# Patient Record
Sex: Male | Born: 1955 | Race: White | Hispanic: No | Marital: Married | Smoking: Never smoker
Health system: Southern US, Community
[De-identification: ages and names within clinical notes are randomized; demographics above are authoritative.]

## PROBLEM LIST (undated history)

## (undated) DIAGNOSIS — I712 Thoracic aortic aneurysm, without rupture: Secondary | ICD-10-CM

## (undated) DIAGNOSIS — Z85850 Personal history of malignant neoplasm of thyroid: Secondary | ICD-10-CM

## (undated) DIAGNOSIS — H9190 Unspecified hearing loss, unspecified ear: Secondary | ICD-10-CM

## (undated) DIAGNOSIS — I739 Peripheral vascular disease, unspecified: Secondary | ICD-10-CM

## (undated) DIAGNOSIS — C61 Malignant neoplasm of prostate: Secondary | ICD-10-CM

## (undated) DIAGNOSIS — H18069 Stromal corneal pigmentations, unspecified eye: Secondary | ICD-10-CM

## (undated) DIAGNOSIS — D839 Common variable immunodeficiency, unspecified: Secondary | ICD-10-CM

## (undated) DIAGNOSIS — N401 Enlarged prostate with lower urinary tract symptoms: Secondary | ICD-10-CM

## (undated) DIAGNOSIS — Z973 Presence of spectacles and contact lenses: Secondary | ICD-10-CM

## (undated) DIAGNOSIS — H3530 Unspecified macular degeneration: Secondary | ICD-10-CM

## (undated) DIAGNOSIS — G5620 Lesion of ulnar nerve, unspecified upper limb: Secondary | ICD-10-CM

## (undated) DIAGNOSIS — C801 Malignant (primary) neoplasm, unspecified: Secondary | ICD-10-CM

## (undated) DIAGNOSIS — I2510 Atherosclerotic heart disease of native coronary artery without angina pectoris: Secondary | ICD-10-CM

## (undated) DIAGNOSIS — K7689 Other specified diseases of liver: Secondary | ICD-10-CM

## (undated) DIAGNOSIS — E785 Hyperlipidemia, unspecified: Secondary | ICD-10-CM

## (undated) DIAGNOSIS — R351 Nocturia: Secondary | ICD-10-CM

## (undated) DIAGNOSIS — H269 Unspecified cataract: Secondary | ICD-10-CM

## (undated) DIAGNOSIS — D649 Anemia, unspecified: Secondary | ICD-10-CM

## (undated) DIAGNOSIS — T4145XA Adverse effect of unspecified anesthetic, initial encounter: Secondary | ICD-10-CM

## (undated) DIAGNOSIS — Z8709 Personal history of other diseases of the respiratory system: Secondary | ICD-10-CM

## (undated) DIAGNOSIS — R3911 Hesitancy of micturition: Secondary | ICD-10-CM

## (undated) DIAGNOSIS — M1990 Unspecified osteoarthritis, unspecified site: Secondary | ICD-10-CM

## (undated) DIAGNOSIS — Z85840 Personal history of malignant neoplasm of eye: Secondary | ICD-10-CM

## (undated) DIAGNOSIS — Z87442 Personal history of urinary calculi: Secondary | ICD-10-CM

## (undated) DIAGNOSIS — S52123A Displaced fracture of head of unspecified radius, initial encounter for closed fracture: Secondary | ICD-10-CM

## (undated) DIAGNOSIS — Z87438 Personal history of other diseases of male genital organs: Secondary | ICD-10-CM

## (undated) DIAGNOSIS — R2681 Unsteadiness on feet: Secondary | ICD-10-CM

## (undated) DIAGNOSIS — H409 Unspecified glaucoma: Secondary | ICD-10-CM

## (undated) DIAGNOSIS — F319 Bipolar disorder, unspecified: Secondary | ICD-10-CM

## (undated) DIAGNOSIS — I10 Essential (primary) hypertension: Secondary | ICD-10-CM

## (undated) DIAGNOSIS — K219 Gastro-esophageal reflux disease without esophagitis: Secondary | ICD-10-CM

## (undated) DIAGNOSIS — IMO0002 Reserved for concepts with insufficient information to code with codable children: Secondary | ICD-10-CM

## (undated) DIAGNOSIS — F419 Anxiety disorder, unspecified: Secondary | ICD-10-CM

## (undated) DIAGNOSIS — H9319 Tinnitus, unspecified ear: Secondary | ICD-10-CM

## (undated) DIAGNOSIS — T8859XA Other complications of anesthesia, initial encounter: Secondary | ICD-10-CM

## (undated) DIAGNOSIS — G8929 Other chronic pain: Secondary | ICD-10-CM

## (undated) DIAGNOSIS — M549 Dorsalgia, unspecified: Secondary | ICD-10-CM

## (undated) DIAGNOSIS — Z8719 Personal history of other diseases of the digestive system: Secondary | ICD-10-CM

## (undated) DIAGNOSIS — K862 Cyst of pancreas: Secondary | ICD-10-CM

## (undated) DIAGNOSIS — N138 Other obstructive and reflux uropathy: Secondary | ICD-10-CM

## (undated) DIAGNOSIS — F329 Major depressive disorder, single episode, unspecified: Secondary | ICD-10-CM

## (undated) DIAGNOSIS — F32A Depression, unspecified: Secondary | ICD-10-CM

## (undated) DIAGNOSIS — K274 Chronic or unspecified peptic ulcer, site unspecified, with hemorrhage: Secondary | ICD-10-CM

## (undated) HISTORY — DX: Malignant (primary) neoplasm, unspecified: C80.1

## (undated) HISTORY — DX: Anxiety disorder, unspecified: F41.9

## (undated) HISTORY — DX: Depression, unspecified: F32.A

## (undated) HISTORY — DX: Chronic or unspecified peptic ulcer, site unspecified, with hemorrhage: K27.4

## (undated) HISTORY — PX: CATARACT EXTRACTION: SUR2

## (undated) HISTORY — DX: Common variable immunodeficiency, unspecified: D83.9

## (undated) HISTORY — DX: Major depressive disorder, single episode, unspecified: F32.9

## (undated) HISTORY — DX: Hyperlipidemia, unspecified: E78.5

## (undated) HISTORY — PX: CARDIAC CATHETERIZATION: SHX172

## (undated) HISTORY — DX: Essential (primary) hypertension: I10

## (undated) HISTORY — PX: OTHER SURGICAL HISTORY: SHX169

## (undated) HISTORY — PX: SHOULDER ARTHROSCOPY: SHX128

## (undated) HISTORY — DX: Gastro-esophageal reflux disease without esophagitis: K21.9

---

## 1997-04-12 DIAGNOSIS — D229 Melanocytic nevi, unspecified: Secondary | ICD-10-CM

## 1997-04-12 HISTORY — DX: Melanocytic nevi, unspecified: D22.9

## 1999-01-19 ENCOUNTER — Observation Stay (HOSPITAL_COMMUNITY): Admission: EM | Admit: 1999-01-19 | Discharge: 1999-01-20 | Payer: Self-pay | Admitting: Emergency Medicine

## 1999-01-19 ENCOUNTER — Encounter: Payer: Self-pay | Admitting: Urology

## 1999-06-07 ENCOUNTER — Encounter: Payer: Self-pay | Admitting: Gastroenterology

## 1999-06-07 ENCOUNTER — Ambulatory Visit (HOSPITAL_COMMUNITY): Admission: RE | Admit: 1999-06-07 | Discharge: 1999-06-07 | Payer: Self-pay | Admitting: Gastroenterology

## 2000-08-12 HISTORY — PX: MOHS SURGERY: SUR867

## 2000-08-12 HISTORY — PX: OTHER SURGICAL HISTORY: SHX169

## 2001-07-23 ENCOUNTER — Encounter: Payer: Self-pay | Admitting: Oncology

## 2001-07-23 ENCOUNTER — Ambulatory Visit (HOSPITAL_COMMUNITY): Admission: RE | Admit: 2001-07-23 | Discharge: 2001-07-23 | Payer: Self-pay | Admitting: Oncology

## 2001-07-23 ENCOUNTER — Encounter (INDEPENDENT_AMBULATORY_CARE_PROVIDER_SITE_OTHER): Payer: Self-pay | Admitting: *Deleted

## 2001-07-24 ENCOUNTER — Encounter: Payer: Self-pay | Admitting: Oncology

## 2001-07-24 ENCOUNTER — Ambulatory Visit (HOSPITAL_COMMUNITY): Admission: RE | Admit: 2001-07-24 | Discharge: 2001-07-24 | Payer: Self-pay | Admitting: Oncology

## 2001-07-28 ENCOUNTER — Ambulatory Visit: Admission: RE | Admit: 2001-07-28 | Discharge: 2001-08-11 | Payer: Self-pay | Admitting: Radiation Oncology

## 2001-08-12 ENCOUNTER — Ambulatory Visit: Admission: RE | Admit: 2001-08-12 | Discharge: 2001-11-10 | Payer: Self-pay | Admitting: Radiation Oncology

## 2001-10-30 ENCOUNTER — Encounter: Payer: Self-pay | Admitting: Surgery

## 2001-11-03 ENCOUNTER — Encounter (INDEPENDENT_AMBULATORY_CARE_PROVIDER_SITE_OTHER): Payer: Self-pay | Admitting: Specialist

## 2001-11-03 HISTORY — PX: TOTAL THYROIDECTOMY: SHX2547

## 2001-11-04 ENCOUNTER — Inpatient Hospital Stay (HOSPITAL_COMMUNITY): Admission: RE | Admit: 2001-11-04 | Discharge: 2001-11-05 | Payer: Self-pay | Admitting: Surgery

## 2001-11-30 ENCOUNTER — Encounter: Payer: Self-pay | Admitting: Endocrinology

## 2001-11-30 ENCOUNTER — Encounter (HOSPITAL_COMMUNITY): Admission: RE | Admit: 2001-11-30 | Discharge: 2002-02-28 | Payer: Self-pay | Admitting: Endocrinology

## 2001-12-10 ENCOUNTER — Encounter: Payer: Self-pay | Admitting: Endocrinology

## 2003-02-22 ENCOUNTER — Other Ambulatory Visit: Admission: RE | Admit: 2003-02-22 | Discharge: 2003-02-22 | Payer: Self-pay | Admitting: Otolaryngology

## 2003-03-08 ENCOUNTER — Inpatient Hospital Stay (HOSPITAL_COMMUNITY): Admission: RE | Admit: 2003-03-08 | Discharge: 2003-03-10 | Payer: Self-pay | Admitting: Otolaryngology

## 2003-03-08 ENCOUNTER — Encounter (INDEPENDENT_AMBULATORY_CARE_PROVIDER_SITE_OTHER): Payer: Self-pay | Admitting: *Deleted

## 2003-03-08 HISTORY — PX: OTHER SURGICAL HISTORY: SHX169

## 2003-03-16 ENCOUNTER — Ambulatory Visit: Admission: RE | Admit: 2003-03-16 | Discharge: 2003-06-14 | Payer: Self-pay | Admitting: Radiation Oncology

## 2003-04-04 ENCOUNTER — Encounter: Admission: RE | Admit: 2003-04-04 | Discharge: 2003-04-04 | Payer: Self-pay | Admitting: Dentistry

## 2003-04-22 ENCOUNTER — Ambulatory Visit (HOSPITAL_COMMUNITY): Admission: RE | Admit: 2003-04-22 | Discharge: 2003-04-22 | Payer: Self-pay | Admitting: Radiation Oncology

## 2003-05-10 ENCOUNTER — Ambulatory Visit (HOSPITAL_COMMUNITY): Admission: RE | Admit: 2003-05-10 | Discharge: 2003-05-10 | Payer: Self-pay | Admitting: Radiation Oncology

## 2003-05-30 ENCOUNTER — Inpatient Hospital Stay (HOSPITAL_COMMUNITY): Admission: EM | Admit: 2003-05-30 | Discharge: 2003-06-10 | Payer: Self-pay | Admitting: Emergency Medicine

## 2003-05-30 ENCOUNTER — Encounter: Payer: Self-pay | Admitting: Emergency Medicine

## 2003-06-17 ENCOUNTER — Ambulatory Visit: Admission: RE | Admit: 2003-06-17 | Discharge: 2003-06-17 | Payer: Self-pay | Admitting: Radiation Oncology

## 2003-06-24 ENCOUNTER — Ambulatory Visit: Admission: RE | Admit: 2003-06-24 | Discharge: 2003-06-24 | Payer: Self-pay | Admitting: Radiation Oncology

## 2003-07-04 ENCOUNTER — Ambulatory Visit: Admission: RE | Admit: 2003-07-04 | Discharge: 2003-07-04 | Payer: Self-pay | Admitting: Radiation Oncology

## 2003-07-05 ENCOUNTER — Ambulatory Visit: Admission: RE | Admit: 2003-07-05 | Discharge: 2003-07-05 | Payer: Self-pay | Admitting: Radiation Oncology

## 2003-07-18 ENCOUNTER — Ambulatory Visit: Admission: RE | Admit: 2003-07-18 | Discharge: 2003-07-18 | Payer: Self-pay | Admitting: Radiation Oncology

## 2003-07-25 ENCOUNTER — Inpatient Hospital Stay (HOSPITAL_COMMUNITY): Admission: RE | Admit: 2003-07-25 | Discharge: 2003-07-26 | Payer: Self-pay | Admitting: Psychiatry

## 2003-08-09 ENCOUNTER — Ambulatory Visit (HOSPITAL_COMMUNITY): Admission: RE | Admit: 2003-08-09 | Discharge: 2003-08-09 | Payer: Self-pay | Admitting: Oncology

## 2003-08-15 ENCOUNTER — Ambulatory Visit: Admission: RE | Admit: 2003-08-15 | Discharge: 2003-08-15 | Payer: Self-pay | Admitting: Radiation Oncology

## 2003-11-14 ENCOUNTER — Ambulatory Visit: Admission: RE | Admit: 2003-11-14 | Discharge: 2003-11-14 | Payer: Self-pay | Admitting: Radiation Oncology

## 2004-04-10 ENCOUNTER — Encounter: Admission: RE | Admit: 2004-04-10 | Discharge: 2004-04-10 | Payer: Self-pay | Admitting: Oncology

## 2004-06-17 ENCOUNTER — Ambulatory Visit: Payer: Self-pay | Admitting: Oncology

## 2004-08-12 HISTORY — PX: OTHER SURGICAL HISTORY: SHX169

## 2004-09-18 ENCOUNTER — Ambulatory Visit: Payer: Self-pay | Admitting: Internal Medicine

## 2004-09-25 ENCOUNTER — Observation Stay (HOSPITAL_COMMUNITY): Admission: EM | Admit: 2004-09-25 | Discharge: 2004-09-26 | Payer: Self-pay | Admitting: Orthopedic Surgery

## 2004-10-09 ENCOUNTER — Ambulatory Visit: Payer: Self-pay | Admitting: *Deleted

## 2004-10-17 ENCOUNTER — Ambulatory Visit: Payer: Self-pay

## 2004-10-17 ENCOUNTER — Ambulatory Visit: Payer: Self-pay | Admitting: *Deleted

## 2004-10-25 ENCOUNTER — Ambulatory Visit: Payer: Self-pay | Admitting: Oncology

## 2004-10-30 ENCOUNTER — Ambulatory Visit: Payer: Self-pay | Admitting: *Deleted

## 2004-11-06 ENCOUNTER — Ambulatory Visit (HOSPITAL_COMMUNITY): Admission: RE | Admit: 2004-11-06 | Discharge: 2004-11-07 | Payer: Self-pay | Admitting: Ophthalmology

## 2004-11-06 ENCOUNTER — Encounter (INDEPENDENT_AMBULATORY_CARE_PROVIDER_SITE_OTHER): Payer: Self-pay | Admitting: *Deleted

## 2004-11-06 HISTORY — PX: PARS PLANA VITRECTOMY: SHX2166

## 2004-11-29 ENCOUNTER — Ambulatory Visit: Payer: Self-pay | Admitting: Gastroenterology

## 2005-03-20 ENCOUNTER — Ambulatory Visit: Payer: Self-pay | Admitting: Oncology

## 2005-03-25 ENCOUNTER — Ambulatory Visit (HOSPITAL_COMMUNITY): Admission: RE | Admit: 2005-03-25 | Discharge: 2005-03-25 | Payer: Self-pay | Admitting: Otolaryngology

## 2005-03-29 ENCOUNTER — Ambulatory Visit (HOSPITAL_COMMUNITY): Admission: RE | Admit: 2005-03-29 | Discharge: 2005-03-30 | Payer: Self-pay | Admitting: Urology

## 2005-03-29 HISTORY — PX: OTHER SURGICAL HISTORY: SHX169

## 2005-05-28 ENCOUNTER — Ambulatory Visit: Payer: Self-pay | Admitting: Gastroenterology

## 2005-05-28 ENCOUNTER — Encounter (INDEPENDENT_AMBULATORY_CARE_PROVIDER_SITE_OTHER): Payer: Self-pay | Admitting: Specialist

## 2005-06-12 HISTORY — PX: SEPTOPLASTY: SUR1290

## 2005-07-08 ENCOUNTER — Ambulatory Visit: Payer: Self-pay | Admitting: Gastroenterology

## 2005-07-12 ENCOUNTER — Ambulatory Visit: Payer: Self-pay | Admitting: Oncology

## 2005-07-18 ENCOUNTER — Ambulatory Visit: Payer: Self-pay | Admitting: Gastroenterology

## 2005-08-02 ENCOUNTER — Ambulatory Visit: Payer: Self-pay | Admitting: Internal Medicine

## 2005-08-07 ENCOUNTER — Encounter (INDEPENDENT_AMBULATORY_CARE_PROVIDER_SITE_OTHER): Payer: Self-pay | Admitting: Specialist

## 2005-08-07 ENCOUNTER — Observation Stay (HOSPITAL_COMMUNITY): Admission: EM | Admit: 2005-08-07 | Discharge: 2005-08-07 | Payer: Self-pay | Admitting: Emergency Medicine

## 2005-08-07 HISTORY — PX: NASAL ENDOSCOPY: SHX286

## 2005-10-30 ENCOUNTER — Ambulatory Visit: Payer: Self-pay | Admitting: Internal Medicine

## 2005-11-04 ENCOUNTER — Ambulatory Visit: Payer: Self-pay | Admitting: Internal Medicine

## 2006-01-08 ENCOUNTER — Ambulatory Visit: Payer: Self-pay | Admitting: Oncology

## 2006-01-08 LAB — COMPREHENSIVE METABOLIC PANEL
ALT: 31 U/L (ref 0–40)
AST: 18 U/L (ref 0–37)
Albumin: 4.3 g/dL (ref 3.5–5.2)
Alkaline Phosphatase: 49 U/L (ref 39–117)
BUN: 21 mg/dL (ref 6–23)
CO2: 26 mEq/L (ref 19–32)
Calcium: 8.2 mg/dL — ABNORMAL LOW (ref 8.4–10.5)
Chloride: 101 mEq/L (ref 96–112)
Creatinine, Ser: 1.1 mg/dL (ref 0.4–1.5)
Glucose, Bld: 93 mg/dL (ref 70–99)
Potassium: 3.8 mEq/L (ref 3.5–5.3)
Sodium: 138 mEq/L (ref 135–145)
Total Bilirubin: 0.8 mg/dL (ref 0.3–1.2)
Total Protein: 6.9 g/dL (ref 6.0–8.3)

## 2006-01-08 LAB — CBC WITH DIFFERENTIAL/PLATELET
BASO%: 0.4 % (ref 0.0–2.0)
Basophils Absolute: 0 10*3/uL (ref 0.0–0.1)
EOS%: 0.5 % (ref 0.0–7.0)
Eosinophils Absolute: 0.1 10*3/uL (ref 0.0–0.5)
HCT: 49.4 % (ref 38.7–49.9)
HGB: 16.6 g/dL (ref 13.0–17.1)
LYMPH%: 15.5 % (ref 14.0–48.0)
MCH: 26.6 pg — ABNORMAL LOW (ref 28.0–33.4)
MCHC: 33.5 g/dL (ref 32.0–35.9)
MCV: 79.4 fL — ABNORMAL LOW (ref 81.6–98.0)
MONO#: 0.7 10*3/uL (ref 0.1–0.9)
MONO%: 6.4 % (ref 0.0–13.0)
NEUT#: 8.6 10*3/uL — ABNORMAL HIGH (ref 1.5–6.5)
NEUT%: 77.2 % — ABNORMAL HIGH (ref 40.0–75.0)
Platelets: 281 10*3/uL (ref 145–400)
RBC: 6.22 10*6/uL — ABNORMAL HIGH (ref 4.20–5.71)
RDW: 17 % — ABNORMAL HIGH (ref 11.2–14.6)
WBC: 11.1 10*3/uL — ABNORMAL HIGH (ref 4.0–10.0)
lymph#: 1.7 10*3/uL (ref 0.9–3.3)

## 2006-01-08 LAB — LIPID PANEL
Cholesterol: 248 mg/dL — ABNORMAL HIGH (ref 0–200)
HDL: 70 mg/dL (ref >39)
LDL Cholesterol: 154 mg/dL — ABNORMAL HIGH (ref 0–99)
Total CHOL/HDL Ratio: 3.5 Ratio
Triglycerides: 121 mg/dL (ref <150)
VLDL: 24 mg/dL (ref 0–40)

## 2006-01-08 LAB — TSH: TSH: 0.461 u[IU]/mL (ref 0.350–5.500)

## 2006-01-08 LAB — LACTATE DEHYDROGENASE: LDH: 216 U/L (ref 94–250)

## 2006-01-08 LAB — TESTOSTERONE: Testosterone: 591.84 ng/dL (ref 350–890)

## 2006-01-09 ENCOUNTER — Ambulatory Visit (HOSPITAL_COMMUNITY): Admission: RE | Admit: 2006-01-09 | Discharge: 2006-01-09 | Payer: Self-pay | Admitting: Oncology

## 2006-01-15 ENCOUNTER — Inpatient Hospital Stay (HOSPITAL_COMMUNITY): Admission: EM | Admit: 2006-01-15 | Discharge: 2006-01-16 | Payer: Self-pay | Admitting: Emergency Medicine

## 2006-01-15 ENCOUNTER — Ambulatory Visit: Payer: Self-pay | Admitting: Cardiology

## 2006-01-15 ENCOUNTER — Ambulatory Visit: Payer: Self-pay | Admitting: Gastroenterology

## 2006-01-30 ENCOUNTER — Ambulatory Visit: Payer: Self-pay | Admitting: Gastroenterology

## 2006-02-05 ENCOUNTER — Ambulatory Visit: Payer: Self-pay | Admitting: *Deleted

## 2006-02-24 ENCOUNTER — Ambulatory Visit: Payer: Self-pay | Admitting: Internal Medicine

## 2006-02-25 ENCOUNTER — Ambulatory Visit: Payer: Self-pay | Admitting: *Deleted

## 2006-04-02 ENCOUNTER — Ambulatory Visit: Payer: Self-pay | Admitting: Internal Medicine

## 2006-06-03 ENCOUNTER — Ambulatory Visit: Payer: Self-pay | Admitting: *Deleted

## 2006-06-04 ENCOUNTER — Ambulatory Visit: Payer: Self-pay | Admitting: *Deleted

## 2006-06-04 LAB — CONVERTED CEMR LAB
ALT: 28 units/L (ref 0–40)
AST: 27 units/L (ref 0–37)
Albumin: 3.9 g/dL (ref 3.5–5.2)
Alkaline Phosphatase: 54 units/L (ref 39–117)
BUN: 14 mg/dL (ref 6–23)
Bilirubin, Direct: 0.2 mg/dL (ref 0.0–0.3)
CO2: 29 meq/L (ref 19–32)
Calcium: 8.8 mg/dL (ref 8.4–10.5)
Chloride: 103 meq/L (ref 96–112)
Chol/HDL Ratio, serum: 3.4
Cholesterol: 178 mg/dL (ref 0–200)
Creatinine, Ser: 1.2 mg/dL (ref 0.4–1.5)
GFR calc non Af Amer: 68 mL/min
Glomerular Filtration Rate, Af Am: 82 mL/min/{1.73_m2}
Glucose, Bld: 95 mg/dL (ref 70–99)
HDL: 52.4 mg/dL (ref >39.0)
LDL Cholesterol: 107 mg/dL — ABNORMAL HIGH (ref 0–99)
Potassium: 4.3 meq/L (ref 3.5–5.1)
Sodium: 138 meq/L (ref 135–145)
TSH: 1.3 microintl units/mL (ref 0.35–5.50)
Testosterone, total: 4.3027 ng/mL
Total Bilirubin: 0.9 mg/dL (ref 0.3–1.2)
Total Protein: 6.8 g/dL (ref 6.0–8.3)
Triglyceride fasting, serum: 95 mg/dL (ref 0–149)
VLDL: 19 mg/dL (ref 0–40)

## 2006-07-07 ENCOUNTER — Ambulatory Visit: Payer: Self-pay | Admitting: Oncology

## 2006-07-16 LAB — COMPREHENSIVE METABOLIC PANEL
ALT: 31 U/L (ref 0–53)
AST: 23 U/L (ref 0–37)
Albumin: 4.5 g/dL (ref 3.5–5.2)
Alkaline Phosphatase: 63 U/L (ref 39–117)
BUN: 21 mg/dL (ref 6–23)
CO2: 25 mEq/L (ref 19–32)
Calcium: 9.2 mg/dL (ref 8.4–10.5)
Chloride: 105 mEq/L (ref 96–112)
Creatinine, Ser: 1.12 mg/dL (ref 0.40–1.50)
Glucose, Bld: 105 mg/dL — ABNORMAL HIGH (ref 70–99)
Potassium: 4.2 mEq/L (ref 3.5–5.3)
Sodium: 140 mEq/L (ref 135–145)
Total Bilirubin: 0.6 mg/dL (ref 0.3–1.2)
Total Protein: 7 g/dL (ref 6.0–8.3)

## 2006-07-16 LAB — CBC WITH DIFFERENTIAL/PLATELET
BASO%: 0.2 % (ref 0.0–2.0)
Basophils Absolute: 0 10*3/uL (ref 0.0–0.1)
EOS%: 3.6 % (ref 0.0–7.0)
Eosinophils Absolute: 0.3 10*3/uL (ref 0.0–0.5)
HCT: 48.9 % (ref 38.7–49.9)
HGB: 16.5 g/dL (ref 13.0–17.1)
LYMPH%: 19.6 % (ref 14.0–48.0)
MCH: 27.4 pg — ABNORMAL LOW (ref 28.0–33.4)
MCHC: 33.7 g/dL (ref 32.0–35.9)
MCV: 81.2 fL — ABNORMAL LOW (ref 81.6–98.0)
MONO#: 0.9 10*3/uL (ref 0.1–0.9)
MONO%: 9.9 % (ref 0.0–13.0)
NEUT#: 5.8 10*3/uL (ref 1.5–6.5)
NEUT%: 66.7 % (ref 40.0–75.0)
Platelets: 348 10*3/uL (ref 145–400)
RBC: 6.03 10*6/uL — ABNORMAL HIGH (ref 4.20–5.71)
RDW: 14.7 % — ABNORMAL HIGH (ref 11.2–14.6)
WBC: 8.7 10*3/uL (ref 4.0–10.0)
lymph#: 1.7 10*3/uL (ref 0.9–3.3)

## 2006-07-16 LAB — TESTOSTERONE: Testosterone: 208.32 ng/dL — ABNORMAL LOW (ref 350–890)

## 2006-07-16 LAB — TSH: TSH: 0.739 u[IU]/mL (ref 0.350–5.500)

## 2006-10-13 ENCOUNTER — Ambulatory Visit: Payer: Self-pay | Admitting: Internal Medicine

## 2006-10-13 LAB — CONVERTED CEMR LAB
ALT: 40 units/L (ref 0–40)
AST: 36 units/L (ref 0–37)
Albumin: 4 g/dL (ref 3.5–5.2)
Alkaline Phosphatase: 58 units/L (ref 39–117)
BUN: 13 mg/dL (ref 6–23)
Basophils Absolute: 0.1 10*3/uL (ref 0.0–0.1)
Basophils Relative: 0.9 % (ref 0.0–1.0)
Bilirubin Urine: NEGATIVE
Bilirubin, Direct: 0.2 mg/dL (ref 0.0–0.3)
CO2: 30 meq/L (ref 19–32)
Calcium: 8.7 mg/dL (ref 8.4–10.5)
Chloride: 106 meq/L (ref 96–112)
Cholesterol: 176 mg/dL (ref 0–200)
Creatinine, Ser: 1 mg/dL (ref 0.4–1.5)
Eosinophils Absolute: 0.4 10*3/uL (ref 0.0–0.6)
Eosinophils Relative: 5 % (ref 0.0–5.0)
GFR calc Af Amer: 101 mL/min
GFR calc non Af Amer: 83 mL/min
Glucose, Bld: 99 mg/dL (ref 70–99)
HCT: 51.1 % (ref 39.0–52.0)
HDL: 55.8 mg/dL (ref >39.0)
Hemoglobin: 16.8 g/dL (ref 13.0–17.0)
Ketones, ur: NEGATIVE mg/dL
LDL Cholesterol: 99 mg/dL (ref 0–99)
Leukocytes, UA: NEGATIVE
Lymphocytes Relative: 23.1 % (ref 12.0–46.0)
MCHC: 32.9 g/dL (ref 30.0–36.0)
MCV: 81.7 fL (ref 78.0–100.0)
Monocytes Absolute: 1.1 10*3/uL — ABNORMAL HIGH (ref 0.2–0.7)
Monocytes Relative: 12 % — ABNORMAL HIGH (ref 3.0–11.0)
Neutro Abs: 5.2 10*3/uL (ref 1.4–7.7)
Neutrophils Relative %: 59 % (ref 43.0–77.0)
Nitrite: NEGATIVE
PSA: 4.08 ng/mL — ABNORMAL HIGH (ref 0.10–4.00)
Platelets: 308 10*3/uL (ref 150–400)
Potassium: 4.3 meq/L (ref 3.5–5.1)
RBC: 6.25 M/uL — ABNORMAL HIGH (ref 4.22–5.81)
RDW: 15.9 % — ABNORMAL HIGH (ref 11.5–14.6)
Sodium: 142 meq/L (ref 135–145)
Specific Gravity, Urine: >=1.03 (ref 1.000–1.03)
TSH: 2.42 microintl units/mL (ref 0.35–5.50)
Testosterone: 310.79 ng/dL — ABNORMAL LOW (ref 350.00–890)
Total Bilirubin: 0.9 mg/dL (ref 0.3–1.2)
Total CHOL/HDL Ratio: 3.2
Total Protein, Urine: NEGATIVE mg/dL
Total Protein: 7 g/dL (ref 6.0–8.3)
Triglycerides: 106 mg/dL (ref 0–149)
Urine Glucose: NEGATIVE mg/dL
Urobilinogen, UA: 0.2 (ref 0.0–1.0)
VLDL: 21 mg/dL (ref 0–40)
WBC: 8.8 10*3/uL (ref 4.5–10.5)
pH: 6 (ref 5.0–8.0)

## 2006-10-20 ENCOUNTER — Ambulatory Visit: Payer: Self-pay | Admitting: Internal Medicine

## 2006-10-20 LAB — CONVERTED CEMR LAB
Rhuematoid fact SerPl-aCnc: <20 intl units/mL — ABNORMAL LOW (ref 0.0–20.0)
Sed Rate: 1 mm/hr (ref 0–20)
Uric Acid, Serum: 6 mg/dL (ref 2.4–7.0)

## 2006-10-22 ENCOUNTER — Ambulatory Visit: Payer: Self-pay | Admitting: Oncology

## 2006-10-30 ENCOUNTER — Ambulatory Visit (HOSPITAL_COMMUNITY): Admission: RE | Admit: 2006-10-30 | Discharge: 2006-10-30 | Payer: Self-pay | Admitting: Oncology

## 2007-01-02 ENCOUNTER — Ambulatory Visit: Payer: Self-pay | Admitting: Oncology

## 2007-01-21 ENCOUNTER — Emergency Department (HOSPITAL_COMMUNITY): Admission: EM | Admit: 2007-01-21 | Discharge: 2007-01-21 | Payer: Self-pay | Admitting: Emergency Medicine

## 2007-01-26 ENCOUNTER — Encounter: Admission: RE | Admit: 2007-01-26 | Discharge: 2007-01-26 | Payer: Self-pay | Admitting: Orthopedic Surgery

## 2007-02-03 LAB — CBC WITH DIFFERENTIAL/PLATELET
BASO%: 0.4 % (ref 0.0–2.0)
Basophils Absolute: 0 10*3/uL (ref 0.0–0.1)
EOS%: 1.6 % (ref 0.0–7.0)
Eosinophils Absolute: 0.2 10*3/uL (ref 0.0–0.5)
HCT: 49.1 % (ref 38.7–49.9)
HGB: 16.6 g/dL (ref 13.0–17.1)
LYMPH%: 16.7 % (ref 14.0–48.0)
MCH: 27.6 pg — ABNORMAL LOW (ref 28.0–33.4)
MCHC: 33.7 g/dL (ref 32.0–35.9)
MCV: 81.9 fL (ref 81.6–98.0)
MONO#: 0.8 10*3/uL (ref 0.1–0.9)
MONO%: 8.4 % (ref 0.0–13.0)
NEUT#: 6.9 10*3/uL — ABNORMAL HIGH (ref 1.5–6.5)
NEUT%: 72.9 % (ref 40.0–75.0)
Platelets: 285 10*3/uL (ref 145–400)
RBC: 6 10*6/uL — ABNORMAL HIGH (ref 4.20–5.71)
RDW: 15.5 % — ABNORMAL HIGH (ref 11.2–14.6)
WBC: 9.5 10*3/uL (ref 4.0–10.0)
lymph#: 1.6 10*3/uL (ref 0.9–3.3)

## 2007-02-03 LAB — TESTOSTERONE: Testosterone: 971.68 ng/dL — ABNORMAL HIGH (ref 350–890)

## 2007-02-03 LAB — TSH: TSH: 2.851 u[IU]/mL (ref 0.350–5.500)

## 2007-03-05 ENCOUNTER — Ambulatory Visit: Payer: Self-pay | Admitting: Cardiology

## 2007-03-05 LAB — CONVERTED CEMR LAB
ALT: 43 units/L (ref 0–53)
AST: 28 units/L (ref 0–37)
Albumin: 3.9 g/dL (ref 3.5–5.2)
Alkaline Phosphatase: 51 units/L (ref 39–117)
Bilirubin, Direct: 0.1 mg/dL (ref 0.0–0.3)
Cholesterol: 212 mg/dL (ref 0–200)
Direct LDL: 134.7 mg/dL
HDL: 54.4 mg/dL (ref >39.0)
Total Bilirubin: 0.9 mg/dL (ref 0.3–1.2)
Total CHOL/HDL Ratio: 3.9
Total Protein: 6.5 g/dL (ref 6.0–8.3)
Triglycerides: 74 mg/dL (ref 0–149)
VLDL: 15 mg/dL (ref 0–40)

## 2007-03-10 ENCOUNTER — Ambulatory Visit: Payer: Self-pay | Admitting: Cardiology

## 2007-03-20 ENCOUNTER — Ambulatory Visit: Payer: Self-pay | Admitting: Internal Medicine

## 2007-04-07 ENCOUNTER — Ambulatory Visit: Payer: Self-pay | Admitting: Oncology

## 2007-04-09 LAB — COMPREHENSIVE METABOLIC PANEL
ALT: 24 U/L (ref 0–53)
AST: 20 U/L (ref 0–37)
Albumin: 4.5 g/dL (ref 3.5–5.2)
Alkaline Phosphatase: 52 U/L (ref 39–117)
BUN: 18 mg/dL (ref 6–23)
CO2: 25 mEq/L (ref 19–32)
Calcium: 8.4 mg/dL (ref 8.4–10.5)
Chloride: 103 mEq/L (ref 96–112)
Creatinine, Ser: 1.07 mg/dL (ref 0.40–1.50)
Glucose, Bld: 90 mg/dL (ref 70–99)
Potassium: 4.2 mEq/L (ref 3.5–5.3)
Sodium: 138 mEq/L (ref 135–145)
Total Bilirubin: 0.9 mg/dL (ref 0.3–1.2)
Total Protein: 6.7 g/dL (ref 6.0–8.3)

## 2007-04-09 LAB — MORPHOLOGY: PLT EST: ADEQUATE

## 2007-04-09 LAB — TESTOSTERONE: Testosterone: 608.05 ng/dL (ref 350–890)

## 2007-04-09 LAB — CBC WITH DIFFERENTIAL/PLATELET
BASO%: 0.3 % (ref 0.0–2.0)
Basophils Absolute: 0 10*3/uL (ref 0.0–0.1)
EOS%: 1.3 % (ref 0.0–7.0)
Eosinophils Absolute: 0.1 10*3/uL (ref 0.0–0.5)
HCT: 44.2 % (ref 38.7–49.9)
HGB: 15.1 g/dL (ref 13.0–17.1)
LYMPH%: 23.2 % (ref 14.0–48.0)
MCH: 27.8 pg — ABNORMAL LOW (ref 28.0–33.4)
MCHC: 34.1 g/dL (ref 32.0–35.9)
MCV: 81.6 fL (ref 81.6–98.0)
MONO#: 0.8 10*3/uL (ref 0.1–0.9)
MONO%: 11.9 % (ref 0.0–13.0)
NEUT#: 4.5 10*3/uL (ref 1.5–6.5)
NEUT%: 63.3 % (ref 40.0–75.0)
Platelets: 292 10*3/uL (ref 145–400)
RBC: 5.41 10*6/uL (ref 4.20–5.71)
RDW: 16.9 % — ABNORMAL HIGH (ref 11.2–14.6)
WBC: 7.1 10*3/uL (ref 4.0–10.0)
lymph#: 1.7 10*3/uL (ref 0.9–3.3)

## 2007-04-09 LAB — TSH: TSH: 2.447 u[IU]/mL (ref 0.350–5.500)

## 2007-04-20 ENCOUNTER — Ambulatory Visit: Payer: Self-pay | Admitting: Internal Medicine

## 2007-04-29 ENCOUNTER — Ambulatory Visit: Payer: Self-pay | Admitting: Internal Medicine

## 2007-05-12 ENCOUNTER — Ambulatory Visit (HOSPITAL_BASED_OUTPATIENT_CLINIC_OR_DEPARTMENT_OTHER): Admission: RE | Admit: 2007-05-12 | Discharge: 2007-05-12 | Payer: Self-pay | Admitting: Orthopedic Surgery

## 2007-05-12 HISTORY — PX: OTHER SURGICAL HISTORY: SHX169

## 2007-06-11 ENCOUNTER — Ambulatory Visit: Payer: Self-pay | Admitting: Licensed Clinical Social Worker

## 2007-07-20 ENCOUNTER — Encounter: Admission: RE | Admit: 2007-07-20 | Discharge: 2007-07-20 | Payer: Self-pay | Admitting: Orthopedic Surgery

## 2007-08-11 ENCOUNTER — Ambulatory Visit: Payer: Self-pay | Admitting: Cardiology

## 2007-08-11 LAB — CONVERTED CEMR LAB
ALT: 33 units/L (ref 0–53)
AST: 26 units/L (ref 0–37)
Albumin: 4 g/dL (ref 3.5–5.2)
Alkaline Phosphatase: 46 units/L (ref 39–117)
Bilirubin, Direct: 0.2 mg/dL (ref 0.0–0.3)
Cholesterol: 200 mg/dL (ref 0–200)
HDL: 62.1 mg/dL (ref >39.0)
LDL Cholesterol: 122 mg/dL — ABNORMAL HIGH (ref 0–99)
Total Bilirubin: 1 mg/dL (ref 0.3–1.2)
Total CHOL/HDL Ratio: 3.2
Total Protein: 7.2 g/dL (ref 6.0–8.3)
Triglycerides: 79 mg/dL (ref 0–149)
VLDL: 16 mg/dL (ref 0–40)

## 2007-09-18 ENCOUNTER — Emergency Department (HOSPITAL_COMMUNITY): Admission: EM | Admit: 2007-09-18 | Discharge: 2007-09-18 | Payer: Self-pay | Admitting: Emergency Medicine

## 2007-09-24 ENCOUNTER — Ambulatory Visit: Payer: Self-pay | Admitting: Internal Medicine

## 2007-09-24 DIAGNOSIS — J329 Chronic sinusitis, unspecified: Secondary | ICD-10-CM | POA: Insufficient documentation

## 2007-09-30 ENCOUNTER — Encounter: Payer: Self-pay | Admitting: Internal Medicine

## 2007-10-01 ENCOUNTER — Telehealth: Payer: Self-pay | Admitting: Internal Medicine

## 2007-10-02 ENCOUNTER — Ambulatory Visit: Payer: Self-pay | Admitting: Internal Medicine

## 2007-10-02 DIAGNOSIS — E291 Testicular hypofunction: Secondary | ICD-10-CM | POA: Insufficient documentation

## 2007-10-02 DIAGNOSIS — C73 Malignant neoplasm of thyroid gland: Secondary | ICD-10-CM | POA: Insufficient documentation

## 2007-10-02 DIAGNOSIS — D839 Common variable immunodeficiency, unspecified: Secondary | ICD-10-CM | POA: Insufficient documentation

## 2007-10-02 DIAGNOSIS — B370 Candidal stomatitis: Secondary | ICD-10-CM | POA: Insufficient documentation

## 2007-10-02 DIAGNOSIS — C6960 Malignant neoplasm of unspecified orbit: Secondary | ICD-10-CM | POA: Insufficient documentation

## 2007-10-02 DIAGNOSIS — R7309 Other abnormal glucose: Secondary | ICD-10-CM | POA: Insufficient documentation

## 2007-10-06 ENCOUNTER — Encounter: Payer: Self-pay | Admitting: Internal Medicine

## 2007-10-06 LAB — CONVERTED CEMR LAB
ALT: 47 units/L (ref 0–53)
AST: 34 units/L (ref 0–37)
Albumin: 4 g/dL (ref 3.5–5.2)
Alkaline Phosphatase: 48 units/L (ref 39–117)
BUN: 19 mg/dL (ref 6–23)
Basophils Absolute: 0 10*3/uL (ref 0.0–0.1)
Basophils Relative: 0 % (ref 0.0–1.0)
Bilirubin, Direct: 0.2 mg/dL (ref 0.0–0.3)
CO2: 30 meq/L (ref 19–32)
Calcium: 8.5 mg/dL (ref 8.4–10.5)
Chloride: 102 meq/L (ref 96–112)
Creatinine, Ser: 1.1 mg/dL (ref 0.4–1.5)
Eosinophils Absolute: 0.1 10*3/uL (ref 0.0–0.6)
Eosinophils Relative: 1 % (ref 0.0–5.0)
GFR calc Af Amer: 90 mL/min
GFR calc non Af Amer: 74 mL/min
Glucose, Bld: 90 mg/dL (ref 70–99)
HCT: 49.3 % (ref 39.0–52.0)
Hemoglobin: 16.1 g/dL (ref 13.0–17.0)
Lymphocytes Relative: 21 % (ref 12.0–46.0)
MCHC: 32.6 g/dL (ref 30.0–36.0)
MCV: 81.5 fL (ref 78.0–100.0)
Monocytes Absolute: 0.8 10*3/uL — ABNORMAL HIGH (ref 0.2–0.7)
Monocytes Relative: 8.4 % (ref 3.0–11.0)
Neutro Abs: 6.8 10*3/uL (ref 1.4–7.7)
Neutrophils Relative %: 69.6 % (ref 43.0–77.0)
PSA: 0.92 ng/mL (ref 0.10–4.00)
Platelets: 285 10*3/uL (ref 150–400)
Potassium: 4.2 meq/L (ref 3.5–5.1)
RBC: 6.05 M/uL — ABNORMAL HIGH (ref 4.22–5.81)
RDW: 15 % — ABNORMAL HIGH (ref 11.5–14.6)
Sodium: 139 meq/L (ref 135–145)
TSH: 0.45 microintl units/mL (ref 0.35–5.50)
Testosterone: 524.46 ng/dL (ref 350.00–890)
Total Bilirubin: 0.8 mg/dL (ref 0.3–1.2)
Total Protein: 6.5 g/dL (ref 6.0–8.3)
WBC: 9.8 10*3/uL (ref 4.5–10.5)

## 2007-10-08 ENCOUNTER — Encounter: Payer: Self-pay | Admitting: Internal Medicine

## 2007-10-09 ENCOUNTER — Ambulatory Visit (HOSPITAL_COMMUNITY): Admission: RE | Admit: 2007-10-09 | Discharge: 2007-10-09 | Payer: Self-pay | Admitting: Internal Medicine

## 2007-10-14 ENCOUNTER — Encounter: Payer: Self-pay | Admitting: Internal Medicine

## 2007-10-14 ENCOUNTER — Telehealth: Payer: Self-pay | Admitting: Internal Medicine

## 2007-10-20 ENCOUNTER — Ambulatory Visit: Payer: Self-pay | Admitting: Oncology

## 2007-10-29 LAB — COMPREHENSIVE METABOLIC PANEL
ALT: 28 U/L (ref 0–53)
AST: 23 U/L (ref 0–37)
Albumin: 4.3 g/dL (ref 3.5–5.2)
Alkaline Phosphatase: 51 U/L (ref 39–117)
BUN: 16 mg/dL (ref 6–23)
CO2: 25 mEq/L (ref 19–32)
Calcium: 9.1 mg/dL (ref 8.4–10.5)
Chloride: 104 mEq/L (ref 96–112)
Creatinine, Ser: 1.04 mg/dL (ref 0.40–1.50)
Glucose, Bld: 108 mg/dL — ABNORMAL HIGH (ref 70–99)
Potassium: 4.8 mEq/L (ref 3.5–5.3)
Sodium: 136 mEq/L (ref 135–145)
Total Bilirubin: 0.6 mg/dL (ref 0.3–1.2)
Total Protein: 7.1 g/dL (ref 6.0–8.3)

## 2007-10-29 LAB — CBC WITH DIFFERENTIAL/PLATELET
BASO%: 0.5 % (ref 0.0–2.0)
Basophils Absolute: 0 10*3/uL (ref 0.0–0.1)
EOS%: 2.2 % (ref 0.0–7.0)
Eosinophils Absolute: 0.2 10*3/uL (ref 0.0–0.5)
HCT: 47.5 % (ref 38.7–49.9)
HGB: 16.1 g/dL (ref 13.0–17.1)
LYMPH%: 19.9 % (ref 14.0–48.0)
MCH: 27.5 pg — ABNORMAL LOW (ref 28.0–33.4)
MCHC: 33.8 g/dL (ref 32.0–35.9)
MCV: 81.3 fL — ABNORMAL LOW (ref 81.6–98.0)
MONO#: 0.8 10*3/uL (ref 0.1–0.9)
MONO%: 11.3 % (ref 0.0–13.0)
NEUT#: 5 10*3/uL (ref 1.5–6.5)
NEUT%: 66.1 % (ref 40.0–75.0)
Platelets: 268 10*3/uL (ref 145–400)
RBC: 5.84 10*6/uL — ABNORMAL HIGH (ref 4.20–5.71)
RDW: 16.4 % — ABNORMAL HIGH (ref 11.2–14.6)
WBC: 7.5 10*3/uL (ref 4.0–10.0)
lymph#: 1.5 10*3/uL (ref 0.9–3.3)

## 2007-10-29 LAB — TESTOSTERONE: Testosterone: 182.91 ng/dL — ABNORMAL LOW (ref 350–890)

## 2007-10-29 LAB — TSH: TSH: 0.309 u[IU]/mL — ABNORMAL LOW (ref 0.350–5.500)

## 2007-11-02 ENCOUNTER — Encounter: Payer: Self-pay | Admitting: Internal Medicine

## 2007-11-02 LAB — CONVERTED CEMR LAB: Vit D, 1,25-Dihydroxy: 24 — ABNORMAL LOW (ref 30–89)

## 2007-11-04 DIAGNOSIS — M545 Low back pain, unspecified: Secondary | ICD-10-CM | POA: Insufficient documentation

## 2007-11-04 DIAGNOSIS — Z9089 Acquired absence of other organs: Secondary | ICD-10-CM | POA: Insufficient documentation

## 2007-11-04 DIAGNOSIS — I2510 Atherosclerotic heart disease of native coronary artery without angina pectoris: Secondary | ICD-10-CM | POA: Insufficient documentation

## 2007-11-18 ENCOUNTER — Ambulatory Visit (HOSPITAL_COMMUNITY): Payer: Self-pay | Admitting: Psychiatry

## 2007-12-01 ENCOUNTER — Encounter: Payer: Self-pay | Admitting: Internal Medicine

## 2007-12-08 ENCOUNTER — Telehealth: Payer: Self-pay | Admitting: Internal Medicine

## 2007-12-10 ENCOUNTER — Ambulatory Visit (HOSPITAL_COMMUNITY): Payer: Self-pay | Admitting: Licensed Clinical Social Worker

## 2007-12-16 ENCOUNTER — Ambulatory Visit (HOSPITAL_COMMUNITY): Payer: Self-pay | Admitting: Licensed Clinical Social Worker

## 2007-12-21 ENCOUNTER — Ambulatory Visit (HOSPITAL_COMMUNITY): Payer: Self-pay | Admitting: Psychiatry

## 2007-12-23 ENCOUNTER — Ambulatory Visit (HOSPITAL_COMMUNITY): Payer: Self-pay | Admitting: Licensed Clinical Social Worker

## 2007-12-30 ENCOUNTER — Ambulatory Visit (HOSPITAL_COMMUNITY): Payer: Self-pay | Admitting: Licensed Clinical Social Worker

## 2008-01-06 ENCOUNTER — Ambulatory Visit (HOSPITAL_COMMUNITY): Payer: Self-pay | Admitting: Licensed Clinical Social Worker

## 2008-01-14 ENCOUNTER — Ambulatory Visit: Payer: Self-pay | Admitting: Cardiology

## 2008-01-20 ENCOUNTER — Ambulatory Visit (HOSPITAL_COMMUNITY): Payer: Self-pay | Admitting: Licensed Clinical Social Worker

## 2008-01-25 ENCOUNTER — Ambulatory Visit (HOSPITAL_COMMUNITY): Payer: Self-pay | Admitting: Licensed Clinical Social Worker

## 2008-02-02 ENCOUNTER — Ambulatory Visit (HOSPITAL_COMMUNITY): Payer: Self-pay | Admitting: Licensed Clinical Social Worker

## 2008-02-29 ENCOUNTER — Ambulatory Visit (HOSPITAL_COMMUNITY): Payer: Self-pay | Admitting: Licensed Clinical Social Worker

## 2008-03-17 ENCOUNTER — Ambulatory Visit (HOSPITAL_COMMUNITY): Payer: Self-pay | Admitting: Licensed Clinical Social Worker

## 2008-03-22 ENCOUNTER — Ambulatory Visit (HOSPITAL_COMMUNITY): Payer: Self-pay | Admitting: Licensed Clinical Social Worker

## 2008-03-28 ENCOUNTER — Ambulatory Visit (HOSPITAL_COMMUNITY): Payer: Self-pay | Admitting: Licensed Clinical Social Worker

## 2008-03-29 ENCOUNTER — Ambulatory Visit: Payer: Self-pay | Admitting: Internal Medicine

## 2008-03-29 DIAGNOSIS — R05 Cough: Secondary | ICD-10-CM | POA: Insufficient documentation

## 2008-03-29 DIAGNOSIS — R059 Cough, unspecified: Secondary | ICD-10-CM | POA: Insufficient documentation

## 2008-03-30 ENCOUNTER — Ambulatory Visit: Payer: Self-pay | Admitting: Cardiology

## 2008-03-30 ENCOUNTER — Ambulatory Visit: Payer: Self-pay | Admitting: Internal Medicine

## 2008-03-30 ENCOUNTER — Telehealth: Payer: Self-pay | Admitting: Internal Medicine

## 2008-03-30 LAB — CONVERTED CEMR LAB
ALT: 24 units/L (ref 0–53)
AST: 21 units/L (ref 0–37)
Albumin: 3.7 g/dL (ref 3.5–5.2)
Alkaline Phosphatase: 50 units/L (ref 39–117)
Basophils Absolute: 0 10*3/uL (ref 0.0–0.1)
Basophils Relative: 0.6 % (ref 0.0–3.0)
Bilirubin, Direct: 0.1 mg/dL (ref 0.0–0.3)
Cholesterol: 184 mg/dL (ref 0–200)
Eosinophils Absolute: 0.4 10*3/uL (ref 0.0–0.7)
Eosinophils Relative: 5.1 % — ABNORMAL HIGH (ref 0.0–5.0)
HCT: 47.8 % (ref 39.0–52.0)
HDL: 48.8 mg/dL (ref >39.0)
Hemoglobin: 15.9 g/dL (ref 13.0–17.0)
LDL Cholesterol: 104 mg/dL — ABNORMAL HIGH (ref 0–99)
Lymphocytes Relative: 23.8 % (ref 12.0–46.0)
MCHC: 33.2 g/dL (ref 30.0–36.0)
MCV: 83.8 fL (ref 78.0–100.0)
Monocytes Absolute: 1 10*3/uL (ref 0.1–1.0)
Monocytes Relative: 12.5 % — ABNORMAL HIGH (ref 3.0–12.0)
Neutro Abs: 4.6 10*3/uL (ref 1.4–7.7)
Neutrophils Relative %: 58 % (ref 43.0–77.0)
Platelets: 293 10*3/uL (ref 150–400)
RBC: 5.7 M/uL (ref 4.22–5.81)
RDW: 14.8 % — ABNORMAL HIGH (ref 11.5–14.6)
Retic Ct Pct: 0.8 % (ref 0.4–3.1)
TSH: 0.36 microintl units/mL (ref 0.35–5.50)
Testosterone: 397.93 ng/dL (ref 350.00–890)
Total Bilirubin: 0.9 mg/dL (ref 0.3–1.2)
Total CHOL/HDL Ratio: 3.8
Total Protein: 6.6 g/dL (ref 6.0–8.3)
Triglycerides: 155 mg/dL — ABNORMAL HIGH (ref 0–149)
VLDL: 31 mg/dL (ref 0–40)
WBC: 7.9 10*3/uL (ref 4.5–10.5)

## 2008-04-04 ENCOUNTER — Ambulatory Visit (HOSPITAL_COMMUNITY): Payer: Self-pay | Admitting: Licensed Clinical Social Worker

## 2008-04-05 ENCOUNTER — Ambulatory Visit (HOSPITAL_BASED_OUTPATIENT_CLINIC_OR_DEPARTMENT_OTHER): Admission: RE | Admit: 2008-04-05 | Discharge: 2008-04-06 | Payer: Self-pay | Admitting: Orthopedic Surgery

## 2008-04-05 HISTORY — PX: OTHER SURGICAL HISTORY: SHX169

## 2008-05-03 ENCOUNTER — Telehealth: Payer: Self-pay | Admitting: Internal Medicine

## 2008-05-06 ENCOUNTER — Ambulatory Visit: Payer: Self-pay | Admitting: Oncology

## 2008-05-13 ENCOUNTER — Telehealth (INDEPENDENT_AMBULATORY_CARE_PROVIDER_SITE_OTHER): Payer: Self-pay | Admitting: *Deleted

## 2008-05-14 ENCOUNTER — Ambulatory Visit: Payer: Self-pay | Admitting: Family Medicine

## 2008-05-14 DIAGNOSIS — R5381 Other malaise: Secondary | ICD-10-CM | POA: Insufficient documentation

## 2008-05-14 DIAGNOSIS — R21 Rash and other nonspecific skin eruption: Secondary | ICD-10-CM | POA: Insufficient documentation

## 2008-05-16 ENCOUNTER — Encounter: Payer: Self-pay | Admitting: Internal Medicine

## 2008-05-16 ENCOUNTER — Ambulatory Visit: Payer: Self-pay | Admitting: Family Medicine

## 2008-05-18 LAB — CONVERTED CEMR LAB
Basophils Absolute: 0 10*3/uL (ref 0.0–0.1)
Basophils Relative: 0.4 % (ref 0.0–3.0)
Eosinophils Absolute: 0.1 10*3/uL (ref 0.0–0.7)
Eosinophils Relative: 1.6 % (ref 0.0–5.0)
Free T4: 1 ng/dL (ref 0.6–1.6)
HCT: 47.6 % (ref 39.0–52.0)
Hemoglobin: 16.3 g/dL (ref 13.0–17.0)
Lymphocytes Relative: 19 % (ref 12.0–46.0)
MCHC: 34.2 g/dL (ref 30.0–36.0)
MCV: 84.4 fL (ref 78.0–100.0)
Monocytes Absolute: 0.9 10*3/uL (ref 0.1–1.0)
Monocytes Relative: 11.7 % (ref 3.0–12.0)
Neutro Abs: 5.5 10*3/uL (ref 1.4–7.7)
Neutrophils Relative %: 67.3 % (ref 43.0–77.0)
Platelets: 281 10*3/uL (ref 150–400)
RBC: 5.64 M/uL (ref 4.22–5.81)
RDW: 14.7 % — ABNORMAL HIGH (ref 11.5–14.6)
T4, Total: 8.9 ug/dL (ref 5.0–12.5)
TSH: 0.62 microintl units/mL (ref 0.35–5.50)
Testosterone: 248.97 ng/dL — ABNORMAL LOW (ref 350.00–890)
WBC: 8 10*3/uL (ref 4.5–10.5)

## 2008-05-19 LAB — CONVERTED CEMR LAB
Sex Hormone Binding: 11 nmol/L — ABNORMAL LOW (ref 13–71)
Testosterone Free: 66.8 pg/mL (ref 47.0–244.0)
Testosterone-% Free: 3.1 % — ABNORMAL HIGH (ref 1.6–2.9)
Testosterone: 216.26 ng/dL — ABNORMAL LOW (ref 350–890)
Vit D, 1,25-Dihydroxy: 69 (ref 30–89)

## 2008-05-20 ENCOUNTER — Telehealth: Payer: Self-pay | Admitting: Internal Medicine

## 2008-05-24 ENCOUNTER — Telehealth: Payer: Self-pay | Admitting: Internal Medicine

## 2008-05-24 ENCOUNTER — Ambulatory Visit: Payer: Self-pay | Admitting: Internal Medicine

## 2008-05-24 DIAGNOSIS — N453 Epididymo-orchitis: Secondary | ICD-10-CM | POA: Insufficient documentation

## 2008-05-25 ENCOUNTER — Telehealth: Payer: Self-pay | Admitting: Family Medicine

## 2008-06-07 ENCOUNTER — Ambulatory Visit: Payer: Self-pay | Admitting: Internal Medicine

## 2008-06-16 ENCOUNTER — Ambulatory Visit: Payer: Self-pay | Admitting: Internal Medicine

## 2008-06-17 ENCOUNTER — Telehealth: Payer: Self-pay | Admitting: Internal Medicine

## 2008-06-17 ENCOUNTER — Ambulatory Visit (HOSPITAL_COMMUNITY): Payer: Self-pay | Admitting: Psychiatry

## 2008-06-23 ENCOUNTER — Ambulatory Visit: Payer: Self-pay | Admitting: Oncology

## 2008-06-27 ENCOUNTER — Encounter: Payer: Self-pay | Admitting: Internal Medicine

## 2008-06-29 ENCOUNTER — Encounter: Payer: Self-pay | Admitting: Internal Medicine

## 2008-07-11 ENCOUNTER — Ambulatory Visit: Payer: Self-pay | Admitting: Internal Medicine

## 2008-07-11 DIAGNOSIS — M79609 Pain in unspecified limb: Secondary | ICD-10-CM | POA: Insufficient documentation

## 2008-07-11 DIAGNOSIS — R030 Elevated blood-pressure reading, without diagnosis of hypertension: Secondary | ICD-10-CM | POA: Insufficient documentation

## 2008-08-29 ENCOUNTER — Telehealth: Payer: Self-pay | Admitting: Internal Medicine

## 2008-08-31 ENCOUNTER — Encounter
Admission: RE | Admit: 2008-08-31 | Discharge: 2008-08-31 | Payer: Self-pay | Admitting: Physical Medicine and Rehabilitation

## 2008-09-02 ENCOUNTER — Encounter: Admission: RE | Admit: 2008-09-02 | Discharge: 2008-09-02 | Payer: Self-pay | Admitting: Orthopedic Surgery

## 2008-09-05 ENCOUNTER — Ambulatory Visit (HOSPITAL_BASED_OUTPATIENT_CLINIC_OR_DEPARTMENT_OTHER): Admission: RE | Admit: 2008-09-05 | Discharge: 2008-09-05 | Payer: Self-pay | Admitting: Orthopedic Surgery

## 2008-10-09 HISTORY — PX: SHOULDER ARTHROSCOPY W/ SUBACROMIAL DECOMPRESSION AND DISTAL CLAVICLE EXCISION: SHX2401

## 2008-10-19 ENCOUNTER — Ambulatory Visit (HOSPITAL_COMMUNITY): Payer: Self-pay | Admitting: Psychiatry

## 2008-10-19 DIAGNOSIS — I2510 Atherosclerotic heart disease of native coronary artery without angina pectoris: Secondary | ICD-10-CM | POA: Insufficient documentation

## 2008-10-19 DIAGNOSIS — E785 Hyperlipidemia, unspecified: Secondary | ICD-10-CM | POA: Insufficient documentation

## 2008-10-20 ENCOUNTER — Ambulatory Visit: Payer: Self-pay | Admitting: Internal Medicine

## 2008-10-20 ENCOUNTER — Encounter: Payer: Self-pay | Admitting: Internal Medicine

## 2008-10-20 DIAGNOSIS — M159 Polyosteoarthritis, unspecified: Secondary | ICD-10-CM | POA: Insufficient documentation

## 2008-10-21 ENCOUNTER — Encounter: Payer: Self-pay | Admitting: Internal Medicine

## 2008-11-09 ENCOUNTER — Ambulatory Visit (HOSPITAL_COMMUNITY): Payer: Self-pay | Admitting: Licensed Clinical Social Worker

## 2008-11-18 ENCOUNTER — Encounter: Payer: Self-pay | Admitting: Internal Medicine

## 2009-01-03 ENCOUNTER — Telehealth: Payer: Self-pay | Admitting: Internal Medicine

## 2009-01-12 ENCOUNTER — Telehealth: Payer: Self-pay | Admitting: Internal Medicine

## 2009-01-23 ENCOUNTER — Ambulatory Visit (HOSPITAL_COMMUNITY): Payer: Self-pay | Admitting: Psychiatry

## 2009-01-26 ENCOUNTER — Encounter: Payer: Self-pay | Admitting: Internal Medicine

## 2009-03-29 ENCOUNTER — Ambulatory Visit (HOSPITAL_COMMUNITY): Payer: Self-pay | Admitting: Psychiatry

## 2009-04-03 ENCOUNTER — Encounter: Payer: Self-pay | Admitting: Internal Medicine

## 2009-05-15 ENCOUNTER — Telehealth: Payer: Self-pay | Admitting: Internal Medicine

## 2009-05-25 ENCOUNTER — Encounter: Payer: Self-pay | Admitting: Internal Medicine

## 2009-05-25 LAB — CONVERTED CEMR LAB
ALT: 29 units/L (ref 0–53)
AST: 24 units/L (ref 0–37)
Albumin: 4.7 g/dL (ref 3.5–5.2)
Alkaline Phosphatase: 54 units/L (ref 39–117)
BUN: 16 mg/dL (ref 6–23)
Bilirubin, Direct: 0.2 mg/dL (ref 0.0–0.3)
CO2: 25 meq/L (ref 19–32)
Calcium: 8.9 mg/dL (ref 8.4–10.5)
Chloride: 101 meq/L (ref 96–112)
Cholesterol: 199 mg/dL (ref 0–200)
Creatinine, Ser: 1.1 mg/dL (ref 0.40–1.50)
Glucose, Bld: 83 mg/dL (ref 70–99)
HDL: 60 mg/dL (ref >39)
Indirect Bilirubin: 0.8 mg/dL (ref 0.0–0.9)
LDL Cholesterol: 108 mg/dL — ABNORMAL HIGH (ref 0–99)
Potassium: 4.4 meq/L (ref 3.5–5.3)
Sodium: 139 meq/L (ref 135–145)
TSH: 3.416 microintl units/mL (ref 0.350–4.500)
Total Bilirubin: 1 mg/dL (ref 0.3–1.2)
Total CHOL/HDL Ratio: 3.3
Total Protein: 7.1 g/dL (ref 6.0–8.3)
Triglycerides: 154 mg/dL — ABNORMAL HIGH (ref <150)
VLDL: 31 mg/dL (ref 0–40)

## 2009-05-31 ENCOUNTER — Encounter: Payer: Self-pay | Admitting: Internal Medicine

## 2009-06-07 ENCOUNTER — Telehealth: Payer: Self-pay | Admitting: Internal Medicine

## 2009-06-13 ENCOUNTER — Telehealth: Payer: Self-pay | Admitting: Internal Medicine

## 2009-06-15 ENCOUNTER — Ambulatory Visit: Payer: Self-pay | Admitting: Oncology

## 2009-06-27 ENCOUNTER — Encounter: Payer: Self-pay | Admitting: Internal Medicine

## 2009-06-29 ENCOUNTER — Encounter: Payer: Self-pay | Admitting: Internal Medicine

## 2009-07-10 ENCOUNTER — Telehealth: Payer: Self-pay | Admitting: Internal Medicine

## 2009-07-12 ENCOUNTER — Encounter: Payer: Self-pay | Admitting: Internal Medicine

## 2009-07-14 ENCOUNTER — Encounter: Payer: Self-pay | Admitting: Internal Medicine

## 2009-07-18 ENCOUNTER — Encounter: Payer: Self-pay | Admitting: Internal Medicine

## 2009-08-02 ENCOUNTER — Encounter: Payer: Self-pay | Admitting: Internal Medicine

## 2009-08-04 LAB — CONVERTED CEMR LAB
ALT: 27 units/L (ref 0–53)
AST: 17 units/L (ref 0–37)
Albumin: 4.5 g/dL (ref 3.5–5.2)
Alkaline Phosphatase: 59 units/L (ref 39–117)
BUN: 25 mg/dL — ABNORMAL HIGH (ref 6–23)
Bilirubin Urine: NEGATIVE
Bilirubin, Direct: 0.1 mg/dL (ref 0.0–0.3)
CO2: 20 meq/L (ref 19–32)
Calcium: 9.2 mg/dL (ref 8.4–10.5)
Chloride: 99 meq/L (ref 96–112)
Cholesterol: 193 mg/dL (ref 0–200)
Creatinine, Ser: 1.02 mg/dL (ref 0.40–1.50)
Glucose, Bld: 106 mg/dL — ABNORMAL HIGH (ref 70–99)
HCT: 49.8 % (ref 39.0–52.0)
HDL: 71 mg/dL (ref >39)
Hemoglobin, Urine: NEGATIVE
Hemoglobin: 17 g/dL (ref 13.0–17.0)
Indirect Bilirubin: 0.4 mg/dL (ref 0.0–0.9)
Ketones, ur: NEGATIVE mg/dL
LDL Cholesterol: 105 mg/dL — ABNORMAL HIGH (ref 0–99)
Leukocytes, UA: NEGATIVE
MCHC: 34.1 g/dL (ref 30.0–36.0)
MCV: 82.5 fL (ref 78.0–100.0)
Nitrite: NEGATIVE
Platelets: 247 10*3/uL (ref 150–400)
Potassium: 4.4 meq/L (ref 3.5–5.3)
Protein, ur: NEGATIVE mg/dL
RBC: 6.04 M/uL — ABNORMAL HIGH (ref 4.22–5.81)
RDW: 15.5 % (ref 11.5–15.5)
Sodium: 135 meq/L (ref 135–145)
Specific Gravity, Urine: 1.04 — ABNORMAL HIGH (ref 1.005–1.030)
TSH: 1.077 microintl units/mL (ref 0.350–4.500)
Total Bilirubin: 0.5 mg/dL (ref 0.3–1.2)
Total CHOL/HDL Ratio: 2.7
Total Protein: 6.9 g/dL (ref 6.0–8.3)
Triglycerides: 85 mg/dL (ref <150)
Urine Glucose: NEGATIVE mg/dL
Urobilinogen, UA: 0.2 (ref 0.0–1.0)
VLDL: 17 mg/dL (ref 0–40)
Vit D, 25-Hydroxy: 71 ng/mL (ref 30–89)
Vitamin B-12: 658 pg/mL (ref 211–911)
WBC: 20 10*3/uL — ABNORMAL HIGH (ref 4.0–10.5)
pH: 6 (ref 5.0–8.0)

## 2009-08-07 ENCOUNTER — Ambulatory Visit: Payer: Self-pay | Admitting: Internal Medicine

## 2009-08-07 DIAGNOSIS — M1990 Unspecified osteoarthritis, unspecified site: Secondary | ICD-10-CM | POA: Insufficient documentation

## 2009-08-07 DIAGNOSIS — F329 Major depressive disorder, single episode, unspecified: Secondary | ICD-10-CM | POA: Insufficient documentation

## 2009-08-07 DIAGNOSIS — R799 Abnormal finding of blood chemistry, unspecified: Secondary | ICD-10-CM | POA: Insufficient documentation

## 2009-08-25 ENCOUNTER — Encounter: Payer: Self-pay | Admitting: Internal Medicine

## 2009-09-04 ENCOUNTER — Ambulatory Visit (HOSPITAL_COMMUNITY): Payer: Self-pay | Admitting: Psychiatry

## 2009-09-06 ENCOUNTER — Ambulatory Visit (HOSPITAL_COMMUNITY): Payer: Self-pay | Admitting: Psychiatry

## 2009-09-20 ENCOUNTER — Ambulatory Visit (HOSPITAL_COMMUNITY): Payer: Self-pay | Admitting: Licensed Clinical Social Worker

## 2009-10-04 ENCOUNTER — Ambulatory Visit (HOSPITAL_COMMUNITY): Payer: Self-pay | Admitting: Licensed Clinical Social Worker

## 2009-10-24 ENCOUNTER — Encounter: Payer: Self-pay | Admitting: Internal Medicine

## 2009-10-26 ENCOUNTER — Telehealth: Payer: Self-pay | Admitting: Internal Medicine

## 2009-10-27 ENCOUNTER — Ambulatory Visit (HOSPITAL_COMMUNITY): Payer: Self-pay | Admitting: Licensed Clinical Social Worker

## 2009-10-29 ENCOUNTER — Emergency Department (HOSPITAL_COMMUNITY): Admission: EM | Admit: 2009-10-29 | Discharge: 2009-10-29 | Payer: Self-pay | Admitting: Family Medicine

## 2009-10-30 ENCOUNTER — Telehealth: Payer: Self-pay | Admitting: Internal Medicine

## 2009-10-31 ENCOUNTER — Telehealth: Payer: Self-pay | Admitting: Internal Medicine

## 2009-11-13 ENCOUNTER — Ambulatory Visit (HOSPITAL_COMMUNITY): Payer: Self-pay | Admitting: Licensed Clinical Social Worker

## 2009-11-27 ENCOUNTER — Encounter: Payer: Self-pay | Admitting: Internal Medicine

## 2009-11-30 ENCOUNTER — Ambulatory Visit: Payer: Self-pay | Admitting: Internal Medicine

## 2009-11-30 ENCOUNTER — Encounter (INDEPENDENT_AMBULATORY_CARE_PROVIDER_SITE_OTHER): Payer: Self-pay | Admitting: *Deleted

## 2009-11-30 DIAGNOSIS — R198 Other specified symptoms and signs involving the digestive system and abdomen: Secondary | ICD-10-CM | POA: Insufficient documentation

## 2009-11-30 DIAGNOSIS — K222 Esophageal obstruction: Secondary | ICD-10-CM | POA: Insufficient documentation

## 2009-11-30 DIAGNOSIS — J209 Acute bronchitis, unspecified: Secondary | ICD-10-CM | POA: Insufficient documentation

## 2009-12-05 ENCOUNTER — Encounter: Payer: Self-pay | Admitting: Internal Medicine

## 2009-12-06 ENCOUNTER — Encounter: Payer: Self-pay | Admitting: Internal Medicine

## 2009-12-25 ENCOUNTER — Ambulatory Visit (HOSPITAL_COMMUNITY): Payer: Self-pay | Admitting: Licensed Clinical Social Worker

## 2009-12-27 ENCOUNTER — Encounter: Payer: Self-pay | Admitting: Internal Medicine

## 2010-01-03 ENCOUNTER — Telehealth: Payer: Self-pay | Admitting: Internal Medicine

## 2010-01-15 ENCOUNTER — Ambulatory Visit (HOSPITAL_COMMUNITY): Payer: Self-pay | Admitting: Licensed Clinical Social Worker

## 2010-02-02 ENCOUNTER — Ambulatory Visit (HOSPITAL_COMMUNITY): Payer: Self-pay | Admitting: Psychiatry

## 2010-02-02 ENCOUNTER — Telehealth: Payer: Self-pay | Admitting: Internal Medicine

## 2010-02-08 DIAGNOSIS — R0789 Other chest pain: Secondary | ICD-10-CM | POA: Insufficient documentation

## 2010-02-08 DIAGNOSIS — R079 Chest pain, unspecified: Secondary | ICD-10-CM

## 2010-02-09 ENCOUNTER — Ambulatory Visit (HOSPITAL_COMMUNITY): Payer: Self-pay | Admitting: Licensed Clinical Social Worker

## 2010-02-20 ENCOUNTER — Telehealth (INDEPENDENT_AMBULATORY_CARE_PROVIDER_SITE_OTHER): Payer: Self-pay | Admitting: *Deleted

## 2010-02-21 ENCOUNTER — Encounter: Payer: Self-pay | Admitting: Physician Assistant

## 2010-02-21 ENCOUNTER — Ambulatory Visit: Payer: Self-pay | Admitting: Internal Medicine

## 2010-02-21 ENCOUNTER — Encounter (HOSPITAL_COMMUNITY): Admission: RE | Admit: 2010-02-21 | Discharge: 2010-04-18 | Payer: Self-pay | Admitting: Internal Medicine

## 2010-02-21 ENCOUNTER — Ambulatory Visit: Payer: Self-pay

## 2010-02-28 ENCOUNTER — Ambulatory Visit (HOSPITAL_COMMUNITY): Payer: Self-pay | Admitting: Licensed Clinical Social Worker

## 2010-03-19 ENCOUNTER — Ambulatory Visit (HOSPITAL_COMMUNITY): Payer: Self-pay | Admitting: Licensed Clinical Social Worker

## 2010-03-21 ENCOUNTER — Encounter: Payer: Self-pay | Admitting: Internal Medicine

## 2010-03-27 ENCOUNTER — Telehealth: Payer: Self-pay | Admitting: Internal Medicine

## 2010-03-28 ENCOUNTER — Telehealth: Payer: Self-pay | Admitting: Internal Medicine

## 2010-04-02 ENCOUNTER — Ambulatory Visit (HOSPITAL_COMMUNITY): Payer: Self-pay | Admitting: Licensed Clinical Social Worker

## 2010-04-10 ENCOUNTER — Ambulatory Visit (HOSPITAL_COMMUNITY): Payer: Self-pay | Admitting: Licensed Clinical Social Worker

## 2010-04-23 ENCOUNTER — Ambulatory Visit (HOSPITAL_COMMUNITY): Payer: Self-pay | Admitting: Psychiatry

## 2010-04-27 ENCOUNTER — Ambulatory Visit (HOSPITAL_COMMUNITY): Payer: Self-pay | Admitting: Licensed Clinical Social Worker

## 2010-05-11 ENCOUNTER — Encounter: Admission: RE | Admit: 2010-05-11 | Discharge: 2010-05-11 | Payer: Self-pay | Admitting: *Deleted

## 2010-05-23 ENCOUNTER — Ambulatory Visit (HOSPITAL_COMMUNITY): Payer: Self-pay | Admitting: Licensed Clinical Social Worker

## 2010-05-30 ENCOUNTER — Telehealth: Payer: Self-pay | Admitting: Internal Medicine

## 2010-05-30 ENCOUNTER — Ambulatory Visit (HOSPITAL_COMMUNITY): Payer: Self-pay | Admitting: Psychiatry

## 2010-06-13 ENCOUNTER — Encounter: Payer: Self-pay | Admitting: Internal Medicine

## 2010-06-13 ENCOUNTER — Telehealth: Payer: Self-pay | Admitting: Internal Medicine

## 2010-06-21 ENCOUNTER — Telehealth: Payer: Self-pay | Admitting: Internal Medicine

## 2010-06-22 ENCOUNTER — Encounter: Payer: Self-pay | Admitting: Internal Medicine

## 2010-06-22 LAB — CONVERTED CEMR LAB
BUN: 20 mg/dL
CO2: 21 meq/L
Calcium: 8.8 mg/dL
Chloride: 103 meq/L
Creatinine, Ser: 1.09 mg/dL
Glucose, Bld: 138 mg/dL
HCT: 50.1 %
Hemoglobin: 17 g/dL
MCV: 80.7 fL
PSA: 0.82 ng/mL
Platelets: 289 10*3/uL
Potassium: 4.2 meq/L
RBC: 6.21 M/uL
RDW: 15.9 %
Sodium: 138 meq/L
TSH: 4.382 microintl units/mL
Testosterone: 529.96 ng/dL
WBC: 14 10*3/uL

## 2010-06-25 ENCOUNTER — Ambulatory Visit (HOSPITAL_COMMUNITY): Payer: Self-pay | Admitting: Licensed Clinical Social Worker

## 2010-06-29 ENCOUNTER — Telehealth: Payer: Self-pay | Admitting: Internal Medicine

## 2010-06-29 ENCOUNTER — Encounter: Payer: Self-pay | Admitting: Internal Medicine

## 2010-07-02 ENCOUNTER — Telehealth: Payer: Self-pay | Admitting: Internal Medicine

## 2010-07-18 ENCOUNTER — Ambulatory Visit (HOSPITAL_COMMUNITY): Payer: Self-pay | Admitting: Licensed Clinical Social Worker

## 2010-07-18 ENCOUNTER — Encounter: Payer: Self-pay | Admitting: Internal Medicine

## 2010-07-18 ENCOUNTER — Telehealth: Payer: Self-pay | Admitting: Internal Medicine

## 2010-07-19 ENCOUNTER — Encounter: Payer: Self-pay | Admitting: Internal Medicine

## 2010-07-20 LAB — CONVERTED CEMR LAB
ALT: 25 units/L (ref 0–53)
AST: 21 units/L (ref 0–37)
Albumin: 3.8 g/dL (ref 3.5–5.2)
Alkaline Phosphatase: 50 units/L (ref 39–117)
BUN: 17 mg/dL (ref 6–23)
Basophils Absolute: 0.1 10*3/uL (ref 0.0–0.1)
Basophils Relative: 1 % (ref 0.0–3.0)
Bilirubin Urine: NEGATIVE
Bilirubin, Direct: 0.1 mg/dL (ref 0.0–0.3)
Blood, UA: NEGATIVE
CO2: 32 meq/L (ref 19–32)
Calcium: 8.6 mg/dL (ref 8.4–10.5)
Chloride: 102 meq/L (ref 96–112)
Cholesterol: 188 mg/dL (ref 0–200)
Creatinine, Ser: 1 mg/dL (ref 0.4–1.5)
Eosinophils Absolute: 0.2 10*3/uL (ref 0.0–0.7)
Eosinophils Relative: 2 % (ref 0.0–5.0)
GFR calc non Af Amer: 81.97 mL/min (ref >60.00)
Glucose, Bld: 83 mg/dL (ref 70–99)
HCT: 49.6 % (ref 39.0–52.0)
HDL: 56.9 mg/dL (ref >39.00)
Hemoglobin: 16.6 g/dL (ref 13.0–17.0)
Ketones, ur: NEGATIVE mg/dL
LDL Cholesterol: 113 mg/dL — ABNORMAL HIGH (ref 0–99)
Leukocytes, UA: NEGATIVE
Lymphocytes Relative: 18.2 % (ref 12.0–46.0)
Lymphs Abs: 1.6 10*3/uL (ref 0.7–4.0)
MCHC: 33.6 g/dL (ref 30.0–36.0)
MCV: 84.3 fL (ref 78.0–100.0)
Monocytes Absolute: 1 10*3/uL (ref 0.1–1.0)
Monocytes Relative: 11.1 % (ref 3.0–12.0)
Neutro Abs: 6 10*3/uL (ref 1.4–7.7)
Neutrophils Relative %: 67.7 % (ref 43.0–77.0)
Nitrite: NEGATIVE
Platelets: 273 10*3/uL (ref 150.0–400.0)
Potassium: 4.7 meq/L (ref 3.5–5.1)
RBC: 5.89 M/uL — ABNORMAL HIGH (ref 4.22–5.81)
RDW: 16 % — ABNORMAL HIGH (ref 11.5–14.6)
Sodium: 140 meq/L (ref 135–145)
Specific Gravity, Urine: 1.015 (ref 1.000–1.030)
TSH: 3.39 microintl units/mL (ref 0.35–5.50)
Total Bilirubin: 0.8 mg/dL (ref 0.3–1.2)
Total CHOL/HDL Ratio: 3
Total Protein, Urine: NEGATIVE mg/dL
Total Protein: 6.5 g/dL (ref 6.0–8.3)
Triglycerides: 91 mg/dL (ref 0.0–149.0)
Urine Glucose: NEGATIVE mg/dL
Urobilinogen, UA: 0.2 (ref 0.0–1.0)
VLDL: 18.2 mg/dL (ref 0.0–40.0)
WBC: 8.9 10*3/uL (ref 4.5–10.5)
pH: 7.5 (ref 5.0–8.0)

## 2010-07-23 ENCOUNTER — Encounter: Payer: Self-pay | Admitting: Internal Medicine

## 2010-07-23 ENCOUNTER — Ambulatory Visit: Payer: Self-pay | Admitting: Internal Medicine

## 2010-07-23 ENCOUNTER — Ambulatory Visit (HOSPITAL_COMMUNITY): Payer: Self-pay | Admitting: Psychiatry

## 2010-07-24 ENCOUNTER — Encounter: Payer: Self-pay | Admitting: Internal Medicine

## 2010-07-27 ENCOUNTER — Ambulatory Visit: Payer: Self-pay | Admitting: Internal Medicine

## 2010-07-30 ENCOUNTER — Encounter: Payer: Self-pay | Admitting: Internal Medicine

## 2010-07-31 ENCOUNTER — Encounter: Payer: Self-pay | Admitting: Internal Medicine

## 2010-08-07 ENCOUNTER — Ambulatory Visit (HOSPITAL_COMMUNITY): Payer: Self-pay | Admitting: Licensed Clinical Social Worker

## 2010-08-08 ENCOUNTER — Ambulatory Visit: Payer: Self-pay | Admitting: Internal Medicine

## 2010-08-08 ENCOUNTER — Ambulatory Visit (HOSPITAL_COMMUNITY): Payer: Self-pay | Admitting: Psychiatry

## 2010-08-08 DIAGNOSIS — F419 Anxiety disorder, unspecified: Secondary | ICD-10-CM | POA: Insufficient documentation

## 2010-08-08 DIAGNOSIS — F411 Generalized anxiety disorder: Secondary | ICD-10-CM | POA: Insufficient documentation

## 2010-08-14 ENCOUNTER — Encounter: Payer: Self-pay | Admitting: Internal Medicine

## 2010-08-22 ENCOUNTER — Ambulatory Visit (HOSPITAL_COMMUNITY)
Admission: RE | Admit: 2010-08-22 | Discharge: 2010-08-22 | Payer: Self-pay | Source: Home / Self Care | Attending: Psychiatry | Admitting: Psychiatry

## 2010-08-24 ENCOUNTER — Ambulatory Visit (HOSPITAL_COMMUNITY)
Admission: RE | Admit: 2010-08-24 | Discharge: 2010-08-24 | Payer: Self-pay | Source: Home / Self Care | Attending: Licensed Clinical Social Worker | Admitting: Licensed Clinical Social Worker

## 2010-08-27 ENCOUNTER — Telehealth: Payer: Self-pay | Admitting: Internal Medicine

## 2010-09-01 ENCOUNTER — Encounter: Payer: Self-pay | Admitting: Oncology

## 2010-09-02 ENCOUNTER — Encounter: Payer: Self-pay | Admitting: Internal Medicine

## 2010-09-02 ENCOUNTER — Encounter: Payer: Self-pay | Admitting: Oncology

## 2010-09-03 ENCOUNTER — Ambulatory Visit: Admission: RE | Admit: 2010-09-03 | Discharge: 2010-09-03 | Payer: Self-pay | Source: Home / Self Care

## 2010-09-03 ENCOUNTER — Encounter: Payer: Self-pay | Admitting: Internal Medicine

## 2010-09-05 ENCOUNTER — Ambulatory Visit (HOSPITAL_COMMUNITY)
Admission: RE | Admit: 2010-09-05 | Discharge: 2010-09-05 | Payer: Self-pay | Source: Home / Self Care | Attending: Psychiatry | Admitting: Psychiatry

## 2010-09-07 ENCOUNTER — Ambulatory Visit (HOSPITAL_COMMUNITY)
Admission: RE | Admit: 2010-09-07 | Discharge: 2010-09-07 | Payer: Self-pay | Source: Home / Self Care | Attending: Licensed Clinical Social Worker | Admitting: Licensed Clinical Social Worker

## 2010-09-13 NOTE — Letter (Signed)
Summary: [facility]  [facility]   Imported By: Sherian Rein 09/08/2009 13:38:17  _____________________________________________________________________  External Attachment:    Type:   Image     Comment:   External Document

## 2010-09-13 NOTE — Op Note (Signed)
Summary: Facet Injection / [facility]  Facet Injection / [facility]   Imported By: Lennie Odor 06/27/2010 10:25:15  _____________________________________________________________________  External Attachment:    Type:   Image     Comment:   External Document

## 2010-09-13 NOTE — Letter (Signed)
[facility]   Imported By: Marylou Mccoy 07/12/2010 13:42:55  _____________________________________________________________________  External Attachment:    Type:   Image     Comment:   External Document

## 2010-09-13 NOTE — Progress Notes (Signed)
  Phone Note Call from Patient Call back at [phone]   Caller: Patient Reason for Call: Talk to Doctor Summary of Call: Pt called 06/13/10 at 7:15 pm. States he had finger surgery for a cyst as an office procedure yesterday, and also had a cortisone injection by Dr. Ethelene Hal this afternoon for degenerative joint disease. His blood pressure tends to run 130's, but this afternoon has been running in the 160's range. He is asymptomatic from a cardiac standpoint including chest pain, SOB, dizziness, lightheadedness, visual changes, but just endorses pain from his finger surgery site. He takes morphine scheduled q6hr as needed (patient has metastatic sq. cell cancer as well), last dose was at 12:30pm. Suspect isolated elevated BP is secondary to pain. He is on max dose of losaartan of 100mg  daily. Instructed patient to take his pain medicine as directed, recheck blood pressure in 1 hour, if still elevated >170, call back; take BP in AM, if still elevated call Dr. Prescott Gum office for possible work-in vs. new med adjustment. Patient instructed to go to ER if develops any of the above symptoms, expressed understanding. Initial call taken by: Ronie Spies PA-C

## 2010-09-13 NOTE — Progress Notes (Signed)
Summary: problem getting med  Phone Note Call from Patient   Caller: Patient Reason for Call: Talk to Nurse Summary of Call: pt wants a call re micardis rx-having problems getting filled-also wants to know if we have samples? pls call [phone] Initial call taken by: Glynda Jaeger,  March 28, 2010 4:31 PM  Follow-up for Phone Call        pt called yesterday and we verified the [facility] from [facility] was here, he needs his micardis asap, has been out for 6 days, can it be called in? pls call pt when done #[phone] Glynda Jaeger  March 29, 2010 9:23 AM     Appended Document: problem getting med ok by me.  Appended Document: problem getting med pt aware   Clinical Lists Changes  Medications: Rx of MICARDIS 80 MG  TABS (TELMISARTAN) Take 1 tablet by mouth once a day;  #90 Tablet x 3;  Signed;  Entered by: Meredith Staggers, RN;  Authorized by: Dolores Patty, MD, [facility];  Method used: [facility] [address] [place], [place]  [address], Ph: [phone], Fax: [phone]    Prescriptions: MICARDIS 80 MG  TABS (TELMISARTAN) Take 1 tablet by mouth once a day  #90 Tablet x 3   Entered by:   Meredith Staggers, RN   Authorized by:   Dolores Patty, MD, [facility]   Signed by:   Meredith Staggers, RN on 03/29/2010   Method used:   Faxed to ...       [facility]order)       [address] [place], [place]  [address]       Ph: [phone]       Fax: [phone]   RxID:   [id number]

## 2010-09-13 NOTE — Progress Notes (Signed)
Summary: micardis--nonpreffered  Phone Note Other Incoming   Summary of Call: received note from [facility] that starting 01/10/10 Micardis will be a nonpreferred brand drug, discussed w/Dr Bensimhon and he suggestion pt can switch to Losartan 100mg  once daily.  Called and discussed w/pt he states he has just gotten a 90 day supply of his Miradis so it will be a couple of months before he needs more, he also states he hates changing meds so he try to get it refilled and pay for it if its not too much, he will let me know if he needs to switch Initial call taken by: Meredith Staggers, RN,  Jan 03, 2010 5:16 PM

## 2010-09-13 NOTE — Op Note (Signed)
Summary: [facility]   Imported By: Sherian Rein 08/23/2010 14:18:10  _____________________________________________________________________  External Attachment:    Type:   Image     Comment:   External Document

## 2010-09-13 NOTE — Letter (Signed)
[facility]   Imported By: Roderic Ovens 01/29/2010 10:05:24  _____________________________________________________________________  External Attachment:    Type:   Image     Comment:   External Document

## 2010-09-13 NOTE — Procedures (Signed)
Summary: EGD/Dil: Dr. Yevonne Pax: Hiatal Hernia, Schatzki's Ring  Upper Endoscopy/[facility]   Imported By: Sherian Rein 01/16/2010 14:50:08  _____________________________________________________________________  External Attachment:    Type:   Image     Comment:   External Document  Appended Document: EGD/Dil: Dr. Yevonne Pax: Hiatal Hernia, Schatzki's Ring   EGD  Procedure date:  12/27/2009  Findings:      IMPRESSIONS: Hiatal Hernia Schatzki's Ring Esophagus dilated with 54 French without complications Performed by Dr. Zachery Dakins in [place]

## 2010-09-13 NOTE — Letter (Signed)
Summary: Walk In Patient Message/[facility]  Walk In Patient Message/[facility]   Imported By: Sherian Rein 07/30/2010 08:56:16  _____________________________________________________________________  External Attachment:    Type:   Image     Comment:   External Document

## 2010-09-13 NOTE — Medication Information (Signed)
Summary: Prior [facility] for Test[facility]  Prior [facility]   Imported By: Sherian Rein 12/11/2009 13:10:50  _____________________________________________________________________  External Attachment:    Type:   Image     Comment:   External Document

## 2010-09-13 NOTE — Progress Notes (Signed)
Summary: chest discomfort  Phone Note Call from Patient Call back at [phone]   Caller: Patient Reason for Call: Talk to Nurse Summary of Call: pt would like to talk with nurse concerning some chest discomfort that he is having.  Initial call taken by: Edman Circle,  February 02, 2010 9:13 AM  Follow-up for Phone Call        02/02/10 10am--pt calling stating achy CP in center of chest--pain level 2 of 10--experiences 2 x per day--recent dilatation of esophogus with no problems noted --does not worsen with exertion-does not have NTG to take-- sometimes relived by anacid advised:--we will make appoint with dr bensimhon--let your gastroenterologist know, but if CP persists go to nearest ED--pt agrees--appoint made for 7/13 at 10:45AM with PA--pt notified--nt Follow-up by: Ledon Snare, RN,  February 02, 2010 10:01 AM     Appended Document: chest discomfort agree with above; likely GI. However last cath 2007. Would schedule GXT with PA/NP. thanks.  Appended Document: chest discomfort pt aware, order placed for gxt, will try to change PA appt into GXT   Clinical Lists Changes  Problems: Added new problem of CHEST PAIN (ICD-786.50) Orders: Added new Referral order of Treadmill (Treadmill) - Signed      Appended Document: chest discomfort micardis was denied, called [phone] for prior auth and they state that pt must try losartan or a generic ace inhibitor for 30days before they will approve micardis, pt is aware he is upset by this informed him that losartan is very similar to Micardis and if he feels it doesn't work for him or he has side effects we can change back to Micardis after the 30 day trial he is agreeable w/this, [facility] [address]   Clinical Lists Changes  Medications: Changed medication from MICARDIS 80 MG  TABS (TELMISARTAN) Take 1 tablet by mouth once a day to LOSARTAN POTASSIUM 100 MG TABS (LOSARTAN POTASSIUM) Take 1 tablet by mouth  once a day - Signed Rx of LOSARTAN POTASSIUM 100 MG TABS (LOSARTAN POTASSIUM) Take 1 tablet by mouth once a day;  #30 x 3;  Signed;  Entered by: Meredith Staggers, RN;  Authorized by: Dolores Patty, MD, [facility];  Method used: Electronically to [facility]  [facility]  [phone]*, [address], [place], [name]  [address], Ph: [phone] or [phone], Fax: [phone]    Prescriptions: LOSARTAN POTASSIUM 100 MG TABS (LOSARTAN POTASSIUM) Take 1 tablet by mouth once a day  #30 x 3   Entered by:   Meredith Staggers, RN   Authorized by:   Dolores Patty, MD, [facility]   Signed by:   Meredith Staggers, RN on 03/29/2010   Method used:   Electronically to        [facility]  [phone]* (retail)       [address] [place], [name]  [address]       Ph: [phone] or [phone]       Fax: [phone]   RxID:   [id number]

## 2010-09-13 NOTE — Letter (Signed)
Summary: [facility]  [facility]   Imported By: Lester [place] 11/08/2009 11:19:42  _____________________________________________________________________  External Attachment:    Type:   Image     Comment:   External Document

## 2010-09-13 NOTE — Medication Information (Signed)
Summary: Response/[facility]  Response/[facility]   Imported By: Lester [place] 11/30/2009 10:07:10  _____________________________________________________________________  External Attachment:    Type:   Image     Comment:   External Document

## 2010-09-13 NOTE — Assessment & Plan Note (Signed)
Summary: f1y/dfg   Visit Type:  Follow-up Primary Provider:  Sula Soda, MD  CC:  no complaints.  History of Present Illness: Alex [name] is a 55 year old male with multiple medical problems including common variable immunodeficiency. He also has non obstructive coronary artery disease, hypertension, hyperlipidemia and  previous metastatic squamous cell carcinoma.  Had normal ETT 7/11 with 10:00 on Bruce.   He returns today for yearly followup. Overall he is doing fairly well. Active with biking and walking. No CP or SOB. Still having some problems with arthritis. Pending routine colonoscopy next week. No problems with meds.   BP has been running high ranging 130/92 to 160/99. Lipids followed by Dr. Posey Rea recent LDL 113.   Current Medications (verified): 1)  Lamictal 200 Mg  Tabs (Lamotrigine) .... Take 1 Tablet By Mouth Once A Day 2)  Lorazepam 1 Mg  Tabs (Lorazepam) .... Take 1-3  Tablets By Mouth Once A Day As Needed 3)  Testim 1 %  Gel (Testosterone) .... Qd 5 G 4)  Aciphex 20 Mg  Tbec (Rabeprazole Sodium) .... Take 1 Each Day 30 Minutes Before Bed 5)  Ambien 10 Mg  Tabs (Zolpidem Tartrate) .... Take 1 Tablet By Mouth Once A Day At Bedtime 6)  Hizentra 20% 4 Gm/42ml Soln (Immune Globulin (Human)) .... 2 Every Week 7)  Micardis 80 Mg Tabs (Telmisartan) .... Take 1 Tablet By Mouth Once A Day 8)  Synthroid 150 Mcg Tabs (Levothyroxine Sodium) .[name].. 1 By Mouth Qd 9)  Pravastatin Sodium 20 Mg Tabs (Pravastatin Sodium) .... Take 1 Tablet By Mouth At Bedtime 10)  Flonase 50 Mcg/act Susp (Fluticasone Propionate) .... As Needed 11)  Various Pain Meds .... 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 of syncope and sinus  bradycardia Non-obstructive CAD     -Cardiac Catheterization-06/07. LAD 40% Palpitations     -PVCs H/o metastatic squamous cell carcinoma    -s/p resection/chemotherapy/radiation Cataracts Glaucoma Low back pain Dr Ethelene Hal Osteoarthritis Dr Titus Dubin Depression Dr Lolly Mustache  Review of Systems       As per HPI and past medical history; otherwise all systems negative.   Vital Signs:  Patient profile:   55 year old male Height:      73 inches Weight:      179 pounds BMI:     23.70 Pulse rate:   71 / minute BP sitting:   152 / 98  (left arm) Cuff size:   regular  Vitals Entered By: Hardin Negus, RMA (July 23, 2010 12:38 PM)  Physical Exam  General:  Gen: well appearing. no resp difficulty HEENT: normal Neck: supple. no JVD. Carotids 2+ bilat; no bruits. No lymphadenopathy or thryomegaly appreciated. Cor: PMI nondisplaced. Regular rate & rhythm. No rubs, gallops, murmur. Lungs: clear Abdomen: soft, nontender, nondistended. No hepatosplenomegaly. No bruits or masses. Good bowel sounds. Extremities: no cyanosis, clubbing, rash, edema Neuro: alert & orientedx3, cranial nerves grossly intact. moves all 4 extremities w/o difficulty. affect pleasant    Impression & Recommendations:  Problem # 1:  CAD, NATIVE VESSEL (ICD-414.01) Mild non-obstructive. Recent ETT looked good. Continue risk factor management.   Problem # 2:  HYPERTENSION, BENIGN (ICD-401.1) BP elevated start amlodipine 5mg  once daily. Follow up with PCP.   Other Orders: EKG w/ Interpretation (93000)  Patient Instructions: 1)  Start  Amlodipine 5mg  daily 2)  Your physician wants you to follow-up in:  1 year.  You will receive a reminder letter in the mail two months in advance. If you don't receive a letter, please call our office to schedule the follow-up appointment. Prescriptions: AMLODIPINE BESYLATE 5 MG TABS (AMLODIPINE BESYLATE) Take one tablet by mouth daily  #90 x 3   Entered by:   Meredith Staggers,  RN   Authorized by:   Dolores Patty, MD, [facility]   Signed by:   Meredith Staggers, RN on 07/23/2010   Method used:   Electronically to        [facility]  [phone]* (retail)       [address] [place], [name]  [address]       Ph: [phone] or [phone]       Fax: [phone]   RxID:   [id number]   Prevention & Chronic Care Immunizations   Influenza vaccine: Fluvax 3+  (08/07/2009)    Tetanus booster: Not documented    Pneumococcal vaccine: Not documented  Colorectal Screening   Hemoccult: Not documented    Colonoscopy: Comments: 1) SIGMOID DIVERTICULOSIS 2) OTHERWISE NORMAL COLONOSCOPY TO CECUM 3) EXCELLENT PREP 4) FAMILY HX COLON CANCER AND PERSONAL HX COMMON VARIABLE IMMUNE DEFICIENCY  (06/16/2008)   Colonoscopy due: 06/2010  Other Screening   PSA: 0.82  (06/22/2010)   Smoking status: never  (08/07/2009)  Lipids   Total Cholesterol: 193  (08/02/2009)   LDL: 105  (08/02/2009)   LDL Direct: 134.7  (03/05/2007)   HDL: 71  (08/02/2009)   Triglycerides: 85  (08/02/2009)    SGOT (AST): 17  (08/02/2009)   SGPT (ALT): 27  (08/02/2009)   Alkaline phosphatase: 59  (08/02/2009)   Total bilirubin: 0.5  (08/02/2009)  Hypertension   Last Blood Pressure: 152 / 98  (07/23/2010)   Serum creatinine: 1.09  (06/22/2010)   Serum potassium 4.2  (06/22/2010)  Self-Management Support :    Hypertension self-management support: Not documented    Lipid self-management support: Not documented

## 2010-09-13 NOTE — Progress Notes (Signed)
Summary: Problems swallowing  Phone Note Call from Patient Call back at [phone]   Call For: Dr Leone Payor Reason for Call: Talk to Nurse Summary of Call: Problems swallowing- Stricture is closed up enough that food is getting stuck.  Initial call taken by: Leanor Kail [facility],  October 26, 2009 9:41 AM  Follow-up for Phone Call        Patient  with dysphagia getting progressivly worrse.  GERD not well controlled by Aciphex.  patient will come in on Monday and see Dr Leone Payor 10-30-09 9:30 Follow-up by: Darcey Nora RN, CGRN,  October 26, 2009 10:47 AM

## 2010-09-13 NOTE — Assessment & Plan Note (Signed)
Summary: PCPX/bcbs/#?cd   Vital Signs:  Patient profile:   55 year old male Height:      73 inches Weight:      178 pounds BMI:     23.57 Temp:     98.5 degrees F oral Pulse rate:   86 / minute Pulse rhythm:   regular Resp:     16 per minute BP sitting:   126 / 98  (left arm) Cuff size:   regular  Vitals Entered By: Lanier Prude, Beverly Gust) (August 08, 2010 3:15 PM) CC: CPX Is Patient Diabetic? No Comments pt has samples pf seroquel XR 50mg  he hasnt started yet.   Primary Care Kathleen Likins:  Sula Soda, MD  CC:  CPX.  History of Present Illness: The patient presents for a follow up of hypertension, stress, depression, hyperlipidemia  The patient presents for a preventive health examination   Current Medications (verified): 1)  Lamictal Xr 300 Mg Xr24h-Tab (Lamotrigine) .[name].. 1 By Mouth Once Daily 2)  Lorazepam 1 Mg  Tabs (Lorazepam) .... Take 1-3  Tablets By Mouth Once A Day As Needed 3)  Testim 1 %  Gel (Testosterone) .... Qd 5 G 4)  Aciphex 20 Mg  Tbec (Rabeprazole Sodium) .... Take 1 Each Day 30 Minutes Before Bed 5)  Ambien 10 Mg  Tabs (Zolpidem Tartrate) .... Take 1 Tablet By Mouth Once A Day At Bedtime 6)  Hizentra 20% 4 Gm/64ml Soln (Immune Globulin (Human)) .... 2 Every Week 7)  Micardis 80 Mg Tabs (Telmisartan) .... Take 1 Tablet By Mouth Once A Day 8)  Synthroid 150 Mcg Tabs (Levothyroxine Sodium) .[name].. 1 By Mouth Qd 9)  Pravastatin Sodium 20 Mg Tabs (Pravastatin Sodium) .... Take 1 Tablet By Mouth At Bedtime 10)  Flonase 50 Mcg/act Susp (Fluticasone Propionate) .... As Needed 11)  Various Pain Meds .... As Needed 12)  Amlodipine Besylate 5 Mg Tabs (Amlodipine Besylate) .... Take One Tablet By Mouth Daily  Allergies (verified): No Known Drug Allergies  Past History:  Past Surgical History: Last updated: 08/07/2009 right shoulder surgery x 2 2010 Dr Turner Daniels 2 basal cell carcinomas removed(right arm/ neck)metastasis Dr Judie Petit. 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 of syncope and sinus bradycardia Non-obstructive CAD     -Cardiac Catheterization-06/07. LAD 40% Palpitations     -PVCs H/o metastatic squamous cell carcinoma    -s/p resection/chemotherapy/radiation Cataracts Glaucoma Low back pain Dr Ethelene Hal Osteoarthritis Dr Titus Dubin Depression Dr Lolly Mustache GI Dr Samule Ohm in [facility] Anxiety  Review of Systems       The patient complains of anorexia, weight loss, severe indigestion/heartburn, and depression.  The patient denies fever, weight gain, vision loss, decreased hearing, hoarseness, chest pain, syncope, dyspnea on exertion, peripheral edema, prolonged cough, headaches, hemoptysis, abdominal pain, melena, hematochezia, hematuria, incontinence, genital sores, muscle weakness, suspicious skin lesions, transient blindness, difficulty walking, unusual weight change, abnormal bleeding, enlarged lymph nodes, angioedema, and testicular masses.         anxiety and stress  Physical Exam  General:  Gen: NAD HEENT: normal Neck: supple.  no JVD. Carotids 2+ bilat; not bruits. No lymphadenopathy or thryomegaly appreciated. Cor: PMI nondisplaced. Regular rate & rhythm. No rubs, gallops, murmur. Lungs: clear Abdomen: soft, nontender, nondistended. No hepatosplenomegaly. No bruits or masses. Good bowel sounds. Extremities: no cyanosis, clubbing, rash, edema Neuro: alert & orientedx3,  cranial nerves grossly intact. moves all 4 extremities w/o difficulty. affect pleasant  Eyes:  eyelids w/post-op deformities Mouth:  no thrush Msk:  Lumbar-sacral spine is tender to palpation over paraspinal muscles and painfull with the ROM  Extremities:  no edema B Neurologic:  alert & oriented X3 and gait normal.   Skin:  no rash Cervical Nodes:  No lymphadenopathy noted Inguinal Nodes:  No significant adenopathy Psych:  Oriented X3, not suicidal, and slightly to moderately anxious.  memory intact for recent and remote and not depressed appearing.     Impression & Recommendations:  Problem # 1:  HEALTH MAINTENANCE EXAM (ICD-V70.0) Assessment Improved Health and age related issues were discussed. Available screening tests and vaccinations were discussed as well. Healthy life style including good diet and exercise was discussed.  ,lab   Problem # 2:  DEPRESSION (ICD-311) Assessment: Unchanged He saw /Dr Lolly Mustache today and was given Seroquel His updated medication list for this problem includes:    Lorazepam 1 Mg Tabs (Lorazepam) .[name]... Take 1-3  tablets by mouth once a day as needed  Problem # 3:  HYPERTENSION, BENIGN (ICD-401.1) Assessment: Unchanged  His updated medication list for this problem includes:    Micardis 80 Mg Tabs (Telmisartan) .[name]... Take 1 tablet by mouth once a day    Amlodipine Besylate 5 Mg Tabs (Amlodipine besylate) .[name]... Take one tablet by mouth daily  BP today: 126/98 Prior BP: 152/98 (07/23/2010)  Prior 10 Yr Risk Heart Disease: N/A (02/21/2010)  Labs Reviewed: K+: 4.7 (07/20/2010) Creat: : 1.0 (07/20/2010)   Chol: 188 (07/20/2010)   HDL: 56.90 (07/20/2010)   LDL: 113 (07/20/2010)   TG: 91.0 (07/20/2010)  Problem # 4:  HYPOGONADISM, MALE (ICD-257.2) Assessment: Comment Only On the regimen of medicine(s) reflected in the chart    Problem # 5:  ANXIETY (ICD-300.00) Assessment: Deteriorated  His updated medication list for this problem includes:     Lorazepam 1 Mg Tabs (Lorazepam) .[name]... Take 1-3  tablets by mouth once a day as needed  Problem # 6:  ESOPHAGEAL STRICTURE (ICD-530.3) Assessment: Improved On the regimen of medicine(s) reflected in the chart    Complete Medication List: 1)  Lamictal Xr 300 Mg Xr24h-tab (Lamotrigine) .[name].. 1 by mouth once daily 2)  Lorazepam 1 Mg Tabs (Lorazepam) .... Take 1-3  tablets by mouth once a day as needed 3)  Testim 1 % Gel (Testosterone) .... Qd 5 g 4)  Aciphex 20 Mg Tbec (Rabeprazole sodium) .... Take 1 each day 30 minutes before bed 5)  Ambien 10 Mg Tabs (Zolpidem tartrate) .... Take 1 tablet by mouth once a day at bedtime 6)  Hizentra 20% 4 Gm/57ml Soln (Immune globulin (human)) .... 2 every week 7)  Micardis 80 Mg Tabs (Telmisartan) .... Take 1 tablet by mouth once a day 8)  Synthroid 150 Mcg Tabs (Levothyroxine sodium) .[name].. 1 by mouth qd 9)  Pravastatin Sodium 20 Mg Tabs (Pravastatin sodium) .... Take 1 tablet by mouth at bedtime 10)  Flonase 50 Mcg/act Susp (Fluticasone propionate) .... As needed 11)  Various Pain Meds  .... As needed 12)  Amlodipine Besylate 5 Mg Tabs (Amlodipine besylate) .... Take one tablet by mouth daily  Patient Instructions:  1)  Please schedule a follow-up appointment in 6 months  Prescriptions: ACIPHEX 20 MG  TBEC (RABEPRAZOLE SODIUM) Take 1 each day 30 minutes before bed  #90 Tablet x 4   Entered and Authorized by:   Tresa Garter MD   Signed by:   Tresa Garter MD on 08/08/2010   Method used:   Print then Give to Patient   RxID:   [id number]    Orders Added: 1)  Est. Patient 40-64 years [[id number]]

## 2010-09-13 NOTE — Progress Notes (Signed)
Summary: CALL A NURSE REPORT  Phone Note Other Incoming   Summary of Call: [facility]urse Triage Call Report Alex [name] Operator: [id number] Triage Record Num: Call Date & Time: [place] Patient Name: 10/28/2009 7:48:24PM PCP: [phone] Patient Phone: PCP Fax : Patient Gender: Male 1956-02-04 Patient DOB: Practice Name: [facility] Reason for Call: Pt calling about having severe left side pain @ bottom of rib cage. Onset: 3/19  ~ 3 hrs ago and is progressively getting worse. Advised to call 911. Back Symptoms Protocol(s) Used: Activate EMS 911 Recommended Outcome per Protocol: Reason for Outcome: Age 57 years or older with sudden onset of deep boring or tearing pain in back OR abdomen; may radiate to groin, hips or lower extremities Care Advice: Do not give the patient anything to eat or drink.  ~ Do not push on abdomen.  ~ Write down provider's name. List or place the following in a bag for transport with the patient: current prescription and/or OTC medications; alternative treatments, therapies and medications; and street drugs.  ~ An adult should stay with the patient, preferably one trained in CPR.  ~ IMMEDIATE ACTION  ~ Page 1 of 1 10/28/2009 7:59:58PM [id number] Initial call taken by: Lamar Sprinkles, CMA,  October 30, 2009 11:29 AM  Follow-up for Phone Call        Noted Follow-up by: Tresa Garter MD,  October 30, 2009 5:54 PM

## 2010-09-13 NOTE — Progress Notes (Signed)
Summary: Triage  Phone Note Call from Patient Call back at Home Phone [phone]   Caller: Patient Call For: Dr. Leone Payor Reason for Call: Talk to Nurse Summary of Call: diarrhea, stomach cramps x3days Initial call taken by: Karna Christmas,  May 30, 2010 9:53 AM  Follow-up for Phone Call        Multiple loose BM a day, cramping after meals, abdominal pain. Cramping worse in the am.  Patient  will come in and see Dr Leone Payor 06/06/10 2:15   Follow-up by: Darcey Nora RN, CGRN,  May 30, 2010 10:44 AM

## 2010-09-13 NOTE — Letter (Signed)
Summary: Colonoscopy Letter  [facility]  [address]   [place], [name] [address]   Phone: [phone]  Fax: [phone]      July 31, 2010 MRN: [id number]   Alex [name] [address] [place], [name]  [address]   Dear Mr. Shank,   According to your medical record, it is time for you to schedule a Colonoscopy. The American Cancer Society recommends this procedure as a method to detect early colon cancer. Patients with a family history of colon cancer, or a personal history of colon polyps or inflammatory bowel disease are at increased risk.  This letter has been generated based on the recommendations made at the time of your procedure. If you feel that in your particular situation this may no longer apply, please contact our office.  Please call our office at [phone] to schedule this appointment or to update your records at your earliest convenience.  Thank you for cooperating with [place] to provide you with the very best care possible.   Sincerely,   Stan Head, [name]  [facility] [phone]

## 2010-09-13 NOTE — Progress Notes (Signed)
Summary: bp high  Phone Note Call from Patient   Caller: Patient [phone] Reason for Call: Talk to Nurse Summary of Call: pt bp has been high even on med-pls advise Initial call taken by: Glynda Jaeger,  June 29, 2010 8:10 AM  Follow-up for Phone Call        Left message to call back Meredith Staggers, RN  June 29, 2010 3:59 PM   153/107 yest rested 161/106 took 1/2 of losartan and lorazepam came down to 154/104, this AM 152/103 then 138/99 he admits that he did eat a lot of soup yest. that was high in salt, but he notes his BP has been running high for a while, he was changed from Micardis 80mg  once daily to losartan in May due to ins. and wants to change back.  He felt the Micardis did a great job and that he hasn't had the same effects w/losartan.  Called ins. at [phone] and got Micardis approved, pt is aware and rx sent in, he will cont. to monitor BP and let me know if it cont. to run high Meredith Staggers, RN  June 29, 2010 4:48 PM     New/Updated Medications: MICARDIS 80 MG TABS (TELMISARTAN) Take 1 tablet by mouth once a day Prescriptions: MICARDIS 80 MG TABS (TELMISARTAN) Take 1 tablet by mouth once a day  #90 x 3   Entered by:   Meredith Staggers, RN   Authorized by:   Dolores Patty, MD, [facility]   Signed by:   Meredith Staggers, RN on 06/29/2010   Method used:   [facility]        [phone]       [address] [place], [name]  [address]       Ph: [phone] or [phone]       Fax: [phone]   RxID:   [id number]

## 2010-09-13 NOTE — Progress Notes (Signed)
Summary: Pre- Treadmill test  Phone Note Outgoing Call Call back at [facility] Phone [phone]   Call placed by: Stanton Kidney, EMT-P,  February 20, 2010 2:23 PM Call placed to: Patient Action Taken: Phone Call Completed Summary of Call: Reviewed information about treadmill test appt.  Spoke with Patient.

## 2010-09-13 NOTE — Progress Notes (Signed)
Summary: pt needs 90day supply of new meds  Phone Note Call from Patient Call back at Home Phone [phone]   Caller: Patient Reason for Call: Talk to Nurse, Talk to Doctor Summary of Call: pt will use the new b/p med so plz [facility] him [facility] [address] Initial call taken by: Omer Jack,  May 30, 2010 4:11 PM  Follow-up for Phone Call        Pt needed refill on Losartan.  Refill sent.  Also scheduled pt for yearly appt 07/23/10. Mylo Red RN    Prescriptions: Claris Gladden POTASSIUM 100 MG TABS (LOSARTAN POTASSIUM) Take 1 tablet by mouth once a day  #90 x 3   Entered by:   Lisabeth Devoid RN   Authorized by:   Dolores Patty, MD, [facility]   Signed by:   Lisabeth Devoid RN on 05/30/2010   Method used:   [facility]        [phone]ail[facility]       [address] [place], [name]  [address]       Ph: [phone] or [phone]       Fax: [phone]   RxID:   [id number]

## 2010-09-13 NOTE — Letter (Signed)
Summary: [facility]  [facility]   Imported By: Sherian Rein 07/30/2010 12:35:28  _____________________________________________________________________  External Attachment:    Type:   Image     Comment:   External Document

## 2010-09-13 NOTE — Assessment & Plan Note (Signed)
Summary: dysphagia/sheri   History of Present Illness Visit Type: Follow-up Visit Primary GI MD: Stan Head MD [facility] Primary Provider: Sula Soda, MD Chief Complaint: esophageal stricture,urgent bowel movements after meals History of Present Illness:   55 yo white man with known esophageal ring last dilated in 2009. Over the past 8 weeks he has had impact dysphagia again. He has modified his diet with some success. Also c/o urgent defecation, a gastrointestinal virus did go through the house. Now having frequent urgent bowel movements, not liquid (semi-formed)but sometimes only gas and small amount of stool. Usually post-prandial. Did get 2 rounds of antibiotics for sinus infection, last was Zpak in February.   GI Review of Systems    Reports abdominal pain, acid reflux, bloating, dysphagia with liquids, dysphagia with solids, and  loss of appetite.     Location of  Abdominal pain: upper abdomen.    Denies belching, chest pain, heartburn, nausea, vomiting, vomiting blood, weight loss, and  weight gain.      Reports change in bowel habits and  irritable bowel syndrome.     Denies anal fissure, black tarry stools, constipation, diarrhea, diverticulosis, fecal incontinence, heme positive stool, hemorrhoids, jaundice, light color stool, liver problems, rectal bleeding, and  rectal pain. Preventive Screening-Counseling & Management      Drug Use:  no.      EGD  Procedure date:  06/16/2008  Findings:      1) DISTAL ESOPHAGEAL RING DILATED 52 FR 2) NON-EROSIVE GASTRITIS  3) OTHERWISE NORMAL  Colonoscopy  Procedure date:  06/16/2008  Findings:      1) SIGMOID DIVERTICULOSIS 2) OTHERWISE NORMAL COLONOSCOPY TO CECUM 3) EXCELLENT PREP 4) FAMILY HX COLON CANCER AND PERSONAL HX COMMON VARIABLE IMMUNE DEFICIENCY   Current Medications (verified): 1)  Lamictal 200 Mg  Tabs (Lamotrigine) .... Take 1 Tablet By Mouth Once A Day 2)  Salagen 5 Mg  Tabs (Pilocarpine Hcl) .... Take 1  Tablet By Mouth Once A Day 3)  Lorazepam 1 Mg  Tabs (Lorazepam) .... Take 1 Tablet By Mouth Once A Day As Needed 4)  Testim 1 %  Gel (Testosterone) .... Qd 5 G 5)  Aciphex 20 Mg  Tbec (Rabeprazole Sodium) .... Take 1 Each Day 30 Minutes Before Bed 6)  Ambien 10 Mg  Tabs (Zolpidem Tartrate) .... Take 1 Tablet By Mouth Once A Day At Bedtime 7)  Vivaglobin 160 Mg/ml  Soln (Immune Globulin (Human)) .... Subcutaneously Q 1 Wk 8)  Micardis 80 Mg  Tabs (Telmisartan) .... Take 1 Tablet By Mouth Once A Day 9)  Analpram-Hc 1-1 % Crea (Hydrocortisone Ace-Pramoxine) .... Use Twice Daily Per Rectum As Needed 10)  Synthroid 150 Mcg Tabs (Levothyroxine Sodium) .[name].. 1 By Mouth Qd 11)  Pravastatin Sodium 20 Mg Tabs (Pravastatin Sodium) .... Take 1 Tablet By Mouth At Bedtime 12)  Flonase 50 Mcg/act Susp (Fluticasone Propionate) .... As Needed  Allergies (verified): No Known Drug Allergies  Past History:  Past Medical History: Reviewed history from 08/07/2009 and no changes required. 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 of syncope and sinus bradycardia Non-obstructive CAD     -Cardiac Catheterization-06/07. LAD 40% Palpitations     -PVCs H/o metastatic squamous cell carcinoma    -s/p resection/chemotherapy/radiation Cataracts Glaucoma Low back pain Dr Ethelene Hal Osteoarthritis Dr Titus Dubin Depression Dr Lolly Mustache  Past Surgical History: Reviewed history from 08/07/2009 and no changes required. right shoulder surgery  x 2 2010 Dr Turner Daniels 2 basal cell carcinomas removed(right arm/ neck)metastasis Dr Judie Petit. R ankle tendon surgery 2009  Family History: Family History Hypertension Family History of Rectal Cancer:Sister  Family History of Colon Polyps:Mother Lung Cancer: Brother Family History of Prostate Cancer:Father, Brother  Social  History: Occupation: Married Never Smoked 7 children Alcohol Use - no Daily Caffeine Use Illicit Drug Use - no Drug Use:  no  Review of Systems       The patient complains of allergy/sinus, anxiety-new, arthritis/joint pain, back pain, cough, and sleeping problems.         riding bike again   Vital Signs:  Patient profile:   55 year old male Height:      73 inches Weight:      178.38 pounds BMI:     23.62 Pulse rate:   84 / minute Pulse rhythm:   regular BP sitting:   110 / 80  (left arm) Cuff size:   regular  Vitals Entered By: June McMurray CMA Duncan Dull) (November 30, 2009 1:56 PM)  Physical Exam  General:  Well developed, well nourished, no acute distress. Lungs:  Clear throughout to auscultation. Heart:  Regular rate and rhythm; no murmurs, rubs,  or bruits. Abdomen:  Soft, nontender and nondistended. No masses, hepatosplenomegaly or hernias noted. Normal bowel sounds. Neurologic:  Alert and  oriented x4;  grossly normal neurologically.   Impression & Recommendations:  Problem # 1:  ESOPHAGEAL STRICTURE (ICD-530.3) Assessment Deteriorated  recurrent dysphagia needs repeat EGD/dili and consider changing PPI - also timing of PPI  Orders: EGD SAV (EGD SAV)  Problem # 2:  CHANGE IN BOWELS (BMW-413.24) Assessment: New Loose and urgent stools after 2 rounds of antibotics for sinus infection empiric metronidazole (side effects discussed) would not do probiotic with immune deficiency  Patient Instructions: 1)  Chew food well while waiting for EGD. 2)  Your EGD is scheduled for 12/12/2009 please arrive at 2pm on the 4th floor of the [facility].  3)  Copy sent to : Sula Soda, MD 4)  Upper Endoscopy brochure given.  5)  Your RX has been sent to your pharmacy.  6)  The medication list was reviewed and reconciled.  All changed / newly prescribed medications were explained.  A complete medication list was provided to the patient /  caregiver. Prescriptions: METRONIDAZOLE 250 MG  TABS (METRONIDAZOLE) Take 1 three times a day times 10 days  #30 x 0   Entered and Authorized by:   Iva Boop MD, [facility]   Signed by:   Iva Boop MD, FACG on 11/30/2009   Method used:   [facility]        [facility]  [phone]* (retail)       [address] [place], [name]  [address]       Ph: [phone] or [phone]       Fax: [phone]   RxID:   [id number] METRONIDAZOLE 250 MG  TABS (METRONIDAZOLE) Take 1 three times a day times 10 days  #30 x 0   Entered and Authorized by:   Iva Boop MD, [facility]   Signed by:   Iva Boop MD, FACG on 11/30/2009   Method used:   Historical   RxID:   [id number]

## 2010-09-13 NOTE — Letter (Signed)
Summary: [facility]   Imported By: Sherian Rein 07/30/2010 12:34:17  _____________________________________________________________________  External Attachment:    Type:   Image     Comment:   External Document

## 2010-09-13 NOTE — Procedures (Signed)
Summary: [facility]   Imported By: Sherian Rein 08/10/2010 09:09:59  _____________________________________________________________________  External Attachment:    Type:   Image     Comment:   External Document

## 2010-09-13 NOTE — Progress Notes (Signed)
Summary: ABNORMAL LABS  Phone Note Call from Patient   Summary of Call: Patient is requesting order for CBC. Pt had labs from Dr Smiley Houseman, [facility] on 06/22/10. He is concerned about elevated wbc which was 14.0.  Initial call taken by: Lamar Sprinkles, CMA,  July 18, 2010 4:07 PM  Follow-up for Phone Call        ok cbc 995.20 needs ov Follow-up by: Tresa Garter MD,  July 18, 2010 5:38 PM  Additional Follow-up for Phone Call Additional follow up Details #1::        Pt has cpx 12/28. OK to do all cpx labs earlier than one wk before? Does pt need apt prior than 12/28 cpx apt regarding these abnormal labs?  Additional Follow-up by: Lamar Sprinkles, CMA,  July 18, 2010 5:56 PM    Additional Follow-up for Phone Call Additional follow up Details #2::    ok Follow-up by: Tresa Garter MD,  July 19, 2010 12:10 PM  Additional Follow-up for Phone Call Additional follow up Details #3:: Details for Additional Follow-up Action Taken: Pt informed Additional Follow-up by: Lamar Sprinkles, CMA,  July 19, 2010 12:47 PM    -  Date:  06/22/2010    WBC: 14.0    HGB: 17.0    HCT: 50.1    RBC: 6.21    PLT: 289    MCV: 80.7    RDW: 15.9    BG Random: 138    BUN: 20    Creatinine: 1.09    Sodium: 138    Potassium: 4.2    Chloride: 103    CO2 Total: 21    Calcium: 8.8    Total Testosterone: 529.96    TSH: 4.382    PSA: 0.82

## 2010-09-13 NOTE — Progress Notes (Signed)
Summary: Nurse Visit  Phone Note Call from Patient Call back at Home Phone [phone]   Caller: Patient Reason for Call: Talk to Nurse Summary of Call: pt needs paper work done for work. pt states it only needs his blood pressure taken by a nurse and signature of the dr. pt wants to know when its the best time to bring it by, Initial call taken by: Roe Coombs,  August 27, 2010 9:57 AM  Follow-up for Phone Call        I spoke with the pt and he needs a form completed for his work.  The pt needs his BP checked and a waste measurement.  The form has to be signed by Dr Gala Romney.  Nurse Visit scheduled on 08/31/10 at 11:00. Follow-up by: Julieta Gutting, RN, BSN,  August 27, 2010 10:25 AM

## 2010-09-13 NOTE — Progress Notes (Signed)
Summary: FYI  Phone Note Call from Patient   Summary of Call: FYI, pt's pain has not gotten better, Went to [facility] sunday. He has apt with ORTHO today for further eval.  Initial call taken by: Lamar Sprinkles, CMA,  October 31, 2009 1:37 PM  Follow-up for Phone Call        Noted Follow-up by: Tresa Garter MD,  October 31, 2009 5:57 PM

## 2010-09-13 NOTE — Assessment & Plan Note (Signed)
Summary: BP and waste measurement/lwb  Nurse Visit   Vital Signs:  Patient profile:   55 year old male Pulse rate:   91 / minute BP sitting:   121 / 87  (right arm) Comments Pt came in for BP check and needed waist measured for Employer wellness program.  p/w completed and faxed. Denied any changes in meds or health status.    Current Problems (verified): 1)  Health Maintenance Exam  (ICD-V70.0) 2)  Anxiety  (ICD-300.00) 3)  Chest Pain  (ICD-786.50) 4)  COPD  (ICD-496) 5)  Change in Bowels  (ICD-787.99) 6)  Esophageal Stricture  (ICD-530.3) 7)  Depression  (ICD-311) 8)  Cbc, Abnormal  (ICD-790.99) 9)  Osteoarthritis  (ICD-715.90) 10)  Low Back Pain  (ICD-724.2) 11)  Generalized Osteoarthrosis Unspecified Site  (ICD-715.00) 12)  Hypertension, Benign  (ICD-401.1) 13)  Hypertension, Benign  (ICD-401.1) 14)  Hyperlipidemia-mixed  (ICD-272.4) 15)  Cad, Native Vessel  (ICD-414.01) 16)  Foot Pain  (ICD-729.5) 17)  Elevated Bp  (ICD-796.2) 18)  Epidydimitis/orchitis  (ICD-604.90) 19)  Rash-nonvesicular  (ICD-782.1) 20)  Fatigue  (ICD-780.79) 21)  Cough  (ICD-786.2) 22)  Sinusitis- Acute-nos  (ICD-461.9) 23)  Vasectomy, Hx of  (ICD-V26.52) 24)  Thyroidectomy, Hx of  (ICD-V45.79) 25)  Coronary Atherosclerosis  (ICD-414.00) 26)  Low Back Pain, Chronic  (ICD-724.2) 27)  Thrush  (ICD-112.0) 28)  Common Variable Immunodeficiency  (ICD-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 1 By Mouth Once Daily 2)  Lorazepam 1 Mg  Tabs (Lorazepam) .... Take 1-3  Tablets By Mouth Once A Day As Needed 3)  Testim 1 %  Gel (Testosterone) .... Qd 5 G 4)  Aciphex 20 Mg  Tbec (Rabeprazole Sodium) .... Take 1 Each Day 30 Minutes Before Bed 5)   Ambien 10 Mg  Tabs (Zolpidem Tartrate) .... Take 1 Tablet By Mouth Once A Day At Bedtime 6)  Hizentra 20% 4 Gm/80ml Soln (Immune Globulin (Human)) .... 2 Every Week 7)  Micardis 80 Mg Tabs (Telmisartan) .... Take 1 Tablet By Mouth Once A Day 8)  Synthroid 150 Mcg Tabs (Levothyroxine Sodium) .[name].. 1 By Mouth Qd 9)  Pravastatin Sodium 20 Mg Tabs (Pravastatin Sodium) .... Take 1 Tablet By Mouth At Bedtime 10)  Flonase 50 Mcg/act Susp (Fluticasone Propionate) .... As Needed 11)  Various Pain Meds .... As Needed 12)  Amlodipine Besylate 5 Mg Tabs (Amlodipine Besylate) .... Take One Tablet By Mouth Daily  Allergies (verified): No Known Drug Allergies

## 2010-09-13 NOTE — Letter (Signed)
Summary: [facility]   Imported By: Sherian Rein 04/11/2010 10:10:02  _____________________________________________________________________  External Attachment:    Type:   Image     Comment:   External Document

## 2010-09-13 NOTE — Letter (Signed)
Summary: EGD Instructions  [facility]  [address] [place], [name] [address]   Phone: [phone]  Fax: [phone]       Alex [name]    04/16/1956    MRN: [id number]       Procedure Day /Date: 12/12/2009 Tuesday     Arrival Time: 2:00pm     Procedure Time: 3:00pm     Location of Procedure:                    X  [facility] (4th Floor)   PREPARATION FOR ENDOSCOPY   On 12/12/2009 THE DAY OF THE PROCEDURE:  1.   No solid foods, milk or milk products are allowed after midnight the night before your procedure.  2.   Do not drink anything colored red or purple.  Avoid juices with pulp.  No orange juice.  3.  You may drink clear liquids until 1:00pm, which is 2 hours before your procedure.                                                                                                CLEAR LIQUIDS INCLUDE: Water Jello Ice Popsicles Tea (sugar ok, no milk/cream) Powdered fruit flavored drinks Coffee (sugar ok, no milk/cream) Gatorade Juice: apple, white grape, white cranberry  Lemonade Clear bullion, consomm, broth Carbonated beverages (any kind) Strained chicken noodle soup Hard Candy   MEDICATION INSTRUCTIONS  Unless otherwise instructed, you should take regular prescription medications with a small sip of water as early as possible the morning of your procedure.        OTHER INSTRUCTIONS  You will need a responsible adult at least 57 years of age to accompany you and drive you home.   This person must remain in the waiting room during your procedure.  Wear loose fitting clothing that is easily removed.  Leave jewelry and other valuables at home.  However, you may wish to bring a book to read or an iPod/MP3 player to listen to music as you wait for your procedure to start.  Remove all body piercing jewelry and leave at home.  Total time from sign-in until discharge is approximately 2-3 hours.  You should go home directly after  your procedure and rest.  You can resume normal activities the day after your procedure.  The day of your procedure you should not:   Drive   Make legal decisions   Operate machinery   Drink alcohol   Return to work  You will receive specific instructions about eating, activities and medications before you leave.    The above instructions have been reviewed and explained to me by   _______________________    I fully understand and can verbalize these instructions _____________________________ Date _________

## 2010-09-13 NOTE — Progress Notes (Signed)
Summary: Call Report  Phone Note Other Incoming   Caller: [facility]-A-Nurse Summary of Call: [facility] Triage Call Report Triage Record Num: [id number] Operator: Hillary Bow Patient Name: Alex [name] Call Date & Time: 06/20/2010 9:48:21PM Patient Phone: [phone] PCP: Sonda Primes Patient Gender: Male PCP Fax : [phone] Patient DOB: 08-Feb-1956 Practice Name: [facility] Reason for Call: Pt calling about epigastric abdominal pain after eating subway, [place] sub, onset 06-20-10. Pt immediately had BM, green in color and loose. Pt has had Myalox. Afebrile. All emergent sxs r/o per Abdominal Pain Protocol. Advised Pt to f/u w/ office on 06-21-10 for appt d/t aching abdominal pain, possible food poisoning. Home advice given. Protocol(s) Used: Abdominal Pain Recommended Outcome per Protocol: See Provider within 24 hours Reason for Outcome: New onset constant mild or aching lower abdominal pain Care Advice:  ~ Soak in warm bath or place heating pad set on low on abdomen to aid in pain relief.  ~ Rest as much as possible.  ~ Pain medication or laxatives should not be taken until symptoms are evaluated. Call provider immediately if pain gets worse, localized to one spot or lasts more than 4 hours and prevents normal activity; tell provider if vomiting begins after onset of pain or if having any fever.  ~  ~ SYMPTOM / CONDITION MANAGEMENT 11/ Initial call taken by: Margaret Pyle, CMA,  June 21, 2010 8:29 AM  Follow-up for Phone Call        OV w/any MD if problems Follow-up by: Tresa Garter MD,  June 22, 2010 9:08 AM

## 2010-09-13 NOTE — Progress Notes (Signed)
Summary: Rf Septra  Phone Note From Pharmacy   Summary of Call: rec rf Request for gen Septra DS 1 bid # 28.  This is not on active med list. Ok to RF? Initial call taken by: Lanier Prude, [facility]),  March 27, 2010 10:50 AM  Follow-up for Phone Call        ok to ref Follow-up by: Tresa Garter MD,  March 27, 2010 2:00 PM  Additional Follow-up for Phone Call Additional follow up Details #1::        rf sent Additional Follow-up by: Lanier Prude, [facility]),  March 27, 2010 3:14 PM

## 2010-09-13 NOTE — Progress Notes (Signed)
Summary: Septra Rf  Phone Note From Pharmacy   Summary of Call: rec rf request for Septra DS. 1 by mouth two times a day # 28. Ok to RF? Last OV was 07/2009. Initial call taken by: Lanier Prude, [facility]),  July 02, 2010 8:07 AM  Follow-up for Phone Call        ok to ref needs OV Follow-up by: Tresa Garter MD,  July 02, 2010 1:14 PM    Prescriptions: SMZ-TMP DS 800-160 MG TABS (SULFAMETHOXAZOLE-TRIMETHOPRIM) 1 two times a day  #28 x 0   Entered by:   Lamar Sprinkles, CMA   Authorized by:   Tresa Garter MD   Signed by:   Lamar Sprinkles, CMA on 07/02/2010   Method used:   Electronically to        [phone]* (retail)       [address] [place], [name]  [address]       Ph: [phone] or [phone]       Fax: [phone]   RxID:   [id number]

## 2010-09-21 ENCOUNTER — Encounter (HOSPITAL_COMMUNITY): Payer: BC Managed Care – PPO | Admitting: Licensed Clinical Social Worker

## 2010-09-21 DIAGNOSIS — F319 Bipolar disorder, unspecified: Secondary | ICD-10-CM

## 2010-10-03 ENCOUNTER — Encounter (HOSPITAL_COMMUNITY): Payer: BC Managed Care – PPO | Admitting: Psychiatry

## 2010-10-03 DIAGNOSIS — F319 Bipolar disorder, unspecified: Secondary | ICD-10-CM

## 2010-10-04 ENCOUNTER — Encounter (HOSPITAL_COMMUNITY): Payer: Self-pay | Admitting: Licensed Clinical Social Worker

## 2010-10-19 ENCOUNTER — Encounter (HOSPITAL_COMMUNITY): Payer: BC Managed Care – PPO | Admitting: Licensed Clinical Social Worker

## 2010-10-19 DIAGNOSIS — F319 Bipolar disorder, unspecified: Secondary | ICD-10-CM

## 2010-10-21 ENCOUNTER — Emergency Department (HOSPITAL_COMMUNITY)
Admission: EM | Admit: 2010-10-21 | Discharge: 2010-10-21 | Disposition: A | Discharge status: Home / Self Care | Payer: BC Managed Care – PPO | Attending: Emergency Medicine | Admitting: Emergency Medicine

## 2010-10-21 ENCOUNTER — Emergency Department (HOSPITAL_COMMUNITY): Payer: BC Managed Care – PPO

## 2010-10-21 DIAGNOSIS — I10 Essential (primary) hypertension: Secondary | ICD-10-CM | POA: Insufficient documentation

## 2010-10-21 DIAGNOSIS — Y9355 Activity, bike riding: Secondary | ICD-10-CM | POA: Insufficient documentation

## 2010-10-21 DIAGNOSIS — IMO0002 Reserved for concepts with insufficient information to code with codable children: Secondary | ICD-10-CM | POA: Insufficient documentation

## 2010-10-21 DIAGNOSIS — R079 Chest pain, unspecified: Secondary | ICD-10-CM | POA: Insufficient documentation

## 2010-10-21 DIAGNOSIS — F411 Generalized anxiety disorder: Secondary | ICD-10-CM | POA: Insufficient documentation

## 2010-10-21 DIAGNOSIS — Y929 Unspecified place or not applicable: Secondary | ICD-10-CM | POA: Insufficient documentation

## 2010-10-21 DIAGNOSIS — M25559 Pain in unspecified hip: Secondary | ICD-10-CM | POA: Insufficient documentation

## 2010-10-21 DIAGNOSIS — S51009A Unspecified open wound of unspecified elbow, initial encounter: Secondary | ICD-10-CM | POA: Insufficient documentation

## 2010-10-21 DIAGNOSIS — S7000XA Contusion of unspecified hip, initial encounter: Secondary | ICD-10-CM | POA: Insufficient documentation

## 2010-10-21 DIAGNOSIS — Z85850 Personal history of malignant neoplasm of thyroid: Secondary | ICD-10-CM | POA: Insufficient documentation

## 2010-10-21 DIAGNOSIS — Z79899 Other long term (current) drug therapy: Secondary | ICD-10-CM | POA: Insufficient documentation

## 2010-11-02 ENCOUNTER — Encounter (HOSPITAL_COMMUNITY): Payer: BC Managed Care – PPO | Admitting: Licensed Clinical Social Worker

## 2010-11-02 DIAGNOSIS — F319 Bipolar disorder, unspecified: Secondary | ICD-10-CM

## 2010-11-14 ENCOUNTER — Encounter (HOSPITAL_COMMUNITY): Payer: BC Managed Care – PPO | Admitting: Psychiatry

## 2010-11-14 DIAGNOSIS — F319 Bipolar disorder, unspecified: Secondary | ICD-10-CM

## 2010-11-19 ENCOUNTER — Encounter (HOSPITAL_COMMUNITY): Payer: BC Managed Care – PPO | Admitting: Licensed Clinical Social Worker

## 2010-11-19 DIAGNOSIS — F319 Bipolar disorder, unspecified: Secondary | ICD-10-CM

## 2010-11-21 ENCOUNTER — Telehealth: Payer: Self-pay | Admitting: Internal Medicine

## 2010-11-21 NOTE — Telephone Encounter (Signed)
PA requested for Aciphex 11/19/2010 PA form requested [facility] [id number] PA form received & forwarded to MD for completion 11/20/2010 Completed PA form faxed for approval 11/20/2010 Approval received & faxed to pharmacy  11/21/10 Pt informed.

## 2010-11-26 LAB — BASIC METABOLIC PANEL
BUN: 19 mg/dL (ref 6–23)
CO2: 29 mEq/L (ref 19–32)
Calcium: 8.9 mg/dL (ref 8.4–10.5)
Chloride: 100 mEq/L (ref 96–112)
Creatinine, Ser: 1.09 mg/dL (ref 0.4–1.5)
GFR calc Af Amer: >60 mL/min (ref >60)
GFR calc non Af Amer: >60 mL/min (ref >60)
Glucose, Bld: 115 mg/dL — ABNORMAL HIGH (ref 70–99)
Potassium: 4 mEq/L (ref 3.5–5.1)
Sodium: 136 mEq/L (ref 135–145)

## 2010-11-26 LAB — POCT HEMOGLOBIN-HEMACUE: Hemoglobin: 17 g/dL (ref 13.0–17.0)

## 2010-12-03 ENCOUNTER — Encounter (HOSPITAL_COMMUNITY): Payer: BC Managed Care – PPO | Admitting: Licensed Clinical Social Worker

## 2010-12-03 DIAGNOSIS — F319 Bipolar disorder, unspecified: Secondary | ICD-10-CM

## 2010-12-17 ENCOUNTER — Encounter (HOSPITAL_COMMUNITY): Payer: BC Managed Care – PPO | Admitting: Licensed Clinical Social Worker

## 2010-12-17 DIAGNOSIS — F319 Bipolar disorder, unspecified: Secondary | ICD-10-CM

## 2010-12-25 NOTE — Assessment & Plan Note (Signed)
[facility]                            CARDIOLOGY OFFICE NOTE   ABDON, PETROSKY                     MRN:          [id number]  DATE:03/10/2007                            DOB:          02/09/1956    PRIMARY CARE PHYSICIAN:  Dr. [name] Critchley Plotnikov.   GASTROENTEROLOGIST:  Dr. Stan Head.   REASON FOR VISIT:  Routine cardiac followup.   HISTORY OF PRESENT ILLNESS:  Mr. South is a former patient of Dr. Glennon Hamilton.  He has a previously documented history of nonobstructive  coronary atherosclerosis with a 40% stenosis within the left anterior  descending by cardiac catheterization in June 2007.  Additional history  includes previously documented premature ventricular complexes and prior  bradycardia.  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  He also has an immune deficiency (common variable immune  deficiency) and is followed at [facility].  From  a cardiac perspective, he is denying any problems with significant  palpitations.  He is not reporting any angina.  He states that he swims  on a regular basis.  His electrocardiogram today is normal, showing  sinus rhythm at 83 beats per minute.  I reviewed his recent cholesterol  numbers, revealing an increase in his LDL from 99 up to 134.  He did  have a switch in statin therapy from Vytorin to Pravachol 20 mg daily in  the interim.  Liver function tests were normal.   ALLERGIES:  No known drug allergies.   PRESENT MEDICATIONS:  1. AcipHex 20 mg p.o. daily.  2. Synthroid 175 mcg p.o. daily.  3. Micardis 80 mg p.o. daily.  4. Pravachol 20 mg p.o. nightly.  5. Lamictal 200 mg p.o. daily.  6. Testim 1% patch.  7. Lithium 300 mg p.o. daily.   REVIEW OF SYSTEMS:  As recorded in the history of present illness.   PHYSICAL EXAMINATION:  Blood pressure today is 120/72.  Heart rate is  83.  Weight is 187 pounds.  The patient is comfortable and in no acute  distress.  NECK:  Examination reveals no elevated jugular venous pressure or  carotid bruits.  LUNGS:  Generally clear.  No labored breathing at rest.  CARDIAC:  Exam reveals a regular rate and rhythm, no S3 gallop or  pericardial rub.  EXTREMITIES:  Exhibit no significant pitting edema.   IMPRESSION AND RECOMMENDATION:  1. Previously documented history of nonobstructive coronary      atherosclerosis.  No active angina or dyspnea.  I have encouraged      regular exercise and he will continue his present medical regimen.      From the perspective of risk factor modification, his LDL has      increased.  I have asked him to increase his Pravachol to 40 mg      p.o. nightly and will have followup liver function and lipid panel      in 12 weeks.  Ideally, LDL control  should be around 70.  Otherwise,      he is not having any problems with palpitations and his      electrocardiogram is normal.  2. Follow up in 6 months.     Jonelle Sidle, MD  Electronically Signed    SGM/MedQ  DD: 03/10/2007  DT: 03/11/2007  Job #: [id number]   cc:   [name], MD  Iva Boop, MD,FACG

## 2010-12-25 NOTE — Assessment & Plan Note (Signed)
[facility]                         GASTROENTEROLOGY OFFICE NOTE   THURMON, MIZELL                     MRN:          [id number]  DATE:03/20/2007                            DOB:          03-29-1956    CHIEF COMPLAINT:  Odynophagia, dysphagia, question of recurrent  esophageal candidiasis.   ASSESSMENT:  1. 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  Diagnosed 2007.  Recurrent similar episode last      month, treated empirically with response.  He is now having      recurrent symptoms.  2. Esophageal stricture (ring) with previous dilations by Dr. Victorino Dike.  3. Hiatal hernia.  4. 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  5. Status post thyroidectomy for thyroid cancer.  6. Chronic low back pain.  7. Status post removal of at least 2 basal cell carcinomas.  8. Dyslipidemia.  9. Prostatitis.  10.Prior vasectomy.  11.Testosterone deficiency.  12.Coronary atherosclerosis (nonobstructive).   HISTORY:  I had spoken to him on the phone when I was on call a month or  so ago, and he had problems with odynophagia consistent with previously  diagnosed esophageal candidiasis, so I prescribed a course of Diflucan.  That helped tremendously, but he is starting to have some problems  again.  He has also had some problems consistent with his esophageal  ring acting up prior to that.   MEDICATIONS:  Listed and reviewed in the chart.   There are no known drug allergies.   He was on Aciphex.   PHYSICAL EXAMINATION:  He is in no acute distress.  Weight 183 pounds, pulse 72, blood pressure 118/86.  Pharynx is clear.   ASSESSMENT:  Dysphagia and odynophagia with a history of esophageal  candidiasis and esophageal stricture.  He does not have any thrush  today, but he is pretty convinced that it is esophageal candidiasis  again.   It sounds like he is a reliable historian.  That is certainly  possible.  He could have HSV or CMV, too, I suppose, though it sounds  more like candida to me.  He has a common variable immune deficiency.  He took IgG infusions, and thinks they are probably wearing off.  He is  being followed by the immune specialist at [facility].   PLAN:  Go ahead and retreat with Diflucan over 10 days with a refill if  he needs it.  Set up endoscopy if possible, set up dilation for  September.  If everything resolves completely, he can cancel that, but  it sounds like there is both esophageal candidiasis and esophageal ring  in play here.     Iva Boop, MD,FACG  Electronically Signed    CEG/MedQ  DD: 03/20/2007  DT: 03/21/2007  Job #: [id number]   cc:   Valentino Hue. Magrinat, [name]  Jonelle Sidle, MD  Georgina Quint Plotnikov, MD

## 2010-12-25 NOTE — Letter (Signed)
January 10, 2009    Chalmers Guest, [name]  [facility]  [address]  [place], [place] [address]   RE:  MALCOME, AMBROCIO  MRN:  [id number]  /  DOB:  06-19-1956   Dear Channing Mutters,   I am writing this letter regarding Mr. [name] Patin.  He is a 55 year old  male with a history of nonobstructive coronary artery disease,  hypertension and hyperlipidemia.  As I understand you have been  following him for glaucoma.  He has been treated by Combigan eye drops.  Unfortunately, he has been unable to tolerate this due to significant  palpitations which have been quite symptomatic for him.  It appears that  there may be a surgical procedure which he may qualify for which would  relieve the need for him to take the Combigan. Given his palpitations, I  would support any surgical measures that were appropriate to help him  with his glaucoma and also keep him off of the Combigan.   Please do not hesitate to call me with any questions.  I appreciate any  help you can give [name].   Bevelyn Buckles. Bensimhon, MD  co-director Heart Failure Clinic  [facility]    Sincerely,      Bevelyn Buckles. Bensimhon, MD  Electronically Signed   DRB/MedQ  DD: 01/10/2009  DT: 01/10/2009  Job #: [id number]

## 2010-12-25 NOTE — Assessment & Plan Note (Signed)
[facility]                         GASTROENTEROLOGY OFFICE NOTE   COLUMBUS, ICE                     MRN:          [id number]  DATE:04/20/2007                            DOB:          1956/02/10    CHIEF COMPLAINT:  Left lower quadrant pain.   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 Lately he has had a  little more trouble where it is bother his bowels or he has what he  calls spastic bowel movements with increased bowel movements. He saw Dr.  Retta Diones who had tried him on amitriptyline. That put the patient in a  fog for about 2 days that he was taking and he says he just cannot take  that because he has to function better. He does not know what dose he  started on i.e. 10 versus 25. Right now he feels okay. He had a  colonoscopy in 2005 by Dr. Corinda Gubler that revealed no diverticulosis and  he has had CT scanning through Dr. Retta Diones that has been okay. The  pain kind of radiates into the right groin. He has been dealing with  this but he says that lately the bowels have been acting up more. There  was no bleeding or anything like that. He says irritable bowel syndrome  ran in his family. He did note that if he took some hydrocodone, half of  a 7.5 mg tablet that he got some relief. This is a problem about twice a  month for several days. He has been offered Lyrica but has been  concerned about the side effects. He has been on Cymbalta in the past  for other reasons through Dr. Nolen Mu and I do not think that seemed to  help. He is due to have an EGD with dilation of esophageal stricture  with me in the next week or so it sounds like.   PAST MEDICAL HISTORY:  Reviewed and unchanged from my recent note of  March 20, 2007.   MEDICATIONS:  Listed and reviewed in the chart. He is on Lamictal  through Dr. Nolen Mu.   PHYSICAL EXAMINATION:  VITAL SIGNS:  Height 6 feet, weight 186 pounds,  pulse 84, blood pressure 156/98.  ABDOMEN:  Soft and nontender. I can see his previous hernia scar. There  is no pain or tenderness today, there is no recurrent hernia in the  supine or standing position.  The genitalia looked normal as well.   Note, he has done reading and thinks that it could be related to his  mesh from his hernia perhaps and is wondering if he should go see Dr.  Georgana Curio.   ASSESSMENT:  Recurrent left lower quadrant pain, sounds like it is  neuropathic, there may be some overlap of irritable bowel syndrome.   PLAN:  His options are:  1. Treat it with intermittent narcotics and if he did that a couple of      times a month with a few pills I think that  would be okay.  2. Retry a tricyclic at a lower does, will try something like Lyrica      to take every day and prevent these problems.  3. Consider trying dicyclomine intermittently 20 mg every 6 hours.   He wants to try the dicyclomine. He may go see Dr. Gerrit Friends. I do not know  that the dicyclomine would help the neuropathic pain but perhaps it  would relieve the associated irritable bowel symptoms that I think are  related to that. I do not think he needs an imaging study or a  colonoscopy based upon what is known today. We will see how he does over  time. This sounds like a chronic recurrent problem with some new bowel  complaints but they were temporarily associated with a typical flare of  what he calls his neuropathic pain.     Iva Boop, MD,FACG  Electronically Signed    CEG/MedQ  DD: 04/20/2007  DT: 04/20/2007  Job #: [id number]   cc:   [name], [name]  [name], MD  Valentino Hue Magrinat, [name]  [name], MD  Andee Poles, [name]

## 2010-12-25 NOTE — Assessment & Plan Note (Signed)
[facility]                                 ON-CALL NOTE   FREMAN, LAPAGE                       MRN:          [id number]  DATE:02/25/2007                            DOB:          21-Aug-1955    Mr. Davidian called the answering service and I returned his call earlier  this evening.  He is a man with a history of 2 malignancies and a  history of a T-cell deficiency.  A year or so ago he had odynophagia and  it turned out to be Candida esophagitis.  He tells me he is having the  identical symptoms to that now.  This has been going on a couple of  days.  He is not describing fever or vomiting or other problems.  I went  ahead and prescribed fluconazole 100 mg a day for 10 days.  He is to  call the office for a followup appointment to review things.  The other  possibility is he is having dysphagia.  He has previously had GERD and  reflux problems.  But, he was fairly convinced that this was an  identical symptom complex to the problems with Candida esophagitis as  last year.     Iva Boop, MD,FACG  Electronically Signed    CEG/MedQ  DD: 02/25/2007  DT: 02/26/2007  Job #: [id number]

## 2010-12-25 NOTE — Op Note (Signed)
NAMEMESHILEM, MACHUCA              ACCOUNT NO.:  [id number]   MEDICAL RECORD NO.:  [id number]          PATIENT TYPE:  AMB   LOCATION:  DSC                          FACILITY:  [facility]   PHYSICIAN:  Leonides Grills, [name]     DATE OF BIRTH:  11/16/1955   DATE OF PROCEDURE:  04/05/2008  DATE OF DISCHARGE:                               OPERATIVE REPORT   PREOPERATIVE DIAGNOSES:  1. Right anterior ankle impingement.  2. Right sinus tarsi impingement.  3. Subluxing right peroneal tendons.  4. Right peroneus longus tendon tear.   POSTOPERATIVE DIAGNOSES:  1. Right anterior ankle impingement.  2. Right sinus tarsi impingement.  3. Subluxing right peroneal tendons.  4. Right peroneus longus tendon tear.   OPERATION:  1. Right ankle arthroscopy with extensive debridement.  2. Right subtalar arthroscopy with debridement.  3. Right peroneus longus to peroneus brevis tendon transfer.  4. Repair of subluxing peroneal tendons without fibular osteotomy.  5. Right sural nerve neurolysis.   ANESTHESIA:  General.   SURGEON:  Leonides Grills, MD   ASSISTANT:  Richardean Canal, PA-C   ESTIMATED BLOOD LOSS:  Minimal.   TOURNIQUET TIME:  Approximately 45 minutes.   COMPLICATIONS:  None.   DISPOSITION:  Stable to PR.   INDICATIONS:  This is a 55 year old male who has had persistent  anterolateral and posterolateral ankle pain that was interfering with  his life to the point he could not do what he wants to do in the face of  a cavus foot.  It was discussed with him to possibly do a dorsiflexion  first metatarsal osteotomy, but after consulting with the patient about  this, he did not want to proceed with this at this point and possibly do  this down the road if this does become problem despite wearing a cavus  foot type orthotic.  For now, we will proceed with the above procedure.  All risks which include infection or vessel injury, persistent pain,  worsening pain, prolonged recovery, stiffness,  arthritis, ankle  instability, possibility of persistent pain due to the cavus foot that  may require dorsiflexion first metatarsal osteotomy and a possible Dwyer  lateralizing calcaneal osteotomy were all explained.  Questions were  encouraged and answered.   OPERATION:  The patient was brought to the operating room and placed in  supine position after adequate general anesthesia was administered as  well as Ancef 1 g IV piggyback.  A bump was placed in the right  ipsilateral hip, and the right lower extremity was prepped and draped in  a sterile manner over proximally placed thigh tourniquet.  Limb was  gravity exsanguinated.  Tourniquet was elevated to 290 mmHg.  A  curvilinear incision midline over the lateral malleolus peroneal tendons  were then made.  Dissection was carried down through the skin.  Hemostasis was obtained.  The superior and inferior peroneal retinaculum  were incised after a formal sural nerve neurolysis was then performed,  and sural nerve was retracted out of harm's way for the remaining part  of the procedure.  The retinaculum was incised superiorly about 2-3 mm  off the posterolateral edge of the lateral malleolus, distally the  inferior peroneal retinaculum was released.  The peroneal tubercle was  also removed as well off the calcaneus using a rongeur.  Once this was  done, this was nice and rounded off.  There was a tear distally within  the peroneus longus tendon around the bed of peroneal tubercle.  Due to  the fact that he had a cavus foot and the tear, we elected to transfer  the peroneus longus to peroneus brevis.  The repair of the tear was  incorporated in the transfer using 2-0 FiberWire stitch.  This had an  Conservation officer, historic buildings.  We then created a trough on the posterolateral  aspect of the lateral malleolus using curved quarter-inch osteotome  followed by a rongeur.  We then advanced the superior retinaculum into  this trough using drill holes and  2-0 FiberWire stitch.  Again, this had  an outstanding repair.  Prior to this, the tendons were copiously  irrigated with normal saline and that peroneus longus tendon was  transferred to the peroneus brevis with the ankle in neutral  dorsiflexion.  This again had an outstanding repair.  The tourniquet was  deflated.  Hemostasis was obtained.  The area was copiously irrigated  with normal saline.  Subcu was closed with 3-0 Vicryl.  Skin was closed  with 4-0 nylon.  We then placed a spinal needle into the subtalar joint  through the sinus tarsi area.  Normal saline 20 mL was instilled into  the area.  A nick-and-spread technique was then used to create the  anterolateral portal.  Blunt tip trocar with cannula followed by camera  was then placed into the subtalar joint.  There was a large amount of  synovitis anterolaterally right on the edge of the interosseous ligament  towards the edge of the anterior lateral edge of the posterior facet of  the subtalar joint.  An another portal was created just anterior to the  tip of the lateral malleolus using a nick-and-spread technique, and a  shaver followed by radiofrequency bevel was placed into this area to  debride the inflamed soft tissue.  Once this was completely debrided,  the subtalar joint was ranged and there was no impingement over this  area.  Pictures were obtained throughout the procedure.  Camera was  removed.  We then traced out the anatomical landmarks including anterior  tibialis tendon, peroneus tertius tendon, and a superficial peroneal  nerve.  A spinal needle was then placed just medial to the anterior  tibialis tendon.  Normal saline 20 mL was instilled into the ankle.  Nick-and-spread technique was then used to create the anteromedial  portal medial to the anterior tibialis tendon.  Blunt tip trocar with  cannula followed by camera was then placed in the ankle and under direct  visualization, anterolateral portal was created  lateral to the peroneus  tertius tendon and superficial peroneal nerve.  There was a very robust  accessory tib-fib ligament with local synovitis around the ligament.  This was debrided with a radiofrequency bevel as well as a shaver where  this was rubbing the anterolateral corner of the talar dome.  There was  actually a spot of arthritis in this area that was down to bone.  It was  not a formal osteochondral lesion.  For this reason, we did not do a  debridement of the area.  When we ranged the ankle, however, and once  the accessory tib-fib ligament  and the synovitis was removed, there was  no impinging area and this localized arthritic area was not in contact  with any part of the ankle joint.  The lateral gutter was free of  pathology once the synovitis was removed.  Care was taken not to violate  the anterior talofibular ligament.  We then placed the camera  anterolaterally visualizing anteromedially.  There was a small area of  synovitis anteromedially, but very minimal, and this was debrided as  well.  Remaining part of cartilage was looked to be without any obvious  osteochondral lesion.  Pictures were obtained throughout the procedure.  Camera was removed.  Wounds were closed with a 4-0 nylon stitch.  Sterile dressing was applied.  Modified Jones dressing was applied with  the ankle in dorsiflexion.  The patient was stable to the PR.      Leonides Grills, [name]  Electronically Signed     PB/MEDQ  D:  04/05/2008  T:  04/06/2008  Job:  [id number]

## 2010-12-25 NOTE — Assessment & Plan Note (Signed)
[facility]                            CARDIOLOGY OFFICE NOTE   Alex [name], Alex [name]                     MRN:          [id number]  DATE:01/14/2008                            DOB:          1956/07/24    PRIMARY CARE PHYSICIAN:  Dr. [name]   REASON FOR VISIT:  Routine cardiac follow-up.   HISTORY OF PRESENT ILLNESS:  I saw Alex [name] last year.  He is a  former patient of Dr. Corinda Gubler and has nonobstructive coronary disease,  documented at catheterization in June of 2007.  We have been following  him on medical therapy.  His history is detailed in my previous note.  He is not reporting any problem with angina.  He does have occasional  sense of palpitations but nothing prolonged or troublesome.  His  electrocardiogram shows normal sinus rhythm at 71 beats per minute  today.  He has not had recent lipids and this is being arranged.  I  reviewed his medications.  Has not been taking his aspirin regularly.  His blood pressure is also elevated today, although he reports  compliance with his Micardis.  We talked about this some as well.   ALLERGIES:  No known drug allergies.   MEDICATIONS:  1. Aciphex 20 mg p.o. daily.  2. Synthroid 75 mcg p.o. daily.  3. Micardis 80 mg p.o. daily.  4. Lamictal 200 mg p.o. daily.  5. Pravachol 40 mg p.o. at bedtime.   REVIEW OF SYSTEMS:  As in History of Present Illness.  He has had some  trouble with inflammation/tendon pain possibility related to his immune  deficiency.  This is undergoing management through [facility].  He has had no frank myalgias.  No syncope, no  exertional chest pain.  No fevers or chills.  Otherwise negative.   Examination, blood pressure 140/100 rechecked 135/90.  Heart rate 71.  Weight 185 pounds.  The patient is in no acute distress.  NECK:  Reveals no elevated jugular pressure, no loud bruits.  LUNGS:  Clear without labored breathing.  CARDIAC:   Exam reveals a regular rate and rhythm.  No S3, gallop or rub.  EXTREMITIES:  Exhibit no frank pitting edema.   IMPRESSION/RECOMMENDATIONS:  History of nonobstructive coronary  atherosclerosis with plan for risk factor modification.  He is  asymptomatic from the perspective of angina.  I have asked him to start  taking his aspirin daily and record his blood pressure at home as he may  need further titration of his Micardis or perhaps another agent.  His  lipids need followup and this will be arranged as well as liver function  tests.  He is otherwise continuing on Pravachol at 40 mg daily.  He has  not been on beta blocker therapy long term.  We will plan to followup in  6  months and I will arrange this with Dr. Gala Romney given my transition to  the [facility].     Jonelle Sidle, MD  Electronically Signed    SGM/MedQ  DD: 01/14/2008  DT: 01/14/2008  Job #:  [id number]   cc:   [name], MD

## 2010-12-25 NOTE — Op Note (Signed)
[name], Alex [name]              ACCOUNT NO.:  [id number]   MEDICAL RECORD NO.:  [id number]          PATIENT TYPE:  AMB   LOCATION:  DSC                          FACILITY:  [facility]   PHYSICIAN:  Leonides Grills, [name]     DATE OF BIRTH:  05-25-1956   DATE OF PROCEDURE:  05/12/2007  DATE OF DISCHARGE:                               OPERATIVE REPORT   PREOPERATIVE DIAGNOSIS:  Left ankle impingement.   POSTOPERATIVE DIAGNOSIS:  Left ankle impingement.   OPERATION:  Left ankle arthroscopy with extensive debridement.   ANESTHESIA:  General.   SURGEON:  Dr. Leonides Grills.   ASSISTANT:  Evlyn Kanner, PA-C.   ESTIMATED BLOOD LOSS:  Minimal.   TOURNIQUET TIME:  None.   COMPLICATIONS:  None.   DISPOSITION:  Stable to PR.   INDICATIONS:  This is a 55 year old male who has had anterior  anterolateral left ankle pain that was interfering with his life  __________  despite conservative management. He has also a cavus foot.  He was consented for the above procedure.  All risks which include  infection, nerve or vessel injury, persistent pain, worsening pain,  prolonged recovery, stiffness and arthritis and the need for an orthotic  in the future to balance his foot better, to decrease the lateral column  overload were all explained, questions were encouraged and answered. I  also explained to him that he can develop sinus formation, synovial cyst  formation.   OPERATION:  The patient was brought to the operating room, placed in  supine position initially after adequate block with sedation was  administered as well as Ancef 1 gram IV piggyback.  The patient was then  placed in a sloppy lateral position with the operative side up on a  beanbag.  All bony prominences were well padded. The left lower  extremity was then prepped and draped in a sterile manner.  No  tourniquet was used.  Anatomical landmarks include anterior tibialis  tendon,  peroneus tertius then made, superficial peroneal  nerve could  not be seen. A spinal needle was then placed into the ankle just medial  to the anterior tibialis tendon. 20 mL of normal saline was instilled in  the ankle, nick and spread technique was then utilized to create the  anteromedial portal. A blunt-tip trocar with cannula followed by camera  was then placed in the ankle under direct visualization. The  anterolateral portal was then created just lateral to peroneus tertius  tendon.  This was done by illuminating from inside out making sure that  there was no superficial peroneal in this area. This was done using a  nick and spread technique. There was a large amount of synovitis in the  anterior lateral aspect of the ankle with an accessory tib-fib ligament  type structure impinging this area as well. With a radiofrequency bevel,  this was extensively debrided. The synovitis extended into the lateral  gutter and this was also debrided as well.  We also removed synovitis  from the anterior aspect of the ankle. We then placed a scope  anterolaterally and looked anteromedially and there  was a small amount  of synovitis as well. There were no obvious osteochondral lesions.  Pictures were obtained throughout the procedure. Cameras removed, the  wound was closed with 4-0 nylon stitch, sterile dressing applied.  A Cam  walker boot was applied.  The patient was stable to PR.      Leonides Grills, [name]  Electronically Signed     PB/MEDQ  D:  05/12/2007  T:  05/12/2007  Job:  [id number]   cc:   [name], MD

## 2010-12-27 ENCOUNTER — Telehealth: Payer: Self-pay | Admitting: *Deleted

## 2010-12-27 NOTE — Telephone Encounter (Signed)
Needs OV Thx 

## 2010-12-27 NOTE — Telephone Encounter (Signed)
Patient c/o increase appetite, metallic taste in his mouth and fatigue x 3 to 4 days. No new meds. Please advise.

## 2010-12-27 NOTE — Telephone Encounter (Signed)
Left pt vm to call office and schedule apt for EVAL

## 2010-12-28 ENCOUNTER — Telehealth: Payer: Self-pay | Admitting: *Deleted

## 2010-12-28 NOTE — Discharge Summary (Signed)
NAME:  Alex [name], Alex [name]                        ACCOUNT NO.:  [id number]   MEDICAL RECORD NO.:  [id number]                   PATIENT TYPE:  IPS   LOCATION:  0307                                 FACILITY:  [facility]   PHYSICIAN:  Jeanice Lim, [name]              DATE OF BIRTH:  February 16, 1956   DATE OF ADMISSION:  07/25/2003  DATE OF DISCHARGE:  07/26/2003                                 DISCHARGE SUMMARY   IDENTIFYING DATA:  This is a 55 year old married Caucasian male, voluntarily  admitted with a history of squamous cell carcinoma, head and back, referred  by oncologist after telling members of the cancer center that he had  thoughts of suicide.  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   ADMISSION MEDICATIONS:  Synthroid, Effexor. Ativan, Micardis, Pravachol and  multivitamin.   PHYSICAL EXAMINATION:  Essentially within normal limits, neurologically  nonfocal.   ROUTINE ADMISSION LABS:  Within normal limits.   ALLERGIES:  No known drug allergies.  No known food allergies.   MENTAL STATUS EXAM:  Fully alert, no acute distress, pleasant and  cooperative with anxious affect.  Speech normal, mood depressed, anxious,  worried.  Thought process goal directed, no flight of ideas, has made  specific statements regarding concern about the patient's job which he is  ruminating about and quite worried about losing, feared that if he was sole  breadwinner of the family and lost his insurance that he would be in  significant trouble.  The patient reported suicidal ideation without plan  and no psychotic symptoms.   ADMISSION DIAGNOSES:   AXIS I:  Major depressive disorder, severe.   AXIS II:  Deferred.   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   AXIS IV:  Moderate to severe.  Problems related to medical problems, limited  support system, occupational problems, and financial stresses, as well as  fear of losing insurance.   AXIS V:  30/60.   HOSPITAL COURSE:  The patient was admitted and ordered routine p.r.n.  medications, underwent further monitoring, and was encouraged to participate  in individual, group and milieu therapy.  The patient was optimized on  Effexor, targeting depressive symptoms and Seroquel p.r.n. agitation, placed  on safety checks.  The patient participated in therapy and aftercare  planning, described his situation at work quite well, showing very good  insight, feeling that he would benefit from a therapist and that his fears  were quite realistic and that the best thing for him to do and most  therapeutic  would be for him to return to work and he denied any suicidal  thoughts, just felt that the situation was desperate.  The patient admitted  that he had been mildly depressed and was happy to take medications and to  follow up with therapy and medication management.   CONDITION ON DISCHARGE:  Improved.  Mood was less depressed, affect  brighter, thought process goal directed.  Thought content negative for  dangerous ideation or psychotic symptoms.   DISCHARGE MEDICATIONS:  The patient was discharged to continue previous  medications, along with Ativan 1 mg 1/2 q.6 p.r.n. panic and 1 mg 1-2 q.h.s.  p.r.n. insomnia, Effexor XR 75 mg q.a.m. and  a trial of Seroquel 25 mg 1/2-  1 2 hours before sleep p.r.n. insomnia.   DISPOSITION:  The patient was to discuss with the primary care physician  psychiatric medications management follow-up visit and evaluation and call  Dr. Juanita Laster if the appointment needs to be changed.  The patient was also  to follow up with [facility] December 16 at 4:30 and Dr. Juanita Laster   December 21 and 2:30.   DISCHARGE DIAGNOSES:   AXIS I:  Major depressive disorder, severe.   AXIS II:  Deferred.   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   AXIS IV:  Moderate to severe.  Problems related to medical problems, limited  support system, occupational problems, and financial stresses, as well as  fear of losing insurance.   AXIS V:  Global assessment of function on discharge was 55.                                               Jeanice Lim, [name]    JEM/MEDQ  D:  08/24/2003  T:  08/25/2003  Job:  [id number]

## 2010-12-28 NOTE — Discharge Summary (Signed)
[facility]  Patient:    Alex [name], Alex [name] Visit Number: [id number] MRN: [id number]          Service Type: SUR Location: 3W 0380 01 Attending Physician:  Bonnetta Barry Dictated by:   [name], [name] Admit Date:  11/03/2001 Discharge Date: 11/05/2001   CC:         Jeannett Senior A. Evlyn Kanner, [name]  Valentino Hue. Magrinat, [name]  [name], [name]   Discharge Summary  REASON FOR ADMISSION:  Papillary thyroid carcinoma.  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  CT scan demonstrated a nodular mass in the thyroid.  Fine needle aspiration was performed.  This showed cytologic findings consistent with papillary thyroid carcinoma.  The patient now comes to surgery for thyroidectomy.  HOSPITAL COURSE:  The patient was admitted to [facility] on March 25.  He was taken directly to the operating room where he underwent total thyroidectomy.  Postoperative course was complicated by hypocalcemia and urinary retention.  He was seen on consultation by Dr. Ardyth Harps and Willow Ora, respectively.  The patient stabilized without symptoms from hypocalcemia.  His urinary retention resolved.  He was prepared for discharge home on the second postoperative day.  DISCHARGE PLANNING:  The patient was discharged home on November 05, 2001, in good condition, tolerating a regular diet, and ambulating independently.  The patient will be seen back in my office at [facility] in two weeks.  The patient will be seen at Dr. Aliene Beams office in 10 days for TSH level and calcium level determinations.  DISCHARGE MEDICATIONS:  Include Tylox as needed for pain and Os-Cal with vitamin D.  FINAL DIAGNOSIS:  Papillary thyroid carcinoma.  CONDITION UPON DISCHARGE:  Good. Dictated by:   [name], [name] Attending Physician:  Bonnetta Barry DD:   11/05/01 TD:  11/06/01 Job: [id number]

## 2010-12-28 NOTE — H&P (Signed)
Alex [name], Alex [name]              ACCOUNT NO.:  [id number]   MEDICAL RECORD NO.:  [id number]          PATIENT TYPE:  OBV   LOCATION:  0102                         FACILITY:  [facility]   PHYSICIAN:  Antony Contras, MD     DATE OF BIRTH:  1955-12-22   DATE OF ADMISSION:  08/07/2005  DATE OF DISCHARGE:                                HISTORY & PHYSICAL   CHIEF COMPLAINT:  Right-sided epistaxis.   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 Osborn Coho for trouble with sphenoid sinusitis.  At about 10:15  p.m. on August 06, 2005, he developed acute onset of right-sided epistaxis  out the front of his nose but also down his throat.  EMS was called, and he  was taken to [facility] emergency room by EMS.  At the emergency room,  bleeding could not be controlled by emergency room staff, and a consultation  was called.  Alex [name] had an examination and imaging in August that  showed him to be cancer free that included CT scanning and PET scanning.  He  denies any new orbital symptoms.   PAST MEDICAL HISTORY:  1.  Orbital cancer.  2.  GERD.  3.  Hypertension.  4.  Hyperlipidemia.  5.  Hypothyroidism.   PAST SURGICAL HISTORY:  1.  Septoplasty in November by Dr. Osborn Coho.  2.  Right neck dissection.  3.  Ocular plastic surgery on the right eye.   MEDICATIONS:  1.  Micardis 40 mg p.o. daily.  2.  Pravachol 20 mg p.o. daily.  3.  Aciphex 20 mg p.o. daily.  4.  Synthroid 175 mcg p.o. daily.  5.  Salagen 7.5 mg p.o. daily and as needed.  6.  Cymbalta 30 mg p.o. daily.  7.  Amoxicillin 500 mg p.o. q.8h.   ALLERGIES:  No known drug allergies.   FAMILY HISTORY:  Noncontributory.   SOCIAL HISTORY:  The patient lives locally and is married with seven  children.  He denies smoking,  alcohol, or drug use.   REVIEW OF SYSTEMS:  He has a recent diagnosis of right otitis media and has  been on amoxicillin for this.  Other systems are negative.   PHYSICAL EXAMINATION:  VITAL SIGNS:  Temperature is 97.6, blood pressure  141/99, pulse 88, and respirations 18.  GENERAL:  The patient is alert and oriented and in moderate distress  associated with nasal bleeding.  The bleeding was controlled before the  remainder of the examination which follows.  HEENT:  Oral cavity and oropharynx exam:  There is no mass or ulceration in  the mouth.  There was bleeding in the oropharynx.  Nasal exam:  The patient  had bright red blood coming from the right nasal passage as well as from his  mouth.  Suction of both nasal passages revealed no source of bleeding  anteriorly.  Ear exam:  Tympanic membranes are intact,  and external auditory  canals are clear.  Both middle ear spaces appear to have a serous effusion  with layered blood in the bottom of the mesotympanum on both sides.  There  is no active inflammation or infection.  NECK:  The right neck has an incision scar, and there is no mass in the  neck.   LABORATORIES:  White blood count 8.8, hemoglobin 14.1, platelets 289.  Sodium 137, potassium 3.9, chloride 104, bicarb 24, BUN 14, creatinine 1,  glucose 141.   ASSESSMENT:  Alex [name] is a 55 year old white male with a history of right  orbital cancer treated with radiation therapy as well as a neck dissection  and also with a recent history of septoplasty in November by Dr. Osborn Coho who developed acute onset of right epistaxis earlier this evening.   Bleeding was controlled in the right nose by suctioning the nose and  applying Afrin nasal spray in both sides of the nose.  Ultimately, an 8-cm  Merocel pack was placed in the right side of the nose after coating it with  bacitracin ointment and has controlled bleeding.   PLAN:  The patient will be admitted for overnight  observation.  Because of  his history of orbital cancer and his valid concern about recurrence causing  the bleeding, I have agreed that the best line of action will be to take him  to the operating room in the morning for nasal endoscopy to try to identify  the site of bleeding and get it controlled primarily.  At the same time, an  assessment of the nasal passage can be performed that would lead to  potential biopsy if there are concerning areas.  In the meantime, he will be  admitted to the hospital and put on Keflex while the nasal pack is in place.  Afrin and saline nasal sprays will be applied to the nose.  Home medicines  will be continued.      Antony Contras, MD  Electronically Signed     DDB/MEDQ  D:  08/07/2005  T:  08/07/2005  Job:  [id number]

## 2010-12-28 NOTE — Op Note (Signed)
Alex [name], Alex [name]              ACCOUNT NO.:  [id number]   MEDICAL RECORD NO.:  [id number]          PATIENT TYPE:  AMB   LOCATION:  DSC                          FACILITY:  [facility]   PHYSICIAN:  Feliberto Gottron. Turner Daniels, [name]   DATE OF BIRTH:  Sep 21, 1955   DATE OF PROCEDURE:  09/19/2008  DATE OF DISCHARGE:  09/05/2008                               OPERATIVE REPORT   PREOPERATIVE DIAGNOSES:  Right shoulder impingement syndrome,  acromioclavicular joint arthritis, and chondromalacia of the  glenohumeral joint.   POSTOPERATIVE DIAGNOSIS:  Right shoulder impingement syndrome,  acromioclavicular joint arthritis, and chondromalacia of the  glenohumeral joint.   PROCEDURES:  Right shoulder arthroscopic anterior-inferior  acromioplasty, formal distal clavicle excision and debridement of  chondromalacia from the glenohumeral joint, grade 3 as well as some of  degenerative tearing of the labrum.   SURGEON:  Feliberto Gottron. Turner Daniels, MD   FIRST ASSISTANT:  Shirl Harris, PA-C   ANESTHESIA:  Interscalene block right plus general endotracheal.   ESTIMATED BLOOD LOSS:  Minimal.   FLUID REPLACEMENT:  1500 mL of crystalloid.   DRAINS PLACED:  None.   TOURNIQUET TIME:  None.   INDICATIONS FOR PROCEDURE:  A 54 year old man with chronic catching and  pain in his right shoulder who has previously had an arthroscopic  decompression a few years ago by another physician.  He desires elective  evaluation and treatment of his right shoulder arthroscopic having  failed conservative measures with anti-inflammatory medicines, cortisone  injections, and still has fairly impressive catching, popping, and pain.  On the axillary lateral view of the x-ray, there was some evidence of  glenohumeral arthritis with loss of cartilage space.  He has had an MRI  scan showing continued inflammation of the rotator cuff.  Risks and  benefits of surgery were discussed and questions answered.   DESCRIPTION OF PROCEDURE:  The  patient was identified by armband, taken  to the block area at [facility] where the appropriate  anesthetic monitors were attached and right interscalene block  anesthetic induced.  He was then taken to the operating room where the  appropriate anesthetic monitors were reattached and general endotracheal  anesthesia was induced with the patient in supine position.  He was then  placed in the beach-chair position and the right upper extremity was  prepped and draped in the usual sterile fashion from the wrist to the  hemithorax.  At this point, a standard time-out procedure was performed.  Then using a #11 blade, standard portals were made 1.5 cm anterior to  the [facility] joint lateral to the junction of middle and posterior thirds  acromion and posterior to the posterolateral corner of the acromion  process.  The arthroscope was placed laterally, the inflow anteriorly,  and a 4.2 [place] shaver posteriorly.  This allowed [place] to  identify the arthritic AC joint, the residual subacromial spur, and the  external leaflets of the rotator cuff which were maybe 5% torn.  Using a  4.5 hooded vortex bur, we then created a type 1 flat acromion, switched  portals, and removed the  distal 1 cm of the clavicle and made  photographic documentation of the distal clavicle excision.  At this  point, the arthroscope was repositioned into the glenohumeral joint  using the posterior portal where we documented a grade 3 chondromalacia  with flap tears of the humeral head, fairly global as well as some grade  3 chondromalacia changes of the glenoid.  These were debrided back to a  stable margin.  Degenerative tearing of the labrum was also removed and  the shoulder was thoroughly irrigated out with normal saline solution.  The internal leaflets of the rotator cuff were noted to be intact.  At  this point, the arthroscopic instruments were removed.  A dressing of  Xeroform, 4 x 4s dressing,  sponges, paper tape, and a sling applied.  The patient was laid supine and then awakened and taken to the recovery  room without difficulty.      Feliberto Gottron. Turner Daniels, [name]  Electronically Signed     FJR/MEDQ  D:  09/19/2008  T:  09/19/2008  Job:  [id number]

## 2010-12-28 NOTE — Op Note (Signed)
NAMEHERLEY, BERNARDINI              ACCOUNT NO.:  [id number]   MEDICAL RECORD NO.:  [id number]          PATIENT TYPE:  OIB   LOCATION:  5709                         FACILITY:  [facility]   PHYSICIAN:  Beulah Gandy. Ashley Royalty, [name] DATE OF BIRTH:  [birth year]   DATE OF PROCEDURE:  11/06/2004  DATE OF DISCHARGE:                                 OPERATIVE REPORT   ADMISSION DIAGNOSIS:  Radiation retinopathy with vitreous hemorrhage.   PROCEDURES:  Pars plana vitrectomy, retinal photocoagulation, gas injection  in the right eye.   SURGEON:  Beulah Gandy. Ashley Royalty, [name]   ASSISTANT:  Rosalie Doctor, MA   ANESTHESIA:  General.   DETAILS:  Usual prep and drape. The 4 mL marker at 8, 10 and 2 o'clock and  25 gauge trocars placed in these locations. The infusion at 8 o'clock.  Provisc placed on the corneal surface. BIOM viewing system moved into place.  Pars plana vitrectomy was begun just behind the crystalline lens. Blood  mixed with vitreous was encountered. Membranes were encountered and peeled  with the vitreous cutter. The vitrectomy was carried down to 6 o'clock where  dark red blood was encountered. This was removed and improved viewing was  obtained with scleral depression at 6 o'clock and 7 o'clock. All blood and  vitreous was removed from the vitreous cavity. The endolaser was positioned  in the eye with the directional laser probe. 702 burns placed around the  retinal periphery. The power was between 800 and 1000 milliwatts, 1000  microns each and 0.1 seconds each. A gas fluid exchange was performed. The  trocars and instruments removed from the eye. No leakage was seen from any  sclerotomy point. Decadron 10 milligrams was injected into the lower  subconjunctival space. Polymyxin and gentamicin were irrigated into tenons  space. Marcaine was injected around the globe for postop pain. The closing  pressure was less than 10 [place] tonometer. Complications none. Duration  1/2 hour. The patient was  awakened, taken to recovery in satisfactory  condition.      JDM/MEDQ  D:  11/06/2004  T:  11/06/2004  Job:  [id number]

## 2010-12-28 NOTE — Op Note (Signed)
Alex [name], Alex [name]              ACCOUNT NO.:  [id number]   MEDICAL RECORD NO.:  [id number]          PATIENT TYPE:  OBV   LOCATION:  1322                         FACILITY:  [facility]   PHYSICIAN:  Antony Contras, MD     DATE OF BIRTH:  1955/08/17   DATE OF PROCEDURE:  08/07/2005  DATE OF DISCHARGE:                                 OPERATIVE REPORT   PREOPERATIVE DIAGNOSES:  1.  Right epistaxis.  2.  History of right orbital carcinoma, status post radiation therapy.   POSTOPERATIVE DIAGNOSES:  1.  Right epistaxis.  2.  History of right orbital carcinoma, status post radiation therapy.  3.  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   PROCEDURE:  Bilateral nasal endoscopy with inferior turbinate debridement  and cautery of right inferior turbinate.   ANESTHESIA:  General endotracheal anesthesia.   COMPLICATIONS:  None.   INDICATIONS:  The patient is a 55 year old white male with a history of  orbital carcinoma, treated with radiation therapy and requiring a neck  dissection for metastatic disease.  He also recently underwent septoplasty  by Dr. Annalee Genta in November.  At about 10:15 last night, he experienced  acute onset of right-sided bleeding from his nose and down his throat.  He  came to the emergency department where anterior bleeding was not seen and a  Merocel pack was placed, controlling the bleeding.  Because of his concerns  about recurrent carcinoma and the significant bleeding that he experienced,  he is brought to the operating room this morning for nasal endoscopy with  the plan to control epistaxis.   FINDINGS AT SURGERY:  Upon removal of the Merocel pack, the nasal endoscope  was inserted into both nasal passages and demonstrated necrosis of the  anterior end of both inferior turbinates.  The right side had active  bleeding with removal of the pack.  No additional areas of necrosis were  noted and there was no other source of bleeding that was noted.   The right  sphenopalatine artery was clearly seen but no bleeding was seen in that  region.  The right inferior turbinate specimen was sent for permanent  section.  The left inferior turbinate specimen was sent for frozen section  and revealed necrosis with pus with a single fungal hypha demonstrated but  no significant fungal involvement.  There were atypical squamous cells that  were seen that may be consistent with radiation therapy.  No definite  malignancy was identified.   DESCRIPTION OF PROCEDURE:  The patient was identified in the holding room,  and with informed consent having been obtained, the patient was moved to the  operative suite and put on the operating table int he supine position.  Anesthesia was induced by the anesthesia team without difficulty.  The  patient was given intravenous antibiotics and steroids during the case.  The  eyes were lubricated and the bed was turned 90 degrees from anesthesia.  The  face was prepped and draped in a sterile fashion and the eyes taped closed  with Steri-Strips.  The right nasal pack was removed and  Afrin-soaked  pledgets were placed in both sides of the nose for several minutes.  After  removal of the pledgets, a 0 degree telescope was inserted into both sides  of the nose to evaluate the nose.  No active bleeding was seen on the left  side.  The findings were noted above.  On the right side, active bleeding  was seen of the anterior inferior turbinate in the area of necrosis.  There  was no additional bleeding seen.  At this point, the right inferior  turbinate was injected with 1% lidocaine with 1:100,000 of epinephrine.  The  Takahashi forceps was then used to debride the area of necrosis and  specimens were collected for pathology.  A turbinate scissors was used to  remove the inferior portion of the anterior inferior turbinate on the right  side.  This was also sent to pathology.  A Freer elevator was then used to  elevate the  remaining mucosa off of the underlying bone and the anterior  portion of the inferior turbinate bone was removed in piecemeal fashion.  Suction Bovie was then used to cauterize active bleeding in the inferior  turbinate and thrombin-soaked Gelfoam was then placed against the fresh  edge.  The left side was then reinspected with the 0 degree telescope and  injected with 1% lidocaine with 1:100,000 of epinephrine in the inferior  turbinate.  Takahashi forceps was then used to debride the necrotic tissue  without penetrating the soft tissues down to the bone.  This tissue was sent  to pathology for frozen section as described above.  Thrombin-soaked Gelfoam  was placed against the debrided area.  At this point, the right nasal  passage was again inspected with the 0 degree and then 70 degree telescopes,  looking for any other source of bleeding.  No additional source was noted  and the thrombin-soaked Gelfoam was removed due to a little bit of active  bleeding and additional cautery is performed.  Thrombin-soaked Gelfoam was  then placed again in the region and no active bleeding was seen.  The throat  was then suctioned out and a nasogastric tube passed down the esophagus to  suck out the stomach and esophagus.  At this point, the results of the  frozen section were called in and the decision was made to terminate the  procedure.  At this point, the patient was suctioned again in the throat,  turned back to anesthesia for wake up.  He was extubated and moved to the  recovery room in stable condition.      Antony Contras, MD  Electronically Signed     DDB/MEDQ  D:  08/07/2005  T:  08/07/2005  Job:  [id number]

## 2010-12-28 NOTE — Cardiovascular Report (Signed)
Alex [name], Alex [name] NO.:  [id number]   MEDICAL RECORD NO.:  [id number]          PATIENT TYPE:  INP   LOCATION:  2016                         FACILITY:  [facility]   PHYSICIAN:  Jonelle Sidle, [name] LHCDATE OF BIRTH:  [birth year]   DATE OF PROCEDURE:  01/16/2006  DATE OF DISCHARGE:                              CARDIAC CATHETERIZATION   REQUESTING PHYSICIAN:  Dr. Valera Castle   PRIMARY CARDIOLOGIST:  Dr. Orlean Patten [place]   PRIMARY CARE PHYSICIAN:  Dr. 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  He is  admitted to the hospital now with chest pain.  He has ruled out for  myocardial infarction and is referred to the cardiac catheterization  laboratory for evaluation of his coronary anatomy.  The potential risks and  benefits were explained in advance and informed consent was obtained.   PROCEDURES PERFORMED:  1.  Left heart catheterization  2.  Selective coronary angiography.  3.  Left ventriculography.   ACCESS AND EQUIPMENT:  Prior to beginning the procedure a urinary catheter  was placed at the patient's request and given prior problems with urination  and bladder distension when supine.  Following this the area about the right  femoral artery was anesthetized with 1% lidocaine and a 6-French sheath was  placed in the right femoral artery via the modified Seldinger technique.  Standard preformed 6-French JL4 and JR4 catheters were used for selective  coronary angiography and an angled pigtail catheter was used for left heart  catheterization, left  ventriculography.  The patient tolerated the procedure  well.  He did have transient bradycardia with coronary injections, but no  frank vasovagal reaction and did not require any specific medication  intervention.  We used a total of 85 mL of Omnipaque.  All catheter  exchanges were made over a wire and the patient tolerated the procedure well  from a hemodynamic perspective.   HEMODYNAMICS:  Aorta 135/97 mmHg.  Left ventricle 135/14 mmHg.   ANGIOGRAPHIC FINDINGS:  1.  The left main coronary artery is free of significant flow-limiting      coronary atherosclerosis and gives rise to the left anterior descending,      a ramus intermedius, and circumflex coronary arteries.  2.  The left anterior descending is a medium caliber vessel, although it      tapers from mid-to-distal segment.  There are two branching proximal and      mid diagonal branches and three septal perforators.  In the mid left      anterior descending is approximately 40% stenosis with an area of mild      bridging followed by the area of tapering that is fairly smooth.  There      are no severe flow-limiting stenoses  noted.  3.  The ramus intermedius is a relatively small caliber vessel and is free      of significant flow-limiting coronary atherosclerosis.  4.  The circumflex coronary artery is medium in caliber and gives rise to      two obtuse marginal branches.  There are minor luminal irregularities      noted but no flow-limiting stenoses are noted.  5.  The right coronary artery is a medium to large caliber dominant vessel      with large posterolateral system.  There is a branching right      ventricular marginal as well.  No significant flow-limiting coronary      sclerosis is noted.   Left ventriculography was performed in the RAO projection, reveals an  ejection fraction approximately 50-55% with no focal anterior inferior wall  motion abnormality and trace mitral regurgitation.   DIAGNOSIS:  1.  Mild  coronary atherosclerosis as outlined.  There is smooth tapering of      the mid to distal left anterior descending with approximately 40%      stenosis in an area of mild bridging but no clearly flow-limiting      stenoses are noted.  2.  Left ventricular ejection fraction approximately 50-55% with no focal      anterior or inferior wall motion abnormality, trace mitral      regurgitation, and a left renal end-diastolic pressure of 14 mmHg.   DISCUSSION:  I reviewed the results with the patient and his family.  At  this point I would anticipate medical therapy for his coronary  atherosclerosis and ongoing risk factor modification.      Jonelle Sidle, [name] [facility]  Electronically Signed     SGM/MEDQ  D:  01/16/2006  T:  01/16/2006  Job:  [id number]   cc:   [name], [name] [facility]  [address]  [place]  [name] [address]   E. Graceann Congress, [name]  [address]  [address]  [place]  [name] [address]

## 2010-12-28 NOTE — Telephone Encounter (Signed)
Noted. Thx.

## 2010-12-28 NOTE — Op Note (Signed)
[facility]  Patient:    Alex [name], Alex [name] Visit Number: [id number] MRN: [id number]          Service Type: DSU Location: DAY Attending Physician:  Bonnetta Barry Dictated by:   [name], [name] Proc. Date: 11/03/01 Admit Date:  11/03/2001   CC:         Valentino Hue. Magrinat, [name]  Tera Mater. Evlyn Kanner, [name]  Sonda Primes, [name] [facility]  Wynn Banker, [name]   Operative Report  PREOPERATIVE DIAGNOSIS:  Papillary thyroid carcinoma.  POSTOPERATIVE DIAGNOSIS:  Papillary thyroid carcinoma.  PROCEDURE:  Total thyroidectomy.  SURGEON:  [name], [name]  ASSISTANT:  Gita Kudo, [name]  ANESTHESIA:  General per Dr. Gladis Riffle.  ESTIMATED BLOOD LOSS:  Minimal.  PREPARATION:  Betadine.  COMPLICATIONS:  None.  INDICATIONS:  The patient is a 55 year old white male who presented to my practice in December 2002 with newly diagnosed papillary thyroid carcinoma. The patient had been undergoing workup and treatment for squamous cell carcinoma of the right orbit. Patient underwent CT scan of the head and neck which demonstrated a mass lesion in the left lobe of the thyroid. Ultrasound guided fine needle aspiration was performed. Cytology was consistent with papillary thyroid carcinoma. After completion of adjuvant treatment for squamous cell carcinoma of the orbit, the patient is brought to the operating room for thyroidectomy for treatment of papillary thyroid carcinoma.  DESCRIPTION OF PROCEDURE:  The procedure is done in OR #1 at the [facility]. The patient is brought to the operating room, placed in a supine position on the operating room table. Following administration of general anesthesia, the patient is prepped and draped in the usual strict aseptic fashion. After ascertaining that an adequate level of anesthesia had been obtained, the patient is positioned and then prepped and draped in the usual strict aseptic  fashion. After ascertaining that adequate level of anesthesia had been obtained, a standard Kocher incision is made with a #10 blade. Dissection is carried down through the subcutaneous tissues and platysma. Hemostasis is obtained with the electrocautery. Skin flaps are developed cephalad and caudad from the thyroid notch to the sternal notch. A _______ self-retaining retractor is placed for exposure. Dissection is begun by incising the strap muscles in the midline. This is extended from the thyroid notch through the sternal notch. Dissection is begun on the left side. Strap muscles are reflected laterally. Adventitial tissue between the thyroid and the strap muscles is bluntly dissected with a Pension scheme manager. Middle thyroid vein is divided between Ligaclips. Gland is mobilized. There is approximately a 3-cm nodule in the inferior pole of the left thyroid lobe. This likely represents the previously identified carcinoma. The gland is rolled further anteriorly. Venous tributaries are divided between small and medium Ligaclips. Superior pole is mobilized. Superior pole vessels are ligated in continuity with 2-0 silk ligatures and medium Ligaclips and divided. The gland is rolled further medially. Tributaries to the superior pole are divided between medium Ligaclips. The gland is rolled further anteriorly. Branches of the inferior thyroid artery are divided between small Ligaclips. Recurrent laryngeal nerve is identified and preserved. Parathyroid tissue is identified and preserved. The gland is mobilized by dividing the ligamentum Allyson Sabal. The gland is mobilized up and onto the anterior surface of the trachea. No significant pyramidal lobe exists. Good hemostasis is noted in the left neck. A dry pack is placed in the left neck.  Next, dissection is begun on the right side. Again, strap muscles  are reflected laterally. Middle thyroid vein is identified and dissected out. It is divided between  double medium Ligaclips. Venous tributaries are divided between small and medium Ligaclips. The gland is rolled anteriorly up and onto the anterior surface of the trachea. Superior pole vessels are mobilized and ligated in continuity with 2-0 silk ligatures and medium Ligaclips. Vessels are divided. Gland is rolled further anteriorly. Branches of the inferior thyroid artery are again divided between small Ligaclips. Care is taken to preserve parathyroid tissue as well as the recurrent laryngeal nerve. Ligamentum Allyson Sabal is transected with the electrocautery and the gland is rolled up and onto the anterior surface of the trachea where it is completely excised. A suture is used to mark the right superior pole of the thyroid gland. The gland is submitted fresh to pathology for review. Good hemostasis is noted in the neck. Both sides of the neck are irrigated copiously with warm saline. Both sides of the neck are explored digitally without finding of significant adenopathy or mass lesion. A piece of Surgicel is placed over the recurrent laryngeal nerve region on both sides. Strap muscles are reapproximated in the midline with interrupted 3-0 Vicryl sutures. Platysma is reapproximated with interrupted 3-0 Vicryl sutures. Skin edges are reapproximated with widely spaced stainless steel staples and interspaced 1/2-inch Steri-Strips and benzoin. Sterile gauze dressings are applied. The patient is awakened from anesthesia and brought to the recovery room in stable condition. The patient tolerated the procedure well. Dictated by:   [name], [name] Attending Physician:  Bonnetta Barry DD:  11/03/01 TD:  11/04/01 Job: [id number]

## 2010-12-28 NOTE — Op Note (Signed)
[name], LIFE              ACCOUNT NO.:  [id number]   MEDICAL RECORD NO.:  [id number]          PATIENT TYPE:  OUT   LOCATION:  XRAY                         FACILITY:  [name]   PHYSICIAN:  [name], MD      DATE OF BIRTH:  1955/08/17   DATE OF PROCEDURE:  03/29/2005  DATE OF DISCHARGE:  03/25/2005                                 OPERATIVE REPORT   PREOPERATIVE DIAGNOSES:  1.  Left inguinal hernia.  2.  Left hydrocele.   POSTOPERATIVE DIAGNOSES:  1.  Left inguinal hernia.  2.  Left hydrocele.   PROCEDURE:  Repair left inguinal hernia with Prolene mesh.   SURGEON:  [name], [name]   ASSISTANT:  [name], [name]   ANESTHESIA:  General.   ESTIMATED BLOOD LOSS:  Minimal.   PREPARATION:  Betadine.   COMPLICATIONS:  None.   INDICATIONS:  The patient is 55 year old white male well known to my  surgical practice.  The patient has developed left groin pain.  Left  inguinal hernia containing fatty tissue was noted on CT scan January 2006.  The patient also has a large left-sided hydrocele. He desires concurrent  repair.  He now comes to surgery for repair.   DESCRIPTION OF PROCEDURE:  The procedure was done in OR #6 at the [name].  The patient is brought to the operating room,  placed in supine position on the operating room table. Following  administration of general anesthesia, the patient was prepped and draped in  usual strict aseptic fashion.  After ascertaining that an adequate level of  anesthesia been obtained, a left inguinal incision was made with a #15  blade.  Dissection was carried down through subcutaneous tissues and  hemostasis obtained with the electrocautery. External oblique fascia was  incised in line with its fibers and extended through the external inguinal  ring.  There was some moderate scar tissue from the patient's previous  childhood procedure for undescended testis.  Fascial planes were developed.  Cord structures were encircled with a Penrose drain.  The floor of the  inguinal canal was dissected out.  There is a small direct inguinal hernia.  Cord was explored, and there is a  moderate lipoma of the cord.  This was  dissected away from the cord structures up to the level of the internal  inguinal ring.  It is then ligated with a 3-0 Vicryl suture ligature and  divided with the electrocautery and excised completely.  Next the patient  underwent hydrocele repair by Dr. Willow Ora.  This will be dictated  under separate operative report.   Next the floor of the inguinal canal was recreated with a sheet of Prolene  mesh.  Mesh was cut to the appropriate dimensions.  It was secured to the  pubic tubercle and along the inguinal ligament with a running 2-0 Novofil  suture.  Mesh was split to accommodate the cord structures.  Superior margin  of the mesh was secured to the transversalis and internal oblique fascia  with interrupted 2-0 Novofil sutures.  Tails of  the mesh were then  overlapped lateral to the cord structures and secured to the inguinal  ligament with interrupted 2-0 Novofil suture.  Local field block is placed  with Marcaine.  Cord structures were returned to the inguinal canal.  External oblique fascia is closed with interrupted 3-0 Vicryl sutures.  Subcutaneous tissues were closed with interrupted 3-0 Vicryl sutures.  Skin  was closed with running 4-0 Vicryl subcuticular suture.  Local field block  is placed in the skin as  well. Wound is washed and dried, and Benzoin and  Steri-Strips were applied.  Sterile dressings were applied.  The patient is  awakened from anesthesia and transported to the recovery room in stable  condition. The patient tolerated the procedure well.      [name], MD  Electronically Signed     TMG/MEDQ  D:  03/29/2005  T:  03/29/2005  Job:  [id number]   cc:   [name], [name]  [address]  [name]  [name]  [address]  Fax: [phone]   [name], [name] [facility]  [address]  [name]  [name] [address]

## 2010-12-28 NOTE — Discharge Summary (Signed)
NAME:  CHIEF, WALKUP                        ACCOUNT NO.:  [id number]   MEDICAL RECORD NO.:  [id number]                   PATIENT TYPE:  INP   LOCATION:  5727                                 FACILITY:  [facility]   PHYSICIAN:  Kinnie Scales. Annalee Genta, [name]            DATE OF BIRTH:  1956/04/26   DATE OF ADMISSION:  03/08/2003  DATE OF DISCHARGE:  03/10/2003                                 DISCHARGE SUMMARY   ADMISSION DIAGNOSES:  1. Right submandibular mass.  2. 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  3. History of papillary carcinoma of the thyroid gland, status post     thyroidectomy and I-131 ablation.   DISCHARGE DIAGNOSES:  1. Right submandibular mass.  2. 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  3. History of papillary carcinoma of the thyroid gland, status post     thyroidectomy and I-131 ablation.   PROCEDURE:  Right selective neck dissection involving zones I, II and III  with transcervical excision of the right submandibular gland performed on  March 08, 2003.   DISPOSITION:  The patient is discharged to home in stable condition in the  company of his family.   DISCHARGE MEDICATIONS:  1. Preoperative medications:     a. Aciphex 20 mg p.o. q.h.s.     b. Pravachol 10 mg p.o. q.h.s.     c. Micardis 20 mg p.o. daily.     d. Flomax p.r.n.     e. Synthroid 150 mcg p.o. daily.     f. Bextra one p.o. daily p.r.n.  2. Additional discharge medications:     a. Augmentin 500 mg p.o. twice daily for 10 days.     b. Lortab 5/500 one to two tablets every four to six hours as needed for        pain, dispense 30 without refills.   PAIN MANAGEMENT:  As above.   ACTIVITY:  Limited.  No lifting or straining.   DIET:  As tolerated.  Primarily soft and liquid.   WOUND CARE:  Half strength hydrogen peroxide and  Bacitracin ointment to the  right cervical incision on a twice daily basis.  The patient may shower on  March 12, 2003.   FOLLOW UP:  The patient is scheduled to follow up in my office on March 15, 2003, or sooner as warranted.  He will also follow up with his urologist,  Dr. Willow Ora on March 22, 2003.   HISTORY OF PRESENT ILLNESS:  Mr. Hartog is a 55 year old white male who was  referred to our practice for evaluation of right submandibular swelling,  initially seen by Dr. Janyth Pupa.  The patient underwent initial work-up  including CT scan which showed a cystic mass in the right submandibular  space, abutting the right submandibular gland.  The patient had a  significant prior history of adenosquamous cell carcinoma of the right nasal  lacrimal system necessitating Mohs surgery and postoperative radiation  therapy.  He also had recent history of papillary carcinoma of the thyroid  and underwent total thyroidectomy with I-131 ablation .  The patient has  been followed by Dr. Darnelle Catalan and an MRI scan of the orbit and neck was  obtained.  Intraorbital contents showed scar tissue formation but no  evidence of mass or tumor.  Neck examination showed no lymphadenopathy or  mass with the exception of a cystic appearing mass involving the right  submandibular space.  Given the patient's history and examination, fine  needle aspiration was performed in our office.  This was considered  nondiagnostic and given that finding, I recommended to undertake a right  submandibular gland excision including excision of the palpable mass.  Frozen section evaluation if positive for carcinoma, then a right elective  neck dissection.  The risks, benefits and possible complications of the  surgical procedures were discussed in detail with the patient and his wife  and they understood and concurred with our plan for surgery which is  scheduled for March 08, 2003.   HOSPITAL COURSE:  The patient was  admitted to the ENT service on March 08, 2003, under Dr. Thurmon Fair care and underwent transcervical excision of the  right submandibular gland and associated mass.  Frozen section analysis  showed squamous cell carcinoma within cystic mass and given this finding, I  undertook a right selective neck dissection involving zones I, II, and III  of the neck.  The surgical procedure was completed without complication or  difficulty and tissue was sent to pathology for permanent microscopic and  gross analysis.  The patient's wound was closed without difficulty and he  was extubated and transferred from the operating room to the recovery room  in stable condition.   The patient was then transferred to unit 5700 for postoperative care.  The  patient had a history of prostatic hypertrophy and difficulty with urination  after prior general anesthesia.  He continued to have significant difficulty  and a coude urinary catheter was placed on the first postoperative evening.   The patient was stable throughout his postoperative course.  His drain  output was monitored closely.  On the first postoperative day, the drainage  had fallen to approximately 25 mL per shift and it was opted that we would  observe the patient for an extra postoperative day.  He was discharged to  home in stable condition on postoperative day #2, March 10, 2003,.  There  were no postoperative problems or complications.  At Dr. Lenoria Chime  recommendation, the patient was discharged with a Foley catheter in place  and scheduled to follow up with Dr. Retta Diones as an outpatient for  additional evaluation and management of this problem.   The patient was discharged to home with the above discharge instructions and  medications and is scheduled to follow up with me as outlined above.  for  additional evaluation, review of his pathology and removal of his surgical staples.  The patient and his wife were comfortable with his  discharge  planning and will follow up with me as above.  Kinnie Scales. Annalee Genta, [name]    DLS/MEDQ  D:  16/05/9603  T:  03/29/2003  Job:  [id number]

## 2010-12-28 NOTE — Telephone Encounter (Signed)
FYI - pt was seen at the [facility] this am, dx with thrush and given diflucan.

## 2010-12-28 NOTE — Op Note (Signed)
NAME:  Alex [name], Alex [name]                        ACCOUNT NO.:  [id number]   MEDICAL RECORD NO.:  [id number]                   PATIENT TYPE:  OIB   LOCATION:  5727                                 FACILITY:  [facility]   PHYSICIAN:  Kinnie Scales. Annalee Genta, [name]            DATE OF BIRTH:  June 25, 1956   DATE OF PROCEDURE:  03/08/2003  DATE OF DISCHARGE:                                 OPERATIVE REPORT   PREOPERATIVE DIAGNOSES:  1. Right submandibular mass.  2. History of adenosquamous carcinoma of the right lacrimal system, status     post Mohs surgery and radiation therapy.  3. History of papillary carcinoma of the thyroid, status post total     thyroidectomy and I-131 ablation.   POSTOPERATIVE DIAGNOSES:  1. Right submandibular mass.  2. 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   SURGICAL PROCEDURE:  Right supraomohyoid neck dissection, zones 1, 2, 3.   ANESTHESIA:  General endotracheal.   SURGEON:  Kinnie Scales. Annalee Genta, [name]   ASSISTANT:  Alfonse Flavors, [name]   ESTIMATED BLOOD LOSS:  Approximately 100 mL.   The patient was transferred from the operating room to the recovery room in  stable condition.  There were no complications.   BRIEF HISTORY:  Mr. Featherly is a 55 year old white male who was referred for  evaluation of a right submandibular mass.  The patient had a complex past  medical history, which included adenosquamous cell carcinoma of the right  lacrimal system.  This was treated with primary Mohs surgery and  postoperative radiation therapy with good healing.  Therapy was completed in  2002.  The patient was subsequently found to have a thyroid mass, and a  thyroidectomy was performed for papillary carcinoma of the thyroid.  This  was performed by [name], [name], in March 2003, and the patient  underwent I-131 ablation of residual thyroid tissue.  He developed bilateral  parotitis after his radioactive iodine, which resolved spontaneously.  He  presented  to our office approximately two months prior to this admission  with a right submandibular mass.  He was seen by Veverly Fells. Arletha Grippe, [name],  and diagnosed with possible sialoadenitis and was monitored.  He was treated  with a course of antibiotics without change in the status of the mass, which  had been gradually enlarging over a two-month period.  MRI scans of the  orbit and neck were performed by Raymond Gurney C. Magrinat, [name], the patient's  medical oncologist.  This showed scar tissue and postsurgical changes in the  right periorbital region but no evidence of recurrent mass, and the neck  imaging showed a cystic-appearing mass in the right submandibular space.  This appeared to be adjacent to the submandibular gland.  No other  adenopathy, mass, or abnormality was noted.  The patient was status post  thyroidectomy.  Results were reviewed in detail with the patient and his  wife and given  his history, a fine needle aspiration of the mass was  performed.  This was considered nondiagnostic by the pathologist.  With  those findings I recommended that we undertake excisional biopsy of the  right submandibular mass under general anesthesia with possible neck  dissection based on pathologic result.  The risks, benefits, and possible  complications of each of these individual surgical procedures were discussed  in detail with the patient and his wife, who understand and concur with our  plan for surgery, which was scheduled for March 08, 2003.   SURGICAL PROCEDURE:  The patient was brought to the operating room on March 08, 2003, and placed in the supine position on the operating table and  general endotracheal anesthesia was established without difficulty, and the  patient was adequately anesthetized.  He was injected with 4 mL of 1%  lidocaine and 1:100,000 dilution of epinephrine and the skin and  subcutaneous tissue in the area overlying the planned surgical incision.  The patient was then  prepped and draped in a sterile fashion.  The surgical  procedure was begun by creating a 3 cm horizontally-oriented skin incision  in a pre-existing skin crease approximately 2 cm below the margin of the  mandible.  The skin, subcutaneous tissue, and platysma muscle were divided  with a #15 scalpel blade and a subplatysmal flap was elevated from inferior  to superior along the submandibular space.  The inferior margin of the  submandibular gland was identified.  The marginal mandibular nerve was  identified and preserved throughout its course.  The subplatysmal flap was  elevated superiorly and the fascia overlying the submandibular gland was  elevated.  Proceeding in a subfascial plane, the mass was palpable along the  inferior and deep margin of the mandible on the right-hand side.  The mass  was then excised.  It appeared to be cystic in nature, and it was removed  entirely and sent to pathology for frozen section evaluation.  Frozen  section evaluation revealed the cystic mass to be cystic squamous cell  carcinoma consistent with a metastatic lesion.  Given that history and  findings, we proceeded with a right selective neck dissection.  The incision  was extended to create a horizontally-oriented curvilinear incision  extending from the mastoid tip to the midline of the neck.  The skin,  subcutaneous tissue, and platysma muscle were divided.  Subplatysmal flaps  were elevated superiorly and inferiorly.  Dissection was carried out in zone  1 of the neck by performing a submandibular gland excision.  The fascial  dissection was extended superiorly to incorporate the perivascular lymph  nodes, and the common facial vein and artery were identified, divided, and  suture ligated , and the submandibular gland was reflected inferiorly.  This  allowed direct access to the deep aspect of the submandibular space, and the lingual nerve and hypoglossal nerves were identified and preserved   throughout their course.  The mylohyoid muscle was elevated anteriorly, and  the submandibular duct was divided and suture ligated.  The gland was  reflected inferiorly over the digastric muscle.  With the submandibular  portion of the dissection completed, the submental space was then explored  using Bovie electrocautery and blunt and sharp dissection.  A submandibular  dissection was carried out, removing fatty and lymphoid tissue in the  submandibular space and incorporating this along the anterior aspect of the  dissection.  The anterior border of the sternocleidomastoid muscle was then  identified and fibrofatty tissue  was elevated anteriorly and on the deep  surface of the sternocleidomastoid muscle, creating a selective neck  dissection.  The sternocleidomastoid muscle, jugular vein, and spinal  accessory nerve were identified throughout their course within the  dissection site and were preserved without trauma.  With the posterior  margin of dissection completed, the jugular vein was skeletonized of  surrounding fatty tissue, including lymph nodes.  Several small veins were  divided and suture ligated, as was the common facial vein at its junction  with the jugular vein.  The omohyoid muscle was then identified along the  posterior aspect of the dissection extending posterior to anterior and  attached to the hyoid bone.  Fascial tissue from the omohyoid was dissected  superiorly, and this constituted the inferior aspect of our dissection.  Zones 1, 2, and 3 were then thoroughly dissected from the surrounding deep  structures.  The hypoglossal nerve was identified throughout its course, as  was the ansa hypoglossi and superior laryngeal nerves.  These were preserved  as the fascial and lymphoid tissue was removed from the dissection specimen.  With the dissection completed, this was sent to pathology for gross and  microscopic evaluation.  A 10 mm Blake drain was placed at the base of  the  incision and sutured in position after exiting through a separate stab  incision in the posterior aspect of the neck.  This was sutured in place  with a 2-0 silk suture.  The wound approximation was then undertaken after  copious irrigation.  The wound approximation was then undertaken after  copious irrigation.  There was no evidence of active bleeding.  A drain was  placed at the depths of the wound and the wound was closed in multiple  layers consisting of reapproximation of the platysma muscle with a 3-0  Vicryl suture on a tapered needle in an interrupted fashion.  Deep  subcutaneous closure was achieved with the same stitch, and final skin  closure was achieved with surgical staples and the wound was dressed with  bacitracin ointment.  The patient was then awakened from his anesthetic.  He  was extubated and was transferred from the operating room to the recovery room in stable condition.  There were no complications.  Estimated blood  loss was approximately 100 mL.                                               Kinnie Scales. Annalee Genta, [name]    DLS/MEDQ  D:  04/54/0981  T:  03/08/2003  Job:  [id number]   cc:   Valentino Hue. Magrinat, [name]  [address] [facility]  [place]  [name] [address]  Fax: [phone]   Tera Mater. Evlyn Kanner, [name]  [address]  [place]  [name] [address]  Fax: [phone]   [name], [name] [facility]   [name], [name]  [address] [place]  [name] [address]  Fax: [phone]

## 2010-12-28 NOTE — H&P (Signed)
Alex [name], Alex [name]              ACCOUNT NO.:  [id number]   MEDICAL RECORD NO.:  [id number]          PATIENT TYPE:  INP   LOCATION:  1825                         FACILITY:  [facility]   PHYSICIAN:  Thomas C. Wall, [name]   DATE OF BIRTH:  Apr 04, 1956   DATE OF ADMISSION:  01/15/2006  DATE OF DISCHARGE:                                HISTORY & PHYSICAL   PRIMARY CARE PHYSICIAN:  Dr. [name].   GI:  Dr. Victorino Dike.   CARDIOLOGY:  Dr. Glennon Hamilton.   PATIENT PROFILE:  A 55 year old married white male, with no prior history of  CAD, who presents with chest pain.   PROBLEM LIST:  1.  Chest pain.      1.  October 17, 2004, Adenosine-Myoview, EF 66%, no evidence of ischemia.  2.  Hypertension.  3.  Hyperlipidemia.  4.  Squamous cell carcinoma of the right orbit with thyroid and neck      metastasis in July 2004 status post chemo and radiation.  5.  History of syncope and marked sinus bradycardia during right shoulder      surgery in 2006.  6.  Esophageal stricture status post dilatation by Dr. Victorino Dike.  7.  Status post right shoulder surgery 2006.  8.  Status post right retinal surgery in 2006 secondary to complications of      radiation therapy.  9.  Double inguinal hernia and hydrocele repair in 2006.  10. Repair of deviated septum 2006.   ALLERGIES:  1.  AUGMENTIN.  2.  PAXIL.  3.  INDERAL.   HISTORY OF PRESENT ILLNESS:  A 55 year old married white male, with no prior  history of CAD, who had a normal Adenosine-Myoview in March 2006.  He  reports a two-week history of fatigue and daily brief palpitations which did  not limit him in anyway.  He was otherwise in his usual state of health  until he woke up in the middle of the night with 8-9/10 substernal chest  pain and pressure radiating down his epigastric to his umbilicus without  associated symptoms.  He thought this was secondary to his esophageal  stricture and therefore he got up and took some Maalox and  followed that  with some cold milk.  He did not think that either really changed the pain  but said he was so tired he just fell back to sleep.  When he woke up this  morning, he felt well without any recurrent discomfort and then as he was  running some errands during the day and had some recurrent chest pain.  He  was convinced that this was secondary to esophageal stricture and presented  to Dr. Richarda Osmond office and saw him at about 3:30.  He did have some  pain just prior to seeing that resolved spontaneously.  Dr. Corinda Gubler was  concern that this was not secondary to GI causes and potentially was  secondary to cardiac causes and Dr. Gala Romney was contacted and he was  advised to come into the ED for further evaluation.  He is currently  painfree.  His ECU  is without acute changes and lab work is pending.   HOME MEDICATIONS:  1.  AcipHex 20 mg daily.  2.  Micardis 40 mg daily.  3.  Synthroid 175 mcg daily.  4.  Pravachol 20 mg daily.  5.  Cymbalta 30 mg daily.  6.  Lorazepam 1 mg q.h.s.  7.  AndroGel 1 mg b.i.d.  8.  Salagen 7.5 mg daily.   FAMILY HISTORY:  Mother died of sepsis at age 44.  Father is alive at age 49  with hypertension and prostate CA.  He has in all seven brothers and  sisters;  one died of lung cancer which was squamous cell in nature at age  72.   SOCIAL HISTORY:  Lives in [place] with his wife.  He is currently on  disability.  He was previously a Engineer, civil (consulting).  He has seven  children.  He does not use tobacco, alcohol or drugs and does not routinely  exercise.   REVIEW OF SYSTEMS:  Positive for recent development of a rash on his right  arm and across his chest.  Chest pain as of today.  He does have some BPH  symptoms and straining.  He has anxiety as well as GERD symptoms.   PHYSICAL EXAM:  VITAL SIGNS:  Temperature 98.4, heart rate 63, respirations  24, blood pressure 133/82, pulse ox 98% on room air.  Pleasant white male in  no acute  distress.  Awake, alert and oriented x3.  NECK:  Normal carotid upstroke.  No bruits or JVD.  LUNGS:  Respirations and unlabored.  Clear to auscultation.  CARDIAC:  Regular S1 and S2.  No S2, S3 or murmurs.  ABDOMEN:  Round, soft, nontender, nondistended.  Bowel sounds present x4.  EXTREMITIES:  Warm, dry, pink.  No clubbing, cyanosis or edema.  Dorsalis  pedis and posterior tibial pulses 2+ bilaterally.   Chest x-ray is pending.  EKG shows sinus rhythm with a normal axis.  No  acute ST-T changes.  Lab work pending.   ASSESSMENT/PLAN:  1.  Chest pain is somewhat atypical; however, he does have risk factors      including hypertension and hyperlipidemia.  He has had head and neck      radiation in 2004.  We will plan to admit and cycle cardiac markers.  We      will plan on cardiac catheterization in the a.m. to rule out evidence of      coronary artery disease in light of chest pain despite previous negative      functional study.  We will add aspirin and heparin.  Continue ARB and      statin.  No beta blockers secondary to history of bradycardia.  2.  Hypertension, stable.  3.  Hyperlipidemia:  Check lipids and LFTs.  4.  Esophageal stricture and gastroesophageal reflux disease:  Continue      proton pump inhibitor.  5.  Anxiety:  Continue home medications.      Ok Anis, NP      Jesse Sans. Wall, [name]  Electronically Signed    CRB/MEDQ  D:  01/15/2006  T:  01/15/2006  Job:  [id number]

## 2010-12-28 NOTE — Discharge Summary (Signed)
NAMEJAHMEER, PORCHE                          ACCOUNT NO.:  [id number]   MEDICAL RECORD NO.:  [id number]                   PATIENT TYPE:   LOCATION:                                       FACILITY:   PHYSICIAN:  Valentino Hue. Magrinat, [name]            DATE OF BIRTH:   DATE OF ADMISSION:  05/30/2003  DATE OF DISCHARGE:  06/10/2003                                 DISCHARGE SUMMARY   DISCHARGE DIAGNOSES:  1. 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  2. History of papillary thyroid carcinoma.  3. Renal calculi.  4. Hypertension.  5. Prostatitis.  6. Hypercholesterolemia.  7. Gastroesophageal reflux disease.  8. Esophageal stricture.  9. History of thyroidectomy.  10.      Status post right supraomohyoid neck dissection.  11.      Status post right knee arthropathy.  12.      History of repair of undescended right testicle and right     herniorrhaphy.  13.      History of pre-cancerous skin lesions.  14.      Anemia.  15.      Severe mucositis.  16.      Fever.  17.      Poorly-controlled diarrhea.   PROCEDURES:  1. Radiation treatment.  2. Intravenous antibiotics.  3. Intravenous analgesics.  4. Hydration.   HOSPITAL COURSE:  The patient was admitted on May 30, 2003 with severe  mucositis secondary to his chemo radiation.  The patient was unable to  tolerate anything by mouth.  He was having fever with rigors.  He was  hydrated.  Intravenous antibiotics were started after he was pancultured,  and his oral mucositis was treated, not only with intravenous analgesics,  but also with local analgesics, as well.  The patient continued to have  fevers for several days, although his cultures remain negative at the time  of this dictation.  The admission temperatures were greater than 102  degrees.  By June 02, 2003, the highest temperature recorded was 100.2.  On June 05, 2003 was the patient's last fever spike at 101 degrees.  Therefore, his temperature was  well controlled.  His medications included  Primaxin, and later vancomycin, as well.  He also received Diflucan and  acyclovir.   With intensive treatment of his pain, and control of his fever, the patient  was able to successfully complete his radiation treatment, namely a total of  26 out of 30 radiation treatments planned.   At the time of discharge, the patient's condition was significantly  improved.  He was afebrile.  His vital signs were again stable, and his  mucositis, while not completely resolved, was sufficiently improved that he  was able to take food and drink by mouth, so that he no longer needed to be  in the hospital.   DISCHARGE MEDICATIONS:  1. Aciphex 20 mg daily.  2. Pravachol 20 mg  daily.  3. Micardis 20 mg daily.  4. Synthroid 150 mcg daily.  5. Carafate 1-2 tablespoons four times a day until the bottle is finished.  6. ____________ 7.5 mg up to four times a day as needed.  7. Diflucan 100 mg daily for five days.  8. Acyclovir 400 mg twice daily for five days.  9. Keflex 500 mg daily for five days.  10.      Lorazepam 1 mg at bedtime as needed.  11.      Zofran 8 mg as needed for nausea.  12.      Metoclopramide 10 mg before meals as needed for nausea.  13.      Morphine 15 mg one tablet up to four times a day as needed for     pain.  14.      Hydrocodone APAP 1-2 tablets as needed when the pain no longer     requires morphine.  15.      Pain management is as above, and in addition, the patient may also     take Aleve or Tylenol.  16.      Senokot-S twice daily.  17.      ____________ two tablespoons as needed.   ACTIVITY:  As tolerated.   DIET:  Unrestricted.   WOUND CARE:  Not applicable.   SPECIAL INSTRUCTIONS:  He was instructed to have a bowel movement at least  every 48 hours.   FOLLOWUP:  He will see me in the office on June 24, 2003, and he is call  to confirm that appointment.                                               Valentino Hue.  Magrinat, [name]    Ronna Polio  D:  06/22/2003  T:  06/23/2003  Job:  [id number]

## 2010-12-28 NOTE — Op Note (Signed)
Alex [name], FUHS              ACCOUNT NO.:  [id number]   MEDICAL RECORD NO.:  [id number]          PATIENT TYPE:  OIB   LOCATION:  1412                         FACILITY:  [facility]   PHYSICIAN:  [name], M.D.DATE OF BIRTH:  November 30, 1955   DATE OF PROCEDURE:  03/29/2005  DATE OF DISCHARGE:                                 OPERATIVE REPORT   PREOPERATIVE DIAGNOSIS:  Left hydrocele.   POSTOPERATIVE DIAGNOSIS:  Left hydrocele.   PRINCIPAL PROCEDURE:  Left hydrocelectomy.   SURGEON:  [name], [name]   FIRST ASSISTANT:  [name], [name]   ANESTHESIA:  General.   COMPLICATIONS:  None.   BRIEF HISTORY:  A 55 year old male who has had intermittent left testicular  pain through the years. He has had a hydrocele which has been minimally  symptomatic. He has a symptomatic left hernia, and the hydrocele has become  more bothersome to the patient.   He was recently seen by Dr. Gerrit Friends. It was recommended, that with the  patient's left testicular and left inguinal pain, the presence of both the  hydrocele and hernia on that side, that he undergo surgery to have these  repaired. He is aware of the risks and complications of the procedures,  desires to proceed.   DESCRIPTION OF PROCEDURE:  The patient was administered preoperative IV  antibiotics. He was taken to the operating room where general anesthetic was  administered. Dr. Gerrit Friends commenced with the hernia repair. His inguinal  incision was made and the hernia was isolated. Following preparation of the  hernia repair, the hydrocelectomy was then performed. The skeletonized left  spermatic cord was retracted, and the hydrocele delivered from the patient's  left scrotum. The gubernaculum was incised with electrocautery. There was a  moderate hydrocele, which was then incised anteriorly. Approximately 2  ounces of clear straw-colored fluid was removed. There were seven or eight  smaller cystic structures associated  with the hydrocele and epididymis.  These were all opened and cauterized. The hydrocele sac was then imbricated  posterior to the testicle using a running 3-0 Vicryl. The cord was then  blocked with 5 mL of 0.25% plain Marcaine. The left testicle was then  returned to the left  hemiscrotum. Dr. Gerrit Friends then proceeded with placement of the mesh. Both the  inguinal dissection and the mesh repair will be dictated in a separate note  by Dr. Gerrit Friends. Dr. Gerrit Friends then closed the skin.   The patient tolerated the procedure well. He was awakened, extubated, and  taken to the PACU in stable condition.      [name], [name]  Electronically Signed     SMD/MEDQ  D:  03/29/2005  T:  03/29/2005  Job:  [id number]

## 2010-12-28 NOTE — Assessment & Plan Note (Signed)
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 Victorino Dike, recent cardioangiography to rule out  coronary source of either nonobstructive disease or __________ lesion of the  LAD.  The patient is under significant stress at home.  He has a sore  throat, some atypical chest pain, palpitations.  Blood pressure this morning  was elevated, and the patient comes in now for further evaluation.   The patient is on:  1.  Aciphex.  2.  Synthroid 175 mcg daily.  3.  Lamictal 50 mg daily.  4.  Cymbalta 30 mg.  5.  AndroGel.  6.  Salagen.  7.  Diflucan.  8.  Aspirin 81 mg.  9.  Micardis 80 mg.  10. Vytorin 10/40 mg.   PHYSICAL EXAMINATION:  Blood pressure 150/105, pulse 88, normal sinus  rhythm.  GENERAL APPEARANCE:  Stable.  JVP is elevated, carotid pulses palpable and equal without bruits.  LUNGS:  Clear.  CARDIAC:  Exam is normal.  EXTREMITIES:  Normal.   IMPRESSION:  Diagnoses as above.  Specifically today the blood pressure is  poorly controlled.   I suggested adding Norvasc 10 mg.   I would consider beta blocker because of the palpitations, however, he has  history of symptomatic bradycardia.  The patient is to take his blood  pressure daily, report to [place] in a week. I will see him back in 6 weeks or  p.r.n.                             E. Graceann Congress, MD, [facility]    EJL/MedQ  DD:  02/25/2006  DT:  02/26/2006  Job #:  [id number]

## 2010-12-28 NOTE — Discharge Summary (Signed)
Alex [name], Alex [name]              ACCOUNT NO.:  [id number]   MEDICAL RECORD NO.:  [id number]          PATIENT TYPE:  INP   LOCATION:  2016                         FACILITY:  [facility]   PHYSICIAN:  Jonelle Sidle, [name] LHCDATE OF BIRTH:  06/18/1956   DATE OF ADMISSION:  01/15/2006  DATE OF DISCHARGE:  01/16/2006                           DISCHARGE SUMMARY - REFERRING   DISCHARGE DIAGNOSIS:  1.  Noncardiac chest discomfort.  2.  Nonobstructive coronary artery disease.  3.  Hyperlipidemia.  4.  History as noted below.   PROCEDURE PERFORMED:  January 16, 2006, cardiac catheterization by Dr.  Diona Browner.   SUMMARY OF HISTORY:  Mr. Stults is a 55 year old white male who presents  with a two-week history of fatigue, brief, palpitations and, on the evening  admission, he was awakened in the middle of the night with substernal chest  pressure radiating to the epigastric region without any other associated  symptoms.  He attributed this to his esophageal stricture and took some  Maalox and some cold milk.  This did not affect the discomfort.  The  following day while running some errands he had reoccurring chest  discomfort, presented Dr. Richarda Osmond office.  He was seen at  approximately 3:30.  He was admitted for further evaluation of his chest  discomfort.   MEDICAL HISTORY:  1.  Negative adenosine Myoview on [id number].  2.  Hypertension.  3.  Hyperlipidemia.  4.  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  5.  Syncope associated with bradycardia during right shoulder surgery in      2006.  6.  Esophageal stricture post dilatation.  7.  Right shoulder surgery.  8.  Right retinal surgery in 2006.  9.  Double inguinal hernia and hydrocele repair.  10. Deviated septum repair.   ALLERGIES:  1.  AUGMENTIN.  2.  PAXIL.  3.  INDERAL.   LABORATORY:  Admission weight was 87.2 kg. H&H 16 and 49, normal indices,  platelets 229, WBCs 14.  PTT 25, PT 13.2.  Sodium 130, potassium 4.1, BUN  20, creatinine 1.1, normal LFTs.  Albumin was slightly low at 3.4.  Calcium  was low at 7.9, magnesium was 2.2.  CK, MBs and troponins were negative x2.  Fasting lipids on January 16, 2006, showed total cholesterol 217, triglycerides  46, HDL 66, LDL was elevated at 142.   Chest x-ray on admission showed no acute disease.   EKG showed normal sinus rhythm, left axis deviation, early R-wave, early  repolarization.   HOSPITAL COURSE:  The patient was placed on IV heparin and his home  medications.  Overnight, he did not have any further discomfort. Enzymes and  EKGs ruled out myocardial infarction.  On January 16, 2006, Dr. Diona Browner  performed cardiac catheterization.  He had a mid 40% LAD with some bridging  with a distal taper.  No obstructive lesions were found.  EF was 50-55% with  trace MR without wall motion abnormalities.  During the coronary injections  he had some transient bradycardia,  but no frank vagal response.  Findings  were discussed with the patient and family and Dr. Diona Browner anticipated  medical therapy and risk factor modification.  Post sheath removal and  bedrest, he was ambulating the halls without difficulty.  Foley was  discontinued and his catheterization site was intact.  It was felt that he  could be discharged home.   DISPOSITION:  He is asked to maintain low salt, fat, cholesterol diet.  His  activity and wound care is per the supplemental discharge sheet  postcatheterization.  He was asked to begin aspirin 325 mg daily, continue  on his home medications which include AcipHex 20 mg daily, Micardis 40 mg  daily, Synthroid 175 mcg daily, Pravachol 20 mg daily, Cymbalta 30 mg daily,  lorazepam 1 mg nightly, AndroGel 1 mg b.i.d. and Salagen 7.5 daily.  He will  follow up with Dr. Glennon Hamilton on January 28, 2006, at 2:30. Consideration at  the time of follow-up will be to begin a Statin.  He was  also asked to  follow up with his primary care physician Dr. Posey Rea and Dr. Victorino Dike  as needed.   DISCHARGE TIME:  Less than 30 minutes.      Joellyn Rued, P.A. [facility]      Jonelle Sidle, [name] [facility]  Electronically Signed    EW/MEDQ  D:  01/16/2006  T:  01/16/2006  Job:  [id number]   cc:   Cecil Cranker, [name]  [address]  [address]  [place]  [name] [address]   Ulyess Mort, [name] [facility]  [address]  [place]  [name] [address]   [name], [name] [facility]  [address]  [place]  [name] [address]

## 2010-12-28 NOTE — H&P (Signed)
Alex [name], Alex [name]                        ACCOUNT NO.:  [id number]   MEDICAL RECORD NO.:  [id number]                   PATIENT TYPE:  INP   LOCATION:  0283                                 FACILITY:  [facility]   PHYSICIAN:  Valentino Hue. Magrinat, [name]            DATE OF BIRTH:  [birth year]   DATE OF ADMISSION:  05/30/2003  DATE OF DISCHARGE:                                HISTORY & PHYSICAL   SUBJECTIVE/HISTORY OF PRESENT ILLNESS:  [name] is a pleasant, 55-year-  old, [place] gentleman with recurrent squamous cell carcinoma of the head  and neck, but undergoing active radiation therapy with Erbitux.  He has had  significant mucositis issues, and has had recent significant radiation  changes in the right anterolateral neck region.  Following radiation therapy  this a.m., he presented to [facility] ED complaining of fevers.  Workup  including chest x-ray, CBC, urinalysis, and blood cultures were all negative  to date.  The patient is also noted to have a CMET which was within normal  limits.  [name] states that he began feeling quite poorly on May 26, 2003, p.m., he has had decreased p.o. intake primarily of just liquids  and milk-based products secondary to significant mucositis.  He denies any  shortness of breath, cough, or unusual chest pain not associated with the  radiation-induced mucositis.  He does note some diarrhea stools for  approximately one week.   PAST MEDICAL HISTORY:  1. 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  2. History of papillary thyroid carcinoma.  3. Renal calculi.  4. Hypertension.  5. Prostatitis.  6. Hypercholesterolemia.  7. GERD.  8. Esophageal stricture.   PAST SURGICAL HISTORY:  1. S/P right supraomohyoid neck dissection, July 2004.  2. Thyroidectomy, July 2003.  3. Mastectomy.  4. Right knee arthroplasty.  5. History of repair of undescended right testicle and right herniorrhaphy,     age 49.  6. History of  precancerous skin lesions removed in the past.   ALLERGIES:  No known drug allergies.   MEDICATIONS AT TIME OF ADMISSION:  Patient admits not taking any oral pills  over the last couple of days, and he has been on Pravachol, Micardis 40 mg  p.o. daily, Aciphex, Synthroid 175 mcg p.o. daily.   FAMILY MEDICAL HISTORY:  Noncontributory.   SOCIAL HISTORY:  [name] and his wife, Alex [name], reside in [place],  [place].  He works in the Medical laboratory scientific officer.  They have seven  children, ages ranging 20 to 4-1/2.  He is a member of the [employer].  He denies any tobacco use or alcohol consumption.   REVIEW OF SYSTEMS:  As per HPI, otherwise noncontributory.   OBJECTIVE PHYSICAL EXAM:  VITAL SIGNS:  Temperature 102.1, blood pressure  123/87, pulse rate 122.  GENERAL:  A chronically ill-appearing, middle-aged, white male, who appears  uncomfortable with significant radiation change but in no  acute distress.  HEENT:  Obvious oral mucositis radiation change present in the right neck  and oropharyngeal region.  Full exam could not be performed due to  tenderness in the area.  LUNGS:  Clear to auscultation.  HEART:  Tachycardic rate, but normal rhythm.  ABDOMEN:  Nontender.  Normal bowel sounds.  EXTREMITIES:  The right forearm Redi-Port is benign in access.  Lower  extremities benign.   LABORATORY DATA:  CBC and CMET from ED is not available at this time from  May 26, 2003.  Hemoglobin 13.3 g, platelet count 217,000, WBC 6700 with  an ANC of 4000.   IMPRESSION/PLAN:  1. Fever.  Admit to [facility] for intravenous antibiotic therapy.  2. Significant radiation-induced oral mucositis leading to dehydration,     admit to [facility] for intravenous fluid support, and vigorous     analgesics.   The case has been reviewed with Dr. Darnelle Catalan who also examined patient, we  will obtain the above including starting him on normal saline for IV fluid   support.  Diet will be clear liquid to full liquids as tolerated, utilizing  Viscus Lidocaine, Carafate, and Reglan.  He will also be started on morphine  2 to 6 mg IV q.1h. for pain control.  CBC and CMET will be monitored  closely.  He will be started on Primaxin 500 mg q.6h. IV with change of  antibiotic coverage if indicated.     Amada Kingfisher, P.A.                Valentino Hue. Magrinat, [name]    CTS/MEDQ  D:  05/30/2003  T:  05/30/2003  Job:  [id number]   cc:   Wynn Banker, [name]  [address] [facility]  [place]  [name]  [address]  Fax: [phone]   [name], [name] [facility]

## 2010-12-28 NOTE — Assessment & Plan Note (Signed)
[facility]                              CARDIOLOGY OFFICE NOTE   CHRISEAN, KLOTH                     MRN:          [id number]  DATE:06/03/2006                            DOB:          01/17/1956    Mr. Luckey is a very pleasant but unfortunate 55 year old white male with  hypertension, hyperlipidemia, squamous cell cancer of right arm, neck,  metastasis status post chemotherapy and radiation. History of sinus  bradycardia during right shoulder surgery. He also has a history of  esophageal stricture. More recently he has been found to have immune  deficiency and diagnosed with CVID (common variable immune deficiency) per  [facility].  This is thought to be related to his  chemotherapy and radiation. He comes in today because of palpitations  throughout the day. These sound like PACs or PVCs. He has had no sustained  arrhythmia.   MEDICATIONS:  Aciphex 20, Synthroid 175, Lamictal 50 b.i.d., AndroGel,  aspirin 81, Micardis 80, Vytorin 10/40.   HABITS:  The patient states he drinks 1-1/2 cups of coffee daily.   PHYSICAL EXAMINATION:  VITAL SIGNS:  Blood pressure 136/82, pulse 76, no  arrhythmia.  NECK:  JVP is not elevated. Carotid pulses palpable without bruits.  LUNGS:  Clear.  CARDIAC:  Exam unremarkable.  EXTREMITIES:  Revealed no edema.   EKG: Normal sinus rhythm, incomplete right bundle branch block.   IMPRESSION:  Diagnosis above. He has palpitations probably related to PVCs.   I suggested a BNP, TSH and if he continues to have symptoms will do an even  monitor.   I have also suggested he discontinue his caffeine.    ______________________________  E. Graceann Congress, MD, [facility]    EJL/MedQ  DD: 06/03/2006  DT: 06/04/2006  Job #: [id number]

## 2010-12-31 ENCOUNTER — Encounter (HOSPITAL_COMMUNITY): Payer: BC Managed Care – PPO | Admitting: Licensed Clinical Social Worker

## 2011-01-05 ENCOUNTER — Ambulatory Visit (INDEPENDENT_AMBULATORY_CARE_PROVIDER_SITE_OTHER): Payer: BC Managed Care – PPO | Admitting: Family Medicine

## 2011-01-05 ENCOUNTER — Encounter: Payer: Self-pay | Admitting: Family Medicine

## 2011-01-05 VITALS — BP 118/88 | HR 84 | Temp 97.9°F | Resp 16 | Ht 73.0 in | Wt 177.0 lb

## 2011-01-05 DIAGNOSIS — B370 Candidal stomatitis: Secondary | ICD-10-CM

## 2011-01-05 MED ORDER — NYSTATIN 100000 UNIT/ML MT SUSP: 500000.0000 [IU] | Freq: Four times a day (QID) | OROMUCOSAL | Status: AC

## 2011-01-05 MED ORDER — FLUCONAZOLE 100 MG PO TABS: 100.0000 mg | ORAL_TABLET | Freq: Every day | ORAL | Status: AC

## 2011-01-05 NOTE — Progress Notes (Signed)
Recent oral thrush not resolved with diflucan for 7 days.  H/o immune def, IgG def.  Tongue is irritated. Throat is sore.  No FCNAV.   Pain with swallowing.    Meds, vitals, and allergies reviewed.   ROS: See HPI.  Otherwise, noncontributory.  Nad, nontoxic.  ncat Tm wnl Nasal exam w/o erythema OP with erythema and scant white thrush Neck supple rrr ctab

## 2011-01-05 NOTE — Assessment & Plan Note (Signed)
Restart diflucan and add on nystatin.  Fu with PMD early next week.

## 2011-01-05 NOTE — Patient Instructions (Signed)
Start back on the diflucan and the nystatin.  Call about seeing Dr. Posey Rea early next week.

## 2011-01-08 ENCOUNTER — Telehealth: Payer: Self-pay | Admitting: Internal Medicine

## 2011-01-14 ENCOUNTER — Encounter (HOSPITAL_COMMUNITY): Payer: BC Managed Care – PPO | Admitting: Psychiatry

## 2011-01-14 DIAGNOSIS — F319 Bipolar disorder, unspecified: Secondary | ICD-10-CM

## 2011-01-14 NOTE — Telephone Encounter (Signed)
Done

## 2011-01-15 ENCOUNTER — Encounter (HOSPITAL_COMMUNITY): Payer: BC Managed Care – PPO | Admitting: Licensed Clinical Social Worker

## 2011-01-15 DIAGNOSIS — F319 Bipolar disorder, unspecified: Secondary | ICD-10-CM

## 2011-01-21 ENCOUNTER — Other Ambulatory Visit: Payer: Self-pay | Admitting: *Deleted

## 2011-01-21 MED ORDER — PRAVASTATIN SODIUM 40 MG PO TABS: 40.0000 mg | ORAL_TABLET | Freq: Every day | ORAL | Status: DC

## 2011-01-30 ENCOUNTER — Encounter (HOSPITAL_COMMUNITY): Payer: BC Managed Care – PPO | Admitting: Licensed Clinical Social Worker

## 2011-01-30 DIAGNOSIS — F319 Bipolar disorder, unspecified: Secondary | ICD-10-CM

## 2011-02-18 ENCOUNTER — Encounter (HOSPITAL_COMMUNITY): Payer: BC Managed Care – PPO | Admitting: Licensed Clinical Social Worker

## 2011-02-18 DIAGNOSIS — F319 Bipolar disorder, unspecified: Secondary | ICD-10-CM

## 2011-02-25 ENCOUNTER — Encounter (HOSPITAL_COMMUNITY): Payer: BC Managed Care – PPO | Admitting: Licensed Clinical Social Worker

## 2011-03-04 ENCOUNTER — Encounter (HOSPITAL_COMMUNITY): Payer: BC Managed Care – PPO | Admitting: Licensed Clinical Social Worker

## 2011-03-13 ENCOUNTER — Encounter (HOSPITAL_COMMUNITY): Payer: BC Managed Care – PPO | Admitting: Psychiatry

## 2011-03-13 DIAGNOSIS — F319 Bipolar disorder, unspecified: Secondary | ICD-10-CM

## 2011-03-18 ENCOUNTER — Encounter (HOSPITAL_COMMUNITY): Payer: BC Managed Care – PPO | Admitting: Licensed Clinical Social Worker

## 2011-03-21 ENCOUNTER — Encounter (HOSPITAL_COMMUNITY): Payer: BC Managed Care – PPO | Admitting: Licensed Clinical Social Worker

## 2011-03-21 DIAGNOSIS — F319 Bipolar disorder, unspecified: Secondary | ICD-10-CM

## 2011-04-08 ENCOUNTER — Encounter (HOSPITAL_COMMUNITY): Payer: BC Managed Care – PPO | Admitting: Licensed Clinical Social Worker

## 2011-04-08 DIAGNOSIS — F319 Bipolar disorder, unspecified: Secondary | ICD-10-CM

## 2011-04-09 ENCOUNTER — Other Ambulatory Visit: Payer: Self-pay | Admitting: *Deleted

## 2011-04-09 ENCOUNTER — Telehealth: Payer: Self-pay | Admitting: Physician Assistant

## 2011-04-09 ENCOUNTER — Other Ambulatory Visit: Payer: Self-pay | Admitting: Internal Medicine

## 2011-04-09 MED ORDER — SULFAMETHOXAZOLE-TRIMETHOPRIM 800-160 MG PO TABS: 1.0000 | ORAL_TABLET | Freq: Two times a day (BID) | ORAL | Status: AC

## 2011-04-09 NOTE — Telephone Encounter (Signed)
Pt of Dr. Prescott Gum with various medical problems including hx of immunodeficiency, thyroid CA, squamous cell CA, nonobstructive CAD called tonight regarding symptoms he's had infrequently over the last few days. He has noted occasional palpitations, "like a flipping sensation" as well as just a general sense of fatigue for several days. BP/HR have been stable. He sounded anxious on the phone, recounting the things he thought his PCP might be interested in checking which were all very reasonable (CBC, TSH, testosterone level given his recent change in replacement, etc) but was looking for advice for the occasional palpitations. No CP/SOB. I told him I thought he might benefit from an OV +/- event monitor to help [place] figure out where his symptoms were coming from. He does not currently feel poorly. I called the office VM and left a message for our office to call him tomorrow to schedule possible appt. Pt also plans on calling his PCP tomorrow to discuss this as well. He stated he was aware to go to ER if symptoms worsen or if he develops any other symptoms concerning to him.

## 2011-04-09 NOTE — Telephone Encounter (Signed)
Requested Medications     sulfamethoxazole-trimethoprim (BACTRIM DS) 800-160 MG per tablet [Pharmacy Med Name: SULFAMETH/TMP DS TAB]   TAKE 1 TABLET BY MOUTH TWICE A DAY   Disp: 28 tablet R: 0 Start: 04/09/2011  Class: Normal   Last refill: 07/02/2010

## 2011-04-09 NOTE — Telephone Encounter (Signed)
OK to fill this prescription with additional refills x0 Thank you!  

## 2011-04-22 ENCOUNTER — Encounter (HOSPITAL_COMMUNITY): Payer: BC Managed Care – PPO | Admitting: Licensed Clinical Social Worker

## 2011-05-03 LAB — I-STAT 8, (EC8 V) (CONVERTED LAB)
Acid-Base Excess: 2
BUN: 13
Bicarbonate: 27.7 — ABNORMAL HIGH
Chloride: 103
Glucose, Bld: 92
HCT: 52
Hemoglobin: 17.7 — ABNORMAL HIGH
Operator id: 239701
Potassium: 4.3
Sodium: 134 — ABNORMAL LOW
TCO2: 29
pCO2, Ven: 44.3 — ABNORMAL LOW
pH, Ven: 7.404 — ABNORMAL HIGH

## 2011-05-03 LAB — POCT I-STAT CREATININE
Creatinine, Ser: 1.2
Operator id: 239701

## 2011-05-06 ENCOUNTER — Encounter (HOSPITAL_COMMUNITY): Payer: BC Managed Care – PPO | Admitting: Licensed Clinical Social Worker

## 2011-05-06 DIAGNOSIS — F319 Bipolar disorder, unspecified: Secondary | ICD-10-CM

## 2011-05-10 ENCOUNTER — Telehealth: Payer: Self-pay | Admitting: *Deleted

## 2011-05-10 ENCOUNTER — Other Ambulatory Visit (INDEPENDENT_AMBULATORY_CARE_PROVIDER_SITE_OTHER): Payer: BC Managed Care – PPO

## 2011-05-10 ENCOUNTER — Encounter (HOSPITAL_COMMUNITY): Payer: BC Managed Care – PPO | Admitting: Psychiatry

## 2011-05-10 DIAGNOSIS — Z79899 Other long term (current) drug therapy: Secondary | ICD-10-CM

## 2011-05-10 DIAGNOSIS — F319 Bipolar disorder, unspecified: Secondary | ICD-10-CM

## 2011-05-10 LAB — CBC WITH DIFFERENTIAL/PLATELET
Basophils Absolute: 0.1 10*3/uL (ref 0.0–0.1)
Basophils Relative: 0.5 % (ref 0.0–3.0)
Eosinophils Absolute: 0.1 10*3/uL (ref 0.0–0.7)
Eosinophils Relative: 1.2 % (ref 0.0–5.0)
HCT: 48.9 % (ref 39.0–52.0)
Hemoglobin: 16.1 g/dL (ref 13.0–17.0)
Lymphocytes Relative: 20.7 % (ref 12.0–46.0)
Lymphs Abs: 2.1 10*3/uL (ref 0.7–4.0)
MCHC: 32.9 g/dL (ref 30.0–36.0)
MCV: 84.9 fl (ref 78.0–100.0)
Monocytes Absolute: 1.1 10*3/uL — ABNORMAL HIGH (ref 0.1–1.0)
Monocytes Relative: 10.8 % (ref 3.0–12.0)
Neutro Abs: 6.7 10*3/uL (ref 1.4–7.7)
Neutrophils Relative %: 66.8 % (ref 43.0–77.0)
Platelets: 320 10*3/uL (ref 150.0–400.0)
RBC: 5.76 Mil/uL (ref 4.22–5.81)
RDW: 15.4 % — ABNORMAL HIGH (ref 11.5–14.6)
WBC: 10 10*3/uL (ref 4.5–10.5)

## 2011-05-10 LAB — COMPREHENSIVE METABOLIC PANEL
ALT: 24 U/L (ref 0–53)
AST: 21 U/L (ref 0–37)
Albumin: 4.3 g/dL (ref 3.5–5.2)
Alkaline Phosphatase: 67 U/L (ref 39–117)
BUN: 15 mg/dL (ref 6–23)
CO2: 28 mEq/L (ref 19–32)
Calcium: 8.8 mg/dL (ref 8.4–10.5)
Chloride: 103 mEq/L (ref 96–112)
Creatinine, Ser: 1 mg/dL (ref 0.4–1.5)
GFR: 78.99 mL/min (ref >60.00)
Glucose, Bld: 74 mg/dL (ref 70–99)
Potassium: 4.2 mEq/L (ref 3.5–5.1)
Sodium: 139 mEq/L (ref 135–145)
Total Bilirubin: 0.7 mg/dL (ref 0.3–1.2)
Total Protein: 7.3 g/dL (ref 6.0–8.3)

## 2011-05-10 LAB — TSH: TSH: 2.99 u[IU]/mL (ref 0.35–5.50)

## 2011-05-10 NOTE — Telephone Encounter (Signed)
Labs entered from written order pt brought in by Dr Lolly Mustache, Need to Fax results to [phone] when complete.

## 2011-05-13 ENCOUNTER — Encounter (HOSPITAL_COMMUNITY): Payer: BC Managed Care – PPO | Admitting: Psychiatry

## 2011-05-20 ENCOUNTER — Encounter (HOSPITAL_COMMUNITY): Payer: BC Managed Care – PPO | Admitting: Licensed Clinical Social Worker

## 2011-05-20 DIAGNOSIS — F319 Bipolar disorder, unspecified: Secondary | ICD-10-CM

## 2011-05-23 LAB — BASIC METABOLIC PANEL
BUN: 16
CO2: 28
Calcium: 8.9
Chloride: 101
Creatinine, Ser: 1.09
GFR calc Af Amer: >60
GFR calc non Af Amer: >60
Glucose, Bld: 138 — ABNORMAL HIGH
Potassium: 4.2
Sodium: 138

## 2011-05-23 LAB — POCT HEMOGLOBIN-HEMACUE
Hemoglobin: 15.4
Operator id: 116011

## 2011-05-30 LAB — COMPREHENSIVE METABOLIC PANEL
ALT: 30
AST: 26
Albumin: 4
Alkaline Phosphatase: 49
BUN: 13
CO2: 28
Calcium: 8.1 — ABNORMAL LOW
Chloride: 102
Creatinine, Ser: 1.14
GFR calc Af Amer: >60
GFR calc non Af Amer: >60
Glucose, Bld: 106 — ABNORMAL HIGH
Potassium: 4.1
Sodium: 136
Total Bilirubin: 0.9
Total Protein: 6.5

## 2011-05-30 LAB — CBC
HCT: 48.2
Hemoglobin: 15.8
MCHC: 32.8
MCV: 82.6
Platelets: 313
RBC: 5.84 — ABNORMAL HIGH
RDW: 15.5 — ABNORMAL HIGH
WBC: 18.1 — ABNORMAL HIGH

## 2011-05-30 LAB — DIFFERENTIAL
Basophils Absolute: 0
Basophils Relative: 0
Eosinophils Absolute: 0.1
Eosinophils Relative: 1
Lymphocytes Relative: 4 — ABNORMAL LOW
Lymphs Abs: 0.8
Monocytes Absolute: 0.9 — ABNORMAL HIGH
Monocytes Relative: 5
Neutro Abs: 16.3 — ABNORMAL HIGH
Neutrophils Relative %: 90 — ABNORMAL HIGH

## 2011-06-03 ENCOUNTER — Encounter (HOSPITAL_COMMUNITY): Payer: BC Managed Care – PPO | Admitting: Licensed Clinical Social Worker

## 2011-06-03 DIAGNOSIS — F319 Bipolar disorder, unspecified: Secondary | ICD-10-CM

## 2011-06-17 ENCOUNTER — Ambulatory Visit (INDEPENDENT_AMBULATORY_CARE_PROVIDER_SITE_OTHER): Payer: BC Managed Care – PPO | Admitting: Licensed Clinical Social Worker

## 2011-06-17 ENCOUNTER — Encounter (HOSPITAL_COMMUNITY): Payer: Self-pay | Admitting: Licensed Clinical Social Worker

## 2011-06-17 DIAGNOSIS — F319 Bipolar disorder, unspecified: Secondary | ICD-10-CM

## 2011-06-17 NOTE — Progress Notes (Signed)
   THERAPIST PROGRESS NOTE  Session Time: 1pm-1:50pm   Participation Level: Active  Behavioral Response: Well GroomedAlertAnxious and Depressed  Type of Therapy: Individual Therapy  Treatment Goals addressed: Anxiety, Communication: regarding family, daughters addiction and son's recent diagnosis with cancer and Coping  Interventions: CBT, Motivational Interviewing, Solution Focused, Strength-based and Supportive  Summary: Alex [name] is a 55 y.o. male who presents with anxious mood and affect. He reports an increase in his stress since last session and reports that his son has been diagnosed with rectal cancer and his daughter was in another car accident, which he believes was a suicide attempt. He processes his anxiety and fear over his son's health an discusses the family history of cancer. He expresses his anger towards his daughter regarding her addiction and her unwillingness lack of readiness to receive help. Patient describes surprising his daughter with a visit and finding her in her car surrounded by state troopers for driving in the wrong lane. He believes that this was a suicde attempt, but his wife did not support taking the car away from his daughter. His socialization remains limited, but he has followed up on the bible study group and is excited about a home church group. Conflict in his marriage remains. His sleep is disrupted by recent stressors and his appetite is wnr.    Suicidal/Homicidal: Nowithout intent/plan  Therapist Response: Assisted patient with the expression of his sadness, anxiety and frustration. Assisted him with his grief reaction related to his son's cancer. Normlaized his shock and fear. Challenged patients denial regarding the severity of his daughters addiction and his choice to leave the car with her. Provided feedback about his codependency with his wife, his strong denial about the safety of his daughter and conflcit avoidance. Used motivatioanl  interviewieng to assist patient through the change process involved in increased socialization. Patient demonstrates fear of his daughters and wifes reaction if he is to "take charge". Used CBT to help patient ideintify his irrational thought processes and encouraged him explore aleternative thinking. Recommend patient increase his socialization.   Plan: Return again in 10 weeks.  Diagnosis: Axis I: Bipolar, Depressed    Axis II: No diagnosis    Harless Molinari, LCSW 06/17/2011

## 2011-06-23 ENCOUNTER — Other Ambulatory Visit: Payer: Self-pay | Admitting: Internal Medicine

## 2011-06-28 ENCOUNTER — Telehealth: Payer: Self-pay

## 2011-06-28 ENCOUNTER — Ambulatory Visit (INDEPENDENT_AMBULATORY_CARE_PROVIDER_SITE_OTHER): Payer: BC Managed Care – PPO

## 2011-06-28 NOTE — Telephone Encounter (Signed)
The pt walked into the office after seeing Dr Annalee Genta for a sinus infection (Rx given for Avelox).  The pt has had a sinus infection for the past 3 weeks and has been on 2 antibiotics with no relief in symptoms.  Today in Dr Thurmon Fair office the pt's BP was 144/105.  Dr Annalee Genta told the pt to start Avelox and then check his BP after a few days. Before the pt left Dr Thurmon Fair office the nurse told him to make our office aware of BP.  The pt decided to stop by our office just to get his BP rechecked.  Upon recheck the pt's BP was 126/92 large cuff, left arm.  I reviewed the pt's records and the pt has a history of HTN and Dr Gala Romney had instructed the pt to follow-up with his PCP for further BP management.  The pt will check his BP at home and bring readings into his upcoming appt with Dr Jens Som. I further instructed the pt that he should also make a follow-up with his PCP Dr Posey Rea. The pt agreed with plan.

## 2011-07-01 ENCOUNTER — Other Ambulatory Visit (HOSPITAL_COMMUNITY): Payer: Self-pay | Admitting: Psychiatry

## 2011-07-01 NOTE — Telephone Encounter (Signed)
Lamictal refill requested by pharm. Per records patient should have a refill remaining on file. Contacted pharmacy. Refill found, this refill not filled.

## 2011-07-08 ENCOUNTER — Encounter (HOSPITAL_COMMUNITY): Payer: BC Managed Care – PPO | Admitting: Psychiatry

## 2011-07-09 ENCOUNTER — Ambulatory Visit (HOSPITAL_COMMUNITY): Payer: BC Managed Care – PPO | Admitting: Psychology

## 2011-07-10 ENCOUNTER — Ambulatory Visit (HOSPITAL_COMMUNITY): Payer: BC Managed Care – PPO | Admitting: Psychiatry

## 2011-07-13 HISTORY — PX: TRANSTHORACIC ECHOCARDIOGRAM: SHX275

## 2011-07-14 ENCOUNTER — Other Ambulatory Visit: Payer: Self-pay | Admitting: Internal Medicine

## 2011-07-15 ENCOUNTER — Telehealth: Payer: Self-pay | Admitting: Cardiology

## 2011-07-15 DIAGNOSIS — Z79899 Other long term (current) drug therapy: Secondary | ICD-10-CM

## 2011-07-15 DIAGNOSIS — E7800 Pure hypercholesterolemia, unspecified: Secondary | ICD-10-CM

## 2011-07-15 NOTE — Telephone Encounter (Signed)
Labs ordered [facility]

## 2011-07-15 NOTE — Telephone Encounter (Signed)
New message:  Pt would like to get a Lipid panel before his appt.  Please call and advise/  [phone]

## 2011-07-16 ENCOUNTER — Other Ambulatory Visit (INDEPENDENT_AMBULATORY_CARE_PROVIDER_SITE_OTHER): Payer: BC Managed Care – PPO

## 2011-07-16 DIAGNOSIS — Z79899 Other long term (current) drug therapy: Secondary | ICD-10-CM

## 2011-07-16 DIAGNOSIS — E7800 Pure hypercholesterolemia, unspecified: Secondary | ICD-10-CM

## 2011-07-16 LAB — LIPID PANEL
Cholesterol: 195 mg/dL (ref 0–200)
HDL: 64.6 mg/dL (ref >39.00)
LDL Cholesterol: 113 mg/dL — ABNORMAL HIGH (ref 0–99)
Total CHOL/HDL Ratio: 3
Triglycerides: 86 mg/dL (ref 0.0–149.0)
VLDL: 17.2 mg/dL (ref 0.0–40.0)

## 2011-07-16 LAB — HEPATIC FUNCTION PANEL
ALT: 26 U/L (ref 0–53)
AST: 28 U/L (ref 0–37)
Albumin: 4.2 g/dL (ref 3.5–5.2)
Alkaline Phosphatase: 63 U/L (ref 39–117)
Bilirubin, Direct: 0.1 mg/dL (ref 0.0–0.3)
Total Bilirubin: 0.8 mg/dL (ref 0.3–1.2)
Total Protein: 6.9 g/dL (ref 6.0–8.3)

## 2011-07-17 ENCOUNTER — Encounter: Payer: Self-pay | Admitting: *Deleted

## 2011-07-19 ENCOUNTER — Ambulatory Visit (HOSPITAL_COMMUNITY): Payer: BC Managed Care – PPO | Admitting: Psychiatry

## 2011-07-22 ENCOUNTER — Encounter: Payer: Self-pay | Admitting: Cardiology

## 2011-07-22 ENCOUNTER — Ambulatory Visit (INDEPENDENT_AMBULATORY_CARE_PROVIDER_SITE_OTHER): Payer: BC Managed Care – PPO | Admitting: Cardiology

## 2011-07-22 VITALS — BP 116/78 | HR 80 | Resp 18 | Ht 72.0 in | Wt 182.8 lb

## 2011-07-22 DIAGNOSIS — I2510 Atherosclerotic heart disease of native coronary artery without angina pectoris: Secondary | ICD-10-CM

## 2011-07-22 MED ORDER — ATORVASTATIN CALCIUM 40 MG PO TABS: 40.0000 mg | ORAL_TABLET | Freq: Every day | ORAL | Status: DC

## 2011-07-22 MED ORDER — ASPIRIN EC 81 MG PO TBEC: 81.0000 mg | DELAYED_RELEASE_TABLET | Freq: Every day | ORAL | Status: DC

## 2011-07-22 NOTE — Assessment & Plan Note (Signed)
Discontinue Pravachol and begin Lipitor with goal LDL less than 70.

## 2011-07-22 NOTE — Patient Instructions (Signed)
Your physician has requested that you have an echocardiogram. Echocardiography is a painless test that uses sound waves to create images of your heart. It provides your doctor with information about the size and shape of your heart and how well your heart's chambers and valves are working. This procedure takes approximately one hour. There are no restrictions for this procedure.  Your physician wants you to follow-up in: 12 months You will receive a reminder letter in the mail two months in advance. If you don't receive a letter, please call our office to schedule the follow-up appointment.  STOP PRAVACHOL  START LIPITOR 40 mg every night  Start coated aspirin 81 mg daily  Your physician recommends that you return for lab work in: 6 weeks after starting Lipitor

## 2011-07-22 NOTE — Assessment & Plan Note (Signed)
Patient with history of mild coronary disease. Add enteric-coated aspirin 81 mg daily. LDL not at goal. Discontinue Pravachol and begin Lipitor 40 mg daily with lipids and liver in 6 weeks. Electrocardiogram-cannot rule out prior inferior infarct. Check echocardiogram for wall motion. Continue risk factor modification.

## 2011-07-22 NOTE — Progress Notes (Signed)
HPI: Pleasant male for fu of CAD. Cath in 2007 showed a 40 LAD; EF 50-55. Had normal ETT 7/11 with 10:00 on Bruce.  Current Outpatient Prescriptions  Medication Sig Dispense Refill  . amLODipine (NORVASC) 5 MG tablet TAKE 1 TABLET EVERY DAY  90 tablet  0  . AVELOX 400 MG tablet       . Immune Globulin, Human, (HIZENTRA) 4 GM/20ML SOLN Inject into the skin once a week.        . LamoTRIgine (LAMICTAL XR) 300 MG TB24 Take by mouth daily.        [name] LORazepam (ATIVAN) 1 MG tablet Take 1 mg by mouth daily as needed.        [name] MICARDIS 80 MG tablet TAKE 1 TABLET BY MOUTH ONCE A DAY  90 tablet  0  . naproxen (NAPROSYN) 500 MG tablet Take 500 mg by mouth daily as needed.        . NON FORMULARY Various pain meds       . pravastatin (PRAVACHOL) 40 MG tablet Take 1 tablet (40 mg total) by mouth at bedtime.  90 tablet  3  . QVAR 40 MCG/ACT inhaler       . RABEprazole (ACIPHEX) 20 MG tablet Take 20 mg by mouth daily.        [name] Spacer/Aero-Holding Chambers (AEROCHAMBER PLUS) inhaler       . SYNTHROID 150 MCG tablet TAKE 1 TABLET EVERY DAY  90 tablet  0  . terbinafine (LAMISIL) 250 MG tablet       . testosterone (TESTIM) 50 MG/5GM GEL Place 5 g onto the skin daily.        . VENTOLIN HFA 108 (90 BASE) MCG/ACT inhaler       . VOLTAREN 1 % GEL       . zolpidem (AMBIEN) 10 MG tablet Take 10 mg by mouth at bedtime as needed.           Past Medical History  Diagnosis Date  . Bipolar disorder   . Anxiety   . Cancer     adenosquamous of eye and neck  . Depression   . Thyroid disease     thyroid cancer  . Hypertension   . Hyperlipidemia   . Nephrolithiasis   . Immune deficiency disorder   . GERD (gastroesophageal reflux disease)     Past Surgical History  Procedure Date  . Orthopedic surgery   . Hernia repair   . Thyroidectomy   . Occuloplastic surgery   . Ankle surgery   . Shoulder surgery     History   Social History  . Marital Status: Married    Spouse Name: N/A    Number of Children:  N/A  . Years of Education: N/A   Occupational History  . Not on file.   Social History Main Topics  . Smoking status: Never Smoker   . Smokeless tobacco: Not on file  . Alcohol Use: No  . Drug Use: No  . Sexually Active: Yes    Birth Control/ Protection: Other-see comments   Other Topics Concern  . Not on file   Social History Narrative  . No narrative on file    ROS: Back pain and recent URI but no hemoptysis, dysphasia, odynophagia, melena, hematochezia, dysuria, hematuria, rash, seizure activity, orthopnea, PND, pedal edema, claudication. Remaining systems are negative.  Physical Exam: Well-developed well-nourished in no acute distress.  Skin is warm and dry.  HEENT is normal.  Neck is supple.  No thyromegaly.  Chest is clear to auscultation with normal expansion.  Cardiovascular exam is regular rate and rhythm.  Abdominal exam nontender or distended. No masses palpated. Extremities show no edema. neuro grossly intact  ECG NSR, CRO prior IMI

## 2011-07-22 NOTE — Assessment & Plan Note (Signed)
Blood pressure controlled. Continue present medications. 

## 2011-07-25 ENCOUNTER — Ambulatory Visit (INDEPENDENT_AMBULATORY_CARE_PROVIDER_SITE_OTHER): Payer: BC Managed Care – PPO | Admitting: Psychology

## 2011-07-25 DIAGNOSIS — F3132 Bipolar disorder, current episode depressed, moderate: Secondary | ICD-10-CM

## 2011-07-25 NOTE — Progress Notes (Signed)
   THERAPIST PROGRESS NOTE  Session Time: 4098-1191  Participation Level: Active  Behavioral Response: Well GroomedAlertDepressed  Type of Therapy: Individual Therapy  Treatment Goals addressed: Coping  Interventions: CBT  Summary: Alex [name] is a 55 y.o. male who presents with rapid speech and depressed mood.  We discussed a number of current stressors, chronic pain, financial, family illness (son treated for rectal cancer), daughter's misbehavior and legal involvements with her drug abuse, and marital disaffection. [name] admitted he was "here to dump all this on you".  He detailed his history with his wife and the current status, saying he is prevented from suicide by the thought that "that would make it too easy for his wife".   His conservative religious beliefs do not let him contemplate actually divorcing her although he describes living with her as miserable.  He has two children (of 7) still at home and is worried about how his life will be when they are gone.  He admits to marital arguments, (eruptions of all he has held in on his part), but has some positive interactions as he plays a major role in caring for their youngest daughter, age 44. With regard to his goals, to develop his own circle of friends, he admits he has made no progress.  He has had one thought about someone he would like to get to know, but has made no move in that direction.  I confronted him about the rationalizations he came up with for not having acted.  He has good insight and judgment, blunted affect, depressed mood and knowledge of what he could be doing that might be helpful.    Suicidal/Homicidal: Nowithout intent/plan  Therapist Response: NA  Plan: Return again in 5 weeks. To see regular counselor  Diagnosis: Axis I: Bipolar, Depressed    Axis II: Deferred    Reece Mcbroom, RN 07/25/2011

## 2011-07-29 ENCOUNTER — Other Ambulatory Visit (HOSPITAL_COMMUNITY): Payer: BC Managed Care – PPO | Admitting: Radiology

## 2011-07-30 ENCOUNTER — Other Ambulatory Visit (HOSPITAL_COMMUNITY): Payer: BC Managed Care – PPO | Admitting: Radiology

## 2011-08-01 ENCOUNTER — Ambulatory Visit (HOSPITAL_COMMUNITY): Payer: BC Managed Care – PPO | Attending: Cardiology | Admitting: Radiology

## 2011-08-01 DIAGNOSIS — J4489 Other specified chronic obstructive pulmonary disease: Secondary | ICD-10-CM | POA: Insufficient documentation

## 2011-08-01 DIAGNOSIS — F411 Generalized anxiety disorder: Secondary | ICD-10-CM | POA: Insufficient documentation

## 2011-08-01 DIAGNOSIS — J449 Chronic obstructive pulmonary disease, unspecified: Secondary | ICD-10-CM | POA: Insufficient documentation

## 2011-08-01 DIAGNOSIS — I059 Rheumatic mitral valve disease, unspecified: Secondary | ICD-10-CM | POA: Insufficient documentation

## 2011-08-01 DIAGNOSIS — I079 Rheumatic tricuspid valve disease, unspecified: Secondary | ICD-10-CM | POA: Insufficient documentation

## 2011-08-01 DIAGNOSIS — R5381 Other malaise: Secondary | ICD-10-CM | POA: Insufficient documentation

## 2011-08-01 DIAGNOSIS — I2510 Atherosclerotic heart disease of native coronary artery without angina pectoris: Secondary | ICD-10-CM | POA: Insufficient documentation

## 2011-08-01 DIAGNOSIS — R079 Chest pain, unspecified: Secondary | ICD-10-CM | POA: Insufficient documentation

## 2011-08-01 DIAGNOSIS — I10 Essential (primary) hypertension: Secondary | ICD-10-CM | POA: Insufficient documentation

## 2011-08-01 DIAGNOSIS — E785 Hyperlipidemia, unspecified: Secondary | ICD-10-CM | POA: Insufficient documentation

## 2011-08-01 DIAGNOSIS — R5383 Other fatigue: Secondary | ICD-10-CM | POA: Insufficient documentation

## 2011-08-01 DIAGNOSIS — I379 Nonrheumatic pulmonary valve disorder, unspecified: Secondary | ICD-10-CM | POA: Insufficient documentation

## 2011-08-07 ENCOUNTER — Other Ambulatory Visit (HOSPITAL_COMMUNITY): Payer: Self-pay | Admitting: Physician Assistant

## 2011-08-07 DIAGNOSIS — F319 Bipolar disorder, unspecified: Secondary | ICD-10-CM

## 2011-08-07 MED ORDER — LORAZEPAM 1 MG PO TABS: 1.0000 mg | ORAL_TABLET | Freq: Every evening | ORAL | Status: AC | PRN

## 2011-08-08 ENCOUNTER — Other Ambulatory Visit: Payer: Self-pay | Admitting: Internal Medicine

## 2011-08-14 ENCOUNTER — Telehealth: Payer: Self-pay | Admitting: Cardiology

## 2011-08-14 NOTE — Telephone Encounter (Signed)
Pt would like results of his echo

## 2011-08-14 NOTE — Telephone Encounter (Signed)
Spoke with pt, aware of echo results. 

## 2011-08-19 ENCOUNTER — Other Ambulatory Visit: Payer: Self-pay | Admitting: Cardiology

## 2011-08-19 ENCOUNTER — Encounter (HOSPITAL_COMMUNITY): Payer: BC Managed Care – PPO | Admitting: Licensed Clinical Social Worker

## 2011-08-19 NOTE — Telephone Encounter (Signed)
New problem Pt wants 90 day supply of atorvastatin [facility]

## 2011-08-20 ENCOUNTER — Other Ambulatory Visit: Payer: Self-pay | Admitting: Dermatology

## 2011-08-22 ENCOUNTER — Other Ambulatory Visit: Payer: Self-pay | Admitting: Cardiology

## 2011-08-22 MED ORDER — ATORVASTATIN CALCIUM 40 MG PO TABS: 40.0000 mg | ORAL_TABLET | Freq: Every day | ORAL | Status: DC

## 2011-09-02 ENCOUNTER — Encounter (HOSPITAL_COMMUNITY): Payer: Self-pay | Admitting: Licensed Clinical Social Worker

## 2011-09-02 ENCOUNTER — Ambulatory Visit (INDEPENDENT_AMBULATORY_CARE_PROVIDER_SITE_OTHER): Payer: BC Managed Care – PPO | Admitting: Licensed Clinical Social Worker

## 2011-09-02 DIAGNOSIS — F319 Bipolar disorder, unspecified: Secondary | ICD-10-CM

## 2011-09-02 NOTE — Progress Notes (Signed)
   THERAPIST PROGRESS NOTE  Session Time: 11:30am-12:20pm  Participation Level: Active  Behavioral Response: Well GroomedAlertAnxious, Depressed and Hopeless  Type of Therapy: Individual Therapy  Treatment Goals addressed: Anxiety, Communication: with family and Coping  Interventions: CBT, Motivational Interviewing, Strength-based and Supportive  Summary: Alex [name] is a 56 y.o. male who presents with depressed/anxious mood and anxious affect. Patient was treated by Shonna Chock while this therapist was on leave but only saw her once. He found this helpful. He reports feeling depressed, anxious, hopeless and frustrated. He is under financial stress and does not receive help from his wife despite her working full time. He is angry with his kids whom he pays car insurance for and cell phone bills. He is upset and worried about his son with rectal cancer and feels limited in his ability to help his son because he lives in [place]. Patient's stressors remain the same regarding his daughter who is abusing drugs, his wife and his wife's friend who complicates the relationship. He has not become active or increased his socialization. He is fearful of being rejected. His sleep and appetite are wnl. He is focused on his pain level today, which is high.    Suicidal/Homicidal: Nowithout intent/plan  Therapist Response: Patient has not made progress related to managing his own stress. He is dependent upon his wife to "change" in order for his life to be more manageable. Processed this pattern with patient, confronted his resistance to change and provided feedback to his distorted thinking. Encouraged patient to get involved in the community, such as volunteer to distract his thoughts from his problems and catastrophic thinking. Used motivational interviewing to assist and encourage patient through the change process. Explored patients barriers to change. Used CBT to assist patient with the  identification of his negative distortions and irrational thoughts. Encouraged patient to verbalize alternative and factual responses which challenge his distortions. Reviewed patients self care plan. Assessed his progress related to self care. Patient's self care is fair. Recommend proper diet, regular exercise, socialization and recreation.   Plan: Return again in two weeks.  Diagnosis: Axis I: Bi-polar disorder     Axis II: No diagnosis    Eiza Canniff, LCSW 09/02/2011

## 2011-09-03 ENCOUNTER — Other Ambulatory Visit: Payer: Self-pay | Admitting: Internal Medicine

## 2011-09-09 ENCOUNTER — Other Ambulatory Visit: Payer: BC Managed Care – PPO

## 2011-09-16 ENCOUNTER — Ambulatory Visit (HOSPITAL_COMMUNITY): Payer: BC Managed Care – PPO | Admitting: Licensed Clinical Social Worker

## 2011-09-19 ENCOUNTER — Ambulatory Visit (HOSPITAL_COMMUNITY): Payer: BC Managed Care – PPO | Admitting: Licensed Clinical Social Worker

## 2011-09-23 ENCOUNTER — Ambulatory Visit (INDEPENDENT_AMBULATORY_CARE_PROVIDER_SITE_OTHER): Payer: BC Managed Care – PPO | Admitting: Psychiatry

## 2011-09-23 ENCOUNTER — Encounter (HOSPITAL_COMMUNITY): Payer: Self-pay | Admitting: Psychiatry

## 2011-09-23 VITALS — BP 151/104 | HR 78 | Ht 71.0 in | Wt 181.0 lb

## 2011-09-23 DIAGNOSIS — F319 Bipolar disorder, unspecified: Secondary | ICD-10-CM

## 2011-09-23 MED ORDER — ZOLPIDEM TARTRATE 10 MG PO TABS: 10.0000 mg | ORAL_TABLET | Freq: Every evening | ORAL | Status: DC | PRN

## 2011-09-23 MED ORDER — LAMOTRIGINE 150 MG PO TABS: 150.0000 mg | ORAL_TABLET | Freq: Every day | ORAL | Status: DC

## 2011-09-23 MED ORDER — LORAZEPAM 1 MG PO TABS: 1.0000 mg | ORAL_TABLET | Freq: Every evening | ORAL | Status: DC | PRN

## 2011-09-23 NOTE — Progress Notes (Signed)
Chief complaint I need my medication  History of presenting illness Patient is 56 year old Caucasian married man who came for his followup appointment. Patient was last seen in September 2012. He missed the appointment due to weather however remains compliant with the medication. He continues to have episodic agitation anger and mood swings. Recently he has seen his primary care doctor who has prescribed pain medication for chronic pain. Patient also has tick disorder. He continued to endorse significant family issues. He has been seeing therapist in this office. His daughter is still uses drugs and believe she is addicted however patient has difficult time convincing her wife and family about this issue. He reported no side effects of medication. He is sleeping 4-5 hours every night. He takes Ativan as needed along with Ambien as needed. Denies any rash or itching with Lamictal.  Past psychiatric illness Patient has been seeing in this office since 2009. He has history of depression mood swing. [facility]. At that time he was stressed about the finances and family issues. In the past he has taken Seroquel and Cymbalta.  Family history of psychiatric illness Patient's sister has alcohol issues and depression. One sister was admitted inpatient in [facility]  Alcohol and substance use history Patient denies any history of alcohol or substance use.  Medical history Patient has history of hypertension herpetic anemia, immune deficiency disorder, and GERD and tic disorder  Surgical history Patient has history of thyroidectomy, ankle surgery, shoulder surgery, oculoplastic surgery and orthopedic surgery.  Psychosocial history Patient lives with her wife and daughter. Her daughter lives in [place] area.  Lab results His last lab results was done in September 2012. He has normal CBC and basic chemistry however his cholesterol  is high.    Mental status examination Patient is casually dressed and fairly groomed. He is anxious but relevant in conversation. He appears at times restless and having tic movements. His thought processes logical linear and goal-directed. Denies any orderly or visual hallucination. There no psychotic symptoms present. He described his mood is anxious and depressed. He denies any active or passive suicidal thoughts or homicidal thoughts. His attention and concentration is fair. His alert and oriented x3. His insight judgment and pulse control is okay.  Assessment Axis I mood disorder NOS, bipolar disorder 1 Axis II deferred Axis III see medical history Axis IV mild to moderate Axis V 55-65  Plan I reviewed his medication. He's been prescribed multiple pain medication however patient does not take these pain medication everyday. I explained risks and benefits of medication especially interaction of Ambien Ativan with pain medication. Patient is not abusing his pain medication or asking early refill of his Ativan or Ambien. He is taking Lamictal regularly and reported any rash or itching. He is stable on the current regimen of medication. I will continue Lamictal 150 twice a day along with Ambien 10 mg as needed for insomnia and Ativan 1 mg as needed for severe anxiety. He'll continue to see therapist for increase coping and social skills. I will see him again in 6-8 weeks. Time spent 30 minutes

## 2011-09-26 ENCOUNTER — Telehealth: Payer: Self-pay | Admitting: Cardiology

## 2011-09-26 NOTE — Telephone Encounter (Signed)
Spoke with pt, paper work in pts chart and faxed top the number provided.

## 2011-09-26 NOTE — Telephone Encounter (Signed)
New Problem   Patient calling regarding health assessment documentation that  Was supposed to be mailed back to patient at home regarding health insurance.  Please return call to patient at hm# [phone] as he has not received it.

## 2011-09-27 ENCOUNTER — Ambulatory Visit (HOSPITAL_COMMUNITY): Payer: BC Managed Care – PPO | Admitting: Licensed Clinical Social Worker

## 2011-09-30 ENCOUNTER — Other Ambulatory Visit (HOSPITAL_COMMUNITY): Payer: Self-pay | Admitting: Psychiatry

## 2011-09-30 ENCOUNTER — Other Ambulatory Visit (HOSPITAL_COMMUNITY): Payer: Self-pay | Admitting: *Deleted

## 2011-09-30 MED ORDER — LAMOTRIGINE 150 MG PO TABS: 300.0000 mg | ORAL_TABLET | Freq: Every day | ORAL | Status: DC

## 2011-10-02 ENCOUNTER — Telehealth: Payer: Self-pay | Admitting: Cardiology

## 2011-10-02 NOTE — Telephone Encounter (Signed)
Spoke with pt, paperwork complete and faxed in

## 2011-10-02 NOTE — Telephone Encounter (Signed)
New msg Pt was calling back about paperwork that was faxed to [facility]. He said it was missing something please call

## 2011-10-03 ENCOUNTER — Other Ambulatory Visit: Payer: Self-pay | Admitting: *Deleted

## 2011-10-03 MED ORDER — LEVOTHYROXINE SODIUM 150 MCG PO TABS: 150.0000 ug | ORAL_TABLET | Freq: Every day | ORAL | Status: DC

## 2011-10-10 ENCOUNTER — Telehealth: Payer: Self-pay | Admitting: Cardiology

## 2011-10-10 NOTE — Telephone Encounter (Signed)
Spoke with pt, paperwork completed and faxed a third time.

## 2011-10-10 NOTE — Telephone Encounter (Signed)
Patient would like a return call concerning [facility] Documents is missing information.  Please return call to patient at hm# [phone] ASAP.

## 2011-10-16 ENCOUNTER — Ambulatory Visit (HOSPITAL_COMMUNITY): Payer: Self-pay | Admitting: Licensed Clinical Social Worker

## 2011-10-18 ENCOUNTER — Telehealth: Payer: Self-pay | Admitting: Cardiology

## 2011-10-18 DIAGNOSIS — I2510 Atherosclerotic heart disease of native coronary artery without angina pectoris: Secondary | ICD-10-CM

## 2011-10-18 NOTE — Telephone Encounter (Signed)
Spoke with pt, will add tsh and cbc to labs. Pt PCP will need to check testosterone.

## 2011-10-18 NOTE — Telephone Encounter (Signed)
York Spaniel he has a standing blood test order, wants to add  tst, tsh, cbc/ret, if ok pls call [phone]

## 2011-10-21 ENCOUNTER — Other Ambulatory Visit: Payer: Self-pay | Admitting: Internal Medicine

## 2011-10-22 ENCOUNTER — Other Ambulatory Visit (INDEPENDENT_AMBULATORY_CARE_PROVIDER_SITE_OTHER): Payer: BC Managed Care – PPO

## 2011-10-22 DIAGNOSIS — I2510 Atherosclerotic heart disease of native coronary artery without angina pectoris: Secondary | ICD-10-CM

## 2011-10-22 LAB — CBC WITH DIFFERENTIAL/PLATELET
Basophils Absolute: 0 10*3/uL (ref 0.0–0.1)
Basophils Relative: 0.2 % (ref 0.0–3.0)
Eosinophils Absolute: 0.1 10*3/uL (ref 0.0–0.7)
Eosinophils Relative: 0.7 % (ref 0.0–5.0)
HCT: 48 % (ref 39.0–52.0)
Hemoglobin: 15.7 g/dL (ref 13.0–17.0)
Lymphocytes Relative: 13.9 % (ref 12.0–46.0)
Lymphs Abs: 1.9 10*3/uL (ref 0.7–4.0)
MCHC: 32.8 g/dL (ref 30.0–36.0)
MCV: 85.6 fl (ref 78.0–100.0)
Monocytes Absolute: 1.2 10*3/uL — ABNORMAL HIGH (ref 0.1–1.0)
Monocytes Relative: 8.6 % (ref 3.0–12.0)
Neutro Abs: 10.5 10*3/uL — ABNORMAL HIGH (ref 1.4–7.7)
Neutrophils Relative %: 76.6 % (ref 43.0–77.0)
Platelets: 260 10*3/uL (ref 150.0–400.0)
RBC: 5.61 Mil/uL (ref 4.22–5.81)
RDW: 16.2 % — ABNORMAL HIGH (ref 11.5–14.6)
WBC: 13.7 10*3/uL — ABNORMAL HIGH (ref 4.5–10.5)

## 2011-10-22 LAB — TSH: TSH: 4.44 u[IU]/mL (ref 0.35–5.50)

## 2011-10-24 ENCOUNTER — Telehealth: Payer: Self-pay | Admitting: *Deleted

## 2011-10-24 MED ORDER — LEVOFLOXACIN 500 MG PO TABS: 500.0000 mg | ORAL_TABLET | Freq: Every day | ORAL | Status: DC

## 2011-10-24 NOTE — Telephone Encounter (Signed)
Ok Levaquin x 10 d [facility] pls Thx

## 2011-10-24 NOTE — Telephone Encounter (Signed)
Pt left vm c/o sx of prostatitis and he states Dr. Jens Som ordered labs and his WBCs were elevated. He is requesting abx for this. Please advise.

## 2011-10-24 NOTE — Telephone Encounter (Signed)
Left mess for patient to call back.  

## 2011-10-28 NOTE — Telephone Encounter (Signed)
Pt informed/transferred to scheduler.  

## 2011-10-29 ENCOUNTER — Ambulatory Visit (INDEPENDENT_AMBULATORY_CARE_PROVIDER_SITE_OTHER): Payer: BC Managed Care – PPO | Admitting: Licensed Clinical Social Worker

## 2011-10-29 DIAGNOSIS — F3132 Bipolar disorder, current episode depressed, moderate: Secondary | ICD-10-CM

## 2011-10-31 ENCOUNTER — Encounter (HOSPITAL_COMMUNITY): Payer: Self-pay | Admitting: Licensed Clinical Social Worker

## 2011-10-31 NOTE — Progress Notes (Signed)
   THERAPIST PROGRESS NOTE  Session Time: 1:00pm-1:50pm  Participation Level: Active  Behavioral Response: Well GroomedAlertAnxious, Depressed and Irritable  Type of Therapy: Individual Therapy  Treatment Goals addressed: Anxiety and Coping  Interventions: CBT, Motivational Interviewing, Solution Focused, Strength-based and Supportive  Summary: Alex [name] is a 56 y.o. male who presents with depressed mood and anxious/irritable affect. He reports overwhelming stress related to his sons diagnosis of cancer and his wife having a suspected stroke. He describes the specifics of these events and endorses feeling frightened upon finding his wife on the floor of the movie theatre, non responsive. His is now angry with his wife because she is not following doctors recommendations and is driving and working, climbing on [employer]. Patient is grieved about his son's poor health is fearful for his future. He feels that he "held it together" as good as he could have during these events. He is agitated today and endorses feeling helpless. His sleep is poor and his appetite is wnl.   Suicidal/Homicidal: Nowithout intent/plan  Therapist Response: Processed patients experiences and encouraged him to explore his feelings. Normalized his fear and frustration with his wife. Explored how he can manage his feelings of helplessness and how he is taking care of himself. He has begun sailing a remote control boat and finds this as a means to decrease his stress. His thinking is rigid about following his own goals to find wellness and he needs redirection. Used CBT to assist patient with the identification of his negative distortions and irrational thoughts. Encouraged patient to verbalize alternative and factual responses which challenge his distortions. Reviewed patients self care plan. Assessed his progress related to self care. Patient's self care is fair. Recommend proper diet, regular exercise, socialization  and recreation. Used motivational interviewing to assist and encourage patient through the change process. Explored patients barriers to change.   Plan: Return again in two weeks.  Diagnosis: Axis I: Bipolar, Depressed    Axis II: No diagnosis    Kayce Chismar, LCSW 10/31/2011

## 2011-11-04 ENCOUNTER — Encounter: Payer: Self-pay | Admitting: Internal Medicine

## 2011-11-04 ENCOUNTER — Ambulatory Visit (INDEPENDENT_AMBULATORY_CARE_PROVIDER_SITE_OTHER): Payer: BC Managed Care – PPO | Admitting: Internal Medicine

## 2011-11-04 VITALS — BP 140/100 | HR 84 | Temp 97.3°F | Resp 16 | Wt 179.0 lb

## 2011-11-04 DIAGNOSIS — I10 Essential (primary) hypertension: Secondary | ICD-10-CM

## 2011-11-04 DIAGNOSIS — I2510 Atherosclerotic heart disease of native coronary artery without angina pectoris: Secondary | ICD-10-CM

## 2011-11-04 DIAGNOSIS — R799 Abnormal finding of blood chemistry, unspecified: Secondary | ICD-10-CM

## 2011-11-04 DIAGNOSIS — N453 Epididymo-orchitis: Secondary | ICD-10-CM

## 2011-11-04 DIAGNOSIS — D839 Common variable immunodeficiency, unspecified: Secondary | ICD-10-CM

## 2011-11-04 MED ORDER — ACIPHEX 20 MG PO TBEC: 20.0000 mg | DELAYED_RELEASE_TABLET | Freq: Every day | ORAL | Status: DC

## 2011-11-04 MED ORDER — TADALAFIL 5 MG PO TABS: 5.0000 mg | ORAL_TABLET | Freq: Every day | ORAL | Status: DC | PRN

## 2011-11-04 MED ORDER — SYNTHROID 150 MCG PO TABS: 150.0000 ug | ORAL_TABLET | Freq: Every day | ORAL | Status: DC

## 2011-11-04 NOTE — Assessment & Plan Note (Signed)
On Avelox now

## 2011-11-04 NOTE — Assessment & Plan Note (Signed)
Continue with current prescription therapy as reflected on the Med list.  

## 2011-11-04 NOTE — Assessment & Plan Note (Signed)
On IgG

## 2011-11-04 NOTE — Assessment & Plan Note (Signed)
F/u w/Dr Magrinat 

## 2011-11-04 NOTE — Progress Notes (Signed)
  Subjective:    Patient ID: Alex [name], male    DOB: 09-Nov-1955, 56 y.o.   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  BP Readings from Last 3 Encounters:  11/04/11 140/100  09/23/11 151/104  07/22/11 116/78   Wt Readings from Last 3 Encounters:  11/04/11 179 lb (81.194 kg)  09/23/11 181 lb (82.101 kg)  07/22/11 182 lb 12.8 oz (82.918 kg)       Review of Systems  Constitutional: Negative for appetite change, fatigue and unexpected weight change.  HENT: Negative for nosebleeds, congestion, sore throat, sneezing, trouble swallowing and neck pain.   Eyes: Negative for itching and visual disturbance.  Respiratory: Negative for cough.   Cardiovascular: Negative for chest pain, palpitations and leg swelling.  Gastrointestinal: Negative for nausea, diarrhea, blood in stool and abdominal distention.  Genitourinary: Negative for frequency and hematuria.  Musculoskeletal: Negative for back pain, joint swelling and gait problem.  Skin: Negative for rash.  Neurological: Negative for dizziness, tremors, speech difficulty and weakness.  Psychiatric/Behavioral: Negative for suicidal ideas, sleep disturbance, dysphoric mood and agitation. The patient is nervous/anxious.        Objective:   Physical Exam  Constitutional: He is oriented to person, place, and time. He appears well-developed. No distress.  HENT:  Mouth/Throat: Oropharynx is clear and moist.  Eyes: Conjunctivae are normal. Pupils are equal, round, and reactive to light.  Neck: Normal range of motion. No JVD present. No thyromegaly present.  Cardiovascular: Normal rate, regular rhythm, normal heart sounds and intact distal pulses.  Exam reveals no gallop and no friction rub.   No murmur  heard. Pulmonary/Chest: Effort normal and breath sounds normal. No respiratory distress. He has no wheezes. He has no rales. He exhibits no tenderness.  Abdominal: Soft. Bowel sounds are normal. He exhibits no distension and no mass. There is no tenderness. There is no rebound and no guarding.  Genitourinary: Rectum normal and prostate normal. Guaiac negative stool.       Testes nl  Musculoskeletal: Normal range of motion. He exhibits no edema and no tenderness.  Lymphadenopathy:    He has no cervical adenopathy.  Neurological: He is alert and oriented to person, place, and time. He has normal reflexes. No cranial nerve deficit. He exhibits normal muscle tone. Coordination normal.  Skin: Skin is warm and dry. No rash noted.  Psychiatric: He has a normal mood and affect. His behavior is normal. Judgment and thought content normal.       anxious   Lab Results  Component Value Date   WBC 13.7* 10/22/2011   HGB 15.7 10/22/2011   HCT 48.0 10/22/2011   PLT 260.0 10/22/2011   GLUCOSE 74 05/10/2011   CHOL 195 07/16/2011   TRIG 86.0 07/16/2011   HDL 64.60 07/16/2011   LDLDIRECT 134.7 03/05/2007   LDLCALC 113* 07/16/2011   ALT 26 07/16/2011   AST 28 07/16/2011   NA 139 05/10/2011   K 4.2 05/10/2011   CL 103 05/10/2011   CREATININE 1.0 05/10/2011   BUN 15 05/10/2011   CO2 28 05/10/2011   TSH 4.44 10/22/2011   PSA 0.82 06/22/2010          Assessment & Plan:

## 2011-11-06 ENCOUNTER — Ambulatory Visit (INDEPENDENT_AMBULATORY_CARE_PROVIDER_SITE_OTHER): Payer: BC Managed Care – PPO | Admitting: Psychiatry

## 2011-11-06 ENCOUNTER — Encounter (HOSPITAL_COMMUNITY): Payer: Self-pay | Admitting: Psychiatry

## 2011-11-06 DIAGNOSIS — F319 Bipolar disorder, unspecified: Secondary | ICD-10-CM

## 2011-11-06 MED ORDER — LORAZEPAM 1 MG PO TABS: 1.0000 mg | ORAL_TABLET | Freq: Every evening | ORAL | Status: DC | PRN

## 2011-11-06 NOTE — Progress Notes (Signed)
Chief complaint I am under stress .    History of presenting illness Patient is 56 year old Caucasian married man who came for his followup appointment.  Patient endorsed multiple stressors in his life since he was last seen .  His son has chronic illness and recently he has been admitted to do costal .  He is very worried about his son .  He's also very concerned about his wife .  He endorse that he was visiting [facility] with his wife on his anniversary when his wife has a hypoglycemic event when she was found unconscious and she was taken to the emergency wrong .  She stayed 2 days there and it turned out to be a hypoglycemia .  His wife is scheduled to see endocrinologist however appointment is 2 months apart .  Patient is very concerned about financial distress and medical bills .  He admitted increase anxiety poor sleep and feeling overwhelmed .  He's been compliant with her psychiatric medication and reported no side effects.  He continues to have episodic agitation anger but overall they are less intense .  He is sleeping 4-5 hours every night and feel lorazepam working very well.  He denies any active or passive suicidal thinking and homicidal thinking.  He denies any drinking or using drugs.  He reported no side effects of medication.  Past psychiatric illness Patient has been seeing in this office since 2009. He has history of depression mood swing. [facility]. At that time he was stressed about the finances and family issues. In the past he has taken Seroquel and Cymbalta.  Family history of psychiatric illness Patient's sister has alcohol issues and depression. One sister was admitted inpatient in [facility]  Alcohol and substance use history Patient denies any history of alcohol or substance use.  Medical history Patient has history of hypertension herpetic anemia, immune deficiency disorder, and GERD and tic  disorder  Surgical history Patient has history of thyroidectomy, ankle surgery, shoulder surgery, oculoplastic surgery and orthopedic surgery.  Psychosocial history Patient lives with her wife and daughter. Her daughter lives in [place] area.  Lab results His last lab results was done in 10/22/2011.  His CBC shows increased WBC count and increase neutrophils.  His TSH was within normal limits.   He see Dr. Sinclair Grooms..  Mental status examination Patient is casually dressed and fairly groomed. He is anxious but relevant in conversation. He appears at times restless and having tic movements.  His speech is fast but clear and coherent.  His thought processes logical linear and goal-directed. He denies any auditory or visual hallucination. There no psychotic symptoms present. He described his mood is anxious and depressed. He denies any active or passive suicidal thoughts or homicidal thoughts. His attention and concentration is fair. His alert and oriented x3. His insight judgment and pulse control is okay.  Assessment Axis I mood disorder NOS, bipolar disorder 1 Axis II deferred Axis III see medical history Axis IV mild to moderate Axis V 55-65  Plan I reviewed psychosocial stressors, medication, current laboratory results and previous progress notes.  Reassurance given .  I recommended to see therapist regularly for increase coping and social skills.  I will continue his current medication which is working well at this time.  At this time patient reported no side effects of medication including any rash.  He is not abusing his Ambien and Ativan.  He is not  drinking or using any illegal substances.  I recommended to call us if he has noticed worsening of her symptoms or any time having suicidal thoughts or homicidal thoughts.  Otherwise I will see him again in 2 months.  Time spent 30 minutes.

## 2011-11-11 ENCOUNTER — Other Ambulatory Visit: Payer: Self-pay

## 2011-11-11 ENCOUNTER — Ambulatory Visit (INDEPENDENT_AMBULATORY_CARE_PROVIDER_SITE_OTHER): Payer: BC Managed Care – PPO | Admitting: Licensed Clinical Social Worker

## 2011-11-11 DIAGNOSIS — F3132 Bipolar disorder, current episode depressed, moderate: Secondary | ICD-10-CM

## 2011-11-11 MED ORDER — AMLODIPINE BESYLATE 5 MG PO TABS: 5.0000 mg | ORAL_TABLET | Freq: Every day | ORAL | Status: DC

## 2011-11-11 NOTE — Progress Notes (Signed)
   THERAPIST PROGRESS NOT   Session Time: 1:00pm-1:50pm  Participation Level: Active  Behavioral Response: Well GroomedAlertAnxious  Type of Therapy: Individual Therapy  Treatment Goals addressed: Anxiety and Coping  Interventions: CBT, Motivational Interviewing, Supportive and Reframing  Summary: Alex [name] is a 56 y.o. male who presents with anxious mood and affect. He reports ongoing stress at home related to his wife's illness, his daughters addiction and learning that his son has been self injuring himself which has lead to his collapsed lung, perforation of his esophagus and other problems for which he has received medical treatment. Patient is upset, fearful for his son, but pleased that he is receiving treatment at [facility]. He expresses anger with his wife's response of prejudice about mental illness and continues to seek approval and acceptance by her. He has been taking better care of himself by using his stress reduction techniques. His sleep and appetite are wnl.   Suicidal/Homicidal: Nowithout intent/plan  Therapist Response: Processed with patient his feelings related to his son hurting himself. Provided education for patient about SIB. Processed patients sadness that his son wants to talk to his mother about this, instead of patient. Explored his thoughts about why his children don't confide in him when they are in trouble. Praised him for increased attention to his self care and encouraged him to start exercising. He is focused on somatic complaints but is able to walk. Used CBT to assist patient with the identification of hsi negative distortions and irrational thoughts. Encouraged patient to verbalize alternative and factual responses which challenge his distortions. Used motivational interviewing to assist and encourage patient through the change process. Explored patients barriers to change. Reviewed patients self care plan. Assessed his progress related to self care.  Patient's self care is good. Recommend proper diet, regular exercise, socialization and recreation.   Plan: Return again in two weeks.  Diagnosis: Axis I: Bipolar, Depressed    Axis II: No diagnosis    Latausha Flamm, LCSW 11/11/2011

## 2011-11-13 ENCOUNTER — Other Ambulatory Visit: Payer: Self-pay | Admitting: *Deleted

## 2011-11-13 MED ORDER — AMLODIPINE BESYLATE 5 MG PO TABS: 5.0000 mg | ORAL_TABLET | Freq: Every day | ORAL | Status: DC

## 2011-11-19 ENCOUNTER — Telehealth: Payer: Self-pay | Admitting: Cardiology

## 2011-11-19 NOTE — Telephone Encounter (Signed)
ERROR

## 2011-11-21 ENCOUNTER — Other Ambulatory Visit (INDEPENDENT_AMBULATORY_CARE_PROVIDER_SITE_OTHER): Payer: BC Managed Care – PPO

## 2011-11-21 DIAGNOSIS — I2510 Atherosclerotic heart disease of native coronary artery without angina pectoris: Secondary | ICD-10-CM

## 2011-11-21 LAB — HEPATIC FUNCTION PANEL
ALT: 34 U/L (ref 0–53)
AST: 24 U/L (ref 0–37)
Albumin: 4.3 g/dL (ref 3.5–5.2)
Alkaline Phosphatase: 52 U/L (ref 39–117)
Bilirubin, Direct: 0.1 mg/dL (ref 0.0–0.3)
Total Bilirubin: 0.6 mg/dL (ref 0.3–1.2)
Total Protein: 7 g/dL (ref 6.0–8.3)

## 2011-11-21 LAB — LIPID PANEL
Cholesterol: 154 mg/dL (ref 0–200)
HDL: 67.2 mg/dL (ref >39.00)
LDL Cholesterol: 71 mg/dL (ref 0–99)
Total CHOL/HDL Ratio: 2
Triglycerides: 81 mg/dL (ref 0.0–149.0)
VLDL: 16.2 mg/dL (ref 0.0–40.0)

## 2011-11-25 ENCOUNTER — Encounter (HOSPITAL_COMMUNITY): Payer: Self-pay | Admitting: Licensed Clinical Social Worker

## 2011-11-25 ENCOUNTER — Ambulatory Visit (INDEPENDENT_AMBULATORY_CARE_PROVIDER_SITE_OTHER): Payer: BC Managed Care – PPO | Admitting: Licensed Clinical Social Worker

## 2011-11-25 DIAGNOSIS — F3132 Bipolar disorder, current episode depressed, moderate: Secondary | ICD-10-CM

## 2011-11-25 NOTE — Progress Notes (Signed)
   THERAPIST PROGRESS NOTE  Session Time: 11:30am-12:20pm  Participation Level: Active  Behavioral Response: Well GroomedAlertAnxious and Irritable  Type of Therapy: Individual Therapy  Treatment Goals addressed: Coping  Interventions: CBT, Motivational Interviewing and Supportive  Summary: Alex [name] is a 56 y.o. male who presents with anxious mood and irrtable affect. His anxiety is high today and he is irritable about the fact that his son confided in his wife instead of him regarding his recent admission that he has been intentionally hurting himself. He is upset that he is being left out and feels that it is intentional. He tries to reframe his own thinking by stating that he knows this is not about him, but about his son. He is angry that his wife does not communicate with him the way he wants and he continues to reflect upon the brokenness in the marriage, hoping for change in his wife. He has been going [place] the lake to fish and sail his boats to manage his stress.  His sleep and appetite are wnl.   Suicidal/Homicidal: Nowithout intent/plan  Therapist Response: Reviewed patients progress and assessed his current functioning. He is responding negatively to how his son choose to manage his psychiatric admission. Patient requires strong thought redirection and reframing continually throughout the session. He continues to avoid expanding his social network for fear of rejection and is resistant when redirected back to this topic. Used motivational interviewing to assist and encourage patient through the change process. Explored patients barriers to change. Used CBT to assist patient with the identification of his negative distortions and irrational thoughts. Encouraged patient to verbalize alternative and factual responses which challenge his distortions. Reviewed patients self care plan. Assessed his progress related to self care. Patient's self care is fair. Recommend proper diet,  regular exercise, socialization and recreation.   Plan: Return again in two weeks.  Diagnosis: Axis I: Bipolar, mixed    Axis II: No diagnosis    Syre Knerr, LCSW 11/25/2011

## 2011-11-27 ENCOUNTER — Encounter: Payer: Self-pay | Admitting: *Deleted

## 2011-12-03 ENCOUNTER — Other Ambulatory Visit (HOSPITAL_COMMUNITY): Payer: Self-pay

## 2011-12-03 DIAGNOSIS — F319 Bipolar disorder, unspecified: Secondary | ICD-10-CM

## 2011-12-03 MED ORDER — LORAZEPAM 1 MG PO TABS: 1.0000 mg | ORAL_TABLET | Freq: Every evening | ORAL | Status: DC | PRN

## 2011-12-03 MED ORDER — ZOLPIDEM TARTRATE 10 MG PO TABS: 10.0000 mg | ORAL_TABLET | Freq: Every evening | ORAL | Status: DC | PRN

## 2011-12-16 ENCOUNTER — Ambulatory Visit (INDEPENDENT_AMBULATORY_CARE_PROVIDER_SITE_OTHER): Payer: BC Managed Care – PPO | Admitting: Licensed Clinical Social Worker

## 2011-12-16 ENCOUNTER — Encounter (HOSPITAL_COMMUNITY): Payer: Self-pay | Admitting: Licensed Clinical Social Worker

## 2011-12-16 DIAGNOSIS — F3132 Bipolar disorder, current episode depressed, moderate: Secondary | ICD-10-CM

## 2011-12-16 NOTE — Progress Notes (Signed)
   THERAPIST PROGRESS NOTE  Session Time: 2:00pm-2:50pm  Participation Level: Active  Behavioral Response: Well GroomedAlertAnxious  Type of Therapy: Individual Therapy  Treatment Goals addressed: Coping  Interventions: CBT, Motivational Interviewing, Strength-based and Reframing  Summary: Alex [name] is a 56 y.o. male who presents with anxious mood and affect. He reports increased stress related to his children and is upset that his son had to have extensive surgery because he punctured himself with a paper clip again. He describes receving a phone call in the middle of the night from his son, telling him he could not retrieve the paper clip. Patient is very concerned over his son's behavior. He questions why his son is hurting himself. He is also angry with two of his daughters for making poor decisions. His daughter [name] will not be able to return to college next year because she did not receive the amount of financial aid needed because she is failing and did not take enough credits. Patient is angry over this and angry that neither he nor his wife followed through on the recommendation to take the car away from her. He blames his wife for not supporting him and enabling their daughter. He reports managing his anger well despite increased stress. His sleep and appetite are wnl.   Suicidal/Homicidal: Nowithout intent/plan  Therapist Response: Processed w/pt his feelings of anger, disappointment and concern regarding his children. Provided education to patient about self injurious behavior and borderline personality disorder, which his son has been diagnosed with. He demonstrates reluctance to accept his part in enabling his daughter and wants to blame his wife and her friend. Challenged patients avoidance of accountability. Discussed the importance of maintaining healthy boundaries and reviewed/practiced assertiveness techniques. Used motivational interviewing to assist and encourage  patient through the change process. Explored patients barriers to change. Used CBT to assist patient with the identification of his negative distortions and irrational thoughts. Encouraged patient to verbalize alternative and factual responses which challenge his distortions. Reviewed patients self care plan. Assessed his progress related to self care. Patient's self care is good. Recommend proper diet, regular exercise, socialization and recreation.    Plan: Return again in two weeks.  Diagnosis: Axis I: bi polar     Axis II: No diagnosis    Bubba Vanbenschoten, LCSW 12/16/2011

## 2011-12-27 ENCOUNTER — Ambulatory Visit
Admission: RE | Admit: 2011-12-27 | Discharge: 2011-12-27 | Disposition: A | Discharge status: Home / Self Care | Payer: BC Managed Care – PPO | Source: Ambulatory Visit | Attending: Orthopedic Surgery | Admitting: Orthopedic Surgery

## 2011-12-27 ENCOUNTER — Other Ambulatory Visit: Payer: Self-pay | Admitting: Orthopedic Surgery

## 2011-12-27 DIAGNOSIS — R52 Pain, unspecified: Secondary | ICD-10-CM

## 2011-12-30 ENCOUNTER — Encounter (HOSPITAL_COMMUNITY): Payer: Self-pay | Admitting: Licensed Clinical Social Worker

## 2011-12-30 ENCOUNTER — Ambulatory Visit (INDEPENDENT_AMBULATORY_CARE_PROVIDER_SITE_OTHER): Payer: BC Managed Care – PPO | Admitting: Licensed Clinical Social Worker

## 2011-12-30 DIAGNOSIS — F3132 Bipolar disorder, current episode depressed, moderate: Secondary | ICD-10-CM

## 2011-12-30 NOTE — Progress Notes (Signed)
   THERAPIST PROGRESS NOTE  Session Time: 1:00pm-1:50pm  Participation Level: Active  Behavioral Response: Well GroomedAlertAnxious and Depressed  Type of Therapy: Individual Therapy  Treatment Goals addressed: Anxiety and Coping  Interventions: CBT, Motivational Interviewing, Supportive and Reframing  Summary: Alex [name] is a 56 y.o. male who presents with anxious mood and affect. He reports an increase in depressive thoughts and endorses intrusive, fleeting thoughts of suicide. He endorses feeling trapped in his marriage and knows that leaving would cause even more financial problems for him. He does not endorse intent or a plan to harm himself. His family and his faith are both identified as protective factors. He denies any desire to harm himself and recognizes that acting upon these thoughts is not a solution. He feels overwhelmed by the stress in his family, primarily marital problems, health problems with his son and his daughters unsafe behavior. He does not feel supported by his wife and is "sounding the alarm" about his daughter and his wife is not listening to him. He is angry that his wife and his wife's friend have "created a monster" in his daughter. He endorses growing resentment towards his wife about this and will express anger towards his wife when his daughter behaves poorly. He is pleased that he has made progress regarding his expression of and management of his anger and anxiety. He has been going to the lake to calm down and has started to walk away from his wife when he feels he is going to say something he will regret. His sleep is disrupted and his appetite is wnl.   Suicidal/Homicidal: Nowithout intent/plan  Therapist Response: Reviewed patients progress and assessed his current functioning. Assessed patients safety and he is able to clearly communicate that his thoughts are fleeting and he denies any plan or intent to kill himself. Patient is able to identify  protective factors, family and faith. He demonstrates growing insight into the ineffectiveness of his coping strategies when he is angry and anxious and has been making behavioral adjustments. His thinking is clearer today and his thoughts are less distorted. Used CBT to assist patient with the identification of his negative distortions and irrational thoughts. Encouraged patient to verbalize alternative and factual responses which challenge his distortions. Patients sleep hygiene is poor. Educated patient on proper sleep hygiene. He remains resistant about developing male friends and wants to avoid exploration of this issue. Reviewed patients self care plan. Assessed his progress related to self care. Patient's self care is improving. Recommend proper diet, regular exercise, socialization and recreation.   Plan: Return again in two weeks.  Diagnosis: Axis I: Bipolar, mixed    Axis II: No diagnosis    Statia Burdick, LCSW 12/30/2011

## 2012-01-03 ENCOUNTER — Encounter: Payer: Self-pay | Admitting: Internal Medicine

## 2012-01-05 ENCOUNTER — Observation Stay (HOSPITAL_COMMUNITY): Payer: BC Managed Care – PPO

## 2012-01-05 ENCOUNTER — Encounter (HOSPITAL_COMMUNITY): Payer: Self-pay | Admitting: *Deleted

## 2012-01-05 ENCOUNTER — Observation Stay (HOSPITAL_COMMUNITY)
Admission: EM | Admit: 2012-01-05 | Discharge: 2012-01-06 | Disposition: A | Discharge status: Home / Self Care | Payer: BC Managed Care – PPO | Attending: Internal Medicine | Admitting: Internal Medicine

## 2012-01-05 ENCOUNTER — Emergency Department (HOSPITAL_COMMUNITY): Payer: BC Managed Care – PPO

## 2012-01-05 DIAGNOSIS — Z8589 Personal history of malignant neoplasm of other organs and systems: Secondary | ICD-10-CM | POA: Insufficient documentation

## 2012-01-05 DIAGNOSIS — F329 Major depressive disorder, single episode, unspecified: Secondary | ICD-10-CM

## 2012-01-05 DIAGNOSIS — R279 Unspecified lack of coordination: Secondary | ICD-10-CM | POA: Insufficient documentation

## 2012-01-05 DIAGNOSIS — R21 Rash and other nonspecific skin eruption: Secondary | ICD-10-CM

## 2012-01-05 DIAGNOSIS — C6960 Malignant neoplasm of unspecified orbit: Secondary | ICD-10-CM

## 2012-01-05 DIAGNOSIS — E291 Testicular hypofunction: Secondary | ICD-10-CM

## 2012-01-05 DIAGNOSIS — R42 Dizziness and giddiness: Secondary | ICD-10-CM

## 2012-01-05 DIAGNOSIS — R059 Cough, unspecified: Secondary | ICD-10-CM

## 2012-01-05 DIAGNOSIS — K222 Esophageal obstruction: Secondary | ICD-10-CM

## 2012-01-05 DIAGNOSIS — M1990 Unspecified osteoarthritis, unspecified site: Secondary | ICD-10-CM

## 2012-01-05 DIAGNOSIS — R198 Other specified symptoms and signs involving the digestive system and abdomen: Secondary | ICD-10-CM

## 2012-01-05 DIAGNOSIS — J4489 Other specified chronic obstructive pulmonary disease: Secondary | ICD-10-CM

## 2012-01-05 DIAGNOSIS — J019 Acute sinusitis, unspecified: Secondary | ICD-10-CM

## 2012-01-05 DIAGNOSIS — M159 Polyosteoarthritis, unspecified: Secondary | ICD-10-CM

## 2012-01-05 DIAGNOSIS — I10 Essential (primary) hypertension: Secondary | ICD-10-CM

## 2012-01-05 DIAGNOSIS — N453 Epididymo-orchitis: Secondary | ICD-10-CM

## 2012-01-05 DIAGNOSIS — R5383 Other fatigue: Secondary | ICD-10-CM

## 2012-01-05 DIAGNOSIS — M545 Low back pain, unspecified: Secondary | ICD-10-CM

## 2012-01-05 DIAGNOSIS — E785 Hyperlipidemia, unspecified: Secondary | ICD-10-CM

## 2012-01-05 DIAGNOSIS — R5381 Other malaise: Secondary | ICD-10-CM

## 2012-01-05 DIAGNOSIS — F319 Bipolar disorder, unspecified: Secondary | ICD-10-CM | POA: Insufficient documentation

## 2012-01-05 DIAGNOSIS — M79609 Pain in unspecified limb: Secondary | ICD-10-CM

## 2012-01-05 DIAGNOSIS — F3289 Other specified depressive episodes: Secondary | ICD-10-CM

## 2012-01-05 DIAGNOSIS — Z9089 Acquired absence of other organs: Secondary | ICD-10-CM

## 2012-01-05 DIAGNOSIS — F3132 Bipolar disorder, current episode depressed, moderate: Secondary | ICD-10-CM

## 2012-01-05 DIAGNOSIS — H538 Other visual disturbances: Secondary | ICD-10-CM | POA: Insufficient documentation

## 2012-01-05 DIAGNOSIS — D839 Common variable immunodeficiency, unspecified: Secondary | ICD-10-CM | POA: Diagnosis present

## 2012-01-05 DIAGNOSIS — I2510 Atherosclerotic heart disease of native coronary artery without angina pectoris: Secondary | ICD-10-CM | POA: Diagnosis present

## 2012-01-05 DIAGNOSIS — Z85840 Personal history of malignant neoplasm of eye: Secondary | ICD-10-CM | POA: Insufficient documentation

## 2012-01-05 DIAGNOSIS — F411 Generalized anxiety disorder: Secondary | ICD-10-CM

## 2012-01-05 DIAGNOSIS — G459 Transient cerebral ischemic attack, unspecified: Secondary | ICD-10-CM

## 2012-01-05 DIAGNOSIS — C73 Malignant neoplasm of thyroid gland: Secondary | ICD-10-CM

## 2012-01-05 DIAGNOSIS — J329 Chronic sinusitis, unspecified: Secondary | ICD-10-CM

## 2012-01-05 DIAGNOSIS — J449 Chronic obstructive pulmonary disease, unspecified: Secondary | ICD-10-CM

## 2012-01-05 DIAGNOSIS — R270 Ataxia, unspecified: Secondary | ICD-10-CM | POA: Diagnosis present

## 2012-01-05 DIAGNOSIS — R269 Unspecified abnormalities of gait and mobility: Secondary | ICD-10-CM

## 2012-01-05 DIAGNOSIS — R05 Cough: Secondary | ICD-10-CM

## 2012-01-05 DIAGNOSIS — R7309 Other abnormal glucose: Secondary | ICD-10-CM

## 2012-01-05 LAB — DIFFERENTIAL
Basophils Absolute: 0.1 10*3/uL (ref 0.0–0.1)
Basophils Relative: 1 % (ref 0–1)
Eosinophils Absolute: 0.2 10*3/uL (ref 0.0–0.7)
Eosinophils Relative: 1 % (ref 0–5)
Lymphocytes Relative: 25 % (ref 12–46)
Lymphs Abs: 2.5 10*3/uL (ref 0.7–4.0)
Monocytes Absolute: 1.2 10*3/uL — ABNORMAL HIGH (ref 0.1–1.0)
Monocytes Relative: 11 % (ref 3–12)
Neutro Abs: 6.5 10*3/uL (ref 1.7–7.7)
Neutrophils Relative %: 62 % (ref 43–77)

## 2012-01-05 LAB — HEMOGLOBIN A1C
Hgb A1c MFr Bld: 5.9 % — ABNORMAL HIGH (ref <5.7)
Mean Plasma Glucose: 123 mg/dL — ABNORMAL HIGH (ref <117)

## 2012-01-05 LAB — BASIC METABOLIC PANEL
BUN: 15 mg/dL (ref 6–23)
CO2: 27 mEq/L (ref 19–32)
Calcium: 9 mg/dL (ref 8.4–10.5)
Chloride: 98 mEq/L (ref 96–112)
Creatinine, Ser: 1.02 mg/dL (ref 0.50–1.35)
GFR calc Af Amer: >90 mL/min (ref >90)
GFR calc non Af Amer: 79 mL/min — ABNORMAL LOW (ref >90)
Glucose, Bld: 107 mg/dL — ABNORMAL HIGH (ref 70–99)
Potassium: 3.9 mEq/L (ref 3.5–5.1)
Sodium: 134 mEq/L — ABNORMAL LOW (ref 135–145)

## 2012-01-05 LAB — URINALYSIS, ROUTINE W REFLEX MICROSCOPIC
Bilirubin Urine: NEGATIVE
Glucose, UA: NEGATIVE mg/dL
Hgb urine dipstick: NEGATIVE
Ketones, ur: NEGATIVE mg/dL
Leukocytes, UA: NEGATIVE
Nitrite: NEGATIVE
Protein, ur: NEGATIVE mg/dL
Specific Gravity, Urine: 1.014 (ref 1.005–1.030)
Urobilinogen, UA: 0.2 mg/dL (ref 0.0–1.0)
pH: 7.5 (ref 5.0–8.0)

## 2012-01-05 LAB — CBC
HCT: 45 % (ref 39.0–52.0)
Hemoglobin: 15.7 g/dL (ref 13.0–17.0)
MCH: 28.1 pg (ref 26.0–34.0)
MCHC: 34.9 g/dL (ref 30.0–36.0)
MCV: 80.6 fL (ref 78.0–100.0)
Platelets: 165 10*3/uL (ref 150–400)
RBC: 5.58 MIL/uL (ref 4.22–5.81)
RDW: 15.2 % (ref 11.5–15.5)
WBC: 10.4 10*3/uL (ref 4.0–10.5)

## 2012-01-05 LAB — GLUCOSE, CAPILLARY
Glucose-Capillary: 106 mg/dL — ABNORMAL HIGH (ref 70–99)
Glucose-Capillary: 107 mg/dL — ABNORMAL HIGH (ref 70–99)
Glucose-Capillary: 121 mg/dL — ABNORMAL HIGH (ref 70–99)

## 2012-01-05 LAB — CARDIAC PANEL(CRET KIN+CKTOT+MB+TROPI)
CK, MB: 5.2 ng/mL — ABNORMAL HIGH (ref 0.3–4.0)
Relative Index: 2 (ref 0.0–2.5)
Total CK: 261 U/L — ABNORMAL HIGH (ref 7–232)
Troponin I: <0.3 ng/mL (ref <0.30)

## 2012-01-05 MED ORDER — ATORVASTATIN CALCIUM 40 MG PO TABS
40.0000 mg | ORAL_TABLET | Freq: Every day | ORAL | Status: DC
  Administered 2012-01-05: 40 mg via ORAL
  Filled 2012-01-05 (×2): qty 1

## 2012-01-05 MED ORDER — GI COCKTAIL ~~LOC~~
30.0000 mL | Freq: Once | ORAL | Status: AC
  Administered 2012-01-05: 30 mL via ORAL
  Filled 2012-01-05: qty 30

## 2012-01-05 MED ORDER — HYDRALAZINE HCL 20 MG/ML IJ SOLN
5.0000 mg | Freq: Four times a day (QID) | INTRAMUSCULAR | Status: DC | PRN
  Administered 2012-01-05: 5 mg via INTRAVENOUS
  Filled 2012-01-05 (×2): qty 0.25

## 2012-01-05 MED ORDER — GUAIFENESIN ER 600 MG PO TB12
600.0000 mg | ORAL_TABLET | Freq: Two times a day (BID) | ORAL | Status: DC | PRN
  Filled 2012-01-05: qty 1

## 2012-01-05 MED ORDER — LORAZEPAM 0.5 MG PO TABS
0.5000 mg | ORAL_TABLET | Freq: Every day | ORAL | Status: DC
Start: 2012-01-05 — End: 2012-01-06
  Administered 2012-01-05 (×2): 0.5 mg via ORAL
  Filled 2012-01-05 (×2): qty 1

## 2012-01-05 MED ORDER — AMLODIPINE BESYLATE 5 MG PO TABS
5.0000 mg | ORAL_TABLET | Freq: Every day | ORAL | Status: DC
  Administered 2012-01-05 – 2012-01-06 (×2): 5 mg via ORAL
  Filled 2012-01-05 (×2): qty 1

## 2012-01-05 MED ORDER — IRBESARTAN 300 MG PO TABS
300.0000 mg | ORAL_TABLET | Freq: Every day | ORAL | Status: DC
  Administered 2012-01-05 – 2012-01-06 (×2): 300 mg via ORAL
  Filled 2012-01-05 (×2): qty 1

## 2012-01-05 MED ORDER — AMOXICILLIN-POT CLAVULANATE 875-125 MG PO TABS
1.0000 | ORAL_TABLET | Freq: Two times a day (BID) | ORAL | Status: DC
  Administered 2012-01-05: 1 via ORAL
  Filled 2012-01-05 (×2): qty 1

## 2012-01-05 MED ORDER — POLYVINYL ALCOHOL 1.4 % OP SOLN
1.0000 [drp] | Freq: Four times a day (QID) | OPHTHALMIC | Status: DC | PRN
  Filled 2012-01-05: qty 15

## 2012-01-05 MED ORDER — PANTOPRAZOLE SODIUM 40 MG PO TBEC
40.0000 mg | DELAYED_RELEASE_TABLET | Freq: Every day | ORAL | Status: DC
  Administered 2012-01-05 – 2012-01-06 (×2): 40 mg via ORAL

## 2012-01-05 MED ORDER — ASPIRIN 325 MG PO TABS
325.0000 mg | ORAL_TABLET | Freq: Every day | ORAL | Status: DC
  Administered 2012-01-05 – 2012-01-06 (×2): 325 mg via ORAL
  Filled 2012-01-05 (×2): qty 1

## 2012-01-05 MED ORDER — LEVOTHYROXINE SODIUM 150 MCG PO TABS
150.0000 ug | ORAL_TABLET | Freq: Every day | ORAL | Status: DC
  Administered 2012-01-05: 150 ug via ORAL
  Filled 2012-01-05 (×2): qty 1

## 2012-01-05 MED ORDER — GADOBENATE DIMEGLUMINE 529 MG/ML IV SOLN
15.0000 mL | Freq: Once | INTRAVENOUS | Status: AC | PRN
  Administered 2012-01-05: 15 mL via INTRAVENOUS

## 2012-01-05 MED ORDER — MOXIFLOXACIN HCL 400 MG PO TABS
400.0000 mg | ORAL_TABLET | Freq: Every day | ORAL | Status: DC
  Administered 2012-01-05: 400 mg via ORAL
  Filled 2012-01-05 (×2): qty 1

## 2012-01-05 MED ORDER — SODIUM CHLORIDE 0.9 % IV SOLN: INTRAVENOUS | Status: DC

## 2012-01-05 MED ORDER — TESTOSTERONE 50 MG/5GM (1%) TD GEL
5.0000 g | Freq: Every day | TRANSDERMAL | Status: DC
  Administered 2012-01-05 – 2012-01-06 (×2): 5 g via TRANSDERMAL
  Filled 2012-01-05 (×2): qty 5

## 2012-01-05 MED ORDER — LAMOTRIGINE 150 MG PO TABS
300.0000 mg | ORAL_TABLET | Freq: Every day | ORAL | Status: DC
  Administered 2012-01-05 – 2012-01-06 (×2): 300 mg via ORAL
  Filled 2012-01-05 (×2): qty 2

## 2012-01-05 MED ORDER — ACETAMINOPHEN 325 MG PO TABS
650.0000 mg | ORAL_TABLET | ORAL | Status: DC | PRN
  Administered 2012-01-05 – 2012-01-06 (×2): 650 mg via ORAL
  Filled 2012-01-05 (×3): qty 2

## 2012-01-05 MED ORDER — SODIUM CHLORIDE 0.9 % IV SOLN
INTRAVENOUS | Status: DC
  Administered 2012-01-05 (×2): via INTRAVENOUS

## 2012-01-05 NOTE — ED Provider Notes (Addendum)
History     CSN: [id number]  Arrival date & time 01/05/12  0105   First MD Initiated Contact with Patient 01/05/12 0110      Chief Complaint  Patient presents with  . Near Syncope    (Consider location/radiation/quality/duration/timing/severity/associated sxs/prior treatment) The history is provided by the patient and the EMS personnel. The history is limited by the condition of the patient.  The pt is a 59 y male with hx of immune globulin deficiency, cancer 8 yrs ago ( in remission ), and htn.   He denies cad, smoking, hx ami or stroke.  He was watching tv.  He had sudden onset of the image "shifting" along with dizziness. He denies pain anywhere.  He denies n/v/weakness.  He says he cannot stand up on his own or walk by himself.   He has been on amox for sinusitis.  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 Level 5 caveat applies for urgent need for intervention/evaluation.     Past Medical History  Diagnosis Date  . Bipolar disorder   . Anxiety   . Cancer     adenosquamous of eye and neck  . Depression   . Thyroid disease     thyroid cancer  . Hypertension   . Hyperlipidemia   . Nephrolithiasis   . Immune deficiency disorder   . GERD (gastroesophageal reflux disease)   . BPH (benign prostatic hyperplasia)     Past Surgical History  Procedure Date  . Orthopedic surgery   . Hernia repair   . Thyroidectomy   . Occuloplastic surgery   . Ankle surgery   . Shoulder surgery     Family History  Problem Relation Age of Onset  . Depression Sister   . Depression Daughter   . Drug abuse Daughter     History  Substance Use Topics  . Smoking status: Never Smoker   . Smokeless tobacco: Not on file  . Alcohol Use: No      Review of Systems  HENT: Negative for ear pain and neck pain.   Eyes: Positive for visual disturbance.  Respiratory: Negative for chest tightness.   Cardiovascular: Negative for chest pain and  leg swelling.  Gastrointestinal: Negative for nausea and vomiting.  Skin: Negative for rash.  Neurological: Positive for dizziness. Negative for speech difficulty and headaches.  Psychiatric/Behavioral: Negative for confusion.  All other systems reviewed and are negative.    Allergies  Review of patient's allergies indicates no known allergies.  Home Medications   Current Outpatient Rx  Name Route Sig Dispense Refill  . AMOXICILLIN-POT CLAVULANATE 875-125 MG PO TABS Oral Take 1 tablet by mouth Twice daily.    . ASPIRIN EC 81 MG PO TBEC Oral Take 1 tablet (81 mg total) by mouth daily. 30 tablet 11  . ATORVASTATIN CALCIUM 40 MG PO TABS Oral Take 40 mg by mouth at bedtime.    [name] MICARDIS 80 MG PO TABS  TAKE 1 TABLET BY MOUTH ONCE A DAY 90 tablet 2  . RABEPRAZOLE SODIUM 20 MG PO TBEC Oral Take 20 mg by mouth at bedtime.    [name] AMLODIPINE BESYLATE 5 MG PO TABS Oral Take 1 tablet (5 mg total) by mouth daily. 90 tablet 3  . IMMUNE GLOBULIN (HUMAN) 4 GM/20ML [place] SOLN Subcutaneous Inject into the skin once a week.      [name] LAMOTRIGINE 150 MG PO TABS Oral Take 2 tablets (  300 mg total) by mouth daily. 180 tablet 0    Pharmacy will d/c order for "Lamictal 150mg   once  ...  . LORAZEPAM 1 MG PO TABS Oral Take 1 tablet (1 mg total) by mouth at bedtime as needed for anxiety. 30 tablet 0  . LOTEMAX 0.5 % OP SUSP      . MEPERIDINE HCL 50 MG PO TABS      . NAPROXEN 500 MG PO TABS Oral Take 500 mg by mouth daily as needed.      . NON FORMULARY  Various pain meds     . OXYCODONE-ACETAMINOPHEN 10-325 MG PO TABS      . PRAVASTATIN SODIUM 40 MG PO TABS      . QVAR 40 MCG/ACT IN AERS      . SYNTHROID 150 MCG PO TABS Oral Take 1 tablet (150 mcg total) by mouth daily. 90 tablet 3    Dispense as written.    Needs OV for future refills.  [name] TADALAFIL 5 MG PO TABS Oral Take 1 tablet (5 mg total) by mouth daily as needed for erectile dysfunction. 30 tablet 11  . TERBINAFINE HCL 250 MG PO TABS      . TESTOSTERONE 50  MG/5GM TD GEL Transdermal Place 5 g onto the skin daily.      . TRAMADOL HCL 50 MG PO TABS      . VENTOLIN HFA 108 (90 BASE) MCG/ACT IN AERS      . VOLTAREN 1 % TD GEL      . ZOLPIDEM TARTRATE 10 MG PO TABS Oral Take 1 tablet (10 mg total) by mouth at bedtime as needed for sleep. 30 tablet 0    BP 178/122  Pulse 86  Temp(Src) 98.4 F (36.9 C) (Oral)  Resp 18  SpO2 100%  Physical Exam  Vitals reviewed. Constitutional: He is oriented to person, place, and time. He appears well-developed and well-nourished. No distress.  HENT:  Head: Normocephalic and atraumatic.  Right Ear: External ear normal.  Left Ear: External ear normal.  Eyes: Conjunctivae and EOM are normal. Pupils are equal, round, and reactive to light.  Neck: Normal range of motion. Neck supple.       No carotid bruits  Cardiovascular: Normal rate.   No murmur heard. Pulmonary/Chest: Effort normal and breath sounds normal.  Abdominal: Soft. Bowel sounds are normal. He exhibits no distension.  Musculoskeletal: Normal range of motion. He exhibits no edema.  Neurological: He is alert and oriented to person, place, and time. No cranial nerve deficit. Coordination normal.       When pt stands, up he is unsteady.   When he closes his eyes, he falls to the side.  Skin: Skin is warm and dry.  Psychiatric: He has a normal mood and affect. Thought content normal.    ED Course  Procedures (including critical care time) Sudden ataxia. No pain, n/v. Neuro exam normal except ataxia, falls to side with standing.     Labs Reviewed  CBC  DIFFERENTIAL  BASIC METABOLIC PANEL  URINALYSIS, ROUTINE W REFLEX MICROSCOPIC   No results found.   No diagnosis found.  ECG Normal sinus rhythm with heart rate 84. Normal axis. Nonspecific intraventricular conduction delay with QRS 114 ms. Normal.  ST and T waves. Normal sinus rhythm with nonspecific intraventricular conduction dilay  2:50 AM I spoke with Dr. Thad Ranger.  She agree  with MRI but did not think it needed to be done immediately because NIH stroke scale =  0 and she does not believe pt needs acute intervention. She will consult on pt.   MDM  Sudden ataxia        Cheri Guppy, MD 01/05/12 0149  Cheri Guppy, MD 01/05/12 4540  Cheri Guppy, MD 01/05/12 [phone]

## 2012-01-05 NOTE — Progress Notes (Signed)
[facility] PROGRESS NOTE  Aj Crunkleton Galyean ZOX:[id number] DOB: 1955/11/30 DOA: 01/05/2012 PCP: Sonda Primes, MD, MD  Assessment/Plan: 1. Vertigo - suspect due to labyrinthitis (vestibular neuronitis) - patient reluctant to start prednisone . Will d/w with his primary ENT MD Dr. Annalee Genta to arrange for f/u.  2. Sinusitis - patient has already taken 5 days of augmentin without much relief. Fluid in the sphenoid sinus observed at MRI - change abx to avelox.  3. CAD - asymptomatic  Principal Problem:  *Vertigo Active Problems:  Common variable immunodeficiency  HYPERTENSION, BENIGN  CORONARY ATHEROSCLEROSIS  Bipolar 1 disorder, depressed, moderate  Ataxia  Code Status: full  Jaris Kohles, MD  [facility] Pager [phone]  If 7PM-7AM, please contact night-coverage [email or web address] Password [email or web address] 01/05/2012, 3:41 PM   LOS: 0 days     Consultants:  Neuro  Procedures:  MRI  Antibiotics:  avelox 5/26  HPI/Subjective: Remains with severe vertigo unable to ambulate  Objective: Filed Vitals:   01/05/12 0500 01/05/12 0600 01/05/12 1000 01/05/12 1400  BP: 168/115 168/114 153/103 144/95  Pulse: 67 65 85 83  Temp:  98.2 F (36.8 C) 97.6 F (36.4 C) 97.8 F (36.6 C)  TempSrc:  Oral Oral Oral  Resp: 16 17 18 18   Height:  6\' 1"  (1.854 m)    Weight:  75.7 kg (166 lb 14.2 oz)    SpO2: 97% 98% 96% 97%    Intake/Output Summary (Last 24 hours) at 01/05/12 1541 Last data filed at 01/05/12 0523  Gross per 24 hour  Intake      0 ml  Output    800 ml  Net   -800 ml    Exam:   General:  Alert and oriented x3  Cardiovascular: RRR  Respiratory: CTAB  Abdomen: soft, NT  Data Reviewed: Basic Metabolic Panel:  Lab 01/05/12 8295  NA 134*  K 3.9  CL 98  CO2 27  GLUCOSE 107*  BUN 15  CREATININE 1.02  CALCIUM 9.0  MG --  PHOS --   Liver Function Tests: No results found for this basename: AST:5,ALT:5,ALKPHOS:5,BILITOT:5,PROT:5,ALBUMIN:5 in  the last 168 hours No results found for this basename: LIPASE:5,AMYLASE:5 in the last 168 hours No results found for this basename: AMMONIA:5 in the last 168 hours CBC:  Lab 01/05/12 0131  WBC 10.4  NEUTROABS 6.5  HGB 15.7  HCT 45.0  MCV 80.6  PLT 165   Cardiac Enzymes:  Lab 01/05/12 0620  CKTOTAL 261*  CKMB 5.2*  CKMBINDEX --  TROPONINI <0.30   BNP (last 3 results) No results found for this basename: PROBNP:3 in the last 8760 hours CBG:  Lab 01/05/12 1126 01/05/12 0646  GLUCAP 107* 106*    No results found for this or any previous visit (from the past 240 hour(s)).   Studies: Ct Head Wo Contrast  01/05/2012  *RADIOLOGY REPORT*  Clinical Data: Sudden onset of dizziness and off balance.  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  CT HEAD WITHOUT CONTRAST  Technique:  Contiguous axial images were obtained from the base of the skull through the vertex without contrast.  Comparison: 10/09/2007  Findings: Mild cerebral atrophy.  Mild ventricular dilatation consistent with central atrophy.  No mass effect or midline shift. No abnormal extra-axial fluid collections.  Gray-white matter junctions are distinct.  Basal cisterns are not effaced.  No evidence of acute intracranial hemorrhage.  No focal mass or edema to suggest metastasis.  If clinically indicated, MRI would be more sensitive for  detection of metastatic disease in the intracranial contents.  No depressed skull fractures.  Membrane thickening or polyps in the sphenoid sinuses.  Mastoid air cells are not opacified. There appears to be fragmentation of the left mandibular head which might represent old fracture deformity or hypertrophic change.  No significant changes since the previous study.  IMPRESSION: No acute intracranial abnormalities.  Original Report Authenticated By: Marlon Pel, [name]   Mr [name] ZO Contrast  01/05/2012  *RADIOLOGY REPORT*  Clinical Data:  Dizziness.  Vertigo.  History of  cancer.  MRI HEAD WITHOUT AND WITH CONTRAST MRA HEAD WITHOUT CONTRAST  Technique:  Multiplanar, multiecho pulse sequences of the brain and surrounding structures were obtained without and with intravenous contrast.  Angiographic images of the head were obtained using MRA technique without contrast.  Contrast: 15mL MULTIHANCE GADOBENATE DIMEGLUMINE 529 MG/ML IV SOLN  Comparison:  Head CT same day.  MRI 06/27/2008.  MRI HEAD WITHOUT AND WITH CONTRAST  Findings:  Diffusion imaging does not show any acute or subacute infarction.  The brainstem and cerebellum are normal.  The cerebral hemispheres are normal except for mild chronic small vessel changes of the deep white matter.  No cortical or large vessel territory infarction.  No mass lesion, hemorrhage, hydrocephalus or extra- axial collection.  After contrast administration, there is no abnormal enhancement.  Major vessels at the base of the brain are patent.  No pituitary mass.  There is mild mucosal inflammation of the sphenoid sinus.  No skull or skull base lesion is seen.  The study was not done in the form of a detailed orbital exam, but no orbital lesion is seen.  IMPRESSION: No acute or significant finding.  Minimal small vessel change of the hemispheric deep white matter.  Mild mucosal inflammation of the sphenoid sinus.  MRA HEAD  Findings: Both internal carotid arteries are widely patent into the brain.  The anterior and middle cerebral vessels are patent without proximal stenosis, aneurysm or vascular malformation.  Both vertebral arteries are widely patent to the basilar.  No basilar stenosis.  Posterior circulation branch vessels appear normal. There is fetal origin of the right posterior cerebral artery.  IMPRESSION: Normal intracranial MR angiography of the large and medium-sized vessels.  Original Report Authenticated By: Thomasenia Sales, [name]   Dg Hand 2 View Left  12/27/2011  *RADIOLOGY REPORT*  Clinical Data: Left hand pain.  Pain between the  second third and third and fourth digits.  LEFT HAND - 2 VIEW  Comparison: None.  Findings: Anatomic alignment bones of the hand.  No acute fracture is identified.  Small dystrophic calcification is present adjacent to the base of the fifth metacarpal.  Destructive changes of the small finger PIP joint. Loss of joint space and marginal osteophyte suggesting a chronic destructive process or a remote injury. This may be post-traumatic, associated with inflammatory arthritis or chronic or remote septic arthritis.  Mild first MCP joint osteoarthritis.  IMPRESSION:  1.  No acute osseous abnormality. 2.  Destructive changes of the left small finger PIP joint. Differential considerations above.  The appearance is nonspecific.  Original Report Authenticated By: Andreas Newport, [name]   Mr Mra Head/brain Wo Cm  01/05/2012  *RADIOLOGY REPORT*  Clinical Data:  Dizziness.  Vertigo.  History of cancer.  MRI HEAD WITHOUT AND WITH CONTRAST MRA HEAD WITHOUT CONTRAST  Technique:  Multiplanar, multiecho pulse sequences of the brain and surrounding structures were obtained without and with intravenous contrast.  Angiographic images of  the head were obtained using MRA technique without contrast.  Contrast: 15mL MULTIHANCE GADOBENATE DIMEGLUMINE 529 MG/ML IV SOLN  Comparison:  Head CT same day.  MRI 06/27/2008.  MRI HEAD WITHOUT AND WITH CONTRAST  Findings:  Diffusion imaging does not show any acute or subacute infarction.  The brainstem and cerebellum are normal.  The cerebral hemispheres are normal except for mild chronic small vessel changes of the deep white matter.  No cortical or large vessel territory infarction.  No mass lesion, hemorrhage, hydrocephalus or extra- axial collection.  After contrast administration, there is no abnormal enhancement.  Major vessels at the base of the brain are patent.  No pituitary mass.  There is mild mucosal inflammation of the sphenoid sinus.  No skull or skull base lesion is seen.  The study  was not done in the form of a detailed orbital exam, but no orbital lesion is seen.  IMPRESSION: No acute or significant finding.  Minimal small vessel change of the hemispheric deep white matter.  Mild mucosal inflammation of the sphenoid sinus.  MRA HEAD  Findings: Both internal carotid arteries are widely patent into the brain.  The anterior and middle cerebral vessels are patent without proximal stenosis, aneurysm or vascular malformation.  Both vertebral arteries are widely patent to the basilar.  No basilar stenosis.  Posterior circulation branch vessels appear normal. There is fetal origin of the right posterior cerebral artery.  IMPRESSION: Normal intracranial MR angiography of the large and medium-sized vessels.  Original Report Authenticated By: Thomasenia Sales, [name]    Scheduled Meds:    . amLODipine  5 mg Oral Daily  . aspirin  325 mg Oral Daily  . atorvastatin  40 mg Oral QHS  . gi cocktail  30 mL Oral Once  . irbesartan  300 mg Oral Daily  . lamoTRIgine  300 mg Oral Daily  . levothyroxine  150 mcg Oral QHS  . LORazepam  0.5 mg Oral QHS  . moxifloxacin  400 mg Oral q1800  . pantoprazole  40 mg Oral Q1200  . testosterone  5 g Transdermal Daily  . DISCONTD: amoxicillin-clavulanate  1 tablet Oral Q12H   Continuous Infusions:    . DISCONTD: sodium chloride    . DISCONTD: sodium chloride 75 mL/hr at 01/05/12 1309

## 2012-01-05 NOTE — ED Notes (Signed)
EMS BP 180/120 Heart rate 88, Resp 16  99% on RA CBG 106

## 2012-01-05 NOTE — Evaluation (Signed)
Physical Therapy Evaluation Patient Details Name: Alex [name] MRN: [id number] DOB: [birth year] Today's Date: 01/05/2012 Time: 4098-1191 PT Time Calculation (min): 65 min  PT Assessment / Plan / Recommendation Clinical Impression  pt presents with Vertigo sx and notes recent Sinusitis.  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  pt also describes 3 weeks of feeling an "electrical tingle" in his R Occipital Area and associated a low pitched noise in his R ear that often occured with this "tingle".  pt has dysconjugate R eye movement, but question how much of this is baseline secondary to Ca treats.  pt demos R beating Nystagmus during bed mobility, but deferred further testing to Vestibular Specialist tomorrow secondary to pt's extensive history and await further Neurology work-up.  Will follow and would benefit from CIR at D/C to continue Vestibular Rehab.      PT Assessment  Patient needs continued PT services    Follow Up Recommendations  Inpatient Rehab    Barriers to Discharge Other (comment) pt notes not having good support at home and seems to have a lot going on with wife and children.  Requested SW consult.      lEquipment Recommendations  Defer to next venue    Recommendations for Other Services Rehab consult (SW Consult)   Frequency Min 3X/week    Precautions / Restrictions Precautions Precautions: Fall Restrictions Weight Bearing Restrictions: No   Pertinent Vitals/Pain No c/o      Mobility  Bed Mobility Bed Mobility: Rolling Left;Left Sidelying to Sit;Sitting - Scoot to [place] of Bed;Sit to Sidelying Left Rolling Left: 4: Min guard Left Sidelying to Sit: 4: Min assist Sitting - Scoot to [place] of Bed: 4: Min guard Sit to Sidelying Left: 4: Min guard Details for Bed Mobility Assistance: pt  initially tries to just sit up and gets severe spinning feeling with Nystagmous and notes feels like he is being pushed L back down to bed.  Returned to sitting with max cueing for gaze stabilization and pt notes only mild spinning.  Utilized gaze stabilization with return to bed.   Transfers Transfers: Not assessed Ambulation/Gait Ambulation/Gait Assistance: Not tested (comment) Stairs: No Wheelchair Mobility Wheelchair Mobility: No    Exercises Other Exercises Other Exercises: Performed x1 exercises and instructed on use of gaze stabilization with all mobilty and when pt being transferred down for MRI.     PT Diagnosis: Difficulty walking (Vestibular Symptoms)  PT Problem List: Decreased activity tolerance;Decreased balance;Decreased mobility;Decreased knowledge of use of DME (Vestibular testing) PT Treatment Interventions: DME instruction;Gait training;Stair training;Functional mobility training;Therapeutic activities;Therapeutic exercise;Balance training;Neuromuscular re-education;Patient/family education (Vestibular Treatments)   PT Goals Acute Rehab PT Goals PT Goal Formulation: With patient Time For Goal Achievement: 01/19/12 Potential to Achieve Goals: Good Pt will Roll Supine to Right Side: with modified independence PT Goal: Rolling Supine to Right Side - Progress: Goal set today Pt will Roll Supine to Left Side: with modified independence PT Goal: Rolling Supine to Left Side - Progress: Goal set today Pt will go Supine/Side to Sit: with modified independence PT Goal: Supine/Side to Sit - Progress: Goal set today Pt will go Sit to Supine/Side: with modified independence PT Goal: Sit to Supine/Side - Progress: Goal set today Pt will go Sit to Stand: with modified independence PT Goal: Sit to Stand - Progress: Goal  set today Pt will go Stand to Sit: with modified independence PT Goal: Stand to Sit - Progress: Goal set today Pt will Ambulate: >150 feet;with modified  independence;with least restrictive assistive device PT Goal: Ambulate - Progress: Goal set today Pt will Go Up / Down Stairs: Flight;with supervision;with rail(s) PT Goal: Up/Down Stairs - Progress: Goal set today  Visit Information  Last PT Received On: 01/05/12 Assistance Needed: +2 (OOB)    Subjective Data  Subjective: Even with all the Cancer I've had I've never had any dizziness like this.   Patient Stated Goal: Back to normal   Prior Functioning  Home Living Lives With: Spouse;Daughter Available Help at Discharge: Family Type of Home: House Home Access: Stairs to enter Secretary/administrator of Steps: 3 Entrance Stairs-Rails: Right;Left Home Layout: Two level;1/2 bath on main level;Bed/bath upstairs Alternate Level Stairs-Number of Steps: flight Alternate Level Stairs-Rails: Right Bathroom Shower/Tub: Health visitor: Standard Bathroom Accessibility: Yes How Accessible: Accessible via walker Home Adaptive Equipment: None Additional Comments: pt notes wife not very supportive at home and has one child with multiple medical problems and another with drug and Etoh problems causing a lot of diffilculties at home.  Requested SW Consult to A pt.   Prior Function Level of Independence: Independent Able to Take Stairs?: Yes Driving: Yes Vocation: On disability Communication Communication: No difficulties    Cognition  Overall Cognitive Status: Appears within functional limits for tasks assessed/performed Arousal/Alertness: Awake/alert Orientation Level: Oriented X4 / Intact Behavior During Session: [facility] for tasks performed    Extremity/Trunk Assessment Right Lower Extremity Assessment RLE ROM/Strength/Tone: WFL for tasks assessed RLE Sensation: WFL - Light Touch Left Lower Extremity Assessment LLE ROM/Strength/Tone: WFL for tasks assessed LLE Sensation: WFL - Light Touch   Balance Balance Balance Assessed: No  End of Session PT - End of  Session Equipment Utilized During Treatment:  (None) Activity Tolerance:  (Limited by Vertigo Symptoms) Patient left: in bed;with call bell/phone within reach;with bed alarm set Nurse Communication: Mobility status (Need for SW Consult)   Sunny Schlein, [place] [phone] 01/05/2012, 10:16 AM

## 2012-01-05 NOTE — H&P (Signed)
PCP:   Sonda Primes, MD, MD    Chief Complaint:   vertigo  HPI: Alex [name] is a 56 y.o. male   has a past medical history of Bipolar disorder; Anxiety; Cancer; Depression; Thyroid disease; Hypertension; Hyperlipidemia; Nephrolithiasis; Immune deficiency disorder; GERD (gastroesophageal reflux disease); and BPH (benign prostatic hyperplasia).   Presented with  Vertigo first episode this am lasted only 2 min and went away. Started to watch TV around 10pm suddenly he had blurred vison and shift in his vision. Initially he wanted to call 911 but his family comfort and and he waited until the end of a movie.  When he tried to stand up around 12 he could not he was falling to the left. He called 911. He has chronic tenitis but now it is increased. No headache. He has had sinus infection for the past few weeks and have been taking augmentin. 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 He also has history of immune deficiency for which she takes immunoglobulin  Review of Systems:    Pertinent positives include: nasal congestion,gait abnormality,   Constitutional:  No weight loss, night sweats, Fevers, chills, fatigue, weight loss  HEENT:  No headaches, Difficulty swallowing,Tooth/dental problems,Sore throat,  No sneezing, itching, ear ache,  post nasal drip,  Cardio-vascular:  No chest pain, Orthopnea, PND, anasarca, dizziness, palpitations.no Bilateral lower extremity swelling  GI:  No heartburn, indigestion, abdominal pain, nausea, vomiting, diarrhea, change in bowel habits, loss of appetite, melena, blood in stool, hematemesis Resp:  no shortness of breath at rest. No dyspnea on exertion, No excess mucus, no productive cough, No non-productive cough, No coughing up of blood.No change in color of mucus.No wheezing. Skin:  no rash or lesions. No jaundice GU:  no dysuria, change  in color of urine, no urgency or frequency. No straining to urinate.  No flank pain.  Musculoskeletal:  No joint pain or no joint swelling. No decreased range of motion. No back pain.  Psych:  No change in mood or affect. No depression or anxiety. No memory loss.  Neuro: no localizing neurological complaints, no tingling, no weakness, no double vision, no no slurred speech, no confusion  Otherwise ROS are negative except for above, 10 systems were reviewed  Past Medical History: Past Medical History  Diagnosis Date  . Bipolar disorder   . Anxiety   . Cancer     adenosquamous of eye and neck  . Depression   . Thyroid disease     thyroid cancer  . Hypertension   . Hyperlipidemia   . Nephrolithiasis   . Immune deficiency disorder   . GERD (gastroesophageal reflux disease)   . BPH (benign prostatic hyperplasia)    Past Surgical History  Procedure Date  . Orthopedic surgery   . Hernia repair   . Thyroidectomy   . Occuloplastic surgery   . Ankle surgery   . Shoulder surgery      Medications: Prior to Admission medications   Medication Sig Start Date End Date Taking? Authorizing Provider  amLODipine (NORVASC) 5 MG tablet Take 1 tablet (5 mg total) by mouth daily. 11/13/11  Yes Lewayne Bunting, MD  amoxicillin-clavulanate (AUGMENTIN) 875-125 MG per tablet Take 1 tablet by mouth Twice daily. 12/27/11  Yes Historical Provider, MD  aspirin EC 81 MG tablet Take 1 tablet (81 mg total) by mouth daily. 07/22/11 07/21/12 Yes Lewayne Bunting, MD  atorvastatin (LIPITOR) 40 MG tablet Take 40 mg by mouth at bedtime. 08/22/11 08/21/12 Yes Lewayne Bunting, MD  guaiFENesin (MUCINEX) 600 MG 12 hr tablet Take 600 mg by mouth 2 (two) times daily as needed. For congestion   Yes Historical Provider, MD  hydroxypropyl methylcellulose (ISOPTO TEARS) 2.5 % ophthalmic solution Place 1 drop into both eyes 4 (four) times daily as needed. For dry eyes   Yes Historical Provider, MD  Immune Globulin, Human,  (HIZENTRA) 4 GM/20ML SOLN Inject into the skin once a week. wednesday   Yes Historical Provider, MD  lamoTRIgine (LAMICTAL) 150 MG tablet Take 2 tablets (300 mg total) by mouth daily. 09/30/11 09/29/12 Yes Cleotis Nipper, MD  levothyroxine (SYNTHROID) 150 MCG tablet Take 150 mcg by mouth at bedtime. 11/04/11  Yes Georgina Quint Plotnikov, MD  LORazepam (ATIVAN) 1 MG tablet Take 0.5 mg by mouth at bedtime. 12/03/11  Yes Cleotis Nipper, MD  MICARDIS 80 MG tablet TAKE 1 TABLET BY MOUTH ONCE A DAY 09/03/11  Yes Lewayne Bunting, MD  naproxen (NAPROSYN) 500 MG tablet Take 500 mg by mouth 2 (two) times daily as needed. For pain   Yes Historical Provider, MD  pseudoephedrine (SUDAFED) 30 MG tablet Take 60 mg by mouth every 6 (six) hours as needed. For sinus congestion   Yes Historical Provider, MD  RABEprazole (ACIPHEX) 20 MG tablet Take 20 mg by mouth at bedtime. 11/04/11  Yes Georgina Quint Plotnikov, MD  terbinafine (LAMISIL) 250 MG tablet Take 250 mg by mouth daily.  06/21/11  Yes Historical Provider, MD  testosterone (TESTIM) 50 MG/5GM GEL Place 5 g onto the skin daily.     Yes Historical Provider, MD  zolpidem (AMBIEN) 10 MG tablet Take 5 mg by mouth at bedtime. 12/03/11  Yes Cleotis Nipper, MD  tadalafil (CIALIS) 5 MG tablet Take 1 tablet (5 mg total) by mouth daily as needed for erectile dysfunction. 11/04/11 12/04/11  Tresa Garter, MD    Allergies:  No Known Allergies  Social History:  Ambulatory  independently  Lives at  home   reports that he has never smoked. He does not have any smokeless tobacco history on file. He reports that he does not drink alcohol or use illicit drugs.   Family History: family history includes Depression in his daughter and sister and Drug abuse in his daughter.    Physical Exam: Patient Vitals for the past 24 hrs:  BP Temp Temp src Pulse Resp SpO2  01/05/12 0316 - 97.5 F (36.4 C) - - - -  01/05/12 0130 174/128 mmHg - - 82  30  99 %  01/05/12 0117 178/122 mmHg 98.4 F  (36.9 C) Oral 86  18  100 %  01/05/12 0115 178/122 mmHg - - 85  12  100 %    1. General:  in No Acute distress 2. Psychological: Alert and  Oriented 3. Head/ENT:   Moist  Mucous Membranes                          Head Non traumatic, neck supple                          Normal  Dentition 4. SKIN: normal  Skin turgor,  Skin clean Dry and intact no rash 5. Heart: Regular rate and rhythm no Murmur, Rub or gallop 6. Lungs: Clear to auscultation bilaterally, no wheezes or crackles  7. Abdomen: Soft, non-tender, Non distended 8. Lower extremities: no clubbing, cyanosis, or edema very high arches noted and hammertoes bilaterally this apparently a genetic trait. 9. 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 There is a nystagmus present beats to the right. Otherwise neurologically intact. Strength 5 out of 5 in all 4 extremities coordination appears to be intact. 10. MSK: Normal range of motion  body mass index is unknown because there is no height or weight on file.   Labs on Admission:   [facility] 01/05/12 0131  NA 134*  K 3.9  CL 98  CO2 27  GLUCOSE 107*  BUN 15  CREATININE 1.02  CALCIUM 9.0  MG --  PHOS --   No results found for this basename: AST:2,ALT:2,ALKPHOS:2,BILITOT:2,PROT:2,ALBUMIN:2 in the last 72 hours No results found for this basename: LIPASE:2,AMYLASE:2 in the last 72 hours  Basename 01/05/12 0131  WBC 10.4  NEUTROABS 6.5  HGB 15.7  HCT 45.0  MCV 80.6  PLT 165   No results found for this basename: CKTOTAL:3,CKMB:3,CKMBINDEX:3,TROPONINI:3 in the last 72 hours No results found for this basename: TSH,T4TOTAL,FREET3,T3FREE,THYROIDAB in the last 72 hours No results found for this basename: VITAMINB12:2,FOLATE:2,FERRITIN:2,TIBC:2,IRON:2,RETICCTPCT:2 in the last 72 hours No results found for this basename: HGBA1C    The CrCl is unknown  because both a height and weight (above a minimum accepted value) are required for this calculation. ABG    Component Value Date/Time   HCO3 27.7* 09/18/2007 1901   TCO2 29 09/18/2007 1901     No results found for this basename: DDIMER     Other results:  I have pearsonaly reviewed this: ECG REPORT  Rate: 84  Rhythm: Sinus rhythm ST&T Change no Ischemic changes no significant changes compared to prior  UA no evidence of infection   Cultures: No results found for this basename: sdes, specrequest, cult, reptstatus       Radiological Exams on Admission: Ct Head Wo Contrast  01/05/2012  *RADIOLOGY REPORT*  Clinical Data: Sudden onset of dizziness and off balance.  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  CT HEAD WITHOUT CONTRAST  Technique:  Contiguous axial images were obtained from the base of the skull through the vertex without contrast.  Comparison: 10/09/2007  Findings: Mild cerebral atrophy.  Mild ventricular dilatation consistent with central atrophy.  No mass effect or midline shift. No abnormal extra-axial fluid collections.  Gray-white matter junctions are distinct.  Basal cisterns are not effaced.  No evidence of acute intracranial hemorrhage.  No focal mass or edema to suggest metastasis.  If clinically indicated, MRI would be more sensitive for detection of metastatic disease in the intracranial contents.  No depressed skull fractures.  Membrane thickening or polyps in the sphenoid sinuses.  Mastoid air cells are not opacified. There appears to be fragmentation of the left mandibular head which might represent old fracture deformity or hypertrophic change.  No significant changes since the previous study.  IMPRESSION: No acute intracranial abnormalities.  Original Report Authenticated By: Marlon Pel, [name]    Assessment/Plan  56 year old gentleman with history of head and neck cancer and recent history of sinusitis presents with vertigo and  ataxia which persistent and somewhat worsening tinnitus  Present on Admission:  . question TIA versus peripheral vertigo  - will admit obtain MRI with and without contrast given history of malignancy as well as MRA of the  brain. Obtain echo and Dopplers .otherwise admit. TIA protocol he'll probably benefit from PT OT evaluation.  Marland KitchenHYPERTENSION, BENIGN continue his home medications currently stable. History of sinusitis continue Augmentin  Prophylaxis: SCD Protonix  CODE STATUS: Full code  I have spent a total of 55 min on this admission  Sharan Mcenaney 01/05/2012, 4:00 AM

## 2012-01-05 NOTE — ED Notes (Signed)
Patient stated he was watching TV and the TV looked like it was moving around the room, blurred vision, felt like he was going to pass out.  Unable to stand, unsteady on his feet due to dizziness. No HA, CP, N/V

## 2012-01-05 NOTE — Progress Notes (Signed)
Occupational Therapy Evaluation Patient Details Name: Alex [name] MRN: [id number] DOB: 09/27/1955 Today's Date: 01/05/2012 Time: 1000-1030 OT Time Calculation (min): 30 min  OT Assessment / Plan / Recommendation Clinical Impression  Pt admitted due to c/o vertigo. Recent sinusitis.History of squamous cell CA behind R eye. Received raidation. History of R eye deficits from radiation. At this time, recommend CIR at D/C. Continued POC pending on further neurology work up.    OT Assessment  Patient needs continued OT Services    Follow Up Recommendations  Inpatient Rehab    Barriers to Discharge Decreased caregiver support (? amount wife is able to help.)    Equipment Recommendations  Defer to next venue    Recommendations for Other Services Rehab consult; social work consult  Frequency  Min 2X/week    Precautions / Restrictions Precautions Precautions: Fall Precaution Comments: vertigo? Restrictions Weight Bearing Restrictions: No   Pertinent Vitals/Pain C/o dizziness with movement    ADL  Eating/Feeding: Simulated;Supervision/safety Where Assessed - Eating/Feeding: Bed level Grooming: Simulated;Minimal assistance Where Assessed - Grooming: Supine, head of bed up Upper Body Bathing: Simulated;Minimal assistance Where Assessed - Upper Body Bathing: Supine, head of bed up Lower Body Bathing: Simulated;Maximal assistance Where Assessed - Lower Body Bathing: Supine, head of bed up Upper Body Dressing: Simulated;Minimal assistance Where Assessed - Upper Body Dressing: Supine, head of bed up Lower Body Dressing: Simulated;Maximal assistance Where Assessed - Lower Body Dressing: Supine, head of bed up Transfers/Ambulation Related to ADLs: not completed at this time due to c/o dizziness/nausea with any movement. TBA ADL Comments: Max A with LB ADL due to ?vertigo.    OT Diagnosis: Disturbance of vision;Other (comment) (vertigo)  OT Problem List: Decreased strength;Impaired  balance (sitting and/or standing);Impaired vision/perception;Decreased knowledge of use of DME or AE;Other (comment) (vertigo) OT Treatment Interventions: Self-care/ADL training;Therapeutic exercise;Energy conservation;Therapeutic activities;Visual/perceptual remediation/compensation;Patient/family education;Balance training   OT Goals Acute Rehab OT Goals OT Goal Formulation: With patient Time For Goal Achievement: 01/19/12 Potential to Achieve Goals: Good ADL Goals Pt Will Perform Grooming: with modified independence;Sitting, chair;Unsupported ADL Goal: Grooming - Progress: Goal set today Pt Will Perform Upper Body Bathing: with supervision;Sitting, chair;Unsupported;with cueing (comment type and amount) (using compensatory techniques for vertogo) ADL Goal: Upper Body Bathing - Progress: Goal set today Pt Will Perform Lower Body Bathing: with mod assist;Supine, rolling right and/or left;with cueing (comment type and amount);Supported;Other (comment) (using focus to compensate for vertigo) ADL Goal: Lower Body Bathing - Progress: Goal set today Pt Will Perform Upper Body Dressing: with supervision;Supported;Sitting, chair;with cueing (comment type and amount) ADL Goal: Upper Body Dressing - Progress: Goal set today Additional ADL Goal #2: Complete smooth pursiuts ex with min vc. ADL Goal: Additional Goal #2 - Progress: Goal set today Miscellaneous OT Goals Miscellaneous OT Goal #1: complete gaze stabilization exercises with superivison. OT Goal: Miscellaneous Goal #1 - Progress: Goal set today  Visit Information  Last OT Received On: 01/05/12 Assistance Needed: +2 (OOB)    Subjective Data   I just don't feel right.   Prior Functioning  Home Living Lives With: Spouse;Daughter Available Help at Discharge: Family Type of Home: House Home Access: Stairs to enter Secretary/administrator of Steps: 3 Entrance Stairs-Rails: Right;Left Home Layout: Two level;1/2 bath on main  level;Bed/bath upstairs Alternate Level Stairs-Number of Steps: flight Alternate Level Stairs-Rails: Right Bathroom Shower/Tub: Health visitor: Standard Bathroom Accessibility: Yes How Accessible: Accessible via walker Home Adaptive Equipment: None Additional Comments: pt notes wife not very supportive at home and has  one child with multiple medical problems and another with drug and Etoh problems causing a lot of diffilculties at home.  Requested SW Consult to A pt.   Prior Function Level of Independence: Independent Able to Take Stairs?: Yes Driving: Yes Vocation: On disability Communication Communication: No difficulties Dominant Hand: Right    Cognition  Overall Cognitive Status: Appears within functional limits for tasks assessed/performed Arousal/Alertness: Awake/alert Orientation Level: Oriented X4 / Intact Behavior During Session: [facility] for tasks performed    Extremity/Trunk Assessment Right Upper Extremity Assessment RUE ROM/Strength/Tone: Deficits (R shoulder sx history. damage from radiation.) RUE ROM/Strength/Tone Deficits: ROM WFL with exception of ER RUE Sensation: WFL - Light Touch;WFL - Proprioception RUE Coordination: WFL - gross/fine motor Left Upper Extremity Assessment LUE ROM/Strength/Tone: Within functional levels LUE Sensation: WFL - Light Touch;WFL - Proprioception LUE Coordination: WFL - gross/fine motor Right Lower Extremity Assessment RLE ROM/Strength/Tone: WFL for tasks assessed RLE Sensation: WFL - Light Touch Left Lower Extremity Assessment LLE ROM/Strength/Tone: WFL for tasks assessed LLE Sensation: WFL - Light Touch   Mobility Bed Mobility Bed Mobility:  (pt asked to defer. Had just rolled with PT.) Rolling Left: 4: Min guard Left Sidelying to Sit: 4: Min assist Sitting - Scoot to Edge of Bed: 4: Min guard Sit to Sidelying Left: 4: Min guard Details for Bed Mobility Assistance: pt initially tries to just sit up and gets severe  spinning feeling with Nystagmous and notes feels like he is being pushed L back down to bed.  Returned to sitting with max cueing for gaze stabilization and pt notes only mild spinning.  Utilized gaze stabilization with return to bed.     Exercise Other Exercises Other Exercises: Performed x1 exercises and instructed on use of gaze stabilization with all mobilty and when pt being transferred down for MRI.    Balance Balance Balance Assessed: No  End of Session OT - End of Session Activity Tolerance: Treatment limited secondary to medical complications (Comment) (?vertigo) Patient left: in bed;with call bell/phone within reach;with nursing in room Nurse Communication: Other (comment) (use of compensatory technique to decrease vertigo)   Jaquetta Currier,HILLARY 01/05/2012, 10:59 AM Luisa Dago, OTR/L  [phone] 01/05/2012

## 2012-01-05 NOTE — Consult Note (Signed)
Reason for Consult:Dizziness Referring Physician: Lavera Guise  CC: Dizziness and difficulty with gait  HPI: Alex [name] is an 56 y.o. male who reports that yesterday he was watching television and had the acute onset of the sensation as if the television was moving.  This lasted for a few minutes and then resolved.  After about 45 minutes the sensation returned and was associated with difficulty walking when the patient attempted to get up.  He found himself falling to the left.  Patient decided to present for evaluation at that time.  Initial imaging was unremarkable.  Symptoms persist today. Patient reports that for the past 3 weeks has had an unusual sensation in the right ear at night-unusual sound appreciated.  Past Medical History  Diagnosis Date  . Bipolar disorder   . Anxiety   . Cancer     adenosquamous of eye and neck  . Depression   . Thyroid disease     thyroid cancer  . Hypertension   . Hyperlipidemia   . Nephrolithiasis   . Immune deficiency disorder   . GERD (gastroesophageal reflux disease)   . BPH (benign prostatic hyperplasia)     Past Surgical History  Procedure Date  . Orthopedic surgery   . Hernia repair   . Thyroidectomy   . Occuloplastic surgery   . Ankle surgery   . Shoulder surgery     Family History  Problem Relation Age of Onset  . Depression Sister   . Depression Daughter   . Drug abuse Daughter     Social History:  reports that he has never smoked. He does not have any smokeless tobacco history on file. He reports that he does not drink alcohol or use illicit drugs.  No Known Allergies  Medications:  I have reviewed the patient's current medications. Prior to Admission:  Prescriptions prior to admission  Medication Sig Dispense Refill  . amLODipine (NORVASC) 5 MG tablet Take 1 tablet (5 mg total) by mouth daily.  90 tablet  3  . amoxicillin-clavulanate (AUGMENTIN) 875-125 MG per tablet Take 1 tablet by mouth Twice daily.      [name] aspirin  EC 81 MG tablet Take 1 tablet (81 mg total) by mouth daily.  30 tablet  11  . atorvastatin (LIPITOR) 40 MG tablet Take 40 mg by mouth at bedtime.      [name] guaiFENesin (MUCINEX) 600 MG 12 hr tablet Take 600 mg by mouth 2 (two) times daily as needed. For congestion      . hydroxypropyl methylcellulose (ISOPTO TEARS) 2.5 % ophthalmic solution Place 1 drop into both eyes 4 (four) times daily as needed. For dry eyes      . Immune Globulin, Human, (HIZENTRA) 4 GM/20ML SOLN Inject into the skin once a week. wednesday      . lamoTRIgine (LAMICTAL) 150 MG tablet Take 2 tablets (300 mg total) by mouth daily.  180 tablet  0  . levothyroxine (SYNTHROID) 150 MCG tablet Take 150 mcg by mouth at bedtime.      [name] LORazepam (ATIVAN) 1 MG tablet Take 0.5 mg by mouth at bedtime.      [name] MICARDIS 80 MG tablet TAKE 1 TABLET BY MOUTH ONCE A DAY  90 tablet  2  . naproxen (NAPROSYN) 500 MG tablet Take 500 mg by mouth 2 (two) times daily as needed. For pain      . pseudoephedrine (SUDAFED) 30 MG tablet Take 60 mg by mouth every 6 (six) hours as needed. For sinus  congestion      . RABEprazole (ACIPHEX) 20 MG tablet Take 20 mg by mouth at bedtime.      . terbinafine (LAMISIL) 250 MG tablet Take 250 mg by mouth daily.       [name] testosterone (TESTIM) 50 MG/5GM GEL Place 5 g onto the skin daily.        [name] zolpidem (AMBIEN) 10 MG tablet Take 5 mg by mouth at bedtime.      . tadalafil (CIALIS) 5 MG tablet Take 1 tablet (5 mg total) by mouth daily as needed for erectile dysfunction.  30 tablet  11   Scheduled:   . amLODipine  5 mg Oral Daily  . amoxicillin-clavulanate  1 tablet Oral Q12H  . aspirin  325 mg Oral Daily  . atorvastatin  40 mg Oral QHS  . gi cocktail  30 mL Oral Once  . irbesartan  300 mg Oral Daily  . lamoTRIgine  300 mg Oral Daily  . levothyroxine  150 mcg Oral QHS  . LORazepam  0.5 mg Oral QHS  . pantoprazole  40 mg Oral Q1200  . testosterone  5 g Transdermal Daily    ROS: History obtained from the  patient  General ROS: negative for - chills, fatigue, fever, night sweats, weight gain or weight loss Psychological ROS: negative for - behavioral disorder, hallucinations, memory difficulties, mood swings or suicidal ideation Ophthalmic ROS: negative for - blurry vision, double vision, eye pain or loss of vision ENT ROS: as noted in HPI Allergy and Immunology ROS: negative for - hives or itchy/watery eyes Hematological and Lymphatic ROS: negative for - bleeding problems, bruising or swollen lymph nodes Endocrine ROS: negative for - galactorrhea, hair pattern changes, polydipsia/polyuria or temperature intolerance Respiratory ROS: negative for - cough, hemoptysis, shortness of breath or wheezing Cardiovascular ROS: negative for - chest pain, dyspnea on exertion, edema or irregular heartbeat Gastrointestinal ROS: negative for - abdominal pain, diarrhea, hematemesis, nausea/vomiting or stool incontinence Genito-Urinary ROS: negative for - dysuria, hematuria, incontinence or urinary frequency/urgency Musculoskeletal ROS: right facial tic Neurological ROS: as noted in HPI Dermatological ROS: negative for rash and skin lesion changes  Physical Examination: Blood pressure 168/114, pulse 65, temperature 98.2 F (36.8 C), temperature source Oral, resp. rate 17, height 6\' 1"  (1.854 m), weight 75.7 kg (166 lb 14.2 oz), SpO2 98.00%.  Neurologic Examination Mental Status: Alert, oriented, thought content appropriate.  Speech fluent without evidence of aphasia.  Able to follow 3 step commands without difficulty. Cranial Nerves: II: visual fields grossly normal, pupils equal, round, reactive to light and accommodation III,IV, VI: ptosis not present, extra-ocular motions intact bilaterally V,VII: smile symmetric, facial light touch sensation normal bilaterally VIII: hearing normal bilaterally IX,X: gag reflex present XI: trapezius strength/neck flexion strength normal bilaterally XII: tongue strength  normal  Motor: Right : Upper extremity   5/5    Left:     Upper extremity   5/5  Lower extremity   5/5     Lower extremity   5/5 Tone and bulk:normal tone throughout; no atrophy noted.  High arches in the feet bilaterally Right facial tic noted Sensory: Pinprick and light touch intact throughout, bilaterally Deep Tendon Reflexes: 2+ in the upper extremities, trace at the knees and absent at the ankles.   Plantars: Right: downgoing   Left: downgoing Cerebellar: normal finger-to-nose and normal heel-to-shin test  Results for orders placed during the hospital encounter of 01/05/12 (from the past 48 hour(s))  CBC     Status:  Normal   Collection Time   01/05/12  1:31 AM      Component Value Range Comment   WBC 10.4  4.0 - 10.5 (K/uL)    RBC 5.58  4.22 - 5.81 (MIL/uL)    Hemoglobin 15.7  13.0 - 17.0 (g/dL)    HCT 21.3  08.6 - 57.8 (%)    MCV 80.6  78.0 - 100.0 (fL)    MCH 28.1  26.0 - 34.0 (pg)    MCHC 34.9  30.0 - 36.0 (g/dL)    RDW 46.9  62.9 - 52.8 (%)    Platelets 165  150 - 400 (K/uL)   DIFFERENTIAL     Status: Abnormal   Collection Time   01/05/12  1:31 AM      Component Value Range Comment   Neutrophils Relative 62  43 - 77 (%)    Neutro Abs 6.5  1.7 - 7.7 (K/uL)    Lymphocytes Relative 25  12 - 46 (%)    Lymphs Abs 2.5  0.7 - 4.0 (K/uL)    Monocytes Relative 11  3 - 12 (%)    Monocytes Absolute 1.2 (*) 0.1 - 1.0 (K/uL)    Eosinophils Relative 1  0 - 5 (%)    Eosinophils Absolute 0.2  0.0 - 0.7 (K/uL)    Basophils Relative 1  0 - 1 (%)    Basophils Absolute 0.1  0.0 - 0.1 (K/uL)   BASIC METABOLIC PANEL     Status: Abnormal   Collection Time   01/05/12  1:31 AM      Component Value Range Comment   Sodium 134 (*) 135 - 145 (mEq/L)    Potassium 3.9  3.5 - 5.1 (mEq/L)    Chloride 98  96 - 112 (mEq/L)    CO2 27  19 - 32 (mEq/L)    Glucose, Bld 107 (*) 70 - 99 (mg/dL)    BUN 15  6 - 23 (mg/dL)    Creatinine, Ser 4.13  0.50 - 1.35 (mg/dL)    Calcium 9.0  8.4 - 10.5 (mg/dL)     GFR calc non Af Amer 79 (*) >90 (mL/min)    GFR calc Af Amer >90  >90 (mL/min)   URINALYSIS, ROUTINE W REFLEX MICROSCOPIC     Status: Abnormal   Collection Time   01/05/12  2:56 AM      Component Value Range Comment   Color, Urine YELLOW  YELLOW     APPearance CLOUDY (*) CLEAR     Specific Gravity, Urine 1.014  1.005 - 1.030     pH 7.5  5.0 - 8.0     Glucose, UA NEGATIVE  NEGATIVE (mg/dL)    Hgb urine dipstick NEGATIVE  NEGATIVE     Bilirubin Urine NEGATIVE  NEGATIVE     Ketones, ur NEGATIVE  NEGATIVE (mg/dL)    Protein, ur NEGATIVE  NEGATIVE (mg/dL)    Urobilinogen, UA 0.2  0.0 - 1.0 (mg/dL)    Nitrite NEGATIVE  NEGATIVE     Leukocytes, UA NEGATIVE  NEGATIVE  MICROSCOPIC NOT DONE ON URINES WITH NEGATIVE PROTEIN, BLOOD, LEUKOCYTES, NITRITE, OR GLUCOSE <1000 mg/dL.  CARDIAC PANEL(CRET KIN+CKTOT+MB+TROPI)     Status: Abnormal   Collection Time   01/05/12  6:20 AM      Component Value Range Comment   Total CK 261 (*) 7 - 232 (U/L)    CK, MB 5.2 (*) 0.3 - 4.0 (ng/mL)    Troponin I <0.30  <0.30 (ng/mL)  Relative Index 2.0  0.0 - 2.5    GLUCOSE, CAPILLARY     Status: Abnormal   Collection Time   01/05/12  6:46 AM      Component Value Range Comment   Glucose-Capillary 106 (*) 70 - 99 (mg/dL)    Comment 1 Notify RN      Comment 2 Documented in Chart       No results found for this or any previous visit (from the past 240 hour(s)).  Ct Head Wo Contrast  01/05/2012  *RADIOLOGY REPORT*  Clinical Data: Sudden onset of dizziness and off balance.  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  CT HEAD WITHOUT CONTRAST  Technique:  Contiguous axial images were obtained from the base of the skull through the vertex without contrast.  Comparison: 10/09/2007  Findings: Mild cerebral atrophy.  Mild ventricular dilatation consistent with central atrophy.  No mass effect or midline shift. No abnormal extra-axial fluid collections.  Gray-white matter junctions are distinct.  Basal  cisterns are not effaced.  No evidence of acute intracranial hemorrhage.  No focal mass or edema to suggest metastasis.  If clinically indicated, MRI would be more sensitive for detection of metastatic disease in the intracranial contents.  No depressed skull fractures.  Membrane thickening or polyps in the sphenoid sinuses.  Mastoid air cells are not opacified. There appears to be fragmentation of the left mandibular head which might represent old fracture deformity or hypertrophic change.  No significant changes since the previous study.  IMPRESSION: No acute intracranial abnormalities.  Original Report Authenticated By: Marlon Pel, [name]     Assessment/Plan:  Patient Active Hospital Problem List:  Vertigo (01/05/2012)   Assessment: Symptoms although improved, not resolved.  Can not rule out a posterior circulation event.  With recent tinnitus and past right head/facial radiation, surgery, an inner ear etiology remains in the differential as well.    Plan:  1.  MRI of the brain pending.  If unremarkable for stroke would have ENT evaluate and would consider Meclizine.  If diagnostic for an acute infarct would continue stroke work up and start Plavix 75mg  daily in the place of aspirin.  2.  Vestibular therapy  Thana Farr, MD [facility] [phone] 01/05/2012, 11:23 AM

## 2012-01-06 DIAGNOSIS — R269 Unspecified abnormalities of gait and mobility: Secondary | ICD-10-CM

## 2012-01-06 DIAGNOSIS — G459 Transient cerebral ischemic attack, unspecified: Secondary | ICD-10-CM

## 2012-01-06 DIAGNOSIS — I10 Essential (primary) hypertension: Secondary | ICD-10-CM

## 2012-01-06 DIAGNOSIS — R42 Dizziness and giddiness: Secondary | ICD-10-CM

## 2012-01-06 LAB — LIPID PANEL
Cholesterol: 157 mg/dL (ref 0–200)
HDL: 75 mg/dL (ref >39)
LDL Cholesterol: 64 mg/dL (ref 0–99)
Total CHOL/HDL Ratio: 2.1 RATIO
Triglycerides: 91 mg/dL (ref <150)
VLDL: 18 mg/dL (ref 0–40)

## 2012-01-06 LAB — GLUCOSE, CAPILLARY
Glucose-Capillary: 117 mg/dL — ABNORMAL HIGH (ref 70–99)
Glucose-Capillary: 100 mg/dL — ABNORMAL HIGH (ref 70–99)

## 2012-01-06 MED ORDER — MECLIZINE HCL 25 MG PO TABS: 25.0000 mg | ORAL_TABLET | Freq: Three times a day (TID) | ORAL | Status: AC | PRN

## 2012-01-06 MED ORDER — MOXIFLOXACIN HCL 400 MG PO TABS: 400.0000 mg | ORAL_TABLET | Freq: Every day | ORAL | Status: AC

## 2012-01-06 MED ORDER — MECLIZINE HCL 25 MG PO TABS
25.0000 mg | ORAL_TABLET | Freq: Three times a day (TID) | ORAL | Status: DC | PRN
  Filled 2012-01-06: qty 1

## 2012-01-06 NOTE — Progress Notes (Signed)
Pt given dischare instructions, reviewed RX., waiting for ride home. Requesting that foley be removed just pior to his leaving.  No questions voiced at this time.

## 2012-01-06 NOTE — Progress Notes (Signed)
Physical Therapy Treatment Patient Details Name: Alex [name] MRN: [id number] DOB: 01-Dec-1955 Today's Date: 01/06/2012 Time: 0811-0903 PT Time Calculation (min): 52 min  PT Assessment / Plan / Recommendation Comments on Treatment Session  Noted MRI negative; pt's vertigo symptoms are provoked by head or general movement and last <30 seconds. Noted Lt rotational nystagmus when laying HOB 30 to 0 lasting ~45 seconds. Performed Lt Hallpike-Dix with vertical nystagmus noted ~30 seconds; proceeded with Epley maneuver with pt denying any further vertigo with position changes (including side to sit). Appears pt may have acute neuritis vs labyrinthitis based on duration of symptoms when they occur. Per MD, he will be scheduled to see an ENT on discharge.    Follow Up Recommendations  Outpatient PT;Supervision - Intermittent  Pt given home exercise program to address vertigo symptoms until he sees the ENT, they can then decide if further OPPT warranted.   Barriers to Discharge        Equipment Recommendations  None recommended by PT    Recommendations for Other Services Other (comment) (SW consult, pt with complex emotional issues/family issues)  Frequency Min 3X/week   Plan Discharge plan needs to be updated;Frequency remains appropriate    Precautions / Restrictions Precautions Precautions: Fall Precaution Comments: due to vertigo Restrictions Weight Bearing Restrictions: No   Pertinent Vitals/Pain Denied pain    Mobility  Bed Mobility Bed Mobility: Rolling Right;Right Sidelying to Sit;Sitting - Scoot to [place] of Bed Rolling Right: 6: Modified independent (Device/Increase time) Right Sidelying to Sit: 5: Supervision Sitting - Scoot to Edge of Bed: 7: Independent Details for Bed Mobility Assistance: supervision due to vertigo and possible imbalance Transfers Transfers: Sit to Stand;Stand to Sit Sit to Stand: 4: Min guard Stand to Sit: 4: Min guard Details for Transfer Assistance:  assist due to off-balance/queezy feeling (denied vertigo) Ambulation/Gait Ambulation/Gait Assistance: 4: Min guard Ambulation Distance (Feet): 150 Feet Assistive device: None Ambulation/Gait Assistance Details: initially pt holding "A" at arm's length and vertigo at worst 1/10; progressed to looking at target down the hall and pt remained 1-2/10; instructed in turning head/eyes first, find target, settle dizziness, and then turn body and pt able to keep dizziness <3/10 (when turned without this technique 6/10); pt with slightly wide base of support and  no arm swing (low-guard position); no loss of balance Gait velocity: decr    Exercises Other Exercises Other Exercises: x 1 exercises for gaze stabilization; pt able to perform slowly with symptoms 1-3/10, encouraged to incr speed of head turns and quickly incr to 6/10   PT Diagnosis:    PT Problem List:   PT Treatment Interventions:     PT Goals Acute Rehab PT Goals PT Goal: Rolling Supine to Right Side - Progress: Met PT Goal: Supine/Side to Sit - Progress: Progressing toward goal PT Goal: Sit to Stand - Progress: Progressing toward goal PT Goal: Stand to Sit - Progress: Progressing toward goal PT Goal: Ambulate - Progress: Progressing toward goal  Visit Information  Last PT Received On: 01/06/12 Assistance Needed: +1    Subjective Data  Subjective: Reports he walked to bathroom with nursing this a.m. with vertigo 6/10   Cognition  Overall Cognitive Status: Appears within functional limits for tasks assessed/performed Arousal/Alertness: Awake/alert Orientation Level: Appears intact for tasks assessed Behavior During Session: [facility] for tasks performed    Balance  Static Sitting Balance Static Sitting - Balance Support: No upper extremity supported;Feet supported Static Sitting - Level of Assistance: 7: Independent Static Standing Balance  Static Standing - Balance Support: No upper extremity supported Static Standing - Level of  Assistance: 5: Stand by assistance  End of Session PT - End of Session Equipment Utilized During Treatment: Gait belt Activity Tolerance: Patient tolerated treatment well Patient left: in chair;with call bell/phone within reach    Matilynn Dacey 01/06/2012, 9:37 AM Pager [phone]

## 2012-01-06 NOTE — Discharge Summary (Signed)
Physician Discharge Summary  Alex [name] ZOX:[id number] DOB: 1956-01-02 DOA: 01/05/2012  PCP: Sonda Primes, MD, MD  Admit date: 01/05/2012 Discharge date: 01/06/2012  1. Recommendations for Outpatient Follow-up: patient will schedule visit with his ENT specialist for vertigo  Discharge Diagnoses:  Principal Problem:  *Vertigo Active Problems:  Common variable immunodeficiency  HYPERTENSION, BENIGN  CORONARY ATHEROSCLEROSIS  Bipolar 1 disorder, depressed, moderate  Ataxia   Discharge Condition: good    History of present illness:  56 yo man admitted for observation after sudden onset vertigo  Hospital Course:  1. Vertigo - patient was observed for possible posterior circulation stroke/TIA. Workup revealed though peripheral source of his vertigo, with nystagmus and negative MRI brain. Etiology is suspected to be a inner ear problem - e.g vestibular neuronitis . Patient was started on meclizine prn and he refused prednisone. He was able to ambulate prior to dc and he will f/u with pcp and with ent   Procedures:  MRI brain  Consultations:  Neurology  Discharge Exam: Filed Vitals:   01/06/12 1000  BP: 123/88  Pulse: 75  Temp: 98 F (36.7 C)  Resp: 18   Filed Vitals:   01/05/12 2217 01/06/12 0154 01/06/12 0507 01/06/12 1000  BP: 131/83 125/87 137/91 123/88  Pulse: 75 73 73 75  Temp: 98.1 F (36.7 C) 98.3 F (36.8 C) 98.1 F (36.7 C) 98 F (36.7 C)  TempSrc: Oral Oral Oral Oral  Resp: 18 20 20 18   Height:      Weight:      SpO2: 95% 96% 94% 97%   General: axox3 Cardiovascular: RRR Respiratory: CTAB  Discharge Instructions  Discharge Orders    Future Orders Please Complete By Expires   Diet - low sodium heart healthy      Increase activity slowly        Medication List  As of 01/06/2012 11:46 AM   STOP taking these medications         amoxicillin-clavulanate 875-125 MG per tablet      tadalafil 5 MG tablet         TAKE these medications          ACIPHEX 20 MG tablet   Generic drug: RABEprazole   Take 20 mg by mouth at bedtime.      amLODipine 5 MG tablet   Commonly known as: NORVASC   Take 1 tablet (5 mg total) by mouth daily.      aspirin EC 81 MG tablet   Take 1 tablet (81 mg total) by mouth daily.      atorvastatin 40 MG tablet   Commonly known as: LIPITOR   Take 40 mg by mouth at bedtime.      guaiFENesin 600 MG 12 hr tablet   Commonly known as: MUCINEX   Take 600 mg by mouth 2 (two) times daily as needed. For congestion      HIZENTRA 4 GM/20ML Soln   Generic drug: Immune Globulin (Human)   Inject into the skin once a week. wednesday      hydroxypropyl methylcellulose 2.5 % ophthalmic solution   Commonly known as: ISOPTO TEARS   Place 1 drop into both eyes 4 (four) times daily as needed. For dry eyes      lamoTRIgine 150 MG tablet   Commonly known as: LAMICTAL   Take 2 tablets (300 mg total) by mouth daily.      LORazepam 1 MG tablet   Commonly known as: ATIVAN   Take 0.5 mg  by mouth at bedtime.      meclizine 25 MG tablet   Commonly known as: ANTIVERT   Take 1 tablet (25 mg total) by mouth 3 (three) times daily as needed for dizziness or nausea.      MICARDIS 80 MG tablet   Generic drug: telmisartan   TAKE 1 TABLET BY MOUTH ONCE A DAY      moxifloxacin 400 MG tablet   Commonly known as: AVELOX   Take 1 tablet (400 mg total) by mouth daily at 6 PM.      naproxen 500 MG tablet   Commonly known as: NAPROSYN   Take 500 mg by mouth 2 (two) times daily as needed. For pain      pseudoephedrine 30 MG tablet   Commonly known as: SUDAFED   Take 60 mg by mouth every 6 (six) hours as needed. For sinus congestion      SYNTHROID 150 MCG tablet   Generic drug: levothyroxine   Take 150 mcg by mouth at bedtime.      terbinafine 250 MG tablet   Commonly known as: LAMISIL   Take 250 mg by mouth daily.      TESTIM 50 MG/5GM Gel   Generic drug: testosterone   Place 5 g onto the skin daily.       zolpidem 10 MG tablet   Commonly known as: AMBIEN   Take 5 mg by mouth at bedtime.           Follow-up Information    Schedule an appointment as soon as possible for a visit with Sonda Primes, MD.   Contact information:   [address] [name] [address] [place] [address] [phone]       Schedule an appointment as soon as possible for a visit with Osborn Coho, MD.   Contact information:   [address], [address], [address] [place] [address] [phone]           The results of significant diagnostics from this hospitalization (including imaging, microbiology, ancillary and laboratory) are listed below for reference.    Significant Diagnostic Studies: Ct Head Wo Contrast  01/05/2012  *RADIOLOGY REPORT*  Clinical Data: Sudden onset of dizziness and off balance.  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  CT HEAD WITHOUT CONTRAST  Technique:  Contiguous axial images were obtained from the base of the skull through the vertex without contrast.  Comparison: 10/09/2007  Findings: Mild cerebral atrophy.  Mild ventricular dilatation consistent with central atrophy.  No mass effect or midline shift. No abnormal extra-axial fluid collections.  Gray-white matter junctions are distinct.  Basal cisterns are not effaced.  No evidence of acute intracranial hemorrhage.  No focal mass or edema to suggest metastasis.  If clinically indicated, MRI would be more sensitive for detection of metastatic disease in the intracranial contents.  No depressed skull fractures.  Membrane thickening or polyps in the sphenoid sinuses.  Mastoid air cells are not opacified. There appears to be fragmentation of the left mandibular head which might represent old fracture deformity or hypertrophic change.  No significant changes since the previous study.  IMPRESSION: No acute intracranial abnormalities.  Original Report  Authenticated By: Marlon Pel, [name]   Mr [name] MW Contrast  01/05/2012  *RADIOLOGY REPORT*  Clinical Data:  Dizziness.  Vertigo.  History of cancer.  MRI HEAD WITHOUT AND WITH CONTRAST MRA HEAD WITHOUT CONTRAST  Technique:  Multiplanar, multiecho pulse sequences of the brain and surrounding structures were obtained without and with intravenous contrast.  Angiographic images of the head were obtained using MRA technique without contrast.  Contrast: 15mL MULTIHANCE GADOBENATE DIMEGLUMINE 529 MG/ML IV SOLN  Comparison:  Head CT same day.  MRI 06/27/2008.  MRI HEAD WITHOUT AND WITH CONTRAST  Findings:  Diffusion imaging does not show any acute or subacute infarction.  The brainstem and cerebellum are normal.  The cerebral hemispheres are normal except for mild chronic small vessel changes of the deep white matter.  No cortical or large vessel territory infarction.  No mass lesion, hemorrhage, hydrocephalus or extra- axial collection.  After contrast administration, there is no abnormal enhancement.  Major vessels at the base of the brain are patent.  No pituitary mass.  There is mild mucosal inflammation of the sphenoid sinus.  No skull or skull base lesion is seen.  The study was not done in the form of a detailed orbital exam, but no orbital lesion is seen.  IMPRESSION: No acute or significant finding.  Minimal small vessel change of the hemispheric deep white matter.  Mild mucosal inflammation of the sphenoid sinus.  MRA HEAD  Findings: Both internal carotid arteries are widely patent into the brain.  The anterior and middle cerebral vessels are patent without proximal stenosis, aneurysm or vascular malformation.  Both vertebral arteries are widely patent to the basilar.  No basilar stenosis.  Posterior circulation branch vessels appear normal. There is fetal origin of the right posterior cerebral artery.  IMPRESSION: Normal intracranial MR angiography of the large and medium-sized vessels.  Original  Report Authenticated By: Thomasenia Sales, [name]   Dg Hand 2 View Left  12/27/2011  *RADIOLOGY REPORT*  Clinical Data: Left hand pain.  Pain between the second third and third and fourth digits.  LEFT HAND - 2 VIEW  Comparison: None.  Findings: Anatomic alignment bones of the hand.  No acute fracture is identified.  Small dystrophic calcification is present adjacent to the base of the fifth metacarpal.  Destructive changes of the small finger PIP joint. Loss of joint space and marginal osteophyte suggesting a chronic destructive process or a remote injury. This may be post-traumatic, associated with inflammatory arthritis or chronic or remote septic arthritis.  Mild first MCP joint osteoarthritis.  IMPRESSION:  1.  No acute osseous abnormality. 2.  Destructive changes of the left small finger PIP joint. Differential considerations above.  The appearance is nonspecific.  Original Report Authenticated By: Andreas Newport, [name]   Mr Mra Head/brain Wo Cm  01/05/2012  *RADIOLOGY REPORT*  Clinical Data:  Dizziness.  Vertigo.  History of cancer.  MRI HEAD WITHOUT AND WITH CONTRAST MRA HEAD WITHOUT CONTRAST  Technique:  Multiplanar, multiecho pulse sequences of the brain and surrounding structures were obtained without and with intravenous contrast.  Angiographic images of the head were obtained using MRA technique without contrast.  Contrast: 15mL MULTIHANCE GADOBENATE DIMEGLUMINE 529 MG/ML IV SOLN  Comparison:  Head CT same day.  MRI 06/27/2008.  MRI HEAD WITHOUT AND WITH CONTRAST  Findings:  Diffusion imaging does not show any acute or subacute infarction.  The brainstem and cerebellum are normal.  The cerebral hemispheres are normal except for mild chronic small vessel changes of the deep white matter.  No cortical or large vessel territory infarction.  No mass lesion, hemorrhage, hydrocephalus or extra- axial collection.  After contrast administration, there is no abnormal enhancement.  Major vessels at the base  of  the brain are patent.  No pituitary mass.  There is mild mucosal inflammation of the sphenoid sinus.  No skull or skull base lesion is seen.  The study was not done in the form of a detailed orbital exam, but no orbital lesion is seen.  IMPRESSION: No acute or significant finding.  Minimal small vessel change of the hemispheric deep white matter.  Mild mucosal inflammation of the sphenoid sinus.  MRA HEAD  Findings: Both internal carotid arteries are widely patent into the brain.  The anterior and middle cerebral vessels are patent without proximal stenosis, aneurysm or vascular malformation.  Both vertebral arteries are widely patent to the basilar.  No basilar stenosis.  Posterior circulation branch vessels appear normal. There is fetal origin of the right posterior cerebral artery.  IMPRESSION: Normal intracranial MR angiography of the large and medium-sized vessels.  Original Report Authenticated By: Thomasenia Sales, [name]    Microbiology: No results found for this or any previous visit (from the past 240 hour(s)).   Labs: Basic Metabolic Panel:  Lab 01/05/12 1027  NA 134*  K 3.9  CL 98  CO2 27  GLUCOSE 107*  BUN 15  CREATININE 1.02  CALCIUM 9.0  MG --  PHOS --   Liver Function Tests: No results found for this basename: AST:5,ALT:5,ALKPHOS:5,BILITOT:5,PROT:5,ALBUMIN:5 in the last 168 hours No results found for this basename: LIPASE:5,AMYLASE:5 in the last 168 hours No results found for this basename: AMMONIA:5 in the last 168 hours CBC:  Lab 01/05/12 0131  WBC 10.4  NEUTROABS 6.5  HGB 15.7  HCT 45.0  MCV 80.6  PLT 165   Cardiac Enzymes:  Lab 01/05/12 0620  CKTOTAL 261*  CKMB 5.2*  CKMBINDEX --  TROPONINI <0.30   BNP: BNP (last 3 results) No results found for this basename: PROBNP:3 in the last 8760 hours CBG:  Lab 01/06/12 0714 01/05/12 2214 01/05/12 1633 01/05/12 1126 01/05/12 0646  GLUCAP 100* 117* 121* 107* 106*    Time coordinating discharge: 35  minutes  Signed:  Lonia Blood, MD  [facility] 01/06/2012, 11:46 AM

## 2012-01-06 NOTE — Progress Notes (Signed)
Occupational Therapy Treatment Patient Details Name: Alex [name] MRN: [id number] DOB: 1955/12/06 Today's Date: 01/06/2012 Time: 1191-4782 OT Time Calculation (min): 24 min  OT Assessment / Plan / Recommendation Comments on Treatment Session Pt. demonstrates significant improvement with balance, dizziness and ADLs.  Pt currently requires supervision for BADLs. Recommend pt sit to shower, and recommend no driving until cleared by MD - head turns evoke dizziness     Follow Up Recommendations  No OT follow up;Supervision - Intermittent    Barriers to Discharge       Equipment Recommendations  None recommended by OT    Recommendations for Other Services    Frequency Min 2X/week   Plan Discharge plan needs to be updated    Precautions / Restrictions Precautions Precautions: Fall Precaution Comments: due to vertigo   Pertinent Vitals/Pain     ADL  Grooming: Simulated;Wash/dry hands;Wash/dry face;Shaving;Supervision/safety (mild increase in dizziness) Where Assessed - Grooming: Unsupported standing Upper Body Bathing: Simulated;Supervision/safety Where Assessed - Upper Body Bathing: Supported standing Lower Body Bathing: Simulated;Supervision/safety Where Assessed - Lower Body Bathing: Supported standing Upper Body Dressing: Simulated;Set up Where Assessed - Upper Body Dressing: Unsupported sitting Lower Body Dressing: Simulated;Performed;Supervision/safety Where Assessed - Lower Body Dressing: Unsupported sit to stand Toilet Transfer: Performed;Supervision/safety Toilet Transfer Method: Sit to Barista: Comfort height toilet Toileting - Clothing Manipulation and Hygiene: Performed;Supervision/safety Where Assessed - Engineer, mining and Hygiene: Standing Tub/Shower Transfer: Supervision/safety;Simulated Web designer Method: Science writer: Walk in shower Transfers/Ambulation Related to ADLs:  supervision ADL Comments: Pt. simulated grooming standing while looking in mirror, and simulated shower standing with unilateral support.  Miminial increase in vertigo/dizziness symptoms.  Discussed options for shower seat with pt. - recommend he sit to shower due to increase risk of falls.  He verbalized understanding    OT Diagnosis:    OT Problem List:   OT Treatment Interventions:     OT Goals ADL Goals ADL Goal: Grooming - Progress: Progressing toward goals ADL Goal: Upper Body Bathing - Progress: Progressing toward goals ADL Goal: Lower Body Bathing - Progress: Progressing toward goals ADL Goal: Upper Body Dressing - Progress: Progressing toward goals ADL Goal: Additional Goal #2 - Progress: Progressing toward goals Miscellaneous OT Goals OT Goal: Miscellaneous Goal #1 - Progress: Met (Pt. demonstrated independence - PT issued exercises)  Visit Information  Last OT Received On: 01/06/12 Assistance Needed: +1    Subjective Data      Prior Functioning       Cognition  Overall Cognitive Status: Appears within functional limits for tasks assessed/performed Arousal/Alertness: Awake/alert Orientation Level: Appears intact for tasks assessed Behavior During Session: [facility] for tasks performed    Mobility Bed Mobility Bed Mobility: Supine to Sit;Sit to Supine Rolling Right: 6: Modified independent (Device/Increase time) Right Sidelying to Sit: 5: Supervision Supine to Sit: 7: Independent Sitting - Scoot to Edge of Bed: 7: Independent Sit to Supine: 7: Independent Details for Bed Mobility Assistance: supervision due to vertigo and possible imbalance Transfers Transfers: Sit to Stand;Stand to Sit Sit to Stand: 5: Supervision;From bed;From toilet Stand to Sit: 5: Supervision;To bed;To toilet Details for Transfer Assistance: assist due to off-balance/queezy feeling (denied vertigo)   Exercises Other Exercises Other Exercises: x 1 exercises for gaze stabilization; pt able to  perform slowly with symptoms 1-3/10, encouraged to incr speed of head turns and quickly incr to 6/10  Balance Balance Balance Assessed: Yes Static Sitting Balance Static Sitting - Balance Support: No upper extremity supported;Feet supported  Static Sitting - Level of Assistance: 7: Independent Static Standing Balance Static Standing - Balance Support: No upper extremity supported Static Standing - Level of Assistance: 5: Stand by assistance Dynamic Standing Balance Dynamic Standing - Level of Assistance: 5: Stand by assistance Dynamic Standing - Balance Activities:  (ADLs)  End of Session OT - End of Session Activity Tolerance: Patient tolerated treatment well Patient left: in bed;with call bell/phone within reach;with bed alarm set   Lynnetta Tom, Ursula Alert M 01/06/2012, 1:14 PM

## 2012-01-06 NOTE — Progress Notes (Signed)
Subjective: Patient reports some improvement in dizziness today.  Able to ambulate better.  Has received training in vestibular exercises from PT.  MRI of the brain reviewed and shows no evidence of acute infarct.    Objective: Current vital signs: BP 137/91  Pulse 73  Temp(Src) 98.1 F (36.7 C) (Oral)  Resp 20  Ht 6\' 1"  (1.854 m)  Wt 75.7 kg (166 lb 14.2 oz)  BMI 22.02 kg/m2  SpO2 94% Vital signs in last 24 hours: Temp:  [97.6 F (36.4 C)-98.3 F (36.8 C)] 98.1 F (36.7 C) (05/27 0507) Pulse Rate:  [73-85] 73  (05/27 0507) Resp:  [18-20] 20  (05/27 0507) BP: (114-153)/(81-103) 137/91 mmHg (05/27 0507) SpO2:  [94 %-97 %] 94 % (05/27 0507)  Intake/Output from previous day: 05/26 0701 - 05/27 0700 In: 120 [P.O.:120] Out: 750 [Urine:750] Intake/Output this shift:   Nutritional status: Cardiac  Neurologic Exam: Mental Status:  Alert, oriented, thought content appropriate. Speech fluent without evidence of aphasia. Able to follow 3 step commands without difficulty.  Cranial Nerves:  II: visual fields grossly normal, pupils equal, round, reactive to light and accommodation  III,IV, VI: ptosis not present, extra-ocular motions intact bilaterally  V,VII: smile symmetric, facial light touch sensation normal bilaterally  VIII: hearing normal bilaterally  IX,X: gag reflex present  XI: trapezius strength/neck flexion strength normal bilaterally  XII: tongue strength normal  Motor:  Right : Upper extremity 5/5     Left: Upper extremity 5/5   Lower extremity 5/5             Lower extremity 5/5  Tone and bulk:normal tone throughout; no atrophy noted. High arches in the feet bilaterally  Right facial tic noted  Sensory: Pinprick and light touch intact throughout, bilaterally  Deep Tendon Reflexes: 2+ in the upper extremities, trace at the knees and absent at the ankles.  Plantars:  Right: downgoing Left: downgoing  Cerebellar:  normal finger-to-nose and normal heel-to-shin  test   Lab Results: Results for orders placed during the hospital encounter of 01/05/12 (from the past 48 hour(s))  CBC     Status: Normal   Collection Time   01/05/12  1:31 AM      Component Value Range Comment   WBC 10.4  4.0 - 10.5 (K/uL)    RBC 5.58  4.22 - 5.81 (MIL/uL)    Hemoglobin 15.7  13.0 - 17.0 (g/dL)    HCT 16.1  09.6 - 04.5 (%)    MCV 80.6  78.0 - 100.0 (fL)    MCH 28.1  26.0 - 34.0 (pg)    MCHC 34.9  30.0 - 36.0 (g/dL)    RDW 40.9  81.1 - 91.4 (%)    Platelets 165  150 - 400 (K/uL)   DIFFERENTIAL     Status: Abnormal   Collection Time   01/05/12  1:31 AM      Component Value Range Comment   Neutrophils Relative 62  43 - 77 (%)    Neutro Abs 6.5  1.7 - 7.7 (K/uL)    Lymphocytes Relative 25  12 - 46 (%)    Lymphs Abs 2.5  0.7 - 4.0 (K/uL)    Monocytes Relative 11  3 - 12 (%)    Monocytes Absolute 1.2 (*) 0.1 - 1.0 (K/uL)    Eosinophils Relative 1  0 - 5 (%)    Eosinophils Absolute 0.2  0.0 - 0.7 (K/uL)    Basophils Relative 1  0 - 1 (%)  Basophils Absolute 0.1  0.0 - 0.1 (K/uL)   BASIC METABOLIC PANEL     Status: Abnormal   Collection Time   01/05/12  1:31 AM      Component Value Range Comment   Sodium 134 (*) 135 - 145 (mEq/L)    Potassium 3.9  3.5 - 5.1 (mEq/L)    Chloride 98  96 - 112 (mEq/L)    CO2 27  19 - 32 (mEq/L)    Glucose, Bld 107 (*) 70 - 99 (mg/dL)    BUN 15  6 - 23 (mg/dL)    Creatinine, Ser 4.09  0.50 - 1.35 (mg/dL)    Calcium 9.0  8.4 - 10.5 (mg/dL)    GFR calc non Af Amer 79 (*) >90 (mL/min)    GFR calc Af Amer >90  >90 (mL/min)   URINALYSIS, ROUTINE W REFLEX MICROSCOPIC     Status: Abnormal   Collection Time   01/05/12  2:56 AM      Component Value Range Comment   Color, Urine YELLOW  YELLOW     APPearance CLOUDY (*) CLEAR     Specific Gravity, Urine 1.014  1.005 - 1.030     pH 7.5  5.0 - 8.0     Glucose, UA NEGATIVE  NEGATIVE (mg/dL)    Hgb urine dipstick NEGATIVE  NEGATIVE     Bilirubin Urine NEGATIVE  NEGATIVE     Ketones, ur  NEGATIVE  NEGATIVE (mg/dL)    Protein, ur NEGATIVE  NEGATIVE (mg/dL)    Urobilinogen, UA 0.2  0.0 - 1.0 (mg/dL)    Nitrite NEGATIVE  NEGATIVE     Leukocytes, UA NEGATIVE  NEGATIVE  MICROSCOPIC NOT DONE ON URINES WITH NEGATIVE PROTEIN, BLOOD, LEUKOCYTES, NITRITE, OR GLUCOSE <1000 mg/dL.  CARDIAC PANEL(CRET KIN+CKTOT+MB+TROPI)     Status: Abnormal   Collection Time   01/05/12  6:20 AM      Component Value Range Comment   Total CK 261 (*) 7 - 232 (U/L)    CK, MB 5.2 (*) 0.3 - 4.0 (ng/mL)    Troponin I <0.30  <0.30 (ng/mL)    Relative Index 2.0  0.0 - 2.5    HEMOGLOBIN A1C     Status: Abnormal   Collection Time   01/05/12  6:27 AM      Component Value Range Comment   Hemoglobin A1C 5.9 (*) <5.7 (%)    Mean Plasma Glucose 123 (*) <117 (mg/dL)   GLUCOSE, CAPILLARY     Status: Abnormal   Collection Time   01/05/12  6:46 AM      Component Value Range Comment   Glucose-Capillary 106 (*) 70 - 99 (mg/dL)    Comment 1 Notify RN      Comment 2 Documented in Chart     GLUCOSE, CAPILLARY     Status: Abnormal   Collection Time   01/05/12 11:26 AM      Component Value Range Comment   Glucose-Capillary 107 (*) 70 - 99 (mg/dL)   GLUCOSE, CAPILLARY     Status: Abnormal   Collection Time   01/05/12  4:33 PM      Component Value Range Comment   Glucose-Capillary 121 (*) 70 - 99 (mg/dL)   GLUCOSE, CAPILLARY     Status: Abnormal   Collection Time   01/05/12 10:14 PM      Component Value Range Comment   Glucose-Capillary 117 (*) 70 - 99 (mg/dL)    Comment 1 Documented in Chart  Comment 2 Notify RN     LIPID PANEL     Status: Normal   Collection Time   01/06/12  5:00 AM      Component Value Range Comment   Cholesterol 157  0 - 200 (mg/dL)    Triglycerides 91  <409 (mg/dL)    HDL 75  >81 (mg/dL)    Total CHOL/HDL Ratio 2.1      VLDL 18  0 - 40 (mg/dL)    LDL Cholesterol 64  0 - 99 (mg/dL)   GLUCOSE, CAPILLARY     Status: Abnormal   Collection Time   01/06/12  7:14 AM      Component Value  Range Comment   Glucose-Capillary 100 (*) 70 - 99 (mg/dL)    Comment 1 Documented in Chart      Comment 2 Notify RN       No results found for this or any previous visit (from the past 240 hour(s)).  Lipid Panel  Basename 01/06/12 0500  CHOL 157  TRIG 91  HDL 75  CHOLHDL 2.1  VLDL 18  LDLCALC 64    Studies/Results: Ct Head Wo Contrast  01/05/2012  *RADIOLOGY REPORT*  Clinical Data: Sudden onset of dizziness and off balance.  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  CT HEAD WITHOUT CONTRAST  Technique:  Contiguous axial images were obtained from the base of the skull through the vertex without contrast.  Comparison: 10/09/2007  Findings: Mild cerebral atrophy.  Mild ventricular dilatation consistent with central atrophy.  No mass effect or midline shift. No abnormal extra-axial fluid collections.  Gray-white matter junctions are distinct.  Basal cisterns are not effaced.  No evidence of acute intracranial hemorrhage.  No focal mass or edema to suggest metastasis.  If clinically indicated, MRI would be more sensitive for detection of metastatic disease in the intracranial contents.  No depressed skull fractures.  Membrane thickening or polyps in the sphenoid sinuses.  Mastoid air cells are not opacified. There appears to be fragmentation of the left mandibular head which might represent old fracture deformity or hypertrophic change.  No significant changes since the previous study.  IMPRESSION: No acute intracranial abnormalities.  Original Report Authenticated By: Marlon Pel, [name]   Mr [name] XB Contrast  01/05/2012  *RADIOLOGY REPORT*  Clinical Data:  Dizziness.  Vertigo.  History of cancer.  MRI HEAD WITHOUT AND WITH CONTRAST MRA HEAD WITHOUT CONTRAST  Technique:  Multiplanar, multiecho pulse sequences of the brain and surrounding structures were obtained without and with intravenous contrast.  Angiographic images of the head were obtained using MRA technique  without contrast.  Contrast: 15mL MULTIHANCE GADOBENATE DIMEGLUMINE 529 MG/ML IV SOLN  Comparison:  Head CT same day.  MRI 06/27/2008.  MRI HEAD WITHOUT AND WITH CONTRAST  Findings:  Diffusion imaging does not show any acute or subacute infarction.  The brainstem and cerebellum are normal.  The cerebral hemispheres are normal except for mild chronic small vessel changes of the deep white matter.  No cortical or large vessel territory infarction.  No mass lesion, hemorrhage, hydrocephalus or extra- axial collection.  After contrast administration, there is no abnormal enhancement.  Major vessels at the base of the brain are patent.  No pituitary mass.  There is mild mucosal inflammation of the sphenoid sinus.  No skull or skull base lesion is seen.  The study was not done in the form of a detailed orbital exam, but no orbital lesion is seen.  IMPRESSION: No acute or significant finding.  Minimal small vessel change of the hemispheric deep white matter.  Mild mucosal inflammation of the sphenoid sinus.  MRA HEAD  Findings: Both internal carotid arteries are widely patent into the brain.  The anterior and middle cerebral vessels are patent without proximal stenosis, aneurysm or vascular malformation.  Both vertebral arteries are widely patent to the basilar.  No basilar stenosis.  Posterior circulation branch vessels appear normal. There is fetal origin of the right posterior cerebral artery.  IMPRESSION: Normal intracranial MR angiography of the large and medium-sized vessels.  Original Report Authenticated By: Thomasenia Sales, [name]   Mr Mra Head/brain Wo Cm  01/05/2012  *RADIOLOGY REPORT*  Clinical Data:  Dizziness.  Vertigo.  History of cancer.  MRI HEAD WITHOUT AND WITH CONTRAST MRA HEAD WITHOUT CONTRAST  Technique:  Multiplanar, multiecho pulse sequences of the brain and surrounding structures were obtained without and with intravenous contrast.  Angiographic images of the head were obtained using MRA technique  without contrast.  Contrast: 15mL MULTIHANCE GADOBENATE DIMEGLUMINE 529 MG/ML IV SOLN  Comparison:  Head CT same day.  MRI 06/27/2008.  MRI HEAD WITHOUT AND WITH CONTRAST  Findings:  Diffusion imaging does not show any acute or subacute infarction.  The brainstem and cerebellum are normal.  The cerebral hemispheres are normal except for mild chronic small vessel changes of the deep white matter.  No cortical or large vessel territory infarction.  No mass lesion, hemorrhage, hydrocephalus or extra- axial collection.  After contrast administration, there is no abnormal enhancement.  Major vessels at the base of the brain are patent.  No pituitary mass.  There is mild mucosal inflammation of the sphenoid sinus.  No skull or skull base lesion is seen.  The study was not done in the form of a detailed orbital exam, but no orbital lesion is seen.  IMPRESSION: No acute or significant finding.  Minimal small vessel change of the hemispheric deep white matter.  Mild mucosal inflammation of the sphenoid sinus.  MRA HEAD  Findings: Both internal carotid arteries are widely patent into the brain.  The anterior and middle cerebral vessels are patent without proximal stenosis, aneurysm or vascular malformation.  Both vertebral arteries are widely patent to the basilar.  No basilar stenosis.  Posterior circulation branch vessels appear normal. There is fetal origin of the right posterior cerebral artery.  IMPRESSION: Normal intracranial MR angiography of the large and medium-sized vessels.  Original Report Authenticated By: Thomasenia Sales, [name]    Medications:  I have reviewed the patient's current medications. Scheduled:   . amLODipine  5 mg Oral Daily  . aspirin  325 mg Oral Daily  . atorvastatin  40 mg Oral QHS  . irbesartan  300 mg Oral Daily  . lamoTRIgine  300 mg Oral Daily  . levothyroxine  150 mcg Oral QHS  . LORazepam  0.5 mg Oral QHS  . moxifloxacin  400 mg Oral q1800  . pantoprazole  40 mg Oral Q1200  .  testosterone  5 g Transdermal Daily  . DISCONTD: amoxicillin-clavulanate  1 tablet Oral Q12H    Assessment/Plan:  Patient Active Hospital Problem List: Vertigo (01/05/2012)   Assessment: Some improvement noted.  No evidence of a central cause.  Will need evaluation by ENT.     Plan:  1. Meclizine prn  2. ENT evaluation.  Patient sees Dr. Kelli Churn and will need an appointment to see at discharge  No further neurologic intervention is recommended  at this time.  If further questions arise, please call or page at that time.  Thank you for allowing neurology to participate in the care of this patient.  Thana Farr, MD [facility] [phone] 01/06/2012  9:10 AM     LOS: 1 day   Thana Farr, MD [facility] [phone] 01/06/2012  9:03 AM

## 2012-01-13 ENCOUNTER — Ambulatory Visit (HOSPITAL_COMMUNITY): Payer: Self-pay | Admitting: Psychiatry

## 2012-01-14 ENCOUNTER — Ambulatory Visit (INDEPENDENT_AMBULATORY_CARE_PROVIDER_SITE_OTHER): Payer: BC Managed Care – PPO | Admitting: Licensed Clinical Social Worker

## 2012-01-14 DIAGNOSIS — F3132 Bipolar disorder, current episode depressed, moderate: Secondary | ICD-10-CM

## 2012-01-14 NOTE — Progress Notes (Signed)
   THERAPIST PROGRESS NOTE  Session Time: 2:00pm-2:50pm  Participation Level: Active  Behavioral Response: Well GroomedAlertAnxious  Type of Therapy: Individual Therapy  Treatment Goals addressed: Anxiety and Coping  Interventions: CBT, Motivational Interviewing, Solution Focused and Reframing  Summary: Alex [name] is a 56 y.o. male who presents with anxious mood and affect. Her reports being hospitalized over the past week for a suspected stroke, which was ruled out as vertigo. He is frustrated with his wife for giving him a hard time over the fact that he called 911 when he was unable to stand up. He did not call for his wife to come downstairs and help him and called 911 from his cell phone instead. He continues to experience stress and fear related to his children. He and his wife ended up paying for the car to be fixed and allowed Avis to take it back to the beach despite not wanting to do this. Patient blames his wife for avoiding conflict with their daughter, but avoids the conflict himself as well. He is upset with his son for continued SIB and he doesn't understand why he continues to hurt himself. Patient's sleep has been disrupted and his appetite is wnl.    Suicidal/Homicidal: Nowithout intent/plan  Therapist Response: Reviewed progress and assessed current functioning. Processed his fear, anxiety and anger. Challenged his continued conflict avoidance, which patient is resistant to change. Used motivational interviewing to assist and encourage patient through the change process. Explored patients barriers to change. His thinking is negative and distorted, avoiding responsibility for his own actions. Used CBT to assist patient with the identification of his negative distortions and irrational thoughts. Encouraged patient to verbalize alternative and factual responses which challenge his distortions. Reviewed patients self care plan. Assessed his progress related to self care.  Patient's self care is fair. Recommend proper diet, regular exercise, socialization and recreation.   Plan: Return again in two weeks.  Diagnosis: Axis I: Bipolar, Depressed    Axis II: No diagnosis    Eureka Valdes, LCSW 01/14/2012

## 2012-01-15 ENCOUNTER — Ambulatory Visit (INDEPENDENT_AMBULATORY_CARE_PROVIDER_SITE_OTHER): Payer: BC Managed Care – PPO | Admitting: Psychiatry

## 2012-01-15 ENCOUNTER — Other Ambulatory Visit: Payer: Self-pay

## 2012-01-15 DIAGNOSIS — F319 Bipolar disorder, unspecified: Secondary | ICD-10-CM

## 2012-01-15 DIAGNOSIS — F39 Unspecified mood [affective] disorder: Secondary | ICD-10-CM

## 2012-01-15 MED ORDER — LAMOTRIGINE 150 MG PO TABS: 150.0000 mg | ORAL_TABLET | Freq: Two times a day (BID) | ORAL | Status: DC

## 2012-01-15 MED ORDER — ZOLPIDEM TARTRATE 10 MG PO TABS: 5.0000 mg | ORAL_TABLET | Freq: Every day | ORAL | Status: DC

## 2012-01-15 MED ORDER — LORAZEPAM 1 MG PO TABS: 0.5000 mg | ORAL_TABLET | Freq: Every day | ORAL | Status: DC

## 2012-01-15 MED ORDER — LEVOTHYROXINE SODIUM 150 MCG PO TABS: 150.0000 ug | ORAL_TABLET | Freq: Every day | ORAL | Status: DC

## 2012-01-15 NOTE — Progress Notes (Signed)
Chief complaint Medication management and followup.      History of presenting illness Patient is 56 year old Caucasian married man who came for his followup appointment.  He has been under a lot of stress in past few weeks.  He endorse family issues which has been getting worse.  Daughter continues to demand more money and his son has psychiatric issues.  Patient complained that her bike does not support him to make important decision.  Patient was admitted to the [facility] 2 weeks ago due to strokelike condition .  He has extensive workup including MRI and CT scan however they were normal .  He was diagnosed with vertigo and recommended to see his primary care Dr. for followup.  Patient believe due to distress he has not able to sleep very well however he likes Ambien and lorazepam which works well.  He is taking his Lamictal twice a day and denies any side effects including any rash or itching.  He endorse some time agitation anger and mood swing but is scared to take any other medication.  He is a chronic depression which has been getting worse due to his financial distress and family issues.  He sleeps 4-5 hours with the help of Ambien and Ativan.  He's not drinking or using any illegal substance.  He has any paranoia or any hallucination.  He is seeing therapist for coping and social skills.  Current psychiatric medication Lamictal 150 mg twice a day Ativan 0.5 mg 1-2 tablet as needed  Ambien 10 mg half tablet at bedtime .  Past psychiatric illness Patient has been seeing in this office since 2009. He has history of depression mood swing. [facility]. At that time he was stressed about the finances and family issues. In the past he has taken Seroquel and Cymbalta.  Family history of psychiatric illness Patient's sister has alcohol issues and depression. One sister was admitted inpatient in [facility]  Alcohol and  substance use history Patient denies any history of alcohol or substance use.  Medical history Patient has history of hypertension herpetic anemia, immune deficiency disorder, and GERD and tic disorder.  He is taking multiple medication for his health.  His primary care physician is Dr. Barnie Alderman.   Surgical history Patient has history of thyroidectomy, ankle surgery, shoulder surgery, oculoplastic surgery and orthopedic surgery.  Psychosocial history Patient lives with her wife and daughter. Her daughter lives in [place] area.  Lab results His last lab test which was done 01/05/2012 shows normal cholesterol and triglyceride .  His hemoglobin A1c was 5.9.  His imaging to studies were unremarkable.    Mental status examination Patient is casually dressed and fairly groomed. He is anxious but relevant in conversation. He appears at times restless and having tic movements.  His speech is fast but clear and coherent.  His thought processes logical linear and goal-directed. He denies any auditory or visual hallucination. There no psychotic symptoms present. He described his mood is anxious and depressed. He denies any active or passive suicidal thoughts or homicidal thoughts. His attention and concentration is fair. His alert and oriented x3. His insight judgment and pulse control is okay.  Assessment Axis I mood disorder NOS, bipolar disorder 1 Axis II deferred Axis III see medical history Axis IV mild to moderate Axis V 55-65  Plan I reviewed psychosocial stressors, medication, current laboratory results and previous progress notes.  He scheduled to see his  primary care physician.  I recommended to see therapist regularly for increase coping and social skills.  I will continue his current medication which is working well at this time.  At this time patient reported no side effects of medication including any rash.  He is not abusing his Ambien and Ativan.  He is not drinking or using any  illegal substances.  I recommended to call us if he has noticed worsening of her symptoms or any time having suicidal thoughts or homicidal thoughts.  Otherwise I will see him again in 2 months.  Time spent 30 minutes.

## 2012-01-27 ENCOUNTER — Ambulatory Visit (HOSPITAL_COMMUNITY): Payer: Self-pay | Admitting: Licensed Clinical Social Worker

## 2012-02-03 ENCOUNTER — Ambulatory Visit (INDEPENDENT_AMBULATORY_CARE_PROVIDER_SITE_OTHER): Payer: BC Managed Care – PPO | Admitting: Licensed Clinical Social Worker

## 2012-02-03 ENCOUNTER — Encounter (HOSPITAL_COMMUNITY): Payer: Self-pay | Admitting: Licensed Clinical Social Worker

## 2012-02-03 DIAGNOSIS — F3132 Bipolar disorder, current episode depressed, moderate: Secondary | ICD-10-CM

## 2012-02-03 NOTE — Progress Notes (Signed)
   THERAPIST PROGRESS NOTE  Session Time: 1:00pm-1:50pm  Participation Level: Active  Behavioral Response: Well GroomedAlertAnxious and Depressed  Type of Therapy: Individual Therapy  Treatment Goals addressed: Coping  Interventions: CBT, Motivational Interviewing, Supportive and Reframing  Summary: Alex [name] is a 56 y.o. male who presents with anxious mood and affect. He endorses continued high levels of stress related to his son and daughter. His son continues to harm himself and patient does not understand why he is doing this and can't understand why he is depressed since his life looks "good on the outside". He is angry with his wife for her lack of support and lack of financial contribution to the marriage. He is angry with his family for not living up to their financial responsibilities, but will not confront and make changes out of fear of conflict with his wife. He is getting 6 hours of sleep, which is interrupted. His appetite is poor as well. He remains socially isolated and highly dependent upon his wife.   Suicidal/Homicidal: Nowithout intent/plan  Therapist Response: Reviewed patients progress and assessed his current functioning. Patient remains avoidaint of conflict and highly dependent upon his wife. Processed his fear and anxiety related to his son's SIB. Further educated patient on SIB, borderline personality disorder and treatment protocols. Challenged patients avoidance and used DBT to help patient build distress tolerance skills. Explored patients low self esteem and used motivational interviewing to help him move towards socializing outside his home. Used CBT to assist patient with the identification of his negative distortions and irrational thoughts. Encouraged patient to verbalize alternative and factual responses which challenge his distortions. Reviewed patients self care plan. Assessed his progress related to self care. Patient's self care is fair. Recommend  proper diet, regular exercise, socialization and recreation.   Plan: Return again in two weeks.  Diagnosis: Axis I: Bipolar, mixed    Axis II: No diagnosis    Trachelle Low, LCSW 02/03/2012

## 2012-02-17 ENCOUNTER — Ambulatory Visit (HOSPITAL_COMMUNITY): Payer: Self-pay | Admitting: Licensed Clinical Social Worker

## 2012-02-18 ENCOUNTER — Ambulatory Visit (INDEPENDENT_AMBULATORY_CARE_PROVIDER_SITE_OTHER): Payer: BC Managed Care – PPO | Admitting: Licensed Clinical Social Worker

## 2012-02-18 DIAGNOSIS — F3132 Bipolar disorder, current episode depressed, moderate: Secondary | ICD-10-CM

## 2012-02-18 NOTE — Progress Notes (Signed)
   THERAPIST PROGRESS NOTE  Session Time: 1:00pm-1:50pm  Participation Level: Active  Behavioral Response: Well GroomedAlertAnxious and Depressed  Type of Therapy: Individual Therapy  Treatment Goals addressed: Coping  Interventions: CBT, Motivational Interviewing, Strength-based, Reframing and Other: greif and loss  Summary: Alex [name] is a 56 y.o. male who presents with depressed mood and anxious affect. He reports worsening depression because his sister is in the late stages of her cancer and has only a few months to live. She does not want visitors but he wants to travel and see her. He is fearful about seeing her in the physical state of dying and questions if he can handle this stress. He remains confused and frustrated with his son's continued SIB and attention seeking behavior. He asks questions about SIB and seeks to understand why his son does this. He questions what he has done wrong as a parent for his son to behave this way. Patient remains angry with his wife and her lack of compassion towards him. He endorses increased isolation, with normal sleep and appetite.   Suicidal/Homicidal: Nowithout intent/plan  Therapist Response: Processed w/pt his feelings of sadness and fear about his sister. Explored his grief and normalized his grief reaction. Processed his past grief experiences of loosing his brother and mother. Explored his fear upon seeing his sister and his sadness that she does not share the same religious beliefs as he. Continue to educate patient about his son's condition, provide feedback and guidance. Patient remains resistant toward expanding his social network. Processed his fear. Used motivational interviewing to assist and encourage patient through the change process. Explored patients barriers to change. Used CBT to assist patient with the identification of his negative distortions and irrational thoughts. Encouraged patient to verbalize alternative and factual  responses which challenge his distortions. Reviewed patients self care plan. Assessed his progress related to self care. Patient's self care is fair. Recommend proper diet, regular exercise, socialization and recreation.   Plan: Return again in two weeks.  Diagnosis: Axis I: Bipolar, Depressed    Axis II: No diagnosis    Marsheila Alejo, LCSW 02/18/2012

## 2012-02-20 ENCOUNTER — Other Ambulatory Visit (HOSPITAL_COMMUNITY): Payer: Self-pay | Admitting: Psychiatry

## 2012-02-20 DIAGNOSIS — F319 Bipolar disorder, unspecified: Secondary | ICD-10-CM

## 2012-02-20 MED ORDER — LORAZEPAM 0.5 MG PO TABS: ORAL_TABLET | ORAL | Status: DC

## 2012-02-20 NOTE — Progress Notes (Signed)
Patient reported increased anxiety to his therapist who contact this writer that patient wants to take lorazepam 0.5 mg 1-2 tablet as needed for his anxiety.  Patient was taking 1 mg in the past however he was not taking full dose.  Recently we have reduce his lorazepam to 0.5 mg but patient started to feel more anxious and needed some time extra pills.  I will call pharmacy to change prescription direction and quantity.  He will take lorazepam 0.5 mg 1-2 tablet as needed for his anxiety.  I will address his anxiety issue on his next appointment.

## 2012-03-02 ENCOUNTER — Ambulatory Visit (HOSPITAL_COMMUNITY): Payer: Self-pay | Admitting: Licensed Clinical Social Worker

## 2012-03-04 ENCOUNTER — Ambulatory Visit (INDEPENDENT_AMBULATORY_CARE_PROVIDER_SITE_OTHER): Payer: BC Managed Care – PPO | Admitting: Licensed Clinical Social Worker

## 2012-03-04 DIAGNOSIS — F3132 Bipolar disorder, current episode depressed, moderate: Secondary | ICD-10-CM

## 2012-03-04 NOTE — Progress Notes (Signed)
   THERAPIST PROGRESS NOTE  Session Time: 11:30am-12:20pm  Participation Level: Active  Behavioral Response: Well GroomedAlertAnxious and Depressed  Type of Therapy: Individual Therapy  Treatment Goals addressed: Coping  Interventions: CBT, Motivational Interviewing, Supportive and Reframing  Summary: Alex [name] is a 56 y.o. male who presents with depressed mood and anxious affect. He reports that his wife recently suffered another seizure while at work. He processes his experience, fear and frustration with his wife's response to the seizure. He endorses fear that she would die because she stopped breathing. His depression is worsening and he endorses fleeting passive suicidal thoughts, without a plan or intent. His children are a protective factor and he is able to make a commitment for safety. He endorses feelings of loneliness and isolation. He wants others to talk to and express his feelings to, but does not seek out new friendships. He continues to hope that others will change so he can feel better. He is under stress financially and processes his uncertainty about how to handle this. His sleep is disrupted and his appetite has decreased.   Suicidal/Homicidal: Nowithout intent/plan  Therapist Response: Assessed current functioning and reviewed patients progress. Assessed patients safety and determined that patient is not a risk to himself or others. Reviewed crisis plan to call this office and go to the emergency room. He agrees to this plan. Patient's dependency and learned helplessness continue to maintain his anxiety and depression. Assisted him with increased insight into this behavior. Challenged patients expectations that others should change. His thinking is distorted and irrational. He uses manipulative tactics to elicit sympathy and concern from others. Used DBT to help patient develop distress tolerance, mindfulness and use distraction techniques. Used CBT to assist patient  with the identification of his negative distortions and irrational thoughts. Encouraged patient to verbalize alternative and factual responses which challenge his distortions. Used motivational interviewing to assist and encourage patient through the change process. Explored patients barriers to change. Reviewed patients self care plan. Assessed his progress related to self care. Patient's self care is fair. Recommend proper diet, regular exercise, socialization and recreation.   Plan: Return again in two weeks.  Diagnosis: Axis I: Bipolar, Depressed    Axis II: No diagnosis    Alex Gulley, LCSW 03/04/2012

## 2012-03-16 ENCOUNTER — Encounter (HOSPITAL_COMMUNITY): Payer: Self-pay | Admitting: Psychiatry

## 2012-03-16 ENCOUNTER — Ambulatory Visit (INDEPENDENT_AMBULATORY_CARE_PROVIDER_SITE_OTHER): Payer: BC Managed Care – PPO | Admitting: Psychiatry

## 2012-03-16 DIAGNOSIS — F319 Bipolar disorder, unspecified: Secondary | ICD-10-CM

## 2012-03-16 DIAGNOSIS — F39 Unspecified mood [affective] disorder: Secondary | ICD-10-CM

## 2012-03-16 MED ORDER — ZOLPIDEM TARTRATE 10 MG PO TABS: 5.0000 mg | ORAL_TABLET | Freq: Every day | ORAL | Status: DC

## 2012-03-16 MED ORDER — LORAZEPAM 0.5 MG PO TABS: ORAL_TABLET | ORAL | Status: DC

## 2012-03-16 NOTE — Progress Notes (Signed)
Chief complaint Medication management and followup.      History of presenting illness Patient is 56 year old Caucasian married man who came for his followup appointment.  He needs a refill on his Ativan and Ambien.  On his last visit we had cut down his lorazepam as he was taking half to one tablet but recently his stress level is increase.  We have called his Ativan to 1 mg 45 tablet but patient has not contact his pharmacy yet.  His wife who has diagnosed with epilepsy who does not take her medication.   Patient believe that she is in denial of her illness.  She continues to drive despite doctor's recommendation not to drive.  Patient endorse significant family stress.  Her daughter who lives in [place] continues to have multiple issues.  Patient is seeing therapist for coping skills.  He does not want to try any antidepressant or change his medication.  His tics has been increased but he is scared to take any more medication.  He is taking multiple medication for his physical illness.  He is sleeping better with Ativan and Ambien.  He denies any agitation anger mood swing but continued to endorse chronic depression and frustration.  He's not drinking or using any illegal substance.  Current psychiatric medication Lamictal 150 mg twice a day Ativan 0.5 mg 1-2 tablet as needed  Ambien 10 mg half tablet at bedtime .  Past psychiatric illness Patient has been seeing in this office since 2009. He has history of depression mood swing. [facility]. At that time he was stressed about the finances and family issues. In the past he has taken Seroquel and Cymbalta.  Family history of psychiatric illness Patient's sister has alcohol issues and depression. One sister was admitted inpatient in [facility]  Alcohol and substance use history Patient denies any history of alcohol or substance use.  Medical history Patient has history of  hypertension herpetic anemia, immune deficiency disorder, and GERD and tic disorder.  He is taking multiple medication for his health.  His primary care physician is Dr. Barnie Alderman.   Surgical history Patient has history of thyroidectomy, ankle surgery, shoulder surgery, oculoplastic surgery and orthopedic surgery.  Psychosocial history Patient lives with her wife and daughter. Her daughter lives in [place] area.  Lab results His last lab test which was done 01/05/2012 shows normal cholesterol and triglyceride .  His hemoglobin A1c was 5.9.  His imaging to studies were unremarkable.    Mental status examination Patient is casually dressed and fairly groomed. He is anxious and described his mood frustrated .  His affect is mood appropriate.  He appears at times restless and having tic movements.  His speech is fast but clear and coherent.  His thought processes logical linear and goal-directed. He denies any auditory or visual hallucination. There no psychotic symptoms present. He described his mood is anxious and depressed. He denies any active or passive suicidal thoughts or homicidal thoughts. His attention and concentration is fair. His alert and oriented x3. His insight judgment and Imppulse control is okay.  Assessment Axis I mood disorder NOS, bipolar disorder 1 Axis II deferred Axis III see medical history Axis IV mild to moderate Axis V 55-65  Plan I reviewed psychosocial stressors, medication and previous progress notes.  I will provide a new prescription of Ativan and Ambien.  Patient still has Lamictal.  One more time I recommend to try different psychotropic  medication or increase his Lamictal however patient is very reluctant to change his medication.  We discussed the safety plan that anytime if he has any question or concern if he feel having suicidal thoughts or homicidal thoughts and he need to call 911 or go to local emergency room.  Patient will see therapist for coping  and social skills.  I will see him again in 2 months.  Time spent 30 minutes.

## 2012-03-24 ENCOUNTER — Ambulatory Visit (HOSPITAL_COMMUNITY): Payer: Self-pay | Admitting: Licensed Clinical Social Worker

## 2012-03-31 ENCOUNTER — Ambulatory Visit (INDEPENDENT_AMBULATORY_CARE_PROVIDER_SITE_OTHER): Payer: BC Managed Care – PPO | Admitting: Licensed Clinical Social Worker

## 2012-03-31 ENCOUNTER — Encounter (HOSPITAL_COMMUNITY): Payer: Self-pay | Admitting: Licensed Clinical Social Worker

## 2012-03-31 DIAGNOSIS — F3132 Bipolar disorder, current episode depressed, moderate: Secondary | ICD-10-CM

## 2012-03-31 NOTE — Progress Notes (Signed)
   THERAPIST PROGRESS NOTE  Session Time: 1:00pm-1:50pm  Participation Level: Active  Behavioral Response: Well GroomedAlertAnxious and Depressed  Type of Therapy: Individual Therapy  Treatment Goals addressed: Coping  Interventions: CBT, DBT, Strength-based, Supportive and Reframing  Summary: Alex [name] is a 56 y.o. male who presents with depressed mood and anxious affect. He reports increased stress levels as his sister is actively dying from cancer and is only expected to live for another week or two. He is uncertain when he will go visit her and is upset that he does not feel welcome to stay at another siblings home if he visits. He processes his grief and fear over seeing her and her physical state. He wants to see her before she doesn't recognzie him any more and explores his options. He remains overwhelmed by family stress, continued conflict with his wife, financial stress with limited support from his wife and children as well as physical  health problems of his wife, his son and himself. He admits a lack of self care and a lack of intention to focus on helping himself. He remains focused on his needs not being met in his marriage. His sleep has been disrupted lately and his appetite is wnl. He admits that he has not been taking his immune medication for the past month due to passive suicidality, distractions and possible secondary gain. He denies any desire to actively harm himself and is able to committ to safety. His family and his faith are his protective factors.   Suicidal/Homicidal: Nowithout intent/plan  Therapist Response: Assessed patients current functioning and reviewed progress. Reviewed coping strategies. Assessed patients safety and assisted in identifying protective factors.  Reviewed crisis plan with patient. Assisted patient with the expression of hsi feelings of depression, anxiety and frustration. Patient demonstrates borderline personality and dependent  personality traits. He attempts to elicit support, compassion and attention by not taking his immune medication and is able to acknowledge this with further processing. He remains resistant to take charge of his own self care in a adaptive way. Used motivational interviewing to assist and encourage patient through the change process. Explored patients barriers to change. His thinking is negative and distorted and he provides excuses for his avoidance of self care and personal responsibility. Used CBT to assist patient with the identification of negative distortions and irrational thoughts. Encouraged patient to verbalize alternative and factual responses which challenge thought distortions. Used DBT to practice mindfulness, review distraction list and improve distress tolerance skills. Reviewed patients self care plan. Assessed  progress related to self care. Patient's self care is fair. Recommend proper diet, regular exercise, socialization and recreation.   Plan: Return again in two weeks.  Diagnosis: Axis I: Bipolar, Depressed    Axis II: No diagnosis    Janeane Cozart, LCSW 03/31/2012

## 2012-04-02 ENCOUNTER — Encounter: Payer: Self-pay | Admitting: Internal Medicine

## 2012-04-02 ENCOUNTER — Other Ambulatory Visit (INDEPENDENT_AMBULATORY_CARE_PROVIDER_SITE_OTHER): Payer: BC Managed Care – PPO

## 2012-04-02 ENCOUNTER — Ambulatory Visit (INDEPENDENT_AMBULATORY_CARE_PROVIDER_SITE_OTHER): Payer: BC Managed Care – PPO | Admitting: Internal Medicine

## 2012-04-02 VITALS — BP 122/78 | HR 76 | Temp 97.0°F | Resp 16 | Wt 177.0 lb

## 2012-04-02 DIAGNOSIS — E291 Testicular hypofunction: Secondary | ICD-10-CM

## 2012-04-02 DIAGNOSIS — M1710 Unilateral primary osteoarthritis, unspecified knee: Secondary | ICD-10-CM

## 2012-04-02 DIAGNOSIS — R739 Hyperglycemia, unspecified: Secondary | ICD-10-CM

## 2012-04-02 DIAGNOSIS — Z01818 Encounter for other preprocedural examination: Secondary | ICD-10-CM

## 2012-04-02 DIAGNOSIS — D839 Common variable immunodeficiency, unspecified: Secondary | ICD-10-CM

## 2012-04-02 DIAGNOSIS — M1712 Unilateral primary osteoarthritis, left knee: Secondary | ICD-10-CM | POA: Insufficient documentation

## 2012-04-02 DIAGNOSIS — R7309 Other abnormal glucose: Secondary | ICD-10-CM

## 2012-04-02 DIAGNOSIS — I10 Essential (primary) hypertension: Secondary | ICD-10-CM

## 2012-04-02 DIAGNOSIS — L600 Ingrowing nail: Secondary | ICD-10-CM

## 2012-04-02 LAB — CBC WITH DIFFERENTIAL/PLATELET
Basophils Absolute: 0.1 10*3/uL (ref 0.0–0.1)
Basophils Relative: 1.6 % (ref 0.0–3.0)
Eosinophils Absolute: 0.3 10*3/uL (ref 0.0–0.7)
Eosinophils Relative: 3.1 % (ref 0.0–5.0)
HCT: 51.6 % (ref 39.0–52.0)
Hemoglobin: 16.8 g/dL (ref 13.0–17.0)
Lymphocytes Relative: 18.8 % (ref 12.0–46.0)
Lymphs Abs: 1.6 10*3/uL (ref 0.7–4.0)
MCHC: 32.6 g/dL (ref 30.0–36.0)
MCV: 84.2 fl (ref 78.0–100.0)
Monocytes Absolute: 1 10*3/uL (ref 0.1–1.0)
Monocytes Relative: 11.3 % (ref 3.0–12.0)
Neutro Abs: 5.7 10*3/uL (ref 1.4–7.7)
Neutrophils Relative %: 65.2 % (ref 43.0–77.0)
Platelets: 264 10*3/uL (ref 150.0–400.0)
RBC: 6.13 Mil/uL — ABNORMAL HIGH (ref 4.22–5.81)
RDW: 15.7 % — ABNORMAL HIGH (ref 11.5–14.6)
WBC: 8.7 10*3/uL (ref 4.5–10.5)

## 2012-04-02 LAB — URINALYSIS
Bilirubin Urine: NEGATIVE
Hgb urine dipstick: NEGATIVE
Ketones, ur: NEGATIVE
Leukocytes, UA: NEGATIVE
Nitrite: NEGATIVE
Specific Gravity, Urine: 1.025 (ref 1.000–1.030)
Total Protein, Urine: NEGATIVE
Urine Glucose: NEGATIVE
Urobilinogen, UA: 0.2 (ref 0.0–1.0)
pH: 6 (ref 5.0–8.0)

## 2012-04-02 LAB — BASIC METABOLIC PANEL
BUN: 20 mg/dL (ref 6–23)
CO2: 29 mEq/L (ref 19–32)
Calcium: 8.6 mg/dL (ref 8.4–10.5)
Chloride: 101 mEq/L (ref 96–112)
Creatinine, Ser: 1 mg/dL (ref 0.4–1.5)
GFR: 86.45 mL/min (ref >60.00)
Glucose, Bld: 86 mg/dL (ref 70–99)
Potassium: 4.2 mEq/L (ref 3.5–5.1)
Sodium: 136 mEq/L (ref 135–145)

## 2012-04-02 LAB — HEPATIC FUNCTION PANEL
ALT: 25 U/L (ref 0–53)
AST: 23 U/L (ref 0–37)
Albumin: 4.4 g/dL (ref 3.5–5.2)
Alkaline Phosphatase: 62 U/L (ref 39–117)
Bilirubin, Direct: 0.2 mg/dL (ref 0.0–0.3)
Total Bilirubin: 1.1 mg/dL (ref 0.3–1.2)
Total Protein: 7.2 g/dL (ref 6.0–8.3)

## 2012-04-02 LAB — LIPID PANEL
Cholesterol: 146 mg/dL (ref 0–200)
HDL: 65.8 mg/dL (ref >39.00)
LDL Cholesterol: 61 mg/dL (ref 0–99)
Total CHOL/HDL Ratio: 2
Triglycerides: 95 mg/dL (ref 0.0–149.0)
VLDL: 19 mg/dL (ref 0.0–40.0)

## 2012-04-02 LAB — VITAMIN B12: Vitamin B-12: 929 pg/mL — ABNORMAL HIGH (ref 211–911)

## 2012-04-02 LAB — TESTOSTERONE: Testosterone: 963.17 ng/dL — ABNORMAL HIGH (ref 350.00–890.00)

## 2012-04-02 LAB — TSH: TSH: 4.2 u[IU]/mL (ref 0.35–5.50)

## 2012-04-02 LAB — HEMOGLOBIN A1C: Hgb A1c MFr Bld: 5.6 % (ref 4.6–6.5)

## 2012-04-02 MED ORDER — AMOXICILLIN-POT CLAVULANATE 875-125 MG PO TABS: 1.0000 | ORAL_TABLET | Freq: Two times a day (BID) | ORAL | Status: AC

## 2012-04-02 NOTE — Assessment & Plan Note (Signed)
Augmentin Soak

## 2012-04-02 NOTE — Progress Notes (Signed)
Subjective:    Patient ID: Alex [name], male    DOB: [birth year], 56 y.o.   MRN: [id number]  HPI Preop consult L knee arthroscopy Req by Dr Dr Jillyn Hidden The patient presents for a follow-up of  chronic hypertension, chronic dyslipidemia, prostatitis, malignancies controlled with medicines - stable  C/o R gr toe ingrown toemail clipped by podiatry 1 wk ago - still infected...  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  BP Readings from Last 3 Encounters:  04/02/12 122/78  01/06/12 123/88  11/04/11 140/100   Wt Readings from Last 3 Encounters:  04/02/12 177 lb (80.287 kg)  01/05/12 166 lb 14.2 oz (75.7 kg)  11/04/11 179 lb (81.194 kg)   BP 122/78  Pulse 76  Temp 97 F (36.1 C) (Oral)  Resp 16  Wt 177 lb (80.287 kg)  Past Medical History  Diagnosis Date  . Bipolar disorder   . Anxiety   . Cancer     adenosquamous of eye and neck  . Depression   . Thyroid disease     thyroid cancer  . Hypertension   . Hyperlipidemia   . Nephrolithiasis   . Immune deficiency disorder   . GERD (gastroesophageal reflux disease)   . BPH (benign prostatic hyperplasia)    Past Surgical History  Procedure Date  . Orthopedic surgery   . Hernia repair   . Thyroidectomy   . Occuloplastic surgery   . Ankle surgery   . Shoulder surgery     reports that he has never smoked. He does not have any smokeless tobacco history on file. He reports that he does not drink alcohol or use illicit drugs. family history includes Depression in his daughter and sister and Drug abuse in his daughter. No Known Allergies   Current Outpatient Prescriptions on File Prior to Visit  Medication Sig Dispense Refill  . aspirin EC 81 MG tablet Take 1 tablet (81 mg total) by mouth daily.  30 tablet  11  . atorvastatin (LIPITOR) 40 MG tablet Take 40 mg by mouth at bedtime.      [name]  guaiFENesin (MUCINEX) 600 MG 12 hr tablet Take 600 mg by mouth 2 (two) times daily as needed. For congestion      . hydroxypropyl methylcellulose (ISOPTO TEARS) 2.5 % ophthalmic solution Place 1 drop into both eyes 4 (four) times daily as needed. For dry eyes      . Immune Globulin, Human, (HIZENTRA) 4 GM/20ML SOLN Inject into the skin once a week. wednesday      . lamoTRIgine (LAMICTAL) 150 MG tablet Take 1 tablet (150 mg total) by mouth 2 (two) times daily.  180 tablet  0  . levothyroxine (SYNTHROID) 150 MCG tablet Take 1 tablet (150 mcg total) by mouth at bedtime.  90 tablet  1  . LORazepam (ATIVAN) 0.5 MG tablet Take 1 to 2 tab as need  45 tablet  1  . MICARDIS 80 MG tablet TAKE 1 TABLET BY MOUTH ONCE A DAY  90 tablet  2  . naproxen (NAPROSYN) 500 MG tablet Take 500 mg by mouth 2 (two) times daily as needed. For pain      . RABEprazole (ACIPHEX) 20 MG tablet Take 20 mg by mouth at bedtime.      . terbinafine (LAMISIL) 250 MG tablet Take 250 mg by mouth daily.       [name]  zolpidem (AMBIEN) 10 MG tablet Take 0.5 tablets (5 mg total) by mouth at bedtime.  30 tablet  1  . amLODipine (NORVASC) 5 MG tablet Take 1 tablet (5 mg total) by mouth daily.  90 tablet  3  . pseudoephedrine (SUDAFED) 30 MG tablet Take 60 mg by mouth every 6 (six) hours as needed. For sinus congestion      . testosterone (TESTIM) 50 MG/5GM GEL Place 5 g onto the skin daily.           Review of Systems  Constitutional: Negative for appetite change, fatigue and unexpected weight change.  HENT: Negative for nosebleeds, congestion, sore throat, sneezing, trouble swallowing and neck pain.   Eyes: Negative for itching and visual disturbance.  Respiratory: Negative for cough.   Cardiovascular: Negative for chest pain, palpitations and leg swelling.  Gastrointestinal: Negative for nausea, diarrhea, blood in stool and abdominal distention.  Genitourinary: Negative for frequency and hematuria.  Musculoskeletal: Negative for back pain,  joint swelling and gait problem.  Skin: Negative for rash.  Neurological: Negative for dizziness, tremors, speech difficulty and weakness.  Psychiatric/Behavioral: Negative for suicidal ideas, disturbed wake/sleep cycle, dysphoric mood and agitation. The patient is nervous/anxious.        Objective:   Physical Exam  Constitutional: He is oriented to person, place, and time. He appears well-developed. No distress.  HENT:  Mouth/Throat: Oropharynx is clear and moist.  Eyes: Conjunctivae are normal. Pupils are equal, round, and reactive to light.  Neck: Normal range of motion. No JVD present. No thyromegaly present.  Cardiovascular: Normal rate, regular rhythm, normal heart sounds and intact distal pulses.  Exam reveals no gallop and no friction rub.   No murmur heard. Pulmonary/Chest: Effort normal and breath sounds normal. No respiratory distress. He has no wheezes. He has no rales. He exhibits no tenderness.  Abdominal: Soft. Bowel sounds are normal. He exhibits no distension and no mass. There is no tenderness. There is no rebound and no guarding.  Genitourinary: Rectum normal and prostate normal. Guaiac negative stool.       Testes nl  Musculoskeletal: Normal range of motion. He exhibits no edema and no tenderness.  Lymphadenopathy:    He has no cervical adenopathy.  Neurological: He is alert and oriented to person, place, and time. He has normal reflexes. No cranial nerve deficit. He exhibits normal muscle tone. Coordination normal.  Skin: Skin is warm and dry. No rash noted.  Psychiatric: He has a normal mood and affect. His behavior is normal. Judgment and thought content normal.       anxious   Lab Results  Component Value Date   WBC 10.4 01/05/2012   HGB 15.7 01/05/2012   HCT 45.0 01/05/2012   PLT 165 01/05/2012   GLUCOSE 107* 01/05/2012   CHOL 157 01/06/2012   TRIG 91 01/06/2012   HDL 75 01/06/2012   LDLDIRECT 134.7 03/05/2007   LDLCALC 64 01/06/2012   ALT 34 11/21/2011   AST 24  11/21/2011   NA 134* 01/05/2012   K 3.9 01/05/2012   CL 98 01/05/2012   CREATININE 1.02 01/05/2012   BUN 15 01/05/2012   CO2 27 01/05/2012   TSH 4.44 10/22/2011   PSA 0.82 06/22/2010   HGBA1C 5.9* 01/05/2012          Assessment & Plan:

## 2012-04-02 NOTE — Assessment & Plan Note (Signed)
Arthroscopy is planned

## 2012-04-03 LAB — VITAMIN D 25 HYDROXY (VIT D DEFICIENCY, FRACTURES): Vit D, 25-Hydroxy: 38 ng/mL (ref 30–89)

## 2012-04-06 NOTE — Assessment & Plan Note (Signed)
Continue with current prescription therapy as reflected on the Med list.  

## 2012-04-06 NOTE — Assessment & Plan Note (Signed)
8/13 He is clear for surgery. Thank you!

## 2012-04-06 NOTE — Assessment & Plan Note (Signed)
Chronic: on IgG

## 2012-04-07 ENCOUNTER — Ambulatory Visit (HOSPITAL_COMMUNITY): Payer: Self-pay | Admitting: Licensed Clinical Social Worker

## 2012-04-09 ENCOUNTER — Other Ambulatory Visit: Payer: Self-pay | Admitting: Specialist

## 2012-04-09 MED ORDER — DEXTROSE 5 % IV SOLN: 900.0000 mg | Freq: Once | INTRAVENOUS | Status: DC

## 2012-04-13 ENCOUNTER — Other Ambulatory Visit (HOSPITAL_COMMUNITY): Payer: Self-pay | Admitting: Psychiatry

## 2012-04-13 DIAGNOSIS — F319 Bipolar disorder, unspecified: Secondary | ICD-10-CM

## 2012-04-13 MED ORDER — LAMOTRIGINE 150 MG PO TABS: 150.0000 mg | ORAL_TABLET | Freq: Two times a day (BID) | ORAL | Status: DC

## 2012-04-17 ENCOUNTER — Telehealth (HOSPITAL_COMMUNITY): Payer: Self-pay

## 2012-04-20 ENCOUNTER — Ambulatory Visit (HOSPITAL_COMMUNITY): Payer: Self-pay | Admitting: Licensed Clinical Social Worker

## 2012-04-21 ENCOUNTER — Ambulatory Visit: Payer: Self-pay | Admitting: Internal Medicine

## 2012-04-23 ENCOUNTER — Encounter (HOSPITAL_COMMUNITY): Payer: Self-pay | Admitting: Licensed Clinical Social Worker

## 2012-04-23 ENCOUNTER — Ambulatory Visit (INDEPENDENT_AMBULATORY_CARE_PROVIDER_SITE_OTHER): Payer: BC Managed Care – PPO | Admitting: Licensed Clinical Social Worker

## 2012-04-23 DIAGNOSIS — F3132 Bipolar disorder, current episode depressed, moderate: Secondary | ICD-10-CM

## 2012-04-23 NOTE — Progress Notes (Signed)
   THERAPIST PROGRESS NOTE  Session Time: 3:00pm-3:50pm  Participation Level: Active  Behavioral Response: Well GroomedAlertAnxious and Depressed  Type of Therapy: Individual Therapy  Treatment Goals addressed: Coping  Interventions: CBT, Strength-based, Reframing and Other: grief and loss  Summary: Alex [name] is a 56 y.o. male who presents with depressed mood and tearful affect. He just learned that his sister died forty minutes ago and that his father had a stroke last evening. He processes his grief and his visit to see his sister last week. He is pleased that he was able to say goodbye, put is anxious because he doesn't know if his sister ever accepted Jesus. He questions himself and if he did enough to witness to her. He is angry with his wife for not giving him the support he needs and remains focused on this. He is angry with his son for his continued behavior of self harm. He endorses fleeting suicidal ideation, without intent or plan. He is able to commit to safety and agrees to call this therapist, go to the emergency room or call the hotline if he feels uncertain of the ability to remain safe.  He denies any current SI or HI.   Suicidal/Homicidal: Nowithout intent/plan  Therapist Response: Assessed patients current functioning and reviewed progress. Reviewed coping strategies. Assessed patients safety and assisted in identifying protective factors.  Reviewed crisis plan with patient. Assisted patient with the expression of his feelings of grief and anger. Processed and normalized patients grief reaction. His thinking is negative and distorted related to his marriage and he lacks insight and capacity for change in his marriage and take control of his situation. Used CBT to assist patient with the identification of negative distortions and irrational thoughts. Encouraged patient to verbalize alternative and factual responses which challenge thought distortions. Used DBT to practice  mindfulness, review distraction list and improve distress tolerance skills. Used motivational interviewing to assist and encourage patient through the change process. Explored patients barriers to change. Reviewed patients self care plan. Assessed  progress related to self care. Patient's self care is fair. Recommend proper diet, regular exercise, socialization and recreation.   Plan: Return again in two weeks.  Diagnosis: Axis I: Bipolar, Depressed    Axis II: No diagnosis    Milas Schappell, LCSW 04/23/2012

## 2012-04-27 ENCOUNTER — Encounter (HOSPITAL_BASED_OUTPATIENT_CLINIC_OR_DEPARTMENT_OTHER): Payer: Self-pay | Admitting: *Deleted

## 2012-04-27 NOTE — Progress Notes (Addendum)
NPO AFTER MN. ARRIVES AT 0600. NEEDS ISTAT. CURRENT EKG IN EPIC AND CHART. WILL TAKE LAMICTAL AND MICARDIS AM OF SURG W/ SIP OF WATER.  REVIEWED CHART W/ DR CARIGNIN MDA, REQUESTED TO SEE ANES. RECORD W/ LAST SURGERY WHICH WAS DONE AT [facility]  IF THIS HE DID OK WITH ANES. , PT IS OK TO PROCEED. [facility] HAS BEEN CALLED, AWAITING FAX.  RECEIVED FAX FROM [facility], REVIEWED W/ DR CARIGNIN , OK TO PROCEED.

## 2012-04-28 ENCOUNTER — Telehealth (HOSPITAL_COMMUNITY): Payer: Self-pay | Admitting: *Deleted

## 2012-04-28 NOTE — Telephone Encounter (Signed)
Contacted pt to let him know that Prior Authorization applied for thru [facility]  for Zolpidem 10 mg - Take 1/2 tablet at HS - #30 tablets/30 days not approved. [facility]  has approved  #15 tablets/30 days. Pt states at this time, the medicine works well and he sleeps ok. Instructed pt to inform Dr.Arfeen if not sleeping well.   Contacted [facility] [phone] to let them know that PA not approved for #30 tablets in 30 days, only for #15 tablets.

## 2012-05-01 ENCOUNTER — Ambulatory Visit (HOSPITAL_BASED_OUTPATIENT_CLINIC_OR_DEPARTMENT_OTHER): Payer: BC Managed Care – PPO | Admitting: Anesthesiology

## 2012-05-01 ENCOUNTER — Ambulatory Visit (HOSPITAL_BASED_OUTPATIENT_CLINIC_OR_DEPARTMENT_OTHER)
Admission: RE | Admit: 2012-05-01 | Discharge: 2012-05-01 | Disposition: A | Discharge status: Home / Self Care | Payer: BC Managed Care – PPO | Source: Ambulatory Visit | Attending: Specialist | Admitting: Specialist

## 2012-05-01 ENCOUNTER — Encounter (HOSPITAL_BASED_OUTPATIENT_CLINIC_OR_DEPARTMENT_OTHER)
Admission: RE | Disposition: A | Discharge status: Home / Self Care | Payer: Self-pay | Source: Ambulatory Visit | Attending: Specialist

## 2012-05-01 ENCOUNTER — Encounter (HOSPITAL_BASED_OUTPATIENT_CLINIC_OR_DEPARTMENT_OTHER): Payer: Self-pay | Admitting: *Deleted

## 2012-05-01 ENCOUNTER — Encounter (HOSPITAL_BASED_OUTPATIENT_CLINIC_OR_DEPARTMENT_OTHER): Payer: Self-pay | Admitting: Anesthesiology

## 2012-05-01 DIAGNOSIS — IMO0002 Reserved for concepts with insufficient information to code with codable children: Secondary | ICD-10-CM | POA: Insufficient documentation

## 2012-05-01 DIAGNOSIS — Z79899 Other long term (current) drug therapy: Secondary | ICD-10-CM | POA: Insufficient documentation

## 2012-05-01 DIAGNOSIS — Z85850 Personal history of malignant neoplasm of thyroid: Secondary | ICD-10-CM | POA: Insufficient documentation

## 2012-05-01 DIAGNOSIS — K219 Gastro-esophageal reflux disease without esophagitis: Secondary | ICD-10-CM | POA: Insufficient documentation

## 2012-05-01 DIAGNOSIS — M2240 Chondromalacia patellae, unspecified knee: Secondary | ICD-10-CM | POA: Insufficient documentation

## 2012-05-01 DIAGNOSIS — M1710 Unilateral primary osteoarthritis, unspecified knee: Secondary | ICD-10-CM | POA: Insufficient documentation

## 2012-05-01 DIAGNOSIS — X58XXXA Exposure to other specified factors, initial encounter: Secondary | ICD-10-CM | POA: Insufficient documentation

## 2012-05-01 DIAGNOSIS — M179 Osteoarthritis of knee, unspecified: Secondary | ICD-10-CM

## 2012-05-01 DIAGNOSIS — I10 Essential (primary) hypertension: Secondary | ICD-10-CM | POA: Insufficient documentation

## 2012-05-01 DIAGNOSIS — M2340 Loose body in knee, unspecified knee: Secondary | ICD-10-CM | POA: Insufficient documentation

## 2012-05-01 DIAGNOSIS — M23349 Other meniscus derangements, anterior horn of lateral meniscus, unspecified knee: Secondary | ICD-10-CM | POA: Insufficient documentation

## 2012-05-01 DIAGNOSIS — E785 Hyperlipidemia, unspecified: Secondary | ICD-10-CM | POA: Insufficient documentation

## 2012-05-01 HISTORY — DX: Bipolar disorder, unspecified: F31.9

## 2012-05-01 HISTORY — DX: Personal history of malignant neoplasm of eye: Z85.840

## 2012-05-01 HISTORY — DX: Adverse effect of unspecified anesthetic, initial encounter: T41.45XA

## 2012-05-01 HISTORY — DX: Reserved for concepts with insufficient information to code with codable children: IMO0002

## 2012-05-01 HISTORY — PX: KNEE ARTHROSCOPY: SHX127

## 2012-05-01 HISTORY — DX: Other obstructive and reflux uropathy: N13.8

## 2012-05-01 HISTORY — DX: Unspecified glaucoma: H40.9

## 2012-05-01 HISTORY — DX: Personal history of malignant neoplasm of thyroid: Z85.850

## 2012-05-01 HISTORY — DX: Atherosclerotic heart disease of native coronary artery without angina pectoris: I25.10

## 2012-05-01 HISTORY — DX: Other complications of anesthesia, initial encounter: T88.59XA

## 2012-05-01 HISTORY — DX: Hesitancy of micturition: R39.11

## 2012-05-01 HISTORY — DX: Other obstructive and reflux uropathy: N40.1

## 2012-05-01 LAB — POCT I-STAT 4, (NA,K, GLUC, HGB,HCT)
Glucose, Bld: 107 mg/dL — ABNORMAL HIGH (ref 70–99)
HCT: 54 % — ABNORMAL HIGH (ref 39.0–52.0)
Hemoglobin: 18.4 g/dL — ABNORMAL HIGH (ref 13.0–17.0)
Potassium: 4.2 mEq/L (ref 3.5–5.1)
Sodium: 138 mEq/L (ref 135–145)

## 2012-05-01 SURGERY — ARTHROSCOPY, KNEE
Anesthesia: General | Site: Knee | Laterality: Left | Wound class: Clean

## 2012-05-01 MED ORDER — BUPIVACAINE-EPINEPHRINE 0.5% -1:200000 IJ SOLN
INTRAMUSCULAR | Status: DC | PRN
  Administered 2012-05-01: 20 mL

## 2012-05-01 MED ORDER — LACTATED RINGERS IV SOLN
INTRAVENOUS | Status: DC | PRN
  Administered 2012-05-01 (×2): via INTRAVENOUS

## 2012-05-01 MED ORDER — CEFAZOLIN SODIUM-DEXTROSE 2-3 GM-% IV SOLR
INTRAVENOUS | Status: DC | PRN
  Administered 2012-05-01: 2 g via INTRAVENOUS

## 2012-05-01 MED ORDER — GLYCOPYRROLATE 0.2 MG/ML IJ SOLN
INTRAMUSCULAR | Status: DC | PRN
  Administered 2012-05-01: 0.2 mg via INTRAVENOUS

## 2012-05-01 MED ORDER — CLINDAMYCIN PHOSPHATE 900 MG/50ML IV SOLN: 900.0000 mg | Freq: Once | INTRAVENOUS | Status: DC

## 2012-05-01 MED ORDER — EPHEDRINE SULFATE 50 MG/ML IJ SOLN
INTRAMUSCULAR | Status: DC | PRN
  Administered 2012-05-01 (×2): 10 mg via INTRAVENOUS
  Administered 2012-05-01: 20 mg via INTRAVENOUS

## 2012-05-01 MED ORDER — KETOROLAC TROMETHAMINE 30 MG/ML IJ SOLN: 15.0000 mg | Freq: Once | INTRAMUSCULAR | Status: DC | PRN

## 2012-05-01 MED ORDER — LACTATED RINGERS IV SOLN
INTRAVENOUS | Status: DC
  Administered 2012-05-01: 07:00:00 via INTRAVENOUS

## 2012-05-01 MED ORDER — PROMETHAZINE HCL 25 MG/ML IJ SOLN: 6.2500 mg | INTRAMUSCULAR | Status: DC | PRN

## 2012-05-01 MED ORDER — OXYCODONE-ACETAMINOPHEN 5-325 MG PO TABS: 1.0000 | ORAL_TABLET | ORAL | Status: DC | PRN

## 2012-05-01 MED ORDER — DEXAMETHASONE SODIUM PHOSPHATE 4 MG/ML IJ SOLN
INTRAMUSCULAR | Status: DC | PRN
  Administered 2012-05-01: 10 mg via INTRAVENOUS

## 2012-05-01 MED ORDER — CEFAZOLIN SODIUM-DEXTROSE 2-3 GM-% IV SOLR: 2.0000 g | INTRAVENOUS | Status: DC

## 2012-05-01 MED ORDER — FENTANYL CITRATE 0.05 MG/ML IJ SOLN: 25.0000 ug | INTRAMUSCULAR | Status: DC | PRN

## 2012-05-01 MED ORDER — SODIUM CHLORIDE 0.9 % IR SOLN
Status: DC | PRN
  Administered 2012-05-01: 08:00:00

## 2012-05-01 MED ORDER — CHLORHEXIDINE GLUCONATE 4 % EX LIQD: 60.0000 mL | Freq: Once | CUTANEOUS | Status: DC

## 2012-05-01 MED ORDER — CLINDAMYCIN PHOSPHATE 600 MG/50ML IV SOLN
INTRAVENOUS | Status: DC | PRN
  Administered 2012-05-01: 900 mg via INTRAVENOUS

## 2012-05-01 MED ORDER — LIDOCAINE HCL (CARDIAC) 20 MG/ML IV SOLN
INTRAVENOUS | Status: DC | PRN
  Administered 2012-05-01: 100 mg via INTRAVENOUS

## 2012-05-01 MED ORDER — PROPOFOL 10 MG/ML IV BOLUS
INTRAVENOUS | Status: DC | PRN
  Administered 2012-05-01: 200 mg via INTRAVENOUS

## 2012-05-01 MED ORDER — LACTATED RINGERS IV SOLN: INTRAVENOUS | Status: DC

## 2012-05-01 MED ORDER — MIDAZOLAM HCL 5 MG/5ML IJ SOLN
INTRAMUSCULAR | Status: DC | PRN
  Administered 2012-05-01 (×2): 2 mg via INTRAVENOUS

## 2012-05-01 MED ORDER — ONDANSETRON HCL 4 MG/2ML IJ SOLN
INTRAMUSCULAR | Status: DC | PRN
  Administered 2012-05-01: 4 mg via INTRAVENOUS

## 2012-05-01 MED ORDER — KETOROLAC TROMETHAMINE 30 MG/ML IJ SOLN
INTRAMUSCULAR | Status: DC | PRN
  Administered 2012-05-01: 30 mg via INTRAVENOUS

## 2012-05-01 MED ORDER — FENTANYL CITRATE 0.05 MG/ML IJ SOLN
INTRAMUSCULAR | Status: DC | PRN
  Administered 2012-05-01: 100 ug via INTRAVENOUS

## 2012-05-01 SURGICAL SUPPLY — 47 items
BANDAGE ELASTIC 6 VELCRO ST LF (GAUZE/BANDAGES/DRESSINGS) ×2 IMPLANT
BLADE 4.2CUDA (BLADE) IMPLANT
BLADE CUDA SHAVER 3.5 (BLADE) ×2 IMPLANT
BLADE GREAT WHITE 4.2 (BLADE) IMPLANT
BOOTIES KNEE HIGH SLOAN (MISCELLANEOUS) ×2 IMPLANT
CANISTER SUCT LVC 12 LTR MEDI- (MISCELLANEOUS) ×2 IMPLANT
CANISTER SUCTION 1200CC (MISCELLANEOUS) IMPLANT
CANISTER SUCTION 2500CC (MISCELLANEOUS) IMPLANT
CANNULA ACUFLEX KIT 5X76 (CANNULA) IMPLANT
CLOTH BEACON ORANGE TIMEOUT ST (SAFETY) ×2 IMPLANT
CUTTER MENISCUS  4.2MM (BLADE)
CUTTER MENISCUS 4.2MM (BLADE) IMPLANT
DRAPE ARTHROSCOPY W/POUCH 114 (DRAPES) ×2 IMPLANT
DRSG EMULSION OIL 3X3 NADH (GAUZE/BANDAGES/DRESSINGS) ×2 IMPLANT
DRSG PAD ABDOMINAL 8X10 ST (GAUZE/BANDAGES/DRESSINGS) ×2 IMPLANT
DURAPREP 26ML APPLICATOR (WOUND CARE) ×2 IMPLANT
ELECT REM PT RETURN 9FT ADLT (ELECTROSURGICAL)
ELECTRODE REM PT RTRN 9FT ADLT (ELECTROSURGICAL) IMPLANT
GLOVE BIOGEL PI IND STRL 6.5 (GLOVE) ×1 IMPLANT
GLOVE BIOGEL PI IND STRL 7.0 (GLOVE) ×1 IMPLANT
GLOVE BIOGEL PI INDICATOR 6.5 (GLOVE) ×1
GLOVE BIOGEL PI INDICATOR 7.0 (GLOVE) ×1
GLOVE SURG SS PI 8.0 STRL IVOR (GLOVE) ×2 IMPLANT
GOWN PREVENTION PLUS LG XLONG (DISPOSABLE) ×2 IMPLANT
GOWN STRL REIN XL XLG (GOWN DISPOSABLE) ×2 IMPLANT
IV NS IRRIG 3000ML ARTHROMATIC (IV SOLUTION) ×4 IMPLANT
KNEE WRAP E Z 3 GEL PACK (MISCELLANEOUS) ×2 IMPLANT
MINI VAC (SURGICAL WAND) IMPLANT
NDL SAFETY ECLIPSE 18X1.5 (NEEDLE) ×2 IMPLANT
NEEDLE FILTER BLUNT 18X 1/2SAF (NEEDLE) ×1
NEEDLE FILTER BLUNT 18X1 1/2 (NEEDLE) ×1 IMPLANT
NEEDLE HYPO 18GX1.5 SHARP (NEEDLE) ×2
PACK ARTHROSCOPY DSU (CUSTOM PROCEDURE TRAY) ×2 IMPLANT
PACK BASIN DAY SURGERY FS (CUSTOM PROCEDURE TRAY) ×2 IMPLANT
PADDING CAST ABS 4INX4YD NS (CAST SUPPLIES) ×1
PADDING CAST ABS COTTON 4X4 ST (CAST SUPPLIES) ×1 IMPLANT
PADDING CAST COTTON 6X4 STRL (CAST SUPPLIES) ×2 IMPLANT
RESECTOR FULL RADIUS 4.2MM (BLADE) ×2 IMPLANT
SET ARTHROSCOPY TUBING (MISCELLANEOUS) ×1
SET ARTHROSCOPY TUBING LN (MISCELLANEOUS) ×1 IMPLANT
SPONGE GAUZE 4X4 12PLY (GAUZE/BANDAGES/DRESSINGS) ×2 IMPLANT
SUT ETHILON 4 0 PS 2 18 (SUTURE) ×2 IMPLANT
SYR 30ML LL (SYRINGE) ×2 IMPLANT
SYRINGE 10CC LL (SYRINGE) ×2 IMPLANT
TOWEL OR 17X24 6PK STRL BLUE (TOWEL DISPOSABLE) ×2 IMPLANT
WAND 90 DEG TURBOVAC W/CORD (SURGICAL WAND) IMPLANT
WATER STERILE IRR 500ML POUR (IV SOLUTION) ×2 IMPLANT

## 2012-05-01 NOTE — Anesthesia Procedure Notes (Signed)
Procedure Name: LMA Insertion Date/Time: 05/01/2012 7:53 AM Performed by: Jessica Priest Pre-anesthesia Checklist: Patient identified, Emergency Drugs available, Suction available and Patient being monitored Patient Re-evaluated:Patient Re-evaluated prior to inductionOxygen Delivery Method: Circle System Utilized Preoxygenation: Pre-oxygenation with 100% oxygen Intubation Type: IV induction Ventilation: Mask ventilation without difficulty LMA: LMA inserted LMA Size: 4.0 Number of attempts: 1 Airway Equipment and Method: bite block Placement Confirmation: positive ETCO2 Tube secured with: Tape Dental Injury: Teeth and Oropharynx as per pre-operative assessment

## 2012-05-01 NOTE — H&P (Signed)
Alex [name] is an 56 y.o. male.   Chief Complaint: left knee pain HPI: DJD meniscus tear left knee refractory  Past Medical History  Diagnosis Date  . Anxiety   . Hypertension   . Hyperlipidemia   . Immune deficiency disorder   . GERD (gastroesophageal reflux disease)   . Glaucoma BOTH EYES -- NO DROPS   . History of thyroid cancer PRIMARY (NO METS FROM ORBITAL CANCER)--   IN REMISSION    S/P TOTAL THYROIDECTOMY  , CHEMORADIATION  (ONCOLOGIST -- DR Arlice Colt)  . 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 Dysuria   . Prostatitis   . Epididymitis, left   . Renal calculi LEFT KIDNEY-- NON-OBSTRUCTIVE  . Depression   . Bipolar I disorder   . Complication of anesthesia POST URINARY RETENTION---  2006 SHOULDER SURGERY MARKED BRADYCARDIA VAGAL RESPONSE NO ISSUE W/ SURGERY AFTER THIS ONE  . BPH (benign prostatic hypertrophy) with urinary obstruction   . Positional vertigo   . Left flank pain   . Coronary atherosclerosis CARDIOLOGIST- DR CRENSHAW--  LAST VISIT 01-05-2012 IN [facility]    NON-OBSTRUCTIVE MILD DISEASE  . Urinary hesitancy   . Left knee pain CHONDRAL FLAP TEAR    Past Surgical History  Procedure Date  . Occuloplastic surgery 2002  . Total thyroidectomy 11-03-2001    PAPILLARY THYROID CARCINOMA  . Right supraomohyoid neck dissection  03-08-2003    ZONES 1,2,3;   SUBMANDIBULAR MASS / METASTATIC SQUAMOUS CELL CARCINOMA RIGHT NECK  . Pars plana vitrectomy 11-06-2004    RIGHT EYE RADIATION RETINOPATHY W/ HEMORRHAGE  . Left hydrocelectomy 03-29-2005    AND REPAIR LEFT INGUINAL HERNIA W/ MESH  . Nasal endoscopy 08-07-2005    RIGHT EPISTAXIS  / POST SEPTOPLASTY  (HX RIGHT ORBITAL CA & S/P RADIATION/ NECROSIS ANTERIOR END OF BOTH INFERIOR TURBINATES)  . Cardiac catheterization 01-16-2006  DR THOMAS WALL    MILD  CORONARY ATHEROSCLEROSIS/ MID TO DISTAL LAD 40% STENOSIS/ LVF 50-55%  . Shoulder arthroscopy w/ subacromial decompression and distal clavicle excision 10-09-2008    AND DEBRIDEMENT OF RIGHT SHOULDER IMPINGEMENT & [facility] JOINT ARTHRITIS  . Right ankle arthroscopy w/ extensive debridement 04-05-2008   . Left ankle arthroscopy w/ debridement 05-12-2007  . Mohs surgery 2002    RIGHT ORBITAL CANCER  . Repair undesended right testicle / right inguinal hernia AGE 27  . Septoplasty NOV 2006    Family History  Problem Relation Age of Onset  . Depression Sister   . Depression Daughter   . Drug abuse Daughter    Social History:  reports that he has never smoked. He has never used smokeless tobacco. He reports that he does not drink alcohol or use illicit drugs.  Allergies:  Allergies  Allergen Reactions  . Percocet (Oxycodone-Acetaminophen) Itching    Can take generic.  States only has a problem with percocet brand    Medications Prior to Admission  Medication Sig Dispense Refill  . amLODipine (NORVASC) 5 MG tablet Take 5 mg by mouth every evening.      [name] aspirin EC 81 MG tablet Take 1 tablet (81 mg total) by mouth daily.  30 tablet  11  . atorvastatin (LIPITOR) 40 MG tablet Take 40 mg by mouth at bedtime.      [name]  guaiFENesin (MUCINEX) 600 MG 12 hr tablet Take 600 mg by mouth 2 (two) times daily as needed. For congestion      . hydroxypropyl methylcellulose (ISOPTO TEARS) 2.5 % ophthalmic solution Place 1 drop into both eyes 4 (four) times daily as needed. For dry eyes      . Immune Globulin, Human, (HIZENTRA) 4 GM/20ML SOLN Inject into the skin once a week. wednesday      . lamoTRIgine (LAMICTAL) 150 MG tablet Take 300 mg by mouth every morning.      [name] levothyroxine (SYNTHROID) 150 MCG tablet Take 1 tablet (150 mcg total) by mouth at bedtime.  90 tablet  1  . LORazepam (ATIVAN) 0.5 MG tablet Take 0.5 mg by mouth at bedtime. Take 1 to 2 tab as need      . naproxen (NAPROSYN) 500 MG tablet Take  500 mg by mouth 2 (two) times daily as needed. For pain      . OXYCODONE HCL PO Take by mouth as needed.      . RABEprazole (ACIPHEX) 20 MG tablet Take 20 mg by mouth at bedtime.      . sulfamethoxazole-trimethoprim (BACTRIM DS) 800-160 MG per tablet Take 1 tablet by mouth 2 (two) times daily.      . Tamsulosin HCl (FLOMAX) 0.4 MG CAPS Take 0.4 mg by mouth as needed.      [name] telmisartan (MICARDIS) 80 MG tablet       . terbinafine (LAMISIL) 250 MG tablet Take 250 mg by mouth every morning.       . Testosterone (AXIRON) 30 MG/ACT SOLN Place 2 Act onto the skin every morning.      . zolpidem (AMBIEN) 10 MG tablet Take 0.5 tablets (5 mg total) by mouth at bedtime.  30 tablet  1  . meperidine (DEMEROL) 50 MG tablet Take 50 mg by mouth every 4 (four) hours as needed.        Results for orders placed during the hospital encounter of 05/01/12 (from the past 48 hour(s))  POCT I-STAT 4, (NA,K, GLUC, HGB,HCT)     Status: Abnormal   Collection Time   05/01/12  6:53 AM      Component Value Range Comment   Sodium 138  135 - 145 mEq/L    Potassium 4.2  3.5 - 5.1 mEq/L    Glucose, Bld 107 (*) 70 - 99 mg/dL    HCT 14.7 (*) 82.9 - 52.0 %    Hemoglobin 18.4 (*) 13.0 - 17.0 g/dL    No results found.  Review of Systems  Musculoskeletal: Positive for joint pain.  All other systems reviewed and are negative.    Blood pressure 124/89, pulse 80, temperature 97.4 F (36.3 C), temperature source Oral, resp. rate 18, height 6' (1.829 m), weight 78.926 kg (174 lb), SpO2 99.00%. Physical Exam  Vitals reviewed. Constitutional: He appears well-developed.  HENT:  Head: Normocephalic.  Eyes: Pupils are equal, round, and reactive to light.  Neck: Normal range of motion.  Cardiovascular: Normal rate.   Respiratory: Effort normal.  GI: Soft.  Musculoskeletal: Normal range of motion.       MJLT, PF pain left. +effusion. + Mcmurray  Neurological: He is alert.  Skin: Skin is warm.      Assessment/Plan Chondral flap tear, DJD, degenerative meniscus tear left knee. Plan arthroscopy. Risks discussed.  Malonie Tatum C 05/01/2012, 7:36 AM

## 2012-05-01 NOTE — Anesthesia Preprocedure Evaluation (Signed)
Anesthesia Evaluation  Patient identified by MRN, date of birth, ID band Patient awake    Reviewed: Allergy & Precautions, H&P , NPO status , Patient's Chart, lab work & pertinent test results, Unable to perform ROS - Chart review only  Airway Mallampati: II TM Distance: >3 FB Neck ROM: Full    Dental No notable dental hx.    Pulmonary neg pulmonary ROS,  breath sounds clear to auscultation  Pulmonary exam normal       Cardiovascular hypertension, Pt. on medications + CAD Rhythm:Regular Rate:Normal     Neuro/Psych Anxiety negative neurological ROS     GI/Hepatic Neg liver ROS, GERD-  Medicated,  Endo/Other  negative endocrine ROS  Renal/GU negative Renal ROS  negative genitourinary   Musculoskeletal negative musculoskeletal ROS (+)   Abdominal   Peds negative pediatric ROS (+)  Hematology negative hematology ROS (+)   Anesthesia Other Findings   Reproductive/Obstetrics negative OB ROS                           Anesthesia Physical Anesthesia Plan  ASA: III  Anesthesia Plan: General   Post-op Pain Management:    Induction: Intravenous  Airway Management Planned: LMA  Additional Equipment:   Intra-op Plan:   Post-operative Plan:   Informed Consent: I have reviewed the patients History and Physical, chart, labs and discussed the procedure including the risks, benefits and alternatives for the proposed anesthesia with the patient or authorized representative who has indicated his/her understanding and acceptance.   Dental advisory given  Plan Discussed with: CRNA and Surgeon  Anesthesia Plan Comments:         Anesthesia Quick Evaluation

## 2012-05-01 NOTE — Transfer of Care (Signed)
Immediate Anesthesia Transfer of Care Note  Patient: Alex [name]  Procedure(s) Performed: Procedure(s) (LRB): ARTHROSCOPY KNEE (Left)  Patient Location: PACU  Anesthesia Type: General  Level of Consciousness: awake, sedated, patient cooperative and responds to stimulation  Airway & Oxygen Therapy: Patient Spontanous Breathing and Patient connected to face mask oxygen  Post-op Assessment: Report given to PACU RN, Post -op Vital signs reviewed and stable and Patient moving all extremities  Post vital signs: Reviewed and stable  Complications: No apparent anesthesia complications

## 2012-05-01 NOTE — Anesthesia Postprocedure Evaluation (Signed)
  Anesthesia Post-op Note  Patient: Alex [name]  Procedure(s) Performed: Procedure(s) (LRB): ARTHROSCOPY KNEE (Left)  Patient Location: PACU  Anesthesia Type: General  Level of Consciousness: awake and alert   Airway and Oxygen Therapy: Patient Spontanous Breathing  Post-op Pain: mild  Post-op Assessment: Post-op Vital signs reviewed, Patient's Cardiovascular Status Stable, Respiratory Function Stable, Patent Airway and No signs of Nausea or vomiting  Post-op Vital Signs: stable  Complications: No apparent anesthesia complications

## 2012-05-01 NOTE — Brief Op Note (Signed)
05/01/2012  8:31 AM  PATIENT:  Alex [name]  56 y.o. male  PRE-OPERATIVE DIAGNOSIS:  CONDRAL FLAP TEAR  POST-OPERATIVE DIAGNOSIS:  CONDRAL FLAP TEAR  PROCEDURE:  Procedure(s) (LRB) with comments: ARTHROSCOPY KNEE (Left) - debridement and removal of loose body  SURGEON:  Surgeon(s) and Role:    * Javier Docker, MD - Primary  PHYSICIAN ASSISTANT:   ASSISTANTS: none   ANESTHESIA:   spinal  EBL:  Total I/O In: 1000 [I.V.:1000] Out: -   BLOOD ADMINISTERED:none  DRAINS: none   LOCAL MEDICATIONS USED:  MARCAINE     SPECIMEN:  No Specimen  DISPOSITION OF SPECIMEN:  N/A  COUNTS:  YES  TOURNIQUET:  * No tourniquets in log *  DICTATION: .Other Dictation: Dictation Number [id number]  PLAN OF CARE: Discharge to home after PACU  PATIENT DISPOSITION:  PACU - hemodynamically stable.   Delay start of Pharmacological VTE agent (>24hrs) due to surgical blood loss or risk of bleeding: no

## 2012-05-01 NOTE — Progress Notes (Signed)
Unable to void, bladder scan 349 ml, Dr. Shelle Iron paged and called back. Reported unable to void, order obtained to place foley catheter and patient to discontinue in am. See order.

## 2012-05-04 ENCOUNTER — Encounter (HOSPITAL_BASED_OUTPATIENT_CLINIC_OR_DEPARTMENT_OTHER): Payer: Self-pay | Admitting: Specialist

## 2012-05-04 ENCOUNTER — Ambulatory Visit (HOSPITAL_COMMUNITY): Payer: Self-pay | Admitting: Licensed Clinical Social Worker

## 2012-05-04 NOTE — Op Note (Signed)
NAMERHETT, MUTSCHLER              ACCOUNT NO.:  [id number]  MEDICAL RECORD NO.:  [id number]  LOCATION:                                FACILITY:  [facility]  PHYSICIAN:  Jene Every, [name]    DATE OF BIRTH:  1955/12/04  DATE OF PROCEDURE:  05/01/2012 DATE OF DISCHARGE:                              OPERATIVE REPORT   PREOPERATIVE DIAGNOSES: 1. Chondral flap tear. 2. Possible meniscus tear. 3. Degenerative joint disease of the left knee.  POSTOPERATIVE DIAGNOSES: 1. Grade 3 chondromalacia of the patella. 2. Grade 3 chondromalacia of the medial femoral condyle, chondral flap     tear. 3. Loose cartilaginous body in the lateral compartment. 4. Fraying in the anterior horn of lateral meniscus.  PROCEDURE PERFORMED: 1. Left knee arthroscopy. 2. Chondroplasty of the femoral condyle medially and of the patella. 3. Shaving of the anterior horn of the lateral meniscus. 4. Retrieval of loose body of the lateral compartment.  ANESTHESIA:  General.  ASSISTANT:  None.  HISTORY:  A 56 year old with locking, popping, giving way, history of arthroscopic debridement.  He had joint line tenderness medially, patellofemoral pain.  He had normal tracking.  Refractory to conservative treatment, corticosteroid injections, and home exercises. Indicated for arthroscopic debridement to evaluate for meniscus tear and possible chondral flap tear due to his mechanical symptoms.  Risk and benefits discussed including bleeding, infection, no change in symptoms, worsening symptoms, DVT, PE, anesthetic complications, etc.  TECHNIQUE:  With the patient in supine position, after induction of adequate general anesthesia, 2 g Kefzol and 900 clindamycin due to history of a cancer and chemo, left lower extremity was prepped and draped in usual sterile fashion.  A lateral parapatellar portal was fashioned with a #11 blade.  Ingress cannula atraumatically placed. Irrigant was utilized to insufflate the joint.  Under  direct visualization, a medial parapatellar portal was fashioned with a #11 blade after localization with 18-gauge needle sparing the medial meniscus.  Inspection of the medial compartment revealed extensive grade 3 changes of femoral condyle and chondral flap tear.  Almost grade 4 lesion of femoral condyle, 1 x 1 cm.  Otherwise, the tibial plateau showing just some minor grade 2 changes.  There was a small fraying and very small tear in the anterior horn of the lateral meniscus.  I introduced a 4.2 full radius resector and performed a chondroplasty of femoral condyle and shaved the anterior horn of the lateral meniscus. This was to a stable base.  Remnant of meniscus was stable to probe palpation.  ACL was unremarkable.  Lateral compartment revealed a loose body of 1 x 0.5 cm, this was evacuated by the shaver.  The meniscus was stable to probe palpation, no significant chondral defect of femoral condyle or tibial plateau.  Suprapatellar pouch revealed extensive grade 3 changes of the patella. There was no patellofemoral tracking.  Light chondroplasty was performed here.  Gutters were unremarkable.  I reexamined all compartments, there was no further pathology, amenable to arthroscopic intervention.  I therefore removed all instrumentations.  Portals were closed with 4-0 nylon simple sutures, 0.25% Marcaine with epinephrine was infiltrated in the joint, wounds dressed sterilely, awoken without difficulty and transported to  the recovery room in satisfactory condition.  The patient tolerated the procedure well.  No complications.  No assistant.  Minimal blood loss.     Jene Every, [name]     Cordelia Pen  D:  05/01/2012  T:  05/02/2012  Job:  [id number]

## 2012-05-18 ENCOUNTER — Ambulatory Visit (INDEPENDENT_AMBULATORY_CARE_PROVIDER_SITE_OTHER): Payer: BC Managed Care – PPO | Admitting: Psychiatry

## 2012-05-18 ENCOUNTER — Encounter (HOSPITAL_COMMUNITY): Payer: Self-pay | Admitting: Psychiatry

## 2012-05-18 VITALS — BP 141/100 | HR 78 | Wt 179.8 lb

## 2012-05-18 DIAGNOSIS — F319 Bipolar disorder, unspecified: Secondary | ICD-10-CM

## 2012-05-18 DIAGNOSIS — F39 Unspecified mood [affective] disorder: Secondary | ICD-10-CM

## 2012-05-18 MED ORDER — LORAZEPAM 0.5 MG PO TABS: 0.5000 mg | ORAL_TABLET | Freq: Every day | ORAL | Status: DC

## 2012-05-18 NOTE — Progress Notes (Signed)
Chief complaint September was a difficult month.  I flew up [place] to visit my sister who eventually died in hospice care.    History of presenting illness Patient came for his followup appointment.  He endorse increased anxiety and nervousness in past few weeks.  In September he flew [place] to visit last I'm his sister who had cancer .  When he returned she died in hospice care.  Soon after that father had a stroke.  He is recovering slowly.  Patient endorse increased anxiety and nervousness for past few weeks.  He's been not sleeping well.  He also has knee arthroscopy and he is taking multiple medication including pain medication.  He is recovering slowly.  He sleeping on and off.  He still concern about his son and daughter.  He endorse limited finances and limited social network.  His surgeon recommended to try amitriptyline which can help his chronic pain and neuropathy.  It is wondering if he can try amitriptyline for sleep pain and neuropathy.  At this time he is taking Ambien which works on and off.  Patient endorse multiple stress but feel his current psychiatric medication working.  He is reluctant to change his sleep medication but wants to give a try amitriptyline which has extra benefit of neuropathy pain patient denies any agitation anger or severe mood swing.  He denies any crying spells.  He did feel down and went into grief when her sister passed.  He is seeing therapist for coping and social skills.  He has some difficulty in walking after the knee surgery but slowly he is getting better.  He has a blood work done which shows normal liver function and kidney functions.  His hemoglobin A1c was 5.6.  His RBC was 6.13.  His TSH and vitamin D level was normal.  He denies any weight loss .  He is not drinking or using any illegal substance.  He denies any rash or itching.  Current psychiatric medication Lamictal 150 mg twice a day Ativan 0.5 mg 1-2 tablet as needed  Ambien 10 mg half tablet at  bedtime .  Past psychiatric illness Patient has been seeing in this office since 2009. He has history of depression mood swing. [facility]. At that time he was stressed about the finances and family issues. In the past he has taken Seroquel and Cymbalta.  Family history of psychiatric illness Patient's sister has alcohol issues and depression. One sister was admitted inpatient in [facility]  Alcohol and substance use history Patient denies any history of alcohol or substance use.  Medical history Patient has history of hypertension herpetic anemia, immune deficiency disorder, and GERD and tic disorder.  He is taking multiple medication for his health.  His primary care physician is Dr. Barnie Alderman.   Surgical history Patient has history of thyroidectomy, ankle surgery, shoulder surgery, oculoplastic surgery and orthopedic surgery.  Psychosocial history Patient lives with her wife and daughter. Her daughter lives in [place] area.  Lab results  his recent lab which was done in August were reviewed.  His hemoglobin A1c was 5.6 .      Mental status examination Patient is casually dressed and fairly groomed. He is anxious and described his mood  sat .  His affect is  constricted.    His his speech is fast with increased volume and tone but clear and coherent.  His thought processes are logical linear and goal-directed.  He denies any auditory or visual hallucination.  He denies any active or passive suicidal thoughts or homicidal thoughts.  His attention and concentration is fair.  There were no tremors or shakes present however he has tick.  There were no psychotic symptoms present.  His fund of knowledge is adequate.  He is alert and oriented x3.  His insight judgment and impulse control is okay.  Assessment Axis I mood disorder NOS, bipolar disorder 1 Axis II deferred Axis III see medical history Axis IV mild to  moderate Axis V 55-65  Plan I reviewed psychosocial stressors, medication, recent lab and previous notes.  Patient like to try amitriptyline.  I explain in detail the risk and benefits of medication including amitriptyline can cause but want side effects.  I recommend to discontinue Ambien as he will not require for sleep.  He will start amitriptyline 25 mg .  Prescription is provided by his doctor.  I recommend to call us if he is any question or concern if he feel worsening of the symptom.  I will provide Ativan 0.5 mg 1-2 tablet as needed #45.  He will see therapist for coping and social skills.   50% of the time spent on counseling and coordination of care.  I will see him again in 6 weeks.

## 2012-05-19 ENCOUNTER — Ambulatory Visit (INDEPENDENT_AMBULATORY_CARE_PROVIDER_SITE_OTHER): Payer: BC Managed Care – PPO | Admitting: *Deleted

## 2012-05-19 ENCOUNTER — Ambulatory Visit (INDEPENDENT_AMBULATORY_CARE_PROVIDER_SITE_OTHER): Payer: BC Managed Care – PPO | Admitting: Licensed Clinical Social Worker

## 2012-05-19 ENCOUNTER — Encounter (HOSPITAL_COMMUNITY): Payer: Self-pay | Admitting: Licensed Clinical Social Worker

## 2012-05-19 DIAGNOSIS — Z23 Encounter for immunization: Secondary | ICD-10-CM

## 2012-05-19 DIAGNOSIS — F3132 Bipolar disorder, current episode depressed, moderate: Secondary | ICD-10-CM

## 2012-05-19 NOTE — Progress Notes (Signed)
   THERAPIST PROGRESS NOTE  Session Time: 1:00pm-1:50pm  Participation Level: Active  Behavioral Response: Well GroomedAlertAnxious and Depressed  Type of Therapy: Individual Therapy  Treatment Goals addressed: Coping  Interventions: CBT, DBT, Motivational Interviewing, Supportive and Reframing  Summary: Alex [name] is a 56 y.o. male who presents with depressed mood and anxious affect. He reports increased stress related to his sisters death and his fathers stroke. He does not feel supported at home, remains angry with his wife for her lack of concern and compassion and continues to express frustration about their finances. He has not cried over his sisters death and feels that he is in disbelief and denial. He is talking to his siblings for support. He wants his children to support him and not to side with his wife when they argue. He remains isolated and has not developed a social network. He is recovering from knee surgery so he has been unable to exercise. His sleep is disrupted and his appetite is wnl.   Suicidal/Homicidal: Nowithout intent/plan  Therapist Response: Assessed patients current functioning and reviewed progress. Reviewed coping strategies. Assessed patients safety and assisted in identifying protective factors.  Reviewed crisis plan with patient. Assisted patient with the expression of his feelings of grief and anxiety. Processed and normalized patients grief reaction. Used CBT to assist patient with the identification of negative distortions and irrational thoughts. Encouraged patient to verbalize alternative and factual responses which challenge thought distortions. Used DBT to practice mindfulness, review distraction list and improve distress tolerance skills. Patient remains resistant to expanding his social network for fear of rejection. Used motivational interviewing to assist and encourage patient through the change process. Explored patients barriers to change.  Reviewed patients self care plan. Assessed  progress related to self care. Patient's self care is good. Recommend proper diet, regular exercise, socialization and recreation.   Plan: Return again in two weeks.  Diagnosis: Axis I: Bipolar, Depressed    Axis II: No diagnosis    Raymonda Pell, LCSW 05/19/2012

## 2012-06-05 ENCOUNTER — Telehealth: Payer: Self-pay | Admitting: *Deleted

## 2012-06-05 NOTE — Telephone Encounter (Signed)
Pt called to this RN to request a disc and reading of scans done 01/05/2012. Alex [name] went to his eye MD at [facility] and is being referred to another specialist ( Dr Adela Lank ?sp ) in [place] per noted history of vertigo.  This RN called radiology and requested disc and readings for pick up on Monday 10/28 per Deidre.

## 2012-06-23 ENCOUNTER — Other Ambulatory Visit (HOSPITAL_COMMUNITY): Payer: Self-pay | Admitting: Psychiatry

## 2012-06-23 DIAGNOSIS — F39 Unspecified mood [affective] disorder: Secondary | ICD-10-CM

## 2012-06-25 ENCOUNTER — Other Ambulatory Visit: Payer: Self-pay | Admitting: Cardiology

## 2012-06-25 MED ORDER — TELMISARTAN 80 MG PO TABS: 80.0000 mg | ORAL_TABLET | Freq: Every day | ORAL | Status: DC

## 2012-07-01 ENCOUNTER — Ambulatory Visit (INDEPENDENT_AMBULATORY_CARE_PROVIDER_SITE_OTHER): Payer: BC Managed Care – PPO | Admitting: Psychiatry

## 2012-07-01 ENCOUNTER — Encounter (HOSPITAL_COMMUNITY): Payer: Self-pay | Admitting: Psychiatry

## 2012-07-01 DIAGNOSIS — F39 Unspecified mood [affective] disorder: Secondary | ICD-10-CM

## 2012-07-01 DIAGNOSIS — F319 Bipolar disorder, unspecified: Secondary | ICD-10-CM

## 2012-07-01 MED ORDER — LAMOTRIGINE 150 MG PO TABS: 300.0000 mg | ORAL_TABLET | Freq: Every morning | ORAL | Status: DC

## 2012-07-01 MED ORDER — AMITRIPTYLINE HCL 10 MG PO TABS: 10.0000 mg | ORAL_TABLET | Freq: Every day | ORAL | Status: DC

## 2012-07-01 NOTE — Progress Notes (Signed)
Chief complaint Medication management and followup.    History of presenting illness Patient came for his followup appointment.  On his last visit recommend to try amitriptyline which was given by his primary care physician for groin pain however he has taken in the past was anxiety.  Patient tried amitriptyline 25 mg but did not tolerate the side effects.  He feels very groggy next morning.  Overall his anxiety is somewhat better.  He still has family issues.  He's concerned about his daughter who live [place].  Today he is coming from his primary care physician who has given is deferred injection for his knee pain.  Patient is compliant with his Lamictal.  He is still take lorazepam and Ambien for his anxiety and insomnia.  He is taking pain medication for his knee pain.  He denies any agitation anger mood swing.  He still has anxiety but he denies any aggression or violence.  He's not drinking or using any illegal substance.  Current psychiatric medication Lamictal 150 mg twice a day Ativan 0.5 mg 1-2 tablet as needed  Ambien 10 mg half tablet at bedtime .  Past psychiatric illness Patient has been seeing in this office since 2009. He has history of depression mood swing. [facility]. At that time he was stressed about the finances and family issues. In the past he has taken Seroquel and Cymbalta.  Family history of psychiatric illness Patient's sister has alcohol issues and depression. One sister was admitted inpatient in [facility]  Alcohol and substance use history Patient denies any history of alcohol or substance use.  Medical history Patient has history of hypertension herpetic anemia, immune deficiency disorder, and GERD and tic disorder.  He is taking multiple medication for his health.  His primary care physician is Dr. Barnie Alderman.   Surgical history Patient has history of thyroidectomy, ankle surgery, shoulder  surgery, oculoplastic surgery and orthopedic surgery.  Psychosocial history Patient lives with her wife and daughter. Her daughter lives in [place] area.  Mental status examination Patient is casually dressed and fairly groomed. He is cooperative and maintained good eye contact.  He described his mood is anxious and his affect is mood appropriate.  His speech is fast with increased volume and tone but clear and coherent.  His thought processes are logical linear and goal-directed.  He denies any auditory or visual hallucination.  He denies any active or passive suicidal thoughts or homicidal thoughts.  His attention and concentration is fair.  There were no tremors or shakes present however he has tick.  There were no psychotic symptoms present.  His fund of knowledge is adequate.  He is alert and oriented x3.  His insight judgment and impulse control is okay.  Assessment Axis I mood disorder NOS, bipolar disorder 1 Axis II deferred Axis III see medical history Axis IV mild to moderate Axis V 55-65  Plan I recommend to try amitriptyline 10 mg to help his anxiety and insomnia.  I also told that he takes amitriptyline he should not take Ambien with amitriptyline.  Continue his Lamictal .  He denies any rash or itching.  I encourage her to see therapist for coping and social skills.  I will see him again in 6 weeks.

## 2012-07-14 ENCOUNTER — Telehealth (HOSPITAL_COMMUNITY): Payer: Self-pay | Admitting: Psychiatry

## 2012-07-14 ENCOUNTER — Other Ambulatory Visit (HOSPITAL_COMMUNITY): Payer: Self-pay | Admitting: Psychiatry

## 2012-07-14 ENCOUNTER — Ambulatory Visit (INDEPENDENT_AMBULATORY_CARE_PROVIDER_SITE_OTHER): Payer: BC Managed Care – PPO | Admitting: Licensed Clinical Social Worker

## 2012-07-14 ENCOUNTER — Encounter (HOSPITAL_COMMUNITY): Payer: Self-pay | Admitting: Licensed Clinical Social Worker

## 2012-07-14 DIAGNOSIS — F39 Unspecified mood [affective] disorder: Secondary | ICD-10-CM

## 2012-07-14 DIAGNOSIS — F3132 Bipolar disorder, current episode depressed, moderate: Secondary | ICD-10-CM

## 2012-07-14 MED ORDER — LORAZEPAM 0.5 MG PO TABS: 0.5000 mg | ORAL_TABLET | Freq: Two times a day (BID) | ORAL | Status: DC | PRN

## 2012-07-14 NOTE — Telephone Encounter (Signed)
Message copied by Cleotis Nipper on Tue Jul 14, 2012 10:42 AM ------      Message from: Remus Loffler      Created: Tue Jul 14, 2012 10:19 AM      Regarding: Domonique Brouillard      Contact: [phone]       Met with [name] this morning. He reports increased anxiety, depression, and irritability with passive suicidal ideation. He is open to discussing a possible increase in Lamicital if appropriate at this time. Please call him with direction.
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
## 2012-07-14 NOTE — Telephone Encounter (Signed)
Spoke to patient.  Patient endorse increased anxiety in past few days.  He is taking Ativan 0.5 mg at bedtime and sometime he takes a second one.  He is taking Lamictal 300 mg.  I recommend to take Ativan twice a day for a short time until his anxiety get resolved.  I will write a new prescription 0.5 mg Ativan twice a day as needed.  I recommend to call us if he feel the still anxious or worsening of the symptoms.

## 2012-07-16 ENCOUNTER — Ambulatory Visit (HOSPITAL_COMMUNITY): Payer: Self-pay | Admitting: Licensed Clinical Social Worker

## 2012-07-18 ENCOUNTER — Telehealth: Payer: Self-pay | Admitting: Physician Assistant

## 2012-07-18 NOTE — Telephone Encounter (Signed)
Pt called to report intermittent palpitations. He has noticed them mostly at night, but also has had a few episodes during the day. He feels like his heart is flipping and also relates it to being similar to his heart "twitching" for a few seconds. Has h/o PVCs and CAD. He denies any CP, SOB, DOE, LEE, syncope/presyncope or any other symptoms except some anxiety (related to holidays, upcoming possible tendon surgery). These palpitations have been going on for quite some time. I advised that if he begins to feel this more frequent or worsening in nature, or has any associated symptoms concerning to him, he should proceed to ER for evaluation and not drive himself. Otherwise if his symptoms remain stable as they have been the past few weeks, he was instructed to call the office on Monday 12/9 to schedule f/u appt and/or event monitor. He verbalized understanding and gratitude.

## 2012-07-22 ENCOUNTER — Telehealth: Payer: Self-pay | Admitting: Cardiology

## 2012-07-22 NOTE — Telephone Encounter (Signed)
Left message for pt, all his labs were checked by dr plotnikov in August this year. As far as we are concerned right now there is no need for lab work prior to appt. Pt to call if he has concerns or questions.

## 2012-07-22 NOTE — Telephone Encounter (Signed)
plz return call to pt [phone] regarding fasting lab order for upcoming appnt.

## 2012-07-23 ENCOUNTER — Telehealth: Payer: Self-pay | Admitting: Cardiology

## 2012-07-23 ENCOUNTER — Ambulatory Visit: Payer: Self-pay | Admitting: Physician Assistant

## 2012-07-23 DIAGNOSIS — E7800 Pure hypercholesterolemia, unspecified: Secondary | ICD-10-CM

## 2012-07-23 NOTE — Telephone Encounter (Signed)
Spoke with pt, he needs another lipid profile for his job. Order placed for [facility].

## 2012-07-23 NOTE — Telephone Encounter (Signed)
Follow-up:    Patient returned your call.  Please call back. 

## 2012-07-25 ENCOUNTER — Other Ambulatory Visit: Payer: Self-pay | Admitting: Cardiology

## 2012-07-25 ENCOUNTER — Other Ambulatory Visit (HOSPITAL_COMMUNITY): Payer: Self-pay | Admitting: Psychiatry

## 2012-07-25 DIAGNOSIS — F39 Unspecified mood [affective] disorder: Secondary | ICD-10-CM

## 2012-07-27 ENCOUNTER — Telehealth: Payer: Self-pay | Admitting: Cardiology

## 2012-07-27 ENCOUNTER — Ambulatory Visit: Payer: Self-pay | Admitting: Physician Assistant

## 2012-07-27 ENCOUNTER — Other Ambulatory Visit (HOSPITAL_COMMUNITY): Payer: Self-pay | Admitting: Psychiatry

## 2012-07-27 ENCOUNTER — Other Ambulatory Visit (INDEPENDENT_AMBULATORY_CARE_PROVIDER_SITE_OTHER): Payer: BC Managed Care – PPO

## 2012-07-27 DIAGNOSIS — E7800 Pure hypercholesterolemia, unspecified: Secondary | ICD-10-CM

## 2012-07-27 LAB — HEPATIC FUNCTION PANEL
ALT: 36 U/L (ref 0–53)
AST: 21 U/L (ref 0–37)
Albumin: 4 g/dL (ref 3.5–5.2)
Alkaline Phosphatase: 46 U/L (ref 39–117)
Bilirubin, Direct: 0.1 mg/dL (ref 0.0–0.3)
Total Bilirubin: 1 mg/dL (ref 0.3–1.2)
Total Protein: 6.8 g/dL (ref 6.0–8.3)

## 2012-07-27 LAB — LIPID PANEL
Cholesterol: 162 mg/dL (ref 0–200)
HDL: 74.4 mg/dL (ref >39.00)
LDL Cholesterol: 78 mg/dL (ref 0–99)
Total CHOL/HDL Ratio: 2
Triglycerides: 50 mg/dL (ref 0.0–149.0)
VLDL: 10 mg/dL (ref 0.0–40.0)

## 2012-07-27 NOTE — Telephone Encounter (Signed)
New Problem:    Patient called in wanting to know if he could add a liver profile added to the labs that he is having drawn today.  Please call back if you have any questions.

## 2012-07-27 NOTE — Telephone Encounter (Signed)
Liver profile added to blood work. The pt is on a statin.

## 2012-07-28 NOTE — Telephone Encounter (Signed)
Contacted pt at Dr.Arfeen's request.Informed him Dr.Arfeen is prescribing Ambien 5 mg,take only as needed for sleep.Continue taking Amitriptyline 10 mg at bedtime. Pt states he is not currently taking Amitriptyline. States he had a residual 'tiredness' the next day and that it caused strange dreams, but that he would try again.

## 2012-07-29 ENCOUNTER — Encounter: Payer: Self-pay | Admitting: *Deleted

## 2012-07-30 ENCOUNTER — Ambulatory Visit (HOSPITAL_COMMUNITY): Payer: Self-pay | Admitting: Licensed Clinical Social Worker

## 2012-07-31 ENCOUNTER — Ambulatory Visit (INDEPENDENT_AMBULATORY_CARE_PROVIDER_SITE_OTHER): Payer: BC Managed Care – PPO | Admitting: Licensed Clinical Social Worker

## 2012-07-31 ENCOUNTER — Encounter (HOSPITAL_COMMUNITY): Payer: Self-pay | Admitting: Licensed Clinical Social Worker

## 2012-07-31 DIAGNOSIS — F3132 Bipolar disorder, current episode depressed, moderate: Secondary | ICD-10-CM
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
## 2012-08-03 ENCOUNTER — Encounter: Payer: Self-pay | Admitting: Physician Assistant

## 2012-08-03 ENCOUNTER — Other Ambulatory Visit: Payer: Self-pay | Admitting: *Deleted

## 2012-08-03 ENCOUNTER — Ambulatory Visit (INDEPENDENT_AMBULATORY_CARE_PROVIDER_SITE_OTHER): Payer: BC Managed Care – PPO | Admitting: Physician Assistant

## 2012-08-03 VITALS — BP 118/98 | HR 91 | Ht 72.0 in | Wt 177.2 lb

## 2012-08-03 DIAGNOSIS — E785 Hyperlipidemia, unspecified: Secondary | ICD-10-CM

## 2012-08-03 DIAGNOSIS — E291 Testicular hypofunction: Secondary | ICD-10-CM

## 2012-08-03 DIAGNOSIS — I2510 Atherosclerotic heart disease of native coronary artery without angina pectoris: Secondary | ICD-10-CM

## 2012-08-03 DIAGNOSIS — I10 Essential (primary) hypertension: Secondary | ICD-10-CM

## 2012-08-03 DIAGNOSIS — R002 Palpitations: Secondary | ICD-10-CM

## 2012-08-03 DIAGNOSIS — R7309 Other abnormal glucose: Secondary | ICD-10-CM

## 2012-08-03 DIAGNOSIS — R0602 Shortness of breath: Secondary | ICD-10-CM

## 2012-08-03 DIAGNOSIS — Z0000 Encounter for general adult medical examination without abnormal findings: Secondary | ICD-10-CM

## 2012-08-03 MED ORDER — TELMISARTAN-HCTZ 40-12.5 MG PO TABS: 1.0000 | ORAL_TABLET | Freq: Every day | ORAL | Status: DC

## 2012-08-03 NOTE — Patient Instructions (Addendum)
Your physician has recommended you make the following change in your medication:   DISCONTINUE YOUR AMLODIPINE AND MICARDIS START MICARDIS/HCT 40/12.5 DAILY, LAB NEED to be done 1 week after starting Micardis/hct   NEED TO SCHEDULE 48 HOUR HOLTER MONITER FOR PALPITATION  NEED TO SCHEDULE A STRESS TOLERANCE TEST (GXT) FOR SOB  NEED 6 MONTHS FOLLOW UP WITH  DR. CRENSHAW   LABS TOMORROW: TSH/BMET/FASTING GLUCOSE,TESTOSTERONE LEVEL

## 2012-08-03 NOTE — Progress Notes (Signed)
Placed a 48 hr holter monitor on patient and went over instruction on how to use and when to return the monitor.

## 2012-08-03 NOTE — Progress Notes (Signed)
Patient ID: Alex [name], male   DOB: 1955-09-03, 56 y.o.   MRN: [id number] Placed a 48 hr monitor on patient 08/03/12. Also went over the instructions on how to use the monitor and when to return it

## 2012-08-03 NOTE — Progress Notes (Addendum)
[address] [place], [name]  [address] Phone: [phone], Fax:  [phone]  Date:  08/03/2012   Name:  Alex [name]   DOB:  July 25, 1956   MRN:  [id number]  PCP:  Sonda Primes, MD  Primary Cardiologist:  Dr. Olga Millers  Primary Electrophysiologist:  None    History of Present Illness: Alex [name] is a 56 y.o. male returns for followup.  He has a hx of CAD, HTN, HL, GERD, prior thyroid cancer, bipolar disorder, DJD, CVID (common variable immunodeficiency). LHC 6/07 mid LAD 40%, EF 50-55%. ETT 7/11: Exercised for 10 minutes, normal study. Echo 12/12: EF 55-60%, grade 1 diastolic dysfunction. Last seen by Dr. Jens Som 12/12.  Patient presents today with a health screening form that needs to be completed with cholesterol, blood pressure and glucose values. The form was completed today. He had a recent EGD 05/2012 notable for Candida esophagitis. He is currently on Diflucan. Patient has occasional heart "flutters".  These have been occurring for the last 1-2 months. They last a few seconds. He denies any associated lightheadedness, dizziness, chest pain or shortness of breath. He does have difficulty with vertigo. He has seen ENT in the past. He does note increased dyspnea with exertion. He probably describes class II-IIb symptoms. He denies orthopnea, PND or edema. He denies chest discomfort or exertional chest pain. He does note increased stress. He denies any excessive caffeine intake. He denies any over-the-counter cold medication use.  Labs (8/13):     K 4.2, creatinine 1.0 Labs (12/13):   HDL 74.4, LDL 78, ALT 36  Wt Readings from Last 3 Encounters:  08/03/12 177 lb 3.2 oz (80.377 kg)  05/18/12 179 lb 12.8 oz (81.557 kg)  05/01/12 174 lb (78.926 kg)     Past Medical History  Diagnosis Date  . Anxiety   . Hypertension   . Hyperlipidemia   . Immune deficiency disorder   . GERD (gastroesophageal reflux disease)   . Glaucoma BOTH EYES -- NO  DROPS   . History of thyroid cancer PRIMARY (NO METS FROM ORBITAL CANCER)--   IN REMISSION    S/P TOTAL THYROIDECTOMY  , CHEMORADIATION  (ONCOLOGIST -- DR Arlice Colt)  . 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 Dysuria   . Prostatitis   . Epididymitis, left   . Renal calculi LEFT KIDNEY-- NON-OBSTRUCTIVE  . Depression   . Bipolar I disorder   . Complication of anesthesia POST URINARY RETENTION---  2006 SHOULDER SURGERY MARKED BRADYCARDIA VAGAL RESPONSE NO ISSUE W/ SURGERY AFTER THIS ONE  . BPH (benign prostatic hypertrophy) with urinary obstruction   . Positional vertigo   . Left flank pain   . Coronary atherosclerosis CARDIOLOGIST- DR CRENSHAW--  LAST VISIT 01-05-2012 IN [facility]    NON-OBSTRUCTIVE MILD DISEASE  . Urinary hesitancy   . Left knee pain CHONDRAL FLAP TEAR    Current Outpatient Prescriptions  Medication Sig Dispense Refill  . amitriptyline (ELAVIL) 10 MG tablet Take 1 tablet (10 mg total) by mouth at bedtime.  30 tablet  1  . amLODipine (NORVASC) 5 MG tablet Take 5 mg by mouth every evening.      [name] atorvastatin (LIPITOR) 40 MG tablet Take 40 mg by mouth at bedtime.      [name]  guaiFENesin (MUCINEX) 600 MG 12 hr tablet Take 600 mg by mouth 2 (two) times daily as needed. For congestion      . hydroxypropyl methylcellulose (ISOPTO TEARS) 2.5 % ophthalmic solution Place 1 drop into both eyes 4 (four) times daily as needed. For dry eyes      . Immune Globulin, Human, (HIZENTRA) 4 GM/20ML SOLN Inject into the skin once a week. wednesday      . lamoTRIgine (LAMICTAL) 150 MG tablet Take 2 tablets (300 mg total) by mouth every morning.  30 tablet  1  . levothyroxine (SYNTHROID) 150 MCG tablet Take 1 tablet (150 mcg total) by mouth at bedtime.  90 tablet  1  . LORazepam (ATIVAN) 0.5 MG tablet Take 1 tablet (0.5 mg total) by mouth  2 (two) times daily as needed for anxiety.  60 tablet  0  . MICARDIS 80 MG tablet TAKE 1 TABLET EVERY DAY  30 tablet  0  . naproxen (NAPROSYN) 500 MG tablet Take 500 mg by mouth 2 (two) times daily as needed. For pain      . oxyCODONE-acetaminophen (PERCOCET) 5-325 MG per tablet Take 1 tablet by mouth every 4 (four) hours as needed for pain.  40 tablet  0  . RABEprazole (ACIPHEX) 20 MG tablet Take 20 mg by mouth at bedtime.      . Tamsulosin HCl (FLOMAX) 0.4 MG CAPS Take 0.4 mg by mouth as needed.      . terbinafine (LAMISIL) 250 MG tablet Take 250 mg by mouth every morning.       . Testosterone (AXIRON) 30 MG/ACT SOLN Place 2 Act onto the skin every morning.      . meperidine (DEMEROL) 50 MG tablet Take 50 mg by mouth every 4 (four) hours as needed.      . sulfamethoxazole-trimethoprim (BACTRIM DS) 800-160 MG per tablet Take 1 tablet by mouth 2 (two) times daily.      [name] zolpidem (AMBIEN) 5 MG tablet Take 1 tablet (5 mg total) by mouth at bedtime as needed.  15 tablet  0    Allergies: Allergies  Allergen Reactions  . Percocet (Oxycodone-Acetaminophen) Itching    Can take generic.  States only has a problem with percocet brand    Social History:  The patient  reports that he has never smoked. He has never used smokeless tobacco. He reports that he does not drink alcohol or use illicit drugs.   ROS:  Please see the history of present illness.      All other systems reviewed and negative.   PHYSICAL EXAM: VS:  BP 118/98  Pulse 91  Ht 6' (1.829 m)  Wt 177 lb 3.2 oz (80.377 kg)  BMI 24.03 kg/m2 Well nourished, well developed, in no acute distress HEENT: normal Neck: no JVD Vascular: No carotid bruits  Cardiac:  normal S1, S2; RRR; no murmur Lungs:  clear to auscultation bilaterally, no wheezing, rhonchi or rales Abd: soft, nontender, no hepatomegaly Ext: no edema Skin: warm and dry Neuro:  CNs 2-12 intact, no focal abnormalities noted  EKG:  NSR, HR 91, normal axis, inferior Q  waves, no change from prior tracing     ASSESSMENT AND PLAN:  1.  Palpitations:   I suspect he is experiencing PACs or PVCs. Check a basic metabolic panel and TSH. Arrange a 48 hour Holter monitor. If this is unrevealing and he continues to have symptoms, we may need to consider an event monitor.  2. Dyspnea:   He  seems to think his dyspnea with exertion is getting worse. He denies chest discomfort. He did have non-obstructive disease at cardiac catheterization a few years ago. He also had a negative treadmill test in 2011. I will arrange a routine exercise treadmill test to exclude ischemia as a cause for his symptoms.  3. Hypertension:   Diastolic reading is somewhat uncontrolled. He is not taking amlodipine. I have suggested that he change his Micardis to Micardis/HCT 40/12.5 mg daily. He will need a basic metabolic panel one week after starting the medication.  4. Hyperlipidemia:   Continue Lipitor. Recent lipid panel optimal.  5. Coronary Artery Disease:  Continue ASA and statin.  6. Hypogonadism:  Patient requests a Testosterone level with lab work.  We will have the results sent to his urologist.  7. Disposition:   Followup with Dr. Jens Som in 6 months.     Signed, Tereso Newcomer, PA-C  12:19 PM 08/03/2012

## 2012-08-03 NOTE — Progress Notes (Signed)
Patient ID: Alex [name], male   DOB: 30-May-1956, 56 y.o.   MRN: [id number] error

## 2012-08-04 ENCOUNTER — Other Ambulatory Visit (INDEPENDENT_AMBULATORY_CARE_PROVIDER_SITE_OTHER): Payer: BC Managed Care – PPO

## 2012-08-04 DIAGNOSIS — Z0000 Encounter for general adult medical examination without abnormal findings: Secondary | ICD-10-CM

## 2012-08-04 DIAGNOSIS — R0602 Shortness of breath: Secondary | ICD-10-CM

## 2012-08-04 DIAGNOSIS — I2510 Atherosclerotic heart disease of native coronary artery without angina pectoris: Secondary | ICD-10-CM

## 2012-08-04 DIAGNOSIS — I10 Essential (primary) hypertension: Secondary | ICD-10-CM

## 2012-08-04 DIAGNOSIS — R002 Palpitations: Secondary | ICD-10-CM

## 2012-08-04 DIAGNOSIS — R7309 Other abnormal glucose: Secondary | ICD-10-CM

## 2012-08-04 LAB — BASIC METABOLIC PANEL
BUN: 22 mg/dL (ref 6–23)
CO2: 26 mEq/L (ref 19–32)
Calcium: 8.3 mg/dL — ABNORMAL LOW (ref 8.4–10.5)
Chloride: 100 mEq/L (ref 96–112)
Creatinine, Ser: 1.2 mg/dL (ref 0.4–1.5)
GFR: 68.57 mL/min (ref >60.00)
Glucose, Bld: 92 mg/dL (ref 70–99)
Potassium: 4.4 mEq/L (ref 3.5–5.1)
Sodium: 136 mEq/L (ref 135–145)

## 2012-08-04 LAB — TSH: TSH: 6.76 u[IU]/mL — ABNORMAL HIGH (ref 0.35–5.50)

## 2012-08-04 LAB — HEMOGLOBIN A1C: Hgb A1c MFr Bld: 6.1 % (ref 4.6–6.5)

## 2012-08-04 LAB — TESTOSTERONE: Testosterone: 595.48 ng/dL (ref 350.00–890.00)

## 2012-08-06 ENCOUNTER — Other Ambulatory Visit: Payer: Self-pay | Admitting: *Deleted

## 2012-08-06 MED ORDER — TELMISARTAN-HCTZ 40-12.5 MG PO TABS: 1.0000 | ORAL_TABLET | Freq: Every day | ORAL | Status: DC

## 2012-08-07 ENCOUNTER — Telehealth: Payer: Self-pay | Admitting: *Deleted

## 2012-08-07 NOTE — Telephone Encounter (Signed)
lmptcb to go over lab results and recommendations 

## 2012-08-07 NOTE — Telephone Encounter (Signed)
Message copied by Tarri Fuller on Fri Aug 07, 2012  9:12 AM ------      Message from: [place], [place] T      Created: Tue Aug 04, 2012  1:20 PM       BMET ok      TSH high => follow up with PCP for adjustment in rx.  Fax labs to PCP.      Testosterone high => follow up with urologist for adjustments in Rx.  Fax labs to urologist.      Tereso Newcomer, PA-C  1:20 PM 08/04/2012

## 2012-08-07 NOTE — Telephone Encounter (Signed)
pt notified about lab results and to f/u w/PCP for elevated TSH and f/u w/Urologist for elevated Testosterone, pt verbalized understanding today. Pt asked for results to mailed to him, I will mail today per pt request.

## 2012-08-07 NOTE — Telephone Encounter (Signed)
Follow-up:    Patient returned your call.  Please call back. 

## 2012-08-07 NOTE — Telephone Encounter (Signed)
lmom make sure pt is taking  ASA 81 mg daily and if he not yet started then to start today

## 2012-08-10 ENCOUNTER — Telehealth: Payer: Self-pay

## 2012-08-10 ENCOUNTER — Other Ambulatory Visit: Payer: Self-pay

## 2012-08-10 NOTE — Telephone Encounter (Signed)
Called pt to verify previous BP meds. Prior authorization needed my [employer] to approve Micardis HCT. They want pt to try a 30 day supply of generic before they will authorize.made pt aware of this

## 2012-08-11 ENCOUNTER — Encounter (INDEPENDENT_AMBULATORY_CARE_PROVIDER_SITE_OTHER): Payer: BC Managed Care – PPO

## 2012-08-11 ENCOUNTER — Telehealth: Payer: Self-pay | Admitting: Cardiology

## 2012-08-11 ENCOUNTER — Ambulatory Visit (INDEPENDENT_AMBULATORY_CARE_PROVIDER_SITE_OTHER): Payer: BC Managed Care – PPO | Admitting: Licensed Clinical Social Worker

## 2012-08-11 DIAGNOSIS — R0989 Other specified symptoms and signs involving the circulatory and respiratory systems: Secondary | ICD-10-CM

## 2012-08-11 DIAGNOSIS — F3132 Bipolar disorder, current episode depressed, moderate: Secondary | ICD-10-CM

## 2012-08-11 NOTE — Progress Notes (Signed)
Patient ID: Alex [name], male   DOB: 07-14-1956, 56 y.o.   MRN: [id number] Redone patient Holter monitor the first one did not work

## 2012-08-11 NOTE — Telephone Encounter (Signed)
Patient returning call to Danielle Rankin, he can be reached at [phone]

## 2012-08-11 NOTE — Progress Notes (Signed)
   THERAPIST PROGRESS NOTE  Session Time: 1:00pm-1:50pm  Participation Level: Active  Behavioral Response: Well GroomedAlertAnxious and Depressed  Type of Therapy: Individual Therapy  Treatment Goals addressed: Anxiety and Coping  Interventions: CBT, DBT, Motivational Interviewing, Strength-based, Supportive and Reframing  Summary: Alex [name] is a 56 y.o. male who presents with depressed mood and anxious affect. He reports ongoing stress related to his family and the holidays. He is upset with his children and his wife for blaming him for problems in the family and problems with individual children. He is defensive and upset that he is painted as "the terrible father". He feels this is unfair and does not follow through on setting limits because he does not want to be further disliked. He remains angry over the financial stress he is under because his children do not contribute and pay their car insurance and cell phone bills. He wants his wife and children to understand his stress, but they do not. He feels unsupported and isolated. He had an argument with his wife and he left the house to calm down. This was a result of being blamed for his youngest daughters SIB. He felt suicidal that night, but did not hurt himself and returned home. His children are a protective factor for him and he clearly expresses a desire not to harm them through suicide. His sleep is disrupted and his appetite is wnl.    Suicidal/Homicidal: Nowithout intent/plan  Therapist Response: Assessed patients current functioning and reviewed progress. Reviewed coping strategies. Assessed patients safety and assisted in identifying protective factors.  Reviewed crisis plan with patient. Assisted patient with the expression of his feelings of frustration and anxeity. Patients thoughts about his family are distorted and irrational. He is highly defensive about the negative consequences of marital conflict on his children.  Used CBT to assist patient with the identification of negative distortions and irrational thoughts. Encouraged patient to verbalize alternative and factual responses which challenge thought distortions. Used DBT to practice mindfulness, review distraction list and improve distress tolerance skills. Challenged his continued enabling of his children as a means to avoid further conflict and anxiety. Used motivational interviewing to assist and encourage patient through the change process. Explored patients barriers to change. Reviewed patients self care plan. Assessed  progress related to self care. Patient's self care is fair. Recommend proper diet, regular exercise, socialization and recreation. Patient clearly commits to safety and agrees to go to the hospital if he becomes unsafe.   Plan: Return again in two weeks.  Diagnosis: Axis I: Bipolar, Depressed    Axis II: No diagnosis    Jeramyah Goodpasture, LCSW 08/11/2012

## 2012-08-11 NOTE — Progress Notes (Unsigned)
Patient ID: Alex [name], male   DOB: 1956/07/25, 56 y.o.   MRN: [id number] This is a redirect for the last note The patient did come back to the office for another 48 hr holter monitor. The patient did not need an additional monitor. i called him and asked him to return the 48 hr holter monitor back. i did find his report and downloaded it to the computer.

## 2012-08-15 ENCOUNTER — Other Ambulatory Visit: Payer: Self-pay | Admitting: Internal Medicine

## 2012-08-17 ENCOUNTER — Telehealth: Payer: Self-pay | Admitting: Cardiology

## 2012-08-17 ENCOUNTER — Telehealth (HOSPITAL_COMMUNITY): Payer: Self-pay | Admitting: *Deleted

## 2012-08-17 ENCOUNTER — Other Ambulatory Visit: Payer: Self-pay | Admitting: *Deleted

## 2012-08-17 ENCOUNTER — Telehealth: Payer: Self-pay

## 2012-08-17 DIAGNOSIS — I10 Essential (primary) hypertension: Secondary | ICD-10-CM

## 2012-08-17 DIAGNOSIS — F319 Bipolar disorder, unspecified: Secondary | ICD-10-CM

## 2012-08-17 DIAGNOSIS — F39 Unspecified mood [affective] disorder: Secondary | ICD-10-CM

## 2012-08-17 MED ORDER — LOSARTAN POTASSIUM-HCTZ 50-12.5 MG PO TABS: 1.0000 | ORAL_TABLET | Freq: Every day | ORAL | Status: DC

## 2012-08-17 MED ORDER — ASPIRIN EC 81 MG PO TBEC: 81.0000 mg | DELAYED_RELEASE_TABLET | Freq: Every day | ORAL | Status: DC

## 2012-08-17 NOTE — Telephone Encounter (Signed)
Spoke with pt advised him of Rx Hyzaar 50/12.5 was sent into [facility] today. Advised pt of bmet lab scheduled 08/31/12.

## 2012-08-17 NOTE — Telephone Encounter (Signed)
Pt wants a call today regarding medication he wants to know about generic meds

## 2012-08-17 NOTE — Telephone Encounter (Signed)
Has 7 days left of current Lamictal prescription. Has been reading new insurance benefits, and wants to change to 90 day supply.  Can get 90 day supply Lamictal ER 300mg , one daily  for only $20. Wants to know if he could be changed from Lamictal 150 mg, take 2 daily, to Lamictal ER 300mg , one daily, to save money.

## 2012-08-17 NOTE — Telephone Encounter (Signed)
lisa CMA called pt to advise of med change and bmet 08/31/12

## 2012-08-18 MED ORDER — LAMOTRIGINE ER 300 MG PO TB24: 300.0000 mg | ORAL_TABLET | Freq: Every day | ORAL | Status: DC

## 2012-08-18 NOTE — Addendum Note (Signed)
Addended by: Kathryne Sharper T on: 08/18/2012 09:32 AM   Modules accepted: Orders

## 2012-08-18 NOTE — Telephone Encounter (Signed)
Patient called.  He wanted to try Lamictal extended-release 300 mg daily.  Which is available on his description plan.  He likes 90 day supply.  I will discontinue Lamictal 150 mg twice a day.  We will order Lamictal extended-release 300 mg daily.  I explain in risk and benefits of medication especially rash in that case she need to stop the medication immediately.  Patient acknowledged.

## 2012-08-20 ENCOUNTER — Other Ambulatory Visit (HOSPITAL_COMMUNITY): Payer: Self-pay | Admitting: Psychiatry

## 2012-08-20 DIAGNOSIS — F39 Unspecified mood [affective] disorder: Secondary | ICD-10-CM

## 2012-08-25 ENCOUNTER — Telehealth: Payer: Self-pay | Admitting: *Deleted

## 2012-08-25 ENCOUNTER — Encounter (HOSPITAL_COMMUNITY): Payer: Self-pay | Admitting: Psychiatry

## 2012-08-25 ENCOUNTER — Ambulatory Visit (INDEPENDENT_AMBULATORY_CARE_PROVIDER_SITE_OTHER): Payer: BC Managed Care – PPO | Admitting: Psychiatry

## 2012-08-25 VITALS — BP 126/85 | Wt 183.0 lb

## 2012-08-25 DIAGNOSIS — F319 Bipolar disorder, unspecified: Secondary | ICD-10-CM

## 2012-08-25 DIAGNOSIS — F39 Unspecified mood [affective] disorder: Secondary | ICD-10-CM

## 2012-08-25 MED ORDER — AMITRIPTYLINE HCL 10 MG PO TABS: 10.0000 mg | ORAL_TABLET | Freq: Every day | ORAL | Status: DC

## 2012-08-25 MED ORDER — ZOLPIDEM TARTRATE 5 MG PO TABS: 5.0000 mg | ORAL_TABLET | Freq: Every evening | ORAL | Status: DC | PRN

## 2012-08-25 MED ORDER — LORAZEPAM 0.5 MG PO TABS: 0.5000 mg | ORAL_TABLET | Freq: Two times a day (BID) | ORAL | Status: DC | PRN

## 2012-08-25 NOTE — Progress Notes (Signed)
[facility] [id number] Progress Note  Alex [name] [id number] 57 y.o.  08/25/2012 1:36 PM  Chief Complaint: I'm doing so-so.  History of Present Illness: Patient is 57 year old Caucasian married man who came for his followup appointment.  He was last seen in November.  In recent weeks he has seen cardiologist for having anxiety spells, shortness of breath and skipping his keeping his heart beat.  He had holter and today he was told that he has PVC but does not need any intervention.  His blood pressure medication is also changed.  He is feeling better now.  He continues to have a lot of family issues.  He recently find out that his 71 year old daughter is cutting herself.  His son also having issues and feel his medication needs to be changed by his psychiatrist.  His wife continued to have ups and down.  Despite all family issues he had a better Christmas and holidays.  He is even thinking to go for a cruise on his wedding anniversary.  He is taking amitriptyline most of the night which is helping his sleep. He still and nervousness but denies any recent panic attack.  He denies any agitation anger mood swing.  He seeing therapist regularly.  He is taking Lamictal XR 300 mg which she requested as it is less expensive.  He is taking Ativan 1-2 tablet as needed.  He's not drinking or using any illegal substance.  He denies any recent aggression or violence.  Suicidal Ideation: No Plan Formed: No Patient has means to carry out plan: No  Homicidal Ideation: No Plan Formed: No Patient has means to carry out plan: No  Review of Systems: Psychiatric: Agitation: No Hallucination: No Depressed Mood: Yes Insomnia: Yes Hypersomnia: No Altered Concentration: No Feels Worthless: No Grandiose Ideas: No Belief In Special Powers: No New/Increased Substance Abuse: No Compulsions: No  Neurologic: Headache: Yes Seizure: No Paresthesias: No  Past  psychiatric history Patient has been  seeing in this office since 2009.  He has history of depression mood swing and anger.  [facility] to suicidal thoughts but he denies any suicidal attempt.  He denies any psychosis but admitted poor impulse control.  In the past he has taken Seroquel and Cymbalta.    Medical  history Patient has history of hypertension , immune deficiency syndrome , GERD , Tick disorder, coronary artery disease, hypertension, COPD, esophagus stricture, lumbago, osteoarthritis, malignant neoplasm of orbit, vertigo, Effexor, osteoarthritis and chronic fatigue.  Family history Patient's sister has alcohol issues and depression.  One sister was admitted in patient in [place].    Social History:  lives with her daughter and wife.  His another daughter lives in [place] area.    Outpatient Encounter Prescriptions as of 08/25/2012  Medication Sig Dispense Refill  . amitriptyline (ELAVIL) 10 MG tablet Take 1 tablet (10 mg total) by mouth at bedtime.  30 tablet  1  . aspirin EC 81 MG tablet Take 1 tablet (81 mg total) by mouth daily.      . LamoTRIgine (LAMICTAL XR) 300 MG TB24 Take 1 tablet (300 mg total) by mouth daily.  90 tablet  0  . LORazepam (ATIVAN) 0.5 MG tablet Take 1 tablet (0.5 mg total) by mouth 2 (two) times daily as needed for anxiety.  60 tablet  0  . naproxen (NAPROSYN) 500 MG tablet Take 500 mg by mouth 2 (two) times daily as needed. For pain      .  RABEprazole (ACIPHEX) 20 MG tablet Take 20 mg by mouth at bedtime.      [name] SYNTHROID 150 MCG tablet TAKE 1 TABLET AT BEDTIME  90 tablet  0  . Testosterone (AXIRON) 30 MG/ACT SOLN Place 2 Act onto the skin every morning.      . zolpidem (AMBIEN) 5 MG tablet Take 1 tablet (5 mg total) by mouth at bedtime as needed.  15 tablet  0  . [DISCONTINUED] amitriptyline (ELAVIL) 10 MG tablet Take 1 tablet (10 mg total) by mouth at bedtime.  30 tablet  1  . [DISCONTINUED] LORazepam (ATIVAN) 0.5 MG tablet Take 1 tablet  (0.5 mg total) by mouth 2 (two) times daily as needed for anxiety.  60 tablet  0  . [DISCONTINUED] zolpidem (AMBIEN) 5 MG tablet Take 1 tablet (5 mg total) by mouth at bedtime as needed.  15 tablet  0  . atorvastatin (LIPITOR) 40 MG tablet Take 40 mg by mouth at bedtime.      . Coenzyme Q10 (CO Q 10 PO) Take by mouth daily.      [name] guaiFENesin (MUCINEX) 600 MG 12 hr tablet Take 600 mg by mouth 2 (two) times daily as needed. For congestion      . hydroxypropyl methylcellulose (ISOPTO TEARS) 2.5 % ophthalmic solution Place 1 drop into both eyes 4 (four) times daily as needed. For dry eyes      . Immune Globulin, Human, (HIZENTRA) 4 GM/20ML SOLN Inject into the skin once a week. wednesday      . losartan-hydrochlorothiazide (HYZAAR) 50-12.5 MG per tablet Take 1 tablet by mouth daily.  90 tablet  3  . OMEGA-3 KRILL OIL PO Take by mouth daily.      [name] sulfamethoxazole-trimethoprim (BACTRIM DS) 800-160 MG per tablet Take 1 tablet by mouth 2 (two) times daily.      . Tamsulosin HCl (FLOMAX) 0.4 MG CAPS Take 0.4 mg by mouth as needed.      [name] telmisartan-hydrochlorothiazide (MICARDIS HCT) 40-12.5 MG per tablet Take 1 tablet by mouth daily.  30 tablet  5  . telmisartan-hydrochlorothiazide (MICARDIS HCT) 40-12.5 MG per tablet Take 1 tablet by mouth daily.  30 tablet  3  . terbinafine (LAMISIL) 250 MG tablet Take 250 mg by mouth every morning.       . [DISCONTINUED] aspirin EC 81 MG tablet Take 1 tablet (81 mg total) by mouth daily.  30 tablet  11  . [DISCONTINUED] meperidine (DEMEROL) 50 MG tablet Take 50 mg by mouth every 4 (four) hours as needed.      . [DISCONTINUED] oxyCODONE-acetaminophen (PERCOCET) 5-325 MG per tablet Take 1 tablet by mouth every 4 (four) hours as needed for pain.  40 tablet  0    Past Psychiatric History/Hospitalization(s): Anxiety: Yes Bipolar Disorder: Yes Depression: Yes Mania: No Psychosis: No Schizophrenia: No Personality Disorder: No Hospitalization for psychiatric  illness: Yes History of Electroconvulsive Shock Therapy: No Prior Suicide Attempts: No  Physical Exam: Constitutional:  BP 126/85  Wt 183 lb (83.008 kg)  General Appearance: alert, oriented, no acute distress  Musculoskeletal: Strength & Muscle Tone: within normal limits Gait & Station: normal Patient leans: N/A  Review of Systems  Constitutional: Positive for malaise/fatigue.  Cardiovascular: Positive for palpitations.  Musculoskeletal: Positive for back pain.  Neurological:       Restless  Psychiatric/Behavioral: The patient is nervous/anxious and has insomnia.    Mental status examination Patient is casually dressed and fairly groomed.  He is anxious but maintained  a good eye contact.  He described his mood is anxious and sad.  His affect is mood appropriate.  He denies any active or passive suicidal thoughts or homicidal thoughts.  He denies any auditory or visual hallucination.  At times he is restless but there were no tremors or shakes present.  His psychomotor activity is slightly increased.  His thought process is logical linear and goal-directed.  There were no flight of ideas present at this time.  His fund of knowledge is adequate.  His attention and concentration is okay.  He's alert and oriented x3.  His insight judgment and impulse control is okay.   Medical Decision Making (Choose Three): Established Problem, Stable/Improving (1), Review of Psycho-Social Stressors (1), Review or order clinical lab tests (1), Review and summation of old records (2), Review of Last Therapy Session (1), Review or order medicine tests (1) and Review of Medication Regimen & Side Effects (2)  Assessment: Axis I:  bipolar disorder1  Axis II:  deferred  Axis III:  see medical history   Patient Active Problem List  Diagnosis  . MALIGNANT NEOPLASM OF ORBIT  . NEOPLASM, MALIGNANT, THYROID GLAND  . HYPOGONADISM, MALE  . HYPERLIPIDEMIA-MIXED  . Common variable immunodeficiency  .  DEPRESSION  . HYPERTENSION, BENIGN  . CORONARY ATHEROSCLEROSIS  . SINUSITIS- ACUTE-NOS  . SINUSITIS  . COPD  . ESOPHAGEAL STRICTURE  . EPIDYDIMITIS/ORCHITIS  . GENERALIZED OSTEOARTHROSIS UNSPECIFIED SITE  . OSTEOARTHRITIS  . Lumbago  . FOOT PAIN  . FATIGUE  . RASH-NONVESICULAR  . COUGH  . CHANGE IN BOWELS  . IMPAIRED GLUCOSE TOLERANCE  . THYROIDECTOMY, HX OF  . ANXIETY  . Bipolar 1 disorder, depressed, moderate  . Vertigo  . Ataxia  . Preop exam for internal medicine  . Ingrowing toenail with infection  . Localized osteoarthritis of left knee    Axis IV: Mild to moderate  Axis V: 55-65  Plan: I review his psychosocial stressors, recent labs, medication and response to medication. I recommend to continue elavil 10 mg every night for insomnia and anxiety along with Lamictal, Ambien and ativan. Risk and benefit explain in detail. Reassurance given. Time spend 25 min. Follow up in 8 weeks. Anil Havard T., MD 08/25/2012

## 2012-08-25 NOTE — Telephone Encounter (Signed)
Spoke with pt, aware monitor reviewed by dr Jens Som shows sinus with PAC's, PVC's brief PAT.

## 2012-08-26 ENCOUNTER — Ambulatory Visit (HOSPITAL_COMMUNITY): Payer: Self-pay | Admitting: Licensed Clinical Social Worker

## 2012-08-26 NOTE — Telephone Encounter (Signed)
F/u   patient calling have several  Questions.    1. Lab results 2. Discuss Medication   3. Discuss upcoming appt.

## 2012-08-26 NOTE — Telephone Encounter (Signed)
Spoke with pt, he canceled his GXT, he felt he did not need that testing at this time, he is not having any problems. He has about 3 weeks of micardis left and then he will start the losartan/HCT. He knows to have his blood work checked one week after changing the med. All questions answered.

## 2012-08-31 ENCOUNTER — Other Ambulatory Visit: Payer: Self-pay

## 2012-08-31 ENCOUNTER — Encounter: Payer: Self-pay | Admitting: Nurse Practitioner

## 2012-09-07 ENCOUNTER — Other Ambulatory Visit: Payer: Self-pay | Admitting: Cardiology

## 2012-09-08 ENCOUNTER — Telehealth: Payer: Self-pay | Admitting: Cardiology

## 2012-09-08 NOTE — Telephone Encounter (Signed)
Pt calling re changing losartin to higher dose without diuretic in it

## 2012-09-09 ENCOUNTER — Encounter (HOSPITAL_COMMUNITY): Payer: Self-pay | Admitting: Licensed Clinical Social Worker

## 2012-09-09 ENCOUNTER — Telehealth (HOSPITAL_COMMUNITY): Payer: Self-pay | Admitting: Psychiatry

## 2012-09-09 ENCOUNTER — Ambulatory Visit (INDEPENDENT_AMBULATORY_CARE_PROVIDER_SITE_OTHER): Payer: BC Managed Care – PPO | Admitting: Licensed Clinical Social Worker

## 2012-09-09 DIAGNOSIS — F3132 Bipolar disorder, current episode depressed, moderate: Secondary | ICD-10-CM

## 2012-09-09 NOTE — Progress Notes (Signed)
   THERAPIST PROGRESS NOTE  Session Time: 1:00pm-1:50pm  Participation Level: Active  Behavioral Response: Well GroomedAlertAnxious, Depressed and Irritable  Type of Therapy: Individual Therapy  Treatment Goals addressed: Anxiety and Coping  Interventions: CBT, DBT, Motivational Interviewing, Strength-based, Supportive and Reframing  Summary: Alex [name] is a 57 y.o. male who presents with depressed mood and anxious/agitated affect. He reports ongoing frustration in his marriage and continues to try to elliciet positive responses from his wife. He is upset with her for accusing him of lying and opening a senstive piece of mail. He endorse feelings of jealousy that she has social outlets, friends at work and opportunities for validation. He knows he needs to explore opportunities like this outside his marriage, but he does not. He is challenged by feelings of agitation and he tries to keep his frustration under control. He does not believe that he and his wife argue as much as his children tell him. He denies any suicidal ideation, intent or plan. His sleep and appetite are wnl.    Suicidal/Homicidal: Nowithout intent/plan  Therapist Response: Assessed patients current functioning and reviewed progress. Reviewed coping strategies. Assessed patients safety and assisted in identifying protective factors.  Reviewed crisis plan with patient. Assisted patient with the expression of his feelings of frustration and anxiety. Used CBT to assist patient with the identification of negative distortions and irrational thoughts. Encouraged patient to verbalize alternative and factual responses which challenge thought distortions. Used DBT to practice mindfulness, review distraction list and improve distress tolerance skills. Used motivational interviewing to assist and encourage patient through the change process. Explored patients barriers to change. Reviewed patients self care plan. Assessed  progress  related to self care. Patient's self care is good. Recommend proper diet, regular exercise, socialization and recreation.   Plan: Return again in two weeks.  Diagnosis: Axis I: Bipolar, Depressed    Axis II: No diagnosis    Alex Domino, LCSW 09/09/2012

## 2012-09-09 NOTE — Telephone Encounter (Signed)
Message copied by Cleotis Nipper on Wed Sep 09, 2012  3:22 PM ------      Message from: Remus Loffler      Created: Wed Sep 09, 2012  1:47 PM      Regarding: Zetta Bills      Contact: [phone]       Saw Alex [name] today. He reports erectile dysfunction and believes it is related to Lamicital XR. The symptoms started when he changed from regular Lamicital to XR. Please call him with recommendations.

## 2012-09-09 NOTE — Telephone Encounter (Signed)
Call returned.  Patient complained of sexual side effects with Lamictal extended-release.  Recommend to discuss with his primary care physician since it is unlikely that Lamictal is causing sexual side effects.  He's been taking Lamictal for a long time.  However if primary care physician do not find anything we will switch him back to regular Lamictal.

## 2012-09-23 ENCOUNTER — Ambulatory Visit (INDEPENDENT_AMBULATORY_CARE_PROVIDER_SITE_OTHER): Payer: Commercial Indemnity | Admitting: Licensed Clinical Social Worker

## 2012-09-23 DIAGNOSIS — F3132 Bipolar disorder, current episode depressed, moderate: Secondary | ICD-10-CM

## 2012-09-23 NOTE — Progress Notes (Signed)
   THERAPIST PROGRESS NOTE  Session Time: 1:00pm-1:50pm  Participation Level: Active  Behavioral Response: Well GroomedAlertAnxious and Depressed  Type of Therapy: Individual Therapy  Treatment Goals addressed: Coping  Interventions: CBT, DBT, Solution Focused, Strength-based, Supportive and Reframing  Summary: Alex [name] is a 58 y.o. male who presents with depressed mood and anxious affect. He just found out that his daughter is pregnant and is very upset over this. He processes his anger for how she lives her life and that he and his wife continue to bail her out. He is angry with his wife for her lack of emotional support, lack of affection and unwillingness to address financial issues. He continues to manipulate situations in hopes of getting a positive response from his wife. His sleep is disrupted and his appetite is wnl. He reports passive, fleeting suicidal thoughts, but is able to quickly redirect his thoughts and discusses his family as his protective factor. He is able to committ to a safety plan.    Suicidal/Homicidal: Yeswithout intent/plan  Therapist Response: Assessed patients current functioning and reviewed progress. Reviewed coping strategies. Assessed patients safety and assisted in identifying protective factors.  Reviewed crisis plan with patient. Assisted patient with the expression of his feelings of frustration and anger. Used CBT to assist patient with the identification of negative distortions and irrational thoughts. Encouraged patient to verbalize alternative and factual responses which challenge thought distortions. Patient demonstrate very little capacity to tolerate change. He is anxious when confronted about his manipulative behviors and attempts to steer the conversation elsewhere. Used DBT to practice mindfulness, review distraction list and improve distress tolerance skills. Used motivational interviewing to assist and encourage patient through the change  process. Explored patients barriers to change. Reviewed patients self care plan. Assessed  progress related to self care. Patient's self care is fair. Recommend proper diet, regular exercise, socialization and recreation.   Plan: Return again in two weeks.  Diagnosis: Axis I: Bipolar, Depressed    Axis II: No diagnosis    Michaelene Dutan, LCSW 09/23/2012

## 2012-09-29 ENCOUNTER — Other Ambulatory Visit (HOSPITAL_COMMUNITY): Payer: Self-pay | Admitting: Psychiatry

## 2012-09-29 DIAGNOSIS — F39 Unspecified mood [affective] disorder: Secondary | ICD-10-CM

## 2012-10-07 ENCOUNTER — Ambulatory Visit (INDEPENDENT_AMBULATORY_CARE_PROVIDER_SITE_OTHER): Payer: Commercial Indemnity | Admitting: Licensed Clinical Social Worker

## 2012-10-07 DIAGNOSIS — F3132 Bipolar disorder, current episode depressed, moderate: Secondary | ICD-10-CM

## 2012-10-07 NOTE — Progress Notes (Signed)
   THERAPIST PROGRESS NOTE  Session Time: 1:00pm-1:50pm  Participation Level: Active  Behavioral Response: Well GroomedAlertAnxious, Depressed and Irritable  Type of Therapy: Individual Therapy  Treatment Goals addressed: Coping  Interventions: CBT, DBT, Motivational Interviewing, Strength-based, Supportive and Reframing  Summary: Alex [name] is a 57 y.o. male who presents with depressed mood and anxious affect. He is upset after learning that his daughter had an abortion. He processes his grief and disappointment in her. He reports some improvement in his interactions with his wife, but realizes that this is temporary. He is trying to "be on my best behavior" because he and his wife are leaving on a cruise. He remains frustrated with his daughter [name] and is concerned how she is going to pay for court costs and rent. He does not want to help her, but feels there is no other option. His sleep and appetite are wnl.    Suicidal/Homicidal: Nowithout intent/plan  Therapist Response: Assessed patients current functioning and reviewed progress. Reviewed coping strategies. Assessed patients safety and assisted in identifying protective factors.  Reviewed crisis plan with patient. Assisted patient with the expression of frustration and depresion. Reviewed patients self care plan. Assessed progress related to self care. Patients self care is fair. Recommend daily exercise, increased socialization and recreation. Used CBT to assist patient with the identification of negative distortions and irrational thoughts. Encouraged patient to verbalize alternative and factual responses which challenge thought distortions. Challenged patients history of enabling his daughter and discussed the consequences of this. Used DBT to practice mindfulness, review distraction list and improve distress tolerance skills. Used motivational interviewing to assist and encourage patient through the change process. Explored  patients barriers to change. Marland Kitchenkjn   Plan: Return again in two weeks.  Diagnosis: Axis I: Bipolar, Depressed    Axis II: No diagnosis    Berklie Dethlefs, LCSW 10/07/2012

## 2012-10-20 ENCOUNTER — Ambulatory Visit (INDEPENDENT_AMBULATORY_CARE_PROVIDER_SITE_OTHER): Payer: Commercial Indemnity | Admitting: Licensed Clinical Social Worker

## 2012-10-20 DIAGNOSIS — F3132 Bipolar disorder, current episode depressed, moderate: Secondary | ICD-10-CM

## 2012-10-20 NOTE — Progress Notes (Signed)
   THERAPIST PROGRESS NOTE  Session Time: 1:00pm-1:50pm  Participation Level: Active  Behavioral Response: Well GroomedAlertAnxious, Depressed and Irritable  Type of Therapy: Individual Therapy  Treatment Goals addressed: Coping  Interventions: CBT, Motivational Interviewing, Strength-based, Assertiveness Training, Supportive and Family Systems  Summary: STANLEY LYNESS is a 58 y.o. male who presents with anxious mood and affect. He recently returned from a cruise with his wife and had a good time. He is pleased that they did not argue at all on the trip, but now endorses the return of stress associated with his daughter and finances. He is upset with his daughter for having an abortion and he processes his grief. He feels bad that he was unable to see her to try to talk her out of this decision. He remains stressed over money and blames his wife for not setting limits and for avoiding conflict with their daughter. He wants to say no to his daughter but is fearful of conflict that will ensue with his wife. He feels trapped in his marriage and does not feel that he has much influence with his children. His sleep and appetite are wnl.    Suicidal/Homicidal: Nowithout intent/plan  Therapist Response: Assessed patients current functioning and reviewed progress. Reviewed coping strategies. Assessed patients safety and assisted in identifying protective factors.  Reviewed crisis plan with patient. Assisted patient with the expression of anxiety and frustration. Reviewed patients self care plan. Assessed progress related to self care. Patients self care is good. Recommend daily exercise, increased socialization and recreation. Reviewed healthy boundaries and assertive communication. Used CBT to assist patient with the identification of negative distortions and irrational thoughts. Encouraged patient to verbalize alternative and factual responses which challenge thought distortions. Used motivational  interviewing to assist and encourage patient through the change process. Explored patients barriers to change. Patient remains stuck in avoidant, codependent behavioral patterns within his family. Recommend a session with his wife to address parenting issues.   Plan: Return again in two weeks.  Diagnosis: Axis I: bi polar disorder    Axis II: No diagnosis    NORDEN,KRISTIN, LCSW 10/20/2012

## 2012-10-21 ENCOUNTER — Ambulatory Visit (HOSPITAL_COMMUNITY): Payer: Self-pay | Admitting: Licensed Clinical Social Worker

## 2012-10-28 ENCOUNTER — Encounter (HOSPITAL_COMMUNITY): Payer: Self-pay | Admitting: Psychiatry

## 2012-10-28 ENCOUNTER — Ambulatory Visit (INDEPENDENT_AMBULATORY_CARE_PROVIDER_SITE_OTHER): Payer: Commercial Indemnity | Admitting: Psychiatry

## 2012-10-28 VITALS — BP 138/98 | HR 85 | Wt 186.9 lb

## 2012-10-28 DIAGNOSIS — F39 Unspecified mood [affective] disorder: Secondary | ICD-10-CM

## 2012-10-28 DIAGNOSIS — F319 Bipolar disorder, unspecified: Secondary | ICD-10-CM

## 2012-10-28 MED ORDER — LAMOTRIGINE ER 300 MG PO TB24: 300.0000 mg | ORAL_TABLET | Freq: Every day | ORAL | Status: DC

## 2012-10-28 MED ORDER — ZOLPIDEM TARTRATE 5 MG PO TABS: ORAL_TABLET | ORAL | Status: DC

## 2012-10-28 MED ORDER — LORAZEPAM 0.5 MG PO TABS: ORAL_TABLET | ORAL | Status: DC

## 2012-10-28 NOTE — Progress Notes (Signed)
[facility] [id number] Progress Note  Alex [name] [id number] 57 y.o.  10/28/2012 2:33 PM  Chief Complaint: I stop taking my blood pressure medication because it was giving the sexual side effects.    History of Present Illness: Patient is 57 year old Caucasian married man who came for his followup appointment.  He was last seen on January 14.  Few weeks ago he called and concern about sexual side effects with Lamictal.  I explained that it is unlikely that Lamictal is causing sexual side effects.  He had consulted his primary care physician and cardiologist and now his blood pressure medicine has been changed.  He is taking losartan .  Overall his mood has been better.  He does not require amitriptyline.  He sleeping better with Ambien and Ativan.  There are days when he required second dose of Ativan.  He continues to have psychosocial stress from the family that he's been having better.  He seeing therapist regularly.  He has gained some weight which he believed do to recent change in his pressure medication.  He denies any recent agitation anger mood swing.  He denies any recent panic attack.  He's not drinking or using any illegal substance.  His sleep is improved from the past.  Suicidal Ideation: No Plan Formed: No Patient has means to carry out plan: No  Homicidal Ideation: No Plan Formed: No Patient has means to carry out plan: No  Review of Systems: Psychiatric: Agitation: No Hallucination: No Depressed Mood: No Insomnia: No Hypersomnia: No Altered Concentration: No Feels Worthless: No Grandiose Ideas: No Belief In Special Powers: No New/Increased Substance Abuse: No Compulsions: No  Neurologic: Headache: Yes Seizure: No Paresthesias: No  Past  psychiatric history Patient has been seeing in this office since 2009.  He has history of depression mood swing and anger.  [facility] to suicidal thoughts but he denies any  suicidal attempt.  He denies any psychosis but admitted poor impulse control.  In the past he has taken Seroquel and Cymbalta.    Medical  history Patient has history of hypertension , immune deficiency syndrome , GERD , Tick disorder, coronary artery disease, hypertension, COPD, esophagus stricture, lumbago, osteoarthritis, malignant neoplasm of orbit, vertigo, Effexor, osteoarthritis and chronic fatigue.  Family history Patient's sister has alcohol issues and depression.  One sister was admitted in patient in [place].    Social History:  lives with her daughter and wife.  His another daughter lives in [place] area.    Outpatient Encounter Prescriptions as of 10/28/2012  Medication Sig Dispense Refill  . LamoTRIgine (LAMICTAL XR) 300 MG TB24 Take 1 tablet (300 mg total) by mouth daily.  90 tablet  0  . LORazepam (ATIVAN) 0.5 MG tablet TAKE 1 TABLET BY MOUTH TWICE A DAY AS NEEDED FOR ANXIETY  60 tablet  1  . zolpidem (AMBIEN) 5 MG tablet TAKE 1 TABLET BY MOUTH AT BEDTIME AS NEEDED  15 tablet  1  . [DISCONTINUED] LamoTRIgine (LAMICTAL XR) 300 MG TB24 Take 1 tablet (300 mg total) by mouth daily.  90 tablet  0  . [DISCONTINUED] LORazepam (ATIVAN) 0.5 MG tablet TAKE 1 TABLET BY MOUTH TWICE A DAY AS NEEDED FOR ANXIETY  60 tablet  0  . [DISCONTINUED] zolpidem (AMBIEN) 5 MG tablet TAKE 1 TABLET BY MOUTH AT BEDTIME AS NEEDED  15 tablet  0  . aspirin EC 81 MG tablet Take 1 tablet (81 mg total) by mouth  daily.      . atorvastatin (LIPITOR) 40 MG tablet Take 40 mg by mouth at bedtime.      [name] atorvastatin (LIPITOR) 40 MG tablet TAKE 1 TABLET BY MOUTH DAILY  90 tablet  3  . Coenzyme Q10 (CO Q 10 PO) Take by mouth daily.      [name] guaiFENesin (MUCINEX) 600 MG 12 hr tablet Take 600 mg by mouth 2 (two) times daily as needed. For congestion      . hydroxypropyl methylcellulose (ISOPTO TEARS) 2.5 % ophthalmic solution Place 1 drop into both eyes 4 (four) times daily as needed. For dry eyes      .  Immune Globulin, Human, (HIZENTRA) 4 GM/20ML SOLN Inject into the skin once a week. wednesday      . losartan-hydrochlorothiazide (HYZAAR) 50-12.5 MG per tablet Take 1 tablet by mouth daily.  90 tablet  3  . naproxen (NAPROSYN) 500 MG tablet Take 500 mg by mouth 2 (two) times daily as needed. For pain      . OMEGA-3 KRILL OIL PO Take by mouth daily.      . RABEprazole (ACIPHEX) 20 MG tablet Take 20 mg by mouth at bedtime.      . sulfamethoxazole-trimethoprim (BACTRIM DS) 800-160 MG per tablet Take 1 tablet by mouth 2 (two) times daily.      [name] SYNTHROID 150 MCG tablet TAKE 1 TABLET AT BEDTIME  90 tablet  0  . Tamsulosin HCl (FLOMAX) 0.4 MG CAPS Take 0.4 mg by mouth as needed.      . terbinafine (LAMISIL) 250 MG tablet Take 250 mg by mouth every morning.       . Testosterone (AXIRON) 30 MG/ACT SOLN Place 2 Act onto the skin every morning.      . [DISCONTINUED] amitriptyline (ELAVIL) 10 MG tablet Take 1 tablet (10 mg total) by mouth at bedtime.  30 tablet  1  . [DISCONTINUED] telmisartan-hydrochlorothiazide (MICARDIS HCT) 40-12.5 MG per tablet Take 1 tablet by mouth daily.  30 tablet  5  . [DISCONTINUED] telmisartan-hydrochlorothiazide (MICARDIS HCT) 40-12.5 MG per tablet Take 1 tablet by mouth daily.  30 tablet  3   No facility-administered encounter medications on file as of 10/28/2012.    Past Psychiatric History/Hospitalization(s): Anxiety: Yes Bipolar Disorder: Yes Depression: Yes Mania: No Psychosis: No Schizophrenia: No Personality Disorder: No Hospitalization for psychiatric illness: Yes History of Electroconvulsive Shock Therapy: No Prior Suicide Attempts: No  Physical Exam: Constitutional:  BP 138/98  Pulse 85  Wt 186 lb 14.4 oz (84.777 kg)  BMI 25.34 kg/m2  General Appearance: alert, oriented, no acute distress  Musculoskeletal: Strength & Muscle Tone: within normal limits Gait & Station: normal Patient leans: N/A  Review of Systems  Cardiovascular: Positive for  palpitations.  Musculoskeletal: Positive for back pain.  Neurological:       Restless  Psychiatric/Behavioral: The patient is nervous/anxious.    Mental status examination Patient is casually dressed and fairly groomed.  He is anxious but maintained a good eye contact.  He described his mood is anxious and sad.  His affect is mood appropriate.  He denies any active or passive suicidal thoughts or homicidal thoughts.  He denies any auditory or visual hallucination.  At times he is restless but there were no tremors or shakes present.  His psychomotor activity is slightly increased.  His thought process is logical linear and goal-directed.  There were no flight of ideas present at this time.  His fund of knowledge is adequate.  His attention and concentration is okay.  He's alert and oriented x3.  His insight judgment and impulse control is okay.   Medical Decision Making (Choose Three): Established Problem, Stable/Improving (1), Review of Psycho-Social Stressors (1), Review and summation of old records (2), Review of Last Therapy Session (1), Review of Medication Regimen & Side Effects (2) and Review of New Medication or Change in Dosage (2)  Assessment: Axis I:  bipolar disorder1  Axis II:  deferred  Axis III:  see medical history   Patient Active Problem List  Diagnosis  . MALIGNANT NEOPLASM OF ORBIT  . NEOPLASM, MALIGNANT, THYROID GLAND  . HYPOGONADISM, MALE  . HYPERLIPIDEMIA-MIXED  . Common variable immunodeficiency  . DEPRESSION  . HYPERTENSION, BENIGN  . CORONARY ATHEROSCLEROSIS  . SINUSITIS- ACUTE-NOS  . SINUSITIS  . COPD  . ESOPHAGEAL STRICTURE  . EPIDYDIMITIS/ORCHITIS  . GENERALIZED OSTEOARTHROSIS UNSPECIFIED SITE  . OSTEOARTHRITIS  . Lumbago  . FOOT PAIN  . FATIGUE  . RASH-NONVESICULAR  . COUGH  . CHANGE IN BOWELS  . IMPAIRED GLUCOSE TOLERANCE  . THYROIDECTOMY, HX OF  . ANXIETY  . Bipolar 1 disorder, depressed, moderate  . Vertigo  . Ataxia  . Preop exam for  internal medicine  . Ingrowing toenail with infection  . Localized osteoarthritis of left knee    Axis IV: Mild to moderate  Axis V: 55-65  Plan: I will discontinue his amitriptyline.  We will continue his Ativan, Ambien and Lamictal extended-release at present does.  He is taking low-dose Ambien .  He takes half Ambien every night.  I explained risks and benefits of medication and recommend to call us back if he is any question or concern and worsening of the symptom.  Time spent 25 minutes.  More than 50% of the time spent and psychoeducation counseling and portion of care.  I will see him again in 2 months. Xzander Gilham T., MD 10/28/2012

## 2012-11-04 ENCOUNTER — Telehealth: Payer: Self-pay | Admitting: *Deleted

## 2012-11-04 NOTE — Telephone Encounter (Signed)
Does not like losartan because of the side effects and would like to switch back Micardis 80 mg. This is now available in generic so is more affordable on his insurance.  [facility] [address]

## 2012-11-04 NOTE — Telephone Encounter (Signed)
Left message for pt to call.

## 2012-11-06 ENCOUNTER — Telehealth: Payer: Self-pay | Admitting: Cardiology

## 2012-11-06 ENCOUNTER — Ambulatory Visit (INDEPENDENT_AMBULATORY_CARE_PROVIDER_SITE_OTHER): Payer: Commercial Indemnity | Admitting: Licensed Clinical Social Worker

## 2012-11-06 DIAGNOSIS — F3132 Bipolar disorder, current episode depressed, moderate: Secondary | ICD-10-CM

## 2012-11-06 NOTE — Telephone Encounter (Signed)
New problem   Pt is returning a phone call from [facility] yesterday.

## 2012-11-06 NOTE — Progress Notes (Signed)
   THERAPIST PROGRESS NOTE  Session Time: 11:30am-12:20pm  Participation Level: Active  Behavioral Response: Well GroomedAlertAnxious and Depressed  Type of Therapy: Individual Therapy  Treatment Goals addressed: Anxiety and Coping  Interventions: CBT, Motivational Interviewing, Strength-based, Assertiveness Training, Supportive and Reframing  Summary: Alex [name] is a 57 y.o. male who presents with depressed mood and anxious affect. He is upset that even thought his wife has the day off, she was not willing to attend a session today. He reports ongoing frustration in his marriage, continued financial stress and conflict between he and his wife over how to parent. He continues to express frustration with his daughter who lives in [place]. He feels helpless and is fearful about what is going to happen next in her life. While he tries to confront his wife, he often avoids this out of fear of being further rejected from her. He denies any angry outbursts and feels that inspite of this conflict, he is managing his stress level well. His sleep is disrupted by physical joint pain and his appetite is wnl.    Suicidal/Homicidal: Nowithout intent/plan  Therapist Response: Assessed patients current functioning and reviewed progress. Reviewed coping strategies. Assessed patients safety and assisted in identifying protective factors.  Reviewed crisis plan with patient. Assisted patient with the expression of anxiety and frustration. Reviewed patients self care plan. Assessed progress related to self care. Patients self care is fair. Recommend daily exercise, increased socialization and recreation. Used CBT to assist patient with the identification of negative distortions and irrational thoughts. Encouraged patient to verbalize alternative and factual responses which challenge thought distortions. Used DBT to practice mindfulness, review distraction list and improve distress tolerance skills. Used  motivational interviewing to assist and encourage patient through the change process. Explored patients barriers to change.Reviewed healthy boundaries and assertive communication.   Plan: Return again in two weeks.  Diagnosis: Axis I: bi polar    Axis II: No diagnosis    Enaya Howze, LCSW 11/06/2012

## 2012-11-09 NOTE — Telephone Encounter (Signed)
Ok to change to generic micardis 80 mg daily and DC cozaar [facility]

## 2012-11-09 NOTE — Telephone Encounter (Signed)
Was returning call to Alex [name] about previous phone call

## 2012-11-09 NOTE — Telephone Encounter (Signed)
Pt states was taken Micardis 80 mg for about ten years, and  did not have any side effects with it; But this medication was too expensive for him, so he was changed to Losartan. Pt is having many side effects with  losartan medication, so pt is not taken it anymore. Pt  found out that the Micardis  is now available in genetic form. Pt would like to change to the genetic form of Micardis 80 mg once a day like he was taken before.

## 2012-11-09 NOTE — Telephone Encounter (Signed)
Follow up call   Attaching message from 3/28.   Pt calling in returning phone call from last week

## 2012-11-09 NOTE — Telephone Encounter (Signed)
Follow Up   Pt calling in returning phone call from last week.

## 2012-11-09 NOTE — Telephone Encounter (Signed)
This is a duplicate message.

## 2012-11-09 NOTE — Telephone Encounter (Deleted)
Addendum  Closing out previous message from today.    Attaching previous mess. To previous note.

## 2012-11-10 MED ORDER — TELMISARTAN 80 MG PO TABS: 80.0000 mg | ORAL_TABLET | Freq: Every day | ORAL | Status: DC

## 2012-11-10 NOTE — Telephone Encounter (Signed)
Follow-up:    Patient would like to have [facility] [address] [facility] on [facility].  Please call back if you have any questions.

## 2012-11-10 NOTE — Telephone Encounter (Signed)
Spoke with pt, aware micardis called to pharm. He will track his bp and let us know if it is trending high.

## 2012-11-18 ENCOUNTER — Ambulatory Visit (INDEPENDENT_AMBULATORY_CARE_PROVIDER_SITE_OTHER): Payer: Commercial Indemnity | Admitting: Licensed Clinical Social Worker

## 2012-11-18 DIAGNOSIS — F3132 Bipolar disorder, current episode depressed, moderate: Secondary | ICD-10-CM

## 2012-11-18 NOTE — Progress Notes (Signed)
   THERAPIST PROGRESS NOTE  Session Time: 1:00pm-1:50pm  Participation Level: Active  Behavioral Response: Well GroomedAlertAnxious and Depressed  Type of Therapy: Individual Therapy  Treatment Goals addressed: Coping  Interventions: CBT, DBT, Motivational Interviewing, Strength-based, Supportive and Reframing  Summary: Alex [name] is a 57 y.o. male who presents with depressed mood and anxious affect. He reports having problems with his prostrate and possibly having a pelvic imflamatory problem which he is being treated for. He reports ongoing stress and anxiety related to Alex [name] daughter Alex [name]. He is focused on how her behavior has negatively impacted he and his wife. He is upset that his wife allowed their daughter to go to school in [place]. He feels that if he had been listened to, that things would be different. He remains concerned that his daughter is selling drugs and abusing them as well. He feels helpless and unsupported by his wife to take a stand. He is fearful of conflict with his wife and his children. He feels that he has been made out to be the "bad guy" in the family. He has been trying to keep busy for distraction. His sleep is disrupted and his appetite has increased and he is fearful he will gain weight.    Suicidal/Homicidal: Nowithout intent/plan  Therapist Response: Assessed patients current functioning and reviewed progress. Reviewed coping strategies. Assessed patients safety and assisted in identifying protective factors.  Reviewed crisis plan with patient. Assisted patient with the expression of anxiety and anger regarding his daughter and wife. Reviewed patients self care plan. Assessed progress related to self care. Patients self care is improving. Recommend daily exercise, increased socialization and recreation. Used CBT to assist patient with the identification of negative distortions and irrational thoughts. Encouraged patient to verbalize alternative and  factual responses which challenge thought distortions. Used DBT to practice mindfulness, review distraction list and improve distress tolerance skills. Reviewed healthy boundaries and assertive communication. Used motivational interviewing to assist and encourage patient through the change process. Explored patients barriers to change.   Plan: Return again in two weeks.  Diagnosis: Axis I: bi polar    Axis II: Deferred    Mckinzee Spirito, LCSW 11/18/2012

## 2012-11-23 ENCOUNTER — Encounter (HOSPITAL_COMMUNITY): Payer: Self-pay | Admitting: *Deleted

## 2012-11-23 NOTE — Progress Notes (Signed)
Zolpidem Tartrate 5 mg 15 tablets/30 days authorized by [facility] for 36 months, until 11/24/15 Patient and pharmacy notified.

## 2012-12-02 ENCOUNTER — Ambulatory Visit (HOSPITAL_COMMUNITY): Payer: Self-pay | Admitting: Licensed Clinical Social Worker

## 2012-12-16 ENCOUNTER — Ambulatory Visit (HOSPITAL_COMMUNITY): Payer: Self-pay | Admitting: Licensed Clinical Social Worker

## 2012-12-17 ENCOUNTER — Ambulatory Visit (HOSPITAL_COMMUNITY): Payer: Self-pay | Admitting: Licensed Clinical Social Worker

## 2012-12-27 ENCOUNTER — Other Ambulatory Visit (HOSPITAL_COMMUNITY): Payer: Self-pay | Admitting: Psychiatry

## 2012-12-28 ENCOUNTER — Other Ambulatory Visit (HOSPITAL_COMMUNITY): Payer: Self-pay | Admitting: Psychiatry

## 2012-12-28 ENCOUNTER — Telehealth: Payer: Self-pay | Admitting: Cardiology

## 2012-12-28 NOTE — Telephone Encounter (Signed)
New Prob      Pt is having some issues regarding his BP and has been experiencing palpitations. Would like to speak to nurse.

## 2012-12-28 NOTE — Telephone Encounter (Signed)
Spoke with patient who c/o felt heart "fluttering" during the night and elevated BP.  Patient states BP readings today are 153/108 L arm; 155/110 R arm @ 0645; 153/112 and 156/109 @ 0755; and 115/110 and 156/112 @ 1130.  Patient states he has not had any problems with his heart feeling like this since 1/14 when he wore a monitor which showed PACs, PVCs and brief PAT.  Patient states his heart rate feels normal now. Patient denies chest pain but states he may be a little more SOB.  Patient states he recently began taking OTC supplements and wonders if these are having an effect on his BP.   I advised patient that Dr. Jens Som is not in the office today but that Dr. Shirlee Latch, DOD does not recommend making any medication changes today.  I advised patient that he could come in for an EKG today and to continue to monitor BP and record.  Patient states he will continue to monitor BP today and in the morning and could Deliah Goody, RN call him tomorrow. I advised patient that I will route message to her but to call back to triage tomorrow if he does not receive a call as they would be in clinic with patients.  Patient verbalized understanding to call back if he has further "fluttering" as an EKG would need to be done and to stop supplements until he talks with Dr. Jens Som.

## 2012-12-28 NOTE — Telephone Encounter (Signed)
Stay off supplements and monitor BP Alex [name]

## 2012-12-29 ENCOUNTER — Encounter (HOSPITAL_COMMUNITY): Payer: Self-pay | Admitting: Psychiatry

## 2012-12-29 ENCOUNTER — Ambulatory Visit (INDEPENDENT_AMBULATORY_CARE_PROVIDER_SITE_OTHER): Payer: Commercial Indemnity | Admitting: Psychiatry

## 2012-12-29 VITALS — BP 111/97 | HR 86 | Ht 72.0 in | Wt 180.0 lb

## 2012-12-29 DIAGNOSIS — F319 Bipolar disorder, unspecified: Secondary | ICD-10-CM

## 2012-12-29 DIAGNOSIS — F39 Unspecified mood [affective] disorder: Secondary | ICD-10-CM

## 2012-12-29 MED ORDER — LORAZEPAM 0.5 MG PO TABS: ORAL_TABLET | ORAL | Status: DC

## 2012-12-29 MED ORDER — ZOLPIDEM TARTRATE 5 MG PO TABS: ORAL_TABLET | ORAL | Status: DC

## 2012-12-29 MED ORDER — LAMOTRIGINE ER 300 MG PO TB24: 300.0000 mg | ORAL_TABLET | Freq: Every day | ORAL | Status: DC

## 2012-12-29 NOTE — Progress Notes (Addendum)
[facility] [id number] Progress Note  Alex [name] [id number] 57 y.o.  12/29/2012 1:49 PM  Chief Complaint:  Medication management and followup.      History of Present Illness:  Patient is 57 year old Caucasian married man who came for his followup appointment.  He is complying with his Lamictal, Ativan and Ambien.  He is is stressed about his daughter who is not doing academically very well.  He is concerned that she may not get financial assistance if she does not improve her GPA.  Patient told that he and his wife cannot afford their daughter expense if she is not making good performance at school.  Patient has not seen therapist in past 4 weeks.  He had missed appointment and the therapist also called in sick.  Patient is scheduled to see her tomorrow.  He is not taking losartan and there has been times when his blood pressure was high.  He is monitoring his blood pressure at home.  He is taking Micardis .  Patient has multiple physical illness.  He likes his Lamictal.  He denies any rash or itching.  His appetite invaders unchanged from the past.  He's not drinking or using any illegal substance.  He denies any recent panic attack however admitted under a lot of stress due to her daughter.    Suicidal Ideation: No Plan Formed: No Patient has means to carry out plan: No  Homicidal Ideation: No Plan Formed: No Patient has means to carry out plan: No  Review of Systems: Psychiatric: Agitation: No Hallucination: No Depressed Mood: No Insomnia: No Hypersomnia: No Altered Concentration: No Feels Worthless: No Grandiose Ideas: No Belief In Special Powers: No New/Increased Substance Abuse: No Compulsions: No  Neurologic: Headache: Yes Seizure: No Paresthesias: No  Past  psychiatric history Patient has been seeing in this office since 2009.  He has history of depression mood swing and anger.  [facility] due to suicidal thoughts but he  denies any suicidal attempt.  He denies any psychosis but admitted poor impulse control.  In the past he has taken Seroquel and Cymbalta.    Medical  history Patient has history of hypertension , immune deficiency syndrome , GERD , Tick disorder, coronary artery disease, hypertension, COPD, esophagus stricture, lumbago, osteoarthritis, malignant neoplasm of orbit, vertigo, Effexor, osteoarthritis and chronic fatigue.  Family history Patient's sister has alcohol issues and depression.  One sister was admitted in patient in [place].    Social History:  lives with her daughter and wife.  His another daughter lives in [place] area.    Outpatient Encounter Prescriptions as of 12/29/2012  Medication Sig Dispense Refill  . aspirin EC 81 MG tablet Take 1 tablet (81 mg total) by mouth daily.      [name] atorvastatin (LIPITOR) 40 MG tablet TAKE 1 TABLET BY MOUTH DAILY  90 tablet  3  . LamoTRIgine (LAMICTAL XR) 300 MG TB24 Take 1 tablet (300 mg total) by mouth daily.  90 tablet  0  . LORazepam (ATIVAN) 0.5 MG tablet TAKE 1 TABLET BY MOUTH TWICE A DAY AS NEEDED FOR ANXIETY  60 tablet  2  . SYNTHROID 150 MCG tablet TAKE 1 TABLET AT BEDTIME  90 tablet  0  . Tamsulosin HCl (FLOMAX) 0.4 MG CAPS Take 0.4 mg by mouth as needed.      [name] telmisartan (MICARDIS) 80 MG tablet Take 1 tablet (80 mg total) by mouth daily.  90 tablet  3  .  terbinafine (LAMISIL) 250 MG tablet Take 250 mg by mouth every morning.       . Testosterone (AXIRON) 30 MG/ACT SOLN Place 2 Act onto the skin every morning.      . zolpidem (AMBIEN) 5 MG tablet TAKE 1 TABLET BY MOUTH AT BEDTIME AS NEEDED  15 tablet  2  . [DISCONTINUED] LamoTRIgine (LAMICTAL XR) 300 MG TB24 Take 1 tablet (300 mg total) by mouth daily.  90 tablet  0  . [DISCONTINUED] LORazepam (ATIVAN) 0.5 MG tablet TAKE 1 TABLET BY MOUTH TWICE A DAY AS NEEDED FOR ANXIETY  60 tablet  1  . [DISCONTINUED] zolpidem (AMBIEN) 5 MG tablet TAKE 1 TABLET BY MOUTH AT BEDTIME AS  NEEDED  15 tablet  1  . atorvastatin (LIPITOR) 40 MG tablet Take 40 mg by mouth at bedtime.      . Coenzyme Q10 (CO Q 10 PO) Take by mouth daily.      [name] guaiFENesin (MUCINEX) 600 MG 12 hr tablet Take 600 mg by mouth 2 (two) times daily as needed. For congestion      . hydroxypropyl methylcellulose (ISOPTO TEARS) 2.5 % ophthalmic solution Place 1 drop into both eyes 4 (four) times daily as needed. For dry eyes      . Immune Globulin, Human, (HIZENTRA) 4 GM/20ML SOLN Inject into the skin once a week. wednesday      . naproxen (NAPROSYN) 500 MG tablet Take 500 mg by mouth 2 (two) times daily as needed. For pain      . OMEGA-3 KRILL OIL PO Take by mouth daily.      . RABEprazole (ACIPHEX) 20 MG tablet Take 20 mg by mouth at bedtime.      . sulfamethoxazole-trimethoprim (BACTRIM DS) 800-160 MG per tablet Take 1 tablet by mouth 2 (two) times daily.       No facility-administered encounter medications on file as of 12/29/2012.    Past Psychiatric History/Hospitalization(s): Anxiety: Yes Bipolar Disorder: Yes Depression: Yes Mania: No Psychosis: No Schizophrenia: No Personality Disorder: No Hospitalization for psychiatric illness: Yes History of Electroconvulsive Shock Therapy: No Prior Suicide Attempts: No  Physical Exam: Constitutional:  BP 111/97  Pulse 86  Ht 6' (1.829 m)  Wt 180 lb (81.647 kg)  BMI 24.41 kg/m2  General Appearance: alert, oriented, no acute distress  Musculoskeletal: Strength & Muscle Tone: within normal limits Gait & Station: normal Patient leans: N/A  Review of Systems  Musculoskeletal: Positive for back pain.  Neurological:       Restless  Psychiatric/Behavioral: The patient is nervous/anxious.    Mental status examination Patient is casually dressed and fairly groomed.  He is anxious but maintained a good eye contact.  He described his mood is anxious and his affect is mood appropriate.  He denies any active or passive suicidal thoughts or homicidal  thoughts.  He denies any auditory or visual hallucination.  At times he is restless but there were no tremors or shakes present.  His psychomotor activity is slightly increased.  His thought process is logical linear and goal-directed.  There were no flight of ideas present at this time.  His fund of knowledge is adequate.  His attention and concentration is okay.  He's alert and oriented x3.  His insight judgment and impulse control is okay.   Medical Decision Making (Choose Three): Established Problem, Stable/Improving (1), Review of Psycho-Social Stressors (1), Review of Last Therapy Session (1) and Review of New Medication or Change in Dosage (2)  Assessment: Axis  I:  bipolar disorder1  Axis II:  deferred  Axis III:  see medical history   Patient Active Problem List   Diagnosis Date Noted  . Preop exam for internal medicine 04/02/2012  . Ingrowing toenail with infection 04/02/2012  . Localized osteoarthritis of left knee 04/02/2012  . Vertigo 01/05/2012  . Ataxia 01/05/2012  . Bipolar 1 disorder, depressed, moderate 10/31/2011  . ANXIETY 08/08/2010  . COPD 11/30/2009  . ESOPHAGEAL STRICTURE 11/30/2009  . CHANGE IN BOWELS 11/30/2009  . DEPRESSION 08/07/2009  . OSTEOARTHRITIS 08/07/2009  . GENERALIZED OSTEOARTHROSIS UNSPECIFIED SITE 10/20/2008  . HYPERLIPIDEMIA-MIXED 10/19/2008  . HYPERTENSION, BENIGN 10/19/2008  . FOOT PAIN 07/11/2008  . EPIDYDIMITIS/ORCHITIS 05/24/2008  . FATIGUE 05/14/2008  . RASH-NONVESICULAR 05/14/2008  . SINUSITIS- ACUTE-NOS 03/29/2008  . COUGH 03/29/2008  . CORONARY ATHEROSCLEROSIS 11/04/2007  . Lumbago 11/04/2007  . THYROIDECTOMY, HX OF 11/04/2007  . MALIGNANT NEOPLASM OF ORBIT 10/02/2007  . NEOPLASM, MALIGNANT, THYROID GLAND 10/02/2007  . HYPOGONADISM, MALE 10/02/2007  . 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 IMPAIRED GLUCOSE TOLERANCE 10/02/2007  . SINUSITIS 09/24/2007    Axis IV: Mild to moderate  Axis V: 55-65  Plan:  I will  continue his Ativan, Ambien and Lamictal extended-release at present does.  I recommend to contact his cardiologist if he continues to have variable blood pressure.  This can benefit explain.  Patient is scheduled to see therapist tomorrow.  I will see him in 3 months.  Recommend to call us back if he is any question or concern and worsening of the symptom.    Murial Beam T., MD 12/29/2012    Addendum. Patient called his pharmacy who authorized that he can have Ambien 5 mg #30 tablet in one month.  I will provide a new prescription of Ambien 5 mg one tablet at bedtime with 30 tablet.

## 2012-12-29 NOTE — Telephone Encounter (Signed)
Spoke with pt, Aware of dr crenshaw's recommendations.  °

## 2012-12-29 NOTE — Addendum Note (Signed)
Addended by: Kathryne Sharper T on: 12/29/2012 02:15 PM   Modules accepted: Orders

## 2012-12-30 ENCOUNTER — Ambulatory Visit (INDEPENDENT_AMBULATORY_CARE_PROVIDER_SITE_OTHER): Payer: Commercial Indemnity | Admitting: Licensed Clinical Social Worker

## 2012-12-30 DIAGNOSIS — F3132 Bipolar disorder, current episode depressed, moderate: Secondary | ICD-10-CM
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
## 2013-01-13 ENCOUNTER — Ambulatory Visit (INDEPENDENT_AMBULATORY_CARE_PROVIDER_SITE_OTHER): Payer: Commercial Indemnity | Admitting: Licensed Clinical Social Worker

## 2013-01-13 DIAGNOSIS — F3132 Bipolar disorder, current episode depressed, moderate: Secondary | ICD-10-CM

## 2013-01-13 NOTE — Progress Notes (Signed)
   THERAPIST PROGRESS NOTE  Session Time: 10:30am-11:20am  Participation Level: Active  Behavioral Response: Well GroomedAlertAnxious and Depressed  Type of Therapy: Individual Therapy  Treatment Goals addressed: Coping  Interventions: CBT, DBT, Motivational Interviewing, Strength-based, Supportive and Reframing  Summary: Alex [name] is a 57 y.o. male who presents with depressed mood and anxious affect. He reports ongoing and increased stress at home since his daughter [name] has come home for a week. He describes recent conflict with his wife and remains angry and frustrated that she does not support him the way he needs. He feels alone in the marriage and ganged up on by his wife and children. He has made some improvements in managing his stress by recent trips to kayak and enjoys this. He plans on continuing this and plans to purchase an water proof camera [place]. His sleep is disrupted due to racing thoughts and stress. His appetite is wnl.   Suicidal/Homicidal: Nowithout intent/plan  Therapist Response: Assessed patients current functioning and reviewed progress. Reviewed coping strategies. Assessed patients safety and assisted in identifying protective factors.  Reviewed crisis plan with patient. Assisted patient with the expression of frustration. Reviewed patients self care plan. Assessed progress related to self care. Patients self care is improving. Recommend daily exercise, increased socialization and recreation. Used CBT to assist patient with the identification of negative distortions and irrational thoughts. Encouraged patient to verbalize alternative and factual responses which challenge thought distortions. Used DBT to practice mindfulness, review distraction list and improve distress tolerance skills. Reviewed healthy boundaries and assertive communication. Used motivational interviewing to assist and encourage patient through the change process.  Explored patients barriers to change. Patient continues to struggle with strong co-dependent behavior and learned helplessness. When he is challenged, he struggles to tolerate his anxiety.   Plan: Return again in two weeks.  Diagnosis: Axis I: Bipolar, Depressed    Axis II: Deferred    Abenezer Odonell, LCSW 01/13/2013

## 2013-01-18 ENCOUNTER — Telehealth: Payer: Self-pay | Admitting: Cardiology

## 2013-01-18 NOTE — Telephone Encounter (Signed)
New problem   Pt thinks he needs to be seen today-says he got some weird things going on w/his heart

## 2013-01-18 NOTE — Telephone Encounter (Signed)
Left message on machine for pt to contact the office.  Pt last seen by Dr Jens Som 07/22/11.

## 2013-01-18 NOTE — Telephone Encounter (Signed)
The pt did see Alex [name] in December of 2013 and ETT was recommended but not performed.   I spoke with the pt and he complains of "weird" symptoms for the past couple of weeks.  The pt denies chest pain but does complain of being awaken from sleep in the morning gasping for air and his heart rate increases.  The pt states it "feels like breathing with a wash cloth over my mouth". The pt did kayak this weekend for an hour without any SOB or CP. BP today was 140/108 and the pt is taking NSAIDs for nerve and muscle pain.  Currently the pt is under a lot of stress with family issues and feels like some symptoms could be related to anxiety. At this time the pt would like to wait and see Alex [name] ASAP when he is back in the office.  I instructed the pt to contact the office if he would like to see a different doctor this week or if he has any change in symptoms. I will forward this message to Alex [name] nurse to arrange follow-up for the patient. Pt agreed with plan.

## 2013-01-20 ENCOUNTER — Ambulatory Visit (INDEPENDENT_AMBULATORY_CARE_PROVIDER_SITE_OTHER): Payer: Commercial Indemnity | Admitting: Licensed Clinical Social Worker

## 2013-01-20 DIAGNOSIS — F3132 Bipolar disorder, current episode depressed, moderate: Secondary | ICD-10-CM

## 2013-01-20 NOTE — Progress Notes (Signed)
   THERAPIST PROGRESS NOTE  Session Time: 4:00pm-4:50pm  Participation Level: Active  Behavioral Response: Well GroomedAlertAnxious, Depressed and Irritable  Type of Therapy: Individual Therapy  Treatment Goals addressed: Coping  Interventions: CBT, DBT, Motivational Interviewing, Strength-based, Supportive and Reframing  Summary: Alex [name] is a 57 y.o. male who presents with depressed mood and anxious affect. He reports ongoing anxiety and frustration related to his wife and children. He continues to feel angry over the lack of support and compassion he receives from his family.  He has continued to kayak and finds this helpful and relaxing. He is fearful to take his daughters car away because she may hurt or kill herself. He does acknowledge that he and his wife are enabling their daughter. He processes his fear and how he tries to make decisions based on fear of anticipating anxiety and pain in the future. His sleep is disrupted and his appetite is wnl.   Suicidal/Homicidal: Nowithout intent/plan  Therapist Response: Assessed patients current functioning and reviewed progress. Reviewed coping strategies. Assessed patients safety and assisted in identifying protective factors.  Reviewed crisis plan with patient. Assisted patient with the expression of frustration. Reviewed patients self care plan. Assessed progress related to self care. Patients self care is improving. Recommend daily exercise, increased socialization and recreation. Used CBT to assist patient with the identification of negative distortions and irrational thoughts. Encouraged patient to verbalize alternative and factual responses which challenge thought distortions. Used DBT to practice mindfulness, review distraction list and improve distress tolerance skills. Reviewed healthy boundaries and assertive communication. Used motivational interviewing to assist and encourage patient through the change process. Explored  patients barriers to change. Patient agrees to attend a mens group at church before his next session. Challenged his enabling and processed the consequences of this.   Plan: Return again in two weeks.  Diagnosis: Axis I: Bipolar, Depressed    Axis II: Deferred    Fernando Torry, LCSW 01/20/2013

## 2013-01-26 NOTE — Telephone Encounter (Signed)
Spoke with pt, offered the pt a follow up appt this week and he declined. He has had a family emergency with his son and will not be able to come this week. He reports that with the stress he has been under this week with his children if something was wrong with his heart he would know it. He does feel it maybe related to anxiety. Offered pt follow up later in the month but he declined and states he will call back to schedule a follow up if he has more problems.

## 2013-02-03 ENCOUNTER — Telehealth: Payer: Self-pay | Admitting: Cardiology

## 2013-02-03 ENCOUNTER — Ambulatory Visit (INDEPENDENT_AMBULATORY_CARE_PROVIDER_SITE_OTHER): Payer: Commercial Indemnity | Admitting: Licensed Clinical Social Worker

## 2013-02-03 VITALS — BP 164/108 | HR 95 | Wt 181.8 lb

## 2013-02-03 DIAGNOSIS — F3132 Bipolar disorder, current episode depressed, moderate: Secondary | ICD-10-CM

## 2013-02-03 NOTE — Progress Notes (Signed)
   THERAPIST PROGRESS NOTE  Session Time: 1:00pm-1:50pm  Participation Level: Active  Behavioral Response: Well GroomedAlertAnxious and Depressed  Type of Therapy: Individual Therapy  Treatment Goals addressed: Coping  Interventions: CBT, DBT, Strength-based, Supportive and Reframing  Summary: Alex [name] is a 57 y.o. male who presents with depressed mood and anxious affect. He reports an increase in his anxiety, depression and agitation due to his son's recent suicide attempt. He describes the attempt, his frustration and anger with his wife's attempt to cover up the seriousness of this attempt so their son did not have to receive inpatient psych treatment. He endorses fear and sadness over his son's attempt. He processes his fear over how close his son was to successfully completing suicide. Patient processes anxiety related to his marriage and reports that he is afraid of his wife and avoids doing what he knows is often the best thing to do because he does not want to experience conflict.    Suicidal/Homicidal: Nowithout intent/plan  Therapist Response: Assessed patients current functioning and reviewed progress. Reviewed coping strategies. Assessed patients safety and assisted in identifying protective factors.  Reviewed crisis plan with patient. Assisted patient with the expression of frustration and fear. Reviewed patients self care plan. Assessed progress related to self care. Patients self care is good. Recommend daily exercise, increased socialization and recreation. Reviewed healthy boundaries and assertive communication. Used CBT to assist patient with the identification of negative distortions and irrational thoughts. Encouraged patient to verbalize alternative and factual responses which challenge thought distortions. Used DBT to practice mindfulness, review distraction list and improve distress tolerance skills. Used motivational interviewing to assist and encourage patient  through the change process. Explored patients barriers to change.   Plan: Return again in two weeks.  Diagnosis: Axis I: Bipolar, Depressed    Axis II: No diagnosis    Calysta Craigo, LCSW 02/03/2013

## 2013-02-03 NOTE — Telephone Encounter (Signed)
New Problem:    Patient called in because his BP is still remaining high.  Patient's BP's has been high at his past three appointment's.  Would like to take a 40mg  dose of his Mycardis.  Patient also wanted to know if he could be seen today tomorrow or Friday. Please call back.

## 2013-02-08 ENCOUNTER — Telehealth: Payer: Self-pay | Admitting: *Deleted

## 2013-02-08 NOTE — Telephone Encounter (Signed)
Pt called states he is out of town but he left his Synthroid Rx at home.  He is requesting a weeks worth of Synthroid be called in to [facility] at [phone].

## 2013-02-08 NOTE — Telephone Encounter (Signed)
Synthroid 150mg  #30 called into [facility] as per Dr Posey Rea at [phone]

## 2013-02-08 NOTE — Telephone Encounter (Signed)
Ok #30 Thx

## 2013-02-16 ENCOUNTER — Ambulatory Visit (INDEPENDENT_AMBULATORY_CARE_PROVIDER_SITE_OTHER): Payer: Commercial Indemnity | Admitting: Licensed Clinical Social Worker

## 2013-02-16 DIAGNOSIS — F3132 Bipolar disorder, current episode depressed, moderate: Secondary | ICD-10-CM

## 2013-02-16 NOTE — Progress Notes (Signed)
   THERAPIST PROGRESS NOTE  Session Time: 3:00pm-3:50pm  Participation Level: Active  Behavioral Response: Well GroomedAlertAnxious, Depressed and Hopeless  Type of Therapy: Individual Therapy  Treatment Goals addressed: Coping  Interventions: CBT, DBT, Strength-based, Assertiveness Training, Supportive and Reframing  Summary: Alex [name] is a 57 y.o. male who presents with depressed mood and anxious affect. Patient asked for an earlier appointment due to increased stress.  He reports that his son is sick and that he suspects he tried to kill himself again. He describes ongoing conflict with his wife over her refusal to call  911 and her shame and embarrassment. He did not take control and call on his on, because he reports he is "scared" of his wife. He has not been taking his infusion medication and HTN medication as prescribed and he does acknowledge an attempt to sabotage his health for the secondary gain of attention and affection. He reports that he received attention from his mother only when he was sick as a child. He is overwhelmed and agitated. He processes the multiple stressors he and his family are under. His sleep and appetite are disrupted.   Suicidal/Homicidal: Nowithout intent/plan  Therapist Response: Assessed patients current functioning and reviewed progress. Reviewed coping strategies. Assessed patients safety and assisted in identifying protective factors.  Reviewed crisis plan with patient. Assisted patient with the expression of frustration . Reviewed patients self care plan. Assessed progress related to self care. Patients self care is fair. Recommend daily exercise, increased socialization and recreation. Used CBT to assist patient with the identification of negative distortions and irrational thoughts. Encouraged patient to verbalize alternative and factual responses which challenge thought distortions. Used DBT to practice mindfulness, review distraction list and  improve distress tolerance skills. Reviewed healthy boundaries and assertive communication. Used motivational interviewing to assist and encourage patient through the change process. Explored patients barriers to change.   Plan: Return again in one weeks.  Diagnosis: Axis I: Bipolar, Depressed    Axis II: No diagnosis    Zoriana Oats, LCSW 02/16/2013 3:

## 2013-02-17 ENCOUNTER — Ambulatory Visit (HOSPITAL_COMMUNITY): Payer: Self-pay | Admitting: Licensed Clinical Social Worker

## 2013-02-17 ENCOUNTER — Other Ambulatory Visit: Payer: Self-pay | Admitting: Internal Medicine

## 2013-02-17 ENCOUNTER — Encounter (HOSPITAL_COMMUNITY): Payer: Self-pay | Admitting: Licensed Clinical Social Worker

## 2013-02-17 NOTE — Progress Notes (Signed)
Patient ID: Alex [name], male   DOB: 01/27/1956, 57 y.o.   MRN: [id number] Patient did not attend his session today.

## 2013-02-24 ENCOUNTER — Ambulatory Visit (INDEPENDENT_AMBULATORY_CARE_PROVIDER_SITE_OTHER): Payer: Commercial Indemnity | Admitting: Licensed Clinical Social Worker

## 2013-02-24 DIAGNOSIS — F3132 Bipolar disorder, current episode depressed, moderate: Secondary | ICD-10-CM

## 2013-02-24 NOTE — Progress Notes (Signed)
   THERAPIST PROGRESS NOTE  Session Time: 3:00pm-3:50pm  Participation Level: Active  Behavioral Response: Well GroomedAlertAnxious, Depressed and Irritable  Type of Therapy: Individual Therapy  Treatment Goals addressed: Coping  Interventions: CBT, DBT, Motivational Interviewing, Strength-based, Assertiveness Training, Supportive and Reframing  Summary: Alex [name] is a 57 y.o. male who presents with depressed mood and anxious affect. He reports an increase in his depression, anxiety and feelings of hopelessness. His daughter has returned home from [place] and is causing disruption at home. Patient finds this difficult to tolerate and processes his anger with her and her poor life choices. He endorses feelings of rejection by his wife and his children. His son remains in the hospital and he is upset that a family friend is becoming involved in his life when he does not want this. He endorses passive suicidality by not taking all his blood pressure medication and by not giving himself infusions for his immune system. His behavior is attention seeking. He is able to commit to safety and his family is his protective factor. His sleep and appetite are both disrupted.    Suicidal/Homicidal: Nowithout intent/plan  Therapist Response: Assessed patients current functioning and reviewed progress. Reviewed coping strategies. Assessed patients safety and assisted in identifying protective factors.  Reviewed crisis plan with patient. Assisted patient with the expression of anxiety. Reviewed patients self care plan. Assessed progress related to self care. Patients self care is poor. Recommend daily exercise, increased socialization and recreation. Used CBT to assist patient with the identification of negative distortions and irrational thoughts. Encouraged patient to verbalize alternative and factual responses which challenge thought distortions. Used DBT to practice mindfulness, review distraction  list and improve distress tolerance skills. Reviewed healthy boundaries and assertive communication. Used motivational interviewing to assist and encourage patient through the change process. Explored patients barriers to change.   Plan: Return again in one weeks.  Diagnosis: Axis I: Bipolar, Depressed    Axis II: No diagnosis    Alex Werth, LCSW 02/24/2013

## 2013-02-25 ENCOUNTER — Other Ambulatory Visit (INDEPENDENT_AMBULATORY_CARE_PROVIDER_SITE_OTHER): Payer: Commercial Indemnity

## 2013-02-25 ENCOUNTER — Ambulatory Visit (INDEPENDENT_AMBULATORY_CARE_PROVIDER_SITE_OTHER): Payer: Commercial Indemnity | Admitting: Internal Medicine

## 2013-02-25 ENCOUNTER — Encounter: Payer: Self-pay | Admitting: Internal Medicine

## 2013-02-25 VITALS — BP 158/100 | HR 80 | Temp 98.0°F | Resp 16 | Wt 178.0 lb

## 2013-02-25 DIAGNOSIS — I10 Essential (primary) hypertension: Secondary | ICD-10-CM

## 2013-02-25 DIAGNOSIS — E785 Hyperlipidemia, unspecified: Secondary | ICD-10-CM

## 2013-02-25 DIAGNOSIS — F411 Generalized anxiety disorder: Secondary | ICD-10-CM

## 2013-02-25 LAB — BASIC METABOLIC PANEL
BUN: 15 mg/dL (ref 6–23)
CO2: 26 mEq/L (ref 19–32)
Calcium: 8.8 mg/dL (ref 8.4–10.5)
Chloride: 102 mEq/L (ref 96–112)
Creatinine, Ser: 1 mg/dL (ref 0.4–1.5)
GFR: 84.13 mL/min (ref >60.00)
Glucose, Bld: 100 mg/dL — ABNORMAL HIGH (ref 70–99)
Potassium: 3.8 mEq/L (ref 3.5–5.1)
Sodium: 136 mEq/L (ref 135–145)

## 2013-02-25 MED ORDER — AMLODIPINE BESYLATE 5 MG PO TABS: 5.0000 mg | ORAL_TABLET | Freq: Every day | ORAL | Status: DC

## 2013-02-25 NOTE — Assessment & Plan Note (Addendum)
Added Norvasc 5 mg/d BMET No added salt diet

## 2013-02-25 NOTE — Assessment & Plan Note (Signed)
Worse Continue with current prescription therapy as reflected on the Med list.  

## 2013-02-25 NOTE — Progress Notes (Signed)
Subjective:    Hypertension This is a chronic problem. The current episode started more than 1 year ago. The problem has been rapidly worsening (stressed a lot lately) since onset. Pertinent negatives include no chest pain, neck pain or palpitations.    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  BP Readings from Last 3 Encounters:  02/25/13 158/100  02/03/13 164/108  12/29/12 111/97   Wt Readings from Last 3 Encounters:  02/25/13 178 lb (80.74 kg)  02/03/13 181 lb 12.8 oz (82.464 kg)  12/29/12 180 lb (81.647 kg)   BP 158/100  Pulse 80  Temp(Src) 98 F (36.7 C) (Oral)  Resp 16  Wt 178 lb (80.74 kg)  BMI 24.14 kg/m2  Past Medical History  Diagnosis Date  . Anxiety   . Hypertension   . Hyperlipidemia   . Immune deficiency disorder   . GERD (gastroesophageal reflux disease)   . Glaucoma BOTH EYES -- NO DROPS   . History of thyroid cancer PRIMARY (NO METS FROM ORBITAL CANCER)--   IN REMISSION    S/P TOTAL THYROIDECTOMY  , CHEMORADIATION  (ONCOLOGIST -- DR Arlice Colt)  . 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 Dysuria   . Prostatitis   . Epididymitis, left   . Renal calculi LEFT KIDNEY-- NON-OBSTRUCTIVE  . Depression   . Bipolar I disorder   . Complication of anesthesia POST URINARY RETENTION---  2006 SHOULDER SURGERY MARKED BRADYCARDIA VAGAL RESPONSE NO ISSUE W/ SURGERY AFTER THIS ONE  . BPH (benign prostatic hypertrophy) with urinary obstruction   . Positional  vertigo   . Left flank pain   . Coronary atherosclerosis CARDIOLOGIST- DR CRENSHAW--  LAST VISIT 01-05-2012 IN [facility]    NON-OBSTRUCTIVE MILD DISEASE  . Urinary hesitancy   . Left knee pain CHONDRAL FLAP TEAR   Past Surgical History  Procedure Laterality Date  . Occuloplastic surgery  2002  . Total thyroidectomy  11-03-2001    PAPILLARY THYROID CARCINOMA  . Right supraomohyoid neck dissection   03-08-2003    ZONES 1,2,3;   SUBMANDIBULAR MASS / METASTATIC SQUAMOUS CELL CARCINOMA RIGHT NECK  . Pars plana vitrectomy  11-06-2004    RIGHT EYE RADIATION RETINOPATHY W/ HEMORRHAGE  . Left hydrocelectomy  03-29-2005    AND REPAIR LEFT INGUINAL HERNIA W/ MESH  . Nasal endoscopy  08-07-2005    RIGHT EPISTAXIS  / POST SEPTOPLASTY  (HX RIGHT ORBITAL CA & S/P RADIATION/ NECROSIS ANTERIOR END OF BOTH INFERIOR TURBINATES)  . Cardiac catheterization  01-16-2006  DR THOMAS WALL    MILD CORONARY ATHEROSCLEROSIS/ MID TO DISTAL LAD 40% STENOSIS/ LVF 50-55%  . Shoulder arthroscopy w/ subacromial decompression and distal clavicle excision  10-09-2008    AND DEBRIDEMENT OF RIGHT SHOULDER IMPINGEMENT & [facility] JOINT ARTHRITIS  . Right ankle arthroscopy w/ extensive debridement  04-05-2008   . Left ankle arthroscopy w/ debridement  05-12-2007  . Mohs surgery  2002    RIGHT ORBITAL  CANCER  . Repair undesended right testicle / right inguinal hernia  AGE 57  . Septoplasty  NOV 2006  . Knee arthroscopy  05/01/2012    Procedure: ARTHROSCOPY KNEE;  Surgeon: Javier Docker, MD;  Location: [facility];  Service: Orthopedics;  Laterality: Left;  debridement and removal of loose body    reports that he has never smoked. He has never used smokeless tobacco. He reports that he does not drink alcohol or use illicit drugs. family history includes Depression in his daughter and sister and Drug abuse in his daughter. Allergies  Allergen Reactions  . Percocet (Oxycodone-Acetaminophen) Itching    Can take generic.   States only has a problem with percocet brand     Current Outpatient Prescriptions on File Prior to Visit  Medication Sig Dispense Refill  . aspirin EC 81 MG tablet Take 1 tablet (81 mg total) by mouth daily.      . Coenzyme Q10 (CO Q 10 PO) Take by mouth daily.      [name] guaiFENesin (MUCINEX) 600 MG 12 hr tablet Take 600 mg by mouth 2 (two) times daily as needed. For congestion      . Immune Globulin, Human, (HIZENTRA) 4 GM/20ML SOLN Inject into the skin once a week. wednesday      . LamoTRIgine (LAMICTAL XR) 300 MG TB24 Take 1 tablet (300 mg total) by mouth daily.  90 tablet  0  . LORazepam (ATIVAN) 0.5 MG tablet TAKE 1 TABLET BY MOUTH TWICE A DAY AS NEEDED FOR ANXIETY  60 tablet  2  . naproxen (NAPROSYN) 500 MG tablet Take 500 mg by mouth 2 (two) times daily as needed. For pain      . OMEGA-3 KRILL OIL PO Take by mouth daily.      . RABEprazole (ACIPHEX) 20 MG tablet Take 20 mg by mouth at bedtime.      [name] SYNTHROID 150 MCG tablet TAKE 1 TABLET AT BEDTIME  90 tablet  0  . Tamsulosin HCl (FLOMAX) 0.4 MG CAPS Take 0.4 mg by mouth as needed.      [name] telmisartan (MICARDIS) 80 MG tablet Take 1 tablet (80 mg total) by mouth daily.  90 tablet  3  . terbinafine (LAMISIL) 250 MG tablet Take 250 mg by mouth every morning.       . Testosterone (AXIRON) 30 MG/ACT SOLN Place 2 Act onto the skin every morning.      [name] atorvastatin (LIPITOR) 40 MG tablet Take 40 mg by mouth at bedtime.      . hydroxypropyl methylcellulose (ISOPTO TEARS) 2.5 % ophthalmic solution Place 1 drop into both eyes 4 (four) times daily as needed. For dry eyes      . sulfamethoxazole-trimethoprim (BACTRIM DS) 800-160 MG per tablet Take 1 tablet by mouth 2 (two) times daily.       No current facility-administered medications on file prior to visit.     Review of Systems  Constitutional: Negative for appetite change, fatigue and unexpected weight change.  HENT: Negative for nosebleeds, congestion, sore throat, sneezing, trouble  swallowing and neck pain.   Eyes: Negative for itching and visual disturbance.  Respiratory: Negative for cough.   Cardiovascular: Negative for chest pain, palpitations and leg swelling.  Gastrointestinal: Negative for nausea, diarrhea, blood in stool and abdominal distention.  Genitourinary: Negative for frequency and hematuria.  Musculoskeletal: Negative for back pain, joint swelling and gait problem.  Skin: Negative for rash.  Neurological: Negative for dizziness, tremors, speech  difficulty and weakness.  Psychiatric/Behavioral: Negative for suicidal ideas, sleep disturbance, dysphoric mood and agitation. The patient is nervous/anxious.        Objective:   Physical Exam  Constitutional: He is oriented to person, place, and time. He appears well-developed. No distress.  HENT:  Mouth/Throat: Oropharynx is clear and moist.  Eyes: Conjunctivae are normal. Pupils are equal, round, and reactive to light.  Neck: Normal range of motion. No JVD present. No thyromegaly present.  Cardiovascular: Normal rate, regular rhythm, normal heart sounds and intact distal pulses.  Exam reveals no gallop and no friction rub.   No murmur heard. Pulmonary/Chest: Effort normal and breath sounds normal. No respiratory distress. He has no wheezes. He has no rales. He exhibits no tenderness.  Abdominal: Soft. Bowel sounds are normal. He exhibits no distension and no mass. There is no tenderness. There is no rebound and no guarding.  Genitourinary: Rectum normal and prostate normal. Guaiac negative stool.  Testes nl  Musculoskeletal: Normal range of motion. He exhibits no edema and no tenderness.  Lymphadenopathy:    He has no cervical adenopathy.  Neurological: He is alert and oriented to person, place, and time. He has normal reflexes. No cranial nerve deficit. He exhibits normal muscle tone. Coordination normal.  Skin: Skin is warm and dry. No rash noted.  Psychiatric: He has a normal mood and affect. His  behavior is normal. Judgment and thought content normal.  anxious   Lab Results  Component Value Date   WBC 8.7 04/02/2012   HGB 18.4* 05/01/2012   HCT 54.0* 05/01/2012   PLT 264.0 04/02/2012   GLUCOSE 92 08/04/2012   CHOL 162 07/27/2012   TRIG 50.0 07/27/2012   HDL 74.40 07/27/2012   LDLDIRECT 134.7 03/05/2007   LDLCALC 78 07/27/2012   ALT 36 07/27/2012   AST 21 07/27/2012   NA 136 08/04/2012   K 4.4 08/04/2012   CL 100 08/04/2012   CREATININE 1.2 08/04/2012   BUN 22 08/04/2012   CO2 26 08/04/2012   TSH 6.76* 08/04/2012   PSA 0.82 06/22/2010   HGBA1C 6.1 08/04/2012          Assessment & Plan:

## 2013-02-25 NOTE — Patient Instructions (Signed)
No added salt diet

## 2013-02-25 NOTE — Assessment & Plan Note (Signed)
Continue with current prescription therapy as reflected on the Med list.  

## 2013-03-02 ENCOUNTER — Other Ambulatory Visit: Payer: Self-pay | Admitting: Internal Medicine

## 2013-03-02 ENCOUNTER — Ambulatory Visit (HOSPITAL_COMMUNITY): Payer: Self-pay | Admitting: Psychiatry

## 2013-03-03 ENCOUNTER — Ambulatory Visit (INDEPENDENT_AMBULATORY_CARE_PROVIDER_SITE_OTHER): Payer: Commercial Indemnity | Admitting: Licensed Clinical Social Worker

## 2013-03-03 DIAGNOSIS — F3132 Bipolar disorder, current episode depressed, moderate: Secondary | ICD-10-CM

## 2013-03-03 NOTE — Progress Notes (Signed)
   THERAPIST PROGRESS NOTE  Session Time: 1:00pm-1:50pm  Participation Level: Active  Behavioral Response: Well GroomedAlertAnxious, Depressed and Irritable  Type of Therapy: Individual Therapy  Treatment Goals addressed: Coping  Interventions: CBT, DBT, Motivational Interviewing, Strength-based, Supportive and Reframing  Summary: XAIVIER [name] is a 57 y.o. male who presents with depressed mood and anxious affect. He reports ongoing stress at home and processes his disbelief that his daughter [name] is at home. Neither he nor his wife want her living with them, but he does not know what to do about it. He endorses feelings of being overwhelmed and agitated. He is pleased that he and his wife are communicating better and are on the same page regarding their daughter. He is challenged by his wife's friends involvement in his son's life. His sleep is disrupted by anxiety and his appetite is wnl.    Suicidal/Homicidal: Nowithout intent/plan  Therapist Response: Assessed patients current functioning and reviewed progress. Reviewed coping strategies. Assessed patients safety and assisted in identifying protective factors.  Reviewed crisis plan with patient. Assisted patient with the expression of anxiety. Reviewed patients self care plan. Assessed progress related to self care. Patients self care is good. Recommend daily exercise, increased socialization and recreation. Used CBT to assist patient with the identification of negative distortions and irrational thoughts. Encouraged patient to verbalize alternative and factual responses which challenge thought distortions. Used DBT to practice mindfulness, review distraction list and improve distress tolerance skills. Used motivational interviewing to assist and encourage patient through the change process. Explored patients barriers to change.   Plan: Return again in one to two weeks.  Diagnosis: Axis I: Bipolar, Depressed    Axis II: No  diagnosis    Rafik Koppel, LCSW 03/03/2013

## 2013-03-17 ENCOUNTER — Ambulatory Visit (INDEPENDENT_AMBULATORY_CARE_PROVIDER_SITE_OTHER): Payer: Commercial Indemnity | Admitting: Licensed Clinical Social Worker

## 2013-03-17 DIAGNOSIS — F3132 Bipolar disorder, current episode depressed, moderate: Secondary | ICD-10-CM

## 2013-03-17 NOTE — Progress Notes (Signed)
   THERAPIST PROGRESS NOTE  Session Time: 1:00pm-1:50pm  Participation Level: Active  Behavioral Response: Well GroomedAlertAnxious, Depressed, Hopeless and Worthless  Type of Therapy: Individual Therapy  Treatment Goals addressed: Coping  Interventions: CBT, DBT, Motivational Interviewing, Strength-based, Assertiveness Training, Supportive and Reframing  Summary: Alex [name] is a 57 y.o. male who presents with depressed mood and anxious affect. His arm is in a sling and he reports falling down a flight of stairs. He endorses ongoing stress related to his family. He processes his anger towards his daughter [name] and that he feels trapped and does not want her in the home, but does not know what to do. He wants his wife to demonstrate love and compassion which she does not. He continues to avoid giving himself the infusions he needs. He is coy when pressed for reasons why he is choosing not to take care of himself. He admits that since his family does not care about him, that he does not care as well. He remains ambivalent about his self care and endorses passive suicidality through poor medication compliance. He denies any active suicidal ideation, intent or plan. He is able to clearly state his family as a protective factor.    Suicidal/Homicidal: Nowithout intent/plan  Therapist Response: Assessed patients current functioning and reviewed progress. Reviewed coping strategies. Assessed patients safety and assisted in identifying protective factors.  Reviewed crisis plan with patient. Assisted patient with the expression of frustration. Reviewed patients self care plan. Assessed progress related to self care. Patients self care is fair. Recommend daily exercise, increased socialization and recreation. Used CBT to assist patient with the identification of negative distortions and irrational thoughts. Encouraged patient to verbalize alternative and factual responses which challenge thought  distortions. Reviewed healthy boundaries and assertive communication. Used DBT to practice mindfulness, review distraction list and improve distress tolerance skills. Used motivational interviewing to assist and encourage patient through the change process. Explored patients barriers to change.   Plan: Return again in two weeks.  Diagnosis: Axis I: Bipolar, Depressed    Axis II: No diagnosis    Megahn Killings, LCSW 03/17/2013

## 2013-03-19 ENCOUNTER — Other Ambulatory Visit (HOSPITAL_COMMUNITY): Payer: Self-pay | Admitting: Psychiatry

## 2013-03-25 ENCOUNTER — Other Ambulatory Visit (HOSPITAL_COMMUNITY): Payer: Self-pay | Admitting: Psychiatry

## 2013-03-25 DIAGNOSIS — F39 Unspecified mood [affective] disorder: Secondary | ICD-10-CM

## 2013-03-25 DIAGNOSIS — F319 Bipolar disorder, unspecified: Secondary | ICD-10-CM

## 2013-03-25 MED ORDER — LAMOTRIGINE ER 300 MG PO TB24: 300.0000 mg | ORAL_TABLET | Freq: Every day | ORAL | Status: DC

## 2013-03-26 ENCOUNTER — Other Ambulatory Visit (INDEPENDENT_AMBULATORY_CARE_PROVIDER_SITE_OTHER): Payer: Commercial Indemnity

## 2013-03-26 ENCOUNTER — Encounter: Payer: Self-pay | Admitting: Internal Medicine

## 2013-03-26 ENCOUNTER — Telehealth: Payer: Self-pay | Admitting: Internal Medicine

## 2013-03-26 ENCOUNTER — Ambulatory Visit (INDEPENDENT_AMBULATORY_CARE_PROVIDER_SITE_OTHER)
Admission: RE | Admit: 2013-03-26 | Discharge: 2013-03-26 | Disposition: A | Discharge status: Home / Self Care | Payer: Commercial Indemnity | Source: Ambulatory Visit | Attending: Internal Medicine | Admitting: Internal Medicine

## 2013-03-26 ENCOUNTER — Ambulatory Visit (INDEPENDENT_AMBULATORY_CARE_PROVIDER_SITE_OTHER): Payer: Commercial Indemnity | Admitting: Internal Medicine

## 2013-03-26 VITALS — BP 132/90 | HR 105 | Temp 101.7°F | Ht 72.0 in | Wt 184.4 lb

## 2013-03-26 DIAGNOSIS — R059 Cough, unspecified: Secondary | ICD-10-CM

## 2013-03-26 DIAGNOSIS — I10 Essential (primary) hypertension: Secondary | ICD-10-CM

## 2013-03-26 DIAGNOSIS — R509 Fever, unspecified: Secondary | ICD-10-CM

## 2013-03-26 DIAGNOSIS — R05 Cough: Secondary | ICD-10-CM

## 2013-03-26 DIAGNOSIS — R7309 Other abnormal glucose: Secondary | ICD-10-CM

## 2013-03-26 LAB — URINALYSIS, ROUTINE W REFLEX MICROSCOPIC
Bilirubin Urine: NEGATIVE
Ketones, ur: NEGATIVE
Nitrite: NEGATIVE
Specific Gravity, Urine: 1.02 (ref 1.000–1.030)
Total Protein, Urine: NEGATIVE
Urine Glucose: NEGATIVE
Urobilinogen, UA: 0.2 (ref 0.0–1.0)
pH: 6 (ref 5.0–8.0)

## 2013-03-26 LAB — HEPATIC FUNCTION PANEL
ALT: 30 U/L (ref 0–53)
AST: 26 U/L (ref 0–37)
Albumin: 3.6 g/dL (ref 3.5–5.2)
Alkaline Phosphatase: 65 U/L (ref 39–117)
Bilirubin, Direct: 0.2 mg/dL (ref 0.0–0.3)
Total Bilirubin: 1 mg/dL (ref 0.3–1.2)
Total Protein: 6.5 g/dL (ref 6.0–8.3)

## 2013-03-26 LAB — BASIC METABOLIC PANEL
BUN: 13 mg/dL (ref 6–23)
CO2: 24 mEq/L (ref 19–32)
Calcium: 8.2 mg/dL — ABNORMAL LOW (ref 8.4–10.5)
Chloride: 99 mEq/L (ref 96–112)
Creatinine, Ser: 1 mg/dL (ref 0.4–1.5)
GFR: 84.11 mL/min (ref >60.00)
Glucose, Bld: 150 mg/dL — ABNORMAL HIGH (ref 70–99)
Potassium: 3.9 mEq/L (ref 3.5–5.1)
Sodium: 131 mEq/L — ABNORMAL LOW (ref 135–145)

## 2013-03-26 MED ORDER — AMOXICILLIN-POT CLAVULANATE 875-125 MG PO TABS: 1.0000 | ORAL_TABLET | Freq: Two times a day (BID) | ORAL | Status: DC

## 2013-03-26 MED ORDER — LEVOFLOXACIN 250 MG PO TABS: 250.0000 mg | ORAL_TABLET | Freq: Every day | ORAL | Status: DC

## 2013-03-26 NOTE — Assessment & Plan Note (Signed)
Unclear etiology, suspect primary is acute bronchitis, mod, for antibx course,  to f/u any worsening symptoms or concerns, and labs today to eval otherwise as well  Including cbc and ua

## 2013-03-26 NOTE — Telephone Encounter (Signed)
Ok change to augmentin  - done erx

## 2013-03-26 NOTE — Telephone Encounter (Signed)
Patient Information:  Caller Name: Alex [name]  Phone: [phone]  Patient: Alex [name], Alex [name]  Gender: Male  DOB: 1956-03-25  Age: 57 Years  PCP: Plotnikov, Alex (Adults only)  Office Follow Up:  Does the office need to follow up with this patient?: No  Instructions For The Office: N/A  RN Note:  Diagnosed with radial head fracture 03/08/13.  Concerned about flu symptoms with headache, dizziness, fever, chills, fatigue, and body aches. Denies cold symptoms, sore throat or cough. No diarrhea.  Reports immune deficiency, CVID.  Symptoms  Reason For Call & Symptoms: Suspected "flu" with chills, body aches, stomach cramping, fatigue, chest aching and posterior headache. Temp 103 during the night.  Symptoms began after returned from [place].  Reviewed Health History In EMR: Yes  Reviewed Medications In EMR: Yes  Reviewed Allergies In EMR: Yes  Reviewed Surgeries / Procedures: Yes  Date of Onset of Symptoms: 03/24/2013  Treatments Tried: Tylenol, Motrin  Treatments Tried Worked: Yes  Any Fever: Yes  Fever Taken: Oral  Fever Time Of Reading: 08:30:00  Fever Last Reading: 102  Guideline(s) Used:  Influenza - Seasonal  Disposition Per Guideline:   Go to Office Now  Reason For Disposition Reached:   Fever > 100.5 F (38.1 C) and diabetes mellitus or weak immune system (e.g., HIV positive, cancer chemo, splenectomy, organ transplant, chronic steroids)  Advice Given:  Treating the Symptoms of Flu  Fever, Muscle Aches, and Headache: For fever more than 101 F (38.3 C), muscle aches, and headaches, take acetaminophen every 4-6 hours (Adults 650 mg) OR ibuprofen every 6-8 hours (Adults 400-600 mg).  Hydrate: Drink extra liquids. If the air in your home is dry, use a humidifier.  No Aspirin  : Do not use aspirin for treatment of fever or pain (Reason: there is an association between influenza and Reye syndrome).  Isolation is Needed Until After the Fever is Gone:   The CDC recommends that  people with influenza-like illness remain at home until at least 24 hours after they are free of fever (100 F or 37.8C).  Do NOT go to work or school.  Do NOT go to church, child care centers, shopping, or other public places.  Do NOT shake hands.  Avoid close contact with others (hugging, kissing).  Expected Course  : The fever lasts 2-3 days, the runny nose 5-10 days, and the cough 2-3 weeks.  Call Back If:  Fever lasts more than 3 days  You become short of breath or worse.  Patient Will Follow Care Advice:  YES  Appointment Scheduled:  03/26/2013 14:30:00 Appointment Scheduled Provider:  Oliver Barre (Adults only)

## 2013-03-26 NOTE — Patient Instructions (Signed)
Please take all new medication as prescribed - the antibiotic Please continue all other medications as before Please go to the XRAY Department in the Basement (go straight as you get off the elevator) for the x-ray testing Please go to the LAB in the Basement (turn left off the elevator) for the tests to be done today You will be contacted by phone if any changes need to be made immediately.  Otherwise, you will receive a letter about your results with an explanation, but please check with MyChart first.  Please remember to sign up for My Chart if you have not done so, as this will be important to you in the future with finding out test results, communicating by private email, and scheduling acute appointments online when needed.

## 2013-03-26 NOTE — Telephone Encounter (Signed)
Message copied by Corwin Levins on Fri Mar 26, 2013  5:11 PM ------      Message from: Scharlene Gloss B      Created: Fri Mar 26, 2013  4:49 PM       The patient would like antibiotic changed as levaquin has caused problems in the past for his BPH problems.   ------

## 2013-03-26 NOTE — Progress Notes (Signed)
Subjective:    Patient ID: Alex [name], male    DOB: 1956-02-08, 57 y.o.   MRN: [id number]  HPI  Here with acute onset fever, diffuse body aches, temp 103 earlier today, mild occaional nonprod cough, but Pt denies chest pain, increased sob or doe, wheezing, orthopnea, PND, increased LE swelling, palpitations, dizziness or syncope.  Pt denies new neurological symptoms such as new headache, or facial or extremity weakness or numbness   Pt denies polydipsia, polyuria.  Has of immune deficiency and COPD Past Medical History  Diagnosis Date  . Anxiety   . Hypertension   . Hyperlipidemia   . Immune deficiency disorder   . GERD (gastroesophageal reflux disease)   . Glaucoma BOTH EYES -- NO DROPS   . History of thyroid cancer PRIMARY (NO METS FROM ORBITAL CANCER)--   IN REMISSION    S/P TOTAL THYROIDECTOMY  , CHEMORADIATION  (ONCOLOGIST -- DR Arlice Colt)  . 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 Dysuria   . Prostatitis   . Epididymitis, left   . Renal calculi LEFT KIDNEY-- NON-OBSTRUCTIVE  . Depression   . Bipolar I disorder   . Complication of anesthesia POST URINARY RETENTION---  2006 SHOULDER SURGERY MARKED BRADYCARDIA VAGAL RESPONSE NO ISSUE W/ SURGERY AFTER THIS ONE  . BPH (benign prostatic hypertrophy) with urinary obstruction   . Positional vertigo   . Left flank pain   . Coronary atherosclerosis CARDIOLOGIST- DR CRENSHAW--  LAST VISIT 01-05-2012 IN [facility]    NON-OBSTRUCTIVE MILD DISEASE  . Urinary hesitancy   . Left knee pain CHONDRAL FLAP TEAR   Past Surgical History  Procedure Laterality Date  . Occuloplastic surgery  2002  . Total thyroidectomy  11-03-2001    PAPILLARY THYROID CARCINOMA  . Right supraomohyoid neck dissection   03-08-2003    ZONES 1,2,3;   SUBMANDIBULAR MASS / METASTATIC SQUAMOUS CELL CARCINOMA  RIGHT NECK  . Pars plana vitrectomy  11-06-2004    RIGHT EYE RADIATION RETINOPATHY W/ HEMORRHAGE  . Left hydrocelectomy  03-29-2005    AND REPAIR LEFT INGUINAL HERNIA W/ MESH  . Nasal endoscopy  08-07-2005    RIGHT EPISTAXIS  / POST SEPTOPLASTY  (HX RIGHT ORBITAL CA & S/P RADIATION/ NECROSIS ANTERIOR END OF BOTH INFERIOR TURBINATES)  . Cardiac catheterization  01-16-2006  DR THOMAS WALL    MILD CORONARY ATHEROSCLEROSIS/ MID TO DISTAL LAD 40% STENOSIS/ LVF 50-55%  . Shoulder arthroscopy w/ subacromial decompression and distal clavicle excision  10-09-2008    AND DEBRIDEMENT OF RIGHT SHOULDER IMPINGEMENT & [facility] JOINT ARTHRITIS  . Right ankle arthroscopy w/ extensive debridement  04-05-2008   . Left ankle arthroscopy w/ debridement  05-12-2007  . Mohs surgery  2002    RIGHT ORBITAL CANCER  . Repair undesended right testicle / right inguinal hernia  AGE 32  . Septoplasty  NOV 2006  . Knee arthroscopy  05/01/2012    Procedure: ARTHROSCOPY KNEE;  Surgeon: Javier Docker, MD;  Location: [facility];  Service: Orthopedics;  Laterality: Left;  debridement and removal of loose body    reports that he has never smoked. He has never used smokeless tobacco. He reports that he does not drink alcohol or use illicit drugs. family history includes Depression in his daughter and  sister; Drug abuse in his daughter. Allergies  Allergen Reactions  . Percocet [Oxycodone-Acetaminophen] Itching    Can take generic.  States only has a problem with percocet brand   Current Outpatient Prescriptions on File Prior to Visit  Medication Sig Dispense Refill  . amLODipine (NORVASC) 5 MG tablet Take 1 tablet (5 mg total) by mouth daily. Take at hs  90 tablet  3  . aspirin EC 81 MG tablet Take 1 tablet (81 mg total) by mouth daily.      . Coenzyme Q10 (CO Q 10 PO) Take by mouth daily.      [name] guaiFENesin (MUCINEX) 600 MG 12 hr tablet Take 600 mg by mouth 2 (two) times daily as needed. For congestion      .  hydroxypropyl methylcellulose (ISOPTO TEARS) 2.5 % ophthalmic solution Place 1 drop into both eyes 4 (four) times daily as needed. For dry eyes      . Immune Globulin, Human, (HIZENTRA) 4 GM/20ML SOLN Inject into the skin once a week. wednesday      . LamoTRIgine (LAMICTAL XR) 300 MG TB24 Take 1 tablet (300 mg total) by mouth daily.  90 tablet  0  . LORazepam (ATIVAN) 0.5 MG tablet TAKE 1 TABLET BY MOUTH TWICE A DAY AS NEEDED FOR ANXIETY  60 tablet  0  . naproxen (NAPROSYN) 500 MG tablet Take 500 mg by mouth 2 (two) times daily as needed. For pain      . OMEGA-3 KRILL OIL PO Take by mouth daily.      . RABEprazole (ACIPHEX) 20 MG tablet TAKE 1 TABLET BY MOUTH EVERY DAY  90 tablet  2  . SYNTHROID 150 MCG tablet TAKE 1 TABLET AT BEDTIME  90 tablet  0  . Tamsulosin HCl (FLOMAX) 0.4 MG CAPS Take 0.4 mg by mouth as needed.      [name] telmisartan (MICARDIS) 80 MG tablet Take 1 tablet (80 mg total) by mouth daily.  90 tablet  3  . terbinafine (LAMISIL) 250 MG tablet Take 250 mg by mouth every morning.       . Testosterone (AXIRON) 30 MG/ACT SOLN Place 2 Act onto the skin every morning.      . zolpidem (AMBIEN) 5 MG tablet Take 2.5 mg by mouth at bedtime as needed. TAKE 1 TABLET BY MOUTH AT BEDTIME AS NEEDED      . atorvastatin (LIPITOR) 40 MG tablet Take 40 mg by mouth at bedtime.       No current facility-administered medications on file prior to visit.   Review of Systems  Constitutional: Negative for unexpected weight change, or unusual diaphoresis  HENT: Negative for tinnitus.   Eyes: Negative for photophobia and visual disturbance.  Respiratory: Negative for choking and stridor.   Gastrointestinal: Negative for vomiting and blood in stool.  Genitourinary: Negative for hematuria and decreased urine volume.  Musculoskeletal: Negative for acute joint swelling Skin: Negative for color change and wound.  Neurological: Negative for tremors and numbness other than noted  Psychiatric/Behavioral:  Negative for decreased concentration or  hyperactivity.       Objective:   Physical Exam BP 132/90  Pulse 105  Temp(Src) 101.7 F (38.7 C) (Oral)  Ht 6' (1.829 m)  Wt 184 lb 6 oz (83.632 kg)  BMI 25 kg/m2  SpO2 94% VS noted, mild to mod ill appaering, diaphoretic Constitutional: Pt appears well-developed and well-nourished.  HENT: Head: NCAT.  Right Ear: External ear normal.  Left Ear: External ear normal.  Bilat tm's with slight erythema only.  Max sinus areas non tender.  Pharynx with minor erythema, no exudate Eyes: Conjunctivae and EOM are normal. Pupils are equal, round, and reactive to light.  Neck: Normal range of motion. Neck supple.  Cardiovascular: Normal rate and regular rhythm.   Pulmonary/Chest: Effort normal and breath sounds normal.  - no rales or wheezing Neurological: Pt is alert. Not confused  Skin: Skin is warm. No erythema.  Psychiatric: Pt behavior is normal. Thought content normal. mild     Assessment & Plan:

## 2013-03-26 NOTE — Assessment & Plan Note (Signed)
?   Clinical signficance, only minor, for  cxr given the fever and illness

## 2013-03-28 NOTE — Assessment & Plan Note (Signed)
stable overall by history and exam, recent data reviewed with pt, and pt to continue medical treatment as before,  to f/u any worsening symptoms or concerns BP Readings from Last 3 Encounters:  03/26/13 132/90  02/25/13 158/100  02/03/13 164/108

## 2013-03-28 NOTE — Assessment & Plan Note (Signed)
stable overall by history and exam, recent data reviewed with pt, and pt to continue medical treatment as before,  to f/u any worsening symptoms or concerns Lab Results  Component Value Date   HGBA1C 6.1 08/04/2012

## 2013-03-29 ENCOUNTER — Telehealth: Payer: Self-pay

## 2013-03-29 ENCOUNTER — Other Ambulatory Visit: Payer: Self-pay | Admitting: Internal Medicine

## 2013-03-29 DIAGNOSIS — Z0000 Encounter for general adult medical examination without abnormal findings: Secondary | ICD-10-CM

## 2013-03-29 DIAGNOSIS — J189 Pneumonia, unspecified organism: Secondary | ICD-10-CM

## 2013-03-29 LAB — CBC WITH DIFFERENTIAL/PLATELET
Basophils Absolute: 0 10*3/uL (ref 0.0–0.1)
Basophils Relative: 0.2 % (ref 0.0–3.0)
Eosinophils Absolute: 0 10*3/uL (ref 0.0–0.7)
Eosinophils Relative: 0.3 % (ref 0.0–5.0)
HCT: 41.7 % (ref 39.0–52.0)
Hemoglobin: 14.2 g/dL (ref 13.0–17.0)
Lymphocytes Relative: 6.1 % — ABNORMAL LOW (ref 12.0–46.0)
Lymphs Abs: 0.8 10*3/uL (ref 0.7–4.0)
MCHC: 34 g/dL (ref 30.0–36.0)
MCV: 82.4 fl (ref 78.0–100.0)
Monocytes Absolute: 1.5 10*3/uL — ABNORMAL HIGH (ref 0.1–1.0)
Monocytes Relative: 11.1 % (ref 3.0–12.0)
Neutro Abs: 11 10*3/uL — ABNORMAL HIGH (ref 1.4–7.7)
Neutrophils Relative %: 82.3 % — ABNORMAL HIGH (ref 43.0–77.0)
Platelets: 247 10*3/uL (ref 150.0–400.0)
RBC: 5.06 Mil/uL (ref 4.22–5.81)
RDW: 15.7 % — ABNORMAL HIGH (ref 11.5–14.6)
WBC: 13.4 10*3/uL — ABNORMAL HIGH (ref 4.5–10.5)

## 2013-03-29 NOTE — Telephone Encounter (Signed)
Called the patient to inform how he was doing.  He has tolerated antibiotic well and had 5 doses at this point.  Has had fever on and off all weekend, but the last 18 hours seems to be getting better.   Mostly sleeping all weekend, has a cough (none productive) now.  He would like to put in an order for a chest xray for Wednesday as he will be in the area and can come by to do.

## 2013-03-29 NOTE — Telephone Encounter (Signed)
Ok, order done.

## 2013-03-29 NOTE — Telephone Encounter (Signed)
Called the patient left message to call back 

## 2013-03-31 ENCOUNTER — Encounter: Payer: Self-pay | Admitting: Internal Medicine

## 2013-03-31 ENCOUNTER — Ambulatory Visit (INDEPENDENT_AMBULATORY_CARE_PROVIDER_SITE_OTHER)
Admission: RE | Admit: 2013-03-31 | Discharge: 2013-03-31 | Disposition: A | Discharge status: Home / Self Care | Payer: Commercial Indemnity | Source: Ambulatory Visit | Attending: Internal Medicine | Admitting: Internal Medicine

## 2013-03-31 ENCOUNTER — Ambulatory Visit (INDEPENDENT_AMBULATORY_CARE_PROVIDER_SITE_OTHER): Payer: Commercial Indemnity | Admitting: Psychiatry

## 2013-03-31 ENCOUNTER — Encounter (HOSPITAL_COMMUNITY): Payer: Self-pay | Admitting: Psychiatry

## 2013-03-31 ENCOUNTER — Ambulatory Visit (INDEPENDENT_AMBULATORY_CARE_PROVIDER_SITE_OTHER): Payer: Commercial Indemnity | Admitting: Internal Medicine

## 2013-03-31 ENCOUNTER — Ambulatory Visit: Payer: Self-pay | Admitting: Internal Medicine

## 2013-03-31 VITALS — BP 134/91 | HR 88 | Ht 72.0 in | Wt 179.0 lb

## 2013-03-31 VITALS — BP 142/92 | HR 80 | Resp 16 | Wt 179.0 lb

## 2013-03-31 DIAGNOSIS — F39 Unspecified mood [affective] disorder: Secondary | ICD-10-CM

## 2013-03-31 DIAGNOSIS — J189 Pneumonia, unspecified organism: Secondary | ICD-10-CM

## 2013-03-31 DIAGNOSIS — J449 Chronic obstructive pulmonary disease, unspecified: Secondary | ICD-10-CM

## 2013-03-31 DIAGNOSIS — F319 Bipolar disorder, unspecified: Secondary | ICD-10-CM

## 2013-03-31 MED ORDER — ZOLPIDEM TARTRATE 5 MG PO TABS: ORAL_TABLET | ORAL | Status: DC

## 2013-03-31 MED ORDER — UMECLIDINIUM-VILANTEROL 62.5-25 MCG/INH IN AEPB: 1.0000 | INHALATION_SPRAY | Freq: Every day | RESPIRATORY_TRACT | Status: DC

## 2013-03-31 MED ORDER — LORAZEPAM 0.5 MG PO TABS: ORAL_TABLET | ORAL | Status: DC

## 2013-03-31 NOTE — Assessment & Plan Note (Signed)
8/14 CXR: RML pneumonia 8/15; a little worse today; clinically better  Finish abx. RTC if worse Repeat CXR in 2 mo

## 2013-03-31 NOTE — Progress Notes (Signed)
Subjective:    HPI  Here for a f/u of pneumonia - 8 d on abx. He was seen prior with acute onset fever, diffuse body aches, temp 103 earlier today, mild occaional nonprod cough. Doing better. H/o of immune deficiency and COPD Past Medical History  Diagnosis Date  . Anxiety   . Hypertension   . Hyperlipidemia   . Immune deficiency disorder   . GERD (gastroesophageal reflux disease)   . Glaucoma BOTH EYES -- NO DROPS   . History of thyroid cancer PRIMARY (NO METS FROM ORBITAL CANCER)--   IN REMISSION    S/P TOTAL THYROIDECTOMY  , CHEMORADIATION  (ONCOLOGIST -- DR Arlice Colt)  . 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 Dysuria   . Prostatitis   . Epididymitis, left   . Renal calculi LEFT KIDNEY-- NON-OBSTRUCTIVE  . Depression   . Bipolar I disorder   . Complication of anesthesia POST URINARY RETENTION---  2006 SHOULDER SURGERY MARKED BRADYCARDIA VAGAL RESPONSE NO ISSUE W/ SURGERY AFTER THIS ONE  . BPH (benign prostatic hypertrophy) with urinary obstruction   . Positional vertigo   . Left flank pain   . Coronary atherosclerosis CARDIOLOGIST- DR CRENSHAW--  LAST VISIT 01-05-2012 IN [facility]    NON-OBSTRUCTIVE MILD DISEASE  . Urinary hesitancy   . Left knee pain CHONDRAL FLAP TEAR   Past Surgical History  Procedure Laterality Date  . Occuloplastic surgery  2002  . Total thyroidectomy  11-03-2001    PAPILLARY THYROID CARCINOMA  . Right supraomohyoid neck dissection   03-08-2003    ZONES 1,2,3;   SUBMANDIBULAR MASS / METASTATIC SQUAMOUS CELL CARCINOMA RIGHT NECK  . Pars plana vitrectomy  11-06-2004    RIGHT EYE RADIATION RETINOPATHY W/ HEMORRHAGE  . Left hydrocelectomy  03-29-2005    AND REPAIR LEFT INGUINAL HERNIA W/ MESH  . Nasal endoscopy  08-07-2005    RIGHT EPISTAXIS  / POST SEPTOPLASTY  (HX RIGHT ORBITAL CA & S/P  RADIATION/ NECROSIS ANTERIOR END OF BOTH INFERIOR TURBINATES)  . Cardiac catheterization  01-16-2006  DR THOMAS WALL    MILD CORONARY ATHEROSCLEROSIS/ MID TO DISTAL LAD 40% STENOSIS/ LVF 50-55%  . Shoulder arthroscopy w/ subacromial decompression and distal clavicle excision  10-09-2008    AND DEBRIDEMENT OF RIGHT SHOULDER IMPINGEMENT & [facility] JOINT ARTHRITIS  . Right ankle arthroscopy w/ extensive debridement  04-05-2008   . Left ankle arthroscopy w/ debridement  05-12-2007  . Mohs surgery  2002    RIGHT ORBITAL CANCER  . Repair undesended right testicle / right inguinal hernia  AGE 54  . Septoplasty  NOV 2006  . Knee arthroscopy  05/01/2012    Procedure: ARTHROSCOPY KNEE;  Surgeon: Javier Docker, MD;  Location: [facility];  Service: Orthopedics;  Laterality: Left;  debridement and removal of loose body    reports that he has never smoked. He has never used smokeless tobacco. He reports that he does not drink alcohol or use illicit drugs. family history includes Depression in his daughter and sister; Drug abuse in his daughter. Allergies  Allergen Reactions  . Percocet [Oxycodone-Acetaminophen] Itching    Can take generic.  States only has a problem with percocet brand   Current Outpatient Prescriptions on File Prior to Visit  Medication Sig Dispense Refill  .  amLODipine (NORVASC) 5 MG tablet Take 1 tablet (5 mg total) by mouth daily. Take at hs  90 tablet  3  . amoxicillin-clavulanate (AUGMENTIN) 875-125 MG per tablet Take 1 tablet by mouth 2 (two) times daily.  20 tablet  0  . aspirin EC 81 MG tablet Take 1 tablet (81 mg total) by mouth daily.      . Coenzyme Q10 (CO Q 10 PO) Take by mouth daily.      [name] guaiFENesin (MUCINEX) 600 MG 12 hr tablet Take 600 mg by mouth 2 (two) times daily as needed. For congestion      . hydroxypropyl methylcellulose (ISOPTO TEARS) 2.5 % ophthalmic solution Place 1 drop into both eyes 4 (four) times daily as needed. For dry eyes      . Immune  Globulin, Human, (HIZENTRA) 4 GM/20ML SOLN Inject into the skin once a week. wednesday      . LamoTRIgine (LAMICTAL XR) 300 MG TB24 Take 1 tablet (300 mg total) by mouth daily.  90 tablet  0  . LORazepam (ATIVAN) 0.5 MG tablet TAKE 1 TABLET BY MOUTH TWICE A DAY AS NEEDED FOR ANXIETY  60 tablet  1  . naproxen (NAPROSYN) 500 MG tablet Take 500 mg by mouth 2 (two) times daily as needed. For pain      . OMEGA-3 KRILL OIL PO Take by mouth daily.      . RABEprazole (ACIPHEX) 20 MG tablet TAKE 1 TABLET BY MOUTH EVERY DAY  90 tablet  2  . SYNTHROID 150 MCG tablet TAKE 1 TABLET AT BEDTIME  90 tablet  0  . Tamsulosin HCl (FLOMAX) 0.4 MG CAPS Take 0.4 mg by mouth as needed.      [name] telmisartan (MICARDIS) 80 MG tablet Take 1 tablet (80 mg total) by mouth daily.  90 tablet  3  . terbinafine (LAMISIL) 250 MG tablet Take 250 mg by mouth every morning.       . Testosterone (AXIRON) 30 MG/ACT SOLN Place 2 Act onto the skin every morning.      . zolpidem (AMBIEN) 5 MG tablet TAKE 1 TABLET BY MOUTH AT BEDTIME AS NEEDED  30 tablet  1  . atorvastatin (LIPITOR) 40 MG tablet Take 40 mg by mouth at bedtime.       No current facility-administered medications on file prior to visit.   Review of Systems  Constitutional: Negative for unexpected weight change, or unusual diaphoresis  HENT: Negative for tinnitus.   Eyes: Negative for photophobia and visual disturbance.  Respiratory: Negative for choking and stridor.   Gastrointestinal: Negative for vomiting and blood in stool.  Genitourinary: Negative for hematuria and decreased urine volume.  Musculoskeletal: Negative for acute joint swelling Skin: Negative for color change and wound.  Neurological: Negative for tremors and numbness other than noted  Psychiatric/Behavioral: Negative for decreased concentration or  hyperactivity.       Objective:   Physical Exam BP 142/92  Pulse 80  Resp 16  Wt 179 lb (81.194 kg)  BMI 24.27 kg/m2 VS noted, mild to mod ill  appaering, diaphoretic Constitutional: Pt appears well-developed and well-nourished.  HENT: Head: NCAT.  Right Ear: External ear normal.  Left Ear: External ear normal.  Bilat tm's with slight erythema only.  Max sinus areas non tender.  Pharynx with minor erythema, no exudate Eyes: Conjunctivae and EOM are normal. Pupils are equal, round, and reactive to light.  Neck: Normal range of motion. Neck supple.  Cardiovascular: Normal rate and regular  rhythm.   Pulmonary/Chest: Effort normal and breath sounds normal.  - no rales or wheezing Neurological: Pt is alert. Not confused  Skin: Skin is warm. No erythema.  Psychiatric: Pt behavior is normal. Thought content normal. mild   CXR: RML pneumonia 8/15; a little worse today     I personally provided Anoro inhaler use teaching. After the teaching patient was able to demonstrate it's use effectively. All questions were answered  Assessment & Plan:

## 2013-03-31 NOTE — Assessment & Plan Note (Signed)
8/14 exacerbation Tart Anoro qd
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
## 2013-03-31 NOTE — Patient Instructions (Signed)
Call if problems 

## 2013-04-01 ENCOUNTER — Ambulatory Visit (INDEPENDENT_AMBULATORY_CARE_PROVIDER_SITE_OTHER): Payer: Commercial Indemnity | Admitting: Licensed Clinical Social Worker

## 2013-04-01 DIAGNOSIS — F3132 Bipolar disorder, current episode depressed, moderate: Secondary | ICD-10-CM

## 2013-04-01 LAB — CULTURE, BLOOD (SINGLE): Organism ID, Bacteria: NO GROWTH

## 2013-04-01 NOTE — Progress Notes (Signed)
   THERAPIST PROGRESS NOTE  Session Time: 3:00pm-3:50pm  Participation Level: Active  Behavioral Response: Well GroomedAlertAnxious and Depressed  Type of Therapy: Individual Therapy  Treatment Goals addressed: Coping  Interventions: CBT, DBT, Motivational Interviewing, Strength-based, Supportive and Reframing  Summary: Alex [name] is a 57 y.o. male who presents with depressed mood and anxious affect. He reports that he broke his arm and was recently diagnosed with pneumonia. He admits causing this himself by knowingly avoiding his injections to maintain his immune system. He continues to struggle with his family by avoiding conflict. He is fearful to do things which will result in conflict. He endorses high levels of anxiety related to his marriage which feels threatened. He continues to avoid follow through on goals set to improve his socialization outside the home. He is able to process his anxiety when confronted. His sleep is disrupted and his appetite is wnl.   Suicidal/Homicidal: Nowithout intent/plan  Therapist Response: Assessed patients current functioning and reviewed progress. Reviewed coping strategies. Assessed patients safety and assisted in identifying protective factors.  Reviewed crisis plan with patient. Assisted patient with the expression of depression. Reviewed patients self care plan. Assessed progress related to self care. Patients self care is poor. Recommend daily exercise, increased socialization and recreation. Used CBT to assist patient with the identification of negative distortions and irrational thoughts. Encouraged patient to verbalize alternative and factual responses which challenge thought distortions. Used DBT to practice mindfulness, review distraction list and improve distress tolerance skills. Reviewed healthy boundaries and assertive communication. Used motivational interviewing to assist and encourage patient through the change process. Explored  patients barriers to change.   Plan: Return again in two weeks.  Diagnosis: Axis I: Bipolar, Depressed    Axis II: No diagnosis    Blaire Hodsdon, LCSW 04/01/2013

## 2013-04-09 ENCOUNTER — Encounter: Payer: Self-pay | Admitting: Internal Medicine

## 2013-04-09 ENCOUNTER — Ambulatory Visit (INDEPENDENT_AMBULATORY_CARE_PROVIDER_SITE_OTHER): Payer: Commercial Indemnity | Admitting: Internal Medicine

## 2013-04-09 VITALS — BP 150/90 | HR 89 | Temp 98.1°F | Wt 181.0 lb

## 2013-04-09 DIAGNOSIS — D839 Common variable immunodeficiency, unspecified: Secondary | ICD-10-CM

## 2013-04-09 DIAGNOSIS — S81009A Unspecified open wound, unspecified knee, initial encounter: Secondary | ICD-10-CM

## 2013-04-09 DIAGNOSIS — Z23 Encounter for immunization: Secondary | ICD-10-CM

## 2013-04-09 DIAGNOSIS — S81831A Puncture wound without foreign body, right lower leg, initial encounter: Secondary | ICD-10-CM

## 2013-04-09 MED ORDER — SULFAMETHOXAZOLE-TRIMETHOPRIM 800-160 MG PO TABS: 1.0000 | ORAL_TABLET | Freq: Two times a day (BID) | ORAL | Status: DC

## 2013-04-09 NOTE — Patient Instructions (Addendum)
It was good to see you today. We have reviewed your prior records including labs and tests today Td updated today Debridement of skin in office today Septra antibiotics 2x/day x 1 week - Your prescription(s) have been submitted to your pharmacy. Please take as directed and contact our office if you believe you are having problem(s) with the medication(s). Wound care - rash with warm soapy water twice daily, keep covered with Band-Aid until scab. Call if increasing red redness, pain, swelling or other problems. Avoid occlusion with ointment as discussed  Puncture Wound A puncture wound is an injury that extends through all layers of the skin and into the tissue beneath the skin (subcutaneous tissue). Puncture wounds become infected easily because germs often enter the body and go beneath the skin during the injury. Having a deep wound with a small entrance point makes it difficult for your caregiver to adequately clean the wound. This is especially true if you have stepped on a nail and it has passed through a dirty shoe or other situations where the wound is obviously contaminated. CAUSES  Many puncture wounds involve glass, nails, splinters, fish hooks, or other objects that enter the skin (foreign bodies). A puncture wound may also be caused by a human bite or animal bite. DIAGNOSIS  A puncture wound is usually diagnosed by your history and a physical exam. You may need to have an X-ray or an ultrasound to check for any foreign bodies still in the wound. TREATMENT   Your caregiver will clean the wound as thoroughly as possible. Depending on the location of the wound, a bandage (dressing) may be applied.  Your caregiver might prescribe antibiotic medicines.  You may need a follow-up visit to check on your wound. Follow all instructions as directed by your caregiver. HOME CARE INSTRUCTIONS   Change your dressing once per day, or as directed by your caregiver. If the dressing sticks, it may be  removed by soaking the area in water.  If your caregiver has given you follow-up instructions, it is very important that you return for a follow-up appointment. Not following up as directed could result in a chronic or permanent injury, pain, and disability.  Only take over-the-counter or prescription medicines for pain, discomfort, or fever as directed by your caregiver.  If you are given antibiotics, take them as directed. Finish them even if you start to feel better. You may need a tetanus shot if:  You cannot remember when you had your last tetanus shot.  You have never had a tetanus shot. If you got a tetanus shot, your arm may swell, get red, and feel warm to the touch. This is common and not a problem. If you need a tetanus shot and you choose not to have one, there is a rare chance of getting tetanus. Sickness from tetanus can be serious. You may need a rabies shot if an animal bite caused your puncture wound. SEEK MEDICAL CARE IF:   You have redness, swelling, or increasing pain in the wound.  You have red streaks going away from the wound.  You notice a bad smell coming from the wound or dressing.  You have yellowish-white fluid (pus) coming from the wound.  You are treated with an antibiotic for infection, but the infection is not getting better.  You notice something in the wound, such as rubber from your shoe, cloth, or another object.  You have a fever.  You have severe pain.  You have difficulty breathing.  You feel dizzy or faint.  You cannot stop vomiting.  You lose feeling, develop numbness, or cannot move a limb below the wound.  Your symptoms worsen. MAKE SURE YOU:  Understand these instructions.  Will watch your condition.  Will get help right away if you are not doing well or get worse. Document Released: 05/08/2005 Document Revised: 10/21/2011 Document Reviewed: 01/15/2011 [facility] Patient Information 2014 [place], [place].

## 2013-04-09 NOTE — Progress Notes (Signed)
Subjective:    Patient ID: Alex [name], male    DOB: May 25, 1956, 57 y.o.   MRN: [id number]  Injury Incident onset: yesterdat 8/28 approx 6pm at [employer]. The injury mechanism was a cut/puncture wound. There is an injury to the right lower leg. The pain is mild. It is unlikely that a foreign body is present. There have been no prior injuries to these areas.    Past Medical History  Diagnosis Date  . Anxiety   . Hypertension   . Hyperlipidemia   . Immune deficiency disorder   . GERD (gastroesophageal reflux disease)   . Glaucoma BOTH EYES -- NO DROPS   . History of thyroid cancer PRIMARY (NO METS FROM ORBITAL CANCER)--   IN REMISSION    S/P TOTAL THYROIDECTOMY  , CHEMORADIATION  (ONCOLOGIST -- DR Arlice Colt)  . 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 Dysuria   . Prostatitis   . Epididymitis, left   . Renal calculi LEFT KIDNEY-- NON-OBSTRUCTIVE  . Depression   . Bipolar I disorder   . Complication of anesthesia POST URINARY RETENTION---  2006 SHOULDER SURGERY MARKED BRADYCARDIA VAGAL RESPONSE NO ISSUE W/ SURGERY AFTER THIS ONE  . BPH (benign prostatic hypertrophy) with urinary obstruction   . Positional vertigo   . Left flank pain   . Coronary atherosclerosis CARDIOLOGIST- DR CRENSHAW--  LAST VISIT 01-05-2012 IN [facility]    NON-OBSTRUCTIVE MILD DISEASE  . Urinary hesitancy   . Left knee pain CHONDRAL FLAP TEAR    Review of Systems  Constitutional: Negative for fever.  Musculoskeletal: Negative for myalgias, joint swelling and gait problem.  Skin: Positive for wound. Negative for rash.       Objective:   Physical Exam BP 150/90  Pulse 89  Temp(Src) 98.1 F (36.7 C) (Oral)  Wt 181 lb (82.101 kg)  BMI 24.54 kg/m2  SpO2 98% Wt Readings from Last 3 Encounters:  04/09/13 181 lb (82.101 kg)   03/31/13 179 lb (81.194 kg)  03/31/13 179 lb (81.194 kg)   Constitutional: he appears well-developed and well-nourished. No distress.  Cardiovascular: Normal rate, regular rhythm and normal heart sounds.  No murmur heard. No BLE edema. Pulmonary/Chest: Effort normal and breath sounds normal. No respiratory distress. he has no wheezes.  MSkel: RLE without soft tissue swelling - ligamentous function intact. NV intact distally Skin:  Anterior right shin with 7mm x 5mm puncture wound, 3mm deep -no skin tear but "flaps" at edges (removed today). No drainage, odor, purulence or surrounding erythema. Skin is warm and dry. No rash noted.  Psychiatric: he has a normal mood and affect. behavior is normal. Judgment and thought content normal.  Lab Results  Component Value Date   WBC 13.4* 03/26/2013   HGB 14.2 03/26/2013   HCT 41.7 03/26/2013   PLT 247.0 03/26/2013   GLUCOSE 150* 03/26/2013   CHOL 162 07/27/2012   TRIG 50.0 07/27/2012   HDL 74.40 07/27/2012   LDLDIRECT 134.7 03/05/2007   LDLCALC 78 07/27/2012   ALT 30 03/26/2013   AST 26 03/26/2013   NA 131* 03/26/2013   K 3.9 03/26/2013   CL 99 03/26/2013   CREATININE 1.0 03/26/2013   BUN 13 03/26/2013   CO2 24 03/26/2013   TSH 6.76* 08/04/2012  PSA 0.82 06/22/2010   HGBA1C 6.1 08/04/2012        Assessment & Plan:   Puncture wound -  Right shin S/p debridement of skin edge Td update today Empiric septra x 1 week because of immunocompromised state (common variable immunodefic) Wound care advised

## 2013-04-14 ENCOUNTER — Ambulatory Visit (HOSPITAL_COMMUNITY): Payer: Self-pay | Admitting: Licensed Clinical Social Worker

## 2013-04-28 ENCOUNTER — Ambulatory Visit (INDEPENDENT_AMBULATORY_CARE_PROVIDER_SITE_OTHER): Payer: Commercial Indemnity | Admitting: Licensed Clinical Social Worker

## 2013-04-28 DIAGNOSIS — F3132 Bipolar disorder, current episode depressed, moderate: Secondary | ICD-10-CM

## 2013-04-28 NOTE — Progress Notes (Signed)
   THERAPIST PROGRESS NOTE  Session Time: 2:00pm-2:50pm  Participation Level: Active  Behavioral Response: Well GroomedAlertAnxious, Depressed and Irritable  Type of Therapy: Individual Therapy  Treatment Goals addressed: Coping  Interventions: CBT, DBT, Strength-based, Assertiveness Training, Supportive and Reframing  Summary: Alex [name] is a 57 y.o. male who presents with depressed mood and anxious affect. He reports ongoing stress related to his family. He reports that he is not giving himself the injections he needs to support his immune system. When asked why, he states that he doesn't know. When pressed, he reports that he wants his wife to encourage him to do this and that he is "fishing" for compassion. He continues to enable his daughters addiction. He remains fearful of confrontation. He did not follow through on finding a group at church.    Suicidal/Homicidal: Nowithout intent/plan  Therapist Response: Assessed patients current functioning and reviewed progress. Reviewed coping strategies. Assessed patients safety and assisted in identifying protective factors.  Reviewed crisis plan with patient. Assisted patient with the expression of frustration. Reviewed patients self care plan. Assessed progress related to self care. Patients self care is good. Recommend daily exercise, increased socialization and recreation. Used CBT to assist patient with the identification of negative distortions and irrational thoughts. Encouraged patient to verbalize alternative and factual responses which challenge thought distortions. Used DBT to practice mindfulness, review distraction list and improve distress tolerance skills. Reviewed healthy boundaries and assertive communication. Used motivational interviewing to assist and encourage patient through the change process. Explored patients barriers to change.   Plan: Return again in two weeks.  Diagnosis: Axis I: Bipolar, Depressed    Axis II:  No diagnosis    Giabella Duhart, LCSW 04/28/2013

## 2013-05-12 ENCOUNTER — Ambulatory Visit (INDEPENDENT_AMBULATORY_CARE_PROVIDER_SITE_OTHER): Payer: Commercial Indemnity | Admitting: Licensed Clinical Social Worker

## 2013-05-12 ENCOUNTER — Other Ambulatory Visit: Payer: Self-pay | Admitting: Orthopedic Surgery

## 2013-05-12 DIAGNOSIS — F3132 Bipolar disorder, current episode depressed, moderate: Secondary | ICD-10-CM

## 2013-05-12 DIAGNOSIS — M545 Low back pain, unspecified: Secondary | ICD-10-CM

## 2013-05-12 NOTE — Progress Notes (Signed)
   THERAPIST PROGRESS NOTE  Session Time: 11:30am-12:20pm  Participation Level: Active  Behavioral Response: Well GroomedAlertAnxious, Depressed and Irritable  Type of Therapy: Individual Therapy  Treatment Goals addressed: Coping  Interventions: CBT, DBT, Motivational Interviewing, Strength-based, Supportive and Reframing  Summary: Alex [name] is a 57 y.o. male who presents with anxious mood and congruent affect. He reports ongoing stress related to his health, his wife and his children. He describes having an MRI of his back with the discovery of a mass and more degenerative problems. He has given himself one infusion treatment, but has not gotten back on schedule. He remains evasive about his avoidance of taking care of himself in a responsible manner. He continues to look for compassion and understanding from his wife but does not receive it. He attempts to elicit this from his wife without success. He is upset with his daughter's addiction and question what he can do other than kick her out of the home. Discussed enabling behavior and the negative consequences associated with this.    Suicidal/Homicidal: Nowithout intent/plan  Therapist Response: Assessed patients current functioning and reviewed progress. Reviewed coping strategies. Assessed patients safety and assisted in identifying protective factors.  Reviewed crisis plan with patient. Assisted patient with the expression of frustration with his wife. Reviewed patients self care plan. Assessed progress related to self care. Patients self care is fair. Recommend daily exercise, increased socialization and recreation. Used CBT to assist patient with the identification of negative distortions and irrational thoughts. Encouraged patient to verbalize alternative and factual responses which challenge thought distortions. Used DBT to practice mindfulness, review distraction list and improve distress tolerance skills. Reviewed healthy  boundaries and assertive communication.   Plan: Return again in two weeks.  Diagnosis: Axis I: Bipolar, Depressed    Axis II: No diagnosis    Hilarie Sinha, LCSW 05/12/2013

## 2013-05-20 ENCOUNTER — Ambulatory Visit
Admission: RE | Admit: 2013-05-20 | Discharge: 2013-05-20 | Disposition: A | Discharge status: Home / Self Care | Payer: Managed Care, Other (non HMO) | Source: Ambulatory Visit | Attending: Orthopedic Surgery | Admitting: Orthopedic Surgery

## 2013-05-20 DIAGNOSIS — M545 Low back pain, unspecified: Secondary | ICD-10-CM

## 2013-05-27 ENCOUNTER — Other Ambulatory Visit (INDEPENDENT_AMBULATORY_CARE_PROVIDER_SITE_OTHER): Payer: Managed Care, Other (non HMO)

## 2013-05-27 DIAGNOSIS — Z0000 Encounter for general adult medical examination without abnormal findings: Secondary | ICD-10-CM

## 2013-05-27 LAB — COMPREHENSIVE METABOLIC PANEL
ALT: 40 U/L (ref 0–53)
AST: 30 U/L (ref 0–37)
Albumin: 4.3 g/dL (ref 3.5–5.2)
Alkaline Phosphatase: 60 U/L (ref 39–117)
BUN: 22 mg/dL (ref 6–23)
CO2: 27 mEq/L (ref 19–32)
Calcium: 8.9 mg/dL (ref 8.4–10.5)
Chloride: 103 mEq/L (ref 96–112)
Creatinine, Ser: 1 mg/dL (ref 0.4–1.5)
GFR: 79.32 mL/min (ref >60.00)
Glucose, Bld: 97 mg/dL (ref 70–99)
Potassium: 4.2 mEq/L (ref 3.5–5.1)
Sodium: 140 mEq/L (ref 135–145)
Total Bilirubin: 0.9 mg/dL (ref 0.3–1.2)
Total Protein: 6.5 g/dL (ref 6.0–8.3)

## 2013-05-27 LAB — CBC WITH DIFFERENTIAL/PLATELET
Basophils Absolute: 0 10*3/uL (ref 0.0–0.1)
Basophils Relative: 0.6 % (ref 0.0–3.0)
Eosinophils Absolute: 0.2 10*3/uL (ref 0.0–0.7)
Eosinophils Relative: 2.7 % (ref 0.0–5.0)
HCT: 45.7 % (ref 39.0–52.0)
Hemoglobin: 15.3 g/dL (ref 13.0–17.0)
Lymphocytes Relative: 18.4 % (ref 12.0–46.0)
Lymphs Abs: 1.3 10*3/uL (ref 0.7–4.0)
MCHC: 33.5 g/dL (ref 30.0–36.0)
MCV: 81.4 fl (ref 78.0–100.0)
Monocytes Absolute: 0.7 10*3/uL (ref 0.1–1.0)
Monocytes Relative: 10.5 % (ref 3.0–12.0)
Neutro Abs: 4.8 10*3/uL (ref 1.4–7.7)
Neutrophils Relative %: 67.8 % (ref 43.0–77.0)
Platelets: 235 10*3/uL (ref 150.0–400.0)
RBC: 5.61 Mil/uL (ref 4.22–5.81)
RDW: 15.8 % — ABNORMAL HIGH (ref 11.5–14.6)
WBC: 7.1 10*3/uL (ref 4.5–10.5)

## 2013-05-27 LAB — URINALYSIS, ROUTINE W REFLEX MICROSCOPIC
Ketones, ur: NEGATIVE
Leukocytes, UA: NEGATIVE
Nitrite: NEGATIVE
Specific Gravity, Urine: >=1.03 (ref 1.000–1.030)
Total Protein, Urine: NEGATIVE
Urine Glucose: NEGATIVE
Urobilinogen, UA: 0.2 (ref 0.0–1.0)
pH: 5.5 (ref 5.0–8.0)

## 2013-05-27 LAB — LIPID PANEL
Cholesterol: 179 mg/dL (ref 0–200)
HDL: 62.9 mg/dL (ref >39.00)
LDL Cholesterol: 94 mg/dL (ref 0–99)
Total CHOL/HDL Ratio: 3
Triglycerides: 109 mg/dL (ref 0.0–149.0)
VLDL: 21.8 mg/dL (ref 0.0–40.0)

## 2013-05-27 LAB — TSH: TSH: 2.05 u[IU]/mL (ref 0.35–5.50)

## 2013-05-27 LAB — PSA: PSA: 1.59 ng/mL (ref 0.10–4.00)

## 2013-05-31 ENCOUNTER — Ambulatory Visit (HOSPITAL_COMMUNITY): Payer: Commercial Indemnity | Admitting: Psychiatry

## 2013-05-31 ENCOUNTER — Encounter: Payer: Self-pay | Admitting: Internal Medicine

## 2013-05-31 ENCOUNTER — Other Ambulatory Visit: Payer: Managed Care, Other (non HMO)

## 2013-05-31 ENCOUNTER — Encounter (HOSPITAL_COMMUNITY): Payer: Self-pay | Admitting: Psychiatry

## 2013-05-31 ENCOUNTER — Ambulatory Visit (HOSPITAL_COMMUNITY): Payer: Self-pay | Admitting: Psychiatry

## 2013-05-31 ENCOUNTER — Ambulatory Visit (INDEPENDENT_AMBULATORY_CARE_PROVIDER_SITE_OTHER): Payer: Commercial Indemnity | Admitting: Internal Medicine

## 2013-05-31 VITALS — BP 116/80 | HR 80 | Temp 97.8°F | Resp 16 | Ht 72.05 in | Wt 179.0 lb

## 2013-05-31 VITALS — BP 111/85 | HR 100 | Ht 72.0 in | Wt 179.0 lb

## 2013-05-31 DIAGNOSIS — F411 Generalized anxiety disorder: Secondary | ICD-10-CM

## 2013-05-31 DIAGNOSIS — F329 Major depressive disorder, single episode, unspecified: Secondary | ICD-10-CM

## 2013-05-31 DIAGNOSIS — H409 Unspecified glaucoma: Secondary | ICD-10-CM | POA: Insufficient documentation

## 2013-05-31 DIAGNOSIS — E291 Testicular hypofunction: Secondary | ICD-10-CM

## 2013-05-31 DIAGNOSIS — J189 Pneumonia, unspecified organism: Secondary | ICD-10-CM

## 2013-05-31 DIAGNOSIS — F319 Bipolar disorder, unspecified: Secondary | ICD-10-CM

## 2013-05-31 DIAGNOSIS — Z23 Encounter for immunization: Secondary | ICD-10-CM

## 2013-05-31 DIAGNOSIS — M545 Low back pain, unspecified: Secondary | ICD-10-CM

## 2013-05-31 DIAGNOSIS — F3289 Other specified depressive episodes: Secondary | ICD-10-CM

## 2013-05-31 DIAGNOSIS — F39 Unspecified mood [affective] disorder: Secondary | ICD-10-CM

## 2013-05-31 DIAGNOSIS — I10 Essential (primary) hypertension: Secondary | ICD-10-CM

## 2013-05-31 DIAGNOSIS — E785 Hyperlipidemia, unspecified: Secondary | ICD-10-CM

## 2013-05-31 DIAGNOSIS — Z0000 Encounter for general adult medical examination without abnormal findings: Secondary | ICD-10-CM | POA: Insufficient documentation

## 2013-05-31 MED ORDER — VITAMIN D 1000 UNITS PO TABS: 1000.0000 [IU] | ORAL_TABLET | Freq: Every day | ORAL | Status: AC

## 2013-05-31 MED ORDER — ZOLPIDEM TARTRATE 5 MG PO TABS: ORAL_TABLET | ORAL | Status: DC

## 2013-05-31 MED ORDER — GABAPENTIN 100 MG PO CAPS: 100.0000 mg | ORAL_CAPSULE | Freq: Every evening | ORAL | Status: DC | PRN

## 2013-05-31 MED ORDER — LORAZEPAM 0.5 MG PO TABS: ORAL_TABLET | ORAL | Status: DC

## 2013-05-31 MED ORDER — LAMOTRIGINE ER 300 MG PO TB24: 300.0000 mg | ORAL_TABLET | Freq: Every day | ORAL | Status: DC

## 2013-05-31 NOTE — Progress Notes (Signed)
[facility] [address] Progress Note  Alex [name] [id number] 57 y.o.  05/31/2013 4:05 PM  Chief Complaint:  Medication management and followup.      History of Present Illness:  Patient is 57 year old Caucasian married man who came for his followup appointment.  Patient complained of increased stress and anxiety do to his family situation.  [facility] to [facility] himself.  He is releasing today.  Her daughter moved in home .  Patient endorses increased anxiety and nervousness.  Recently he has seen his primary care physician who recommended to try Neurontin for anxiety, back pain and nervousness.  He is compliant with his Lamictal and Ambien.  He is to take Ativan twice a day.  He seeing therapist.  Patient denies any hallucination or any paranoia.  He denies any suicidal thoughts or homicidal thoughts.  He has no rash or itching.  He sleeps on and off.  He admitted to racing thoughts however denies any feeling of hopelessness or helplessness.  He is not drinking or using any illegal substance.  Patient is concerned about his chronic back pain and hoping that Neurontin will help his pain and anxiety.  Patient has blood work 4 days ago.  Suicidal Ideation: No Plan Formed: No Patient has means to carry out plan: No  Homicidal Ideation: No Plan Formed: No Patient has means to carry out plan: No  Review of Systems: Psychiatric: Agitation: No Hallucination: No Depressed Mood: No Insomnia: Yes Hypersomnia: No Altered Concentration: No Feels Worthless: No Grandiose Ideas: No Belief In Special Powers: No New/Increased Substance Abuse: No Compulsions: No  Neurologic: Headache: Yes Seizure: No Paresthesias: No  Past  psychiatric history Patient has been seeing in this office since 2009.  He has history of depression mood swing and anger.  [facility] suicidal thoughts but he denies any  suicidal attempt.  He denies any psychosis but admitted poor impulse control.  In the past he has taken Seroquel and Cymbalta.    Medical  history Patient has history of hypertension , immune deficiency syndrome , GERD , Tick disorder, coronary artery disease, hypertension, COPD, esophagus stricture, lumbago, osteoarthritis, malignant neoplasm of orbit, vertigo, Effexor, osteoarthritis and chronic fatigue.  Family history Patient's sister has alcohol issues and depression.  One sister was admitted in patient in [place].    Social History:  lives with her daughter and wife.  His another daughter lives in [place] area.    Outpatient Encounter Prescriptions as of 05/31/2013  Medication Sig Dispense Refill  . amLODipine (NORVASC) 5 MG tablet Take 1 tablet (5 mg total) by mouth daily. Take at hs  90 tablet  3  . aspirin EC 81 MG tablet Take 1 tablet (81 mg total) by mouth daily.      . cholecalciferol (VITAMIN D) 1000 UNITS tablet Take 1 tablet (1,000 Units total) by mouth daily.  100 tablet  3  . Coenzyme Q10 (CO Q 10 PO) Take by mouth daily.      [name] gabapentin (NEURONTIN) 100 MG capsule Take 1-2 capsules (100-200 mg total) by mouth at bedtime as needed.  90 capsule  1  . guaiFENesin (MUCINEX) 600 MG 12 hr tablet Take 600 mg by mouth 2 (two) times daily as needed. For congestion      . hydroxypropyl methylcellulose (ISOPTO TEARS) 2.5 % ophthalmic solution Place 1 drop into both eyes 4 (four) times daily as  needed. For dry eyes      . Immune Globulin, Human, (HIZENTRA) 4 GM/20ML SOLN Inject into the skin once a week. wednesday      . LamoTRIgine (LAMICTAL XR) 300 MG TB24 Take 1 tablet (300 mg total) by mouth daily.  90 tablet  0  . LORazepam (ATIVAN) 0.5 MG tablet TAKE 1 TABLET BY MOUTH TWICE A DAY AS NEEDED FOR ANXIETY  60 tablet  1  . naproxen (NAPROSYN) 500 MG tablet Take 500 mg by mouth 2 (two) times daily as needed. For pain      . OMEGA-3 KRILL OIL PO Take by mouth daily.       . RABEprazole (ACIPHEX) 20 MG tablet TAKE 1 TABLET BY MOUTH EVERY DAY  90 tablet  2  . SYNTHROID 150 MCG tablet TAKE 1 TABLET AT BEDTIME  90 tablet  0  . Tamsulosin HCl (FLOMAX) 0.4 MG CAPS Take 0.4 mg by mouth as needed.      [name] telmisartan (MICARDIS) 80 MG tablet Take 1 tablet (80 mg total) by mouth daily.  90 tablet  3  . Testosterone (AXIRON) 30 MG/ACT SOLN Place 2 Act onto the skin every morning.      [name] Umeclidinium-Vilanterol (ANORO ELLIPTA) 62.5-25 MCG/INH AEPB Inhale 1 Act into the lungs daily.  1 each  11  . zolpidem (AMBIEN) 5 MG tablet TAKE 1 TABLET BY MOUTH AT BEDTIME AS NEEDED  30 tablet  1  . [DISCONTINUED] LamoTRIgine (LAMICTAL XR) 300 MG TB24 Take 1 tablet (300 mg total) by mouth daily.  90 tablet  0  . [DISCONTINUED] LORazepam (ATIVAN) 0.5 MG tablet TAKE 1 TABLET BY MOUTH TWICE A DAY AS NEEDED FOR ANXIETY  60 tablet  1  . [DISCONTINUED] zolpidem (AMBIEN) 5 MG tablet TAKE 1 TABLET BY MOUTH AT BEDTIME AS NEEDED  30 tablet  1  . atorvastatin (LIPITOR) 40 MG tablet Take 40 mg by mouth at bedtime.       No facility-administered encounter medications on file as of 05/31/2013.    Past Psychiatric History/Hospitalization(s): Anxiety: Yes Bipolar Disorder: Yes Depression: Yes Mania: No Psychosis: No Schizophrenia: No Personality Disorder: No Hospitalization for psychiatric illness: Yes History of Electroconvulsive Shock Therapy: No Prior Suicide Attempts: No  Physical Exam: Constitutional:  BP 111/85  Pulse 100  Ht 6' (1.829 m)  Wt 179 lb (81.194 kg)  BMI 24.27 kg/m2  General Appearance: alert, oriented, no acute distress  Musculoskeletal: Strength & Muscle Tone: within normal limits Gait & Station: normal Patient leans: N/A  Review of Systems  Constitutional: Positive for malaise/fatigue.  Musculoskeletal: Positive for back pain.  Neurological:       Restless  Psychiatric/Behavioral: The patient is nervous/anxious.    Recent Results (from the past 2160  hour(s))  CULTURE, BLOOD (SINGLE)     Status: None   Collection Time    03/26/13  3:13 PM      Result Value Range   Organism ID, Bacteria NO GROWTH 5 DAYS    BASIC METABOLIC PANEL     Status: Abnormal   Collection Time    03/26/13  3:13 PM      Result Value Range   Sodium 131 (*) 135 - 145 mEq/L   Potassium 3.9  3.5 - 5.1 mEq/L   Chloride 99  96 - 112 mEq/L   CO2 24  19 - 32 mEq/L   Glucose, Bld 150 (*) 70 - 99 mg/dL   BUN 13  6 - 23 mg/dL  Creatinine, Ser 1.0  0.4 - 1.5 mg/dL   Calcium 8.2 (*) 8.4 - 10.5 mg/dL   GFR 16.10  >96.04 mL/min  HEPATIC FUNCTION PANEL     Status: None   Collection Time    03/26/13  3:13 PM      Result Value Range   Total Bilirubin 1.0  0.3 - 1.2 mg/dL   Bilirubin, Direct 0.2  0.0 - 0.3 mg/dL   Alkaline Phosphatase 65  39 - 117 U/L   AST 26  0 - 37 U/L   ALT 30  0 - 53 U/L   Total Protein 6.5  6.0 - 8.3 g/dL   Albumin 3.6  3.5 - 5.2 g/dL  CBC WITH DIFFERENTIAL     Status: Abnormal   Collection Time    03/26/13  3:13 PM      Result Value Range   WBC 13.4 (*) 4.5 - 10.5 K/uL   RBC 5.06  4.22 - 5.81 Mil/uL   Hemoglobin 14.2  13.0 - 17.0 g/dL   HCT 54.0  98.1 - 19.1 %   MCV 82.4  78.0 - 100.0 fl   MCHC 34.0  30.0 - 36.0 g/dL   RDW 47.8 (*) 29.5 - 62.1 %   Platelets 247.0  150.0 - 400.0 K/uL   Neutrophils Relative % 82.3 (*) 43.0 - 77.0 %   Lymphocytes Relative 6.1 (*) 12.0 - 46.0 %   Monocytes Relative 11.1  3.0 - 12.0 %   Eosinophils Relative 0.3  0.0 - 5.0 %   Basophils Relative 0.2  0.0 - 3.0 %   Neutro Abs 11.0 (*) 1.4 - 7.7 K/uL   Lymphs Abs 0.8  0.7 - 4.0 K/uL   Monocytes Absolute 1.5 (*) 0.1 - 1.0 K/uL   Eosinophils Absolute 0.0  0.0 - 0.7 K/uL   Basophils Absolute 0.0  0.0 - 0.1 K/uL  URINALYSIS, ROUTINE W REFLEX MICROSCOPIC     Status: None   Collection Time    03/26/13  3:13 PM      Result Value Range   Color, Urine YELLOW  Yellow;Lt. Yellow   APPearance CLEAR  Clear   Specific Gravity, Urine 1.020  1.000-1.030   pH 6.0  5.0  - 8.0   Total Protein, Urine NEGATIVE  Negative   Urine Glucose NEGATIVE  Negative   Ketones, ur NEGATIVE  Negative   Bilirubin Urine NEGATIVE  Negative   Hgb urine dipstick TRACE-INTACT  Negative   Urobilinogen, UA 0.2  0.0 - 1.0   Leukocytes, UA TRACE  Negative   Nitrite NEGATIVE  Negative   WBC, UA 0-2/hpf  0-2/hpf   RBC / HPF 0-2/hpf  0-2/hpf   Mucus, UA Presence of  None   Squamous Epithelial / LPF Rare(0-4/hpf)  Rare(0-4/hpf)  CBC WITH DIFFERENTIAL     Status: Abnormal   Collection Time    05/27/13 12:06 PM      Result Value Range   WBC 7.1  4.5 - 10.5 K/uL   RBC 5.61  4.22 - 5.81 Mil/uL   Hemoglobin 15.3  13.0 - 17.0 g/dL   HCT 30.8  65.7 - 84.6 %   MCV 81.4  78.0 - 100.0 fl   MCHC 33.5  30.0 - 36.0 g/dL   RDW 96.2 (*) 95.2 - 84.1 %   Platelets 235.0  150.0 - 400.0 K/uL   Neutrophils Relative % 67.8  43.0 - 77.0 %   Lymphocytes Relative 18.4  12.0 - 46.0 %   Monocytes Relative  10.5  3.0 - 12.0 %   Eosinophils Relative 2.7  0.0 - 5.0 %   Basophils Relative 0.6  0.0 - 3.0 %   Neutro Abs 4.8  1.4 - 7.7 K/uL   Lymphs Abs 1.3  0.7 - 4.0 K/uL   Monocytes Absolute 0.7  0.1 - 1.0 K/uL   Eosinophils Absolute 0.2  0.0 - 0.7 K/uL   Basophils Absolute 0.0  0.0 - 0.1 K/uL  COMPREHENSIVE METABOLIC PANEL     Status: None   Collection Time    05/27/13 12:06 PM      Result Value Range   Sodium 140  135 - 145 mEq/L   Potassium 4.2  3.5 - 5.1 mEq/L   Chloride 103  96 - 112 mEq/L   CO2 27  19 - 32 mEq/L   Glucose, Bld 97  70 - 99 mg/dL   BUN 22  6 - 23 mg/dL   Creatinine, Ser 1.0  0.4 - 1.5 mg/dL   Total Bilirubin 0.9  0.3 - 1.2 mg/dL   Alkaline Phosphatase 60  39 - 117 U/L   AST 30  0 - 37 U/L   ALT 40  0 - 53 U/L   Total Protein 6.5  6.0 - 8.3 g/dL   Albumin 4.3  3.5 - 5.2 g/dL   Calcium 8.9  8.4 - 16.1 mg/dL   GFR 09.60  >45.40 mL/min  LIPID PANEL     Status: None   Collection Time    05/27/13 12:06 PM      Result Value Range   Cholesterol 179  0 - 200 mg/dL    Comment: ATP III Classification       Desirable:  < 200 mg/dL               Borderline High:  200 - 239 mg/dL          High:  > = 981 mg/dL   Triglycerides 191.4  0.0 - 149.0 mg/dL   Comment: Normal:  <782 mg/dLBorderline High:  150 - 199 mg/dL   HDL 95.62  >13.08 mg/dL   VLDL 65.7  0.0 - 84.6 mg/dL   LDL Cholesterol 94  0 - 99 mg/dL   Total CHOL/HDL Ratio 3     Comment:                Men          Women1/2 Average Risk     3.4          3.3Average Risk          5.0          4.42X Average Risk          9.6          7.13X Average Risk          15.0          11.0                      TSH     Status: None   Collection Time    05/27/13 12:06 PM      Result Value Range   TSH 2.05  0.35 - 5.50 uIU/mL  URINALYSIS, ROUTINE W REFLEX MICROSCOPIC     Status: None   Collection Time    05/27/13 12:06 PM      Result Value Range   Color, Urine YELLOW  Yellow;Lt. Yellow   APPearance CLEAR  Clear  Specific Gravity, Urine >=1.030  1.000 - 1.030   pH 5.5  5.0 - 8.0   Total Protein, Urine NEGATIVE  Negative   Urine Glucose NEGATIVE  Negative   Ketones, ur NEGATIVE  Negative   Bilirubin Urine SMALL  Negative   Hgb urine dipstick TRACE-LYSED  Negative   Urobilinogen, UA 0.2  0.0 - 1.0   Leukocytes, UA NEGATIVE  Negative   Nitrite NEGATIVE  Negative   WBC, UA 0-2/hpf  0-2/hpf   RBC / HPF 3-6/hpf  0-2/hpf   Mucus, UA Presence of  None   Squamous Epithelial / LPF Rare(0-4/hpf)  Rare(0-4/hpf)  PSA     Status: None   Collection Time    05/27/13 12:06 PM      Result Value Range   PSA 1.59  0.10 - 4.00 ng/mL   Mental status examination Patient is casually dressed and fairly groomed.  He is anxious but maintained a good eye contact.  He described his mood is nervous and his affect is mood appropriate.  He denies any active or passive suicidal thoughts or homicidal thoughts.  He denies any auditory or visual hallucination.  He is restless but there were no tremors or shakes present.  His psychomotor  activity is slightly increased.  His thought process is logical linear and goal-directed.  There were no flight of ideas present at this time.  His fund of knowledge is adequate.  His attention and concentration is okay.  He's alert and oriented x3.  His insight judgment and impulse control is okay.   Medical Decision Making (Choose Three): Established Problem, Stable/Improving (1), Review of Psycho-Social Stressors (1), Review or order clinical lab tests (1), Review of Last Therapy Session (1), Review of Medication Regimen & Side Effects (2) and Review of New Medication or Change in Dosage (2)  Assessment: Axis I:  bipolar disorder1  Axis II:  deferred  Axis III:  see medical history   Patient Active Problem List   Diagnosis Date Noted  . Well adult exam 05/31/2013  . Glaucoma 05/31/2013  . RML pneumonia 03/31/2013  . Fever, unspecified 03/26/2013  . Hypertension, uncontrolled 02/25/2013  . Preop exam for internal medicine 04/02/2012  . Ingrowing toenail with infection 04/02/2012  . Localized osteoarthritis of left knee 04/02/2012  . Vertigo 01/05/2012  . Ataxia 01/05/2012  . Bipolar 1 disorder, depressed, moderate 10/31/2011  . ANXIETY 08/08/2010  . COPD 11/30/2009  . ESOPHAGEAL STRICTURE 11/30/2009  . CHANGE IN BOWELS 11/30/2009  . DEPRESSION 08/07/2009  . OSTEOARTHRITIS 08/07/2009  . GENERALIZED OSTEOARTHROSIS UNSPECIFIED SITE 10/20/2008  . HYPERLIPIDEMIA-MIXED 10/19/2008  . FOOT PAIN 07/11/2008  . EPIDYDIMITIS/ORCHITIS 05/24/2008  . FATIGUE 05/14/2008  . RASH-NONVESICULAR 05/14/2008  . Cough 03/29/2008  . CORONARY ATHEROSCLEROSIS 11/04/2007  . Lumbago 11/04/2007  . THYROIDECTOMY, HX OF 11/04/2007  . 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 NEOPLASM, MALIGNANT, THYROID GLAND 10/02/2007  . HYPOGONADISM, MALE 10/02/2007  . 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 IMPAIRED GLUCOSE TOLERANCE 10/02/2007    Axis IV: Mild to moderate  Axis V: 55-65  Plan:  I  reviewed his blood work, current medications and notes from his primary care physician.  The patient has a lot of medical issues, psychosocial issues and he is taking multiple medications.  His Neurontin was given by his primary care physician .  I recommend to wait before we do any adjustment in his medication since Neurontin can help his anxiety and nervousness.  Recommend to see therapist for coping and social  skills.  I will continue Lamictal , Ambien and Ativan at present dose.  I discuss the risks and benefits of medication.  Recommend to call us back if he has any questions or any concern.  Followup in 3 months. Time spent 25 minutes.  More than 50% of the time spent in psychoeducation, counseling and coordination of care.  Discuss safety plan that anytime having active suicidal thoughts or homicidal thoughts then patient need to call 911 or go to the local emergency room.   ARFEEN,SYED T., MD 05/31/2013

## 2013-05-31 NOTE — Assessment & Plan Note (Signed)
Per Dr Retta Diones

## 2013-05-31 NOTE — Assessment & Plan Note (Signed)
We discussed age appropriate health related issues, including available/recomended screening tests and vaccinations. We discussed a need for adhering to healthy diet and exercise. Labs/EKG were reviewed/ordered. All questions were answered.   

## 2013-05-31 NOTE — Assessment & Plan Note (Signed)
BP Readings from Last 3 Encounters:  05/31/13 116/80  04/09/13 150/90  03/31/13 142/92

## 2013-05-31 NOTE — Addendum Note (Signed)
Addended by: Merrilyn Puma on: 05/31/2013 03:06 PM   Modules accepted: Orders

## 2013-05-31 NOTE — Progress Notes (Signed)
Subjective:     The patient is here for a wellness exam. The patient has been doing well overall without new major physical or psychological issues going on lately. C/o stress  Wt Readings from Last 3 Encounters:  05/31/13 179 lb (81.194 kg)  04/09/13 181 lb (82.101 kg)  03/31/13 179 lb (81.194 kg)   BP Readings from Last 3 Encounters:  05/31/13 116/80  04/09/13 150/90  03/31/13 142/92    Hypertension This is a chronic problem. The current episode started more than 1 year ago. The problem has been rapidly worsening (stressed a lot lately) since onset. Pertinent negatives include no chest pain, neck pain or palpitations.    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 Alex [name] tried to overdose himself several times 6/14, 10/14  One dtr has moved in - stress  Stressed out.  BP Readings from Last 3 Encounters:  05/31/13 116/80  04/09/13 150/90  03/31/13 142/92   Wt Readings from Last 3 Encounters:  05/31/13 179 lb (81.194 kg)  04/09/13 181 lb (82.101 kg)  03/31/13 179 lb (81.194 kg)   BP 116/80  Pulse 80  Temp(Src) 97.8 F (36.6 C) (Oral)  Resp 16  Ht 6' 0.05" (1.83 m)  Wt 179 lb (81.194 kg)  BMI 24.24 kg/m2  Past Medical History  Diagnosis Date  . Anxiety   . Hypertension   . Hyperlipidemia   . Immune deficiency disorder   . GERD (gastroesophageal reflux disease)   . Glaucoma BOTH EYES -- NO DROPS   . History of thyroid cancer PRIMARY (NO METS FROM ORBITAL CANCER)--   IN REMISSION    S/P TOTAL THYROIDECTOMY  , CHEMORADIATION  (ONCOLOGIST -- DR Arlice Colt)  . 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 Dysuria   . Prostatitis   . Epididymitis, left   . Renal calculi LEFT KIDNEY-- NON-OBSTRUCTIVE  . Depression   . Bipolar I disorder   . Complication of anesthesia POST URINARY RETENTION---  2006 SHOULDER SURGERY MARKED BRADYCARDIA VAGAL RESPONSE NO ISSUE W/ SURGERY AFTER THIS ONE  . BPH (benign prostatic hypertrophy) with urinary obstruction   . Positional vertigo   . Left flank pain   . Coronary atherosclerosis CARDIOLOGIST- DR CRENSHAW--  LAST VISIT 01-05-2012 IN [facility]    NON-OBSTRUCTIVE MILD DISEASE  . Urinary hesitancy   . Left knee pain CHONDRAL FLAP TEAR   Past Surgical History  Procedure Laterality Date  . Occuloplastic surgery  2002  . Total thyroidectomy  11-03-2001    PAPILLARY THYROID CARCINOMA  . Right supraomohyoid neck dissection   03-08-2003    ZONES 1,2,3;   SUBMANDIBULAR MASS / METASTATIC SQUAMOUS CELL CARCINOMA RIGHT NECK  . Pars plana vitrectomy  11-06-2004    RIGHT EYE RADIATION RETINOPATHY W/ HEMORRHAGE  . Left hydrocelectomy  03-29-2005    AND REPAIR LEFT INGUINAL HERNIA W/ MESH  . Nasal endoscopy  08-07-2005    RIGHT EPISTAXIS  / POST SEPTOPLASTY  (HX RIGHT ORBITAL CA &  S/P RADIATION/ NECROSIS ANTERIOR END OF BOTH INFERIOR TURBINATES)  . Cardiac catheterization  01-16-2006  DR THOMAS WALL    MILD CORONARY ATHEROSCLEROSIS/ MID TO DISTAL LAD 40% STENOSIS/ LVF 50-55%  . Shoulder arthroscopy w/ subacromial decompression and distal clavicle excision  10-09-2008    AND DEBRIDEMENT OF RIGHT SHOULDER IMPINGEMENT & [facility] JOINT ARTHRITIS  . Right ankle arthroscopy w/ extensive debridement  04-05-2008   . Left ankle arthroscopy w/ debridement  05-12-2007  . Mohs surgery  2002    RIGHT ORBITAL CANCER  . Repair undesended right testicle / right inguinal hernia  AGE 57  . Septoplasty  NOV 2006  . Knee arthroscopy  05/01/2012    Procedure: ARTHROSCOPY  KNEE;  Surgeon: Javier Docker, MD;  Location: [facility];  Service: Orthopedics;  Laterality: Left;  debridement and removal of loose body    reports that he has never smoked. He has never used smokeless tobacco. He reports that he does not drink alcohol or use illicit drugs. family history includes Depression in his daughter and sister; Drug abuse in his daughter. Allergies  Allergen Reactions  . Percocet [Oxycodone-Acetaminophen] Itching    Can take generic.  States only has a problem with percocet brand     Current Outpatient Prescriptions on File Prior to Visit  Medication Sig Dispense Refill  . amLODipine (NORVASC) 5 MG tablet Take 1 tablet (5 mg total) by mouth daily. Take at hs  90 tablet  3  . aspirin EC 81 MG tablet Take 1 tablet (81 mg total) by mouth daily.      . Coenzyme Q10 (CO Q 10 PO) Take by mouth daily.      [name] guaiFENesin (MUCINEX) 600 MG 12 hr tablet Take 600 mg by mouth 2 (two) times daily as needed. For congestion      . Immune Globulin, Human, (HIZENTRA) 4 GM/20ML SOLN Inject into the skin once a week. wednesday      . LamoTRIgine (LAMICTAL XR) 300 MG TB24 Take 1 tablet (300 mg total) by mouth daily.  90 tablet  0  . LORazepam (ATIVAN) 0.5 MG tablet TAKE 1 TABLET BY MOUTH TWICE A DAY AS NEEDED FOR ANXIETY  60 tablet  1  . naproxen (NAPROSYN) 500 MG tablet Take 500 mg by mouth 2 (two) times daily as needed. For pain      . OMEGA-3 KRILL OIL PO Take by mouth daily.      . RABEprazole (ACIPHEX) 20 MG tablet TAKE 1 TABLET BY MOUTH EVERY DAY  90 tablet  2  . SYNTHROID 150 MCG tablet TAKE 1 TABLET AT BEDTIME  90 tablet  0  . Tamsulosin HCl (FLOMAX) 0.4 MG CAPS Take 0.4 mg by mouth as needed.      [name] telmisartan (MICARDIS) 80 MG tablet Take 1 tablet (80 mg total) by mouth daily.  90 tablet  3  . Testosterone (AXIRON) 30 MG/ACT SOLN Place 2 Act onto the skin every morning.      . zolpidem (AMBIEN) 5 MG tablet TAKE 1 TABLET BY MOUTH AT BEDTIME AS NEEDED  30  tablet  1  . atorvastatin (LIPITOR) 40 MG tablet Take 40 mg by mouth at bedtime.      . hydroxypropyl methylcellulose (ISOPTO TEARS) 2.5 % ophthalmic solution Place 1 drop into both eyes 4 (four) times daily as needed. For dry eyes      . terbinafine (LAMISIL) 250 MG tablet Take 250 mg by mouth every morning.       [name]  Umeclidinium-Vilanterol (ANORO ELLIPTA) 62.5-25 MCG/INH AEPB Inhale 1 Act into the lungs daily.  1 each  11   No current facility-administered medications on file prior to visit.     Review of Systems  Constitutional: Negative for appetite change, fatigue and unexpected weight change.  HENT: Negative for congestion, nosebleeds, sneezing, sore throat and trouble swallowing.   Eyes: Negative for itching and visual disturbance.  Respiratory: Negative for cough.   Cardiovascular: Negative for chest pain, palpitations and leg swelling.  Gastrointestinal: Negative for nausea, diarrhea, blood in stool and abdominal distention.  Genitourinary: Negative for frequency and hematuria.  Musculoskeletal: Negative for back pain, gait problem, joint swelling and neck pain.  Skin: Negative for rash.  Neurological: Negative for dizziness, tremors, speech difficulty and weakness.  Psychiatric/Behavioral: Negative for suicidal ideas, sleep disturbance, dysphoric mood and agitation. The patient is nervous/anxious.    Prostate was checkked by Dr D.    Objective:   Physical Exam  Constitutional: He is oriented to person, place, and time. He appears well-developed. No distress.  HENT:  Mouth/Throat: Oropharynx is clear and moist.  Eyes: Conjunctivae are normal. Pupils are equal, round, and reactive to light.  Neck: Normal range of motion. No JVD present. No thyromegaly present.  Cardiovascular: Normal rate, regular rhythm, normal heart sounds and intact distal pulses.  Exam reveals no gallop and no friction rub.   No murmur heard. Pulmonary/Chest: Effort normal and breath sounds normal. No  respiratory distress. He has no wheezes. He has no rales. He exhibits no tenderness.  Abdominal: Soft. Bowel sounds are normal. He exhibits no distension and no mass. There is no tenderness. There is no rebound and no guarding.  Genitourinary: Rectum normal and prostate normal. Guaiac negative stool.  Testes nl  Musculoskeletal: Normal range of motion. He exhibits no edema and no tenderness.  Lymphadenopathy:    He has no cervical adenopathy.  Neurological: He is alert and oriented to person, place, and time. He has normal reflexes. No cranial nerve deficit. He exhibits normal muscle tone. Coordination normal.  Skin: Skin is warm and dry. No rash noted.  Psychiatric: He has a normal mood and affect. His behavior is normal. Judgment and thought content normal.  anxious  Gingivitis Caries  Lab Results  Component Value Date   WBC 7.1 05/27/2013   HGB 15.3 05/27/2013   HCT 45.7 05/27/2013   PLT 235.0 05/27/2013   GLUCOSE 97 05/27/2013   CHOL 179 05/27/2013   TRIG 109.0 05/27/2013   HDL 62.90 05/27/2013   LDLDIRECT 134.7 03/05/2007   LDLCALC 94 05/27/2013   ALT 40 05/27/2013   AST 30 05/27/2013   NA 140 05/27/2013   K 4.2 05/27/2013   CL 103 05/27/2013   CREATININE 1.0 05/27/2013   BUN 22 05/27/2013   CO2 27 05/27/2013   TSH 2.05 05/27/2013   PSA 1.59 05/27/2013   HGBA1C 6.1 08/04/2012          Assessment & Plan:

## 2013-05-31 NOTE — Patient Instructions (Addendum)
Use waterpik  Use [employer] total or [employer] baking soda toothpaste  Milk free trial (no milk, ice cream, cheese and yogurt) for 4-6 weeks. OK to use almond, coconut, rice milk. "Almond breeze" brand tastes good.  Eye check up

## 2013-05-31 NOTE — Assessment & Plan Note (Signed)
Continue with current prescription therapy as reflected on the Med list.  

## 2013-05-31 NOTE — Assessment & Plan Note (Addendum)
Resolved sx's CXR

## 2013-05-31 NOTE — Assessment & Plan Note (Signed)
2012 [facility] S/p laser Rx

## 2013-05-31 NOTE — Assessment & Plan Note (Signed)
R side 10/14 Dr Yevette Edwards Will try Gabapentin at low dose

## 2013-06-01 ENCOUNTER — Ambulatory Visit
Admission: RE | Admit: 2013-06-01 | Discharge: 2013-06-01 | Disposition: A | Discharge status: Home / Self Care | Payer: Managed Care, Other (non HMO) | Source: Ambulatory Visit | Attending: Orthopedic Surgery | Admitting: Orthopedic Surgery

## 2013-06-01 ENCOUNTER — Ambulatory Visit (HOSPITAL_COMMUNITY): Payer: Managed Care, Other (non HMO) | Admitting: Psychiatry

## 2013-06-02 ENCOUNTER — Other Ambulatory Visit: Payer: Self-pay | Admitting: Internal Medicine

## 2013-06-08 ENCOUNTER — Ambulatory Visit (HOSPITAL_COMMUNITY): Payer: Managed Care, Other (non HMO) | Admitting: Psychiatry

## 2013-06-22 ENCOUNTER — Ambulatory Visit (INDEPENDENT_AMBULATORY_CARE_PROVIDER_SITE_OTHER): Payer: Commercial Indemnity | Admitting: Licensed Clinical Social Worker

## 2013-06-22 DIAGNOSIS — F3132 Bipolar disorder, current episode depressed, moderate: Secondary | ICD-10-CM

## 2013-06-22 NOTE — Progress Notes (Signed)
   THERAPIST PROGRESS NOTE  Session Time: 1:00pm-1:50pm  Participation Level: Active  Behavioral Response: Well GroomedAlertAnxious and Depressed  Type of Therapy: Individual Therapy  Treatment Goals addressed: Coping  Interventions: CBT, DBT, Motivational Interviewing, Strength-based, Supportive and Reframing  Summary: Alex [name] is a 57 y.o. male who presents with depressed mood and anxious affect. He reports ongoing stress at home and processes how he feels hopeless about any changes that his children or his wife will make. He continues to feel frustrated by the lack of support and compassion he receives from his family. He has a brace on his arm which may require surgery. His self care is improving slowly, but remains inconsistent. His sleep is wnl and his appetite is inconsistent.    Suicidal/Homicidal: Nowithout intent/plan  Therapist Response: Assessed patients current functioning and reviewed progress. Reviewed coping strategies. Assessed patients safety and assisted in identifying protective factors.  Reviewed crisis plan with patient. Assisted patient with the expression of anxiety. Reviewed patients self care plan. Assessed progress related to self care. Patients self care is fair. Recommend daily exercise, increased socialization and recreation. Used CBT to assist patient with the identification of negative distortions and irrational thoughts. Encouraged patient to verbalize alternative and factual responses which challenge thought distortions. Used DBT to practice mindfulness, review distraction list and improve distress tolerance skills. Used motivational interviewing to assist and encourage patient through the change process. Explored patients barriers to change.   Plan: Return again in two weeks.  Diagnosis: Axis I: Bipolar, Depressed    Axis II: No diagnosis    Marrie Chandra, LCSW 06/22/2013

## 2013-07-05 ENCOUNTER — Ambulatory Visit (INDEPENDENT_AMBULATORY_CARE_PROVIDER_SITE_OTHER): Payer: Commercial Indemnity | Admitting: Licensed Clinical Social Worker

## 2013-07-05 DIAGNOSIS — F3132 Bipolar disorder, current episode depressed, moderate: Secondary | ICD-10-CM

## 2013-07-05 NOTE — Progress Notes (Signed)
   THERAPIST PROGRESS NOTE  Session Time: 11:30am-12:20pm  Participation Level: Active  Behavioral Response: Well GroomedAlertAnxious and Depressed  Type of Therapy: Individual Therapy  Treatment Goals addressed: Coping  Interventions: CBT, DBT, Motivational Interviewing, Strength-based, Supportive and Reframing  Summary: Alex [name] is a 57 y.o. male who presents with depressed mood and anxious affect. He reports ongoing stress at home, in his marriage and with his children. He continues to feel left out and uncared for by his wife and his children. He describes being bullied at home and how his children join his wife in mocking him. He does not speak up and set limits because he believes this will cause greater conflict. He expresses his desire to be loved by his wife and his struggle to accept the marriage for what it is. Patient demonstrates learned helplessness and a limited capacity for change. His sleep and appetite are wnl.   Suicidal/Homicidal: Nowithout intent/plan  Therapist Response: Assessed patients current functioning and reviewed progress. Reviewed coping strategies. Assessed patients safety and assisted in identifying protective factors.  Reviewed crisis plan with patient. Assisted patient with the expression of anxiety and frustration. Reviewed patients self care plan. Assessed progress related to self care. Patients self care is god. Recommend daily exercise, increased socialization and recreation. Used CBT to assist patient with the identification of negative distortions and irrational thoughts. Encouraged patient to verbalize alternative and factual responses which challenge thought distortions. Used DBT to practice mindfulness, review distraction list and improve distress tolerance skills. Reviewed healthy boundaries and assertive communication. Used motivational interviewing to assist and encourage patient through the change process. Explored patients barriers to  change.   Plan: Return again in two weeks.  Diagnosis: Axis I: Bipolar, Depressed    Axis II: No diagnosis    Fleetwood Pierron, LCSW 07/05/2013

## 2013-07-19 ENCOUNTER — Ambulatory Visit (INDEPENDENT_AMBULATORY_CARE_PROVIDER_SITE_OTHER): Payer: Commercial Indemnity | Admitting: Licensed Clinical Social Worker

## 2013-07-19 DIAGNOSIS — F3132 Bipolar disorder, current episode depressed, moderate: Secondary | ICD-10-CM

## 2013-07-19 NOTE — Progress Notes (Signed)
   THERAPIST PROGRESS NOTE  Session Time: 1:00pm-1:50pm  Participation Level: Active  Behavioral Response: Well GroomedAlertAnxious and Depressed  Type of Therapy: Individual Therapy  Treatment Goals addressed: Coping  Interventions: CBT, DBT, Motivational Interviewing, Strength-based, Supportive and Reframing  Summary: Alex [name] is a 57 y.o. male who presents with depressed mood and anxious affect. He reports ongoing and increased stress related to his family. He processes his anger with his father's interactions with him and his family. He processes his fear over his son's continued SIB. He endorses anxiety over the possibility that his son will commit suicide if he looses his job. Patient continues to demonstrate a struggle with appropriate boundaries and wants to rescue his children. His sleep is disrupted and his appetite is wnl. He reports well controlled anger.    Suicidal/Homicidal: Nowithout intent/plan  Therapist Response: Assessed patients current functioning and reviewed progress. Reviewed coping strategies. Assessed patients safety and assisted in identifying protective factors.  Reviewed crisis plan with patient. Assisted patient with the expression of anxiety . Reviewed patients self care plan. Assessed progress related to self care. Patients self care is good. Recommend daily exercise, increased socialization and recreation. Used CBT to assist patient with the identification of negative distortions and irrational thoughts. Encouraged patient to verbalize alternative and factual responses which challenge thought distortions. Used DBT to practice mindfulness, review distraction list and improve distress tolerance skills. Reviewed healthy boundaries and assertive communication.   Plan: Return again in two weeks.  Diagnosis: Axis I: Bipolar, Depressed    Axis II: No diagnosis    Derry Kassel, LCSW 07/19/2013

## 2013-08-08 ENCOUNTER — Other Ambulatory Visit (HOSPITAL_COMMUNITY): Payer: Self-pay | Admitting: Psychiatry

## 2013-08-08 DIAGNOSIS — F39 Unspecified mood [affective] disorder: Secondary | ICD-10-CM

## 2013-08-10 ENCOUNTER — Ambulatory Visit (HOSPITAL_COMMUNITY): Payer: Self-pay | Admitting: Licensed Clinical Social Worker

## 2013-08-21 ENCOUNTER — Other Ambulatory Visit (HOSPITAL_COMMUNITY): Payer: Self-pay | Admitting: Psychiatry

## 2013-08-21 DIAGNOSIS — F39 Unspecified mood [affective] disorder: Secondary | ICD-10-CM

## 2013-08-22 ENCOUNTER — Other Ambulatory Visit: Payer: Self-pay | Admitting: Cardiology

## 2013-08-25 ENCOUNTER — Telehealth: Payer: Self-pay | Admitting: *Deleted

## 2013-08-25 NOTE — Telephone Encounter (Signed)
Patient phoned requesting that his synthroid 150 mcg be sent to his pharmacy with (requesting generic form; DAW-5 and a 90 day supply). Last OV with PCP 05/31/13. Please advise.  CB# [phone]

## 2013-08-25 NOTE — Telephone Encounter (Signed)
Ok Thx 

## 2013-08-26 ENCOUNTER — Other Ambulatory Visit (HOSPITAL_COMMUNITY): Payer: Self-pay | Admitting: Psychiatry

## 2013-08-26 ENCOUNTER — Other Ambulatory Visit: Payer: Self-pay | Admitting: *Deleted

## 2013-08-26 DIAGNOSIS — F39 Unspecified mood [affective] disorder: Secondary | ICD-10-CM

## 2013-08-26 MED ORDER — LEVOTHYROXINE SODIUM 150 MCG PO TABS: 150.0000 ug | ORAL_TABLET | Freq: Every day | ORAL | Status: DC

## 2013-08-26 MED ORDER — SYNTHROID 150 MCG PO TABS: 150.0000 ug | ORAL_TABLET | Freq: Every day | ORAL | Status: DC

## 2013-08-26 NOTE — Telephone Encounter (Signed)
Ok brand [employer]

## 2013-08-26 NOTE — Telephone Encounter (Signed)
Patient wasn't clear when he phoned yesterday, generic form was not what he was requesting, brand form---at generic price.

## 2013-08-26 NOTE — Telephone Encounter (Signed)
I've changed script in system and submitted to pt pharmacy & notified patient.

## 2013-08-31 ENCOUNTER — Ambulatory Visit (HOSPITAL_COMMUNITY): Payer: Managed Care, Other (non HMO) | Admitting: Psychiatry

## 2013-09-01 ENCOUNTER — Ambulatory Visit (HOSPITAL_COMMUNITY): Payer: Self-pay | Admitting: Licensed Clinical Social Worker

## 2013-09-06 ENCOUNTER — Ambulatory Visit (INDEPENDENT_AMBULATORY_CARE_PROVIDER_SITE_OTHER): Payer: Commercial Indemnity | Admitting: Licensed Clinical Social Worker

## 2013-09-06 DIAGNOSIS — F3132 Bipolar disorder, current episode depressed, moderate: Secondary | ICD-10-CM

## 2013-09-06 NOTE — Progress Notes (Signed)
   THERAPIST PROGRESS NOTE  Session Time: 1:00pm-1:50pm  Participation Level: Active  Behavioral Response: Well GroomedAlertAnxious and Depressed  Type of Therapy: Individual Therapy  Treatment Goals addressed: Coping  Interventions: CBT, Motivational Interviewing, Strength-based, Supportive and Reframing  Summary: Alex [name] is a 58 y.o. male who presents with depressed mood and anxious affect. He reports ongoing conflict at home with his wife and his children. He states that he feels he is making progress because he is not arguing with his wife late into the night anymore. He remains avoidant of conflict and fearful to confront his wife and children. He did reach out at church. Informed patient of this writers departure and discussed referral. Will process next session.   Suicidal/Homicidal: Nowithout intent/plan  Therapist Response: Assessed patients current functioning and reviewed progress. Reviewed coping strategies. Assessed patients safety and assisted in identifying protective factors.  Reviewed crisis plan with patient. Assisted patient with the expression of frustration. Reviewed patients self care plan. Assessed progress related to self care. Patients self care is fair. Recommend daily exercise, increased socialization and recreation. Reviewed healthy boundaries and assertive communication. Used CBT to assist patient with the identification of negative distortions and irrational thoughts. Encouraged patient to verbalize alternative and factual responses which challenge thought distortions. Used DBT to practice mindfulness, review distraction list and improve distress tolerance skills.   Plan: Return again in two weeks.  Diagnosis: Axis I: Bipolar, Depressed    Axis II: No diagnosis    Candita Borenstein, LCSW 09/06/2013

## 2013-09-08 ENCOUNTER — Ambulatory Visit (HOSPITAL_COMMUNITY): Payer: Managed Care, Other (non HMO) | Admitting: Psychiatry

## 2013-09-13 ENCOUNTER — Encounter (HOSPITAL_COMMUNITY): Payer: Self-pay | Admitting: Psychiatry

## 2013-09-13 ENCOUNTER — Ambulatory Visit (INDEPENDENT_AMBULATORY_CARE_PROVIDER_SITE_OTHER): Payer: Commercial Indemnity | Admitting: Psychiatry

## 2013-09-13 VITALS — BP 97/79 | HR 100 | Ht 72.0 in | Wt 180.8 lb

## 2013-09-13 DIAGNOSIS — F39 Unspecified mood [affective] disorder: Secondary | ICD-10-CM

## 2013-09-13 MED ORDER — LORAZEPAM 0.5 MG PO TABS: ORAL_TABLET | ORAL | Status: DC

## 2013-09-13 MED ORDER — ZOLPIDEM TARTRATE 5 MG PO TABS: ORAL_TABLET | ORAL | Status: DC

## 2013-09-13 MED ORDER — LAMOTRIGINE ER 300 MG PO TB24: ORAL_TABLET | ORAL | Status: DC

## 2013-09-13 NOTE — Progress Notes (Signed)
[place] Progress Note  Alex [name] [id number] 58 y.o.  09/13/2013 2:51 PM  Chief Complaint:  Medication management and followup.      History of Present Illness:  Alex [name] came for his followup appointment.  His complaint as Lamictal, Ativan and Ambien.  He is taking on and off gabapentin.  He continues to concern about his physical health.  He has lot of hip pain and he is actively looking for a pain specialist.  He is trying to get appointment with Dr. Lovenia Shuck who is a pain specialist.  Overall his psychiatric condition is stable .  His daughter got married in [name].  Patient attended the ceremony .  He was happy about her.  She denies any side effects of medication.  He is very anxious and concerned about leaving his therapist .  Patient was seen Alex [name] who is leaving the practice .  Patient wants to continue therapy however he has not schedule appointment with new therapist.  Patient denies any agitation, anger, mood swing but continues to have irritability.  He denies any suicidal thoughts or homicidal thoughts.    Suicidal Ideation: No Plan Formed: No Patient has means to carry out plan: No  Homicidal Ideation: No Plan Formed: No Patient has means to carry out plan: No  Review of Systems: Psychiatric: Agitation: No Hallucination: No Depressed Mood: No Insomnia: Yes Hypersomnia: No Altered Concentration: No Feels Worthless: No Grandiose Ideas: No Belief In Special Powers: No New/Increased Substance Abuse: No Compulsions: No  Neurologic: Headache: Yes Seizure: No Paresthesias: No  Medical  history Patient has history of hypertension , immune deficiency syndrome , GERD , Tick disorder, coronary artery disease, hypertension, COPD, esophagus stricture, lumbago, osteoarthritis, malignant neoplasm of orbit, vertigo, osteoarthritis and chronic fatigue.   Outpatient Encounter Prescriptions as of 09/13/2013  Medication Sig  . amLODipine (NORVASC) 5 MG  tablet Take 1 tablet (5 mg total) by mouth daily. Take at hs  . aspirin EC 81 MG tablet Take 1 tablet (81 mg total) by mouth daily.  [name] atorvastatin (LIPITOR) 40 MG tablet Take 40 mg by mouth at bedtime.  [name] atorvastatin (LIPITOR) 40 MG tablet TAKE 1 TABLET BY MOUTH DAILY  . cholecalciferol (VITAMIN D) 1000 UNITS tablet Take 1 tablet (1,000 Units total) by mouth daily.  . Coenzyme Q10 (CO Q 10 PO) Take by mouth daily.  [name] gabapentin (NEURONTIN) 100 MG capsule Take 1-2 capsules (100-200 mg total) by mouth at bedtime as needed.  [name] guaiFENesin (MUCINEX) 600 MG 12 hr tablet Take 600 mg by mouth 2 (two) times daily as needed. For congestion  . hydroxypropyl methylcellulose (ISOPTO TEARS) 2.5 % ophthalmic solution Place 1 drop into both eyes 4 (four) times daily as needed. For dry eyes  . Immune Globulin, Human, (HIZENTRA) 4 GM/20ML SOLN Inject into the skin once a week. wednesday  . LamoTRIgine 300 MG TB24 TAKE 1 TABLET EVERY DAY  . LORazepam (ATIVAN) 0.5 MG tablet TAKE 1 TABLET BY MOUTH TWICE DAILY AS NEEDED FOR ANXIETY  . naproxen (NAPROSYN) 500 MG tablet Take 500 mg by mouth 2 (two) times daily as needed. For pain  . OMEGA-3 KRILL OIL PO Take by mouth daily.  . RABEprazole (ACIPHEX) 20 MG tablet TAKE 1 TABLET BY MOUTH EVERY DAY  . SYNTHROID 150 MCG tablet Take 1 tablet (150 mcg total) by mouth daily before breakfast.  . Tamsulosin HCl (FLOMAX) 0.4 MG CAPS Take 0.4 mg by mouth as needed.  [name] telmisartan (MICARDIS) 80  MG tablet Take 1 tablet (80 mg total) by mouth daily.  . Testosterone (AXIRON) 30 MG/ACT SOLN Place 2 Act onto the skin every morning.  [name] Umeclidinium-Vilanterol (ANORO ELLIPTA) 62.5-25 MCG/INH AEPB Inhale 1 Act into the lungs daily.  [name] zolpidem (AMBIEN) 5 MG tablet TAKE 1 TABLET BY MOUTH AT BEDTIME AS NEEDED  . [DISCONTINUED] LamoTRIgine 300 MG TB24 TAKE 1 TABLET EVERY DAY  . [DISCONTINUED] LORazepam (ATIVAN) 0.5 MG tablet TAKE 1 TABLET BY MOUTH TWICE DAILY AS NEEDED FOR ANXIETY  .  [DISCONTINUED] zolpidem (AMBIEN) 5 MG tablet TAKE 1 TABLET BY MOUTH AT BEDTIME AS NEEDED    Past Psychiatric History/Hospitalization(s): Patient has been seeing in this office since 2009.  He has history of depression mood swing and anger.  [facility] thoughts but he denies any suicidal attempt.  He denies any psychosis but admitted poor impulse control.  In the past he has taken Seroquel and Cymbalta.   Anxiety: Yes Bipolar Disorder: Yes Depression: Yes Mania: No Psychosis: No Schizophrenia: No Personality Disorder: No Hospitalization for psychiatric illness: Yes History of Electroconvulsive Shock Therapy: No Prior Suicide Attempts: No  Physical Exam: Constitutional:  BP 97/79  Pulse 100  Ht 6' (1.829 m)  Wt 180 lb 12.8 oz (82.01 kg)  BMI 24.52 kg/m2  General Appearance: alert, oriented, no acute distress  Musculoskeletal: Strength & Muscle Tone: within normal limits Gait & Station: normal Patient leans: N/A  Review of Systems  Neurological:       Restless   No results found for this or any previous visit (from the past 2160 hour(s)).  Mental status examination Patient is casually dressed and fairly groomed.  He is anxious but maintained a good eye contact.  He described his mood is nervous and his affect is mood appropriate.  He denies any active or passive suicidal thoughts or homicidal thoughts.  He denies any auditory or visual hallucination.  He is restless but there were no tremors or shakes present.  His psychomotor activity is slightly increased.  His thought process is logical linear and goal-directed.  There were no flight of ideas present at this time.  His fund of knowledge is adequate.  His attention and concentration is okay.  He's alert and oriented x3.  His insight judgment and impulse control is okay.   Medical Decision Making (Choose Three): Established Problem, Stable/Improving (1), Review of  Psycho-Social Stressors (1), Review of Last Therapy Session (1) and Review of Medication Regimen & Side Effects (2)  Assessment: Axis I:  bipolar disorder1  Axis II:  deferred  Axis III:  see medical history   Patient Active Problem List   Diagnosis Date Noted  . Well adult exam 05/31/2013  . Glaucoma 05/31/2013  . RML pneumonia 03/31/2013  . Fever, unspecified 03/26/2013  . Hypertension, uncontrolled 02/25/2013  . Preop exam for internal medicine 04/02/2012  . Ingrowing toenail with infection 04/02/2012  . Localized osteoarthritis of left knee 04/02/2012  . Vertigo 01/05/2012  . Ataxia 01/05/2012  . Bipolar 1 disorder, depressed, moderate 10/31/2011  . ANXIETY 08/08/2010  . COPD 11/30/2009  . ESOPHAGEAL STRICTURE 11/30/2009  . CHANGE IN BOWELS 11/30/2009  . DEPRESSION 08/07/2009  . OSTEOARTHRITIS 08/07/2009  . GENERALIZED OSTEOARTHROSIS UNSPECIFIED SITE 10/20/2008  . HYPERLIPIDEMIA-MIXED 10/19/2008  . FOOT PAIN 07/11/2008  . EPIDYDIMITIS/ORCHITIS 05/24/2008  . FATIGUE 05/14/2008  . RASH-NONVESICULAR 05/14/2008  . Cough 03/29/2008  . CORONARY ATHEROSCLEROSIS 11/04/2007  . Lumbago 11/04/2007  .  THYROIDECTOMY, HX OF 11/04/2007  . 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 NEOPLASM, MALIGNANT, THYROID GLAND 10/02/2007  . HYPOGONADISM, MALE 10/02/2007  . 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 IMPAIRED GLUCOSE TOLERANCE 10/02/2007    Axis IV: Mild to moderate  Axis V: 55-65  Plan:  Recommend to continue Lamictal, Ativan and Ambien are present dose.  Encourage him to schedule the appointment with therapist.  Discussed risk and benefits of medication.  Recommend to call us back if he has any questions or any concern.  Followup in 3 months.   ARFEEN,SYED T., MD 09/13/2013

## 2013-09-15 ENCOUNTER — Ambulatory Visit (HOSPITAL_COMMUNITY): Payer: Self-pay | Admitting: Licensed Clinical Social Worker

## 2013-09-15 ENCOUNTER — Encounter (HOSPITAL_COMMUNITY): Payer: Self-pay | Admitting: Licensed Clinical Social Worker

## 2013-09-15 NOTE — Progress Notes (Signed)
Patient ID: Alex [name], male   DOB: Dec 12, 1955, 58 y.o.   MRN: [id number] Patient was a no show no call for todays appointment. Today would have been patients last session. Have already informed patient about transferring care to [place].

## 2013-09-20 ENCOUNTER — Telehealth: Payer: Self-pay | Admitting: *Deleted

## 2013-09-20 DIAGNOSIS — E785 Hyperlipidemia, unspecified: Secondary | ICD-10-CM

## 2013-09-20 NOTE — Telephone Encounter (Signed)
I called pt per MD re: the recent labs pt had done at [facility]. I advised him his TG are not too bad. MD advises low fat diet and recheck lipids in 2 months. Sent copy of labs to be scanned.

## 2013-09-29 ENCOUNTER — Ambulatory Visit (HOSPITAL_COMMUNITY): Payer: Self-pay | Admitting: Licensed Clinical Social Worker

## 2013-10-06 ENCOUNTER — Telehealth (HOSPITAL_COMMUNITY): Payer: Self-pay

## 2013-10-16 ENCOUNTER — Other Ambulatory Visit: Payer: Self-pay | Admitting: Cardiology

## 2013-10-27 ENCOUNTER — Other Ambulatory Visit: Payer: Self-pay | Admitting: Internal Medicine

## 2013-11-02 ENCOUNTER — Other Ambulatory Visit (HOSPITAL_COMMUNITY): Payer: Self-pay | Admitting: *Deleted

## 2013-11-02 DIAGNOSIS — F39 Unspecified mood [affective] disorder: Secondary | ICD-10-CM

## 2013-11-02 MED ORDER — ZOLPIDEM TARTRATE 5 MG PO TABS: ORAL_TABLET | ORAL | Status: DC

## 2013-11-17 ENCOUNTER — Telehealth: Payer: Self-pay | Admitting: Internal Medicine

## 2013-11-17 ENCOUNTER — Other Ambulatory Visit (HOSPITAL_COMMUNITY): Payer: Self-pay | Admitting: Psychiatry

## 2013-11-17 ENCOUNTER — Telehealth: Payer: Self-pay | Admitting: Cardiology

## 2013-11-17 DIAGNOSIS — F329 Major depressive disorder, single episode, unspecified: Secondary | ICD-10-CM

## 2013-11-17 NOTE — Telephone Encounter (Signed)
Called stating he has felt more fatigued recently and BP has been low.  Ranging from 100/60; 106/64 today.  States that is low for him.  He has not been seen in our office since 07/2012. States he called Dr. Judeen Hammans office and they couldn't see him today and was told to call our office. They advised him to go to ER but pt states he doesn't want to go to ER because he is not having any CP or SOB. Advised no one could see him today since it is 4:00.  Looked at all extenders and no one has an available appointment until 4/22.  Encouraged him to make sure he stays hydrated and continue to monitor BP. Suggested he call Dr. Judeen Hammans office back to see if someone could see him in the near future since they have been monitoring his medications.  Advised he could call us back and will add him later in the month for an office visit. He is agreeable with the plan. Still adamant that he does not want to go to ER or urgent care due to cost.  Will monitor his BP.

## 2013-11-17 NOTE — Telephone Encounter (Signed)
New message     bp is 103/67.  Pt said this is not normal for him.

## 2013-11-17 NOTE — Telephone Encounter (Signed)
Patient Information:  Caller Name: Durante  Phone: [phone]  Patient: Alex [name], Alex [name]  Gender: Male  DOB: Nov 09, 1955  Age: 58 Years  PCP: Plotnikov, Alex (Adults only)  Office Follow Up:  Does the office need to follow up with this patient?: No  Instructions For The Office: N/A  RN Note:  Reports mild to moderate dizziness aggravated by bending over of getting up from lying down or chair.  Dozed off while working on computer 11/16/13.  Wakes at least 1 X night to void. Reported episode of heart fluttering when laid down at 1130 lasting 1-2 minutes. No appointments remain at [facility] or [facility] office for 11/17/13. Called office regarding ED vs office appointment since office is open.  Per Nisha/office nurse, RN instructed Jayvion to go to [facility] ED.  Reported he might call his cardiologist.  Symptoms  Reason For Call & Symptoms: Chronic fatigue for past week with dizziness when stands up, worsening tinnitus, and hypotension.  BP 100/60; Pulse 80 at 1130.  Reviewed Health History In EMR: Yes  Reviewed Medications In EMR: Yes  Reviewed Allergies In EMR: Yes  Reviewed Surgeries / Procedures: Yes  Date of Onset of Symptoms: 11/10/2013  Guideline(s) Used:  Weakness (Generalized) and Fatigue  Disposition Per Guideline:   Go to ED Now  Reason For Disposition Reached:   Extra heartbeats OR irregular heart beating (i.e., "palpitations")  Advice Given:  Reassurance  Weakness often accompanies viral illnesses (e.g., colds and flu).  Call Back If:  Unable to stand or walk  Passes out  Breathing difficulty occurs  You become worse.  Patient Will Follow Care Advice:  YES

## 2013-11-19 ENCOUNTER — Ambulatory Visit: Payer: Self-pay | Admitting: Internal Medicine

## 2013-11-22 ENCOUNTER — Other Ambulatory Visit (INDEPENDENT_AMBULATORY_CARE_PROVIDER_SITE_OTHER): Payer: Commercial Indemnity

## 2013-11-22 DIAGNOSIS — E785 Hyperlipidemia, unspecified: Secondary | ICD-10-CM

## 2013-11-22 LAB — LIPID PANEL
Cholesterol: 154 mg/dL (ref 0–200)
HDL: 57.8 mg/dL (ref >39.00)
LDL Cholesterol: 76 mg/dL (ref 0–99)
Total CHOL/HDL Ratio: 3
Triglycerides: 102 mg/dL (ref 0.0–149.0)
VLDL: 20.4 mg/dL (ref 0.0–40.0)

## 2013-11-23 ENCOUNTER — Ambulatory Visit (INDEPENDENT_AMBULATORY_CARE_PROVIDER_SITE_OTHER): Payer: Commercial Indemnity | Admitting: Internal Medicine

## 2013-11-23 ENCOUNTER — Encounter: Payer: Self-pay | Admitting: Internal Medicine

## 2013-11-23 VITALS — BP 148/84 | HR 76 | Temp 97.3°F | Resp 16 | Wt 182.0 lb

## 2013-11-23 DIAGNOSIS — Z23 Encounter for immunization: Secondary | ICD-10-CM

## 2013-11-23 DIAGNOSIS — F411 Generalized anxiety disorder: Secondary | ICD-10-CM

## 2013-11-23 DIAGNOSIS — F3132 Bipolar disorder, current episode depressed, moderate: Secondary | ICD-10-CM

## 2013-11-23 DIAGNOSIS — I10 Essential (primary) hypertension: Secondary | ICD-10-CM

## 2013-11-23 DIAGNOSIS — M1990 Unspecified osteoarthritis, unspecified site: Secondary | ICD-10-CM

## 2013-11-23 DIAGNOSIS — I2510 Atherosclerotic heart disease of native coronary artery without angina pectoris: Secondary | ICD-10-CM

## 2013-11-23 DIAGNOSIS — F329 Major depressive disorder, single episode, unspecified: Secondary | ICD-10-CM

## 2013-11-23 DIAGNOSIS — R7309 Other abnormal glucose: Secondary | ICD-10-CM

## 2013-11-23 DIAGNOSIS — D839 Common variable immunodeficiency, unspecified: Secondary | ICD-10-CM

## 2013-11-23 DIAGNOSIS — M159 Polyosteoarthritis, unspecified: Secondary | ICD-10-CM

## 2013-11-23 DIAGNOSIS — F3289 Other specified depressive episodes: Secondary | ICD-10-CM

## 2013-11-23 MED ORDER — NAPROXEN SODIUM ER 500 MG PO TB24: 500.0000 mg | ORAL_TABLET | Freq: Every day | ORAL | Status: DC

## 2013-11-23 NOTE — Progress Notes (Signed)
Subjective:      C/o stress, dizziness, SI joint pain, pains in other joints  Wt Readings from Last 3 Encounters:  11/23/13 182 lb (82.555 kg)  09/13/13 180 lb 12.8 oz (82.01 kg)  05/31/13 179 lb (81.194 kg)   BP Readings from Last 3 Encounters:  11/23/13 148/84  09/13/13 97/79  05/31/13 111/85    Hypertension This is a chronic problem. The current episode started more than 1 year ago. The problem has been rapidly worsening (stressed a lot lately) since onset. Pertinent negatives include no chest pain, neck pain or palpitations.  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 Alex [name] tried to overdose himself several times 6/14, 10/14  One dtr has moved in - stress is worse    BP Readings from Last 3 Encounters:  11/23/13 148/84  09/13/13 97/79  05/31/13 111/85   Wt Readings from Last 3 Encounters:  11/23/13 182 lb (82.555 kg)  09/13/13 180 lb 12.8 oz (82.01 kg)  05/31/13 179 lb (81.194 kg)   BP 148/84  Pulse 76  Temp(Src) 97.3 F (36.3 C) (Oral)  Resp 16  Wt 182 lb (82.555 kg)  Past Medical History  Diagnosis Date  . Anxiety   . Hypertension   . Hyperlipidemia   . Immune deficiency disorder   . GERD (gastroesophageal reflux disease)   . Glaucoma BOTH EYES -- NO DROPS   . History of thyroid cancer PRIMARY (NO METS FROM ORBITAL CANCER)--   IN REMISSION    S/P TOTAL THYROIDECTOMY  , CHEMORADIATION  (ONCOLOGIST -- DR Griffith Citron)  . 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  Dysuria   . Prostatitis   . Epididymitis, left   . Renal calculi LEFT KIDNEY-- NON-OBSTRUCTIVE  . Depression   . Bipolar I disorder   . Complication of anesthesia POST URINARY RETENTION---  2006 SHOULDER SURGERY MARKED BRADYCARDIA VAGAL RESPONSE NO ISSUE W/ SURGERY AFTER THIS ONE  . BPH (benign prostatic hypertrophy) with urinary obstruction   . Positional vertigo   . Left flank pain   . Coronary atherosclerosis CARDIOLOGIST- DR CRENSHAW--  LAST VISIT 01-05-2012 IN [facility]    NON-OBSTRUCTIVE MILD DISEASE  . Urinary hesitancy   . Left knee pain CHONDRAL FLAP TEAR   Past Surgical History  Procedure Laterality Date  . Occuloplastic surgery  2002  . Total thyroidectomy  11-03-2001    PAPILLARY THYROID CARCINOMA  . Right supraomohyoid neck dissection   03-08-2003    ZONES 1,2,3;   SUBMANDIBULAR MASS / METASTATIC SQUAMOUS CELL CARCINOMA RIGHT NECK  . Pars plana vitrectomy  11-06-2004    RIGHT EYE RADIATION RETINOPATHY W/ HEMORRHAGE  . Left hydrocelectomy  03-29-2005    AND REPAIR LEFT INGUINAL HERNIA W/ MESH  . Nasal endoscopy  08-07-2005    RIGHT EPISTAXIS  / POST SEPTOPLASTY  (HX RIGHT ORBITAL CA & S/P RADIATION/ NECROSIS ANTERIOR END OF BOTH INFERIOR TURBINATES)  . Cardiac catheterization  01-16-2006  DR Marcello Moores  [facility]    MILD CORONARY ATHEROSCLEROSIS/ MID TO DISTAL LAD 40% STENOSIS/ LVF 50-55%  . Shoulder arthroscopy w/ subacromial decompression and distal clavicle excision  10-09-2008    AND DEBRIDEMENT OF RIGHT SHOULDER IMPINGEMENT & [facility] JOINT ARTHRITIS  . Right ankle arthroscopy w/ extensive debridement  04-05-2008   . Left ankle arthroscopy w/ debridement  05-12-2007  . Mohs surgery  2002    RIGHT ORBITAL CANCER  . Repair undesended right testicle / right inguinal hernia  AGE 58  . Septoplasty  NOV 2006  . Knee arthroscopy  05/01/2012    Procedure: ARTHROSCOPY KNEE;  Surgeon: Johnn Hai, MD;  Location: [facility];  Service: Orthopedics;  Laterality: Left;   debridement and removal of loose body    reports that he has never smoked. He has never used smokeless tobacco. He reports that he does not drink alcohol or use illicit drugs. family history includes Depression in his daughter and sister; Drug abuse in his daughter. Allergies  Allergen Reactions  . Percocet [Oxycodone-Acetaminophen] Itching    Can take generic.  States only has a problem with percocet brand     Current Outpatient Prescriptions on File Prior to Visit  Medication Sig Dispense Refill  . aspirin EC 81 MG tablet Take 1 tablet (81 mg total) by mouth daily.      [name] atorvastatin (LIPITOR) 40 MG tablet TAKE 1 TABLET BY MOUTH ONCE DAILY  30 tablet  0  . Coenzyme Q10 (CO Q 10 PO) Take by mouth daily.      [name] guaiFENesin (MUCINEX) 600 MG 12 hr tablet Take 600 mg by mouth 2 (two) times daily as needed. For congestion      . hydroxypropyl methylcellulose (ISOPTO TEARS) 2.5 % ophthalmic solution Place 1 drop into both eyes 4 (four) times daily as needed. For dry eyes      . Immune Globulin, Human, (HIZENTRA) 4 GM/20ML SOLN Inject into the skin once a week. wednesday      . LamoTRIgine 300 MG TB24 TAKE 1 TABLET EVERY DAY  90 tablet  0  . LORazepam (ATIVAN) 0.5 MG tablet TAKE 1 TABLET BY MOUTH TWICE A DAY AS NEEDED FOR ANXIETY  60 tablet  1  . naproxen (NAPROSYN) 500 MG tablet Take 500 mg by mouth 2 (two) times daily as needed. For pain      . OMEGA-3 KRILL OIL PO Take by mouth daily.      . RABEprazole (ACIPHEX) 20 MG tablet TAKE 1 TABLET BY MOUTH EVERY DAY  90 tablet  2  . SYNTHROID 150 MCG tablet Take 1 tablet (150 mcg total) by mouth daily before breakfast.  90 tablet  3  . telmisartan (MICARDIS) 80 MG tablet TAKE 1 TABLET (80 MG TOTAL) BY MOUTH DAILY.  90 tablet  3  . Testosterone (AXIRON) 30 MG/ACT SOLN Place 2 Act onto the skin every morning.      . zolpidem (AMBIEN) 5 MG tablet TAKE 1 TABLET BY MOUTH AT BEDTIME AS NEEDED  30 tablet  0  . amLODipine (NORVASC) 5 MG tablet Take 1  tablet (5 mg total) by mouth daily. Take at hs  90 tablet  3  . cholecalciferol (VITAMIN D) 1000 UNITS tablet Take 1 tablet (1,000 Units total) by mouth daily.  100 tablet  3  . gabapentin (NEURONTIN) 100 MG capsule Take 1-2 capsules (100-200 mg total) by mouth at bedtime as needed.  90 capsule  1  . Tamsulosin HCl (FLOMAX) 0.4  MG CAPS Take 0.4 mg by mouth as needed.      [name] Umeclidinium-Vilanterol (ANORO ELLIPTA) 62.5-25 MCG/INH AEPB Inhale 1 Act into the lungs daily.  1 each  11   No current facility-administered medications on file prior to visit.     Review of Systems  Constitutional: Negative for appetite change, fatigue and unexpected weight change.  HENT: Negative for congestion, nosebleeds, sneezing, sore throat and trouble swallowing.   Eyes: Negative for itching and visual disturbance.  Respiratory: Negative for cough.   Cardiovascular: Negative for chest pain, palpitations and leg swelling.  Gastrointestinal: Negative for nausea, diarrhea, blood in stool and abdominal distention.  Genitourinary: Negative for frequency and hematuria.  Musculoskeletal: Negative for back pain, gait problem, joint swelling and neck pain.  Skin: Negative for rash.  Neurological: Negative for dizziness, tremors, speech difficulty and weakness.  Psychiatric/Behavioral: Negative for suicidal ideas, sleep disturbance, dysphoric mood and agitation. The patient is nervous/anxious.    Prostate was checkked by Dr D.    Objective:   Physical Exam  Constitutional: He is oriented to person, place, and time. He appears well-developed. No distress.  HENT:  Mouth/Throat: Oropharynx is clear and moist.  Eyes: Conjunctivae are normal. Pupils are equal, round, and reactive to light.  Neck: Normal range of motion. No JVD present. No thyromegaly present.  Cardiovascular: Normal rate, regular rhythm, normal heart sounds and intact distal pulses.  Exam reveals no gallop and no friction rub.   No murmur  heard. Pulmonary/Chest: Effort normal and breath sounds normal. No respiratory distress. He has no wheezes. He has no rales. He exhibits no tenderness.  Abdominal: Soft. Bowel sounds are normal. He exhibits no distension and no mass. There is no tenderness. There is no rebound and no guarding.  Genitourinary: Rectum normal and prostate normal. Guaiac negative stool.  Testes nl  Musculoskeletal: Normal range of motion. He exhibits no edema and no tenderness.  Lymphadenopathy:    He has no cervical adenopathy.  Neurological: He is alert and oriented to person, place, and time. He has normal reflexes. No cranial nerve deficit. He exhibits normal muscle tone. Coordination normal.  Skin: Skin is warm and dry. No rash noted.  Psychiatric: He has a normal mood and affect. His behavior is normal. Judgment and thought content normal.  anxious  Gingivitis Caries R elbow is in a brace SI tender B  Lab Results  Component Value Date   WBC 7.1 05/27/2013   HGB 15.3 05/27/2013   HCT 45.7 05/27/2013   PLT 235.0 05/27/2013   GLUCOSE 97 05/27/2013   CHOL 154 11/22/2013   TRIG 102.0 11/22/2013   HDL 57.80 11/22/2013   LDLDIRECT 134.7 03/05/2007   LDLCALC 76 11/22/2013   ALT 40 05/27/2013   AST 30 05/27/2013   NA 140 05/27/2013   K 4.2 05/27/2013   CL 103 05/27/2013   CREATININE 1.0 05/27/2013   BUN 22 05/27/2013   CO2 27 05/27/2013   TSH 2.05 05/27/2013   PSA 1.59 05/27/2013   HGBA1C 6.1 08/04/2012   A complex case       Assessment & Plan:

## 2013-11-23 NOTE — Assessment & Plan Note (Signed)
Labs

## 2013-11-23 NOTE — Assessment & Plan Note (Signed)
Low BP lately. Can try Amlodipine 1/2 tab a day if low BP

## 2013-11-23 NOTE — Assessment & Plan Note (Signed)
On Gabapentin 

## 2013-11-23 NOTE — Assessment & Plan Note (Signed)
Continue with current prescription therapy as reflected on the Med list.  

## 2013-11-23 NOTE — Assessment & Plan Note (Signed)
  Will consult Dr Golden Circle at [place]

## 2013-11-23 NOTE — Assessment & Plan Note (Addendum)
On Lamictal 

## 2013-11-23 NOTE — Patient Instructions (Addendum)
Can try Amlodipine 1/2 tab a day if low BP   We will consult Dr Golden Circle at [place]

## 2013-11-23 NOTE — Progress Notes (Signed)
Pre visit review using our clinic review tool, if applicable. No additional management support is needed unless otherwise documented below in the visit note. 

## 2013-11-23 NOTE — Assessment & Plan Note (Signed)
Chronic arthralgias Dr Lynann Bologna Will consult Dr Golden Circle at [place]

## 2013-11-24 ENCOUNTER — Telehealth: Payer: Self-pay | Admitting: Internal Medicine

## 2013-11-24 NOTE — Telephone Encounter (Signed)
Relevant patient education assigned to patient using Emmi. ° °

## 2013-11-25 ENCOUNTER — Other Ambulatory Visit: Payer: Self-pay | Admitting: Internal Medicine

## 2013-12-13 ENCOUNTER — Encounter (HOSPITAL_COMMUNITY): Payer: Self-pay | Admitting: Psychiatry

## 2013-12-13 ENCOUNTER — Ambulatory Visit (INDEPENDENT_AMBULATORY_CARE_PROVIDER_SITE_OTHER): Payer: Commercial Indemnity | Admitting: Psychiatry

## 2013-12-13 VITALS — BP 142/80 | HR 82 | Ht 72.0 in | Wt 182.4 lb

## 2013-12-13 DIAGNOSIS — F319 Bipolar disorder, unspecified: Secondary | ICD-10-CM

## 2013-12-13 DIAGNOSIS — F329 Major depressive disorder, single episode, unspecified: Secondary | ICD-10-CM

## 2013-12-13 DIAGNOSIS — F39 Unspecified mood [affective] disorder: Secondary | ICD-10-CM

## 2013-12-13 MED ORDER — ZOLPIDEM TARTRATE 5 MG PO TABS: ORAL_TABLET | ORAL | Status: DC

## 2013-12-13 MED ORDER — LAMOTRIGINE ER 300 MG PO TB24: ORAL_TABLET | ORAL | Status: DC

## 2013-12-13 MED ORDER — LORAZEPAM 0.5 MG PO TABS: ORAL_TABLET | ORAL | Status: DC

## 2013-12-13 NOTE — Progress Notes (Signed)
[facility] Progress Note  Alex [name] [id number] 58 y.o.  12/13/2013 2:17 PM  Chief Complaint:  Medication management and followup.      History of Present Illness:  Alex [name] came for his followup appointment.  He is concerned about his family member.  One of his daughter is in rehabilitation in [place] and his son is in the hospital after self inflicting injuries and behavior.  Patient endorsed that he's very stressed out because of his family member .  He's also not happy with this pain specialist because he continues to have back pain.  Patient told his insurance refused to have fusion procedure for his lower back ache .  He endorse he is taking the medication was helping his sleep.  Patient denies any agitation, anger or any mood swing but he sees some time that he needs to see a therapist.  The patient used to see [name] however since his therapist left the practice he has not seen any counselor.  Patient denies any paranoia, hallucination, agitation or any feeling of hopelessness and worthlessness.  He denies any side effects of medication.  He is taking Lamictal, Ativan and Ambien as prescribed.  He's also taking pain medication but he does not feel it is working very well.    Suicidal Ideation: No Plan Formed: No Patient has means to carry out plan: No  Homicidal Ideation: No Plan Formed: No Patient has means to carry out plan: No  Review of Systems: Psychiatric: Agitation: No Hallucination: No Depressed Mood: No Insomnia: Yes Hypersomnia: No Altered Concentration: No Feels Worthless: No Grandiose Ideas: No Belief In Special Powers: No New/Increased Substance Abuse: No Compulsions: No  Neurologic: Headache: Yes Seizure: No Paresthesias: No  Medical  history Patient has history of hypertension , immune deficiency syndrome , GERD , Tick disorder, coronary artery disease, hypertension, COPD, esophagus stricture, lumbago, osteoarthritis,  malignant neoplasm of orbit, vertigo, osteoarthritis and chronic fatigue.   Outpatient Encounter Prescriptions as of 12/13/2013  Medication Sig  . amLODipine (NORVASC) 5 MG tablet Take 1 tablet (5 mg total) by mouth daily. Take at hs  . aspirin EC 81 MG tablet Take 1 tablet (81 mg total) by mouth daily.  [name] atorvastatin (LIPITOR) 40 MG tablet TAKE 1 TABLET BY MOUTH ONCE DAILY  . cholecalciferol (VITAMIN D) 1000 UNITS tablet Take 1 tablet (1,000 Units total) by mouth daily.  . Coenzyme Q10 (CO Q 10 PO) Take by mouth daily.  [name] guaiFENesin (MUCINEX) 600 MG 12 hr tablet Take 600 mg by mouth 2 (two) times daily as needed. For congestion  . hydroxypropyl methylcellulose (ISOPTO TEARS) 2.5 % ophthalmic solution Place 1 drop into both eyes 4 (four) times daily as needed. For dry eyes  . Immune Globulin, Human, (HIZENTRA) 4 GM/20ML SOLN Inject into the skin once a week. wednesday  . LamoTRIgine 300 MG TB24 TAKE 1 TABLET EVERY DAY  . LORazepam (ATIVAN) 0.5 MG tablet TAKE 1 TABLET BY MOUTH TWICE A DAY AS NEEDED FOR ANXIETY  . naproxen (NAPRELAN) 500 MG 24 hr tablet Take 1 tablet (500 mg total) by mouth daily with breakfast.  . OMEGA-3 KRILL OIL PO Take by mouth daily.  . RABEprazole (ACIPHEX) 20 MG tablet TAKE 1 TABLET BY MOUTH EVERY DAY  . SYNTHROID 150 MCG tablet Take 1 tablet (150 mcg total) by mouth daily before breakfast.  . Tamsulosin HCl (FLOMAX) 0.4 MG CAPS Take 0.4 mg by mouth as needed.  [name] telmisartan (MICARDIS) 80 MG tablet  TAKE 1 TABLET (80 MG TOTAL) BY MOUTH DAILY.  [name] Testosterone (AXIRON) 30 MG/ACT SOLN Place 2 Act onto the skin every morning.  [name] Umeclidinium-Vilanterol (ANORO ELLIPTA) 62.5-25 MCG/INH AEPB Inhale 1 Act into the lungs daily.  [name] zolpidem (AMBIEN) 5 MG tablet TAKE 1 TABLET BY MOUTH AT BEDTIME AS NEEDED  . [DISCONTINUED] gabapentin (NEURONTIN) 100 MG capsule Take 1-2 capsules (100-200 mg total) by mouth at bedtime as needed.  . [DISCONTINUED] LamoTRIgine 300 MG TB24 TAKE 1  TABLET EVERY DAY  . [DISCONTINUED] LORazepam (ATIVAN) 0.5 MG tablet TAKE 1 TABLET BY MOUTH TWICE A DAY AS NEEDED FOR ANXIETY  . [DISCONTINUED] zolpidem (AMBIEN) 5 MG tablet TAKE 1 TABLET BY MOUTH AT BEDTIME AS NEEDED    Past Psychiatric History/Hospitalization(s): Patient has been seeing in this office since 2009.  He has history of depression mood swing and anger.  He's been admitted to behavioral [facility] due to suicidal thoughts but he denies any suicidal attempt.  He denies any psychosis but admitted poor impulse control.  In the past he has taken Seroquel and Cymbalta.   Anxiety: Yes Bipolar Disorder: Yes Depression: Yes Mania: No Psychosis: No Schizophrenia: No Personality Disorder: No Hospitalization for psychiatric illness: Yes History of Electroconvulsive Shock Therapy: No Prior Suicide Attempts: No  Physical Exam: Constitutional:  BP 142/80  Pulse 82  Ht 6' (1.829 m)  Wt 182 lb 6.4 oz (82.736 kg)  BMI 24.73 kg/m2  General Appearance: alert, oriented, no acute distress  Musculoskeletal: Strength & Muscle Tone: within normal limits Gait & Station: normal Patient leans: N/A  Review of Systems  Neurological:       Restless   Recent Results (from the past 2160 hour(s))  LIPID PANEL     Status: None   Collection Time    11/22/13 10:05 AM      Result Value Ref Range   Cholesterol 154  0 - 200 mg/dL   Comment: ATP III Classification       Desirable:  < 200 mg/dL               Borderline High:  200 - 239 mg/dL          High:  > = 240 mg/dL   Triglycerides 102.0  0.0 - 149.0 mg/dL   Comment: Normal:  <150 mg/dLBorderline High:  150 - 199 mg/dL   HDL 57.80  >39.00 mg/dL   VLDL 20.4  0.0 - 40.0 mg/dL   LDL Cholesterol 76  0 - 99 mg/dL   Total CHOL/HDL Ratio 3     Comment:                Men          Women1/2 Average Risk     3.4          3.3Average Risk          5.0          4.42X Average Risk          9.6          7.13X Average Risk          15.0          11.0                         Mental status examination Patient is casually dressed and fairly groomed.  He is anxious but maintained a good eye contact.  He described his mood  is nervous and his affect is mood appropriate.  He denies any active or passive suicidal thoughts or homicidal thoughts.  He denies any auditory or visual hallucination.  He is restless but there were no tremors or shakes present.   His thought process is logical linear and goal-directed.  There were no flight of ideas present at this time.  His fund of knowledge is adequate.  His attention and concentration is okay.  He's alert and oriented x3.  His insight judgment and impulse control is okay.   Established Problem, Stable/Improving (1), Review of Psycho-Social Stressors (1), Review of Last Therapy Session (1) and Review of Medication Regimen & Side Effects (2)  Assessment: Axis I:  bipolar disorder1  Axis II:  deferred  Axis III:  see medical history   Patient Active Problem List   Diagnosis Date Noted  . Well adult exam 05/31/2013  . Glaucoma 05/31/2013  . RML pneumonia 03/31/2013  . Fever, unspecified 03/26/2013  . Hypertension, uncontrolled 02/25/2013  . Preop exam for internal medicine 04/02/2012  . Ingrowing toenail with infection 04/02/2012  . Localized osteoarthritis of left knee 04/02/2012  . Vertigo 01/05/2012  . Ataxia 01/05/2012  . ANXIETY 08/08/2010  . COPD 11/30/2009  . ESOPHAGEAL STRICTURE 11/30/2009  . CHANGE IN BOWELS 11/30/2009  . DEPRESSION 08/07/2009  . OSTEOARTHRITIS 08/07/2009  . GENERALIZED OSTEOARTHROSIS UNSPECIFIED SITE 10/20/2008  . HYPERLIPIDEMIA-MIXED 10/19/2008  . FOOT PAIN 07/11/2008  . EPIDYDIMITIS/ORCHITIS 05/24/2008  . FATIGUE 05/14/2008  . RASH-NONVESICULAR 05/14/2008  . Cough 03/29/2008  . CORONARY ATHEROSCLEROSIS 11/04/2007  . Lumbago 11/04/2007  . THYROIDECTOMY, HX OF 11/04/2007  . 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 NEOPLASM, MALIGNANT, THYROID GLAND 10/02/2007   . HYPOGONADISM, MALE 10/02/2007  . 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 IMPAIRED GLUCOSE TOLERANCE 10/02/2007    Axis IV: Mild to moderate  Axis V: 55-65  Plan:  Reassurance given .  I recommended to see a therapist in this office for coping and social skills.  Recommend to continue Lamictal, Ativan and Ambien are present dose. Discussed risk and benefits of medication.  Recommend to call us back if he has any questions or any concern.  Followup in 3 months.   ARFEEN,SYED T., MD 12/13/2013

## 2013-12-16 ENCOUNTER — Ambulatory Visit (INDEPENDENT_AMBULATORY_CARE_PROVIDER_SITE_OTHER): Payer: Commercial Indemnity | Admitting: Psychiatry

## 2013-12-16 DIAGNOSIS — F329 Major depressive disorder, single episode, unspecified: Secondary | ICD-10-CM
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
## 2014-02-21 ENCOUNTER — Encounter: Payer: Self-pay | Admitting: Internal Medicine

## 2014-02-21 ENCOUNTER — Ambulatory Visit (INDEPENDENT_AMBULATORY_CARE_PROVIDER_SITE_OTHER): Payer: Commercial Indemnity | Admitting: Internal Medicine

## 2014-02-21 VITALS — BP 150/100 | HR 80 | Temp 98.2°F | Resp 16 | Wt 182.0 lb

## 2014-02-21 DIAGNOSIS — R609 Edema, unspecified: Secondary | ICD-10-CM | POA: Insufficient documentation

## 2014-02-21 DIAGNOSIS — H9319 Tinnitus, unspecified ear: Secondary | ICD-10-CM

## 2014-02-21 DIAGNOSIS — I2510 Atherosclerotic heart disease of native coronary artery without angina pectoris: Secondary | ICD-10-CM

## 2014-02-21 DIAGNOSIS — F3289 Other specified depressive episodes: Secondary | ICD-10-CM

## 2014-02-21 DIAGNOSIS — H9313 Tinnitus, bilateral: Secondary | ICD-10-CM

## 2014-02-21 DIAGNOSIS — G8929 Other chronic pain: Secondary | ICD-10-CM | POA: Insufficient documentation

## 2014-02-21 DIAGNOSIS — M533 Sacrococcygeal disorders, not elsewhere classified: Secondary | ICD-10-CM

## 2014-02-21 DIAGNOSIS — F411 Generalized anxiety disorder: Secondary | ICD-10-CM

## 2014-02-21 DIAGNOSIS — F329 Major depressive disorder, single episode, unspecified: Secondary | ICD-10-CM

## 2014-02-21 MED ORDER — TRAMADOL HCL 50 MG PO TABS: 50.0000 mg | ORAL_TABLET | Freq: Two times a day (BID) | ORAL | Status: DC | PRN

## 2014-02-21 NOTE — Progress Notes (Signed)
Pre visit review using our clinic review tool, if applicable. No additional management support is needed unless otherwise documented below in the visit note. 

## 2014-02-21 NOTE — Assessment & Plan Note (Signed)
A lot of stress at home

## 2014-02-21 NOTE — Assessment & Plan Note (Signed)
Reduce Naproxen use

## 2014-02-21 NOTE — Assessment & Plan Note (Signed)
Doing fair 

## 2014-02-21 NOTE — Assessment & Plan Note (Signed)
[address] [facility] [address] Lynann Bolog[address] Dr Lovenia Shuck - pain management  Needs Norco - not getting it from other doctors

## 2014-02-21 NOTE — Assessment & Plan Note (Signed)
Continue with current prescription therapy as reflected on the Med list.  

## 2014-02-21 NOTE — Progress Notes (Signed)
Patient ID: Alex [name], male   DOB: Jun 17, 1956, 58 y.o.   MRN: [id number]   Subjective:      F/u stress, dizziness, SI joint pain, pains in other joints C/o anxiety, arthritis and ringing in the ears  Wt Readings from Last 3 Encounters:  02/21/14 182 lb (82.555 kg)  12/13/13 182 lb 6.4 oz (82.736 kg)  11/23/13 182 lb (82.555 kg)   BP Readings from Last 3 Encounters:  02/21/14 150/100  12/13/13 142/80  11/23/13 148/84    Hypertension This is a chronic problem. The current episode started more than 1 year ago. The problem has been rapidly worsening (stressed a lot lately) since onset. Pertinent negatives include no chest pain, neck pain or palpitations.  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  Alex [name] tried to overdose himself several times 6/14, 10/14, 7/15 - self-harm  One dtr has moved in - stress is worse (drugs)    BP Readings from Last 3 Encounters:  02/21/14 150/100  12/13/13 142/80  11/23/13 148/84   Wt Readings from Last 3 Encounters:  02/21/14 182 lb (82.555 kg)  12/13/13 182 lb 6.4 oz (82.736 kg)  11/23/13 182 lb (82.555 kg)   BP 150/100  Pulse 80  Temp(Src) 98.2 F (36.8 C) (Oral)  Resp 16  Wt 182 lb (82.555 kg)  Past Medical History  Diagnosis Date  . Anxiety   . Hypertension   . Hyperlipidemia   . Immune deficiency disorder   . GERD (gastroesophageal reflux disease)   . Glaucoma BOTH EYES -- NO DROPS   . History of thyroid cancer PRIMARY (NO METS FROM ORBITAL CANCER)--   IN REMISSION    S/P TOTAL THYROIDECTOMY  , CHEMORADIATION  (ONCOLOGIST -- DR Griffith Citron)  . 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 Dysuria   . Prostatitis   . Epididymitis, left   . Renal calculi LEFT KIDNEY-- NON-OBSTRUCTIVE  . Depression   . Bipolar I disorder   . Complication of anesthesia POST URINARY RETENTION---  2006 SHOULDER SURGERY MARKED BRADYCARDIA VAGAL RESPONSE NO ISSUE W/ SURGERY AFTER THIS ONE  . BPH (benign prostatic hypertrophy) with urinary obstruction   . Positional vertigo   . Left flank pain   . Coronary atherosclerosis CARDIOLOGIST- DR CRENSHAW--  LAST VISIT 01-05-2012 IN [facility]    NON-OBSTRUCTIVE MILD DISEASE  . Urinary hesitancy   . Left knee pain CHONDRAL FLAP TEAR   Past Surgical History  Procedure Laterality Date  . Occuloplastic surgery  2002  . Total thyroidectomy  11-03-2001    PAPILLARY THYROID CARCINOMA  . Right supraomohyoid neck dissection   03-08-2003    ZONES 1,2,3;   SUBMANDIBULAR MASS / METASTATIC SQUAMOUS CELL CARCINOMA RIGHT NECK  . Pars plana vitrectomy  11-06-2004    RIGHT EYE RADIATION RETINOPATHY W/ HEMORRHAGE  . Left hydrocelectomy  03-29-2005    AND REPAIR LEFT INGUINAL HERNIA W/ MESH  . Nasal endoscopy  08-07-2005  RIGHT EPISTAXIS  / POST SEPTOPLASTY  (HX RIGHT ORBITAL CA & S/P RADIATION/ NECROSIS ANTERIOR END OF BOTH INFERIOR TURBINATES)  . Cardiac catheterization  01-16-2006  DR THOMAS WALL    MILD CORONARY ATHEROSCLEROSIS/ MID TO DISTAL LAD 40% STENOSIS/ LVF 50-55%  . Shoulder arthroscopy w/ subacromial decompression and distal clavicle excision  10-09-2008    AND DEBRIDEMENT OF RIGHT SHOULDER IMPINGEMENT & [facility] JOINT ARTHRITIS  . Right ankle arthroscopy w/ extensive debridement  04-05-2008   . Left ankle arthroscopy w/ debridement  05-12-2007  . Mohs surgery  2002    RIGHT ORBITAL CANCER  . Repair undesended right testicle / right inguinal hernia  AGE 36  . Septoplasty  NOV 2006  . Knee  arthroscopy  05/01/2012    Procedure: ARTHROSCOPY KNEE;  Surgeon: Johnn Hai, MD;  Location: [facility];  Service: Orthopedics;  Laterality: Left;  debridement and removal of loose body    reports that he has never smoked. He has never used smokeless tobacco. He reports that he does not drink alcohol or use illicit drugs. family history includes Depression in his daughter and sister; Drug abuse in his daughter. Allergies  Allergen Reactions  . Percocet [Oxycodone-Acetaminophen] Itching    Can take generic.  States only has a problem with percocet brand     Current Outpatient Prescriptions on File Prior to Visit  Medication Sig Dispense Refill  . amLODipine (NORVASC) 5 MG tablet Take 1 tablet (5 mg total) by mouth daily. Take at hs  90 tablet  3  . aspirin EC 81 MG tablet Take 1 tablet (81 mg total) by mouth daily.      [name] atorvastatin (LIPITOR) 40 MG tablet TAKE 1 TABLET BY MOUTH ONCE DAILY  90 tablet  2  . cholecalciferol (VITAMIN D) 1000 UNITS tablet Take 1 tablet (1,000 Units total) by mouth daily.  100 tablet  3  . Coenzyme Q10 (CO Q 10 PO) Take by mouth daily.      [name] guaiFENesin (MUCINEX) 600 MG 12 hr tablet Take 600 mg by mouth 2 (two) times daily as needed. For congestion      . hydroxypropyl methylcellulose (ISOPTO TEARS) 2.5 % ophthalmic solution Place 1 drop into both eyes 4 (four) times daily as needed. For dry eyes      . Immune Globulin, Human, (HIZENTRA) 4 GM/20ML SOLN Inject into the skin once a week. wednesday      . LamoTRIgine 300 MG TB24 TAKE 1 TABLET EVERY DAY  90 tablet  0  . LORazepam (ATIVAN) 0.5 MG tablet TAKE 1 TABLET BY MOUTH TWICE A DAY AS NEEDED FOR ANXIETY  60 tablet  1  . naproxen (NAPRELAN) 500 MG 24 hr tablet Take 1 tablet (500 mg total) by mouth daily with breakfast.  60 tablet  5  . OMEGA-3 KRILL OIL PO Take by mouth daily.      . RABEprazole (ACIPHEX) 20 MG tablet TAKE 1 TABLET BY MOUTH EVERY DAY  90 tablet  2  . SYNTHROID 150 MCG  tablet Take 1 tablet (150 mcg total) by mouth daily before breakfast.  90 tablet  3  . Tamsulosin HCl (FLOMAX) 0.4 MG CAPS Take 0.4 mg by mouth as needed.      [name] telmisartan (MICARDIS) 80 MG tablet TAKE 1 TABLET (80 MG TOTAL) BY MOUTH DAILY.  90 tablet  3  . Testosterone (AXIRON) 30 MG/ACT SOLN Place 2 Act onto the skin every morning.      [name]  Umeclidinium-Vilanterol (ANORO ELLIPTA) 62.5-25 MCG/INH AEPB Inhale 1 Act into the lungs daily.  1 each  11  . zolpidem (AMBIEN) 5 MG tablet TAKE 1 TABLET BY MOUTH AT BEDTIME AS NEEDED  30 tablet  0   No current facility-administered medications on file prior to visit.     Review of Systems  Constitutional: Negative for appetite change, fatigue and unexpected weight change.  HENT: Negative for congestion, nosebleeds, sneezing, sore throat and trouble swallowing.   Eyes: Negative for itching and visual disturbance.  Respiratory: Negative for cough.   Cardiovascular: Negative for chest pain, palpitations and leg swelling.  Gastrointestinal: Negative for nausea, diarrhea, blood in stool and abdominal distention.  Genitourinary: Negative for frequency and hematuria.  Musculoskeletal: Negative for back pain, gait problem, joint swelling and neck pain.  Skin: Negative for rash.  Neurological: Negative for dizziness, tremors, speech difficulty and weakness.  Psychiatric/Behavioral: Negative for suicidal ideas, sleep disturbance, dysphoric mood and agitation. The patient is nervous/anxious.    Prostate was checkked by Dr D.    Objective:   Physical Exam  Constitutional: He is oriented to person, place, and time. He appears well-developed. No distress.  HENT:  Mouth/Throat: Oropharynx is clear and moist.  Eyes: Conjunctivae are normal. Pupils are equal, round, and reactive to light.  Neck: Normal range of motion. No JVD present. No thyromegaly present.  Cardiovascular: Normal rate, regular rhythm, normal heart sounds and intact distal pulses.  Exam  reveals no gallop and no friction rub.   No murmur heard. Pulmonary/Chest: Effort normal and breath sounds normal. No respiratory distress. He has no wheezes. He has no rales. He exhibits no tenderness.  Abdominal: Soft. Bowel sounds are normal. He exhibits no distension and no mass. There is no tenderness. There is no rebound and no guarding.  Genitourinary: Rectum normal and prostate normal. Guaiac negative stool.  Testes nl  Musculoskeletal: Normal range of motion. He exhibits no edema and no tenderness.  Lymphadenopathy:    He has no cervical adenopathy.  Neurological: He is alert and oriented to person, place, and time. He has normal reflexes. No cranial nerve deficit. He exhibits normal muscle tone. Coordination normal.  Skin: Skin is warm and dry. No rash noted.  Psychiatric: He has a normal mood and affect. His behavior is normal. Judgment and thought content normal.  anxious  Gingivitis Caries R elbow is in a brace SI tender B  Lab Results  Component Value Date   WBC 7.1 05/27/2013   HGB 15.3 05/27/2013   HCT 45.7 05/27/2013   PLT 235.0 05/27/2013   GLUCOSE 97 05/27/2013   CHOL 154 11/22/2013   TRIG 102.0 11/22/2013   HDL 57.80 11/22/2013   LDLDIRECT 134.7 03/05/2007   LDLCALC 76 11/22/2013   ALT 40 05/27/2013   AST 30 05/27/2013   NA 140 05/27/2013   K 4.2 05/27/2013   CL 103 05/27/2013   CREATININE 1.0 05/27/2013   BUN 22 05/27/2013   CO2 27 05/27/2013   TSH 2.05 05/27/2013   PSA 1.59 05/27/2013   HGBA1C 6.1 08/04/2012   A complex case       Assessment & Plan:

## 2014-02-21 NOTE — Assessment & Plan Note (Signed)
Reduce Naproxen use Will try Tramadol

## 2014-02-21 NOTE — Patient Instructions (Signed)
Stop Naproxen

## 2014-02-25 ENCOUNTER — Ambulatory Visit (HOSPITAL_COMMUNITY): Payer: Self-pay | Admitting: Psychiatry

## 2014-03-03 ENCOUNTER — Other Ambulatory Visit (HOSPITAL_COMMUNITY): Payer: Self-pay | Admitting: *Deleted

## 2014-03-03 DIAGNOSIS — F329 Major depressive disorder, single episode, unspecified: Secondary | ICD-10-CM

## 2014-03-03 MED ORDER — LORAZEPAM 0.5 MG PO TABS: ORAL_TABLET | ORAL | Status: DC

## 2014-03-09 ENCOUNTER — Telehealth: Payer: Self-pay | Admitting: Internal Medicine

## 2014-03-09 NOTE — Telephone Encounter (Signed)
Pt called stated that he drop off his lab result last week and was wondering what Dr. Camila Li wants to do about his low blood count. Please advise, pt is concern. Please call pt

## 2014-03-10 ENCOUNTER — Ambulatory Visit (HOSPITAL_COMMUNITY): Payer: Self-pay | Admitting: Psychiatry

## 2014-03-14 NOTE — Telephone Encounter (Signed)
I don't have it Thx 

## 2014-03-14 NOTE — Telephone Encounter (Signed)
Pt will fax a copy to our office today.

## 2014-03-15 ENCOUNTER — Encounter (HOSPITAL_COMMUNITY): Payer: Self-pay | Admitting: Psychiatry

## 2014-03-15 ENCOUNTER — Telehealth: Payer: Self-pay | Admitting: *Deleted

## 2014-03-15 ENCOUNTER — Other Ambulatory Visit: Payer: Self-pay | Admitting: Internal Medicine

## 2014-03-15 ENCOUNTER — Ambulatory Visit (INDEPENDENT_AMBULATORY_CARE_PROVIDER_SITE_OTHER): Payer: Commercial Indemnity | Admitting: Psychiatry

## 2014-03-15 VITALS — BP 107/73 | HR 97 | Ht 72.0 in | Wt 182.6 lb

## 2014-03-15 DIAGNOSIS — F319 Bipolar disorder, unspecified: Secondary | ICD-10-CM

## 2014-03-15 DIAGNOSIS — F39 Unspecified mood [affective] disorder: Secondary | ICD-10-CM

## 2014-03-15 MED ORDER — LAMOTRIGINE ER 300 MG PO TB24: ORAL_TABLET | ORAL | Status: DC

## 2014-03-15 MED ORDER — ZOLPIDEM TARTRATE 5 MG PO TABS: ORAL_TABLET | ORAL | Status: DC

## 2014-03-15 NOTE — Telephone Encounter (Signed)
Pt informed of MD's advisement on low Hgb. MD wants Iron and TIBC ordered. Pt requests a written order for labs. Order upfront for pt to p/u.

## 2014-03-15 NOTE — Progress Notes (Signed)
Alex [name] [phone] Progress Note  Alex [name] [id number] 58 y.o.  03/15/2014 2:25 PM  Chief Complaint:  Medication management and followup.      History of Present Illness:  Alex [name] came for his followup appointment.  He is taking his medication without any side effects.  His daughter recently finished rehabilitation program in [name] and his son [facility] .  Both are living with him.  Patient is sleeping better with Ambien.  He denies any irritability, anger, mood swing.  He feels his medicine is working very well.  He does not want to change his medication.  He is hoping to appeal to get fusion procedure for his lower back.  He told that his pain specialist promised to help him.  Patient is taking Ativan, Lamictal and Ambien.  Denies any rash or itching.  Denies any paranoia or any hallucinations he is seeing Alex [name] for counseling and he liked to keep appointment for counseling.  His appetite is good.  His vitals are stable.  Suicidal Ideation: No Plan Formed: No Patient has means to carry out plan: No  Homicidal Ideation: No Plan Formed: No Patient has means to carry out plan: No  Review of Systems: Psychiatric: Agitation: No Hallucination: No Depressed Mood: No Insomnia: No Hypersomnia: No Altered Concentration: No Feels Worthless: No Grandiose Ideas: No Belief In Special Powers: No New/Increased Substance Abuse: No Compulsions: No  Neurologic: Headache: Yes Seizure: No Paresthesias: No  Medical  history Patient has history of hypertension , immune deficiency syndrome , GERD , Tick disorder, coronary artery disease, hypertension, COPD, esophagus stricture, lumbago, osteoarthritis, malignant neoplasm of orbit, vertigo, osteoarthritis and chronic fatigue.   Outpatient Encounter Prescriptions as of 03/15/2014  Medication Sig  . amLODipine (NORVASC) 5 MG tablet Take 1 tablet (5 mg total) by mouth daily. Take at hs  .  aspirin EC 81 MG tablet Take 1 tablet (81 mg total) by mouth daily.  [name] atorvastatin (LIPITOR) 40 MG tablet TAKE 1 TABLET BY MOUTH ONCE DAILY  . cholecalciferol (VITAMIN D) 1000 UNITS tablet Take 1 tablet (1,000 Units total) by mouth daily.  . Coenzyme Q10 (CO Q 10 PO) Take by mouth daily.  [name] guaiFENesin (MUCINEX) 600 MG 12 hr tablet Take 600 mg by mouth 2 (two) times daily as needed. For congestion  . hydroxypropyl methylcellulose (ISOPTO TEARS) 2.5 % ophthalmic solution Place 1 drop into both eyes 4 (four) times daily as needed. For dry eyes  . Immune Globulin, Human, (HIZENTRA) 4 GM/20ML SOLN Inject into the skin once a week. wednesday  . LamoTRIgine 300 MG TB24 TAKE 1 TABLET EVERY DAY  . LORazepam (ATIVAN) 0.5 MG tablet TAKE 1 TABLET BY MOUTH TWICE A DAY AS NEEDED FOR ANXIETY  . naproxen (NAPRELAN) 500 MG 24 hr tablet Take 1 tablet (500 mg total) by mouth daily with breakfast.  . OMEGA-3 KRILL OIL PO Take by mouth daily.  . RABEprazole (ACIPHEX) 20 MG tablet TAKE 1 TABLET BY MOUTH EVERY DAY  . SYNTHROID 150 MCG tablet Take 1 tablet (150 mcg total) by mouth daily before breakfast.  . Tamsulosin HCl (FLOMAX) 0.4 MG CAPS Take 0.4 mg by mouth as needed.  [name] telmisartan (MICARDIS) 80 MG tablet TAKE 1 TABLET (80 MG TOTAL) BY MOUTH DAILY.  [name] Testosterone (AXIRON) 30 MG/ACT SOLN Place 2 Act onto the skin every morning.  . traMADol (ULTRAM) 50 MG tablet Take 1-2 tablets (50-100 mg total) by mouth 2 (two) times daily as  needed.  [name] Umeclidinium-Vilanterol (ANORO ELLIPTA) 62.5-25 MCG/INH AEPB Inhale 1 Act into the lungs daily.  [name] zolpidem (AMBIEN) 5 MG tablet TAKE 1 TABLET BY MOUTH AT BEDTIME AS NEEDED  . [DISCONTINUED] LamoTRIgine 300 MG TB24 TAKE 1 TABLET EVERY DAY  . [DISCONTINUED] zolpidem (AMBIEN) 5 MG tablet TAKE 1 TABLET BY MOUTH AT BEDTIME AS NEEDED    Past Psychiatric History/Hospitalization(s): Patient has been seeing in this office since 2009.  He has history of depression mood swing and  anger.  He's been admitted to behavioral [facility] due to suicidal thoughts but he denies any suicidal attempt.  He denies any psychosis but admitted poor impulse control.  In the past he has taken Seroquel and Cymbalta.   Anxiety: Yes Bipolar Disorder: Yes Depression: Yes Mania: No Psychosis: No Schizophrenia: No Personality Disorder: No Hospitalization for psychiatric illness: Yes History of Electroconvulsive Shock Therapy: No Prior Suicide Attempts: No  Physical Exam: Constitutional:  BP 107/73  Pulse 97  Ht 6' (1.829 m)  Wt 182 lb 9.6 oz (82.827 kg)  BMI 24.76 kg/m2  General Appearance: alert, oriented, no acute distress  Musculoskeletal: Strength & Muscle Tone: within normal limits Gait & Station: normal Patient leans: N/A  Review of Systems  Neurological:       Restless   No results found for this or any previous visit (from the past 2160 hour(s)).  Mental status examination Patient is casually dressed and fairly groomed.  He is anxious but maintained a good eye contact.  He described his mood is euthymic and his affect is mood appropriate.  He denies any active or passive suicidal thoughts or homicidal thoughts.  He denies any auditory or visual hallucination.  He is restless but there were no tremors or shakes present.   His thought process is logical linear and goal-directed.  There were no flight of ideas present at this time.  His fund of knowledge is adequate.  His attention and concentration is okay.  He's alert and oriented x3.  His insight judgment and impulse control is okay.   Established Problem, Stable/Improving (1), Review of Psycho-Social Stressors (1), Review of Last Therapy Session (1) and Review of Medication Regimen & Side Effects (2)  Assessment: Axis I:  bipolar disorder1  Axis II:  deferred  Axis III:  see medical history   Patient Active Problem List   Diagnosis Date Noted  . Chronic right SI joint pain 02/21/2014  . Edema 02/21/2014   . Tinnitus 02/21/2014  . Well adult exam 05/31/2013  . Glaucoma 05/31/2013  . RML pneumonia 03/31/2013  . Fever, unspecified 03/26/2013  . Hypertension, uncontrolled 02/25/2013  . Preop exam for internal medicine 04/02/2012  . Ingrowing toenail with infection 04/02/2012  . Localized osteoarthritis of left knee 04/02/2012  . Vertigo 01/05/2012  . Ataxia 01/05/2012  . ANXIETY 08/08/2010  . COPD 11/30/2009  . ESOPHAGEAL STRICTURE 11/30/2009  . CHANGE IN BOWELS 11/30/2009  . DEPRESSION 08/07/2009  . OSTEOARTHRITIS 08/07/2009  . GENERALIZED OSTEOARTHROSIS UNSPECIFIED SITE 10/20/2008  . HYPERLIPIDEMIA-MIXED 10/19/2008  . FOOT PAIN 07/11/2008  . EPIDYDIMITIS/ORCHITIS 05/24/2008  . FATIGUE 05/14/2008  . RASH-NONVESICULAR 05/14/2008  . Cough 03/29/2008  . CORONARY ATHEROSCLEROSIS 11/04/2007  . Lumbago 11/04/2007  . THYROIDECTOMY, HX OF 11/04/2007  . 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 NEOPLASM, MALIGNANT, THYROID GLAND 10/02/2007  . HYPOGONADISM, MALE 10/02/2007  . 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 IMPAIRED GLUCOSE TOLERANCE 10/02/2007    Axis IV: Mild to moderate  Axis V: 55-65  Plan:  Patient is doing better on his current psychotropic medication.  He has no rash or itching.  I will continue little 300 mg daily, Ambien 10 mg half to 1 tablet as needed for insomnia and Ativan 1 mg twice a day as needed.  Recommended to keep appointment with Alex [name] for counseling.  Recommend to call us back if he has any questions or any concern.  Followup in 3 months.   Leonila Speranza T., MD 03/15/2014

## 2014-03-16 LAB — IRON AND TIBC
%SAT: 3 % — ABNORMAL LOW (ref 20–55)
Iron: 13 ug/dL — ABNORMAL LOW (ref 42–165)
TIBC: 475 ug/dL — ABNORMAL HIGH (ref 215–435)
UIBC: 462 ug/dL — ABNORMAL HIGH (ref 125–400)

## 2014-03-18 ENCOUNTER — Telehealth: Payer: Self-pay | Admitting: Internal Medicine

## 2014-03-18 NOTE — Telephone Encounter (Signed)
Pt called request lab result that was done 03/15/14. Pt was wondering what Dr. Camila Li would like to do next.

## 2014-03-22 ENCOUNTER — Other Ambulatory Visit: Payer: Self-pay | Admitting: Internal Medicine

## 2014-03-22 MED ORDER — FERROUS SULFATE 325 (65 FE) MG PO TABS: 325.0000 mg | ORAL_TABLET | Freq: Every day | ORAL | Status: DC

## 2014-03-22 NOTE — Telephone Encounter (Signed)
Pt call check up on this lab result. Please let pt know if he need to get them to send it over again if we do not have it. Please call pt

## 2014-03-22 NOTE — Telephone Encounter (Signed)
Please advise 

## 2014-03-22 NOTE — Telephone Encounter (Signed)
Replied via MyChart Thx

## 2014-03-25 ENCOUNTER — Ambulatory Visit (HOSPITAL_COMMUNITY): Payer: Self-pay | Admitting: Psychiatry

## 2014-04-01 ENCOUNTER — Other Ambulatory Visit (HOSPITAL_COMMUNITY): Payer: Self-pay | Admitting: Psychiatry

## 2014-04-05 ENCOUNTER — Encounter: Payer: Self-pay | Admitting: Cardiology

## 2014-04-05 ENCOUNTER — Ambulatory Visit (INDEPENDENT_AMBULATORY_CARE_PROVIDER_SITE_OTHER): Payer: Commercial Indemnity | Admitting: Cardiology

## 2014-04-05 VITALS — BP 156/110 | HR 70 | Ht 73.0 in | Wt 187.0 lb

## 2014-04-05 DIAGNOSIS — I2510 Atherosclerotic heart disease of native coronary artery without angina pectoris: Secondary | ICD-10-CM

## 2014-04-05 DIAGNOSIS — I10 Essential (primary) hypertension: Secondary | ICD-10-CM

## 2014-04-05 DIAGNOSIS — Z01818 Encounter for other preprocedural examination: Secondary | ICD-10-CM

## 2014-04-05 DIAGNOSIS — E785 Hyperlipidemia, unspecified: Secondary | ICD-10-CM

## 2014-04-05 NOTE — Progress Notes (Signed)
04/07/2014   PCP: Walker Kehr, MD   Chief Complaint  Patient presents with  . Follow-up    surgical clearance    Primary Cardiologist: Dr. Thresa Ross   HPI:  58 year old white [name] here today for surgical clearance for elbow surgery planned block for anesthesia with a hx of CAD, HTN, HL, GERD, prior thyroid cancer and orbital cancer,  bipolar disorder, DJD, CVID (common variable immunodeficiency). LHC 6/07 mid LAD 40%, EF 50-55%. ETT 7/11: Exercised for 10 minutes, normal study. Echo 12/12: EF 56-38%, grade 1 diastolic dysfunction. Last seen by Dr. Stanford Breed 12/12 and Richardson Dopp 12/13.  He denies any chest pain or shortness of breath he feels quite well is able to do any activity except for problems with his hip. He had planned to have surgery on the hip but his insurance coverage would not cover it.  His blood pressure today is elevated we discussed resuming amlodipine he would like to attempt a prostate medication in the past at lower his blood pressure and he would prefer to use that medication. We will try that you'll call us or have his behavioral health call if his blood pressures. We discussed with the blood pressure as high as it is today most likely he would not be allowed to have surgery that day.     Allergies  Allergen Reactions  . Percocet [Oxycodone-Acetaminophen] Itching    Can take generic.  States only has a problem with percocet brand    Current Outpatient Prescriptions  Medication Sig Dispense Refill  . aspirin EC 81 MG tablet Take 1 tablet (81 mg total) by mouth daily.      [name] atorvastatin (LIPITOR) 40 MG tablet TAKE 1 TABLET BY MOUTH ONCE DAILY  90 tablet  2  . cholecalciferol (VITAMIN D) 1000 UNITS tablet Take 1 tablet (1,000 Units total) by mouth daily.  100 tablet  3  . Coenzyme Q10 (CO Q 10 PO) Take by mouth daily.      . ferrous sulfate 325 (65 FE) MG tablet Take 1 tablet (325 mg total) by mouth daily.  30 tablet  3  . guaiFENesin  (MUCINEX) 600 MG 12 hr tablet Take 600 mg by mouth 2 (two) times daily as needed. For congestion      . hydroxypropyl methylcellulose (ISOPTO TEARS) 2.5 % ophthalmic solution Place 1 drop into both eyes 4 (four) times daily as needed. For dry eyes      . Immune Globulin, Human, (HIZENTRA) 4 GM/20ML SOLN Inject into the skin once a week. wednesday      . LamoTRIgine 300 MG TB24 TAKE 1 TABLET EVERY DAY  90 tablet  0  . LORazepam (ATIVAN) 0.5 MG tablet TAKE 1 TABLET BY MOUTH TWICE DAILY AS NEEDED FOR ANXIETY  60 tablet  0  . naproxen (NAPRELAN) 500 MG 24 hr tablet Take 1 tablet (500 mg total) by mouth daily with breakfast.  60 tablet  5  . OMEGA-3 KRILL OIL PO Take by mouth daily.      . RABEprazole (ACIPHEX) 20 MG tablet TAKE 1 TABLET BY MOUTH EVERY DAY  90 tablet  2  . SYNTHROID 150 MCG tablet Take 1 tablet (150 mcg total) by mouth daily before breakfast.  90 tablet  3  . telmisartan (MICARDIS) 80 MG tablet TAKE 1 TABLET (80 MG TOTAL) BY MOUTH DAILY.  90 tablet  3  . Testosterone (AXIRON) 30 MG/ACT SOLN Place 2 Act onto the  skin every morning.      . traMADol (ULTRAM) 50 MG tablet Take 1-2 tablets (50-100 mg total) by mouth 2 (two) times daily as needed.  120 tablet  1  . zolpidem (AMBIEN) 5 MG tablet TAKE 1 TABLET BY MOUTH AT BEDTIME AS NEEDED  30 tablet  0   No current facility-administered medications for this visit.    Past Medical History  Diagnosis Date  . Anxiety   . Hypertension   . Hyperlipidemia   . Immune deficiency disorder   . GERD (gastroesophageal reflux disease)   . Glaucoma BOTH EYES -- NO DROPS   . History of thyroid cancer PRIMARY (NO METS FROM ORBITAL CANCER)--   IN REMISSION    S/P TOTAL THYROIDECTOMY  , CHEMORADIATION  (ONCOLOGIST -- DR Griffith Citron)  . 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 Dysuria   .  Prostatitis   . Epididymitis, left   . Renal calculi LEFT KIDNEY-- NON-OBSTRUCTIVE  . Depression   . Bipolar I disorder   . Complication of anesthesia POST URINARY RETENTION---  2006 SHOULDER SURGERY MARKED BRADYCARDIA VAGAL RESPONSE NO ISSUE W/ SURGERY AFTER THIS ONE  . BPH (benign prostatic hypertrophy) with urinary obstruction   . Positional vertigo   . Left flank pain   . Coronary atherosclerosis CARDIOLOGIST- DR CRENSHAW--  LAST VISIT 01-05-2012 IN EPIC    NON-OBSTRUCTIVE MILD DISEASE  . Urinary hesitancy   . Left knee pain CHONDRAL FLAP TEAR    Past Surgical History  Procedure Laterality Date  . Occuloplastic surgery  2002  . Total thyroidectomy  11-03-2001    PAPILLARY THYROID CARCINOMA  . Right supraomohyoid neck dissection   03-08-2003    ZONES 1,2,3;   SUBMANDIBULAR MASS / METASTATIC SQUAMOUS CELL CARCINOMA RIGHT NECK  . Pars plana vitrectomy  11-06-2004    RIGHT EYE RADIATION RETINOPATHY W/ HEMORRHAGE  . Left hydrocelectomy  03-29-2005    AND REPAIR LEFT INGUINAL HERNIA W/ MESH  . Nasal endoscopy  08-07-2005    RIGHT EPISTAXIS  / POST SEPTOPLASTY  (HX RIGHT ORBITAL CA & S/P RADIATION/ NECROSIS ANTERIOR END OF BOTH INFERIOR TURBINATES)  . Cardiac catheterization  01-16-2006  DR THOMAS WALL    MILD CORONARY ATHEROSCLEROSIS/ MID TO DISTAL LAD 40% STENOSIS/ LVF 50-55%  . Shoulder arthroscopy w/ subacromial decompression and distal clavicle excision  10-09-2008    AND DEBRIDEMENT OF RIGHT SHOULDER IMPINGEMENT & [facility] JOINT ARTHRITIS  . Right ankle arthroscopy w/ extensive debridement  04-05-2008   . Left ankle arthroscopy w/ debridement  05-12-2007  . Mohs surgery  2002    RIGHT ORBITAL CANCER  . Repair undesended right testicle / right inguinal hernia  AGE 50  . Septoplasty  NOV 2006  . Knee arthroscopy  05/01/2012    Procedure: ARTHROSCOPY KNEE;  Surgeon: Johnn Hai, MD;  Location: [facility];  Service: Orthopedics;  Laterality: Left;  debridement and  removal of loose body    YQM:VHQIONG:EX colds or fevers, mild weight changes Skin:no rashes or ulcers HEENT:no blurred vision, no congestion CV:see HPI PUL:see HPI GI:no diarrhea constipation or melena, no indigestion GU:no hematuria, no dysuria MS:+ joint pain hip, elbow, no claudication Neuro:no syncope, no lightheadedness Endo:no diabetes, + thyroid disease  Wt Readings from Last 3  Encounters:  04/05/14 187 lb (84.823 kg)  03/15/14 182 lb 9.6 oz (82.827 kg)  02/21/14 182 lb (82.555 kg)    PHYSICAL EXAM BP 156/110  Pulse 70  Ht 6\' 1"  (1.854 m)  Wt 187 lb (84.823 kg)  BMI 24.68 kg/m2 General:Pleasant affect, NAD Skin:Warm and dry, brisk capillary refill HEENT:normocephalic, sclera clear, mucus membranes moist Neck:supple, no JVD, no bruits  Heart:S1S2 RRR without murmur, gallup, rub or click Lungs:clear without rales, rhonchi, or wheezes SLP:NPYY, non tender, + BS, do not palpate liver spleen or masses Ext:no lower ext edema, 2+ pedal pulses, 2+ radial pulses Neuro:alert and oriented, MAE, follows commands, + facial symmetry EKG:SR possible Lt atrial enlargement no changes from 07/2012   ASSESSMENT AND PLAN Pre-operative exam Need for elbow surgery with plans for arm block, patient with class 1-2 functional capacity secondary to inability to walk fast due to hip pain but able to do ADLs without any chest pain or chest pain at all. Low risk for surgery. Once his blood pressure is controlled medications will be adjusted.  CORONARY ATHEROSCLEROSIS Nonobstructive coronary disease with cardiac cath in 2007 last stress test 2011 negative for ischemia at that time he exercised for 10 minutes since then he has developed hip problems and has trouble walking long distances. He is grade 1 diastolic dysfunction.  Hypertension, uncontrolled Blood pressure uncontrolled he has trouble with amlodipine as it causes erectile dysfunction. He would prefer to use prostate medication in the  past has lowered his blood pressure. I agreed to try that he'll have his blood pressure checked to behavioral health. If it is not lower his blood pressure he knows to call us that with his current blood pressure would not be recommended to have surgery until it was better controlled.  HYPERLIPIDEMIA-MIXED Lipid Panel     Component Value Date/Time   CHOL 154 11/22/2013 1005   TRIG 102.0 11/22/2013 1005   HDL 57.80 11/22/2013 1005   CHOLHDL 3 11/22/2013 1005   VLDL 20.4 11/22/2013 1005   LDLCALC 76 11/22/2013 1005   On Lipirtor 40   Follow with Dr. Stanford Breed in 6 months

## 2014-04-05 NOTE — Patient Instructions (Signed)
Call us with the med you are using for your prostate   Alex [name] is sending a clearance letter for you  Your physician recommends that you schedule a follow-up appointment in: 6 months with Dr. Stanford Breed

## 2014-04-06 ENCOUNTER — Ambulatory Visit: Payer: Self-pay | Admitting: Internal Medicine

## 2014-04-07 ENCOUNTER — Ambulatory Visit (INDEPENDENT_AMBULATORY_CARE_PROVIDER_SITE_OTHER): Payer: Commercial Indemnity | Admitting: Psychiatry

## 2014-04-07 VITALS — BP 135/92 | HR 73

## 2014-04-07 DIAGNOSIS — Z01818 Encounter for other preprocedural examination: Secondary | ICD-10-CM | POA: Insufficient documentation

## 2014-04-07 DIAGNOSIS — F329 Major depressive disorder, single episode, unspecified: Secondary | ICD-10-CM

## 2014-04-07 DIAGNOSIS — F39 Unspecified mood [affective] disorder: Secondary | ICD-10-CM

## 2014-04-07 NOTE — Assessment & Plan Note (Signed)
Nonobstructive coronary disease with cardiac cath in 2007 last stress test 2011 negative for ischemia at that time he exercised for 10 minutes since then he has developed hip problems and has trouble walking long distances. He is grade 1 diastolic dysfunction.

## 2014-04-07 NOTE — Assessment & Plan Note (Addendum)
Need for elbow surgery with plans for arm block, patient with class 1-2 functional capacity secondary to inability to walk fast due to hip pain but able to do ADLs without any chest pain or chest pain at all. Low risk for surgery. Once his blood pressure is controlled medications will be adjusted.

## 2014-04-07 NOTE — Assessment & Plan Note (Signed)
Lipid Panel     Component Value Date/Time   CHOL 154 11/22/2013 1005   TRIG 102.0 11/22/2013 1005   HDL 57.80 11/22/2013 1005   CHOLHDL 3 11/22/2013 1005   VLDL 20.4 11/22/2013 1005   LDLCALC 76 11/22/2013 1005   On Lipirtor 40

## 2014-04-07 NOTE — Assessment & Plan Note (Signed)
Blood pressure uncontrolled he has trouble with amlodipine as it causes erectile dysfunction. He would prefer to use prostate medication in the past has lowered his blood pressure. I agreed to try that he'll have his blood pressure checked to behavioral health. If it is not lower his blood pressure he knows to call us that with his current blood pressure would not be recommended to have surgery until it was better controlled.

## 2014-04-08 NOTE — Progress Notes (Signed)
   THERAPIST PROGRESS NOTE Session Time: 1:00-2:00   Participation Level: Active   Behavioral Response: Well GroomedAlertAnxious and Depressed   Type of Therapy: Individual Therapy   Treatment Goals addressed: Coping   Interventions: CBT, Motivational Interviewing, Strength-based, Supportive and Reframing   Summary: Alex [name] is a 58 y.o. male who continues to present with depressed mood and anxious affect. Pt. Reports that he has experienced multiple losses including death of his dog, loss of access to nature area because it was bought by a private entity, and feeling that he continues to lose his health because of shoulder and back conditions. Pt. Reports that his daughter his home from long-term treatment facility but continues to engage in drug seeking behavior including stealing and selling his medication. Pt. Reports that his daughter is a constant drain on his emotional and financial resources, but she cannot afford to leave and he and his wife cannot afford to continue to support her. Pt. Reports that his son is also back living with him [facility] and that he is in therapy but family continues to be fearful that they will be able to keep him safe at home. Pt. Reports that his wife is not emotionally supportive and communicates with him in a way that shames him for his having physical and emotional pain. Pt. Reports that his back pain has been intolerable in the last few months but is hopeful that new physical therapist will be able to help him. Pt. Expressed openness to use of yoga as a complementary therapy for his severe back pain.  Suicidal/Homicidal: Nowithout intent/plan   Therapist Response: Assessed patients current functioning and reviewed progress. Assisted patient with the expression of frustration, supportive due to number of significant losses.  Recommend daily exercise, increased socialization and recreation. Encouraged patient to  verbalize alternative and factual responses which challenge thought distortions.   Plan: Continue with CBT based therapy. Return again in two weeks.   Diagnosis: Axis I: Bipolar, Depressed   Axis II: No diagnosis     Renford Dills 04/08/2014

## 2014-04-22 ENCOUNTER — Ambulatory Visit (HOSPITAL_COMMUNITY): Payer: Self-pay | Admitting: Psychiatry

## 2014-04-22 ENCOUNTER — Encounter (HOSPITAL_BASED_OUTPATIENT_CLINIC_OR_DEPARTMENT_OTHER): Payer: Self-pay | Admitting: *Deleted

## 2014-04-22 NOTE — Progress Notes (Signed)
NPO AFTER MN WITH EXCEPTION CLEAR LIQUIDS UNTIL 0800 (NO CREAM/ MILK PRODUCTS).  ARRIVE AT 6734. NEEDS ISTAT. CURRENT EKG IN CHART AND EPIC. WILL TAKE SYNTHROID AND MICARDIS AM DOS W/ SIPS AND IF NEEDED MAY TAKE TRAMADOL.  DR DAHLSTEDT IS PT'S UROLOGIST, HE IS CONCERNED ABOUT POST RETENTION.

## 2014-04-22 NOTE — Progress Notes (Signed)
04/22/14 1200  OBSTRUCTIVE SLEEP APNEA  Have you ever been diagnosed with sleep apnea through a sleep study? No  Do you snore loudly (loud enough to be heard through closed doors)?  1  Do you often feel tired, fatigued, or sleepy during the daytime? 0  Has anyone observed you stop breathing during your sleep? 0  Do you have, or are you being treated for high blood pressure? 1  BMI more than 35 kg/m2? 0  Age over 60 years old? 1  Neck circumference greater than 40 cm/16 inches? 1  Gender: 1  Obstructive Sleep Apnea Score 5  Score 4 or greater  Results sent to PCP

## 2014-04-24 ENCOUNTER — Other Ambulatory Visit (HOSPITAL_COMMUNITY): Payer: Self-pay | Admitting: Psychiatry

## 2014-04-28 ENCOUNTER — Encounter (HOSPITAL_BASED_OUTPATIENT_CLINIC_OR_DEPARTMENT_OTHER): Payer: Managed Care, Other (non HMO) | Admitting: Anesthesiology

## 2014-04-28 ENCOUNTER — Ambulatory Visit (HOSPITAL_BASED_OUTPATIENT_CLINIC_OR_DEPARTMENT_OTHER)
Admission: RE | Admit: 2014-04-28 | Discharge: 2014-04-28 | Disposition: A | Discharge status: Home / Self Care | Payer: Managed Care, Other (non HMO) | Source: Ambulatory Visit | Attending: Orthopedic Surgery | Admitting: Orthopedic Surgery

## 2014-04-28 ENCOUNTER — Encounter (HOSPITAL_BASED_OUTPATIENT_CLINIC_OR_DEPARTMENT_OTHER)
Admission: RE | Disposition: A | Discharge status: Home / Self Care | Payer: Self-pay | Source: Ambulatory Visit | Attending: Orthopedic Surgery

## 2014-04-28 ENCOUNTER — Ambulatory Visit (HOSPITAL_BASED_OUTPATIENT_CLINIC_OR_DEPARTMENT_OTHER): Payer: Managed Care, Other (non HMO) | Admitting: Anesthesiology

## 2014-04-28 ENCOUNTER — Encounter (HOSPITAL_BASED_OUTPATIENT_CLINIC_OR_DEPARTMENT_OTHER): Payer: Self-pay | Admitting: *Deleted

## 2014-04-28 DIAGNOSIS — G5620 Lesion of ulnar nerve, unspecified upper limb: Secondary | ICD-10-CM | POA: Diagnosis not present

## 2014-04-28 DIAGNOSIS — K219 Gastro-esophageal reflux disease without esophagitis: Secondary | ICD-10-CM | POA: Insufficient documentation

## 2014-04-28 DIAGNOSIS — Z85850 Personal history of malignant neoplasm of thyroid: Secondary | ICD-10-CM | POA: Diagnosis not present

## 2014-04-28 DIAGNOSIS — N200 Calculus of kidney: Secondary | ICD-10-CM | POA: Insufficient documentation

## 2014-04-28 DIAGNOSIS — I10 Essential (primary) hypertension: Secondary | ICD-10-CM | POA: Diagnosis not present

## 2014-04-28 DIAGNOSIS — N401 Enlarged prostate with lower urinary tract symptoms: Secondary | ICD-10-CM | POA: Insufficient documentation

## 2014-04-28 DIAGNOSIS — E785 Hyperlipidemia, unspecified: Secondary | ICD-10-CM | POA: Diagnosis not present

## 2014-04-28 DIAGNOSIS — F319 Bipolar disorder, unspecified: Secondary | ICD-10-CM | POA: Diagnosis not present

## 2014-04-28 DIAGNOSIS — N138 Other obstructive and reflux uropathy: Secondary | ICD-10-CM | POA: Diagnosis not present

## 2014-04-28 DIAGNOSIS — Z85840 Personal history of malignant neoplasm of eye: Secondary | ICD-10-CM | POA: Insufficient documentation

## 2014-04-28 DIAGNOSIS — G5621 Lesion of ulnar nerve, right upper limb: Secondary | ICD-10-CM

## 2014-04-28 DIAGNOSIS — F411 Generalized anxiety disorder: Secondary | ICD-10-CM | POA: Diagnosis not present

## 2014-04-28 DIAGNOSIS — M7700 Medial epicondylitis, unspecified elbow: Secondary | ICD-10-CM | POA: Diagnosis present

## 2014-04-28 HISTORY — DX: Dorsalgia, unspecified: M54.9

## 2014-04-28 HISTORY — DX: Reserved for concepts with insufficient information to code with codable children: IMO0002

## 2014-04-28 HISTORY — PX: ULNAR NERVE TRANSPOSITION: SHX2595

## 2014-04-28 HISTORY — DX: Lesion of ulnar nerve, unspecified upper limb: G56.20

## 2014-04-28 HISTORY — DX: Unspecified osteoarthritis, unspecified site: M19.90

## 2014-04-28 HISTORY — DX: Nocturia: R35.1

## 2014-04-28 HISTORY — DX: Other chronic pain: G89.29

## 2014-04-28 HISTORY — DX: Personal history of other diseases of male genital organs: Z87.438

## 2014-04-28 LAB — POCT I-STAT, CHEM 8
BUN: 17 mg/dL (ref 6–23)
Calcium, Ion: 1.16 mmol/L (ref 1.13–1.30)
Chloride: 103 mEq/L (ref 96–112)
Creatinine, Ser: 0.9 mg/dL (ref 0.50–1.35)
Glucose, Bld: 100 mg/dL — ABNORMAL HIGH (ref 70–99)
HCT: 43 % (ref 39.0–52.0)
Hemoglobin: 14.6 g/dL (ref 13.0–17.0)
Potassium: 4.2 mEq/L (ref 3.7–5.3)
Sodium: 138 mEq/L (ref 137–147)
TCO2: 24 mmol/L (ref 0–100)

## 2014-04-28 SURGERY — ULNAR NERVE DECOMPRESSION/TRANSPOSITION
Anesthesia: General | Site: Elbow | Laterality: Right

## 2014-04-28 MED ORDER — CHLORHEXIDINE GLUCONATE 4 % EX LIQD
60.0000 mL | Freq: Once | CUTANEOUS | Status: DC
  Filled 2014-04-28: qty 60

## 2014-04-28 MED ORDER — FENTANYL CITRATE 0.05 MG/ML IJ SOLN
INTRAMUSCULAR | Status: DC | PRN
  Administered 2014-04-28: 50 ug via INTRAVENOUS
  Administered 2014-04-28: 25 ug via INTRAVENOUS

## 2014-04-28 MED ORDER — KETOROLAC TROMETHAMINE 30 MG/ML IJ SOLN
INTRAMUSCULAR | Status: DC | PRN
  Administered 2014-04-28: 30 mg via INTRAVENOUS

## 2014-04-28 MED ORDER — DEXAMETHASONE SODIUM PHOSPHATE 4 MG/ML IJ SOLN
INTRAMUSCULAR | Status: DC | PRN
  Administered 2014-04-28: 10 mg via INTRAVENOUS

## 2014-04-28 MED ORDER — BUPIVACAINE HCL (PF) 0.25 % IJ SOLN
INTRAMUSCULAR | Status: DC | PRN
  Administered 2014-04-28: 10 mL

## 2014-04-28 MED ORDER — CEFAZOLIN SODIUM-DEXTROSE 2-3 GM-% IV SOLR
2.0000 g | INTRAVENOUS | Status: DC
  Filled 2014-04-28: qty 50

## 2014-04-28 MED ORDER — HYDROMORPHONE HCL 1 MG/ML IJ SOLN
INTRAMUSCULAR | Status: AC
  Filled 2014-04-28: qty 1

## 2014-04-28 MED ORDER — FENTANYL CITRATE 0.05 MG/ML IJ SOLN
INTRAMUSCULAR | Status: AC
  Filled 2014-04-28: qty 4

## 2014-04-28 MED ORDER — MIDAZOLAM HCL 2 MG/2ML IJ SOLN
INTRAMUSCULAR | Status: AC
  Filled 2014-04-28: qty 2

## 2014-04-28 MED ORDER — HYDROMORPHONE HCL 1 MG/ML IJ SOLN
0.2500 mg | INTRAMUSCULAR | Status: DC | PRN
  Administered 2014-04-28 (×4): 0.25 mg via INTRAVENOUS
  Filled 2014-04-28: qty 1

## 2014-04-28 MED ORDER — SODIUM CHLORIDE 0.9 % IR SOLN
Status: DC | PRN
  Administered 2014-04-28: 500 mL

## 2014-04-28 MED ORDER — OXYCODONE HCL 5 MG PO TABS
5.0000 mg | ORAL_TABLET | ORAL | Status: DC | PRN
Start: 2014-04-28 — End: 2014-06-29

## 2014-04-28 MED ORDER — MEPERIDINE HCL 25 MG/ML IJ SOLN
6.2500 mg | INTRAMUSCULAR | Status: DC | PRN
  Filled 2014-04-28: qty 1

## 2014-04-28 MED ORDER — EPHEDRINE SULFATE 50 MG/ML IJ SOLN
INTRAMUSCULAR | Status: DC | PRN
  Administered 2014-04-28 (×3): 10 mg via INTRAVENOUS

## 2014-04-28 MED ORDER — OXYCODONE HCL 5 MG PO TABS
5.0000 mg | ORAL_TABLET | ORAL | Status: DC | PRN
  Administered 2014-04-28: 5 mg via ORAL
  Filled 2014-04-28: qty 1

## 2014-04-28 MED ORDER — PROMETHAZINE HCL 25 MG/ML IJ SOLN
6.2500 mg | INTRAMUSCULAR | Status: DC | PRN
  Filled 2014-04-28: qty 1

## 2014-04-28 MED ORDER — LACTATED RINGERS IV SOLN
INTRAVENOUS | Status: DC
  Administered 2014-04-28 (×2): via INTRAVENOUS
  Filled 2014-04-28: qty 1000

## 2014-04-28 MED ORDER — CEFAZOLIN SODIUM-DEXTROSE 2-3 GM-% IV SOLR
INTRAVENOUS | Status: AC
  Filled 2014-04-28: qty 50

## 2014-04-28 MED ORDER — OXYCODONE HCL 5 MG PO TABS
ORAL_TABLET | ORAL | Status: AC
  Filled 2014-04-28: qty 1

## 2014-04-28 MED ORDER — MIDAZOLAM HCL 5 MG/5ML IJ SOLN
INTRAMUSCULAR | Status: DC | PRN
  Administered 2014-04-28: 2 mg via INTRAVENOUS

## 2014-04-28 MED ORDER — PROPOFOL 10 MG/ML IV BOLUS
INTRAVENOUS | Status: DC | PRN
  Administered 2014-04-28: 200 mg via INTRAVENOUS

## 2014-04-28 MED ORDER — ACETAMINOPHEN 10 MG/ML IV SOLN
INTRAVENOUS | Status: DC | PRN
  Administered 2014-04-28: 1000 mg via INTRAVENOUS

## 2014-04-28 MED ORDER — LIDOCAINE HCL (CARDIAC) 20 MG/ML IV SOLN
INTRAVENOUS | Status: DC | PRN
  Administered 2014-04-28: 50 mg via INTRAVENOUS

## 2014-04-28 MED ORDER — CEFAZOLIN SODIUM-DEXTROSE 2-3 GM-% IV SOLR
2.0000 g | INTRAVENOUS | Status: AC
  Administered 2014-04-28: 2 g via INTRAVENOUS
  Filled 2014-04-28: qty 50

## 2014-04-28 MED ORDER — ONDANSETRON HCL 4 MG/2ML IJ SOLN
INTRAMUSCULAR | Status: DC | PRN
  Administered 2014-04-28: 4 mg via INTRAVENOUS

## 2014-04-28 SURGICAL SUPPLY — 58 items
BAG URINE LEG 19OZ MD ST LTX (BAG) ×2 IMPLANT
BANDAGE ELASTIC 3 VELCRO ST LF (GAUZE/BANDAGES/DRESSINGS) ×2 IMPLANT
BANDAGE ELASTIC 4 VELCRO ST LF (GAUZE/BANDAGES/DRESSINGS) ×2 IMPLANT
BENZOIN TINCTURE PRP APPL 2/3 (GAUZE/BANDAGES/DRESSINGS) ×4 IMPLANT
BLADE SURG 15 STRL LF DISP TIS (BLADE) ×1 IMPLANT
BLADE SURG 15 STRL SS (BLADE) ×1
BNDG ESMARK 4X9 LF (GAUZE/BANDAGES/DRESSINGS) ×2 IMPLANT
BNDG GAUZE ELAST 4 BULKY (GAUZE/BANDAGES/DRESSINGS) ×2 IMPLANT
CATH FOLEY 2WAY SLVR  5CC 16FR (CATHETERS) ×1
CATH FOLEY 2WAY SLVR 5CC 16FR (CATHETERS) ×1 IMPLANT
CORDS BIPOLAR (ELECTRODE) ×2 IMPLANT
COVER MAYO STAND STRL (DRAPES) ×2 IMPLANT
COVER TABLE BACK 60X90 (DRAPES) ×2 IMPLANT
CUFF TOURNIQUET SINGLE 18IN (TOURNIQUET CUFF) ×2 IMPLANT
DRAIN PENROSE 18X1/4 LTX STRL (WOUND CARE) ×2 IMPLANT
DRAPE EXTREMITY T 121X128X90 (DRAPE) ×2 IMPLANT
DRAPE LG THREE QUARTER DISP (DRAPES) ×4 IMPLANT
DRAPE SURG 17X23 STRL (DRAPES) ×2 IMPLANT
DRAPE U-SHAPE 47X51 STRL (DRAPES) IMPLANT
DRSG EMULSION OIL 3X3 NADH (GAUZE/BANDAGES/DRESSINGS) IMPLANT
GAUZE XEROFORM 1X8 LF (GAUZE/BANDAGES/DRESSINGS) ×2 IMPLANT
GLOVE BIOGEL PI IND STRL 8.5 (GLOVE) ×1 IMPLANT
GLOVE BIOGEL PI INDICATOR 8.5 (GLOVE) ×1
GLOVE SURG ORTHO 8.0 STRL STRW (GLOVE) ×2 IMPLANT
GOWN STRL REUS W/ TWL LRG LVL3 (GOWN DISPOSABLE) ×1 IMPLANT
GOWN STRL REUS W/ TWL XL LVL3 (GOWN DISPOSABLE) ×2 IMPLANT
GOWN STRL REUS W/TWL LRG LVL3 (GOWN DISPOSABLE) ×1
GOWN STRL REUS W/TWL XL LVL3 (GOWN DISPOSABLE) ×2
LOOP VESSEL MAXI BLUE (MISCELLANEOUS) IMPLANT
LOOP VESSEL MINI RED (MISCELLANEOUS) IMPLANT
NEEDLE HYPO 25X1 1.5 SAFETY (NEEDLE) ×2 IMPLANT
NS IRRIG 500ML POUR BTL (IV SOLUTION) ×2 IMPLANT
PACK BASIN DAY SURGERY FS (CUSTOM PROCEDURE TRAY) ×2 IMPLANT
PAD CAST 4YDX4 CTTN HI CHSV (CAST SUPPLIES) IMPLANT
PADDING CAST ABS 4INX4YD NS (CAST SUPPLIES) ×1
PADDING CAST ABS COTTON 4X4 ST (CAST SUPPLIES) ×1 IMPLANT
PADDING CAST COTTON 4X4 STRL (CAST SUPPLIES)
SPLINT FIBERGLASS 3X35 (CAST SUPPLIES) ×2 IMPLANT
SPLINT FIBERGLASS 4X30 (CAST SUPPLIES) IMPLANT
SPONGE GAUZE 4X4 12PLY (GAUZE/BANDAGES/DRESSINGS) IMPLANT
SPONGE GAUZE 4X4 12PLY STER LF (GAUZE/BANDAGES/DRESSINGS) ×2 IMPLANT
STOCKINETTE 4X48 STRL (DRAPES) ×2 IMPLANT
STRIP CLOSURE SKIN 1/2X4 (GAUZE/BANDAGES/DRESSINGS) IMPLANT
SUCTION FRAZIER TIP 10 FR DISP (SUCTIONS) IMPLANT
SUT ETHIBOND 3-0 V-5 (SUTURE) IMPLANT
SUT ETHILON 4 0 PS 2 18 (SUTURE) ×2 IMPLANT
SUT MERSILENE 4 0 P 3 (SUTURE) IMPLANT
SUT MNCRL AB 3-0 PS2 18 (SUTURE) ×2 IMPLANT
SUT PROLENE 4 0 PS 2 18 (SUTURE) ×2 IMPLANT
SUT VIC AB 2-0 SH 27 (SUTURE) ×2
SUT VIC AB 2-0 SH 27XBRD (SUTURE) ×2 IMPLANT
SUT VICRYL 4-0 PS2 18IN ABS (SUTURE) ×2 IMPLANT
SYR BULB 3OZ (MISCELLANEOUS) ×2 IMPLANT
SYR CONTROL 10ML LL (SYRINGE) ×2 IMPLANT
TAPE STRIPS DRAPE STRL (GAUZE/BANDAGES/DRESSINGS) ×2 IMPLANT
TOWEL OR 17X24 6PK STRL BLUE (TOWEL DISPOSABLE) ×4 IMPLANT
TUBE CONNECTING 12X1/4 (SUCTIONS) IMPLANT
UNDERPAD 30X30 INCONTINENT (UNDERPADS AND DIAPERS) ×2 IMPLANT

## 2014-04-28 NOTE — Anesthesia Postprocedure Evaluation (Signed)
Anesthesia Post Note  Patient: Alex [name]  Procedure(s) Performed: Procedure(s) (LRB): RIGHT ELBOW ULNA NERVE RELEASE TRANSPOSTION AND MEDIAL EPICONDYLAR DEBRIDEMENT AND REPAIR (Right)  Anesthesia type: General  Patient location: PACU  Post pain: Pain level controlled  Post assessment: Post-op Vital signs reviewed  Last Vitals: BP 144/88  Pulse 78  Temp(Src) 36.6 C (Oral)  Resp 17  Wt 181 lb 8 oz (82.328 kg)  SpO2 100%  Post vital signs: Reviewed  Level of consciousness: sedated  Complications: No apparent anesthesia complications

## 2014-04-28 NOTE — Brief Op Note (Signed)
04/28/2014  2:07 PM  PATIENT:  Alex [name]  58 y.o. male  PRE-OPERATIVE DIAGNOSIS:  RIGHT ELBOW ULNA NERVE COMPRESSION AND MEDIAL EPICONDYLITIS  POST-OPERATIVE DIAGNOSIS:  * No post-op diagnosis entered *  PROCEDURE:  Procedure(s): RIGHT ELBOW ULNA NERVE RELEASE TRANSPOSTION AND MEDIAL EPICONDYLAR DEBRIDEMENT AND REPAIR (Right)  SURGEON:  Surgeon(s) and Role:    * Linna Hoff, MD - Primary  PHYSICIAN ASSISTANT:   ASSISTANTS: none   ANESTHESIA:   general  EBL:     BLOOD ADMINISTERED:none  DRAINS: Urinary Catheter (Foley)   LOCAL MEDICATIONS USED:  MARCAINE     SPECIMEN:  No Specimen  DISPOSITION OF SPECIMEN:  N/A  COUNTS:  YES  TOURNIQUET:    DICTATION: .Other Dictation: Dictation Number [id number]  PLAN OF CARE: Discharge to home after PACU  PATIENT DISPOSITION:  PACU - hemodynamically stable.   Delay start of Pharmacological VTE agent (>24hrs) due to surgical blood loss or risk of bleeding: not applicable

## 2014-04-28 NOTE — Discharge Instructions (Signed)
KEEP BANDAGE CLEAN AND DRY CALL OFFICE FOR F/U APPT [phone] IN 2 WEEKS DR [facility] CELL PHONE [phone] KEEP HAND ELEVATED ABOVE HEART OK TO APPLY ICE TO OPERATIVE AREA CONTACT OFFICE IF ANY WORSENING PAIN OR CONCERNS. Post Anesthesia Home Care Instructions  Activity: Get plenty of rest for the remainder of the day. A responsible adult should stay with you for 24 hours following the procedure.  For the next 24 hours, DO NOT: -Drive a car -Paediatric nurse -Drink alcoholic beverages -Take any medication unless instructed by your physician -Make any legal decisions or sign important papers.  Meals: Start with liquid foods such as gelatin or soup. Progress to regular foods as tolerated. Avoid greasy, spicy, heavy foods. If nausea and/or vomiting occur, drink only clear liquids until the nausea and/or vomiting subsides. Call your physician if vomiting continues.  Special Instructions/Symptoms: Your throat may feel dry or sore from the anesthesia or the breathing tube placed in your throat during surgery. If this causes discomfort, gargle with warm salt water. The discomfort should disappear within 24 hours.       HAND SURGERY    HOME CARE INSTRUCTIONS    The following instructions have been prepared to help you care for yourself upon your return home today.  Wound Care:  Keep your hand elevated above the level of your heart. Do not allow it to dangle by your side. Keep the dressing dry and do not remove it unless your doctor advises you to do so. He will usually change it at the time of you post-op visit. Moving your fingers is advised to stimulate circulation but will depend on the site of your surgery. Of course, if you have a splint applied your doctor will advise you about movement.  Activity:  Do not drive or operate machinery today. Rest today and then you may return to your normal activity and work as indicated by your physician.  Diet: Drink liquids today or eat a light diet.  You may resume a regular diet tomorrow.  General expectations: Pain for two or three days. Fingers may become slightly swollen.   Unexpected Observations- Call your doctor if any of these occur: Severe pain not relieved by pain medication. Elevated temperature. Dressing soaked with blood. Inability to move fingers. White or bluish color to fingers.

## 2014-04-28 NOTE — Anesthesia Preprocedure Evaluation (Addendum)
Anesthesia Evaluation  Patient identified by MRN, date of birth, ID band Patient awake    Reviewed: Allergy & Precautions, H&P , NPO status , Patient's Chart, lab work & pertinent test results, Unable to perform ROS - Chart review only  History of Anesthesia Complications Negative for: history of anesthetic complications  Airway Mallampati: II TM Distance: >3 FB Neck ROM: Full    Dental no notable dental hx.    Pulmonary pneumonia -, COPD breath sounds clear to auscultation  Pulmonary exam normal       Cardiovascular hypertension, Pt. on medications + CAD Rhythm:Regular Rate:Normal     Neuro/Psych PSYCHIATRIC DISORDERS Anxiety Depression Bipolar Disorder negative neurological ROS     GI/Hepatic Neg liver ROS, GERD-  Medicated,  Endo/Other  negative endocrine ROS  Renal/GU Renal disease     Musculoskeletal  (+) Arthritis -,   Abdominal   Peds  Hematology negative hematology ROS (+)   Anesthesia Other Findings   Reproductive/Obstetrics                          Anesthesia Physical  Anesthesia Plan  ASA: III  Anesthesia Plan: General   Post-op Pain Management:    Induction: Intravenous  Airway Management Planned: LMA  Additional Equipment:   Intra-op Plan:   Post-operative Plan: Extubation in OR  Informed Consent: I have reviewed the patients History and Physical, chart, labs and discussed the procedure including the risks, benefits and alternatives for the proposed anesthesia with the patient or authorized representative who has indicated his/her understanding and acceptance.   Dental advisory given  Plan Discussed with: CRNA  Anesthesia Plan Comments:        Anesthesia Quick Evaluation

## 2014-04-28 NOTE — H&P (Signed)
Alex [name] is an 58 y.o. male.   Chief Complaint: right medial elbow pain HPI: pt followed in the office Pt with closed right proximal radius fracture that has gone on to malunion Pt with persistent medial elbow pain and now here for surgery on medial aspect of right elbow No prior surgery to right elbow  Past Medical History  Diagnosis Date  . Anxiety   . Hypertension   . Hyperlipidemia   . Immune deficiency disorder     COMMON VARIABLE  . GERD (gastroesophageal reflux disease)   . Glaucoma BOTH EYES -- NO DROPS   . History of thyroid cancer PRIMARY (NO METS FROM ORBITAL CANCER)--   IN REMISSION    S/P TOTAL THYROIDECTOMY  , CHEMORADIATION  (ONCOLOGIST -- DR Griffith Citron)  . 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 Renal calculi LEFT KIDNEY-- NON-OBSTRUCTIVE  . Depression   . Bipolar I disorder   . Complication of anesthesia POST URINARY RETENTION---  2006 SHOULDER SURGERY MARKED BRADYCARDIA VAGAL RESPONSE NO ISSUE W/ SURGERY AFTER THIS ONE  . BPH (benign prostatic hypertrophy) with urinary obstruction   . Positional vertigo   . Coronary atherosclerosis CARDIOLOGIST- DR CRENSHAW--  LAST VISIT 01-05-2012 IN [facility]    NON-OBSTRUCTIVE MILD DISEASE  . Urinary hesitancy   . Epicondylitis     right elbow  . Ulnar nerve compression     right elbow  . Chronic back pain   . OA (osteoarthritis)   . Nocturia   . History of chronic prostatitis   . At risk for sleep apnea     STOP-BANG= 5    SENT TO PCP 04-22-2014    Past Surgical History  Procedure Laterality Date  . Occuloplastic surgery  2002  . Total thyroidectomy  11-03-2001    PAPILLARY THYROID CARCINOMA  . Right supraomohyoid neck dissection   03-08-2003    ZONES 1,2,3;   SUBMANDIBULAR MASS / METASTATIC SQUAMOUS CELL CARCINOMA RIGHT NECK  . Pars plana vitrectomy   11-06-2004    RIGHT EYE RADIATION RETINOPATHY W/ HEMORRHAGE  . Left hydrocelectomy  03-29-2005    AND REPAIR LEFT INGUINAL HERNIA W/ MESH  . Nasal endoscopy  08-07-2005    RIGHT EPISTAXIS  / POST SEPTOPLASTY  (HX RIGHT ORBITAL CA & S/P RADIATION/ NECROSIS ANTERIOR END OF BOTH INFERIOR TURBINATES)  . Cardiac catheterization  01-16-2006  DR THOMAS WALL    MILD CORONARY ATHEROSCLEROSIS/ MID TO DISTAL LAD 40% STENOSIS/ LVF 50-55%  . Shoulder arthroscopy w/ subacromial decompression and distal clavicle excision  10-09-2008    AND DEBRIDEMENT OF RIGHT SHOULDER IMPINGEMENT & [facility] JOINT ARTHRITIS  . Right ankle arthroscopy w/ extensive debridement  04-05-2008   . Left ankle arthroscopy w/ debridement  05-12-2007  . Mohs surgery  2002    RIGHT ORBITAL CANCER  . Repair undesended right testicle / right inguinal hernia  AGE 70  . Septoplasty  NOV 2006  . Knee arthroscopy  05/01/2012    Procedure: ARTHROSCOPY KNEE;  Surgeon: Johnn Hai, MD;  Location: [facility];  Service: Orthopedics;  Laterality: Left;  debridement and removal of loose body  . Transthoracic echocardiogram  12/ 2012    grade I diastolic dysfunction/ ef 09-73%    Family History  Problem Relation  Age of Onset  . Depression Sister   . Depression Daughter   . Drug abuse Daughter    Social History:  reports that he has never smoked. He has never used smokeless tobacco. He reports that he does not drink alcohol or use illicit drugs.  Allergies:  Allergies  Allergen Reactions  . Percocet [Oxycodone-Acetaminophen] Itching    Can take generic.  States only has a problem with percocet brand    Medications Prior to Admission  Medication Sig Dispense Refill  . aspirin EC 81 MG tablet Take 81 mg by mouth daily.      [name] atorvastatin (LIPITOR) 40 MG tablet TAKE 1 TABLET BY MOUTH ONCE DAILY-  takes in pm      . Coenzyme Q10 (CO Q 10 PO) Take by mouth daily.      . ferrous sulfate 325 (65 FE) MG tablet Take 1 tablet  (325 mg total) by mouth daily.  30 tablet  3  . guaiFENesin (MUCINEX) 600 MG 12 hr tablet Take 600 mg by mouth 2 (two) times daily as needed. For congestion      . hydroxypropyl methylcellulose (ISOPTO TEARS) 2.5 % ophthalmic solution Place 1 drop into both eyes 4 (four) times daily as needed. For dry eyes      . Immune Globulin, Human, (HIZENTRA) 4 GM/20ML SOLN Inject into the skin once a week. wednesday      . LamoTRIgine 300 MG TB24 Take 1 tablet by mouth every morning. TAKE 1 TABLET EVERY DAY      . levothyroxine (SYNTHROID) 150 MCG tablet Take 150 mcg by mouth every evening.      [name] LORazepam (ATIVAN) 0.5 MG tablet TAKE 1 TABLET BY MOUTH TWICE DAILY AS NEEDED FOR ANXIETY  60 tablet  0  . multivitamin-lutein (OCUVITE-LUTEIN) CAPS capsule Take 1 capsule by mouth daily.      . naproxen (NAPRELAN) 500 MG 24 hr tablet Take 1 tablet (500 mg total) by mouth daily with breakfast.  60 tablet  5  . OMEGA-3 KRILL OIL PO Take by mouth daily.      . RABEprazole (ACIPHEX) 20 MG tablet TAKE 1 TABLET BY MOUTH EVERY DAY--  takes in pm      . telmisartan (MICARDIS) 80 MG tablet TAKE 1 TABLET (80 MG TOTAL) BY MOUTH DAILY.--   takes in am      . Testosterone (AXIRON) 30 MG/ACT SOLN Place 2 Act onto the skin every morning.      . traMADol (ULTRAM) 50 MG tablet Take 1-2 tablets (50-100 mg total) by mouth 2 (two) times daily as needed.  120 tablet  1  . zolpidem (AMBIEN) 5 MG tablet TAKE 1 TABLET BY MOUTH AT BEDTIME AS NEEDED  30 tablet  0  . cholecalciferol (VITAMIN D) 1000 UNITS tablet Take 1 tablet (1,000 Units total) by mouth daily.  100 tablet  3    No results found for this or any previous visit (from the past 48 hour(s)). No results found.  ROS NO RECENT ILLNESSES OR HOSPITALIZATIONS  Blood pressure 152/94, pulse 85, temperature 98.7 F (37.1 C), temperature source Oral, resp. rate 16, weight 82.328 kg (181 lb 8 oz), SpO2 100.00%. Physical Exam  General Appearance:  Alert, cooperative, no distress,  appears stated age  Head:  Normocephalic, without obvious abnormality, atraumatic  Eyes:  Pupils equal, conjunctiva/corneas clear,         Throat: Lips, mucosa, and tongue normal; teeth and gums normal  Neck: No visible masses  Lungs:   respirations unlabored  Chest Wall:  No tenderness or deformity  Heart:  Regular rate and rhythm,  Abdomen:   Soft, non-tender,         Extremities: RIGHT ELBOW: TTP OVER MEDIAL EPICONDYLE MILD SUBLUXATION OF NERVE ULNAR ANTERIORLY FINGERS WARM WELL PERFUSED GOOD DIGITAL MOTION AND HAND INTRINSIC STRENGTH.  Pulses: 2+ and symmetric  Skin: Skin color, texture, turgor normal, no rashes or lesions     Neurologic: Normal    Assessment/Plan RIGHT ELBOW MEDIAL EPICONDYLITIS RIGHT ELBOW ULNAR NERVE COMPRESSION  RIGHT ELBOW ULNAR NERVE DECOMPRESSION AND POSSIBLE ANTERIOR TRANSPOSITION AND MEDIAL EPICONDYLAR DEBRIDEMENT  R/B/A DISCUSSED WITH PT IN OFFICE.  PT VOICED UNDERSTANDING OF PLAN CONSENT SIGNED DAY OF SURGERY PT SEEN AND EXAMINED PRIOR TO OPERATIVE PROCEDURE/DAY OF SURGERY SITE MARKED. QUESTIONS ANSWERED WILL GO HOME FOLLOWING SURGERY WE ARE PLANNING SURGERY FOR YOUR UPPER EXTREMITY. THE RISKS AND BENEFITS OF SURGERY INCLUDE BUT NOT LIMITED TO BLEEDING INFECTION, DAMAGE TO NEARBY NERVES ARTERIES TENDONS, FAILURE OF SURGERY TO ACCOMPLISH ITS INTENDED GOALS, PERSISTENT SYMPTOMS AND NEED FOR FURTHER SURGICAL INTERVENTION. WITH THIS IN MIND WE WILL PROCEED. I HAVE DISCUSSED WITH THE PATIENT THE PRE AND POSTOPERATIVE REGIMEN AND THE DOS AND DON'TS. PT VOICED UNDERSTANDING AND INFORMED CONSENT SIGNED. Linna Hoff 04/28/2014, 2:04 PM

## 2014-04-28 NOTE — Anesthesia Procedure Notes (Addendum)
Performed by: Bethena Roys T   Procedure Name: LMA Insertion Date/Time: 04/28/2014 2:24 PM Performed by: Bethena Roys T Pre-anesthesia Checklist: Patient identified, Emergency Drugs available, Suction available and Patient being monitored Patient Re-evaluated:Patient Re-evaluated prior to inductionOxygen Delivery Method: Circle System Utilized Preoxygenation: Pre-oxygenation with 100% oxygen Intubation Type: IV induction Ventilation: Mask ventilation without difficulty LMA: LMA inserted LMA Size: 4.0 Number of attempts: 1 Airway Equipment and Method: bite block Placement Confirmation: positive ETCO2 Tube secured with: Tape Dental Injury: Teeth and Oropharynx as per pre-operative assessment

## 2014-04-28 NOTE — Transfer of Care (Signed)
Immediate Anesthesia Transfer of Care Note  Patient: Alex [name]  Procedure(s) Performed: Procedure(s): RIGHT ELBOW ULNA NERVE RELEASE TRANSPOSTION AND MEDIAL EPICONDYLAR DEBRIDEMENT AND REPAIR (Right)  Patient Location: PACU  Anesthesia Type:General  Level of Consciousness: awake and oriented  Airway & Oxygen Therapy: Patient Spontanous Breathing and Patient connected to nasal cannula oxygen  Post-op Assessment: Report given to PACU RN  Post vital signs: Reviewed and stable  Complications: No apparent anesthesia complications

## 2014-04-29 ENCOUNTER — Encounter (HOSPITAL_BASED_OUTPATIENT_CLINIC_OR_DEPARTMENT_OTHER): Payer: Self-pay | Admitting: Orthopedic Surgery

## 2014-04-29 NOTE — Op Note (Signed)
Alex [name], Alex [name]              ACCOUNT NO.:  [id number]  MEDICAL RECORD NO.:  [id number]  LOCATION:                                 FACILITY:  PHYSICIAN:  Melrose Nakayama, MD  DATE OF BIRTH:  08-10-1956  DATE OF PROCEDURE:  04/28/2014 DATE OF DISCHARGE:  04/28/2014                              OPERATIVE REPORT   PREOPERATIVE DIAGNOSES: 1. Right elbow medial epicondylitis. 2. Right elbow ulnar nerve compression.  POSTOPERATIVE DIAGNOSES: 1. Right elbow medial epicondylitis. 2. Right elbow ulnar nerve compression.  ATTENDING PHYSICIAN:  Linna Hoff IV, MD, who was scrubbed and present for the entire procedure.  ASSISTANT SURGEON:  None.  ANESTHESIA:  General via LMA.  SURGICAL PROCEDURES: 1. Right elbow ulnar nerve decompression. 2. Right elbow flexor pronator lengthening. 3. Right elbow medial epicondylar debridement of soft tissue tendon     and bone.  SURGICAL INDICATIONS:  Mr. Woessner is a 58 year old right-hand-dominant gentleman with chromic median elbow pain.  This has been resistant to nonsurgical treatment, and the patient elected to undergo the above procedure.  Risks, benefits, and alternatives were discussed in detail with the patient.  Signed informed consent was obtained.  Risks include, but not limited to bleeding, infection, damage to nearby nerves, arteries, or tendons, loss of motion of wrist and digits, incomplete relief of symptoms, and need for further surgical intervention.  DESCRIPTION OF PROCEDURE:  The patient was properly identified in the preop holding area and marked with a permanent marker made on the right elbow to indicate correct operative site.  The patient was then brought back to the operating room and placed supine on the anesthesia table. General anesthesia was administered.  The patient tolerated this well. A well-padded tourniquet was then placed on the right brachium and sealed with 1000 drape.  Right upper extremity was  then prepped and draped in normal sterile fashion.  Time-out was called, the correct side was identified, and procedure was then begun.  Attention was then turned to the right elbow where curvilinear incision was made directly posterior and medially and the limb had been elevated and tourniquet insufflated.  Dissection was carried down through the skin and subcutaneous tissues.  The crossing branch of the medial and brachial cutaneous nerve was then carefully protected.  The deep dissection carried all the way down to the ulnar nerve and the nerve was then identified proximally.  It was then released proximally greater than 15 cm proximal, medial epicondyle.  Portion of medial intermuscular septum was then released with the bipolar cautery. With a good hemostasis, released the medial intermuscular septum.  The nerve was then traced through the cubital tunnel through [facility] ligament and FCU fascia where the nerve was then released in its entirety.  After flexion-extension of the ulnar nerve after release, there was a subluxation anteriorly and appeared to be compression along the medial epicondyle, therefore it was decided to transpose the nerve.  A small Penrose then was applied around the nerve.  The nerves were then carefully released both proximally and distally being able to move the nerve anteriorly.  The nerve was then placed back into the cubital tunnel where attention was then  turned to the medial epicondyle debridement.  The fascia layer was then incised exposing the conjoined tendon where the tendon was then carefully released off the bone and debridement of the medial epicondyle was then done with small curettes and rongeurs debriding and removing the degenerative tissue making sure to protect the ulnar collateral ligament complex.  Did not appear to be any insufficiency in ulnar collateral ligament complex.  After debridement of the bone and soft tissue, 2 x 2 cm flexor  pronator flap was then elevated and the fascia sling was created, remaining portion the flexor pronator was lengthened in a Z- fashion lengthening the flexor pronator to allow for transposition of the nerve.  The nerve was then able to transpose anteriorly.  The fascial sling was then sewn to the subcutaneous tissues with a 2-0 Vicryl suture and the nerve sat nicely in the transposed position.  The wound was then thoroughly irrigated.  After the flexor pronator lengthening and the fascial sling creation, the wound was irrigated. The tourniquet deflated.  Hemostasis obtained with bipolar cautery.  The subcutaneous tissues closed with 3-0 Monocryl suture.  Skin closed with a running 4-0 subcuticular Prolene.  Benzoin and Steri-Strips were applied.  Sterile compressive bandage then applied.  The patient tolerated the procedure well.  He was placed in a long-arm splint, extubated, and taken to recovery room in good condition.  POSTPROCEDURE PLAN:  The patient is discharged home, to be seen back in the office in approximately 2 weeks for wound check, suture removal, and begin a postoperative ulnar nerve anterior transposition protocol.     Melrose Nakayama, MD     FWO/MEDQ  D:  04/28/2014  T:  04/29/2014  Job:  [id number]

## 2014-05-05 ENCOUNTER — Other Ambulatory Visit (HOSPITAL_COMMUNITY): Payer: Self-pay | Admitting: Psychiatry

## 2014-05-09 ENCOUNTER — Ambulatory Visit (HOSPITAL_COMMUNITY): Payer: Self-pay | Admitting: Psychiatry

## 2014-05-19 ENCOUNTER — Telehealth: Payer: Self-pay | Admitting: *Deleted

## 2014-05-19 NOTE — Telephone Encounter (Signed)
Clearance for a S.I. Joint fusion faxed back

## 2014-06-02 ENCOUNTER — Other Ambulatory Visit (HOSPITAL_COMMUNITY): Payer: Self-pay | Admitting: Psychiatry

## 2014-06-03 NOTE — Telephone Encounter (Signed)
Chart reviewed, refill appropriate 

## 2014-06-06 ENCOUNTER — Encounter: Payer: Self-pay | Admitting: Internal Medicine

## 2014-06-06 ENCOUNTER — Other Ambulatory Visit (INDEPENDENT_AMBULATORY_CARE_PROVIDER_SITE_OTHER): Payer: Managed Care, Other (non HMO)

## 2014-06-06 ENCOUNTER — Ambulatory Visit (INDEPENDENT_AMBULATORY_CARE_PROVIDER_SITE_OTHER): Payer: Managed Care, Other (non HMO) | Admitting: Internal Medicine

## 2014-06-06 VITALS — BP 140/100 | HR 96 | Temp 97.9°F | Resp 16 | Wt 190.0 lb

## 2014-06-06 DIAGNOSIS — I10 Essential (primary) hypertension: Secondary | ICD-10-CM

## 2014-06-06 DIAGNOSIS — D509 Iron deficiency anemia, unspecified: Secondary | ICD-10-CM | POA: Insufficient documentation

## 2014-06-06 DIAGNOSIS — M5010 Cervical disc disorder with radiculopathy, unspecified cervical region: Secondary | ICD-10-CM

## 2014-06-06 DIAGNOSIS — F322 Major depressive disorder, single episode, severe without psychotic features: Secondary | ICD-10-CM

## 2014-06-06 DIAGNOSIS — F329 Major depressive disorder, single episode, unspecified: Secondary | ICD-10-CM

## 2014-06-06 DIAGNOSIS — M25521 Pain in right elbow: Secondary | ICD-10-CM | POA: Diagnosis not present

## 2014-06-06 DIAGNOSIS — E291 Testicular hypofunction: Secondary | ICD-10-CM

## 2014-06-06 DIAGNOSIS — Z23 Encounter for immunization: Secondary | ICD-10-CM

## 2014-06-06 DIAGNOSIS — F411 Generalized anxiety disorder: Secondary | ICD-10-CM

## 2014-06-06 DIAGNOSIS — D72829 Elevated white blood cell count, unspecified: Secondary | ICD-10-CM | POA: Insufficient documentation

## 2014-06-06 LAB — HEPATIC FUNCTION PANEL
ALT: 29 U/L (ref 0–53)
AST: 25 U/L (ref 0–37)
Albumin: 3.7 g/dL (ref 3.5–5.2)
Alkaline Phosphatase: 71 U/L (ref 39–117)
Bilirubin, Direct: 0.1 mg/dL (ref 0.0–0.3)
Total Bilirubin: 0.8 mg/dL (ref 0.2–1.2)
Total Protein: 6.8 g/dL (ref 6.0–8.3)

## 2014-06-06 LAB — CBC WITH DIFFERENTIAL/PLATELET
Basophils Absolute: 0.1 10*3/uL (ref 0.0–0.1)
Basophils Relative: 0.4 % (ref 0.0–3.0)
Eosinophils Absolute: 0.1 10*3/uL (ref 0.0–0.7)
Eosinophils Relative: 0.4 % (ref 0.0–5.0)
HCT: 42.2 % (ref 39.0–52.0)
Hemoglobin: 13.1 g/dL (ref 13.0–17.0)
Lymphocytes Relative: 6.7 % — ABNORMAL LOW (ref 12.0–46.0)
Lymphs Abs: 1.2 10*3/uL (ref 0.7–4.0)
MCHC: 31 g/dL (ref 30.0–36.0)
MCV: 66.7 fl — ABNORMAL LOW (ref 78.0–100.0)
Monocytes Absolute: 1.6 10*3/uL — ABNORMAL HIGH (ref 0.1–1.0)
Monocytes Relative: 9 % (ref 3.0–12.0)
Neutro Abs: 14.6 10*3/uL — ABNORMAL HIGH (ref 1.4–7.7)
Neutrophils Relative %: 83.5 % — ABNORMAL HIGH (ref 43.0–77.0)
Platelets: 303 10*3/uL (ref 150.0–400.0)
RBC: 6.32 Mil/uL — ABNORMAL HIGH (ref 4.22–5.81)
RDW: 27.8 % — ABNORMAL HIGH (ref 11.5–15.5)
WBC: 17.5 10*3/uL — ABNORMAL HIGH (ref 4.0–10.5)

## 2014-06-06 LAB — BASIC METABOLIC PANEL
BUN: 23 mg/dL (ref 6–23)
CO2: 23 mEq/L (ref 19–32)
Calcium: 7.9 mg/dL — ABNORMAL LOW (ref 8.4–10.5)
Chloride: 101 mEq/L (ref 96–112)
Creatinine, Ser: 1.1 mg/dL (ref 0.4–1.5)
GFR: 74.8 mL/min (ref >60.00)
Glucose, Bld: 160 mg/dL — ABNORMAL HIGH (ref 70–99)
Potassium: 3.5 mEq/L (ref 3.5–5.1)
Sodium: 134 mEq/L — ABNORMAL LOW (ref 135–145)

## 2014-06-06 LAB — TESTOSTERONE: Testosterone: 128.09 ng/dL — ABNORMAL LOW (ref 300.00–890.00)

## 2014-06-06 LAB — IBC PANEL
Iron: 45 ug/dL (ref 42–165)
Saturation Ratios: 8.4 % — ABNORMAL LOW (ref 20.0–50.0)
Transferrin: 383.7 mg/dL — ABNORMAL HIGH (ref 212.0–360.0)

## 2014-06-06 LAB — TSH: TSH: 1.73 u[IU]/mL (ref 0.35–4.50)

## 2014-06-06 LAB — T4, FREE: Free T4: 1.14 ng/dL (ref 0.60–1.60)

## 2014-06-06 MED ORDER — TEMAZEPAM 30 MG PO CAPS: 30.0000 mg | ORAL_CAPSULE | Freq: Every evening | ORAL | Status: DC | PRN

## 2014-06-06 NOTE — Assessment & Plan Note (Signed)
10/15 ?steroid injection related Will re-check

## 2014-06-06 NOTE — Assessment & Plan Note (Addendum)
Chronic  Dr Adele Schilder Continue with current prescription therapy as reflected on the Med list.

## 2014-06-06 NOTE — Assessment & Plan Note (Addendum)
2015 Dr Apolonio Schneiders, Dr Maryjean Ka. On Prednisone taper

## 2014-06-06 NOTE — Assessment & Plan Note (Signed)
Dr Adele Schilder Chronic bipolar A lot of stress at home - son, dtr Continue with current prescription therapy as reflected on the Med list.

## 2014-06-06 NOTE — Progress Notes (Deleted)
Pre visit review using our clinic review tool, if applicable. No additional management support is needed unless otherwise documented below in the visit note. 

## 2014-06-06 NOTE — Assessment & Plan Note (Signed)
03/08/13 s/p fall and fx R elbow surgery 04/28/14, s/p fx - Dr Apolonio Schneiders Continue with current prescription therapy as reflected on the Med list.

## 2014-06-06 NOTE — Assessment & Plan Note (Addendum)
Continue with current prescription therapy as reflected on the Med list. Labs  Potential benefits of a long term sex steroid  use as well as potential risks  and complications were explained to the patient and were aknowledged.   

## 2014-06-06 NOTE — Assessment & Plan Note (Signed)
On iron Labs 

## 2014-06-06 NOTE — Progress Notes (Signed)
Subjective:      F/u stress, dizziness, anemia, elevated PSA and hypogonadism on Rx per Dr Diona Fanti, pains in other joints C/o R arm pain post-op, depression/stress/anxiety, arthritis   Wt Readings from Last 3 Encounters:  06/06/14 190 lb (86.183 kg)  04/28/14 181 lb 8 oz (82.328 kg)  04/28/14 181 lb 8 oz (82.328 kg)   BP Readings from Last 3 Encounters:  06/06/14 140/100  04/28/14 144/90  04/28/14 144/90    HPILow BPs lately  The patient presents for a follow-up of  chronic hypertension - worse due to stress, chronic dyslipidemia, prostatitis, malignancies controlled with medicines - stable    Alex [name] 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  Alex [name] tried to overdose himself several times 6/14, 10/14, 7/15 - self-harm  One dtr has moved in - stress is worse (drugs)    BP Readings from Last 3 Encounters:  06/06/14 140/100  04/28/14 144/90  04/28/14 144/90   Wt Readings from Last 3 Encounters:  06/06/14 190 lb (86.183 kg)  04/28/14 181 lb 8 oz (82.328 kg)  04/28/14 181 lb 8 oz (82.328 kg)   BP 140/100  Pulse 96  Temp(Src) 97.9 F (36.6 C) (Oral)  Resp 16  Wt 190 lb (86.183 kg)  Past Medical History  Diagnosis Date  . Anxiety   . Hypertension   . Hyperlipidemia   . Immune deficiency disorder     COMMON VARIABLE  . GERD (gastroesophageal reflux disease)   . Glaucoma BOTH EYES -- NO DROPS   . History of thyroid cancer PRIMARY (NO METS FROM ORBITAL CANCER)--   IN REMISSION    S/P TOTAL THYROIDECTOMY  , CHEMORADIATION  (ONCOLOGIST -- DR Griffith Citron)  . 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 Renal calculi LEFT KIDNEY-- NON-OBSTRUCTIVE  .  Depression   . Bipolar I disorder   . Complication of anesthesia POST URINARY RETENTION---  2006 SHOULDER SURGERY MARKED BRADYCARDIA VAGAL RESPONSE NO ISSUE W/ SURGERY AFTER THIS ONE  . BPH (benign prostatic hypertrophy) with urinary obstruction   . Positional vertigo   . Coronary atherosclerosis CARDIOLOGIST- DR CRENSHAW--  LAST VISIT 01-05-2012 IN [facility]    NON-OBSTRUCTIVE MILD DISEASE  . Urinary hesitancy   . Epicondylitis     right elbow  . Ulnar nerve compression     right elbow  . Chronic back pain   . OA (osteoarthritis)   . Nocturia   . History of chronic prostatitis   . At risk for sleep apnea     STOP-BANG= 5    SENT TO PCP 04-22-2014   Past Surgical History  Procedure Laterality Date  . Occuloplastic surgery  2002  . Total thyroidectomy  11-03-2001    PAPILLARY THYROID CARCINOMA  . Right supraomohyoid neck dissection   03-08-2003    ZONES 1,2,3;   SUBMANDIBULAR MASS / METASTATIC SQUAMOUS CELL CARCINOMA RIGHT NECK  . Pars plana vitrectomy  11-06-2004    RIGHT EYE RADIATION RETINOPATHY W/ HEMORRHAGE  . Left hydrocelectomy  03-29-2005    AND REPAIR LEFT INGUINAL HERNIA W/ MESH  . Nasal endoscopy  08-07-2005    RIGHT  EPISTAXIS  / POST SEPTOPLASTY  (HX RIGHT ORBITAL CA & S/P RADIATION/ NECROSIS ANTERIOR END OF BOTH INFERIOR TURBINATES)  . Cardiac catheterization  01-16-2006  DR THOMAS WALL    MILD CORONARY ATHEROSCLEROSIS/ MID TO DISTAL LAD 40% STENOSIS/ LVF 50-55%  . Shoulder arthroscopy w/ subacromial decompression and distal clavicle excision  10-09-2008    AND DEBRIDEMENT OF RIGHT SHOULDER IMPINGEMENT & [facility] JOINT ARTHRITIS  . Right ankle arthroscopy w/ extensive debridement  04-05-2008   . Left ankle arthroscopy w/ debridement  05-12-2007  . Mohs surgery  2002    RIGHT ORBITAL CANCER  . Repair undesended right testicle / right inguinal hernia  AGE 52  . Septoplasty  NOV 2006  . Knee arthroscopy  05/01/2012    Procedure: ARTHROSCOPY KNEE;  Surgeon: Johnn Hai, MD;   [facility];  Service: Orthopedics;  Laterality: Left;  debridement and removal of loose body  . Transthoracic echocardiogram  12/ 2012    grade I diastolic dysfunction/ ef 62-94%  . Ulnar nerve transposition Right 04/28/2014    Procedure: RIGHT ELBOW ULNA NERVE RELEASE TRANSPOSTION AND MEDIAL EPICONDYLAR DEBRIDEMENT AND REPAIR;  Surgeon: Linna Hoff, MD;  Location: [place];  Service: Orthopedics;  Laterality: Right;    reports that he has never smoked. He has never used smokeless tobacco. He reports that he does not drink alcohol or use illicit drugs. family history includes Depression in his daughter and sister; Drug abuse in his daughter. Allergies  Allergen Reactions  . Percocet [Oxycodone-Acetaminophen] Itching    Can take generic.  States only has a problem with percocet brand     Current Outpatient Prescriptions on File Prior to Visit  Medication Sig Dispense Refill  . aspirin EC 81 MG tablet Take 81 mg by mouth daily.      [name] atorvastatin (LIPITOR) 40 MG tablet TAKE 1 TABLET BY MOUTH ONCE DAILY-  takes in pm      . Coenzyme Q10 (CO Q 10 PO) Take by mouth daily.      . ferrous sulfate 325 (65 FE) MG tablet Take 1 tablet (325 mg total) by mouth daily.  30 tablet  3  . guaiFENesin (MUCINEX) 600 MG 12 hr tablet Take 600 mg by mouth 2 (two) times daily as needed. For congestion      . hydroxypropyl methylcellulose (ISOPTO TEARS) 2.5 % ophthalmic solution Place 1 drop into both eyes 4 (four) times daily as needed. For dry eyes      . Immune Globulin, Human, (HIZENTRA) 4 GM/20ML SOLN Inject into the skin once a week. wednesday      . LamoTRIgine 300 MG TB24 Take 1 tablet by mouth every morning. TAKE 1 TABLET EVERY DAY      . levothyroxine (SYNTHROID) 150 MCG tablet Take 150 mcg by mouth every evening.      [name] LORazepam (ATIVAN) 0.5 MG tablet TAKE 1 TABLET TWICE A DAY AS NEEDED FOR ANXIETY  60 tablet  0  . multivitamin-lutein (OCUVITE-LUTEIN)  CAPS capsule Take 1 capsule by mouth daily.      . naproxen (NAPRELAN) 500 MG 24 hr tablet Take 1 tablet (500 mg total) by mouth daily with breakfast.  60 tablet  5  . OMEGA-3 KRILL OIL PO Take by mouth daily.      [name] oxyCODONE (ROXICODONE) 5 MG immediate release tablet Take 1 tablet (5 mg total) by mouth every 4 (four) hours as needed for severe pain.  45 tablet  0  . RABEprazole (ACIPHEX) 20 MG tablet TAKE 1 TABLET BY MOUTH EVERY DAY--  takes in pm      . telmisartan (MICARDIS) 80 MG tablet TAKE 1 TABLET (80 MG TOTAL) BY MOUTH DAILY.--   takes in am      . Testosterone (AXIRON) 30 MG/ACT SOLN Place 2 Act onto the skin every morning.      . traMADol (ULTRAM) 50 MG tablet Take 1-2 tablets (50-100 mg total) by mouth 2 (two) times daily as needed.  120 tablet  1   No current facility-administered medications on file prior to visit.     Review of Systems  Constitutional: Negative for appetite change, fatigue and unexpected weight change.  HENT: Negative for congestion, nosebleeds, sneezing, sore throat and trouble swallowing.   Eyes: Negative for itching and visual disturbance.  Respiratory: Negative for cough.   Cardiovascular: Negative for leg swelling.  Gastrointestinal: Negative for nausea, diarrhea, blood in stool and abdominal distention.  Genitourinary: Negative for frequency and hematuria.  Musculoskeletal: Negative for back pain, gait problem and joint swelling.  Skin: Negative for rash.  Neurological: Negative for dizziness, tremors, speech difficulty and weakness.  Psychiatric/Behavioral: Negative for suicidal ideas, sleep disturbance, dysphoric mood and agitation. The patient is nervous/anxious.    Prostate was checkked by Dr D.    Objective:   Physical Exam  Constitutional: He is oriented to person, place, and time. He appears well-developed. No distress.  HENT:  Mouth/Throat: Oropharynx is clear and moist.  Eyes: Conjunctivae are normal. Pupils are equal, round, and  reactive to light.  Neck: Normal range of motion. No JVD present. No thyromegaly present.  Cardiovascular: Normal rate, regular rhythm, normal heart sounds and intact distal pulses.  Exam reveals no gallop and no friction rub.   No murmur heard. Pulmonary/Chest: Effort normal and breath sounds normal. No respiratory distress. He has no wheezes. He has no rales. He exhibits no tenderness.  Abdominal: Soft. Bowel sounds are normal. He exhibits no distension and no mass. There is no tenderness. There is no rebound and no guarding.  Genitourinary: Rectum normal and prostate normal. Guaiac negative stool.  Testes nl  Musculoskeletal: Normal range of motion. He exhibits no edema and no tenderness.  Lymphadenopathy:    He has no cervical adenopathy.  Neurological: He is alert and oriented to person, place, and time. He has normal reflexes. No cranial nerve deficit. He exhibits normal muscle tone. Coordination normal.  Skin: Skin is warm and dry. No rash noted.  Psychiatric: He has a normal mood and affect. His behavior is normal. Judgment and thought content normal.  anxious   Caries R arm is in a brace   Lab Results  Component Value Date   WBC 17.5* 06/06/2014   HGB 13.1 06/06/2014   HCT 42.2 06/06/2014   PLT 303.0 06/06/2014   GLUCOSE 160* 06/06/2014   CHOL 154 11/22/2013   TRIG 102.0 11/22/2013   HDL 57.80 11/22/2013   LDLDIRECT 134.7 03/05/2007   LDLCALC 76 11/22/2013   ALT 29 06/06/2014   AST 25 06/06/2014   NA 134* 06/06/2014   K 3.5 06/06/2014   CL 101 06/06/2014   CREATININE 1.1 06/06/2014   BUN 23 06/06/2014   CO2 23 06/06/2014   TSH 1.73 06/06/2014   PSA 1.59 05/27/2013   HGBA1C 6.1 08/04/2012   A complex case       Assessment & Plan:

## 2014-06-06 NOTE — Assessment & Plan Note (Signed)
Better  Wt Readings from Last 3 Encounters:  06/06/14 190 lb (86.183 kg)  04/28/14 181 lb 8 oz (82.328 kg)  04/28/14 181 lb 8 oz (82.328 kg)   BP Readings from Last 3 Encounters:  06/06/14 140/100  04/28/14 144/90  04/28/14 144/90

## 2014-06-12 DIAGNOSIS — K284 Chronic or unspecified gastrojejunal ulcer with hemorrhage: Secondary | ICD-10-CM

## 2014-06-12 DIAGNOSIS — K274 Chronic or unspecified peptic ulcer, site unspecified, with hemorrhage: Secondary | ICD-10-CM

## 2014-06-12 HISTORY — DX: Chronic or unspecified gastrojejunal ulcer with hemorrhage: K28.4

## 2014-06-12 HISTORY — DX: Chronic or unspecified peptic ulcer, site unspecified, with hemorrhage: K27.4

## 2014-06-15 ENCOUNTER — Ambulatory Visit (HOSPITAL_COMMUNITY): Payer: Self-pay | Admitting: Psychiatry

## 2014-06-18 ENCOUNTER — Other Ambulatory Visit (HOSPITAL_COMMUNITY): Payer: Self-pay | Admitting: Psychiatry

## 2014-06-22 ENCOUNTER — Other Ambulatory Visit: Payer: Self-pay | Admitting: Neurosurgery

## 2014-06-23 ENCOUNTER — Ambulatory Visit (HOSPITAL_COMMUNITY): Payer: Self-pay | Admitting: Psychiatry

## 2014-06-23 LAB — CBC AND DIFFERENTIAL
HCT: 9 % — AB (ref 41–53)
Hemoglobin: 4.6 g/dL — AB (ref 13.5–17.5)
Platelets: 24 10*3/uL — AB (ref 150–399)
WBC: 6.5 10^3/mL

## 2014-06-24 ENCOUNTER — Other Ambulatory Visit: Payer: Self-pay | Admitting: Internal Medicine

## 2014-06-29 ENCOUNTER — Ambulatory Visit (HOSPITAL_COMMUNITY): Payer: Self-pay | Admitting: Psychiatry

## 2014-06-29 ENCOUNTER — Ambulatory Visit (INDEPENDENT_AMBULATORY_CARE_PROVIDER_SITE_OTHER): Payer: Managed Care, Other (non HMO) | Admitting: Psychiatry

## 2014-06-29 ENCOUNTER — Other Ambulatory Visit: Payer: Self-pay | Admitting: Oncology

## 2014-06-29 ENCOUNTER — Encounter (HOSPITAL_COMMUNITY): Payer: Self-pay | Admitting: Psychiatry

## 2014-06-29 VITALS — BP 130/80 | HR 89 | Ht 72.0 in | Wt 187.6 lb

## 2014-06-29 DIAGNOSIS — F319 Bipolar disorder, unspecified: Secondary | ICD-10-CM

## 2014-06-29 MED ORDER — LORAZEPAM 0.5 MG PO TABS: 0.5000 mg | ORAL_TABLET | Freq: Two times a day (BID) | ORAL | Status: DC

## 2014-06-29 NOTE — Progress Notes (Signed)
[name] [phone] Progress Note  Alex [name] [id number] 58 y.o.  06/29/2014 4:16 PM  Chief Complaint:  Medication management and followup.      History of Present Illness:  Alex [name] came for his followup appointment.  He is complaining of increased anxiety and nervousness.  He has seen multiple doctors in recent weeks.  He is taking multiple pain medication however he stopped Taking Nycenta., Naprosyn, tramadol because it does not help.  He is taking oxycodone which is prescribed by Dr. [name]. He also tried temazepam because he felt that Ambien was not working but he like to go back on Ambien.  Temazepam was prescribed by his primary care physician.  Patient endorsed multiple health issues which are chronic.  He is also concerned about his family members.  [facility] and now he is living with him.  His daughter recently finished rehabilitation but he is not sure if she is not using pain medication from the streets.  He endorsed multiple psychosocial issues.  He admitted some time poor sleep, racing thoughts, irritability and frustration.  He is taking Ativan, Lamictal as prescribed.  He has no rash or itching.  He sought twice a therapist but due to financial strain could not see her again very soon.  However he reschedule appointment with her.  Overall his appetite is okay.  He denies any hallucination or any paranoia.  He denies any crying spells, feeling of hopelessness but admitted irritability and frustration.  His vitals are stable.  Suicidal Ideation: No Plan Formed: No Patient has means to carry out plan: No  Homicidal Ideation: No Plan Formed: No Patient has means to carry out plan: No  Review of Systems: Psychiatric: Agitation: No Hallucination: No Depressed Mood: No Insomnia: Yes Hypersomnia: No Altered Concentration: No Feels Worthless: No Grandiose Ideas: No Belief In Special Powers: No New/Increased Substance Abuse:  No Compulsions: No  Neurologic: Headache: Yes Seizure: No Paresthesias: No  Medical  history Patient has history of hypertension , immune deficiency syndrome , GERD , Tick disorder, coronary artery disease, hypertension, COPD, esophagus stricture, lumbago, osteoarthritis, malignant neoplasm of orbit, vertigo, osteoarthritis and chronic fatigue.   Outpatient Encounter Prescriptions as of 06/29/2014  Medication Sig  . zolpidem (AMBIEN) 10 MG tablet Take 10 mg by mouth at bedtime as needed for sleep.  [name] aspirin EC 81 MG tablet Take 81 mg by mouth daily.  [name] atorvastatin (LIPITOR) 40 MG tablet TAKE 1 TABLET BY MOUTH ONCE DAILY-  takes in pm  . Coenzyme Q10 (CO Q 10 PO) Take by mouth daily.  . ferrous sulfate 325 (65 FE) MG tablet Take 1 tablet (325 mg total) by mouth daily.  . hydroxypropyl methylcellulose (ISOPTO TEARS) 2.5 % ophthalmic solution Place 1 drop into both eyes 4 (four) times daily as needed. For dry eyes  . Immune Globulin, Human, (HIZENTRA) 4 GM/20ML SOLN Inject into the skin once a week. wednesday  . LamoTRIgine 300 MG TB24 TAKE 1 TABLET BY MOUTH ONCE DAILY  . levothyroxine (SYNTHROID) 150 MCG tablet Take 150 mcg by mouth every evening.  [name] LORazepam (ATIVAN) 0.5 MG tablet Take 1 tablet (0.5 mg total) by mouth 2 (two) times daily.  . multivitamin-lutein (OCUVITE-LUTEIN) CAPS capsule Take 1 capsule by mouth daily.  . OMEGA-3 KRILL OIL PO Take by mouth daily.  [name] oxyCODONE-acetaminophen (PERCOCET) 7.5-325 MG per tablet Take 1 tablet by mouth every 6 (six) hours as needed.  . RABEprazole (ACIPHEX) 20 MG  tablet TAKE 1 TABLET BY MOUTH EVERY DAY--  takes in pm  . sucralfate (CARAFATE) 1 G tablet Take 1 g by mouth 4 (four) times daily.  [name] telmisartan (MICARDIS) 80 MG tablet TAKE 1 TABLET (80 MG TOTAL) BY MOUTH DAILY.--   takes in am  . Testosterone (AXIRON) 30 MG/ACT SOLN Place 2 Act onto the skin every morning.  . [DISCONTINUED] guaiFENesin (MUCINEX) 600 MG 12 hr tablet Take 600 mg  by mouth 2 (two) times daily as needed. For congestion  . [DISCONTINUED] LORazepam (ATIVAN) 0.5 MG tablet TAKE 1 TABLET TWICE A DAY AS NEEDED FOR ANXIETY  . [DISCONTINUED] naproxen (NAPRELAN) 500 MG 24 hr tablet Take 1 tablet (500 mg total) by mouth daily with breakfast.  . [DISCONTINUED] oxyCODONE (ROXICODONE) 5 MG immediate release tablet Take 1 tablet (5 mg total) by mouth every 4 (four) hours as needed for severe pain.  . [DISCONTINUED] temazepam (RESTORIL) 30 MG capsule Take 1 capsule (30 mg total) by mouth at bedtime as needed for sleep.  . [DISCONTINUED] traMADol (ULTRAM) 50 MG tablet TAKE 1 TO 2 TABLETS BY MOUTH TWICE A DAY AS NEEDED    Past Psychiatric History/Hospitalization(s): Patient has been seeing in this office since 2009.  He has history of depression mood swing and anger.  He's been admitted to behavioral [facility] due to suicidal thoughts but he denies any suicidal attempt.  He denies any psychosis but admitted poor impulse control.  In the past he has taken Seroquel and Cymbalta.   Anxiety: Yes Bipolar Disorder: Yes Depression: Yes Mania: No Psychosis: No Schizophrenia: No Personality Disorder: No Hospitalization for psychiatric illness: Yes History of Electroconvulsive Shock Therapy: No Prior Suicide Attempts: No  Physical Exam: Constitutional:  BP 130/80 mmHg  Pulse 89  Ht 6' (1.829 m)  Wt 187 lb 9.6 oz (85.095 kg)  BMI 25.44 kg/m2  General Appearance: alert, oriented, no acute distress  Musculoskeletal: Strength & Muscle Tone: within normal limits Gait & Station: normal Patient leans: N/A  Review of Systems  Musculoskeletal: Positive for back pain.  Skin: Negative for itching and rash.  Neurological: Positive for headaches.       Restless  Psychiatric/Behavioral: The patient is nervous/anxious and has insomnia.    Recent Results (from the past 2160 hour(s))  I-STAT, chem 8     Status: Abnormal   Collection Time: 04/28/14  2:08 PM  Result Value  Ref Range   Sodium 138 137 - 147 mEq/L   Potassium 4.2 3.7 - 5.3 mEq/L   Chloride 103 96 - 112 mEq/L   BUN 17 6 - 23 mg/dL   Creatinine, Ser 0.90 0.50 - 1.35 mg/dL   Glucose, Bld 100 (H) 70 - 99 mg/dL   Calcium, Ion 1.16 1.13 - 1.30 mmol/L   TCO2 24 0 - 100 mmol/L   Hemoglobin 14.6 13.0 - 17.0 g/dL   HCT 43.0 39.0 - 52.0 %  TSH     Status: None   Collection Time: 06/06/14  2:55 PM  Result Value Ref Range   TSH 1.73 0.35 - 4.50 uIU/mL  Testosterone     Status: Abnormal   Collection Time: 06/06/14  2:55 PM  Result Value Ref Range   Testosterone 128.09 (L) 300.00 - 890.00 ng/dL  Hepatic function panel     Status: None   Collection Time: 06/06/14  2:55 PM  Result Value Ref Range   Total Bilirubin 0.8 0.2 - 1.2 mg/dL   Bilirubin, Direct 0.1 0.0 - 0.3 mg/dL  Alkaline Phosphatase 71 39 - 117 U/L   AST 25 0 - 37 U/L   ALT 29 0 - 53 U/L   Total Protein 6.8 6.0 - 8.3 g/dL   Albumin 3.7 3.5 - 5.2 g/dL  Basic metabolic panel     Status: Abnormal   Collection Time: 06/06/14  2:55 PM  Result Value Ref Range   Sodium 134 (L) 135 - 145 mEq/L   Potassium 3.5 3.5 - 5.1 mEq/L   Chloride 101 96 - 112 mEq/L   CO2 23 19 - 32 mEq/L   Glucose, Bld 160 (H) 70 - 99 mg/dL   BUN 23 6 - 23 mg/dL   Creatinine, Ser 1.1 0.4 - 1.5 mg/dL   Calcium 7.9 (L) 8.4 - 10.5 mg/dL   GFR 74.80 >60.00 mL/min  CBC with Differential     Status: Abnormal   Collection Time: 06/06/14  2:55 PM  Result Value Ref Range   WBC 17.5 (H) 4.0 - 10.5 K/uL   RBC 6.32 (H) 4.22 - 5.81 Mil/uL   Hemoglobin 13.1 13.0 - 17.0 g/dL   HCT 42.2 39.0 - 52.0 %   MCV 66.7 (L) 78.0 - 100.0 fl   MCHC 31.0 30.0 - 36.0 g/dL   RDW 27.8 (H) 11.5 - 15.5 %   Platelets 303.0 150.0 - 400.0 K/uL   Neutrophils Relative % 83.5 (H) 43.0 - 77.0 %   Lymphocytes Relative 6.7 (L) 12.0 - 46.0 %   Monocytes Relative 9.0 3.0 - 12.0 %   Eosinophils Relative 0.4 0.0 - 5.0 %   Basophils Relative 0.4 0.0 - 3.0 %   Neutro Abs 14.6 (H) 1.4 - 7.7 K/uL    Lymphs Abs 1.2 0.7 - 4.0 K/uL   Monocytes Absolute 1.6 (H) 0.1 - 1.0 K/uL   Eosinophils Absolute 0.1 0.0 - 0.7 K/uL   Basophils Absolute 0.1 0.0 - 0.1 K/uL   Microcytosis Presence of (A) None  IBC panel     Status: Abnormal   Collection Time: 06/06/14  2:55 PM  Result Value Ref Range   Iron 45 42 - 165 ug/dL   Transferrin 383.7 (H) 212.0 - 360.0 mg/dL   Saturation Ratios 8.4 (L) 20.0 - 50.0 %  T4, free     Status: None   Collection Time: 06/06/14  2:55 PM  Result Value Ref Range   Free T4 1.14 0.60 - 1.60 ng/dL    Mental status examination Patient is casually dressed and fairly groomed.  He is anxious but maintained a good eye contact.  He described his mood is euthymic and his affect is mood appropriate.  He denies any active or passive suicidal thoughts or homicidal thoughts.  He denies any auditory or visual hallucination.  He is restless but there were no tremors or shakes present.   His thought process is logical linear and goal-directed.  There were no flight of ideas present at this time.  His fund of knowledge is adequate.  His attention and concentration is okay.  He's alert and oriented x3.  His insight judgment and impulse control is okay.   Established Problem, Stable/Improving (1), Review of Psycho-Social Stressors (1), Review or order clinical lab tests (1), New Problem, with no additional work-up planned (3), Review of Last Therapy Session (1), Review of Medication Regimen & Side Effects (2) and Review of New Medication or Change in Dosage (2)  Assessment: Axis I:  bipolar disorder1  Axis II:  deferred  Axis III:  see medical history  Patient Active Problem List   Diagnosis Date Noted  . Cervical disc disorder with radiculopathy of cervical region 06/06/2014  . Right elbow pain 06/06/2014  . Elevated WBC count 06/06/2014  . Iron deficiency anemia 06/06/2014  . Pre-operative exam 04/07/2014  . Chronic right SI joint pain 02/21/2014  . Edema 02/21/2014  . Tinnitus  02/21/2014  . Well adult exam 05/31/2013  . Glaucoma 05/31/2013  . RML pneumonia 03/31/2013  . Fever, unspecified 03/26/2013  . Hypertension, uncontrolled 02/25/2013  . Preop exam for internal medicine 04/02/2012  . Ingrowing toenail with infection 04/02/2012  . Localized osteoarthritis of left knee 04/02/2012  . Vertigo 01/05/2012  . Ataxia 01/05/2012  . Anxiety state 08/08/2010  . COPD 11/30/2009  . ESOPHAGEAL STRICTURE 11/30/2009  . CHANGE IN BOWELS 11/30/2009  . Major depression, chronic 08/07/2009  . Osteoarthritis 08/07/2009  . GENERALIZED OSTEOARTHROSIS UNSPECIFIED SITE 10/20/2008  . HYPERLIPIDEMIA-MIXED 10/19/2008  . FOOT PAIN 07/11/2008  . EPIDYDIMITIS/ORCHITIS 05/24/2008  . FATIGUE 05/14/2008  . RASH-NONVESICULAR 05/14/2008  . Cough 03/29/2008  . CORONARY ATHEROSCLEROSIS 11/04/2007  . Lumbago 11/04/2007  . THYROIDECTOMY, HX OF 11/04/2007  . 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 NEOPLASM, MALIGNANT, THYROID GLAND 10/02/2007  . 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 IMPAIRED GLUCOSE TOLERANCE 10/02/2007    Axis IV: Mild to moderate  Axis V: 55-65  Plan:  I reviewed current medication, blood results, collateral information.  I had a long discussion about polypharmacy especially benzodiazepine and pain medication.  We received a letter from his pharmacy that he's been taking multiple pain medication.  However patient told that majority of the pain medication has been discontinued.  He took some when he has elbow surgery.  He is only taking oxycodone as needed.  He is no longer taking temazepam.  He is taking Ativan 0.5 mg twice a day, Ambien 5 mg as needed and sometimes repeat another 5 mg per insomnia.  He is taking Lamictal 300 mg daily.  He denies any rash.  I encouraged him to see a therapist on a regular basis.  Discuss medication side effects including dependency, tolerance and abuse.  Patient agreed and acknowledged that  he will contact us if any new medication added to his regimen.  I will see him again in 3 months. Time spent 25 minutes.  More than 50% of the time spent in psychoeducation, counseling and coordination of care.  Discuss safety plan that anytime having active suicidal thoughts or homicidal thoughts then patient need to call 911 or go to the local emergency room.  Grant Swager T., MD 06/29/2014

## 2014-07-01 ENCOUNTER — Telehealth: Payer: Self-pay | Admitting: *Deleted

## 2014-07-01 ENCOUNTER — Encounter: Payer: Self-pay | Admitting: Internal Medicine

## 2014-07-01 DIAGNOSIS — D509 Iron deficiency anemia, unspecified: Secondary | ICD-10-CM

## 2014-07-01 NOTE — Telephone Encounter (Signed)
OK CBC, Iron/TIBC Thx

## 2014-07-01 NOTE — Telephone Encounter (Signed)
Pt left vm stating recent labs from Dr. Oletta Lamas showed Hgb is 9.5. He has doubled up on his Iron. He wants to know if PCP advises repeating labs. If so, he wants written order to take to [facility] as he can have labs drawn there at no charge. Please advise

## 2014-07-04 NOTE — Telephone Encounter (Signed)
Entered labs & printed. Called pt no answer [facility] orders ready for pick-up...Alex [name]

## 2014-07-05 ENCOUNTER — Other Ambulatory Visit: Payer: Self-pay | Admitting: Internal Medicine

## 2014-07-05 LAB — IRON AND TIBC
%SAT: 2 % — ABNORMAL LOW (ref 20–55)
Iron: 12 ug/dL — ABNORMAL LOW (ref 42–165)
TIBC: 535 ug/dL — ABNORMAL HIGH (ref 215–435)
UIBC: 523 ug/dL — ABNORMAL HIGH (ref 125–400)

## 2014-07-06 LAB — CBC WITH DIFFERENTIAL/PLATELET
Basophils Absolute: 0.1 10*3/uL (ref 0.0–0.1)
Basophils Relative: 1 % (ref 0–1)
Eosinophils Absolute: 0.2 10*3/uL (ref 0.0–0.7)
Eosinophils Relative: 2 % (ref 0–5)
HCT: 34 % — ABNORMAL LOW (ref 39.0–52.0)
Hemoglobin: 10.2 g/dL — ABNORMAL LOW (ref 13.0–17.0)
Lymphocytes Relative: 19 % (ref 12–46)
Lymphs Abs: 1.7 10*3/uL (ref 0.7–4.0)
MCH: 20.4 pg — ABNORMAL LOW (ref 26.0–34.0)
MCHC: 30 g/dL (ref 30.0–36.0)
MCV: 68 fL — ABNORMAL LOW (ref 78.0–100.0)
MPV: 8.3 fL — ABNORMAL LOW (ref 9.4–12.4)
Monocytes Absolute: 1.4 10*3/uL — ABNORMAL HIGH (ref 0.1–1.0)
Monocytes Relative: 16 % — ABNORMAL HIGH (ref 3–12)
Neutro Abs: 5.5 10*3/uL (ref 1.7–7.7)
Neutrophils Relative %: 62 % (ref 43–77)
Platelets: 386 10*3/uL (ref 150–400)
RBC: 5 MIL/uL (ref 4.22–5.81)
RDW: 22.5 % — ABNORMAL HIGH (ref 11.5–15.5)
WBC: 8.9 10*3/uL (ref 4.0–10.5)

## 2014-07-11 ENCOUNTER — Encounter: Payer: Self-pay | Admitting: Internal Medicine

## 2014-07-17 ENCOUNTER — Other Ambulatory Visit (HOSPITAL_COMMUNITY): Payer: Self-pay | Admitting: Psychiatry

## 2014-07-17 ENCOUNTER — Telehealth (HOSPITAL_COMMUNITY): Payer: Self-pay

## 2014-07-18 ENCOUNTER — Telehealth (HOSPITAL_COMMUNITY): Payer: Self-pay

## 2014-07-18 ENCOUNTER — Other Ambulatory Visit (HOSPITAL_COMMUNITY): Payer: Self-pay | Admitting: Psychiatry

## 2014-07-18 DIAGNOSIS — F319 Bipolar disorder, unspecified: Secondary | ICD-10-CM

## 2014-07-18 MED ORDER — ZOLPIDEM TARTRATE 10 MG PO TABS: 10.0000 mg | ORAL_TABLET | Freq: Every evening | ORAL | Status: DC | PRN

## 2014-07-18 NOTE — Telephone Encounter (Signed)
07/18/14 3:54pm Patient's wife Yeriel Mineo BW#[id number] came and pick-up rx script.Marland KitchenMariana Kaufman

## 2014-07-22 ENCOUNTER — Ambulatory Visit (INDEPENDENT_AMBULATORY_CARE_PROVIDER_SITE_OTHER): Payer: Managed Care, Other (non HMO) | Admitting: Psychiatry

## 2014-07-22 DIAGNOSIS — F319 Bipolar disorder, unspecified: Secondary | ICD-10-CM

## 2014-07-22 NOTE — Progress Notes (Signed)
   THERAPIST PROGRESS NOTE  Session Time: 1:00-2:00  Participation Level: Active   Behavioral Response: Well GroomedAlertAnxious and Depressed   Type of Therapy: Individual Therapy   Treatment Goals addressed: Coping  Interventions: CBT, Motivational Interviewing, Strength-based, Supportive and Reframing   Summary: Pt. Continues to present with anxious and dysphoric affect. Pt. Reports ongoing problems with physical health and chronic pain including back pain and ulcers. Pt. Reports that he has iron deficiency and often feels lethargic. Pt. Discussed emotional estrangement from his wife and resentment for not feeling loved, respected, and appreciated for the financial and emotional contributions that he makes to the relationship. Pt. Also discussed on going fears about daughter's and son's mental health problems. Significant part of session spent discussion Pt.'s social isolation and spiritual disconnection. Pt. Was challenged regarding his beliefs about how others might connect with him in a support group environment and general belief that others would not want to hear about his problems. Pt. Was introduced to programming provided by the mental health association and was given support group and wellness group schedules.   Suicidal/Homicidal: Nowithout intent/plan   Therapist Response: Assessed patients current functioning and reviewed progress. Assisted patient with the expression of anger and loneliness. Made referral to mental health association for socialization.   Plan: Continue with CBT based therapy. Return again in two weeks.   Diagnosis: Axis I: Bipolar, Depressed  Axis II: No diagnosis   Nancie Neas, [facility] 07/22/2014

## 2014-07-25 ENCOUNTER — Encounter: Payer: Self-pay | Admitting: Internal Medicine

## 2014-07-28 ENCOUNTER — Other Ambulatory Visit: Payer: Self-pay | Admitting: Internal Medicine

## 2014-07-28 ENCOUNTER — Other Ambulatory Visit (HOSPITAL_COMMUNITY): Payer: Self-pay

## 2014-07-29 ENCOUNTER — Encounter: Payer: Self-pay | Admitting: Internal Medicine

## 2014-08-09 ENCOUNTER — Encounter (HOSPITAL_COMMUNITY): Payer: Self-pay

## 2014-08-09 ENCOUNTER — Inpatient Hospital Stay (HOSPITAL_COMMUNITY): Admit: 2014-08-09 | Payer: Managed Care, Other (non HMO) | Admitting: Neurosurgery

## 2014-08-09 ENCOUNTER — Other Ambulatory Visit: Payer: Self-pay | Admitting: Internal Medicine

## 2014-08-09 SURGERY — POSTERIOR LUMBAR FUSION 1 LEVEL
Anesthesia: General | Site: Back

## 2014-08-10 ENCOUNTER — Ambulatory Visit (HOSPITAL_COMMUNITY): Payer: Self-pay | Admitting: Psychiatry

## 2014-08-12 HISTORY — PX: SPINE SURGERY: SHX786

## 2014-08-24 ENCOUNTER — Ambulatory Visit (INDEPENDENT_AMBULATORY_CARE_PROVIDER_SITE_OTHER): Payer: 59 | Admitting: Psychiatry

## 2014-08-24 DIAGNOSIS — F319 Bipolar disorder, unspecified: Secondary | ICD-10-CM

## 2014-08-25 NOTE — Progress Notes (Signed)
   THERAPIST PROGRESS NOTE Session Time: 2:40-3:35  Participation Level: Active   Behavioral Response: Well GroomedAlertAnxious and Depressed   Type of Therapy: Individual Therapy   Treatment Goals addressed: Coping  Interventions: CBT, Motivational Interviewing, Strength-based, Supportive and Reframing   Summary: Pt. Continues to present with anxious and dysphoric affect. Pt. Reports ongoing physical pain and emotional disconnect from his wife. Pt. Reports that christmas was very stressful, went to [name] for birth of his grandson. Pt. Reports major stressor was report that his wife would be spending her vacation time with her girlfriend who has not been supportive of him. Session was focused on identifying internal strengths and external resources. Pt. Was again given referral for mental health association and encouraged to pursue social support with men. Pt. Processed history of traumatic relationships with men and fears of engaging. Pt. Was open to pursuing supportive male friendships.   Suicidal/Homicidal: Nowithout intent/plan   Therapist Response: Assessed patients current functioning and reviewed progress. Assisted patient with the expression of anger and loneliness. Made referral to mental health association for socialization.   Plan: Continue with CBT based therapy. Return again in two weeks.   Diagnosis: Axis I: Bipolar, Depressed  Axis II: No diagnosis    Nancie Neas, [facility] 08/25/2014

## 2014-08-29 ENCOUNTER — Telehealth: Payer: Self-pay

## 2014-08-29 DIAGNOSIS — D508 Other iron deficiency anemias: Secondary | ICD-10-CM

## 2014-08-29 NOTE — Telephone Encounter (Signed)
Do I need to place a ref in? Thx

## 2014-08-29 NOTE — Telephone Encounter (Signed)
Pt came in and filled out Walk In Patient form.   Gave copy of blood test results ordered by Dr. Marcello Moores on 08/09/14. "Iron count and other criteria still low. Hemaglobin is coming up however, am I taking NuIron daily. Increasing to two per day would cause much gastric distress. Please advise"  PCP comment on form is " hematology consult: to consider IV iron"  Order entered and form sent to scan.   Pt informed of PCP advisement.

## 2014-08-30 NOTE — Telephone Encounter (Signed)
I believe everything is complete. Hopefully Hematology will share any results they find.

## 2014-09-03 ENCOUNTER — Other Ambulatory Visit (HOSPITAL_COMMUNITY): Payer: Self-pay | Admitting: Psychiatry

## 2014-09-03 DIAGNOSIS — F319 Bipolar disorder, unspecified: Secondary | ICD-10-CM

## 2014-09-07 ENCOUNTER — Other Ambulatory Visit (HOSPITAL_COMMUNITY): Payer: Self-pay | Admitting: Psychiatry

## 2014-09-07 ENCOUNTER — Ambulatory Visit (INDEPENDENT_AMBULATORY_CARE_PROVIDER_SITE_OTHER): Payer: 59 | Admitting: Psychiatry

## 2014-09-07 DIAGNOSIS — F319 Bipolar disorder, unspecified: Secondary | ICD-10-CM

## 2014-09-07 NOTE — Progress Notes (Signed)
   THERAPIST PROGRESS NOTE  Session Time: 2:40-3:35  Participation Level: Active   Behavioral Response: Well GroomedAlertAnxious and Depressed   Type of Therapy: Individual Therapy   Treatment Goals addressed: Coping  Interventions: CBT, Motivational Interviewing, Strength-based, Supportive and Reframing   Summary: Pt. Continues to present with anxious and dysphoric affect. Pt. Continues to report absence of emotional and physical intimacy with his wife. Pt. Reports anger towards his wife and woman he identifies as an interloper in his relationship with his wife. Pt. Also reports difficulty in relationships with his children because of their tendency to take sides with his wife. Pt. Continues to demonstrate role of victim as he believes that his wife's behavior will not change, does not believe that marital therapy is an option because of his wife's attitude towards him, and feels trapped in the relationship due to financial obligations. Pt. Has expressed poor social support outside of his marriage and reluctance to pursue support groups or individual interests. Pt.'s pain was validated and opportunity for emotional processing was provided.  Suicidal/Homicidal: Nowithout intent/plan   Therapist Response: Assessed patients current functioning and reviewed progress. Assisted patient with the expression of anger and loneliness. Made referral to mental health association for socialization.   Plan: Continue with CBT based therapy. Return again in two weeks.   Diagnosis: Axis I: Bipolar, Depressed  Axis II: No diagnosis   Nancie Neas, [facility] 09/07/2014

## 2014-09-08 ENCOUNTER — Telehealth (HOSPITAL_COMMUNITY): Payer: Self-pay

## 2014-09-08 MED ORDER — ZOLPIDEM TARTRATE 10 MG PO TABS: 10.0000 mg | ORAL_TABLET | Freq: Every evening | ORAL | Status: DC | PRN

## 2014-09-08 NOTE — Telephone Encounter (Signed)
Telephone call back with patient after earlier call from him this morning questioning why his refill of Ambien had not gone through as was sent over form [facility] on [address].  Reviewed record and found it was pend and not sent.  Dr. Adele Schilder authorized a one time refill as patient is flying out of town this morning.  [facility] [address] and called patient back to inform the order was called in.  Patient to call back if any further problems filling medication.

## 2014-09-08 NOTE — Telephone Encounter (Signed)
Received [facility] from [facility] for [facility] refill of patient's Ambien.  Reviewed patient's record and patient has his next evaluation on 09/29/14. Refill appropriate to last patient until next evaluation.

## 2014-09-09 ENCOUNTER — Encounter: Payer: Self-pay | Admitting: Internal Medicine

## 2014-09-16 ENCOUNTER — Telehealth: Payer: Self-pay | Admitting: *Deleted

## 2014-09-16 ENCOUNTER — Telehealth: Payer: Self-pay | Admitting: Internal Medicine

## 2014-09-16 NOTE — Telephone Encounter (Signed)
Patient Name: Alex [name]  DOB: [birth year]    Initial Comment Caller states he ate, 2 hours later felt sick. Diarrhea, vomiting.    Nurse Assessment  Nurse: Mallie Mussel, RN, Alveta Heimlich Date/Time Eilene Ghazi Time): 09/16/2014 8:46:16 AM  Confirm and document reason for call. If symptomatic, describe symptoms. ---Caller states that he ate yesterday evening. 2 hours later, he began to feel sick. He has vomiting and diarrhea. He has vomited x 1 and diarrhea x 3. He last urinated 45 minutes ago. He has had some cramping prior to vomiting and diarrhea. He does have some dizziness and nausea. He has vertigo. His grandson has had a virus and he had to clean him up. He is able to walk without assistance.  Has the patient traveled out of the country within the last 30 days? ---No  Does the patient require triage? ---Yes  Related visit to physician within the last 2 weeks? ---No  Does the PT have any chronic conditions? (i.e. diabetes, asthma, etc.) ---Yes  List chronic conditions. ---HTN, Hypercholesterolemia, CA survivor     Guidelines    Guideline Title Affirmed Question Affirmed Notes  Vomiting MILD or MODERATE vomiting (e.g., 1 - 5 times / day) (all triage questions negative)    Final Disposition User   Gobles, RN, Alveta Heimlich

## 2014-09-16 NOTE — Telephone Encounter (Signed)
Patient Name: Alex [name]  DOB: [birth year]    Initial Comment Caller states he ate, 2 hours later felt sick. Diarrhea, vomiting.    Nurse Assessment  Nurse: Mallie Mussel, RN, Alveta Heimlich Date/Time Eilene Ghazi Time): 09/16/2014 8:46:16 AM  Confirm and document reason for call. If symptomatic, describe symptoms. ---Caller states that he ate yesterday evening. 2 hours later, he began to feel sick. He has vomiting and diarrhea. He has vomited x 1 and diarrhea x 3. He last urinated 45 minutes ago. He has had some cramping prior to vomiting and diarrhea. He does have some dizziness and nausea. He has vertigo. His grandson has had a virus and he had to clean him up. He is able to walk without assistance.  Has the patient traveled out of the country within the last 30 days? ---No  Does the patient require triage? ---Yes  Related visit to physician within the last 2 weeks? ---No  Does the PT have any chronic conditions? (i.e. diabetes, asthma, etc.) ---Yes  List chronic conditions. ---HTN, Hypercholesterolemia, CA survivor     Guidelines    Guideline Title Affirmed Question Affirmed Notes  Vomiting MILD or MODERATE vomiting (e.g., 1 - 5 times / day) (all triage questions negative)    Final Disposition User   Frederickson, RN, Alveta Heimlich

## 2014-09-16 NOTE — Telephone Encounter (Signed)
[place] [facility] - Client [place] [facility] Patient Name: Alex [name] Gender: Male DOB: 23-Nov-1955 Age: 59 Y 9 M 12 D Return Phone Number: [phone] (Primary), [phone] (Secondary) Address: [address] [place]/State/Zip: [place] [address] Client [place] [facility] [facility] - Client Client Site [place] - Day Physician Plotnikov, Alex Contact Type Fax Call Type Triage / Clinical Relationship To Patient Self Appointment Disposition EMR Appointment Not Necessary Return Phone Number [phone] (Primary) Chief Complaint Vomiting Initial Comment Caller states he ate, 2 hours later felt sick. Diarrhea, vomiting. PreDisposition Home Care Info pasted into Epic Yes Nurse Assessment Nurse: Mallie Mussel, RN, Alveta Heimlich Date/Time Eilene Ghazi Time): 09/16/2014 8:46:16 AM Confirm and document reason for call. If symptomatic, describe symptoms. ---Caller states that he ate yesterday evening. 2 hours later, he began to feel sick. He has vomiting and diarrhea. He has vomited x 1 and diarrhea x 3. He last urinated 45 minutes ago. He has had some cramping prior to vomiting and diarrhea. He does have some dizziness and nausea. He has vertigo. His grandson has had a virus and he had to clean him up. He is able to walk without assistance. Has the patient traveled out of the country within the last 30 days? ---No Does the patient require triage? ---Yes Related visit to physician within the last 2 weeks? ---No Does the PT have any chronic conditions? (i.e. diabetes, asthma, etc.) ---Yes List chronic conditions. ---HTN, Hypercholesterolemia, CA survivor Guidelines Guideline Title Affirmed Question Affirmed Notes Nurse Date/Time Eilene Ghazi Time) Vomiting MILD or MODERATE vomiting (e.g., 1 - 5 times / day) (all triage questions negative) Mallie Mussel, RN, Alveta Heimlich 09/16/2014 8:51:42 AM Disp. Time Eilene Ghazi Time) Disposition Final User 09/16/2014  8:57:19 AM Home Care Yes Mallie Mussel, RN, Alveta Heimlich PLEASE NOTE: All timestamps contained within this report are represented as [place] Standard Time. CONFIDENTIALTY NOTICE: This fax transmission is intended only for the addressee. It contains information that is legally privileged, confidential or otherwise protected from use or disclosure. If you are not the intended recipient, you are strictly prohibited from reviewing, disclosing, copying using or disseminating any of this information or taking any action in reliance on or regarding this information. If you have received this fax in error, please notify us immediately by telephone so that we can arrange for its return to [place]. Phone: [phone], Toll-Free: [phone], Fax: [phone] Page: 2 of 2 Call Id: [id number] Caller Understands: Yes Disagree/Comply: Comply Care Advice Given Per Guideline HOME CARE: You should be able to treat this at home. REASSURANCE: Vomiting can be caused by many things. It is often caused by a stomach virus (stomach flu) or mild food poisoning. Staying well-hydrated is the most important thing. * Sip water or a rehydration drink (e.g., Gatorade or Powerade). * After 4 hours without vomiting, increase the amount. * You may begin eating bland foods after 8 hours without vomiting. * Start with saltine crackers, white bread, rice, mashed potatoes, cereal, applesauce, etc. * You can resume a normal diet in 24-48 hours. * Discontinue all vitamins and non-prescription medicines for 24 hours. (Reason: may make vomiting worse.) * Avoid NSAIDs, which can cause gastritis EXPECTED COURSE: Vomiting from viral gastritis (stomach flu) usually stops in 12 to 48 hours. If diarrhea is present, it usually continues for several days. * Vomiting lasts over 48 hours * Signs of dehydration (e.g., no urination over 12 hours, very lightheaded) * You become worse. After Care Instructions Given Call Event Type User Date /  Time Description

## 2014-09-18 ENCOUNTER — Other Ambulatory Visit: Payer: Self-pay | Admitting: Oncology

## 2014-09-18 DIAGNOSIS — D508 Other iron deficiency anemias: Secondary | ICD-10-CM

## 2014-09-19 ENCOUNTER — Telehealth: Payer: Self-pay | Admitting: Oncology

## 2014-09-19 NOTE — Telephone Encounter (Signed)
, °

## 2014-09-20 ENCOUNTER — Other Ambulatory Visit: Payer: Self-pay | Admitting: *Deleted

## 2014-09-20 ENCOUNTER — Telehealth: Payer: Self-pay | Admitting: *Deleted

## 2014-09-20 ENCOUNTER — Telehealth: Payer: Self-pay | Admitting: Oncology

## 2014-09-20 NOTE — Telephone Encounter (Signed)
Per staff message and POF I have scheduled appts. Advised scheduler of appts and the MD appt for 3/4 is to late to start treament. Treatment first time high risk drug   JMW

## 2014-09-20 NOTE — Telephone Encounter (Signed)
S/W PT IN REF TO NP APPT. ON 10/14/14@12 :30-PER DR MAGRINAT LEFT VM WITH DR MAGRINAT'S NURSE LINE TO PLACE LAB ORDER WITH [facility]-PER PT REQUEST. INBOX MICHELLE WHEAT TO SEE POF

## 2014-09-21 ENCOUNTER — Ambulatory Visit (INDEPENDENT_AMBULATORY_CARE_PROVIDER_SITE_OTHER): Payer: 59 | Admitting: Psychiatry

## 2014-09-21 DIAGNOSIS — F319 Bipolar disorder, unspecified: Secondary | ICD-10-CM

## 2014-09-22 ENCOUNTER — Other Ambulatory Visit: Payer: Self-pay | Admitting: *Deleted

## 2014-09-22 NOTE — Progress Notes (Signed)
   THERAPIST PROGRESS NOTE  Session Time: 2:35-3:30  Participation Level: Active   Behavioral Response: Well GroomedAlertAnxious and Depressed   Type of Therapy: Individual Therapy   Treatment Goals addressed: Coping  Interventions: CBT, Motivational Interviewing, Strength-based, Supportive and Reframing   Summary: Pt. Continues to present with mildly anxious and dysphoric affect. Pt. Continues to report multiple chronic stressors including dissatisfaction in his marital relationship and concerns about adult children, daughter with substance dependence and son with severe bipolar disorder and self-harming disorder. Session focused on developing motivation for change and focusing on internal strengths and resources such as reconnecting to a church fellowship and shifting focus of church involvement from family to potential personal benefits, involvement in a male support group. Continuing to explore Pt.'s fears related to developing supportive relationships.   Suicidal/Homicidal: Nowithout intent/plan   Therapist Response: Assessed patients current functioning and reviewed progress. Assisted patient with the expression of anger and loneliness. Made referral to mental health association for socialization.   Plan: Continue with CBT based therapy. Return again in two weeks.   Diagnosis: Axis I: Bipolar, Depressed  Axis II: No diagnosis   Nancie Neas, [facility] 09/22/2014

## 2014-09-23 ENCOUNTER — Telehealth: Payer: Self-pay | Admitting: Oncology

## 2014-09-23 NOTE — Telephone Encounter (Signed)
Chart delivered °

## 2014-09-27 ENCOUNTER — Other Ambulatory Visit: Payer: Self-pay | Admitting: Physician Assistant

## 2014-09-27 ENCOUNTER — Other Ambulatory Visit: Payer: Self-pay | Admitting: Oncology

## 2014-09-27 ENCOUNTER — Telehealth: Payer: Self-pay | Admitting: *Deleted

## 2014-09-27 DIAGNOSIS — R1310 Dysphagia, unspecified: Secondary | ICD-10-CM

## 2014-09-27 NOTE — Telephone Encounter (Signed)
I have called to notify the patient that we have moved his feraheme appt from 3//4 to 3/7. Due to the fact it is a high risk first time drug. No answer, I have left message for him to call the office

## 2014-09-28 ENCOUNTER — Ambulatory Visit
Admission: RE | Admit: 2014-09-28 | Discharge: 2014-09-28 | Disposition: A | Discharge status: Home / Self Care | Payer: Managed Care, Other (non HMO) | Source: Ambulatory Visit | Attending: Physician Assistant | Admitting: Physician Assistant

## 2014-09-28 DIAGNOSIS — R1310 Dysphagia, unspecified: Secondary | ICD-10-CM

## 2014-09-29 ENCOUNTER — Ambulatory Visit (HOSPITAL_COMMUNITY): Payer: Self-pay | Admitting: Psychiatry

## 2014-09-29 ENCOUNTER — Other Ambulatory Visit: Payer: Self-pay

## 2014-10-03 ENCOUNTER — Other Ambulatory Visit: Payer: Self-pay | Admitting: Gastroenterology

## 2014-10-04 ENCOUNTER — Ambulatory Visit (INDEPENDENT_AMBULATORY_CARE_PROVIDER_SITE_OTHER): Payer: Managed Care, Other (non HMO) | Admitting: Psychiatry

## 2014-10-04 ENCOUNTER — Encounter (HOSPITAL_COMMUNITY): Payer: Self-pay | Admitting: Psychiatry

## 2014-10-04 VITALS — BP 131/97 | HR 93 | Ht 72.0 in | Wt 179.6 lb

## 2014-10-04 DIAGNOSIS — F319 Bipolar disorder, unspecified: Secondary | ICD-10-CM

## 2014-10-04 MED ORDER — LAMOTRIGINE ER 300 MG PO TB24: 1.0000 | ORAL_TABLET | Freq: Every day | ORAL | Status: DC

## 2014-10-04 MED ORDER — LORAZEPAM 0.5 MG PO TABS: 0.5000 mg | ORAL_TABLET | Freq: Two times a day (BID) | ORAL | Status: DC

## 2014-10-04 MED ORDER — ZOLPIDEM TARTRATE 10 MG PO TABS: 10.0000 mg | ORAL_TABLET | Freq: Every evening | ORAL | Status: DC | PRN

## 2014-10-04 NOTE — Progress Notes (Signed)
[facility] [phone] Progress Note  Alex [name] [id number] 59 y.o.  10/04/2014 11:38 AM  Chief Complaint:  Medication management and followup.      History of Present Illness:  Alex [name] came for his followup appointment.  He is under a lot of stress because of family situation.  His daughter Alex [name] is using heroine .  Patient told he has signed the lease for her appointment but now she is not working and he is very nervous about it.  Patient also reported distress coming from his other daughter .  3 weeks ago he went to visit his oldest daughter who had recently a baby girl and while he was there his son-in-law had embolism and massive hemorrhage that requires ICU hospitalization for 10 days.  Patient told it was very stressful.  His 79 year old son living living at home and sometimes does not take his medication.  His son has been [facility] attempt.  Patient continues to have issues with her wife .  He admitted irritability and anxiety however he does not want to change his medication.  He has cut down his pain medication a lot.  His pain medicine is managed by Dr. Ethlyn Daniels .  Patient is seeing his primary care physician on a regular basis.  He also started seeing Alex [name] this office for counseling.  He is compliant with Lamictal, Ativan and Ambien.  Patient denies any major panic attack but admitted his symptoms are chronic in nature.  He denies any hallucination or any paranoia.  He denies any side effects including any rash or itching with Lamictal.  His appetite is okay.  His vitals are stable.  Suicidal Ideation: No Plan Formed: No Patient has means to carry out plan: No  Homicidal Ideation: No Plan Formed: No Patient has means to carry out plan: No  Review of Systems: Psychiatric: Agitation: No Hallucination: No Depressed Mood: No Insomnia: Yes Hypersomnia: No Altered Concentration: No Feels Worthless: No Grandiose  Ideas: No Belief In Special Powers: No New/Increased Substance Abuse: No Compulsions: No  Neurologic: Headache: Yes Seizure: No Paresthesias: No  Medical  history Patient has history of hypertension , immune deficiency syndrome , GERD , Tick disorder, coronary artery disease, hypertension, COPD, esophagus stricture, lumbago, osteoarthritis, malignant neoplasm of orbit, vertigo, osteoarthritis and chronic fatigue.   Outpatient Encounter Prescriptions as of 10/04/2014  Medication Sig  . aspirin EC 81 MG tablet Take 81 mg by mouth daily.  [name] atorvastatin (LIPITOR) 40 MG tablet Take 1 tablet (40 mg total) by mouth daily.  . Coenzyme Q10 (CO Q 10 PO) Take by mouth daily.  . ferrous sulfate 325 (65 FE) MG tablet Take 1 tablet (325 mg total) by mouth daily.  . hydroxypropyl methylcellulose (ISOPTO TEARS) 2.5 % ophthalmic solution Place 1 drop into both eyes 4 (four) times daily as needed. For dry eyes  . Immune Globulin, Human, (HIZENTRA) 4 GM/20ML SOLN Inject into the skin once a week. wednesday  . LamoTRIgine 300 MG TB24 Take 1 tablet (300 mg total) by mouth daily.  [name] levothyroxine (SYNTHROID) 150 MCG tablet Take 1 tablet (150 mcg total) by mouth daily.  [name] LORazepam (ATIVAN) 0.5 MG tablet Take 1 tablet (0.5 mg total) by mouth 2 (two) times daily.  . OMEGA-3 KRILL OIL PO Take by mouth daily.  [name] oxyCODONE-acetaminophen (PERCOCET) 7.5-325 MG per tablet Take 1 tablet by mouth every 6 (six) hours as needed.  . RABEprazole (ACIPHEX) 20 MG  tablet TAKE 1 TABLET BY MOUTH EVERY DAY--  takes in pm  . sucralfate (CARAFATE) 1 G tablet Take 1 g by mouth 4 (four) times daily.  [name] telmisartan (MICARDIS) 80 MG tablet TAKE 1 TABLET (80 MG TOTAL) BY MOUTH DAILY.--   takes in am  . Testosterone (AXIRON) 30 MG/ACT SOLN Place 2 Act onto the skin every morning.  . zolpidem (AMBIEN) 10 MG tablet Take 1 tablet (10 mg total) by mouth at bedtime as needed. for sleep  . [DISCONTINUED] atorvastatin (LIPITOR) 40 MG tablet  TAKE 1 TABLET BY MOUTH ONCE DAILY-  takes in pm  . [DISCONTINUED] LamoTRIgine 300 MG TB24 TAKE 1 TABLET BY MOUTH ONCE DAILY  . [DISCONTINUED] levothyroxine (SYNTHROID) 150 MCG tablet Take 150 mcg by mouth every evening.  . [DISCONTINUED] LORazepam (ATIVAN) 0.5 MG tablet Take 1 tablet (0.5 mg total) by mouth 2 (two) times daily.  . [DISCONTINUED] multivitamin-lutein (OCUVITE-LUTEIN) CAPS capsule Take 1 capsule by mouth daily.  . [DISCONTINUED] zolpidem (AMBIEN) 10 MG tablet Take 1 tablet (10 mg total) by mouth at bedtime as needed. for sleep    Past Psychiatric History/Hospitalization(s): Patient has been seeing in this office since 2009.  He has history of depression mood swing and anger.  He's been admitted to [facility] due to suicidal thoughts but he denies any suicidal attempt.  He denies any psychosis but admitted poor impulse control.  In the past he has taken Seroquel and Cymbalta.   Anxiety: Yes Bipolar Disorder: Yes Depression: Yes Mania: No Psychosis: No Schizophrenia: No Personality Disorder: No Hospitalization for psychiatric illness: Yes History of Electroconvulsive Shock Therapy: No Prior Suicide Attempts: No  Physical Exam: Constitutional:  BP 131/97 mmHg  Pulse 93  Ht 6' (1.829 m)  Wt 179 lb 9.6 oz (81.466 kg)  BMI 24.35 kg/m2  General Appearance: alert, oriented, no acute distress  Musculoskeletal: Strength & Muscle Tone: within normal limits Gait & Station: normal Patient leans: N/A  ROS No results found for this or any previous visit (from the past 2160 hour(s)).  Mental status examination Patient is casually dressed and fairly groomed.  He is anxious but maintained a good eye contact.  He described his mood is euthymic and his affect is mood appropriate.  He denies any active or passive suicidal thoughts or homicidal thoughts.  He denies any auditory or visual hallucination.  He is restless but there were no tremors or shakes present.   His  thought process is logical linear and goal-directed.  There were no flight of ideas present at this time.  His fund of knowledge is adequate.  His attention and concentration is okay.  He's alert and oriented x3.  His insight judgment and impulse control is okay.   Established Problem, Stable/Improving (1), Review of Psycho-Social Stressors (1), Review of Last Therapy Session (1) and Review of Medication Regimen & Side Effects (2)  Assessment: Axis I:  bipolar disorder1  Axis II:  deferred  Axis III:  see medical history   Patient Active Problem List   Diagnosis Date Noted  . Cervical disc disorder with radiculopathy of cervical region 06/06/2014  . Right elbow pain 06/06/2014  . Elevated WBC count 06/06/2014  . Iron deficiency anemia 06/06/2014  . Pre-operative exam 04/07/2014  . Chronic right SI joint pain 02/21/2014  . Edema 02/21/2014  . Tinnitus 02/21/2014  . Well adult exam 05/31/2013  . Glaucoma 05/31/2013  . RML pneumonia 03/31/2013  . Fever, unspecified 03/26/2013  . Hypertension, uncontrolled  02/25/2013  . Preop exam for internal medicine 04/02/2012  . Ingrowing toenail with infection 04/02/2012  . Localized osteoarthritis of left knee 04/02/2012  . Vertigo 01/05/2012  . Ataxia 01/05/2012  . Anxiety state 08/08/2010  . COPD 11/30/2009  . ESOPHAGEAL STRICTURE 11/30/2009  . CHANGE IN BOWELS 11/30/2009  . Major depression, chronic 08/07/2009  . Osteoarthritis 08/07/2009  . GENERALIZED OSTEOARTHROSIS UNSPECIFIED SITE 10/20/2008  . HYPERLIPIDEMIA-MIXED 10/19/2008  . FOOT PAIN 07/11/2008  . EPIDYDIMITIS/ORCHITIS 05/24/2008  . FATIGUE 05/14/2008  . RASH-NONVESICULAR 05/14/2008  . Cough 03/29/2008  . CORONARY ATHEROSCLEROSIS 11/04/2007  . Lumbago 11/04/2007  . THYROIDECTOMY, HX OF 11/04/2007  . 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 NEOPLASM, MALIGNANT, THYROID GLAND 10/02/2007  . 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  IMPAIRED GLUCOSE TOLERANCE 10/02/2007    Plan:  Patient is fairly stable despite a lot of psychosocial issues.  He does not want to change his medication.  He has cut down his pain medication .  He was to continue Lamictal 300 mg daily , Ativan 0.5 mg twice a day as needed and Ambien 10 mg half to one tablet as needed for insomnia.  Discussed meditation side effects and benefits.  Recommended to keep appointment with Alex [name] for counseling.  Patient has no rash with Lamictal.  I will see him again in 3 months.  Chrisha Vogel T., MD 10/04/2014

## 2014-10-05 ENCOUNTER — Ambulatory Visit (HOSPITAL_COMMUNITY): Payer: Self-pay | Admitting: Psychiatry

## 2014-10-13 ENCOUNTER — Other Ambulatory Visit: Payer: Self-pay

## 2014-10-13 ENCOUNTER — Telehealth: Payer: Self-pay | Admitting: *Deleted

## 2014-10-13 MED ORDER — TELMISARTAN 80 MG PO TABS: ORAL_TABLET | ORAL | Status: DC

## 2014-10-13 NOTE — Telephone Encounter (Signed)
POF sent to reschedule iron infusion

## 2014-10-13 NOTE — Telephone Encounter (Signed)
Patient to inquire whether he should feel well enough to travel on Monday March 7th after receiving Feraheme on March 4th.  Assured him that he should be fine to travel.  He is also going to be out of town on March 11th and would like to move that infusion appointment to March 16th.

## 2014-10-14 ENCOUNTER — Telehealth: Payer: Self-pay | Admitting: Oncology

## 2014-10-14 ENCOUNTER — Ambulatory Visit (HOSPITAL_BASED_OUTPATIENT_CLINIC_OR_DEPARTMENT_OTHER): Payer: Managed Care, Other (non HMO) | Admitting: Oncology

## 2014-10-14 ENCOUNTER — Ambulatory Visit: Payer: Self-pay

## 2014-10-14 VITALS — BP 152/96 | HR 82 | Temp 97.9°F | Resp 18 | Ht 72.0 in | Wt 181.4 lb

## 2014-10-14 DIAGNOSIS — K2970 Gastritis, unspecified, without bleeding: Secondary | ICD-10-CM

## 2014-10-14 DIAGNOSIS — R591 Generalized enlarged lymph nodes: Secondary | ICD-10-CM

## 2014-10-14 DIAGNOSIS — M25511 Pain in right shoulder: Secondary | ICD-10-CM

## 2014-10-14 DIAGNOSIS — R599 Enlarged lymph nodes, unspecified: Secondary | ICD-10-CM

## 2014-10-14 DIAGNOSIS — Z85850 Personal history of malignant neoplasm of thyroid: Secondary | ICD-10-CM

## 2014-10-14 DIAGNOSIS — D509 Iron deficiency anemia, unspecified: Secondary | ICD-10-CM

## 2014-10-14 DIAGNOSIS — M542 Cervicalgia: Secondary | ICD-10-CM

## 2014-10-14 DIAGNOSIS — Z8589 Personal history of malignant neoplasm of other organs and systems: Secondary | ICD-10-CM

## 2014-10-14 DIAGNOSIS — K209 Esophagitis, unspecified: Secondary | ICD-10-CM

## 2014-10-14 NOTE — Progress Notes (Signed)
[place]  [phone] [phone]     ID: Alex [name] DOB: 06/23/1956  MR#: [id number]  PXT#:[id number]  Patient Care Team: Cassandria Anger, MD as PCP - General Chauncey Cruel, MD (Hematology and Oncology) Jorja Loa, MD as Attending Physician (Urology) Jerrell Belfast, MD (Otolaryngology) Lelon Perla, MD (Cardiology) Winfield Cunas., MD as Consulting Physician (Gastroenterology) OTHER MD:  CHIEF COMPLAINT: Periorbital squamous cell carcinoma; thyroid cancer; iron deficiency anemia  CURRENT TREATMENT: Observation   INTERVAL HISTORY: Alex [name] returns to the cancer clinic after an interval of 3 years. He continues under close observation and management of his multiple comorbidities by Dr. Alain Marion. The trigger for his return to this clinic is aren't deficiency secondary to persistent esophagitis. He has a history of prior neck iradiation remotely and has had multiple EGD procedures (more than 20) with no carcinoma documented, but evidence of gastritis and esophagitis. This is the apparent cause of his and deficiency, documented as a drop in his MCV (from a baseline of 79-81 to the current 60.7. His hemoglobin has been as low as 9.5 (06/16/2014). He continues on oral iron supplementation, which is not resolving the issue. Accordingly he is being referred for consideration of intravenous iron supplementation  REVIEW OF SYSTEMS: Alex [name]'s review of system remains diffusely positive. He is very concerned that he is having increasing dryness of the left eye and he wonders if this means there is recurrence of his periorbital carcinoma. He feels short of breath, has a dry cough, and this raises concerns to him regarding possible spread to the lung. He has significant discomfort in the right neck and right shoulder area, which is of course worried he had a lymphadenectomy. It is difficult for him to tell whether the discomfort is new or ongoing  but worse. He has frequent headaches. He has some nausea and vomiting problems. He has difficulty concentrating. He feels dizzy at times. He has mild nausea, but denies vomiting at present. He has aches and pains here and there but nothing focal or more persistent or intense than usual. A detailed review of systems today was otherwise stable  PAST MEDICAL HISTORY: Past Medical History  Diagnosis Date  . Anxiety   . Hypertension   . Hyperlipidemia   . Immune deficiency disorder     COMMON VARIABLE  . GERD (gastroesophageal reflux disease)   . Glaucoma BOTH EYES -- NO DROPS   . History of thyroid cancer PRIMARY (NO METS FROM ORBITAL CANCER)--   IN REMISSION    S/P TOTAL THYROIDECTOMY  , CHEMORADIATION  (ONCOLOGIST -- DR Griffith Citron)  . 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 Renal calculi LEFT KIDNEY-- NON-OBSTRUCTIVE  . Depression   . Bipolar I disorder   . Complication of anesthesia POST URINARY RETENTION---  2006 SHOULDER SURGERY MARKED BRADYCARDIA VAGAL RESPONSE NO ISSUE W/ SURGERY AFTER THIS ONE  . BPH (benign prostatic hypertrophy) with urinary obstruction   . Positional vertigo   . Coronary atherosclerosis CARDIOLOGIST- DR CRENSHAW--  LAST VISIT 01-05-2012 IN [facility]    NON-OBSTRUCTIVE MILD DISEASE  . Urinary hesitancy   . Epicondylitis     right elbow  . Ulnar nerve compression     right elbow  . Chronic back pain   .  OA (osteoarthritis)   . Nocturia   . History of chronic prostatitis   . At risk for sleep apnea     STOP-BANG= 5    SENT TO PCP 04-22-2014  . Bleeding ulcer 06/2014    PAST SURGICAL HISTORY: Past Surgical History  Procedure Laterality Date  . Occuloplastic surgery  2002  . Total thyroidectomy  11-03-2001    PAPILLARY THYROID CARCINOMA  . Right supraomohyoid neck dissection   03-08-2003    ZONES  1,2,3;   SUBMANDIBULAR MASS / METASTATIC SQUAMOUS CELL CARCINOMA RIGHT NECK  . Pars plana vitrectomy  11-06-2004    RIGHT EYE RADIATION RETINOPATHY W/ HEMORRHAGE  . Left hydrocelectomy  03-29-2005    AND REPAIR LEFT INGUINAL HERNIA W/ MESH  . Nasal endoscopy  08-07-2005    RIGHT EPISTAXIS  / POST SEPTOPLASTY  (HX RIGHT ORBITAL CA & S/P RADIATION/ NECROSIS ANTERIOR END OF BOTH INFERIOR TURBINATES)  . Cardiac catheterization  01-16-2006  DR THOMAS WALL    MILD CORONARY ATHEROSCLEROSIS/ MID TO DISTAL LAD 40% STENOSIS/ LVF 50-55%  . Shoulder arthroscopy w/ subacromial decompression and distal clavicle excision  10-09-2008    AND DEBRIDEMENT OF RIGHT SHOULDER IMPINGEMENT & [facility] JOINT ARTHRITIS  . Right ankle arthroscopy w/ extensive debridement  04-05-2008   . Left ankle arthroscopy w/ debridement  05-12-2007  . Mohs surgery  2002    RIGHT ORBITAL CANCER  . Repair undesended right testicle / right inguinal hernia  AGE 6  . Septoplasty  NOV 2006  . Knee arthroscopy  05/01/2012    Procedure: ARTHROSCOPY KNEE;  Surgeon: Johnn Hai, MD;  Location: [facility];  Service: Orthopedics;  Laterality: Left;  debridement and removal of loose body  . Transthoracic echocardiogram  12/ 2012    grade I diastolic dysfunction/ ef 30-16%  . Ulnar nerve transposition Right 04/28/2014    Procedure: RIGHT ELBOW ULNA NERVE RELEASE TRANSPOSTION AND MEDIAL EPICONDYLAR DEBRIDEMENT AND REPAIR;  Surgeon: Linna Hoff, MD;  Location: [place];  Service: Orthopedics;  Laterality: Right;    FAMILY HISTORY Family History  Problem Relation Age of Onset  . Depression Sister   . Depression Daughter   . Drug abuse Daughter     SOCIAL HISTORY:  Alex [name] is disabled secondary to his multiple medical problems. His wife Marcie Bal works for [employer]. 4 of his children are still home, one still in school and the other looking for a job. The other 2 children have significant social  problems.    ADVANCED DIRECTIVES: Not in place   HEALTH MAINTENANCE: History  Substance Use Topics  . Smoking status: Never Smoker   . Smokeless tobacco: Never Used  . Alcohol Use: No     Allergies  Allergen Reactions  . Percocet [Oxycodone-Acetaminophen] Itching    Can take generic.  States only has a problem with percocet brand    Current Outpatient Prescriptions  Medication Sig Dispense Refill  . aspirin EC 81 MG tablet Take 81 mg by mouth daily.    [name] atorvastatin (LIPITOR) 40 MG tablet Take 1 tablet (40 mg total) by mouth daily. 90 tablet 2  . Coenzyme Q10 (CO Q 10 PO) Take by mouth daily.    . ferrous sulfate 325 (65 FE) MG tablet Take 1 tablet (325 mg total) by mouth daily. 30 tablet 3  . hydroxypropyl methylcellulose (ISOPTO TEARS) 2.5 % ophthalmic solution Place 1 drop into both eyes 4 (four) times daily as needed. For dry eyes    .  Immune Globulin, Human, (HIZENTRA) 4 GM/20ML SOLN Inject into the skin once a week. wednesday    . LamoTRIgine 300 MG TB24 Take 1 tablet (300 mg total) by mouth daily. 90 tablet 0  . levothyroxine (SYNTHROID) 150 MCG tablet Take 1 tablet (150 mcg total) by mouth daily. 90 tablet 3  . LORazepam (ATIVAN) 0.5 MG tablet Take 1 tablet (0.5 mg total) by mouth 2 (two) times daily. 60 tablet 2  . OMEGA-3 KRILL OIL PO Take by mouth daily.    [name] oxyCODONE-acetaminophen (PERCOCET) 7.5-325 MG per tablet Take 1 tablet by mouth every 6 (six) hours as needed.  0  . RABEprazole (ACIPHEX) 20 MG tablet TAKE 1 TABLET BY MOUTH EVERY DAY--  takes in pm    . sucralfate (CARAFATE) 1 G tablet Take 1 g by mouth 4 (four) times daily.  3  . telmisartan (MICARDIS) 80 MG tablet TAKE 1 TABLET (80 MG TOTAL) BY MOUTH DAILY.--   takes in am 30 tablet 0  . Testosterone (AXIRON) 30 MG/ACT SOLN Place 2 Act onto the skin every morning.    . zolpidem (AMBIEN) 10 MG tablet Take 1 tablet (10 mg total) by mouth at bedtime as needed. for sleep 30 tablet 0   No current  facility-administered medications for this visit.    OBJECTIVE: Middle-aged white male who appears stated age 69 Vitals:   10/14/14 1325  BP: 152/96  Pulse: 82  Temp: 97.9 F (36.6 C)  Resp: 18     Body mass index is 24.6 kg/(m^2).    ECOG FS:1 - Symptomatic but completely ambulatory  Ocular: Sclerae unicteric, pupils round and equal, EOMs intact, mild nystagmus to left lateral gaze Ear-nose-throat: Oropharynx clear and moist Lymphatic: No cervical or supraclavicular adenopathy palpated Lungs no rales or rhonchi, good excursion bilaterally Heart regular rate and rhythm, no murmur appreciated Abd soft, nontender, positive bowel sounds MSK no focal spinal tenderness Neuro: non-focal, well-oriented, anxious affect   LAB RESULTS:  CMP     Component Value Date/Time   NA 134* 06/06/2014 1455   K 3.5 06/06/2014 1455   CL 101 06/06/2014 1455   CO2 23 06/06/2014 1455   GLUCOSE 160* 06/06/2014 1455   GLUCOSE 95 06/04/2006 0825   BUN 23 06/06/2014 1455   CREATININE 1.1 06/06/2014 1455   CALCIUM 7.9* 06/06/2014 1455   PROT 6.8 06/06/2014 1455   ALBUMIN 3.7 06/06/2014 1455   AST 25 06/06/2014 1455   ALT 29 06/06/2014 1455   ALKPHOS 71 06/06/2014 1455   BILITOT 0.8 06/06/2014 1455   GFRNONAA 79* 01/05/2012 0131   GFRAA >90 01/05/2012 0131    INo results found for: SPEP, UPEP  Lab Results  Component Value Date   WBC 8.9 07/05/2014   NEUTROABS 5.5 07/05/2014   HGB 10.2* 07/05/2014   HCT 34.0* 07/05/2014   MCV 68.0* 07/05/2014   PLT 386 07/05/2014      Chemistry      Component Value Date/Time   NA 134* 06/06/2014 1455   K 3.5 06/06/2014 1455   CL 101 06/06/2014 1455   CO2 23 06/06/2014 1455   BUN 23 06/06/2014 1455   CREATININE 1.1 06/06/2014 1455      Component Value Date/Time   CALCIUM 7.9* 06/06/2014 1455   ALKPHOS 71 06/06/2014 1455   AST 25 06/06/2014 1455   ALT 29 06/06/2014 1455   BILITOT 0.8 06/06/2014 1455       No results found for:  LABCA2  No components found for:  NUUVO536  No results for input(s): INR in the last 168 hours.  Urinalysis    Component Value Date/Time   COLORURINE YELLOW 05/27/2013 1206   APPEARANCEUR CLEAR 05/27/2013 1206   LABSPEC >=1.030 05/27/2013 1206   PHURINE 5.5 05/27/2013 1206   GLUCOSEU NEGATIVE 05/27/2013 1206   GLUCOSEU NEGATIVE 01/05/2012 0256   HGBUR TRACE-LYSED 05/27/2013 [place] 05/27/2013 [place] 05/27/2013 1206   PROTEINUR NEGATIVE 01/05/2012 0256   UROBILINOGEN 0.2 05/27/2013 1206   NITRITE NEGATIVE 05/27/2013 [place] 05/27/2013 1206    STUDIES: Dg Esophagus  09/28/2014   CLINICAL DATA:  Dysphagia, history of head and neck carcinoma with resection and radiation  EXAM: ESOPHOGRAM/BARIUM SWALLOW  TECHNIQUE: Single contrast examination was performed using  thin barium.  FLUOROSCOPY TIME:  2 minutes 18 seconds, 55 Gy per sq cm  COMPARISON:  None.  FINDINGS: Initially rapid sequence spot films of the cervical esophagus were performed. There is persistent narrowing of the lower cervical esophagus over short segment. This could be related to prior radiation and subsequent scarring as well as some impingement by prominent cricopharyngeus muscle noted on the lateral view. The more distal esophageal peristalsis is unremarkable. No hiatal hernia is seen. No reflux could be demonstrated. A barium pill was given at the end of the study which did pass through the lower cervical esophageal narrowing and into the stomach without significant delay  IMPRESSION: 1. Short segment narrowing of the lower cervical esophagus could be related to radiation and scarring as well as prominent cricopharyngeus muscle. However, the barium pill does pass beyond this point and into the stomach without delay. 2. No gastroesophageal reflux could be demonstrated.   Electronically Signed   By: Ivar Drape [name]   On: 09/28/2014 12:10    ASSESSMENT: 59 y.o.  [place] man  (1) status post right periorbital biopsy of the squamous cell carcinoma in November 2002, status post resection and 64 gray of radiation completed February 2003  (2) recurrence to the right perirbital lymph node drainage area, status post right supraomohyoid neck dissection July 2004 with 1 of 8 lymph nodes positive  (a) completed adjuvant radiation October 2004, 49 Gy to the upper neck, given with sensitizing Erbitux and cisplatin  (3) status post thyroidectomy for papillary thyroid carcinoma, with postoperative iodine-131 May 2003  (a) on chronic Synthroid supplementation, most recent TSH 1.73 (06/06/2014)  (4) 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 His anxiety and multiple somatic complaints continues to result in significant disability. As far as his iron deficiency is concerned we certainly can proceed to intravenous iron supplementation, which I think will take care of his anemia problem. I would not expect the MCV to rise above 81 given review of prior records (he is almost certainly missing one of the 4 alpha hemoglobin genes, resulting in thalassemia trait). His hemoglobin, however, should normalize.  Quite aside from this issue, Alex [name] is very concerned that he may be having periorbital tumor recurrence, or neck recurrence from his prior thyroid cancer, or spread to his lungs. While this seems unlikely at this point, it is certainly not impossible. Accordingly I have set him up for some scans to clarify these questions. I expect him to be negative or better stated unchanged from prior.  He will receive  iron supplementation 3/7 and 3/11. He has a good understanding of the possible toxicities, side effects and complications of Feraheme, which is however generally well  tolerated. I will not follow his lab work routinely, since she gets frequent lab work through his primary care physician and other doctors. I will however see him again in one year for routine review of his situation.  Fara Olden has a good understanding of this plan. He agrees with it. He knows to call for any problems that may develop before the next visit here.   Chauncey Cruel, MD   10/15/2014 10:22 AM Medical Oncology and Hematology [facility] [address] [place],  [address] Tel. [phone]    Fax. [phone]

## 2014-10-14 NOTE — Telephone Encounter (Signed)
appts made and avs printed for pt °

## 2014-10-17 ENCOUNTER — Ambulatory Visit (HOSPITAL_BASED_OUTPATIENT_CLINIC_OR_DEPARTMENT_OTHER): Payer: Managed Care, Other (non HMO)

## 2014-10-17 DIAGNOSIS — D509 Iron deficiency anemia, unspecified: Secondary | ICD-10-CM

## 2014-10-17 MED ORDER — SODIUM CHLORIDE 0.9 % IV SOLN
510.0000 mg | Freq: Once | INTRAVENOUS | Status: AC
  Administered 2014-10-17: 510 mg via INTRAVENOUS
  Filled 2014-10-17: qty 17

## 2014-10-17 NOTE — Patient Instructions (Signed)

## 2014-10-18 ENCOUNTER — Ambulatory Visit (HOSPITAL_COMMUNITY): Admission: RE | Admit: 2014-10-18 | Payer: Managed Care, Other (non HMO) | Source: Ambulatory Visit

## 2014-10-19 ENCOUNTER — Telehealth: Payer: Self-pay | Admitting: *Deleted

## 2014-10-19 NOTE — Telephone Encounter (Signed)
Patient called stating he wss having burning/pain with urination and questioned if it could be a reaction. Patient received feraheme 10/17/14. Instructed patient to call PCP. Patient verbalized understanding.

## 2014-10-21 ENCOUNTER — Ambulatory Visit: Payer: Self-pay

## 2014-10-24 ENCOUNTER — Ambulatory Visit (HOSPITAL_COMMUNITY): Payer: Self-pay | Admitting: Psychiatry

## 2014-10-27 ENCOUNTER — Other Ambulatory Visit (HOSPITAL_COMMUNITY): Payer: Self-pay

## 2014-10-27 ENCOUNTER — Ambulatory Visit (HOSPITAL_BASED_OUTPATIENT_CLINIC_OR_DEPARTMENT_OTHER): Payer: Managed Care, Other (non HMO)

## 2014-10-27 DIAGNOSIS — D509 Iron deficiency anemia, unspecified: Secondary | ICD-10-CM

## 2014-10-27 MED ORDER — SODIUM CHLORIDE 0.9 % IV SOLN
510.0000 mg | Freq: Once | INTRAVENOUS | Status: AC
  Administered 2014-10-27: 510 mg via INTRAVENOUS
  Filled 2014-10-27: qty 17

## 2014-10-27 MED ORDER — SODIUM CHLORIDE 0.9 % IV SOLN
Freq: Once | INTRAVENOUS | Status: AC
  Administered 2014-10-27: 15:00:00 via INTRAVENOUS

## 2014-10-27 NOTE — Patient Instructions (Signed)

## 2014-10-28 ENCOUNTER — Ambulatory Visit
Admission: RE | Admit: 2014-10-28 | Discharge: 2014-10-28 | Disposition: A | Discharge status: Home / Self Care | Payer: Managed Care, Other (non HMO) | Source: Ambulatory Visit | Attending: Oncology | Admitting: Oncology

## 2014-10-28 ENCOUNTER — Other Ambulatory Visit: Payer: Self-pay | Admitting: *Deleted

## 2014-10-28 ENCOUNTER — Telehealth: Payer: Self-pay | Admitting: Cardiology

## 2014-10-28 DIAGNOSIS — R599 Enlarged lymph nodes, unspecified: Secondary | ICD-10-CM

## 2014-10-28 DIAGNOSIS — R591 Generalized enlarged lymph nodes: Secondary | ICD-10-CM

## 2014-10-28 MED ORDER — IOPAMIDOL (ISOVUE-300) INJECTION 61%
75.0000 mL | Freq: Once | INTRAVENOUS | Status: AC | PRN
  Administered 2014-10-28: 75 mL via INTRAVENOUS

## 2014-10-28 MED ORDER — TELMISARTAN 80 MG PO TABS: ORAL_TABLET | ORAL | Status: DC

## 2014-10-28 MED ORDER — GADOBENATE DIMEGLUMINE 529 MG/ML IV SOLN
17.0000 mL | Freq: Once | INTRAVENOUS | Status: AC | PRN
  Administered 2014-10-28: 17 mL via INTRAVENOUS

## 2014-10-28 NOTE — Telephone Encounter (Signed)
Spoke with pt, Follow up scheduled  

## 2014-10-28 NOTE — Telephone Encounter (Signed)
New message      Pt want to know when is he due for another follow up?  Also, he want it noted in his chart to refill his micardis 80mg  next time with a 90day supply.

## 2014-10-31 ENCOUNTER — Other Ambulatory Visit: Payer: Self-pay | Admitting: Oncology

## 2014-10-31 NOTE — Progress Notes (Unsigned)
I called Alex [name] and gave him the news of his scans, which show no evidence of active cancer.

## 2014-11-07 ENCOUNTER — Ambulatory Visit (HOSPITAL_COMMUNITY): Payer: Managed Care, Other (non HMO) | Admitting: Psychiatry

## 2014-11-07 DIAGNOSIS — F319 Bipolar disorder, unspecified: Secondary | ICD-10-CM | POA: Diagnosis not present

## 2014-11-07 NOTE — Progress Notes (Signed)
   THERAPIST PROGRESS NOTE  Session Time: 2:00-3:00  Participation Level: Active   Behavioral Response: Well GroomedAlertAnxiousEuthymic  Type of Therapy: Individual Therapy   Treatment Goals addressed: Coping  Interventions: CBT, Motivational Interviewing, Strength-based, Supportive and Reframing   Summary: Pt. Presented with much improved mood compared to last session. Pt. Reported that he continues to be cancer free and he was encouraged that recent assessments of throat and underarm masses were clear. Pt. Reported that relationship with his wife has improved which he attributed to being assertive in planning more time with her. Pt. Reported that he went away with his wife for weekend to celebrate anniversary and a separate trip to the beach for a few days. Pt. Reported that he had an encouraging conversation with his wife's friend who he has considered as interfering in his relationship with his wife who solicited his opinion about planning for daughter who is substance dependent. Pt. Reports that his son in college is doing well and youngest daughter is doing well. Pt. Reported that he is also encouraged that son with mental illness has found new providers who he believes will be a better fit. Pt. Reported that he has greater commitment to self-care as evidenced by plans to garden for the summer and hopes that his family will contribute but is accepting of the fact that they may not be involved. Therapist observed that Pt.'s mood was noticably brighter, smiled appropriately and made good eye contact.   Suicidal/Homicidal: Nowithout intent/plan   Therapist Response: Assessed patients current functioning and reviewed progress. Assisted patient with the expression of anger and loneliness. Made referral to mental health association for socialization.   Plan: Continue with CBT based therapy. Return again in two weeks.   Diagnosis: Axis I: Bipolar, Depressed  Axis II: No  diagnosis   Nancie Neas, [facility] 11/07/2014

## 2014-11-21 ENCOUNTER — Ambulatory Visit (HOSPITAL_COMMUNITY): Payer: Self-pay | Admitting: Psychiatry

## 2014-12-01 ENCOUNTER — Other Ambulatory Visit (HOSPITAL_COMMUNITY): Payer: Self-pay | Admitting: Psychiatry

## 2014-12-01 DIAGNOSIS — F319 Bipolar disorder, unspecified: Secondary | ICD-10-CM

## 2014-12-02 NOTE — Telephone Encounter (Signed)
Met with Dr. Adele Schilder who authorized one time refill of patient's requested Ambien.  Called in order to [facility] with Gunnar Fusi on [address] after order printed by mistake.  Printed order destroyed and called patient to inform the [facility] on [address].  Patient to return to see Dr. Adele Schilder on 01/03/15.

## 2014-12-06 ENCOUNTER — Ambulatory Visit (HOSPITAL_COMMUNITY): Payer: Self-pay | Admitting: Psychiatry

## 2014-12-07 ENCOUNTER — Ambulatory Visit (INDEPENDENT_AMBULATORY_CARE_PROVIDER_SITE_OTHER): Payer: Managed Care, Other (non HMO) | Admitting: Psychiatry

## 2014-12-07 DIAGNOSIS — F319 Bipolar disorder, unspecified: Secondary | ICD-10-CM

## 2014-12-07 NOTE — Progress Notes (Signed)
   THERAPIST PROGRESS NOTE  Session Time: 2:30-3:30  Participation Level: Active   Behavioral Response: Well GroomedAlertAnxiousEuthymic  Type of Therapy: Individual Therapy   Treatment Goals addressed: Coping  Interventions: CBT, Motivational Interviewing, Strength-based, Supportive and Reframing   Summary: Pt. Continues to present with improved mood, talkative, makes appropriate eye contact. Pt. Continues to report multiple significant stressors including daughters chemical dependence and recently totaled car and is drain on family's financial resources, son who has chronic mental health disorder and self-harms. Pt. Reports that "no light at the end of the tunnel" regarding treatment of son's mental illness. Pt. Reports compassion fatigue in regards to his son with multiple unsuccessful attempts to find effective treatment. Pt. Reports concern for youngest daughter who has experienced burden of multiple siblings with chronic disorders. Pt. Reports that relationship with his wife is "stable" and that they are not arguing, although he often feels sad related to remarks that he perceives as insensitive from her. Pt. Reports that his recent cancer screens have been clear, but that he has concern about prostate inflammation that his doctor says does not warrant treatment at this time. Pt. Was encouraged to stay in the present, continue to practice acceptance based cognitions towards people and events that are outside of his control.   Suicidal/Homicidal: Nowithout intent/plan   Therapist Response: Assessed patients current functioning and reviewed progress. Assisted patient with the expression of anger and loneliness. Made referral to mental health association for socialization.   Plan: Continue with CBT based therapy. Return again in two weeks.   Diagnosis: Axis I: Bipolar, Depressed  Axis II: No diagnosis    Nancie Neas, [facility] 12/07/2014

## 2014-12-08 ENCOUNTER — Telehealth: Payer: Self-pay | Admitting: Oncology

## 2014-12-08 NOTE — Telephone Encounter (Signed)
Faxed pt medical records to [facility]

## 2014-12-09 NOTE — Progress Notes (Signed)
HPI: FU CAD. Cath in 2007 showed a 40 LAD; EF 50-55. Had normal ETT 7/11 with 10:00 on [place]. Echocardiogram December 2012 showed normal LV function and grade 1 diastolic dysfunction. Holter monitor in January 2014 showed sinus rhythm, PACs, PVCs and brief PAT. Since he was last seen, the patient denies any dyspnea on exertion, orthopnea, PND, pedal edema, palpitations, syncope or chest pain.   Current Outpatient Prescriptions  Medication Sig Dispense Refill  . atorvastatin (LIPITOR) 40 MG tablet Take 1 tablet (40 mg total) by mouth daily. 90 tablet 2  . Coenzyme Q10 (CO Q 10 PO) Take by mouth daily.    . fluconazole (DIFLUCAN) 150 MG tablet     . Immune Globulin, Human, (HIZENTRA) 4 GM/20ML SOLN Inject into the skin once a week. wednesday    . LamoTRIgine 300 MG TB24 Take 1 tablet (300 mg total) by mouth daily. 90 tablet 0  . levothyroxine (SYNTHROID) 150 MCG tablet Take 1 tablet (150 mcg total) by mouth daily. 90 tablet 3  . LORazepam (ATIVAN) 0.5 MG tablet Take 1 tablet (0.5 mg total) by mouth 2 (two) times daily. 60 tablet 2  . OMEGA-3 KRILL OIL PO Take by mouth daily.    [name] oxyCODONE-acetaminophen (PERCOCET) 7.5-325 MG per tablet Take 1 tablet by mouth every 6 (six) hours as needed.  0  . RABEprazole (ACIPHEX) 20 MG tablet TAKE 1 TABLET BY MOUTH EVERY DAY--  takes in pm    . sucralfate (CARAFATE) 1 G tablet Take 1 g by mouth 4 (four) times daily.  3  . telmisartan (MICARDIS) 80 MG tablet TAKE 1 TABLET (80 MG TOTAL) BY MOUTH DAILY.--   takes in am 90 tablet 3  . Testosterone 10 MG/ACT (2%) GEL     . zolpidem (AMBIEN) 10 MG tablet TAKE 1 TABLET BY MOUTH AT BEDTIME AS NEEDED FOR SLEEP 30 tablet 0   No current facility-administered medications for this visit.     Past Medical History  Diagnosis Date  . Anxiety   . Hypertension   . Hyperlipidemia   . Immune deficiency disorder     COMMON VARIABLE  . GERD (gastroesophageal reflux disease)   . Glaucoma BOTH EYES -- NO DROPS     . History of thyroid cancer PRIMARY (NO METS FROM ORBITAL CANCER)--   IN REMISSION    S/P TOTAL THYROIDECTOMY  , CHEMORADIATION  (ONCOLOGIST -- DR Griffith Citron)  . 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 Renal calculi LEFT KIDNEY-- NON-OBSTRUCTIVE  . Depression   . Bipolar I disorder   . Complication of anesthesia POST URINARY RETENTION---  2006 SHOULDER SURGERY MARKED BRADYCARDIA VAGAL RESPONSE NO ISSUE W/ SURGERY AFTER THIS ONE  . BPH (benign prostatic hypertrophy) with urinary obstruction   . Positional vertigo   . Coronary atherosclerosis CARDIOLOGIST- DR Sloane Junkin--  LAST VISIT 01-05-2012 IN [facility]    NON-OBSTRUCTIVE MILD DISEASE  . Urinary hesitancy   . Epicondylitis     right elbow  . Ulnar nerve compression     right elbow  . Chronic back pain   . OA (osteoarthritis)   . Nocturia   . History of chronic prostatitis   . At risk for sleep apnea     STOP-BANG= 5  SENT TO PCP 04-22-2014  . Bleeding ulcer 06/2014    Past Surgical History  Procedure Laterality Date  . Occuloplastic surgery  2002  . Total thyroidectomy  11-03-2001    PAPILLARY THYROID CARCINOMA  . Right supraomohyoid neck dissection   03-08-2003    ZONES 1,2,3;   SUBMANDIBULAR MASS / METASTATIC SQUAMOUS CELL CARCINOMA RIGHT NECK  . Pars plana vitrectomy  11-06-2004    RIGHT EYE RADIATION RETINOPATHY W/ HEMORRHAGE  . Left hydrocelectomy  03-29-2005    AND REPAIR LEFT INGUINAL HERNIA W/ MESH  . Nasal endoscopy  08-07-2005    RIGHT EPISTAXIS  / POST SEPTOPLASTY  (HX RIGHT ORBITAL CA & S/P RADIATION/ NECROSIS ANTERIOR END OF BOTH INFERIOR TURBINATES)  . Cardiac catheterization  01-16-2006  DR THOMAS WALL    MILD CORONARY ATHEROSCLEROSIS/ MID TO DISTAL LAD 40% STENOSIS/ LVF 50-55%  . Shoulder arthroscopy w/ subacromial decompression and distal clavicle  excision  10-09-2008    AND DEBRIDEMENT OF RIGHT SHOULDER IMPINGEMENT & [facility] JOINT ARTHRITIS  . Right ankle arthroscopy w/ extensive debridement  04-05-2008   . Left ankle arthroscopy w/ debridement  05-12-2007  . Mohs surgery  2002    RIGHT ORBITAL CANCER  . Repair undesended right testicle / right inguinal hernia  AGE 59  . Septoplasty  NOV 2006  . Knee arthroscopy  05/01/2012    Procedure: ARTHROSCOPY KNEE;  Surgeon: Johnn Hai, MD;  Location: [facility];  Service: Orthopedics;  Laterality: Left;  debridement and removal of loose body  . Transthoracic echocardiogram  12/ 2012    grade I diastolic dysfunction/ ef 46-65%  . Ulnar nerve transposition Right 04/28/2014    Procedure: RIGHT ELBOW ULNA NERVE RELEASE TRANSPOSTION AND MEDIAL EPICONDYLAR DEBRIDEMENT AND REPAIR;  Surgeon: Linna Hoff, MD;  Location: [place];  Service: Orthopedics;  Laterality: Right;    History   Social History  . Marital Status: Married    Spouse Name: N/A  . Number of Children: N/A  . Years of Education: N/A   Occupational History  . Not on file.   Social History Main Topics  . Smoking status: Never Smoker   . Smokeless tobacco: Never Used  . Alcohol Use: No  . Drug Use: No  . Sexual Activity: Not on file   Other Topics Concern  . Not on file   Social History Narrative    ROS: no fevers or chills, productive cough, hemoptysis, dysphasia, odynophagia, melena, hematochezia, dysuria, hematuria, rash, seizure activity, orthopnea, PND, pedal edema, claudication. Remaining systems are negative.  Physical Exam: Well-developed well-nourished in no acute distress.  Skin is warm and dry.  HEENT is normal.  Neck is supple.  Chest is clear to auscultation with normal expansion.  Cardiovascular exam is regular rate and rhythm.  Abdominal exam nontender or distended. No masses palpated. Extremities show no edema. neuro grossly intact  ECG sinus rhythm with no ST  changes.

## 2014-12-13 ENCOUNTER — Encounter: Payer: Self-pay | Admitting: Cardiology

## 2014-12-13 ENCOUNTER — Ambulatory Visit (INDEPENDENT_AMBULATORY_CARE_PROVIDER_SITE_OTHER): Payer: Managed Care, Other (non HMO) | Admitting: Cardiology

## 2014-12-13 VITALS — BP 162/110 | HR 65 | Ht 72.0 in | Wt 177.7 lb

## 2014-12-13 DIAGNOSIS — I10 Essential (primary) hypertension: Secondary | ICD-10-CM | POA: Diagnosis not present

## 2014-12-13 DIAGNOSIS — I2510 Atherosclerotic heart disease of native coronary artery without angina pectoris: Secondary | ICD-10-CM | POA: Diagnosis not present

## 2014-12-13 MED ORDER — AMLODIPINE BESYLATE 5 MG PO TABS
5.0000 mg | ORAL_TABLET | Freq: Every day | ORAL | Status: DC
Start: 2014-12-13 — End: 2015-10-12

## 2014-12-13 NOTE — Assessment & Plan Note (Signed)
Continue aspirin and statin. 

## 2014-12-13 NOTE — Assessment & Plan Note (Signed)
Blood pressure elevated. Add Norvasc 5 mg daily and follow. Increase as needed.

## 2014-12-13 NOTE — Assessment & Plan Note (Signed)
Continue statin. 

## 2014-12-13 NOTE — Patient Instructions (Signed)
Your physician wants you to follow-up in: ONE YEAR WITH DR CRENSHAW You will receive a reminder letter in the mail two months in advance. If you don't receive a letter, please call our office to schedule the follow-up appointment.   START AMLODIPINE 5 MG ONCE DAILY 

## 2014-12-26 ENCOUNTER — Ambulatory Visit (INDEPENDENT_AMBULATORY_CARE_PROVIDER_SITE_OTHER): Payer: Managed Care, Other (non HMO) | Admitting: Psychiatry

## 2014-12-26 DIAGNOSIS — F319 Bipolar disorder, unspecified: Secondary | ICD-10-CM | POA: Diagnosis not present

## 2014-12-26 NOTE — Progress Notes (Signed)
   THERAPIST PROGRESS NOTE Session Time: 2:05-3:05  Participation Level: Active   Behavioral Response: Well GroomedAlertAnxiousEuthymic  Type of Therapy: Individual Therapy   Treatment Goals addressed: Coping  Interventions: CBT, Motivational Interviewing, Strength-based, Supportive and Reframing   Summary: Pt. Presented with euthymic mood. Pt. Reported that he had lunch with his wife prior to coming to today's session. Pt. Reports that he is encouraged by finding new doctor for his back who is scheduled to perform pain management procedure tomorrow. Pt. Also reports that his good mood is attributed to spending time outside working in his yard. Pt. Reports that his mood tends to be better in spring and early fall.  Pt. Reports that ongoing stressor is his children, particularly his daughter who is chemical dependent and his son who hs been diagnosed with a self-harming disorder. Pt. Continues to be encouraged by time spent with his wife; however, continues to report concerns about her efforts to control him. Pt. Discussed distance from sister who lives 20 minutes away who is not welcomed by his wife, and Pt. Does not invite his sister to his home. Pt. Reports feeling that wife is planning to leave him due to lack of financial intimacy and comments that she has made able her ability to enter new relationship than he could. Pt. Reports that his wife would not agree to marital counseling. Significant goal of the session was clarifying goal of acceptance of wife and her behavior toward him, not becoming attached to hurtful comments, and enjoying the companionship that is available to him in the marriage.   Suicidal/Homicidal: Nowithout intent/plan   Therapist Response: Assessed patients current functioning and reviewed progress. Assisted patient with the expression of anger and loneliness, and identification of aspects of his marriage for which he is grateful.   Plan: Continue with CBT based  therapy. Return again in two weeks.   Diagnosis: Axis I: Bipolar, Depressed  Axis II: No diagnosis    Nancie Neas, [facility] 12/26/2014

## 2014-12-27 ENCOUNTER — Other Ambulatory Visit (HOSPITAL_COMMUNITY): Payer: Self-pay | Admitting: Psychiatry

## 2015-01-03 ENCOUNTER — Ambulatory Visit (INDEPENDENT_AMBULATORY_CARE_PROVIDER_SITE_OTHER): Payer: Managed Care, Other (non HMO) | Admitting: Psychiatry

## 2015-01-03 ENCOUNTER — Telehealth (HOSPITAL_COMMUNITY): Payer: Self-pay | Admitting: *Deleted

## 2015-01-03 ENCOUNTER — Encounter (HOSPITAL_COMMUNITY): Payer: Self-pay | Admitting: Psychiatry

## 2015-01-03 VITALS — BP 106/70 | HR 89 | Ht 72.0 in | Wt 180.2 lb

## 2015-01-03 DIAGNOSIS — F319 Bipolar disorder, unspecified: Secondary | ICD-10-CM

## 2015-01-03 MED ORDER — LORAZEPAM 0.5 MG PO TABS: 0.5000 mg | ORAL_TABLET | Freq: Two times a day (BID) | ORAL | Status: DC

## 2015-01-03 MED ORDER — ZOLPIDEM TARTRATE 10 MG PO TABS: 10.0000 mg | ORAL_TABLET | Freq: Every evening | ORAL | Status: DC | PRN

## 2015-01-03 MED ORDER — LAMOTRIGINE ER 300 MG PO TB24: 1.0000 | ORAL_TABLET | Freq: Every day | ORAL | Status: DC

## 2015-01-03 NOTE — Telephone Encounter (Signed)
Patient left printed RX's after office visit.  I called patient to advise him of this and patient stated he will come back to pick up RX's. Patient did make follow up appointment.

## 2015-01-04 NOTE — Progress Notes (Signed)
[name]  Alex [name] [id number] 59 y.o.  01/04/2015 9:12 AM  Chief Complaint:  Medication management and followup.      History of Present Illness:  Alex [name] came for his followup appointment.  He is compliant with his medication and denies any side effects.  He continues to have chronic family issues with her has been no recent major problem.  He is taking his medication as prescribed.  He is seeing [name] for his pain management.  He has no rash or itching.  He denies any major panic attack.  He denies any feeling of hopelessness or worthlessness.  His appetite is okay.  His vitals are stable.  He has no rash, itching or any other side effects of the medication.  Patient denies drinking or using any illegal substances.  Suicidal Ideation: No Plan Formed: No Patient has means to carry out plan: No  Homicidal Ideation: No Plan Formed: No Patient has means to carry out plan: No  Review of Systems: Psychiatric: Agitation: No Hallucination: No Depressed Mood: No Insomnia: No Hypersomnia: No Altered Concentration: No Feels Worthless: No Grandiose Ideas: No Belief In Special Powers: No New/Increased Substance Abuse: No Compulsions: No  Neurologic: Headache: Yes Seizure: No Paresthesias: No  Medical  history Patient has history of hypertension , immune deficiency syndrome , GERD , Tick disorder, coronary artery disease, hypertension, COPD, esophagus stricture, lumbago, osteoarthritis, malignant neoplasm of orbit, vertigo, osteoarthritis and chronic fatigue.   Outpatient Encounter Prescriptions as of 01/03/2015  Medication Sig  . amLODipine (NORVASC) 5 MG tablet Take 1 tablet (5 mg total) by mouth daily.  [name] atorvastatin (LIPITOR) 40 MG tablet Take 1 tablet (40 mg total) by mouth daily.  . Coenzyme Q10 (CO Q 10 PO) Take by mouth daily.  . fluconazole (DIFLUCAN) 150 MG tablet   . Immune Globulin, Human, (HIZENTRA) 4 GM/20ML SOLN  Inject into the skin once a week. wednesday  . LamoTRIgine 300 MG TB24 Take 1 tablet (300 mg total) by mouth daily.  [name] levothyroxine (SYNTHROID) 150 MCG tablet Take 1 tablet (150 mcg total) by mouth daily.  [name] LORazepam (ATIVAN) 0.5 MG tablet Take 1 tablet (0.5 mg total) by mouth 2 (two) times daily.  . OMEGA-3 KRILL OIL PO Take by mouth daily.  [name] oxyCODONE-acetaminophen (PERCOCET) 7.5-325 MG per tablet Take 1 tablet by mouth every 6 (six) hours as needed.  . RABEprazole (ACIPHEX) 20 MG tablet TAKE 1 TABLET BY MOUTH EVERY DAY--  takes in pm  . sucralfate (CARAFATE) 1 G tablet Take 1 g by mouth 4 (four) times daily.  [name] telmisartan (MICARDIS) 80 MG tablet TAKE 1 TABLET (80 MG TOTAL) BY MOUTH DAILY.--   takes in am  . Testosterone 10 MG/ACT (2%) GEL   . zolpidem (AMBIEN) 10 MG tablet Take 1 tablet (10 mg total) by mouth at bedtime as needed. for sleep  . [DISCONTINUED] LamoTRIgine 300 MG TB24 Take 1 tablet (300 mg total) by mouth daily.  . [DISCONTINUED] LORazepam (ATIVAN) 0.5 MG tablet Take 1 tablet (0.5 mg total) by mouth 2 (two) times daily.  . [DISCONTINUED] zolpidem (AMBIEN) 10 MG tablet TAKE 1 TABLET BY MOUTH AT BEDTIME AS NEEDED FOR SLEEP   No facility-administered encounter medications on file as of 01/03/2015.    Past Psychiatric History/Hospitalization(s): Patient has been seeing in this office since 2009.  He has history of depression mood swing and anger.  He's been admitted to behavioral [facility] due to suicidal thoughts  but he denies any suicidal attempt.  He denies any psychosis but admitted poor impulse control.  In the past he has taken Seroquel and Cymbalta.   Anxiety: Yes Bipolar Disorder: Yes Depression: Yes Mania: No Psychosis: No Schizophrenia: No Personality Disorder: No Hospitalization for psychiatric illness: Yes History of Electroconvulsive Shock Therapy: No Prior Suicide Attempts: No  Physical Exam: Constitutional:  BP 106/70 mmHg  Pulse 89  Ht 6' (1.829  m)  Wt 180 lb 3.2 oz (81.738 kg)  BMI 24.43 kg/m2  General Appearance: alert, oriented, no acute distress  Musculoskeletal: Strength & Muscle Tone: within normal limits Gait & Station: normal Patient leans: N/A  ROS No results found for this or any previous visit (from the past 2160 hour(s)).  Mental status examination Patient is casually dressed and fairly groomed.  He is anxious but maintained a good eye contact.  He described his mood is euthymic and his affect is mood appropriate.  He denies any active or passive suicidal thoughts or homicidal thoughts.  He denies any auditory or visual hallucination.  He is restless but there were no tremors or shakes present.   His thought process is logical linear and goal-directed.  There were no flight of ideas present at this time.  His fund of knowledge is adequate.  His attention and concentration is okay.  He's alert and oriented x3.  His insight judgment and impulse control is okay.   Established Problem, Stable/Improving (1), Review of Psycho-Social Stressors (1), Review of Last Therapy Session (1) and Review of Medication Regimen & Side Effects (2)  Assessment: Axis I:  bipolar disorder1  Axis II:  deferred  Axis III:  see medical history   Patient Active Problem List   Diagnosis Date Noted  . Cervical disc disorder with radiculopathy of cervical region 06/06/2014  . Right elbow pain 06/06/2014  . Elevated WBC count 06/06/2014  . Iron deficiency anemia 06/06/2014  . Pre-operative exam 04/07/2014  . Chronic right SI joint pain 02/21/2014  . Edema 02/21/2014  . Tinnitus 02/21/2014  . Well adult exam 05/31/2013  . Glaucoma 05/31/2013  . RML pneumonia 03/31/2013  . Fever, unspecified 03/26/2013  . Hypertension, uncontrolled 02/25/2013  . Preop exam for internal medicine 04/02/2012  . Ingrowing toenail with infection 04/02/2012  . Localized osteoarthritis of left knee 04/02/2012  . Vertigo 01/05/2012  . Ataxia 01/05/2012  .  Anxiety state 08/08/2010  . COPD 11/30/2009  . ESOPHAGEAL STRICTURE 11/30/2009  . CHANGE IN BOWELS 11/30/2009  . Major depression, chronic 08/07/2009  . Osteoarthritis 08/07/2009  . GENERALIZED OSTEOARTHROSIS UNSPECIFIED SITE 10/20/2008  . HYPERLIPIDEMIA-MIXED 10/19/2008  . FOOT PAIN 07/11/2008  . EPIDYDIMITIS/ORCHITIS 05/24/2008  . FATIGUE 05/14/2008  . RASH-NONVESICULAR 05/14/2008  . Cough 03/29/2008  . Coronary atherosclerosis 11/04/2007  . Lumbago 11/04/2007  . THYROIDECTOMY, HX OF 11/04/2007  . 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 NEOPLASM, MALIGNANT, THYROID GLAND 10/02/2007  . 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 IMPAIRED GLUCOSE TOLERANCE 10/02/2007    Plan:  Patient is fairly stable on his medication despite a lot of psychosocial issues.  He is seeing Eloise Levels for coping skills.  He does not want to change his medication.  I will continue Lamictal 300 mg daily , Ativan 0.5 mg twice a day as needed and Ambien 10 mg half to one tablet as needed for insomnia.  Discussed benzodiazepine dependence, tolerance and withdrawal symptoms.  Recommended to keep appointment with Eloise Levels for counseling.  Recommended  to call us back if he has any question or any concern.  Follow-up in 3 months.  Dhanvin Szeto T., MD 01/04/2015

## 2015-01-11 ENCOUNTER — Telehealth (HOSPITAL_COMMUNITY): Payer: Self-pay

## 2015-01-11 NOTE — Telephone Encounter (Signed)
Alex [name] picked up prescription on 6/0/04  [place] [facility] [id number]  dlo

## 2015-01-13 ENCOUNTER — Ambulatory Visit (INDEPENDENT_AMBULATORY_CARE_PROVIDER_SITE_OTHER): Payer: Managed Care, Other (non HMO) | Admitting: Psychiatry

## 2015-01-13 DIAGNOSIS — F319 Bipolar disorder, unspecified: Secondary | ICD-10-CM

## 2015-01-16 NOTE — Progress Notes (Signed)
   THERAPIST PROGRESS NOTE  Session Time: 3:05-4:15  Participation Level: Active   Behavioral Response: Well GroomedAlertAnxiousEuthymic  Type of Therapy: Individual Therapy   Treatment Goals addressed: Coping  Interventions: CBT, Motivational Interviewing, Strength-based, Supportive and Reframing   Summary: Pt. Continues to present with euthymic mood, mildly anxious. Pt. Reports significant stressors involving adult children and relationship with his wife. Pt. Reports ongoing concerns with daughter who is chemical dependent and son who has severe self-harming disorder. Session focused on developing healthy care boundaries with his daughter. For example, Pt.'s daughter has legal problems and discussed allowing her chance to make attorney contacts with follow up. [place] was recommended as support for issues related to co-dependency. Pt. Reported connecting with nature, working on his lawn as important to his self-care.  Suicidal/Homicidal: Nowithout intent/plan   Therapist Response: Assessed patients current functioning and reviewed progress. Assisted patient with the expression of anger and loneliness, and identification of aspects of his marriage for which he is grateful.   Plan: Continue with CBT based therapy. Return again in two weeks.   Diagnosis: Axis I: Bipolar, Depressed  Axis II: No diagnosis   Nancie Neas, [facility] 01/16/2015

## 2015-01-20 ENCOUNTER — Other Ambulatory Visit: Payer: Self-pay | Admitting: Oncology

## 2015-01-27 ENCOUNTER — Ambulatory Visit (HOSPITAL_COMMUNITY): Payer: Self-pay | Admitting: Psychiatry

## 2015-02-09 ENCOUNTER — Ambulatory Visit (HOSPITAL_COMMUNITY): Payer: Self-pay | Admitting: Psychiatry

## 2015-02-23 ENCOUNTER — Ambulatory Visit (INDEPENDENT_AMBULATORY_CARE_PROVIDER_SITE_OTHER): Payer: Managed Care, Other (non HMO) | Admitting: Psychiatry

## 2015-02-23 DIAGNOSIS — F319 Bipolar disorder, unspecified: Secondary | ICD-10-CM

## 2015-02-27 NOTE — Progress Notes (Signed)
   THERAPIST PROGRESS NOTE  Session Time: 2:05-3:05  Participation Level: Active   Behavioral Response: Well GroomedAlertAnxiousEuthymic  Type of Therapy: Individual Therapy   Treatment Goals addressed: Coping  Interventions: CBT, Motivational Interviewing, Strength-based, Supportive and Reframing   Summary: Pt. Continues to present with euthymic mood, mildly anxious. Pt. Reports that family recently returned from family vacation. Pt. Reported that his wife got viral respiratory infection and spread to family members during the trip. Pt. Reported that he spent most of the vacation was spent caregiving for family members. Pt. Acknowledged strength of being nurturer for the family. Pt. Continues to express anger/resentment about negativity that he believes wife shares with children and friends about him. Pt. Reports that he tries to focus on self-care by resting when he can and trying to manage shoulder pain. Pt. Is anticipating shoulder surgery that will to minimize shoulder pain. Pt. Processed challenges with trying to set healthy boundaries with his adult children with mental health and substance abuse problems.   Suicidal/Homicidal: Nowithout intent/plan   Therapist Response: Assessed patients current functioning and reviewed progress. Assisted patient processing issues with children, issues in marriage, lack of self-care.  Plan: Continue with CBT based therapy. Return again in two weeks.   Diagnosis: Axis I: Bipolar, Depressed  Axis II: No diagnosis   Nancie Neas, [facility] 02/27/2015

## 2015-03-02 ENCOUNTER — Other Ambulatory Visit (HOSPITAL_COMMUNITY): Payer: Self-pay | Admitting: Psychiatry

## 2015-03-02 DIAGNOSIS — F319 Bipolar disorder, unspecified: Secondary | ICD-10-CM

## 2015-03-03 NOTE — Telephone Encounter (Signed)
Met with Dr. Adele Schilder who authorized a one time refill of patient's prescribed Ambien 16m, one at bedtime.  [facility] [address] AEb[facility]ail pharmacist.  Verified patient was taking as prescribed as last filled on 01/11/15.  Patient to return for next evaluation on 04/05/15.

## 2015-03-07 ENCOUNTER — Other Ambulatory Visit: Payer: Self-pay | Admitting: *Deleted

## 2015-03-08 ENCOUNTER — Telehealth: Payer: Self-pay | Admitting: Oncology

## 2015-03-08 NOTE — Telephone Encounter (Signed)
Confirmed appointment change to earlier appointment on 10/17/15

## 2015-03-09 ENCOUNTER — Ambulatory Visit (HOSPITAL_COMMUNITY): Payer: Self-pay | Admitting: Psychiatry

## 2015-03-17 ENCOUNTER — Other Ambulatory Visit: Payer: Self-pay | Admitting: *Deleted

## 2015-03-17 ENCOUNTER — Other Ambulatory Visit (HOSPITAL_COMMUNITY): Payer: Self-pay | Admitting: Psychiatry

## 2015-03-28 ENCOUNTER — Telehealth (HOSPITAL_COMMUNITY): Payer: Self-pay

## 2015-03-28 NOTE — Telephone Encounter (Signed)
Medication management - Telephone call from patient who is out of town visiting his sister and stated he would be needing a refill of Lamictal soon. Informed patient his refill request was denied as he had a 90 day order e-scribed to his [facility] on 01/03/15 and is set to return to see Dr. Adele Schilder on 04/05/15.  Questioned if patient would be back in town by then and if he has enough Lamictal until appointment.  Patient had forgotten appointment but reported he would be back for the appointment and once he returns home this week on 03/30/15 he will check to make sure he has enough medication to last until appointment time on 04/05/15.  Patient agreed to call back if he will need refills prior to appointment once he returns home from visiting his sister out of state this week.

## 2015-03-31 ENCOUNTER — Telehealth: Payer: Self-pay | Admitting: *Deleted

## 2015-03-31 NOTE — Telephone Encounter (Signed)
Returned call to per per VM requesting lab results.  Obtained from [facility] showing heme of 14.3 and ferritan of 166.  Per lab result pt had testosterone checked as well with level of 189.  Above information left on identified VM- results also faxed to Dr Diona Fanti and Dr Alain Marion.

## 2015-04-04 ENCOUNTER — Other Ambulatory Visit: Payer: Self-pay | Admitting: Gastroenterology

## 2015-04-04 DIAGNOSIS — R1011 Right upper quadrant pain: Secondary | ICD-10-CM

## 2015-04-05 ENCOUNTER — Encounter (HOSPITAL_COMMUNITY): Payer: Self-pay | Admitting: Psychiatry

## 2015-04-05 ENCOUNTER — Ambulatory Visit (INDEPENDENT_AMBULATORY_CARE_PROVIDER_SITE_OTHER): Payer: Managed Care, Other (non HMO) | Admitting: Psychiatry

## 2015-04-05 VITALS — BP 124/88 | HR 79 | Ht 72.0 in | Wt 183.4 lb

## 2015-04-05 DIAGNOSIS — F319 Bipolar disorder, unspecified: Secondary | ICD-10-CM

## 2015-04-05 MED ORDER — ZOLPIDEM TARTRATE 10 MG PO TABS: 10.0000 mg | ORAL_TABLET | Freq: Every evening | ORAL | Status: DC | PRN

## 2015-04-05 MED ORDER — LORAZEPAM 0.5 MG PO TABS: 0.5000 mg | ORAL_TABLET | Freq: Two times a day (BID) | ORAL | Status: DC

## 2015-04-05 MED ORDER — LAMOTRIGINE ER 300 MG PO TB24: 1.0000 | ORAL_TABLET | Freq: Every day | ORAL | Status: DC
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
## 2015-04-07 ENCOUNTER — Ambulatory Visit
Admission: RE | Admit: 2015-04-07 | Discharge: 2015-04-07 | Disposition: A | Discharge status: Home / Self Care | Payer: Managed Care, Other (non HMO) | Source: Ambulatory Visit | Attending: Gastroenterology | Admitting: Gastroenterology

## 2015-04-07 DIAGNOSIS — R1011 Right upper quadrant pain: Secondary | ICD-10-CM

## 2015-04-12 ENCOUNTER — Other Ambulatory Visit: Payer: Self-pay | Admitting: Orthopaedic Surgery

## 2015-04-12 DIAGNOSIS — M48061 Spinal stenosis, lumbar region without neurogenic claudication: Secondary | ICD-10-CM

## 2015-04-14 ENCOUNTER — Ambulatory Visit
Admission: RE | Admit: 2015-04-14 | Discharge: 2015-04-14 | Disposition: A | Discharge status: Home / Self Care | Payer: Managed Care, Other (non HMO) | Source: Ambulatory Visit | Attending: Orthopaedic Surgery | Admitting: Orthopaedic Surgery

## 2015-04-14 DIAGNOSIS — M48061 Spinal stenosis, lumbar region without neurogenic claudication: Secondary | ICD-10-CM

## 2015-04-20 ENCOUNTER — Other Ambulatory Visit: Payer: Self-pay | Admitting: Internal Medicine

## 2015-05-12 ENCOUNTER — Other Ambulatory Visit: Payer: Self-pay | Admitting: Physician Assistant

## 2015-05-12 DIAGNOSIS — M7542 Impingement syndrome of left shoulder: Secondary | ICD-10-CM

## 2015-05-16 ENCOUNTER — Ambulatory Visit
Admission: RE | Admit: 2015-05-16 | Discharge: 2015-05-16 | Disposition: A | Discharge status: Home / Self Care | Payer: Managed Care, Other (non HMO) | Source: Ambulatory Visit | Attending: Physician Assistant | Admitting: Physician Assistant

## 2015-05-16 DIAGNOSIS — M7542 Impingement syndrome of left shoulder: Secondary | ICD-10-CM

## 2015-05-17 ENCOUNTER — Other Ambulatory Visit: Payer: Self-pay

## 2015-05-25 ENCOUNTER — Other Ambulatory Visit: Payer: Self-pay | Admitting: Gastroenterology

## 2015-05-27 ENCOUNTER — Other Ambulatory Visit: Payer: Self-pay

## 2015-05-29 ENCOUNTER — Ambulatory Visit (HOSPITAL_COMMUNITY): Payer: Self-pay | Admitting: Psychiatry

## 2015-05-29 ENCOUNTER — Ambulatory Visit (INDEPENDENT_AMBULATORY_CARE_PROVIDER_SITE_OTHER): Payer: 59 | Admitting: Psychiatry

## 2015-05-29 DIAGNOSIS — F319 Bipolar disorder, unspecified: Secondary | ICD-10-CM | POA: Diagnosis not present
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
## 2015-06-04 ENCOUNTER — Encounter (HOSPITAL_COMMUNITY): Payer: Self-pay | Admitting: General Practice

## 2015-06-04 ENCOUNTER — Emergency Department (HOSPITAL_COMMUNITY)
Admission: EM | Admit: 2015-06-04 | Discharge: 2015-06-04 | Disposition: A | Discharge status: Home / Self Care | Payer: Managed Care, Other (non HMO) | Attending: Emergency Medicine | Admitting: Emergency Medicine

## 2015-06-04 DIAGNOSIS — F419 Anxiety disorder, unspecified: Secondary | ICD-10-CM | POA: Insufficient documentation

## 2015-06-04 DIAGNOSIS — Z79899 Other long term (current) drug therapy: Secondary | ICD-10-CM | POA: Diagnosis not present

## 2015-06-04 DIAGNOSIS — K5900 Constipation, unspecified: Secondary | ICD-10-CM | POA: Diagnosis not present

## 2015-06-04 DIAGNOSIS — M1990 Unspecified osteoarthritis, unspecified site: Secondary | ICD-10-CM | POA: Diagnosis not present

## 2015-06-04 DIAGNOSIS — Z85850 Personal history of malignant neoplasm of thyroid: Secondary | ICD-10-CM | POA: Insufficient documentation

## 2015-06-04 DIAGNOSIS — E785 Hyperlipidemia, unspecified: Secondary | ICD-10-CM | POA: Diagnosis not present

## 2015-06-04 DIAGNOSIS — Z85840 Personal history of malignant neoplasm of eye: Secondary | ICD-10-CM | POA: Insufficient documentation

## 2015-06-04 DIAGNOSIS — Z87442 Personal history of urinary calculi: Secondary | ICD-10-CM | POA: Insufficient documentation

## 2015-06-04 DIAGNOSIS — Z87438 Personal history of other diseases of male genital organs: Secondary | ICD-10-CM | POA: Insufficient documentation

## 2015-06-04 DIAGNOSIS — F319 Bipolar disorder, unspecified: Secondary | ICD-10-CM | POA: Insufficient documentation

## 2015-06-04 DIAGNOSIS — I2510 Atherosclerotic heart disease of native coronary artery without angina pectoris: Secondary | ICD-10-CM | POA: Diagnosis not present

## 2015-06-04 DIAGNOSIS — K219 Gastro-esophageal reflux disease without esophagitis: Secondary | ICD-10-CM | POA: Diagnosis not present

## 2015-06-04 DIAGNOSIS — G8929 Other chronic pain: Secondary | ICD-10-CM | POA: Insufficient documentation

## 2015-06-04 DIAGNOSIS — Z8669 Personal history of other diseases of the nervous system and sense organs: Secondary | ICD-10-CM | POA: Insufficient documentation

## 2015-06-04 MED ORDER — GLYCERIN (LAXATIVE) 2 G RE SUPP: 1.0000 | Freq: Once | RECTAL | Status: DC

## 2015-06-04 NOTE — ED Notes (Signed)
Pt arrived via [facility]. Pt had spinal surgery on 05/30/15 at [facility], and was discharged home on 06/01/15. Pt was attempted to have a bowel movement this morning and had 3 syncopal episodes, pt reports bearing down to have a bowel movement. Pt denies any LOC, or falls. Pt is A/O. Pt takes miralax, and Senakot at home. Pts last bowel movement was on 05/30/15.

## 2015-06-04 NOTE — Discharge Instructions (Signed)
1. Medications: 6 caps of miralax in 32 oz gatorade x 1 day, 3 caps of miralax in water or gatorade two times per day x 5 days, glycerin suppository, usual home medications 2. Treatment: rest, drink plenty of fluids 3. Follow Up: please followup with your primary doctor this week for discussion of your diagnoses and further evaluation after today's visit; if you do not have a primary care doctor use the resource guide provided to find one; please return to the ER for persistent constipation, high fever, vomiting, new or worsening symptoms   Constipation, Adult Constipation is when a person has fewer than three bowel movements a week, has difficulty having a bowel movement, or has stools that are dry, hard, or larger than normal. As people grow older, constipation is more common. A low-fiber diet, not taking in enough fluids, and taking certain medicines may make constipation worse.  CAUSES   Certain medicines, such as antidepressants, pain medicine, iron supplements, antacids, and water pills.   Certain diseases, such as diabetes, irritable bowel syndrome (IBS), thyroid disease, or depression.   Not drinking enough water.   Not eating enough fiber-rich foods.   Stress or travel.   Lack of physical activity or exercise.   Ignoring the urge to have a bowel movement.   Using laxatives too much.  SIGNS AND SYMPTOMS   Having fewer than three bowel movements a week.   Straining to have a bowel movement.   Having stools that are hard, dry, or larger than normal.   Feeling full or bloated.   Pain in the lower abdomen.   Not feeling relief after having a bowel movement.  DIAGNOSIS  Your health care provider will take a medical history and perform a physical exam. Further testing may be done for severe constipation. Some tests may include:  A barium enema X-ray to examine your rectum, colon, and, sometimes, your small intestine.   A sigmoidoscopy to examine your lower  colon.   A colonoscopy to examine your entire colon. TREATMENT  Treatment will depend on the severity of your constipation and what is causing it. Some dietary treatments include drinking more fluids and eating more fiber-rich foods. Lifestyle treatments may include regular exercise. If these diet and lifestyle recommendations do not help, your health care provider may recommend taking over-the-counter laxative medicines to help you have bowel movements. Prescription medicines may be prescribed if over-the-counter medicines do not work.  HOME CARE INSTRUCTIONS   Eat foods that have a lot of fiber, such as fruits, vegetables, whole grains, and beans.  Limit foods high in fat and processed sugars, such as french fries, hamburgers, cookies, candies, and soda.   A fiber supplement may be added to your diet if you cannot get enough fiber from foods.   Drink enough fluids to keep your urine clear or pale yellow.   Exercise regularly or as directed by your health care provider.   Go to the restroom when you have the urge to go. Do not hold it.   Only take over-the-counter or prescription medicines as directed by your health care provider. Do not take other medicines for constipation without talking to your health care provider first.  [place] IF:   You have bright red blood in your stool.   Your constipation lasts for more than 4 days or gets worse.   You have abdominal or rectal pain.   You have thin, pencil-like stools.   You have unexplained weight loss. MAKE SURE  YOU:   Understand these instructions.  Will watch your condition.  Will get help right away if you are not doing well or get worse.   This information is not intended to replace advice given to you by your health care provider. Make sure you discuss any questions you have with your health care provider.   Document Released: 04/26/2004 Document Revised: 08/19/2014 Document Reviewed:  05/10/2013 Elsevier Interactive Patient Education [employer].

## 2015-06-04 NOTE — ED Provider Notes (Signed)
CSN: [id number]     Arrival date & time 06/04/15  1053 History   First MD Initiated Contact with Patient 06/04/15 1054     Chief Complaint  Patient presents with  . Constipation    HPI   Alex [name] is a 59 y.o. male with a PMH of anxiety, depression, HTN, HLD, lumbar fusion 10/18 who presents to the ED with constipation. He states he has not had a bowel movement since the morning of surgery. He reports associated abdominal cramping. He has been taking dilaudid for pain, and has tried miralax and senakot for his symptoms. He states he tried to have a bowel movement this morning, but become dizzy and lightheaded while bearing down. He denies LOC. He denies fever, chills, chest pain, shortness of breath, nausea, vomiting, dysuria, urgency, frequency. He states he is still passing gas.    Past Medical History  Diagnosis Date  . Anxiety   . Hypertension   . Hyperlipidemia   . Immune deficiency disorder ([place])     COMMON VARIABLE  . GERD (gastroesophageal reflux disease)   . Glaucoma BOTH EYES -- NO DROPS   . History of thyroid cancer PRIMARY (NO METS FROM ORBITAL CANCER)--   IN REMISSION    S/P TOTAL THYROIDECTOMY  , CHEMORADIATION  (ONCOLOGIST -- DR Griffith Citron)  . 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 Renal calculi LEFT KIDNEY-- NON-OBSTRUCTIVE  . Depression   . Bipolar I disorder ([place])   . Complication of anesthesia POST URINARY RETENTION---  2006 SHOULDER SURGERY MARKED BRADYCARDIA VAGAL RESPONSE NO ISSUE W/ SURGERY AFTER THIS ONE  . BPH (benign prostatic hypertrophy) with urinary obstruction   . Positional vertigo   . Coronary atherosclerosis CARDIOLOGIST- DR CRENSHAW--  LAST VISIT 01-05-2012 IN [facility]    NON-OBSTRUCTIVE MILD DISEASE  . Urinary hesitancy   . Epicondylitis     right elbow  . Ulnar nerve compression      right elbow  . Chronic back pain   . OA (osteoarthritis)   . Nocturia   . History of chronic prostatitis   . At risk for sleep apnea     STOP-BANG= 5    SENT TO PCP 04-22-2014  . Bleeding ulcer 06/2014   Past Surgical History  Procedure Laterality Date  . Occuloplastic surgery  2002  . Total thyroidectomy  11-03-2001    PAPILLARY THYROID CARCINOMA  . Right supraomohyoid neck dissection   03-08-2003    ZONES 1,2,3;   SUBMANDIBULAR MASS / METASTATIC SQUAMOUS CELL CARCINOMA RIGHT NECK  . Pars plana vitrectomy  11-06-2004    RIGHT EYE RADIATION RETINOPATHY W/ HEMORRHAGE  . Left hydrocelectomy  03-29-2005    AND REPAIR LEFT INGUINAL HERNIA W/ MESH  . Nasal endoscopy  08-07-2005    RIGHT EPISTAXIS  / POST SEPTOPLASTY  (HX RIGHT ORBITAL CA & S/P RADIATION/ NECROSIS ANTERIOR END OF BOTH INFERIOR TURBINATES)  . Cardiac catheterization  01-16-2006  DR THOMAS WALL    MILD CORONARY ATHEROSCLEROSIS/ MID TO DISTAL LAD 40% STENOSIS/ LVF 50-55%  . Shoulder arthroscopy w/ subacromial decompression and distal clavicle excision  10-09-2008    AND DEBRIDEMENT OF RIGHT SHOULDER IMPINGEMENT & [facility] JOINT ARTHRITIS  . Right ankle arthroscopy w/ extensive debridement  04-05-2008   . Left ankle  arthroscopy w/ debridement  05-12-2007  . Mohs surgery  2002    RIGHT ORBITAL CANCER  . Repair undesended right testicle / right inguinal hernia  AGE 55  . Septoplasty  NOV 2006  . Knee arthroscopy  05/01/2012    Procedure: ARTHROSCOPY KNEE;  Surgeon: Johnn Hai, MD;  Location: [facility];  Service: Orthopedics;  Laterality: Left;  debridement and removal of loose body  . Transthoracic echocardiogram  12/ 2012    grade I diastolic dysfunction/ ef 40-98%  . Ulnar nerve transposition Right 04/28/2014    Procedure: RIGHT ELBOW ULNA NERVE RELEASE TRANSPOSTION AND MEDIAL EPICONDYLAR DEBRIDEMENT AND REPAIR;  Surgeon: Linna Hoff, MD;  Location: [facility];  Service: Orthopedics;   Laterality: Right;  . Spine surgery     Family History  Problem Relation Age of Onset  . Depression Sister   . Depression Daughter   . Drug abuse Daughter   . Kidney disease Mother   . Depression Mother   . Stroke Father   . COPD Father   . Hypertension Father    Social History  Substance Use Topics  . Smoking status: Never Smoker   . Smokeless tobacco: Never Used  . Alcohol Use: No    Review of Systems  Constitutional: Negative for fever and chills.  Respiratory: Negative for shortness of breath.   Cardiovascular: Negative for chest pain.  Gastrointestinal: Positive for abdominal pain, constipation and abdominal distention. Negative for nausea, vomiting and diarrhea.  Genitourinary: Negative for dysuria, urgency and frequency.  Musculoskeletal: Negative for back pain.  Neurological: Positive for dizziness and light-headedness.  All other systems reviewed and are negative.     Allergies  Percocet  Home Medications   Prior to Admission medications   Medication Sig Start Date End Date Taking? Authorizing Provider  amLODipine (NORVASC) 5 MG tablet Take 1 tablet (5 mg total) by mouth daily. 12/13/14   Lelon Perla, MD  atorvastatin (LIPITOR) 40 MG tablet TAKE 1 TABLET (40 MG TOTAL) BY MOUTH DAILY. 04/20/15   Aleksei Plotnikov V, MD  Coenzyme Q10 (CO Q 10 PO) Take by mouth daily.    Historical Provider, MD  fluconazole (DIFLUCAN) 150 MG tablet  12/07/14   Historical Provider, MD  Immune Globulin, Human, (HIZENTRA) 4 GM/20ML SOLN Inject into the skin once a week. wednesday    Historical Provider, MD  LamoTRIgine 300 MG TB24 Take 1 tablet (300 mg total) by mouth daily. 04/05/15   Kathlee Nations, MD  levothyroxine (SYNTHROID) 150 MCG tablet Take 1 tablet (150 mcg total) by mouth daily. 08/09/14   Aleksei Plotnikov V, MD  LORazepam (ATIVAN) 0.5 MG tablet Take 1 tablet (0.5 mg total) by mouth 2 (two) times daily. 04/05/15   Kathlee Nations, MD  OMEGA-3 KRILL OIL PO Take by mouth  daily.    Historical Provider, MD  oxyCODONE-acetaminophen (PERCOCET) 7.5-325 MG per tablet Take 1 tablet by mouth every 6 (six) hours as needed. 05/31/14   Historical Provider, MD  RABEprazole (ACIPHEX) 20 MG tablet TAKE 1 TABLET BY MOUTH EVERY DAY--  takes in pm 03/02/13   Aleksei Plotnikov V, MD  sucralfate (CARAFATE) 1 G tablet Take 1 g by mouth 4 (four) times daily. 06/14/14   Historical Provider, MD  telmisartan (MICARDIS) 80 MG tablet TAKE 1 TABLET (80 MG TOTAL) BY MOUTH DAILY.--   takes in am 10/28/14   Lelon Perla, MD  Testosterone 10 MG/ACT (2%) GEL  11/17/14  Historical Provider, MD  zolpidem (AMBIEN) 10 MG tablet Take 1 tablet (10 mg total) by mouth at bedtime as needed. for sleep 04/05/15   Kathlee Nations, MD    BP 136/94 mmHg  Pulse 88  Temp(Src) 98.4 F (36.9 C) (Oral)  Resp 18  Ht 6' (1.829 m)  Wt 180 lb (81.647 kg)  BMI 24.41 kg/m2  SpO2 97% Physical Exam  Constitutional: He is oriented to person, place, and time. He appears well-developed and well-nourished. No distress.  HENT:  Head: Normocephalic and atraumatic.  Right Ear: External ear normal.  Left Ear: External ear normal.  Nose: Nose normal.  Mouth/Throat: Uvula is midline, oropharynx is clear and moist and mucous membranes are normal.  Eyes: Conjunctivae, EOM and lids are normal. Pupils are equal, round, and reactive to light. Right eye exhibits no discharge. Left eye exhibits no discharge. No scleral icterus.  Neck: Normal range of motion. Neck supple.  Cardiovascular: Normal rate, regular rhythm, normal heart sounds, intact distal pulses and normal pulses.   Pulmonary/Chest: Effort normal and breath sounds normal. No respiratory distress. He has no wheezes. He has no rales.  Abdominal: Soft. Normal appearance and bowel sounds are normal. He exhibits no distension and no mass. There is tenderness. There is no rigidity, no rebound and no guarding.  Mild TTP. No rebound, guarding, or palpable masses.   Genitourinary: Rectal exam shows no external hemorrhoid, no internal hemorrhoid, no fissure, no mass and no tenderness.  No impaction.  Musculoskeletal: Normal range of motion. He exhibits no edema or tenderness.  Neurological: He is alert and oriented to person, place, and time. He has normal strength. No cranial nerve deficit or sensory deficit.  Skin: Skin is warm, dry and intact. No rash noted. He is not diaphoretic. No erythema. No pallor.  Psychiatric: He has a normal mood and affect. His speech is normal and behavior is normal.  Nursing note and vitals reviewed.   ED Course  Procedures (including critical care time)  Labs Review Labs Reviewed - No data to display  Imaging Review No results found.   I have personally reviewed and evaluated these images and lab results as part of my medical decision-making.   EKG Interpretation None      MDM   Final diagnoses:  Constipation, unspecified constipation type    59 year old male presents to the ED with constipation. States he has not had a bowel movement since Tuesday (prior to his lumbar fusion). Reports associated abdominal cramping. Has been taking dilaudid for pain, and has tried miralax (1-3 caps daily) and senakot for his symptoms. States he became dizzy and lightheaded while bearing down attempting to have a bowel movement this morning. Denies LOC. Denies fever, chills, chest pain, shortness of breath, nausea, vomiting, dysuria, urgency, frequency. He states he is still passing gas.   Patient is afebrile. Vital signs stable. Heart regular rate and rhythm. Lungs clear auscultation bilaterally. Abdomen with mild diffuse tenderness to palpation. No rebound, guarding, or masses. No impaction on DRE.  Patient discussed with Dr. Laneta Simmers. Advised patient to take 6 caps of miralax with 32 oz of gatorade today and 3 caps of miralax BID for the following 5 days. Also prescribed glycerin suppositories. Patient to follow-up with PCP.  Return precautions discussed at length. Patient verbalizes his understanding and is in agreement with plan.  BP 138/85 mmHg  Pulse 86  Temp(Src) 98.4 F (36.9 C) (Oral)  Resp 18  Ht 6' (1.829 m)  Wt 180 lb (  81.647 kg)  BMI 24.41 kg/m2  SpO2 98%   Marella Chimes, PA-C 06/04/15 1652  Leo Grosser, MD 06/05/15 [phone]

## 2015-06-12 ENCOUNTER — Ambulatory Visit (HOSPITAL_COMMUNITY): Payer: Self-pay | Admitting: Psychiatry

## 2015-06-12 ENCOUNTER — Other Ambulatory Visit (HOSPITAL_COMMUNITY): Payer: Self-pay | Admitting: Psychiatry

## 2015-06-12 DIAGNOSIS — F3162 Bipolar disorder, current episode mixed, moderate: Secondary | ICD-10-CM

## 2015-06-16 NOTE — Telephone Encounter (Signed)
Telephone call with Dr. Adele Schilder to question if he wanted to fill patient's prescribed Ambien refill request.  Dr. Adele Schilder authorized a one time 30 day refill order and called in order to patient's [facility] on [address] [facility] with Clair Gulling, pharmacist.

## 2015-06-26 ENCOUNTER — Ambulatory Visit (HOSPITAL_COMMUNITY): Payer: Self-pay | Admitting: Psychiatry

## 2015-06-27 ENCOUNTER — Telehealth: Payer: Self-pay

## 2015-06-27 NOTE — Telephone Encounter (Addendum)
Pt had his cbc tibc and tst drawn in October at [facility], he is waiting for our response to the labs. No record found in his chart of these labs. He gets these drawn monthly and he knows it is due again soon. He is also asking [place] to forward the results to Dr Dorina Hoyer

## 2015-06-27 NOTE — Telephone Encounter (Signed)
Please ask [facility] to forward these and all labs to [place]-- thanks!

## 2015-06-29 NOTE — Telephone Encounter (Signed)
Contacted [facility] in [place] at [phone] and requested most recent CBC and Ferritin- last draw noted as 05/22/2015.  Results to be faxed to this RN.

## 2015-07-10 ENCOUNTER — Ambulatory Visit (HOSPITAL_COMMUNITY): Payer: Self-pay | Admitting: Psychiatry

## 2015-07-11 ENCOUNTER — Ambulatory Visit (INDEPENDENT_AMBULATORY_CARE_PROVIDER_SITE_OTHER): Payer: 59 | Admitting: Psychiatry

## 2015-07-11 ENCOUNTER — Encounter: Payer: Self-pay | Admitting: Internal Medicine

## 2015-07-11 ENCOUNTER — Encounter (HOSPITAL_COMMUNITY): Payer: Self-pay | Admitting: Psychiatry

## 2015-07-11 VITALS — BP 130/90 | HR 76 | Resp 12 | Ht 73.0 in | Wt 180.2 lb

## 2015-07-11 DIAGNOSIS — F319 Bipolar disorder, unspecified: Secondary | ICD-10-CM

## 2015-07-11 MED ORDER — LORAZEPAM 0.5 MG PO TABS: ORAL_TABLET | ORAL | Status: DC

## 2015-07-11 MED ORDER — LAMOTRIGINE ER 300 MG PO TB24: 1.0000 | ORAL_TABLET | Freq: Every day | ORAL | Status: DC

## 2015-07-11 NOTE — Progress Notes (Signed)
[facility] Progress Note  Alex [name] [id number] 59 y.o.  07/11/2015 3:01 PM  Chief Complaint:  I have back surgery 6 weeks ago.  I'm taking pain medication.      History of Present Illness:  Alex [name] came for his followup appointment.  Patient has back surgery 6 weeks ago at [facility].  He is feeling better since he had surgery but he is also taking hydromorphone 4 mg every 3 hours.  He sleeping better.  He still have a lot of family issues and his son has been in and out from psychiatric hospital.  Patient believe his son started again to have self inflicting injuries and requires blood transfusion due to bleeding.  He endorse a lot of financial strain .  He has cut down his Ambien .  Though he denies any mania, irritability or any anger but he admitted sometimes feeling overwhelmed and having racing thoughts.  He has not seen Eloise Levels in a while because of health issues but now he is scheduled to see her next week.  He has no tremors, shakes, EPS.  He admitted despite multiple family stressors his medicine is working very well.  He has no rage or any manic symptoms.  He likes Lamictal .  He denies drinking or using any illegal substances.  His appetite is okay.  His vitals are stable.  His blood work on October 19 shows WBC count 16, hematocrit 39, platelet 178.  His chemistry shows sodium 134, potassium 5.1, BUN 16 and creatinine 0.88.  His glucose was 148.  Suicidal Ideation: No Plan Formed: No Patient has means to carry out plan: No  Homicidal Ideation: No Plan Formed: No Patient has means to carry out plan: No  Review of Systems: Psychiatric: Agitation: No Hallucination: No Depressed Mood: No Insomnia: No Hypersomnia: No Altered Concentration: No Feels Worthless: No Grandiose Ideas: No Belief In Special Powers: No New/Increased Substance Abuse: No Compulsions: No  Neurologic: Headache: No Seizure: No Paresthesias:  No  Medical  history Patient has history of hypertension , immune deficiency syndrome , GERD , Tick disorder, coronary artery disease, hypertension, COPD, esophagus stricture, lumbago, osteoarthritis, malignant neoplasm of orbit, vertigo, osteoarthritis and chronic fatigue.   Outpatient Encounter Prescriptions as of 07/11/2015  Medication Sig  . amLODipine (NORVASC) 5 MG tablet Take 1 tablet (5 mg total) by mouth daily.  [name] atorvastatin (LIPITOR) 40 MG tablet TAKE 1 TABLET (40 MG TOTAL) BY MOUTH DAILY.  [name] Coenzyme Q10 (CO Q 10 PO) Take by mouth daily.  . fluconazole (DIFLUCAN) 150 MG tablet   . glycerin adult (GLYCERIN ADULT) 2 G SUPP Place 1 suppository rectally once.  [name] HYDROmorphone (DILAUDID) 4 MG tablet TAKE 1 TABLET BY MOUTH EVERY 3 HOURS AS NEEDED FOR SEVERE PAIN  . Immune Globulin, Human, (HIZENTRA) 4 GM/20ML SOLN Inject into the skin once a week. wednesday  . LamoTRIgine 300 MG TB24 Take 1 tablet (300 mg total) by mouth daily.  [name] levothyroxine (SYNTHROID) 150 MCG tablet Take 1 tablet (150 mcg total) by mouth daily.  [name] LORazepam (ATIVAN) 0.5 MG tablet Take 1 tab daily as needed and 1 tab at bed time  . OMEGA-3 KRILL OIL PO Take by mouth daily.  [name] oxyCODONE-acetaminophen (PERCOCET) 7.5-325 MG per tablet Take 1 tablet by mouth every 6 (six) hours as needed.  . RABEprazole (ACIPHEX) 20 MG tablet TAKE 1 TABLET BY MOUTH EVERY DAY--  takes in pm  . sucralfate (CARAFATE) 1 G  tablet Take 1 g by mouth 4 (four) times daily.  [name] telmisartan (MICARDIS) 80 MG tablet TAKE 1 TABLET (80 MG TOTAL) BY MOUTH DAILY.--   takes in am  . Testosterone 10 MG/ACT (2%) GEL   . zolpidem (AMBIEN) 10 MG tablet TAKE 1 TABLET BY MOUTH AT BEDTIME AS NEEDED FOR SLEEP  . [DISCONTINUED] LamoTRIgine 300 MG TB24 Take 1 tablet (300 mg total) by mouth daily.  . [DISCONTINUED] LORazepam (ATIVAN) 0.5 MG tablet Take 1 tablet (0.5 mg total) by mouth 2 (two) times daily.   No facility-administered encounter medications on file  as of 07/11/2015.    Past Psychiatric History/Hospitalization(s): Patient has been seeing in this office since 2009.  He has history of depression mood swing and anger.  He's been admitted to [facility] due to suicidal thoughts but he denies any suicidal attempt.  He denies any psychosis but admitted poor impulse control.  In the past he has taken Seroquel and Cymbalta.   Anxiety: Yes Bipolar Disorder: Yes Depression: Yes Mania: No Psychosis: No Schizophrenia: No Personality Disorder: No Hospitalization for psychiatric illness: Yes History of Electroconvulsive Shock Therapy: No Prior Suicide Attempts: No  Physical Exam: Constitutional:  BP 130/90 mmHg  Pulse 76  Resp 12  Ht 6\' 1"  (1.854 m)  Wt 180 lb 3.2 oz (81.738 kg)  BMI 23.78 kg/m2  General Appearance: alert, oriented, no acute distress  Musculoskeletal: Strength & Muscle Tone: within normal limits Gait & Station: normal Patient leans: N/A  Review of Systems  Constitutional: Negative for fever.  Musculoskeletal: Positive for back pain.  Skin: Negative for itching and rash.  Neurological: Negative for dizziness and headaches.  Psychiatric/Behavioral: Negative for depression, suicidal ideas, hallucinations and substance abuse. The patient is nervous/anxious. The patient does not have insomnia.    No results found for this or any previous visit (from the past 2160 hour(s)).  Mental status examination Patient is casually dressed and fairly groomed.  He is anxious but maintained a good eye contact.  He described his mood euthymic and his affect is mood appropriate.  He denies any active or passive suicidal thoughts or homicidal thoughts.  He denies any auditory or visual hallucination.  He is restless but there were no tremors or shakes present.   His thought process is logical linear and goal-directed.  There were no flight of ideas present at this time.  His fund of knowledge is adequate.  His attention and  concentration is okay.  He's alert and oriented x3.  His insight judgment and impulse control is okay.   Established Problem, Stable/Improving (1), New problem, with additional work up planned, Review of Psycho-Social Stressors (1), Review or order clinical lab tests (1), Decision to obtain old records (1), Review and summation of old records (2), Review of Last Therapy Session (1), Review of Medication Regimen & Side Effects (2) and Review of New Medication or Change in Dosage (2)  Assessment: Axis I:  bipolar disorder1  Axis II:  deferred  Axis III:  see medical history   Patient Active Problem List   Diagnosis Date Noted  . Cervical disc disorder with radiculopathy of cervical region 06/06/2014  . Right elbow pain 06/06/2014  . Elevated WBC count 06/06/2014  . Iron deficiency anemia 06/06/2014  . Pre-operative exam 04/07/2014  . Chronic right SI joint pain 02/21/2014  . Edema 02/21/2014  . Tinnitus 02/21/2014  . Well adult exam 05/31/2013  . Glaucoma 05/31/2013  . RML pneumonia 03/31/2013  . Fever,  unspecified 03/26/2013  . Hypertension, uncontrolled 02/25/2013  . Preop exam for internal medicine 04/02/2012  . Ingrowing toenail with infection 04/02/2012  . Localized osteoarthritis of left knee 04/02/2012  . Vertigo 01/05/2012  . Ataxia 01/05/2012  . Anxiety state 08/08/2010  . COPD 11/30/2009  . ESOPHAGEAL STRICTURE 11/30/2009  . CHANGE IN BOWELS 11/30/2009  . Major depression, chronic ([place]) 08/07/2009  . Osteoarthritis 08/07/2009  . GENERALIZED OSTEOARTHROSIS UNSPECIFIED SITE 10/20/2008  . HYPERLIPIDEMIA-MIXED 10/19/2008  . FOOT PAIN 07/11/2008  . EPIDYDIMITIS/ORCHITIS 05/24/2008  . FATIGUE 05/14/2008  . RASH-NONVESICULAR 05/14/2008  . Cough 03/29/2008  . Coronary atherosclerosis 11/04/2007  . Lumbago 11/04/2007  . THYROIDECTOMY, HX OF 11/04/2007  . Malignant neoplasm of orbit ([place]) 10/02/2007  . NEOPLASM, MALIGNANT, THYROID GLAND 10/02/2007  . Hypogonadism male  10/02/2007  . 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[place]) 10/02/2007  . IMPAIRED GLUCOSE TOLERANCE 10/02/2007    Plan:  I reviewed records from another hospital including blood work results.  He is taking hydromorphone 4 mg every 3 hours.  I recommended to stop Ambien for now and take lorazepam only at bedtime and if needed during the daytime.  We will provide only 45 tablets of Ativan.  Encouraged to keep appointment with Eloise Levels for counseling and to discuss his family issues  Continue Lamictal 300 mg daily.  Once he finished narcotic pain medication we will resume his Ambien.  Discussed medication side effects especially interaction with benzodiazepine and narcotic pain medication.  Patient agreed with the plan.  Discuss long-term prognosis and tolerance and withdrawal .  Recommended to call us back if he has any question or any concern.  Discuss safety plan that anytime having active suicidal thoughts or homicidal thoughts and he need to call 911 or go to the local emergency room.  Alex Tatro T., MD 07/11/2015

## 2015-07-16 ENCOUNTER — Other Ambulatory Visit: Payer: Self-pay | Admitting: Oncology

## 2015-07-16 ENCOUNTER — Encounter: Payer: Self-pay | Admitting: Oncology

## 2015-07-16 ENCOUNTER — Other Ambulatory Visit: Payer: Self-pay | Admitting: Internal Medicine

## 2015-07-26 ENCOUNTER — Telehealth: Payer: Self-pay | Admitting: Internal Medicine

## 2015-07-26 ENCOUNTER — Ambulatory Visit (INDEPENDENT_AMBULATORY_CARE_PROVIDER_SITE_OTHER): Payer: 59 | Admitting: Psychiatry

## 2015-07-26 DIAGNOSIS — Z0000 Encounter for general adult medical examination without abnormal findings: Secondary | ICD-10-CM

## 2015-07-26 DIAGNOSIS — F319 Bipolar disorder, unspecified: Secondary | ICD-10-CM | POA: Diagnosis not present

## 2015-07-26 NOTE — Telephone Encounter (Signed)
Pt is coming in for a physical next week and wants to labs ahead of time. Along with the regular he would like TSH and testosterone. Please advise

## 2015-07-27 NOTE — Progress Notes (Signed)
   THERAPIST PROGRESS NOTE  Session Time: 2:05-2:55  Participation Level: Active   Behavioral Response: Well GroomedAlertAnxiousEuthymic  Type of Therapy: Individual Therapy   Treatment Goals addressed: Coping  Interventions: CBT, Motivational Interviewing, Strength-based, Supportive and Reframing   Summary: Pt. Presents for first session after back surgery. Pt. Presents with brightened affect, walks with a cane. Pt. Reported that the surgery was successful. In previous session, Pt. Has discussed fears that his children and wife would not be supportive after surgery and give him the physical and emotional care that he would need post-surgery. Pt. Reported that he was surprised that his family were supportive. Pt. Reported that he did some physical therapy and is walking better, able to drive and learning to move without pain. Pt. Is walking straighter, but with a cane. Pt. Discussed his current level of stress and enhanced ability to tolerate frustrations and manage stress now that his pain level is significantly decreased. Pt. Reports that two of his children with mental health diagnoses continue to be emotional and financial stressors. Pt. Reports that he is feeling more of an emotional partnership with his wife, but continues to have issues related to trust with the finances. Pt. Reported fears that he is depleting his pension and his wife is actively saving her money and worries what will he do if she leaves the marriage. Session focused on processing conflict in the marriage, problems with trust, and creating thoughts of emotional safety for himself.  Suicidal/Homicidal: Nowithout intent/plan   Therapist Response: Assessed patients current functioning and reviewed progress. Assisted patient processing issues with children, issues in marriage, processing for management of stressors.  Plan: Continue with CBT based therapy. Return again in two weeks.   Diagnosis: Axis I: Bipolar,  Depressed  Axis II: No diagnosis   Nancie Neas, [facility] 07/27/2015

## 2015-07-28 ENCOUNTER — Other Ambulatory Visit: Payer: Self-pay | Admitting: Internal Medicine

## 2015-07-28 NOTE — Telephone Encounter (Signed)
Ok testost, TSH, PSA, UA, lipids, CMET, CBC Thx

## 2015-08-01 ENCOUNTER — Other Ambulatory Visit (INDEPENDENT_AMBULATORY_CARE_PROVIDER_SITE_OTHER): Payer: Managed Care, Other (non HMO)

## 2015-08-01 DIAGNOSIS — Z0000 Encounter for general adult medical examination without abnormal findings: Secondary | ICD-10-CM | POA: Diagnosis not present

## 2015-08-01 LAB — TESTOSTERONE: Testosterone: 305.73 ng/dL (ref 300.00–890.00)

## 2015-08-01 LAB — CBC WITH DIFFERENTIAL/PLATELET
Basophils Absolute: 0.1 10*3/uL (ref 0.0–0.1)
Basophils Relative: 0.4 % (ref 0.0–3.0)
Eosinophils Absolute: 0.2 10*3/uL (ref 0.0–0.7)
Eosinophils Relative: 1.6 % (ref 0.0–5.0)
HCT: 51 % (ref 39.0–52.0)
Hemoglobin: 16.4 g/dL (ref 13.0–17.0)
Lymphocytes Relative: 15.1 % (ref 12.0–46.0)
Lymphs Abs: 1.8 10*3/uL (ref 0.7–4.0)
MCHC: 32.2 g/dL (ref 30.0–36.0)
MCV: 81.8 fl (ref 78.0–100.0)
Monocytes Absolute: 1 10*3/uL (ref 0.1–1.0)
Monocytes Relative: 8.4 % (ref 3.0–12.0)
Neutro Abs: 8.9 10*3/uL — ABNORMAL HIGH (ref 1.4–7.7)
Neutrophils Relative %: 74.5 % (ref 43.0–77.0)
Platelets: 344 10*3/uL (ref 150.0–400.0)
RBC: 6.23 Mil/uL — ABNORMAL HIGH (ref 4.22–5.81)
RDW: 16.2 % — ABNORMAL HIGH (ref 11.5–15.5)
WBC: 11.9 10*3/uL — ABNORMAL HIGH (ref 4.0–10.5)

## 2015-08-01 LAB — COMPREHENSIVE METABOLIC PANEL
ALT: 33 U/L (ref 0–53)
AST: 23 U/L (ref 0–37)
Albumin: 4.6 g/dL (ref 3.5–5.2)
Alkaline Phosphatase: 105 U/L (ref 39–117)
BUN: 21 mg/dL (ref 6–23)
CO2: 29 mEq/L (ref 19–32)
Calcium: 9.5 mg/dL (ref 8.4–10.5)
Chloride: 102 mEq/L (ref 96–112)
Creatinine, Ser: 0.97 mg/dL (ref 0.40–1.50)
GFR: 83.45 mL/min (ref >60.00)
Glucose, Bld: 105 mg/dL — ABNORMAL HIGH (ref 70–99)
Potassium: 4.5 mEq/L (ref 3.5–5.1)
Sodium: 141 mEq/L (ref 135–145)
Total Bilirubin: 0.7 mg/dL (ref 0.2–1.2)
Total Protein: 7.4 g/dL (ref 6.0–8.3)

## 2015-08-01 LAB — LIPID PANEL
Cholesterol: 178 mg/dL (ref 0–200)
HDL: 70.8 mg/dL (ref >39.00)
LDL Cholesterol: 86 mg/dL (ref 0–99)
NonHDL: 107.48
Total CHOL/HDL Ratio: 3
Triglycerides: 105 mg/dL (ref 0.0–149.0)
VLDL: 21 mg/dL (ref 0.0–40.0)

## 2015-08-01 LAB — URINALYSIS, ROUTINE W REFLEX MICROSCOPIC
Bilirubin Urine: NEGATIVE
Hgb urine dipstick: NEGATIVE
Ketones, ur: NEGATIVE
Leukocytes, UA: NEGATIVE
Nitrite: NEGATIVE
RBC / HPF: NONE SEEN (ref 0–?)
Specific Gravity, Urine: 1.025 (ref 1.000–1.030)
Total Protein, Urine: NEGATIVE
Urine Glucose: NEGATIVE
Urobilinogen, UA: 0.2 (ref 0.0–1.0)
pH: 5.5 (ref 5.0–8.0)

## 2015-08-01 LAB — PSA: PSA: 1.46 ng/mL (ref 0.10–4.00)

## 2015-08-01 LAB — TSH: TSH: 4.14 u[IU]/mL (ref 0.35–4.50)

## 2015-08-01 NOTE — Telephone Encounter (Signed)
Labs entered.

## 2015-08-02 ENCOUNTER — Ambulatory Visit (INDEPENDENT_AMBULATORY_CARE_PROVIDER_SITE_OTHER): Payer: 59 | Admitting: Psychiatry

## 2015-08-02 DIAGNOSIS — F319 Bipolar disorder, unspecified: Secondary | ICD-10-CM | POA: Diagnosis not present

## 2015-08-03 NOTE — Progress Notes (Signed)
   THERAPIST PROGRESS NOTE  Session Time: 1:40-2:30  Participation Level: Active   Behavioral Response: Well GroomedAlertAnxiousEuthymic  Type of Therapy: Individual Therapy   Treatment Goals addressed: Coping  Interventions: CBT, Motivational Interviewing, Strength-based, Supportive and Reframing   Summary: Pt. Continues to present with mild anxiety and euthymic mood. Pt. Reports that his children with chemical dependency and mood disorder continue to be his most significant stressors. Pt. Reports that his mood has been improved since his surgery and significant reduction in pain. Pt. Reports that he and his wife are in a good place, which he attributes to disengaging from potential arguments. Pt. Reports that he recently received clean bill of health and continues to get positive reports from his immunologist. Pt. Discussed weekly immunology treatments that he gives to himself that he attributes to continued good health following his cancer diagnosis. Pt. Discussed financial stress and stress caused by his wife's friends who he does not feel respects him and does not honor relationship boundaries within the family. Pt. Reports that he plans to have a peaceful Christmas holiday and is thankful that his daughter will not spend Christmas with the family. Pt. Continues to work on emotional expression and making better boundaries with his wife and adult children and recognizing patterns of codependency in his children i.e. Recognizing that he cannot save his adult children.  Suicidal/Homicidal: Nowithout intent/plan   Therapist Response: Assessed patients current functioning and reviewed progress. Assisted patient processing issues with children, issues in marriage, processing for management of stressors.  Plan: Continue with CBT based therapy. Return again in two weeks.   Diagnosis: Axis I: Bipolar, Depressed  Axis II: No diagnosis    Nancie Neas, [facility] 08/03/2015

## 2015-08-04 ENCOUNTER — Encounter: Payer: Self-pay | Admitting: Internal Medicine

## 2015-08-04 ENCOUNTER — Ambulatory Visit (INDEPENDENT_AMBULATORY_CARE_PROVIDER_SITE_OTHER): Payer: Managed Care, Other (non HMO) | Admitting: Internal Medicine

## 2015-08-04 VITALS — BP 148/90 | HR 84 | Ht 73.0 in | Wt 179.0 lb

## 2015-08-04 DIAGNOSIS — H6123 Impacted cerumen, bilateral: Secondary | ICD-10-CM

## 2015-08-04 DIAGNOSIS — D839 Common variable immunodeficiency, unspecified: Secondary | ICD-10-CM

## 2015-08-04 DIAGNOSIS — Z0000 Encounter for general adult medical examination without abnormal findings: Secondary | ICD-10-CM | POA: Diagnosis not present

## 2015-08-04 DIAGNOSIS — N453 Epididymo-orchitis: Secondary | ICD-10-CM | POA: Diagnosis not present

## 2015-08-04 DIAGNOSIS — I10 Essential (primary) hypertension: Secondary | ICD-10-CM | POA: Diagnosis not present

## 2015-08-04 DIAGNOSIS — H6120 Impacted cerumen, unspecified ear: Secondary | ICD-10-CM | POA: Insufficient documentation

## 2015-08-04 DIAGNOSIS — F411 Generalized anxiety disorder: Secondary | ICD-10-CM

## 2015-08-04 MED ORDER — SULFAMETHOXAZOLE-TRIMETHOPRIM 800-160 MG PO TABS: 1.0000 | ORAL_TABLET | Freq: Two times a day (BID) | ORAL | Status: DC

## 2015-08-04 MED ORDER — LEVOTHYROXINE SODIUM 150 MCG PO TABS: 150.0000 ug | ORAL_TABLET | Freq: Every day | ORAL | Status: DC

## 2015-08-04 NOTE — Assessment & Plan Note (Signed)
We discussed age appropriate health related issues, including available/recomended screening tests and vaccinations. We discussed a need for adhering to healthy diet and exercise. Labs/EKG were reviewed/ordered. All questions were answered.   

## 2015-08-04 NOTE — Progress Notes (Signed)
Subjective:  Patient ID: Alex [name], male    DOB: 1956/08/05  Age: 59 y.o. MRN: [id number]  CC: Annual Exam   HPI Alex [name] [facility] presents for a well exam. Alex [name] had a spinal surgery  Outpatient Prescriptions Prior to Visit  Medication Sig Dispense Refill  . amLODipine (NORVASC) 5 MG tablet Take 1 tablet (5 mg total) by mouth daily. 180 tablet 3  . atorvastatin (LIPITOR) 40 MG tablet TAKE 1 TABLET (40 MG TOTAL) BY MOUTH DAILY. 90 tablet 0  . Coenzyme Q10 (CO Q 10 PO) Take by mouth daily.    [name] HYDROmorphone (DILAUDID) 4 MG tablet TAKE 1 TABLET BY MOUTH EVERY 3 HOURS AS NEEDED FOR SEVERE PAIN  0  . Immune Globulin, Human, (HIZENTRA) 4 GM/20ML SOLN Inject into the skin once a week. wednesday    . LamoTRIgine 300 MG TB24 Take 1 tablet (300 mg total) by mouth daily. 90 tablet 0  . LORazepam (ATIVAN) 0.5 MG tablet Take 1 tab daily as needed and 1 tab at bed time 45 tablet 1  . OMEGA-3 KRILL OIL PO Take by mouth daily.    [name] oxyCODONE-acetaminophen (PERCOCET) 7.5-325 MG per tablet Take 1 tablet by mouth every 6 (six) hours as needed.  0  . RABEprazole (ACIPHEX) 20 MG tablet TAKE 1 TABLET BY MOUTH EVERY DAY--  takes in pm    . sucralfate (CARAFATE) 1 G tablet Take 1 g by mouth 4 (four) times daily.  3  . telmisartan (MICARDIS) 80 MG tablet TAKE 1 TABLET (80 MG TOTAL) BY MOUTH DAILY.--   takes in am 90 tablet 3  . Testosterone 10 MG/ACT (2%) GEL     . zolpidem (AMBIEN) 10 MG tablet TAKE 1 TABLET BY MOUTH AT BEDTIME AS NEEDED FOR SLEEP 30 tablet 0  . levothyroxine (SYNTHROID) 150 MCG tablet Take 1 tablet (150 mcg total) by mouth daily. 90 tablet 3  . fluconazole (DIFLUCAN) 150 MG tablet Reported on 08/04/2015    . glycerin adult (GLYCERIN ADULT) 2 G SUPP Place 1 suppository rectally once. (Patient not taking: Reported on 08/04/2015) 10 each 0   No facility-administered medications prior to visit.    ROS Review of Systems  Constitutional: Positive for fatigue. Negative for appetite  change and unexpected weight change.  HENT: Negative for congestion, nosebleeds, sneezing, sore throat and trouble swallowing.   Eyes: Negative for itching and visual disturbance.  Respiratory: Negative for cough.   Cardiovascular: Negative for chest pain, palpitations and leg swelling.  Gastrointestinal: Negative for nausea, diarrhea, blood in stool and abdominal distention.  Genitourinary: Negative for frequency and hematuria.  Musculoskeletal: Positive for back pain. Negative for joint swelling, gait problem and neck pain.  Skin: Negative for rash.  Neurological: Negative for dizziness, tremors, speech difficulty and weakness.  Psychiatric/Behavioral: Negative for suicidal ideas, behavioral problems, sleep disturbance, self-injury, dysphoric mood, decreased concentration and agitation. The patient is nervous/anxious.     Objective:  BP 148/90 mmHg  Pulse 84  Ht 6\' 1"  (1.854 m)  Wt 179 lb (81.194 kg)  BMI 23.62 kg/m2  SpO2 96%  BP Readings from Last 3 Encounters:  08/04/15 148/90  07/11/15 130/90  06/04/15 138/85    Wt Readings from Last 3 Encounters:  08/04/15 179 lb (81.194 kg)  07/11/15 180 lb 3.2 oz (81.738 kg)  06/04/15 180 lb (81.647 kg)    Physical Exam  Constitutional: He is oriented to person, place, and time. He appears well-developed. No distress.  NAD  HENT:  Mouth/Throat: Oropharynx is clear and moist.  Eyes: Conjunctivae are normal. Pupils are equal, round, and reactive to light.  Neck: Normal range of motion. No JVD present. No thyromegaly present.  Cardiovascular: Normal rate, regular rhythm, normal heart sounds and intact distal pulses.  Exam reveals no gallop and no friction rub.   No murmur heard. Pulmonary/Chest: Effort normal and breath sounds normal. No respiratory distress. He has no wheezes. He has no rales. He exhibits no tenderness.  Abdominal: Soft. Bowel sounds are normal. He exhibits no distension and no mass. There is no tenderness. There is  no rebound and no guarding.  Musculoskeletal: Normal range of motion. He exhibits tenderness. He exhibits no edema.  Lymphadenopathy:    He has no cervical adenopathy.  Neurological: He is alert and oriented to person, place, and time. He has normal reflexes. No cranial nerve deficit. He exhibits normal muscle tone. He displays a negative Romberg sign. Coordination and gait normal.  Skin: Skin is warm and dry. No rash noted.  Psychiatric: He has a normal mood and affect. His behavior is normal. Judgment and thought content normal.  LS is tender - scar is well healed Wax B   Procedure Note :     Procedure :  Ear irrigation   Indication:  Cerumen impaction B   Risks, including pain, dizziness, eardrum perforation, bleeding, infection and others as well as benefits were explained to the patient in detail. Verbal consent was obtained and the patient agreed to proceed.    We used "The Elephant Ear Irrigation Device" filled with lukewarm water for irrigation. A large amount wax was recovered. Procedure has also required manual wax removal with an ear loop.   Tolerated well. Complications: None.   Postprocedure instructions :  Call if problems.    Lab Results  Component Value Date   WBC 11.9* 08/01/2015   HGB 16.4 08/01/2015   HCT 51.0 08/01/2015   PLT 344.0 08/01/2015   GLUCOSE 105* 08/01/2015   CHOL 178 08/01/2015   TRIG 105.0 08/01/2015   HDL 70.80 08/01/2015   LDLDIRECT 134.7 03/05/2007   LDLCALC 86 08/01/2015   ALT 33 08/01/2015   AST 23 08/01/2015   NA 141 08/01/2015   K 4.5 08/01/2015   CL 102 08/01/2015   CREATININE 0.97 08/01/2015   BUN 21 08/01/2015   CO2 29 08/01/2015   TSH 4.14 08/01/2015   PSA 1.46 08/01/2015   HGBA1C 6.1 08/04/2012    No results found.  Assessment & Plan:   There are no diagnoses linked to this encounter. I have changed [name] levothyroxine. I am also having him maintain his Immune Globulin (Human), Coenzyme Q10 (CO Q 10 PO),  OMEGA-3 KRILL OIL PO, RABEprazole, oxyCODONE-acetaminophen, sucralfate, telmisartan, fluconazole, Testosterone, amLODipine, glycerin adult, zolpidem, HYDROmorphone, LORazepam, LamoTRIgine, atorvastatin, AXIRON, ALPHAGAN P, methocarbamol, lidocaine-prilocaine, and aspirin EC.  Meds ordered this encounter  Medications  . AXIRON 30 MG/ACT SOLN    Sig: Place 2 Act onto the skin daily.  . ALPHAGAN P 0.1 % SOLN    Sig: Place 1 drop into both ears 2 (two) times daily.  . methocarbamol (ROBAXIN) 750 MG tablet    Sig: as needed.  . lidocaine-prilocaine (EMLA) cream    Sig: as needed.  [name] aspirin EC 81 MG tablet    Sig: Take 1 tablet by mouth daily.  [name] levothyroxine (SYNTHROID) 150 MCG tablet    Sig: Take 1 tablet (150 mcg total) by mouth daily. Brand name only please.  Dispense:  90 tablet    Refill:  3     Follow-up: No Follow-up on file.  Walker Kehr, MD

## 2015-08-04 NOTE — Assessment & Plan Note (Signed)
Will irrigate 

## 2015-08-04 NOTE — Assessment & Plan Note (Signed)
Amlodipine, Micardis 

## 2015-08-04 NOTE — Assessment & Plan Note (Signed)
Relapsing  Septra DS

## 2015-08-04 NOTE — Progress Notes (Signed)
Pre visit review using our clinic review tool, if applicable. No additional management support is needed unless otherwise documented below in the visit note. 

## 2015-08-04 NOTE — Patient Instructions (Signed)
Immunizations           Influenza Split 05/19/2012         Influenza Whole 08/07/2009, 05/24/2008          Influenza,inj,Quad PF,36+ Mos 06/06/2014, 05/31/2013          Influenza-Unspecified 05/31/2015          Pneumococcal Conjugate-13 11/23/2013          Pneumococcal Polysaccharide-23 05/19/2012          Td 04/09/2013         Mark as ReviewedLast reviewed by Cassandria Anger, MD on 08/04/2015 at 2:21 PM

## 2015-08-04 NOTE — Assessment & Plan Note (Signed)
Lorazepam prn  Potential benefits of a long term benzodiazepines  use as well as potential risks  and complications were explained to the patient and were aknowledged.  

## 2015-08-04 NOTE — Assessment & Plan Note (Signed)
on IV IgG

## 2015-08-08 ENCOUNTER — Ambulatory Visit (HOSPITAL_COMMUNITY): Payer: Self-pay | Admitting: Psychiatry

## 2015-08-17 ENCOUNTER — Ambulatory Visit (INDEPENDENT_AMBULATORY_CARE_PROVIDER_SITE_OTHER): Payer: 59 | Admitting: Psychiatry

## 2015-08-17 DIAGNOSIS — F319 Bipolar disorder, unspecified: Secondary | ICD-10-CM

## 2015-08-18 NOTE — Progress Notes (Signed)
   THERAPIST PROGRESS NOTE  Session Time: 1:35-2:05  Participation Level: Active   Behavioral Response: Well GroomedAlertAnxious  Type of Therapy: Individual Therapy   Treatment Goals addressed: Coping  Interventions: CBT, Motivational Interviewing, Strength-based, Supportive and Reframing   Summary: Pt. Presented 30 minutes late for session, requested 30 minute session. Pt. Provided that his scheduling application on his phone did not alarm which caused him to be late for his appointment. Pt. Reports that he is tired, stressed and feels overwhelmed. Pt. Provided awareness that despite successful back surgery that his self-care has been ignored for many months due to family crises. Pt. Reports that his daughter is causing stress to entire family because she is threatening to move back home because her lease is ending. Pt. Is concerned about ongoing drug use and irresponsible behavior. Significant portion of session focused on ongoing patterns of co-dependency in the relationship with his daughter and not feeling comfortable setting healthy boundaries, allowing her to hit rock bottom if need be, and allowing her to experience the consequences of her actions because she is an adult. Pt. Also discussed ongoing of son who engages in severe self-harming and is currently hospitalized. Counselor reviewed self-care behaviors including reconnecting with faith and church fellowship, and using gym membership to assist with mood and physical mobility. Pt. Also discussed reconnecting with nature which might include going to a park and sitting which Pt. Indicated he would be able to do even in the cold weather. Pt. Acknowledged of taking advantage of resources that are available to him in order to develop greater resilience during current stressors.   Suicidal/Homicidal: Nowithout intent/plan   Therapist Response: Assessed patients current functioning and reviewed progress. Assisted patient processing  issues with children, issues in marriage, processing for management of stressors.  Plan: Continue with CBT based therapy. Return again in two weeks.   Diagnosis: Axis I: Bipolar, Depressed  Axis II: No diagnosis   Nancie Neas, [facility] 08/18/2015

## 2015-08-23 ENCOUNTER — Ambulatory Visit (HOSPITAL_COMMUNITY): Payer: Self-pay | Admitting: Psychiatry

## 2015-08-31 ENCOUNTER — Ambulatory Visit (HOSPITAL_COMMUNITY): Payer: Self-pay | Admitting: Psychiatry

## 2015-09-03 ENCOUNTER — Other Ambulatory Visit (HOSPITAL_COMMUNITY): Payer: Self-pay | Admitting: Psychiatry

## 2015-09-05 ENCOUNTER — Other Ambulatory Visit: Payer: Self-pay | Admitting: Dermatology

## 2015-09-07 ENCOUNTER — Telehealth (HOSPITAL_COMMUNITY): Payer: Self-pay

## 2015-09-07 ENCOUNTER — Ambulatory Visit (INDEPENDENT_AMBULATORY_CARE_PROVIDER_SITE_OTHER): Payer: 59 | Admitting: Psychiatry

## 2015-09-07 ENCOUNTER — Other Ambulatory Visit (HOSPITAL_COMMUNITY): Payer: Self-pay | Admitting: Psychiatry

## 2015-09-07 DIAGNOSIS — F319 Bipolar disorder, unspecified: Secondary | ICD-10-CM

## 2015-09-07 DIAGNOSIS — F3162 Bipolar disorder, current episode mixed, moderate: Secondary | ICD-10-CM

## 2015-09-07 NOTE — Telephone Encounter (Signed)
Medication refill request - Fax received from patient's [facility] with message to please refill patient's Ambien medication, last written on 06/16/15 with no refills. Pt. returns 09/11/15 but requests refill prior to then.

## 2015-09-11 ENCOUNTER — Ambulatory Visit (HOSPITAL_COMMUNITY): Payer: Self-pay | Admitting: Psychiatry

## 2015-09-11 NOTE — Progress Notes (Signed)
   THERAPIST PROGRESS NOTE  Session Time: 2:05-2:55  Participation Level: Active   Behavioral Response: Well GroomedAlertAnxious  Type of Therapy: Individual Therapy   Treatment Goals addressed: Coping  Interventions: CBT, Motivational Interviewing, Strength-based, Supportive and Reframing   Summary: Pt. Continues to present with depression and mild anxiety. Pt. Reports that his son was released from the hospital after his last session and readmitted for cutting behavior. His son is seeking long-term treatment center, and Pt. Is concerned that it will not be helpful as the last time he was admitted. Pt. Also reports ongoing concern about his daughter needing to return to the home when her lease and roommate arrangement end. Pt. Reports that intervenor in his family/marriage indicates awareness of problem of enabling, but Pt. Not sure how to stop the cycle of enabling with his adult daughter. Pt. Recognizes her drug seeking and irresponsible behavior, but is worried about her being homeless despite her pattern of creating chaos in the family. Pt reports that his marriage is stable and he and his wife have not been arguing. Pt. Reports ongoing financial worries of depleting his savings and fears that his wife is saving her money. Pt. Reviewed plan from last session of recommitting to self-care; Pt. Reports that he has not moved forward with his plan but understands that he needs to to address need for self-care and stress management.  Suicidal/Homicidal: Nowithout intent/plan   Therapist Response: Assessed patients current functioning and reviewed progress. Assisted patient processing issues with children, issues in marriage, processing for management of stressors.  Plan: Continue with CBT based therapy. Return again in two weeks.   Diagnosis: Axis I: Bipolar, Depressed  Axis II: No diagnosis  Nancie Neas, [facility] 09/11/2015

## 2015-09-12 MED ORDER — ZOLPIDEM TARTRATE 10 MG PO TABS: 10.0000 mg | ORAL_TABLET | Freq: Every evening | ORAL | Status: DC | PRN

## 2015-09-12 NOTE — Telephone Encounter (Signed)
I s/w Dr Adele Schilder, patient is coming in next week okay to fill 30 days. I called into the pharmacy and called the patient to let him know

## 2015-09-13 ENCOUNTER — Other Ambulatory Visit (HOSPITAL_COMMUNITY): Payer: Self-pay | Admitting: Psychiatry

## 2015-09-13 ENCOUNTER — Ambulatory Visit (HOSPITAL_COMMUNITY): Payer: Self-pay | Admitting: Psychiatry

## 2015-09-21 ENCOUNTER — Encounter (HOSPITAL_COMMUNITY): Payer: Self-pay | Admitting: Psychiatry

## 2015-09-21 ENCOUNTER — Ambulatory Visit (INDEPENDENT_AMBULATORY_CARE_PROVIDER_SITE_OTHER): Payer: 59 | Admitting: Psychiatry

## 2015-09-21 VITALS — BP 136/86 | HR 78 | Ht 72.0 in | Wt 181.6 lb

## 2015-09-21 DIAGNOSIS — F319 Bipolar disorder, unspecified: Secondary | ICD-10-CM

## 2015-09-21 DIAGNOSIS — F3162 Bipolar disorder, current episode mixed, moderate: Secondary | ICD-10-CM

## 2015-09-21 MED ORDER — ZOLPIDEM TARTRATE 10 MG PO TABS: 10.0000 mg | ORAL_TABLET | Freq: Every evening | ORAL | Status: DC | PRN

## 2015-09-21 MED ORDER — LAMOTRIGINE ER 300 MG PO TB24: 1.0000 | ORAL_TABLET | Freq: Every day | ORAL | Status: DC

## 2015-09-21 MED ORDER — LORAZEPAM 0.5 MG PO TABS: ORAL_TABLET | ORAL | Status: DC

## 2015-09-21 NOTE — Progress Notes (Signed)
[facility] [id number] Progress Note  Alex [name] [id number] 60 y.o.  09/21/2015 3:49 PM  Chief Complaint:  I was doing good until recently get stressed out because now might daughter moved in.  My son is still in the hospital and he may go to [facility].  I stop taking pain medication.    History of Present Illness:  Alex [name] came for his followup appointment.  He admitted increased stress and anxiety at home because his daughter now moved in last week.  Patient told she does not get along with her coworker and quit her job.  Now she is looking for another job and until then she is staying with them.  Her son is in the hospital after self inflicting injuries and may require state hospitalization and waiting for bed.  Patient admitted lately more anxious nervous.  He stopped taking pain medication or taking only as needed.  He missed one night Ambien but now he has refills.  When he takes his medication he sleeps good.  He denies any paranoia or any hallucination but endorsed a lot of family issues.  He started counseling with Eloise Levels is helping him.  He admitted a lot of financial strain and get some time overwhelmed.  His back pain is much better but he has noticed some time numbness in his finger .  He is scheduled to see his physician very soon.  He is taking Lamictal and denies any rash itching or any EPS.  His appetite is okay.  His vitals are stable.  Recently he seen his primary care physician Dr.Platinkove and labs were drawn.  His TSH PSA is normal.  His basic chemistry shows mild elevation of glucose.  His WBC count is 11.9.  His testosterone level is normal.  His BUN and creatinine is normal.  Suicidal Ideation: No Plan Formed: No Patient has means to carry out plan: No  Homicidal Ideation: No Plan Formed: No Patient has means to carry out plan: No  Review of Systems: Psychiatric: Agitation: No Hallucination: No Depressed Mood: No Insomnia: No Hypersomnia:  No Altered Concentration: No Feels Worthless: No Grandiose Ideas: No Belief In Special Powers: No New/Increased Substance Abuse: No Compulsions: No  Neurologic: Headache: No Seizure: No Paresthesias: No  Medical  history Patient has history of hypertension , immune deficiency syndrome , GERD , Tick disorder, coronary artery disease, hypertension, COPD, esophagus stricture, lumbago, osteoarthritis, malignant neoplasm of orbit, vertigo, osteoarthritis and chronic fatigue.   Outpatient Encounter Prescriptions as of 09/21/2015  Medication Sig  . ALPHAGAN P 0.1 % SOLN Place 1 drop into both ears 2 (two) times daily.  [name] amLODipine (NORVASC) 5 MG tablet Take 1 tablet (5 mg total) by mouth daily.  [name] aspirin EC 81 MG tablet Take 1 tablet by mouth daily.  [name] atorvastatin (LIPITOR) 40 MG tablet TAKE 1 TABLET (40 MG TOTAL) BY MOUTH DAILY.  [name] AXIRON 30 MG/ACT SOLN Place 2 Act onto the skin daily.  . Coenzyme Q10 (CO Q 10 PO) Take by mouth daily.  . fluconazole (DIFLUCAN) 150 MG tablet Reported on 08/04/2015  . glycerin adult (GLYCERIN ADULT) 2 G SUPP Place 1 suppository rectally once. (Patient not taking: Reported on 08/04/2015)  . Immune Globulin, Human, (HIZENTRA) 4 GM/20ML SOLN Inject into the skin once a week. wednesday  . LamoTRIgine 300 MG TB24 Take 1 tablet (300 mg total) by mouth daily.  [name] levothyroxine (SYNTHROID) 150 MCG tablet Take 1 tablet (150 mcg total) by mouth daily.  Brand name only please.  . lidocaine-prilocaine (EMLA) cream as needed.  [name] LORazepam (ATIVAN) 0.5 MG tablet Take 1 tab daily as needed and 1 tab at bed time  . OMEGA-3 KRILL OIL PO Take by mouth daily.  . RABEprazole (ACIPHEX) 20 MG tablet TAKE 1 TABLET BY MOUTH EVERY DAY--  takes in pm  . sucralfate (CARAFATE) 1 G tablet Take 1 g by mouth 4 (four) times daily.  [name] telmisartan (MICARDIS) 80 MG tablet TAKE 1 TABLET (80 MG TOTAL) BY MOUTH DAILY.--   takes in am  . Testosterone 10 MG/ACT (2%) GEL   . zolpidem (AMBIEN) 10  MG tablet Take 1 tablet (10 mg total) by mouth at bedtime as needed. for sleep  . [DISCONTINUED] HYDROmorphone (DILAUDID) 4 MG tablet TAKE 1 TABLET BY MOUTH EVERY 3 HOURS AS NEEDED FOR SEVERE PAIN  . [DISCONTINUED] LamoTRIgine 300 MG TB24 Take 1 tablet (300 mg total) by mouth daily.  . [DISCONTINUED] LORazepam (ATIVAN) 0.5 MG tablet Take 1 tab daily as needed and 1 tab at bed time  . [DISCONTINUED] methocarbamol (ROBAXIN) 750 MG tablet as needed.  . [DISCONTINUED] oxyCODONE-acetaminophen (PERCOCET) 7.5-325 MG per tablet Take 1 tablet by mouth every 6 (six) hours as needed.  . [DISCONTINUED] sulfamethoxazole-trimethoprim (BACTRIM DS,SEPTRA DS) 800-160 MG tablet Take 1 tablet by mouth 2 (two) times daily.  . [DISCONTINUED] zolpidem (AMBIEN) 10 MG tablet Take 1 tablet (10 mg total) by mouth at bedtime as needed. for sleep   No facility-administered encounter medications on file as of 09/21/2015.    Past Psychiatric History/Hospitalization(s): Patient has been seeing in this office since 2009.  He has history of depression mood swing and anger.  He's been admitted to behavioral [place] due to suicidal thoughts but he denies any suicidal attempt.  He denies any psychosis but admitted poor impulse control.  In the past he has taken Seroquel and Cymbalta.   Anxiety: Yes Bipolar Disorder: Yes Depression: Yes Mania: No Psychosis: No Schizophrenia: No Personality Disorder: No Hospitalization for psychiatric illness: Yes History of Electroconvulsive Shock Therapy: No Prior Suicide Attempts: No  Physical Exam: Constitutional:  BP 136/86 mmHg  Pulse 78  Ht 6' (1.829 m)  Wt 181 lb 9.6 oz (82.373 kg)  BMI 24.62 kg/m2  General Appearance: alert, oriented, no acute distress  Musculoskeletal: Strength & Muscle Tone: within normal limits Gait & Station: normal Patient leans: N/A  Review of Systems  Constitutional: Negative for fever.  Musculoskeletal: Positive for back pain.  Skin:  Negative for itching and rash.  Neurological: Negative for dizziness and headaches.  Psychiatric/Behavioral: Negative for depression, suicidal ideas, hallucinations and substance abuse. The patient is nervous/anxious. The patient does not have insomnia.    Recent Results (from the past 2160 hour(s))  TSH     Status: None   Collection Time: 08/01/15  9:52 AM  Result Value Ref Range   TSH 4.14 0.35 - 4.50 uIU/mL  CBC with Differential/Platelet     Status: Abnormal   Collection Time: 08/01/15  9:52 AM  Result Value Ref Range   WBC 11.9 (H) 4.0 - 10.5 K/uL   RBC 6.23 (H) 4.22 - 5.81 Mil/uL   Hemoglobin 16.4 13.0 - 17.0 g/dL   HCT 51.0 39.0 - 52.0 %   MCV 81.8 78.0 - 100.0 fl   MCHC 32.2 30.0 - 36.0 g/dL   RDW 16.2 (H) 11.5 - 15.5 %   Platelets 344.0 150.0 - 400.0 K/uL   Neutrophils Relative % 74.5 43.0 -  77.0 %   Lymphocytes Relative 15.1 12.0 - 46.0 %   Monocytes Relative 8.4 3.0 - 12.0 %   Eosinophils Relative 1.6 0.0 - 5.0 %   Basophils Relative 0.4 0.0 - 3.0 %   Neutro Abs 8.9 (H) 1.4 - 7.7 K/uL   Lymphs Abs 1.8 0.7 - 4.0 K/uL   Monocytes Absolute 1.0 0.1 - 1.0 K/uL   Eosinophils Absolute 0.2 0.0 - 0.7 K/uL   Basophils Absolute 0.1 0.0 - 0.1 K/uL  PSA     Status: None   Collection Time: 08/01/15  9:52 AM  Result Value Ref Range   PSA 1.46 0.10 - 4.00 ng/mL  Testosterone     Status: None   Collection Time: 08/01/15  9:52 AM  Result Value Ref Range   Testosterone 305.73 300.00 - 890.00 ng/dL  Comprehensive metabolic panel     Status: Abnormal   Collection Time: 08/01/15  9:52 AM  Result Value Ref Range   Sodium 141 135 - 145 mEq/L   Potassium 4.5 3.5 - 5.1 mEq/L   Chloride 102 96 - 112 mEq/L   CO2 29 19 - 32 mEq/L   Glucose, Bld 105 (H) 70 - 99 mg/dL   BUN 21 6 - 23 mg/dL   Creatinine, Ser 0.97 0.40 - 1.50 mg/dL   Total Bilirubin 0.7 0.2 - 1.2 mg/dL   Alkaline Phosphatase 105 39 - 117 U/L   AST 23 0 - 37 U/L   ALT 33 0 - 53 U/L   Total Protein 7.4 6.0 - 8.3 g/dL    Albumin 4.6 3.5 - 5.2 g/dL   Calcium 9.5 8.4 - 10.5 mg/dL   GFR 83.45 >60.00 mL/min  Urinalysis, Routine w reflex microscopic (not at [facility])     Status: None   Collection Time: 08/01/15  9:52 AM  Result Value Ref Range   Color, Urine YELLOW Yellow;Lt. Yellow   APPearance CLEAR Clear   Specific Gravity, Urine 1.025 1.000-1.030   pH 5.5 5.0 - 8.0   Total Protein, Urine NEGATIVE Negative   Urine Glucose NEGATIVE Negative   Ketones, ur NEGATIVE Negative   Bilirubin Urine NEGATIVE Negative   Hgb urine dipstick NEGATIVE Negative   Urobilinogen, UA 0.2 0.0 - 1.0   Leukocytes, UA NEGATIVE Negative   Nitrite NEGATIVE Negative   WBC, UA 0-2/hpf 0-2/hpf   RBC / HPF none seen 0-2/hpf   Squamous Epithelial / LPF Rare(0-4/hpf) Rare(0-4/hpf)  Lipid panel     Status: None   Collection Time: 08/01/15  9:52 AM  Result Value Ref Range   Cholesterol 178 0 - 200 mg/dL    Comment: ATP III Classification       Desirable:  < 200 mg/dL               Borderline High:  200 - 239 mg/dL          High:  > = 240 mg/dL   Triglycerides 105.0 0.0 - 149.0 mg/dL    Comment: Normal:  <150 mg/dLBorderline High:  150 - 199 mg/dL   HDL 70.80 >39.00 mg/dL   VLDL 21.0 0.0 - 40.0 mg/dL   LDL Cholesterol 86 0 - 99 mg/dL   Total CHOL/HDL Ratio 3     Comment:                Men          Women1/2 Average Risk     3.4          3.3Average  Risk          5.0          4.42X Average Risk          9.6          7.13X Average Risk          15.0          11.0                       NonHDL 107.48     Comment: NOTE:  Non-HDL goal should be 30 mg/dL higher than patient's LDL goal (i.e. LDL goal of < 70 mg/dL, would have non-HDL goal of < 100 mg/dL)    Mental status examination Patient is casually dressed and fairly groomed.  He is anxious but maintained a good eye contact.  He described his mood  anxious and his affect is mood appropriate.  He denies any active or passive suicidal thoughts or homicidal thoughts.  He denies any auditory or  visual hallucination.  He is restless but there were no tremors or shakes present.   His thought process is logical linear and goal-directed.  There were no flight of ideas present at this time.  His fund of knowledge is adequate.  His attention and concentration is okay.  He's alert and oriented x3.  His insight judgment and impulse control is okay.   Established Problem, Stable/Improving (1), Review of Psycho-Social Stressors (1), Review or order clinical lab tests (1), Review and summation of old records (2), Review of Last Therapy Session (1) and Review of Medication Regimen & Side Effects (2)  Assessment: Axis I:  bipolar disorder1  Axis II:  deferred  Axis III:  see medical history   Patient Active Problem List   Diagnosis Date Noted  . Cerumen impaction 08/04/2015  . Cervical disc disorder with radiculopathy of cervical region 06/06/2014  . Right elbow pain 06/06/2014  . Elevated WBC count 06/06/2014  . Iron deficiency anemia 06/06/2014  . Pre-operative exam 04/07/2014  . Chronic right SI joint pain 02/21/2014  . Edema 02/21/2014  . Tinnitus 02/21/2014  . Well adult exam 05/31/2013  . Glaucoma 05/31/2013  . RML pneumonia 03/31/2013  . Fever, unspecified 03/26/2013  . Hypertension, uncontrolled 02/25/2013  . Preop exam for internal medicine 04/02/2012  . Ingrowing toenail with infection 04/02/2012  . Localized osteoarthritis of left knee 04/02/2012  . Vertigo 01/05/2012  . Ataxia 01/05/2012  . Anxiety state 08/08/2010  . COPD 11/30/2009  . ESOPHAGEAL STRICTURE 11/30/2009  . CHANGE IN BOWELS 11/30/2009  . Major depression, chronic ([place]) 08/07/2009  . Osteoarthritis 08/07/2009  . GENERALIZED OSTEOARTHROSIS UNSPECIFIED SITE 10/20/2008  . HYPERLIPIDEMIA-MIXED 10/19/2008  . FOOT PAIN 07/11/2008  . Orchitis and epididymitis 05/24/2008  . FATIGUE 05/14/2008  . RASH-NONVESICULAR 05/14/2008  . Cough 03/29/2008  . Coronary atherosclerosis 11/04/2007  . Lumbago 11/04/2007  .  THYROIDECTOMY, HX OF 11/04/2007  . 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 NEOPLASM, MALIGNANT, THYROID GLAND 10/02/2007  . 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 IMPAIRED GLUCOSE TOLERANCE 10/02/2007    Plan:  I reviewed records from  his primary care physician and review of psychosocial stressors.  He is no longer taking narcotic pain medication.  He is recovering from his surgery.  He like to restart Ativan for his anxiety.  It was discontinued as patient was taking narcotic pain medication.  I would also restart Ambien which had helped  him in the past for insomnia.  Continue Ativan 0.5 mg at bedtime and 0.5 mg during the day as needed, Ambien 10 mg half to one tablet as needed for insomnia and Lamictal 300 mg daily.  Discussed medication side effects and benefits.  At this time he has no rash itching or any concerns from Lamictal.  Encouraged to keep appointment with Eloise Levels for counseling.  Discussed benzodiazepine dependency, withdrawal and tolerance in detail. Recommended to call us back if he has any question or any concern.  Discuss safety plan that anytime having active suicidal thoughts or homicidal thoughts and he need to call 911 or go to the local emergency room. follow-up in 3 months   Muskaan Smet T., MD 09/21/2015

## 2015-09-27 ENCOUNTER — Ambulatory Visit (INDEPENDENT_AMBULATORY_CARE_PROVIDER_SITE_OTHER): Payer: Managed Care, Other (non HMO) | Admitting: Psychiatry

## 2015-09-27 DIAGNOSIS — F319 Bipolar disorder, unspecified: Secondary | ICD-10-CM

## 2015-10-01 NOTE — Progress Notes (Signed)
   THERAPIST PROGRESS NOTE  Session Time: 2:30-3:25  Participation Level: Active   Behavioral Response: Well GroomedAlertAnxious  Type of Therapy: Individual Therapy   Treatment Goals addressed: Coping  Interventions: CBT, Motivational Interviewing, Strength-based, Supportive and Reframing   Summary: Pt. Continues to present with depression and mild anxiety. Pt. Reports that his son is back in the hospital with IVC and is waiting for bed in state hospital. Pt. Reports that his older daughter is still living with the family and causing family members stress because she is a drain on household resources and does not take care of her dog. Session focused on patterns of co-dependency and setting realistic, age appropriate boundaries with consequences for his adult daughter. Session also focused on Pt.'s fears of setting appropriate boundaries with his daughter, guilt, and not wanting feel that he has failed her as a parent. Pt. Reported that he was planning to go to [name] for vacation before next session and was looking forward to not worrying about his children while he was gone.  Suicidal/Homicidal: Nowithout intent/plan   Therapist Response: Assessed patients current functioning and reviewed progress. Assisted patient processing issues with children, issues in marriage, processing for management of stressors.  Plan: Continue with CBT based therapy. Return again in two weeks.   Diagnosis: Axis I: Bipolar, Depressed  Axis II: No diagnosis   Nancie Neas, [facility] 10/01/2015

## 2015-10-11 ENCOUNTER — Ambulatory Visit (INDEPENDENT_AMBULATORY_CARE_PROVIDER_SITE_OTHER): Payer: 59 | Admitting: Psychiatry

## 2015-10-11 DIAGNOSIS — F319 Bipolar disorder, unspecified: Secondary | ICD-10-CM | POA: Diagnosis not present

## 2015-10-12 ENCOUNTER — Ambulatory Visit (INDEPENDENT_AMBULATORY_CARE_PROVIDER_SITE_OTHER): Payer: Managed Care, Other (non HMO) | Admitting: Family Medicine

## 2015-10-12 ENCOUNTER — Encounter: Payer: Self-pay | Admitting: Family Medicine

## 2015-10-12 VITALS — BP 120/90 | HR 93 | Temp 98.8°F | Ht 72.0 in | Wt 182.1 lb

## 2015-10-12 DIAGNOSIS — J019 Acute sinusitis, unspecified: Secondary | ICD-10-CM | POA: Diagnosis not present

## 2015-10-12 MED ORDER — AMOXICILLIN-POT CLAVULANATE 875-125 MG PO TABS: 1.0000 | ORAL_TABLET | Freq: Two times a day (BID) | ORAL | Status: DC

## 2015-10-12 MED ORDER — BENZONATATE 100 MG PO CAPS: 100.0000 mg | ORAL_CAPSULE | Freq: Three times a day (TID) | ORAL | Status: DC | PRN

## 2015-10-12 NOTE — Patient Instructions (Addendum)
Please take the antibiotic as prescribed.  Use the cough medication as needed per instructions.   Please follow up with your doctor if symptoms worsen, you have new concerns or if your symptoms persist despite treatment.

## 2015-10-12 NOTE — Progress Notes (Signed)
HPI:  Alex [name] is a pleasant 60 year old patient of Alex Plotner cough, with an extensive past medical history listed below, here for an acute visit for sinus congestion and cough. He reports the symptoms started at least 9 days ago and instead of getting better seem to be getting worse. Symptoms include copious amounts of nasal congestion, thick and colored at times, postnasal drip, cough and occasional mild sensation of congestion in the throat and upper chest. Denies fevers, significant shortness of breath, body aches, exposure to influenza, tooth pain or significant malaise. He has an immune deficiency disorder, and reports he is at increased risks for infections. He reports he tolerates Augmentin well and would prefer this is an antibiotic if needed.  ROS: See pertinent positives and negatives per HPI.  Past Medical History  Diagnosis Date  . Anxiety   . Hypertension   . Hyperlipidemia   . Immune deficiency disorder ([place])     COMMON VARIABLE  . GERD (gastroesophageal reflux disease)   . Glaucoma BOTH EYES -- NO DROPS   . History of thyroid cancer PRIMARY (NO METS FROM ORBITAL CANCER)--   IN REMISSION    S/P TOTAL THYROIDECTOMY  , CHEMORADIATION  (ONCOLOGIST -- DR Griffith Citron)  . 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 Renal calculi LEFT KIDNEY-- NON-OBSTRUCTIVE  . Depression   . Bipolar I disorder ([place])   . Complication of anesthesia POST URINARY RETENTION---  2006 SHOULDER SURGERY MARKED BRADYCARDIA VAGAL RESPONSE NO ISSUE W/ SURGERY AFTER THIS ONE  . BPH (benign prostatic hypertrophy) with urinary obstruction   . Positional vertigo   . Coronary atherosclerosis CARDIOLOGIST- DR CRENSHAW--  LAST VISIT 01-05-2012 IN EPIC    NON-OBSTRUCTIVE MILD DISEASE  . Urinary hesitancy   . Epicondylitis     right elbow  . Ulnar nerve  compression     right elbow  . Chronic back pain   . OA (osteoarthritis)   . Nocturia   . History of chronic prostatitis   . At risk for sleep apnea     STOP-BANG= 5    SENT TO PCP 04-22-2014  . Bleeding ulcer 06/2014    Past Surgical History  Procedure Laterality Date  . Occuloplastic surgery  2002  . Total thyroidectomy  11-03-2001    PAPILLARY THYROID CARCINOMA  . Right supraomohyoid neck dissection   03-08-2003    ZONES 1,2,3;   SUBMANDIBULAR MASS / METASTATIC SQUAMOUS CELL CARCINOMA RIGHT NECK  . Pars plana vitrectomy  11-06-2004    RIGHT EYE RADIATION RETINOPATHY W/ HEMORRHAGE  . Left hydrocelectomy  03-29-2005    AND REPAIR LEFT INGUINAL HERNIA W/ MESH  . Nasal endoscopy  08-07-2005    RIGHT EPISTAXIS  / POST SEPTOPLASTY  (HX RIGHT ORBITAL CA & S/P RADIATION/ NECROSIS ANTERIOR END OF BOTH INFERIOR TURBINATES)  . Cardiac catheterization  01-16-2006  DR THOMAS WALL    MILD CORONARY ATHEROSCLEROSIS/ MID TO DISTAL LAD 40% STENOSIS/ LVF 50-55%  . Shoulder arthroscopy w/ subacromial decompression and distal clavicle excision  10-09-2008    AND DEBRIDEMENT OF RIGHT SHOULDER IMPINGEMENT & [facility] JOINT ARTHRITIS  . Right ankle arthroscopy w/ extensive debridement  04-05-2008   . Left ankle arthroscopy w/ debridement  05-12-2007  . Mohs surgery  2002    RIGHT ORBITAL  CANCER  . Repair undesended right testicle / right inguinal hernia  AGE 58  . Septoplasty  NOV 2006  . Knee arthroscopy  05/01/2012    Procedure: ARTHROSCOPY KNEE;  Surgeon: Johnn Hai, MD;  Location: [facility];  Service: Orthopedics;  Laterality: Left;  debridement and removal of loose body  . Transthoracic echocardiogram  12/ 2012    grade I diastolic dysfunction/ ef 0000000  . Ulnar nerve transposition Right 04/28/2014    Procedure: RIGHT ELBOW ULNA NERVE RELEASE TRANSPOSTION AND MEDIAL EPICONDYLAR DEBRIDEMENT AND REPAIR;  Surgeon: Linna Hoff, MD;  Location: [place];  Service:  Orthopedics;  Laterality: Right;  . Spine surgery      Family History  Problem Relation Age of Onset  . Depression Sister   . Depression Daughter   . Drug abuse Daughter   . Kidney disease Mother   . Depression Mother   . Stroke Father   . COPD Father   . Hypertension Father     Social History   Social History  . Marital Status: Married    Spouse Name: N/A  . Number of Children: N/A  . Years of Education: N/A   Social History Main Topics  . Smoking status: Never Smoker   . Smokeless tobacco: Never Used  . Alcohol Use: No  . Drug Use: No  . Sexual Activity: Not Asked   Other Topics Concern  . None   Social History Narrative     Current outpatient prescriptions:  .  ALPHAGAN P 0.1 % SOLN, Place 1 drop into both ears 2 (two) times daily., Disp: , Rfl:  .  aspirin EC 81 MG tablet, Take 1 tablet by mouth daily., Disp: , Rfl:  .  atorvastatin (LIPITOR) 40 MG tablet, TAKE 1 TABLET (40 MG TOTAL) BY MOUTH DAILY., Disp: 90 tablet, Rfl: 0 .  AXIRON 30 MG/ACT SOLN, Place 2 Act onto the skin daily., Disp: , Rfl:  .  Coenzyme Q10 (CO Q 10 PO), Take by mouth daily., Disp: , Rfl:  .  fluconazole (DIFLUCAN) 150 MG tablet, Reported on 08/04/2015, Disp: , Rfl:  .  glycerin adult (GLYCERIN ADULT) 2 G SUPP, Place 1 suppository rectally once., Disp: 10 each, Rfl: 0 .  Immune Globulin, Human, (HIZENTRA) 4 GM/20ML SOLN, Inject into the skin once a week. wednesday, Disp: , Rfl:  .  LamoTRIgine 300 MG TB24, Take 1 tablet (300 mg total) by mouth daily., Disp: 90 tablet, Rfl: 0 .  levothyroxine (SYNTHROID) 150 MCG tablet, Take 1 tablet (150 mcg total) by mouth daily. Brand name only please., Disp: 90 tablet, Rfl: 3 .  lidocaine-prilocaine (EMLA) cream, as needed., Disp: , Rfl:  .  LORazepam (ATIVAN) 0.5 MG tablet, Take 1 tab daily as needed and 1 tab at bed time, Disp: 45 tablet, Rfl: 2 .  OMEGA-3 KRILL OIL PO, Take by mouth daily., Disp: , Rfl:  .  RABEprazole (ACIPHEX) 20 MG tablet, TAKE 1  TABLET BY MOUTH EVERY DAY--  takes in pm, Disp: , Rfl:  .  sucralfate (CARAFATE) 1 G tablet, Take 1 g by mouth 4 (four) times daily., Disp: , Rfl: 3 .  telmisartan (MICARDIS) 80 MG tablet, TAKE 1 TABLET (80 MG TOTAL) BY MOUTH DAILY.--   takes in am, Disp: 90 tablet, Rfl: 3 .  Testosterone 10 MG/ACT (2%) GEL, , Disp: , Rfl:  .  zolpidem (AMBIEN) 10 MG tablet, Take 1 tablet (10 mg total) by mouth at bedtime  as needed. for sleep, Disp: 30 tablet, Rfl: 0 .  amoxicillin-clavulanate (AUGMENTIN) 875-125 MG tablet, Take 1 tablet by mouth 2 (two) times daily., Disp: 20 tablet, Rfl: 0 .  benzonatate (TESSALON PERLES) 100 MG capsule, Take 1 capsule (100 mg total) by mouth 3 (three) times daily as needed., Disp: 20 capsule, Rfl: 0  EXAM:  Filed Vitals:   10/12/15 0941  BP: 120/90  Pulse: 93  Temp: 98.8 F (37.1 C)    Body mass index is 24.69 kg/(m^2).  GENERAL: vitals reviewed and listed above, alert, oriented, appears well hydrated and in no acute distress  HEENT: atraumatic, conjunttiva clear mildly erythematous with mild exopthalmus, no obvious abnormalities on inspection of external nose and ears, normal appearance of ear canals and TMs except for clear effusion R, thick nasal congestion, mild post oropharyngeal erythema with PND, no tonsillar edema or exudate, no sinus TTP  NECK: no obvious masses on inspection  LUNGS: clear to auscultation bilaterally, no wheezes, rales or rhonchi, good air movement  CV: HRRR, no peripheral edema  MS: moves all extremities without noticeable abnormality  PSYCH: pleasant and cooperative, no obvious depression or anxiety  ASSESSMENT AND PLAN:  Discussed the following assessment and plan:  Acute sinusitis, recurrence not specified, unspecified location  -Given worsening, duration of symptoms and his past medical history we opted to treat with an antibiotic for possible developing bacterial sinusitis. His lung exam is normal. Risks, potential  complications and return precautions discussed.We also advised that he ensure regular follow-up with his primary doctor in the next few months to reassess his blood pressure which was mildly elevated today. -Patient advised to return or notify a doctor immediately if symptoms worsen or persist or new concerns arise.  Patient Instructions  Please take the antibiotic as prescribed.  Use the cough medication as needed per instructions.   Please follow up with your doctor if symptoms worsen, you have new concerns or if your symptoms persist despite treatment.     Colin Benton R.

## 2015-10-12 NOTE — Progress Notes (Signed)
Pre visit review using our clinic review tool, if applicable. No additional management support is needed unless otherwise documented below in the visit note. 

## 2015-10-13 NOTE — Progress Notes (Signed)
   THERAPIST PROGRESS NOTE  Session Time: 2:30-3:25  Participation Level: Active   Behavioral Response: Well GroomedAlertAnxious  Type of Therapy: Individual Therapy   Treatment Goals addressed: Coping  Interventions: CBT, Motivational Interviewing, Strength-based, Supportive and Reframing   Summary: Pt. Continues to present with depression and mild anxiety. Pt. Presented for first session since his vacation in [name]. Pt. Reports that in general that his relationship with his wife has improved and feels more supported by her, but is not able to attribute to any change in his behavior. Pt. Continues to not follow through with joining a gym, mental health association for support groups, or with returning to church. Pt. Reports that his vacation was good because he was able to get away from adult children who are stressors for a few days, but that the vacation was stressful because the host had her grandchild who went with them everywhere. Pt. Reports that when he returned his son was transported to [place] and processed his concerns about his son's prognosis and questions he should consider asking his son's psychiatrist. Pt. Continues to not set age appropriate boundaries and struggles in co-dependent relationship with his adult daughter who is living in his home and not in active recovery for chemical dependency. Discussed patterns of co-dependency and need for more rigid boundaries.   Suicidal/Homicidal: Nowithout intent/plan   Therapist Response: Assessed patients current functioning and reviewed progress. Assisted patient processing issues with children, issues in marriage, processing for management of stressors.  Plan: Continue with CBT based therapy. Return again in two weeks.   Diagnosis: Axis I: Bipolar, Depressed  Axis II: No diagnosis    Nancie Neas, [facility] 10/13/2015

## 2015-10-17 ENCOUNTER — Ambulatory Visit: Payer: Self-pay | Admitting: Oncology

## 2015-10-17 ENCOUNTER — Ambulatory Visit (HOSPITAL_BASED_OUTPATIENT_CLINIC_OR_DEPARTMENT_OTHER): Payer: Managed Care, Other (non HMO) | Admitting: Oncology

## 2015-10-17 VITALS — BP 157/103 | HR 74 | Temp 97.7°F | Resp 18 | Ht 72.0 in | Wt 180.3 lb

## 2015-10-17 DIAGNOSIS — C73 Malignant neoplasm of thyroid gland: Secondary | ICD-10-CM | POA: Diagnosis not present

## 2015-10-17 DIAGNOSIS — D509 Iron deficiency anemia, unspecified: Secondary | ICD-10-CM

## 2015-10-17 DIAGNOSIS — C6960 Malignant neoplasm of unspecified orbit: Secondary | ICD-10-CM | POA: Diagnosis not present

## 2015-10-17 NOTE — Progress Notes (Signed)
[name]  Telephone:(336) [phone] Fax:(336) [phone]     ID: Alex [name] DOB: Feb 28, 1956  MR#: [id number]  [id number]  Patient Care Team: Cassandria Anger, MD as PCP - General Chauncey Cruel, MD (Hematology and Oncology) Franchot Gallo, MD as Attending Physician (Urology) Jerrell Belfast, MD (Otolaryngology) Lelon Perla, MD (Cardiology) Laurence Spates, MD as Consulting Physician (Gastroenterology) OTHER MD:  CHIEF COMPLAINT: Periorbital squamous cell carcinoma; thyroid cancer; iron deficiency anemia  CURRENT TREATMENT: Observation   INTERVAL HISTORY: Alex [name] returns today for follow-up of his periorbital squamous cell cancer and history of thyroid cancer. He also has a history of iron deficiency. However recently he has been very stable. He still has very poor lateral vision bilaterally, but he has good and regular ophthalmologic follow-up. He has had no unusual headaches, nausea, vomiting, dizziness, or gait imbalance. His thyroid function is also closely followed by his primary care physician.  REVIEW OF SYSTEMS: A detailed review of systems today was otherwise stable  PAST MEDICAL HISTORY: Past Medical History  Diagnosis Date  . Anxiety   . Hypertension   . Hyperlipidemia   . Immune deficiency disorder ([place])     COMMON VARIABLE  . GERD (gastroesophageal reflux disease)   . Glaucoma BOTH EYES -- NO DROPS   . History of thyroid cancer PRIMARY (NO METS FROM ORBITAL CANCER)--   IN REMISSION    S/P TOTAL THYROIDECTOMY  , CHEMORADIATION  (ONCOLOGIST -- DR Griffith Citron)  . 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 Renal calculi LEFT KIDNEY-- NON-OBSTRUCTIVE  . Depression   . Bipolar I disorder ([place])   . Complication of anesthesia POST URINARY RETENTION---  2006 SHOULDER SURGERY MARKED  BRADYCARDIA VAGAL RESPONSE NO ISSUE W/ SURGERY AFTER THIS ONE  . BPH (benign prostatic hypertrophy) with urinary obstruction   . Positional vertigo   . Coronary atherosclerosis CARDIOLOGIST- DR CRENSHAW--  LAST VISIT 01-05-2012 IN EPIC    NON-OBSTRUCTIVE MILD DISEASE  . Urinary hesitancy   . Epicondylitis     right elbow  . Ulnar nerve compression     right elbow  . Chronic back pain   . OA (osteoarthritis)   . Nocturia   . History of chronic prostatitis   . At risk for sleep apnea     STOP-BANG= 5    SENT TO PCP 04-22-2014  . Bleeding ulcer 06/2014    PAST SURGICAL HISTORY: Past Surgical History  Procedure Laterality Date  . Occuloplastic surgery  2002  . Total thyroidectomy  11-03-2001    PAPILLARY THYROID CARCINOMA  . Right supraomohyoid neck dissection   03-08-2003    ZONES 1,2,3;   SUBMANDIBULAR MASS / METASTATIC SQUAMOUS CELL CARCINOMA RIGHT NECK  . Pars plana vitrectomy  11-06-2004    RIGHT EYE RADIATION RETINOPATHY W/ HEMORRHAGE  . Left hydrocelectomy  03-29-2005    AND REPAIR LEFT INGUINAL HERNIA W/ MESH  . Nasal endoscopy  08-07-2005    RIGHT EPISTAXIS  / POST SEPTOPLASTY  (HX RIGHT ORBITAL CA & S/P RADIATION/ NECROSIS ANTERIOR END OF BOTH INFERIOR TURBINATES)  . Cardiac catheterization  01-16-2006  DR THOMAS WALL    MILD CORONARY ATHEROSCLEROSIS/ MID TO DISTAL LAD 40% STENOSIS/ LVF 50-55%  . Shoulder arthroscopy w/ subacromial decompression and distal clavicle excision  10-09-2008    AND DEBRIDEMENT OF RIGHT SHOULDER IMPINGEMENT & [facility] JOINT ARTHRITIS  . Right ankle arthroscopy w/ extensive debridement  04-05-2008   . Left ankle arthroscopy w/ debridement  05-12-2007  . Mohs surgery  2002    RIGHT ORBITAL CANCER  . Repair undesended right testicle / right inguinal hernia  AGE 13  . Septoplasty  NOV 2006  . Knee arthroscopy  05/01/2012    Procedure: ARTHROSCOPY KNEE;  Surgeon: Johnn Hai, MD;  [facility];  Service: Orthopedics;   Laterality: Left;  debridement and removal of loose body  . Transthoracic echocardiogram  12/ 2012    grade I diastolic dysfunction/ ef 0000000  . Ulnar nerve transposition Right 04/28/2014    Procedure: RIGHT ELBOW ULNA NERVE RELEASE TRANSPOSTION AND MEDIAL EPICONDYLAR DEBRIDEMENT AND REPAIR;  Surgeon: Linna Hoff, MD;  Location: [place];  Service: Orthopedics;  Laterality: Right;  . Spine surgery      FAMILY HISTORY Family History  Problem Relation Age of Onset  . Depression Sister   . Depression Daughter   . Drug abuse Daughter   . Kidney disease Mother   . Depression Mother   . Stroke Father   . COPD Father   . Hypertension Father     SOCIAL HISTORY:  Alex [name] is disabled secondary to his multiple medical problems. His wife Alex [name] works for [employer]. 2 of his younger children are still home. One of his children unfortunately suffers from [place] a and is currently in a behavioral health unit in [place] area the fourth child is "R problem daughter" and she is also back home with the family.    ADVANCED DIRECTIVES: Not in place   HEALTH MAINTENANCE: Social History  Substance Use Topics  . Smoking status: Never Smoker   . Smokeless tobacco: Never Used  . Alcohol Use: No     Allergies  Allergen Reactions  . Percocet [Oxycodone-Acetaminophen] Itching    Can take generic.  States only has a problem with percocet brand    Current Outpatient Prescriptions  Medication Sig Dispense Refill  . ALPHAGAN P 0.1 % SOLN Place 1 drop into both ears 2 (two) times daily.    [name] amoxicillin-clavulanate (AUGMENTIN) 875-125 MG tablet Take 1 tablet by mouth 2 (two) times daily. 20 tablet 0  . aspirin EC 81 MG tablet Take 1 tablet by mouth daily.    [name] atorvastatin (LIPITOR) 40 MG tablet TAKE 1 TABLET (40 MG TOTAL) BY MOUTH DAILY. 90 tablet 0  . AXIRON 30 MG/ACT SOLN Place 2 Act onto the skin daily.    . benzonatate (TESSALON PERLES) 100 MG capsule Take 1 capsule (100 mg  total) by mouth 3 (three) times daily as needed. 20 capsule 0  . Coenzyme Q10 (CO Q 10 PO) Take by mouth daily.    . fluconazole (DIFLUCAN) 150 MG tablet Reported on 08/04/2015    . glycerin adult (GLYCERIN ADULT) 2 G SUPP Place 1 suppository rectally once. 10 each 0  . Immune Globulin, Human, (HIZENTRA) 4 GM/20ML SOLN Inject into the skin once a week. wednesday    . LamoTRIgine 300 MG TB24 Take 1 tablet (300 mg total) by mouth daily. 90 tablet 0  . levothyroxine (SYNTHROID) 150 MCG tablet Take 1 tablet (150 mcg total) by mouth daily. Brand name only please. 90 tablet 3  . lidocaine-prilocaine (EMLA) cream as needed.    [name] LORazepam (ATIVAN) 0.5 MG tablet Take 1 tab daily as needed  and 1 tab at bed time 45 tablet 2  . OMEGA-3 KRILL OIL PO Take by mouth daily.    . RABEprazole (ACIPHEX) 20 MG tablet TAKE 1 TABLET BY MOUTH EVERY DAY--  takes in pm    . sucralfate (CARAFATE) 1 G tablet Take 1 g by mouth 4 (four) times daily.  3  . telmisartan (MICARDIS) 80 MG tablet TAKE 1 TABLET (80 MG TOTAL) BY MOUTH DAILY.--   takes in am 90 tablet 3  . Testosterone 10 MG/ACT (2%) GEL     . zolpidem (AMBIEN) 10 MG tablet Take 1 tablet (10 mg total) by mouth at bedtime as needed. for sleep 30 tablet 0   No current facility-administered medications for this visit.    OBJECTIVE: Middle-aged white male  Filed Vitals:   10/17/15 1359  BP: 157/103  Pulse: 74  Temp: 97.7 F (36.5 C)  Resp: 18     Body mass index is 24.45 kg/(m^2).    ECOG FS:1 - Symptomatic but completely ambulatory  Sclerae unicteric, Slightly injected, right greater than left; pupils round and equal, EOMs intact Oropharynx clear and moist-- no thrush or other lesions No cervical or supraclavicular adenopathy Lungs no rales or rhonchi Heart regular rate and rhythm Abd soft, nontender, positive bowel sounds MSK no focal spinal tenderness, no upper extremity lymphedema Neuro: nonfocal, well oriented, anxious affect  LAB  RESULTS:  CMP     Component Value Date/Time   NA 141 08/01/2015 0952   K 4.5 08/01/2015 0952   CL 102 08/01/2015 0952   CO2 29 08/01/2015 0952   GLUCOSE 105* 08/01/2015 0952   GLUCOSE 95 06/04/2006 0825   BUN 21 08/01/2015 0952   CREATININE 0.97 08/01/2015 0952   CALCIUM 9.5 08/01/2015 0952   PROT 7.4 08/01/2015 0952   ALBUMIN 4.6 08/01/2015 0952   AST 23 08/01/2015 0952   ALT 33 08/01/2015 0952   ALKPHOS 105 08/01/2015 0952   BILITOT 0.7 08/01/2015 0952   GFRNONAA 79* 01/05/2012 0131   GFRAA >90 01/05/2012 0131    INo results found for: SPEP, UPEP  Lab Results  Component Value Date   WBC 11.9* 08/01/2015   NEUTROABS 8.9* 08/01/2015   HGB 16.4 08/01/2015   HCT 51.0 08/01/2015   MCV 81.8 08/01/2015   PLT 344.0 08/01/2015      Chemistry      Component Value Date/Time   NA 141 08/01/2015 0952   K 4.5 08/01/2015 0952   CL 102 08/01/2015 0952   CO2 29 08/01/2015 0952   BUN 21 08/01/2015 0952   CREATININE 0.97 08/01/2015 0952      Component Value Date/Time   CALCIUM 9.5 08/01/2015 0952   ALKPHOS 105 08/01/2015 0952   AST 23 08/01/2015 0952   ALT 33 08/01/2015 0952   BILITOT 0.7 08/01/2015 0952       No results found for: LABCA2  No components found for: [id number]  No results for input(s): INR in the last 168 hours.  Urinalysis    Component Value Date/Time   COLORURINE YELLOW 08/01/2015 [place] 08/01/2015 0952   LABSPEC 1.025 08/01/2015 0952   PHURINE 5.5 08/01/2015 [place] 08/01/2015 0952   GLUCOSEU NEGATIVE 01/05/2012 0256   HGBUR NEGATIVE 08/01/2015 0952   BILIRUBINUR NEGATIVE 08/01/2015 0952   KETONESUR NEGATIVE 08/01/2015 0952   PROTEINUR NEGATIVE 01/05/2012 0256   UROBILINOGEN 0.2 08/01/2015 0952   NITRITE NEGATIVE 08/01/2015 0952   LEUKOCYTESUR NEGATIVE 08/01/2015 [id number]  STUDIES: No results found.  ASSESSMENT: 60 y.o. [place] man  (1) status post right periorbital biopsy of the squamous cell  carcinoma in November 2002, status post resection and 64 gray of radiation completed February 2003  (2) recurrence to the right perirbital lymph node drainage area, status post right supraomohyoid neck dissection July 2004 with 1 of 8 lymph nodes positive  (a) completed adjuvant radiation October 2004, 49 Gy to the upper neck, given with sensitizing Erbitux and cisplatin  (3) status post thyroidectomy for papillary thyroid carcinoma, with postoperative iodine-131 May 2003  (a) on chronic Synthroid supplementation, most recent TSH 1.73 (06/06/2014)  (4) likely alpha thalassemia trait with baseline MCV 79-81 in setting of normal hemoglobin  (5) iron deficiency secondary to gastritis/esophagitis, s/p feraheme, last dose 10/27/2015  PLAN: Alex [name] continues to be very restless, anxious, and depressed, but he is managing his multiple medical problems as best he can. Some of the situations with his family members are causing him additional stress. This is all very difficult for him, but on the plus side I don't see any evidence of active cancer at present.  That is very favorable.  I'm going to see him again in one year, with routine lab work before the visit he knows to call for any problems that we may be able to help him with before that  Chauncey Cruel, MD   10/17/2015 2:30 PM Medical Oncology and Hematology [facility] [place], [place] [address] Tel. [phone]    Fax. [phone]

## 2015-10-18 ENCOUNTER — Telehealth: Payer: Self-pay | Admitting: Oncology

## 2015-10-18 NOTE — Telephone Encounter (Signed)
gave and printed appt sched and avs for pt for March 2018

## 2015-10-19 ENCOUNTER — Telehealth: Payer: Self-pay | Admitting: Oncology

## 2015-10-19 NOTE — Telephone Encounter (Signed)
lvm for pt regarding 3.16 appts cx per pof °

## 2015-10-24 ENCOUNTER — Other Ambulatory Visit: Payer: Self-pay | Admitting: Internal Medicine

## 2015-10-30 ENCOUNTER — Ambulatory Visit (INDEPENDENT_AMBULATORY_CARE_PROVIDER_SITE_OTHER): Payer: 59 | Admitting: Psychiatry

## 2015-10-30 DIAGNOSIS — F319 Bipolar disorder, unspecified: Secondary | ICD-10-CM

## 2015-10-31 NOTE — Progress Notes (Signed)
   THERAPIST PROGRESS NOTE  Session Time: 3:10-4:00  Participation Level: Active   Behavioral Response: Well GroomedAlertAnxious  Type of Therapy: Individual Therapy   Treatment Goals addressed: Coping  Interventions: CBT, Motivational Interviewing, Strength-based, Supportive and Reframing   Summary: Pt. Continues to present with depression and mild anxiety. Pt. Reports that his relationship with his wife seems to be approving, that the two of them are relating to one another as allies as they partner to address their children's problems. Pt. Continues to complain of his daughter's sense of entitlement and selfish behavior in the household. [place] was introduced to Pt. As a source of support for him and his wife. Pt. Continues to be resistant to group support, does not like the idea of sharing his problems with others and concerned about others not being able to understand how his problems are different. This thought was normalized and Pt. Was encouraged to give thought to others will not have the same experience, but will be able to identify with themes related to co-dependence, acceptance of diagnosis, need to draw age appropriate boundaries with adult children. Pt. Reported that his son would be coming home from state hospital this week and he does not feel hopeful that he is "cured" and not sure if he and his wife will be able to keep him safe at home.  Suicidal/Homicidal: Nowithout intent/plan   Therapist Response: Assessed patients current functioning and reviewed progress. Assisted patient processing issues with children, issues in marriage, processing for management of stressors.  Plan: Continue with CBT based therapy. Return again in two weeks.   Diagnosis: Axis I: Bipolar, Depressed  Axis II: No diagnosis   Nancie Neas, [facility] 10/31/2015

## 2015-11-03 ENCOUNTER — Encounter: Payer: Self-pay | Admitting: Nurse Practitioner

## 2015-11-03 ENCOUNTER — Ambulatory Visit (INDEPENDENT_AMBULATORY_CARE_PROVIDER_SITE_OTHER): Payer: Managed Care, Other (non HMO) | Admitting: Nurse Practitioner

## 2015-11-03 VITALS — BP 128/88 | HR 77 | Temp 98.2°F | Ht 72.0 in | Wt 181.0 lb

## 2015-11-03 DIAGNOSIS — J069 Acute upper respiratory infection, unspecified: Secondary | ICD-10-CM | POA: Insufficient documentation

## 2015-11-03 MED ORDER — AZITHROMYCIN 250 MG PO TABS: ORAL_TABLET | ORAL | Status: DC

## 2015-11-03 NOTE — Progress Notes (Signed)
Patient ID: Alex [name], male    DOB: 10-13-1955  Age: 60 y.o. MRN: [id number]  CC: Cough   HPI Alex [name] [facility] presents for CC of chest congestion x 1 month.   1) Worsening cough recently  Augmentin previously when seen by Dr. 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  S/P  [facility]'S SURG AND CHEMO RADIATION---  ONCOLOIST  DR MAGRINOT  (IN REMISSION)); Renal calculi (LEFT KIDNEY-- NON-OBSTRUCTIVE); Depression; Bipolar I disorder ([place]); Complication of anesthesia (POST URINARY RETENTION---  2006 SHOULDER SURGERY MARKED BRADYCARDIA VAGAL RESPONSE NO ISSUE W/ SURGERY AFTER THIS ONE); BPH (benign prostatic hypertrophy) with urinary obstruction; Positional vertigo; Coronary atherosclerosis (CARDIOLOGIST- DR CRENSHAW--  LAST VISIT 01-05-2012 IN [facility]); Urinary hesitancy; Epicondylitis; Ulnar nerve compression; Chronic back pain; OA (osteoarthritis); Nocturia; History of chronic prostatitis; At risk for sleep apnea; and Bleeding ulcer (06/2014).   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   His family history includes COPD in his father; Depression in his daughter, mother, and sister; Drug abuse in his daughter; Hypertension in his father; Kidney disease in his mother; Stroke in his father.He reports that he has never smoked. He has never used smokeless tobacco. He reports that he does not drink alcohol or use illicit drugs.  Outpatient Prescriptions Prior to Visit  Medication Sig Dispense Refill  . ALPHAGAN P 0.1 % SOLN Place 1 drop into both ears 2 (two) times daily.    [name] aspirin EC 81 MG tablet Take 1 tablet by mouth daily.    [name] atorvastatin (LIPITOR) 40 MG tablet TAKE 1 TABLET (40 MG TOTAL) BY MOUTH DAILY. 90 tablet 3  . AXIRON 30 MG/ACT SOLN Place 2 Act onto the skin daily.    . benzonatate (TESSALON PERLES) 100 MG capsule Take 1 capsule (100 mg total) by mouth 3 (three) times daily as needed. 20 capsule 0  . Coenzyme Q10 (CO Q 10 PO) Take by mouth daily.    . fluconazole (DIFLUCAN) 150 MG tablet Reported on 08/04/2015    . glycerin adult (GLYCERIN ADULT) 2 G SUPP Place 1 suppository rectally once. 10 each 0  . Immune Globulin, Human, (HIZENTRA) 4 GM/20ML SOLN Inject into the skin once a week. wednesday    . LamoTRIgine 300 MG TB24 Take 1 tablet (300 mg total) by mouth daily. 90 tablet 0  . levothyroxine (SYNTHROID) 150 MCG tablet Take 1 tablet (150 mcg total) by mouth daily. Brand name only please. 90 tablet 3  . lidocaine-prilocaine (EMLA) cream as needed.    [name] LORazepam (  ATIVAN) 0.5 MG tablet Take 1 tab daily as needed and 1 tab at bed time 45 tablet 2  . OMEGA-3 KRILL OIL PO Take by mouth daily.    . RABEprazole (ACIPHEX) 20 MG tablet TAKE 1 TABLET BY MOUTH EVERY DAY--  takes in pm    . sucralfate  (CARAFATE) 1 G tablet Take 1 g by mouth 4 (four) times daily.  3  . telmisartan (MICARDIS) 80 MG tablet TAKE 1 TABLET (80 MG TOTAL) BY MOUTH DAILY.--   takes in am 90 tablet 3  . Testosterone 10 MG/ACT (2%) GEL     . zolpidem (AMBIEN) 10 MG tablet Take 1 tablet (10 mg total) by mouth at bedtime as needed. for sleep 30 tablet 0  . amoxicillin-clavulanate (AUGMENTIN) 875-125 MG tablet Take 1 tablet by mouth 2 (two) times daily. 20 tablet 0   No facility-administered medications prior to visit.    ROS Review of Systems  Constitutional: Negative for fever, chills, diaphoresis and fatigue.  HENT: Positive for congestion, postnasal drip, rhinorrhea, sinus pressure, sneezing and sore throat.   Respiratory: Positive for cough. Negative for chest tightness, shortness of breath and wheezing.   Cardiovascular: Negative for chest pain, palpitations and leg swelling.  Gastrointestinal: Negative for nausea, vomiting and diarrhea.  Skin: Negative for rash.  Neurological: Negative for dizziness, light-headedness and headaches.    Objective:  BP 128/88 mmHg  Pulse 77  Temp(Src) 98.2 F (36.8 C) (Oral)  Ht 6' (1.829 m)  Wt 181 lb (82.101 kg)  BMI 24.54 kg/m2  SpO2 97%  Physical Exam  Constitutional: He is oriented to person, place, and time. He appears well-developed and well-nourished. No distress.  HENT:  Head: Normocephalic and atraumatic.  Right Ear: External ear normal.  Left Ear: External ear normal.  Mouth/Throat: Oropharynx is clear and moist. No oropharyngeal exudate.  TMs clear bilaterally slight retraction Boggy turbinates  Eyes: EOM are normal. Pupils are equal, round, and reactive to light. Right eye exhibits no discharge. Left eye exhibits no discharge. No scleral icterus.  Exophthalmos and erythematous eyes bilaterally (seems to be the norm after cancer tx)   Neck: Normal range of motion. Neck supple. No thyromegaly present.  Cardiovascular: Normal rate and regular rhythm.    Pulmonary/Chest: Effort normal and breath sounds normal. No respiratory distress. He has no wheezes. He has no rales. He exhibits no tenderness.  Lymphadenopathy:    He has no cervical adenopathy.  Neurological: He is alert and oriented to person, place, and time.  Skin: Skin is warm and dry. No rash noted. He is not diaphoretic.  Psychiatric: He has a normal mood and affect. His behavior is normal. Judgment and thought content normal.      Assessment & Plan:   Blayn was seen today for cough.  Diagnoses and all orders for this visit:  Acute URI  Other orders -     azithromycin (ZITHROMAX) 250 MG tablet; Take 2 tablets by mouth on day 1, take 1 tablet by mouth each day after for 4 days.  I have discontinued Mr. Bicknell's amoxicillin-clavulanate. I am also having him start on azithromycin. Additionally, I am having him maintain his Immune Globulin (Human), Coenzyme Q10 (CO Q 10 PO), OMEGA-3 KRILL OIL PO, RABEprazole, sucralfate, telmisartan, fluconazole, Testosterone, glycerin adult, AXIRON, ALPHAGAN P, lidocaine-prilocaine, aspirin EC, levothyroxine, LamoTRIgine, zolpidem, LORazepam, benzonatate, atorvastatin, and dicyclomine.  Meds ordered this encounter  Medications  . dicyclomine (BENTYL) 20 MG tablet    Sig: TAKE 1 TABLET 3  TIMES A DAY AS NEEDED    Refill:  6  . azithromycin (ZITHROMAX) 250 MG tablet    Sig: Take 2 tablets by mouth on day 1, take 1 tablet by mouth each day after for 4 days.    Dispense:  6 each    Refill:  0    Order Specific Question:  Supervising Provider    Answer:  Crecencio Mc [[id number]]     Follow-up: Return if symptoms worsen or fail to improve.

## 2015-11-03 NOTE — Patient Instructions (Signed)
Upper Respiratory Infection, Adult Most upper respiratory infections (URIs) are a viral infection of the air passages leading to the lungs. A URI affects the nose, throat, and upper air passages. The most common type of URI is nasopharyngitis and is typically referred to as "the common cold." URIs run their course and usually go away on their own. Most of the time, a URI does not require medical attention, but sometimes a bacterial infection in the upper airways can follow a viral infection. This is called a secondary infection. Sinus and middle ear infections are common types of secondary upper respiratory infections. Bacterial pneumonia can also complicate a URI. A URI can worsen asthma and chronic obstructive pulmonary disease (COPD). Sometimes, these complications can require emergency medical care and may be life threatening.  CAUSES Almost all URIs are caused by viruses. A virus is a type of germ and can spread from one person to another.  RISKS FACTORS You may be at risk for a URI if:   You smoke.   You have chronic heart or lung disease.  You have a weakened defense (immune) system.   You are very young or very old.   You have nasal allergies or asthma.  You work in crowded or poorly ventilated areas.  You work in health care facilities or schools. SIGNS AND SYMPTOMS  Symptoms typically develop 2-3 days after you come in contact with a cold virus. Most viral URIs last 7-10 days. However, viral URIs from the influenza virus (flu virus) can last 14-18 days and are typically more severe. Symptoms may include:   Runny or stuffy (congested) nose.   Sneezing.   Cough.   Sore throat.   Headache.   Fatigue.   Fever.   Loss of appetite.   Pain in your forehead, behind your eyes, and over your cheekbones (sinus pain).  Muscle aches.  DIAGNOSIS  Your health care provider may diagnose a URI by:  Physical exam.  Tests to check that your symptoms are not due to  another condition such as:  Strep throat.  Sinusitis.  Pneumonia.  Asthma. TREATMENT  A URI goes away on its own with time. It cannot be cured with medicines, but medicines may be prescribed or recommended to relieve symptoms. Medicines may help:  Reduce your fever.  Reduce your cough.  Relieve nasal congestion. HOME CARE INSTRUCTIONS   Take medicines only as directed by your health care provider.   Gargle warm saltwater or take cough drops to comfort your throat as directed by your health care provider.  Use a warm mist humidifier or inhale steam from a shower to increase air moisture. This may make it easier to breathe.  Drink enough fluid to keep your urine clear or pale yellow.   Eat soups and other clear broths and maintain good nutrition.   Rest as needed.   Return to work when your temperature has returned to normal or as your health care provider advises. You may need to stay home longer to avoid infecting others. You can also use a face mask and careful hand washing to prevent spread of the virus.  Increase the usage of your inhaler if you have asthma.   Do not use any tobacco products, including cigarettes, chewing tobacco, or electronic cigarettes. If you need help quitting, ask your health care provider. PREVENTION  The best way to protect yourself from getting a cold is to practice good hygiene.   Avoid oral or hand contact with people with cold   symptoms.   Wash your hands often if contact occurs.  There is no clear evidence that vitamin C, vitamin E, echinacea, or exercise reduces the chance of developing a cold. However, it is always recommended to get plenty of rest, exercise, and practice good nutrition.  SEEK MEDICAL CARE IF:   You are getting worse rather than better.   Your symptoms are not controlled by medicine.   You have chills.  You have worsening shortness of breath.  You have brown or red mucus.  You have yellow or brown nasal  discharge.  You have pain in your face, especially when you bend forward.  You have a fever.  You have swollen neck glands.  You have pain while swallowing.  You have white areas in the back of your throat. SEEK IMMEDIATE MEDICAL CARE IF:   You have severe or persistent:  Headache.  Ear pain.  Sinus pain.  Chest pain.  You have chronic lung disease and any of the following:  Wheezing.  Prolonged cough.  Coughing up blood.  A change in your usual mucus.  You have a stiff neck.  You have changes in your:  Vision.  Hearing.  Thinking.  Mood. MAKE SURE YOU:   Understand these instructions.  Will watch your condition.  Will get help right away if you are not doing well or get worse.   This information is not intended to replace advice given to you by your health care provider. Make sure you discuss any questions you have with your health care provider.   Document Released: 01/22/2001 Document Revised: 12/13/2014 Document Reviewed: 11/03/2013 Elsevier Interactive Patient Education 2016 [employer]  

## 2015-11-03 NOTE — Assessment & Plan Note (Signed)
New Onset Due to length of symptoms with worsening will treat empirically  Z pac was sent to the pharmacy Encouraged Probiotics Continue OTC measures  FU prn worsening/failure to improve.

## 2015-11-03 NOTE — Progress Notes (Signed)
Pre visit review using our clinic review tool, if applicable. No additional management support is needed unless otherwise documented below in the visit note. 

## 2015-11-09 ENCOUNTER — Other Ambulatory Visit: Payer: Self-pay | Admitting: Cardiology

## 2015-11-09 NOTE — Telephone Encounter (Signed)
Rx(s) sent to pharmacy electronically.  

## 2015-11-10 ENCOUNTER — Ambulatory Visit (INDEPENDENT_AMBULATORY_CARE_PROVIDER_SITE_OTHER)
Admission: RE | Admit: 2015-11-10 | Discharge: 2015-11-10 | Disposition: A | Discharge status: Home / Self Care | Payer: Managed Care, Other (non HMO) | Source: Ambulatory Visit | Attending: Internal Medicine | Admitting: Internal Medicine

## 2015-11-10 ENCOUNTER — Telehealth: Payer: Self-pay | Admitting: Internal Medicine

## 2015-11-10 ENCOUNTER — Ambulatory Visit (INDEPENDENT_AMBULATORY_CARE_PROVIDER_SITE_OTHER): Payer: Managed Care, Other (non HMO) | Admitting: Internal Medicine

## 2015-11-10 ENCOUNTER — Emergency Department (HOSPITAL_COMMUNITY)
Admission: EM | Admit: 2015-11-10 | Discharge: 2015-11-10 | Discharge status: Absent without leave | Payer: Self-pay | Source: Home / Self Care

## 2015-11-10 VITALS — BP 118/82 | HR 88 | Temp 98.5°F | Wt 182.0 lb

## 2015-11-10 DIAGNOSIS — J209 Acute bronchitis, unspecified: Secondary | ICD-10-CM | POA: Diagnosis not present

## 2015-11-10 MED ORDER — METHYLPREDNISOLONE ACETATE 80 MG/ML IJ SUSP
80.0000 mg | Freq: Once | INTRAMUSCULAR | Status: AC
  Administered 2015-11-10: 80 mg via INTRAMUSCULAR

## 2015-11-10 MED ORDER — AMOXICILLIN-POT CLAVULANATE ER 1000-62.5 MG PO TB12
2.0000 | ORAL_TABLET | Freq: Two times a day (BID) | ORAL | Status: DC
Start: 2015-11-10 — End: 2015-11-16

## 2015-11-10 MED ORDER — FLUTICASONE FUROATE-VILANTEROL 100-25 MCG/INH IN AEPB: 1.0000 | INHALATION_SPRAY | Freq: Every day | RESPIRATORY_TRACT | Status: DC

## 2015-11-10 MED ORDER — PROMETHAZINE-CODEINE 6.25-10 MG/5ML PO SYRP: 5.0000 mL | ORAL_SOLUTION | ORAL | Status: DC | PRN

## 2015-11-10 NOTE — Progress Notes (Signed)
Pre visit review using our clinic review tool, if applicable. No additional management support is needed unless otherwise documented below in the visit note. 

## 2015-11-10 NOTE — Telephone Encounter (Signed)
Seen today Thx

## 2015-11-10 NOTE — Assessment & Plan Note (Signed)
Anoro Prom-cod syr

## 2015-11-10 NOTE — Telephone Encounter (Signed)
Pt called stated he has an appt with Plot today at 4 pm for bronchitis, pt was wondering if he can come in maybe 30 min before his appt and get the chest x-ray done so Dr. Camila Li can look at it during his appt. Please advise, is this ok?

## 2015-11-10 NOTE — Progress Notes (Signed)
Subjective:  Patient ID: Alex [name], male    DOB: 04/20/1956  Age: 60 y.o. MRN: [id number]  CC: Cough and Wheezing   HPI Alex [name] [facility] presents for cough x 6 weeks. Pt took Augmentin and recently a Zpac - not better; worse in the past few days  Outpatient Prescriptions Prior to Visit  Medication Sig Dispense Refill  . ALPHAGAN P 0.1 % SOLN Place 1 drop into both ears 2 (two) times daily.    [name] aspirin EC 81 MG tablet Take 1 tablet by mouth daily.    [name] atorvastatin (LIPITOR) 40 MG tablet TAKE 1 TABLET (40 MG TOTAL) BY MOUTH DAILY. 90 tablet 3  . AXIRON 30 MG/ACT SOLN Place 2 Act onto the skin daily.    . benzonatate (TESSALON PERLES) 100 MG capsule Take 1 capsule (100 mg total) by mouth 3 (three) times daily as needed. 20 capsule 0  . Coenzyme Q10 (CO Q 10 PO) Take by mouth daily.    [name] dicyclomine (BENTYL) 20 MG tablet TAKE 1 TABLET 3 TIMES A DAY AS NEEDED  6  . fluconazole (DIFLUCAN) 150 MG tablet Reported on 08/04/2015    . glycerin adult (GLYCERIN ADULT) 2 G SUPP Place 1 suppository rectally once. 10 each 0  . Immune Globulin, Human, (HIZENTRA) 4 GM/20ML SOLN Inject into the skin once a week. wednesday    . LamoTRIgine 300 MG TB24 Take 1 tablet (300 mg total) by mouth daily. 90 tablet 0  . levothyroxine (SYNTHROID) 150 MCG tablet Take 1 tablet (150 mcg total) by mouth daily. Brand name only please. 90 tablet 3  . lidocaine-prilocaine (EMLA) cream as needed.    [name] LORazepam (ATIVAN) 0.5 MG tablet Take 1 tab daily as needed and 1 tab at bed time 45 tablet 2  . OMEGA-3 KRILL OIL PO Take by mouth daily.    . RABEprazole (ACIPHEX) 20 MG tablet TAKE 1 TABLET BY MOUTH EVERY DAY--  takes in pm    . sucralfate (CARAFATE) 1 G tablet Take 1 g by mouth 4 (four) times daily.  3  . telmisartan (MICARDIS) 80 MG tablet Take 1 tablet (80 mg total) by mouth every morning. PLEASE CONTACT OFFICE FOR ADDITIONAL REFILLS 90 tablet 0  . Testosterone 10 MG/ACT (2%) GEL     . zolpidem (AMBIEN) 10 MG  tablet Take 1 tablet (10 mg total) by mouth at bedtime as needed. for sleep 30 tablet 0  . azithromycin (ZITHROMAX) 250 MG tablet Take 2 tablets by mouth on day 1, take 1 tablet by mouth each day after for 4 days. (Patient not taking: Reported on 11/10/2015) 6 each 0   No facility-administered medications prior to visit.    ROS Review of Systems  Constitutional: Positive for fatigue. Negative for appetite change and unexpected weight change.  HENT: Positive for congestion, rhinorrhea and sore throat. Negative for nosebleeds, sneezing and trouble swallowing.   Eyes: Negative for itching and visual disturbance.  Respiratory: Positive for cough and wheezing.   Cardiovascular: Negative for chest pain, palpitations and leg swelling.  Gastrointestinal: Negative for nausea, diarrhea, blood in stool and abdominal distention.  Genitourinary: Negative for frequency and hematuria.  Musculoskeletal: Negative for back pain, joint swelling, gait problem and neck pain.  Skin: Negative for rash.  Neurological: Negative for dizziness, tremors, speech difficulty and weakness.  Psychiatric/Behavioral: Negative for sleep disturbance, dysphoric mood and agitation. The patient is not nervous/anxious.     Objective:  BP 118/82 mmHg  Pulse 88  Temp(Src) 98.5 F (36.9 C) (Oral)  Wt 182 lb (82.555 kg)  SpO2 97%  BP Readings from Last 3 Encounters:  11/10/15 118/82  11/03/15 128/88  10/17/15 157/103    Wt Readings from Last 3 Encounters:  11/10/15 182 lb (82.555 kg)  11/03/15 181 lb (82.101 kg)  10/17/15 180 lb 4.8 oz (81.784 kg)    Physical Exam  Constitutional: He is oriented to person, place, and time. He appears well-developed. No distress.  NAD  HENT:  Mouth/Throat: Oropharynx is clear and moist.  Eyes: Conjunctivae are normal. Pupils are equal, round, and reactive to light.  Neck: Normal range of motion. No JVD present. No thyromegaly present.  Cardiovascular: Normal rate, regular rhythm,  normal heart sounds and intact distal pulses.  Exam reveals no gallop and no friction rub.   No murmur heard. Pulmonary/Chest: Effort normal and breath sounds normal. No respiratory distress. He has no wheezes. He has no rales. He exhibits no tenderness.  Abdominal: Soft. Bowel sounds are normal. He exhibits no distension and no mass. There is no tenderness. There is no rebound and no guarding.  Musculoskeletal: Normal range of motion. He exhibits no edema or tenderness.  Lymphadenopathy:    He has no cervical adenopathy.  Neurological: He is alert and oriented to person, place, and time. He has normal reflexes. No cranial nerve deficit. He exhibits normal muscle tone. He displays a negative Romberg sign. Coordination and gait normal.  Skin: Skin is warm and dry. No rash noted.  Psychiatric: He has a normal mood and affect. His behavior is normal. Judgment and thought content normal.  eryth throat   I personally provided Breo inhaler use teaching. After the teaching patient was able to demonstrate it's use effectively. All questions were answered  Lab Results  Component Value Date   WBC 11.9* 08/01/2015   HGB 16.4 08/01/2015   HCT 51.0 08/01/2015   PLT 344.0 08/01/2015   GLUCOSE 105* 08/01/2015   CHOL 178 08/01/2015   TRIG 105.0 08/01/2015   HDL 70.80 08/01/2015   LDLDIRECT 134.7 03/05/2007   LDLCALC 86 08/01/2015   ALT 33 08/01/2015   AST 23 08/01/2015   NA 141 08/01/2015   K 4.5 08/01/2015   CL 102 08/01/2015   CREATININE 0.97 08/01/2015   BUN 21 08/01/2015   CO2 29 08/01/2015   TSH 4.14 08/01/2015   PSA 1.46 08/01/2015   HGBA1C 6.1 08/04/2012    No results found.  Assessment & Plan:   There are no diagnoses linked to this encounter. I have discontinued Alex [name]'s azithromycin. I am also having him maintain his Immune Globulin (Human), Coenzyme Q10 (CO Q 10 PO), OMEGA-3 KRILL OIL PO, RABEprazole, sucralfate, fluconazole, Testosterone, glycerin adult, AXIRON, ALPHAGAN  P, lidocaine-prilocaine, aspirin EC, levothyroxine, LamoTRIgine, zolpidem, LORazepam, benzonatate, atorvastatin, dicyclomine, and telmisartan.  No orders of the defined types were placed in this encounter.     Follow-up: No Follow-up on file.  Alex Kehr, MD

## 2015-11-10 NOTE — Telephone Encounter (Signed)
Patient Name: Alex [name]  DOB: 1955-12-20    Initial Comment Caller States wheezing, productive cough, whistle sound when he breaths. has bronchitis    Nurse Assessment  Nurse: Mallie Mussel, RN, Alveta Heimlich Date/Time Eilene Ghazi Time): 11/10/2015 10:00:28 AM  Confirm and document reason for call. If symptomatic, describe symptoms. You must click the next button to save text entered. ---Caller states that he has a productive cough which began more than 4 weeks ago. It has become progressively worse. He was seen on March 3rd. He was prescribed Augmentin for sinusitis and bronchitis. He wheezes with any coughing fit. He has some head congestion, but he is breathing without difficulty. He is speaking in complete sentences. Denies fever. He has some chest pain with coughing. He rates the pain as 6-7 on 0-10 scale. He has coughed almost to the point of passing out.  Has the patient traveled out of the country within the last 30 days? ---No  Does the patient have any new or worsening symptoms? ---Yes  Will a triage be completed? ---Yes  Related visit to physician within the last 2 weeks? ---No  Does the PT have any chronic conditions? (i.e. diabetes, asthma, etc.) ---Yes  List chronic conditions. ---Multiple CA, HTN  Is this a behavioral health or substance abuse call? ---No     Guidelines    Guideline Title Affirmed Question Affirmed Notes  Cough - Acute Productive Chest pain (Exception: MILD central chest pain, present only when coughing)    Final Disposition User   Go to ED Now Mallie Mussel, RN, Alveta Heimlich    Comments  Caller declines to go to ER. He is somewhat immunocompromised. Would like to be seen at the office. Advised him I will forward this information and someone from the office will call him back.   Referrals  REFERRED TO PCP OFFICE   Disagree/Comply: Disagree  Disagree/Comply Reason: Disagree with instructions

## 2015-11-14 ENCOUNTER — Ambulatory Visit (HOSPITAL_COMMUNITY): Payer: Self-pay | Admitting: Psychiatry

## 2015-11-14 ENCOUNTER — Encounter: Payer: Self-pay | Admitting: Internal Medicine

## 2015-11-16 ENCOUNTER — Other Ambulatory Visit: Payer: Self-pay | Admitting: Internal Medicine

## 2015-11-16 NOTE — Telephone Encounter (Signed)
Pt left msg on triage stating he is needing another round of antibiotic. MD rx Augmentin for bronchitis don't think it has cleared up still coughing and spitting up phelgm. Pharmacy is sending request to have refill...Johny Chess

## 2015-11-27 ENCOUNTER — Telehealth (HOSPITAL_COMMUNITY): Payer: Self-pay

## 2015-11-27 ENCOUNTER — Ambulatory Visit (INDEPENDENT_AMBULATORY_CARE_PROVIDER_SITE_OTHER): Payer: 59 | Admitting: Psychiatry

## 2015-11-27 DIAGNOSIS — F319 Bipolar disorder, unspecified: Secondary | ICD-10-CM | POA: Diagnosis not present

## 2015-11-27 NOTE — Telephone Encounter (Signed)
He can contact his primary care physician

## 2015-11-27 NOTE — Progress Notes (Signed)
   THERAPIST PROGRESS NOTE  Session Time: 2:05-3:00  Participation Level: Active   Behavioral Response: Well GroomedAlertAnxious  Type of Therapy: Individual Therapy   Treatment Goals addressed: Coping  Interventions: CBT, Motivational Interviewing, Strength-based, Supportive and Reframing   Summary: Pt. Continues to present with depression and mild anxiety. Pt. Reports that he has been recovering from severe respiratory infection. Pt. Reports that he has been diagnosed with carpel tunnel and is concerned about having the surgery because he does not believe that his wife will be supportive or nurturing post-surgery. Pt. Also reported that his son self-harmed [facility] and had to be hospitalized again. Pt. Expressed pattern of comparing his life to hopes that he had for his life. Pt. Discussed strong religious doctrinal beliefs and separation from his connection with God. Counselor strongly recommended pastoral and/or religious counsel. Pt. Expressed his reservations because he does not believe that his wife would go with him; Counselor challenged him on his belief that his wife must attend and he was encouraged to seek out religous counsel regardless of his wife's cooperation.  Suicidal/Homicidal: Nowithout intent/plan   Therapist Response: Assessed patients current functioning and reviewed progress. Assisted patient processing issues with children, issues in marriage, processing for management of stressors.  Plan: Continue with CBT based therapy. Return again in two weeks.   Diagnosis: Axis I: Bipolar, Depressed  Axis II: No diagnosis   Nancie Neas, [facility] 11/27/2015

## 2015-11-27 NOTE — Telephone Encounter (Signed)
Called the patient and lvm letting him know to call his PCP

## 2015-11-27 NOTE — Telephone Encounter (Signed)
Patient came in to weigh himself - weight was 174.4, patient states that 3 weeks ago he was 182 and is concerned by the weight loss. He wanted you to know

## 2015-12-19 ENCOUNTER — Encounter (HOSPITAL_COMMUNITY): Payer: Self-pay | Admitting: Psychiatry

## 2015-12-19 ENCOUNTER — Other Ambulatory Visit (HOSPITAL_COMMUNITY): Payer: Self-pay | Admitting: Psychiatry

## 2015-12-19 ENCOUNTER — Ambulatory Visit (INDEPENDENT_AMBULATORY_CARE_PROVIDER_SITE_OTHER): Payer: 59 | Admitting: Psychiatry

## 2015-12-19 VITALS — BP 116/74 | HR 85 | Ht 73.0 in | Wt 176.8 lb

## 2015-12-19 DIAGNOSIS — F319 Bipolar disorder, unspecified: Secondary | ICD-10-CM

## 2015-12-19 DIAGNOSIS — F3162 Bipolar disorder, current episode mixed, moderate: Secondary | ICD-10-CM

## 2015-12-19 MED ORDER — LAMOTRIGINE ER 300 MG PO TB24: 1.0000 | ORAL_TABLET | Freq: Every day | ORAL | Status: DC

## 2015-12-19 MED ORDER — ZOLPIDEM TARTRATE 10 MG PO TABS: 10.0000 mg | ORAL_TABLET | Freq: Every evening | ORAL | Status: DC | PRN

## 2015-12-19 MED ORDER — LORAZEPAM 0.5 MG PO TABS: ORAL_TABLET | ORAL | Status: DC

## 2015-12-19 NOTE — Progress Notes (Signed)
[place] Progress Note  Alex [name] [id number] 60 y.o.  12/19/2015 2:37 PM  Chief Complaint:  Medication management and follow-up.     History of Present Illness:  Alex [name] came for his followup appointment.  He is taking his medication as prescribed.  He is taking Ambien 10 mg half to one tablet as needed for insomnia and taking Ativan 0.5 mg every night and sometimes during the day.  He continues to stressed about his daughter living with him but does not work and feeling entitled for everything.  Patient told he has no other place to put her daughter.  His son is somewhat better and working on and off odd job.  He is pleased that son is seeing psychiatrist at [facility].  He's taking Lamictal and reported no side effects.  His mood swing irritability anger and mania is under control.  He denies any feeling of hopelessness or worthlessness.  He is seeing Eloise Levels for counseling.  He has no rash or any shakes.  Recently he seen his primary care physician for sinusitis and given antibiotic.  He lost weight from the past because he has decreased appetite but now his appetite is improving.  Patient denies drinking or using any illegal substances.  His energy level is fair.    Suicidal Ideation: No Plan Formed: No Patient has means to carry out plan: No  Homicidal Ideation: No Plan Formed: No Patient has means to carry out plan: No  Review of Systems: Psychiatric: Agitation: No Hallucination: No Depressed Mood: No Insomnia: No Hypersomnia: No Altered Concentration: No Feels Worthless: No Grandiose Ideas: No Belief In Special Powers: No New/Increased Substance Abuse: No Compulsions: No  Neurologic: Headache: No Seizure: No Paresthesias: No  Medical  history Patient has history of hypertension , immune deficiency syndrome , GERD , Tick disorder, coronary artery disease, hypertension, COPD, esophagus stricture, lumbago, osteoarthritis, malignant  neoplasm of orbit, vertigo, osteoarthritis and chronic fatigue.   Outpatient Encounter Prescriptions as of 12/19/2015  Medication Sig  . ALPHAGAN P 0.1 % SOLN Place 1 drop into both ears 2 (two) times daily.  [name] amoxicillin-clavulanate (AUGMENTIN XR) 1000-62.5 MG 12 hr tablet TAKE 2 TABLETS BY MOUTH TWICE A DAY  . aspirin EC 81 MG tablet Take 1 tablet by mouth daily.  [name] atorvastatin (LIPITOR) 40 MG tablet TAKE 1 TABLET (40 MG TOTAL) BY MOUTH DAILY.  [name] AXIRON 30 MG/ACT SOLN Place 2 Act onto the skin daily.  . benzonatate (TESSALON PERLES) 100 MG capsule Take 1 capsule (100 mg total) by mouth 3 (three) times daily as needed.  . Coenzyme Q10 (CO Q 10 PO) Take by mouth daily.  [name] dicyclomine (BENTYL) 20 MG tablet TAKE 1 TABLET 3 TIMES A DAY AS NEEDED  . fluconazole (DIFLUCAN) 150 MG tablet Reported on 08/04/2015  . fluticasone furoate-vilanterol (BREO ELLIPTA) 100-25 MCG/INH AEPB Inhale 1 puff into the lungs daily.  [name] glycerin adult (GLYCERIN ADULT) 2 G SUPP Place 1 suppository rectally once.  . Immune Globulin, Human, (HIZENTRA) 4 GM/20ML SOLN Inject into the skin once a week. wednesday  . LamoTRIgine 300 MG TB24 Take 1 tablet (300 mg total) by mouth daily.  [name] levothyroxine (SYNTHROID) 150 MCG tablet Take 1 tablet (150 mcg total) by mouth daily. Brand name only please.  . lidocaine-prilocaine (EMLA) cream as needed.  [name] LORazepam (ATIVAN) 0.5 MG tablet Take 1 tab daily as needed and 1 tab at bed time  . OMEGA-3 KRILL OIL PO Take by  mouth daily.  . promethazine-codeine (PHENERGAN WITH CODEINE) 6.25-10 MG/5ML syrup Take 5 mLs by mouth every 4 (four) hours as needed.  . RABEprazole (ACIPHEX) 20 MG tablet TAKE 1 TABLET BY MOUTH EVERY DAY--  takes in pm  . sucralfate (CARAFATE) 1 G tablet Take 1 g by mouth 4 (four) times daily.  [name] telmisartan (MICARDIS) 80 MG tablet Take 1 tablet (80 mg total) by mouth every morning. PLEASE CONTACT OFFICE FOR ADDITIONAL REFILLS  . Testosterone 10 MG/ACT (2%) GEL   .  zolpidem (AMBIEN) 10 MG tablet Take 1 tablet (10 mg total) by mouth at bedtime as needed. for sleep  . [DISCONTINUED] LamoTRIgine 300 MG TB24 Take 1 tablet (300 mg total) by mouth daily.  . [DISCONTINUED] LORazepam (ATIVAN) 0.5 MG tablet Take 1 tab daily as needed and 1 tab at bed time  . [DISCONTINUED] zolpidem (AMBIEN) 10 MG tablet Take 1 tablet (10 mg total) by mouth at bedtime as needed. for sleep   No facility-administered encounter medications on file as of 12/19/2015.    Past Psychiatric History/Hospitalization(s): Patient has been seeing in this office since 2009.  He has history of depression mood swing and anger.  He's been admitted to behavioral [place] due to suicidal thoughts but he denies any suicidal attempt.  He denies any psychosis but admitted poor impulse control.  In the past he has taken Seroquel and Cymbalta.   Anxiety: Yes Bipolar Disorder: Yes Depression: Yes Mania: No Psychosis: No Schizophrenia: No Personality Disorder: No Hospitalization for psychiatric illness: Yes History of Electroconvulsive Shock Therapy: No Prior Suicide Attempts: No  Physical Exam: Constitutional:  BP 116/74 mmHg  Pulse 85  Ht 6\' 1"  (1.854 m)  Wt 176 lb 12.8 oz (80.196 kg)  BMI 23.33 kg/m2  General Appearance: alert, oriented, no acute distress  Musculoskeletal: Strength & Muscle Tone: within normal limits Gait & Station: normal Patient leans: N/A  Review of Systems  Constitutional: Negative for fever.  Musculoskeletal: Positive for back pain.  Skin: Negative for itching and rash.  Neurological: Negative for dizziness and headaches.  Psychiatric/Behavioral: Negative for depression, suicidal ideas, hallucinations and substance abuse. The patient is nervous/anxious. The patient does not have insomnia.    No results found for this or any previous visit (from the past 2160 hour(s)).  Mental status examination Patient is casually dressed and fairly groomed.  He is anxious  but maintained a good eye contact.  He described his mood euthymic and affect is appropriate.  He denies any active or passive suicidal thoughts or homicidal thoughts.  He denies any auditory or visual hallucination.  He is restless but there were no tremors or shakes present.   His thought process is logical linear and goal-directed.  There were no flight of ideas present at this time.  His fund of knowledge is adequate.  His attention and concentration is okay.  He's alert and oriented x3.  His insight judgment and impulse control is okay.   Established Problem, Stable/Improving (1), Review of Psycho-Social Stressors (1), Review of Last Therapy Session (1) and Review of Medication Regimen & Side Effects (2)  Assessment: Axis I:  bipolar disorder1  Axis II:  deferred  Axis III:  see medical history   Patient Active Problem List   Diagnosis Date Noted  . Acute URI 11/03/2015  . Cerumen impaction 08/04/2015  . Cervical disc disorder with radiculopathy of cervical region 06/06/2014  . Right elbow pain 06/06/2014  . Elevated WBC count 06/06/2014  . Iron  deficiency anemia 06/06/2014  . Pre-operative exam 04/07/2014  . Chronic right SI joint pain 02/21/2014  . Edema 02/21/2014  . Tinnitus 02/21/2014  . Well adult exam 05/31/2013  . Glaucoma 05/31/2013  . RML pneumonia 03/31/2013  . Fever, unspecified 03/26/2013  . Hypertension, uncontrolled 02/25/2013  . Preop exam for internal medicine 04/02/2012  . Ingrowing toenail with infection 04/02/2012  . Localized osteoarthritis of left knee 04/02/2012  . Vertigo 01/05/2012  . Ataxia 01/05/2012  . Anxiety state 08/08/2010  . Bronchospasm with bronchitis, acute 11/30/2009  . ESOPHAGEAL STRICTURE 11/30/2009  . CHANGE IN BOWELS 11/30/2009  . Major depression, chronic ([place]) 08/07/2009  . Osteoarthritis 08/07/2009  . GENERALIZED OSTEOARTHROSIS UNSPECIFIED SITE 10/20/2008  . HYPERLIPIDEMIA-MIXED 10/19/2008  . FOOT PAIN 07/11/2008  . Orchitis  and epididymitis 05/24/2008  . FATIGUE 05/14/2008  . RASH-NONVESICULAR 05/14/2008  . Cough 03/29/2008  . Coronary atherosclerosis 11/04/2007  . Lumbago 11/04/2007  . THYROIDECTOMY, HX OF 11/04/2007  . Malignant neoplasm of orbit ([place]) 10/02/2007  . NEOPLASM, MALIGNANT, THYROID GLAND 10/02/2007  . 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[place]) 10/02/2007  . IMPAIRED GLUCOSE TOLERANCE 10/02/2007    Plan:  Patient is a stable on his current medication.  He does not want to change his current psychiatric medication.  Continue lorazepam 0.5 mg at bedtime and during the day as needed.  Continue Ambien 10 mg half to one tablet as needed and Lamictal 300 mg daily. Discussed medication side effects and benefits. Encouraged to keep appointment with Eloise Levels for counseling.  Discussed benzodiazepine dependency, withdrawal and tolerance in detail. Recommended to call us back if he has any question or any concern.  Discuss safety plan that anytime having active suicidal thoughts or homicidal thoughts and he need to call 911 or go to the local emergency room. follow-up in 3 months   ARFEEN,SYED T., MD 12/19/2015

## 2015-12-20 ENCOUNTER — Other Ambulatory Visit (HOSPITAL_COMMUNITY): Payer: Self-pay | Admitting: Psychiatry

## 2016-01-01 ENCOUNTER — Ambulatory Visit (INDEPENDENT_AMBULATORY_CARE_PROVIDER_SITE_OTHER): Payer: 59 | Admitting: Psychiatry

## 2016-01-01 DIAGNOSIS — F3162 Bipolar disorder, current episode mixed, moderate: Secondary | ICD-10-CM

## 2016-01-02 NOTE — Progress Notes (Signed)
   THERAPIST PROGRESS NOTE  Session Time: 3:10-4:00  Participation Level: Active   Behavioral Response: Well GroomedAlertAnxious  Type of Therapy: Individual Therapy   Treatment Goals addressed: Coping  Interventions: CBT, Motivational Interviewing, Strength-based, Supportive and Reframing   Summary: Pt. Continues to present with depression and mild anxiety. Pt. Reports that his son is hospitalized again since our last session. Pt. Reports that his daughter is preparing for extensive chin and nose plastic surgery that is being paid by his wife's friend who enables her drug seeking behavior. Pt. Expressed frustration and resentment toward his adult children who have chronic mental illnesses. Pt. Was asked "What would it be like for you to accept that your son has a chronic and severe mental illness. And that because of his mental illness, the life that you planned for yourself- the empty nest will be different." Pt. Reported that acceptance of this would be very hard for him and more difficult than if it were a physical health condition. Pt. Discussed awareness that living with resentment towards his children has been very painful and that acceptance would mean letting go of the hopes that he had for his children and accepting the reality of who they are. Pt. Is beginning the process of the planning required for having one adult child who is dependent because of his mental health disability and another adult child because of patterns of co-dependence in his family.  Suicidal/Homicidal: Nowithout intent/plan   Therapist Response: Assessed patients current functioning and reviewed progress. Assisted patient processing issues with children, issues in marriage, processing for management of stressors.  Plan: Continue with CBT based therapy. Return again in two weeks.   Diagnosis: Axis I: Bipolar, Depressed  Axis II: No diagnosis   Nancie Neas, [facility] 01/02/2016

## 2016-01-02 NOTE — Progress Notes (Signed)
HPI: FU CAD. Cath in 2007 showed a 40 LAD; EF 50-55. Had normal ETT 7/11 with 10:00 on [place]. Echocardiogram December 2012 showed normal LV function and grade 1 diastolic dysfunction. Holter monitor in January 2014 showed sinus rhythm, PACs, PVCs and brief PAT. Since he was last seen, He denies dyspnea, chest pain, palpitations or syncope.  Current Outpatient Prescriptions  Medication Sig Dispense Refill  . ALPHAGAN P 0.1 % SOLN Place 1 drop into both ears 2 (two) times daily.    [name] aspirin EC 81 MG tablet Take 1 tablet by mouth daily.    [name] atorvastatin (LIPITOR) 40 MG tablet TAKE 1 TABLET (40 MG TOTAL) BY MOUTH DAILY. 90 tablet 3  . AXIRON 30 MG/ACT SOLN Place 2 Act onto the skin daily.    . Coenzyme Q10 (CO Q 10 PO) Take by mouth daily.    [name] dicyclomine (BENTYL) 20 MG tablet TAKE 1 TABLET 3 TIMES A DAY AS NEEDED  6  . fluconazole (DIFLUCAN) 150 MG tablet Reported on 08/04/2015    . fluticasone furoate-vilanterol (BREO ELLIPTA) 100-25 MCG/INH AEPB Inhale 1 puff into the lungs daily. 1 each 3  . Immune Globulin, Human, (HIZENTRA) 4 GM/20ML SOLN Inject into the skin once a week. wednesday    . LamoTRIgine 300 MG TB24 Take 1 tablet (300 mg total) by mouth daily. 90 tablet 0  . levothyroxine (SYNTHROID) 150 MCG tablet Take 1 tablet (150 mcg total) by mouth daily. Brand name only please. 90 tablet 3  . lidocaine-prilocaine (EMLA) cream as needed.    [name] LORazepam (ATIVAN) 0.5 MG tablet Take 1 tab daily as needed and 1 tab at bed time 45 tablet 2  . OMEGA-3 KRILL OIL PO Take by mouth daily.    . promethazine-codeine (PHENERGAN WITH CODEINE) 6.25-10 MG/5ML syrup Take 5 mLs by mouth every 4 (four) hours as needed. 300 mL 0  . RABEprazole (ACIPHEX) 20 MG tablet TAKE 1 TABLET BY MOUTH EVERY DAY--  takes in pm    . sucralfate (CARAFATE) 1 G tablet Take 1 g by mouth 4 (four) times daily.  3  . telmisartan (MICARDIS) 80 MG tablet Take 1 tablet (80 mg total) by mouth every morning. PLEASE CONTACT  OFFICE FOR ADDITIONAL REFILLS 90 tablet 0  . zolpidem (AMBIEN) 10 MG tablet Take 1 tablet (10 mg total) by mouth at bedtime as needed. for sleep 30 tablet 0   No current facility-administered medications for this visit.     Past Medical History  Diagnosis Date  . Anxiety   . Hypertension   . Hyperlipidemia   . Immune deficiency disorder ([place])     COMMON VARIABLE  . GERD (gastroesophageal reflux disease)   . Glaucoma BOTH EYES -- NO DROPS   . History of thyroid cancer PRIMARY (NO METS FROM ORBITAL CANCER)--   IN REMISSION    S/P TOTAL THYROIDECTOMY  , CHEMORADIATION  (ONCOLOGIST -- DR Griffith Citron)  . 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 Renal calculi LEFT KIDNEY-- NON-OBSTRUCTIVE  . Depression   . Bipolar I disorder ([place])   . Complication of anesthesia POST URINARY RETENTION---  2006 SHOULDER SURGERY MARKED BRADYCARDIA VAGAL RESPONSE NO ISSUE W/ SURGERY AFTER THIS ONE  . BPH (benign prostatic hypertrophy)  with urinary obstruction   . Positional vertigo   . Coronary atherosclerosis CARDIOLOGIST- DR Talisha Erby--  LAST VISIT 01-05-2012 IN [facility]    NON-OBSTRUCTIVE MILD DISEASE  . Urinary hesitancy   . Epicondylitis     right elbow  . Ulnar nerve compression     right elbow  . Chronic back pain   . OA (osteoarthritis)   . Nocturia   . History of chronic prostatitis   . At risk for sleep apnea     STOP-BANG= 5    SENT TO PCP 04-22-2014  . Bleeding ulcer 06/2014    Past Surgical History  Procedure Laterality Date  . Occuloplastic surgery  2002  . Total thyroidectomy  11-03-2001    PAPILLARY THYROID CARCINOMA  . Right supraomohyoid neck dissection   03-08-2003    ZONES 1,2,3;   SUBMANDIBULAR MASS / METASTATIC SQUAMOUS CELL CARCINOMA RIGHT NECK  . Pars plana vitrectomy  11-06-2004    RIGHT EYE RADIATION RETINOPATHY  W/ HEMORRHAGE  . Left hydrocelectomy  03-29-2005    AND REPAIR LEFT INGUINAL HERNIA W/ MESH  . Nasal endoscopy  08-07-2005    RIGHT EPISTAXIS  / POST SEPTOPLASTY  (HX RIGHT ORBITAL CA & S/P RADIATION/ NECROSIS ANTERIOR END OF BOTH INFERIOR TURBINATES)  . Cardiac catheterization  01-16-2006  DR THOMAS WALL    MILD CORONARY ATHEROSCLEROSIS/ MID TO DISTAL LAD 40% STENOSIS/ LVF 50-55%  . Shoulder arthroscopy w/ subacromial decompression and distal clavicle excision  10-09-2008    AND DEBRIDEMENT OF RIGHT SHOULDER IMPINGEMENT & [facility] JOINT ARTHRITIS  . Right ankle arthroscopy w/ extensive debridement  04-05-2008   . Left ankle arthroscopy w/ debridement  05-12-2007  . Mohs surgery  2002    RIGHT ORBITAL CANCER  . Repair undesended right testicle / right inguinal hernia  AGE 60  . Septoplasty  NOV 2006  . Knee arthroscopy  05/01/2012    Procedure: ARTHROSCOPY KNEE;  Surgeon: Johnn Hai, MD;  Location: [facility];  Service: Orthopedics;  Laterality: Left;  debridement and removal of loose body  . Transthoracic echocardiogram  12/ 2012    grade I diastolic dysfunction/ ef 0000000  . Ulnar nerve transposition Right 04/28/2014    Procedure: RIGHT ELBOW ULNA NERVE RELEASE TRANSPOSTION AND MEDIAL EPICONDYLAR DEBRIDEMENT AND REPAIR;  Surgeon: Linna Hoff, MD;  Location: [facility];  Service: Orthopedics;  Laterality: Right;  . Spine surgery      Social History   Social History  . Marital Status: Married    Spouse Name: N/A  . Number of Children: N/A  . Years of Education: N/A   Occupational History  . Not on file.   Social History Main Topics  . Smoking status: Never Smoker   . Smokeless tobacco: Never Used  . Alcohol Use: No  . Drug Use: No  . Sexual Activity: Not on file   Other Topics Concern  . Not on file   Social History Narrative    Family History  Problem Relation Age of Onset  . Depression Sister   . Depression Daughter   . Drug abuse  Daughter   . Kidney disease Mother   . Depression Mother   . Stroke Father   . COPD Father   . Hypertension Father     ROS: no fevers or chills, productive cough, hemoptysis, dysphasia, odynophagia, melena, hematochezia, dysuria, hematuria, rash, seizure activity, orthopnea, PND, pedal edema, claudication. Remaining systems are negative.  Physical Exam: Well-developed well-nourished in no acute  distress.  Skin is warm and dry.  HEENT is normal.  Neck is supple.  Chest is clear to auscultation with normal expansion.  Cardiovascular exam is regular rate and rhythm.  Abdominal exam nontender or distended. No masses palpated. Extremities show no edema. neuro grossly intact  ECG Sinus rhythm at a rate of 74. No ST changes.

## 2016-01-04 ENCOUNTER — Ambulatory Visit (INDEPENDENT_AMBULATORY_CARE_PROVIDER_SITE_OTHER): Payer: Managed Care, Other (non HMO) | Admitting: Cardiology

## 2016-01-04 ENCOUNTER — Encounter: Payer: Self-pay | Admitting: Cardiology

## 2016-01-04 VITALS — BP 144/90 | HR 74 | Ht 73.0 in | Wt 179.2 lb

## 2016-01-04 DIAGNOSIS — I2510 Atherosclerotic heart disease of native coronary artery without angina pectoris: Secondary | ICD-10-CM

## 2016-01-04 MED ORDER — AMLODIPINE BESYLATE 5 MG PO TABS: 5.0000 mg | ORAL_TABLET | Freq: Every day | ORAL | Status: DC

## 2016-01-04 NOTE — Assessment & Plan Note (Signed)
Continue statin. 

## 2016-01-04 NOTE — Assessment & Plan Note (Signed)
Continue aspirin and statin. No recent chest pain.

## 2016-01-04 NOTE — Patient Instructions (Signed)
Medication Instructions:   START AMLODIPINE 5 MG ONCE DAILY  Follow-Up:  Your physician wants you to follow-up in: ONE YEAR WITH DR CRENSHAW  You will receive a reminder letter in the mail two months in advance. If you don't receive a letter, please call our office to schedule the follow-up appointment.   If you need a refill on your cardiac medications before your next appointment, please call your pharmacy.    

## 2016-01-04 NOTE — Assessment & Plan Note (Signed)
Blood pressure elevated. Add amlodipine 5 mg daily. 

## 2016-01-15 ENCOUNTER — Ambulatory Visit (INDEPENDENT_AMBULATORY_CARE_PROVIDER_SITE_OTHER): Payer: 59 | Admitting: Psychiatry

## 2016-01-15 DIAGNOSIS — F3162 Bipolar disorder, current episode mixed, moderate: Secondary | ICD-10-CM | POA: Diagnosis not present

## 2016-01-16 NOTE — Progress Notes (Signed)
   THERAPIST PROGRESS NOTE  Session Time: 3:20-4:20  Participation Level: Active   Behavioral Response: Well GroomedAlertAnxious  Type of Therapy: Individual Therapy   Treatment Goals addressed: Coping; managing depression and anxiety  Interventions: CBT, Motivational Interviewing, Strength-based, Supportive and Reframing   Summary: Pt. Continues to present with depression and mild anxiety. Pt. Reports that his son was admitted to the hospital again since out last session for puncture to his lung. Pt. Recognizes that he is not able to keep his son safe at home and is frustrated by lack of treatment options for him. Pt. Also is frustrated by his daughter's selfish, entitled and drug seeking behavior, but has not been able to set healthy boundaries with her. Pt. Was referred to two [facility] fellowships and encouraged to go to several until he finds a fellowship that is comfortable and to begin working on pattens of enabling and co-dependence that are compounding his depression. Pt. Discussed plans to see movie on [employer]ian[employer] christian[employer] and plans to go to the beach next week. Pt. Was encouraged to continue seeking leisure and enjoyable activities and prioritizing his self-care.  Suicidal/Homicidal: Nowithout intent/plan   Therapist Response: Assessed patients current functioning and reviewed progress. Assisted patient processing issues with children, issues in marriage, processing for management of stressors.  Plan: Continue with CBT based therapy. Return again in two weeks.   Diagnosis: Axis I: Bipolar, Depressed  Axis II: No diagnosis  Nancie Neas, [facility] 01/16/2016

## 2016-01-18 ENCOUNTER — Telehealth: Payer: Self-pay

## 2016-01-18 NOTE — Telephone Encounter (Signed)
Allergie parnters called and ask for you to fax over the most recent lab work you have on this patient. Fax [phone]

## 2016-01-19 ENCOUNTER — Telehealth: Payer: Self-pay | Admitting: Internal Medicine

## 2016-01-19 NOTE — Telephone Encounter (Signed)
08/01/15 labs faxed to number below.

## 2016-01-19 NOTE — Telephone Encounter (Signed)
Patient called to advise that he has a recurrence of his chronic bronchitis. Worse at night. States that he does not want to utilize an urgent care. Declined an appt tomorrow. No availability today. He is asking for you to call in another round of prednisone and the cough syrup,. He is going out of town tomorrow on vacation.

## 2016-01-22 MED ORDER — PROMETHAZINE-CODEINE 6.25-10 MG/5ML PO SYRP: 5.0000 mL | ORAL_SOLUTION | ORAL | Status: DC | PRN

## 2016-01-22 MED ORDER — PREDNISONE 10 MG PO TABS: ORAL_TABLET | ORAL | Status: DC

## 2016-01-22 NOTE — Telephone Encounter (Signed)
Noted Has it been taken care of? If not - pls call in - see Rx Thx

## 2016-01-23 NOTE — Telephone Encounter (Signed)
Pt called back. He is no better and is at [facility]. [facility] [address]  in [place] ph # is [phone]. Both meds phoned in. Pt aware. He is requesting a referral to pulmonary. He will be home on 01/27/16.

## 2016-01-23 NOTE — Telephone Encounter (Signed)
Left detailed mess for pt to call back.

## 2016-01-29 ENCOUNTER — Ambulatory Visit (INDEPENDENT_AMBULATORY_CARE_PROVIDER_SITE_OTHER): Payer: 59 | Admitting: Psychiatry

## 2016-01-29 DIAGNOSIS — F3162 Bipolar disorder, current episode mixed, moderate: Secondary | ICD-10-CM | POA: Diagnosis not present

## 2016-01-31 NOTE — Progress Notes (Signed)
   THERAPIST PROGRESS NOTE  Session Time: 3:05-4:00  Participation Level: Active   Behavioral Response: Well GroomedAlertAnxious  Type of Therapy: Individual Therapy   Treatment Goals addressed: Coping; managing depression and anxiety  Interventions: CBT, Motivational Interviewing, Strength-based, Supportive and Reframing   Summary: Pt. Continues to present with depression and mild anxiety. Pt. Discussed that he had a good vacation with his family and it helped with his relief of stress. Pt. Reported that his daughter continues to be drug seeking and he continues to struggle with setting healthy boundaries with his daughter. Pt. Also discussed recent disclosure from his son connecting his self-injury to relief from emotional pain due to childhood trauma. Pt. Reviewed work related to commitment to self-care, understanding patterns co-dependency, and setting healthy boundaries. Counselor introduced termination and transitioned Pt. To new therapist. Pt. Was cooperative and was introduced to new therapist.  Suicidal/Homicidal: Nowithout intent/plan   Therapist Response: Assessed patients current functioning and reviewed progress. Assisted patient processing issues with children, issues in marriage, processing for management of stressors.  Plan: Pt. Will continue his counseling with [name].  Diagnosis: Axis I: Bipolar, Depressed  Axis II: No diagnosis   Nancie Neas, [facility] 01/31/2016

## 2016-02-02 ENCOUNTER — Other Ambulatory Visit (INDEPENDENT_AMBULATORY_CARE_PROVIDER_SITE_OTHER): Payer: Managed Care, Other (non HMO)

## 2016-02-02 ENCOUNTER — Ambulatory Visit (INDEPENDENT_AMBULATORY_CARE_PROVIDER_SITE_OTHER): Payer: Managed Care, Other (non HMO) | Admitting: Internal Medicine

## 2016-02-02 ENCOUNTER — Encounter: Payer: Self-pay | Admitting: Internal Medicine

## 2016-02-02 VITALS — BP 130/80 | HR 92 | Wt 176.0 lb

## 2016-02-02 DIAGNOSIS — J209 Acute bronchitis, unspecified: Secondary | ICD-10-CM

## 2016-02-02 DIAGNOSIS — I2510 Atherosclerotic heart disease of native coronary artery without angina pectoris: Secondary | ICD-10-CM

## 2016-02-02 DIAGNOSIS — E291 Testicular hypofunction: Secondary | ICD-10-CM | POA: Diagnosis not present

## 2016-02-02 DIAGNOSIS — E785 Hyperlipidemia, unspecified: Secondary | ICD-10-CM

## 2016-02-02 DIAGNOSIS — J069 Acute upper respiratory infection, unspecified: Secondary | ICD-10-CM | POA: Diagnosis not present

## 2016-02-02 DIAGNOSIS — D839 Common variable immunodeficiency, unspecified: Secondary | ICD-10-CM

## 2016-02-02 DIAGNOSIS — K222 Esophageal obstruction: Secondary | ICD-10-CM

## 2016-02-02 DIAGNOSIS — C6960 Malignant neoplasm of unspecified orbit: Secondary | ICD-10-CM

## 2016-02-02 LAB — BASIC METABOLIC PANEL
BUN: 24 mg/dL — ABNORMAL HIGH (ref 6–23)
CO2: 29 mEq/L (ref 19–32)
Calcium: 8.8 mg/dL (ref 8.4–10.5)
Chloride: 101 mEq/L (ref 96–112)
Creatinine, Ser: 1.08 mg/dL (ref 0.40–1.50)
GFR: 73.6 mL/min (ref >60.00)
Glucose, Bld: 131 mg/dL — ABNORMAL HIGH (ref 70–99)
Potassium: 4.1 mEq/L (ref 3.5–5.1)
Sodium: 137 mEq/L (ref 135–145)

## 2016-02-02 LAB — HEPATIC FUNCTION PANEL
ALT: 32 U/L (ref 0–53)
AST: 19 U/L (ref 0–37)
Albumin: 4.3 g/dL (ref 3.5–5.2)
Alkaline Phosphatase: 80 U/L (ref 39–117)
Bilirubin, Direct: 0.2 mg/dL (ref 0.0–0.3)
Total Bilirubin: 0.7 mg/dL (ref 0.2–1.2)
Total Protein: 6.9 g/dL (ref 6.0–8.3)

## 2016-02-02 LAB — LIPID PANEL
Cholesterol: 170 mg/dL (ref 0–200)
HDL: 73.3 mg/dL (ref >39.00)
Total CHOL/HDL Ratio: 2
Triglycerides: 420 mg/dL — ABNORMAL HIGH (ref 0.0–149.0)

## 2016-02-02 LAB — PSA: PSA: 1.58 ng/mL (ref 0.10–4.00)

## 2016-02-02 LAB — TESTOSTERONE: Testosterone: 210.5 ng/dL — ABNORMAL LOW (ref 300.00–890.00)

## 2016-02-02 LAB — LDL CHOLESTEROL, DIRECT: Direct LDL: 59 mg/dL

## 2016-02-02 MED ORDER — FLUCONAZOLE 150 MG PO TABS: 150.0000 mg | ORAL_TABLET | Freq: Once | ORAL | Status: DC

## 2016-02-02 MED ORDER — SYNTHROID 150 MCG PO TABS: 150.0000 ug | ORAL_TABLET | Freq: Every day | ORAL | Status: DC

## 2016-02-02 NOTE — Progress Notes (Signed)
Pre visit review using our clinic review tool, if applicable. No additional management support is needed unless otherwise documented below in the visit note. 

## 2016-02-02 NOTE — Assessment & Plan Note (Addendum)
Aciphex Get a GERD wedge/pillow

## 2016-02-02 NOTE — Assessment & Plan Note (Signed)
6/17 exacerbation Prednisone, Abx helped

## 2016-02-02 NOTE — Assessment & Plan Note (Signed)
Prednisone, Abx helped

## 2016-02-02 NOTE — Progress Notes (Signed)
Subjective:  Patient ID: Alex [name], male    DOB: 21-Jan-1956  Age: 60 y.o. MRN: [id number]  CC: No chief complaint on file.   HPI Alex [name] presents for a cough, GERD, hypogonadism, anxiety, HTN.  Outpatient Prescriptions Prior to Visit  Medication Sig Dispense Refill  . ALPHAGAN P 0.1 % SOLN Place 1 drop into both eyes 2 (two) times daily.     [name] aspirin EC 81 MG tablet Take 1 tablet by mouth daily.    [name] atorvastatin (LIPITOR) 40 MG tablet TAKE 1 TABLET (40 MG TOTAL) BY MOUTH DAILY. 90 tablet 3  . AXIRON 30 MG/ACT SOLN Place 2 Act onto the skin daily.    . Coenzyme Q10 (CO Q 10 PO) Take by mouth daily.    [name] dicyclomine (BENTYL) 20 MG tablet TAKE 1 TABLET 3 TIMES A DAY AS NEEDED  6  . Immune Globulin, Human, (HIZENTRA) 4 GM/20ML SOLN Inject into the skin once a week. wednesday    . LamoTRIgine 300 MG TB24 Take 1 tablet (300 mg total) by mouth daily. 90 tablet 0  . lidocaine-prilocaine (EMLA) cream as needed.    [name] LORazepam (ATIVAN) 0.5 MG tablet Take 1 tab daily as needed and 1 tab at bed time 45 tablet 2  . OMEGA-3 KRILL OIL PO Take by mouth daily.    . predniSONE (DELTASONE) 10 MG tablet Prednisone 10 mg: take 4 tabs a day x 3 days; then 3 tabs a day x 4 days; then 2 tabs a day x 4 days, then 1 tab a day x 6 days, then stop. Take pc. 38 tablet 1  . promethazine-codeine (PHENERGAN WITH CODEINE) 6.25-10 MG/5ML syrup Take 5 mLs by mouth every 4 (four) hours as needed. 300 mL 0  . RABEprazole (ACIPHEX) 20 MG tablet TAKE 1 TABLET BY MOUTH EVERY DAY--  takes in pm    . sucralfate (CARAFATE) 1 G tablet Take 1 g by mouth 4 (four) times daily.  3  . telmisartan (MICARDIS) 80 MG tablet Take 1 tablet (80 mg total) by mouth every morning. PLEASE CONTACT OFFICE FOR ADDITIONAL REFILLS 90 tablet 0  . zolpidem (AMBIEN) 10 MG tablet Take 1 tablet (10 mg total) by mouth at bedtime as needed. for sleep 30 tablet 0  . fluconazole (DIFLUCAN) 150 MG tablet Reported on 08/04/2015    .  levothyroxine (SYNTHROID) 150 MCG tablet Take 1 tablet (150 mcg total) by mouth daily. Brand name only please. 90 tablet 3  . amLODipine (NORVASC) 5 MG tablet Take 1 tablet (5 mg total) by mouth daily. (Patient not taking: Reported on 02/02/2016) 90 tablet 3  . fluticasone furoate-vilanterol (BREO ELLIPTA) 100-25 MCG/INH AEPB Inhale 1 puff into the lungs daily. (Patient not taking: Reported on 02/02/2016) 1 each 3   No facility-administered medications prior to visit.    ROS Review of Systems  Constitutional: Positive for fatigue. Negative for fever, appetite change and unexpected weight change.  HENT: Negative for congestion, nosebleeds, sneezing, sore throat and trouble swallowing.   Eyes: Positive for redness. Negative for itching and visual disturbance.  Respiratory: Positive for cough.   Cardiovascular: Negative for chest pain, palpitations and leg swelling.  Gastrointestinal: Negative for nausea, diarrhea, blood in stool and abdominal distention.  Genitourinary: Negative for frequency, hematuria and testicular pain.  Musculoskeletal: Positive for back pain. Negative for joint swelling, gait problem and neck pain.  Skin: Negative for rash.  Neurological: Negative for dizziness, tremors, speech difficulty and weakness.  Psychiatric/Behavioral: Negative for suicidal ideas, sleep disturbance, dysphoric mood and agitation. The patient is nervous/anxious.     Objective:  BP 130/80 mmHg  Pulse 92  Wt 176 lb (79.833 kg)  SpO2 97%  BP Readings from Last 3 Encounters:  02/02/16 130/80  01/04/16 144/90  12/19/15 116/74    Wt Readings from Last 3 Encounters:  02/02/16 176 lb (79.833 kg)  01/04/16 179 lb 3.2 oz (81.285 kg)  12/19/15 176 lb 12.8 oz (80.196 kg)    Physical Exam  Constitutional: He is oriented to person, place, and time. He appears well-developed. No distress.  NAD  HENT:  Mouth/Throat: Oropharynx is clear and moist. No oropharyngeal exudate.  Eyes: Pupils are equal,  round, and reactive to light.  Neck: Normal range of motion. No JVD present. No thyromegaly present.  Cardiovascular: Normal rate, regular rhythm, normal heart sounds and intact distal pulses.  Exam reveals no gallop and no friction rub.   No murmur heard. Pulmonary/Chest: Effort normal and breath sounds normal. No respiratory distress. He has no wheezes. He has no rales. He exhibits no tenderness.  Abdominal: Soft. Bowel sounds are normal. He exhibits no distension and no mass. There is no tenderness. There is no rebound and no guarding.  Musculoskeletal: Normal range of motion. He exhibits no edema or tenderness.  Lymphadenopathy:    He has no cervical adenopathy.  Neurological: He is alert and oriented to person, place, and time. He has normal reflexes. No cranial nerve deficit. He exhibits normal muscle tone. He displays a negative Romberg sign. Coordination and gait normal.  Skin: Skin is warm and dry. No rash noted.  Psychiatric: His behavior is normal. Judgment and thought content normal.  blepharitis B Stressed  Lab Results  Component Value Date   WBC 11.9* 08/01/2015   HGB 16.4 08/01/2015   HCT 51.0 08/01/2015   PLT 344.0 08/01/2015   GLUCOSE 105* 08/01/2015   CHOL 178 08/01/2015   TRIG 105.0 08/01/2015   HDL 70.80 08/01/2015   LDLDIRECT 134.7 03/05/2007   LDLCALC 86 08/01/2015   ALT 33 08/01/2015   AST 23 08/01/2015   NA 141 08/01/2015   K 4.5 08/01/2015   CL 102 08/01/2015   CREATININE 0.97 08/01/2015   BUN 21 08/01/2015   CO2 29 08/01/2015   TSH 4.14 08/01/2015   PSA 1.46 08/01/2015   HGBA1C 6.1 08/04/2012    Dg Chest 2 View  11/10/2015  CLINICAL DATA:  Cough and congestion for 6 weeks. Acute bronchitis with bronchospasm. EXAM: CHEST  2 VIEW COMPARISON:  03/31/2013 chest radiograph. FINDINGS: Surgical clips overlie the bilateral medial lower neck. Stable cardiomediastinal silhouette with normal heart size. No pneumothorax. No pleural effusion. Lungs appear clear,  with no acute consolidative airspace disease and no pulmonary edema. IMPRESSION: No active cardiopulmonary disease. Electronically Signed   By: Ilona Sorrel [name]   On: 11/10/2015 16:53    Assessment & Plan:   Diagnoses and all orders for this visit:  Bronchospasm with bronchitis, acute  Acute URI  Atherosclerosis of native coronary artery of native heart without angina pectoris  Hypogonadism male  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HCC)  Dyslipidemia  ESOPHAGEAL STRICTURE  Other orders -     fluconazole (DIFLUCAN) 150 MG tablet; Take 1 tablet (150 mg total) by mouth once. Reported on 08/04/2015 -     SYNTHROID 150 MCG tablet; Take 1 tablet (150 mcg total) by mouth daily. Brand name only please. Dispense as written 1   I have changed Mr.  Kertesz's levothyroxine to SYNTHROID. I have also changed his fluconazole. I am also having him maintain his Immune Globulin (Human), Coenzyme Q10 (CO Q 10 PO), OMEGA-3 KRILL OIL PO, RABEprazole, sucralfate, AXIRON, ALPHAGAN P, lidocaine-prilocaine, aspirin EC, atorvastatin, dicyclomine, telmisartan, fluticasone furoate-vilanterol, zolpidem, LORazepam, LamoTRIgine, amLODipine, promethazine-codeine, and predniSONE.  Meds ordered this encounter  Medications  . fluconazole (DIFLUCAN) 150 MG tablet    Sig: Take 1 tablet (150 mg total) by mouth once. Reported on 08/04/2015    Dispense:  4 tablet    Refill:  2  . SYNTHROID 150 MCG tablet    Sig: Take 1 tablet (150 mcg total) by mouth daily. Brand name only please. Dispense as written 1    Dispense:  90 tablet    Refill:  3     Follow-up: Return in about 6 months (around 08/03/2016) for Wellness Exam.  Walker Kehr, MD

## 2016-02-02 NOTE — Assessment & Plan Note (Signed)
No angina ASA, amlodipine, krill oil

## 2016-02-02 NOTE — Assessment & Plan Note (Signed)
IV IgG I'm not sure if he can have Zostavax

## 2016-02-02 NOTE — Assessment & Plan Note (Signed)
Dr Diona Fanti  Potential benefits of a long term sex steroid  use as well as potential risks  and complications were explained to the patient and were aknowledged.

## 2016-02-02 NOTE — Patient Instructions (Signed)
Get a GERD wedge/pillow

## 2016-02-04 ENCOUNTER — Other Ambulatory Visit: Payer: Self-pay | Admitting: Cardiology

## 2016-02-05 NOTE — Telephone Encounter (Signed)
Rx(s) sent to pharmacy electronically.  

## 2016-02-14 ENCOUNTER — Ambulatory Visit (INDEPENDENT_AMBULATORY_CARE_PROVIDER_SITE_OTHER): Payer: Managed Care, Other (non HMO) | Admitting: Licensed Clinical Social Worker

## 2016-02-14 DIAGNOSIS — F3162 Bipolar disorder, current episode mixed, moderate: Secondary | ICD-10-CM

## 2016-02-14 NOTE — Progress Notes (Signed)
   THERAPIST PROGRESS NOTE  Session Time: 2:10-3:00  Participation Level: Active  Behavioral Response: CasualAlertAnxious  Type of Therapy: Individual Therapy  Treatment Goals addressed: Coping  Interventions: CBT and Supportive  Summary: Alex [name] is a 60 y.o. male who presented with anxiety and depression. Today was initial session with new therapist. Pt gave a hx of his anxiety and depression, what triggers it and his support system. Pt talked about his children which are a major stressor to him. Pt appeared frustrated in discussing his children. He also discussed animosity that he feels towards his wife. Pt also discussed resentments that he has about his children. Discussed boundaries and codependency with pt.   Suicidal/Homicidal: No  Therapist Response: Assessed pt's functioning today to use as a baseline. Assisted pt with boundaries, stressors, resentments and codependency.   Plan: Continue with CBT and return again in 2 weeks.  Diagnosis: Axis I: Bipolar, mixed    Axis II: No DX    MACKENZIE,LISBETH S, Licensed Cli [id number]

## 2016-03-07 ENCOUNTER — Ambulatory Visit (INDEPENDENT_AMBULATORY_CARE_PROVIDER_SITE_OTHER): Payer: 59 | Admitting: Licensed Clinical Social Worker

## 2016-03-07 DIAGNOSIS — F3162 Bipolar disorder, current episode mixed, moderate: Secondary | ICD-10-CM | POA: Diagnosis not present

## 2016-03-11 ENCOUNTER — Encounter: Payer: Self-pay | Admitting: Pulmonary Disease

## 2016-03-11 ENCOUNTER — Ambulatory Visit (INDEPENDENT_AMBULATORY_CARE_PROVIDER_SITE_OTHER): Payer: Managed Care, Other (non HMO) | Admitting: Pulmonary Disease

## 2016-03-11 VITALS — BP 132/74 | HR 87 | Ht 73.0 in | Wt 180.6 lb

## 2016-03-11 DIAGNOSIS — R05 Cough: Secondary | ICD-10-CM

## 2016-03-11 DIAGNOSIS — J42 Unspecified chronic bronchitis: Secondary | ICD-10-CM

## 2016-03-11 DIAGNOSIS — D839 Common variable immunodeficiency, unspecified: Secondary | ICD-10-CM

## 2016-03-11 DIAGNOSIS — R059 Cough, unspecified: Secondary | ICD-10-CM

## 2016-03-11 MED ORDER — BENZONATATE 100 MG PO CAPS: 100.0000 mg | ORAL_CAPSULE | Freq: Three times a day (TID) | ORAL | 1 refills | Status: DC | PRN

## 2016-03-11 NOTE — Patient Instructions (Addendum)
   Continue using Mucinex/Guaifenesin 600mg  twice daily with plenty of liquids.  Continue using your albuterol inhaler as needed for your cough.  We will review the results of your tests at your next appointment or sooner by phone if needed.  Your sputum culture sample is best if it is first thing in the morning and should be a deep specimen. You should not refrigerate it. It should be delivered to the lab within 4 hours.  Call/e-mail me if you have any further questions or concerns before your next appointment.  TESTS ORDERED 1. Sputum culture for AFB, fungus, and bacteria 2. Full pulmonary function testing before next appointment 3. 6 minute walk test on room air at next appointment 4. High-resolution CT chest without contrast 5. Esophagogram

## 2016-03-11 NOTE — Progress Notes (Signed)
Subjective:    Patient ID: Alex [name], male    DOB: 05/29/1956, 60 y.o.   MRN: [id number]  HPI She has a known history of recurrent bronchitis, thyroid cancer, and squamous cell carcinoma of the head and neck as well as common variable immunodeficiency. Patient presented to the emergency department on 7/23 at outside hospital with 4 days of dyspnea. Reportedly the patient had 3 episodes of "bronchitis" since February. At that time patient was reporting wheezing and a cough productive of a light yellow sputum. He also had rhinorrhea and nasal congestion. At that time patient denies any fever, chills, nausea, or vomiting. Patient was given nebulizer treatment along with Solu-Medrol with resolution of symptoms. He was also prescribed an albuterol inhaler to use on an as-needed basis. He reports he has only rarely used it but he has been using it at night before going to bed due to chest tightness with "whistling" and "rattling". He reports his chest congestion does "build up" at night and wakes up with coughing. He feels the inhaler does help to expectorate the mucus. Previously started on Breo 100-25 but was recommended to stop the [facility] by his allergist. Does report dyspnea on exertion. He underwent some in-office spirometry that was abnormal and prompted his referral to [place]. He doesn't feel the Breo helped his symptoms significantly. Denies any breathing problems or recurrent allergies as a child. He reports no relief in his symptoms with long vacations. No worsening in his dyspnea with exposure to perfumes or strong odors. He does report if he go from a warm to cool environment he has more troubles breathing. He has near syncope with coughing spells. Denies any significant sinus congestion or drainage at this time. He reports he continues to cough up a "light, yellow" mucus that can be thick at times. No hemoptysis. He does have reflux. Reports he does take Aciphex but denies any dyspepsia or morning  brash water taste. Denies any chest pain or pressure. He does have pain in his chest with coughing.   Review of Systems No rashes or abnormal bruising. Reports pain & swelling in his left ankle. Denies any stiffness in his hands. A pertinent 14 point review of systems is negative except as per the history of presenting illness.  Allergies  Allergen Reactions  . Percocet [Oxycodone-Acetaminophen] Itching    Can take generic.  States only has a problem with percocet brand    Current Outpatient Prescriptions on File Prior to Visit  Medication Sig Dispense Refill  . ALPHAGAN P 0.1 % SOLN Place 1 drop into both eyes 2 (two) times daily.     [name] aspirin EC 81 MG tablet Take 1 tablet by mouth daily.    [name] atorvastatin (LIPITOR) 40 MG tablet TAKE 1 TABLET (40 MG TOTAL) BY MOUTH DAILY. 90 tablet 3  . AXIRON 30 MG/ACT SOLN Place 2 Act onto the skin daily.    . Coenzyme Q10 (CO Q 10 PO) Take by mouth daily.    [name] dicyclomine (BENTYL) 20 MG tablet TAKE 1 TABLET 3 TIMES A DAY AS NEEDED  6  . Immune Globulin, Human, (HIZENTRA) 4 GM/20ML SOLN Inject into the skin once a week. wednesday    . LamoTRIgine 300 MG TB24 Take 1 tablet (300 mg total) by mouth daily. 90 tablet 0  . lidocaine-prilocaine (EMLA) cream as needed.    [name] LORazepam (ATIVAN) 0.5 MG tablet Take 1 tab daily as needed and 1 tab at bed time 45 tablet 2  .  OMEGA-3 KRILL OIL PO Take by mouth daily.    . promethazine-codeine (PHENERGAN WITH CODEINE) 6.25-10 MG/5ML syrup Take 5 mLs by mouth every 4 (four) hours as needed. 300 mL 0  . RABEprazole (ACIPHEX) 20 MG tablet TAKE 1 TABLET BY MOUTH EVERY DAY--  takes in pm    . sucralfate (CARAFATE) 1 G tablet Take 1 g by mouth as needed.   3  . SYNTHROID 150 MCG tablet Take 1 tablet (150 mcg total) by mouth daily. Brand name only please. Dispense as written 1 90 tablet 3  . telmisartan (MICARDIS) 80 MG tablet Take 1 tablet (80 mg total) by mouth daily. 90 tablet 3  . zolpidem (AMBIEN) 10 MG tablet Take 1  tablet (10 mg total) by mouth at bedtime as needed. for sleep 30 tablet 0  . fluconazole (DIFLUCAN) 150 MG tablet Take 1 tablet (150 mg total) by mouth once. Reported on 08/04/2015 (Patient not taking: Reported on 03/11/2016) 4 tablet 2  . fluticasone furoate-vilanterol (BREO ELLIPTA) 100-25 MCG/INH AEPB Inhale 1 puff into the lungs daily. (Patient not taking: Reported on 02/02/2016) 1 each 3   No current facility-administered medications on file prior to visit.     Past Medical History:  Diagnosis Date  . Anxiety   . At risk for sleep apnea    STOP-BANG= 5    SENT TO PCP 04-22-2014  . Bipolar I disorder (HCC)   . Bleeding ulcer 06/2014  . BPH (benign prostatic hypertrophy) with urinary obstruction   . Chronic back pain   . Complication of anesthesia POST URINARY RETENTION---  2006 SHOULDER SURGERY MARKED BRADYCARDIA VAGAL RESPONSE NO ISSUE W/ SURGERY AFTER THIS ONE  . Coronary atherosclerosis CARDIOLOGIST- DR CRENSHAW--  LAST VISIT 01-05-2012 IN EPIC   NON-OBSTRUCTIVE MILD DISEASE  . 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HCC)   . Depression   . Epicondylitis    right elbow  . GERD (gastroesophageal reflux disease)   . Glaucoma BOTH EYES -- NO DROPS   . History of chronic prostatitis   . 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 History of thyroid cancer PRIMARY (NO METS FROM ORBITAL CANCER)--   IN REMISSION   S/P TOTAL THYROIDECTOMY  , CHEMORADIATION  (ONCOLOGIST -- DR Arlice Colt)  . Hyperlipidemia   . Hypertension   . Immune deficiency disorder (HCC)    COMMON VARIABLE  . Nocturia   . OA (osteoarthritis)   . Positional vertigo   . Renal calculi LEFT KIDNEY-- NON-OBSTRUCTIVE  . Ulnar nerve compression    right elbow  . Urinary hesitancy     Past Surgical History:  Procedure Laterality Date  . CARDIAC  CATHETERIZATION  01-16-2006  DR Maisie Fus WALL   MILD CORONARY ATHEROSCLEROSIS/ MID TO DISTAL LAD 40% STENOSIS/ LVF 50-55%  . KNEE ARTHROSCOPY  05/01/2012   Procedure: ARTHROSCOPY KNEE;  Surgeon: Javier Docker, MD;  Location: [facility];  Service: Orthopedics;  Laterality: Left;  debridement and removal of loose body  . LEFT ANKLE ARTHROSCOPY W/ DEBRIDEMENT  05-12-2007  . LEFT HYDROCELECTOMY  03-29-2005   AND REPAIR LEFT INGUINAL HERNIA W/ MESH  . MOHS SURGERY  2002   RIGHT ORBITAL CANCER  . NASAL ENDOSCOPY  08-07-2005   RIGHT EPISTAXIS  / POST SEPTOPLASTY  (HX  RIGHT ORBITAL CA & S/P RADIATION/ NECROSIS ANTERIOR END OF BOTH INFERIOR TURBINATES)  . occuloplastic surgery  2002  . PARS PLANA VITRECTOMY  11-06-2004   RIGHT EYE RADIATION RETINOPATHY W/ HEMORRHAGE  . REPAIR UNDESENDED RIGHT TESTICLE / RIGHT INGUINAL HERNIA  AGE 44  . RIGHT ANKLE ARTHROSCOPY W/ EXTENSIVE DEBRIDEMENT  04-05-2008   . RIGHT SUPRAOMOHYOID NECK DISSECTION   03-08-2003   ZONES 1,2,3;   SUBMANDIBULAR MASS / METASTATIC SQUAMOUS CELL CARCINOMA RIGHT NECK  . SEPTOPLASTY  NOV 2006  . SHOULDER ARTHROSCOPY W/ SUBACROMIAL DECOMPRESSION AND DISTAL CLAVICLE EXCISION  10-09-2008   AND DEBRIDEMENT OF RIGHT SHOULDER [facility] JOINT ARTHRITIS  . SPINE SURGERY    . TOTAL THYROIDECTOMY  11-03-2001   PAPILLARY THYROID CARCINOMA  . TRANSTHORACIC ECHOCARDIOGRAM  12/ 2012   grade I diastolic dysfunction/ ef 0000000  . ULNAR NERVE TRANSPOSITION Right 04/28/2014   Procedure: RIGHT ELBOW ULNA NERVE RELEASE TRANSPOSTION AND MEDIAL EPICONDYLAR DEBRIDEMENT AND REPAIR;  Surgeon: Linna Hoff, MD;  Location: [facility];  Service: Orthopedics;  Laterality: Right;    Family History  Problem Relation Age of Onset  . Depression Sister   . Rectal cancer Sister   . Lung cancer Brother   . Kidney disease Mother   . Depression Mother   . Stroke Father   . COPD Father   . Hypertension Father   . Prostate  cancer Father   . Depression Daughter   . Drug abuse Daughter   . Psychiatric Illness Son     Social History   Social History  . Marital status: Married    Spouse name: N/A  . Number of children: N/A  . Years of education: N/A   Social History Main Topics  . Smoking status: Never Smoker  . Smokeless tobacco: Never Used  . Alcohol use 0.0 oz/week     Comment: 1-2 glasses per month  . Drug use: No  . Sexual activity: Not Asked   Other Topics Concern  . None   Social History Narrative   Originally from [place]. Moved to [place] in 1984. 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 No international travel other than [place]. Has a dog at home, poodle mix. His daughter who lives with him now has a dog. No mold exposure. No bird exposure. No hot tub exposure. Enjoys gardening.       Objective:   Physical Exam BP 132/74 (BP Location: Left Arm, Cuff Size: Normal)   Pulse 87   Ht 6\' 1"  (1.854 m)   Wt 180 lb 9.6 oz (81.9 kg)   SpO2 97%   BMI 23.83 kg/m  General:  Awake. Alert. No acute distress. Integument:  Warm & dry. No rash on exposed skin. No bruising. Lymphatics:  No appreciated cervical or supraclavicular lymphadenoapthy. HEENT:  Moist mucus membranes. No oral ulcers. No scleral icterus.  Cardiovascular:  Regular rate. No edema. No appreciable JVD.  Pulmonary:  Good aeration & clear to auscultation bilaterally. Symmetric chest wall expansion. No accessory muscle use. Coughing intermittently. Abdomen: Soft. Normal bowel sounds. Nondistended. Grossly nontender. Musculoskeletal:  Normal bulk and tone. Hand grip strength 5/5 bilaterally. No joint deformity or effusion appreciated. Neurological:  CN 2-12 grossly in tact. No meningismus. Moving all 4 extremities equally. Symmetric brachioradialis deep tendon reflexes. Psychiatric:  Mood and affect  congruent. Speech normal rhythm,  rate & tone.   IMAGING PORT CXR 03/03/16 (per radiologist): Heart normal in size. Lungs clear. No acute disease.  CXR PA/LAT 11/10/15 (personally reviewed by me): No focal opacity or mass appreciated. No pleural effusion. Heart normal in size & mediastinum normal in contour.  LABS 03/03/16 CBC: 10.1/15.5/46.0/288 BMP: 140/4.1/107/25/24/1.1/105/8.7  3/10/8 RF:  <20.0    Assessment & Plan:  60 year old male with chronic bronchitis as well as common variable immunodeficiency. Patient presenting with chronic and ongoing productive cough. Denies any significant reflux or sinus symptoms that would suggest a cause for his cough. I am doubtful that he has true underlying COPD but this must be considered. I do believe he is at high risk for bronchiectasis with his common variable immunodeficiency. However, we must investigate other potential causes in addition atypical infectious processes. I instructed the patient contact my office if he had any further questions or concerns before his next appointment.  1. Chronic Bronchitis: Checking full pulmonary function testing before next appointment in addition to 6 minute walk test on room air at next appointment. 2. CVID: Following with allergist for immunoglobulin replacement. Checking high-resolution CT chest without contrast. 3. Cough: Checking sputum culture for AFB, fungus, and bacteria. Also checking esophagogram. 4. GERD: Controlled with AcipHex. Checking esophagogram for occult reflux. 5. Follow-up: Patient to return to clinic in 4-6 weeks or sooner if needed.  Sonia Baller Ashok Cordia, [name] [facility] Pager:  [phone] After 3pm or if no response, call [phone] 9:41 AM 03/11/16

## 2016-03-12 ENCOUNTER — Other Ambulatory Visit: Payer: Managed Care, Other (non HMO)

## 2016-03-12 DIAGNOSIS — R05 Cough: Secondary | ICD-10-CM

## 2016-03-12 DIAGNOSIS — R059 Cough, unspecified: Secondary | ICD-10-CM

## 2016-03-12 NOTE — Progress Notes (Signed)
   THERAPIST PROGRESS NOTE  Session Time: 2;10-3:00 pm  Participation Level: Active  Behavioral Response: CasualAlert/Anxious  Type of Therapy: Individual Therapy  Treatment Goals addressed: Coping  Interventions: CBT, Supportive and Reframing  Summary: Alex [name] is a 60 y.o. male who presents with depression and anxietiy. Pt expresses frustration and resentment towards his children who live in the home. Asked pt what his life would look like if all his children were no longer in the home. Pt seemed hopeful for a moment and then seemed resolute to the reality of his life. Worked with pt on how to use good communication skills with children, asking for what he needs from them. Suicidal/Homicidal: without intent/plan/No  Therapist Response: Assessed pt's current functioning and reviewed progress. Assisted pt processing codependency, boundaries with family and how to deal with stressors that manifest from the family issues.  Plan: Return again in 2 weeks.  Diagnosis: Axis I: Bipolar, depressed    Axis II: No DX    Kalese Ensz S, LCAS-A 03/12/2016

## 2016-03-13 ENCOUNTER — Other Ambulatory Visit (HOSPITAL_COMMUNITY): Payer: Self-pay | Admitting: Psychiatry

## 2016-03-13 DIAGNOSIS — F3162 Bipolar disorder, current episode mixed, moderate: Secondary | ICD-10-CM

## 2016-03-14 ENCOUNTER — Other Ambulatory Visit (HOSPITAL_COMMUNITY): Payer: Self-pay

## 2016-03-14 DIAGNOSIS — F3162 Bipolar disorder, current episode mixed, moderate: Secondary | ICD-10-CM

## 2016-03-14 MED ORDER — ZOLPIDEM TARTRATE 10 MG PO TABS: 10.0000 mg | ORAL_TABLET | Freq: Every evening | ORAL | 0 refills | Status: DC | PRN

## 2016-03-15 ENCOUNTER — Institutional Professional Consult (permissible substitution): Payer: Self-pay | Admitting: Pulmonary Disease

## 2016-03-15 ENCOUNTER — Other Ambulatory Visit (HOSPITAL_COMMUNITY): Payer: Self-pay | Admitting: Psychiatry

## 2016-03-15 DIAGNOSIS — F3162 Bipolar disorder, current episode mixed, moderate: Secondary | ICD-10-CM

## 2016-03-18 ENCOUNTER — Other Ambulatory Visit: Payer: Self-pay

## 2016-03-18 ENCOUNTER — Ambulatory Visit (HOSPITAL_COMMUNITY): Payer: Self-pay | Admitting: Psychiatry

## 2016-03-19 ENCOUNTER — Ambulatory Visit (INDEPENDENT_AMBULATORY_CARE_PROVIDER_SITE_OTHER): Payer: 59 | Admitting: Licensed Clinical Social Worker

## 2016-03-19 ENCOUNTER — Other Ambulatory Visit (HOSPITAL_COMMUNITY): Payer: Self-pay | Admitting: Psychiatry

## 2016-03-19 ENCOUNTER — Ambulatory Visit
Admission: RE | Admit: 2016-03-19 | Discharge: 2016-03-19 | Disposition: A | Discharge status: Home / Self Care | Payer: Managed Care, Other (non HMO) | Source: Ambulatory Visit | Attending: Pulmonary Disease | Admitting: Pulmonary Disease

## 2016-03-19 DIAGNOSIS — F319 Bipolar disorder, unspecified: Secondary | ICD-10-CM

## 2016-03-19 DIAGNOSIS — R059 Cough, unspecified: Secondary | ICD-10-CM

## 2016-03-19 DIAGNOSIS — R05 Cough: Secondary | ICD-10-CM

## 2016-03-19 DIAGNOSIS — F3162 Bipolar disorder, current episode mixed, moderate: Secondary | ICD-10-CM | POA: Diagnosis not present

## 2016-03-19 LAB — FUNGUS CULTURE W SMEAR

## 2016-03-20 ENCOUNTER — Encounter (HOSPITAL_COMMUNITY): Payer: Self-pay | Admitting: Licensed Clinical Social Worker

## 2016-03-20 ENCOUNTER — Telehealth: Payer: Self-pay | Admitting: Cardiology

## 2016-03-20 NOTE — Telephone Encounter (Signed)
Left detailed message for pt regarding testing to be repeated in one year.

## 2016-03-20 NOTE — Telephone Encounter (Signed)
Returned patient call-pt reports he had CT chest done yesterday for ongoing breathing issues (being followed by pulmonologist MD Ashok Cordia) and CT scan revealed:   "2. Aortic atherosclerosis. Ascending thoracic aortic 4.1 cm Aneurysm."  Pt wanted to let MD Crenshaw know (no documented hx of) and see if he recommends any further imaging/testing.  CT scan available in Epic.

## 2016-03-20 NOTE — Telephone Encounter (Signed)
Fu cta of thoracic aorta one year [name]

## 2016-03-20 NOTE — Progress Notes (Signed)
   THERAPIST PROGRESS NOTE  Session Time: 3:10-4:00pm  Participation Level: Active  Behavioral Response: CasualAlertAnxious  Type of Therapy: Individual Therapy  Treatment Goals addressed: Coping  Interventions: CBT  Summary: Alex [name] is a 60 y.o. male who presents with depression and anxiety. Pt reports his stressors are still his family. Discussed with him the boundaries he wants to set with his family, that we discussed at last session. Pt does not feel support from his wife to put the boundaries in place. Discussed with pt the lack of joy in his life and his own personal self-care.  Suicidal/Homicidal: Nowithout intent/plan  Therapist Response: Assessed pt's current functioning and reviewed progress. Assisted pt in processing continued issues with family and processing for management of stressors.  Plan: Return again in 2 weeks using CBT.  Diagnosis: Axis I: Bipolar 1 disorder, mixed, moderate    Axis II: No DX    MACKENZIE,LISBETH S, LCAS-A 03/20/2016

## 2016-03-20 NOTE — Telephone Encounter (Signed)
Alex [name] is calling to ask if Dr. Stanford Breed can take a look at the CT done on yesterday , because it says he has a Enlarges  Descending Aorta . Please call   Thanks

## 2016-03-21 ENCOUNTER — Ambulatory Visit: Payer: Self-pay | Admitting: Family Medicine

## 2016-03-21 ENCOUNTER — Ambulatory Visit (INDEPENDENT_AMBULATORY_CARE_PROVIDER_SITE_OTHER): Payer: Managed Care, Other (non HMO) | Admitting: Family Medicine

## 2016-03-21 ENCOUNTER — Encounter: Payer: Self-pay | Admitting: Family Medicine

## 2016-03-21 VITALS — BP 124/80 | HR 71 | Temp 98.2°F | Resp 12 | Ht 73.0 in | Wt 180.5 lb

## 2016-03-21 DIAGNOSIS — J0100 Acute maxillary sinusitis, unspecified: Secondary | ICD-10-CM

## 2016-03-21 MED ORDER — AMOXICILLIN-POT CLAVULANATE ER 1000-62.5 MG PO TB12: 1.0000 | ORAL_TABLET | Freq: Two times a day (BID) | ORAL | 0 refills | Status: AC

## 2016-03-21 MED ORDER — AMOXICILLIN-POT CLAVULANATE 875-125 MG PO TABS: 1.0000 | ORAL_TABLET | Freq: Two times a day (BID) | ORAL | 0 refills | Status: DC

## 2016-03-21 NOTE — Patient Instructions (Addendum)
  [name] I have seen you today for an acute visit because your primary care provider was not available. Monitor for signs of worsening symptoms and seek immediate medical attention if any concerning/warning symptom as we discussed. If symptoms are not resolved in 1-2 weeks you should schedule a follow up appointment with your doctor, before if symptoms get worse.      A few things to remember from today's visit:   Acute maxillary sinusitis, recurrence not specified - Plan: amoxicillin-clavulanate (AUGMENTIN) 875-125 MG tablet  He could be a viral. Continue Mucinex, nasal saline drops recommended, and steam inhalations. Since you already started on Augmentin, prescription was sent to complete 10 days.  Please be sure medication list is accurate. If a new problem present, please set up appointment sooner than planned today.

## 2016-03-21 NOTE — Progress Notes (Signed)
HPI:   [name] is a 60 y.o.male here today complaining of 3-4 days of respiratory symptoms.  He is having right retroauricular pain and right facial pain. According to patient, he was evaluated by his eye care provider this morning because recent injury closed to right eye; and it was suggested that pain might be due to a sinus infection. He received a prescription for eyedrops. He would like a prescription for Augmentin XR, which he has taken before for similar problem and he feels like it is better tolerated.  He has Hx of glaucoma, he denies visual changes.  + Non productive cough, which he reports as chronic because history of chronic bronchitis. + Nasal congestion, rhinorrhea, and bloody mucus from right nostril this morning.  He has not noted fever,chills, odynophagia, chest pain, dyspnea, or wheezing.  He was in [name] recently. No sick contact. No known insect bite.  No Hx of allergies but he is reporting Hx of immunodeficiency disorder, he gives himself  Sibley injections of Ig periodically. He follows with immunologists. Also Hx of restrictive lung disease and referred to pulmonologists.  He has not tried OTC treatment.     Review of Systems  Constitutional: Negative for activity change, appetite change, chills, fatigue and fever.  HENT: Positive for congestion, nosebleeds, postnasal drip, rhinorrhea, sinus pressure and voice change. Negative for ear pain, mouth sores, sneezing, sore throat and trouble swallowing.   Eyes: Positive for pain (seen by eye care provider today.) and visual disturbance (no changes). Negative for photophobia, discharge and redness.  Respiratory: Positive for cough (Chronic). Negative for chest tightness, shortness of breath and wheezing.   Gastrointestinal: Negative for abdominal pain, diarrhea, nausea and vomiting.  Musculoskeletal: Negative for myalgias and neck pain.  Skin: Negative for color change and rash.  Neurological:  Negative for syncope, weakness and headaches.  Psychiatric/Behavioral: Negative for confusion. The patient is nervous/anxious.       Current Outpatient Prescriptions on File Prior to Visit  Medication Sig Dispense Refill  . albuterol (PROVENTIL HFA;VENTOLIN HFA) 108 (90 Base) MCG/ACT inhaler Inhale 2 puffs into the lungs every 6 (six) hours as needed for wheezing or shortness of breath.    . ALPHAGAN P 0.1 % SOLN Place 1 drop into both eyes 2 (two) times daily.     [name] aspirin EC 81 MG tablet Take 1 tablet by mouth daily.    [name] atorvastatin (LIPITOR) 40 MG tablet TAKE 1 TABLET (40 MG TOTAL) BY MOUTH DAILY. 90 tablet 3  . AXIRON 30 MG/ACT SOLN Place 2 Act onto the skin daily.    . benzonatate (TESSALON) 100 MG capsule Take 1-2 capsules (100-200 mg total) by mouth 3 (three) times daily as needed for cough. 90 capsule 1  . Coenzyme Q10 (CO Q 10 PO) Take by mouth daily.    [name] dicyclomine (BENTYL) 20 MG tablet TAKE 1 TABLET 3 TIMES A DAY AS NEEDED  6  . fluconazole (DIFLUCAN) 150 MG tablet Take 1 tablet (150 mg total) by mouth once. Reported on 08/04/2015 4 tablet 2  . fluticasone furoate-vilanterol (BREO ELLIPTA) 100-25 MCG/INH AEPB Inhale 1 puff into the lungs daily. 1 each 3  . Immune Globulin, Human, (HIZENTRA) 4 GM/20ML SOLN Inject into the skin once a week. wednesday    . LamoTRIgine 300 MG TB24 TAKE 1 TABLET (300 MG TOTAL) BY MOUTH DAILY. 90 tablet 0  . lidocaine-prilocaine (EMLA) cream as needed.    [name] LORazepam (ATIVAN) 0.5 MG  tablet Take 1 tab daily as needed and 1 tab at bed time 45 tablet 2  . OMEGA-3 KRILL OIL PO Take by mouth daily.    . promethazine-codeine (PHENERGAN WITH CODEINE) 6.25-10 MG/5ML syrup Take 5 mLs by mouth every 4 (four) hours as needed. 300 mL 0  . RABEprazole (ACIPHEX) 20 MG tablet TAKE 1 TABLET BY MOUTH EVERY DAY--  takes in pm    . sucralfate (CARAFATE) 1 G tablet Take 1 g by mouth as needed.   3  . SYNTHROID 150 MCG tablet Take 1 tablet (150 mcg total) by mouth  daily. Brand name only please. Dispense as written 1 90 tablet 3  . telmisartan (MICARDIS) 80 MG tablet Take 1 tablet (80 mg total) by mouth daily. 90 tablet 3  . zolpidem (AMBIEN) 10 MG tablet Take 1 tablet (10 mg total) by mouth at bedtime as needed. for sleep 30 tablet 0   No current facility-administered medications on file prior to visit.      Past Medical History:  Diagnosis Date  . Anxiety   . At risk for sleep apnea    STOP-BANG= 5    SENT TO PCP 04-22-2014  . Bipolar I disorder ([facility])   . Bleeding ulcer 06/2014  . BPH (benign prostatic hypertrophy) with urinary obstruction   . Chronic back pain   . Complication of anesthesia POST URINARY RETENTION---  2006 SHOULDER SURGERY MARKED BRADYCARDIA VAGAL RESPONSE NO ISSUE W/ SURGERY AFTER THIS ONE  . Coronary atherosclerosis CARDIOLOGIST- DR CRENSHAW--  LAST VISIT 01-05-2012 IN [facility]   NON-OBSTRUCTIVE MILD DISEASE  . 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 Depression   . Epicondylitis    right elbow  . GERD (gastroesophageal reflux disease)   . Glaucoma BOTH EYES -- NO DROPS   . History of chronic prostatitis   . 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 History of thyroid cancer PRIMARY (NO METS FROM ORBITAL CANCER)--   IN REMISSION   S/P TOTAL THYROIDECTOMY  , CHEMORADIATION  (ONCOLOGIST -- DR Griffith Citron)  . Hyperlipidemia   . Hypertension   . Immune deficiency disorder ([facility])    COMMON VARIABLE  . Nocturia   . OA (osteoarthritis)   . Positional vertigo   . Renal calculi LEFT KIDNEY-- NON-OBSTRUCTIVE  . Ulnar nerve compression    right elbow  . Urinary hesitancy    Allergies  Allergen Reactions  . Percocet [Oxycodone-Acetaminophen] Itching    Can take generic.  States only has a problem with percocet brand    Social History   Social History  .  Marital status: Married    Spouse name: N/A  . Number of children: N/A  . Years of education: N/A   Social History Main Topics  . Smoking status: Never Smoker  . Smokeless tobacco: Never Used  . Alcohol use 0.0 oz/week     Comment: 1-2 glasses per month  . Drug use: No  . Sexual activity: Not Asked   Other Topics Concern  . None   Social History Narrative   Originally from [place]. Moved to [place] in 1984. 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 without  a mask. No international travel other than [place]. Has a dog at home, poodle mix. His daughter who lives with him now has a dog. No mold exposure. No bird exposure. No hot tub exposure. Enjoys gardening.     Vitals:   03/21/16 1513  BP: 124/80  Pulse: 71  Resp: 12  Temp: 98.2 F (36.8 C)   Body mass index is 23.81 kg/m.   O2 sat at RA 98%.   Physical Exam  Nursing note and vitals reviewed. Constitutional: He is oriented to person, place, and time. He appears well-developed and well-nourished. He does not appear ill. No distress.  HENT:  Head: Atraumatic.  Right Ear: Tympanic membrane, external ear and ear canal normal.  Left Ear: Tympanic membrane, external ear and ear canal normal.  Nose: Rhinorrhea present. No nasal septal hematoma. No epistaxis. Right sinus exhibits maxillary sinus tenderness and frontal sinus tenderness. Left sinus exhibits no maxillary sinus tenderness and no frontal sinus tenderness.  Mouth/Throat: Oropharynx is clear and moist and mucous membranes are normal.  Mild dysphonia. Post nasal drainage.  Eyes: Conjunctivae and EOM are normal. Pupils are unequal (R pupil dilated,fixed).  Cardiovascular: Normal rate and regular rhythm.   No murmur heard. Respiratory: Effort normal and breath sounds normal. No respiratory distress.  Lymphadenopathy:    He has no cervical adenopathy.    Neurological: He is alert and oriented to person, place, and time. Coordination normal.  Skin: Skin is warm. No erythema.  Psychiatric: His speech is normal. His mood appears anxious.      ASSESSMENT AND PLAN:     Alex [name] was seen today for sinus problem.  Diagnoses and all orders for this visit:  Acute maxillary sinusitis, recurrence not specified   Explained that symptoms could be viral, in which case abx will not help. We discussed some side effects of frequent use of abx. Because reported history of immunodeficiency disorder, recommended completing 10 days of Augmentin treatment. Instructed about warning signs. Because history of glaucoma, intranasal steroids not recommended. Follow up with PCP as needed.  -     amoxicillin-clavulanate (AUGMENTIN XR) 1000-62.5 MG 12 hr tablet; Take 1 tablet by mouth 2 (two) times daily. To complete 10 days, already took 1 day.        -He was advised to return or notify a doctor immediately if symptoms worsen or persist or new concerns arise.       Alex Hickmon G. [place], MD  [facility]. [place] office.

## 2016-03-21 NOTE — Progress Notes (Signed)
Pre visit review using our clinic review tool, if applicable. No additional management support is needed unless otherwise documented below in the visit note. 

## 2016-03-22 ENCOUNTER — Telehealth: Payer: Self-pay | Admitting: Pulmonary Disease

## 2016-03-22 NOTE — Telephone Encounter (Signed)
   Pt's sputum sample is not sufficient for all tests per Cassie in lab.  Pt needs to bring another sample in.  lmtcb X1 for pt to make aware.

## 2016-03-25 NOTE — Telephone Encounter (Signed)
[phone], pt cb

## 2016-03-25 NOTE — Telephone Encounter (Signed)
Spoke with pt. He is aware of the below information. States that he is in [place] right now and will come by when he gets back in town. Nothing further was needed at this time.

## 2016-03-25 NOTE — Telephone Encounter (Signed)
lmomtcb x 2  For the pt.

## 2016-03-27 ENCOUNTER — Telehealth (HOSPITAL_COMMUNITY): Payer: Self-pay | Admitting: Psychiatry

## 2016-03-27 DIAGNOSIS — F319 Bipolar disorder, unspecified: Secondary | ICD-10-CM

## 2016-03-28 ENCOUNTER — Other Ambulatory Visit (HOSPITAL_COMMUNITY): Payer: Self-pay

## 2016-03-28 DIAGNOSIS — F319 Bipolar disorder, unspecified: Secondary | ICD-10-CM

## 2016-03-28 MED ORDER — LORAZEPAM 0.5 MG PO TABS: ORAL_TABLET | ORAL | 1 refills | Status: DC

## 2016-03-29 ENCOUNTER — Telehealth: Payer: Self-pay | Admitting: Pulmonary Disease

## 2016-03-29 DIAGNOSIS — R05 Cough: Secondary | ICD-10-CM

## 2016-03-29 DIAGNOSIS — R059 Cough, unspecified: Secondary | ICD-10-CM

## 2016-03-29 DIAGNOSIS — J42 Unspecified chronic bronchitis: Secondary | ICD-10-CM

## 2016-03-29 NOTE — Telephone Encounter (Signed)
JN please advise if ok to go ahead and order the new sputum culture test. Pt has a cup and stated that he will bring in the sample on Monday but wanted to make sure that the order is in. Please advise. thanks

## 2016-03-29 NOTE — Telephone Encounter (Signed)
Yes that's fine. Sputum culture for AFB, Fungus, & Bacteria.

## 2016-03-29 NOTE — Telephone Encounter (Signed)
Medical therapy [name]

## 2016-03-29 NOTE — Telephone Encounter (Signed)
Patient walked in office to discuss recent chest ct done 03/19/16.Advised Dr.Crenshaw recommended to have repeated in 1 year to follow up ascending thoracic aneurysm. Stated he was concerned ct said he had right coronary atherosclerosis.Stated he eats a heart healthy diet and his BMI is normal range.Patient reassured.Advised to continue heart healthy diet and continue taking atorvastatin,krill oil.Advised I will send message to [facility] and let him know you are concerned about this.

## 2016-04-01 NOTE — Telephone Encounter (Signed)
Called and spoke with pt and he is aware of order put in the computer for the sputum culture.  Nothing further is needed.

## 2016-04-01 NOTE — Telephone Encounter (Signed)
Order's have been placed. LMOMTCB x1

## 2016-04-02 ENCOUNTER — Other Ambulatory Visit: Payer: Self-pay | Admitting: Orthopedic Surgery

## 2016-04-02 DIAGNOSIS — G8929 Other chronic pain: Secondary | ICD-10-CM

## 2016-04-02 DIAGNOSIS — M25572 Pain in left ankle and joints of left foot: Principal | ICD-10-CM

## 2016-04-04 ENCOUNTER — Encounter (HOSPITAL_COMMUNITY): Payer: Self-pay | Admitting: Licensed Clinical Social Worker

## 2016-04-04 ENCOUNTER — Ambulatory Visit (INDEPENDENT_AMBULATORY_CARE_PROVIDER_SITE_OTHER): Payer: 59 | Admitting: Licensed Clinical Social Worker

## 2016-04-04 ENCOUNTER — Other Ambulatory Visit: Payer: Managed Care, Other (non HMO)

## 2016-04-04 DIAGNOSIS — R05 Cough: Secondary | ICD-10-CM

## 2016-04-04 DIAGNOSIS — R059 Cough, unspecified: Secondary | ICD-10-CM

## 2016-04-04 DIAGNOSIS — F3162 Bipolar disorder, current episode mixed, moderate: Secondary | ICD-10-CM

## 2016-04-04 DIAGNOSIS — J42 Unspecified chronic bronchitis: Secondary | ICD-10-CM

## 2016-04-04 NOTE — Progress Notes (Signed)
   THERAPIST PROGRESS NOTE  Session Time: 2:10-3:00pm  Participation Level: Active  Behavioral Response: CasualAlertAnxious  Type of Therapy: Individual Therapy  Treatment Goals addressed: Coping  Interventions: Supportive  Summary: Alex [name] is a 60 y.o. male. He presents positively. He is usually negative. He said he wanted to begin the session with 2 positives. Pt's son got a job and he and his wife had a great visit in [place] visiting his daughter. Pt still has ongoing health issues that he is constantly attending to. He gets depressed wihen he thinks about his station in life ([place], no steady income, bills piling up). Asked pt to compile a gratitude list. Told pt it was refreshing to see him upbeat today.  Suicidal/Homicidal: Nowithout intent/plan  Therapist Response: Assessed pts current functioning and reviewed progress. Assisted pt processing issues with family, health issues and processing for management of stressors.  Plan: Return again in 2 weeks.  Diagnosis: Axis I: Bipolar 1 disorder, mixed, moderate        MACKENZIE,LISBETH S, LCAS-A 04/04/2016

## 2016-04-07 LAB — RESPIRATORY CULTURE OR RESPIRATORY AND SPUTUM CULTURE: Organism ID, Bacteria: NORMAL

## 2016-04-08 ENCOUNTER — Ambulatory Visit
Admission: RE | Admit: 2016-04-08 | Discharge: 2016-04-08 | Disposition: A | Discharge status: Home / Self Care | Payer: Managed Care, Other (non HMO) | Source: Ambulatory Visit | Attending: Orthopedic Surgery | Admitting: Orthopedic Surgery

## 2016-04-08 DIAGNOSIS — G8929 Other chronic pain: Secondary | ICD-10-CM

## 2016-04-08 DIAGNOSIS — M25572 Pain in left ankle and joints of left foot: Principal | ICD-10-CM

## 2016-04-10 ENCOUNTER — Other Ambulatory Visit: Payer: Self-pay

## 2016-04-22 ENCOUNTER — Ambulatory Visit (INDEPENDENT_AMBULATORY_CARE_PROVIDER_SITE_OTHER): Payer: Managed Care, Other (non HMO) | Admitting: Pulmonary Disease

## 2016-04-22 ENCOUNTER — Telehealth: Payer: Self-pay

## 2016-04-22 DIAGNOSIS — R05 Cough: Secondary | ICD-10-CM | POA: Diagnosis not present

## 2016-04-22 DIAGNOSIS — R059 Cough, unspecified: Secondary | ICD-10-CM

## 2016-04-22 LAB — PULMONARY FUNCTION TEST
DL/VA % pred: 101 %
DL/VA: 4.78 ml/min/mmHg/L
DLCO cor % pred: 99 %
DLCO cor: 34 ml/min/mmHg
DLCO unc % pred: 101 %
DLCO unc: 34.38 ml/min/mmHg
FEF 25-75 Post: 3.58 L/sec
FEF 25-75 Pre: 3.47 L/sec
FEF2575-%Change-Post: 3 %
FEF2575-%Pred-Post: 120 %
FEF2575-%Pred-Pre: 116 %
FEV1-%Change-Post: 0 %
FEV1-%Pred-Post: 107 %
FEV1-%Pred-Pre: 106 %
FEV1-Post: 3.98 L
FEV1-Pre: 3.95 L
FEV1FVC-%Change-Post: 1 %
FEV1FVC-%Pred-Pre: 103 %
FEV6-%Change-Post: 0 %
FEV6-%Pred-Post: 107 %
FEV6-%Pred-Pre: 108 %
FEV6-Post: 5.03 L
FEV6-Pre: 5.06 L
FEV6FVC-%Change-Post: 0 %
FEV6FVC-%Pred-Post: 105 %
FEV6FVC-%Pred-Pre: 104 %
FVC-%Change-Post: 0 %
FVC-%Pred-Post: 102 %
FVC-%Pred-Pre: 103 %
FVC-Post: 5.04 L
FVC-Pre: 5.09 L
Post FEV1/FVC ratio: 79 %
Post FEV6/FVC ratio: 100 %
Pre FEV1/FVC ratio: 78 %
Pre FEV6/FVC Ratio: 100 %
RV % pred: 112 %
RV: 2.66 L
TLC % pred: 103 %
TLC: 7.5 L

## 2016-04-22 NOTE — Progress Notes (Signed)
PFT 04/22/16: FVC 5.09 L (103%) FEV1 3.95 L (106%) FEV1/FVC 0.78 FEF 25-75 3.47 L (116%) negative bronchodilator response TLC 7.50 L (103%) RV 112% ERV 98% DLCO corrected 99% (Hgb 15.0)

## 2016-04-22 NOTE — Progress Notes (Signed)
6MWT 04/22/16:  Walked 483 meters / Baseline Sat 98% on RA / Nadir Sat 97% on RA  IMAGING BARIUM SWALLOW 03/19/16 (per radiologist): Narrowed lower cervical esophagus possibly due to prior radiation treatment as well as prominent cricopharyngeus muscle. Tiny sliding-type hiatal hernia. Moderate gastroesophageal reflux. Moderate tertiary contractions.  HRCT CHEST 03/19/16 (personally reviewed by me): No lung opacity or intralobular septal thickening to suggest interstitial lung disease. Mild bronchiectasis noted. No pulmonary nodule or mass appreciated. No pleural effusion or thickening. No pathologic mediastinal adenopathy. No pericardial effusion.  MICROBIOLOGY Sputum Ctx (04/04/16):  Oral Flora / Fungus Ctx pending  Sputum Ctx (03/12/16):  AFB negative

## 2016-04-22 NOTE — Telephone Encounter (Signed)
S/w pt we received labs from [facility]. Faxed to Dr Ashok Cordia. Given to Dr Jana Hakim for review.

## 2016-04-23 ENCOUNTER — Ambulatory Visit (INDEPENDENT_AMBULATORY_CARE_PROVIDER_SITE_OTHER): Payer: Managed Care, Other (non HMO) | Admitting: Pulmonary Disease

## 2016-04-23 ENCOUNTER — Telehealth: Payer: Self-pay

## 2016-04-23 ENCOUNTER — Telehealth: Payer: Self-pay | Admitting: Pulmonary Disease

## 2016-04-23 ENCOUNTER — Encounter: Payer: Self-pay | Admitting: Pulmonary Disease

## 2016-04-23 VITALS — BP 110/68 | HR 94 | Ht 73.0 in | Wt 178.6 lb

## 2016-04-23 DIAGNOSIS — K219 Gastro-esophageal reflux disease without esophagitis: Secondary | ICD-10-CM

## 2016-04-23 DIAGNOSIS — D839 Common variable immunodeficiency, unspecified: Secondary | ICD-10-CM | POA: Diagnosis not present

## 2016-04-23 DIAGNOSIS — J42 Unspecified chronic bronchitis: Secondary | ICD-10-CM

## 2016-04-23 DIAGNOSIS — Z23 Encounter for immunization: Secondary | ICD-10-CM | POA: Diagnosis not present

## 2016-04-23 DIAGNOSIS — J479 Bronchiectasis, uncomplicated: Secondary | ICD-10-CM

## 2016-04-23 MED ORDER — FLUTTER DEVI: 0 refills | Status: DC

## 2016-04-23 MED ORDER — ALBUTEROL SULFATE HFA 108 (90 BASE) MCG/ACT IN AERS: 2.0000 | INHALATION_SPRAY | RESPIRATORY_TRACT | 3 refills | Status: DC | PRN

## 2016-04-23 NOTE — Telephone Encounter (Signed)
Per Dorothyann Peng. This was opened in error. Will sign off

## 2016-04-23 NOTE — Telephone Encounter (Signed)
Pt saw Dr Ashok Cordia today, he has 2 questions: He is wondering why his last few labs are showing WBC are higher more recently. He is wondering with his ferritin being low why his Hgb is so good.

## 2016-04-23 NOTE — Addendum Note (Signed)
Addended by: Inge Rise on: 04/23/2016 09:54 AM   Modules accepted: Orders

## 2016-04-23 NOTE — Progress Notes (Signed)
Subjective:    Patient ID: Alex [name], male    DOB: July 24, 1956, 60 y.o.   MRN: [id number]  C.C.:  Follow-up for Chronic Bronchitis/Mild Bronchiectasis, GERD, & CVID.  HPI Chronic Bronchitis/Mild Bronchiectasis:  He reports he has had a mild, intermittent cough the last couple of days. He is producing a "light yellow" mucus. Denies any hemoptysis. Has had intermittent wheezing. Denies any recent dyspnea. He does report ProAir seems to help his cough.   GERD:  Currently on Aciphex. He reports he does have reflux depending on diet. Currently follows with GI Oletta Lamas).   CVID:  Follows with allergy. Currently on immune replacement.   Review of Systems He does report some mild sinus congestion & pressure the last couple of days. No fever, chills, or sweats. No nausea or emesis.    Allergies  Allergen Reactions  . Percocet [Oxycodone-Acetaminophen] Itching    Can take generic.  States only has a problem with percocet brand    Current Outpatient Prescriptions on File Prior to Visit  Medication Sig Dispense Refill  . albuterol (PROVENTIL HFA;VENTOLIN HFA) 108 (90 Base) MCG/ACT inhaler Inhale 2 puffs into the lungs every 6 (six) hours as needed for wheezing or shortness of breath.    . ALPHAGAN P 0.1 % SOLN Place 1 drop into both eyes 2 (two) times daily.     [name] aspirin EC 81 MG tablet Take 1 tablet by mouth daily.    [name] atorvastatin (LIPITOR) 40 MG tablet TAKE 1 TABLET (40 MG TOTAL) BY MOUTH DAILY. 90 tablet 3  . AXIRON 30 MG/ACT SOLN Place 2 Act onto the skin daily.    . benzonatate (TESSALON) 100 MG capsule Take 1-2 capsules (100-200 mg total) by mouth 3 (three) times daily as needed for cough. 90 capsule 1  . Coenzyme Q10 (CO Q 10 PO) Take by mouth daily.    [name] dicyclomine (BENTYL) 20 MG tablet TAKE 1 TABLET 3 TIMES A DAY AS NEEDED  6  . fluconazole (DIFLUCAN) 150 MG tablet Take 1 tablet (150 mg total) by mouth once. Reported on 08/04/2015 4 tablet 2  . Immune Globulin, Human,  (HIZENTRA) 4 GM/20ML SOLN Inject into the skin once a week. wednesday    . LamoTRIgine 300 MG TB24 TAKE 1 TABLET (300 MG TOTAL) BY MOUTH DAILY. 90 tablet 0  . lidocaine-prilocaine (EMLA) cream as needed.    [name] LORazepam (ATIVAN) 0.5 MG tablet Take 1 tab daily as needed and 1 tab at bed time 45 tablet 1  . OMEGA-3 KRILL OIL PO Take by mouth daily.    . promethazine-codeine (PHENERGAN WITH CODEINE) 6.25-10 MG/5ML syrup Take 5 mLs by mouth every 4 (four) hours as needed. 300 mL 0  . RABEprazole (ACIPHEX) 20 MG tablet TAKE 1 TABLET BY MOUTH EVERY DAY--  takes in pm    . sucralfate (CARAFATE) 1 G tablet Take 1 g by mouth as needed.   3  . SYNTHROID 150 MCG tablet Take 1 tablet (150 mcg total) by mouth daily. Brand name only please. Dispense as written 1 90 tablet 3  . telmisartan (MICARDIS) 80 MG tablet Take 1 tablet (80 mg total) by mouth daily. 90 tablet 3  . zolpidem (AMBIEN) 10 MG tablet Take 1 tablet (10 mg total) by mouth at bedtime as needed. for sleep 30 tablet 0  . fluticasone furoate-vilanterol (BREO ELLIPTA) 100-25 MCG/INH AEPB Inhale 1 puff into the lungs daily. (Patient not taking: Reported on 04/23/2016) 1 each 3  No current facility-administered medications on file prior to visit.     Past Medical History:  Diagnosis Date  . Anxiety   . At risk for sleep apnea    STOP-BANG= 5    SENT TO PCP 04-22-2014  . Bipolar I disorder ([place])   . Bleeding ulcer 06/2014  . BPH (benign prostatic hypertrophy) with urinary obstruction   . Chronic back pain   . Complication of anesthesia POST URINARY RETENTION---  2006 SHOULDER SURGERY MARKED BRADYCARDIA VAGAL RESPONSE NO ISSUE W/ SURGERY AFTER THIS ONE  . Coronary atherosclerosis CARDIOLOGIST- DR CRENSHAW--  LAST VISIT 01-05-2012 IN EPIC   NON-OBSTRUCTIVE MILD DISEASE  . 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 Depression   . Epicondylitis    right elbow  . GERD (gastroesophageal reflux disease)   . Glaucoma BOTH EYES -- NO DROPS     . History of chronic prostatitis   . 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 History of thyroid cancer PRIMARY (NO METS FROM ORBITAL CANCER)--   IN REMISSION   S/P TOTAL THYROIDECTOMY  , CHEMORADIATION  (ONCOLOGIST -- DR Griffith Citron)  . Hyperlipidemia   . Hypertension   . Immune deficiency disorder ([place])    COMMON VARIABLE  . Nocturia   . OA (osteoarthritis)   . Positional vertigo   . Renal calculi LEFT KIDNEY-- NON-OBSTRUCTIVE  . Ulnar nerve compression    right elbow  . Urinary hesitancy     Past Surgical History:  Procedure Laterality Date  . CARDIAC CATHETERIZATION  01-16-2006  DR Marcello Moores WALL   MILD CORONARY ATHEROSCLEROSIS/ MID TO DISTAL LAD 40% STENOSIS/ LVF 50-55%  . KNEE ARTHROSCOPY  05/01/2012   Procedure: ARTHROSCOPY KNEE;  Surgeon: Johnn Hai, MD;  Location: [facility];  Service: Orthopedics;  Laterality: Left;  debridement and removal of loose body  . LEFT ANKLE ARTHROSCOPY W/ DEBRIDEMENT  05-12-2007  . LEFT HYDROCELECTOMY  03-29-2005   AND REPAIR LEFT INGUINAL HERNIA W/ MESH  . MOHS SURGERY  2002   RIGHT ORBITAL CANCER  . NASAL ENDOSCOPY  08-07-2005   RIGHT EPISTAXIS  / POST SEPTOPLASTY  (HX RIGHT ORBITAL CA & S/P RADIATION/ NECROSIS ANTERIOR END OF BOTH INFERIOR TURBINATES)  . occuloplastic surgery  2002  . PARS PLANA VITRECTOMY  11-06-2004   RIGHT EYE RADIATION RETINOPATHY W/ HEMORRHAGE  . REPAIR UNDESENDED RIGHT TESTICLE / RIGHT INGUINAL HERNIA  AGE 45  . RIGHT ANKLE ARTHROSCOPY W/ EXTENSIVE DEBRIDEMENT  04-05-2008   . RIGHT SUPRAOMOHYOID NECK DISSECTION   03-08-2003   ZONES 1,2,3;   SUBMANDIBULAR MASS / METASTATIC SQUAMOUS CELL CARCINOMA RIGHT NECK  . SEPTOPLASTY  NOV 2006  . SHOULDER ARTHROSCOPY W/ SUBACROMIAL DECOMPRESSION AND DISTAL CLAVICLE EXCISION  10-09-2008   AND  DEBRIDEMENT OF RIGHT SHOULDER IMPINGEMENT & [facility] JOINT ARTHRITIS  . SPINE SURGERY    . TOTAL THYROIDECTOMY  11-03-2001   PAPILLARY THYROID CARCINOMA  . TRANSTHORACIC ECHOCARDIOGRAM  12/ 2012   grade I diastolic dysfunction/ ef 0000000  . ULNAR NERVE TRANSPOSITION Right 04/28/2014   Procedure: RIGHT ELBOW ULNA NERVE RELEASE TRANSPOSTION AND MEDIAL EPICONDYLAR DEBRIDEMENT AND REPAIR;  Surgeon: Linna Hoff, MD;  Location: [place];  Service: Orthopedics;  Laterality: Right;    Family History  Problem Relation  Age of Onset  . Depression Sister   . Rectal cancer Sister   . Lung cancer Brother   . Kidney disease Mother   . Depression Mother   . Stroke Father   . COPD Father   . Hypertension Father   . Prostate cancer Father   . Depression Daughter   . Drug abuse Daughter   . Psychiatric Illness Son     Social History   Social History  . Marital status: Married    Spouse name: N/A  . Number of children: N/A  . Years of education: N/A   Social History Main Topics  . Smoking status: Never Smoker  . Smokeless tobacco: Never Used  . Alcohol use 0.0 oz/week     Comment: 1-2 glasses per month  . Drug use: No  . Sexual activity: Not Asked   Other Topics Concern  . None   Social History Narrative   Originally from [place]. Moved to [place] in 1984. 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 No international travel other than [place]. Has a dog at home, poodle mix. His daughter who lives with him now has a dog. No mold exposure. No bird exposure. No hot tub exposure. Enjoys gardening.       Objective:   Physical Exam BP 110/68 (BP Location: Left Arm, Cuff Size: Normal)   Pulse 94   Ht 6\' 1"  (1.854 m)   Wt 178 lb 9.6 oz (81 kg)   SpO2 95%   BMI 23.56 kg/m  General:  Awake. Alert. No distress. Integument:  Warm & dry. No rash on  exposed skin. Lymphatics:  No appreciated cervical or supraclavicular lymphadenoapthy. HEENT:  Moist mucus membranes. No oral ulcers. No scleral icterus.  Cardiovascular:  Regular rate. No edema. Normal S1 & S2. Pulmonary:  Clear bilaterally to auscultation. Normal work of breathing on room air. Intermittent nonproductive cough. Abdomen: Soft. Normal bowel sounds. Nondistended. Grossly nontender.  PFT 04/22/16: FVC 5.09 L (103%) FEV1 3.95 L (106%) FEV1/FVC 0.78 FEF 25-75 3.47 L (116%) negative bronchodilator response TLC 7.50 L (103%) RV 112% ERV 98% DLCO corrected 99% (Hgb 15.0)  6MWT 04/22/16:  Walked 483 meters / Baseline Sat 98% on RA / Nadir Sat 97% on RA  IMAGING BARIUM SWALLOW 03/19/16 (per radiologist): Narrowed lower cervical esophagus possibly due to prior radiation treatment as well as prominent cricopharyngeus muscle. Tiny sliding-type hiatal hernia. Moderate gastroesophageal reflux. Moderate tertiary contractions.  HRCT CHEST 03/19/16 (personally reviewed by me): No lung opacity or intralobular septal thickening to suggest interstitial lung disease. Mild bronchiectasis noted. No pulmonary nodule or mass appreciated. No pleural effusion or thickening. No pathologic mediastinal adenopathy. No pericardial effusion.  PORT CXR 03/03/16 (per radiologist): Heart normal in size. Lungs clear. No acute disease.  CXR PA/LAT 11/10/15 (previously reviewed by me): No focal opacity or mass appreciated. No pleural effusion. Heart normal in size & mediastinum normal in contour.  MICROBIOLOGY Sputum Ctx (04/04/16):  Oral Flora / Fungus Ctx pending  Sputum Ctx (03/12/16):  AFB negative  LABS 04/19/16 CBC: 13.3/16.5/48.2/224 Ferritin: 26  03/03/16 CBC: 10.1/15.5/46.0/288 BMP: 140/4.1/107/25/24/1.1/105/8.7  3/10/8 RF:  <20.0    Assessment & Plan:  60 y.o. male with chronic bronchitis with mild bronchiectasis along with common variable immunodeficiency. Patient also has underlying reflux that  seems to be moderate even on AcipHex based on barium swallow. I  do feel as though his chronic bronchitis and bronchiectasis are likely secondary to his underlying CVID. Therefore, I am not ordering any further immunologic workup. I reviewed his high-resolution CT scan findings today with him which does show mild bronchiectasis as well as his barium swallow showing reflux. We also discussed his pulmonary function testing which is completely normal on my review as well as his normal 6 minute walk test on room air. I instructed the patient to contact my office if you have any new breathing problems or questions before his next appointment.  1. Chronic Bronchitis/Mild Bronchiectasis: Continue albuterol inhaler as needed. Starting airway clearance with a cappella/flutter valve twice daily. May require hypertonic saline if this is ineffective. Continuing Mucinex at least once daily.  2. GERD:  Patient continuing on AcipHex. Has follow-up with GI. Recommended close monitoring of symptoms. 3. CVID: Following with allergist for immunoglobulin replacement.  4. Health Maintenance:  S/P Prevnar April 2015 & Pneumovax 03 June 2012. Administering influenza vaccine today. 5. Follow-up: Patient to return to clinic in 3 months or sooner if needed.  Sonia Baller Ashok Cordia, [name] [facility] Pager:  [phone] After 3pm or if no response, call [phone] 9:49 AM 04/23/16

## 2016-04-23 NOTE — Patient Instructions (Signed)
   Use your Acapella/Flutter Valve by blowing into the device 3 times in a row in the morning and evening. This should help trigger a cough and mucus clearance.  Continue using your Mucinex/Guaifenesin daily to help with your mucus.  Pay attention to the frequency of your reflux. We want this to be essentially zero.  Call me if you have any new breathing problems or questions before your next appointment.  I will see you back in 3 months or sooner if needed.

## 2016-04-24 ENCOUNTER — Ambulatory Visit (INDEPENDENT_AMBULATORY_CARE_PROVIDER_SITE_OTHER): Payer: Managed Care, Other (non HMO) | Admitting: Licensed Clinical Social Worker

## 2016-04-24 DIAGNOSIS — F3162 Bipolar disorder, current episode mixed, moderate: Secondary | ICD-10-CM

## 2016-04-25 ENCOUNTER — Encounter (HOSPITAL_COMMUNITY): Payer: Self-pay | Admitting: Licensed Clinical Social Worker

## 2016-04-25 ENCOUNTER — Telehealth: Payer: Self-pay | Admitting: Pulmonary Disease

## 2016-04-25 NOTE — Telephone Encounter (Signed)
Pt was given RX for flutter valve in the office to take to [facility] as we did not have any flutter valves in the office.   I called spoke with pt. He reports he prefers to go through his insurance first to get this approved. Reports he has already submitted the information to his insurance and is awaiting approval. He wanted to know if we had received any in the office yet will he was waiting. I advised we have not. He needed nothing further needed

## 2016-04-25 NOTE — Progress Notes (Signed)
Therapist Progress Note   Time: 1:10-2:00 pm  Participation Level: Active  Behavioral Response: Casual, Alert, Anxious  Type of Therapy:  Individual Therapy  Treatment Goals Addressed: Coping  Interventions: Supportive/ CBT  Summary: Pt presented for individual therapy. He seemed less anxious today. Pt shared he has a new  chronic medical condition that involves the inflammation of his bronchial tubes. Processed this with pt. He says' It's another nail in the coffin." Processed the meaning of this. He feels frustrated about all his medical conditions. Processed with pt that none of his medical conditions are life threatening. He acquiesed and agreed none of the conditions are life threatening. He then spoke about how his wife is not nurturing. Pt understands he cannot change her or make her be different. Pt is also feeling frustrated with his family. They are going to [place] for his father's birthday. He and his wife will be staying at separate housing while there. Pt was receptive to using some of his coping skills to deal with his stress and anxiety. He was receptive to suggestions during the session.  Suicidal/Homicidal: No  Therapist Response: Assessed pt's current functioning and reviewed progress. Assisted pt processing issues with family, health issues and processing for management of stressors.  Plan: Return in 2 weeks  Diagnosis: Axis 1: Bipolar 1 disorder, mixed, moderate  [name], LCAS-A

## 2016-04-26 LAB — AFB CULTURE WITH SMEAR (NOT AT ARMC)

## 2016-05-04 ENCOUNTER — Telehealth: Payer: Self-pay | Admitting: Pulmonary Disease

## 2016-05-04 MED ORDER — PREDNISONE 10 MG PO TABS: 20.0000 mg | ORAL_TABLET | Freq: Every day | ORAL | 0 refills | Status: AC

## 2016-05-04 MED ORDER — DOXYCYCLINE HYCLATE 100 MG PO TABS: 100.0000 mg | ORAL_TABLET | Freq: Two times a day (BID) | ORAL | 0 refills | Status: DC

## 2016-05-04 NOTE — Telephone Encounter (Signed)
Called by patient with cough, mucus production for several days after a trip to [place] with sick contacts. Chart reviewed, bronchiectasis. Rx doxycycline and prednisone Call office if no improvement by Monday

## 2016-05-04 NOTE — Telephone Encounter (Signed)
Called by Alex [name] who relates that the prescriptions for Doxycycline and Prednisone that Dr. Lake Bells ordered for him this morning were never received by the pharmacy. Called [facility] and [employer] and ordered: Doxycycline 100 mg, # 14, Sig: 1 tablet PO BID, no refill and Prednisone 10 mg, #30, Sig: 2 tablets PO day, no refill. Patient was told to call the office Monday if not better, If he gets acutely SOB, runs a fever or has in increase in sputum amount or color he should go the Emergency Department for further evaluation.

## 2016-05-06 LAB — FUNGUS CULTURE W SMEAR

## 2016-05-07 ENCOUNTER — Ambulatory Visit (INDEPENDENT_AMBULATORY_CARE_PROVIDER_SITE_OTHER): Payer: Managed Care, Other (non HMO) | Admitting: Psychiatry

## 2016-05-07 ENCOUNTER — Encounter (HOSPITAL_COMMUNITY): Payer: Self-pay | Admitting: Psychiatry

## 2016-05-07 DIAGNOSIS — F3162 Bipolar disorder, current episode mixed, moderate: Secondary | ICD-10-CM

## 2016-05-07 DIAGNOSIS — F319 Bipolar disorder, unspecified: Secondary | ICD-10-CM | POA: Diagnosis not present

## 2016-05-07 MED ORDER — LORAZEPAM 0.5 MG PO TABS: ORAL_TABLET | ORAL | 2 refills | Status: DC

## 2016-05-07 MED ORDER — ZOLPIDEM TARTRATE 10 MG PO TABS: 10.0000 mg | ORAL_TABLET | Freq: Every evening | ORAL | 2 refills | Status: DC | PRN

## 2016-05-07 MED ORDER — LAMOTRIGINE ER 300 MG PO TB24: 1.0000 | ORAL_TABLET | Freq: Every day | ORAL | 0 refills | Status: DC

## 2016-05-07 NOTE — Progress Notes (Signed)
[facility] [id number] Progress Note  JOEANGEL PROROK [id number] 60 y.o.  05/07/2016 3:49 PM  Chief Complaint:  I have been sick.  I've been seeing specialist and no one diagnose me until I saw pulmonologist who diagnosed with bronchiectasis I'm taking medication and feeling better.       History of Present Illness:  Ivan came for his followup appointment.  He reported since February he has been sick on and off and someone was very bad.  He has taken numerous medication without any relief finally he saw a pulmonologist who diagnosed with chronic basis and now he is taking medication with good relief.  He continues to have irritability, anger, mood swing because her family.  But is relieved that his son is making progress and now he start working with a company in [employer].  But he is very frustrated with his daughter who does not help much at home.  Patient told she does not pay her utility bills on time.  He believes since she moved in with the dog her breathing has been worsened.  He is taking Lamictal, Ambien and lorazepam.  He does not want to increase his medication.  He is seeing therapist in this office.  Recently he visited [place] to see his father on his 89th per day.  Patient told he really enjoyed the company because he is worried his father is having memory problem and may be in the future he may not recognize him.  His appetite is okay.  His energy level is fair.  He denies any crying spells but sometimes does get frustrated and irritable with the family members.  However he denies any mania, psychosis, hallucination or any suicidal thoughts.  He has no tremors or shakes.  He likes to continue his current psychiatric medication.  Patient denies drinking or using any illegal substances.   Suicidal Ideation: No Plan Formed: No Patient has means to carry out plan: No  Homicidal Ideation: No Plan Formed: No Patient has means to carry out plan: No  Review of  Systems: Psychiatric: Agitation: No Hallucination: No Depressed Mood: No Insomnia: No Hypersomnia: No Altered Concentration: No Feels Worthless: No Grandiose Ideas: No Belief In Special Powers: No New/Increased Substance Abuse: No Compulsions: No  Neurologic: Headache: No Seizure: No Paresthesias: No  Medical  history Patient has history of hypertension , immune deficiency syndrome , GERD , Tick disorder, coronary artery disease, hypertension, COPD, esophagus stricture, lumbago, osteoarthritis, malignant neoplasm of orbit, vertigo, osteoarthritis and chronic fatigue.   Outpatient Encounter Prescriptions as of 05/07/2016  Medication Sig Dispense Refill  . albuterol (PROVENTIL HFA;VENTOLIN HFA) 108 (90 Base) MCG/ACT inhaler Inhale 2 puffs into the lungs every 4 (four) hours as needed for wheezing or shortness of breath. 1 Inhaler 3  . ALPHAGAN P 0.1 % SOLN Place 1 drop into both eyes 2 (two) times daily.     [name] aspirin EC 81 MG tablet Take 1 tablet by mouth daily.    [name] atorvastatin (LIPITOR) 40 MG tablet TAKE 1 TABLET (40 MG TOTAL) BY MOUTH DAILY. 90 tablet 3  . AXIRON 30 MG/ACT SOLN Place 2 Act onto the skin daily.    . benzonatate (TESSALON) 100 MG capsule Take 1-2 capsules (100-200 mg total) by mouth 3 (three) times daily as needed for cough. 90 capsule 1  . Coenzyme Q10 (CO Q 10 PO) Take by mouth daily.    [name] dicyclomine (BENTYL) 20 MG tablet TAKE 1 TABLET 3 TIMES A DAY  AS NEEDED  6  . doxycycline (VIBRA-TABS) 100 MG tablet Take 1 tablet (100 mg total) by mouth 2 (two) times daily. 14 tablet 0  . fluconazole (DIFLUCAN) 150 MG tablet Take 1 tablet (150 mg total) by mouth once. Reported on 08/04/2015 4 tablet 2  . Immune Globulin, Human, (HIZENTRA) 4 GM/20ML SOLN Inject into the skin once a week. wednesday    . LamoTRIgine 300 MG TB24 Take 1 tablet (300 mg total) by mouth daily. 90 tablet 0  . lidocaine-prilocaine (EMLA) cream as needed.    [name] LORazepam (ATIVAN) 0.5 MG tablet Take 1  tab daily as needed and 1 tab at bed time 45 tablet 2  . LUTEIN PO Take by mouth daily.    . OMEGA-3 KRILL OIL PO Take by mouth daily.    . predniSONE (DELTASONE) 10 MG tablet Take 2 tablets (20 mg total) by mouth daily. 30 tablet 0  . promethazine-codeine (PHENERGAN WITH CODEINE) 6.25-10 MG/5ML syrup Take 5 mLs by mouth every 4 (four) hours as needed. 300 mL 0  . RABEprazole (ACIPHEX) 20 MG tablet TAKE 1 TABLET BY MOUTH EVERY DAY--  takes in pm    . Respiratory Therapy Supplies (FLUTTER) DEVI Use as directed 1 each 0  . Saw Palmetto, Serenoa repens, (SAW PALMETTO PO) Take by mouth.    . sucralfate (CARAFATE) 1 G tablet Take 1 g by mouth as needed.   3  . SYNTHROID 150 MCG tablet Take 1 tablet (150 mcg total) by mouth daily. Brand name only please. Dispense as written 1 90 tablet 3  . telmisartan (MICARDIS) 80 MG tablet Take 1 tablet (80 mg total) by mouth daily. 90 tablet 3  . zolpidem (AMBIEN) 10 MG tablet Take 1 tablet (10 mg total) by mouth at bedtime as needed. for sleep 30 tablet 2  . [DISCONTINUED] LamoTRIgine 300 MG TB24 TAKE 1 TABLET (300 MG TOTAL) BY MOUTH DAILY. 90 tablet 0  . [DISCONTINUED] LORazepam (ATIVAN) 0.5 MG tablet Take 1 tab daily as needed and 1 tab at bed time 45 tablet 1  . [DISCONTINUED] zolpidem (AMBIEN) 10 MG tablet Take 1 tablet (10 mg total) by mouth at bedtime as needed. for sleep 30 tablet 0  . fluticasone furoate-vilanterol (BREO ELLIPTA) 100-25 MCG/INH AEPB Inhale 1 puff into the lungs daily. (Patient not taking: Reported on 05/07/2016) 1 each 3   No facility-administered encounter medications on file as of 05/07/2016.     Past Psychiatric History/Hospitalization(s): Patient has been seeing in this office since 2009.  He has history of depression mood swing and anger.  [facility] to suicidal thoughts but he denies any suicidal attempt.  He denies any psychosis but admitted poor impulse control.  In the past he has taken  Seroquel and Cymbalta.   Anxiety: Yes Bipolar Disorder: Yes Depression: Yes Mania: No Psychosis: No Schizophrenia: No Personality Disorder: No Hospitalization for psychiatric illness: Yes History of Electroconvulsive Shock Therapy: No Prior Suicide Attempts: No  Physical Exam: Constitutional:  BP 124/74 (BP Location: Right Arm, Patient Position: Sitting, Cuff Size: Normal)   Pulse 85   Ht 6\' 1"  (1.854 m)   Wt 177 lb (80.3 kg)   SpO2 94%   BMI 23.35 kg/m   General Appearance: alert, oriented, no acute distress  Musculoskeletal: Strength & Muscle Tone: within normal limits Gait & Station: normal Patient leans: N/A  Review of Systems  Constitutional: Negative for fever.  Musculoskeletal: Positive for back pain.  Skin: Negative for itching and rash.  Neurological: Negative for dizziness and headaches.  Psychiatric/Behavioral: Negative for depression, hallucinations, substance abuse and suicidal ideas. The patient is nervous/anxious. The patient does not have insomnia.    Recent Results (from the past 2160 hour(s))  AFB culture with smear (NOT at [facility])     Status: None   Collection Time: 03/12/16  4:22 PM  Result Value Ref Range   Source: ;    Acid Fast Bacilli (AFB) Culture       Comment:   MYCOBACTERIA, CULTURE, WITH FLUOROCHROME SMEAR       MICRO NUMBER:      [id number]   TEST STATUS:       FINAL   SPECIMEN SOURCE:   SPUTUM   SPECIMEN QUALITY:  ADEQUATE   SMEAR:             No acid fast bacilli seen.   RESULT:            No Mycobacterium species isolated after                      6 weeks incubation.   Fungus Culture with Smear     Status: None   Collection Time: 03/12/16  4:22 PM  Result Value Ref Range   Source: CANCELED     Comment: Result canceled by the ancillary   Fungus (Mycology) Culture CANCELED     Comment: Result canceled by the ancillary  Respiratory or Resp and Sputum Culture     Status: None   Collection Time: 04/04/16 11:43 AM  Result Value Ref  Range   Gram Stain Moderate    Gram Stain WBC present-both PMN and Mononuclear    Gram Stain Moderate Squamous Epithelial Cells Present    Gram Stain Moderate Gram Positive Cocci In Pairs    Gram Stain Moderate Gram Positive Rods    Organism ID, Bacteria Normal Oropharyngeal Flora   Fungus Culture with Smear     Status: Abnormal   Collection Time: 04/04/16 11:43 AM  Result Value Ref Range   Source: SPUTUM    Fungus (Mycology) Culture   (A)     Comment:   CULTURE, FUNGUS W/SMEAR NOT HAIR, SKIN, BLOOD       MICRO NUMBER:      [id number]   TEST STATUS:       FINAL   SPECIMEN SOURCE:   SPUTUM   SPECIMEN QUALITY:  ADEQUATE   SMEAR:             No fungal elements seen.   RESULT:            Light growth of Yeast present not further identified                      No additional fungi isolated after 4 weeks   Pulmonary function test     Status: None   Collection Time: 04/22/16  4:00 PM  Result Value Ref Range   FVC-Pre 5.09 L   FVC-%Pred-Pre 103 %   FVC-Post 5.04 L   FVC-%Pred-Post 102 %   FVC-%Change-Post 0 %   FEV1-Pre 3.95 L   FEV1-%Pred-Pre 106 %   FEV1-Post 3.98 L   FEV1-%Pred-Post 107 %   FEV1-%Change-Post 0 %   FEV6-Pre 5.06 L   FEV6-%Pred-Pre 108 %   FEV6-Post 5.03 L   FEV6-%Pred-Post 107 %   FEV6-%Change-Post 0 %   Pre FEV1/FVC ratio 78 %   FEV1FVC-%Pred-Pre  103 %   Post FEV1/FVC ratio 79 %   FEV1FVC-%Change-Post 1 %   Pre FEV6/FVC Ratio 100 %   FEV6FVC-%Pred-Pre 104 %   Post FEV6/FVC ratio 100 %   FEV6FVC-%Pred-Post 105 %   FEV6FVC-%Change-Post 0 %   FEF 25-75 Pre 3.47 L/sec   FEF2575-%Pred-Pre 116 %   FEF 25-75 Post 3.58 L/sec   FEF2575-%Pred-Post 120 %   FEF2575-%Change-Post 3 %   RV 2.66 L   RV % pred 112 %   TLC 7.50 L   TLC % pred 103 %   DLCO unc 34.38 ml/min/mmHg   DLCO unc % pred 101 %   DLCO cor 34.00 ml/min/mmHg   DLCO cor % pred 99 %   DL/VA 1.61 ml/min/mmHg/L   DL/VA % pred 096 %    Mental status examination Patient is casually dressed and  fairly groomed.  He is anxious but maintained a good eye contact.  He described his mood euthymic and affect is appropriate.  He denies any active or passive suicidal thoughts or homicidal thoughts.  He denies any auditory or visual hallucination.  He is restless but there were no tremors or shakes present.   His thought process is logical linear and goal-directed.  There were no flight of ideas present at this time.  His fund of knowledge is adequate.  His attention and concentration is okay.  He's alert and oriented x3.  His insight judgment and impulse control is okay.   Established Problem, Stable/Improving (1), Review of Psycho-Social Stressors (1), Review or order clinical lab tests (1), Decision to obtain old records (1), Review and summation of old records (2), Review of Last Therapy Session (1) and Review of Medication Regimen & Side Effects (2)  Assessment: Axis I:  bipolar disorder1  Axis II:  deferred  Axis III:  see medical history   Patient Active Problem List  Diagnosis  . Malignant neoplasm of orbit (HCC)  . NEOPLASM, MALIGNANT, THYROID GLAND  . Hypogonadism male  . Dyslipidemia  . 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HCC)  . Major depression, chronic (HCC)  . Coronary atherosclerosis  . ESOPHAGEAL STRICTURE  . Orchitis and epididymitis  . GENERALIZED OSTEOARTHROSIS UNSPECIFIED SITE  . Osteoarthritis  . Lumbago  . FOOT PAIN  . FATIGUE  . RASH-NONVESICULAR  . Cough  . CHANGE IN BOWELS  . IMPAIRED GLUCOSE TOLERANCE  . THYROIDECTOMY, HX OF  . Anxiety state  . Vertigo  . Ataxia  . Ingrowing toenail with infection  . Localized osteoarthritis of left knee  . Hypertension, uncontrolled  . RML pneumonia  . Glaucoma  . Chronic right SI joint pain  . Edema  . Tinnitus  . Pre-operative exam  . Cervical disc disorder with radiculopathy of cervical region  . Right elbow pain  . Elevated WBC count  . Iron deficiency anemia  . Cerumen impaction  . Acute URI  . Chronic  bronchitis (HCC)  . Bronchiectasis (HCC)    Plan:  I reviewed his records, current medication, blood work results and notes from other providers.  He is taking prednisone and he has noticed more anxiety since then.  I explained sometimes steroids can make people more anxious and nervous.  I suggested to observe his anxiety and if it does get worse then he need to contact us.  I encouraged to continue therapy for counseling and social skills.  I will continue Ambien 10 mg half to one tablet as needed, Lamictal 300 mg daily, lorazepam 0.5 mg at bedtime  and second dose as needed.  Patient has no rash, itching, headaches.  Recommended to call us back if he is any question or any concern.  Follow-up in 3 months.  Aleria Maheu T., MD 05/07/2016                Patient ID: Judie Bonus Cristina, male   DOB: 1956/05/31, 60 y.o.   MRN: [id number]

## 2016-05-09 ENCOUNTER — Encounter: Payer: Self-pay | Admitting: Oncology

## 2016-05-16 ENCOUNTER — Encounter: Payer: Self-pay | Admitting: *Deleted

## 2016-05-16 ENCOUNTER — Ambulatory Visit (INDEPENDENT_AMBULATORY_CARE_PROVIDER_SITE_OTHER): Payer: Managed Care, Other (non HMO) | Admitting: Licensed Clinical Social Worker

## 2016-05-16 DIAGNOSIS — F319 Bipolar disorder, unspecified: Secondary | ICD-10-CM

## 2016-05-16 NOTE — Progress Notes (Signed)
Therapist Progress Note   Time: 3:10-4:00 pm  Participation Level: Active  Behavioral Response: Casual, Alert, Somewhat Anxious  Type of Therapy: Individual Therapy  Treatment Goals Addressed: Coping  Interventions: Supportive/ CBT  Summary: Pt presented for individual therapy. He seemed less anxious today. Pt had just returned from a 90th birthday party for his father in [place]. He saw all his siblings, and extended family. He had some complaints about his siblings but all in all had a wonderful visit with his father. His father has demential and lives in a  Memory care unit.  Pt made extra effort to attend thinking his father may not know he is soon due to the dementia. Processed with pt his feelings surrounding his father's illness. Pt was tearful in describing their relationship. Pt also described the relationship he has with his siblings and wishes things were different. Pt immediately began discussing his children who is his  Main stressor. Pt was receptive to using some of his coping skills to deal with his stress and anxiety when discussing his children. Processed with pt how 2 of his children may live with him indefinitely. He is not happy about that scenario but refuses to use boundaries with them. Discussed with pt if he chooses to not use boundaries then perhaps he needs to work on acceptance. Pt is not sure he is ready for acceptance at this point. He was receptive to suggestions during the intervention.  Suicidal/Homicidal: No  Therapist Response: Assessed pt's current functioning and reviewed progress. Assisted pt processing issues with family,  and processing for management of stressors.  Plan: Return in 2 weeks using CBT  Diagnosis: Axis 1: Bipolar 1 disorder, mixed, moderate  [name], LCAS

## 2016-05-20 ENCOUNTER — Encounter (HOSPITAL_COMMUNITY): Payer: Self-pay | Admitting: Licensed Clinical Social Worker

## 2016-05-20 ENCOUNTER — Encounter: Payer: Self-pay | Admitting: Pulmonary Disease

## 2016-05-21 NOTE — Telephone Encounter (Signed)
Dr Ashok Cordia please advise on e-mail sent by the patient. Pt is requesting an abx. Thanks.   ----- Message -----  From: Joyce Copa. Dishner  Sent: 10/9/20178:14 PM EDT  To: Tera Partridge, MD Subject: Non-Urgent Medical Question  I finally got over the latest Exacerbation of bronchectasis. It lasted 2+ weeks.I was up in [place] at the peak of it.Only had inhaler with me.After starting a course of prednisone Sept 23, it started to get better. Totally settled down a week later.Seems that Prednisone helps the most, although I don't like using it due to side affects and the increase risks of stroke and other serious events while on it.Currently, I am experiencing a lot of mucus production and developing a light cough.If it gets to the point it was 3 weeks ago, I will call.For the record,the antibiotic that I tolerate best is Augmentin as opposed to Doxy. Thanks for your continued professionalism.

## 2016-05-29 ENCOUNTER — Telehealth: Payer: Self-pay | Admitting: Pulmonary Disease

## 2016-05-29 NOTE — Telephone Encounter (Signed)
Per JN: will contact pt tomorrow Called spoke with patient and informed him of the above.  Pt okay with this and voiced his understanding.  He is aware to seek emergency attention if his symptoms worsen.

## 2016-05-29 NOTE — Telephone Encounter (Signed)
Please send Prednisone 40mg  daily x4 days. Please send Doxycycline 100mg  po bid x 7 days. Remind him to take with a full glass of water and remain upright for 1 hour after the Doxycycline. Also remind him to avoid excessive sunlight and not to take the medication with any dairy products.

## 2016-05-29 NOTE — Telephone Encounter (Signed)
Pt c/o wheezing - worse at night, sinus headache and pressure, PND, increase in SOB, increase in cough with little mucus production - clear in color x 5 days. Pt denies CP/tightness and f/c/s. Pt requesting steroid and abx.

## 2016-05-29 NOTE — Telephone Encounter (Signed)
JN please advise below message. Thanks.

## 2016-05-30 NOTE — Telephone Encounter (Signed)
Will send to JN to document once he speaks to the patient.

## 2016-05-31 ENCOUNTER — Other Ambulatory Visit: Payer: Self-pay

## 2016-05-31 MED ORDER — DOXYCYCLINE HYCLATE 100 MG PO TABS: 100.0000 mg | ORAL_TABLET | Freq: Two times a day (BID) | ORAL | 0 refills | Status: DC

## 2016-05-31 MED ORDER — PREDNISONE 20 MG PO TABS: 40.0000 mg | ORAL_TABLET | Freq: Every day | ORAL | 0 refills | Status: DC

## 2016-05-31 NOTE — Telephone Encounter (Signed)
Per chart it looks like medicines were never sent in to patient? lmtcb X1 for patient to make aware of recs. Sent prescriptions to patient's only local pharmacy on file d/t it being after 5:00 on Friday afternoon. Will call back

## 2016-05-31 NOTE — Telephone Encounter (Signed)
Out of town. My recommendations were in my response. Nothing further to add or discuss with the patient at this time unless he clinically worsens.

## 2016-06-03 NOTE — Telephone Encounter (Signed)
Attempted to contact pt. No answer. Will try back. 

## 2016-06-04 ENCOUNTER — Other Ambulatory Visit: Payer: Self-pay | Admitting: Dermatology

## 2016-06-04 DIAGNOSIS — C4492 Squamous cell carcinoma of skin, unspecified: Secondary | ICD-10-CM

## 2016-06-04 DIAGNOSIS — C4491 Basal cell carcinoma of skin, unspecified: Secondary | ICD-10-CM

## 2016-06-04 HISTORY — DX: Basal cell carcinoma of skin, unspecified: C44.91

## 2016-06-04 HISTORY — DX: Squamous cell carcinoma of skin, unspecified: C44.92

## 2016-06-04 NOTE — Telephone Encounter (Signed)
Spoke with pt. He was already aware that these prescriptions were sent in. States he wants to try to beat this on his own without medications. Nothing further was needed.

## 2016-06-04 NOTE — Telephone Encounter (Signed)
Pt returned phone call...contact # [phone].Marland KitchenMarland KitchenMearl Latin

## 2016-06-12 ENCOUNTER — Ambulatory Visit (HOSPITAL_BASED_OUTPATIENT_CLINIC_OR_DEPARTMENT_OTHER): Payer: Managed Care, Other (non HMO)

## 2016-06-12 ENCOUNTER — Other Ambulatory Visit: Payer: Self-pay | Admitting: *Deleted

## 2016-06-12 DIAGNOSIS — D509 Iron deficiency anemia, unspecified: Secondary | ICD-10-CM | POA: Diagnosis not present

## 2016-06-12 MED ORDER — SODIUM CHLORIDE 0.9 % IV SOLN
510.0000 mg | Freq: Once | INTRAVENOUS | Status: DC
  Administered 2016-06-12: 510 mg via INTRAVENOUS
  Filled 2016-06-12: qty 17

## 2016-06-12 NOTE — Patient Instructions (Signed)

## 2016-06-14 MED ORDER — ACETAMINOPHEN 325 MG PO TABS
ORAL_TABLET | ORAL | Status: AC
  Filled 2016-06-14: qty 2

## 2016-06-14 MED ORDER — LORAZEPAM 2 MG/ML IJ SOLN
INTRAMUSCULAR | Status: AC
  Filled 2016-06-14: qty 1

## 2016-06-14 MED ORDER — DIPHENHYDRAMINE HCL 25 MG PO CAPS
ORAL_CAPSULE | ORAL | Status: AC
  Filled 2016-06-14: qty 1

## 2016-06-19 ENCOUNTER — Other Ambulatory Visit: Payer: Self-pay | Admitting: Oncology

## 2016-06-19 ENCOUNTER — Ambulatory Visit (HOSPITAL_BASED_OUTPATIENT_CLINIC_OR_DEPARTMENT_OTHER): Payer: Managed Care, Other (non HMO)

## 2016-06-19 VITALS — BP 144/94 | HR 80 | Temp 98.4°F | Resp 18

## 2016-06-19 DIAGNOSIS — D508 Other iron deficiency anemias: Secondary | ICD-10-CM

## 2016-06-19 DIAGNOSIS — D509 Iron deficiency anemia, unspecified: Secondary | ICD-10-CM

## 2016-06-19 MED ORDER — SODIUM CHLORIDE 0.9 % IV SOLN
510.0000 mg | Freq: Once | INTRAVENOUS | Status: AC
  Administered 2016-06-19: 510 mg via INTRAVENOUS
  Filled 2016-06-19: qty 17

## 2016-06-19 MED ORDER — SODIUM CHLORIDE 0.9 % IV SOLN
INTRAVENOUS | Status: DC
  Administered 2016-06-19: 15:00:00 via INTRAVENOUS

## 2016-06-19 NOTE — Progress Notes (Signed)
Complaining of feeling out of sorts per patient, BP checked 165/110. Called Selena Lesser, NP and reported symptoms and blood pressure. Instructed to recheck and call back with BP in 15 minutes.  Rechecked BP manually - 144/94, results called to C. Berniece Salines, NP. Okay to be discharged home, patient instructed to recheck BP at home, document results and go to ED for continued elevation. Instructed to to follow up with primary MD for further problems.

## 2016-06-19 NOTE — Patient Instructions (Addendum)
Ferumoxytol injection What is this medicine? FERUMOXYTOL is an iron complex. Iron is used to make healthy red blood cells, which carry oxygen and nutrients throughout the body. This medicine is used to treat iron deficiency anemia in people with chronic kidney disease. This medicine may be used for other purposes; ask your health care provider or pharmacist if you have questions. What should I tell my health care provider before I take this medicine? They need to know if you have any of these conditions: -anemia not caused by low iron levels -high levels of iron in the blood -magnetic resonance imaging (MRI) test scheduled -an unusual or allergic reaction to iron, other medicines, foods, dyes, or preservatives -pregnant or trying to get pregnant -breast-feeding How should I use this medicine? This medicine is for injection into a vein. It is given by a health care professional in a hospital or clinic setting. Talk to your pediatrician regarding the use of this medicine in children. Special care may be needed. Overdosage: If you think you have taken too much of this medicine contact a poison control center or emergency room at once. NOTE: This medicine is only for you. Do not share this medicine with others. What if I miss a dose? It is important not to miss your dose. Call your doctor or health care professional if you are unable to keep an appointment. What may interact with this medicine? This medicine may interact with the following medications: -other iron products This list may not describe all possible interactions. Give your health care provider a list of all the medicines, herbs, non-prescription drugs, or dietary supplements you use. Also tell them if you smoke, drink alcohol, or use illegal drugs. Some items may interact with your medicine. What should I watch for while using this medicine? Visit your doctor or healthcare professional regularly. Tell your doctor or healthcare  professional if your symptoms do not start to get better or if they get worse. You may need blood work done while you are taking this medicine. You may need to follow a special diet. Talk to your doctor. Foods that contain iron include: whole grains/cereals, dried fruits, beans, or peas, leafy green vegetables, and organ meats (liver, kidney). What side effects may I notice from receiving this medicine? Side effects that you should report to your doctor or health care professional as soon as possible: -allergic reactions like skin rash, itching or hives, swelling of the face, lips, or tongue -breathing problems -changes in blood pressure -feeling faint or lightheaded, falls -fever or chills -flushing, sweating, or hot feelings -swelling of the ankles or feet Side effects that usually do not require medical attention (Report these to your doctor or health care professional if they continue or are bothersome.): -diarrhea -headache -nausea, vomiting -stomach pain This list may not describe all possible side effects. Call your doctor for medical advice about side effects. You may report side effects to FDA at 1-800-FDA-1088. Where should I keep my medicine? This drug is given in a hospital or clinic and will not be stored at home. NOTE: This sheet is a summary. It may not cover all possible information. If you have questions about this medicine, talk to your doctor, pharmacist, or health care provider.    2016, Elsevier/Gold Standard. (2012-03-13 15:23:36)   Recheck blood pressure at home and document results. If blood pressure goes higher and not lower go to the emergency room. Follow up with primary MD for continued elevation of blood pressure.

## 2016-07-01 ENCOUNTER — Ambulatory Visit (INDEPENDENT_AMBULATORY_CARE_PROVIDER_SITE_OTHER): Payer: Managed Care, Other (non HMO) | Admitting: Licensed Clinical Social Worker

## 2016-07-01 DIAGNOSIS — F319 Bipolar disorder, unspecified: Secondary | ICD-10-CM | POA: Diagnosis not present

## 2016-07-03 ENCOUNTER — Encounter (HOSPITAL_COMMUNITY): Payer: Self-pay | Admitting: Licensed Clinical Social Worker

## 2016-07-03 NOTE — Progress Notes (Signed)
Therapist Progress Note   Time: 3:10-4:00 pm  Participation Level: Active  Behavioral Response: Casual, Alert, Somewhat Anxious  Type of Therapy: Individual Therapy  Treatment Goals Addressed: Coping  Interventions: Supportive/ CBT  Summary: Pt presented for individual therapy. Pt has not been to therapy in 2 months. His daughter got married, his son punctured his lung with a paperclip and is in the hospital, awaiting stabilization to go to [place], and he has a new granddaughter. He was very proud of all the new accomplishments of most of his children.Pt was able to celebrate the positive things happening in his life. Pt still laments about his financial situation and how he thinks he pays the majority of the bills. He uses no boundaries with his children and then complains about how they take advantage of him. Pt was reminded of the use of his coping skills but admits he feels helpless and not heard. Once again reminded pt that 2 of his children may live with him indefinitely. He is not happy about that scenario but refuses to use boundaries with them. Discussed with pt if he chooses to not use boundaries then perhaps he needs to work on acceptance. Pt is not sure he is ready for acceptance at this point. He was receptive to suggestions during the intervention.  Suicidal/Homicidal: No  Therapist Response: Assessed pt's current functioning and reviewed progress. Assisted pt processing issues with family,  and processing for management of stressors.  Plan: Return in 2 weeks using CBT  Diagnosis: Axis 1: Bipolar 1 disorder,   [name], LCAS

## 2016-07-16 ENCOUNTER — Ambulatory Visit (HOSPITAL_COMMUNITY): Payer: Self-pay | Admitting: Licensed Clinical Social Worker

## 2016-07-17 ENCOUNTER — Telehealth: Payer: Self-pay | Admitting: Pulmonary Disease

## 2016-07-17 MED ORDER — AZITHROMYCIN 250 MG PO TABS: 250.0000 mg | ORAL_TABLET | Freq: Every day | ORAL | 0 refills | Status: DC

## 2016-07-17 MED ORDER — PREDNISONE 10 MG PO TABS: 40.0000 mg | ORAL_TABLET | Freq: Every day | ORAL | 0 refills | Status: DC

## 2016-07-17 NOTE — Telephone Encounter (Signed)
LM for patient stating we have sent Zpak and Prednisone 40 mg x 4 days have been sent to [facility].

## 2016-07-17 NOTE — Telephone Encounter (Signed)
Spoke with pt. States that he is having a flare up. Reports chest tightness, wheezing, coughing, SOB and fatigue. Cough is producing yellow mucus. Denies fever. Symptoms started last week while he was in [place], [place]. Would like JN's recommendations.  JN - please advise. Thanks.

## 2016-07-17 NOTE — Telephone Encounter (Signed)
Augmentin is too narrow coverage. Please put in a Zpack. Thanks.

## 2016-07-17 NOTE — Telephone Encounter (Signed)
Spoke with patient Alex [name] of recs from JN-states he can not tolerate Doxy  Wants Augmentin XR -has had in past and tolerates better OV on 07-23-16 at 11:45am.   JN Please advise.

## 2016-07-17 NOTE — Telephone Encounter (Signed)
Make sure he is taking his Mucinex twice daily. He should use his Albuterol inhaler 2 puffs 4 times a day while he is sick. Order a sputum culture for AFB,Fungus, & Bacteria. Send in a prescription for Doxycycline 100mg  po bid x7 days. Send in a prescription for Prednisone 40mg  daily x 4 days. He should be seen next week in our office by either myself or whomever is available. Thanks.

## 2016-07-18 ENCOUNTER — Other Ambulatory Visit: Payer: Managed Care, Other (non HMO)

## 2016-07-18 ENCOUNTER — Other Ambulatory Visit: Payer: Self-pay | Admitting: Pulmonary Disease

## 2016-07-18 DIAGNOSIS — R059 Cough, unspecified: Secondary | ICD-10-CM

## 2016-07-18 DIAGNOSIS — R05 Cough: Secondary | ICD-10-CM

## 2016-07-19 ENCOUNTER — Encounter: Payer: Self-pay | Admitting: Internal Medicine

## 2016-07-19 ENCOUNTER — Ambulatory Visit (INDEPENDENT_AMBULATORY_CARE_PROVIDER_SITE_OTHER): Payer: Managed Care, Other (non HMO) | Admitting: Internal Medicine

## 2016-07-19 DIAGNOSIS — F329 Major depressive disorder, single episode, unspecified: Secondary | ICD-10-CM

## 2016-07-19 DIAGNOSIS — Z0000 Encounter for general adult medical examination without abnormal findings: Secondary | ICD-10-CM

## 2016-07-19 DIAGNOSIS — F411 Generalized anxiety disorder: Secondary | ICD-10-CM

## 2016-07-19 DIAGNOSIS — J479 Bronchiectasis, uncomplicated: Secondary | ICD-10-CM

## 2016-07-19 DIAGNOSIS — N529 Male erectile dysfunction, unspecified: Secondary | ICD-10-CM

## 2016-07-19 MED ORDER — PROMETHAZINE-CODEINE 6.25-10 MG/5ML PO SYRP: 5.0000 mL | ORAL_SOLUTION | ORAL | 0 refills | Status: DC | PRN

## 2016-07-19 MED ORDER — TADALAFIL 20 MG PO TABS: 20.0000 mg | ORAL_TABLET | Freq: Every day | ORAL | 3 refills | Status: DC | PRN

## 2016-07-19 NOTE — Assessment & Plan Note (Signed)
Lorazepam prn  Potential benefits of a long term benzodiazepines  use as well as potential risks  and complications were explained to the patient and were aknowledged.  

## 2016-07-19 NOTE — Assessment & Plan Note (Signed)
Prom-Cod syr 

## 2016-07-19 NOTE — Patient Instructions (Signed)
GERD wedge 

## 2016-07-19 NOTE — Assessment & Plan Note (Signed)
We discussed age appropriate health related issues, including available/recomended screening tests and vaccinations. We discussed a need for adhering to healthy diet and exercise. Labs were ordered on a Rx blank. All questions were answered.

## 2016-07-19 NOTE — Progress Notes (Signed)
Pre visit review using our clinic review tool, if applicable. No additional management support is needed unless otherwise documented below in the visit note. 

## 2016-07-19 NOTE — Assessment & Plan Note (Signed)
Cialis prn 

## 2016-07-19 NOTE — Progress Notes (Addendum)
Subjective:  Patient ID: Alex [name], male    DOB: [birth year]  Age: 60 y.o. MRN: [id number]  CC: No chief complaint on file.   HPI Davidlee Beare Blash presents for a well exam C/o stress w/Justin. C/o cough - better   Cough due to bronchiectases is better  Outpatient Medications Prior to Visit  Medication Sig Dispense Refill  . albuterol (PROVENTIL HFA;VENTOLIN HFA) 108 (90 Base) MCG/ACT inhaler Inhale 2 puffs into the lungs every 4 (four) hours as needed for wheezing or shortness of breath. 1 Inhaler 3  . ALPHAGAN P 0.1 % SOLN Place 1 drop into both eyes 2 (two) times daily.     [name] aspirin EC 81 MG tablet Take 1 tablet by mouth daily.    [name] atorvastatin (LIPITOR) 40 MG tablet TAKE 1 TABLET (40 MG TOTAL) BY MOUTH DAILY. 90 tablet 3  . AXIRON 30 MG/ACT SOLN Place 2 Act onto the skin daily.    [name] azithromycin (ZITHROMAX) 250 MG tablet Take 1 tablet (250 mg total) by mouth daily. 6 tablet 0  . benzonatate (TESSALON) 100 MG capsule Take 1-2 capsules (100-200 mg total) by mouth 3 (three) times daily as needed for cough. 90 capsule 1  . Coenzyme Q10 (CO Q 10 PO) Take by mouth daily.    [name] dicyclomine (BENTYL) 20 MG tablet TAKE 1 TABLET 3 TIMES A DAY AS NEEDED  6  . fluconazole (DIFLUCAN) 150 MG tablet Take 1 tablet (150 mg total) by mouth once. Reported on 08/04/2015 4 tablet 2  . Immune Globulin, Human, (HIZENTRA) 4 GM/20ML SOLN Inject into the skin once a week. wednesday    . LamoTRIgine 300 MG TB24 Take 1 tablet (300 mg total) by mouth daily. 90 tablet 0  . lidocaine-prilocaine (EMLA) cream as needed.    [name] LORazepam (ATIVAN) 0.5 MG tablet Take 1 tab daily as needed and 1 tab at bed time 45 tablet 2  . LUTEIN PO Take by mouth daily.    . OMEGA-3 KRILL OIL PO Take by mouth daily.    . predniSONE (DELTASONE) 10 MG tablet Take 4 tablets (40 mg total) by mouth daily with breakfast. 16 tablet 0  . predniSONE (DELTASONE) 20 MG tablet Take 2 tablets (40 mg total) by mouth daily with breakfast. 8  tablet 0  . promethazine-codeine (PHENERGAN WITH CODEINE) 6.25-10 MG/5ML syrup Take 5 mLs by mouth every 4 (four) hours as needed. 300 mL 0  . RABEprazole (ACIPHEX) 20 MG tablet TAKE 1 TABLET BY MOUTH EVERY DAY--  takes in pm    . Respiratory Therapy Supplies (FLUTTER) DEVI Use as directed 1 each 0  . Saw Palmetto, Serenoa repens, (SAW PALMETTO PO) Take by mouth.    . sucralfate (CARAFATE) 1 G tablet Take 1 g by mouth as needed.   3  . SYNTHROID 150 MCG tablet Take 1 tablet (150 mcg total) by mouth daily. Brand name only please. Dispense as written 1 90 tablet 3  . telmisartan (MICARDIS) 80 MG tablet Take 1 tablet (80 mg total) by mouth daily. 90 tablet 3  . zolpidem (AMBIEN) 10 MG tablet Take 1 tablet (10 mg total) by mouth at bedtime as needed. for sleep 30 tablet 2  . doxycycline (VIBRA-TABS) 100 MG tablet Take 1 tablet (100 mg total) by mouth 2 (two) times daily. 14 tablet 0  . fluticasone furoate-vilanterol (BREO ELLIPTA) 100-25 MCG/INH AEPB Inhale 1 puff into the lungs daily. (Patient not taking: Reported on 07/19/2016) 1 each 3  No facility-administered medications prior to visit.     ROS Review of Systems  Constitutional: Positive for fatigue. Negative for appetite change and unexpected weight change.  HENT: Negative for congestion, nosebleeds, sneezing, sore throat and trouble swallowing.   Eyes: Positive for visual disturbance. Negative for itching.  Respiratory: Positive for cough and shortness of breath.   Cardiovascular: Negative for chest pain, palpitations and leg swelling.  Gastrointestinal: Negative for abdominal distention, blood in stool, diarrhea and nausea.  Genitourinary: Positive for frequency and urgency. Negative for hematuria.  Musculoskeletal: Positive for arthralgias. Negative for back pain, gait problem, joint swelling and neck pain.  Skin: Negative for rash.  Neurological: Positive for numbness. Negative for dizziness, tremors, speech difficulty and weakness.    Psychiatric/Behavioral: Positive for decreased concentration and dysphoric mood. Negative for agitation, sleep disturbance and suicidal ideas. The patient is nervous/anxious.     Objective:  BP 130/80   Pulse 83   Temp 98.2 F (36.8 C) (Oral)   Ht 6\' 1"  (1.854 m)   Wt 185 lb (83.9 kg)   SpO2 96%   BMI 24.41 kg/m   BP Readings from Last 3 Encounters:  07/19/16 130/80  06/19/16 (!) 144/94  06/12/16 (!) 133/96    Wt Readings from Last 3 Encounters:  07/19/16 185 lb (83.9 kg)  04/23/16 178 lb 9.6 oz (81 kg)  03/21/16 180 lb 8 oz (81.9 kg)    Physical Exam  Constitutional: He is oriented to person, place, and time. He appears well-developed and well-nourished. No distress.  HENT:  Head: Normocephalic and atraumatic.  Right Ear: External ear normal.  Left Ear: External ear normal.  Nose: Nose normal.  Mouth/Throat: Oropharynx is clear and moist. No oropharyngeal exudate.  Eyes: Conjunctivae and EOM are normal. Pupils are equal, round, and reactive to light. Right eye exhibits no discharge. Left eye exhibits no discharge. No scleral icterus.  Neck: Normal range of motion. Neck supple. No JVD present. No tracheal deviation present. No thyromegaly present.  Cardiovascular: Normal rate, regular rhythm, normal heart sounds and intact distal pulses.  Exam reveals no gallop and no friction rub.   No murmur heard. Pulmonary/Chest: Effort normal and breath sounds normal. No stridor. No respiratory distress. He has no wheezes. He has no rales. He exhibits no tenderness.  Abdominal: Soft. Bowel sounds are normal. He exhibits no distension and no mass. There is no tenderness. There is no rebound and no guarding.  Musculoskeletal: Normal range of motion. He exhibits no edema or tenderness.  Lymphadenopathy:    He has no cervical adenopathy.  Neurological: He is alert and oriented to person, place, and time. He has normal reflexes. No cranial nerve deficit. He exhibits normal muscle tone.  Coordination normal.  Skin: Skin is warm and dry. No rash noted. He is not diaphoretic. No erythema. No pallor.  Psychiatric: His behavior is normal. Judgment and thought content normal.  rectal per urology Anxious Skin w/reconstruction changes in the upper half of the face  Lab Results  Component Value Date   WBC 11.9 (H) 08/01/2015   HGB 16.4 08/01/2015   HCT 51.0 08/01/2015   PLT 344.0 08/01/2015   GLUCOSE 131 (H) 02/02/2016   CHOL 170 02/02/2016   TRIG 420.0 (H) 02/02/2016   HDL 73.30 02/02/2016   LDLDIRECT 59.0 02/02/2016   LDLCALC 86 08/01/2015   ALT 32 02/02/2016   AST 19 02/02/2016   NA 137 02/02/2016   K 4.1 02/02/2016   CL 101 02/02/2016   CREATININE  1.08 02/02/2016   BUN 24 (H) 02/02/2016   CO2 29 02/02/2016   TSH 4.14 08/01/2015   PSA 1.58 02/02/2016   HGBA1C 6.1 08/04/2012    Mr Ankle Left  Wo Contrast  Result Date: 04/08/2016 CLINICAL DATA:  Worsening left ankle pain, stiffness and swelling over the past 3 months. Symptoms are worst laterally. No known injury. EXAM: MRI OF THE LEFT ANKLE WITHOUT CONTRAST TECHNIQUE: Multiplanar, multisequence MR imaging of the ankle was performed. No intravenous contrast was administered. COMPARISON:  None. FINDINGS: TENDONS Peroneal: Intact. Posteromedial: Intact. Anterior: Intact. Achilles: Intact. Plantar Fascia: Intact. LIGAMENTS Lateral: Intact. Medial: Intact. CARTILAGE Ankle Joint: Unremarkable. Subtalar Joints/Sinus Tarsi: Appear normal. Bones: No acute bony or joint abnormality is identified. Small bone fragment without edema is seen off the anterior aspect of the medial malleolus which may be due to old injury. Other: There is some subcutaneous edema about the ankle which appears worse on the medial side. IMPRESSION: No acute abnormality.  The subtalar joint appears normal. Small bone fragment off the tip of the medial malleolus may be due to old trauma. Mild subcutaneous edema about the ankle is worst medially. Electronically  Signed   By: Inge Rise [name]   On: 04/08/2016 09:28    Assessment & Plan:   There are no diagnoses linked to this encounter. I have discontinued Mr. Daigre doxycycline. I am also having him maintain his Immune Globulin (Human), Coenzyme Q10 (CO Q 10 PO), OMEGA-3 KRILL OIL PO, RABEprazole, sucralfate, AXIRON, ALPHAGAN P, lidocaine-prilocaine, aspirin EC, atorvastatin, dicyclomine, fluticasone furoate-vilanterol, promethazine-codeine, fluconazole, SYNTHROID, telmisartan, benzonatate, (Saw Palmetto, Serenoa repens, (SAW PALMETTO PO)), LUTEIN PO, FLUTTER, albuterol, LORazepam, LamoTRIgine, zolpidem, predniSONE, azithromycin, and predniSONE.  No orders of the defined types were placed in this encounter.    Follow-up: No Follow-up on file.  Walker Kehr, MD

## 2016-07-19 NOTE — Assessment & Plan Note (Signed)
Worse F/u w/Dr Adele Schilder

## 2016-07-21 LAB — RESPIRATORY CULTURE OR RESPIRATORY AND SPUTUM CULTURE

## 2016-07-23 ENCOUNTER — Ambulatory Visit (INDEPENDENT_AMBULATORY_CARE_PROVIDER_SITE_OTHER): Payer: Managed Care, Other (non HMO) | Admitting: Pulmonary Disease

## 2016-07-23 ENCOUNTER — Ambulatory Visit (HOSPITAL_COMMUNITY): Payer: Self-pay | Admitting: Psychiatry

## 2016-07-23 ENCOUNTER — Encounter: Payer: Self-pay | Admitting: Pulmonary Disease

## 2016-07-23 ENCOUNTER — Other Ambulatory Visit: Payer: Self-pay | Admitting: Internal Medicine

## 2016-07-23 VITALS — BP 130/82 | HR 75 | Ht 73.0 in | Wt 183.0 lb

## 2016-07-23 DIAGNOSIS — I712 Thoracic aortic aneurysm, without rupture, unspecified: Secondary | ICD-10-CM

## 2016-07-23 DIAGNOSIS — J471 Bronchiectasis with (acute) exacerbation: Secondary | ICD-10-CM | POA: Diagnosis not present

## 2016-07-23 DIAGNOSIS — J410 Simple chronic bronchitis: Secondary | ICD-10-CM | POA: Diagnosis not present

## 2016-07-23 DIAGNOSIS — I7121 Aneurysm of the ascending aorta, without rupture: Secondary | ICD-10-CM | POA: Insufficient documentation

## 2016-07-23 LAB — CBC
HCT: 47.9 % (ref 38.5–50.0)
Hemoglobin: 16.3 g/dL (ref 13.2–17.1)
MCH: 27.1 pg (ref 27.0–33.0)
MCHC: 34 g/dL (ref 32.0–36.0)
MCV: 79.6 fL — ABNORMAL LOW (ref 80.0–100.0)
MPV: 8.3 fL (ref 7.5–12.5)
Platelets: 210 10*3/uL (ref 140–400)
RBC: 6.02 MIL/uL — ABNORMAL HIGH (ref 4.20–5.80)
RDW: 18.4 % — ABNORMAL HIGH (ref 11.0–15.0)
WBC: 11.9 10*3/uL — ABNORMAL HIGH (ref 3.8–10.8)

## 2016-07-23 LAB — PSA: PSA: 1.5 ng/mL (ref <=4.0)

## 2016-07-23 LAB — TSH: TSH: 3.5 mIU/L (ref 0.40–4.50)

## 2016-07-23 MED ORDER — AEROCHAMBER MV MISC: 0 refills | Status: DC

## 2016-07-23 MED ORDER — BUDESONIDE-FORMOTEROL FUMARATE 80-4.5 MCG/ACT IN AERO: 2.0000 | INHALATION_SPRAY | Freq: Two times a day (BID) | RESPIRATORY_TRACT | 0 refills | Status: DC

## 2016-07-23 NOTE — Progress Notes (Signed)
Subjective:    Patient ID: Alex [name], male    DOB: 11/30/1955, 60 y.o.   MRN: [id number]  C.C.:  Follow-up for Chronic Bronchitis/Mild Bronchiectasis, GERD, & CVID.  HPI Chronic Bronchitis/Mild Bronchiectasis:  Prescribed a Z-Pak & Prednisone for 4 days after phone call on 12/6 with productive cough. Sputum culture with abundant Moraxella catarrhalis. He has had a couple of flare since his last appointment. Cough remains productive somewhat and he does wake up at night wheezing at times as well. He reports the Proventil inhaler does seem to help his wheezing. He reports he is only using his flutter valve intermittently. He is staying on Mucinex daily.   GERD:  Previously on Aciphex & follows with GI Oletta Lamas). Reports he has had to switch over to OTC Prilosec with reasonable control. No morning brash water taste. No dysphagia.   CVID:  Follows with allergy. Currently on immune replacement.   Review of Systems  He reports his sinus congestion has improved as has his sinus drainage. No fever, chills, or sweats. No nausea or emesis.   Allergies  Allergen Reactions  . Percocet [Oxycodone-Acetaminophen] Itching    Can take generic.  States only has a problem with percocet brand    Current Outpatient Prescriptions on File Prior to Visit  Medication Sig Dispense Refill  . albuterol (PROVENTIL HFA;VENTOLIN HFA) 108 (90 Base) MCG/ACT inhaler Inhale 2 puffs into the lungs every 4 (four) hours as needed for wheezing or shortness of breath. 1 Inhaler 3  . ALPHAGAN P 0.1 % SOLN Place 1 drop into both eyes 2 (two) times daily.     [name] aspirin EC 81 MG tablet Take 1 tablet by mouth daily.    [name] atorvastatin (LIPITOR) 40 MG tablet TAKE 1 TABLET (40 MG TOTAL) BY MOUTH DAILY. 90 tablet 3  . AXIRON 30 MG/ACT SOLN Place 2 Act onto the skin daily.    [name] azithromycin (ZITHROMAX) 250 MG tablet Take 1 tablet (250 mg total) by mouth daily. 6 tablet 0  . benzonatate (TESSALON) 100 MG capsule Take 1-2  capsules (100-200 mg total) by mouth 3 (three) times daily as needed for cough. 90 capsule 1  . Coenzyme Q10 (CO Q 10 PO) Take by mouth daily.    [name] dicyclomine (BENTYL) 20 MG tablet TAKE 1 TABLET 3 TIMES A DAY AS NEEDED  6  . fluconazole (DIFLUCAN) 150 MG tablet Take 1 tablet (150 mg total) by mouth once. Reported on 08/04/2015 4 tablet 2  . fluticasone furoate-vilanterol (BREO ELLIPTA) 100-25 MCG/INH AEPB Inhale 1 puff into the lungs daily. 1 each 3  . Immune Globulin, Human, (HIZENTRA) 4 GM/20ML SOLN Inject into the skin once a week. wednesday    . LamoTRIgine 300 MG TB24 Take 1 tablet (300 mg total) by mouth daily. 90 tablet 0  . lidocaine-prilocaine (EMLA) cream as needed.    [name] LORazepam (ATIVAN) 0.5 MG tablet Take 1 tab daily as needed and 1 tab at bed time 45 tablet 2  . LUTEIN PO Take by mouth daily.    . OMEGA-3 KRILL OIL PO Take by mouth daily.    . predniSONE (DELTASONE) 10 MG tablet Take 4 tablets (40 mg total) by mouth daily with breakfast. 16 tablet 0  . promethazine-codeine (PHENERGAN WITH CODEINE) 6.25-10 MG/5ML syrup Take 5 mLs by mouth every 4 (four) hours as needed. 300 mL 0  . RABEprazole (ACIPHEX) 20 MG tablet TAKE 1 TABLET BY MOUTH EVERY DAY--  takes in pm    .  Respiratory Therapy Supplies (FLUTTER) DEVI Use as directed 1 each 0  . Saw Palmetto, Serenoa repens, (SAW PALMETTO PO) Take by mouth.    . sucralfate (CARAFATE) 1 G tablet Take 1 g by mouth as needed.   3  . SYNTHROID 150 MCG tablet Take 1 tablet (150 mcg total) by mouth daily. Brand name only please. Dispense as written 1 90 tablet 3  . tadalafil (CIALIS) 20 MG tablet Take 1 tablet (20 mg total) by mouth daily as needed for erectile dysfunction. 30 tablet 3  . telmisartan (MICARDIS) 80 MG tablet Take 1 tablet (80 mg total) by mouth daily. 90 tablet 3  . zolpidem (AMBIEN) 10 MG tablet Take 1 tablet (10 mg total) by mouth at bedtime as needed. for sleep 30 tablet 2  . predniSONE (DELTASONE) 20 MG tablet Take 2  tablets (40 mg total) by mouth daily with breakfast. (Patient not taking: Reported on 07/23/2016) 8 tablet 0   No current facility-administered medications on file prior to visit.     Past Medical History:  Diagnosis Date  . Anxiety   . At risk for sleep apnea    STOP-BANG= 5    SENT TO PCP 04-22-2014  . Bipolar I disorder ([place])   . Bleeding ulcer 06/2014  . BPH (benign prostatic hypertrophy) with urinary obstruction   . Chronic back pain   . Complication of anesthesia POST URINARY RETENTION---  2006 SHOULDER SURGERY MARKED BRADYCARDIA VAGAL RESPONSE NO ISSUE W/ SURGERY AFTER THIS ONE  . Coronary atherosclerosis CARDIOLOGIST- DR CRENSHAW--  LAST VISIT 01-05-2012 IN [facility]   NON-OBSTRUCTIVE MILD DISEASE  . 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 Depression   . Epicondylitis    right elbow  . GERD (gastroesophageal reflux disease)   . Glaucoma BOTH EYES -- NO DROPS   . History of chronic prostatitis   . 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 History of thyroid cancer PRIMARY (NO METS FROM ORBITAL CANCER)--   IN REMISSION   S/P TOTAL THYROIDECTOMY  , CHEMORADIATION  (ONCOLOGIST -- DR Griffith Citron)  . Hyperlipidemia   . Hypertension   . Immune deficiency disorder ([place])    COMMON VARIABLE  . Nocturia   . OA (osteoarthritis)   . Positional vertigo   . Renal calculi LEFT KIDNEY-- NON-OBSTRUCTIVE  . Ulnar nerve compression    right elbow  . Urinary hesitancy     Past Surgical History:  Procedure Laterality Date  . CARDIAC CATHETERIZATION  01-16-2006  DR Marcello Moores WALL   MILD CORONARY ATHEROSCLEROSIS/ MID TO DISTAL LAD 40% STENOSIS/ LVF 50-55%  . KNEE ARTHROSCOPY  05/01/2012   Procedure: ARTHROSCOPY KNEE;  Surgeon: Johnn Hai, MD;  Location: [facility];  Service: Orthopedics;  Laterality: Left;   debridement and removal of loose body  . LEFT ANKLE ARTHROSCOPY W/ DEBRIDEMENT  05-12-2007  . LEFT HYDROCELECTOMY  03-29-2005   AND REPAIR LEFT INGUINAL HERNIA W/ MESH  . MOHS SURGERY  2002   RIGHT ORBITAL CANCER  . NASAL ENDOSCOPY  08-07-2005   RIGHT EPISTAXIS  / POST SEPTOPLASTY  (HX RIGHT ORBITAL CA & S/P RADIATION/ NECROSIS ANTERIOR END OF BOTH INFERIOR TURBINATES)  . occuloplastic surgery  2002  . PARS PLANA VITRECTOMY  11-06-2004   RIGHT EYE  RADIATION RETINOPATHY W/ HEMORRHAGE  . REPAIR UNDESENDED RIGHT TESTICLE / RIGHT INGUINAL HERNIA  AGE 13  . RIGHT ANKLE ARTHROSCOPY W/ EXTENSIVE DEBRIDEMENT  04-05-2008   . RIGHT SUPRAOMOHYOID NECK DISSECTION   03-08-2003   ZONES 1,2,3;   SUBMANDIBULAR MASS / METASTATIC SQUAMOUS CELL CARCINOMA RIGHT NECK  . SEPTOPLASTY  NOV 2006  . SHOULDER ARTHROSCOPY W/ SUBACROMIAL DECOMPRESSION AND DISTAL CLAVICLE EXCISION  10-09-2008   AND DEBRIDEMENT OF RIGHT SHOULDER IMPINGEMENT & [facility] JOINT ARTHRITIS  . SPINE SURGERY    . TOTAL THYROIDECTOMY  11-03-2001   PAPILLARY THYROID CARCINOMA  . TRANSTHORACIC ECHOCARDIOGRAM  12/ 2012   grade I diastolic dysfunction/ ef 0000000  . ULNAR NERVE TRANSPOSITION Right 04/28/2014   Procedure: RIGHT ELBOW ULNA NERVE RELEASE TRANSPOSTION AND MEDIAL EPICONDYLAR DEBRIDEMENT AND REPAIR;  Surgeon: Linna Hoff, MD;  Location: [place];  Service: Orthopedics;  Laterality: Right;    Family History  Problem Relation Age of Onset  . Depression Sister   . Rectal cancer Sister   . Lung cancer Brother   . Kidney disease Mother   . Depression Mother   . Stroke Father   . COPD Father   . Hypertension Father   . Prostate cancer Father   . Depression Daughter   . Drug abuse Daughter   . Psychiatric Illness Son     Social History   Social History  . Marital status: Married    Spouse name: N/A  . Number of children: N/A  . Years of education: N/A   Social History Main Topics  . Smoking status: Never  Smoker  . Smokeless tobacco: Never Used  . Alcohol use 0.0 oz/week     Comment: 1-2 glasses per month  . Drug use: No  . Sexual activity: Not Asked   Other Topics Concern  . None   Social History Narrative   Originally from [place]. Moved to [place] in 1984. 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 No international travel other than [place]. Has a dog at home, poodle mix. His daughter who lives with him now has a dog. No mold exposure. No bird exposure. No hot tub exposure. Enjoys gardening.       Objective:   Physical Exam BP 130/82 (BP Location: Left Arm, Cuff Size: Normal)   Pulse 75   Ht 6\' 1"  (1.854 m)   Wt 183 lb (83 kg)   SpO2 95%   BMI 24.14 kg/m  General:  Awake. Comfortable. No distress. Integument:  Warm & dry. No rash on exposed skin. Lymphatics:  No appreciated cervical or supraclavicular lymphadenoapthy. HEENT:  Moist mucus membranes. Bilateral scleral injection. No oral ulcers. Cardiovascular:  Regular rate. No edema. Normal S1 & S2. Pulmonary:  Good aeration bilaterally. Minimal nonproductive cough. Clear on auscultation. Abdomen: Soft. Normal bowel sounds. Nontender.  PFT 04/22/16: FVC 5.09 L (103%) FEV1 3.95 L (106%) FEV1/FVC 0.78 FEF 25-75 3.47 L (116%) negative bronchodilator response TLC 7.50 L (103%) RV 112% ERV 98% DLCO corrected 99% (Hgb 15.0)  6MWT 04/22/16:  Walked 483 meters / Baseline Sat 98% on RA / Nadir Sat 97% on RA  IMAGING BARIUM SWALLOW 03/19/16 (per radiologist): Narrowed lower cervical esophagus possibly due to prior radiation treatment as well as prominent cricopharyngeus muscle. Tiny sliding-type hiatal hernia. Moderate gastroesophageal reflux. Moderate tertiary contractions.  HRCT CHEST 03/19/16 (previously reviewed  by me): No lung opacity or intralobular septal thickening to suggest interstitial lung disease.  Mild bronchiectasis noted. No pulmonary nodule or mass appreciated. No pleural effusion or thickening. No pathologic mediastinal adenopathy. No pericardial effusion.  PORT CXR 03/03/16 (per radiologist): Heart normal in size. Lungs clear. No acute disease.  CXR PA/LAT 11/10/15 (previously reviewed by me): No focal opacity or mass appreciated. No pleural effusion. Heart normal in size & mediastinum normal in contour.  MICROBIOLOGY Sputum Ctx (07/18/16):  Abundant Moraxella catarrhalis / AFB pending / Fungus pending  Sputum Ctx (04/04/16):  Oral Flora / Light yeast  Sputum Ctx (03/12/16):  AFB negative  LABS 04/19/16 CBC: 13.3/16.5/48.2/224 Ferritin: 26  03/03/16 CBC: 10.1/15.5/46.0/288 BMP: 140/4.1/107/25/24/1.1/105/8.7  3/10/8 RF:  <20.0    Assessment & Plan:  60 y.o. male with chronic bronchitis with mild bronchiectasis along with common variable immunodeficiency. Patient treated on 12/6 for acute exacerbation with M. catarrhalis with Azithromycin. Patient seems to be recovering well. We did discuss the need for proper airway clearance to prevent further exacerbations. Given his response to oral steroids I feel that a low-dose inhaled steroid may help to prevent exacerbations. His reflux seems to be reasonably well-controlled at this time. We did discuss his ascending thoracic aortic aneurysm which measures 4.1 cm. He will likely need continued chest imaging to follow this. He is continuing on immunoglobulin replacement for his immune deficiency. I instructed the patient to contact my office if he had any new breathing problems or questions before his next appointment.  1. Chronic Bronchitis/Mild Bronchiectasis with mild exacerbation: Patient has finished course of prednisone and azithromycin. Patient encouraged to use his flutter valve/a cappella twice daily for clearance. Holding on escalating to nebulized hypertonic saline but we did discuss this possible future transition. Starting patient on  Symbicort 80/4.5 one puff twice daily with a spacer. Advised the patient he could escalate to 2 puffs twice daily but also instructed him on proper oral hygiene. 2. GERD:  Continuing to follow with GI. Continuing over-the-counter Prilosec. 3. Ascending Thoracic Aortic Aneurysm:  Referring to thoracic surgery for evaluation. Likely will need yearly chest CT imaging to trend.  4. CVID: Following with allergist for immunoglobulin replacement.  5. Health Maintenance:  S/P Influenza September 2017, Prevnar April 2015 & Pneumovax 03 June 2012.  6. Follow-up: Patient to return to clinic in 3 months or sooner if needed.  Sonia Baller Ashok Cordia, [name] [facility] Pager:  [phone] After 3pm or if no response, call [phone] 12:24 PM 07/23/16

## 2016-07-23 NOTE — Patient Instructions (Signed)
   Please try to remember to use your Flutter Valve/Acapella twice daily to help keep mucus from building up in your lungs.  I want you to use your Symbicort inhaler 1 puff twice daily with your spacer. If you don't have any problems with the medication you can go up to 2 puffs twice daily with your spacer. Remember to rinse, gargle, spit, brush your teeth, & brush your tongue to keep from getting thrush.  Call me for a prescription if your insurance will cover the Symbicort and you feel it is helping.  I am referring you to a thoracic surgeon to following you for your thoracic aortic aneurysm.   I will see you back in 63months or sooner if needed.

## 2016-07-24 LAB — COMPREHENSIVE METABOLIC PANEL
ALT: 40 U/L (ref 9–46)
AST: 24 U/L (ref 10–35)
Albumin: 4.1 g/dL (ref 3.6–5.1)
Alkaline Phosphatase: 62 U/L (ref 40–115)
BUN: 20 mg/dL (ref 7–25)
CO2: 27 mmol/L (ref 20–31)
Calcium: 8.7 mg/dL (ref 8.6–10.3)
Chloride: 101 mmol/L (ref 98–110)
Creat: 1.03 mg/dL (ref 0.70–1.25)
Glucose, Bld: 82 mg/dL (ref 65–99)
Potassium: 3.7 mmol/L (ref 3.5–5.3)
Sodium: 139 mmol/L (ref 135–146)
Total Bilirubin: 0.8 mg/dL (ref 0.2–1.2)
Total Protein: 6.4 g/dL (ref 6.1–8.1)

## 2016-07-24 LAB — LIPID PANEL
Cholesterol: 151 mg/dL (ref <200)
HDL: 74 mg/dL (ref >40)
LDL Cholesterol: 42 mg/dL (ref <100)
Total CHOL/HDL Ratio: 2 Ratio (ref <5.0)
Triglycerides: 175 mg/dL — ABNORMAL HIGH (ref <150)
VLDL: 35 mg/dL — ABNORMAL HIGH (ref <30)

## 2016-07-24 LAB — TESTOSTERONE: Testosterone: 327 ng/dL (ref 250–827)

## 2016-07-24 LAB — URINALYSIS, ROUTINE W REFLEX MICROSCOPIC
Bilirubin Urine: NEGATIVE
Glucose, UA: NEGATIVE
Hgb urine dipstick: NEGATIVE
Ketones, ur: NEGATIVE
Leukocytes, UA: NEGATIVE
Nitrite: NEGATIVE
Protein, ur: NEGATIVE
Specific Gravity, Urine: 1.02 (ref 1.001–1.035)
pH: 6 (ref 5.0–8.0)

## 2016-07-24 LAB — IRON AND TIBC
%SAT: 48 % (ref 15–60)
Iron: 171 ug/dL (ref 50–180)
TIBC: 354 ug/dL (ref 250–425)
UIBC: 183 ug/dL (ref 125–400)

## 2016-07-25 ENCOUNTER — Telehealth: Payer: Self-pay | Admitting: Pulmonary Disease

## 2016-07-25 MED ORDER — AMOXICILLIN-POT CLAVULANATE ER 1000-62.5 MG PO TB12: 1.0000 | ORAL_TABLET | Freq: Two times a day (BID) | ORAL | 0 refills | Status: DC

## 2016-07-25 NOTE — Telephone Encounter (Signed)
Go ahead and send in a prescription for Augmentin 875mg  po bid x7 days. Thanks.

## 2016-07-25 NOTE — Telephone Encounter (Signed)
Pt c/o URI not improving.  Seems to be tolerating the inhaler given last OV very well.  Pt states that the mucus is loosening up.  Pt is rinsing his mouth after every use of the inhalers.  Pt having ringing in ears, productive cough, runny nose, sore throat and an achy chest.  Feels he may have a touch of a sinus infection.  Pt requesting some additional abx be sent to [facility] - Pt requests Augmentin XR if Dr Ashok Cordia feels this would help his URI. Pt states that in the past he has not had any GI issues with this medication.   Please advise Dr Ashok Cordia. Thanks.

## 2016-07-25 NOTE — Telephone Encounter (Signed)
Called and spoke with pt and he is aware of [facility] recs.  Pt requested to have the augmentin 875 XR since this is easier on his stomach.  Per JN--ok to change to the augmentin er 1000/62.5  1 po every 12 hours x 7 days.  This has been sent to the pharmacy and nothing further is needed.

## 2016-07-30 ENCOUNTER — Encounter (HOSPITAL_COMMUNITY): Payer: Self-pay | Admitting: Licensed Clinical Social Worker

## 2016-07-30 ENCOUNTER — Ambulatory Visit (HOSPITAL_COMMUNITY): Payer: Managed Care, Other (non HMO) | Admitting: Licensed Clinical Social Worker

## 2016-07-30 DIAGNOSIS — F319 Bipolar disorder, unspecified: Secondary | ICD-10-CM

## 2016-07-30 NOTE — Progress Notes (Signed)
Therapist Progress Note   Time: 2:10-3:00 pm  Participation Level: Active  Behavioral Response: Casual, Alert, Anxious  Type of Therapy: Individual Therapy  Treatment Goals Addressed: Coping  Interventions: Supportive/ CBT  Summary: Pt presented for individual therapy. Pt seemed more anxious today. He has just returned from a 8 day visit to [facility] to see his new grandaughter and to help his daughter out. He didn't feel well much of the time he was there so had little contact with the baby. He said he and his wife got along pretty well while they were gone. Pt ruminates about his son and daughter who may never live independently of the home. He does not feel proud of any of his accomplishments and  Feels rejected by his family much of the time. Pt is not willing to change anything nor use boundaries with his family. Asked pt if he wants to continue in therapy as there is no problem solving, modeling nor any change. Pt reports he wants to continue in therapy and just wants someone to hear how he is feeling and to be able to vent. Have worked with pt on using boundaries with his family but he chooses not to. Discussed with pt if he chooses to not use boundaries then perhaps he needs to work on acceptance. Pt is not sure he is ready for acceptance at this point. He was receptive to suggestions during the intervention and wished to continue in therapy.  Suicidal/Homicidal: No  Therapist Response: Assessed pt's current functioning and reviewed progress. Assisted pt processing issues with family,  and processing for management of stressors.  Plan: Return in 2 weeks using CBT  Diagnosis: Axis 1: Bipolar 1 disorder,   [name], LCAS

## 2016-08-14 ENCOUNTER — Encounter: Payer: Self-pay | Admitting: Surgery

## 2016-08-14 ENCOUNTER — Institutional Professional Consult (permissible substitution) (INDEPENDENT_AMBULATORY_CARE_PROVIDER_SITE_OTHER): Payer: Managed Care, Other (non HMO) | Admitting: Surgery

## 2016-08-14 VITALS — BP 128/86 | HR 87 | Resp 16 | Ht 72.0 in | Wt 181.0 lb

## 2016-08-14 DIAGNOSIS — I712 Thoracic aortic aneurysm, without rupture, unspecified: Secondary | ICD-10-CM

## 2016-08-14 NOTE — Progress Notes (Signed)
PCP is Walker Kehr, MD Referring Provider is Javier Glazier, MD  Chief Complaint  Patient presents with  . 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 Ashok Cordia for progressive shortness of breath and cough. He had a high resolution CT of the chest on 03/19/2016 showing bronchiectasis and an incidental 4.1 cm fusiform ascending aortic aneurysm. He is on immunoglobulin replacement therapy for his immune dificiency and on inhaled steroids and antibiotics for his chronic bronchitis and bronchiectasis.  Past Medical History:  Diagnosis Date  . Anxiety   . At risk for sleep apnea    STOP-BANG= 5    SENT TO PCP 04-22-2014  . Bipolar I disorder ([place])   . Bleeding ulcer 06/2014  . BPH (benign prostatic hypertrophy) with urinary obstruction   . Chronic back pain   . Complication of anesthesia POST URINARY RETENTION---  2006 SHOULDER SURGERY MARKED BRADYCARDIA VAGAL RESPONSE NO ISSUE W/ SURGERY AFTER THIS ONE  . Coronary atherosclerosis CARDIOLOGIST- DR CRENSHAW--  LAST VISIT 01-05-2012 IN EPIC   NON-OBSTRUCTIVE MILD DISEASE  . 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[place])   . Depression   . Epicondylitis    right elbow  . GERD (gastroesophageal reflux disease)   . Glaucoma BOTH EYES -- NO DROPS   . History of chronic prostatitis   . 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 History of thyroid cancer PRIMARY (NO METS FROM ORBITAL CANCER)--   IN REMISSION   S/P TOTAL THYROIDECTOMY  , CHEMORADIATION  (ONCOLOGIST -- DR  Griffith Citron)  . Hyperlipidemia   . Hypertension   . Immune deficiency disorder ([place])    COMMON VARIABLE  . Nocturia   . OA (osteoarthritis)   . Positional vertigo   . Renal calculi LEFT KIDNEY-- NON-OBSTRUCTIVE  . Ulnar nerve compression    right elbow  . Urinary hesitancy     Past Surgical History:  Procedure Laterality Date  . CARDIAC CATHETERIZATION  01-16-2006  DR Marcello Moores WALL   MILD CORONARY ATHEROSCLEROSIS/ MID TO DISTAL LAD 40% STENOSIS/ LVF 50-55%  . KNEE ARTHROSCOPY  05/01/2012   Procedure: ARTHROSCOPY KNEE;  Surgeon: Johnn Hai, MD;  Location: [facility];  Service: Orthopedics;  Laterality: Left;  debridement and removal of loose body  . LEFT ANKLE ARTHROSCOPY W/ DEBRIDEMENT  05-12-2007  . LEFT HYDROCELECTOMY  03-29-2005   AND REPAIR LEFT INGUINAL HERNIA W/ MESH  . MOHS SURGERY  2002   RIGHT ORBITAL CANCER  . NASAL ENDOSCOPY  08-07-2005   RIGHT EPISTAXIS  / POST SEPTOPLASTY  (HX RIGHT ORBITAL CA & S/P RADIATION/ NECROSIS ANTERIOR END OF BOTH INFERIOR TURBINATES)  . occuloplastic surgery  2002  . PARS PLANA VITRECTOMY  11-06-2004   RIGHT EYE RADIATION RETINOPATHY W/ HEMORRHAGE  . REPAIR UNDESENDED RIGHT TESTICLE / RIGHT INGUINAL HERNIA  AGE 72  . RIGHT ANKLE ARTHROSCOPY W/ EXTENSIVE DEBRIDEMENT  04-05-2008   . RIGHT SUPRAOMOHYOID NECK DISSECTION   03-08-2003   ZONES 1,2,3;  SUBMANDIBULAR MASS / METASTATIC SQUAMOUS CELL CARCINOMA RIGHT NECK  . SEPTOPLASTY  NOV 2006  . SHOULDER ARTHROSCOPY W/ SUBACROMIAL DECOMPRESSION AND DISTAL CLAVICLE EXCISION  10-09-2008   AND DEBRIDEMENT OF RIGHT SHOULDER IMPINGEMENT & [facility] JOINT ARTHRITIS  . SPINE SURGERY    . TOTAL THYROIDECTOMY  11-03-2001   PAPILLARY THYROID CARCINOMA  . TRANSTHORACIC ECHOCARDIOGRAM  12/ 2012   grade I diastolic dysfunction/ ef 0000000  . ULNAR NERVE TRANSPOSITION Right 04/28/2014   Procedure: RIGHT ELBOW ULNA NERVE RELEASE TRANSPOSTION AND MEDIAL EPICONDYLAR DEBRIDEMENT AND REPAIR;  Surgeon:  Linna Hoff, MD;  Location: [place];  Service: Orthopedics;  Laterality: Right;    Family History  Problem Relation Age of Onset  . Depression Sister   . Rectal cancer Sister   . Lung cancer Brother   . Kidney disease Mother   . Depression Mother   . Stroke Father   . COPD Father   . Hypertension Father   . Prostate cancer Father   . Depression Daughter   . Drug abuse Daughter   . Psychiatric Illness Son     Social History Social History  Substance Use Topics  . Smoking status: Never Smoker  . Smokeless tobacco: Never Used  . Alcohol use 0.0 oz/week     Comment: 1-2 glasses per month    Current Outpatient Prescriptions  Medication Sig Dispense Refill  . albuterol (PROVENTIL HFA;VENTOLIN HFA) 108 (90 Base) MCG/ACT inhaler Inhale 2 puffs into the lungs every 4 (four) hours as needed for wheezing or shortness of breath. 1 Inhaler 3  . ALPHAGAN P 0.1 % SOLN Place 1 drop into both eyes 2 (two) times daily.     [name] amoxicillin-clavulanate (AUGMENTIN XR) 1000-62.5 MG 12 hr tablet Take 1 tablet by mouth 2 (two) times daily. 14 tablet 0  . aspirin EC 81 MG tablet Take 1 tablet by mouth daily.    [name] atorvastatin (LIPITOR) 40 MG tablet TAKE 1 TABLET (40 MG TOTAL) BY MOUTH DAILY. 90 tablet 3  . AXIRON 30 MG/ACT SOLN Place 2 Act onto the skin daily.    [name] azithromycin (ZITHROMAX) 250 MG tablet Take 1 tablet (250 mg total) by mouth daily. 6 tablet 0  . benzonatate (TESSALON) 100 MG capsule Take 1-2 capsules (100-200 mg total) by mouth 3 (three) times daily as needed for cough. 90 capsule 1  . budesonide-formoterol (SYMBICORT) 80-4.5 MCG/ACT inhaler Inhale 2 puffs into the lungs 2 (two) times daily. 1 Inhaler 0  . Coenzyme Q10 (CO Q 10 PO) Take by mouth daily.    [name] dicyclomine (BENTYL) 20 MG tablet TAKE 1 TABLET 3 TIMES A DAY AS NEEDED  6  . fluconazole (DIFLUCAN) 150 MG tablet Take 1 tablet (150 mg total) by mouth once. Reported on 08/04/2015 4 tablet 2  . fluticasone  furoate-vilanterol (BREO ELLIPTA) 100-25 MCG/INH AEPB Inhale 1 puff into the lungs daily. 1 each 3  . Immune Globulin, Human, (HIZENTRA) 4 GM/20ML SOLN Inject into the skin once a week. wednesday    . LamoTRIgine 300 MG TB24 Take 1 tablet (300 mg total) by mouth daily. 90 tablet 0  . lidocaine-prilocaine (EMLA) cream as needed.    [name] LORazepam (ATIVAN) 0.5 MG tablet Take 1 tab daily as needed and 1 tab at bed time 45 tablet 2  . LUTEIN PO Take by mouth daily.    . OMEGA-3 KRILL OIL PO Take by mouth daily.    . promethazine-codeine (PHENERGAN WITH CODEINE) 6.25-10 MG/5ML syrup  Take 5 mLs by mouth every 4 (four) hours as needed. 300 mL 0  . RABEprazole (ACIPHEX) 20 MG tablet TAKE 1 TABLET BY MOUTH EVERY DAY--  takes in pm    . Respiratory Therapy Supplies (FLUTTER) DEVI Use as directed 1 each 0  . Saw [place]metto, Serenoa repens, (SAW PALMETTO PO) Take by mouth.    . Spacer/Aero-Holding Chambers (AEROCHAMBER MV) inhaler Use as instructed 1 each 0  . sucralfate (CARAFATE) 1 G tablet Take 1 g by mouth as needed.   3  . SYNTHROID 150 MCG tablet Take 1 tablet (150 mcg total) by mouth daily. Brand name only please. Dispense as written 1 90 tablet 3  . tadalafil (CIALIS) 20 MG tablet Take 1 tablet (20 mg total) by mouth daily as needed for erectile dysfunction. 30 tablet 3  . telmisartan (MICARDIS) 80 MG tablet Take 1 tablet (80 mg total) by mouth daily. 90 tablet 3  . zolpidem (AMBIEN) 10 MG tablet Take 1 tablet (10 mg total) by mouth at bedtime as needed. for sleep 30 tablet 2   No current facility-administered medications for this visit.     Allergies  Allergen Reactions  . Percocet [Oxycodone-Acetaminophen] Itching    Can take generic.  States only has a problem with percocet brand    Review of Systems  Constitutional: Positive for activity change and fatigue.  HENT: Positive for hearing loss.   Eyes:       Floaters.  50% vision loss in right eye.    Respiratory: Positive for cough,  shortness of breath and wheezing.   Cardiovascular: Negative.   Gastrointestinal:       Hiatal hernia and reflux  Endocrine: Negative.   Genitourinary:       Enlarged prostate  Musculoskeletal: Positive for arthralgias and myalgias.  Skin:       Squamous cell carcinomas of scalp recently removed  Allergic/Immunologic: Negative.   Neurological:       Memory problems  Hematological: Negative.   Psychiatric/Behavioral:       Bipolar disorder    BP 128/86 (BP Location: Left Arm, Patient Position: Sitting, Cuff Size: Large)   Pulse 87   Resp 16   Ht 6' (1.829 m)   Wt 181 lb (82.1 kg)   SpO2 98% Comment: ON RA  BMI 24.55 kg/m  Physical Exam  Constitutional: He is oriented to person, place, and time. He appears well-developed and well-nourished. No distress.  HENT:  Head: Normocephalic and atraumatic.  Mouth/Throat: Oropharynx is clear and moist.  Eyes: EOM are normal. Pupils are equal, round, and reactive to light.  Neck: Normal range of motion. Neck supple. No JVD present.  Cardiovascular: Normal rate, regular rhythm, normal heart sounds and intact distal pulses.   No murmur heard. Pulmonary/Chest: Effort normal and breath sounds normal. No respiratory distress.  Abdominal: Soft. Bowel sounds are normal. He exhibits no distension and no mass. There is no tenderness.  Musculoskeletal: Normal range of motion. He exhibits no edema.  Lymphadenopathy:    He has no cervical adenopathy.  Neurological: He is alert and oriented to person, place, and time. He has normal strength. No cranial nerve deficit or sensory deficit.  Skin: Skin is warm and dry.     Diagnostic Tests:  Result status: Final result  ------------------------------------------------------------ Transthoracic Echocardiography  Patient:  Fady, Hales MR #:    [id number] Study Date: 08/01/2011 Gender:   M Age:    89 Height:   185.4cm Weight:   82.6kg  BSA:    2.79m^2 Pt.  Status: Room:  ATTENDING  [name], MD, [facility] ORDERING   [name], MD, [facility] REFERRING  [name], MD, [facility] SONOGRAPHER Cindy Hazy, RDCS PERFORMING  [facility], [facility] cc:  ------------------------------------------------------------ LV EF: 55% -  60%  ------------------------------------------------------------ Indications:   CAD of native vessels 414.01.  ------------------------------------------------------------ History:  PMH: Acquired from the patient and from the patient's chart. PMH: Coronary Atherosclerosis. Chest pain. Anxiety. COPD. Fatigue. Risk factors: Hypertension. Dyslipidemia.  ------------------------------------------------------------ Study Conclusions  Left ventricle: The cavity size was normal. Wall thickness was normal. Systolic function was normal. The estimated ejection fraction was in the range of 55% to 60%. Wall motion was normal; there were no regional wall motion abnormalities. Doppler parameters are consistent with abnormal left ventricular relaxation (grade 1 diastolic dysfunction).  Impressions:  - Redundant MV chordae noted. Transthoracic echocardiography. M-mode, complete 2D, spectral Doppler, and color Doppler. Height: Height: 185.4cm. Height: 73in. Weight: Weight: 82.6kg. Weight: 181.6lb. Body mass index: BMI: 24kg/m^2. Body surface area:  BSA: 2.49m^2. Blood pressure:   116/78. Patient status: Outpatient. Location: [place] [facility]  ------------------------------------------------------------  ------------------------------------------------------------ Left ventricle: The cavity size was normal. Wall thickness was normal. Systolic function was normal. The estimated ejection fraction was in the range of 55% to 60%. Wall motion was normal; there were no regional wall motion abnormalities. Doppler parameters are consistent with abnormal left ventricular relaxation  (grade 1 diastolic dysfunction).  ------------------------------------------------------------ Aortic valve:  Trileaflet; normal thickness leaflets. Mobility was not restricted. Doppler: Transvalvular velocity was within the normal range. There was no stenosis. No regurgitation.  ------------------------------------------------------------ Aorta: Aortic root: The aortic root was mildly dilated. Ascending aorta: The ascending aorta was mildly dilated.  ------------------------------------------------------------ Mitral valve:  Structurally normal valve.  Mobility was not restricted. Doppler: Transvalvular velocity was within the normal range. There was no evidence for stenosis. Trivial regurgitation.  ------------------------------------------------------------ Left atrium: The atrium was normal in size.  ------------------------------------------------------------ Right ventricle: The cavity size was normal. Systolic function was normal.  ------------------------------------------------------------ Pulmonic valve:  Doppler: Transvalvular velocity was within the normal range. There was no evidence for stenosis. Trivial regurgitation.  ------------------------------------------------------------ Tricuspid valve:  Structurally normal valve.  Doppler: Transvalvular velocity was within the normal range. Trivial regurgitation.  ------------------------------------------------------------ Pulmonary artery:  Systolic pressure was within the normal range.  ------------------------------------------------------------ Right atrium: The atrium was normal in size.  ------------------------------------------------------------ Pericardium: There was no pericardial effusion.  ------------------------------------------------------------ Systemic veins: Inferior vena cava: The vessel was normal in  size.  ------------------------------------------------------------  2D measurements    Normal Doppler measurements  Normal Left ventricle         Main pulmonary LVID ED,  33.2 mm   43-52  artery chord,             Pressure,   23 mm Hg =30 PLAX              S LVID ES,  21.6 mm   23-38  Left ventricle chord,             Ea, lat   8.01 cm/s ------ PLAX              ann, tiss FS, chord,  35 %   >29   DP PLAX              E/Ea, lat  7.47    ------ LVPW, ED  11.6 mm   ------ ann, tiss IVS/LVPW  0.91    <1.3  DP ratio,  ED           Ea, med   5.48 cm/s ------ Ventricular septum       ann, tiss IVS, ED  10.5 mm   ------ DP LVOT              E/Ea, med 10.91    ------ Diam, S   21 mm   ------ ann, tiss Area    3.46 cm^2  ------ DP Diam     21 mm   ------ LVOT Aorta             Peak vel,  112 cm/s ------ Root diam,  39 mm   ------ S ED               VTI, S   22.3 cm  ------ AAo AP    41 mm   ------ Peak      5 mm Hg ------ diam, S            gradient, Left atrium          S AP dim    37 mm   ------ Stroke vol 77.2 ml  ------ AP dim   1.79 cm/m^2 <2.2  Stroke   37.3 ml/m^ ------ index             index      2                Mitral valve                Peak E vel 59.8 cm/s ------                Peak A vel 74.9 cm/s ------                Decelerati  261 ms  150-23                on time        0                Peak E/A   0.8    ------                ratio                Tricuspid valve                Regurg    213 cm/s ------                 peak vel                Peak RV-RA  18 mm Hg ------                gradient,                S                Systemic veins                Estimated   5 mm Hg ------                CVP                Right ventricle                Pressure,   23 mm Hg <30  S                Sa vel,   15.8 cm/s ------                lat ann,                tiss DP  ------------------------------------------------------------ Prepared and Electronically Authenticated by  [name], MD, [facility] 2012-12-20T14:22:22.767   Study Result   CLINICAL DATA:  61 year old male with chronic cough and shortness of breath. Remote history of thyroid cancer and thyroidectomy. Remote history of right orbital squamous cell cancer.  EXAM: CT CHEST WITHOUT CONTRAST  TECHNIQUE: Multidetector CT imaging of the chest was performed following the standard protocol without intravenous contrast. High resolution imaging of the lungs, as well as inspiratory and expiratory imaging, was performed.  COMPARISON:  10/28/2014 chest CT.  11/10/2015 chest radiograph.  FINDINGS: Mediastinum/Nodes: Normal heart size. No significant pericardial fluid/thickening. Right coronary atherosclerosis. Atherosclerotic thoracic aorta. Mildly aneurysmal ascending thoracic aorta, maximum diameter 4.1 cm, stable. Normal caliber pulmonary arteries. Total thyroidectomy. Unremarkable esophagus. No pathologically enlarged axillary, mediastinal or gross hilar lymph nodes, noting limited sensitivity for the detection of hilar adenopathy on this noncontrast study.  Lungs/Pleura: No pneumothorax. No pleural effusion. No acute consolidative airspace disease, significant pulmonary nodules or lung masses. No significant  regions of subpleural reticulation, ground-glass attenuation, traction bronchiectasis, parenchymal banding, architectural distortion or frank honeycombing. No significant air trapping on the expiration sequence.  Upper abdomen: There a few simple appearing small liver cysts measuring up to 1.1 cm.  Musculoskeletal: No aggressive appearing focal osseous lesions. Healed deformities in the left posterior third, fourth and tenth ribs. Partially visualized subacute fracture with callus formation in the lateral right tenth rib. Moderate thoracic spondylosis.  IMPRESSION: 1. No evidence of interstitial lung disease. No active pulmonary disease. 2. Aortic atherosclerosis. Ascending thoracic aortic 4.1 cm aneurysm. Recommend annual imaging followup by CTA or MRA. This recommendation follows 2010 ACCF/AHA/AATS/ACR/ASA/SCA/SCAI/SIR/STS/SVM Guidelines for the Diagnosis and Management of Patients with Thoracic Aortic Disease. Circulation. 2010; [id number]. 3. Right coronary atherosclerosis. 4. Subacute lateral right tenth rib fracture, partially visualized, correlate with injury history.   Electronically Signed   By: Ilona Sorrel [name]   On: 03/19/2016 12:36     Impression:  This 60 year old gentleman has a 4.1 cm fusiform ascending aortic aneurysm that was found incidentally on CT scan done in the workup of shortness of breath. I have personally reviewed his CT study from 03/2016 and compared it to the previous CT scans that he has had done in the past for workup and follow up of his previous cancers. He had a CT of the chest in 2007 that showed the diameter of the ascending aorta to be 3.7 cm so the aneurysm has only enlarged 4 mm in 10 years. It is still well below the 5.5 cm threshold for recommending surgical treatment. This should be followed with yearly CT scans and this does not require contrast in this patient. I reviewed the CT scans with him and answered his questions. I  discussed the importance of good blood pressure control.  Plan:  Follow up in one year with a high resolution CT scan of the chest.  I spent 30 minutes performing this consultation and > 50% of this time was spent face to face counseling and coordinating the care of this patient's ascending aortic aneurysm.  Gaye Pollack, MD [facility] [phone]

## 2016-08-19 ENCOUNTER — Ambulatory Visit (INDEPENDENT_AMBULATORY_CARE_PROVIDER_SITE_OTHER): Payer: Managed Care, Other (non HMO) | Admitting: Licensed Clinical Social Worker

## 2016-08-19 ENCOUNTER — Encounter (HOSPITAL_COMMUNITY): Payer: Self-pay | Admitting: Licensed Clinical Social Worker

## 2016-08-19 DIAGNOSIS — F319 Bipolar disorder, unspecified: Secondary | ICD-10-CM

## 2016-08-19 LAB — FUNGUS CULTURE W SMEAR

## 2016-08-19 NOTE — Progress Notes (Signed)
Therapist Progress Note   Time: 2:10-3:00 pm  Participation Level: Active  Behavioral Response: Casual, Alert, Anxious  Type of Therapy: Individual Therapy  Treatment Goals Addressed: Coping  Interventions: Supportive/ CBT  Summary: Pt presented for individual therapy. Pt seemed more anxious today. He shares that he just bought a new car, even though his major stressor is $ and the inability to pay his bills. He and his wife made a joint decision to buy a car even though they have no residual $ monthly. They, together, have decided to stop paying for everything for all of their adult children. This is what has been discussed with pt for several sessions. Pt has fears that his children will be mad at him when he stops paying bills for his adult children. Pt still ruminates about his son and daughter who may never live independently of the home.Worked again with pt on how to use boundaries. He participated but says it will be hard. Discussed with pt if he chooses to not use boundaries then perhaps he needs to work on acceptance. Pt is not sure he is ready for acceptance at this point. He was receptive to suggestions during the intervention.  Suicidal/Homicidal: No  Therapist Response: Assessed pt's current functioning and reviewed progress. Assisted pt processing issues with family,  and processing for management of stressors.  Plan: Return in 2 weeks using CBT  Diagnosis: Axis 1: Bipolar 1 disorder,   [name], LCAS

## 2016-08-23 ENCOUNTER — Other Ambulatory Visit: Payer: Self-pay | Admitting: Pulmonary Disease

## 2016-08-27 ENCOUNTER — Encounter: Payer: Self-pay | Admitting: Pulmonary Disease

## 2016-08-27 ENCOUNTER — Other Ambulatory Visit: Payer: Self-pay | Admitting: Internal Medicine

## 2016-08-27 NOTE — Telephone Encounter (Signed)
Pt seen 12.12.17 by JN: Patient Instructions   Please try to remember to use your Flutter Valve/Acapella twice daily to help keep mucus from building up in your lungs.  I want you to use your Symbicort inhaler 1 puff twice daily with your spacer. If you don't have any problems with the medication you can go up to 2 puffs twice daily with your spacer. Remember to rinse, gargle, spit, brush your teeth, & brush your tongue to keep from getting thrush.  Call me for a prescription if your insurance will cover the Symbicort and you feel it is helping.  I am referring you to a thoracic surgeon to following you for your thoracic aortic aneurysm.   I will see you back in 44months or sooner if needed.    Per 1.16.18 e-mail from patient, the Symbicort is working well for him at the 2 puffs twice daily and he is requesting a 90 day Rx for this telephoned to [facility] called to requested pharmacy for #3 inhalers with 1 refill - spoke with Shirlean Mylar E-mail sent back to patient informing him of the above Nothing further needed at this time; will sign off

## 2016-08-29 ENCOUNTER — Other Ambulatory Visit (HOSPITAL_COMMUNITY): Payer: Self-pay | Admitting: Psychiatry

## 2016-08-29 DIAGNOSIS — F319 Bipolar disorder, unspecified: Secondary | ICD-10-CM

## 2016-08-30 ENCOUNTER — Ambulatory Visit (INDEPENDENT_AMBULATORY_CARE_PROVIDER_SITE_OTHER): Payer: Managed Care, Other (non HMO) | Admitting: Internal Medicine

## 2016-08-30 ENCOUNTER — Encounter: Payer: Self-pay | Admitting: Internal Medicine

## 2016-08-30 ENCOUNTER — Other Ambulatory Visit (HOSPITAL_COMMUNITY): Payer: Self-pay

## 2016-08-30 DIAGNOSIS — F411 Generalized anxiety disorder: Secondary | ICD-10-CM | POA: Diagnosis not present

## 2016-08-30 DIAGNOSIS — J471 Bronchiectasis with (acute) exacerbation: Secondary | ICD-10-CM | POA: Diagnosis not present

## 2016-08-30 DIAGNOSIS — I10 Essential (primary) hypertension: Secondary | ICD-10-CM

## 2016-08-30 DIAGNOSIS — J42 Unspecified chronic bronchitis: Secondary | ICD-10-CM | POA: Diagnosis not present

## 2016-08-30 DIAGNOSIS — F319 Bipolar disorder, unspecified: Secondary | ICD-10-CM

## 2016-08-30 MED ORDER — METHYLPREDNISOLONE ACETATE 80 MG/ML IJ SUSP
80.0000 mg | Freq: Once | INTRAMUSCULAR | Status: AC
  Administered 2016-08-30: 80 mg via INTRAMUSCULAR

## 2016-08-30 MED ORDER — AMOXICILLIN-POT CLAVULANATE ER 1000-62.5 MG PO TB12: 2.0000 | ORAL_TABLET | Freq: Two times a day (BID) | ORAL | 0 refills | Status: DC

## 2016-08-30 MED ORDER — PROMETHAZINE-CODEINE 6.25-10 MG/5ML PO SYRP: 5.0000 mL | ORAL_SOLUTION | ORAL | 0 refills | Status: DC | PRN

## 2016-08-30 MED ORDER — LEVALBUTEROL TARTRATE 45 MCG/ACT IN AERO: 2.0000 | INHALATION_SPRAY | Freq: Four times a day (QID) | RESPIRATORY_TRACT | 5 refills | Status: DC | PRN

## 2016-08-30 MED ORDER — LORAZEPAM 0.5 MG PO TABS: ORAL_TABLET | ORAL | 0 refills | Status: DC

## 2016-08-30 NOTE — Assessment & Plan Note (Addendum)
1/18 exacerbation: Augmentin XR Depo-medrol 80 mg Xopenex MDI (Proair causing palpitations)

## 2016-08-30 NOTE — Addendum Note (Signed)
Addended by: Cresenciano Lick on: 08/30/2016 04:43 PM   Modules accepted: Orders

## 2016-08-30 NOTE — Progress Notes (Signed)
Subjective:  Patient ID: Alex [name], male    DOB: June 09, 1956  Age: 61 y.o. MRN: [id number]  CC: No chief complaint on file.   HPI Alex [name] presents for URI x 1 week - worse F/u asthma - worse C/o anxiety from Ventolin  Outpatient Medications Prior to Visit  Medication Sig Dispense Refill  . albuterol (PROVENTIL HFA;VENTOLIN HFA) 108 (90 Base) MCG/ACT inhaler Inhale 2 puffs into the lungs every 4 (four) hours as needed for wheezing or shortness of breath. 1 Inhaler 3  . ALPHAGAN P 0.1 % SOLN Place 1 drop into both eyes 2 (two) times daily.     [name] aspirin EC 81 MG tablet Take 1 tablet by mouth daily.    [name] atorvastatin (LIPITOR) 40 MG tablet TAKE 1 TABLET (40 MG TOTAL) BY MOUTH DAILY. 90 tablet 3  . AXIRON 30 MG/ACT SOLN Place 2 Act onto the skin daily.    . budesonide-formoterol (SYMBICORT) 80-4.5 MCG/ACT inhaler Inhale 2 puffs into the lungs 2 (two) times daily. 1 Inhaler 0  . Coenzyme Q10 (CO Q 10 PO) Take by mouth daily.    [name] dicyclomine (BENTYL) 20 MG tablet TAKE 1 TABLET 3 TIMES A DAY AS NEEDED  6  . fluconazole (DIFLUCAN) 150 MG tablet Take 1 tablet (150 mg total) by mouth once. Reported on 08/04/2015 4 tablet 2  . fluticasone furoate-vilanterol (BREO ELLIPTA) 100-25 MCG/INH AEPB Inhale 1 puff into the lungs daily. 1 each 3  . Immune Globulin, Human, (HIZENTRA) 4 GM/20ML SOLN Inject into the skin once a week. wednesday    . LamoTRIgine 300 MG TB24 Take 1 tablet (300 mg total) by mouth daily. 90 tablet 0  . lidocaine-prilocaine (EMLA) cream as needed.    [name] LORazepam (ATIVAN) 0.5 MG tablet Take 1 tab daily as needed and 1 tab at bed time 8 tablet 0  . LUTEIN PO Take by mouth daily.    . OMEGA-3 KRILL OIL PO Take by mouth daily.    . promethazine-codeine (PHENERGAN WITH CODEINE) 6.25-10 MG/5ML syrup Take 5 mLs by mouth every 4 (four) hours as needed. 300 mL 0  . RABEprazole (ACIPHEX) 20 MG tablet TAKE 1 TABLET BY MOUTH EVERY DAY--  takes in pm    . Respiratory Therapy  Supplies (FLUTTER) DEVI Use as directed 1 each 0  . Saw Palmetto, Serenoa repens, (SAW PALMETTO PO) Take by mouth.    . Spacer/Aero-Holding Chambers (AEROCHAMBER MV) inhaler Use as instructed 1 each 0  . sucralfate (CARAFATE) 1 G tablet Take 1 g by mouth as needed.   3  . SYNTHROID 150 MCG tablet Take 1 tablet (150 mcg total) by mouth daily. Brand name only please. Dispense as written 1 90 tablet 3  . SYNTHROID 150 MCG tablet TAKE 1 TABLET BY MOUTH EVERY DAY 90 tablet 3  . telmisartan (MICARDIS) 80 MG tablet Take 1 tablet (80 mg total) by mouth daily. 90 tablet 3  . zolpidem (AMBIEN) 10 MG tablet Take 1 tablet (10 mg total) by mouth at bedtime as needed. for sleep 30 tablet 2  . amoxicillin-clavulanate (AUGMENTIN XR) 1000-62.5 MG 12 hr tablet Take 1 tablet by mouth 2 (two) times daily. 14 tablet 0  . azithromycin (ZITHROMAX) 250 MG tablet Take 1 tablet (250 mg total) by mouth daily. 6 tablet 0  . benzonatate (TESSALON) 100 MG capsule Take 1-2 capsules (100-200 mg total) by mouth 3 (three) times daily as needed for cough. (Patient not taking: Reported on 08/30/2016)  90 capsule 1  . tadalafil (CIALIS) 20 MG tablet Take 1 tablet (20 mg total) by mouth daily as needed for erectile dysfunction. 30 tablet 3   No facility-administered medications prior to visit.     ROS Review of Systems  Constitutional: Positive for fatigue. Negative for appetite change and unexpected weight change.  HENT: Positive for voice change. Negative for congestion, nosebleeds, sneezing, sore throat and trouble swallowing.   Eyes: Negative for itching and visual disturbance.  Respiratory: Positive for cough, shortness of breath and wheezing.   Cardiovascular: Negative for chest pain, palpitations and leg swelling.  Gastrointestinal: Negative for abdominal distention, blood in stool, diarrhea and nausea.  Genitourinary: Negative for frequency and hematuria.  Musculoskeletal: Negative for back pain, gait problem, joint  swelling and neck pain.  Skin: Negative for rash.  Neurological: Negative for dizziness, tremors, speech difficulty and weakness.  Psychiatric/Behavioral: Negative for agitation, dysphoric mood and sleep disturbance. The patient is nervous/anxious.     Objective:  BP (!) 160/108   Pulse 79   Temp 98.3 F (36.8 C) (Oral)   Wt 182 lb (82.6 kg)   SpO2 94%   BMI 24.68 kg/m   BP Readings from Last 3 Encounters:  08/30/16 (!) 160/108  08/14/16 128/86  07/23/16 130/82    Wt Readings from Last 3 Encounters:  08/30/16 182 lb (82.6 kg)  08/14/16 181 lb (82.1 kg)  07/23/16 183 lb (83 kg)    Physical Exam  Constitutional: He is oriented to person, place, and time. He appears well-developed. No distress.  NAD  HENT:  Mouth/Throat: Oropharynx is clear and moist.  Eyes: Conjunctivae are normal. Pupils are equal, round, and reactive to light.  Neck: Normal range of motion. No JVD present. No thyromegaly present.  Cardiovascular: Normal rate, regular rhythm, normal heart sounds and intact distal pulses.  Exam reveals no gallop and no friction rub.   No murmur heard. Pulmonary/Chest: Effort normal and breath sounds normal. No respiratory distress. He has no wheezes. He has no rales. He exhibits no tenderness.  Abdominal: Soft. Bowel sounds are normal. He exhibits no distension and no mass. There is no tenderness. There is no rebound and no guarding.  Musculoskeletal: Normal range of motion. He exhibits no edema or tenderness.  Lymphadenopathy:    He has no cervical adenopathy.  Neurological: He is alert and oriented to person, place, and time. He has normal reflexes. No cranial nerve deficit. He exhibits normal muscle tone. He displays a negative Romberg sign. Coordination and gait normal.  Skin: Skin is warm and dry. No rash noted.  Psychiatric: His behavior is normal. Judgment and thought content normal.  hoarse Mild rhonchi B   Lab Results  Component Value Date   WBC 11.9 (H)  07/23/2016   HGB 16.3 07/23/2016   HCT 47.9 07/23/2016   PLT 210 07/23/2016   GLUCOSE 82 07/23/2016   CHOL 151 07/23/2016   TRIG 175 (H) 07/23/2016   HDL 74 07/23/2016   LDLDIRECT 59.0 02/02/2016   LDLCALC 42 07/23/2016   ALT 40 07/23/2016   AST 24 07/23/2016   NA 139 07/23/2016   K 3.7 07/23/2016   CL 101 07/23/2016   CREATININE 1.03 07/23/2016   BUN 20 07/23/2016   CO2 27 07/23/2016   TSH 3.50 07/23/2016   PSA 1.5 07/23/2016   HGBA1C 6.1 08/04/2012    Mr Ankle Left  Wo Contrast  Result Date: 04/08/2016 CLINICAL DATA:  Worsening left ankle pain, stiffness and swelling over the  past 3 months. Symptoms are worst laterally. No known injury. EXAM: MRI OF THE LEFT ANKLE WITHOUT CONTRAST TECHNIQUE: Multiplanar, multisequence MR imaging of the ankle was performed. No intravenous contrast was administered. COMPARISON:  None. FINDINGS: TENDONS Peroneal: Intact. Posteromedial: Intact. Anterior: Intact. Achilles: Intact. Plantar Fascia: Intact. LIGAMENTS Lateral: Intact. Medial: Intact. CARTILAGE Ankle Joint: Unremarkable. Subtalar Joints/Sinus Tarsi: Appear normal. Bones: No acute bony or joint abnormality is identified. Small bone fragment without edema is seen off the anterior aspect of the medial malleolus which may be due to old injury. Other: There is some subcutaneous edema about the ankle which appears worse on the medial side. IMPRESSION: No acute abnormality.  The subtalar joint appears normal. Small bone fragment off the tip of the medial malleolus may be due to old trauma. Mild subcutaneous edema about the ankle is worst medially. Electronically Signed   By: Inge Rise [name]   On: 04/08/2016 09:28    Assessment & Plan:   There are no diagnoses linked to this encounter. I have discontinued Mr. Kubacki azithromycin and amoxicillin-clavulanate. I am also having him maintain his Immune Globulin (Human), Coenzyme Q10 (CO Q 10 PO), OMEGA-3 KRILL OIL PO, RABEprazole, sucralfate,  AXIRON, ALPHAGAN P, lidocaine-prilocaine, aspirin EC, atorvastatin, dicyclomine, fluticasone furoate-vilanterol, fluconazole, SYNTHROID, telmisartan, benzonatate, (Saw Palmetto, Serenoa repens, (SAW PALMETTO PO)), LUTEIN PO, FLUTTER, albuterol, LamoTRIgine, zolpidem, promethazine-codeine, tadalafil, AEROCHAMBER MV, budesonide-formoterol, SYNTHROID, and LORazepam.  No orders of the defined types were placed in this encounter.    Follow-up: No Follow-up on file.  Walker Kehr, MD

## 2016-08-30 NOTE — Assessment & Plan Note (Signed)
Use Xopenex MDI in place of Proair (Proair causing palpitations)

## 2016-08-30 NOTE — Patient Instructions (Signed)
Use over-the-counter  "cold" medicines  such as"Afrin" nasal spray for nasal congestion as directed instead. Use " Delsym" or" Robitussin" cough syrup varietis for cough.  You can use plain "Tylenol" or "Advil" for fever, chills and achyness. Use Halls or Ricola cough drops.  Please, make an appointment if you are not better or if you're worse.  

## 2016-08-30 NOTE — Progress Notes (Signed)
Pre visit review using our clinic review tool, if applicable. No additional management support is needed unless otherwise documented below in the visit note. 

## 2016-08-30 NOTE — Assessment & Plan Note (Signed)
Augmentin XR

## 2016-08-30 NOTE — Assessment & Plan Note (Signed)
Amlodipine, Micardis 

## 2016-09-02 ENCOUNTER — Encounter: Payer: Self-pay | Admitting: Internal Medicine

## 2016-09-02 ENCOUNTER — Ambulatory Visit (HOSPITAL_COMMUNITY): Payer: Self-pay | Admitting: Licensed Clinical Social Worker

## 2016-09-02 ENCOUNTER — Encounter: Payer: Self-pay | Admitting: Pulmonary Disease

## 2016-09-04 ENCOUNTER — Telehealth: Payer: Self-pay | Admitting: *Deleted

## 2016-09-04 ENCOUNTER — Ambulatory Visit (INDEPENDENT_AMBULATORY_CARE_PROVIDER_SITE_OTHER): Payer: Managed Care, Other (non HMO) | Admitting: Psychiatry

## 2016-09-04 ENCOUNTER — Encounter (HOSPITAL_COMMUNITY): Payer: Self-pay | Admitting: Psychiatry

## 2016-09-04 DIAGNOSIS — Z818 Family history of other mental and behavioral disorders: Secondary | ICD-10-CM

## 2016-09-04 DIAGNOSIS — Z79899 Other long term (current) drug therapy: Secondary | ICD-10-CM

## 2016-09-04 DIAGNOSIS — Z7982 Long term (current) use of aspirin: Secondary | ICD-10-CM

## 2016-09-04 DIAGNOSIS — Z808 Family history of malignant neoplasm of other organs or systems: Secondary | ICD-10-CM

## 2016-09-04 DIAGNOSIS — Z8249 Family history of ischemic heart disease and other diseases of the circulatory system: Secondary | ICD-10-CM

## 2016-09-04 DIAGNOSIS — F3162 Bipolar disorder, current episode mixed, moderate: Secondary | ICD-10-CM | POA: Diagnosis not present

## 2016-09-04 DIAGNOSIS — Z801 Family history of malignant neoplasm of trachea, bronchus and lung: Secondary | ICD-10-CM

## 2016-09-04 DIAGNOSIS — F319 Bipolar disorder, unspecified: Secondary | ICD-10-CM

## 2016-09-04 DIAGNOSIS — Z888 Allergy status to other drugs, medicaments and biological substances status: Secondary | ICD-10-CM

## 2016-09-04 DIAGNOSIS — Z813 Family history of other psychoactive substance abuse and dependence: Secondary | ICD-10-CM

## 2016-09-04 DIAGNOSIS — Z841 Family history of disorders of kidney and ureter: Secondary | ICD-10-CM

## 2016-09-04 DIAGNOSIS — Z823 Family history of stroke: Secondary | ICD-10-CM

## 2016-09-04 LAB — AFB CULTURE WITH SMEAR (NOT AT ARMC)

## 2016-09-04 MED ORDER — LORAZEPAM 0.5 MG PO TABS: ORAL_TABLET | ORAL | 2 refills | Status: DC

## 2016-09-04 MED ORDER — LAMOTRIGINE ER 300 MG PO TB24: 1.0000 | ORAL_TABLET | Freq: Every day | ORAL | 0 refills | Status: DC

## 2016-09-04 MED ORDER — ZOLPIDEM TARTRATE 10 MG PO TABS: 10.0000 mg | ORAL_TABLET | Freq: Every evening | ORAL | 1 refills | Status: DC | PRN

## 2016-09-04 NOTE — Telephone Encounter (Signed)
Rec fax from [place] stating Synthroid is not covered on the patient's formulary. Needs new rx for generic medication. Please advise.

## 2016-09-04 NOTE — Progress Notes (Signed)
BH MD/PA/NP OP Progress Note  09/04/2016 11:54 AM MARKALE FINNER  MRN:  [id number]  Chief Complaint:  Subjective:  I'm doing so-so.  I'm taking medication and it is helping my mood swing and anger.  HPI: Alex [name] came for his follow-up appointment.  He is taking his medication as prescribed.  Recently he seen his primary care physician for cough and he was diagnosed with bronchitis.  He is taking inhaler which is helping his breathing.  He started seeing Benjamine Mola in this office for counseling.  He is seeing much improvement with the counseling.  He has chronic stressors which are family issues and chronic health issues.  Her son is in hospital and may go to state hospital tomorrow because he is not doing well.  Patient's son has chronic psychiatric illness.  Patient is upset on his daughter who is not financially helping him.  Patient admitted his chronic issues are not going to change but he is handling them better in counseling.  He denies any mania, psychosis, hallucination or any crying spells.  He denies any feeling of hopelessness or worthlessness.  He has no rash or itching.  He sleeping good.  He is not drinking or using any illegal substances.  His energy level is fair.  He denies any suicidal thoughts or homicidal thought.  He wants to continue his current psychiatric medication.  Visit Diagnosis:    ICD-9-CM ICD-10-CM   1. Bipolar I disorder (HCC) 296.7 F31.9 LamoTRIgine 300 MG TB24  2. Bipolar 1 disorder, mixed, moderate (HCC) 296.62 F31.62 zolpidem (AMBIEN) 10 MG tablet  3. Bipolar 1 disorder (HCC) 296.7 F31.9 LORazepam (ATIVAN) 0.5 MG tablet    Past Psychiatric History: Reviewed.  Past Medical History:  Past Medical History:  Diagnosis Date  . Anxiety   . At risk for sleep apnea    STOP-BANG= 5    SENT TO PCP 04-22-2014  . Bipolar I disorder ([place])   . Bleeding ulcer 06/2014  . BPH (benign prostatic hypertrophy) with urinary obstruction   . Chronic back pain   . Complication  of anesthesia POST URINARY RETENTION---  2006 SHOULDER SURGERY MARKED BRADYCARDIA VAGAL RESPONSE NO ISSUE W/ SURGERY AFTER THIS ONE  . Coronary atherosclerosis CARDIOLOGIST- DR CRENSHAW--  LAST VISIT 01-05-2012 IN EPIC   NON-OBSTRUCTIVE MILD DISEASE  . 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 Depression   . Epicondylitis    right elbow  . GERD (gastroesophageal reflux disease)   . Glaucoma BOTH EYES -- NO DROPS   . History of chronic prostatitis   . 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 History of thyroid cancer PRIMARY (NO METS FROM ORBITAL CANCER)--   IN REMISSION   S/P TOTAL THYROIDECTOMY  , CHEMORADIATION  (ONCOLOGIST -- DR Griffith Citron)  . Hyperlipidemia   . Hypertension   . Immune deficiency disorder ([place])    COMMON VARIABLE  . Nocturia   . OA (osteoarthritis)   . Positional vertigo   . Renal calculi LEFT KIDNEY-- NON-OBSTRUCTIVE  . Ulnar nerve compression    right elbow  . Urinary hesitancy     Past Surgical History:  Procedure Laterality Date  . CARDIAC CATHETERIZATION  01-16-2006  DR Marcello Moores WALL   MILD CORONARY ATHEROSCLEROSIS/ MID TO DISTAL LAD 40% STENOSIS/ LVF  50-55%  . KNEE ARTHROSCOPY  05/01/2012   Procedure: ARTHROSCOPY KNEE;  Surgeon: Johnn Hai, MD;  Location: [facility];  Service: Orthopedics;  Laterality: Left;  debridement and removal of loose body  . LEFT ANKLE ARTHROSCOPY W/ DEBRIDEMENT  05-12-2007  . LEFT HYDROCELECTOMY  03-29-2005   AND REPAIR LEFT INGUINAL HERNIA W/ MESH  . MOHS SURGERY  2002   RIGHT ORBITAL CANCER  . NASAL ENDOSCOPY  08-07-2005   RIGHT EPISTAXIS  / POST SEPTOPLASTY  (HX RIGHT ORBITAL CA & S/P RADIATION/ NECROSIS ANTERIOR END OF BOTH INFERIOR TURBINATES)  . occuloplastic surgery  2002  . PARS PLANA VITRECTOMY  11-06-2004   RIGHT EYE RADIATION  RETINOPATHY W/ HEMORRHAGE  . REPAIR UNDESENDED RIGHT TESTICLE / RIGHT INGUINAL HERNIA  AGE 26  . RIGHT ANKLE ARTHROSCOPY W/ EXTENSIVE DEBRIDEMENT  04-05-2008   . RIGHT SUPRAOMOHYOID NECK DISSECTION   03-08-2003   ZONES 1,2,3;   SUBMANDIBULAR MASS / METASTATIC SQUAMOUS CELL CARCINOMA RIGHT NECK  . SEPTOPLASTY  NOV 2006  . SHOULDER ARTHROSCOPY W/ SUBACROMIAL DECOMPRESSION AND DISTAL CLAVICLE EXCISION  10-09-2008   AND DEBRIDEMENT OF RIGHT SHOULDER IMPINGEMENT & [facility] JOINT ARTHRITIS  . SPINE SURGERY    . TOTAL THYROIDECTOMY  11-03-2001   PAPILLARY THYROID CARCINOMA  . TRANSTHORACIC ECHOCARDIOGRAM  12/ 2012   grade I diastolic dysfunction/ ef 0000000  . ULNAR NERVE TRANSPOSITION Right 04/28/2014   Procedure: RIGHT ELBOW ULNA NERVE RELEASE TRANSPOSTION AND MEDIAL EPICONDYLAR DEBRIDEMENT AND REPAIR;  Surgeon: Linna Hoff, MD;  Location: [facility];  Service: Orthopedics;  Laterality: Right;    Family Psychiatric History: Reviewed.  Family History:  Family History  Problem Relation Age of Onset  . Depression Sister   . Rectal cancer Sister   . Lung cancer Brother   . Kidney disease Mother   . Depression Mother   . Stroke Father   . COPD Father   . Hypertension Father   . Prostate cancer Father   . Depression Daughter   . Drug abuse Daughter   . Psychiatric Illness Son     Social History:  Social History   Social History  . Marital status: Married    Spouse name: N/A  . Number of children: N/A  . Years of education: N/A   Social History Main Topics  . Smoking status: Never Smoker  . Smokeless tobacco: Never Used  . Alcohol use 0.0 oz/week     Comment: 1-2 glasses per month  . Drug use: No  . Sexual activity: Not on file   Other Topics Concern  . Not on file   Social History Narrative   Originally from [place]. Moved to [place] in 1984. Previously worked in Designer, jewellery as a Scientist, research (physical sciences), Social research officer, government. for 22 years. 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 No international travel other than [place]. Has a dog at home, poodle mix. His daughter who lives with him now has a dog. No mold exposure. No bird exposure. No hot tub exposure. Enjoys gardening.     Allergies:  Allergies  Allergen Reactions  . Percocet [Oxycodone-Acetaminophen] Itching    Can take generic.  States only has a problem with percocet brand    Metabolic Disorder Labs: Lab Results  Component Value Date   HGBA1C 6.1 08/04/2012   MPG 123 (H) 01/05/2012   No results found for: PROLACTIN Lab Results  Component Value Date   CHOL  151 07/23/2016   TRIG 175 (H) 07/23/2016   HDL 74 07/23/2016   CHOLHDL 2.0 07/23/2016   VLDL 35 (H) 07/23/2016   LDLCALC 42 07/23/2016   LDLCALC 86 08/01/2015     Current Medications: Current Outpatient Prescriptions  Medication Sig Dispense Refill  . ALPHAGAN P 0.1 % SOLN Place 1 drop into both eyes 2 (two) times daily.     [name] amoxicillin-clavulanate (AUGMENTIN XR) 1000-62.5 MG 12 hr tablet Take 2 tablets by mouth 2 (two) times daily. 20 tablet 0  . aspirin EC 81 MG tablet Take 1 tablet by mouth daily.    [name] atorvastatin (LIPITOR) 40 MG tablet TAKE 1 TABLET (40 MG TOTAL) BY MOUTH DAILY. 90 tablet 3  . AXIRON 30 MG/ACT SOLN Place 2 Act onto the skin daily.    . benzonatate (TESSALON) 100 MG capsule Take 1-2 capsules (100-200 mg total) by mouth 3 (three) times daily as needed for cough. (Patient not taking: Reported on 08/30/2016) 90 capsule 1  . budesonide-formoterol (SYMBICORT) 80-4.5 MCG/ACT inhaler Inhale 2 puffs into the lungs 2 (two) times daily. 1 Inhaler 0  . Coenzyme Q10 (CO Q 10 PO) Take by mouth daily.    [name] dicyclomine (BENTYL) 20 MG tablet TAKE 1 TABLET 3 TIMES A DAY AS NEEDED  6  . fluconazole (DIFLUCAN) 150 MG tablet Take 1 tablet (150 mg total) by mouth once. Reported on 08/04/2015 4 tablet 2  . Immune Globulin, Human, (HIZENTRA) 4 GM/20ML SOLN Inject into the skin once a week.  wednesday    . LamoTRIgine 300 MG TB24 Take 1 tablet (300 mg total) by mouth daily. 90 tablet 0  . levalbuterol (XOPENEX HFA) 45 MCG/ACT inhaler Inhale 2 puffs into the lungs every 6 (six) hours as needed for wheezing. 1 Inhaler 5  . lidocaine-prilocaine (EMLA) cream as needed.    [name] LORazepam (ATIVAN) 0.5 MG tablet Take 1 tab daily as needed and 1 tab at bed time 30 tablet 2  . LUTEIN PO Take by mouth daily.    . OMEGA-3 KRILL OIL PO Take by mouth daily.    . promethazine-codeine (PHENERGAN WITH CODEINE) 6.25-10 MG/5ML syrup Take 5 mLs by mouth every 4 (four) hours as needed. 300 mL 0  . RABEprazole (ACIPHEX) 20 MG tablet TAKE 1 TABLET BY MOUTH EVERY DAY--  takes in pm    . Respiratory Therapy Supplies (FLUTTER) DEVI Use as directed 1 each 0  . Saw Palmetto, Serenoa repens, (SAW PALMETTO PO) Take by mouth.    . Spacer/Aero-Holding Chambers (AEROCHAMBER MV) inhaler Use as instructed 1 each 0  . sucralfate (CARAFATE) 1 G tablet Take 1 g by mouth as needed.   3  . SYNTHROID 150 MCG tablet Take 1 tablet (150 mcg total) by mouth daily. Brand name only please. Dispense as written 1 90 tablet 3  . SYNTHROID 150 MCG tablet TAKE 1 TABLET BY MOUTH EVERY DAY 90 tablet 3  . tadalafil (CIALIS) 20 MG tablet Take 1 tablet (20 mg total) by mouth daily as needed for erectile dysfunction. 30 tablet 3  . telmisartan (MICARDIS) 80 MG tablet Take 1 tablet (80 mg total) by mouth daily. 90 tablet 3  . zolpidem (AMBIEN) 10 MG tablet Take 1 tablet (10 mg total) by mouth at bedtime as needed. for sleep 30 tablet 1   No current facility-administered medications for this visit.     Neurologic: Headache: No Seizure: No Paresthesias: No  Musculoskeletal: Strength & Muscle Tone: within normal limits Gait &  Station: normal Patient leans: N/A  Psychiatric Specialty Exam: Review of Systems  Constitutional: Negative.   Skin: Negative for itching and rash.  Neurological: Negative for tremors.    There were no  vitals taken for this visit.There is no height or weight on file to calculate BMI.  General Appearance: Casual  Eye Contact:  Good  Speech:  Clear and Coherent  Volume:  Normal  Mood:  Anxious  Affect:  Congruent  Thought Process:  Goal Directed  Orientation:  Full (Time, Place, and Person)  Thought Content: WDL and Logical   Suicidal Thoughts:  No  Homicidal Thoughts:  No  Memory:  Immediate;   Good Recent;   Good Remote;   Good  Judgement:  Good  Insight:  Good  Psychomotor Activity:  Normal  Concentration:  Concentration: Good and Attention Span: Good  Recall:  Good  Fund of Knowledge: Good  Language: Good  Akathisia:  No  Handed:  Right  AIMS (if indicated):  0  Assets:  Communication Skills Desire for Improvement Housing Resilience  ADL's:  Intact  Cognition: WNL  Sleep:  Good    Assessment: Bipolar disorder type I.  Plan: Despite chronic issues and stressors he is stable on his current psychiatric medication.  He wants to continue his current psychiatric medication.  He is seeing Benjamine Mola for counseling.  I will continue Ambien 10 mg half to one tablet as needed, Lamictal 300 mg daily and Ativan 0.5 mg at bedtime as needed.  Discussed medication side effects and benefits.  Patient has no rash itching or any headaches.  Recommended to call us back if he has any question, concern if he feel worsening of the symptom.  Follow-up in 3 months.  Wes Lezotte T., MD 09/04/2016, 11:54 AM

## 2016-09-05 NOTE — Telephone Encounter (Signed)
Ok generic if ok w/pt - see Rx Thx

## 2016-09-05 NOTE — Telephone Encounter (Signed)
Left mess for patient to call back.  

## 2016-09-06 ENCOUNTER — Encounter: Payer: Self-pay | Admitting: Internal Medicine

## 2016-09-06 MED ORDER — LEVOTHYROXINE SODIUM 150 MCG PO TABS: 150.0000 ug | ORAL_TABLET | Freq: Every day | ORAL | 3 refills | Status: DC

## 2016-09-06 NOTE — Telephone Encounter (Signed)
Pt called and wants to know if you can call in the generic for synthroid and also wants to know if he can request follow up labs after 2 months. Please advise thanks.

## 2016-09-06 NOTE — Telephone Encounter (Signed)
Levothyroxine sent. See meds

## 2016-09-09 ENCOUNTER — Other Ambulatory Visit (HOSPITAL_COMMUNITY): Payer: Self-pay

## 2016-09-09 ENCOUNTER — Other Ambulatory Visit: Payer: Self-pay | Admitting: Internal Medicine

## 2016-09-09 DIAGNOSIS — F319 Bipolar disorder, unspecified: Secondary | ICD-10-CM

## 2016-09-09 MED ORDER — LORAZEPAM 0.5 MG PO TABS: ORAL_TABLET | ORAL | 2 refills | Status: DC

## 2016-09-16 ENCOUNTER — Ambulatory Visit (INDEPENDENT_AMBULATORY_CARE_PROVIDER_SITE_OTHER): Payer: Managed Care, Other (non HMO) | Admitting: Licensed Clinical Social Worker

## 2016-09-16 DIAGNOSIS — F319 Bipolar disorder, unspecified: Secondary | ICD-10-CM | POA: Diagnosis not present

## 2016-09-16 NOTE — Progress Notes (Signed)
Therapist Progress Note   Time: 3:10-4:00 pm  Participation Level: Active  Behavioral Response: Casual, Alert, Anxious  Type of Therapy: Individual Therapy  Treatment Goals Addressed: Coping  Interventions: Supportive/ CBT  Summary: Pt presented for individual therapy. Pt seemed more resigned today. He shares that he went and picked up his son from a 2 week stay at [facility] last week. His son is stable on his medications and back at work. Pt has hopes for his son to be "normal" again and reports its hard to accept his son will have major mental health issues the rest of his life. Processed at length acceptance: of his son, of his daughter who lives with he and his wife and has not motivation and is allowed to lay in the bed all day, his wife's aloofness to the financial stress pt endures daily. Processed with pt what he is willing to do different to cause less stressors in his life and he already feels like the "bad guy" in the family and doesn't want to "rock the boat" anymore. Have previously role-played boundaries with pt but he finds it hard to use them. THen again processed with pt "acceptance." which he really is not ready to grasp either. Pt is not sure he is ready for acceptance at this point. Pt feels hopeless at times that his life will ever change. Reminded pt it will have to be he who changes. Processed with pt coping tools to deal with his frustration, and anxiety, in his current life. He was receptive to suggestions during the intervention.  Suicidal/Homicidal: No  Therapist Response: Assessed pt's current functioning and reviewed progress. Assisted pt processing issues with family, feelings of hopelessness, boundaries, acceptance  and processing for management of stressors.  Plan: Return in 2 weeks using CBT  Diagnosis: Axis 1: Bipolar 1 disorder,   [name], LCAS

## 2016-09-18 ENCOUNTER — Encounter (HOSPITAL_COMMUNITY): Payer: Self-pay | Admitting: Licensed Clinical Social Worker

## 2016-09-30 ENCOUNTER — Ambulatory Visit (HOSPITAL_COMMUNITY): Payer: Self-pay | Admitting: Licensed Clinical Social Worker

## 2016-10-15 ENCOUNTER — Ambulatory Visit (INDEPENDENT_AMBULATORY_CARE_PROVIDER_SITE_OTHER): Payer: Managed Care, Other (non HMO) | Admitting: Licensed Clinical Social Worker

## 2016-10-15 DIAGNOSIS — F319 Bipolar disorder, unspecified: Secondary | ICD-10-CM

## 2016-10-15 NOTE — Progress Notes (Signed)
Therapist Progress Note   Time: 11:10-12:00 pm  Participation Level: Active  Behavioral Response: Casual, Alert, Anxious  Type of Therapy: Individual Therapy  Treatment Goals Addressed: Coping  Interventions: Supportive/ CBT  Summary: Pt presented for individual therapy. Pt seemed anxious today. He had just come from one of his many dr appts, checking on his eyes. He may have eyelid surgery due to the previous cancer tx. His son, again, has been in [place] for self harm (2) incidents. He is being medically stabilized and will be returned home. Rediscussed his hopes for his son to be "normal" again and reports its hard to accept his son will have major mental health issues the rest of his life. Again followup with processing acceptance: of his son, of his daughter who lives with he and his wife and has no motivation and is allowed to lay in the bed all day, his wife's aloofness to the financial stress pt endures daily. Processed with pt what he is willing to do different to cause less stressors in his life and he already feels like the "bad guy" in the family. Pt is willing to try 1 boundary setting with his daughter who lays in bed. He reports it will be hard because his wife will not back him up. Pt feels hopeless at times that his life will ever change. Reminded pt it will have to be he who changes. Processed with pt coping tools to deal with his frustration, and anxiety, in his current life. He was receptive to suggestions during the intervention.  Suicidal/Homicidal: No  Therapist Response: Assessed pt's current functioning and reviewed progress. Assisted pt processing issues with family, feelings of hopelessness, boundaries, acceptance,  and processing for management of stressors.  Plan: Return in 2 weeks using CBT  Diagnosis: Axis 1: Bipolar 1 disorder,   [name], LCAS 10/15/16

## 2016-10-16 ENCOUNTER — Encounter (HOSPITAL_COMMUNITY): Payer: Self-pay | Admitting: Licensed Clinical Social Worker

## 2016-10-18 ENCOUNTER — Ambulatory Visit: Payer: Self-pay | Admitting: Internal Medicine

## 2016-10-18 ENCOUNTER — Telehealth: Payer: Self-pay | Admitting: Oncology

## 2016-10-18 NOTE — Telephone Encounter (Signed)
Patient called to cancel appointment. Will reschedule appointment at a later time.

## 2016-10-21 ENCOUNTER — Ambulatory Visit: Payer: Self-pay | Admitting: Oncology

## 2016-10-23 ENCOUNTER — Ambulatory Visit (INDEPENDENT_AMBULATORY_CARE_PROVIDER_SITE_OTHER): Payer: Managed Care, Other (non HMO) | Admitting: Pulmonary Disease

## 2016-10-23 ENCOUNTER — Encounter: Payer: Self-pay | Admitting: Pulmonary Disease

## 2016-10-23 VITALS — BP 140/98 | HR 83 | Ht 72.0 in | Wt 179.8 lb

## 2016-10-23 DIAGNOSIS — K219 Gastro-esophageal reflux disease without esophagitis: Secondary | ICD-10-CM

## 2016-10-23 DIAGNOSIS — J410 Simple chronic bronchitis: Secondary | ICD-10-CM

## 2016-10-23 DIAGNOSIS — D839 Common variable immunodeficiency, unspecified: Secondary | ICD-10-CM | POA: Diagnosis not present

## 2016-10-23 DIAGNOSIS — J479 Bronchiectasis, uncomplicated: Secondary | ICD-10-CM | POA: Diagnosis not present

## 2016-10-23 MED ORDER — PROMETHAZINE-CODEINE 6.25-10 MG/5ML PO SYRP: 5.0000 mL | ORAL_SOLUTION | ORAL | 0 refills | Status: DC | PRN

## 2016-10-23 NOTE — Addendum Note (Signed)
Addended by: Tyson Dense on: 10/23/2016 03:16 PM   Modules accepted: Orders

## 2016-10-23 NOTE — Progress Notes (Signed)
Subjective:    Patient ID: Alex [name], male    DOB: Jul 08, 1956, 61 y.o.   MRN: [id number]  C.C.:  Follow-up for Chronic Bronchitis/Mild Bronchiectasis, GERD, & CVID.  HPI Chronic bronchitis/mild bronchiectasis:  He reports he had a mild flare in January and was treated with Prednisone & Augmentin XR. He reports his cough is nearly nonexistent. No wheezing. He was started on Symbicort at last appointment but has been using it intermittently with increased symptoms. Reports he rarely needs to use his Xopenex inhaler. Still using Mucinex once daily. Not consistently using his Flutter Valve/Acapella. Patient reports he is rarely using his Codeine cough syrup at night when he is laying flat which can sometimes trigger his cough.   GERD:  Follows with Dr. Oletta Lamas (GI). Previously on OTC Prilosec. No reflux, dyspepsia, or morning brash water taste.   CVID:  Following with allergy & immunology on replacement therapy. No new problems.   Review of Systems  No fever, chills, or sweats. No chest pain, pressure, or palpitations. No rashes or bruising.   Allergies  Allergen Reactions  . Percocet [Oxycodone-Acetaminophen] Itching    Can take generic.  States only has a problem with percocet brand    Current Outpatient Prescriptions on File Prior to Visit  Medication Sig Dispense Refill  . ALPHAGAN P 0.1 % SOLN Place 1 drop into both eyes 2 (two) times daily.     [name] aspirin EC 81 MG tablet Take 1 tablet by mouth daily.    [name] atorvastatin (LIPITOR) 40 MG tablet TAKE 1 TABLET (40 MG TOTAL) BY MOUTH DAILY. 90 tablet 3  . AXIRON 30 MG/ACT SOLN Place 2 Act onto the skin daily.    . budesonide-formoterol (SYMBICORT) 80-4.5 MCG/ACT inhaler Inhale 2 puffs into the lungs 2 (two) times daily. 1 Inhaler 0  . Coenzyme Q10 (CO Q 10 PO) Take by mouth daily.    [name] dicyclomine (BENTYL) 20 MG tablet TAKE 1 TABLET 3 TIMES A DAY AS NEEDED  6  . fluconazole (DIFLUCAN) 150 MG tablet Take 1 tablet (150 mg total) by  mouth once. Reported on 08/04/2015 4 tablet 2  . Immune Globulin, Human, (HIZENTRA) 4 GM/20ML SOLN Inject into the skin once a week. wednesday    . LamoTRIgine 300 MG TB24 Take 1 tablet (300 mg total) by mouth daily. 90 tablet 0  . levalbuterol (XOPENEX HFA) 45 MCG/ACT inhaler Inhale 2 puffs into the lungs every 6 (six) hours as needed for wheezing. 1 Inhaler 5  . levothyroxine (SYNTHROID) 150 MCG tablet Take 1 tablet (150 mcg total) by mouth daily. 90 tablet 3  . lidocaine-prilocaine (EMLA) cream as needed.    [name] LORazepam (ATIVAN) 0.5 MG tablet Take 1 tab daily as needed and 1 tab at bed time 60 tablet 2  . LUTEIN PO Take by mouth daily.    . OMEGA-3 KRILL OIL PO Take by mouth daily.    . promethazine-codeine (PHENERGAN WITH CODEINE) 6.25-10 MG/5ML syrup Take 5 mLs by mouth every 4 (four) hours as needed. 300 mL 0  . RABEprazole (ACIPHEX) 20 MG tablet TAKE 1 TABLET BY MOUTH EVERY DAY--  takes in pm    . Respiratory Therapy Supplies (FLUTTER) DEVI Use as directed 1 each 0  . Saw Palmetto, Serenoa repens, (SAW PALMETTO PO) Take by mouth.    . Spacer/Aero-Holding Chambers (AEROCHAMBER MV) inhaler Use as instructed 1 each 0  . sucralfate (CARAFATE) 1 G tablet Take 1 g by mouth as needed.  3  . SYNTHROID 150 MCG tablet TAKE 1 TABLET BY MOUTH EVERY DAY 90 tablet 3  . telmisartan (MICARDIS) 80 MG tablet Take 1 tablet (80 mg total) by mouth daily. 90 tablet 3  . zolpidem (AMBIEN) 10 MG tablet Take 1 tablet (10 mg total) by mouth at bedtime as needed. for sleep 30 tablet 1  . benzonatate (TESSALON) 100 MG capsule Take 1-2 capsules (100-200 mg total) by mouth 3 (three) times daily as needed for cough. (Patient not taking: Reported on 10/23/2016) 90 capsule 1  . tadalafil (CIALIS) 20 MG tablet Take 1 tablet (20 mg total) by mouth daily as needed for erectile dysfunction. 30 tablet 3   No current facility-administered medications on file prior to visit.     Past Medical History:  Diagnosis Date  .  Anxiety   . At risk for sleep apnea    STOP-BANG= 5    SENT TO PCP 04-22-2014  . Bipolar I disorder ([place])   . Bleeding ulcer 06/2014  . BPH (benign prostatic hypertrophy) with urinary obstruction   . Chronic back pain   . Complication of anesthesia POST URINARY RETENTION---  2006 SHOULDER SURGERY MARKED BRADYCARDIA VAGAL RESPONSE NO ISSUE W/ SURGERY AFTER THIS ONE  . Coronary atherosclerosis CARDIOLOGIST- DR CRENSHAW--  LAST VISIT 01-05-2012 IN EPIC   NON-OBSTRUCTIVE MILD DISEASE  . 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 Depression   . Epicondylitis    right elbow  . GERD (gastroesophageal reflux disease)   . Glaucoma BOTH EYES -- NO DROPS   . History of chronic prostatitis   . 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 History of thyroid cancer PRIMARY (NO METS FROM ORBITAL CANCER)--   IN REMISSION   S/P TOTAL THYROIDECTOMY  , CHEMORADIATION  (ONCOLOGIST -- DR Griffith Citron)  . Hyperlipidemia   . Hypertension   . Immune deficiency disorder ([place])    COMMON VARIABLE  . Nocturia   . OA (osteoarthritis)   . Positional vertigo   . Renal calculi LEFT KIDNEY-- NON-OBSTRUCTIVE  . Ulnar nerve compression    right elbow  . Urinary hesitancy     Past Surgical History:  Procedure Laterality Date  . CARDIAC CATHETERIZATION  01-16-2006  DR Marcello Moores WALL   MILD CORONARY ATHEROSCLEROSIS/ MID TO DISTAL LAD 40% STENOSIS/ LVF 50-55%  . KNEE ARTHROSCOPY  05/01/2012   Procedure: ARTHROSCOPY KNEE;  Surgeon: Johnn Hai, MD;  Location: [facility];  Service: Orthopedics;  Laterality: Left;  debridement and removal of loose body  . LEFT ANKLE ARTHROSCOPY W/ DEBRIDEMENT  05-12-2007  . LEFT HYDROCELECTOMY  03-29-2005   AND REPAIR LEFT INGUINAL HERNIA W/ MESH  . MOHS SURGERY  2002   RIGHT ORBITAL CANCER  . NASAL  ENDOSCOPY  08-07-2005   RIGHT EPISTAXIS  / POST SEPTOPLASTY  (HX RIGHT ORBITAL CA & S/P RADIATION/ NECROSIS ANTERIOR END OF BOTH INFERIOR TURBINATES)  . occuloplastic surgery  2002  . PARS PLANA VITRECTOMY  11-06-2004   RIGHT EYE RADIATION RETINOPATHY W/ HEMORRHAGE  . REPAIR UNDESENDED RIGHT TESTICLE / RIGHT INGUINAL HERNIA  AGE 20  . RIGHT ANKLE ARTHROSCOPY W/ EXTENSIVE DEBRIDEMENT  04-05-2008   . RIGHT SUPRAOMOHYOID NECK DISSECTION   03-08-2003   ZONES 1,2,3;   SUBMANDIBULAR MASS /  METASTATIC SQUAMOUS CELL CARCINOMA RIGHT NECK  . SEPTOPLASTY  NOV 2006  . SHOULDER ARTHROSCOPY W/ SUBACROMIAL DECOMPRESSION AND DISTAL CLAVICLE EXCISION  10-09-2008   AND DEBRIDEMENT OF RIGHT SHOULDER IMPINGEMENT & [facility] JOINT ARTHRITIS  . SPINE SURGERY    . TOTAL THYROIDECTOMY  11-03-2001   PAPILLARY THYROID CARCINOMA  . TRANSTHORACIC ECHOCARDIOGRAM  12/ 2012   grade I diastolic dysfunction/ ef 66-59%  . ULNAR NERVE TRANSPOSITION Right 04/28/2014   Procedure: RIGHT ELBOW ULNA NERVE RELEASE TRANSPOSTION AND MEDIAL EPICONDYLAR DEBRIDEMENT AND REPAIR;  Surgeon: Linna Hoff, MD;  Location: [place];  Service: Orthopedics;  Laterality: Right;    Family History  Problem Relation Age of Onset  . Depression Sister   . Rectal cancer Sister   . Lung cancer Brother   . Kidney disease Mother   . Depression Mother   . Stroke Father   . COPD Father   . Hypertension Father   . Prostate cancer Father   . Depression Daughter   . Drug abuse Daughter   . Psychiatric Illness Son     Social History   Social History  . Marital status: Married    Spouse name: N/A  . Number of children: N/A  . Years of education: N/A   Social History Main Topics  . Smoking status: Never Smoker  . Smokeless tobacco: Never Used  . Alcohol use 0.0 oz/week     Comment: 1-2 glasses per month  . Drug use: No  . Sexual activity: Not Asked   Other Topics Concern  . None   Social History Narrative   Originally  from [place]. Moved to [place] in 1984. 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 No international travel other than [place]. Has a dog at home, poodle mix. His daughter who lives with him now has a dog. No mold exposure. No bird exposure. No hot tub exposure. Enjoys gardening.       Objective:   Physical Exam BP (!) 140/98 (BP Location: Left Arm, Patient Position: Sitting, Cuff Size: Normal)   Pulse 83   Ht 6' (1.829 m)   Wt 179 lb 12.8 oz (81.6 kg)   SpO2 97%   BMI 24.39 kg/m   Gen.: No distress. Comfortable. Alert. Integument: Warm and dry. Without rash or bruising on exposed skin. HEENT: Bilateral scleral injection unchanged. No oral ulcers. Moist mucous membranes. Pulmonary: Clear with auscultation bilaterally. No accessory muscle use on room air. Cardiovascular: Regular rate. No edema. Regular rhythm. Abdomen: Soft. Nondistended. Normal bowel sounds.  PFT 04/22/16: FVC 5.09 L (103%) FEV1 3.95 L (106%) FEV1/FVC 0.78 FEF 25-75 3.47 L (116%) negative bronchodilator response TLC 7.50 L (103%) RV 112% ERV 98% DLCO corrected 99% (Hgb 15.0)  6MWT 04/22/16:  Walked 483 meters / Baseline Sat 98% on RA / Nadir Sat 97% on RA  IMAGING BARIUM SWALLOW 03/19/16 (per radiologist): Narrowed lower cervical esophagus possibly due to prior radiation treatment as well as prominent cricopharyngeus muscle. Tiny sliding-type hiatal hernia. Moderate gastroesophageal reflux. Moderate tertiary contractions.  HRCT CHEST 03/19/16 (previously reviewed by me): No lung opacity or intralobular septal thickening to suggest interstitial lung disease. Mild bronchiectasis noted. No pulmonary nodule or mass appreciated. No pleural effusion or thickening. No pathologic mediastinal adenopathy. No pericardial effusion.  PORT CXR 03/03/16 (per radiologist): Heart normal in size. Lungs clear. No  acute  disease.  CXR PA/LAT 11/10/15 (previously reviewed by me): No focal opacity or mass appreciated. No pleural effusion. Heart normal in size & mediastinum normal in contour.  MICROBIOLOGY Sputum Ctx (07/18/16):  Abundant Moraxella catarrhalis / AFB negative / Fungus negative  Sputum Ctx (04/04/16):  Oral Flora / Light yeast  Sputum Ctx (03/12/16):  AFB negative  LABS 04/19/16 CBC: 13.3/16.5/48.2/224 Ferritin: 26  03/03/16 CBC: 10.1/15.5/46.0/288 BMP: 140/4.1/107/25/24/1.1/105/8.7  3/10/8 RF:  <20.0    Assessment & Plan:  61 y.o. male with chronic bronchitis/mild bronchiectasis, GERD, & CVID.  The patient seems to be doing well with symptom control from his underlying chronic bronchitis/bronchiectasis. His reflux seems to be well controlled and with continued infusions for his common variable immunodeficiency I feel overall his pulmonary function is remaining stable. Especially in light of his limited exacerbations. I instructed the patient contact my office if he had any new breathing problems or questions before his next appointment as I would be happy to see him sooner.  1. Chronic bronchitis/mild bronchiectasis:  Refilled patient's Codeine cough syrup to use sparingly. Encouraged him to continue regular use of his airway clearance and inhaler medications.  2. GERD:  Continuing Prilosec. No changes.  3. CVID:  Continuing to follow with Allergy/Immunology.  4. Health maintenance:  S/P Influenza September 2017, Prevnar April 2015 & Pneumovax 03 June 2012.  5. Follow-up:  Return to clinic in 6 months or sooner if needed.   Sonia Baller Ashok Cordia, [name] [facility]monary [facility] Critical Care Pager:  [phone] After 3pm or if no response, call [phone] 2:57 PM 10/23/16

## 2016-10-23 NOTE — Patient Instructions (Signed)
   Keep taking your medications as prescribed.  Call me if you have any new breathing problems or your cough becomes more productive.  I will see you back in 6 months or sooner if needed.

## 2016-10-29 ENCOUNTER — Ambulatory Visit (INDEPENDENT_AMBULATORY_CARE_PROVIDER_SITE_OTHER): Payer: Managed Care, Other (non HMO) | Admitting: Licensed Clinical Social Worker

## 2016-10-29 DIAGNOSIS — F319 Bipolar disorder, unspecified: Secondary | ICD-10-CM | POA: Diagnosis not present

## 2016-10-30 ENCOUNTER — Encounter (HOSPITAL_COMMUNITY): Payer: Self-pay | Admitting: Licensed Clinical Social Worker

## 2016-10-30 NOTE — Progress Notes (Signed)
Therapist Progress Note   Time: 4:10-5:00 pm  Participation Level: Active  Behavioral Response: Casual, Alert, Anxious  Type of Therapy: Individual Therapy  Treatment Goals Addressed: Coping  Interventions: Supportive/ CBT  Summary: Pt presented for individual therapy. Pt seemed anxious today.  His son, again, has been in [place] for self harm (2) incidents. He is waiting for a bed at [facility]. He had to have surgery due to his self harm. Rediscussed his hopes for his son to be "normal" again and reports its hard to accept his son will have major mental health issues the rest of his life. Again followup with processing acceptance: of his son, of his daughter who lives with he and his wife and has no motivation and is allowed to lay in the bed all day. Pt is ready to give up on his son and still wants to have hope. Processed with pt his feelings of hope. Pt finally used his boundaries with his daughter and did not pay her car insurance. Congratulated pt on this. Pt is going to have to use more boundaries in the near future because his financial situation is going to change again. Processed with pt his feelings of hopelessness that his life will ever change. Reminded pt it will have to be he who changes. Processed with pt coping tools to deal with his frustration, and anxiety, in his current life. He was receptive to suggestions during the intervention.  Suicidal/Homicidal: No  Therapist Response: Assessed pt's current functioning and reviewed progress. Assisted pt processing issues with family, feelings of hopelessness, boundaries, acceptance,  and processing for management of stressors.  Plan: Return in 2 weeks using CBT  Diagnosis: Axis 1: Bipolar 1 disorder,   [name], LCAS 10/29/16

## 2016-11-08 ENCOUNTER — Encounter: Payer: Self-pay | Admitting: Cardiology

## 2016-11-11 ENCOUNTER — Telehealth: Payer: Self-pay | Admitting: Pulmonary Disease

## 2016-11-11 ENCOUNTER — Ambulatory Visit (INDEPENDENT_AMBULATORY_CARE_PROVIDER_SITE_OTHER): Payer: Managed Care, Other (non HMO) | Admitting: Internal Medicine

## 2016-11-11 ENCOUNTER — Encounter: Payer: Self-pay | Admitting: Internal Medicine

## 2016-11-11 ENCOUNTER — Telehealth: Payer: Self-pay

## 2016-11-11 ENCOUNTER — Ambulatory Visit (INDEPENDENT_AMBULATORY_CARE_PROVIDER_SITE_OTHER)
Admission: RE | Admit: 2016-11-11 | Discharge: 2016-11-11 | Disposition: A | Discharge status: Home / Self Care | Payer: Managed Care, Other (non HMO) | Source: Ambulatory Visit | Attending: Internal Medicine | Admitting: Internal Medicine

## 2016-11-11 VITALS — BP 162/94 | HR 98 | Temp 97.6°F | Wt 179.0 lb

## 2016-11-11 DIAGNOSIS — R0789 Other chest pain: Secondary | ICD-10-CM | POA: Diagnosis not present

## 2016-11-11 DIAGNOSIS — W19XXXA Unspecified fall, initial encounter: Secondary | ICD-10-CM | POA: Diagnosis not present

## 2016-11-11 DIAGNOSIS — S0091XA Abrasion of unspecified part of head, initial encounter: Secondary | ICD-10-CM | POA: Insufficient documentation

## 2016-11-11 DIAGNOSIS — R079 Chest pain, unspecified: Secondary | ICD-10-CM

## 2016-11-11 DIAGNOSIS — W108XXA Fall (on) (from) other stairs and steps, initial encounter: Secondary | ICD-10-CM | POA: Diagnosis not present

## 2016-11-11 MED ORDER — KETOROLAC TROMETHAMINE 60 MG/2ML IM SOLN
60.0000 mg | Freq: Once | INTRAMUSCULAR | Status: AC
  Administered 2016-11-11: 60 mg via INTRAMUSCULAR

## 2016-11-11 NOTE — Assessment & Plan Note (Signed)
No LOC L rib fx #6 Abrasions - cleaned and dressed

## 2016-11-11 NOTE — Patient Instructions (Signed)
Rib Fracture A rib fracture is a break or crack in one of the bones of the ribs. The ribs are a group of long, curved bones that wrap around your chest and attach to your spine. They protect your lungs and other organs in the chest cavity. A broken or cracked rib is often painful, but most do not cause other problems. Most rib fractures heal on their own over time. However, rib fractures can be more serious if multiple ribs are broken or if broken ribs move out of place and push against other structures. What are the causes?  A direct blow to the chest. For example, this could happen during contact sports, a car accident, or a fall against a hard object.  Repetitive movements with high force, such as pitching a baseball or having severe coughing spells. What are the signs or symptoms?  Pain when you breathe in or cough.  Pain when someone presses on the injured area. How is this diagnosed? Your caregiver will perform a physical exam. Various imaging tests may be ordered to confirm the diagnosis and to look for related injuries. These tests may include a chest X-ray, computed tomography (CT), magnetic resonance imaging (MRI), or a bone scan. How is this treated? Rib fractures usually heal on their own in 1-3 months. The longer healing period is often associated with a continued cough or other aggravating activities. During the healing period, pain control is very important. Medication is usually given to control pain. Hospitalization or surgery may be needed for more severe injuries, such as those in which multiple ribs are broken or the ribs have moved out of place. Follow these instructions at home:  Avoid strenuous activity and any activities or movements that cause pain. Be careful during activities and avoid bumping the injured rib.  Gradually increase activity as directed by your caregiver.  Only take over-the-counter or prescription medications as directed by your caregiver. Do not take  other medications without asking your caregiver first.  Apply ice to the injured area for the first 1-2 days after you have been treated or as directed by your caregiver. Applying ice helps to reduce inflammation and pain.  Put ice in a plastic bag.  Place a towel between your skin and the bag.  Leave the ice on for 15-20 minutes at a time, every 2 hours while you are awake.  Perform deep breathing as directed by your caregiver. This will help prevent pneumonia, which is a common complication of a broken rib. Your caregiver may instruct you to:  Take deep breaths several times a day.  Try to cough several times a day, holding a pillow against the injured area.  Use a device called an incentive spirometer to practice deep breathing several times a day.  Drink enough fluids to keep your urine clear or pale yellow. This will help you avoid constipation.  Do not wear a rib belt or binder. These restrict breathing, which can lead to pneumonia. Get help right away if:  You have a fever.  You have difficulty breathing or shortness of breath.  You develop a continual cough, or you cough up thick or bloody sputum.  You feel sick to your stomach (nausea), throw up (vomit), or have abdominal pain.  You have worsening pain not controlled with medications. This information is not intended to replace advice given to you by your health care provider. Make sure you discuss any questions you have with your health care provider. Document Released: 07/29/2005 Document   Revised: 01/04/2016 Document Reviewed: 09/30/2012 Elsevier Interactive Patient Education  2017 [employer] Rib Belt Rib belts can be used to help control the pain from a chest injury. The belt gives gentle support to the injured area. It also helps reduce chest motion. What are the risks? Rib belts can increase the risk of getting pneumonia after a chest injury. You must be sure to breathe deeply and cough several times every hour  to keep your lungs clear. Do not use a rib belt if it does not help relieve your pain. How to apply the rib belt Wear the rib belt only as directed by your health care provider. Take the belt off at night when you go to bed. 1. Place the belt across your back and stretch the ends out and forward across your rib cage. 2. Press the velcro areas together when the belt is putting gentle pressure on your chest wall. Do not make the rib belt too tight. Make sure you can breathe comfortably. Contact a health care provider if: You have a fever. Get help right away if:  You develop pus-like sputum or an uncontrolled cough.  You begin coughing up blood.  You have pain that is getting worse or is not controlled with medicines. This information is not intended to replace advice given to you by your health care provider. Make sure you discuss any questions you have with your health care provider. Document Released: 09/05/2004 Document Revised: 01/04/2016 Document Reviewed: 12/14/2013 Elsevier Interactive Patient Education  2017 [employer].

## 2016-11-11 NOTE — Progress Notes (Signed)
Subjective:  Patient ID: Alex [name], male    DOB: 06-06-1956  Age: 61 y.o. MRN: [id number]  CC: Head Injury (Pt injured his head, left knee, left side of the chest is painful )   HPI [name] presents for a fall today off the steps outside - tripped Hit L knee, R hand L forehead.   C/o L CP w/breathing No LOC, no n/v Called 911 - advised ER but refused and walked in here  Outpatient Medications Prior to Visit  Medication Sig Dispense Refill  . ALPHAGAN P 0.1 % SOLN Place 1 drop into both eyes 2 (two) times daily.     [name] aspirin EC 81 MG tablet Take 1 tablet by mouth daily.    [name] atorvastatin (LIPITOR) 40 MG tablet TAKE 1 TABLET (40 MG TOTAL) BY MOUTH DAILY. 90 tablet 3  . AXIRON 30 MG/ACT SOLN Place 2 Act onto the skin daily.    . benzonatate (TESSALON) 100 MG capsule Take 1-2 capsules (100-200 mg total) by mouth 3 (three) times daily as needed for cough. 90 capsule 1  . budesonide-formoterol (SYMBICORT) 80-4.5 MCG/ACT inhaler Inhale 2 puffs into the lungs 2 (two) times daily. 1 Inhaler 0  . Coenzyme Q10 (CO Q 10 PO) Take by mouth daily.    [name] dicyclomine (BENTYL) 20 MG tablet TAKE 1 TABLET 3 TIMES A DAY AS NEEDED  6  . fluconazole (DIFLUCAN) 150 MG tablet Take 1 tablet (150 mg total) by mouth once. Reported on 08/04/2015 4 tablet 2  . Immune Globulin, Human, (HIZENTRA) 4 GM/20ML SOLN Inject into the skin once a week. wednesday    . LamoTRIgine 300 MG TB24 Take 1 tablet (300 mg total) by mouth daily. 90 tablet 0  . levalbuterol (XOPENEX HFA) 45 MCG/ACT inhaler Inhale 2 puffs into the lungs every 6 (six) hours as needed for wheezing. 1 Inhaler 5  . levothyroxine (SYNTHROID) 150 MCG tablet Take 1 tablet (150 mcg total) by mouth daily. 90 tablet 3  . lidocaine-prilocaine (EMLA) cream as needed.    [name] LORazepam (ATIVAN) 0.5 MG tablet Take 1 tab daily as needed and 1 tab at bed time 60 tablet 2  . LUTEIN PO Take by mouth daily.    . OMEGA-3 KRILL OIL PO Take by mouth daily.      . promethazine-codeine (PHENERGAN WITH CODEINE) 6.25-10 MG/5ML syrup Take 5 mLs by mouth every 4 (four) hours as needed. 180 mL 0  . RABEprazole (ACIPHEX) 20 MG tablet TAKE 1 TABLET BY MOUTH EVERY DAY--  takes in pm    . Respiratory Therapy Supplies (FLUTTER) DEVI Use as directed 1 each 0  . Saw Palmetto, Serenoa repens, (SAW PALMETTO PO) Take by mouth.    . Spacer/Aero-Holding Chambers (AEROCHAMBER MV) inhaler Use as instructed 1 each 0  . sucralfate (CARAFATE) 1 G tablet Take 1 g by mouth as needed.   3  . SYNTHROID 150 MCG tablet TAKE 1 TABLET BY MOUTH EVERY DAY 90 tablet 3  . telmisartan (MICARDIS) 80 MG tablet Take 1 tablet (80 mg total) by mouth daily. 90 tablet 3  . zolpidem (AMBIEN) 10 MG tablet Take 1 tablet (10 mg total) by mouth at bedtime as needed. for sleep 30 tablet 1  . tadalafil (CIALIS) 20 MG tablet Take 1 tablet (20 mg total) by mouth daily as needed for erectile dysfunction. 30 tablet 3   No facility-administered medications prior to visit.     ROS Review of Systems  Constitutional:  Negative for appetite change, fatigue and unexpected weight change.  HENT: Negative for congestion, nosebleeds, sneezing, sore throat and trouble swallowing.   Eyes: Negative for itching and visual disturbance.  Respiratory: Negative for cough and chest tightness.   Cardiovascular: Positive for chest pain. Negative for palpitations and leg swelling.  Gastrointestinal: Negative for abdominal distention, blood in stool, diarrhea, nausea and vomiting.  Genitourinary: Negative for frequency and hematuria.  Musculoskeletal: Negative for back pain, gait problem, joint swelling and neck pain.  Skin: Positive for wound. Negative for rash.  Neurological: Negative for dizziness, tremors, speech difficulty, weakness and headaches.  Psychiatric/Behavioral: Negative for agitation, dysphoric mood, sleep disturbance and suicidal ideas. The patient is not nervous/anxious.     Objective:  BP (!) 162/94    Pulse 98   Temp 97.6 F (36.4 C)   Wt 179 lb (81.2 kg)   SpO2 98%   BMI 24.28 kg/m   BP Readings from Last 3 Encounters:  11/11/16 (!) 162/94  10/23/16 (!) 140/98  08/30/16 (!) 160/108    Wt Readings from Last 3 Encounters:  11/11/16 179 lb (81.2 kg)  10/23/16 179 lb 12.8 oz (81.6 kg)  08/30/16 182 lb (82.6 kg)    Physical Exam  Constitutional: He is oriented to person, place, and time. He appears well-developed. No distress.  NAD  HENT:  Mouth/Throat: Oropharynx is clear and moist.  Eyes: Conjunctivae are normal. Pupils are equal, round, and reactive to light.  Neck: Normal range of motion. No JVD present. No thyromegaly present.  Cardiovascular: Normal rate, regular rhythm, normal heart sounds and intact distal pulses.  Exam reveals no gallop and no friction rub.   No murmur heard. Pulmonary/Chest: Effort normal and breath sounds normal. No respiratory distress. He has no wheezes. He has no rales. He exhibits tenderness.  Abdominal: Soft. Bowel sounds are normal. He exhibits no distension and no mass. There is no tenderness. There is no rebound and no guarding.  Musculoskeletal: Normal range of motion. He exhibits no edema or tenderness.  Lymphadenopathy:    He has no cervical adenopathy.  Neurological: He is alert and oriented to person, place, and time. He has normal reflexes. No cranial nerve deficit. He exhibits normal muscle tone. He displays a negative Romberg sign. Coordination and gait normal.  Skin: Skin is warm and dry. No rash noted. There is erythema.  Psychiatric: He has a normal mood and affect. His behavior is normal. Judgment and thought content normal.  L antero-lat chest is tender L forehead abrasion 2.5x2 cm L knee abrasions x2 A/o/c  Lab Results  Component Value Date   WBC 11.9 (H) 07/23/2016   HGB 16.3 07/23/2016   HCT 47.9 07/23/2016   PLT 210 07/23/2016   GLUCOSE 82 07/23/2016   CHOL 151 07/23/2016   TRIG 175 (H) 07/23/2016   HDL 74  07/23/2016   LDLDIRECT 59.0 02/02/2016   LDLCALC 42 07/23/2016   ALT 40 07/23/2016   AST 24 07/23/2016   NA 139 07/23/2016   K 3.7 07/23/2016   CL 101 07/23/2016   CREATININE 1.03 07/23/2016   BUN 20 07/23/2016   CO2 27 07/23/2016   TSH 3.50 07/23/2016   PSA 1.5 07/23/2016   HGBA1C 6.1 08/04/2012    Dg Ribs Unilateral Left  Result Date: 11/11/2016 CLINICAL DATA:  Fall.  Left flank pain .  Left rib pain . EXAM: LEFT RIBS - 2 VIEW COMPARISON:  CT 03/19/2016.  Chest x-ray 11/10/2015 . FINDINGS: Surgical clips are noted over  the neck. Subtle nondisplaced fracture of the left anterolateral sixth rib cannot be excluded. Old left second, third, and tenth rib fractures. Prominent degenerative changes and scoliosis thoracic spine. Prior lumbar spine fusion. IMPRESSION: 1. Left anterolateral sixth rib nondisplaced fracture cannot be excluded . Old left second, third, and tenth rib fractures. 2. Thoracic spine scoliosis and degenerative change. Prior lumbar spine fusion Electronically Signed   By: Marcello Moores  Register   On: 11/11/2016 15:38    Assessment & Plan:   There are no diagnoses linked to this encounter. I am having Mr. Marti maintain his Immune Globulin (Human), Coenzyme Q10 (CO Q 10 PO), OMEGA-3 KRILL OIL PO, RABEprazole, sucralfate, AXIRON, ALPHAGAN P, lidocaine-prilocaine, aspirin EC, atorvastatin, dicyclomine, fluconazole, telmisartan, benzonatate, (Saw Palmetto, Serenoa repens, (SAW PALMETTO PO)), LUTEIN PO, FLUTTER, tadalafil, AEROCHAMBER MV, budesonide-formoterol, SYNTHROID, levalbuterol, LamoTRIgine, zolpidem, levothyroxine, LORazepam, and promethazine-codeine.  No orders of the defined types were placed in this encounter.    Follow-up: No Follow-up on file.  Walker Kehr, MD

## 2016-11-11 NOTE — Telephone Encounter (Signed)
error 

## 2016-11-11 NOTE — Assessment & Plan Note (Signed)
Dressed the wound No LOC Call if HA, n/v etc CT discussed - plain CT if problems. Otherwise - he is getting head/neck CT at the request of his plastic surgeon this week

## 2016-11-11 NOTE — Telephone Encounter (Signed)
Called spoke with patient who reported a fall earlier today - called 911 but instead of going to the ER pt saw PCP Dr Scarlette Calico.  cxr in epic > R rib fracture  Pt asked for any precautions while healing.  Advised pt to continue his maintenance inhalers, flutter valve, deep breathing to help prevent pneumonia and to advance activity as tolerated - we do recommend to 'take it easy' while you rest and recuperate but do not become immobile.  Advised pt to watch for any signs/symptoms of exacerbation with increased cough, congestion, dyspnea, wheezing, tightness, hemoptysis.    Pt voiced his understanding JN are there any other recommendations for patient?  Thank you!

## 2016-11-12 ENCOUNTER — Ambulatory Visit (HOSPITAL_COMMUNITY): Payer: Self-pay | Admitting: Licensed Clinical Social Worker

## 2016-11-12 NOTE — Telephone Encounter (Signed)
Agree with all recommendations. Nothing new. Thank you.

## 2016-11-12 NOTE — Addendum Note (Signed)
Addended by: Aviva Signs M on: 11/12/2016 11:05 AM   Modules accepted: Orders

## 2016-11-12 NOTE — Progress Notes (Deleted)
Therapist Progress Note   Time: 4:10-5:00 pm  Participation Level: Active  Behavioral Response: Casual, Alert, Anxious  Type of Therapy: Individual Therapy  Treatment Goals Addressed: Coping  Interventions: Supportive/ CBT  Summary: Pt presented for individual therapy. Pt seemed anxious today.  His son, again, has been in [place] for self harm (2) incidents. He is waiting for a bed at [facility]. He had to have surgery due to his self harm. Rediscussed his hopes for his son to be "normal" again and reports its hard to accept his son will have major mental health issues the rest of his life. Again followup with processing acceptance: of his son, of his daughter who lives with he and his wife and has no motivation and is allowed to lay in the bed all day. Pt is ready to give up on his son and still wants to have hope. Processed with pt his feelings of hope. Pt finally used his boundaries with his daughter and did not pay her car insurance. Congratulated pt on this. Pt is going to have to use more boundaries in the near future because his financial situation is going to change again. Processed with pt his feelings of hopelessness that his life will ever change. Reminded pt it will have to be he who changes. Processed with pt coping tools to deal with his frustration, and anxiety, in his current life. He was receptive to suggestions during the intervention.  Suicidal/Homicidal: No  Therapist Response: Assessed pt's current functioning and reviewed progress. Assisted pt processing issues with family, feelings of hopelessness, boundaries, acceptance,  and processing for management of stressors.  Plan: Return in 2 weeks using CBT  Diagnosis: Axis 1: Bipolar 1 disorder,   [name], LCAS 10/29/16

## 2016-11-12 NOTE — Telephone Encounter (Signed)
Pt is aware of JN below message and voiced his understanding. Nothing further needed.

## 2016-11-15 ENCOUNTER — Other Ambulatory Visit: Payer: Self-pay | Admitting: *Deleted

## 2016-11-15 DIAGNOSIS — C6960 Malignant neoplasm of unspecified orbit: Secondary | ICD-10-CM

## 2016-11-15 DIAGNOSIS — R42 Dizziness and giddiness: Secondary | ICD-10-CM

## 2016-11-20 ENCOUNTER — Other Ambulatory Visit: Payer: Self-pay | Admitting: *Deleted

## 2016-11-25 ENCOUNTER — Ambulatory Visit (HOSPITAL_COMMUNITY): Payer: Self-pay | Admitting: Licensed Clinical Social Worker

## 2016-11-28 ENCOUNTER — Telehealth: Payer: Self-pay

## 2016-11-28 NOTE — Telephone Encounter (Signed)
Spoke with Mr [facility] by phone to inform him that all recent lab values were WNL and no need for iron infusion at this time.

## 2016-12-05 ENCOUNTER — Ambulatory Visit (HOSPITAL_COMMUNITY): Payer: Self-pay | Admitting: Psychiatry

## 2016-12-09 ENCOUNTER — Ambulatory Visit (INDEPENDENT_AMBULATORY_CARE_PROVIDER_SITE_OTHER): Payer: 59 | Admitting: Licensed Clinical Social Worker

## 2016-12-09 ENCOUNTER — Ambulatory Visit (HOSPITAL_COMMUNITY): Payer: Self-pay | Admitting: Psychiatry

## 2016-12-09 DIAGNOSIS — F319 Bipolar disorder, unspecified: Secondary | ICD-10-CM

## 2016-12-11 ENCOUNTER — Encounter (HOSPITAL_COMMUNITY): Payer: Self-pay | Admitting: Licensed Clinical Social Worker

## 2016-12-11 ENCOUNTER — Encounter (HOSPITAL_COMMUNITY): Payer: Self-pay | Admitting: Psychiatry

## 2016-12-11 ENCOUNTER — Ambulatory Visit (INDEPENDENT_AMBULATORY_CARE_PROVIDER_SITE_OTHER): Payer: 59 | Admitting: Psychiatry

## 2016-12-11 DIAGNOSIS — Z813 Family history of other psychoactive substance abuse and dependence: Secondary | ICD-10-CM

## 2016-12-11 DIAGNOSIS — F3162 Bipolar disorder, current episode mixed, moderate: Secondary | ICD-10-CM | POA: Diagnosis not present

## 2016-12-11 DIAGNOSIS — Z818 Family history of other mental and behavioral disorders: Secondary | ICD-10-CM | POA: Diagnosis not present

## 2016-12-11 DIAGNOSIS — F319 Bipolar disorder, unspecified: Secondary | ICD-10-CM

## 2016-12-11 MED ORDER — ZOLPIDEM TARTRATE 10 MG PO TABS: 10.0000 mg | ORAL_TABLET | Freq: Every evening | ORAL | 1 refills | Status: DC | PRN

## 2016-12-11 MED ORDER — LORAZEPAM 0.5 MG PO TABS: ORAL_TABLET | ORAL | 2 refills | Status: DC

## 2016-12-11 MED ORDER — LAMOTRIGINE ER 300 MG PO TB24: 1.0000 | ORAL_TABLET | Freq: Every day | ORAL | 0 refills | Status: DC

## 2016-12-11 NOTE — Progress Notes (Signed)
Result he saw [facility] MD/PA/NP OP Progress Note  12/11/2016 12:15 PM Alex [name]  MRN:  [id number]  Chief Complaint:  Subjective:  I'm doing so-so.  I'm seeing Alex [name] and she is helping me.  HPI: Alex [name] came for his follow-up appointment.  He is taking his medication as prescribed.  His family situation is not change.  His son remains in [facility] after he took razor blade.  Patient is started seeing Alex [name] in this office for counseling.  He's taking Lamictal, lorazepam as needed and Ambien at bedtime.  He is upset on his daughter who quit her job and not helping financially.  However despite chronic family issues he is doing better.  He denies any mania, psychosis, hallucination or any paranoia.  He denies any suicidal thoughts or any self abusive behavior.  His energy level is okay.  He denies any drug use.  He wants to continue his current psychiatric medication.  Visit Diagnosis:    ICD-9-CM ICD-10-CM   1. Bipolar 1 disorder (HCC) 296.7 F31.9 LORazepam (ATIVAN) 0.5 MG tablet  2. Bipolar I disorder (HCC) 296.7 F31.9 LamoTRIgine 300 MG TB24  3. Bipolar 1 disorder, mixed, moderate (HCC) 296.62 F31.62 zolpidem (AMBIEN) 10 MG tablet    Past Psychiatric History: Reviewed. Patient has been seeing in this office since 2009.  He has history of depression mood swing and anger.  He's been admitted to behavioral [place] due to suicidal thoughts but he denies any suicidal attempt.  He denies any psychosis but admitted poor impulse control.  In the past he has taken Seroquel and Cymbalta.    Past Medical History:  Past Medical History:  Diagnosis Date  . Anxiety   . At risk for sleep apnea    STOP-BANG= 5    SENT TO PCP 04-22-2014  . Bipolar I disorder ([place])   . Bleeding ulcer 06/2014  . BPH (benign prostatic hypertrophy) with urinary obstruction   . Chronic back pain   . Complication of anesthesia POST URINARY RETENTION---  2006 SHOULDER SURGERY MARKED BRADYCARDIA VAGAL RESPONSE  NO ISSUE W/ SURGERY AFTER THIS ONE  . Coronary atherosclerosis CARDIOLOGIST- DR CRENSHAW--  LAST VISIT 01-05-2012 IN EPIC   NON-OBSTRUCTIVE MILD DISEASE  . 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 Depression   . Epicondylitis    right elbow  . GERD (gastroesophageal reflux disease)   . Glaucoma BOTH EYES -- NO DROPS   . History of chronic prostatitis   . 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 History of thyroid cancer PRIMARY (NO METS FROM ORBITAL CANCER)--   IN REMISSION   S/P TOTAL THYROIDECTOMY  , CHEMORADIATION  (ONCOLOGIST -- DR Griffith Citron)  . Hyperlipidemia   . Hypertension   . 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 Nocturia   . OA (osteoarthritis)   . Positional vertigo   . Renal calculi LEFT KIDNEY-- NON-OBSTRUCTIVE  . Ulnar nerve compression    right elbow  . Urinary hesitancy     Past Surgical History:  Procedure Laterality Date  . CARDIAC CATHETERIZATION  01-16-2006  DR Marcello Moores WALL   MILD CORONARY ATHEROSCLEROSIS/ MID TO DISTAL LAD 40% STENOSIS/ LVF 50-55%  . KNEE ARTHROSCOPY  05/01/2012   Procedure: ARTHROSCOPY KNEE;  Surgeon:  Johnn Hai, MD;  [facility];  Service: Orthopedics;  Laterality: Left;  debridement and removal of loose body  . LEFT ANKLE ARTHROSCOPY W/ DEBRIDEMENT  05-12-2007  . LEFT HYDROCELECTOMY  03-29-2005   AND REPAIR LEFT INGUINAL HERNIA W/ MESH  . MOHS SURGERY  2002   RIGHT ORBITAL CANCER  . NASAL ENDOSCOPY  08-07-2005   RIGHT EPISTAXIS  / POST SEPTOPLASTY  (HX RIGHT ORBITAL CA & S/P RADIATION/ NECROSIS ANTERIOR END OF BOTH INFERIOR TURBINATES)  . occuloplastic surgery  2002  . PARS PLANA VITRECTOMY  11-06-2004   RIGHT EYE RADIATION RETINOPATHY W/ HEMORRHAGE  . REPAIR UNDESENDED RIGHT TESTICLE / RIGHT INGUINAL HERNIA  AGE 59  .  RIGHT ANKLE ARTHROSCOPY W/ EXTENSIVE DEBRIDEMENT  04-05-2008   . RIGHT SUPRAOMOHYOID NECK DISSECTION   03-08-2003   ZONES 1,2,3;   SUBMANDIBULAR MASS / METASTATIC SQUAMOUS CELL CARCINOMA RIGHT NECK  . SEPTOPLASTY  NOV 2006  . SHOULDER ARTHROSCOPY W/ SUBACROMIAL DECOMPRESSION AND DISTAL CLAVICLE EXCISION  10-09-2008   AND DEBRIDEMENT OF RIGHT SHOULDER IMPINGEMENT & [facility] JOINT ARTHRITIS  . SPINE SURGERY    . TOTAL THYROIDECTOMY  11-03-2001   PAPILLARY THYROID CARCINOMA  . TRANSTHORACIC ECHOCARDIOGRAM  12/ 2012   grade I diastolic dysfunction/ ef 27-06%  . ULNAR NERVE TRANSPOSITION Right 04/28/2014   Procedure: RIGHT ELBOW ULNA NERVE RELEASE TRANSPOSTION AND MEDIAL EPICONDYLAR DEBRIDEMENT AND REPAIR;  Surgeon: Linna Hoff, MD;  Location: [place];  Service: Orthopedics;  Laterality: Right;    Family Psychiatric History: Reviewed.  Family History:  Family History  Problem Relation Age of Onset  . Depression Sister   . Rectal cancer Sister   . Lung cancer Brother   . Kidney disease Mother   . Depression Mother   . Stroke Father   . COPD Father   . Hypertension Father   . Prostate cancer Father   . Depression Daughter   . Drug abuse Daughter   . Psychiatric Illness Son     Social History:  Social History   Social History  . Marital status: Married    Spouse name: N/A  . Number of children: N/A  . Years of education: N/A   Social History Main Topics  . Smoking status: Never Smoker  . Smokeless tobacco: Never Used  . Alcohol use 0.0 oz/week     Comment: 1-2 glasses per month  . Drug use: No  . Sexual activity: Not Asked   Other Topics Concern  . None   Social History Narrative   Originally from [place]. Moved to [place] in 1984. 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 No international travel other than [place]. Has a dog at  home, poodle mix. His daughter who lives with him now has a dog. No mold exposure. No bird exposure. No hot tub exposure. Enjoys gardening.     Allergies:  Allergies  Allergen Reactions  . Percocet [Oxycodone-Acetaminophen] Itching    Can take generic.  States only has a problem with percocet brand    Metabolic Disorder Labs: Lab Results  Component Value Date   HGBA1C 6.1 08/04/2012   MPG 123 (H) 01/05/2012   No results found for: PROLACTIN Lab Results  Component Value Date   CHOL 151 07/23/2016   TRIG 175 (H) 07/23/2016   HDL 74 07/23/2016   CHOLHDL 2.0  07/23/2016   VLDL 35 (H) 07/23/2016   LDLCALC 42 07/23/2016   LDLCALC 86 08/01/2015     Current Medications: Current Outpatient Prescriptions  Medication Sig Dispense Refill  . aspirin EC 81 MG tablet Take 1 tablet by mouth daily.    [name] atorvastatin (LIPITOR) 40 MG tablet TAKE 1 TABLET (40 MG TOTAL) BY MOUTH DAILY. 90 tablet 3  . AXIRON 30 MG/ACT SOLN Place 2 Act onto the skin daily.    . benzonatate (TESSALON) 100 MG capsule Take 1-2 capsules (100-200 mg total) by mouth 3 (three) times daily as needed for cough. 90 capsule 1  . budesonide-formoterol (SYMBICORT) 80-4.5 MCG/ACT inhaler Inhale 2 puffs into the lungs 2 (two) times daily. 1 Inhaler 0  . Coenzyme Q10 (CO Q 10 PO) Take by mouth daily.    . fluconazole (DIFLUCAN) 150 MG tablet Take 1 tablet (150 mg total) by mouth once. Reported on 08/04/2015 4 tablet 2  . Immune Globulin, Human, (HIZENTRA) 4 GM/20ML SOLN Inject into the skin once a week. wednesday    . LamoTRIgine 300 MG TB24 Take 1 tablet (300 mg total) by mouth daily. 90 tablet 0  . levalbuterol (XOPENEX HFA) 45 MCG/ACT inhaler Inhale 2 puffs into the lungs every 6 (six) hours as needed for wheezing. 1 Inhaler 5  . levothyroxine (SYNTHROID) 150 MCG tablet Take 1 tablet (150 mcg total) by mouth daily. 90 tablet 3  . lidocaine-prilocaine (EMLA) cream as needed.    [name] LORazepam (ATIVAN) 0.5 MG tablet Take 1 tab  daily as needed and 1 tab at bed time 60 tablet 2  . LUTEIN PO Take by mouth daily.    . OMEGA-3 KRILL OIL PO Take by mouth daily.    [name] Respiratory Therapy Supplies (FLUTTER) DEVI Use as directed 1 each 0  . Saw Palmetto, Serenoa repens, (SAW PALMETTO PO) Take by mouth.    . Spacer/Aero-Holding Chambers (AEROCHAMBER MV) inhaler Use as instructed 1 each 0  . sucralfate (CARAFATE) 1 G tablet Take 1 g by mouth as needed.   3  . telmisartan (MICARDIS) 80 MG tablet Take 1 tablet (80 mg total) by mouth daily. 90 tablet 3  . timolol (TIMOPTIC-XR) 0.25 % ophthalmic gel-forming INSTILL 1 DROP EVERY DAY INTO BOTH EYES  5  . zolpidem (AMBIEN) 10 MG tablet Take 1 tablet (10 mg total) by mouth at bedtime as needed. for sleep 30 tablet 1   No current facility-administered medications for this visit.     Neurologic: Headache: No Seizure: No Paresthesias: No  Musculoskeletal: Strength & Muscle Tone: within normal limits Gait & Station: normal Patient leans: N/A  Psychiatric Specialty Exam: ROS  Blood pressure 120/74, pulse 89, height 6' (1.829 m), weight 178 lb 6.4 oz (80.9 kg).Body mass index is 24.2 kg/m.  General Appearance: Casual  Eye Contact:  Good  Speech:  Clear and Coherent  Volume:  Normal  Mood:  Euthymic  Affect:  Congruent  Thought Process:  Goal Directed  Orientation:  Full (Time, Place, and Person)  Thought Content: WDL and Logical   Suicidal Thoughts:  No  Homicidal Thoughts:  No  Memory:  Immediate;   Good Recent;   Good Remote;   Good  Judgement:  Good  Insight:  Good  Psychomotor Activity:  Normal  Concentration:  Concentration: Fair and Attention Span: Fair  Recall:  Good  Fund of Knowledge: Good  Language: Good  Akathisia:  No  Handed:  Right  AIMS (if indicated):  0  Assets:  Communication Skills Desire for Improvement Housing Physical Health  ADL's:  Intact  Cognition: WNL  Sleep:  good    Assessment: Bipolar disorder type I.  Plan: Patient is a  stable on his current psychiatric medication.  He has no tremors, shakes, EPS or any other concerns.  Continue Lamictal 300 mg daily, Ambien 5-10 mg at bedtime for insomnia and lorazepam 0.5 mg as needed.  Discussed medication side effects and benefits.  Encouraged to continue therapy at office with counselor.  Recommended to call us back if he has any question, concern or if he feel worsening of the symptom.  Follow-up in 3 months.  Taronda Comacho T., MD 12/11/2016, 12:15 PM

## 2016-12-11 NOTE — Progress Notes (Signed)
Therapist Progress Note   Time: 3:10-4:00 pm  Participation Level: Active  Behavioral Response: Casual, Alert, Anxious  Type of Therapy: Individual Therapy  Treatment Goals Addressed: Coping  Interventions: Supportive/ CBT  Summary: Pt presented for individual therapy. Pt discussed his psychiatric symptoms and current life events. Pt has appt with his psychiatrist tomorrow. Asked open-ended questions about his stressors with his family and current coping skills. Discussed healthy coping skills with pt. Processed with pt his problem-solving skills and conflict resolution skills. Pt verbalizes his understanding of the skills but is obviously uncomfortable using them with his family. Helped develop a self care plan with pt as he feels he has no personal support. Pt was able to formulate healthy alternative thoughts using basic CBT concept.  Suicidal/Homicidal: No  Therapist Response: Assessed pt's current functioning and reviewed progress. Assisted pt processing issues with family, feelings of hopelessness, boundaries, acceptance,  and processing for management of stressors.  Plan: Return in 2 weeks using CBT  Diagnosis: Axis 1: Bipolar 1 disorder,   [name], LCAS 12/09/16

## 2016-12-14 ENCOUNTER — Other Ambulatory Visit: Payer: Self-pay | Admitting: Internal Medicine

## 2016-12-30 ENCOUNTER — Ambulatory Visit (HOSPITAL_COMMUNITY): Payer: Self-pay | Admitting: Licensed Clinical Social Worker

## 2017-01-05 ENCOUNTER — Other Ambulatory Visit (HOSPITAL_COMMUNITY): Payer: Self-pay | Admitting: Psychiatry

## 2017-01-05 DIAGNOSIS — F319 Bipolar disorder, unspecified: Secondary | ICD-10-CM

## 2017-01-13 ENCOUNTER — Ambulatory Visit (INDEPENDENT_AMBULATORY_CARE_PROVIDER_SITE_OTHER): Payer: 59 | Admitting: Licensed Clinical Social Worker

## 2017-01-13 DIAGNOSIS — F319 Bipolar disorder, unspecified: Secondary | ICD-10-CM

## 2017-01-15 ENCOUNTER — Encounter (HOSPITAL_COMMUNITY): Payer: Self-pay | Admitting: Licensed Clinical Social Worker

## 2017-01-15 NOTE — Progress Notes (Signed)
Therapist Progress Note   Time: 2:10-3:00 pm  Participation Level: Active  Behavioral Response: Casual, Alert, Anxious  Type of Therapy: Individual Therapy  Treatment Goals Addressed: Coping  Interventions: Supportive/ CBT  Summary: Pt presented for individual therapy. Pt discussed his psychiatric symptoms and current life events. Pt's [facility] and has [facility] home. Because of this, pt's anxiety has increased. Pt is "waiting for the other shoe to drop." Pt walks on eggshells in his own house. Pt's son self-injures with razor blades with some horrible effects. Pt has hope for his son but honestly knows that he will have to care for his son for the rest of his life. Processed with pt acceptance. Pt's other 2 children who are in their 57s also live at home. It is a dysfunctional situation but pt does not know what to do next. This situation has been ongoing for years. Taught pt several alternatives to use for his anxious moments which seem to occur mostly when he is home. Pt wants to continue therapy because he has no one else to talk to, no other supports. "I just want to vent."    Suicidal/Homicidal: No  Therapist Response: Assessed pt's current functioning and reviewed progress. Assisted pt processing issues with family, feelings of hopelessness, boundaries, acceptance,  and processing for management of stressors.  Plan: Return in 2 weeks using CBT  Diagnosis: Axis 1: Bipolar 1 disorder,   Alex [name], LCAS 01/13/17

## 2017-01-21 ENCOUNTER — Encounter: Payer: Self-pay | Admitting: Internal Medicine

## 2017-01-21 ENCOUNTER — Other Ambulatory Visit: Payer: Managed Care, Other (non HMO)

## 2017-01-21 ENCOUNTER — Ambulatory Visit (INDEPENDENT_AMBULATORY_CARE_PROVIDER_SITE_OTHER): Payer: Managed Care, Other (non HMO) | Admitting: Internal Medicine

## 2017-01-21 DIAGNOSIS — I2510 Atherosclerotic heart disease of native coronary artery without angina pectoris: Secondary | ICD-10-CM

## 2017-01-21 DIAGNOSIS — F411 Generalized anxiety disorder: Secondary | ICD-10-CM | POA: Diagnosis not present

## 2017-01-21 DIAGNOSIS — R600 Localized edema: Secondary | ICD-10-CM

## 2017-01-21 DIAGNOSIS — G8929 Other chronic pain: Secondary | ICD-10-CM

## 2017-01-21 DIAGNOSIS — R5382 Chronic fatigue, unspecified: Secondary | ICD-10-CM

## 2017-01-21 DIAGNOSIS — I10 Essential (primary) hypertension: Secondary | ICD-10-CM | POA: Diagnosis not present

## 2017-01-21 DIAGNOSIS — R5383 Other fatigue: Secondary | ICD-10-CM | POA: Insufficient documentation

## 2017-01-21 DIAGNOSIS — M25572 Pain in left ankle and joints of left foot: Secondary | ICD-10-CM | POA: Diagnosis not present

## 2017-01-21 NOTE — Assessment & Plan Note (Signed)
ASA, amlodipine

## 2017-01-21 NOTE — Assessment & Plan Note (Signed)
BP Readings from Last 3 Encounters:  01/21/17 (!) 148/96  11/11/16 (!) 162/94  10/23/16 (!) 140/98

## 2017-01-21 NOTE — Assessment & Plan Note (Signed)
L>R Labs

## 2017-01-21 NOTE — Assessment & Plan Note (Signed)
CFS - worse Labs 

## 2017-01-21 NOTE — Progress Notes (Signed)
Subjective:  Patient ID: Alex [name], male    DOB: October 30, 1955  Age: 61 y.o. MRN: [id number]  CC: No chief complaint on file.   HPI Alex [name] presents for severe fatigue x 2 weeks - worse F/u hypogonadism, dyslipidemia C/o L ankle/leg swelling - had an MRI; no DVT  Outpatient Medications Prior to Visit  Medication Sig Dispense Refill  . aspirin EC 81 MG tablet Take 1 tablet by mouth daily.    [name] atorvastatin (LIPITOR) 40 MG tablet TAKE 1 TABLET (40 MG TOTAL) BY MOUTH DAILY. 90 tablet 1  . AXIRON 30 MG/ACT SOLN Place 2 Act onto the skin daily.    . benzonatate (TESSALON) 100 MG capsule Take 1-2 capsules (100-200 mg total) by mouth 3 (three) times daily as needed for cough. 90 capsule 1  . budesonide-formoterol (SYMBICORT) 80-4.5 MCG/ACT inhaler Inhale 2 puffs into the lungs 2 (two) times daily. 1 Inhaler 0  . Coenzyme Q10 (CO Q 10 PO) Take by mouth daily.    . fluconazole (DIFLUCAN) 150 MG tablet Take 1 tablet (150 mg total) by mouth once. Reported on 08/04/2015 4 tablet 2  . Immune Globulin, Human, (HIZENTRA) 4 GM/20ML SOLN Inject into the skin once a week. wednesday    . LamoTRIgine 300 MG TB24 Take 1 tablet (300 mg total) by mouth daily. 90 tablet 0  . levalbuterol (XOPENEX HFA) 45 MCG/ACT inhaler Inhale 2 puffs into the lungs every 6 (six) hours as needed for wheezing. 1 Inhaler 5  . levothyroxine (SYNTHROID) 150 MCG tablet Take 1 tablet (150 mcg total) by mouth daily. 90 tablet 3  . lidocaine-prilocaine (EMLA) cream as needed.    [name] LORazepam (ATIVAN) 0.5 MG tablet Take 1 tab daily as needed and 1 tab at bed time 60 tablet 2  . LUTEIN PO Take by mouth daily.    . OMEGA-3 KRILL OIL PO Take by mouth daily.    [name] Respiratory Therapy Supplies (FLUTTER) DEVI Use as directed 1 each 0  . Spacer/Aero-Holding Chambers (AEROCHAMBER MV) inhaler Use as instructed 1 each 0  . sucralfate (CARAFATE) 1 G tablet Take 1 g by mouth as needed.   3  . telmisartan (MICARDIS) 80 MG tablet Take  1 tablet (80 mg total) by mouth daily. 90 tablet 3  . timolol (TIMOPTIC-XR) 0.25 % ophthalmic gel-forming INSTILL 1 DROP EVERY DAY INTO BOTH EYES  5  . zolpidem (AMBIEN) 10 MG tablet Take 1 tablet (10 mg total) by mouth at bedtime as needed. for sleep 30 tablet 1  . Saw Palmetto, Serenoa repens, (SAW PALMETTO PO) Take by mouth.     No facility-administered medications prior to visit.     ROS Review of Systems  Constitutional: Positive for fatigue. Negative for appetite change, chills, fever and unexpected weight change.  HENT: Positive for congestion. Negative for nosebleeds, sneezing, sore throat and trouble swallowing.   Eyes: Negative for itching and visual disturbance.  Respiratory: Negative for cough.   Cardiovascular: Positive for leg swelling. Negative for palpitations.  Gastrointestinal: Negative for abdominal distention, blood in stool, diarrhea and nausea.  Genitourinary: Positive for urgency. Negative for frequency and hematuria.  Musculoskeletal: Positive for arthralgias and gait problem. Negative for back pain, joint swelling and neck pain.  Skin: Negative for rash.  Neurological: Negative for dizziness, tremors, speech difficulty and weakness.  Psychiatric/Behavioral: Negative for agitation, dysphoric mood and sleep disturbance. The patient is not nervous/anxious.     Objective:  BP (!) 148/96 (BP Location: Left  Arm, Patient Position: Sitting, Cuff Size: Normal)   Pulse 72   Temp 98.3 F (36.8 C) (Oral)   Ht 6' (1.829 m)   Wt 180 lb (81.6 kg)   SpO2 99%   BMI 24.41 kg/m   BP Readings from Last 3 Encounters:  01/21/17 (!) 148/96  11/11/16 (!) 162/94  10/23/16 (!) 140/98    Wt Readings from Last 3 Encounters:  01/21/17 180 lb (81.6 kg)  11/11/16 179 lb (81.2 kg)  10/23/16 179 lb 12.8 oz (81.6 kg)    Physical Exam  Constitutional: He is oriented to person, place, and time. He appears well-developed. No distress.  NAD  HENT:  Mouth/Throat: Oropharynx is  clear and moist.  Eyes: Conjunctivae are normal. Pupils are equal, round, and reactive to light.  Neck: Normal range of motion. No JVD present. No thyromegaly present.  Cardiovascular: Normal rate, regular rhythm, normal heart sounds and intact distal pulses.  Exam reveals no gallop and no friction rub.   No murmur heard. Pulmonary/Chest: Effort normal and breath sounds normal. No respiratory distress. He has no wheezes. He has no rales. He exhibits no tenderness.  Abdominal: Soft. Bowel sounds are normal. He exhibits no distension and no mass. There is no tenderness. There is no rebound and no guarding.  Musculoskeletal: Normal range of motion. He exhibits edema and tenderness.  Lymphadenopathy:    He has no cervical adenopathy.  Neurological: He is alert and oriented to person, place, and time. He has normal reflexes. No cranial nerve deficit. He exhibits normal muscle tone. He displays a negative Romberg sign. Coordination and gait normal.  Skin: Skin is warm and dry. No rash noted.  Psychiatric: His behavior is normal. Judgment and thought content normal.  L ankle is slightly swolled  Lab Results  Component Value Date   WBC 11.9 (H) 07/23/2016   HGB 16.3 07/23/2016   HCT 47.9 07/23/2016   PLT 210 07/23/2016   GLUCOSE 82 07/23/2016   CHOL 151 07/23/2016   TRIG 175 (H) 07/23/2016   HDL 74 07/23/2016   LDLDIRECT 59.0 02/02/2016   LDLCALC 42 07/23/2016   ALT 40 07/23/2016   AST 24 07/23/2016   NA 139 07/23/2016   K 3.7 07/23/2016   CL 101 07/23/2016   CREATININE 1.03 07/23/2016   BUN 20 07/23/2016   CO2 27 07/23/2016   TSH 3.50 07/23/2016   PSA 1.5 07/23/2016   HGBA1C 6.1 08/04/2012    Dg Ribs Unilateral Left  Result Date: 11/11/2016 CLINICAL DATA:  Fall.  Left flank pain .  Left rib pain . EXAM: LEFT RIBS - 2 VIEW COMPARISON:  CT 03/19/2016.  Chest x-ray 11/10/2015 . FINDINGS: Surgical clips are noted over the neck. Subtle nondisplaced fracture of the left anterolateral  sixth rib cannot be excluded. Old left second, third, and tenth rib fractures. Prominent degenerative changes and scoliosis thoracic spine. Prior lumbar spine fusion. IMPRESSION: 1. Left anterolateral sixth rib nondisplaced fracture cannot be excluded . Old left second, third, and tenth rib fractures. 2. Thoracic spine scoliosis and degenerative change. Prior lumbar spine fusion Electronically Signed   By: Marcello Moores  Register   On: 11/11/2016 15:38    Assessment & Plan:   There are no diagnoses linked to this encounter. I have discontinued Alex [name] (Saw Palmetto, Serenoa repens, (SAW PALMETTO PO)). I am also having him maintain his Immune Globulin (Human), Coenzyme Q10 (CO Q 10 PO), OMEGA-3 KRILL OIL PO, sucralfate, AXIRON, lidocaine-prilocaine, aspirin EC, fluconazole, telmisartan, benzonatate, LUTEIN PO,  FLUTTER, AEROCHAMBER MV, budesonide-formoterol, levalbuterol, levothyroxine, timolol, LORazepam, LamoTRIgine, zolpidem, and atorvastatin.  No orders of the defined types were placed in this encounter.    Follow-up: No Follow-up on file.  Walker Kehr, MD

## 2017-01-21 NOTE — Patient Instructions (Signed)
[place]

## 2017-01-21 NOTE — Assessment & Plan Note (Signed)
On Lorazepam prn 

## 2017-01-21 NOTE — Assessment & Plan Note (Signed)
Old fx on MRI Labs

## 2017-01-22 ENCOUNTER — Ambulatory Visit (INDEPENDENT_AMBULATORY_CARE_PROVIDER_SITE_OTHER): Payer: Self-pay | Admitting: Orthopedic Surgery

## 2017-01-22 ENCOUNTER — Other Ambulatory Visit (INDEPENDENT_AMBULATORY_CARE_PROVIDER_SITE_OTHER): Payer: Managed Care, Other (non HMO)

## 2017-01-22 DIAGNOSIS — I10 Essential (primary) hypertension: Secondary | ICD-10-CM | POA: Diagnosis not present

## 2017-01-22 DIAGNOSIS — R5382 Chronic fatigue, unspecified: Secondary | ICD-10-CM | POA: Diagnosis not present

## 2017-01-22 DIAGNOSIS — G8929 Other chronic pain: Secondary | ICD-10-CM | POA: Diagnosis not present

## 2017-01-22 DIAGNOSIS — R600 Localized edema: Secondary | ICD-10-CM

## 2017-01-22 DIAGNOSIS — I2510 Atherosclerotic heart disease of native coronary artery without angina pectoris: Secondary | ICD-10-CM | POA: Diagnosis not present

## 2017-01-22 DIAGNOSIS — M25572 Pain in left ankle and joints of left foot: Secondary | ICD-10-CM | POA: Diagnosis not present

## 2017-01-22 LAB — CBC WITH DIFFERENTIAL/PLATELET
Basophils Absolute: 0.1 10*3/uL (ref 0.0–0.1)
Basophils Relative: 1.4 % (ref 0.0–3.0)
Eosinophils Absolute: 0.5 10*3/uL (ref 0.0–0.7)
Eosinophils Relative: 6.2 % — ABNORMAL HIGH (ref 0.0–5.0)
HCT: 49 % (ref 39.0–52.0)
Hemoglobin: 16 g/dL (ref 13.0–17.0)
Lymphocytes Relative: 12.1 % (ref 12.0–46.0)
Lymphs Abs: 1 10*3/uL (ref 0.7–4.0)
MCHC: 32.6 g/dL (ref 30.0–36.0)
MCV: 84.6 fl (ref 78.0–100.0)
Monocytes Absolute: 0.9 10*3/uL (ref 0.1–1.0)
Monocytes Relative: 10.9 % (ref 3.0–12.0)
Neutro Abs: 5.5 10*3/uL (ref 1.4–7.7)
Neutrophils Relative %: 69.4 % (ref 43.0–77.0)
Platelets: 243 10*3/uL (ref 150.0–400.0)
RBC: 5.8 Mil/uL (ref 4.22–5.81)
RDW: 15.6 % — ABNORMAL HIGH (ref 11.5–15.5)
WBC: 8 10*3/uL (ref 4.0–10.5)

## 2017-01-22 LAB — BASIC METABOLIC PANEL
BUN: 15 mg/dL (ref 6–23)
CO2: 30 mEq/L (ref 19–32)
Calcium: 8.8 mg/dL (ref 8.4–10.5)
Chloride: 104 mEq/L (ref 96–112)
Creatinine, Ser: 1.01 mg/dL (ref 0.40–1.50)
GFR: 79.26 mL/min (ref >60.00)
Glucose, Bld: 101 mg/dL — ABNORMAL HIGH (ref 70–99)
Potassium: 4.4 mEq/L (ref 3.5–5.1)
Sodium: 140 mEq/L (ref 135–145)

## 2017-01-22 LAB — TSH: TSH: 3.1 u[IU]/mL (ref 0.35–4.50)

## 2017-01-22 LAB — URINALYSIS, ROUTINE W REFLEX MICROSCOPIC
Bilirubin Urine: NEGATIVE
Ketones, ur: NEGATIVE
Leukocytes, UA: NEGATIVE
Nitrite: NEGATIVE
Specific Gravity, Urine: 1.025 (ref 1.000–1.030)
Total Protein, Urine: NEGATIVE
Urine Glucose: NEGATIVE
Urobilinogen, UA: 0.2 (ref 0.0–1.0)
pH: 6 (ref 5.0–8.0)

## 2017-01-22 LAB — CORTISOL: Cortisol, Plasma: 8.4 ug/dL

## 2017-01-22 LAB — VITAMIN B12: Vitamin B-12: 1331 pg/mL — ABNORMAL HIGH (ref 211–911)

## 2017-01-22 LAB — HEPATIC FUNCTION PANEL
ALT: 37 U/L (ref 0–53)
AST: 32 U/L (ref 0–37)
Albumin: 4.2 g/dL (ref 3.5–5.2)
Alkaline Phosphatase: 69 U/L (ref 39–117)
Bilirubin, Direct: 0.2 mg/dL (ref 0.0–0.3)
Total Bilirubin: 0.7 mg/dL (ref 0.2–1.2)
Total Protein: 6.1 g/dL (ref 6.0–8.3)

## 2017-01-22 LAB — SEDIMENTATION RATE: Sed Rate: 3 mm/hr (ref 0–20)

## 2017-01-22 LAB — MAGNESIUM: Magnesium: 1.9 mg/dL (ref 1.5–2.5)

## 2017-01-22 LAB — T4, FREE: Free T4: 0.72 ng/dL (ref 0.60–1.60)

## 2017-01-22 LAB — URIC ACID: Uric Acid, Serum: 4.3 mg/dL (ref 4.0–7.8)

## 2017-01-22 LAB — TESTOSTERONE: Testosterone: 207.92 ng/dL — ABNORMAL LOW (ref 300.00–890.00)

## 2017-01-23 LAB — D-DIMER, QUANTITATIVE: D-Dimer, Quant: 0.43 mcg/mL FEU (ref <0.50)

## 2017-01-27 ENCOUNTER — Ambulatory Visit (HOSPITAL_COMMUNITY): Payer: Self-pay | Admitting: Licensed Clinical Social Worker

## 2017-02-03 ENCOUNTER — Other Ambulatory Visit: Payer: Self-pay | Admitting: Orthopedic Surgery

## 2017-02-03 ENCOUNTER — Ambulatory Visit (HOSPITAL_COMMUNITY): Payer: Self-pay | Admitting: Licensed Clinical Social Worker

## 2017-02-03 DIAGNOSIS — G5621 Lesion of ulnar nerve, right upper limb: Secondary | ICD-10-CM

## 2017-02-04 ENCOUNTER — Ambulatory Visit
Admission: RE | Admit: 2017-02-04 | Discharge: 2017-02-04 | Disposition: A | Discharge status: Home / Self Care | Payer: Managed Care, Other (non HMO) | Source: Ambulatory Visit | Attending: Orthopedic Surgery | Admitting: Orthopedic Surgery

## 2017-02-04 DIAGNOSIS — G5621 Lesion of ulnar nerve, right upper limb: Secondary | ICD-10-CM

## 2017-02-07 ENCOUNTER — Other Ambulatory Visit: Payer: Self-pay | Admitting: Cardiology

## 2017-02-10 NOTE — Progress Notes (Signed)
HPI: FU CAD. Cath in 2007 showed a 40 LAD; EF 50-55. Had normal ETT 7/11 with 10:00 on [place]. Echocardiogram December 2012 showed normal LV function and grade 1 diastolic dysfunction. Holter monitor in January 2014 showed sinus rhythm, PACs, PVCs and brief PAT. Pt seen by Dr Cyndia Bent 1/18 for TAA (4.1cm) and fu arranged one year. Since he was last seen, the patient denies any dyspnea on exertion, orthopnea, PND, pedal edema, palpitations, syncope or chest pain.   Current Outpatient Prescriptions  Medication Sig Dispense Refill  . aspirin EC 81 MG tablet Take 1 tablet by mouth daily.    [name] atorvastatin (LIPITOR) 40 MG tablet TAKE 1 TABLET (40 MG TOTAL) BY MOUTH DAILY. 90 tablet 1  . AXIRON 30 MG/ACT SOLN Place 2 Act onto the skin daily.    . benzonatate (TESSALON) 100 MG capsule Take 1-2 capsules (100-200 mg total) by mouth 3 (three) times daily as needed for cough. 90 capsule 1  . budesonide-formoterol (SYMBICORT) 80-4.5 MCG/ACT inhaler Inhale 2 puffs into the lungs 2 (two) times daily. 1 Inhaler 0  . Coenzyme Q10 (CO Q 10 PO) Take by mouth daily.    . fluconazole (DIFLUCAN) 150 MG tablet TAKE 1 TABLET BY MOUTH AS A SINGLE DOSE 4 tablet 2  . Immune Globulin, Human, (HIZENTRA) 4 GM/20ML SOLN Inject into the skin once a week. wednesday    . LamoTRIgine 300 MG TB24 Take 1 tablet (300 mg total) by mouth daily. 90 tablet 0  . levalbuterol (XOPENEX HFA) 45 MCG/ACT inhaler Inhale 2 puffs into the lungs every 6 (six) hours as needed for wheezing. 1 Inhaler 5  . levothyroxine (SYNTHROID) 150 MCG tablet Take 1 tablet (150 mcg total) by mouth daily. 90 tablet 3  . lidocaine-prilocaine (EMLA) cream as needed.    [name] LORazepam (ATIVAN) 0.5 MG tablet Take 1 tab daily as needed and 1 tab at bed time 60 tablet 2  . LUTEIN PO Take by mouth daily.    . OMEGA-3 KRILL OIL PO Take by mouth daily.    [name] Respiratory Therapy Supplies (FLUTTER) DEVI Use as directed 1 each 0  . Spacer/Aero-Holding Chambers  (AEROCHAMBER MV) inhaler Use as instructed 1 each 0  . sucralfate (CARAFATE) 1 G tablet Take 1 g by mouth as needed.   3  . telmisartan (MICARDIS) 80 MG tablet TAKE 1 TABLET (80 MG TOTAL) BY MOUTH DAILY. 90 tablet 3  . timolol (TIMOPTIC-XR) 0.25 % ophthalmic gel-forming INSTILL 1 DROP EVERY DAY INTO BOTH EYES  5  . zolpidem (AMBIEN) 10 MG tablet Take 1 tablet (10 mg total) by mouth at bedtime as needed. for sleep 30 tablet 1   No current facility-administered medications for this visit.      Past Medical History:  Diagnosis Date  . Anxiety   . At risk for sleep apnea    STOP-BANG= 5    SENT TO PCP 04-22-2014  . Bipolar I disorder ([place])   . Bleeding ulcer 06/2014  . BPH (benign prostatic hypertrophy) with urinary obstruction   . Chronic back pain   . Complication of anesthesia POST URINARY RETENTION---  2006 SHOULDER SURGERY MARKED BRADYCARDIA VAGAL RESPONSE NO ISSUE W/ SURGERY AFTER THIS ONE  . Coronary atherosclerosis CARDIOLOGIST- DR Joselynn Amoroso--  LAST VISIT 01-05-2012 IN EPIC   NON-OBSTRUCTIVE MILD DISEASE  . 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 Depression   . Epicondylitis    right elbow  . GERD (gastroesophageal reflux disease)   .  Glaucoma BOTH EYES -- NO DROPS   . History of chronic prostatitis   . 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 History of thyroid cancer PRIMARY (NO METS FROM ORBITAL CANCER)--   IN REMISSION   S/P TOTAL THYROIDECTOMY  , CHEMORADIATION  (ONCOLOGIST -- DR Griffith Citron)  . Hyperlipidemia   . Hypertension   . Immune deficiency disorder ([place])    COMMON VARIABLE  . Nocturia   . OA (osteoarthritis)   . Positional vertigo   . Renal calculi LEFT KIDNEY-- NON-OBSTRUCTIVE  . Ulnar nerve compression    right elbow  . Urinary hesitancy     Past Surgical History:  Procedure  Laterality Date  . CARDIAC CATHETERIZATION  01-16-2006  DR Marcello Moores WALL   MILD CORONARY ATHEROSCLEROSIS/ MID TO DISTAL LAD 40% STENOSIS/ LVF 50-55%  . KNEE ARTHROSCOPY  05/01/2012   Procedure: ARTHROSCOPY KNEE;  Surgeon: Johnn Hai, MD;  Location: [facility];  Service: Orthopedics;  Laterality: Left;  debridement and removal of loose body  . LEFT ANKLE ARTHROSCOPY W/ DEBRIDEMENT  05-12-2007  . LEFT HYDROCELECTOMY  03-29-2005   AND REPAIR LEFT INGUINAL HERNIA W/ MESH  . MOHS SURGERY  2002   RIGHT ORBITAL CANCER  . NASAL ENDOSCOPY  08-07-2005   RIGHT EPISTAXIS  / POST SEPTOPLASTY  (HX RIGHT ORBITAL CA & S/P RADIATION/ NECROSIS ANTERIOR END OF BOTH INFERIOR TURBINATES)  . occuloplastic surgery  2002  . PARS PLANA VITRECTOMY  11-06-2004   RIGHT EYE RADIATION RETINOPATHY W/ HEMORRHAGE  . REPAIR UNDESENDED RIGHT TESTICLE / RIGHT INGUINAL HERNIA  AGE 14  . RIGHT ANKLE ARTHROSCOPY W/ EXTENSIVE DEBRIDEMENT  04-05-2008   . RIGHT SUPRAOMOHYOID NECK DISSECTION   03-08-2003   ZONES 1,2,3;   SUBMANDIBULAR MASS / METASTATIC SQUAMOUS CELL CARCINOMA RIGHT NECK  . SEPTOPLASTY  NOV 2006  . SHOULDER ARTHROSCOPY W/ SUBACROMIAL DECOMPRESSION AND DISTAL CLAVICLE EXCISION  10-09-2008   AND DEBRIDEMENT OF RIGHT SHOULDER IMPINGEMENT & [facility] JOINT ARTHRITIS  . SPINE SURGERY    . TOTAL THYROIDECTOMY  11-03-2001   PAPILLARY THYROID CARCINOMA  . TRANSTHORACIC ECHOCARDIOGRAM  12/ 2012   grade I diastolic dysfunction/ ef 46-27%  . ULNAR NERVE TRANSPOSITION Right 04/28/2014   Procedure: RIGHT ELBOW ULNA NERVE RELEASE TRANSPOSTION AND MEDIAL EPICONDYLAR DEBRIDEMENT AND REPAIR;  Surgeon: Linna Hoff, MD;  Location: [place];  Service: Orthopedics;  Laterality: Right;    Social History   Social History  . Marital status: Married    Spouse name: N/A  . Number of children: N/A  . Years of education: N/A   Occupational History  . Not on file.   Social History Main Topics  .  Smoking status: Never Smoker  . Smokeless tobacco: Never Used  . Alcohol use 0.0 oz/week     Comment: 1-2 glasses per month  . Drug use: No  . Sexual activity: Not on file   Other Topics Concern  . Not on file   Social History Narrative   Originally from [place]. Moved to [place] in 1984. 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 No international travel other than [place]. Has  a dog at home, poodle mix. His daughter who lives with him now has a dog. No mold exposure. No bird exposure. No hot tub exposure. Enjoys gardening.     Family History  Problem Relation Age of Onset  . Depression Sister   . Rectal cancer Sister   . Lung cancer Brother   . Kidney disease Mother   . Depression Mother   . Stroke Father   . COPD Father   . Hypertension Father   . Prostate cancer Father   . Depression Daughter   . Drug abuse Daughter   . Psychiatric Illness Son     ROS: no fevers or chills, productive cough, hemoptysis, dysphasia, odynophagia, melena, hematochezia, dysuria, hematuria, rash, seizure activity, orthopnea, PND, pedal edema, claudication. Remaining systems are negative.  Physical Exam: Well-developed well-nourished in no acute distress.  Skin is warm and dry.  HEENT is normal.  Neck is supple. No bruits Chest is clear to auscultation with normal expansion.  Cardiovascular exam is regular rate and rhythm.  Abdominal exam nontender or distended. No masses palpated. Extremities show no edema. neuro grossly intact  ECG- Sinus rhythm at a rate of 68. Normal axis. No ST changes. personally reviewed  A/P  1 Coronary artery disease-continue aspirin and statin.  2 hyperlipidemia-continue statin. Lipids and liver monitored by primary care.  3 hypertension-blood pressure is controlled. Continue present medications.  4 dilated aortic root-patient will  require repeat CTA August 2018.   [name], MD

## 2017-02-11 ENCOUNTER — Other Ambulatory Visit: Payer: Self-pay | Admitting: Internal Medicine

## 2017-02-12 ENCOUNTER — Encounter: Payer: Self-pay | Admitting: Internal Medicine

## 2017-02-13 NOTE — Telephone Encounter (Signed)
Routing to dr plotnikov, are you ok with refilling without office visit, patient has sent you a mychart message, please advise, thanks

## 2017-02-14 ENCOUNTER — Ambulatory Visit
Admission: RE | Admit: 2017-02-14 | Discharge: 2017-02-14 | Disposition: A | Discharge status: Home / Self Care | Payer: Self-pay | Source: Ambulatory Visit | Attending: Pulmonary Disease | Admitting: Pulmonary Disease

## 2017-02-14 ENCOUNTER — Other Ambulatory Visit: Payer: Self-pay | Admitting: Pulmonary Disease

## 2017-02-14 DIAGNOSIS — J42 Unspecified chronic bronchitis: Secondary | ICD-10-CM

## 2017-02-17 ENCOUNTER — Ambulatory Visit (INDEPENDENT_AMBULATORY_CARE_PROVIDER_SITE_OTHER): Payer: 59 | Admitting: Licensed Clinical Social Worker

## 2017-02-17 ENCOUNTER — Encounter (HOSPITAL_COMMUNITY): Payer: Self-pay | Admitting: Licensed Clinical Social Worker

## 2017-02-17 DIAGNOSIS — F319 Bipolar disorder, unspecified: Secondary | ICD-10-CM | POA: Diagnosis not present
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
## 2017-02-18 ENCOUNTER — Other Ambulatory Visit: Payer: Self-pay | Admitting: Internal Medicine

## 2017-02-18 MED ORDER — FLUCONAZOLE 150 MG PO TABS: ORAL_TABLET | ORAL | 2 refills | Status: DC

## 2017-02-21 ENCOUNTER — Other Ambulatory Visit: Payer: Self-pay | Admitting: *Deleted

## 2017-02-21 DIAGNOSIS — J479 Bronchiectasis, uncomplicated: Secondary | ICD-10-CM

## 2017-02-21 DIAGNOSIS — I712 Thoracic aortic aneurysm, without rupture, unspecified: Secondary | ICD-10-CM

## 2017-02-24 ENCOUNTER — Encounter: Payer: Self-pay | Admitting: Cardiology

## 2017-02-24 ENCOUNTER — Ambulatory Visit (INDEPENDENT_AMBULATORY_CARE_PROVIDER_SITE_OTHER): Payer: Managed Care, Other (non HMO) | Admitting: Cardiology

## 2017-02-24 VITALS — BP 128/94 | HR 68 | Ht 72.0 in | Wt 177.0 lb

## 2017-02-24 DIAGNOSIS — E7800 Pure hypercholesterolemia, unspecified: Secondary | ICD-10-CM | POA: Diagnosis not present

## 2017-02-24 DIAGNOSIS — I10 Essential (primary) hypertension: Secondary | ICD-10-CM | POA: Diagnosis not present

## 2017-02-24 DIAGNOSIS — I712 Thoracic aortic aneurysm, without rupture, unspecified: Secondary | ICD-10-CM

## 2017-02-24 DIAGNOSIS — I2510 Atherosclerotic heart disease of native coronary artery without angina pectoris: Secondary | ICD-10-CM

## 2017-02-24 MED ORDER — HYDROCHLOROTHIAZIDE 12.5 MG PO CAPS: 12.5000 mg | ORAL_CAPSULE | Freq: Every day | ORAL | 3 refills | Status: DC

## 2017-02-24 NOTE — Patient Instructions (Signed)
Medication Instructions:   START HCTZ 12.5 MG ONCE DAILY  Labwork:  Your physician recommends that you return for lab work in: [place]  Testing/Procedures:  CTA OF THE CHEST W/WO CONTRAST TO F/U THORACIC ANEURYSM  Follow-Up:  Your physician wants you to follow-up in: [place] will receive a reminder letter in the mail two months in advance. If you don't receive a letter, please call our office to schedule the follow-up appointment.   If you need a refill on your cardiac medications before your next appointment, please call your pharmacy.

## 2017-02-25 ENCOUNTER — Other Ambulatory Visit: Payer: Self-pay | Admitting: Surgery

## 2017-03-03 ENCOUNTER — Ambulatory Visit (HOSPITAL_COMMUNITY): Payer: Self-pay | Admitting: Licensed Clinical Social Worker

## 2017-03-05 ENCOUNTER — Other Ambulatory Visit (HOSPITAL_COMMUNITY): Payer: Self-pay | Admitting: Psychiatry

## 2017-03-05 DIAGNOSIS — F3162 Bipolar disorder, current episode mixed, moderate: Secondary | ICD-10-CM

## 2017-03-07 LAB — BASIC METABOLIC PANEL
BUN/Creatinine Ratio: 14 (ref 10–24)
BUN: 15 mg/dL (ref 8–27)
CO2: 25 mmol/L (ref 20–29)
Calcium: 9.1 mg/dL (ref 8.6–10.2)
Chloride: 101 mmol/L (ref 96–106)
Creatinine, Ser: 1.09 mg/dL (ref 0.76–1.27)
GFR calc Af Amer: 83 mL/min/{1.73_m2} (ref >59)
GFR calc non Af Amer: 72 mL/min/{1.73_m2} (ref >59)
Glucose: 72 mg/dL (ref 65–99)
Potassium: 4.4 mmol/L (ref 3.5–5.2)
Sodium: 142 mmol/L (ref 134–144)

## 2017-03-10 ENCOUNTER — Other Ambulatory Visit (HOSPITAL_COMMUNITY): Payer: Self-pay

## 2017-03-10 DIAGNOSIS — F3162 Bipolar disorder, current episode mixed, moderate: Secondary | ICD-10-CM

## 2017-03-10 MED ORDER — ZOLPIDEM TARTRATE 10 MG PO TABS: 10.0000 mg | ORAL_TABLET | Freq: Every evening | ORAL | 0 refills | Status: DC | PRN

## 2017-03-14 ENCOUNTER — Other Ambulatory Visit (HOSPITAL_COMMUNITY): Payer: Self-pay | Admitting: Psychiatry

## 2017-03-14 DIAGNOSIS — F319 Bipolar disorder, unspecified: Secondary | ICD-10-CM

## 2017-03-17 ENCOUNTER — Ambulatory Visit (HOSPITAL_COMMUNITY): Payer: Self-pay | Admitting: Psychiatry

## 2017-03-18 ENCOUNTER — Ambulatory Visit (HOSPITAL_COMMUNITY): Payer: Managed Care, Other (non HMO) | Admitting: Licensed Clinical Social Worker

## 2017-03-19 ENCOUNTER — Other Ambulatory Visit: Payer: Self-pay

## 2017-03-19 ENCOUNTER — Encounter: Payer: Self-pay | Admitting: Surgery

## 2017-03-19 ENCOUNTER — Other Ambulatory Visit (HOSPITAL_COMMUNITY): Payer: Self-pay

## 2017-03-19 DIAGNOSIS — F319 Bipolar disorder, unspecified: Secondary | ICD-10-CM

## 2017-03-19 MED ORDER — LAMOTRIGINE ER 300 MG PO TB24: 1.0000 | ORAL_TABLET | Freq: Every day | ORAL | 0 refills | Status: DC

## 2017-03-26 ENCOUNTER — Ambulatory Visit (INDEPENDENT_AMBULATORY_CARE_PROVIDER_SITE_OTHER): Payer: Managed Care, Other (non HMO) | Admitting: Psychiatry

## 2017-03-26 ENCOUNTER — Encounter (HOSPITAL_COMMUNITY): Payer: Self-pay | Admitting: Psychiatry

## 2017-03-26 DIAGNOSIS — F419 Anxiety disorder, unspecified: Secondary | ICD-10-CM

## 2017-03-26 DIAGNOSIS — F3162 Bipolar disorder, current episode mixed, moderate: Secondary | ICD-10-CM | POA: Diagnosis not present

## 2017-03-26 DIAGNOSIS — Z818 Family history of other mental and behavioral disorders: Secondary | ICD-10-CM | POA: Diagnosis not present

## 2017-03-26 DIAGNOSIS — Z79899 Other long term (current) drug therapy: Secondary | ICD-10-CM

## 2017-03-26 DIAGNOSIS — F319 Bipolar disorder, unspecified: Secondary | ICD-10-CM

## 2017-03-26 DIAGNOSIS — Z813 Family history of other psychoactive substance abuse and dependence: Secondary | ICD-10-CM | POA: Diagnosis not present

## 2017-03-26 MED ORDER — ZOLPIDEM TARTRATE 10 MG PO TABS: 10.0000 mg | ORAL_TABLET | Freq: Every evening | ORAL | 0 refills | Status: DC | PRN

## 2017-03-26 MED ORDER — LAMOTRIGINE ER 300 MG PO TB24: 1.0000 | ORAL_TABLET | Freq: Every day | ORAL | 0 refills | Status: DC

## 2017-03-26 MED ORDER — LORAZEPAM 0.5 MG PO TABS: ORAL_TABLET | ORAL | 1 refills | Status: DC

## 2017-03-26 NOTE — Progress Notes (Signed)
BH MD/PA/NP OP Progress Note  03/26/2017 12:57 PM Alex [name]  MRN:  [id number]  Chief Complaint:  Subjective:  I'm doing okay.  I'm taking medication.  Ambien helping the sleep.  HPI: Patient came for his follow-up appointment.  He's taking his medication as prescribed.  He denies any major concern but continues to have chronic family issues.  His younger daughter going to [employer] and he is happy about her.  His son has a relapse when he required procedure after he swallowed pins.  Patient told he is doing much better and he is keeping his job for more than a year but he has lot of absences.  He denies any irritability, anger, mania or any psychosis.  Recently he seen his primary care physician has complete blood work including uric acid, serum testosterone.  He has a leg swelling but he is pleased that gout his been rule out and swelling has been reduced.  He also seen his cardiologist for his aneurysm which has been stable.  He is going to have an MRI for his aorta few days.  He has no rash or itching.  He wants to continue Lamictal lorazepam and Ambien.  Patient denies drinking alcohol or using any illegal substances.  Visit Diagnosis:    ICD-10-CM   1. Bipolar 1 disorder, mixed, moderate (HCC) F31.62 zolpidem (AMBIEN) 10 MG tablet  2. Bipolar I disorder (HCC) F31.9 LamoTRIgine 300 MG TB24  3. Bipolar 1 disorder (HCC) F31.9 LORazepam (ATIVAN) 0.5 MG tablet    Past Psychiatric History: Reviewed. Patient has been seeing in this office since 2009. He has history of depression mood swing and anger. He's been admitted to behavioral [facility] due to suicidal thoughts but he denies any suicidal attempt. He denies any psychosis but admitted poor impulse control. In the past he has taken Seroquel and Cymbalta.   Past Medical History:  Past Medical History:  Diagnosis Date  . Anxiety   . At risk for sleep apnea    STOP-BANG= 5    SENT TO PCP 04-22-2014  . Bipolar I disorder ([place])    . Bleeding ulcer 06/2014  . BPH (benign prostatic hypertrophy) with urinary obstruction   . Chronic back pain   . Complication of anesthesia POST URINARY RETENTION---  2006 SHOULDER SURGERY MARKED BRADYCARDIA VAGAL RESPONSE NO ISSUE W/ SURGERY AFTER THIS ONE  . Coronary atherosclerosis CARDIOLOGIST- DR CRENSHAW--  LAST VISIT 01-05-2012 IN EPIC   NON-OBSTRUCTIVE MILD DISEASE  . 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 Depression   . Epicondylitis    right elbow  . GERD (gastroesophageal reflux disease)   . Glaucoma BOTH EYES -- NO DROPS   . History of chronic prostatitis   . 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 History of thyroid cancer PRIMARY (NO METS FROM ORBITAL CANCER)--   IN REMISSION   S/P TOTAL THYROIDECTOMY  , CHEMORADIATION  (ONCOLOGIST -- DR Griffith Citron)  . Hyperlipidemia   . Hypertension   . Immune deficiency disorder ([place])    COMMON VARIABLE  . Nocturia   . OA (osteoarthritis)   . Positional vertigo   . Renal calculi LEFT KIDNEY-- NON-OBSTRUCTIVE  . Ulnar nerve compression    right elbow  . Urinary hesitancy  Past Surgical History:  Procedure Laterality Date  . CARDIAC CATHETERIZATION  01-16-2006  DR Marcello Moores WALL   MILD CORONARY ATHEROSCLEROSIS/ MID TO DISTAL LAD 40% STENOSIS/ LVF 50-55%  . KNEE ARTHROSCOPY  05/01/2012   Procedure: ARTHROSCOPY KNEE;  Surgeon: Johnn Hai, MD;  Location: [facility];  Service: Orthopedics;  Laterality: Left;  debridement and removal of loose body  . LEFT ANKLE ARTHROSCOPY W/ DEBRIDEMENT  05-12-2007  . LEFT HYDROCELECTOMY  03-29-2005   AND REPAIR LEFT INGUINAL HERNIA W/ MESH  . MOHS SURGERY  2002   RIGHT ORBITAL CANCER  . NASAL ENDOSCOPY  08-07-2005   RIGHT EPISTAXIS  / POST SEPTOPLASTY  (HX RIGHT ORBITAL CA & S/P RADIATION/ NECROSIS  ANTERIOR END OF BOTH INFERIOR TURBINATES)  . occuloplastic surgery  2002  . PARS PLANA VITRECTOMY  11-06-2004   RIGHT EYE RADIATION RETINOPATHY W/ HEMORRHAGE  . REPAIR UNDESENDED RIGHT TESTICLE / RIGHT INGUINAL HERNIA  AGE 83  . RIGHT ANKLE ARTHROSCOPY W/ EXTENSIVE DEBRIDEMENT  04-05-2008   . RIGHT SUPRAOMOHYOID NECK DISSECTION   03-08-2003   ZONES 1,2,3;   SUBMANDIBULAR MASS / METASTATIC SQUAMOUS CELL CARCINOMA RIGHT NECK  . SEPTOPLASTY  NOV 2006  . SHOULDER ARTHROSCOPY W/ SUBACROMIAL DECOMPRESSION AND DISTAL CLAVICLE EXCISION  10-09-2008   AND DEBRIDEMENT OF RIGHT SHOULDER IMPINGEMENT & [facility] JOINT ARTHRITIS  . SPINE SURGERY    . TOTAL THYROIDECTOMY  11-03-2001   PAPILLARY THYROID CARCINOMA  . TRANSTHORACIC ECHOCARDIOGRAM  12/ 2012   grade I diastolic dysfunction/ ef 10-17%  . ULNAR NERVE TRANSPOSITION Right 04/28/2014   Procedure: RIGHT ELBOW ULNA NERVE RELEASE TRANSPOSTION AND MEDIAL EPICONDYLAR DEBRIDEMENT AND REPAIR;  Surgeon: Linna Hoff, MD;  Location: [facility];  Service: Orthopedics;  Laterality: Right;    Family Psychiatric History: Reviewed  Family History:  Family History  Problem Relation Age of Onset  . Depression Sister   . Rectal cancer Sister   . Lung cancer Brother   . Kidney disease Mother   . Depression Mother   . Stroke Father   . COPD Father   . Hypertension Father   . Prostate cancer Father   . Depression Daughter   . Drug abuse Daughter   . Psychiatric Illness Son     Social History:  Social History   Social History  . Marital status: Married    Spouse name: N/A  . Number of children: N/A  . Years of education: N/A   Social History Main Topics  . Smoking status: Never Smoker  . Smokeless tobacco: Never Used  . Alcohol use 0.0 oz/week     Comment: 1-2 glasses per month  . Drug use: No  . Sexual activity: Not on file   Other Topics Concern  . Not on file   Social History Narrative   Originally from [place]. Moved to [place] in  1984. 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 No international travel other than [place]. Has a dog at home, poodle mix. His daughter who lives with him now has a dog. No mold exposure. No bird exposure. No hot tub exposure. Enjoys gardening.     Allergies:  Allergies  Allergen Reactions  . Percocet [Oxycodone-Acetaminophen] Itching    Can take generic.  States only has a problem with percocet brand    Metabolic Disorder Labs: Lab Results  Component  Value Date   HGBA1C 6.1 08/04/2012   MPG 123 (H) 01/05/2012   No results found for: PROLACTIN Lab Results  Component Value Date   CHOL 151 07/23/2016   TRIG 175 (H) 07/23/2016   HDL 74 07/23/2016   CHOLHDL 2.0 07/23/2016   VLDL 35 (H) 07/23/2016   LDLCALC 42 07/23/2016   LDLCALC 86 08/01/2015     Current Medications: Current Outpatient Prescriptions  Medication Sig Dispense Refill  . aspirin EC 81 MG tablet Take 1 tablet by mouth daily.    [name] atorvastatin (LIPITOR) 40 MG tablet TAKE 1 TABLET (40 MG TOTAL) BY MOUTH DAILY. 90 tablet 1  . AXIRON 30 MG/ACT SOLN Place 2 Act onto the skin daily.    . benzonatate (TESSALON) 100 MG capsule Take 1-2 capsules (100-200 mg total) by mouth 3 (three) times daily as needed for cough. 90 capsule 1  . budesonide-formoterol (SYMBICORT) 80-4.5 MCG/ACT inhaler Inhale 2 puffs into the lungs 2 (two) times daily. 1 Inhaler 0  . Coenzyme Q10 (CO Q 10 PO) Take by mouth daily.    . fluconazole (DIFLUCAN) 150 MG tablet TAKE 1 TABLET BY MOUTH AS A SINGLE DOSE 4 tablet 2  . hydrochlorothiazide (MICROZIDE) 12.5 MG capsule Take 1 capsule (12.5 mg total) by mouth daily. 90 capsule 3  . Immune Globulin, Human, (HIZENTRA) 4 GM/20ML SOLN Inject into the skin once a week. wednesday    . LamoTRIgine 300 MG TB24 Take 1 tablet (300 mg total) by mouth daily. 30 tablet 0  .  levalbuterol (XOPENEX HFA) 45 MCG/ACT inhaler Inhale 2 puffs into the lungs every 6 (six) hours as needed for wheezing. 1 Inhaler 5  . levothyroxine (SYNTHROID) 150 MCG tablet Take 1 tablet (150 mcg total) by mouth daily. 90 tablet 3  . lidocaine-prilocaine (EMLA) cream as needed.    [name] LORazepam (ATIVAN) 0.5 MG tablet Take 1 tab daily as needed and 1 tab at bed time 60 tablet 2  . LUTEIN PO Take by mouth daily.    . OMEGA-3 KRILL OIL PO Take by mouth daily.    [name] Respiratory Therapy Supplies (FLUTTER) DEVI Use as directed 1 each 0  . Spacer/Aero-Holding Chambers (AEROCHAMBER MV) inhaler Use as instructed 1 each 0  . sucralfate (CARAFATE) 1 G tablet Take 1 g by mouth as needed.   3  . telmisartan (MICARDIS) 80 MG tablet TAKE 1 TABLET (80 MG TOTAL) BY MOUTH DAILY. 90 tablet 3  . timolol (TIMOPTIC-XR) 0.25 % ophthalmic gel-forming INSTILL 1 DROP EVERY DAY INTO BOTH EYES  5  . zolpidem (AMBIEN) 10 MG tablet Take 1 tablet (10 mg total) by mouth at bedtime as needed. for sleep 30 tablet 0   No current facility-administered medications for this visit.     Neurologic: Headache: No Seizure: No Paresthesias: No  Musculoskeletal: Strength & Muscle Tone: within normal limits Gait & Station: normal Patient leans: N/A  Psychiatric Specialty Exam: Review of Systems  Constitutional: Negative.   Cardiovascular: Positive for leg swelling.  Genitourinary: Negative.   Musculoskeletal: Positive for back pain.  Skin: Negative for itching and rash.  Neurological: Negative.     There were no vitals taken for this visit.There is no height or weight on file to calculate BMI.  General Appearance: Casual  Eye Contact:  Good  Speech:  Clear and Coherent  Volume:  Normal  Mood:  Anxious  Affect:  Congruent  Thought Process:  Goal Directed  Orientation:  Full (Time, Place, and Person)  Thought Content: Logical and Rumination   Suicidal Thoughts:  No  Homicidal Thoughts:  No  Memory:  Immediate;    Good Recent;   Good Remote;   Good  Judgement:  Good  Insight:  Good  Psychomotor Activity:  Normal  Concentration:  Concentration: Good and Attention Span: Good  Recall:  Good  Fund of Knowledge: Good  Language: Good  Akathisia:  No  Handed:  Right  AIMS (if indicated):  0  Assets:  Communication Skills Desire for Improvement Housing Resilience Social Support  ADL's:  Intact  Cognition: WNL  Sleep:  ok   Assessment: Bipolar disorder type I.  Anxiety disorder NOS.  Plan: Patient is a stable on his current psychiatric medication despite chronic family issues.  He is happy that his son is keeping the job and younger daughter is going to [employer] for college.  He wants to continue his current psychiatric medication and reported no side effects including tremors, shakes, EPS or any rash.  I will continue Lamictal 300 mg daily, Ambien 5-10 mg at bedtime and lorazepam 0.5 mg twice a day if needed.  Discussed medication side effects and benefits.  Recommended to call us back if he has any question or any concern.  He has not seen Anderson Malta in a while and he relies if needed he will call to schedule appointment.  Follow-up in 3 months.  Kleigh Hoelzer T., MD 03/26/2017, 12:57 PM

## 2017-03-31 ENCOUNTER — Ambulatory Visit (HOSPITAL_COMMUNITY): Payer: Managed Care, Other (non HMO) | Admitting: Licensed Clinical Social Worker

## 2017-03-31 ENCOUNTER — Inpatient Hospital Stay: Admission: RE | Admit: 2017-03-31 | Payer: Self-pay | Source: Ambulatory Visit

## 2017-03-31 ENCOUNTER — Telehealth: Payer: Self-pay | Admitting: Cardiology

## 2017-03-31 DIAGNOSIS — I712 Thoracic aortic aneurysm, without rupture, unspecified: Secondary | ICD-10-CM

## 2017-03-31 NOTE — Telephone Encounter (Signed)
New message     Pt wants to go to [facility] they need a new order in for the CT

## 2017-03-31 NOTE — Telephone Encounter (Signed)
Alex [name] aware order has been changed.

## 2017-04-04 ENCOUNTER — Ambulatory Visit: Payer: Self-pay | Admitting: Cardiology

## 2017-04-04 ENCOUNTER — Ambulatory Visit (HOSPITAL_COMMUNITY): Payer: Managed Care, Other (non HMO) | Admitting: Licensed Clinical Social Worker

## 2017-04-21 ENCOUNTER — Other Ambulatory Visit: Payer: Self-pay | Admitting: Oncology

## 2017-04-21 ENCOUNTER — Telehealth: Payer: Self-pay | Admitting: Oncology

## 2017-04-21 NOTE — Telephone Encounter (Signed)
Spoke with patient regarding his upcoming appt in Nov.

## 2017-04-22 ENCOUNTER — Other Ambulatory Visit: Payer: Self-pay | Admitting: Dermatology

## 2017-04-24 ENCOUNTER — Ambulatory Visit (INDEPENDENT_AMBULATORY_CARE_PROVIDER_SITE_OTHER): Payer: 59 | Admitting: Licensed Clinical Social Worker

## 2017-04-24 DIAGNOSIS — F3162 Bipolar disorder, current episode mixed, moderate: Secondary | ICD-10-CM

## 2017-04-30 ENCOUNTER — Encounter (HOSPITAL_COMMUNITY): Payer: Self-pay | Admitting: Licensed Clinical Social Worker

## 2017-04-30 NOTE — Progress Notes (Signed)
Therapist Progress Note   Time: 2:10-3:00 pm  Participation Level: Active  Behavioral Response: Casual, Alert, Anxious  Type of Therapy: Individual Therapy  Treatment Goals Addressed: Coping  Interventions: Supportive/ CBT  Summary: Pt presented for individual therapy. Pt discussed his psychiatric symptoms and current life events. Pt shared his son is back in [facility] for inserting projectile into himself. This behavior is on-going and his son is in and out of [facility]. Pt was frustrated as to why his son continues to self harm.   Pt continues to feel helpless and hopeless with his son, his wife is angry and frustrated. Again processed acceptance with pt. Pt was open to discussion. Pt has minimal support for dealing with these feelings about his son. Asked open ended questions about next steps for his son. Pt continues to grieve his son. Validated pt's feelings. Pt continues to use grounding and mindfulness activities. Gave pt breathing instruction handout.    Suicidal/Homicidal: No  Therapist Response: Assessed pt's current functioning and reviewed progress. Assisted pt processing issues with son, feelings of hopelessness, supports, acceptance,  and processing for management of stressors.  Plan: Return in 2 weeks using CBT  Diagnosis: Axis 1: Bipolar 1 disorder, recent episode mixed, moderate  [name], LCAS 03/24/17

## 2017-05-19 DIAGNOSIS — H401121 Primary open-angle glaucoma, left eye, mild stage: Secondary | ICD-10-CM | POA: Insufficient documentation

## 2017-05-19 DIAGNOSIS — H4051X3 Glaucoma secondary to other eye disorders, right eye, severe stage: Secondary | ICD-10-CM | POA: Insufficient documentation

## 2017-05-19 DIAGNOSIS — H04123 Dry eye syndrome of bilateral lacrimal glands: Secondary | ICD-10-CM | POA: Insufficient documentation

## 2017-05-19 DIAGNOSIS — T66XXXS Radiation sickness, unspecified, sequela: Secondary | ICD-10-CM | POA: Insufficient documentation

## 2017-05-19 DIAGNOSIS — H25812 Combined forms of age-related cataract, left eye: Secondary | ICD-10-CM | POA: Insufficient documentation

## 2017-05-26 ENCOUNTER — Encounter (HOSPITAL_COMMUNITY): Payer: Self-pay | Admitting: Licensed Clinical Social Worker

## 2017-05-26 ENCOUNTER — Ambulatory Visit (INDEPENDENT_AMBULATORY_CARE_PROVIDER_SITE_OTHER): Payer: 59 | Admitting: Licensed Clinical Social Worker

## 2017-05-26 DIAGNOSIS — F3162 Bipolar disorder, current episode mixed, moderate: Secondary | ICD-10-CM

## 2017-05-26 NOTE — Progress Notes (Signed)
Therapist Progress Note   Time: 4:10-5:00 pm  Participation Level: Active  Behavioral Response: Casual, Alert, Anxious  Type of Therapy: Individual Therapy  Treatment Goals Addressed: Coping  Interventions: Supportive/ CBT  Summary: Pt presented for individual therapy. Pt discussed his psychiatric symptoms and current life events.  Pt talked about his current life stressors. His financial situation, his son with a mental illness, his daughter who is currently living out of the home with a family friend. These are his same stressors. Problem solve with pt at each session but pt follows through minimally, His son was recently released from [facility]. Gave pt some information for TMS, ECT, Ketamine therapy, and genetic testing for his son. Pt still feels hopeless and helpless about his son. Discussed financial responsibility that pt has because of his son and legal ramifications. Taught pt problem solving skills. Pt and his wife have the same disagreements about $ but there is no resolution to the issues. Taught pt how to be proactive instead of reactive. Pt was receptive to suggestions.      Suicidal/Homicidal: No  Therapist Response: Assessed pt's current functioning and reviewed progress. Assisted pt processing issues with son, feelings of hopelessness, supports, treatment for severe depression, problem solving skills  and processing for management of stressors.  Plan: Return in 2 weeks using CBT  Diagnosis: Axis 1: Bipolar 1 disorder, recent episode mixed, moderate  [name], LCAS 05/26/17

## 2017-06-04 ENCOUNTER — Other Ambulatory Visit: Payer: Self-pay | Admitting: *Deleted

## 2017-06-04 ENCOUNTER — Telehealth: Payer: Self-pay | Admitting: Cardiology

## 2017-06-04 DIAGNOSIS — I10 Essential (primary) hypertension: Secondary | ICD-10-CM

## 2017-06-09 ENCOUNTER — Ambulatory Visit (INDEPENDENT_AMBULATORY_CARE_PROVIDER_SITE_OTHER): Payer: 59 | Admitting: Licensed Clinical Social Worker

## 2017-06-09 DIAGNOSIS — F3162 Bipolar disorder, current episode mixed, moderate: Secondary | ICD-10-CM | POA: Diagnosis not present

## 2017-06-09 NOTE — Progress Notes (Signed)
Therapist Progress Note   Time: 4:10-5:00 pm  Participation Level: Active  Behavioral Response: Casual, Alert, Anxious  Type of Therapy: Individual Therapy  Treatment Goals Addressed: Coping  Interventions: Supportive/ CBT  Summary: Pt presented for individual therapy. Pt discussed his psychiatric symptoms and current life events.  Pt talked about his current life stressors. Pt's son is back in [facility]. Pt is distraught about his son. His son is a chronic self mutilator, in [facility] about every month.  His financial situation, his son with a mental illness, his daughter who is currently living out of the home with a family friend. These are his same stressors. Problem solve with pt at each session but pt follows through minimally,  Pt still feels hopeless and helpless about his son. Discussed options of letting his son go vs planning his future with the responsibility of his son. Pt does not lie either choice.  Re discussed his ability to be proactive instead of reactive. Pt was receptive to suggestions.      Suicidal/Homicidal: No  Therapist Response: Assessed pt's current functioning and reviewed progress. Assisted pt processing issues with son, feelings of hopelessness, supports,  and processing for management of stressors.  Plan: Return in 2 weeks using CBT  Diagnosis: Axis 1: Bipolar 1 disorder, recent episode mixed, moderate  [name], LCAS 06/09/17

## 2017-06-10 LAB — BASIC METABOLIC PANEL
BUN/Creatinine Ratio: 18 (ref 10–24)
BUN: 17 mg/dL (ref 8–27)
CO2: 21 mmol/L (ref 20–29)
Calcium: 8.5 mg/dL — ABNORMAL LOW (ref 8.6–10.2)
Chloride: 99 mmol/L (ref 96–106)
Creatinine, Ser: 0.97 mg/dL (ref 0.76–1.27)
GFR calc Af Amer: 96 mL/min/{1.73_m2} (ref >59)
GFR calc non Af Amer: 83 mL/min/{1.73_m2} (ref >59)
Glucose: 96 mg/dL (ref 65–99)
Potassium: 4.3 mmol/L (ref 3.5–5.2)
Sodium: 138 mmol/L (ref 134–144)

## 2017-06-12 ENCOUNTER — Encounter (HOSPITAL_COMMUNITY): Payer: Self-pay | Admitting: Licensed Clinical Social Worker

## 2017-06-13 ENCOUNTER — Ambulatory Visit (INDEPENDENT_AMBULATORY_CARE_PROVIDER_SITE_OTHER)
Admission: RE | Admit: 2017-06-13 | Discharge: 2017-06-13 | Disposition: A | Discharge status: Home / Self Care | Payer: 59 | Source: Ambulatory Visit | Attending: Cardiology | Admitting: Cardiology

## 2017-06-13 DIAGNOSIS — I712 Thoracic aortic aneurysm, without rupture, unspecified: Secondary | ICD-10-CM

## 2017-06-13 MED ORDER — IOPAMIDOL (ISOVUE-370) INJECTION 76%
100.0000 mL | Freq: Once | INTRAVENOUS | Status: AC | PRN
  Administered 2017-06-13: 80 mL via INTRAVENOUS

## 2017-06-14 DIAGNOSIS — I712 Thoracic aortic aneurysm, without rupture, unspecified: Secondary | ICD-10-CM

## 2017-06-14 HISTORY — DX: Thoracic aortic aneurysm, without rupture: I71.2

## 2017-06-14 HISTORY — DX: Thoracic aortic aneurysm, without rupture, unspecified: I71.20

## 2017-06-16 ENCOUNTER — Telehealth: Payer: Self-pay | Admitting: Cardiology

## 2017-06-16 DIAGNOSIS — K769 Liver disease, unspecified: Secondary | ICD-10-CM

## 2017-06-16 NOTE — Telephone Encounter (Signed)
New message    Pt is calling asking for a call back from nurse. He said it's about an MRI and his recent CT. Please call.

## 2017-06-16 NOTE — Telephone Encounter (Signed)
Leave message for pt to call back 

## 2017-06-16 NOTE — Telephone Encounter (Signed)
Pt returned call, informed pt about his CT scan result, and that Dr Stanford Breed need him to have an MRI, pt stated he saw the result in his MyChart and will like for the MRI to be schedule as soon as possible sometime this week, because he have an appt with Dr Oletta Lamas his GI Dr and he will like for him to get the result, and also he have a stong history of cancer in his family two of his sister have died from cancer and another in battling cancer now, advised pt I will send this to Dr Stanford Breed nurse and she will give you a call back. MRI ordered placed and send to scheduler to be schedule.

## 2017-06-17 ENCOUNTER — Telehealth: Payer: Self-pay | Admitting: Cardiology

## 2017-06-17 ENCOUNTER — Other Ambulatory Visit: Payer: Self-pay | Admitting: Internal Medicine

## 2017-06-17 ENCOUNTER — Encounter: Payer: Self-pay | Admitting: Cardiology

## 2017-06-17 ENCOUNTER — Other Ambulatory Visit: Payer: Self-pay | Admitting: Cardiology

## 2017-06-17 DIAGNOSIS — K769 Liver disease, unspecified: Secondary | ICD-10-CM

## 2017-06-17 NOTE — Telephone Encounter (Signed)
Called [facility] to inquire as to scheduling of MRI liver.  Their office called the patient and scheduled this and also went over any restrictions with the patient.

## 2017-06-17 NOTE — Telephone Encounter (Signed)
Please call,says he needs to talk to you again.

## 2017-06-17 NOTE — Telephone Encounter (Signed)
Spoke with pt, he has no other questions at this time.

## 2017-06-22 ENCOUNTER — Ambulatory Visit
Admission: RE | Admit: 2017-06-22 | Discharge: 2017-06-22 | Disposition: A | Discharge status: Home / Self Care | Payer: 59 | Source: Ambulatory Visit | Attending: Cardiology | Admitting: Cardiology

## 2017-06-22 DIAGNOSIS — K769 Liver disease, unspecified: Secondary | ICD-10-CM

## 2017-06-22 MED ORDER — GADOBENATE DIMEGLUMINE 529 MG/ML IV SOLN
17.0000 mL | Freq: Once | INTRAVENOUS | Status: AC | PRN
  Administered 2017-06-22: 17 mL via INTRAVENOUS

## 2017-06-23 ENCOUNTER — Ambulatory Visit (HOSPITAL_COMMUNITY): Payer: Self-pay | Admitting: Licensed Clinical Social Worker

## 2017-06-23 ENCOUNTER — Telehealth: Payer: Self-pay | Admitting: *Deleted

## 2017-06-23 ENCOUNTER — Encounter (HOSPITAL_COMMUNITY): Payer: Self-pay

## 2017-06-23 ENCOUNTER — Ambulatory Visit (HOSPITAL_COMMUNITY): Payer: 59 | Admitting: Licensed Clinical Social Worker

## 2017-06-23 NOTE — Telephone Encounter (Addendum)
-----   Message from Lelon Perla, MD sent at 06/23/2017  8:36 AM EST ----- Would refer to GI; make sure they plan fu ultrasounds as directed as well. [name]   Left message for pt to call

## 2017-06-25 NOTE — Telephone Encounter (Signed)
Spoke with pt, he is aware of the results and has a follow up with GI this week. Results available to GI through Epic.

## 2017-06-25 NOTE — Progress Notes (Signed)
Alex [name]  [phone] Fax:(336) [phone]     ID: Alex [name] DOB: [birth year]  MR#: [id number]  UMP#:[id number]  Patient Care Team: Cassandria Anger, MD as PCP - General Magrinat, Virgie Dad, MD (Hematology and Oncology) Franchot Gallo, MD as Attending Physician (Urology) Jerrell Belfast, MD (Otolaryngology) Stanford Breed Denice Bors, MD (Cardiology) Laurence Spates, MD as Consulting Physician (Gastroenterology) Pablo Lawrence (Allergy and Immunology) Javier Glazier, MD as Consulting Physician (Pulmonary Disease) OTHER MD:  CHIEF COMPLAINT: Periorbital squamous cell carcinoma; thyroid cancer; iron deficiency anemia  CURRENT TREATMENT: Observation   INTERVAL HISTORY: Alex [name] returns today for 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  He has also had some squamous cell skin cancers and a basal cell removed since his last visit   He also has had a CT Angio Chest completed on 06/13/2017 for a follow up from an ascending thoracic aortic aneurysm with results showing: IMPRESSION: No significant change in mild aneurysmal dilatation of the ascending thoracic aorta. 1.6 cm rounded enhancing area in the posterior segment of the right lobe of the liver. This has vessels leading into and out of it, suggesting a vascular malformation. A hyperenhancing mass is also a possibility. Minimal atheromatous aortic calcification. No acute abnormality. Aortic Atherosclerosis (ICD10-I70.0).   An MR Liver completed on 06/23/2017 followed with results showing: IMPRESSION: The lesion of concern in segment 6 of the liver has single large portal vein and hepatic vein branches extending to tt, in a pattern of enhancement which mirrors these vascular structures. 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 These can be seen in normal patients and also on patient's with portal venous  hypertension and in this case the lesion is a small aneurysmal collection measuring 1.6 by 1.3 cm. Levoconvex lumbar scoliosis. Several incidental hepatic cysts.  He went to his GI specialist, Dr. Oletta Lamas for a follow up appointment regarding his recent MRI and CT angio and was advised that there was no malignancy noted.   He also had dermatologic pathology completed on 04/22/2017 [phone]) with results showing: Skin , crown scalp, shave, curettage with squamous cell carcinoma in situ. Prior to, Alex [name] had dermatologic pathology completed on 06/04/2016 [phone]) with results showing: Skin, crown of scalp, shave with squamous cell carcinoma in situ. Skin, back of neck, shave with basal cell carcinoma, superficial and nodular patterns. Lastly, Alex [name] had dermatologic pathology completed on 09/05/2015 [phone]) with results showing: Skin, inferior scalp, shave with hypertrophic actinic keratosis, base involved. Skin, anterior left scalp, shave with hypertrophic actinic keratosis. Skin , left scalp, shave with actinic keratosis. Skin, center scalp, shave with actinic keratosis. Skin, posterior scalp, shave with early actinic keratosis. Skin, right scalp, shave with actinic keratosis.   His prior eye malignancy issues are followed at [facility] by Dr. Arlina Robes and he had exam there 05/19/2017 showing no evidence of recurrent malignancy.  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 On his right eye he was sent to a Retina Specialist at [facility], who informed that his right lens is cloudy and they recommended a procedure to fix that.   He has his thyroid checked by his PCP, Dr. Alain Marion. He denies smoking cigarettes, consuming alcohol, or illegal drug use.    REVIEW OF SYSTEMS: Alex [name] reports that he works around his house and walks some, but doesn't exercise much. He reports that his wife and son are doing  well overall. She denies unusual headaches, visual changes,  nausea, vomiting, or dizziness. There has been no unusual cough, phlegm production, or pleurisy. This been no change in bowel or bladder habits. She denies unexplained fatigue or unexplained weight loss, bleeding, rash, or fever. A detailed review of systems was otherwise stable.    PAST MEDICAL HISTORY: Past Medical History:  Diagnosis Date  . Anxiety   . At risk for sleep apnea    STOP-BANG= 5    SENT TO PCP 04-22-2014  . Bipolar I disorder ([place])   . Bleeding ulcer 06/2014  . BPH (benign prostatic hypertrophy) with urinary obstruction   . Chronic back pain   . Complication of anesthesia POST URINARY RETENTION---  2006 SHOULDER SURGERY MARKED BRADYCARDIA VAGAL RESPONSE NO ISSUE W/ SURGERY AFTER THIS ONE  . Coronary atherosclerosis CARDIOLOGIST- DR CRENSHAW--  LAST VISIT 01-05-2012 IN EPIC   NON-OBSTRUCTIVE MILD DISEASE  . 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 Depression   . Epicondylitis    right elbow  . GERD (gastroesophageal reflux disease)   . Glaucoma BOTH EYES -- NO DROPS   . History of chronic prostatitis   . 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 History of thyroid cancer PRIMARY (NO METS FROM ORBITAL CANCER)--   IN REMISSION   S/P TOTAL THYROIDECTOMY  , CHEMORADIATION  (ONCOLOGIST -- DR Griffith Citron)  . Hyperlipidemia   . Hypertension   . Immune deficiency disorder ([place])    COMMON VARIABLE  . Nocturia   . OA (osteoarthritis)   . Positional vertigo   . Renal calculi LEFT KIDNEY-- NON-OBSTRUCTIVE  . Ulnar nerve compression    right elbow  . Urinary hesitancy     PAST SURGICAL HISTORY: Past Surgical History:  Procedure Laterality Date  . ARTHROSCOPY KNEE Left 05/01/2012   Performed by Johnn Hai, MD at [facility]  . CARDIAC CATHETERIZATION  01-16-2006  DR Marcello Moores WALL    MILD CORONARY ATHEROSCLEROSIS/ MID TO DISTAL LAD 40% STENOSIS/ LVF 50-55%  . LEFT ANKLE ARTHROSCOPY W/ DEBRIDEMENT  05-12-2007  . LEFT HYDROCELECTOMY  03-29-2005   AND REPAIR LEFT INGUINAL HERNIA W/ MESH  . MOHS SURGERY  2002   RIGHT ORBITAL CANCER  . NASAL ENDOSCOPY  08-07-2005   RIGHT EPISTAXIS  / POST SEPTOPLASTY  (HX RIGHT ORBITAL CA & S/P RADIATION/ NECROSIS ANTERIOR END OF BOTH INFERIOR TURBINATES)  . occuloplastic surgery  2002  . PARS PLANA VITRECTOMY  11-06-2004   RIGHT EYE RADIATION RETINOPATHY W/ HEMORRHAGE  . REPAIR UNDESENDED RIGHT TESTICLE / RIGHT INGUINAL HERNIA  AGE 51  . RIGHT ANKLE ARTHROSCOPY W/ EXTENSIVE DEBRIDEMENT  04-05-2008   . RIGHT ELBOW ULNA NERVE RELEASE TRANSPOSTION AND MEDIAL EPICONDYLAR DEBRIDEMENT AND REPAIR Right 04/28/2014   Performed by Linna Hoff, MD at [facility]  . RIGHT SUPRAOMOHYOID NECK DISSECTION   03-08-2003   ZONES 1,2,3;   SUBMANDIBULAR MASS / METASTATIC SQUAMOUS CELL CARCINOMA RIGHT NECK  . SEPTOPLASTY  NOV 2006  . SHOULDER ARTHROSCOPY W/ SUBACROMIAL DECOMPRESSION AND DISTAL CLAVICLE EXCISION  10-09-2008   AND DEBRIDEMENT OF RIGHT SHOULDER IMPINGEMENT & [facility] JOINT ARTHRITIS  . SPINE SURGERY    . TOTAL THYROIDECTOMY  11-03-2001   PAPILLARY THYROID CARCINOMA  . TRANSTHORACIC ECHOCARDIOGRAM  12/ 2012   grade I diastolic dysfunction/ ef 16-96%    FAMILY HISTORY Family History  Problem Relation Age of Onset  . Depression Sister   . Rectal cancer Sister   . Lung cancer Brother   . Kidney disease Mother   . Depression Mother   . Stroke Father   . COPD Father   . Hypertension Father   . Prostate cancer Father   . Depression Daughter   . Drug abuse Daughter   . Psychiatric Illness Son     SOCIAL HISTORY:  Alex [name] is disabled secondary to his multiple medical problems. His wife Alex [name] works for [employer]. 2 of his younger children are still home. One of his children unfortunately suffers from paraphilia . The ffourth child is  "our problem daughter" and she is also back home with the family.    ADVANCED DIRECTIVES: Not in place   HEALTH MAINTENANCE: Social History   Tobacco Use  . Smoking status: Never Smoker  . Smokeless tobacco: Never Used  Substance Use Topics  . Alcohol use: Yes    Alcohol/week: 0.0 oz    Comment: 1-2 glasses per month  . Drug use: No     Allergies  Allergen Reactions  . Percocet [Oxycodone-Acetaminophen] Itching    Can take generic.  States only has a problem with percocet brand    Current Outpatient Medications  Medication Sig Dispense Refill  . aspirin EC 81 MG tablet Take 1 tablet by mouth daily.    [name] atorvastatin (LIPITOR) 40 MG tablet Take 1 tablet (40 mg total) daily by mouth. Patient needs office visit before refills will be given 90 tablet 0  . AXIRON 30 MG/ACT SOLN Place 2 Act onto the skin daily.    . budesonide-formoterol (SYMBICORT) 80-4.5 MCG/ACT inhaler Inhale 2 puffs into the lungs 2 (two) times daily. (Patient taking differently: Inhale 2 puffs 2 (two) times daily into the lungs. ) 1 Inhaler 0  . Coenzyme Q10 (CO Q 10 PO) Take by mouth daily.    . fluconazole (DIFLUCAN) 150 MG tablet TAKE 1 TABLET BY MOUTH AS A SINGLE DOSE (Patient taking differently: TAKE 1 TABLET BY MOUTH AS A SINGLE DOSE) 4 tablet 2  . Immune Globulin, Human, (HIZENTRA) 4 GM/20ML SOLN Inject into the skin once a week. wednesday    . LamoTRIgine 300 MG TB24 Take 1 tablet (300 mg total) by mouth daily. 90 tablet 0  . levalbuterol (XOPENEX HFA) 45 MCG/ACT inhaler Inhale 2 puffs into the lungs every 6 (six) hours as needed for wheezing. 1 Inhaler 5  . levothyroxine (SYNTHROID) 150 MCG tablet Take 1 tablet (150 mcg total) by mouth daily. 90 tablet 3  . lidocaine-prilocaine (EMLA) cream as needed.    [name] LORazepam (ATIVAN) 0.5 MG tablet Take 1 tab daily as needed and 1 tab at bed time 60 tablet 1  . LUTEIN PO Take by mouth daily.    . OMEGA-3 KRILL OIL PO Take by mouth daily.    [name] Respiratory  Therapy Supplies (FLUTTER) DEVI Use as directed 1 each 0  . Spacer/Aero-Holding Chambers (AEROCHAMBER MV) inhaler Use as instructed 1 each 0  . sucralfate (CARAFATE) 1 G tablet Take 1 g by mouth as needed.   3  . telmisartan (MICARDIS) 80 MG tablet TAKE 1 TABLET (80 MG TOTAL) BY MOUTH DAILY. 90 tablet 3  . timolol (TIMOPTIC-XR) 0.25 % ophthalmic gel-forming INSTILL 1 DROP EVERY DAY INTO BOTH EYES  5  . zolpidem (AMBIEN) 10 MG tablet  Take 1 tablet (10 mg total) by mouth at bedtime as needed. for sleep 30 tablet 0  . hydrochlorothiazide (MICROZIDE) 12.5 MG capsule Take 1 capsule (12.5 mg total) by mouth daily. 90 capsule 3   No current facility-administered medications for this visit.     OBJECTIVE: Middle-aged white male who appears stated age  38:   06/27/17 1009  BP: (!) 172/118  Pulse: 82  Resp: 20  Temp: 97.6 F (36.4 C)  SpO2: 99%     Body mass index is 25.36 kg/m.    ECOG FS:1 - Symptomatic but completely ambulatory  Sclerae unicteric, EOMs intact Oropharynx clear and moist No cervical or supraclavicular adenopathy Lungs no rales or rhonchi Heart regular rate and rhythm Abd soft, nontender, positive bowel sounds MSK no focal spinal tenderness, no upper extremity lymphedema Neuro: nonfocal, well oriented, appropriate affect    LAB RESULTS:  CMP     Component Value Date/Time   NA 138 06/09/2017 1447   K 4.3 06/09/2017 1447   CL 99 06/09/2017 1447   CO2 21 06/09/2017 1447   GLUCOSE 96 06/09/2017 1447   GLUCOSE 101 (H) 01/22/2017 1056   GLUCOSE 95 06/04/2006 0825   BUN 17 06/09/2017 1447   CREATININE 0.97 06/09/2017 1447   CREATININE 1.03 07/23/2016 1111   CALCIUM 8.5 (L) 06/09/2017 1447   PROT 6.1 01/22/2017 1056   ALBUMIN 4.2 01/22/2017 1056   AST 32 01/22/2017 1056   ALT 37 01/22/2017 1056   ALKPHOS 69 01/22/2017 1056   BILITOT 0.7 01/22/2017 1056   GFRNONAA 83 06/09/2017 1447   GFRAA 96 06/09/2017 1447    INo results found for: SPEP, UPEP  Lab  Results  Component Value Date   WBC 11.5 (H) 06/27/2017   NEUTROABS 7.5 (H) 06/27/2017   HGB 17.3 (H) 06/27/2017   HCT 53.0 (H) 06/27/2017   MCV 81.9 06/27/2017   PLT 272 06/27/2017      Chemistry      Component Value Date/Time   NA 138 06/09/2017 1447   K 4.3 06/09/2017 1447   CL 99 06/09/2017 1447   CO2 21 06/09/2017 1447   BUN 17 06/09/2017 1447   CREATININE 0.97 06/09/2017 1447   CREATININE 1.03 07/23/2016 1111      Component Value Date/Time   CALCIUM 8.5 (L) 06/09/2017 1447   ALKPHOS 69 01/22/2017 1056   AST 32 01/22/2017 1056   ALT 37 01/22/2017 1056   BILITOT 0.7 01/22/2017 1056       No results found for: LABCA2  No components found for: LABCA125  No results for input(s): INR in the last 168 hours.  Urinalysis    Component Value Date/Time   COLORURINE YELLOW 01/22/2017 1056   APPEARANCEUR CLEAR 01/22/2017 1056   LABSPEC 1.025 01/22/2017 1056   PHURINE 6.0 01/22/2017 1056   GLUCOSEU NEGATIVE 01/22/2017 1056   HGBUR SMALL (A) 01/22/2017 1056   BILIRUBINUR NEGATIVE 01/22/2017 1056   KETONESUR NEGATIVE 01/22/2017 1056   PROTEINUR NEGATIVE 07/23/2016 1111   UROBILINOGEN 0.2 01/22/2017 1056   NITRITE NEGATIVE 01/22/2017 1056   LEUKOCYTESUR NEGATIVE 01/22/2017 1056    STUDIES: Mr Liver W Wo Contrast  Result Date: 06/23/2017 CLINICAL DATA:  1.6 cm enhancing lesion posteriorly in the right hepatic lobe is nonspecific on recent chest CT, for further workup. EXAM: MRI ABDOMEN WITHOUT AND WITH CONTRAST TECHNIQUE: Multiplanar multisequence MR imaging of the abdomen was performed both before and after the administration of intravenous contrast. CONTRAST:  61mL MULTIHANCE GADOBENATE DIMEGLUMINE 529  MG/ML IV SOLN COMPARISON:  Multiple exams, including 06/13/2017 chest CT scan chest CT FINDINGS: Lower chest: Unremarkable Hepatobiliary: The lesion of concern in segment 6 of the liver posteriorly in the right hepatic lobe is oval-shaped and measures 1.6 by 1.3 cm on  image 22/9, with low T1 signal characteristics and generally low T2 signal characteristics but accentuated gradient signal. The lesion has notable portal venous and hepatic venous branch attachment and appears to follow the adjacent vasculature in terms of signal intensity. On the early arterial phase images were there is little in the way of hepatic venous contrast, the lesion itself only has a very limited amount of enhancement along the periphery. On T2 weighted image 16 of series 5 lesion appears to have a rim of higher signal intensity but central lower signal intensity. The appearance is most consistent with a portohepatic venous shunt. These can be seen in normal patients and in patients with portal venous hypertension. Several additional small hepatic cysts are present. Mildly contracted gallbladder. No biliary dilatation. Pancreas:  Unremarkable Spleen:  Unremarkable Adrenals/Urinary Tract:  Unremarkable Stomach/Bowel: Unremarkable Vascular/Lymphatic:  Unremarkable Other:  No supplemental non-categorized findings. Musculoskeletal: Levoconvex lumbar scoliosis with rotary component. Posterolateral rod and pedicle screw fixation at L3-4. IMPRESSION: 1. The lesion of concern in segment 6 of the liver has single large portal vein and hepatic vein branches extending to tt, in a pattern of enhancement which mirrors these vascular structures. 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 These can be seen in normal patients and also on patient's with portal venous hypertension and in this case the lesion is a small aneurysmal collection measuring 1.6 by 1.3 cm. Larger vascular malformations can cause risk of encephalopathy and surgical closure in such cases is often warranted ; in smaller lesions such as this one, surveillance is likely warranted and might be reasonably accomplished by periodic hepatic ultrasound with attention directed to the lesion, which should be very  visible on Doppler assessment. Although I am unaware of specific guidelines for following up such lesions, I would suggest an initial six-month follow up by ultrasound. 2. Levoconvex lumbar scoliosis. 3. Several incidental hepatic cysts. Electronically Signed   By: Van Clines [name]   On: 06/23/2017 08:30   Ct Angio Chest Aorta W &/or Wo Contrast  Result Date: 06/14/2017 CLINICAL DATA:  Followup ascending thoracic aortic aneurysm. EXAM: CT ANGIOGRAPHY CHEST WITH CONTRAST TECHNIQUE: Multidetector CT imaging of the chest was performed using the standard protocol during bolus administration of intravenous contrast. Multiplanar CT image reconstructions and MIPs were obtained to evaluate the vascular anatomy. CONTRAST:  80 cc Isovue 370 COMPARISON:  03/19/2016, 10/28/2014, 04/20/2012 and 01/09/2006. FINDINGS: Cardiovascular: In the coronal plane, the ascending thoracic aorta previously measured 4.1 cm in maximum diameter and currently measures 3.9 cm and maximum corresponding diameter on image number 56 series 9. In the axial plane, the ascending thoracic aorta measures 4.3 cm in maximum diameter on image number 144 series 5. Mild narrowing of the aortic arch at the level of the ligamentum arteriosum. Borderline enlarged heart. Small amount of aortic calcification. Mediastinum/Nodes: Status post thyroidectomy with surgical clips. No enlarged lymph nodes. Lungs/Pleura: Minimal bilateral dependent atelectasis. No lung nodules or pleural fluid. Upper Abdomen: Multiple liver cysts without significant change. There is also a 1.6 cm rounded area of enhancement in the posterior segment of the right lobe of the liver with a degree of enhancement addendum will to the portal veins on image number 10 series  11 the corresponding image on the previous examinations. Musculoskeletal: Old, healed bilateral rib fractures. Thoracic and lower cervical spine degenerative changes. Review of the MIP images confirms the above  findings. IMPRESSION: 1. No significant change in mild aneurysmal dilatation of the ascending thoracic aorta. Recommend annual imaging followup by CTA or MRA. This recommendation follows 2010 ACCF/AHA/AATS/ACR/ASA/SCA/SCAI/SIR/STS/SVM Guidelines for the Diagnosis and Management of Patients with Thoracic Aortic Disease. Circulation. 2010; 121: C166-A630 2. 1.6 cm rounded enhancing area in the posterior segment of the right lobe of the liver. This has vessels leading into and out of it, suggesting a vascular malformation. A hyperenhancing mass is also a possibility. Pre and postcontrast magnetic resonance imaging of the liver may provide useful additional information. 3. Minimal atheromatous aortic calcification. 4. No acute abnormality. Aortic Atherosclerosis (ICD10-I70.0). Electronically Signed   By: Claudie Revering [name]   On: 06/14/2017 13:17    ASSESSMENT: 61 y.o. [place] man  (1) status post right periorbital biopsy of the squamous cell carcinoma in November 2002, status post resection and 64 gray of radiation completed February 2003  (2) recurrence to the right perirbital lymph node drainage area, status post right supraomohyoid neck dissection July 2004 with 1 of 8 lymph nodes positive  (a) completed adjuvant radiation October 2004, 49 Gy to the upper neck, given with sensitizing Erbitux and cisplatin  (3) status post thyroidectomy for papillary thyroid carcinoma, with postoperative iodine-131 May 2003  (a) on chronic Synthroid supplementation, most recent TSH 1.73 (06/06/2014)  (4) likely alpha thalassemia trait with baseline MCV 79-81 in setting of normal hemoglobin  (5) iron deficiency secondary to gastritis/esophagitis, s/p feraheme, last dose 10/27/2015  PLAN: Alex [name] remains very anxious about the possibility of cancer and of course he has had some skin cancers removed but I reassured him that those were not invasive.  I reassured him that the findings in his liver are also not cancer.  He  is expecting to have some eye surgery and will need periorbital MRI to make sure there is no disease recurrence there so this has been scheduled.  His hemoglobin is slightly up.  He is not a smoker.  He might have sleep apnea.  This could be a result of his testosterone treatment.  At any rate this only requires follow-up and I am repeating his lab work in January.  Otherwise she will see me again in 1 year.  He knows to call for any other issues that may develop before the next visit. Magrinat, Virgie Dad, MD  06/27/17 10:27 AM Medical Oncology and Hematology [facility] [address] [place], [place] [address] Tel. [phone]    Fax. [phone]    This document serves as a record of services personally performed by Lurline Del, MD. It was created on his behalf by Steva Colder, a trained medical scribe. The creation of this record is based on the scribe's personal observations and the provider's statements to them.   I have reviewed the above documentation for accuracy and completeness, and I agree with the above.

## 2017-06-27 ENCOUNTER — Other Ambulatory Visit: Payer: Self-pay

## 2017-06-27 ENCOUNTER — Telehealth: Payer: Self-pay | Admitting: Oncology

## 2017-06-27 ENCOUNTER — Other Ambulatory Visit (HOSPITAL_BASED_OUTPATIENT_CLINIC_OR_DEPARTMENT_OTHER): Payer: 59

## 2017-06-27 ENCOUNTER — Telehealth (HOSPITAL_COMMUNITY): Payer: Self-pay

## 2017-06-27 ENCOUNTER — Ambulatory Visit (HOSPITAL_BASED_OUTPATIENT_CLINIC_OR_DEPARTMENT_OTHER): Payer: 59 | Admitting: Oncology

## 2017-06-27 VITALS — BP 172/118 | HR 82 | Temp 97.6°F | Resp 20 | Ht 71.0 in | Wt 181.8 lb

## 2017-06-27 DIAGNOSIS — Z85850 Personal history of malignant neoplasm of thyroid: Secondary | ICD-10-CM | POA: Diagnosis not present

## 2017-06-27 DIAGNOSIS — E291 Testicular hypofunction: Secondary | ICD-10-CM

## 2017-06-27 DIAGNOSIS — C6960 Malignant neoplasm of unspecified orbit: Secondary | ICD-10-CM

## 2017-06-27 DIAGNOSIS — C73 Malignant neoplasm of thyroid gland: Secondary | ICD-10-CM

## 2017-06-27 DIAGNOSIS — C44329 Squamous cell carcinoma of skin of other parts of face: Secondary | ICD-10-CM

## 2017-06-27 LAB — COMPREHENSIVE METABOLIC PANEL
ALT: 40 U/L (ref 0–55)
AST: 26 U/L (ref 5–34)
Albumin: 4.1 g/dL (ref 3.5–5.0)
Alkaline Phosphatase: 74 U/L (ref 40–150)
Anion Gap: 11 mEq/L (ref 3–11)
BUN: 19.2 mg/dL (ref 7.0–26.0)
CO2: 23 mEq/L (ref 22–29)
Calcium: 8.8 mg/dL (ref 8.4–10.4)
Chloride: 105 mEq/L (ref 98–109)
Creatinine: 1.1 mg/dL (ref 0.7–1.3)
EGFR: >60 mL/min/{1.73_m2} (ref >60)
Glucose: 93 mg/dl (ref 70–140)
Potassium: 4 mEq/L (ref 3.5–5.1)
Sodium: 139 mEq/L (ref 136–145)
Total Bilirubin: 1.05 mg/dL (ref 0.20–1.20)
Total Protein: 6.9 g/dL (ref 6.4–8.3)

## 2017-06-27 LAB — CBC WITH DIFFERENTIAL/PLATELET
BASO%: 0.2 % (ref 0.0–2.0)
Basophils Absolute: 0 10*3/uL (ref 0.0–0.1)
EOS%: 3.2 % (ref 0.0–7.0)
Eosinophils Absolute: 0.4 10*3/uL (ref 0.0–0.5)
HCT: 53 % — ABNORMAL HIGH (ref 38.4–49.9)
HGB: 17.3 g/dL — ABNORMAL HIGH (ref 13.0–17.1)
LYMPH%: 21 % (ref 14.0–49.0)
MCH: 26.6 pg — ABNORMAL LOW (ref 27.2–33.4)
MCHC: 32.5 g/dL (ref 32.0–36.0)
MCV: 81.9 fL (ref 79.3–98.0)
MONO#: 1.2 10*3/uL — ABNORMAL HIGH (ref 0.1–0.9)
MONO%: 10.3 % (ref 0.0–14.0)
NEUT#: 7.5 10*3/uL — ABNORMAL HIGH (ref 1.5–6.5)
NEUT%: 65.3 % (ref 39.0–75.0)
Platelets: 272 10*3/uL (ref 140–400)
RBC: 6.48 10*6/uL — ABNORMAL HIGH (ref 4.20–5.82)
RDW: 16.3 % — ABNORMAL HIGH (ref 11.0–14.6)
WBC: 11.5 10*3/uL — ABNORMAL HIGH (ref 4.0–10.3)
lymph#: 2.4 10*3/uL (ref 0.9–3.3)

## 2017-06-27 NOTE — Telephone Encounter (Signed)
Patient declined AVS and calendar. Patient will receive update in Mychart.

## 2017-06-27 NOTE — Telephone Encounter (Signed)
Medication refill request - Fax received from patient's [facility] pharmacy requesting a refill of his prescribed Lorazepam, last ordered 03/26/17 + 1 refill. Patient returns for next eval 06/30/17.

## 2017-06-27 NOTE — Telephone Encounter (Signed)
Okay to refill until next appointment

## 2017-06-30 ENCOUNTER — Encounter (HOSPITAL_COMMUNITY): Payer: Self-pay | Admitting: Psychiatry

## 2017-06-30 ENCOUNTER — Ambulatory Visit (INDEPENDENT_AMBULATORY_CARE_PROVIDER_SITE_OTHER): Payer: 59 | Admitting: Psychiatry

## 2017-06-30 DIAGNOSIS — F411 Generalized anxiety disorder: Secondary | ICD-10-CM | POA: Diagnosis not present

## 2017-06-30 DIAGNOSIS — Z818 Family history of other mental and behavioral disorders: Secondary | ICD-10-CM

## 2017-06-30 DIAGNOSIS — F3162 Bipolar disorder, current episode mixed, moderate: Secondary | ICD-10-CM | POA: Diagnosis not present

## 2017-06-30 DIAGNOSIS — Z598 Other problems related to housing and economic circumstances: Secondary | ICD-10-CM | POA: Diagnosis not present

## 2017-06-30 DIAGNOSIS — Z79899 Other long term (current) drug therapy: Secondary | ICD-10-CM

## 2017-06-30 DIAGNOSIS — F319 Bipolar disorder, unspecified: Secondary | ICD-10-CM

## 2017-06-30 DIAGNOSIS — Z813 Family history of other psychoactive substance abuse and dependence: Secondary | ICD-10-CM

## 2017-06-30 MED ORDER — LORAZEPAM 0.5 MG PO TABS: ORAL_TABLET | ORAL | 2 refills | Status: DC

## 2017-06-30 MED ORDER — LAMOTRIGINE ER 300 MG PO TB24: 1.0000 | ORAL_TABLET | Freq: Every day | ORAL | 0 refills | Status: DC

## 2017-06-30 MED ORDER — ZOLPIDEM TARTRATE 10 MG PO TABS: 10.0000 mg | ORAL_TABLET | Freq: Every evening | ORAL | 0 refills | Status: DC | PRN

## 2017-06-30 NOTE — Progress Notes (Signed)
BH MD/PA/NP OP Progress Note  06/30/2017 1:44 PM Alex [name]  MRN:  [id number]  Chief Complaint: I am nervous but I am doing okay.  HPI: Patient came for his follow-up appointment.  He has been anxious and nervous because of financial issues.  He decided to file bankruptcy because he has no choice.  He admitted taking lorazepam twice a day because sometimes he has difficulty falling asleep.  He is happy because his daughter with her 3 children coming for Thanksgiving.  After a long time he is going to have a big family reunion.  He continues to be concerned about his son Alex [name] who recently took overdose on [place] but did not require hospitalization.  Patient told his son bounce back and he is doing much better and continues to work.  He does not want to change his medication.  He denies any tremors, shakes or any EPS.  He denies any rash or itching.  Denies any major panic attack but admitted sometimes feels nervous and anxious.  His energy level is fair.  Denies any mania, psychosis, hallucination or any aggressive behavior.  His appetite is okay.  His vital signs are stable.  Patient denies drinking alcohol or using any illegal substances.  Visit Diagnosis:    ICD-10-CM   1. Bipolar 1 disorder (HCC) F31.9 LORazepam (ATIVAN) 0.5 MG tablet  2. Bipolar 1 disorder, mixed, moderate (HCC) F31.62 zolpidem (AMBIEN) 10 MG tablet  3. Bipolar I disorder (HCC) F31.9 LamoTRIgine 300 MG TB24 24 hour tablet    Past Psychiatric History: Reviewed. Patient has been seeing in this office since 2009. He has history of depression mood swing and anger. He's been admitted to behavioral [place] due to suicidal thoughts but he denies any suicidal attempt. He denies any psychosis but admitted poor impulse control. In the past he has taken Seroquel and Cymbalta.   Past Medical History:  Past Medical History:  Diagnosis Date  . Anxiety   . At risk for sleep apnea    STOP-BANG= 5    SENT TO PCP  04-22-2014  . Bipolar I disorder ([place])   . Bleeding ulcer 06/2014  . BPH (benign prostatic hypertrophy) with urinary obstruction   . Chronic back pain   . Complication of anesthesia POST URINARY RETENTION---  2006 SHOULDER SURGERY MARKED BRADYCARDIA VAGAL RESPONSE NO ISSUE W/ SURGERY AFTER THIS ONE  . Coronary atherosclerosis CARDIOLOGIST- DR CRENSHAW--  LAST VISIT 01-05-2012 IN EPIC   NON-OBSTRUCTIVE MILD DISEASE  . 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 Depression   . Epicondylitis    right elbow  . GERD (gastroesophageal reflux disease)   . Glaucoma BOTH EYES -- NO DROPS   . History of chronic prostatitis   . 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 History of thyroid cancer PRIMARY (NO METS FROM ORBITAL CANCER)--   IN REMISSION   S/P TOTAL THYROIDECTOMY  , CHEMORADIATION  (ONCOLOGIST -- DR Griffith Citron)  . Hyperlipidemia   . Hypertension   . Immune deficiency disorder ([place])    COMMON VARIABLE  . Nocturia   . OA (osteoarthritis)   . Positional vertigo   . Renal calculi LEFT KIDNEY-- NON-OBSTRUCTIVE  . Ulnar nerve compression    right elbow  . Urinary hesitancy  Past Surgical History:  Procedure Laterality Date  . ARTHROSCOPY KNEE Left 05/01/2012   Performed by Johnn Hai, MD at [facility]  . CARDIAC CATHETERIZATION  01-16-2006  DR Marcello Moores WALL   MILD CORONARY ATHEROSCLEROSIS/ MID TO DISTAL LAD 40% STENOSIS/ LVF 50-55%  . LEFT ANKLE ARTHROSCOPY W/ DEBRIDEMENT  05-12-2007  . LEFT HYDROCELECTOMY  03-29-2005   AND REPAIR LEFT INGUINAL HERNIA W/ MESH  . MOHS SURGERY  2002   RIGHT ORBITAL CANCER  . NASAL ENDOSCOPY  08-07-2005   RIGHT EPISTAXIS  / POST SEPTOPLASTY  (HX RIGHT ORBITAL CA & S/P RADIATION/ NECROSIS ANTERIOR END OF BOTH INFERIOR TURBINATES)  . occuloplastic surgery  2002  .  PARS PLANA VITRECTOMY  11-06-2004   RIGHT EYE RADIATION RETINOPATHY W/ HEMORRHAGE  . REPAIR UNDESENDED RIGHT TESTICLE / RIGHT INGUINAL HERNIA  AGE 3  . RIGHT ANKLE ARTHROSCOPY W/ EXTENSIVE DEBRIDEMENT  04-05-2008   . RIGHT ELBOW ULNA NERVE RELEASE TRANSPOSTION AND MEDIAL EPICONDYLAR DEBRIDEMENT AND REPAIR Right 04/28/2014   Performed by Linna Hoff, MD at [facility]  . RIGHT SUPRAOMOHYOID NECK DISSECTION   03-08-2003   ZONES 1,2,3;   SUBMANDIBULAR MASS / METASTATIC SQUAMOUS CELL CARCINOMA RIGHT NECK  . SEPTOPLASTY  NOV 2006  . SHOULDER ARTHROSCOPY W/ SUBACROMIAL DECOMPRESSION AND DISTAL CLAVICLE EXCISION  10-09-2008   AND DEBRIDEMENT OF RIGHT SHOULDER IMPINGEMENT & [facility] JOINT ARTHRITIS  . SPINE SURGERY    . TOTAL THYROIDECTOMY  11-03-2001   PAPILLARY THYROID CARCINOMA  . TRANSTHORACIC ECHOCARDIOGRAM  12/ 2012   grade I diastolic dysfunction/ ef 17-61%    Family Psychiatric History: Reviewed.  Family History:  Family History  Problem Relation Age of Onset  . Depression Sister   . Rectal cancer Sister   . Lung cancer Brother   . Kidney disease Mother   . Depression Mother   . Stroke Father   . COPD Father   . Hypertension Father   . Prostate cancer Father   . Depression Daughter   . Drug abuse Daughter   . Psychiatric Illness Son     Social History:  Social History   Socioeconomic History  . Marital status: Married    Spouse name: None  . Number of children: None  . Years of education: None  . Highest education level: None  Social Needs  . Financial resource strain: None  . Food insecurity - worry: None  . Food insecurity - inability: None  . Transportation needs - medical: None  . Transportation needs - non-medical: None  Occupational History  . None  Tobacco Use  . Smoking status: Never Smoker  . Smokeless tobacco: Never Used  Substance and Sexual Activity  . Alcohol use: Yes    Alcohol/week: 0.0 oz    Comment: 1-2 glasses per month  .  Drug use: No  . Sexual activity: Yes    Partners: Female    Birth control/protection: None  Other Topics Concern  . None  Social History Narrative   Originally from [place]. Moved to [place] in 1984. 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 No international travel other than [place]. Has a dog at home, poodle mix. His daughter who lives with him now has a dog. No mold exposure. No bird exposure. No hot tub exposure. Enjoys gardening.     Allergies:  Allergies  Allergen Reactions  . Percocet [Oxycodone-Acetaminophen] Itching    Can take generic.  States only has a problem with percocet brand    Metabolic Disorder Labs: Lab Results  Component Value Date   HGBA1C 6.1 08/04/2012   MPG 123 (H) 01/05/2012   No results found for: PROLACTIN Lab Results  Component Value Date   CHOL 151 07/23/2016   TRIG 175 (H) 07/23/2016   HDL 74 07/23/2016   CHOLHDL 2.0 07/23/2016   VLDL 35 (H) 07/23/2016   LDLCALC 42 07/23/2016   LDLCALC 86 08/01/2015   Lab Results  Component Value Date   TSH 3.10 01/22/2017   TSH 3.50 07/23/2016    Therapeutic Level Labs: No results found for: LITHIUM No results found for: VALPROATE No components found for:  CBMZ  Current Medications: Current Outpatient Medications  Medication Sig Dispense Refill  . aspirin EC 81 MG tablet Take 1 tablet by mouth daily.    [name] atorvastatin (LIPITOR) 40 MG tablet Take 1 tablet (40 mg total) daily by mouth. Patient needs office visit before refills will be given 90 tablet 0  . AXIRON 30 MG/ACT SOLN Place 2 Act onto the skin daily.    . budesonide-formoterol (SYMBICORT) 80-4.5 MCG/ACT inhaler Inhale 2 puffs into the lungs 2 (two) times daily. (Patient taking differently: Inhale 2 puffs 2 (two) times daily into the lungs. ) 1 Inhaler 0  . Coenzyme Q10 (CO Q 10 PO) Take by mouth daily.    .  fluconazole (DIFLUCAN) 150 MG tablet TAKE 1 TABLET BY MOUTH AS A SINGLE DOSE (Patient taking differently: TAKE 1 TABLET BY MOUTH AS A SINGLE DOSE) 4 tablet 2  . Immune Globulin, Human, (HIZENTRA) 4 GM/20ML SOLN Inject into the skin once a week. wednesday    . LamoTRIgine 300 MG TB24 Take 1 tablet (300 mg total) by mouth daily. 90 tablet 0  . levalbuterol (XOPENEX HFA) 45 MCG/ACT inhaler Inhale 2 puffs into the lungs every 6 (six) hours as needed for wheezing. 1 Inhaler 5  . levothyroxine (SYNTHROID) 150 MCG tablet Take 1 tablet (150 mcg total) by mouth daily. 90 tablet 3  . LORazepam (ATIVAN) 0.5 MG tablet Take 1 tab daily as needed and 1 tab at bed time 60 tablet 1  . LUTEIN PO Take by mouth daily.    . OMEGA-3 KRILL OIL PO Take by mouth daily.    [name] Respiratory Therapy Supplies (FLUTTER) DEVI Use as directed 1 each 0  . Spacer/Aero-Holding Chambers (AEROCHAMBER MV) inhaler Use as instructed 1 each 0  . sucralfate (CARAFATE) 1 G tablet Take 1 g by mouth as needed.   3  . telmisartan (MICARDIS) 80 MG tablet TAKE 1 TABLET (80 MG TOTAL) BY MOUTH DAILY. 90 tablet 3  . zolpidem (AMBIEN) 10 MG tablet Take 1 tablet (10 mg total) by mouth at bedtime as needed. for sleep 30 tablet 0  . hydrochlorothiazide (MICROZIDE) 12.5 MG capsule Take 1 capsule (12.5 mg total) by mouth daily. 90 capsule 3  . latanoprost (XALATAN) 0.005 % ophthalmic solution USE 1 DROP INTO BOTH EYES [facility] NIGHT  11  . lidocaine-prilocaine (EMLA) cream as needed.    . timolol (TIMOPTIC-XR) 0.25 % ophthalmic gel-forming INSTILL 1 DROP EVERY DAY INTO BOTH EYES  5   No current facility-administered medications for this visit.      Musculoskeletal: Strength & Muscle Tone: within normal limits Gait & Station: normal Patient leans: N/A  Psychiatric Specialty Exam: ROS  Blood pressure 112/84, pulse 83, height  5\' 11"  (1.803 m), weight 177 lb (80.3 kg).Body mass index is 24.69 kg/m.  General Appearance: Casual  Eye Contact:  Good   Speech:  Clear and Coherent  Volume:  Normal  Mood:  Anxious  Affect:  Appropriate  Thought Process:  Goal Directed  Orientation:  Full (Time, Place, and Person)  Thought Content: Logical   Suicidal Thoughts:  No  Homicidal Thoughts:  No  Memory:  Immediate;   Good Recent;   Good Remote;   Good  Judgement:  Good  Insight:  Good  Psychomotor Activity:  Normal  Concentration:  Concentration: Fair and Attention Span: Fair  Recall:  Good  Fund of Knowledge: Good  Language: Good  Akathisia:  No  Handed:  Right  AIMS (if indicated): not done  Assets:  Communication Skills Desire for Improvement Housing Resilience  ADL's:  Intact  Cognition: WNL  Sleep:  Fair   Screenings:   Assessment and Plan: Bipolar disorder type I.  Generalized anxiety disorder.  Despite psychosocial stressors he is doing better.  Due to financial stress he decided to file bankruptcy.  He wants to continue his current medication.  Continue Ambien 5-10 mg at bedtime and lorazepam 0.5 mg twice a day as needed.  Continue Lamictal 300 mg daily.  He has no rash, itching tremors or shakes.  Discussed medication side effect especially benzodiazepine dependence tolerance and withdrawal.  Recommended to call us back if there is any question or any concern.  He is not interested in counseling.  Follow-up in 3 months.   Kathlee Nations, MD 06/30/2017, 1:44 PM

## 2017-07-01 ENCOUNTER — Other Ambulatory Visit (HOSPITAL_COMMUNITY): Payer: Self-pay

## 2017-07-07 ENCOUNTER — Encounter (HOSPITAL_COMMUNITY): Payer: Self-pay | Admitting: Licensed Clinical Social Worker

## 2017-07-07 ENCOUNTER — Ambulatory Visit (HOSPITAL_COMMUNITY): Payer: 59 | Admitting: Licensed Clinical Social Worker

## 2017-07-07 DIAGNOSIS — F319 Bipolar disorder, unspecified: Secondary | ICD-10-CM

## 2017-07-07 DIAGNOSIS — F3162 Bipolar disorder, current episode mixed, moderate: Secondary | ICD-10-CM | POA: Diagnosis not present

## 2017-07-07 NOTE — Progress Notes (Signed)
Therapist Progress Note   Time: 4:10-5:00 pm  Participation Level: Active  Behavioral Response: Casual, Alert, Anxious  Type of Therapy: Individual Therapy  Treatment Goals Addressed: Coping  Interventions: Supportive/ CBT  Summary: Pt presented for individual therapy. Pt discussed his psychiatric symptoms and current life events.  Pt discussed his thanksgiving holiday surrounded by his family. His son is out of [facility] but still continues with his chronic self mutilation. Pt again talked about his financial situation and not being heard by his wife.These are his same stressors. Problem solve with pt at each session but pt follows through minimally,  Pt still feels hopeless and helpless about his son. Discussed options of letting his son go vs planning his future with the responsibility of his son. Suggested to pt he can bring in his wife for a session if he would like to discuss options for his son with mental illness.      Suicidal/Homicidal: No  Therapist Response: Assessed pt's current functioning and reviewed progress. Assisted pt processing issues with son, feelings of hopelessness, supports,  and processing for management of stressors.  Plan: Return in 2 weeks using CBT  Diagnosis: Axis 1: Bipolar 1 disorder, recent episode mixed, moderate  [name], LCAS 07/07/17

## 2017-07-23 ENCOUNTER — Ambulatory Visit: Payer: Self-pay

## 2017-07-23 NOTE — Telephone Encounter (Signed)
Pt states 2 days ago he was shoveling snow and developed pain in his groin  (left testicle) and lower back muscle. Pt states pain is constant and made worse with activities. Pt states he has a h/o of hydrocele and hernia repair. Pt states the it feels like an constant ache or a squeeze to the left testicle. Protocol advised to go to ED. Pt stated that he will be going to make an appt with [facility]. NT called office and spoke with Tammy and informed her of situation. Asked NT to route call back to office.  Reason for Disposition . [1] Constant pain in scrotum or testicle AND [2] present > 1 hour  Answer Assessment - Initial Assessment Questions 1. LOCATION and RADIATION: "Where is the pain located?"      Left testicle up the groin area ending at the level of the bladder 2. QUALITY: "What does the pain feel like?"  (e.g., sharp, dull, aching, burning)     Achy worsens with movements feels like someone is squeezing the testicle 3. SEVERITY: "How bad is the pain?"  (Scale 1-10; or mild, moderate, severe)   - MILD (1-3): doesn't interfere with normal activities    - MODERATE (4-7): interferes with normal activities (e.g., work or school) or awakens from sleep   - SEVERE (8-10): excruciating pain, unable to do any normal activities, difficulty walking     2-6 depending Movement makes it worse 4. ONSET: "When did the pain start?"     2 days ago 5. PATTERN: "Does it come and go, or has it been constant since it started?"     Constant with movement  When lying still pain subsides 6. SCROTAL APPEARANCE: "What does the scrotum look like?" "Is there any swelling or redness?"      No redness and swelling just tender to touch 7. HERNIA: "Has a doctor ever told you that you have a hernia?"     Had h/o hydrocele left repair and hernia repair left side  8. OTHER SYMPTOMS: "Do you have any other symptoms?" (e.g., fever, abdominal pain, vomiting, difficulty passing urine)     no  Protocols  used: SCROTAL PAIN-A-AH

## 2017-07-25 NOTE — Telephone Encounter (Signed)
Spoke with pt and pt was informed if he gets worse to call and set up an appt with [place]

## 2017-07-28 ENCOUNTER — Ambulatory Visit (HOSPITAL_COMMUNITY): Payer: Self-pay | Admitting: Licensed Clinical Social Worker

## 2017-07-31 ENCOUNTER — Other Ambulatory Visit (HOSPITAL_COMMUNITY): Payer: Self-pay | Admitting: Psychiatry

## 2017-07-31 DIAGNOSIS — F3162 Bipolar disorder, current episode mixed, moderate: Secondary | ICD-10-CM

## 2017-08-01 ENCOUNTER — Other Ambulatory Visit (HOSPITAL_COMMUNITY): Payer: Self-pay | Admitting: Psychiatry

## 2017-08-01 ENCOUNTER — Encounter: Payer: Self-pay | Admitting: Internal Medicine

## 2017-08-01 ENCOUNTER — Ambulatory Visit (INDEPENDENT_AMBULATORY_CARE_PROVIDER_SITE_OTHER): Payer: 59 | Admitting: Internal Medicine

## 2017-08-01 DIAGNOSIS — M5441 Lumbago with sciatica, right side: Secondary | ICD-10-CM

## 2017-08-01 DIAGNOSIS — M5442 Lumbago with sciatica, left side: Secondary | ICD-10-CM

## 2017-08-01 DIAGNOSIS — N50811 Right testicular pain: Secondary | ICD-10-CM | POA: Diagnosis not present

## 2017-08-01 DIAGNOSIS — F411 Generalized anxiety disorder: Secondary | ICD-10-CM

## 2017-08-01 DIAGNOSIS — G8929 Other chronic pain: Secondary | ICD-10-CM

## 2017-08-01 MED ORDER — MELOXICAM 15 MG PO TABS: 15.0000 mg | ORAL_TABLET | Freq: Every day | ORAL | 2 refills | Status: DC

## 2017-08-01 NOTE — Telephone Encounter (Signed)
Encounter not needed . Closed

## 2017-08-01 NOTE — Progress Notes (Signed)
Subjective:  Patient ID: Alex [name], male    DOB: 07-04-1956  Age: 61 y.o. MRN: [id number]  CC: No chief complaint on file.   HPI Alex [name] presents for back and R groin pain after shoveling snow 1 week ago. F/u anxiety, testis pain - not new  Outpatient Medications Prior to Visit  Medication Sig Dispense Refill  . aspirin EC 81 MG tablet Take 1 tablet by mouth daily.    [name] atorvastatin (LIPITOR) 40 MG tablet Take 1 tablet (40 mg total) daily by mouth. Patient needs office visit before refills will be given 90 tablet 0  . AXIRON 30 MG/ACT SOLN Place 2 Act onto the skin daily.    . budesonide-formoterol (SYMBICORT) 80-4.5 MCG/ACT inhaler Inhale 2 puffs into the lungs 2 (two) times daily. (Patient taking differently: Inhale 2 puffs 2 (two) times daily into the lungs. ) 1 Inhaler 0  . Coenzyme Q10 (CO Q 10 PO) Take by mouth daily.    . Immune Globulin, Human, (HIZENTRA) 4 GM/20ML SOLN Inject into the skin once a week. wednesday    . LamoTRIgine 300 MG TB24 24 hour tablet Take 1 tablet (300 mg total) daily by mouth. 90 tablet 0  . latanoprost (XALATAN) 0.005 % ophthalmic solution USE 1 DROP INTO BOTH EYES ECH NIGHT  11  . levalbuterol (XOPENEX HFA) 45 MCG/ACT inhaler Inhale 2 puffs into the lungs every 6 (six) hours as needed for wheezing. 1 Inhaler 5  . levothyroxine (SYNTHROID) 150 MCG tablet Take 1 tablet (150 mcg total) by mouth daily. 90 tablet 3  . lidocaine-prilocaine (EMLA) cream as needed.    [name] LORazepam (ATIVAN) 0.5 MG tablet Take 1 tab daily as needed and 1 tab at bed time 60 tablet 2  . LUTEIN PO Take by mouth daily.    . OMEGA-3 KRILL OIL PO Take by mouth daily.    [name] Respiratory Therapy Supplies (FLUTTER) DEVI Use as directed 1 each 0  . Spacer/Aero-Holding Chambers (AEROCHAMBER MV) inhaler Use as instructed 1 each 0  . sucralfate (CARAFATE) 1 G tablet Take 1 g by mouth as needed.   3  . telmisartan (MICARDIS) 80 MG tablet TAKE 1 TABLET (80 MG TOTAL) BY MOUTH DAILY.  90 tablet 3  . timolol (TIMOPTIC-XR) 0.25 % ophthalmic gel-forming INSTILL 1 DROP EVERY DAY INTO BOTH EYES  5  . zolpidem (AMBIEN) 10 MG tablet Take 1 tablet (10 mg total) at bedtime as needed by mouth. for sleep 30 tablet 0  . fluconazole (DIFLUCAN) 150 MG tablet TAKE 1 TABLET BY MOUTH AS A SINGLE DOSE (Patient taking differently: TAKE 1 TABLET BY MOUTH AS A SINGLE DOSE) 4 tablet 2  . hydrochlorothiazide (MICROZIDE) 12.5 MG capsule Take 1 capsule (12.5 mg total) by mouth daily. 90 capsule 3   No facility-administered medications prior to visit.     ROS Review of Systems  Constitutional: Negative for appetite change, fatigue and unexpected weight change.  HENT: Negative for congestion, nosebleeds, sneezing, sore throat and trouble swallowing.   Eyes: Negative for itching and visual disturbance.  Respiratory: Negative for cough.   Cardiovascular: Negative for chest pain, palpitations and leg swelling.  Gastrointestinal: Negative for abdominal distention, blood in stool, diarrhea and nausea.  Genitourinary: Negative for frequency and hematuria.  Musculoskeletal: Positive for back pain and gait problem. Negative for joint swelling and neck pain.  Skin: Negative for rash.  Neurological: Negative for dizziness, tremors, speech difficulty and weakness.  Psychiatric/Behavioral: Positive for sleep disturbance.  Negative for agitation and dysphoric mood. The patient is nervous/anxious.     Objective:  BP (!) 160/110 (BP Location: Left Arm, Patient Position: Sitting, Cuff Size: Normal)   Pulse 83   Temp 98.4 F (36.9 C) (Oral)   Ht 5\' 11"  (1.803 m)   Wt 181 lb 4 oz (82.2 kg)   SpO2 98%   BMI 25.28 kg/m   BP Readings from Last 3 Encounters:  08/01/17 (!) 160/110  06/27/17 (!) 172/118  02/24/17 (!) 128/94    Wt Readings from Last 3 Encounters:  08/01/17 181 lb 4 oz (82.2 kg)  06/27/17 181 lb 12.8 oz (82.5 kg)  02/24/17 177 lb (80.3 kg)    Physical Exam  Constitutional: He is  oriented to person, place, and time. He appears well-developed. No distress.  NAD  HENT:  Mouth/Throat: Oropharynx is clear and moist.  Eyes: Conjunctivae are normal. Pupils are equal, round, and reactive to light.  Neck: Normal range of motion. No JVD present. No thyromegaly present.  Cardiovascular: Normal rate, regular rhythm, normal heart sounds and intact distal pulses. Exam reveals no gallop and no friction rub.  No murmur heard. Pulmonary/Chest: Effort normal and breath sounds normal. No respiratory distress. He has no wheezes. He has no rales. He exhibits no tenderness.  Abdominal: Soft. Bowel sounds are normal. He exhibits no distension and no mass. There is no tenderness. There is no rebound and no guarding.  Musculoskeletal: Normal range of motion. He exhibits tenderness. He exhibits no edema.  Lymphadenopathy:    He has no cervical adenopathy.  Neurological: He is alert and oriented to person, place, and time. He has normal reflexes. No cranial nerve deficit. He exhibits normal muscle tone. He displays a negative Romberg sign. Coordination and gait normal.  Skin: Skin is warm and dry. No rash noted.  Psychiatric: He has a normal mood and affect. His behavior is normal. Judgment and thought content normal.  R testis is atrophic and sensitive to palp; no mass L groin is sensitive and a little fuller compare to the L LS is tender w/ROM  Lab Results  Component Value Date   WBC 11.5 (H) 06/27/2017   HGB 17.3 (H) 06/27/2017   HCT 53.0 (H) 06/27/2017   PLT 272 06/27/2017   GLUCOSE 93 06/27/2017   CHOL 151 07/23/2016   TRIG 175 (H) 07/23/2016   HDL 74 07/23/2016   LDLDIRECT 59.0 02/02/2016   LDLCALC 42 07/23/2016   ALT 40 06/27/2017   AST 26 06/27/2017   NA 139 06/27/2017   K 4.0 06/27/2017   CL 99 06/09/2017   CREATININE 1.1 06/27/2017   BUN 19.2 06/27/2017   CO2 23 06/27/2017   TSH 3.10 01/22/2017   PSA 1.5 07/23/2016   HGBA1C 6.1 08/04/2012    Mr Liver W Wo  Contrast  Result Date: 06/23/2017 CLINICAL DATA:  1.6 cm enhancing lesion posteriorly in the right hepatic lobe is nonspecific on recent chest CT, for further workup. EXAM: MRI ABDOMEN WITHOUT AND WITH CONTRAST TECHNIQUE: Multiplanar multisequence MR imaging of the abdomen was performed both before and after the administration of intravenous contrast. CONTRAST:  61mL MULTIHANCE GADOBENATE DIMEGLUMINE 529 MG/ML IV SOLN COMPARISON:  Multiple exams, including 06/13/2017 chest CT scan chest CT FINDINGS: Lower chest: Unremarkable Hepatobiliary: The lesion of concern in segment 6 of the liver posteriorly in the right hepatic lobe is oval-shaped and measures 1.6 by 1.3 cm on image 22/9, with low T1 signal characteristics and generally low T2 signal characteristics  but accentuated gradient signal. The lesion has notable portal venous and hepatic venous branch attachment and appears to follow the adjacent vasculature in terms of signal intensity. On the early arterial phase images were there is little in the way of hepatic venous contrast, the lesion itself only has a very limited amount of enhancement along the periphery. On T2 weighted image 16 of series 5 lesion appears to have a rim of higher signal intensity but central lower signal intensity. The appearance is most consistent with a portohepatic venous shunt. These can be seen in normal patients and in patients with portal venous hypertension. Several additional small hepatic cysts are present. Mildly contracted gallbladder. No biliary dilatation. Pancreas:  Unremarkable Spleen:  Unremarkable Adrenals/Urinary Tract:  Unremarkable Stomach/Bowel: Unremarkable Vascular/Lymphatic:  Unremarkable Other:  No supplemental non-categorized findings. Musculoskeletal: Levoconvex lumbar scoliosis with rotary component. Posterolateral rod and pedicle screw fixation at L3-4. IMPRESSION: 1. The lesion of concern in segment 6 of the liver has single large portal vein and hepatic  vein branches extending to tt, in a pattern of enhancement which mirrors these vascular structures. 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 These can be seen in normal patients and also on patient's with portal venous hypertension and in this case the lesion is a small aneurysmal collection measuring 1.6 by 1.3 cm. Larger vascular malformations can cause risk of encephalopathy and surgical closure in such cases is often warranted ; in smaller lesions such as this one, surveillance is likely warranted and might be reasonably accomplished by periodic hepatic ultrasound with attention directed to the lesion, which should be very visible on Doppler assessment. Although I am unaware of specific guidelines for following up such lesions, I would suggest an initial six-month follow up by ultrasound. 2. Levoconvex lumbar scoliosis. 3. Several incidental hepatic cysts. Electronically Signed   By: Van Clines [name]   On: 06/23/2017 08:30    Assessment & Plan:   There are no diagnoses linked to this encounter. I have discontinued Trinna Balloon. [place] "[name]"'s fluconazole. I am also having him maintain his Immune Globulin (Human), Coenzyme Q10 (CO Q 10 PO), OMEGA-3 KRILL OIL PO, sucralfate, AXIRON, lidocaine-prilocaine, aspirin EC, LUTEIN PO, FLUTTER, AEROCHAMBER MV, budesonide-formoterol, levalbuterol, levothyroxine, timolol, telmisartan, hydrochlorothiazide, atorvastatin, latanoprost, LORazepam, zolpidem, and LamoTRIgine.  No orders of the defined types were placed in this encounter.    Follow-up: No Follow-up on file.  Walker Kehr, MD

## 2017-08-01 NOTE — Assessment & Plan Note (Signed)
MSK Meloxicam prn

## 2017-08-01 NOTE — Assessment & Plan Note (Signed)
?  MSK R testis is atrophic and sensitive to palp; no mass L groin is sensitive and a little fuller compare to the L Scrotum US was suggested  Wille Glaser will let me know in 1 week

## 2017-08-01 NOTE — Assessment & Plan Note (Signed)
Dr Arfeen Lorazepam prn  Potential benefits of a long term benzodiazepines  use as well as potential risks  and complications were explained to the patient and were aknowledged. 

## 2017-08-08 ENCOUNTER — Telehealth: Payer: Self-pay | Admitting: Internal Medicine

## 2017-08-08 NOTE — Telephone Encounter (Signed)
Patient requesting written script for generic Cialis.

## 2017-08-08 NOTE — Telephone Encounter (Signed)
RX was d/c in May due to pt not taking, please advise about refill

## 2017-08-10 MED ORDER — TADALAFIL 20 MG PO TABS: 20.0000 mg | ORAL_TABLET | Freq: Every day | ORAL | 3 refills | Status: DC | PRN

## 2017-08-10 NOTE — Telephone Encounter (Signed)
Done. Thx.

## 2017-08-11 ENCOUNTER — Other Ambulatory Visit (HOSPITAL_COMMUNITY): Payer: Self-pay

## 2017-08-11 DIAGNOSIS — F3162 Bipolar disorder, current episode mixed, moderate: Secondary | ICD-10-CM

## 2017-08-11 MED ORDER — ZOLPIDEM TARTRATE 10 MG PO TABS: 10.0000 mg | ORAL_TABLET | Freq: Every evening | ORAL | 2 refills | Status: DC | PRN

## 2017-08-19 ENCOUNTER — Other Ambulatory Visit: Payer: Self-pay | Admitting: Internal Medicine

## 2017-08-19 ENCOUNTER — Encounter (HOSPITAL_COMMUNITY): Payer: Self-pay | Admitting: Licensed Clinical Social Worker

## 2017-08-19 ENCOUNTER — Ambulatory Visit (HOSPITAL_COMMUNITY): Payer: 59 | Admitting: Licensed Clinical Social Worker

## 2017-08-19 DIAGNOSIS — F319 Bipolar disorder, unspecified: Secondary | ICD-10-CM | POA: Diagnosis not present

## 2017-08-19 NOTE — Progress Notes (Signed)
Therapist Progress Note   Time: 3:10-4 pm  Participation Level: Active  Behavioral Response: Casual, Alert, Anxious  Type of Therapy: Individual Therapy  Treatment Goals Addressed: Coping  Interventions: Supportive/ CBT  Summary: Pt presented for individual therapy. Pt discussed his psychiatric symptoms and current life events.  Pt discussed his Christmas holiday surrounded by his family. His son is out of [facility] but still continues with his chronic self mutilation. He ended up at the ED on Christmas eve. Pt continues to talk about his financial situation and not being heard by his wife.These are his same stressors. Problem solve with pt at each session but pt follows through minimally,  Suggested to pt to go to a financial planner so that he and wife can discuss their finances together.  Pt still feels hopeless and helpless about his son. Again, discussed options for his son's mental illness.    Suicidal/Homicidal: No  Therapist Response: Assessed pt's current functioning and reviewed progress. Assisted pt processing issues with son, feelings of hopelessness, supports,  and processing for management of stressors.  Plan: Return in 2 weeks using CBT  Diagnosis: Axis 1: Bipolar 1 disorder,  [name], LCAS 08/19/17

## 2017-09-01 ENCOUNTER — Ambulatory Visit (HOSPITAL_COMMUNITY): Payer: Self-pay | Admitting: Licensed Clinical Social Worker

## 2017-09-15 ENCOUNTER — Ambulatory Visit (HOSPITAL_COMMUNITY): Payer: 59 | Admitting: Licensed Clinical Social Worker

## 2017-09-15 ENCOUNTER — Encounter (HOSPITAL_COMMUNITY): Payer: Self-pay | Admitting: Licensed Clinical Social Worker

## 2017-09-15 DIAGNOSIS — F319 Bipolar disorder, unspecified: Secondary | ICD-10-CM

## 2017-09-15 NOTE — Progress Notes (Signed)
Therapist Progress Note   Time: 3:10-4 pm  Participation Level: Active  Behavioral Response: Casual, Alert, Anxious  Type of Therapy: Individual Therapy  Treatment Goals Addressed: Coping  Interventions: Supportive/ CBT  Summary: Pt presented for individual therapy. Pt discussed his psychiatric symptoms and current life events. Pt discussed his psychiatric symptoms and reports his mood is stable. Pt still struggles with communication with his wife. He does not feel heard. Used empathic reflection to intrust a mutual understanding of the pt's feelings. Assisted pt in building hope and strengthening resiliency.      Suicidal/Homicidal: No  Therapist Response: Assessed pt's current functioning and reviewed progress. Assisted pt processing moods, communication, feeling heard, empathic reflection, building hope and resilience and processing for management of stressors.  Plan: Return in 2 weeks using CBT  Diagnosis: Axis 1: Bipolar 1 disorder,   [name], LCAS 09/16/17

## 2017-09-16 DIAGNOSIS — J029 Acute pharyngitis, unspecified: Secondary | ICD-10-CM | POA: Insufficient documentation

## 2017-09-16 DIAGNOSIS — R220 Localized swelling, mass and lump, head: Secondary | ICD-10-CM | POA: Insufficient documentation

## 2017-09-17 ENCOUNTER — Other Ambulatory Visit: Payer: Self-pay | Admitting: Internal Medicine

## 2017-09-29 ENCOUNTER — Encounter (HOSPITAL_COMMUNITY): Payer: Self-pay | Admitting: Psychiatry

## 2017-09-29 ENCOUNTER — Ambulatory Visit (HOSPITAL_COMMUNITY): Payer: 59 | Admitting: Psychiatry

## 2017-09-29 DIAGNOSIS — Z818 Family history of other mental and behavioral disorders: Secondary | ICD-10-CM | POA: Diagnosis not present

## 2017-09-29 DIAGNOSIS — F1099 Alcohol use, unspecified with unspecified alcohol-induced disorder: Secondary | ICD-10-CM

## 2017-09-29 DIAGNOSIS — Z813 Family history of other psychoactive substance abuse and dependence: Secondary | ICD-10-CM | POA: Diagnosis not present

## 2017-09-29 DIAGNOSIS — F411 Generalized anxiety disorder: Secondary | ICD-10-CM

## 2017-09-29 DIAGNOSIS — F3162 Bipolar disorder, current episode mixed, moderate: Secondary | ICD-10-CM | POA: Diagnosis not present

## 2017-09-29 DIAGNOSIS — F319 Bipolar disorder, unspecified: Secondary | ICD-10-CM

## 2017-09-29 MED ORDER — LORAZEPAM 0.5 MG PO TABS: ORAL_TABLET | ORAL | 2 refills | Status: DC

## 2017-09-29 MED ORDER — LAMOTRIGINE ER 300 MG PO TB24: 1.0000 | ORAL_TABLET | Freq: Every day | ORAL | 0 refills | Status: DC

## 2017-09-29 MED ORDER — ZOLPIDEM TARTRATE 10 MG PO TABS
10.0000 mg | ORAL_TABLET | Freq: Every evening | ORAL | 2 refills | Status: DC | PRN
Start: 2017-09-29 — End: 2017-12-22

## 2017-09-29 NOTE — Progress Notes (Signed)
BH MD/PA/NP OP Progress Note  09/29/2017 2:23 PM Alex [name]  MRN:  [id number]  Chief Complaint: I am doing good on medication.  I am concerned about my son who recently released from [facility] hospital.  HPI: Patient came for his follow-up appointment.  He is taking his medication as prescribed.  He denies any major panic attack but continued to get anxiety and nervousness from his family members.  Recently his son admitted at [facility] hospital.  His son has chronic mental issues and has been hospitalized multiple times.  Not patient believe his son may have to file bankruptcy because he does not have enough financial resources.  He is happy that all his kids came to Christmas and he had a good time.  His daughter came from [place] with 3 children.  His anniversary is coming and is hoping to spend few days in a resort near [place] with his wife.  His wife is working at [employer]  Patient recently seen his primary care physician.  He has blood work.  He is taking Lamictal, Ativan and Ambien 5-10 mg at bedtime.  He has no rash, itching, tremors or shakes.  He denies any crying spells or any feeling of hopelessness or worthlessness.  He denies any suicidal thoughts or homicidal thoughts.  He denies drinking alcohol or using any illegal substances.   Visit Diagnosis:    ICD-10-CM   1. Bipolar I disorder (HCC) F31.9 LamoTRIgine 300 MG TB24 24 hour tablet  2. Bipolar 1 disorder (HCC) F31.9 LORazepam (ATIVAN) 0.5 MG tablet  3. Bipolar 1 disorder, mixed, moderate (HCC) F31.62 zolpidem (AMBIEN) 10 MG tablet    Past Psychiatric History: Updated. Patient has been seeing in this office since 2009. He has history of depression mood swing and anger. He's been admitted to behavioral [facility] due to suicidal thoughts but he denies any suicidal attempt. He denies any psychosis but admitted poor impulse control. In the past he has taken Seroquel and Cymbalta  Past Medical History:   Past Medical History:  Diagnosis Date  . Anxiety   . At risk for sleep apnea    STOP-BANG= 5    SENT TO PCP 04-22-2014  . Bipolar I disorder ([place])   . Bleeding ulcer 06/2014  . BPH (benign prostatic hypertrophy) with urinary obstruction   . Chronic back pain   . Complication of anesthesia POST URINARY RETENTION---  2006 SHOULDER SURGERY MARKED BRADYCARDIA VAGAL RESPONSE NO ISSUE W/ SURGERY AFTER THIS ONE  . Coronary atherosclerosis CARDIOLOGIST- DR CRENSHAW--  LAST VISIT 01-05-2012 IN EPIC   NON-OBSTRUCTIVE MILD DISEASE  . 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 Depression   . Epicondylitis    right elbow  . GERD (gastroesophageal reflux disease)   . Glaucoma BOTH EYES -- NO DROPS   . History of chronic prostatitis   . 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 History of thyroid cancer PRIMARY (NO METS FROM ORBITAL CANCER)--   IN REMISSION   S/P TOTAL THYROIDECTOMY  , CHEMORADIATION  (ONCOLOGIST -- DR Griffith Citron)  . Hyperlipidemia   . Hypertension   . Immune deficiency disorder ([place])    COMMON VARIABLE  . Nocturia   . OA (osteoarthritis)   . Positional vertigo   .  Renal calculi LEFT KIDNEY-- NON-OBSTRUCTIVE  . Ulnar nerve compression    right elbow  . Urinary hesitancy     Past Surgical History:  Procedure Laterality Date  . CARDIAC CATHETERIZATION  01-16-2006  DR Marcello Moores WALL   MILD CORONARY ATHEROSCLEROSIS/ MID TO DISTAL LAD 40% STENOSIS/ LVF 50-55%  . KNEE ARTHROSCOPY  05/01/2012   Procedure: ARTHROSCOPY KNEE;  Surgeon: Johnn Hai, MD;  Location: [facility];  Service: Orthopedics;  Laterality: Left;  debridement and removal of loose body  . LEFT ANKLE ARTHROSCOPY W/ DEBRIDEMENT  05-12-2007  . LEFT HYDROCELECTOMY  03-29-2005   AND REPAIR LEFT INGUINAL HERNIA W/ MESH  . MOHS SURGERY   2002   RIGHT ORBITAL CANCER  . NASAL ENDOSCOPY  08-07-2005   RIGHT EPISTAXIS  / POST SEPTOPLASTY  (HX RIGHT ORBITAL CA & S/P RADIATION/ NECROSIS ANTERIOR END OF BOTH INFERIOR TURBINATES)  . occuloplastic surgery  2002  . PARS PLANA VITRECTOMY  11-06-2004   RIGHT EYE RADIATION RETINOPATHY W/ HEMORRHAGE  . REPAIR UNDESENDED RIGHT TESTICLE / RIGHT INGUINAL HERNIA  AGE 2  . RIGHT ANKLE ARTHROSCOPY W/ EXTENSIVE DEBRIDEMENT  04-05-2008   . RIGHT SUPRAOMOHYOID NECK DISSECTION   03-08-2003   ZONES 1,2,3;   SUBMANDIBULAR MASS / METASTATIC SQUAMOUS CELL CARCINOMA RIGHT NECK  . SEPTOPLASTY  NOV 2006  . SHOULDER ARTHROSCOPY W/ SUBACROMIAL DECOMPRESSION AND DISTAL CLAVICLE EXCISION  10-09-2008   AND DEBRIDEMENT OF RIGHT SHOULDER IMPINGEMENT & [facility] JOINT ARTHRITIS  . SPINE SURGERY    . TOTAL THYROIDECTOMY  11-03-2001   PAPILLARY THYROID CARCINOMA  . TRANSTHORACIC ECHOCARDIOGRAM  12/ 2012   grade I diastolic dysfunction/ ef 53-97%  . ULNAR NERVE TRANSPOSITION Right 04/28/2014   Procedure: RIGHT ELBOW ULNA NERVE RELEASE TRANSPOSTION AND MEDIAL EPICONDYLAR DEBRIDEMENT AND REPAIR;  Surgeon: Linna Hoff, MD;  Location: [facility];  Service: Orthopedics;  Laterality: Right;    Family Psychiatric History: Updated.  Family History:  Family History  Problem Relation Age of Onset  . Depression Sister   . Rectal cancer Sister   . Lung cancer Brother   . Kidney disease Mother   . Depression Mother   . Stroke Father   . COPD Father   . Hypertension Father   . Prostate cancer Father   . Depression Daughter   . Drug abuse Daughter   . Psychiatric Illness Son     Social History:  Social History   Socioeconomic History  . Marital status: Married    Spouse name: Not on file  . Number of children: Not on file  . Years of education: Not on file  . Highest education level: Not on file  Social Needs  . Financial resource strain: Not on file  . Food insecurity - worry: Not on file  .  Food insecurity - inability: Not on file  . Transportation needs - medical: Not on file  . Transportation needs - non-medical: Not on file  Occupational History  . Not on file  Tobacco Use  . Smoking status: Never Smoker  . Smokeless tobacco: Never Used  Substance and Sexual Activity  . Alcohol use: Yes    Alcohol/week: 0.0 oz    Comment: 1-2 glasses per month  . Drug use: No  . Sexual activity: Yes    Partners: Female    Birth control/protection: None  Other Topics Concern  . Not on file  Social History Narrative   Originally from [place]. Moved to [place] in 1984. Previously  worked in Designer, jewellery as a Scientist, research (physical sciences), Social research officer, government. for 22 years. 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 No international travel other than [place]. Has a dog at home, poodle mix. His daughter who lives with him now has a dog. No mold exposure. No bird exposure. No hot tub exposure. Enjoys gardening.     Allergies:  Allergies  Allergen Reactions  . Percocet [Oxycodone-Acetaminophen] Itching    Can take generic.  States only has a problem with percocet brand    Metabolic Disorder Labs: Lab Results  Component Value Date   HGBA1C 6.1 08/04/2012   MPG 123 (H) 01/05/2012   No results found for: PROLACTIN Lab Results  Component Value Date   CHOL 151 07/23/2016   TRIG 175 (H) 07/23/2016   HDL 74 07/23/2016   CHOLHDL 2.0 07/23/2016   VLDL 35 (H) 07/23/2016   LDLCALC 42 07/23/2016   LDLCALC 86 08/01/2015   Lab Results  Component Value Date   TSH 3.10 01/22/2017   TSH 3.50 07/23/2016    Therapeutic Level Labs: No results found for: LITHIUM No results found for: VALPROATE No components found for:  CBMZ  Current Medications: Current Outpatient Medications  Medication Sig Dispense Refill  . aspirin EC 81 MG tablet Take 1 tablet by mouth daily.    [name] atorvastatin (LIPITOR) 40 MG tablet Take 1 tablet (40 mg total) by mouth daily. 90 tablet 1  .  AXIRON 30 MG/ACT SOLN Place 2 Act onto the skin daily.    . budesonide-formoterol (SYMBICORT) 80-4.5 MCG/ACT inhaler Inhale 2 puffs into the lungs 2 (two) times daily. (Patient taking differently: Inhale 2 puffs 2 (two) times daily into the lungs. ) 1 Inhaler 0  . Coenzyme Q10 (CO Q 10 PO) Take by mouth daily.    . hydrochlorothiazide (MICROZIDE) 12.5 MG capsule Take 1 capsule (12.5 mg total) by mouth daily. 90 capsule 3  . Immune Globulin, Human, (HIZENTRA) 4 GM/20ML SOLN Inject into the skin once a week. wednesday    . LamoTRIgine 300 MG TB24 24 hour tablet Take 1 tablet (300 mg total) daily by mouth. 90 tablet 0  . latanoprost (XALATAN) 0.005 % ophthalmic solution USE 1 DROP INTO BOTH EYES ECH NIGHT  11  . levalbuterol (XOPENEX HFA) 45 MCG/ACT inhaler Inhale 2 puffs into the lungs every 6 (six) hours as needed for wheezing. 1 Inhaler 5  . levothyroxine (SYNTHROID, LEVOTHROID) 150 MCG tablet TAKE 1 TABLET BY MOUTH EVERY DAY 90 tablet 3  . lidocaine-prilocaine (EMLA) cream as needed.    [name] LORazepam (ATIVAN) 0.5 MG tablet Take 1 tab daily as needed and 1 tab at bed time 60 tablet 2  . LUTEIN PO Take by mouth daily.    . meloxicam (MOBIC) 15 MG tablet Take 1 tablet (15 mg total) by mouth daily. 30 tablet 2  . OMEGA-3 KRILL OIL PO Take by mouth daily.    [name] Respiratory Therapy Supplies (FLUTTER) DEVI Use as directed 1 each 0  . Spacer/Aero-Holding Chambers (AEROCHAMBER MV) inhaler Use as instructed 1 each 0  . sucralfate (CARAFATE) 1 G tablet Take 1 g by mouth as needed.   3  . tadalafil (CIALIS) 20 MG tablet Take 1 tablet (20 mg total) by mouth daily as needed for erectile dysfunction. 10 tablet 3  . telmisartan (MICARDIS) 80 MG tablet TAKE 1 TABLET (80 MG TOTAL) BY MOUTH DAILY. 90 tablet 3  . timolol (TIMOPTIC-XR) 0.25 %  ophthalmic gel-forming INSTILL 1 DROP EVERY DAY INTO BOTH EYES  5  . zolpidem (AMBIEN) 10 MG tablet Take 1 tablet (10 mg total) by mouth at bedtime as needed. for sleep 30 tablet  2   No current facility-administered medications for this visit.      Musculoskeletal: Strength & Muscle Tone: within normal limits Gait & Station: normal Patient leans: N/A  Psychiatric Specialty Exam: ROS  Blood pressure (!) 152/90, pulse 81, height 6' (1.829 m), weight 181 lb 6.4 oz (82.3 kg).There is no height or weight on file to calculate BMI.  General Appearance: Casual  Eye Contact:  Good  Speech:  Clear and Coherent  Volume:  Normal  Mood:  Euthymic  Affect:  Congruent  Thought Process:  Goal Directed  Orientation:  Full (Time, Place, and Person)  Thought Content: Logical   Suicidal Thoughts:  No  Homicidal Thoughts:  No  Memory:  Immediate;   Good Recent;   Good Remote;   Good  Judgement:  Good  Insight:  Good  Psychomotor Activity:  Normal  Concentration:  Concentration: Fair and Attention Span: Fair  Recall:  Good  Fund of Knowledge: Good  Language: Good  Akathisia:  No  Handed:  Right  AIMS (if indicated): not done  Assets:  Communication Skills Desire for Improvement Financial Resources/Insurance Housing Resilience Social Support  ADL's:  Intact  Cognition: WNL  Sleep:  Fair   Screenings: GAD-7     Office Visit from 08/01/2017 in [facility]  Total GAD-7 Score  6    PHQ2-9     Office Visit from 08/01/2017 in [facility]  PHQ-2 Total Score  2  PHQ-9 Total Score  4       Assessment and Plan: Bipolar disorder type I.  Generalized anxiety disorder.  Patient doing better on his current medication despite family issues.  Continue Lamictal 300 mg daily, lorazepam 0.5 mg twice a day and Ambien 5-10 mg as needed for insomnia.  Discussed medication side effects and benefits.  Discussed benzodiazepine dependence tolerance withdrawal and abuse.  Recommended to call us back if is any question or any concern.  Patient is not interested in counseling.  Follow-up in 3 months.   Kathlee Nations,  MD 09/29/2017, 2:23 PM

## 2017-10-13 ENCOUNTER — Ambulatory Visit (INDEPENDENT_AMBULATORY_CARE_PROVIDER_SITE_OTHER): Payer: 59 | Admitting: Licensed Clinical Social Worker

## 2017-10-13 ENCOUNTER — Encounter (HOSPITAL_COMMUNITY): Payer: Self-pay | Admitting: Licensed Clinical Social Worker

## 2017-10-13 DIAGNOSIS — F319 Bipolar disorder, unspecified: Secondary | ICD-10-CM

## 2017-10-13 NOTE — Progress Notes (Signed)
Therapist Progress Note   Time: 2:10-3 pm  Participation Level: Active  Behavioral Response: Casual, Alert, Anxious  Type of Therapy: Individual Therapy  Treatment Goals Addressed: Coping  Interventions: Supportive/ CBT  Summary: Pt presented for individual therapy. Pt discussed his psychiatric symptoms and current life events. Pt discussed his psychiatric symptoms and reports his mood is stable. Pt is distraught about his son as he continues with self injurious behavior. Asked open ended questions about his feelings about this continued situation. Pt does not know what to do about his son and is learning to take it one day at a time. He reports his son is becoming more aggressive at home, which is a new behavior. Pt and his wife went on an anniversary trip to [facility]. It was a good experience for them to get away from life's problems and spend some quality time together. It makes pt feel more hopeful about his marriage. Asked pt open ended questions. Pt is having surgery towards the end of the month on his arm and wrist. Pt feels relieved that he is finally going to take care of the pain he has been experiencing. Pt laments about how difficult his life is and appreciates that he can talk to someone who really listens to him and assists him with suggestions. He reports he finally feels heard.   Suicidal/Homicidal: No  Therapist Response: Assessed pt's current functioning and reviewed progress. Assisted pt processing moods, feeling heard,  Assisted pt  processing for management of stressors.  Plan: Return in 2 weeks using CBT  Diagnosis: Axis 1: Bipolar 1 disorder,   [name], LCAS 10/13/17

## 2017-10-16 ENCOUNTER — Other Ambulatory Visit: Payer: Self-pay

## 2017-10-16 ENCOUNTER — Encounter (HOSPITAL_BASED_OUTPATIENT_CLINIC_OR_DEPARTMENT_OTHER): Payer: Self-pay | Admitting: *Deleted

## 2017-10-16 NOTE — Progress Notes (Signed)
NPO AFTER MIDNIGHT, ARRIVE 1030 AM [facility] MEDS TO TAKE SIP OF WATER: LORAZAPAM PRN, SYMBOCORT PRN/BRING, XOPENEX INHALER PRN/BRING, LAMOTRIGENE, LEVOTHRYOXINE CHEST CT 06-14-17 Epic/CHART, PATIENT FOLLOWED BY DR CRENSHAW FOR TAA HAS YEARLY CHEST CT LOV DR CRENSHAW 02-14-17 Epic/CHART LOV DR Cyndia Bent 08-14-16 Epic Hyman Bible EKG 02-24-17 Epic/CHART [place]  NEEDS ISTAT 4

## 2017-10-21 NOTE — H&P (Signed)
Alex [name] is an 62 y.o. male.   Chief Complaint: Right radial head nonunion malunion and Right carpal tunnel syndrome  HPI: Alex [name] is a 62 y/o right hand dominant male who sustained an injury to the right upper extremity. He has had continued mechanical pain in the elbow that is exacerbated with use of the extremity. He also has developed numbness in the right hand.  He has had a nerve conduction study that showed moderate carpal tunnel syndrome.  Discussed the reason and rationale for surgery of the elbow as well as for the carpal tunnel. Discussed the surgical procedure, including the risks versus benefits, and the post-operative recovery.  The patient is here today for surgery.  He denies fever, chills, N/V/D, chest pain or shortness of breath.  Past Medical History:  Diagnosis Date  . Anemia   . Anxiety   . Bipolar I disorder ([place])   . Bleeding ulcer 06/2014  . Bleeding ulcer 2016  . BPH (benign prostatic hypertrophy) with urinary obstruction   . Cataract    LEFT EYE  . Chronic back pain   . Complication of anesthesia POST URINARY RETENTION---  2006 SHOULDER SURGERY MARKED BRADYCARDIA VAGAL RESPONSE NO ISSUE W/ SURGERY AFTER THIS ONE   WITH GENERAL ANESTHESIA, 15 YRS AGO VASOVAGAL REACTION NONE SINCE  . Coronary atherosclerosis CARDIOLOGIST- DR CRENSHAW--  LAST VISIT 01-05-2012 IN EPIC   NON-OBSTRUCTIVE MILD DISEASE  . 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)   . Depression   . Epicondylitis    right elbow  . GERD (gastroesophageal reflux disease)   . Glaucoma BOTH EYES   RIGHT EYE RADIATION DAMAGE  . Hearing loss    BOTH EARS  . History of chronic prostatitis   . History of hiatal hernia    SMALL  . History of kidney stones   . 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 History of thyroid cancer  PRIMARY (NO METS FROM ORBITAL CANCER)--   IN REMISSION   S/P TOTAL THYROIDECTOMY  , CHEMORADIATION  (ONCOLOGIST -- DR Griffith Citron)  . Hyperlipidemia   . Hypertension   . Nocturia   . OA (osteoarthritis)   . Peripheral vascular disease (HCC)    THORACIC AA 3. 9 CM X 4. 3 CM PER NOV 06-14-17  CHEST CTFOLOWED BY DR CRENSHAW YEARLY FOR  . Positional vertigo    HX OF WITH SINUS INFECTIONS  . Tinnitus    CONSTANT  . Ulnar nerve compression    right elbow  . Urinary hesitancy     Past Surgical History:  Procedure Laterality Date  . CARDIAC CATHETERIZATION  01-16-2006  DR Marcello Moores WALL   MILD CORONARY ATHEROSCLEROSIS/ MID TO DISTAL LAD 40% STENOSIS/ LVF 50-55%  . COLONSCOPY  2017 LAST DONE   MULTIPLE  . ENDOSCOPY  LAST 2017   MULTIPLE DONE DILATION DONE ALSO  . KNEE ARTHROSCOPY  05/01/2012   Procedure: ARTHROSCOPY KNEE;  Surgeon: Johnn Hai, MD;  Location: [facility];  Service: Orthopedics;  Laterality: Left;  debridement and removal of loose body  . LEFT ANKLE ARTHROSCOPY W/ DEBRIDEMENT  05-12-2007  . LEFT HYDROCELECTOMY  03-29-2005   AND REPAIR LEFT INGUINAL HERNIA W/ MESH  . MOHS SURGERY  2002   RIGHT ORBITAL CANCER  . NASAL  ENDOSCOPY  08-07-2005   RIGHT EPISTAXIS  / POST SEPTOPLASTY  (HX RIGHT ORBITAL CA & S/P RADIATION/ NECROSIS ANTERIOR END OF BOTH INFERIOR TURBINATES)  . occuloplastic surgery  2002  . PARS PLANA VITRECTOMY  11-06-2004   RIGHT EYE RADIATION RETINOPATHY W/ HEMORRHAGE  . REPAIR UNDESENDED RIGHT TESTICLE / RIGHT INGUINAL HERNIA  AGE 71  . RIGHT ANKLE ARTHROSCOPY W/ EXTENSIVE DEBRIDEMENT  04-05-2008   . RIGHT SHOULDER SURGERY  2006  . RIGHT SUPRAOMOHYOID NECK DISSECTION   03-08-2003   ZONES 1,2,3;   SUBMANDIBULAR MASS / METASTATIC SQUAMOUS CELL CARCINOMA RIGHT NECK  . SEPTOPLASTY  NOV 2006  . SHOULDER ARTHROSCOPY W/ SUBACROMIAL DECOMPRESSION AND DISTAL CLAVICLE EXCISION  10-09-2008   AND DEBRIDEMENT OF RIGHT SHOULDER IMPINGEMENT & [facility] JOINT ARTHRITIS   . SPINE SURGERY  2016   l 3 TO l 4 PLATE AND SCREWS  . TOTAL THYROIDECTOMY  11-03-2001   PAPILLARY THYROID CARCINOMA  . TRANSTHORACIC ECHOCARDIOGRAM  12/ 2012   grade I diastolic dysfunction/ ef 40-08%  . ULNAR NERVE TRANSPOSITION Right 04/28/2014   Procedure: RIGHT ELBOW ULNA NERVE RELEASE TRANSPOSTION AND MEDIAL EPICONDYLAR DEBRIDEMENT AND REPAIR;  Surgeon: Linna Hoff, MD;  Location: [facility];  Service: Orthopedics;  Laterality: Right;    Family History  Problem Relation Age of Onset  . Depression Sister   . Rectal cancer Sister   . Lung cancer Brother   . Kidney disease Mother   . Depression Mother   . Stroke Father   . COPD Father   . Hypertension Father   . Prostate cancer Father   . Depression Daughter   . Drug abuse Daughter   . Psychiatric Illness Son    Social History:  reports that  has never smoked. he has never used smokeless tobacco. He reports that he drinks alcohol. He reports that he does not use drugs.  Allergies:  Allergies  Allergen Reactions  . Percocet [Oxycodone-Acetaminophen] Itching    Can take generic.  States only has a problem with percocet brand, also headache  . Vicodin [Hydrocodone-Acetaminophen]     Itching and sleep disruption, can take generic    No medications prior to admission.    No results found for this or any previous visit (from the past 48 hour(s)). No results found.  Review of Systems  HENT: Positive for congestion and sinus pain.    NO RECENT ILLNESSES OR HOSPITALIZATIONS  Height 6' (1.829 m), weight 82.1 kg (181 lb). Physical Exam   General Appearance:  Alert, cooperative, no distress, appears stated age  Head:  Normocephalic, without obvious abnormality, atraumatic  Eyes:  Pupils equal, conjunctiva/corneas clear,         Throat: Lips, mucosa, and tongue normal; teeth and gums normal  Neck: No visible masses     Lungs:   respirations unlabored  Chest Wall:  No tenderness or deformity  Heart:   Regular rate and rhythm,  Abdomen:   Soft, non-tender,         Extremities: RUE: skin intact, fingers warm well perfused Good cap refil, no open wounds  Pulses: 2+ and symmetric  Skin: Skin color, texture, turgor normal, no rashes or lesions     Neurologic: Normal    Assessment Right radial head malunion nonunion  Right carpal tunnel syndrome  Plan Right radial head resection and joint debridement Right carpal tunnel release  R/B/A DISCUSSED WITH PT IN OFFICE.  PT VOICED UNDERSTANDING OF PLAN CONSENT SIGNED DAY OF SURGERY PT SEEN  AND EXAMINED PRIOR TO OPERATIVE PROCEDURE/DAY OF SURGERY SITE MARKED. QUESTIONS ANSWERED WILL GO HOME FOLLOWING SURGERY  WE ARE PLANNING SURGERY FOR YOUR UPPER EXTREMITY. THE RISKS AND BENEFITS OF SURGERY INCLUDE BUT NOT LIMITED TO BLEEDING INFECTION, DAMAGE TO NEARBY NERVES ARTERIES TENDONS, FAILURE OF SURGERY TO ACCOMPLISH ITS INTENDED GOALS, PERSISTENT SYMPTOMS AND NEED FOR FURTHER SURGICAL INTERVENTION. WITH THIS IN MIND WE WILL PROCEED. I HAVE DISCUSSED WITH THE PATIENT THE PRE AND POSTOPERATIVE REGIMEN AND THE DOS AND DON'TS. PT VOICED UNDERSTANDING AND INFORMED CONSENT SIGNED.     Brynda Peon 10/21/2017, 11:05 AM

## 2017-10-24 ENCOUNTER — Telehealth: Payer: Self-pay | Admitting: *Deleted

## 2017-10-24 DIAGNOSIS — S52121A Displaced fracture of head of right radius, initial encounter for closed fracture: Secondary | ICD-10-CM | POA: Insufficient documentation

## 2017-10-24 NOTE — Telephone Encounter (Signed)
   [facility] Pre-operative Risk Assessment    Request for surgical clearance:  1. What type of surgery is being performed?  RIGHT HAND CARPAL TUNNEL RELEASE/RIGHT PROXIMAL RADIUS RADIAL HEAD RESECTION AND JOINT DEBRIDEMENT  2. When is this surgery scheduled? 11/03/17   3. What type of clearance is required (medical clearance vs. Pharmacy clearance to hold med vs. Both)? MEDICAL   4. Are there any medications that need to be held prior to surgery and how long?   5. Practice name and name of physician performing surgery? [facility]   6. What is your office phone and fax number? PHONE [phone] FAX [phone] ATTN: Santiago Bur    7. Anesthesia type (None, local, MAC, general) ? UNKNOWN

## 2017-10-27 ENCOUNTER — Encounter (HOSPITAL_COMMUNITY): Payer: Self-pay | Admitting: Licensed Clinical Social Worker

## 2017-10-27 ENCOUNTER — Encounter (HOSPITAL_BASED_OUTPATIENT_CLINIC_OR_DEPARTMENT_OTHER): Payer: Self-pay | Admitting: *Deleted

## 2017-10-27 ENCOUNTER — Ambulatory Visit (HOSPITAL_COMMUNITY): Payer: 59 | Admitting: Licensed Clinical Social Worker

## 2017-10-27 DIAGNOSIS — F319 Bipolar disorder, unspecified: Secondary | ICD-10-CM

## 2017-10-27 NOTE — Progress Notes (Signed)
Therapist Progress Note   Time: 3:10-4 pm  Participation Level: Active  Behavioral Response: Casual, Alert, Anxious  Type of Therapy: Individual Therapy  Treatment Goals Addressed: Coping  Interventions: Supportive/ CBT  Summary: Pt presented for individual therapy. Pt discussed his psychiatric symptoms and current life events. Pt discussed his psychiatric symptoms and reports his mood is stable. Pt reports he is having surgery next Monday and his wife is not happy with his decision. She is not supportive nor is a caretaker. Asked open ended questions and empathic reflection. Suggested to pt he will need to ask other family members to assist with care after the surgery. Pt stresses continuously about his future, which he has no control over. Explained process, purpose and practice of mindfulness techniques.  Pt was receptive to the mindfulness practice. Suggested to pt to practice these techniques to assist in focusing on the present.         Suicidal/Homicidal: No  Therapist Response: Assessed pt's current functioning and reviewed progress. Assisted pt processing moods, future, surgery, mindfulness practice. Assisted pt  processing for management of stressors.  Plan: Return in 2 weeks using CBT  Diagnosis: Axis 1: Bipolar 1 disorder,   [name], LCAS 10/27/17

## 2017-10-28 ENCOUNTER — Encounter: Payer: Self-pay | Admitting: *Deleted

## 2017-10-28 ENCOUNTER — Other Ambulatory Visit: Payer: Self-pay | Admitting: Internal Medicine

## 2017-10-28 ENCOUNTER — Other Ambulatory Visit: Payer: Self-pay | Admitting: Cardiology

## 2017-10-28 NOTE — Telephone Encounter (Addendum)
   Primary Cardiologist: [name], MD  Chart reviewed as part of pre-operative protocol coverage. Patient was contacted 10/28/2017 in reference to pre-operative risk assessment for pending surgery as outlined below.  Alex [name] was last seen on 02/24/2017 by Dr Stanford Breed.  Since that day, Alex [name] has done well.  He does not feel limited in his activity by chest pain or shortness of breath, his blood pressure and cholesterol are at goal.  I have sent Dr. Stanford Breed text message to make sure that his 4.1 cm thoracic ascending aortic aneurysm does not put any constraints on the surgery.   Dr. Stanford Breed agrees that the risk is acceptable, based on ACC/AHA guidelines, the patient would be at acceptable risk for the planned procedure without further cardiovascular testing.   I will route this recommendation to the requesting party via Epic fax function and remove from pre-op pool.  Please call with questions.  Rosaria Ferries, PA-C 10/28/2017, 3:40 PM

## 2017-10-28 NOTE — Telephone Encounter (Signed)
Faxed via Epic

## 2017-10-31 DIAGNOSIS — H02215 Cicatricial lagophthalmos left lower eyelid: Secondary | ICD-10-CM | POA: Insufficient documentation

## 2017-11-03 ENCOUNTER — Encounter (HOSPITAL_BASED_OUTPATIENT_CLINIC_OR_DEPARTMENT_OTHER)
Admission: RE | Disposition: A | Discharge status: Home / Self Care | Payer: Self-pay | Source: Ambulatory Visit | Attending: Orthopedic Surgery

## 2017-11-03 ENCOUNTER — Encounter (HOSPITAL_BASED_OUTPATIENT_CLINIC_OR_DEPARTMENT_OTHER): Payer: Self-pay

## 2017-11-03 ENCOUNTER — Ambulatory Visit (HOSPITAL_BASED_OUTPATIENT_CLINIC_OR_DEPARTMENT_OTHER)
Admission: RE | Admit: 2017-11-03 | Discharge: 2017-11-03 | Disposition: A | Discharge status: Home / Self Care | Payer: Managed Care, Other (non HMO) | Source: Ambulatory Visit | Attending: Orthopedic Surgery | Admitting: Orthopedic Surgery

## 2017-11-03 ENCOUNTER — Ambulatory Visit (HOSPITAL_BASED_OUTPATIENT_CLINIC_OR_DEPARTMENT_OTHER): Payer: Managed Care, Other (non HMO) | Admitting: Anesthesiology

## 2017-11-03 DIAGNOSIS — Y929 Unspecified place or not applicable: Secondary | ICD-10-CM | POA: Diagnosis not present

## 2017-11-03 DIAGNOSIS — F419 Anxiety disorder, unspecified: Secondary | ICD-10-CM | POA: Insufficient documentation

## 2017-11-03 DIAGNOSIS — I10 Essential (primary) hypertension: Secondary | ICD-10-CM | POA: Insufficient documentation

## 2017-11-03 DIAGNOSIS — N401 Enlarged prostate with lower urinary tract symptoms: Secondary | ICD-10-CM | POA: Diagnosis not present

## 2017-11-03 DIAGNOSIS — Z85850 Personal history of malignant neoplasm of thyroid: Secondary | ICD-10-CM | POA: Insufficient documentation

## 2017-11-03 DIAGNOSIS — G5601 Carpal tunnel syndrome, right upper limb: Secondary | ICD-10-CM | POA: Insufficient documentation

## 2017-11-03 DIAGNOSIS — Z885 Allergy status to narcotic agent status: Secondary | ICD-10-CM | POA: Diagnosis not present

## 2017-11-03 DIAGNOSIS — S52124A Nondisplaced fracture of head of right radius, initial encounter for closed fracture: Secondary | ICD-10-CM | POA: Insufficient documentation

## 2017-11-03 DIAGNOSIS — Z79899 Other long term (current) drug therapy: Secondary | ICD-10-CM | POA: Diagnosis not present

## 2017-11-03 DIAGNOSIS — I2510 Atherosclerotic heart disease of native coronary artery without angina pectoris: Secondary | ICD-10-CM | POA: Diagnosis not present

## 2017-11-03 DIAGNOSIS — K219 Gastro-esophageal reflux disease without esophagitis: Secondary | ICD-10-CM | POA: Diagnosis not present

## 2017-11-03 DIAGNOSIS — Z87442 Personal history of urinary calculi: Secondary | ICD-10-CM | POA: Diagnosis not present

## 2017-11-03 DIAGNOSIS — E785 Hyperlipidemia, unspecified: Secondary | ICD-10-CM | POA: Diagnosis not present

## 2017-11-03 DIAGNOSIS — F319 Bipolar disorder, unspecified: Secondary | ICD-10-CM | POA: Insufficient documentation

## 2017-11-03 DIAGNOSIS — I739 Peripheral vascular disease, unspecified: Secondary | ICD-10-CM | POA: Diagnosis not present

## 2017-11-03 DIAGNOSIS — N138 Other obstructive and reflux uropathy: Secondary | ICD-10-CM | POA: Insufficient documentation

## 2017-11-03 DIAGNOSIS — X58XXXA Exposure to other specified factors, initial encounter: Secondary | ICD-10-CM | POA: Insufficient documentation

## 2017-11-03 HISTORY — PX: CARPAL TUNNEL RELEASE: SHX101

## 2017-11-03 HISTORY — DX: Personal history of urinary calculi: Z87.442

## 2017-11-03 HISTORY — DX: Unspecified cataract: H26.9

## 2017-11-03 HISTORY — PX: EXCISION RADIAL HEAD: SHX6611

## 2017-11-03 HISTORY — DX: Tinnitus, unspecified ear: H93.19

## 2017-11-03 HISTORY — DX: Peripheral vascular disease, unspecified: I73.9

## 2017-11-03 HISTORY — DX: Personal history of other diseases of the digestive system: Z87.19

## 2017-11-03 HISTORY — DX: Unspecified hearing loss, unspecified ear: H91.90

## 2017-11-03 HISTORY — DX: Anemia, unspecified: D64.9

## 2017-11-03 LAB — POCT I-STAT 4, (NA,K, GLUC, HGB,HCT)
Glucose, Bld: 97 mg/dL (ref 65–99)
HCT: 53 % — ABNORMAL HIGH (ref 39.0–52.0)
Hemoglobin: 18 g/dL — ABNORMAL HIGH (ref 13.0–17.0)
Potassium: 3.9 mmol/L (ref 3.5–5.1)
Sodium: 143 mmol/L (ref 135–145)

## 2017-11-03 SURGERY — CARPAL TUNNEL RELEASE
Anesthesia: Monitor Anesthesia Care | Laterality: Right

## 2017-11-03 MED ORDER — FENTANYL CITRATE (PF) 100 MCG/2ML IJ SOLN
INTRAMUSCULAR | Status: AC
Start: 2017-11-03 — End: 2017-11-03
  Filled 2017-11-03: qty 2

## 2017-11-03 MED ORDER — EPHEDRINE 5 MG/ML INJ
INTRAVENOUS | Status: AC
  Filled 2017-11-03: qty 10

## 2017-11-03 MED ORDER — EPHEDRINE SULFATE 50 MG/ML IJ SOLN
INTRAMUSCULAR | Status: DC | PRN
  Administered 2017-11-03: 10 mg via INTRAVENOUS

## 2017-11-03 MED ORDER — DEXAMETHASONE SODIUM PHOSPHATE 10 MG/ML IJ SOLN
INTRAMUSCULAR | Status: DC | PRN
  Administered 2017-11-03: 10 mg via INTRAVENOUS

## 2017-11-03 MED ORDER — SODIUM CHLORIDE 0.9 % IR SOLN
Status: DC | PRN
  Administered 2017-11-03: 1000 mL

## 2017-11-03 MED ORDER — MIDAZOLAM HCL 2 MG/2ML IJ SOLN
INTRAMUSCULAR | Status: DC | PRN
  Administered 2017-11-03: 2 mg via INTRAVENOUS

## 2017-11-03 MED ORDER — FENTANYL CITRATE (PF) 100 MCG/2ML IJ SOLN
100.0000 ug | Freq: Once | INTRAMUSCULAR | Status: AC
  Administered 2017-11-03: 100 ug via INTRAVENOUS
  Filled 2017-11-03: qty 2

## 2017-11-03 MED ORDER — CHLORHEXIDINE GLUCONATE 4 % EX LIQD
60.0000 mL | Freq: Once | CUTANEOUS | Status: DC
  Filled 2017-11-03: qty 118

## 2017-11-03 MED ORDER — KETOROLAC TROMETHAMINE 30 MG/ML IJ SOLN
INTRAMUSCULAR | Status: AC
  Filled 2017-11-03: qty 2

## 2017-11-03 MED ORDER — PROPOFOL 500 MG/50ML IV EMUL
INTRAVENOUS | Status: AC
  Filled 2017-11-03: qty 50

## 2017-11-03 MED ORDER — LACTATED RINGERS IV SOLN
INTRAVENOUS | Status: DC
  Administered 2017-11-03: 11:00:00 via INTRAVENOUS
  Filled 2017-11-03: qty 1000

## 2017-11-03 MED ORDER — FENTANYL CITRATE (PF) 100 MCG/2ML IJ SOLN
INTRAMUSCULAR | Status: DC | PRN
  Administered 2017-11-03: 25 ug via INTRAVENOUS

## 2017-11-03 MED ORDER — CEFAZOLIN SODIUM-DEXTROSE 2-4 GM/100ML-% IV SOLN
2.0000 g | INTRAVENOUS | Status: AC
  Administered 2017-11-03: 2 g via INTRAVENOUS
  Filled 2017-11-03: qty 100

## 2017-11-03 MED ORDER — MIDAZOLAM HCL 2 MG/2ML IJ SOLN
2.0000 mg | Freq: Once | INTRAMUSCULAR | Status: AC
Start: 2017-11-03 — End: 2017-11-03
  Administered 2017-11-03: 2 mg via INTRAVENOUS
  Filled 2017-11-03: qty 2

## 2017-11-03 MED ORDER — ONDANSETRON HCL 4 MG/2ML IJ SOLN
INTRAMUSCULAR | Status: AC
  Filled 2017-11-03: qty 4

## 2017-11-03 MED ORDER — PROPOFOL 10 MG/ML IV BOLUS
INTRAVENOUS | Status: AC
  Filled 2017-11-03: qty 20

## 2017-11-03 MED ORDER — DEXAMETHASONE SODIUM PHOSPHATE 10 MG/ML IJ SOLN
INTRAMUSCULAR | Status: AC
  Filled 2017-11-03: qty 2

## 2017-11-03 MED ORDER — MIDAZOLAM HCL 2 MG/2ML IJ SOLN
INTRAMUSCULAR | Status: AC
  Filled 2017-11-03: qty 2

## 2017-11-03 MED ORDER — PROPOFOL 500 MG/50ML IV EMUL
INTRAVENOUS | Status: DC | PRN
  Administered 2017-11-03: 200 ug/kg/min via INTRAVENOUS

## 2017-11-03 MED ORDER — ONDANSETRON HCL 4 MG/2ML IJ SOLN
INTRAMUSCULAR | Status: DC | PRN
  Administered 2017-11-03: 4 mg via INTRAVENOUS

## 2017-11-03 MED ORDER — CEFAZOLIN SODIUM-DEXTROSE 2-4 GM/100ML-% IV SOLN
INTRAVENOUS | Status: AC
  Filled 2017-11-03: qty 100

## 2017-11-03 MED ORDER — BUPIVACAINE HCL (PF) 0.75 % IJ SOLN
INTRAMUSCULAR | Status: DC | PRN
  Administered 2017-11-03: 25 mL

## 2017-11-03 MED ORDER — FENTANYL CITRATE (PF) 100 MCG/2ML IJ SOLN
INTRAMUSCULAR | Status: AC
  Filled 2017-11-03: qty 2

## 2017-11-03 MED ORDER — LIDOCAINE 2% (20 MG/ML) 5 ML SYRINGE
INTRAMUSCULAR | Status: DC | PRN
  Administered 2017-11-03: 50 mg via INTRAVENOUS

## 2017-11-03 MED ORDER — KETAMINE HCL 10 MG/ML IJ SOLN
INTRAMUSCULAR | Status: AC
  Filled 2017-11-03: qty 1

## 2017-11-03 MED ORDER — LIDOCAINE 2% (20 MG/ML) 5 ML SYRINGE
INTRAMUSCULAR | Status: AC
  Filled 2017-11-03: qty 5

## 2017-11-03 SURGICAL SUPPLY — 70 items
BANDAGE ACE 3X5.8 VEL STRL LF (GAUZE/BANDAGES/DRESSINGS) ×2 IMPLANT
BANDAGE ELASTIC 3 VELCRO ST LF (GAUZE/BANDAGES/DRESSINGS) ×2 IMPLANT
BANDAGE ELASTIC 4 VELCRO ST LF (GAUZE/BANDAGES/DRESSINGS) ×4 IMPLANT
BENZOIN TINCTURE PRP APPL 2/3 (GAUZE/BANDAGES/DRESSINGS) IMPLANT
BLADE SURG 15 STRL LF DISP TIS (BLADE) ×1 IMPLANT
BLADE SURG 15 STRL SS (BLADE) ×1
BNDG CONFORM 3 STRL LF (GAUZE/BANDAGES/DRESSINGS) ×2 IMPLANT
BNDG ESMARK 4X9 LF (GAUZE/BANDAGES/DRESSINGS) ×2 IMPLANT
BNDG GAUZE ELAST 4 BULKY (GAUZE/BANDAGES/DRESSINGS) ×2 IMPLANT
CORDS BIPOLAR (ELECTRODE) ×2 IMPLANT
COVER BACK TABLE 60X90IN (DRAPES) ×2 IMPLANT
COVER MAYO STAND STRL (DRAPES) ×2 IMPLANT
CUFF TOURNIQUET SINGLE 18IN (TOURNIQUET CUFF) ×2 IMPLANT
DRAIN PENROSE 18X1/4 LTX STRL (WOUND CARE) IMPLANT
DRAPE EXTREMITY T 121X128X90 (DRAPE) ×2 IMPLANT
DRAPE LG THREE QUARTER DISP (DRAPES) ×4 IMPLANT
DRAPE OEC MINIVIEW 54X84 (DRAPES) ×2 IMPLANT
DRAPE SURG 17X23 STRL (DRAPES) ×2 IMPLANT
DRAPE U-SHAPE 47X51 STRL (DRAPES) IMPLANT
DRSG EMULSION OIL 3X3 NADH (GAUZE/BANDAGES/DRESSINGS) IMPLANT
GAUZE SPONGE 4X4 12PLY STRL (GAUZE/BANDAGES/DRESSINGS) ×2 IMPLANT
GAUZE XEROFORM 1X8 LF (GAUZE/BANDAGES/DRESSINGS) ×2 IMPLANT
GLOVE BIO SURGEON STRL SZ8 (GLOVE) ×2 IMPLANT
GLOVE BIOGEL PI IND STRL 8.5 (GLOVE) ×1 IMPLANT
GLOVE BIOGEL PI INDICATOR 8.5 (GLOVE) ×1
GLOVE SURG ORTHO 8.0 STRL STRW (GLOVE) ×2 IMPLANT
GOWN STRL REUS W/ TWL LRG LVL3 (GOWN DISPOSABLE) ×2 IMPLANT
GOWN STRL REUS W/TWL LRG LVL3 (GOWN DISPOSABLE) ×2
KIT TURNOVER CYSTO (KITS) ×2 IMPLANT
LOOP VESSEL MAXI BLUE (MISCELLANEOUS) IMPLANT
LOOP VESSEL MINI RED (MISCELLANEOUS) IMPLANT
MANIFOLD NEPTUNE II (INSTRUMENTS) IMPLANT
NDL SAFETY ECLIPSE 18X1.5 (NEEDLE) IMPLANT
NEEDLE HYPO 18GX1.5 SHARP (NEEDLE)
NEEDLE HYPO 22GX1.5 SAFETY (NEEDLE) ×4 IMPLANT
NS IRRIG 500ML POUR BTL (IV SOLUTION) ×2 IMPLANT
PACK BASIN DAY SURGERY FS (CUSTOM PROCEDURE TRAY) ×2 IMPLANT
PAD ALCOHOL SWAB (MISCELLANEOUS) ×8 IMPLANT
PAD CAST 3X4 CTTN HI CHSV (CAST SUPPLIES) ×1 IMPLANT
PAD CAST 4YDX4 CTTN HI CHSV (CAST SUPPLIES) ×1 IMPLANT
PADDING CAST ABS 4INX4YD NS (CAST SUPPLIES) ×1
PADDING CAST ABS COTTON 4X4 ST (CAST SUPPLIES) ×1 IMPLANT
PADDING CAST COTTON 3X4 STRL (CAST SUPPLIES) ×1
PADDING CAST COTTON 4X4 STRL (CAST SUPPLIES) ×1
SLING ARM FOAM STRAP LRG (SOFTGOODS) ×2 IMPLANT
SPLINT FIBERGLASS 3X35 (CAST SUPPLIES) IMPLANT
SPLINT FIBERGLASS 4X30 (CAST SUPPLIES) IMPLANT
SPLINT PLASTER CAST XFAST 4X15 (CAST SUPPLIES) ×1 IMPLANT
SPLINT PLASTER XTRA FAST SET 4 (CAST SUPPLIES) ×1
SPONGE GAUZE 4X4 12PLY STER LF (GAUZE/BANDAGES/DRESSINGS) ×2 IMPLANT
STOCKINETTE 4X48 STRL (DRAPES) ×2 IMPLANT
STRIP CLOSURE SKIN 1/2X4 (GAUZE/BANDAGES/DRESSINGS) IMPLANT
SUCTION FRAZIER HANDLE 10FR (MISCELLANEOUS)
SUCTION TUBE FRAZIER 10FR DISP (MISCELLANEOUS) IMPLANT
SUT ETHIBOND 3-0 V-5 (SUTURE) IMPLANT
SUT ETHILON 4 0 PS 2 18 (SUTURE) IMPLANT
SUT MERSILENE 4 0 P 3 (SUTURE) IMPLANT
SUT MNCRL AB 3-0 PS2 18 (SUTURE) ×2 IMPLANT
SUT PROLENE 3 0 PS 1 (SUTURE) ×2 IMPLANT
SUT PROLENE 4 0 PS 2 18 (SUTURE) ×2 IMPLANT
SUT VIC AB 2-0 SH 27 (SUTURE) ×1
SUT VIC AB 2-0 SH 27XBRD (SUTURE) ×1 IMPLANT
SUT VIC AB 4-0 PS2 27 (SUTURE) ×2 IMPLANT
SUT VICRYL 4-0 PS2 18IN ABS (SUTURE) ×2 IMPLANT
SYR BULB 3OZ (MISCELLANEOUS) ×2 IMPLANT
SYR CONTROL 10ML LL (SYRINGE) IMPLANT
TOWEL OR 17X24 6PK STRL BLUE (TOWEL DISPOSABLE) ×6 IMPLANT
TRAY DSU PREP LF (CUSTOM PROCEDURE TRAY) ×2 IMPLANT
TUBE CONNECTING 12X1/4 (SUCTIONS) ×2 IMPLANT
UNDERPAD 30X30 (UNDERPADS AND DIAPERS) ×2 IMPLANT

## 2017-11-03 NOTE — Progress Notes (Signed)
Time out 12:07  with Dr. Ambrose Pancoast and D. Brooks, RN. [phone] Versed 2 mg and Fentanyl 100 mcg IV given as ordered. 1220 ISB compledted tolerated well.

## 2017-11-03 NOTE — Anesthesia Preprocedure Evaluation (Addendum)
Anesthesia Evaluation  Patient identified by MRN, date of birth, ID band Patient awake    Reviewed: Allergy & Precautions, H&P , NPO status , Patient's Chart, lab work & pertinent test results, Unable to perform ROS - Chart review only  History of Anesthesia Complications Negative for: history of anesthetic complications  Airway Mallampati: II  TM Distance: >3 FB Neck ROM: Full    Dental no notable dental hx. (+) Teeth Intact, Dental Advisory Given,    Pulmonary pneumonia, COPD,  COPD inhaler,    Pulmonary exam normal breath sounds clear to auscultation       Cardiovascular hypertension, Pt. on medications + CAD  Normal cardiovascular exam Rhythm:Regular Rate:Normal  ECHO 12/12 Study Conclusions  Left ventricle: The cavity size was normal. Wall thickness was normal. Systolic function was normal. The estimated ejection fraction was in the range of 55% to 60%. Wall motion was normal; there were no regional wall motion abnormalities. Doppler parameters are consistent with abnormal left ventricular relaxation (grade 1 diastolic dysfunction).   Neuro/Psych PSYCHIATRIC DISORDERS Anxiety Depression Bipolar Disorder negative neurological ROS     GI/Hepatic Neg liver ROS, GERD  Medicated,  Endo/Other  negative endocrine ROSHypothyroidism   Renal/GU Renal disease     Musculoskeletal  (+) Arthritis ,   Abdominal   Peds  Hematology negative hematology ROS (+)   Anesthesia Other Findings   Reproductive/Obstetrics                           Anesthesia Physical  Anesthesia Plan  ASA: III  Anesthesia Plan: MAC   Post-op Pain Management: GA combined w/ Regional for post-op pain   Induction: Intravenous  PONV Risk Score and Plan: 1 and Ondansetron and Treatment may vary due to age or medical condition  Airway Management Planned: Natural Airway, Nasal Cannula and Simple Face Mask  Additional  Equipment:   Intra-op Plan:   Post-operative Plan:   Informed Consent: I have reviewed the patients History and Physical, chart, labs and discussed the procedure including the risks, benefits and alternatives for the proposed anesthesia with the patient or authorized representative who has indicated his/her understanding and acceptance.   Dental advisory given  Plan Discussed with: CRNA, Surgeon and Anesthesiologist  Anesthesia Plan Comments: (Discussed both nerve block for pain relief post-op and GA; including NV, sore throat, dental injury, and pulmonary complications)      Anesthesia Quick Evaluation

## 2017-11-03 NOTE — Transfer of Care (Signed)
Immediate Anesthesia Transfer of Care Note  Patient: Alex [name]  Procedure(s) Performed: RIGHT HAND CARPAL TUNNEL RELEASE (Right ) RIGHT PROXIMAL RADIUS RADIAL HEAD RESECTION AND JOINT DEBRIDEMENT (Right )  Patient Location: PACU  Anesthesia Type:MAC  Level of Consciousness: awake, alert , oriented and patient cooperative  Airway & Oxygen Therapy: Patient Spontanous Breathing and Patient connected to nasal cannula oxygen  Post-op Assessment: Report given to RN and Post -op Vital signs reviewed and stable  Post vital signs: Reviewed and stable  Last Vitals:  Vitals Value Taken Time  BP    Temp    Pulse 73 11/03/2017  2:15 PM  Resp    SpO2 100 % 11/03/2017  2:15 PM  Vitals shown include unvalidated device data.  Last Pain:  Vitals:   11/03/17 1047  TempSrc: Oral      Patients Stated Pain Goal: 4 (28/78/67 6720)  Complications: No apparent anesthesia complications

## 2017-11-03 NOTE — Anesthesia Procedure Notes (Signed)
Procedure Name: MAC Date/Time: 11/03/2017 12:45 PM Performed by: Wanita Chamberlain, CRNA Pre-anesthesia Checklist: Patient identified, Emergency Drugs available, Suction available, Patient being monitored and Timeout performed Patient Re-evaluated:Patient Re-evaluated prior to induction Oxygen Delivery Method: Nasal cannula Preoxygenation: Pre-oxygenation with 100% oxygen Induction Type: IV induction Placement Confirmation: positive ETCO2,  CO2 detector and breath sounds checked- equal and bilateral

## 2017-11-03 NOTE — Anesthesia Procedure Notes (Addendum)
Anesthesia Regional Block: Supraclavicular block   Pre-Anesthetic Checklist: ,, timeout performed, Correct Patient, Correct Site, Correct Laterality, Correct Procedure, Correct Position, site marked, Risks and benefits discussed,  Surgical consent,  Pre-op evaluation,  At surgeon's request and post-op pain management  Laterality: Right  Prep: chloraprep       Needles:  Injection technique: Single-shot  Needle Type: Echogenic Stimulator Needle     Needle Length: 5cm  Needle Gauge: 22     Additional Needles:   Procedures:, nerve stimulator,,, ultrasound used (permanent image in chart),,,,  Narrative:  Start time: 11/03/2017 12:15 PM End time: 11/03/2017 12:25 PM Injection made incrementally with aspirations every 5 mL.  Performed by: Personally  Anesthesiologist: Janeece Riggers, MD  Additional Notes: Functioning IV was confirmed and monitors were applied.  A 27mm 22ga Arrow echogenic stimulator needle was used. Sterile prep and drape,hand hygiene and sterile gloves were used. Ultrasound guidance: relevant anatomy identified, needle position confirmed, local anesthetic spread visualized around nerve(s)., vascular puncture avoided.  Image printed for medical record. Negative aspiration and negative test dose prior to incremental administration of local anesthetic. The patient tolerated the procedure well.

## 2017-11-03 NOTE — Op Note (Signed)
PREOPERATIVE DIAGNOSIS:right hand carpal tunnel syndrome Right elbow proximal radius nonunion, malunion  POSTOPERATIVE DIAGNOSIS:same  ATTENDING SURGEON:Dr. Gavin Pound who was scrubbed and present for the entire procedure  ASSISTANT SURGEON:Samantha Tenny Craw who was scrubbed and necessary for the entire procedure who helped aid in release of the nerve radial head resection joint synovectomy closure and splinting in a timely fashion  ANESTHESIA:regional block with IV sedation  OPERATIVE PROCEDURE: #1: Right elbow proximal radial head resection for nonunion #2: Right elbow synovectomy #3: Radiographs 3 views right elbow #4: Right hand carpal tunnel release  IMPLANTS:none  RADIOGRAPHIC INTERPRETATION:aP lateral oblique views of the elbow do show the resection of the radial head with good alignment of the ulnar humeral joint  SURGICAL INDICATIONS:patient is a right-hand-dominant gentleman who sustained a closed injury to his right proximal radius several years ago. Patient had a persistent symptomatic nonunion, malunion. Patient elected undergo the above procedure. Risks of surgery include but not limited to bleeding infection damage to nearby nerves arteries or tendons loss of motion of the wrists and digits incomplete relief of symptoms and need for further surgical intervention. Patient was also having concomitant carpal tunnel syndrome and elected undergo the above procedure.  SURGICAL TECHNIQUE:the patient was properly identified in the preoperative holding area and a mark with a permanent marker made on the right hand and right elbow to indicate correct operative sites. Patient is then brought back to the operating room placed supine on anesthesia and table where the IV sedation was administered after the regional block. Preoperative antibiotics were given. A well-padded tourniquet was then placed on the right brachium and sealed with the appropriate drape. The right upper extremity was  then prepped and draped in normal sterile fashion. A timeout was called the correct site was identified and the procedure was then begun. Attention was then turned to the right hand. A several centimeter incision made directly in the mid palm. Dissection carried down through the skin and subcutaneous tissue. The palmar fascia was incised longitudinally. Direct exposure the transverse carpal ligament was done and under direct visualization the distal one half of the transverse carpal ligament released distally. Further exposure was then carried out proximally where the nerve was then protected and the remaining portion the transverse carpal ligament as well as portion the antebrachial fascia was then released. The wound was then thoroughly irrigated. The contents the carpal canal were then inspected and no other abnormalities were noted. The wound was then irrigated and the skin was then closed using simple Prolene suture. Attention was then turned to the elbow. Several centimeter incision along the lateral course of the elbow. Dissection carried down through the skin and subcutaneous tissue. Going through the extensor interval the joint capsule was then exposed and joint arthrotomy was then carried out. The patient did have a grossly unstable proximal radius injury. Several loose fragments of the radial head were then excised. The remaining portion the radial head was then excised through the nonunion site. The joint was then thoroughly irrigated. Synovectomy was then carried out on the lateral aspect of the joint. The patient surprisingly did have relatively good articular surfaces to the radiocapitellar joint an ulnar humeral joint.after excision of the nonunion of the radial head and synovectomy the capsule and extensor interval was then closed with 2-0 Vicryl suture. The subcutaneous tissues closed with 4-0 Vicryl. The skin closed with simple 4-0 Prolene suture. Xeroform dressing a sterile compressive bandage  then applied. Patient was then placed in well-padded long-arm splint  and and taken recovery room in good condition.[name]   POSTOPERATIVE PLAN:patient be discharged to home seen back now office in approximately 2 weeks for wound check x-rays down to see her therapist for a long-arm splint and begin some gentle active range of motion use of the elbow.  Padded glove for the carpal canal incision. Lifting restrictions for the first 6 weeks

## 2017-11-03 NOTE — Anesthesia Postprocedure Evaluation (Signed)
Anesthesia Post Note  Patient: Alex [name]  Procedure(s) Performed: RIGHT HAND CARPAL TUNNEL RELEASE (Right ) RIGHT PROXIMAL RADIUS RADIAL HEAD RESECTION AND JOINT DEBRIDEMENT (Right )     Patient location during evaluation: PACU Anesthesia Type: Regional Level of consciousness: awake and alert Pain management: pain level controlled Vital Signs Assessment: post-procedure vital signs reviewed and stable Respiratory status: spontaneous breathing, nonlabored ventilation and respiratory function stable Cardiovascular status: blood pressure returned to baseline and stable Postop Assessment: no apparent nausea or vomiting Anesthetic complications: no    Last Vitals:  Vitals:   11/03/17 1455 11/03/17 1528  BP:  (!) 134/98  Pulse: 76 79  Resp: 15 17  Temp:  36.6 C  SpO2: 97% 100%    Last Pain:  Vitals:   11/03/17 1528  TempSrc: Tympanic  PainSc: 0-No pain                 Lynda Rainwater

## 2017-11-03 NOTE — Discharge Instructions (Signed)
KEEP BANDAGE CLEAN AND DRY CALL OFFICE FOR F/U APPT [phone] in 14 days DR Caralyn Guile [phone] RX SENT TO [facility] HAND ELEVATED ABOVE HEART OK TO APPLY ICE TO OPERATIVE AREA CONTACT OFFICE IF ANY WORSENING PAIN OR CONCERNS.  Call your surgeon if you experience:   1.  Fever over 101.0. 2.  Inability to urinate. 3.  Nausea and/or vomiting. 4.  Extreme swelling or bruising at the surgical site. 5.  Continued bleeding from the incision. 6.  Increased pain, redness or drainage from the incision. 7.  Problems related to your pain medication. 8.  Any problems and/or concerns  Regional Anesthesia Blocks  1. Numbness or the inability to move the "blocked" extremity may last from 3-48 hours after placement. The length of time depends on the medication injected and your individual response to the medication. If the numbness is not going away after 48 hours, call your surgeon.  2. The extremity that is blocked will need to be protected until the numbness is gone and the  Strength has returned. Because you cannot feel it, you will need to take extra care to avoid injury. Because it may be weak, you may have difficulty moving it or using it. You may not know what position it is in without looking at it while the block is in effect.  3. For blocks in the legs and feet, returning to weight bearing and walking needs to be done carefully. You will need to wait until the numbness is entirely gone and the strength has returned. You should be able to move your leg and foot normally before you try and bear weight or walk. You will need someone to be with you when you first try to ensure you do not fall and possibly risk injury.  4. Bruising and tenderness at the needle site are common side effects and will resolve in a few days.  5. Persistent numbness or new problems with movement should be communicated to the surgeon or the [facility] [phone] [place]  [phone]).  Post Anesthesia Home Care Instructions  Activity: Get plenty of rest for the remainder of the day. A responsible individual must stay with you for 24 hours following the procedure.  For the next 24 hours, DO NOT: -Drive a car -Paediatric nurse -Drink alcoholic beverages -Take any medication unless instructed by your physician -Make any legal decisions or sign important papers.  Meals: Start with liquid foods such as gelatin or soup. Progress to regular foods as tolerated. Avoid greasy, spicy, heavy foods. If nausea and/or vomiting occur, drink only clear liquids until the nausea and/or vomiting subsides. Call your physician if vomiting continues.  Special Instructions/Symptoms: Your throat may feel dry or sore from the anesthesia or the breathing tube placed in your throat during surgery. If this causes discomfort, gargle with warm salt water. The discomfort should disappear within 24 hours.  If you had a scopolamine patch placed behind your ear for the management of post- operative nausea and/or vomiting:  1. The medication in the patch is effective for 72 hours, after which it should be removed.  Wrap patch in a tissue and discard in the trash. Wash hands thoroughly with soap and water. 2. You may remove the patch earlier than 72 hours if you experience unpleasant side effects which may include dry mouth, dizziness or visual disturbances. 3. Avoid touching the patch. Wash your hands with soap and water after contact with the patch.

## 2017-11-04 ENCOUNTER — Encounter (HOSPITAL_BASED_OUTPATIENT_CLINIC_OR_DEPARTMENT_OTHER): Payer: Self-pay | Admitting: Orthopedic Surgery

## 2017-11-10 ENCOUNTER — Ambulatory Visit (HOSPITAL_COMMUNITY): Payer: Self-pay | Admitting: Licensed Clinical Social Worker

## 2017-11-14 DIAGNOSIS — Z4789 Encounter for other orthopedic aftercare: Secondary | ICD-10-CM | POA: Insufficient documentation

## 2017-11-17 ENCOUNTER — Ambulatory Visit (INDEPENDENT_AMBULATORY_CARE_PROVIDER_SITE_OTHER): Payer: Managed Care, Other (non HMO) | Admitting: Internal Medicine

## 2017-11-17 ENCOUNTER — Other Ambulatory Visit (INDEPENDENT_AMBULATORY_CARE_PROVIDER_SITE_OTHER): Payer: Managed Care, Other (non HMO)

## 2017-11-17 ENCOUNTER — Encounter: Payer: Self-pay | Admitting: Internal Medicine

## 2017-11-17 DIAGNOSIS — R5382 Chronic fatigue, unspecified: Secondary | ICD-10-CM

## 2017-11-17 DIAGNOSIS — F411 Generalized anxiety disorder: Secondary | ICD-10-CM

## 2017-11-17 DIAGNOSIS — T148XXA Other injury of unspecified body region, initial encounter: Secondary | ICD-10-CM

## 2017-11-17 DIAGNOSIS — F329 Major depressive disorder, single episode, unspecified: Secondary | ICD-10-CM | POA: Diagnosis not present

## 2017-11-17 LAB — CBC WITH DIFFERENTIAL/PLATELET
Basophils Absolute: 0.1 10*3/uL (ref 0.0–0.1)
Basophils Relative: 1.1 % (ref 0.0–3.0)
Eosinophils Absolute: 0.2 10*3/uL (ref 0.0–0.7)
Eosinophils Relative: 1.7 % (ref 0.0–5.0)
HCT: 51 % (ref 39.0–52.0)
Hemoglobin: 17 g/dL (ref 13.0–17.0)
Lymphocytes Relative: 14.8 % (ref 12.0–46.0)
Lymphs Abs: 1.6 10*3/uL (ref 0.7–4.0)
MCHC: 33.4 g/dL (ref 30.0–36.0)
MCV: 84 fl (ref 78.0–100.0)
Monocytes Absolute: 1 10*3/uL (ref 0.1–1.0)
Monocytes Relative: 9.2 % (ref 3.0–12.0)
Neutro Abs: 7.7 10*3/uL (ref 1.4–7.7)
Neutrophils Relative %: 73.2 % (ref 43.0–77.0)
Platelets: 254 10*3/uL (ref 150.0–400.0)
RBC: 6.07 Mil/uL — ABNORMAL HIGH (ref 4.22–5.81)
RDW: 15.8 % — ABNORMAL HIGH (ref 11.5–15.5)
WBC: 10.5 10*3/uL (ref 4.0–10.5)

## 2017-11-17 LAB — BASIC METABOLIC PANEL
BUN: 17 mg/dL (ref 6–23)
CO2: 30 mEq/L (ref 19–32)
Calcium: 8.8 mg/dL (ref 8.4–10.5)
Chloride: 100 mEq/L (ref 96–112)
Creatinine, Ser: 1.06 mg/dL (ref 0.40–1.50)
GFR: 74.77 mL/min (ref >60.00)
Glucose, Bld: 97 mg/dL (ref 70–99)
Potassium: 4.1 mEq/L (ref 3.5–5.1)
Sodium: 137 mEq/L (ref 135–145)

## 2017-11-17 LAB — APTT: aPTT: 33.8 s — ABNORMAL HIGH (ref 23.4–32.7)

## 2017-11-17 LAB — PROTIME-INR
INR: 1 ratio (ref 0.8–1.0)
Prothrombin Time: 10.4 s (ref 9.6–13.1)

## 2017-11-17 LAB — TSH: TSH: 6.25 u[IU]/mL — ABNORMAL HIGH (ref 0.35–4.50)

## 2017-11-17 MED ORDER — TADALAFIL 20 MG PO TABS: 20.0000 mg | ORAL_TABLET | Freq: Every day | ORAL | 5 refills | Status: DC | PRN

## 2017-11-17 MED ORDER — ALBUTEROL SULFATE HFA 108 (90 BASE) MCG/ACT IN AERS: 2.0000 | INHALATION_SPRAY | RESPIRATORY_TRACT | 6 refills | Status: DC | PRN

## 2017-11-17 NOTE — Assessment & Plan Note (Signed)
Stress discussed 

## 2017-11-17 NOTE — Assessment & Plan Note (Signed)
?  etiology CBC, INR

## 2017-11-17 NOTE — Progress Notes (Signed)
Subjective:  Patient ID: MCCABE GLORIA, male    DOB: 05/24/1956  Age: 62 y.o. MRN: [id number]  CC: No chief complaint on file.   HPI Braelen Sproule Burch presents for bruising under the cast and fatigue [name] had R arm radial head removal and a CTS surgery 2 wks ago  Outpatient Medications Prior to Visit  Medication Sig Dispense Refill  . aspirin EC 81 MG tablet Take 1 tablet by mouth daily.    [name] atorvastatin (LIPITOR) 40 MG tablet Take 1 tablet (40 mg total) by mouth daily. (Patient taking differently: Take 40 mg by mouth at bedtime. ) 90 tablet 1  . AXIRON 30 MG/ACT SOLN Place 2 Act onto the skin daily.    . budesonide-formoterol (SYMBICORT) 80-4.5 MCG/ACT inhaler Inhale 2 puffs into the lungs 2 (two) times daily. (Patient taking differently: Inhale 2 puffs 2 (two) times daily into the lungs. ) 1 Inhaler 0  . Coenzyme Q10 (CO Q 10 PO) Take by mouth daily.    . Immune Globulin, Human, (HIZENTRA) 4 GM/20ML SOLN Inject into the skin once a week. wednesday    . LamoTRIgine 300 MG TB24 24 hour tablet Take 1 tablet (300 mg total) by mouth daily. 90 tablet 0  . latanoprost (XALATAN) 0.005 % ophthalmic solution 1 DROP EACH EYE AT NIGHT    . levalbuterol (XOPENEX HFA) 45 MCG/ACT inhaler Inhale 2 puffs into the lungs every 6 (six) hours as needed for wheezing. 1 Inhaler 5  . levothyroxine (SYNTHROID, LEVOTHROID) 150 MCG tablet TAKE 1 TABLET BY MOUTH EVERY DAY 90 tablet 3  . lidocaine-prilocaine (EMLA) cream as needed.    [name] LORazepam (ATIVAN) 0.5 MG tablet Take 1 tab daily as needed and 1 tab at bed time 60 tablet 2  . LUTEIN PO Take by mouth daily.    . meloxicam (MOBIC) 15 MG tablet TAKE 1 TABLET BY MOUTH EVERY DAY 30 tablet 2  . OMEGA-3 KRILL OIL PO Take by mouth daily.    [name] Respiratory Therapy Supplies (FLUTTER) DEVI Use as directed 1 each 0  . sildenafil (VIAGRA) 25 MG tablet Take 5 mg by mouth daily as needed for erectile dysfunction (patient  states).    [name] Spacer/Aero-Holding Chambers  (AEROCHAMBER MV) inhaler Use as instructed 1 each 0  . sucralfate (CARAFATE) 1 G tablet Take 1 g by mouth as needed.   3  . telmisartan (MICARDIS) 80 MG tablet TAKE 1 TABLET (80 MG TOTAL) BY MOUTH DAILY. 90 tablet 3  . zolpidem (AMBIEN) 10 MG tablet Take 1 tablet (10 mg total) by mouth at bedtime as needed. for sleep 30 tablet 2  . tadalafil (CIALIS) 20 MG tablet Take 1 tablet (20 mg total) by mouth daily as needed for erectile dysfunction. 10 tablet 3   No facility-administered medications prior to visit.     ROS Review of Systems  Constitutional: Positive for fatigue. Negative for appetite change and unexpected weight change.  HENT: Negative for congestion, nosebleeds, sneezing, sore throat and trouble swallowing.   Eyes: Negative for itching and visual disturbance.  Respiratory: Negative for cough.   Cardiovascular: Negative for chest pain, palpitations and leg swelling.  Gastrointestinal: Negative for abdominal distention, blood in stool, diarrhea and nausea.  Genitourinary: Negative for frequency and hematuria.  Musculoskeletal: Positive for arthralgias. Negative for back pain, gait problem, joint swelling and neck pain.  Skin: Negative for rash.  Neurological: Negative for dizziness, tremors, speech difficulty and weakness.  Hematological: Bruises/bleeds easily.  Psychiatric/Behavioral: Negative  for agitation, dysphoric mood, sleep disturbance and suicidal ideas. The patient is not nervous/anxious.     Objective:  BP (!) 154/96 (BP Location: Left Arm, Patient Position: Sitting, Cuff Size: Normal)   Pulse 81   Temp 98.2 F (36.8 C) (Oral)   Ht 6' (1.829 m)   Wt 179 lb (81.2 kg)   SpO2 100%   BMI 24.28 kg/m   BP Readings from Last 3 Encounters:  11/17/17 (!) 154/96  11/03/17 (!) 134/98  08/01/17 (!) 160/110    Wt Readings from Last 3 Encounters:  11/17/17 179 lb (81.2 kg)  11/03/17 175 lb 2 oz (79.4 kg)  08/01/17 181 lb 4 oz (82.2 kg)    Physical Exam    Constitutional: He is oriented to person, place, and time. He appears well-developed. No distress.  NAD  HENT:  Mouth/Throat: Oropharynx is clear and moist.  Eyes: Pupils are equal, round, and reactive to light. Conjunctivae are normal.  Neck: Normal range of motion. No JVD present. No thyromegaly present.  Cardiovascular: Normal rate, regular rhythm, normal heart sounds and intact distal pulses. Exam reveals no gallop and no friction rub.  No murmur heard. Pulmonary/Chest: Effort normal and breath sounds normal. No respiratory distress. He has no wheezes. He has no rales. He exhibits no tenderness.  Abdominal: Soft. Bowel sounds are normal. He exhibits no distension and no mass. There is no tenderness. There is no rebound and no guarding.  Musculoskeletal: Normal range of motion. He exhibits tenderness. He exhibits no edema.  Lymphadenopathy:    He has no cervical adenopathy.  Neurological: He is alert and oriented to person, place, and time. He has normal reflexes. No cranial nerve deficit. He exhibits normal muscle tone. He displays a negative Romberg sign. Coordination and gait normal.  Skin: Skin is warm and dry. No rash noted.  Psychiatric: He has a normal mood and affect. His behavior is normal. Judgment and thought content normal.  R arm is in the cast R arm bruises  Lab Results  Component Value Date   WBC 11.5 (H) 06/27/2017   HGB 18.0 (H) 11/03/2017   HCT 53.0 (H) 11/03/2017   PLT 272 06/27/2017   GLUCOSE 97 11/03/2017   CHOL 151 07/23/2016   TRIG 175 (H) 07/23/2016   HDL 74 07/23/2016   LDLDIRECT 59.0 02/02/2016   LDLCALC 42 07/23/2016   ALT 40 06/27/2017   AST 26 06/27/2017   NA 143 11/03/2017   K 3.9 11/03/2017   CL 99 06/09/2017   CREATININE 1.1 06/27/2017   BUN 19.2 06/27/2017   CO2 23 06/27/2017   TSH 3.10 01/22/2017   PSA 1.5 07/23/2016   HGBA1C 6.1 08/04/2012    No results found.  Assessment & Plan:   There are no diagnoses linked to this  encounter. I am having Trinna Balloon. Romick "[name]" maintain his Immune Globulin (Human), Coenzyme Q10 (CO Q 10 PO), OMEGA-3 KRILL OIL PO, sucralfate, AXIRON, lidocaine-prilocaine, aspirin EC, LUTEIN PO, FLUTTER, AEROCHAMBER MV, budesonide-formoterol, levalbuterol, tadalafil, levothyroxine, atorvastatin, latanoprost, LamoTRIgine, LORazepam, zolpidem, meloxicam, telmisartan, and sildenafil.  No orders of the defined types were placed in this encounter.    Follow-up: No follow-ups on file.  Walker Kehr, MD

## 2017-11-17 NOTE — Assessment & Plan Note (Signed)
[facility] Labs Treat insomnia

## 2017-11-17 NOTE — Assessment & Plan Note (Signed)
Lorazepam prn  Potential benefits of a long term benzodiazepines  use as well as potential risks  and complications were explained to the patient and were aknowledged.  

## 2017-11-24 ENCOUNTER — Ambulatory Visit (INDEPENDENT_AMBULATORY_CARE_PROVIDER_SITE_OTHER): Payer: Managed Care, Other (non HMO) | Admitting: Licensed Clinical Social Worker

## 2017-11-24 ENCOUNTER — Encounter (HOSPITAL_COMMUNITY): Payer: Self-pay | Admitting: Licensed Clinical Social Worker

## 2017-11-24 DIAGNOSIS — F319 Bipolar disorder, unspecified: Secondary | ICD-10-CM | POA: Diagnosis not present

## 2017-11-24 NOTE — Progress Notes (Signed)
Therapist Progress Note   Time: 3:10-4 pm  Participation Level: Active  Behavioral Response: Casual, Alert, Anxious  Type of Therapy: Individual Therapy  Treatment Goals Addressed: Coping  Interventions: Supportive/ CBT  Summary: Pt presented for individual therapy. Pt discussed his psychiatric symptoms and current life events. Pt discussed his psychiatric symptoms and reports his mood is stable. Pt had his elbow and carpal tunnel surgery and is healing well and participates in PT. Pt's wife was accommodating in his healing process. Pt was very positive in talking about the support of his wife today. Asked open ended questions. Pt again talked about his son that suffers with mental illness. Gave pt information on [facility] and [facility] a facility in [place] that is a residential tx program. Pt become tearful and has feelings of hopelessness when he speaks of his son. Asked open ended questions about his concerns about his son. Continue to encourage pt to live in the present and practice mindfulness.            Suicidal/Homicidal: No  Therapist Response: Assessed pt's current functioning and reviewed progress. Assisted pt processing family, surgery, mindfulness practice. Assisted pt  processing for management of stressors.  Plan: Return in 4 weeks using CBT  Diagnosis: Axis 1: Bipolar 1 disorder,   [name], LCAS 11/24/17

## 2017-12-08 ENCOUNTER — Encounter (HOSPITAL_COMMUNITY): Payer: Self-pay | Admitting: Licensed Clinical Social Worker

## 2017-12-08 ENCOUNTER — Ambulatory Visit (INDEPENDENT_AMBULATORY_CARE_PROVIDER_SITE_OTHER): Payer: 59 | Admitting: Licensed Clinical Social Worker

## 2017-12-08 DIAGNOSIS — F319 Bipolar disorder, unspecified: Secondary | ICD-10-CM

## 2017-12-08 NOTE — Progress Notes (Signed)
Therapist Progress Note   Time: 4:10-5 pm  Participation Level: Active  Behavioral Response: Casual, Alert, Anxious  Type of Therapy: Individual Therapy  Treatment Goals Addressed: Coping  Interventions: Supportive/ CBT  Summary: Pt presented for individual therapy. Pt discussed his psychiatric symptoms and current life events. Pt discussed his psychiatric symptoms and reports his moods are stable. He presents today anxious because he had a really hard week with his family. Expectations and decisions for his children. Pt does not feel supported by his wife. Asked open ended questions.  Pt continues to whine and complain but chooses to change nothing. No one in his family respects him. Discussed all this with pt. What is he willing to change. If nothing changes, nothing changes. Continue to discuss problem solving within his disagreements with his wife. Continue to encourage pt to live in the present and practice mindfulness, but he reports it is a struggle to live in the present. Will continue to work on this with pt.            Suicidal/Homicidal: No/ without plan  Therapist Response: Assessed pt's current functioning and reviewed progress. Assisted pt processing family, son's surgery, mindfulness practice, problem solving, respect. Assisted pt  processing for management of stressors.  Plan: Return in 4 weeks using CBT  Diagnosis: Axis 1: Bipolar 1 disorder,   [name], LCAS 12/08/17

## 2017-12-10 ENCOUNTER — Other Ambulatory Visit: Payer: Self-pay | Admitting: Gastroenterology

## 2017-12-10 DIAGNOSIS — K769 Liver disease, unspecified: Secondary | ICD-10-CM

## 2017-12-22 ENCOUNTER — Encounter (HOSPITAL_COMMUNITY): Payer: Self-pay | Admitting: Psychiatry

## 2017-12-22 ENCOUNTER — Ambulatory Visit (INDEPENDENT_AMBULATORY_CARE_PROVIDER_SITE_OTHER): Payer: 59 | Admitting: Psychiatry

## 2017-12-22 ENCOUNTER — Ambulatory Visit
Admission: RE | Admit: 2017-12-22 | Discharge: 2017-12-22 | Disposition: A | Discharge status: Home / Self Care | Payer: Managed Care, Other (non HMO) | Source: Ambulatory Visit | Attending: Gastroenterology | Admitting: Gastroenterology

## 2017-12-22 DIAGNOSIS — F411 Generalized anxiety disorder: Secondary | ICD-10-CM | POA: Diagnosis not present

## 2017-12-22 DIAGNOSIS — K769 Liver disease, unspecified: Secondary | ICD-10-CM

## 2017-12-22 DIAGNOSIS — F3162 Bipolar disorder, current episode mixed, moderate: Secondary | ICD-10-CM | POA: Diagnosis not present

## 2017-12-22 DIAGNOSIS — Z813 Family history of other psychoactive substance abuse and dependence: Secondary | ICD-10-CM

## 2017-12-22 DIAGNOSIS — Z818 Family history of other mental and behavioral disorders: Secondary | ICD-10-CM

## 2017-12-22 DIAGNOSIS — F319 Bipolar disorder, unspecified: Secondary | ICD-10-CM

## 2017-12-22 MED ORDER — LORAZEPAM 0.5 MG PO TABS: ORAL_TABLET | ORAL | 2 refills | Status: DC

## 2017-12-22 MED ORDER — ZOLPIDEM TARTRATE 10 MG PO TABS: 10.0000 mg | ORAL_TABLET | Freq: Every evening | ORAL | 2 refills | Status: DC | PRN

## 2017-12-22 MED ORDER — LAMOTRIGINE ER 300 MG PO TB24: 1.0000 | ORAL_TABLET | Freq: Every day | ORAL | 0 refills | Status: DC

## 2017-12-22 NOTE — Progress Notes (Signed)
BH MD/PA/NP OP Progress Note  12/22/2017 12:17 PM Alex [name]  MRN:  [id number]  Chief Complaint: I have a lot of things in past few weeks.  HPI: Alex [name] came for his follow-up appointment.  He is taking his medication as prescribed.  He continued to endorse family drama.  His son again admitted because he swallowed a paperclip and this time he require a major surgery.  Patient was very upset when he find out that after the surgery he again swallowed a paperclip.  But he had a good news, his daughter delivered the baby on patient's birthday.  Patient is happy that [place] and daughter doing very well.  Overall he describes his mood sometimes anxious when he here family problems.  Recently he had a surgery on his right hand and is recovering from it.  He is seeing Alex [name] for therapy.  He denies any agitation or any anger.  His wife is working at [employer]  Patient is taking Lamictal, Ativan and Ambien.  He has no rash, itching tremors or shakes.  Recently he seen his primary care physician and he had a blood work.  Patient denies drinking alcohol or using any illegal substances.  He like to continue his current psychiatric medication.  He has no concern from the medication.  His appetite is okay.  Visit Diagnosis:    ICD-10-CM   1. Bipolar 1 disorder (HCC) F31.9 LORazepam (ATIVAN) 0.5 MG tablet  2. Bipolar 1 disorder, mixed, moderate (HCC) F31.62 zolpidem (AMBIEN) 10 MG tablet  3. Bipolar I disorder (HCC) F31.9 LamoTRIgine 300 MG TB24 24 hour tablet    Past Psychiatric History: Viewed Patient has been seeing in this office since 2009. He has history of depression mood swing and anger. He's been admitted to behavioral [place] due to suicidal thoughts but he denies any suicidal attempt. He denies any psychosis but admitted poor impulse control. In the past he has taken Seroquel and Cymbalta  Past Medical History:  Past Medical History:  Diagnosis Date  . Anemia   . Anxiety    . Bipolar I disorder ([place])   . Bleeding ulcer 06/2014  . Bleeding ulcer 2016  . BPH (benign prostatic hypertrophy) with urinary obstruction   . Cataract    LEFT EYE  . Chronic back pain   . Complication of anesthesia POST URINARY RETENTION---  2006 SHOULDER SURGERY MARKED BRADYCARDIA VAGAL RESPONSE NO ISSUE W/ SURGERY AFTER THIS ONE   WITH GENERAL ANESTHESIA, 15 YRS AGO VASOVAGAL REACTION NONE SINCE  . Coronary atherosclerosis CARDIOLOGIST- DR CRENSHAW--  LAST VISIT 01-05-2012 IN EPIC   NON-OBSTRUCTIVE MILD DISEASE  . 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 Depression   . Epicondylitis    right elbow  . GERD (gastroesophageal reflux disease)   . Glaucoma BOTH EYES   RIGHT EYE RADIATION DAMAGE  . Hearing loss    BOTH EARS  . History of chronic prostatitis   . History of hiatal hernia    SMALL  . History of kidney stones   . 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 History of thyroid cancer PRIMARY (NO METS FROM ORBITAL CANCER)--   IN REMISSION   S/P TOTAL THYROIDECTOMY  , CHEMORADIATION  (ONCOLOGIST -- DR Griffith Citron)  . Hyperlipidemia   .  Hypertension   . Nocturia   . OA (osteoarthritis)   . Peripheral vascular disease (HCC)    THORACIC AA 3. 9 CM X 4. 3 CM PER NOV 06-14-17  CHEST CTFOLOWED BY DR CRENSHAW YEARLY FOR  . Positional vertigo    HX OF WITH SINUS INFECTIONS  . Tinnitus    CONSTANT  . Ulnar nerve compression    right elbow  . Urinary hesitancy     Past Surgical History:  Procedure Laterality Date  . CARDIAC CATHETERIZATION  01-16-2006  DR Marcello Moores WALL   MILD CORONARY ATHEROSCLEROSIS/ MID TO DISTAL LAD 40% STENOSIS/ LVF 50-55%  . CARPAL TUNNEL RELEASE Right 11/03/2017   Procedure: RIGHT HAND CARPAL TUNNEL RELEASE;  Surgeon: Iran Planas, MD;  Location: [facility];  Service:  Orthopedics;  Laterality: Right;  . COLONSCOPY  2017 LAST DONE   MULTIPLE  . ENDOSCOPY  LAST 2017   MULTIPLE DONE DILATION DONE ALSO  . EXCISION RADIAL HEAD Right 11/03/2017   Procedure: RIGHT PROXIMAL RADIUS RADIAL HEAD RESECTION AND JOINT DEBRIDEMENT;  Surgeon: Iran Planas, MD;  Location: [facility];  Service: Orthopedics;  Laterality: Right;  . KNEE ARTHROSCOPY  05/01/2012   Procedure: ARTHROSCOPY KNEE;  Surgeon: Johnn Hai, MD;  Location[facility];  Service: Orthopedics;  Laterality: Left;  debridement and removal of loose body  . LEFT ANKLE ARTHROSCOPY W/ DEBRIDEMENT  05-12-2007  . LEFT HYDROCELECTOMY  03-29-2005   AND REPAIR LEFT INGUINAL HERNIA W/ MESH  . MOHS SURGERY  2002   RIGHT ORBITAL CANCER  . NASAL ENDOSCOPY  08-07-2005   RIGHT EPISTAXIS  / POST SEPTOPLASTY  (HX RIGHT ORBITAL CA & S/P RADIATION/ NECROSIS ANTERIOR END OF BOTH INFERIOR TURBINATES)  . occuloplastic surgery  2002  . PARS PLANA VITRECTOMY  11-06-2004   RIGHT EYE RADIATION RETINOPATHY W/ HEMORRHAGE  . REPAIR UNDESENDED RIGHT TESTICLE / RIGHT INGUINAL HERNIA  AGE 22  . RIGHT ANKLE ARTHROSCOPY W/ EXTENSIVE DEBRIDEMENT  04-05-2008   . RIGHT SHOULDER SURGERY  2006  . RIGHT SUPRAOMOHYOID NECK DISSECTION   03-08-2003   ZONES 1,2,3;   SUBMANDIBULAR MASS / METASTATIC SQUAMOUS CELL CARCINOMA RIGHT NECK  . SEPTOPLASTY  NOV 2006  . SHOULDER ARTHROSCOPY W/ SUBACROMIAL DECOMPRESSION AND DISTAL CLAVICLE EXCISION  10-09-2008   AND DEBRIDEMENT OF RIGHT SHOULDER IMPINGEMENT & [facility] JOINT ARTHRITIS  . SPINE SURGERY  2016   l 3 TO l 4 PLATE AND SCREWS  . TOTAL THYROIDECTOMY  11-03-2001   PAPILLARY THYROID CARCINOMA  . TRANSTHORACIC ECHOCARDIOGRAM  12/ 2012   grade I diastolic dysfunction/ ef 85-27%  . ULNAR NERVE TRANSPOSITION Right 04/28/2014   Procedure: RIGHT ELBOW ULNA NERVE RELEASE TRANSPOSTION AND MEDIAL EPICONDYLAR DEBRIDEMENT AND REPAIR;  Surgeon: Linna Hoff, MD;  Location: [facility];  Service: Orthopedics;  Laterality: Right;    Family Psychiatric History: Reviewed.  Family History:  Family History  Problem Relation Age of Onset  . Depression Sister   . Rectal cancer Sister   . Lung cancer Brother   . Kidney disease Mother   . Depression Mother   . Stroke Father   . COPD Father   . Hypertension Father   . Prostate cancer Father   . Depression Daughter   . Drug abuse Daughter   . Psychiatric Illness Son     Social History:  Social History   Socioeconomic History  . Marital status: Married    Spouse name: Not on file  .  Number of children: Not on file  . Years of education: Not on file  . Highest education level: Not on file  Occupational History  . Not on file  Social Needs  . Financial resource strain: Not on file  . Food insecurity:    Worry: Not on file    Inability: Not on file  . Transportation needs:    Medical: Not on file    Non-medical: Not on file  Tobacco Use  . Smoking status: Never Smoker  . Smokeless tobacco: Never Used  Substance and Sexual Activity  . Alcohol use: Yes    Alcohol/week: 0.0 oz    Comment: 1-2 glasses per month  . Drug use: No  . Sexual activity: Yes    Partners: Female    Birth control/protection: None  Lifestyle  . Physical activity:    Days per week: Not on file    Minutes per session: Not on file  . Stress: Not on file  Relationships  . Social connections:    Talks on phone: Not on file    Gets together: Not on file    Attends religious service: Not on file    Active member of club or organization: Not on file    Attends meetings of clubs or organizations: Not on file    Relationship status: Not on file  Other Topics Concern  . Not on file  Social History Narrative   Originally from [place]. Moved to [place] in 1984. 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  without a mask. No international travel other than [place]. Has a dog at home, poodle mix. His daughter who lives with him now has a dog. No mold exposure. No bird exposure. No hot tub exposure. Enjoys gardening.     Allergies:  Allergies  Allergen Reactions  . Percocet [Oxycodone-Acetaminophen] Itching    Can take generic.  States only has a problem with percocet brand, also headache  . Vicodin [Hydrocodone-Acetaminophen]     Itching and sleep disruption, can take generic    Metabolic Disorder Labs: Recent Results (from the past 2160 hour(s))  I-STAT 4, (NA,K, GLUC, HGB,HCT)     Status: Abnormal   Collection Time: 11/03/17 11:17 AM  Result Value Ref Range   Sodium 143 135 - 145 mmol/L   Potassium 3.9 3.5 - 5.1 mmol/L   Glucose, Bld 97 65 - 99 mg/dL   HCT 53.0 (H) 39.0 - 52.0 %   Hemoglobin 18.0 (H) 13.0 - 17.0 g/dL  TSH     Status: Abnormal   Collection Time: 11/17/17  3:10 PM  Result Value Ref Range   TSH 6.25 (H) 0.35 - 4.50 uIU/mL  APTT     Status: Abnormal   Collection Time: 11/17/17  3:10 PM  Result Value Ref Range   aPTT 33.8 (H) 23.4 - 32.7 SEC  Protime-INR     Status: None   Collection Time: 11/17/17  3:10 PM  Result Value Ref Range   INR 1.0 0.8 - 1.0 ratio   Prothrombin Time 10.4 9.6 - 13.1 sec  CBC with Differential/Platelet     Status: Abnormal   Collection Time: 11/17/17  3:10 PM  Result Value Ref Range   WBC 10.5 4.0 - 10.5 K/uL   RBC 6.07 (H) 4.22 - 5.81 Mil/uL   Hemoglobin 17.0 13.0 - 17.0 g/dL   HCT 51.0 39.0 -  52.0 %   MCV 84.0 78.0 - 100.0 fl   MCHC 33.4 30.0 - 36.0 g/dL   RDW 15.8 (H) 11.5 - 15.5 %   Platelets 254.0 150.0 - 400.0 K/uL   Neutrophils Relative % 73.2 43.0 - 77.0 %   Lymphocytes Relative 14.8 12.0 - 46.0 %   Monocytes Relative 9.2 3.0 - 12.0 %   Eosinophils Relative 1.7 0.0 - 5.0 %   Basophils Relative 1.1 0.0 - 3.0 %   Neutro Abs 7.7 1.4 - 7.7 K/uL   Lymphs Abs 1.6 0.7 - 4.0 K/uL   Monocytes Absolute 1.0 0.1 - 1.0 K/uL    Eosinophils Absolute 0.2 0.0 - 0.7 K/uL   Basophils Absolute 0.1 0.0 - 0.1 K/uL  Basic metabolic panel     Status: None   Collection Time: 11/17/17  3:10 PM  Result Value Ref Range   Sodium 137 135 - 145 mEq/L   Potassium 4.1 3.5 - 5.1 mEq/L   Chloride 100 96 - 112 mEq/L   CO2 30 19 - 32 mEq/L   Glucose, Bld 97 70 - 99 mg/dL   BUN 17 6 - 23 mg/dL   Creatinine, Ser 1.06 0.40 - 1.50 mg/dL   Calcium 8.8 8.4 - 10.5 mg/dL   GFR 74.77 >60.00 mL/min   Lab Results  Component Value Date   HGBA1C 6.1 08/04/2012   MPG 123 (H) 01/05/2012   No results found for: PROLACTIN Lab Results  Component Value Date   CHOL 151 07/23/2016   TRIG 175 (H) 07/23/2016   HDL 74 07/23/2016   CHOLHDL 2.0 07/23/2016   VLDL 35 (H) 07/23/2016   LDLCALC 42 07/23/2016   LDLCALC 86 08/01/2015   Lab Results  Component Value Date   TSH 6.25 (H) 11/17/2017   TSH 3.10 01/22/2017    Therapeutic Level Labs: No results found for: LITHIUM No results found for: VALPROATE No components found for:  CBMZ  Current Medications: Current Outpatient Medications  Medication Sig Dispense Refill  . albuterol (PROAIR HFA) 108 (90 Base) MCG/ACT inhaler Inhale 2 puffs into the lungs every 4 (four) hours as needed for wheezing or shortness of breath. 1 Inhaler 6  . aspirin EC 81 MG tablet Take 1 tablet by mouth daily.    [name] atorvastatin (LIPITOR) 40 MG tablet Take 1 tablet (40 mg total) by mouth daily. (Patient taking differently: Take 40 mg by mouth at bedtime. ) 90 tablet 1  . AXIRON 30 MG/ACT SOLN Place 2 Act onto the skin daily.    . budesonide-formoterol (SYMBICORT) 80-4.5 MCG/ACT inhaler Inhale 2 puffs into the lungs 2 (two) times daily. (Patient taking differently: Inhale 2 puffs 2 (two) times daily into the lungs. ) 1 Inhaler 0  . Coenzyme Q10 (CO Q 10 PO) Take by mouth daily.    . Immune Globulin, Human, (HIZENTRA) 4 GM/20ML SOLN Inject into the skin once a week. wednesday    . LamoTRIgine 300 MG TB24 24 hour tablet  Take 1 tablet (300 mg total) by mouth daily. 90 tablet 0  . latanoprost (XALATAN) 0.005 % ophthalmic solution 1 DROP EACH EYE AT NIGHT    . levalbuterol (XOPENEX HFA) 45 MCG/ACT inhaler Inhale 2 puffs into the lungs every 6 (six) hours as needed for wheezing. 1 Inhaler 5  . levothyroxine (SYNTHROID, LEVOTHROID) 150 MCG tablet TAKE 1 TABLET BY MOUTH EVERY DAY 90 tablet 3  . lidocaine-prilocaine (EMLA) cream as needed.    [name] LORazepam (ATIVAN) 0.5 MG tablet Take 1  tab daily as needed and 1 tab at bed time 60 tablet 2  . LUTEIN PO Take by mouth daily.    . meloxicam (MOBIC) 15 MG tablet TAKE 1 TABLET BY MOUTH EVERY DAY 30 tablet 2  . OMEGA-3 KRILL OIL PO Take by mouth daily.    [name] Respiratory Therapy Supplies (FLUTTER) DEVI Use as directed 1 each 0  . sildenafil (VIAGRA) 25 MG tablet Take 5 mg by mouth daily as needed for erectile dysfunction (patient  states).    [name] Spacer/Aero-Holding Chambers (AEROCHAMBER MV) inhaler Use as instructed 1 each 0  . sucralfate (CARAFATE) 1 G tablet Take 1 g by mouth as needed.   3  . tadalafil (CIALIS) 20 MG tablet Take 1 tablet (20 mg total) by mouth daily as needed for erectile dysfunction. 30 tablet 5  . telmisartan (MICARDIS) 80 MG tablet TAKE 1 TABLET (80 MG TOTAL) BY MOUTH DAILY. 90 tablet 3  . zolpidem (AMBIEN) 10 MG tablet Take 1 tablet (10 mg total) by mouth at bedtime as needed. for sleep 30 tablet 2   No current facility-administered medications for this visit.      Musculoskeletal: Strength & Muscle Tone: within normal limits Gait & Station: normal Patient leans: N/A  Psychiatric Specialty Exam: ROS  Weight 181 lb (82.1 kg).Body mass index is 24.55 kg/m.  General Appearance: Casual  Eye Contact:  Good  Speech:  Slow  Volume:  Normal  Mood:  Anxious  Affect:  Appropriate  Thought Process:  Goal Directed  Orientation:  Full (Time, Place, and Person)  Thought Content: Logical   Suicidal Thoughts:  No  Homicidal Thoughts:  No  Memory:   Immediate;   Good Recent;   Good Remote;   Good  Judgement:  Good  Insight:  Good  Psychomotor Activity:  Normal  Concentration:  Concentration: Fair and Attention Span: Fair  Recall:  Good  Fund of Knowledge: Good  Language: Good  Akathisia:  No  Handed:  Right  AIMS (if indicated): not done  Assets:  Communication Skills Desire for Improvement Housing Resilience  ADL's:  Intact  Cognition: WNL  Sleep:  Fair   Screenings: GAD-7     Office Visit from 08/01/2017 in [facility]  Total GAD-7 Score  6    PHQ2-9     Office Visit from 08/01/2017 in [facility]  PHQ-2 Total Score  2  PHQ-9 Total Score  4       Assessment and Plan: Bipolar disorder type I.  Generalized anxiety disorder.  Despite family issues patient doing better on his current medication.  Continue Lamictal 300 mg daily, lorazepam 0.5 mg twice a day as needed and Ambien 5 to 10 mg as needed for insomnia.  Encouraged to continue therapy with Alex [name].  I also reviewed blood work results from primary care physician.  Recommended to call us back if he has any question, concern for feel worsening of the symptoms.  Follow-up in 3 months.   Kathlee Nations, MD 12/22/2017, 12:17 PM

## 2017-12-23 ENCOUNTER — Ambulatory Visit (HOSPITAL_COMMUNITY): Payer: Self-pay | Admitting: Licensed Clinical Social Worker

## 2018-01-06 ENCOUNTER — Ambulatory Visit (INDEPENDENT_AMBULATORY_CARE_PROVIDER_SITE_OTHER): Payer: 59 | Admitting: Licensed Clinical Social Worker

## 2018-01-06 ENCOUNTER — Encounter (HOSPITAL_COMMUNITY): Payer: Self-pay | Admitting: Licensed Clinical Social Worker

## 2018-01-06 DIAGNOSIS — F319 Bipolar disorder, unspecified: Secondary | ICD-10-CM

## 2018-01-06 NOTE — Progress Notes (Signed)
Comprehensive Clinical Assessment (CCA) Note  01/06/2018 Alex [name] [id number]  Visit Diagnosis:   No diagnosis found.    CCA Part One  Part One has been completed on paper by the patient.  (See scanned document in Chart Review)  CCA Part Two A  Intake/Chief Complaint:  CCA Intake With Chief Complaint CCA Part Two Date: 01/06/18 CCA Part Two Time: 1612 Chief Complaint/Presenting Problem: Pt continues to have family drama which is his major stressor.  Patients Currently Reported Symptoms/Problems: Pt continues to have depressive symptoms Collateral Involvement: psychiatrist notes Individual's Strengths: family support Individual's Preferences: prefers to have less stress in life Type of Services Patient Feels Are Needed: outpatient  Mental Health Symptoms Depression:  Depression: Irritability, Tearfulness, Change in energy/activity  Mania:  Mania: Irritability, Racing thoughts  Anxiety:   Anxiety: Irritability, Worrying, Tension  Psychosis:     Trauma:     Obsessions:  Obsessions: Cause anxiety(obsesses about his children and becomes emeshed in their lives)  Compulsions:     Inattention:     Hyperactivity/Impulsivity:     Oppositional/Defiant Behaviors:     Borderline Personality:     Other Mood/Personality Symptoms:      Mental Status Exam Appearance and self-care  Stature:  Stature: Average  Weight:  Weight: Thin  Clothing:  Clothing: Casual  Grooming:  Grooming: Neglected  Cosmetic use:  Cosmetic Use: None  Posture/gait:  Posture/Gait: Slumped  Motor activity:  Motor Activity: Not Remarkable  Sensorium  Attention:  Attention: Normal  Concentration:  Concentration: Normal  Orientation:  Orientation: X5  Recall/memory:  Recall/Memory: Normal  Affect and Mood  Affect:  Affect: Depressed  Mood:  Mood: Depressed  Relating  Eye contact:  Eye Contact: Normal  Facial expression:  Facial Expression: Depressed  Attitude toward examiner:  Attitude Toward  Examiner: Cooperative  Thought and Language  Speech flow: Speech Flow: Normal  Thought content:  Thought Content: Appropriate to mood and circumstances  Preoccupation:     Hallucinations:     Organization:     Transport planner of Knowledge:  Fund of Knowledge: Impoverished by:  (Comment)  Intelligence:  Intelligence: Average  Abstraction:  Abstraction: Normal  Judgement:  Judgement: Fair  Art therapist:     Insight:  Insight: Fair  Decision Making:  Decision Making: Normal  Social Functioning  Social Maturity:  Social Maturity: Isolates  Social Judgement:  Social Judgement: Normal  Stress  Stressors:  Stressors: Family conflict, Illness, Money  Coping Ability:  Coping Ability: Deficient supports, English as a second language teacher Deficits:     Supports:      Family and Psychosocial History: Family history Marital status: Married Number of Years Married: 12 What types of issues is patient dealing with in the relationship?: pt does not feel heard by his family.  Additional relationship information: pt is disabled and wife works full time. Does patient have children?: Yes How many children?: 7 How is patient's relationship with their children?: good with his kids, becomes emeshed with all of thier lives  Childhood History:  Childhood History By whom was/is the patient raised?: Both parents Additional childhood history information: good relationship with parents Description of patient's relationship with caregiver when they were a child: good Patient's description of current relationship with people who raised him/her: mother deceased, father in a nursing home Does patient have siblings?: Yes Number of Siblings: 7 Description of patient's current relationship with siblings: 2 are deceased ok relationship wavers, don't see them often Did patient suffer any verbal/emotional/physical/sexual  abuse as a child?: No Did patient suffer from severe childhood neglect?: No Has patient ever  been sexually abused/assaulted/raped as an adolescent or adult?: No Was the patient ever a victim of a crime or a disaster?: No Witnessed domestic violence?: No Has patient been effected by domestic violence as an adult?: No  CCA Part Two B  Employment/Work Situation: Employment / Work Copywriter, advertising Employment situation: On disability Why is patient on disability: mutiple health issues How long has patient been on disability: 13 Are There Guns or Other Weapons in [place]?: No  Education: Education Last Grade Completed: 16 Did Teacher, adult education From [employer]?: Yes Did Physicist, medical?: Yes What Type of College Degree Do you Have?: BS  Religion: Religion/Spirituality Are You A Religious Person?: Yes  Leisure/Recreation: Leisure / Recreation Leisure and Hobbies: spend time with family  Exercise/Diet: Exercise/Diet Do You Exercise?: No Have You Gained or Lost A Significant Amount of Weight in the Past Six Months?: No Do You Follow a Special Diet?: No Do You Have Any Trouble Sleeping?: No  CCA Part Two C  Alcohol/Drug Use: Alcohol / Drug Use History of alcohol / drug use?: No history of alcohol / drug abuse                      CCA Part Three  ASAM's:  Six Dimensions of Multidimensional Assessment  Dimension 1:  Acute Intoxication and/or Withdrawal Potential:     Dimension 2:  Biomedical Conditions and Complications:     Dimension 3:  Emotional, Behavioral, or Cognitive Conditions and Complications:     Dimension 4:  Readiness to Change:     Dimension 5:  Relapse, Continued use, or Continued Problem Potential:     Dimension 6:  Recovery/Living Environment:      Substance use Disorder (SUD)    Social Function:  Social Functioning Social Maturity: Isolates Social Judgement: Normal  Stress:  Stress Stressors: Family conflict, Illness, Money Coping Ability: Deficient supports, Overwhelmed Patient Takes Medications The Way The Doctor Instructed?:  Yes Priority Risk: Low Acuity  Risk Assessment- Self-Harm Potential: Risk Assessment For Self-Harm Potential Thoughts of Self-Harm: No current thoughts Method: No plan Availability of Means: No access/NA  Risk Assessment -Dangerous to Others Potential: Risk Assessment For Dangerous to Others Potential Method: No Plan Availability of Means: No access or NA Intent: Vague intent or NA Notification Required: No need or identified person  DSM5 Diagnoses: Patient Active Problem List   Diagnosis Date Noted  . Bruising 11/17/2017  . Testicular pain, right 08/01/2017  . Fatigue 01/21/2017  . Ankle pain, left 01/21/2017  . Fall (on) (from) other stairs and steps, initial encounter 11/11/2016  . Abrasion of head 11/11/2016  . Thoracic aortic aneurysm ([place]) 07/23/2016  . Erectile dysfunction 07/19/2016  . Bronchiectasis ([place]) 04/23/2016  . Chronic bronchitis ([place]) 03/11/2016  . Acute URI 11/03/2015  . Cerumen impaction 08/04/2015  . Cervical disc disorder with radiculopathy of cervical region 06/06/2014  . Right elbow pain 06/06/2014  . Elevated WBC count 06/06/2014  . Iron deficiency anemia 06/06/2014  . Pre-operative exam 04/07/2014  . Chronic right SI joint pain 02/21/2014  . Edema 02/21/2014  . Tinnitus 02/21/2014  . Well adult exam 05/31/2013  . Glaucoma 05/31/2013  . RML pneumonia ([place]) 03/31/2013  . Hypertension, uncontrolled 02/25/2013  . Ingrowing toenail with infection 04/02/2012  . Localized osteoarthritis of left knee 04/02/2012  . Vertigo 01/05/2012  . Ataxia 01/05/2012  . Anxiety state 08/08/2010  .  ESOPHAGEAL STRICTURE 11/30/2009  . CHANGE IN BOWELS 11/30/2009  . Major depression, chronic 08/07/2009  . Osteoarthritis 08/07/2009  . GENERALIZED OSTEOARTHROSIS UNSPECIFIED SITE 10/20/2008  . Dyslipidemia 10/19/2008  . FOOT PAIN 07/11/2008  . Orchitis and epididymitis 05/24/2008  . RASH-NONVESICULAR 05/14/2008  . Cough 03/29/2008  . Coronary atherosclerosis  11/04/2007  . Lumbago 11/04/2007  . THYROIDECTOMY, HX OF 11/04/2007  . 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 NEOPLASM, MALIGNANT, THYROID GLAND 10/02/2007  . 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 IMPAIRED GLUCOSE TOLERANCE 10/02/2007    Patient Centered Plan: Patient is on the following Treatment Plan(s):  bipolar  Recommendations for Services/Supports/Treatments: Recommendations for Services/Supports/Treatments Recommendations For Services/Supports/Treatments: Individual Therapy, Medication Management  Treatment Plan Summary:  see tx plan  Referrals to Alternative Service(s): Referred to Alternative Service(s):   Place:   Date:   Time:    Referred to Alternative Service(s):   Place:   Date:   Time:    Referred to Alternative Service(s):   Place:   Date:   Time:    Referred to Alternative Service(s):   Place:   Date:   Time:     [name]

## 2018-01-15 ENCOUNTER — Encounter: Payer: Self-pay | Admitting: Internal Medicine

## 2018-01-19 ENCOUNTER — Encounter (HOSPITAL_COMMUNITY): Payer: Self-pay | Admitting: Licensed Clinical Social Worker

## 2018-01-19 ENCOUNTER — Ambulatory Visit (INDEPENDENT_AMBULATORY_CARE_PROVIDER_SITE_OTHER): Payer: 59 | Admitting: Licensed Clinical Social Worker

## 2018-01-19 DIAGNOSIS — Z639 Problem related to primary support group, unspecified: Secondary | ICD-10-CM | POA: Diagnosis not present

## 2018-01-19 DIAGNOSIS — Z599 Problem related to housing and economic circumstances, unspecified: Secondary | ICD-10-CM

## 2018-01-19 DIAGNOSIS — Z630 Problems in relationship with spouse or partner: Secondary | ICD-10-CM | POA: Diagnosis not present

## 2018-01-19 DIAGNOSIS — F319 Bipolar disorder, unspecified: Secondary | ICD-10-CM | POA: Diagnosis not present

## 2018-01-19 NOTE — Progress Notes (Signed)
Therapist Progress Note   Time: 4:10-5 pm  Participation Level: Active  Behavioral Response: Casual, Alert, Anxious  Type of Therapy: Individual Therapy  Treatment Goals Addressed: Coping  Interventions: Supportive/ CBT  Summary: Pt presented for individual therapy. Pt discussed his psychiatric symptoms and current life events. Pt discussed his psychiatric symptoms and reports his moods are stable. Pt continues to discuss his biggest stressor: his family and $.  Asked open ended questions and empathic reflection.  Discussed with pt problem solving future goals. Discussed the importance of planning for the future. Pt vented his fears of insecurity due to his wife not hearing him. Processed this with pt. Asked open ended questions.   Suicidal/Homicidal: No/ without plan  Therapist Response: Assessed pt's current functioning and reviewed progress. Assisted pt processing family, problem solving, planning for the future. Assisted pt  processing for management of stressors.  Plan: Return in 4 weeks using CBT  Diagnosis: Axis 1: Bipolar 1 disorder,   [name], LCAS 01/19/18

## 2018-02-10 ENCOUNTER — Ambulatory Visit (HOSPITAL_COMMUNITY): Payer: Self-pay | Admitting: Licensed Clinical Social Worker

## 2018-02-11 ENCOUNTER — Other Ambulatory Visit: Payer: Self-pay

## 2018-02-11 ENCOUNTER — Other Ambulatory Visit: Payer: Self-pay | Admitting: Gastroenterology

## 2018-02-11 DIAGNOSIS — C73 Malignant neoplasm of thyroid gland: Secondary | ICD-10-CM

## 2018-02-13 ENCOUNTER — Ambulatory Visit (INDEPENDENT_AMBULATORY_CARE_PROVIDER_SITE_OTHER): Payer: Managed Care, Other (non HMO) | Admitting: Internal Medicine

## 2018-02-13 ENCOUNTER — Other Ambulatory Visit (INDEPENDENT_AMBULATORY_CARE_PROVIDER_SITE_OTHER): Payer: Managed Care, Other (non HMO)

## 2018-02-13 ENCOUNTER — Other Ambulatory Visit: Payer: Self-pay

## 2018-02-13 ENCOUNTER — Encounter: Payer: Self-pay | Admitting: Internal Medicine

## 2018-02-13 DIAGNOSIS — M25572 Pain in left ankle and joints of left foot: Secondary | ICD-10-CM

## 2018-02-13 DIAGNOSIS — I2510 Atherosclerotic heart disease of native coronary artery without angina pectoris: Secondary | ICD-10-CM

## 2018-02-13 DIAGNOSIS — F411 Generalized anxiety disorder: Secondary | ICD-10-CM

## 2018-02-13 DIAGNOSIS — G8929 Other chronic pain: Secondary | ICD-10-CM | POA: Diagnosis not present

## 2018-02-13 DIAGNOSIS — D508 Other iron deficiency anemias: Secondary | ICD-10-CM

## 2018-02-13 DIAGNOSIS — M5010 Cervical disc disorder with radiculopathy, unspecified cervical region: Secondary | ICD-10-CM

## 2018-02-13 DIAGNOSIS — E291 Testicular hypofunction: Secondary | ICD-10-CM

## 2018-02-13 DIAGNOSIS — R5382 Chronic fatigue, unspecified: Secondary | ICD-10-CM

## 2018-02-13 LAB — T4, FREE: Free T4: 1.18 ng/dL (ref 0.60–1.60)

## 2018-02-13 LAB — SEDIMENTATION RATE: Sed Rate: 14 mm/hr (ref 0–20)

## 2018-02-13 LAB — TSH: TSH: 2.08 u[IU]/mL (ref 0.35–4.50)

## 2018-02-13 MED ORDER — FLUCONAZOLE 150 MG PO TABS: 150.0000 mg | ORAL_TABLET | Freq: Once | ORAL | 2 refills | Status: AC

## 2018-02-13 NOTE — Assessment & Plan Note (Signed)
In PT 

## 2018-02-13 NOTE — Assessment & Plan Note (Signed)
F/u w/Urol Had labs

## 2018-02-13 NOTE — Assessment & Plan Note (Signed)
Dr Arfeen Lorazepam prn  Potential benefits of a long term benzodiazepines  use as well as potential risks  and complications were explained to the patient and were aknowledged. 

## 2018-02-13 NOTE — Assessment & Plan Note (Signed)
  ASA, amlodipine, krill oil

## 2018-02-13 NOTE — Progress Notes (Signed)
Subjective:  Patient ID: Alex [name], male    DOB: Dec 21, 1955  Age: 62 y.o. MRN: [id number]  CC: No chief complaint on file.   HPI Eric Nees Italiano presents for fatigue, anxiety, ED f/u C/o L ankle swelling and pain - recurrent  Outpatient Medications Prior to Visit  Medication Sig Dispense Refill  . albuterol (PROAIR HFA) 108 (90 Base) MCG/ACT inhaler Inhale 2 puffs into the lungs every 4 (four) hours as needed for wheezing or shortness of breath. 1 Inhaler 6  . aspirin EC 81 MG tablet Take 1 tablet by mouth daily.    [name] atorvastatin (LIPITOR) 40 MG tablet Take 1 tablet (40 mg total) by mouth daily. (Patient taking differently: Take 40 mg by mouth at bedtime. ) 90 tablet 1  . AXIRON 30 MG/ACT SOLN Place 2 Act onto the skin daily.    . budesonide-formoterol (SYMBICORT) 80-4.5 MCG/ACT inhaler Inhale 2 puffs into the lungs 2 (two) times daily. (Patient taking differently: Inhale 2 puffs 2 (two) times daily into the lungs. ) 1 Inhaler 0  . Coenzyme Q10 (CO Q 10 PO) Take by mouth daily.    . Immune Globulin, Human, (HIZENTRA) 4 GM/20ML SOLN Inject into the skin once a week. wednesday    . LamoTRIgine 300 MG TB24 24 hour tablet Take 1 tablet (300 mg total) by mouth daily. 90 tablet 0  . latanoprost (XALATAN) 0.005 % ophthalmic solution 1 DROP EACH EYE AT NIGHT    . levalbuterol (XOPENEX HFA) 45 MCG/ACT inhaler Inhale 2 puffs into the lungs every 6 (six) hours as needed for wheezing. 1 Inhaler 5  . levothyroxine (SYNTHROID, LEVOTHROID) 150 MCG tablet TAKE 1 TABLET BY MOUTH EVERY DAY 90 tablet 3  . lidocaine-prilocaine (EMLA) cream as needed.    [name] LORazepam (ATIVAN) 0.5 MG tablet Take 1 tab daily as needed and 1 tab at bed time 60 tablet 2  . LUTEIN PO Take by mouth daily.    . meloxicam (MOBIC) 15 MG tablet TAKE 1 TABLET BY MOUTH EVERY DAY 30 tablet 2  . OMEGA-3 KRILL OIL PO Take by mouth daily.    [name] Respiratory Therapy Supplies (FLUTTER) DEVI Use as directed 1 each 0  . sildenafil  (VIAGRA) 25 MG tablet Take 5 mg by mouth daily as needed for erectile dysfunction (patient  states).    [name] Spacer/Aero-Holding Chambers (AEROCHAMBER MV) inhaler Use as instructed 1 each 0  . sucralfate (CARAFATE) 1 G tablet Take 1 g by mouth as needed.   3  . telmisartan (MICARDIS) 80 MG tablet TAKE 1 TABLET (80 MG TOTAL) BY MOUTH DAILY. 90 tablet 3  . zolpidem (AMBIEN) 10 MG tablet Take 1 tablet (10 mg total) by mouth at bedtime as needed. for sleep 30 tablet 2  . tadalafil (CIALIS) 20 MG tablet Take 1 tablet (20 mg total) by mouth daily as needed for erectile dysfunction. 30 tablet 5   No facility-administered medications prior to visit.     ROS: Review of Systems  Constitutional: Positive for fatigue. Negative for appetite change and unexpected weight change.  HENT: Negative for congestion, nosebleeds, sneezing, sore throat and trouble swallowing.   Eyes: Negative for itching and visual disturbance.  Respiratory: Negative for cough.   Cardiovascular: Negative for chest pain, palpitations and leg swelling.  Gastrointestinal: Negative for abdominal distention, blood in stool, diarrhea and nausea.  Genitourinary: Negative for frequency and hematuria.  Musculoskeletal: Positive for back pain, gait problem and joint swelling. Negative for neck pain.  Skin: Negative for rash.  Neurological: Negative for dizziness, tremors, speech difficulty and weakness.  Psychiatric/Behavioral: Positive for sleep disturbance. Negative for agitation, dysphoric mood and suicidal ideas. The patient is nervous/anxious.     Objective:  BP 120/88 (BP Location: Left Arm, Patient Position: Sitting, Cuff Size: Normal)   Pulse 85   Ht 6' (1.829 m)   Wt 183 lb (83 kg)   SpO2 98%   BMI 24.82 kg/m   BP Readings from Last 3 Encounters:  02/13/18 120/88  11/17/17 (!) 154/96  11/03/17 (!) 134/98    Wt Readings from Last 3 Encounters:  02/13/18 183 lb (83 kg)  11/17/17 179 lb (81.2 kg)  11/03/17 175 lb 2 oz  (79.4 kg)    Physical Exam  Constitutional: He is oriented to person, place, and time. He appears well-developed. No distress.  NAD  HENT:  Mouth/Throat: Oropharynx is clear and moist.  Eyes: Pupils are equal, round, and reactive to light. Conjunctivae are normal.  Neck: Normal range of motion. No JVD present. No thyromegaly present.  Cardiovascular: Normal rate, regular rhythm, normal heart sounds and intact distal pulses. Exam reveals no gallop and no friction rub.  No murmur heard. Pulmonary/Chest: Effort normal and breath sounds normal. No respiratory distress. He has no wheezes. He has no rales. He exhibits no tenderness.  Abdominal: Soft. Bowel sounds are normal. He exhibits no distension and no mass. There is no tenderness. There is no rebound and no guarding.  Musculoskeletal: Normal range of motion. He exhibits tenderness. He exhibits no edema.  Lymphadenopathy:    He has no cervical adenopathy.  Neurological: He is alert and oriented to person, place, and time. He has normal reflexes. No cranial nerve deficit. He exhibits normal muscle tone. He displays a negative Romberg sign. Coordination and gait normal.  Skin: Skin is warm and dry. No rash noted.  Psychiatric: He has a normal mood and affect. His behavior is normal. Judgment and thought content normal.  LS tender, neck tender w/ROM L ankle - swollen   Lab Results  Component Value Date   WBC 10.5 11/17/2017   HGB 17.0 11/17/2017   HCT 51.0 11/17/2017   PLT 254.0 11/17/2017   GLUCOSE 97 11/17/2017   CHOL 151 07/23/2016   TRIG 175 (H) 07/23/2016   HDL 74 07/23/2016   LDLDIRECT 59.0 02/02/2016   LDLCALC 42 07/23/2016   ALT 40 06/27/2017   AST 26 06/27/2017   NA 137 11/17/2017   K 4.1 11/17/2017   CL 100 11/17/2017   CREATININE 1.06 11/17/2017   BUN 17 11/17/2017   CO2 30 11/17/2017   TSH 6.25 (H) 11/17/2017   PSA 1.5 07/23/2016   INR 1.0 11/17/2017   HGBA1C 6.1 08/04/2012    US Abdominal Pelvic Art/vent Flow  Doppler  Result Date: 12/22/2017 CLINICAL DATA:  Follow-up liver vascular lesion. EXAM: DUPLEX ULTRASOUND OF LIVER; ULTRASOUND ABDOMEN LIMITED RIGHT UPPER QUADRANT TECHNIQUE: Color and duplex Doppler ultrasound was performed to evaluate the hepatic in-flow and out-flow vessels. COMPARISON:  Abdominal MR 06/22/2017 and abdominal CT from 03/25/2005 FINDINGS: Portal Vein Velocities Main:  27 cm/sec Right:  24 cm/sec Left:  16 cm/sec Hepatic Vein Velocities Right:  50 cm/sec Middle:  45 cm/sec Left:  31 cm/sec Hepatic Artery Velocity:  82 cm/sec Splenic Vein Velocity:  59 cm/sec Varices: Absent Ascites: Absent Vascular structures: Normal hepatopetal flow in the portal veins. Normal hepatofugal flow in the hepatic veins. Again noted is a vascular lesion in the right hepatic  lobe. This lesion is supplied by a portal vein and drains through a hepatic vein. This vascular lesion is primarily anechoic with a small septation. This vascular lesion measures 1.9 x 1.3 x 1.8 cm. Gallbladder: Normal appearance of the gallbladder without stones, sludge or wall thickening. The patient does not have a sonographic Murphy sign. Common bile duct: 0.3 cm Liver: There is a primarily anechoic structure in the left hepatic lobe with a septation. Findings are compatible with a cyst based on this finding and previous MR. This left hepatic cyst measures up to 1.4 cm. There are at least 2 additional cysts in the right hepatic lobe largest measuring up to 1.7 cm. Normal echotexture in liver. Spleen: Spleen is small for size measuring 5.4 cm in length. 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 The aneurysm measures up to 1.9 cm and minimally changed from the prior MRI study. Etiology for this portosystemic venous shunt and aneurysm formation is unknown. Question previous trauma to this area. Recommend surveillance of this portosystemic shunt and aneurysm if the patient is asymptomatic. No acute abnormality  in the right upper quadrant. Hepatic cysts. Electronically Signed   By: Markus Daft [name]   On: 12/22/2017 14:08   US Abdomen Limited Ruq  Result Date: 12/22/2017 CLINICAL DATA:  Follow-up liver vascular lesion. EXAM: DUPLEX ULTRASOUND OF LIVER; ULTRASOUND ABDOMEN LIMITED RIGHT UPPER QUADRANT TECHNIQUE: Color and duplex Doppler ultrasound was performed to evaluate the hepatic in-flow and out-flow vessels. COMPARISON:  Abdominal MR 06/22/2017 and abdominal CT from 03/25/2005 FINDINGS: Portal Vein Velocities Main:  27 cm/sec Right:  24 cm/sec Left:  16 cm/sec Hepatic Vein Velocities Right:  50 cm/sec Middle:  45 cm/sec Left:  31 cm/sec Hepatic Artery Velocity:  82 cm/sec Splenic Vein Velocity:  59 cm/sec Varices: Absent Ascites: Absent Vascular structures: Normal hepatopetal flow in the portal veins. Normal hepatofugal flow in the hepatic veins. Again noted is a vascular lesion in the right hepatic lobe. This lesion is supplied by a portal vein and drains through a hepatic vein. This vascular lesion is primarily anechoic with a small septation. This vascular lesion measures 1.9 x 1.3 x 1.8 cm. Gallbladder: Normal appearance of the gallbladder without stones, sludge or wall thickening. The patient does not have a sonographic Murphy sign. Common bile duct: 0.3 cm Liver: There is a primarily anechoic structure in the left hepatic lobe with a septation. Findings are compatible with a cyst based on this finding and previous MR. This left hepatic cyst measures up to 1.4 cm. There are at least 2 additional cysts in the right hepatic lobe largest measuring up to 1.7 cm. Normal echotexture in liver. Spleen: Spleen is small for size measuring 5.4 cm in length. 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 The aneurysm measures up to 1.9 cm and minimally changed from the prior MRI study. Etiology for this portosystemic venous shunt and aneurysm formation is unknown. Question previous trauma to  this area. Recommend surveillance of this portosystemic shunt and aneurysm if the patient is asymptomatic. No acute abnormality in the right upper quadrant. Hepatic cysts. Electronically Signed   By: Markus Daft [name]   On: 12/22/2017 14:08    Assessment & Plan:   There are no diagnoses linked to this encounter.   No orders of the defined types were placed in this encounter.    Follow-up: No follow-ups on file.  Walker Kehr, MD

## 2018-02-13 NOTE — Patient Instructions (Signed)
Good shoes Elastic sleeve for left ankle

## 2018-02-13 NOTE — Assessment & Plan Note (Signed)
Good shoes Elastic sleeve

## 2018-02-14 LAB — RHEUMATOID FACTOR: Rhuematoid fact SerPl-aCnc: <14 IU/mL (ref <14)

## 2018-02-16 ENCOUNTER — Other Ambulatory Visit: Payer: Self-pay | Admitting: Orthopedic Surgery

## 2018-02-16 DIAGNOSIS — S52124K Nondisplaced fracture of head of right radius, subsequent encounter for closed fracture with nonunion: Secondary | ICD-10-CM

## 2018-02-17 ENCOUNTER — Encounter: Payer: Self-pay | Admitting: Cardiology

## 2018-02-18 ENCOUNTER — Ambulatory Visit (HOSPITAL_COMMUNITY): Payer: Self-pay | Admitting: Licensed Clinical Social Worker

## 2018-02-19 ENCOUNTER — Encounter (HOSPITAL_COMMUNITY): Payer: Self-pay

## 2018-02-19 ENCOUNTER — Other Ambulatory Visit: Payer: Self-pay

## 2018-02-20 ENCOUNTER — Telehealth: Payer: Self-pay | Admitting: Cardiology

## 2018-02-20 ENCOUNTER — Other Ambulatory Visit: Payer: Self-pay | Admitting: *Deleted

## 2018-02-20 MED ORDER — AMLODIPINE BESYLATE 5 MG PO TABS: 5.0000 mg | ORAL_TABLET | Freq: Every day | ORAL | 3 refills | Status: DC

## 2018-02-20 MED ORDER — AMLODIPINE BESYLATE 5 MG PO TABS: 5.0000 mg | ORAL_TABLET | Freq: Every day | ORAL | 0 refills | Status: DC

## 2018-02-20 NOTE — Telephone Encounter (Signed)
Patient walked in to r/s appointment. He wanted to provide clarification on his amlodipine 5mg  that he resumed about 3 weeks ago and states he stopped hctz. He was notified that MD said OK to refill amlodipine. Rx(s) sent to pharmacy electronically.

## 2018-02-24 ENCOUNTER — Ambulatory Visit (HOSPITAL_COMMUNITY): Payer: Managed Care, Other (non HMO)

## 2018-02-24 ENCOUNTER — Ambulatory Visit (HOSPITAL_COMMUNITY): Payer: Managed Care, Other (non HMO) | Admitting: Anesthesiology

## 2018-02-24 ENCOUNTER — Ambulatory Visit (HOSPITAL_COMMUNITY): Payer: Self-pay | Admitting: Licensed Clinical Social Worker

## 2018-02-24 ENCOUNTER — Ambulatory Visit (HOSPITAL_COMMUNITY)
Admission: RE | Admit: 2018-02-24 | Discharge: 2018-02-24 | Disposition: A | Discharge status: Home / Self Care | Payer: Managed Care, Other (non HMO) | Source: Ambulatory Visit | Attending: Gastroenterology | Admitting: Gastroenterology

## 2018-02-24 ENCOUNTER — Encounter (HOSPITAL_COMMUNITY)
Admission: RE | Disposition: A | Discharge status: Home / Self Care | Payer: Self-pay | Source: Ambulatory Visit | Attending: Gastroenterology

## 2018-02-24 ENCOUNTER — Encounter (HOSPITAL_COMMUNITY): Payer: Self-pay | Admitting: Anesthesiology

## 2018-02-24 DIAGNOSIS — F319 Bipolar disorder, unspecified: Secondary | ICD-10-CM | POA: Insufficient documentation

## 2018-02-24 DIAGNOSIS — K219 Gastro-esophageal reflux disease without esophagitis: Secondary | ICD-10-CM | POA: Insufficient documentation

## 2018-02-24 DIAGNOSIS — K222 Esophageal obstruction: Secondary | ICD-10-CM | POA: Diagnosis present

## 2018-02-24 DIAGNOSIS — I2510 Atherosclerotic heart disease of native coronary artery without angina pectoris: Secondary | ICD-10-CM | POA: Insufficient documentation

## 2018-02-24 DIAGNOSIS — J449 Chronic obstructive pulmonary disease, unspecified: Secondary | ICD-10-CM | POA: Insufficient documentation

## 2018-02-24 DIAGNOSIS — K2289 Other specified disease of esophagus: Secondary | ICD-10-CM

## 2018-02-24 DIAGNOSIS — I10 Essential (primary) hypertension: Secondary | ICD-10-CM | POA: Insufficient documentation

## 2018-02-24 DIAGNOSIS — K228 Other specified diseases of esophagus: Secondary | ICD-10-CM

## 2018-02-24 DIAGNOSIS — Z79899 Other long term (current) drug therapy: Secondary | ICD-10-CM | POA: Diagnosis not present

## 2018-02-24 DIAGNOSIS — Z7982 Long term (current) use of aspirin: Secondary | ICD-10-CM | POA: Diagnosis not present

## 2018-02-24 DIAGNOSIS — E039 Hypothyroidism, unspecified: Secondary | ICD-10-CM | POA: Diagnosis not present

## 2018-02-24 DIAGNOSIS — Z8711 Personal history of peptic ulcer disease: Secondary | ICD-10-CM | POA: Insufficient documentation

## 2018-02-24 HISTORY — PX: SAVORY DILATION: SHX5439

## 2018-02-24 HISTORY — PX: ESOPHAGOGASTRODUODENOSCOPY (EGD) WITH PROPOFOL: SHX5813

## 2018-02-24 SURGERY — ESOPHAGOGASTRODUODENOSCOPY (EGD) WITH PROPOFOL
Anesthesia: Monitor Anesthesia Care

## 2018-02-24 MED ORDER — PROPOFOL 500 MG/50ML IV EMUL
INTRAVENOUS | Status: DC | PRN
  Administered 2018-02-24: 110 ug/kg/min via INTRAVENOUS

## 2018-02-24 MED ORDER — PROPOFOL 10 MG/ML IV BOLUS
INTRAVENOUS | Status: AC
  Filled 2018-02-24: qty 60

## 2018-02-24 MED ORDER — LACTATED RINGERS IV SOLN
INTRAVENOUS | Status: DC
  Administered 2018-02-24: 1000 mL via INTRAVENOUS

## 2018-02-24 MED ORDER — PROPOFOL 500 MG/50ML IV EMUL
INTRAVENOUS | Status: DC | PRN
  Administered 2018-02-24: 40 mg via INTRAVENOUS

## 2018-02-24 MED ORDER — LIDOCAINE HCL (CARDIAC) PF 100 MG/5ML IV SOSY
PREFILLED_SYRINGE | INTRAVENOUS | Status: DC | PRN
  Administered 2018-02-24: 80 mg via INTRATRACHEAL

## 2018-02-24 MED ORDER — ONDANSETRON HCL 4 MG/2ML IJ SOLN
INTRAMUSCULAR | Status: DC | PRN
  Administered 2018-02-24: 4 mg via INTRAVENOUS

## 2018-02-24 SURGICAL SUPPLY — 14 items

## 2018-02-24 NOTE — Anesthesia Postprocedure Evaluation (Signed)
Anesthesia Post Note  Patient: Alex [name]  Procedure(s) Performed: ESOPHAGOGASTRODUODENOSCOPY (EGD) WITH PROPOFOL (N/A ) SAVORY DILATION (N/A )     Patient location during evaluation: PACU Anesthesia Type: MAC Level of consciousness: awake and alert Pain management: pain level controlled Vital Signs Assessment: post-procedure vital signs reviewed and stable Respiratory status: spontaneous breathing Cardiovascular status: stable Anesthetic complications: no    Last Vitals:  Vitals:   02/24/18 0900 02/24/18 0910  BP: (!) 142/106 (!) 146/110  Pulse: 71 75  Resp: 18 (!) 23  Temp:    SpO2: 100% 97%    Last Pain:  Vitals:   02/24/18 0910  TempSrc:   PainSc: 0-No pain                 Nolon Nations

## 2018-02-24 NOTE — Progress Notes (Signed)
Pt BP elevated.  MD aware.  Instructed to take BP med upon discharge home.  Pt verbalize understanding to contact PCP if continues to be elevated after taking BP med

## 2018-02-24 NOTE — Transfer of Care (Signed)
Immediate Anesthesia Transfer of Care Note  Patient: Alex [name]  Procedure(s) Performed: Procedure(s): ESOPHAGOGASTRODUODENOSCOPY (EGD) WITH PROPOFOL (N/A) SAVORY DILATION (N/A)  Patient Location: PACU  Anesthesia Type:MAC  Level of Consciousness:  sedated, patient cooperative and responds to stimulation  Airway & Oxygen Therapy:Patient Spontanous Breathing and Patient connected to face mask oxgen  Post-op Assessment:  Report given to PACU RN and Post -op Vital signs reviewed and stable  Post vital signs:  Reviewed and stable  Last Vitals:  Vitals:   02/24/18 0737  BP: (!) 149/104  Pulse: 68  Resp: 12  Temp: 36.5 C  SpO2: 96%    Complications: No apparent anesthesia complications

## 2018-02-24 NOTE — Anesthesia Preprocedure Evaluation (Signed)
Anesthesia Evaluation  Patient identified by MRN, date of birth, ID band Patient awake    Reviewed: Allergy & Precautions, H&P , NPO status , Patient's Chart, lab work & pertinent test results, Unable to perform ROS - Chart review only  History of Anesthesia Complications Negative for: history of anesthetic complications  Airway Mallampati: II  TM Distance: >3 FB Neck ROM: Full    Dental no notable dental hx. (+) Teeth Intact, Dental Advisory Given,    Pulmonary pneumonia, COPD,  COPD inhaler,    Pulmonary exam normal breath sounds clear to auscultation       Cardiovascular hypertension, Pt. on medications + CAD and + Peripheral Vascular Disease  Normal cardiovascular exam Rhythm:Regular Rate:Normal  ECHO 12/12 Study Conclusions  Left ventricle: The cavity size was normal. Wall thickness was normal. Systolic function was normal. The estimated ejection fraction was in the range of 55% to 60%. Wall motion was normal; there were no regional wall motion abnormalities. Doppler parameters are consistent with abnormal left ventricular relaxation (grade 1 diastolic dysfunction).   Neuro/Psych PSYCHIATRIC DISORDERS Anxiety Depression Bipolar Disorder negative neurological ROS     GI/Hepatic Neg liver ROS, hiatal hernia, PUD, GERD  Medicated,  Endo/Other  negative endocrine ROSHypothyroidism   Renal/GU Renal disease     Musculoskeletal  (+) Arthritis ,   Abdominal   Peds  Hematology negative hematology ROS (+)   Anesthesia Other Findings   Reproductive/Obstetrics                             Anesthesia Physical  Anesthesia Plan  ASA: III  Anesthesia Plan: MAC   Post-op Pain Management:    Induction: Intravenous  PONV Risk Score and Plan: 1 and Ondansetron and Treatment may vary due to age or medical condition  Airway Management Planned: Natural Airway, Nasal Cannula and Simple Face  Mask  Additional Equipment:   Intra-op Plan:   Post-operative Plan:   Informed Consent: I have reviewed the patients History and Physical, chart, labs and discussed the procedure including the risks, benefits and alternatives for the proposed anesthesia with the patient or authorized representative who has indicated his/her understanding and acceptance.   Dental advisory given  Plan Discussed with: CRNA  Anesthesia Plan Comments:         Anesthesia Quick Evaluation

## 2018-02-24 NOTE — H&P (Signed)
Subjective:   Patient is a 62 y.o. male presents with dysphagia. Procedure including risks and benefits discussed in office.  Patient Active Problem List   Diagnosis Date Noted  . Bruising 11/17/2017  . Testicular pain, right 08/01/2017  . Fatigue 01/21/2017  . Ankle pain, left 01/21/2017  . Fall (on) (from) other stairs and steps, initial encounter 11/11/2016  . Abrasion of head 11/11/2016  . Thoracic aortic aneurysm ([place]) 07/23/2016  . Erectile dysfunction 07/19/2016  . Bronchiectasis ([place]) 04/23/2016  . Chronic bronchitis ([place]) 03/11/2016  . Acute URI 11/03/2015  . Cerumen impaction 08/04/2015  . Cervical disc disorder with radiculopathy of cervical region 06/06/2014  . Right elbow pain 06/06/2014  . Elevated WBC count 06/06/2014  . Iron deficiency anemia 06/06/2014  . Pre-operative exam 04/07/2014  . Chronic right SI joint pain 02/21/2014  . Edema 02/21/2014  . Tinnitus 02/21/2014  . Well adult exam 05/31/2013  . Glaucoma 05/31/2013  . RML pneumonia ([place]) 03/31/2013  . Hypertension, uncontrolled 02/25/2013  . Ingrowing toenail with infection 04/02/2012  . Localized osteoarthritis of left knee 04/02/2012  . Vertigo 01/05/2012  . Ataxia 01/05/2012  . Anxiety state 08/08/2010  . ESOPHAGEAL STRICTURE 11/30/2009  . CHANGE IN BOWELS 11/30/2009  . Major depression, chronic 08/07/2009  . Osteoarthritis 08/07/2009  . GENERALIZED OSTEOARTHROSIS UNSPECIFIED SITE 10/20/2008  . Dyslipidemia 10/19/2008  . FOOT PAIN 07/11/2008  . Orchitis and epididymitis 05/24/2008  . RASH-NONVESICULAR 05/14/2008  . Cough 03/29/2008  . Coronary atherosclerosis 11/04/2007  . Lumbago 11/04/2007  . THYROIDECTOMY, HX OF 11/04/2007  . 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 NEOPLASM, MALIGNANT, THYROID GLAND 10/02/2007  . 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 IMPAIRED GLUCOSE TOLERANCE 10/02/2007   Past Medical History:  Diagnosis Date   . Anemia   . Anxiety   . Bipolar I disorder ([place])   . Bleeding ulcer 06/2014  . Bleeding ulcer 2016  . BPH (benign prostatic hypertrophy) with urinary obstruction   . Cataract    LEFT EYE  . Chronic back pain   . Complication of anesthesia POST URINARY RETENTION---  2006 SHOULDER SURGERY MARKED BRADYCARDIA VAGAL RESPONSE NO ISSUE W/ SURGERY AFTER THIS ONE   WITH GENERAL ANESTHESIA, 15 YRS AGO VASOVAGAL REACTION NONE SINCE  . Coronary atherosclerosis CARDIOLOGIST- DR CRENSHAW--  LAST VISIT 01-05-2012 IN EPIC   NON-OBSTRUCTIVE MILD DISEASE  . 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[place])   . Depression   . Epicondylitis    right elbow  . GERD (gastroesophageal reflux disease)   . Glaucoma BOTH EYES   RIGHT EYE RADIATION DAMAGE  . Hearing loss    BOTH EARS  . History of chronic prostatitis   . History of hiatal hernia    SMALL  . History of kidney stones   . 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 History of thyroid cancer PRIMARY (NO METS FROM ORBITAL CANCER)--   IN REMISSION   S/P TOTAL THYROIDECTOMY  , CHEMORADIATION  (ONCOLOGIST -- DR Griffith Citron)  . Hyperlipidemia   . Hypertension   . Nocturia   . OA (osteoarthritis)   . Peripheral vascular disease ([facility])    THORACIC AA 3. 9 CM X 4. 3 CM PER NOV 06-14-17  CHEST CTFOLOWED BY DR Stanford Breed  YEARLY FOR  . Positional vertigo    HX OF WITH SINUS INFECTIONS  . Tinnitus    CONSTANT  . Ulnar nerve compression    right elbow  . Urinary hesitancy     Past Surgical History:  Procedure Laterality Date  . CARDIAC CATHETERIZATION  01-16-2006  DR Marcello Moores WALL   MILD CORONARY ATHEROSCLEROSIS/ MID TO DISTAL LAD 40% STENOSIS/ LVF 50-55%  . CARPAL TUNNEL RELEASE Right 11/03/2017   Procedure: RIGHT HAND CARPAL TUNNEL RELEASE;  Surgeon: Iran Planas, MD;  Location: [facility];  Service: Orthopedics;  Laterality: Right;  . COLONSCOPY  2017 LAST DONE   MULTIPLE  . ENDOSCOPY  LAST 2017   MULTIPLE DONE DILATION DONE ALSO  . EXCISION RADIAL HEAD Right 11/03/2017   Procedure: RIGHT PROXIMAL RADIUS RADIAL HEAD RESECTION AND JOINT DEBRIDEMENT;  Surgeon: Iran Planas, MD;  Location: [place];  Service: Orthopedics;  Laterality: Right;  . KNEE ARTHROSCOPY  05/01/2012   Procedure: ARTHROSCOPY KNEE;  Surgeon: Johnn Hai, MD;  Location: [facility];  Service: Orthopedics;  Laterality: Left;  debridement and removal of loose body  . LEFT ANKLE ARTHROSCOPY W/ DEBRIDEMENT  05-12-2007  . LEFT HYDROCELECTOMY  03-29-2005   AND REPAIR LEFT INGUINAL HERNIA W/ MESH  . MOHS SURGERY  2002   RIGHT ORBITAL CANCER  . NASAL ENDOSCOPY  08-07-2005   RIGHT EPISTAXIS  / POST SEPTOPLASTY  (HX RIGHT ORBITAL CA & S/P RADIATION/ NECROSIS ANTERIOR END OF BOTH INFERIOR TURBINATES)  . occuloplastic surgery  2002  . PARS PLANA VITRECTOMY  11-06-2004   RIGHT EYE RADIATION RETINOPATHY W/ HEMORRHAGE  . REPAIR UNDESENDED RIGHT TESTICLE / RIGHT INGUINAL HERNIA  AGE 19  . RIGHT ANKLE ARTHROSCOPY W/ EXTENSIVE DEBRIDEMENT  04-05-2008   . RIGHT SHOULDER SURGERY  2006  . RIGHT SUPRAOMOHYOID NECK DISSECTION   03-08-2003   ZONES 1,2,3;   SUBMANDIBULAR MASS / METASTATIC SQUAMOUS CELL CARCINOMA RIGHT NECK  . SEPTOPLASTY  NOV 2006  . SHOULDER ARTHROSCOPY W/ SUBACROMIAL DECOMPRESSION AND DISTAL CLAVICLE EXCISION  10-09-2008   AND DEBRIDEMENT OF RIGHT SHOULDER IMPINGEMENT & [facility] JOINT ARTHRITIS  . SPINE SURGERY  2016   l 3 TO l 4 PLATE AND SCREWS  . TOTAL THYROIDECTOMY  11-03-2001   PAPILLARY THYROID CARCINOMA  . TRANSTHORACIC ECHOCARDIOGRAM  12/ 2012   grade I diastolic dysfunction/ ef 84-13%  . ULNAR NERVE TRANSPOSITION Right 04/28/2014   Procedure: RIGHT ELBOW ULNA NERVE RELEASE TRANSPOSTION AND MEDIAL EPICONDYLAR DEBRIDEMENT AND REPAIR;  Surgeon: Linna Hoff, MD;  Location: [place];  Service: Orthopedics;  Laterality: Right;    Medications Prior to Admission  Medication Sig Dispense Refill Last Dose  . amLODipine (NORVASC) 5 MG tablet Take 1 tablet (5 mg total) by mouth daily. 90 tablet 3 02/23/2018 at Unknown time  . aspirin EC 81 MG tablet Take 1 tablet by mouth daily.   02/23/2018 at Unknown time  . atorvastatin (LIPITOR) 40 MG tablet Take 1 tablet (40 mg total) by mouth daily. (Patient taking differently: Take 40 mg by mouth at bedtime. ) 90 tablet 1 02/23/2018 at Unknown time  . AXIRON 30 MG/ACT SOLN Place 2 Act onto the skin daily.   02/23/2018 at Unknown time  . budesonide-formoterol (SYMBICORT) 80-4.5 MCG/ACT inhaler Inhale 2 puffs into the lungs 2 (two) times daily. (Patient taking differently: Inhale 2 puffs into the lungs 2 (two) times daily as needed (Bronchitis). ) 1 Inhaler 0 Past Month at  Unknown time  . Coenzyme Q10 (CO Q 10 PO) Take 1 capsule by mouth daily.    02/23/2018 at Unknown time  . Immune Globulin, Human, (HIZENTRA) 4 GM/20ML SOLN Inject into the skin once a week. wednesday   Past Week at Unknown time  . LamoTRIgine 300 MG TB24 24 hour tablet Take 1 tablet (300 mg total) by mouth daily. 90 tablet 0 02/23/2018 at Unknown time  . latanoprost (XALATAN) 0.005 % ophthalmic solution 1 DROP EACH EYE AT NIGHT   Taking  . levalbuterol (XOPENEX HFA) 45 MCG/ACT inhaler Inhale 2 puffs into the lungs every 6 (six) hours as needed for wheezing. 1 Inhaler 5 Past Month at Unknown time  . levothyroxine (SYNTHROID, LEVOTHROID) 150 MCG tablet TAKE 1 TABLET BY MOUTH EVERY DAY (Patient taking differently: TAKE 1 TABLET BY MOUTH EVERY DAY AT BEDTIME) 90 tablet 3 02/23/2018 at Unknown time  . LORazepam (ATIVAN) 0.5 MG tablet Take 1 tab daily as needed and 1 tab at bed time (Patient taking differently: Take 0.5 mg by mouth See admin instructions. Take 1 tab daily as needed and 1 tab at bed time) 60 tablet 2 02/23/2018 at Unknown time   . LUTEIN PO Take 1 tablet by mouth daily.    02/23/2018 at Unknown time  . meloxicam (MOBIC) 15 MG tablet TAKE 1 TABLET BY MOUTH EVERY DAY (Patient taking differently: TAKE 1 TABLET BY MOUTH EVERY DAY AS NEEDED) 30 tablet 2 Past Month at Unknown time  . OMEGA-3 KRILL OIL PO Take 1 capsule by mouth daily.    02/23/2018 at Unknown time  . Spacer/Aero-Holding Chambers (AEROCHAMBER MV) inhaler Use as instructed 1 each 0 Past Week at Unknown time  . sucralfate (CARAFATE) 1 G tablet Take 1 g by mouth daily as needed (ACID REFLUX).   3 02/23/2018 at Unknown time  . telmisartan (MICARDIS) 80 MG tablet TAKE 1 TABLET (80 MG TOTAL) BY MOUTH DAILY. 90 tablet 3 02/23/2018 at Unknown time  . zolpidem (AMBIEN) 10 MG tablet Take 1 tablet (10 mg total) by mouth at bedtime as needed. for sleep (Patient taking differently: Take 10 mg by mouth at bedtime. for sleep) 30 tablet 2 02/23/2018 at Unknown time  . lidocaine-prilocaine (EMLA) cream Apply 1 application topically as needed.    Unknown at Unknown time  . Respiratory Therapy Supplies (FLUTTER) DEVI Use as directed 1 each 0 Taking  . sildenafil (VIAGRA) 25 MG tablet Take 5 mg by mouth daily as needed (BPH).    Unknown at Unknown time   Allergies  Allergen Reactions  . Percocet [Oxycodone-Acetaminophen] Itching    Can take generic.  States only has a problem with percocet brand, also headache  . Vicodin [Hydrocodone-Acetaminophen]     Itching and sleep disruption, can take generic    Social History   Tobacco Use  . Smoking status: Never Smoker  . Smokeless tobacco: Never Used  Substance Use Topics  . Alcohol use: Yes    Alcohol/week: 0.0 oz    Comment: 1-2 glasses per month    Family History  Problem Relation Age of Onset  . Depression Sister   . Rectal cancer Sister   . Lung cancer Brother   . Kidney disease Mother   . Depression Mother   . Stroke Father   . COPD Father   . Hypertension Father   . Prostate cancer Father   . Depression Daughter    . Drug abuse Daughter   . Psychiatric Illness Son  Objective:   Patient Vitals for the past 8 hrs:  BP Temp Temp src Pulse Resp SpO2 Height Weight  02/24/18 0737 (!) 149/104 97.7 F (36.5 C) Oral 68 12 95 % 6' (1.829 m) 83 kg (183 lb)   No intake/output data recorded. No intake/output data recorded.   See MD Preop evaluation      Assessment:   1. Dysphagia  Plan:   1. Proceed with dilatation. Discussed in office

## 2018-02-24 NOTE — Op Note (Signed)
[facility] Patient Name: Alex [name] Procedure Date: 02/24/2018 MRN: [id number] Attending MD: Tresea Mall Dr., MD Date of Birth: [birth year] CSN: [id number] Age: 62 Admit Type: Outpatient Procedure:                Upper GI endoscopy with Savary dilatation using                            fluoroscopic guidance Indications:              Dysphagia, Esophageal reflux, patient has had                            dilatation's in the past with some improvement Providers:                Fayrene Fearing L. Dolan Xia Dr., MD, Jacquiline Doe, RN, Madalyn Rob, Technician, Greig Right, CRNA Referring MD:             [name] MD, MD Medicines:                Monitored Anesthesia Care Complications:            No immediate complications. Estimated Blood Loss:     Estimated blood loss: none. Procedure:                Pre-Anesthesia Assessment:                           - Prior to the procedure, a History and Physical                            was performed, and patient medications and                            allergies were reviewed. The patient's tolerance of                            previous anesthesia was also reviewed. The risks                            and benefits of the procedure and the sedation                            options and risks were discussed with the patient.                            All questions were answered, and informed consent                            was obtained. Prior Anticoagulants: The patient has                            taken no previous anticoagulant or antiplatelet  agents. ASA Grade Assessment: III - A patient with                            severe systemic disease. After reviewing the risks                            and benefits, the patient was deemed in                            satisfactory condition to undergo the procedure.                           After obtaining  informed consent, the endoscope was                            passed under direct vision. Throughout the                            procedure, the patient's blood pressure, pulse, and                            oxygen saturations were monitored continuously. The                            EG-[id number] ([id number]) scope was introduced through the                            mouth, and advanced to the second part of duodenum.                            The upper GI endoscopy was accomplished without                            difficulty. The patient tolerated the procedure                            well. Scope In: Scope Out: Findings:      A few benign-appearing, intrinsic mild stenoses were found. The stenoses       were traversed. A guidewire was placed under fluoroscopic guidance and       the scope was withdrawn. Dilation was performed with a Savary dilator       with no resistance at 12.8 mm. 14 mm dilator passed. No blood on either       of these dilators. 15 mm dilator passed with a small amount of blood in       the dilator and guide wire were removed. Fluoroscopic guidance was used       for all dilation's.      The stomach was normal.      The examined duodenum was normal. Impression:               - Benign-appearing esophageal stenoses. Dilated.                           - Normal stomach.                           -  Normal examined duodenum.                           - No specimens collected. Moderate Sedation:      See anesthesia note, no moderate sedation. Recommendation:           - Patient has a contact number available for                            emergencies. The signs and symptoms of potential                            delayed complications were discussed with the                            patient. Return to normal activities tomorrow.                            Written discharge instructions were provided to the                            patient.                            - Clear liquid diet today.                           - Continue present medications.                           - Return to endoscopist in 6 weeks. Procedure Code(s):        --- Professional ---                           [phone], Esophagogastroduodenoscopy, flexible,                            transoral; with insertion of guide wire followed by                            passage of dilator(s) through esophagus over guide                            wire Diagnosis Code(s):        --- Professional ---                           K22.2, Esophageal obstruction                           R13.10, Dysphagia, unspecified                           K21.9, Gastro-esophageal reflux disease without                            esophagitis CPT copyright 2017 American Medical Association. All rights reserved. The codes  documented in this report are preliminary and upon coder review may  be revised to meet current compliance requirements. Tresea Mall Dr., MD 02/24/2018 8:56:56 AM This report has been signed electronically. Number of Addenda: 0

## 2018-02-24 NOTE — Discharge Instructions (Signed)
YOU HAD AN ENDOSCOPIC PROCEDURE TODAY: Refer to the procedure report and other information in the discharge instructions given to you for any specific questions about what was found during the examination. If this information does not answer your questions, please call [facility] office at [phone] to clarify.   YOU SHOULD EXPECT: Some feelings of bloating in the abdomen. Passage of more gas than usual. Walking can help get rid of the air that was put into your GI tract during the procedure and reduce the bloating. If you had a lower endoscopy (such as a colonoscopy or flexible sigmoidoscopy) you may notice spotting of blood in your stool or on the toilet paper. Some abdominal soreness may be present for a day or two, also.  DIET: Your first meal following the procedure should be a light meal and then it is ok to progress to your normal diet. A half-sandwich or bowl of soup is an example of a good first meal. Heavy or fried foods are harder to digest and may make you feel nauseous or bloated. Drink plenty of fluids but you should avoid alcoholic beverages for 24 hours. If you had a esophageal dilation, please see attached instructions for diet.   ACTIVITY: Your care partner should take you home directly after the procedure. You should plan to take it easy, moving slowly for the rest of the day. You can resume normal activity the day after the procedure however YOU SHOULD NOT DRIVE, use power tools, machinery or perform tasks that involve climbing or major physical exertion for 24 hours (because of the sedation medicines used during the test).   SYMPTOMS TO REPORT IMMEDIATELY: A gastroenterologist can be reached at any hour. Please call [phone]  for any of the following symptoms:  Following lower endoscopy (colonoscopy, flexible sigmoidoscopy) Excessive amounts of blood in the stool  Significant tenderness, worsening of abdominal pains  Swelling of the abdomen that is new, acute  Fever of 100 or  higher  Following upper endoscopy (EGD, EUS, ERCP, esophageal dilation) Vomiting of blood or coffee ground material  New, significant abdominal pain  New, significant chest pain or pain under the shoulder blades  Painful or persistently difficult swallowing  New shortness of breath  Black, tarry-looking or red, bloody stools  FOLLOW UP:  If any biopsies were taken you will be contacted by phone or by letter within the next 1-3 weeks. Call [phone]  if you have not heard about the biopsies in 3 weeks.  Please also call with any specific questions about appointments or follow up tests.  Continue current meds. Clear liquids for 4-6 hours, if no chest pain or trouble breathing, soft foods tonight.  Regular diet tomorrow. Call for problems.

## 2018-02-25 ENCOUNTER — Telehealth: Payer: Self-pay | Admitting: Cardiology

## 2018-02-25 NOTE — Telephone Encounter (Signed)
Received records from [place] on 02/25/18, Appt 05/15/18 @ 1:20PM.[place]

## 2018-02-26 ENCOUNTER — Ambulatory Visit
Admission: RE | Admit: 2018-02-26 | Discharge: 2018-02-26 | Disposition: A | Discharge status: Home / Self Care | Payer: Managed Care, Other (non HMO) | Source: Ambulatory Visit | Attending: Orthopedic Surgery | Admitting: Orthopedic Surgery

## 2018-02-26 ENCOUNTER — Other Ambulatory Visit: Payer: Self-pay | Admitting: Orthopedic Surgery

## 2018-02-26 DIAGNOSIS — S52124D Nondisplaced fracture of head of right radius, subsequent encounter for closed fracture with routine healing: Secondary | ICD-10-CM

## 2018-02-26 DIAGNOSIS — S52124K Nondisplaced fracture of head of right radius, subsequent encounter for closed fracture with nonunion: Secondary | ICD-10-CM

## 2018-03-03 ENCOUNTER — Other Ambulatory Visit: Payer: Self-pay | Admitting: Internal Medicine

## 2018-03-05 ENCOUNTER — Other Ambulatory Visit: Payer: Self-pay | Admitting: *Deleted

## 2018-03-09 ENCOUNTER — Encounter

## 2018-03-09 ENCOUNTER — Telehealth: Payer: Self-pay | Admitting: *Deleted

## 2018-03-09 ENCOUNTER — Ambulatory Visit (INDEPENDENT_AMBULATORY_CARE_PROVIDER_SITE_OTHER): Payer: 59 | Admitting: Licensed Clinical Social Worker

## 2018-03-09 ENCOUNTER — Encounter (HOSPITAL_COMMUNITY): Payer: Self-pay | Admitting: Licensed Clinical Social Worker

## 2018-03-09 ENCOUNTER — Other Ambulatory Visit: Payer: Self-pay | Admitting: *Deleted

## 2018-03-09 DIAGNOSIS — R1310 Dysphagia, unspecified: Secondary | ICD-10-CM

## 2018-03-09 DIAGNOSIS — C73 Malignant neoplasm of thyroid gland: Secondary | ICD-10-CM

## 2018-03-09 DIAGNOSIS — F319 Bipolar disorder, unspecified: Secondary | ICD-10-CM

## 2018-03-09 DIAGNOSIS — M5010 Cervical disc disorder with radiculopathy, unspecified cervical region: Secondary | ICD-10-CM

## 2018-03-09 DIAGNOSIS — R059 Cough, unspecified: Secondary | ICD-10-CM

## 2018-03-09 DIAGNOSIS — C6960 Malignant neoplasm of unspecified orbit: Secondary | ICD-10-CM

## 2018-03-09 DIAGNOSIS — R05 Cough: Secondary | ICD-10-CM

## 2018-03-09 DIAGNOSIS — K222 Esophageal obstruction: Secondary | ICD-10-CM

## 2018-03-09 NOTE — Telephone Encounter (Signed)
This RN contacted [facility] per denial of MRI and CT scan ordered and scheduled for this Thursday.  Per communication with Tiffany RN case reviewer - " case has been completely closed at this time "  Per above and this RN inquiry per proceeding with peer to peer or entering new order with corrected diagnosis and reason for exam- this RN entered new orders.  Note pt is scheduled for scans this Thursday 8/1 at [facility].

## 2018-03-09 NOTE — Progress Notes (Signed)
Therapist Progress Note   Time: 2:10-3pm  Participation Level: Active  Behavioral Response: Casual/Alert/ Anxious  Type of Therapy: Individual Therapy  Treatment Goals Addressed: Coping  Interventions: Supportive/ CBT  Summary: Pt presented for individual therapy. Pt discussed his psychiatric symptoms and current life events. Pt discussed his psychiatric symptoms and reports his moods are stable. However, he suffers with his medical well being. He has visited his medical professionals and had tests and all tests normal. Pt continues to discuss his biggest stressor: his family and $.  Asked open ended questions and empathic reflection.  His son with mental illness still struggles and has put more items in his stomach and lungs. Continue to discuss the impact his son has on the family. Pt and his wife are uneven on their decisions on their children. Encouraged pt to continue to have discussions with his wife to become unison is decisions on their children. Pt continues in the Bible study with a diverse group of men. Encouraged pt to ask for help from these man and let them be part of his support system.   Suicidal/Homicidal: No/ without plan  Therapist Response: Assessed pt's current functioning and reviewed progress. Assisted pt processing family, support group, medical issues. Assisted pt  processing for management of stressors.  Plan: Return in 4 weeks using CBT  Diagnosis: Axis 1: Bipolar 1 disorder,   [name], LCAS 03/10/18

## 2018-03-11 ENCOUNTER — Telehealth: Payer: Self-pay | Admitting: *Deleted

## 2018-03-11 NOTE — Telephone Encounter (Signed)
Faxed [facility] to [place]; release [phone]

## 2018-03-12 ENCOUNTER — Other Ambulatory Visit: Payer: Self-pay

## 2018-03-20 ENCOUNTER — Other Ambulatory Visit: Payer: Self-pay | Admitting: Internal Medicine

## 2018-03-23 ENCOUNTER — Ambulatory Visit: Payer: Self-pay | Admitting: Cardiology

## 2018-03-24 ENCOUNTER — Ambulatory Visit: Payer: Self-pay | Admitting: Cardiology

## 2018-03-24 ENCOUNTER — Ambulatory Visit (HOSPITAL_COMMUNITY): Payer: Self-pay | Admitting: Licensed Clinical Social Worker

## 2018-03-28 ENCOUNTER — Other Ambulatory Visit (HOSPITAL_COMMUNITY): Payer: Self-pay | Admitting: Psychiatry

## 2018-03-28 DIAGNOSIS — F319 Bipolar disorder, unspecified: Secondary | ICD-10-CM

## 2018-03-28 DIAGNOSIS — F3162 Bipolar disorder, current episode mixed, moderate: Secondary | ICD-10-CM

## 2018-03-30 ENCOUNTER — Other Ambulatory Visit: Payer: Self-pay | Admitting: *Deleted

## 2018-03-30 ENCOUNTER — Ambulatory Visit: Payer: Self-pay | Admitting: Pulmonary Disease

## 2018-03-30 ENCOUNTER — Telehealth: Payer: Self-pay | Admitting: *Deleted

## 2018-03-30 ENCOUNTER — Ambulatory Visit (HOSPITAL_COMMUNITY): Payer: Self-pay | Admitting: Psychiatry

## 2018-03-30 NOTE — Telephone Encounter (Signed)
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

## 2018-04-01 ENCOUNTER — Ambulatory Visit (INDEPENDENT_AMBULATORY_CARE_PROVIDER_SITE_OTHER): Payer: Managed Care, Other (non HMO) | Admitting: Internal Medicine

## 2018-04-01 ENCOUNTER — Telehealth: Payer: Self-pay | Admitting: Internal Medicine

## 2018-04-01 ENCOUNTER — Encounter: Payer: Self-pay | Admitting: Internal Medicine

## 2018-04-01 VITALS — BP 148/86 | HR 82 | Temp 98.2°F | Ht 72.0 in | Wt 181.0 lb

## 2018-04-01 DIAGNOSIS — D508 Other iron deficiency anemias: Secondary | ICD-10-CM

## 2018-04-01 DIAGNOSIS — R5382 Chronic fatigue, unspecified: Secondary | ICD-10-CM

## 2018-04-01 DIAGNOSIS — F411 Generalized anxiety disorder: Secondary | ICD-10-CM

## 2018-04-01 DIAGNOSIS — I2510 Atherosclerotic heart disease of native coronary artery without angina pectoris: Secondary | ICD-10-CM

## 2018-04-01 DIAGNOSIS — F329 Major depressive disorder, single episode, unspecified: Secondary | ICD-10-CM

## 2018-04-01 DIAGNOSIS — Z23 Encounter for immunization: Secondary | ICD-10-CM | POA: Diagnosis not present

## 2018-04-01 NOTE — Assessment & Plan Note (Signed)
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

## 2018-04-01 NOTE — Assessment & Plan Note (Signed)
No angina 

## 2018-04-01 NOTE — Assessment & Plan Note (Signed)
F/u w/Dr Magrinat 

## 2018-04-01 NOTE — Progress Notes (Signed)
Subjective:  Patient ID: Alex [name], male    DOB: 1956/06/25  Age: 62 y.o. MRN: [id number]  CC: No chief complaint on file.   HPI Alex [name] presents for HTN, asthma, hypothyroidism f/u C/o stress w/family Dr Adele Schilder is on the sick leave - low on the Lorazepam  Outpatient Medications Prior to Visit  Medication Sig Dispense Refill  . amLODipine (NORVASC) 5 MG tablet Take 1 tablet (5 mg total) by mouth daily. 90 tablet 3  . aspirin EC 81 MG tablet Take 1 tablet by mouth daily.    [name] atorvastatin (LIPITOR) 40 MG tablet Take 1 tablet (40 mg total) by mouth at bedtime. 90 tablet 1  . AXIRON 30 MG/ACT SOLN Place 2 Act onto the skin daily.    . budesonide-formoterol (SYMBICORT) 80-4.5 MCG/ACT inhaler Inhale 2 puffs into the lungs 2 (two) times daily. (Patient taking differently: Inhale 2 puffs into the lungs 2 (two) times daily as needed (Bronchitis). ) 1 Inhaler 0  . Coenzyme Q10 (CO Q 10 PO) Take 1 capsule by mouth daily.     . Immune Globulin, Human, (HIZENTRA) 4 GM/20ML SOLN Inject into the skin once a week. wednesday    . LamoTRIgine 300 MG TB24 24 hour tablet Take 1 tablet (300 mg total) by mouth daily. 90 tablet 0  . latanoprost (XALATAN) 0.005 % ophthalmic solution 1 DROP EACH EYE AT NIGHT    . levalbuterol (XOPENEX HFA) 45 MCG/ACT inhaler Inhale 2 puffs into the lungs every 6 (six) hours as needed for wheezing. 1 Inhaler 5  . levothyroxine (SYNTHROID, LEVOTHROID) 150 MCG tablet TAKE 1 TABLET BY MOUTH EVERY DAY (Patient taking differently: TAKE 1 TABLET BY MOUTH EVERY DAY AT BEDTIME) 90 tablet 3  . lidocaine-prilocaine (EMLA) cream Apply 1 application topically as needed.     [name] LORazepam (ATIVAN) 0.5 MG tablet Take 1 tab daily as needed and 1 tab at bed time (Patient taking differently: Take 0.5 mg by mouth See admin instructions. Take 1 tab daily as needed and 1 tab at bed time) 60 tablet 2  . LUTEIN PO Take 1 tablet by mouth daily.     . meloxicam (MOBIC) 15 MG tablet Take 1  tablet (15 mg total) by mouth daily as needed. 30 tablet 2  . OMEGA-3 KRILL OIL PO Take 1 capsule by mouth daily.     [name] Respiratory Therapy Supplies (FLUTTER) DEVI Use as directed 1 each 0  . sildenafil (VIAGRA) 25 MG tablet Take 5 mg by mouth daily as needed (BPH).     [name] Spacer/Aero-Holding Chambers (AEROCHAMBER MV) inhaler Use as instructed 1 each 0  . sucralfate (CARAFATE) 1 G tablet Take 1 g by mouth daily as needed (ACID REFLUX).   3  . telmisartan (MICARDIS) 80 MG tablet TAKE 1 TABLET (80 MG TOTAL) BY MOUTH DAILY. 90 tablet 3  . zolpidem (AMBIEN) 10 MG tablet Take 1 tablet (10 mg total) by mouth at bedtime as needed. for sleep (Patient taking differently: Take 10 mg by mouth at bedtime. for sleep) 30 tablet 2   No facility-administered medications prior to visit.     ROS: Review of Systems  Constitutional: Positive for fatigue. Negative for appetite change and unexpected weight change.  HENT: Negative for congestion, nosebleeds, sneezing, sore throat and trouble swallowing.   Eyes: Negative for itching and visual disturbance.  Respiratory: Negative for cough.   Cardiovascular: Negative for chest pain, palpitations and leg swelling.  Gastrointestinal: Negative for abdominal distention,  blood in stool, diarrhea and nausea.  Genitourinary: Negative for frequency and hematuria.  Musculoskeletal: Negative for back pain, gait problem, joint swelling and neck pain.  Skin: Negative for rash.  Neurological: Negative for dizziness, tremors, speech difficulty and weakness.  Psychiatric/Behavioral: Positive for dysphoric mood. Negative for agitation, sleep disturbance and suicidal ideas. The patient is nervous/anxious.     Objective:  BP (!) 148/86 (BP Location: Left Arm, Patient Position: Sitting, Cuff Size: Normal)   Pulse 82   Temp 98.2 F (36.8 C) (Oral)   Ht 6' (1.829 m)   Wt 181 lb (82.1 kg)   SpO2 98%   BMI 24.55 kg/m   BP Readings from Last 3 Encounters:  04/01/18 (!) 148/86    02/24/18 (!) 146/110  02/13/18 120/88    Wt Readings from Last 3 Encounters:  04/01/18 181 lb (82.1 kg)  02/24/18 183 lb (83 kg)  02/13/18 183 lb (83 kg)    Physical Exam  Constitutional: He is oriented to person, place, and time. He appears well-developed. No distress.  NAD  HENT:  Mouth/Throat: Oropharynx is clear and moist.  Eyes: Pupils are equal, round, and reactive to light. Conjunctivae are normal.  Neck: Normal range of motion. No JVD present. No thyromegaly present.  Cardiovascular: Normal rate, regular rhythm, normal heart sounds and intact distal pulses. Exam reveals no gallop and no friction rub.  No murmur heard. Pulmonary/Chest: Effort normal and breath sounds normal. No respiratory distress. He has no wheezes. He has no rales. He exhibits no tenderness.  Abdominal: Soft. Bowel sounds are normal. He exhibits no distension and no mass. There is no tenderness. There is no rebound and no guarding.  Musculoskeletal: Normal range of motion. He exhibits no edema or tenderness.  Lymphadenopathy:    He has no cervical adenopathy.  Neurological: He is alert and oriented to person, place, and time. He has normal reflexes. No cranial nerve deficit. He exhibits normal muscle tone. He displays a negative Romberg sign. Coordination and gait normal.  Skin: Skin is warm and dry. No rash noted.  Psychiatric: He has a normal mood and affect. His behavior is normal. Judgment and thought content normal.    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 Audible grinding at the joint. EXAM: CT OF THE LOWER RIGHT EXTREMITY WITHOUT CONTRAST TECHNIQUE: Multidetector CT imaging of the right lower extremity was performed according to the standard protocol. COMPARISON:  None. FINDINGS: Bones/Joint/Cartilage No acute fracture or dislocation. Resection of the radial head which articulates normally with the proximal humerus. Tiny bony fragment adjacent to the periphery of lateral epicondyle likely related to postsurgical changes. Mild-moderate joint space narrowing of the ulnohumeral joint with small marginal osteophytes. No aggressive  osseous lesion. No significant joint effusion. Sclerotic bone lesion in the distal humerus most consistent with a bone island. Ligaments Suboptimally assessed by CT. Muscles and Tendons Muscles are normal. No muscle atrophy. No intramuscular fluid collection or hematoma. Intact biceps tendon. Intact triceps tendon. Soft tissues No fluid collection or hematoma.  No soft tissue mass. IMPRESSION: 1. Resection of the radial head which articulates normally with the proximal humerus. Tiny bony fragment adjacent to the periphery of lateral epicondyle likely related to postsurgical changes. 2. Mild-moderate osteoarthritis of the ulnohumeral joint. Electronically Signed   By: Kathreen Devoid   On: 02/27/2018 08:55    Assessment & Plan:   There are no diagnoses linked to this encounter.   No orders of the defined types were placed in this encounter.    Follow-up: No follow-ups on file.  Walker Kehr, MD

## 2018-04-01 NOTE — Telephone Encounter (Signed)
Please advise 

## 2018-04-01 NOTE — Assessment & Plan Note (Signed)
sildenafil prn

## 2018-04-01 NOTE — Assessment & Plan Note (Addendum)
Dr Adele Schilder Lorazepam prn  Potential benefits of a long term benzodiazepines  use as well as potential risks  and complications were explained to the patient and were aknowledged.  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

## 2018-04-01 NOTE — Assessment & Plan Note (Signed)
Stress discussed 

## 2018-04-01 NOTE — Telephone Encounter (Signed)
Pt came in and would like a referral for a Neurological Consult to have his cranial nerve looked at that is causing a high pitched noise when he turns his head a certain way.  There is change in hearing in certain neck movements and his spine doctor does not believe it is coming from the spine.  Physical therapy as not resolved it.  Please advise

## 2018-04-02 MED ORDER — ZOLPIDEM TARTRATE 10 MG PO TABS: 10.0000 mg | ORAL_TABLET | Freq: Every evening | ORAL | 1 refills | Status: DC | PRN

## 2018-04-02 NOTE — Telephone Encounter (Signed)
LM notifying pt

## 2018-04-02 NOTE — Telephone Encounter (Signed)
Alex [name] should start with ENT checkup with Dr. Wilburn Cornelia first.  Thank you

## 2018-04-03 ENCOUNTER — Ambulatory Visit: Payer: Self-pay

## 2018-04-03 NOTE — Telephone Encounter (Signed)
Returned call to pt.  Reported he got stung by a Yellow Jacket about 30 minutes ago.  Reported the sting was on left 5th finger.  C/o swelling, redness, and pain.  stated the swelling is about 2 x/ the size of a mosquito bite.  Rated pain at 8/10.  Reported he has been applying ice to the area.  Stated "I've been stung 11 times by a Yellow Jacket once, in the past, and did not have any allergic reaction."  Denied any presence of swelling of tongue or throat.  Denied any rash/ hives.  Denied any other symptoms.  Home Care advice given to pt., per protocol.  Verb. Understanding.  Knows to call back if symptoms worsen.            Reason for Disposition . Normal local reaction to bee, wasp, or yellow jacket sting  Answer Assessment - Initial Assessment Questions 1. TYPE: "What type of sting was it?" (bee, yellow jacket, etc.)      Yellow jacket  2. ONSET: "When did it occur?"      30 min. Ago  3. LOCATION: "Where is the sting located?"  "How many stings?"   5th Finger of the left hand 4. SWELLING SIZE: "How big is the swelling?" (inches or centimeters)     Looks about 2 x size of mosquito bite 5. REDNESS: "Is the area red or pink?" If so, ask "What size is area of redness?" (inches or cm). "When did the redness start?"     Some redness 6. PAIN: "Is there any pain?" If so, ask: "How bad is it?"  (Scale 1-10; or mild, moderate, severe)     Yes; 8/10 7. ITCHING: "Is there any itching?" If so, ask: "How bad is it?"      Denied itching 8. RESPIRATORY DISTRESS: "Describe your breathing."    Denied resp. Distress; or swelling of mouth, tongue or throat 9. PRIOR REACTIONS: "Have you had any severe allergic reactions to stings in the past?" if yes, ask: "What happened?"     Denied prior allergic reaction to stings  10. OTHER SYMPTOMS: "Do you have any other symptoms?" (e.g., face or tongue swelling, new rash elsewhere, abdominal pain, vomiting)       Denied tongue swelling, throat swelling, rash, abd.  Pain, N/V  Protocols used: BEE OR YELLOW JACKET STING-A-AH

## 2018-04-06 ENCOUNTER — Ambulatory Visit (INDEPENDENT_AMBULATORY_CARE_PROVIDER_SITE_OTHER): Payer: 59 | Admitting: Licensed Clinical Social Worker

## 2018-04-06 ENCOUNTER — Encounter (HOSPITAL_COMMUNITY): Payer: Self-pay | Admitting: Licensed Clinical Social Worker

## 2018-04-06 ENCOUNTER — Encounter

## 2018-04-06 DIAGNOSIS — F319 Bipolar disorder, unspecified: Secondary | ICD-10-CM

## 2018-04-06 NOTE — Progress Notes (Signed)
Therapist Progress Note   Time: 2:10-3pm  Participation Level: Active  Behavioral Response: Casual/Alert/ Anxious  Type of Therapy: Individual Therapy  Treatment Goals Addressed: Coping  Interventions: Supportive/ CBT  Summary: Pt presented for individual therapy. Pt discussed his psychiatric symptoms and current life events. Pt discussed his psychiatric symptoms and reports his moods are stable. Pt presented agitated and anxious. He reports his life continues to be full of family drama. His son continues with his unresolved psychiatric issues. His [place] been [place] from her living space and will now be moving home. Pt continues with the same unresolved issues. Continue to remind pt that change must come from him. Problem solving with boundaries for children he continues to pay phone bill and car insurance. Pt will talk with them about he is stopping paying these bills for them. Asked open ended questions. Pt talked about same family discord and how it affects his stress level and bp. Reminded pt of effective coping skills when dealing with all his family stressors.    Suicidal/Homicidal: No/ without plan  Therapist Response: Assessed pt's current functioning and reviewed progress. Assisted pt processing family, support group, medical issues. Assisted pt  processing for management of stressors.  Plan: Return in 4 weeks using CBT  Diagnosis: Axis 1: Bipolar 1 disorder,   [name], LCAS 04/06/18

## 2018-04-08 ENCOUNTER — Encounter: Payer: Self-pay | Admitting: Pulmonary Disease

## 2018-04-08 ENCOUNTER — Ambulatory Visit (INDEPENDENT_AMBULATORY_CARE_PROVIDER_SITE_OTHER): Payer: Managed Care, Other (non HMO) | Admitting: Pulmonary Disease

## 2018-04-08 VITALS — BP 130/80 | HR 88 | Ht 72.0 in | Wt 179.0 lb

## 2018-04-08 DIAGNOSIS — K219 Gastro-esophageal reflux disease without esophagitis: Secondary | ICD-10-CM

## 2018-04-08 DIAGNOSIS — J42 Unspecified chronic bronchitis: Secondary | ICD-10-CM | POA: Diagnosis not present

## 2018-04-08 DIAGNOSIS — D839 Common variable immunodeficiency, unspecified: Secondary | ICD-10-CM

## 2018-04-08 NOTE — Patient Instructions (Signed)
Follow up in 1 year.

## 2018-04-08 NOTE — Progress Notes (Signed)
[name]  Chief Complaint  Patient presents with  . Follow-up    pt states breathing is doing well. no sx today.     Constitutional: BP 130/80 (BP Location: Left Arm, Cuff Size: Normal)   Pulse 88   Ht 6' (1.829 m)   Wt 179 lb (81.2 kg)   SpO2 98%   BMI 24.28 kg/m   History of Present Illness: Alex [name] is a 62 y.o. male with chronic bronchitis with history of common variable immunodeficiency.  He was previously seen Dr. Ashok Cordia.    His breathing has been okay.  Not having cough, wheeze, sputum, congestion, runny nose, sore throat, fever, hemoptysis, skin rash, or gland swelling.  Doesn't feel his breathing limits his activity level.  Not using mucinex or symbicort at present.  Had esophageal dilation with Dr. Oletta Lamas earlier this month.  He is followed by Dr. Clarene Essex with allergy/immunology and is on immunoglobulin therapy.  Comprehensive Respiratory Exam:  Appearance - well kempt  ENMT - nasal mucosa moist, turbinates clear, midline nasal septum, no dental lesions, no gingival bleeding, no oral exudates, no tonsillar hypertrophy Neck - no masses, trachea midline, no thyromegaly, no elevation in JVP Respiratory - normal appearance of chest wall, normal respiratory effort w/o accessory muscle use, no dullness on percussion, no wheezing or rales CV - s1s2 regular rate and rhythm, no murmurs, no peripheral edema, no varicosities, radial pulses symmetric GI - soft, non tender, no masses Lymph - no adenopathy noted in neck and axillary areas MSK - normal muscle strength and tone, normal gait Ext - no cyanosis, clubbing, or joint inflammation noted Skin - no rashes, lesions, or ulcers Neuro - oriented to person, place, and time Psych - normal mood and affect   Assessment/Plan:  Chronic bronchitis. - no significant symptoms at present - prn symbicort, mucinex, flutter valve  Common variable immunodeficiency. - he is followed  by Dr. Pablo Lawrence in [place]  GERD with esophageal stricture. - followed by [facility]   Patient Instructions  Follow up in 1 year    [name], MD [place] 04/08/2018, 4:09 PM  Flow Sheet  Pulmonary tests: HRCT chest 03/19/16 >> 4.1 cm ascending thoracic aorta (reviewed by me) PFT 04/22/16 >> FEV1 3.95 (106%), FEV1% 78, TLC 7.50 (104%), DLCO 99%  Past Medical History: He  has a past medical history of Anemia, Anxiety, Bipolar I disorder ([place]), Bleeding ulcer (06/2014), Bleeding ulcer (2016), BPH (benign prostatic hypertrophy) with urinary obstruction, Cataract, Chronic back pain, Complication of anesthesia (POST URINARY RETENTION---  2006 SHOULDER SURGERY MARKED BRADYCARDIA VAGAL RESPONSE NO ISSUE W/ SURGERY AFTER THIS ONE), Coronary atherosclerosis (CARDIOLOGIST- DR CRENSHAW--  LAST VISIT 01-05-2012 IN [facility]), 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[place]), Depression, Epicondylitis, GERD (gastroesophageal reflux disease), Glaucoma (BOTH EYES), Hearing loss, History of chronic prostatitis, History of hiatal hernia, History of kidney stones, 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  Family History: His family history includes COPD in his father; Depression in his daughter, mother, and sister; Drug abuse in his daughter; Hypertension in his father; Kidney disease in his mother; Lung cancer in his brother; Prostate cancer in his father; Psychiatric Illness in his son; Rectal cancer in his sister; Stroke in his father.  Social History: He  reports that he has never smoked. He has never used smokeless tobacco. He reports that he drinks alcohol. He reports that he does not use drugs.  Medications: Allergies as of 04/08/2018      Reactions   Percocet [oxycodone-acetaminophen] Itching   Can take generic.  States only has a problem with percocet brand, also headache   Vicodin [hydrocodone-acetaminophen]    Itching and sleep disruption, can take generic      Medication List        Accurate as of 04/08/18  4:09 PM. Always use your most recent med list.          AEROCHAMBER MV inhaler Use as instructed   amLODipine 5 MG tablet Commonly known as:  NORVASC Take 1 tablet (5 mg total) by mouth daily.   aspirin EC 81 MG tablet Take 1 tablet by mouth daily.   atorvastatin 40 MG tablet Commonly known as:  LIPITOR Take 1 tablet (40 mg total) by mouth at bedtime.   AXIRON 30 MG/ACT Soln Generic drug:  Testosterone Place 2 Act onto the skin daily.     budesonide-formoterol 80-4.5 MCG/ACT inhaler Commonly known as:  SYMBICORT Inhale 2 puffs into the lungs 2 (two) times daily.   CO Q 10 PO Take 1 capsule by mouth daily.   FLUTTER Devi Use as directed   HIZENTRA 4 GM/20ML Soln Generic drug:  Immune Globulin (Human) Inject into the skin once a week. wednesday   LamoTRIgine 300 MG Tb24 24 hour tablet Take 1 tablet (300 mg total) by mouth daily.   levalbuterol 45 MCG/ACT inhaler Commonly known as:  XOPENEX HFA Inhale 2 puffs into the lungs every 6 (six) hours as needed for wheezing.   levothyroxine 150 MCG tablet Commonly known as:  SYNTHROID, LEVOTHROID TAKE 1 TABLET BY MOUTH EVERY DAY   lidocaine-prilocaine cream Commonly known as:  EMLA Apply 1 application topically as needed.   LORazepam 0.5 MG tablet Commonly known as:  ATIVAN Take 1 tab daily as needed and 1 tab at bed time   LUTEIN PO Take 1 tablet by mouth daily.   meloxicam 15 MG tablet Commonly known as:  MOBIC Take 1 tablet (15 mg total) by mouth daily as needed.   OMEGA-3 KRILL OIL PO Take 1 capsule by mouth daily.   sildenafil 25 MG tablet Commonly known as:  VIAGRA Take 5 mg by mouth daily as needed (BPH).   sucralfate 1 g tablet Commonly known as:  CARAFATE Take 1 g by mouth daily as needed (ACID REFLUX).   telmisartan 80 MG tablet Commonly known as:  MICARDIS TAKE 1 TABLET (80 MG TOTAL) BY MOUTH DAILY.  XALATAN 0.005 % ophthalmic solution Generic drug:  latanoprost 1 DROP EACH EYE AT NIGHT   zolpidem 10 MG tablet Commonly known as:  AMBIEN Take 1 tablet (10 mg total) by mouth at bedtime as needed. for sleep

## 2018-04-15 ENCOUNTER — Encounter (HOSPITAL_COMMUNITY): Payer: Self-pay | Admitting: Licensed Clinical Social Worker

## 2018-04-15 ENCOUNTER — Ambulatory Visit (INDEPENDENT_AMBULATORY_CARE_PROVIDER_SITE_OTHER): Payer: 59 | Admitting: Licensed Clinical Social Worker

## 2018-04-15 DIAGNOSIS — F319 Bipolar disorder, unspecified: Secondary | ICD-10-CM

## 2018-04-15 NOTE — Progress Notes (Signed)
Therapist Progress Note   Time: 3:10-4pm  Participation Level: Active  Behavioral Response: Casual/Alert/ Anxious  Type of Therapy: Individual Therapy  Treatment Goals Addressed: Coping  Interventions: Supportive/ CBT  Summary: Pt presented for individual therapy. Pt discussed his psychiatric symptoms and current life events. Pt discussed his psychiatric symptoms and reports his moods are stable. Pt presented agitated and anxious. He again reporteds his life continues to be full of family drama. His son continues with his  psychiatric issues and is currently in [facility] in [place]. His [place] been [place] from her living space and will now be moving home. Pt continues with the same unresolved issues. Continue to remind pt that change must begin with him. Discussed how his children don't respect him. Processed with pt steps to have his children regain trust. Discussed tough love with pt and how this can affect the relationships within his family drama. Asked open ended questions.    Suicidal/Homicidal: No/ without plan  Therapist Response: Assessed pt's current functioning and reviewed progress. Assisted pt processing family issues,  Tough love, respect . Assisted pt  processing for management of stressors.  Plan: Return in 4 weeks using CBT  Diagnosis: Axis 1: Bipolar 1 disorder,   [name], LCAS 04/15/18

## 2018-04-16 ENCOUNTER — Ambulatory Visit (HOSPITAL_COMMUNITY): Payer: Self-pay | Admitting: Licensed Clinical Social Worker

## 2018-04-19 ENCOUNTER — Other Ambulatory Visit (HOSPITAL_COMMUNITY): Payer: Self-pay | Admitting: Psychiatry

## 2018-04-19 DIAGNOSIS — F319 Bipolar disorder, unspecified: Secondary | ICD-10-CM

## 2018-04-22 ENCOUNTER — Encounter (INDEPENDENT_AMBULATORY_CARE_PROVIDER_SITE_OTHER): Payer: 59 | Admitting: Ophthalmology

## 2018-04-22 DIAGNOSIS — H43813 Vitreous degeneration, bilateral: Secondary | ICD-10-CM

## 2018-04-22 DIAGNOSIS — H35033 Hypertensive retinopathy, bilateral: Secondary | ICD-10-CM

## 2018-04-22 DIAGNOSIS — I10 Essential (primary) hypertension: Secondary | ICD-10-CM | POA: Diagnosis not present

## 2018-04-22 DIAGNOSIS — H353121 Nonexudative age-related macular degeneration, left eye, early dry stage: Secondary | ICD-10-CM | POA: Diagnosis not present

## 2018-04-22 DIAGNOSIS — H2512 Age-related nuclear cataract, left eye: Secondary | ICD-10-CM

## 2018-04-23 ENCOUNTER — Other Ambulatory Visit (HOSPITAL_COMMUNITY): Payer: Self-pay

## 2018-04-23 ENCOUNTER — Ambulatory Visit (HOSPITAL_COMMUNITY): Payer: Self-pay | Admitting: Licensed Clinical Social Worker

## 2018-04-23 DIAGNOSIS — F319 Bipolar disorder, unspecified: Secondary | ICD-10-CM

## 2018-04-23 MED ORDER — LAMOTRIGINE ER 300 MG PO TB24: 1.0000 | ORAL_TABLET | Freq: Every day | ORAL | 0 refills | Status: DC

## 2018-04-29 ENCOUNTER — Ambulatory Visit: Payer: Self-pay

## 2018-05-04 ENCOUNTER — Ambulatory Visit (HOSPITAL_COMMUNITY): Payer: Self-pay | Admitting: Licensed Clinical Social Worker

## 2018-05-11 ENCOUNTER — Other Ambulatory Visit: Payer: Self-pay | Admitting: *Deleted

## 2018-05-11 DIAGNOSIS — E7800 Pure hypercholesterolemia, unspecified: Secondary | ICD-10-CM

## 2018-05-11 NOTE — Progress Notes (Signed)
HPI: FU CAD. Cath in 2007 showed a 40 LAD; EF 50-55. Had normal ETT 7/11 with 10:00 on [place]. Echocardiogram December 2012 showed normal LV function and grade 1 diastolic dysfunction. Holter monitor in January 2014 showed sinus rhythm, PACs, PVCs and brief PAT. Pt seen by Dr Cyndia Bent 1/18 for TAA (4.1cm) and fu arranged one year.   CTA November 2018 showed mildly dilated ascending thoracic aorta measuring 4.3 cm.  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  Since he was last seen, the patient has dyspnea with more extreme activities but not with routine activities. It is relieved with rest. It is not associated with chest pain. There is no orthopnea, PND or pedal edema. There is no syncope or palpitations. There is no exertional chest pain.   Current Outpatient Medications  Medication Sig Dispense Refill  . amLODipine (NORVASC) 5 MG tablet Take 1 tablet (5 mg total) by mouth daily. 90 tablet 3  . aspirin EC 81 MG tablet Take 1 tablet by mouth daily.    [name] atorvastatin (LIPITOR) 80 MG tablet Take 1 tablet (80 mg total) by mouth at bedtime. 90 tablet 3  . AXIRON 30 MG/ACT SOLN Place 2 Act onto the skin daily.    . budesonide-formoterol (SYMBICORT) 80-4.5 MCG/ACT inhaler Inhale 2 puffs into the lungs 2 (two) times daily. (Patient taking differently: Inhale 2 puffs into the lungs 2 (two) times daily as needed (Bronchitis). ) 1 Inhaler 0  . Coenzyme Q10 (CO Q 10 PO) Take 1 capsule by mouth daily.     . Immune Globulin, Human, (HIZENTRA) 4 GM/20ML SOLN Inject into the skin once a week. wednesday    . LamoTRIgine 300 MG TB24 24 hour tablet Take 1 tablet (300 mg total) by mouth daily. 90 tablet 0  . latanoprost (XALATAN) 0.005 % ophthalmic solution 1 DROP EACH EYE AT NIGHT    . levalbuterol (XOPENEX HFA) 45 MCG/ACT inhaler Inhale 2 puffs into the lungs every 6 (six) hours as needed for wheezing. 1 Inhaler 5  . levothyroxine  (SYNTHROID, LEVOTHROID) 150 MCG tablet TAKE 1 TABLET BY MOUTH EVERY DAY (Patient taking differently: TAKE 1 TABLET BY MOUTH EVERY DAY AT BEDTIME) 90 tablet 3  . lidocaine-prilocaine (EMLA) cream Apply 1 application topically as needed.     [name] LORazepam (ATIVAN) 0.5 MG tablet Take 1 tab daily as needed and 1 tab at bed time 60 tablet 1  . LUTEIN PO Take 1 tablet by mouth daily.     . meloxicam (MOBIC) 15 MG tablet Take 1 tablet (15 mg total) by mouth daily as needed. 30 tablet 2  . OMEGA-3 KRILL OIL PO Take 1 capsule by mouth daily.     [name] Respiratory Therapy Supplies (FLUTTER) DEVI Use as directed 1 each 0  . sildenafil (VIAGRA) 25 MG tablet Take 5 mg by mouth daily as needed (BPH).     [name] Spacer/Aero-Holding Chambers (AEROCHAMBER MV) inhaler Use as instructed 1 each 0  . sucralfate (CARAFATE) 1 G tablet Take 1 g by mouth daily as needed (ACID REFLUX).   3  . telmisartan (MICARDIS) 80 MG tablet TAKE 1 TABLET (80 MG TOTAL) BY MOUTH DAILY. 90 tablet 3  . zolpidem (AMBIEN) 10 MG tablet Take 1 tablet (10 mg total) by mouth at bedtime as needed. for sleep 30 tablet 1   No current facility-administered medications for this visit.      Past Medical History:  Diagnosis  Date  . Anemia   . Anxiety   . Bipolar I disorder ([place])   . Bleeding ulcer 06/2014  . Bleeding ulcer 2016  . BPH (benign prostatic hypertrophy) with urinary obstruction   . Cataract    LEFT EYE  . Chronic back pain   . Complication of anesthesia POST URINARY RETENTION---  2006 SHOULDER SURGERY MARKED BRADYCARDIA VAGAL RESPONSE NO ISSUE W/ SURGERY AFTER THIS ONE   WITH GENERAL ANESTHESIA, 15 YRS AGO VASOVAGAL REACTION NONE SINCE  . Coronary atherosclerosis CARDIOLOGIST- DR CRENSHAW--  LAST VISIT 01-05-2012 IN [facility]   NON-OBSTRUCTIVE MILD DISEASE  . 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 Depression   . Epicondylitis    right elbow  . GERD (gastroesophageal reflux disease)   . Glaucoma BOTH EYES   RIGHT EYE RADIATION  DAMAGE  . Hearing loss    BOTH EARS  . History of chronic prostatitis   . History of hiatal hernia    SMALL  . History of kidney stones   . 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 History of thyroid cancer PRIMARY (NO METS FROM ORBITAL CANCER)--   IN REMISSION   S/P TOTAL THYROIDECTOMY  , CHEMORADIATION  (ONCOLOGIST -- DR Griffith Citron)  . Hyperlipidemia   . Hypertension   . Nocturia   . OA (osteoarthritis)   . Peripheral vascular disease ([facility])    THORACIC AA 3. 9 CM X 4. 3 CM PER NOV 06-14-17  CHEST CTFOLOWED BY DR CRENSHAW YEARLY FOR  . Positional vertigo    HX OF WITH SINUS INFECTIONS  . Tinnitus    CONSTANT  . Ulnar nerve compression    right elbow  . Urinary hesitancy     Past Surgical History:  Procedure Laterality Date  . CARDIAC CATHETERIZATION  01-16-2006  DR Marcello Moores WALL   MILD CORONARY ATHEROSCLEROSIS/ MID TO DISTAL LAD 40% STENOSIS/ LVF 50-55%  . CARPAL TUNNEL RELEASE Right 11/03/2017   Procedure: RIGHT HAND CARPAL TUNNEL RELEASE;  Surgeon: Iran Planas, MD;  Location: [facility];  Service: Orthopedics;  Laterality: Right;  . COLONSCOPY  2017 LAST DONE   MULTIPLE  . ENDOSCOPY  LAST 2017   MULTIPLE DONE DILATION DONE ALSO  . ESOPHAGOGASTRODUODENOSCOPY (EGD) WITH PROPOFOL N/A 02/24/2018   Procedure: ESOPHAGOGASTRODUODENOSCOPY (EGD) WITH PROPOFOL;  Surgeon: Laurence Spates, MD;  Location: [facility];  Service: Endoscopy;  Laterality: N/A;  . EXCISION RADIAL HEAD Right 11/03/2017   Procedure: RIGHT PROXIMAL RADIUS RADIAL HEAD RESECTION AND JOINT DEBRIDEMENT;  Surgeon: Iran Planas, MD;  Location: [place];  Service: Orthopedics;  Laterality: Right;  . KNEE ARTHROSCOPY  05/01/2012   Procedure: ARTHROSCOPY KNEE;  Surgeon: Johnn Hai, MD;  Location: [facility];   Service: Orthopedics;  Laterality: Left;  debridement and removal of loose body  . LEFT ANKLE ARTHROSCOPY W/ DEBRIDEMENT  05-12-2007  . LEFT HYDROCELECTOMY  03-29-2005   AND REPAIR LEFT INGUINAL HERNIA W/ MESH  . MOHS SURGERY  2002   RIGHT ORBITAL CANCER  . NASAL ENDOSCOPY  08-07-2005   RIGHT EPISTAXIS  / POST SEPTOPLASTY  (HX RIGHT ORBITAL CA & S/P RADIATION/ NECROSIS ANTERIOR END OF BOTH INFERIOR TURBINATES)  . occuloplastic surgery  2002  . PARS PLANA VITRECTOMY  11-06-2004  RIGHT EYE RADIATION RETINOPATHY W/ HEMORRHAGE  . REPAIR UNDESENDED RIGHT TESTICLE / RIGHT INGUINAL HERNIA  AGE 2  . RIGHT ANKLE ARTHROSCOPY W/ EXTENSIVE DEBRIDEMENT  04-05-2008   . RIGHT SHOULDER SURGERY  2006  . RIGHT SUPRAOMOHYOID NECK DISSECTION   03-08-2003   ZONES 1,2,3;   SUBMANDIBULAR MASS / METASTATIC SQUAMOUS CELL CARCINOMA RIGHT NECK  . SAVORY DILATION N/A 02/24/2018   Procedure: SAVORY DILATION;  Surgeon: Laurence Spates, MD;  Location: [facility];  Service: Endoscopy;  Laterality: N/A;  . SEPTOPLASTY  NOV 2006  . SHOULDER ARTHROSCOPY W/ SUBACROMIAL DECOMPRESSION AND DISTAL CLAVICLE EXCISION  10-09-2008   AND DEBRIDEMENT OF RIGHT SHOULDER IMPINGEMENT & [facility] JOINT ARTHRITIS  . SPINE SURGERY  2016   l 3 TO l 4 PLATE AND SCREWS  . TOTAL THYROIDECTOMY  11-03-2001   PAPILLARY THYROID CARCINOMA  . TRANSTHORACIC ECHOCARDIOGRAM  12/ 2012   grade I diastolic dysfunction/ ef 09-38%  . ULNAR NERVE TRANSPOSITION Right 04/28/2014   Procedure: RIGHT ELBOW ULNA NERVE RELEASE TRANSPOSTION AND MEDIAL EPICONDYLAR DEBRIDEMENT AND REPAIR;  Surgeon: Linna Hoff, MD;  Location: [place];  Service: Orthopedics;  Laterality: Right;    Social History   Socioeconomic History  . Marital status: Married    Spouse name: Not on file  . Number of children: Not on file  . Years of education: Not on file  . Highest education level: Not on file  Occupational History  . Not on file  Social Needs  .  Financial resource strain: Not on file  . Food insecurity:    Worry: Not on file    Inability: Not on file  . Transportation needs:    Medical: Not on file    Non-medical: Not on file  Tobacco Use  . Smoking status: Never Smoker  . Smokeless tobacco: Never Used  Substance and Sexual Activity  . Alcohol use: Yes    Alcohol/week: 0.0 standard drinks    Comment: 1-2 glasses per month  . Drug use: No  . Sexual activity: Yes    Partners: Female    Birth control/protection: None  Lifestyle  . Physical activity:    Days per week: Not on file    Minutes per session: Not on file  . Stress: Not on file  Relationships  . Social connections:    Talks on phone: Not on file    Gets together: Not on file    Attends religious service: Not on file    Active member of club or organization: Not on file    Attends meetings of clubs or organizations: Not on file    Relationship status: Not on file  . Intimate partner violence:    Fear of current or ex partner: Not on file    Emotionally abused: Not on file    Physically abused: Not on file    Forced sexual activity: Not on file  Other Topics Concern  . Not on file  Social History Narrative   Originally from [place]. Moved to [place] in 1984. 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 No international travel other than [place]. Has a dog at home, poodle mix. His daughter who lives with him now has a dog. No mold exposure. No bird exposure. No hot tub exposure. Enjoys gardening.     Family History  Problem Relation Age of Onset  .  Depression Sister   . Rectal cancer Sister   . Lung cancer Brother   . Kidney disease Mother   . Depression Mother   . Stroke Father   . COPD Father   . Hypertension Father   . Prostate cancer Father   . Depression Daughter   . Drug abuse Daughter   . Psychiatric Illness Son      ROS: no fevers or chills, productive cough, hemoptysis, dysphasia, odynophagia, melena, hematochezia, dysuria, hematuria, rash, seizure activity, orthopnea, PND, pedal edema, claudication. Remaining systems are negative.  Physical Exam: Well-developed well-nourished in no acute distress.  Skin is warm and dry.  HEENT is normal.  Neck is supple.  Chest is clear to auscultation with normal expansion.  Cardiovascular exam is regular rate and rhythm.  Abdominal exam nontender or distended. No masses palpated. Extremities show no edema. neuro grossly intact  ECG-normal sinus rhythm at a rate of 80.  No ST changes.  Personally reviewed  A/P  1 coronary artery disease-continue aspirin and statin.  2 thoracic aortic aneurysm-follow-up MRA November 2019.  3 hypertension-diastolic blood pressure mildly elevated. Increase amlodipine to 10 mg daily and follow.   4 hyperlipidemia-Lipitor increased to 80 mg daily recently.  Check lipids and liver 4 weeks later.  [name], MD

## 2018-05-13 ENCOUNTER — Other Ambulatory Visit: Payer: Self-pay | Admitting: *Deleted

## 2018-05-13 DIAGNOSIS — E7800 Pure hypercholesterolemia, unspecified: Secondary | ICD-10-CM

## 2018-05-13 LAB — LIPID PANEL
Chol/HDL Ratio: 2.2 ratio (ref 0.0–5.0)
Cholesterol, Total: 166 mg/dL (ref 100–199)
HDL: 74 mg/dL (ref >39)
LDL Calculated: 75 mg/dL (ref 0–99)
Triglycerides: 85 mg/dL (ref 0–149)
VLDL Cholesterol Cal: 17 mg/dL (ref 5–40)

## 2018-05-13 LAB — COMPREHENSIVE METABOLIC PANEL
ALT: 40 IU/L (ref 0–44)
AST: 36 IU/L (ref 0–40)
Albumin/Globulin Ratio: 2.3 — ABNORMAL HIGH (ref 1.2–2.2)
Albumin: 4.8 g/dL (ref 3.6–4.8)
Alkaline Phosphatase: 77 IU/L (ref 39–117)
BUN/Creatinine Ratio: 13 (ref 10–24)
BUN: 15 mg/dL (ref 8–27)
Bilirubin Total: 0.8 mg/dL (ref 0.0–1.2)
CO2: 22 mmol/L (ref 20–29)
Calcium: 9.2 mg/dL (ref 8.6–10.2)
Chloride: 102 mmol/L (ref 96–106)
Creatinine, Ser: 1.18 mg/dL (ref 0.76–1.27)
GFR calc Af Amer: 75 mL/min/{1.73_m2} (ref >59)
GFR calc non Af Amer: 65 mL/min/{1.73_m2} (ref >59)
Globulin, Total: 2.1 g/dL (ref 1.5–4.5)
Glucose: 84 mg/dL (ref 65–99)
Potassium: 4.7 mmol/L (ref 3.5–5.2)
Sodium: 141 mmol/L (ref 134–144)
Total Protein: 6.9 g/dL (ref 6.0–8.5)

## 2018-05-13 MED ORDER — ATORVASTATIN CALCIUM 80 MG PO TABS: 80.0000 mg | ORAL_TABLET | Freq: Every day | ORAL | 3 refills | Status: DC

## 2018-05-15 ENCOUNTER — Ambulatory Visit (INDEPENDENT_AMBULATORY_CARE_PROVIDER_SITE_OTHER): Payer: Managed Care, Other (non HMO) | Admitting: Cardiology

## 2018-05-15 ENCOUNTER — Encounter: Payer: Self-pay | Admitting: Cardiology

## 2018-05-15 VITALS — BP 128/94 | Ht 72.0 in | Wt 178.8 lb

## 2018-05-15 DIAGNOSIS — I10 Essential (primary) hypertension: Secondary | ICD-10-CM

## 2018-05-15 DIAGNOSIS — I712 Thoracic aortic aneurysm, without rupture, unspecified: Secondary | ICD-10-CM

## 2018-05-15 DIAGNOSIS — E7800 Pure hypercholesterolemia, unspecified: Secondary | ICD-10-CM | POA: Diagnosis not present

## 2018-05-15 DIAGNOSIS — I2510 Atherosclerotic heart disease of native coronary artery without angina pectoris: Secondary | ICD-10-CM | POA: Diagnosis not present

## 2018-05-15 MED ORDER — AMLODIPINE BESYLATE 10 MG PO TABS: 10.0000 mg | ORAL_TABLET | Freq: Every day | ORAL | 4 refills | Status: DC

## 2018-05-15 NOTE — Patient Instructions (Signed)
Medication Instructions:  INCREASE AMLODIPINE TO 10 MG ONCE DAILY= 2 OF THE 5 MG TABLETS ONCE DAILY If you need a refill on your cardiac medications before your next appointment, please call your pharmacy.   Lab work: If you have labs (blood work) drawn today and your tests are completely normal, you will receive your results only by: [name] MyChart Message (if you have MyChart) OR . A paper copy in the mail If you have any lab test that is abnormal or we need to change your treatment, we will call you to review the results.  Testing/Procedures: MRA OF THE CHEST W/WO TO FOLLOW UP THORACIC ANEURYSM  Follow-Up: At [facility], you and your health needs are our priority.  As part of our continuing mission to provide you with exceptional heart care, we have created designated Provider Care Teams.  These Care Teams include your primary Cardiologist (physician) and Advanced Practice Providers (APPs -  Physician Assistants and Nurse Practitioners) who all work together to provide you with the care you need, when you need it. You will need a follow up appointment in 12 months.  Please call our office 2 months in advance to schedule this appointment.  You may see [name], MD or one of the following Advanced Practice Providers on your designated Care Team:   Kerin Ransom, PA-C Roby Lofts, [place] . Sande Rives, PA-C

## 2018-05-18 ENCOUNTER — Ambulatory Visit (INDEPENDENT_AMBULATORY_CARE_PROVIDER_SITE_OTHER): Payer: Managed Care, Other (non HMO) | Admitting: Licensed Clinical Social Worker

## 2018-05-18 ENCOUNTER — Encounter (HOSPITAL_COMMUNITY): Payer: Self-pay | Admitting: Licensed Clinical Social Worker

## 2018-05-18 DIAGNOSIS — F319 Bipolar disorder, unspecified: Secondary | ICD-10-CM | POA: Diagnosis not present

## 2018-05-18 NOTE — Progress Notes (Signed)
Therapist Progress Note   Time: 4:10-5pm  Participation Level: Active  Behavioral Response: Casual/Alert/ Anxious  Type of Therapy: Individual Therapy  Treatment Goals Addressed: Coping  Interventions: Supportive/ CBT  Summary: Pt presented for individual therapy. Pt discussed his psychiatric symptoms and current life events. Pt discussed his psychiatric symptoms and reports his moods are somewhat stable. Pt presented agitated and anxious, once again about his family drama. Son with mental health issues as been discharged from [facility] and is back at home. His daughter that has been evicted from her previous living situation is back at home, not working, sleeping all day and not paying rent.Pt continues with the same unresolved issues. Reminded pt that change must begin with him with boundaries for his children.  Discussed with pt how to discuss options with his wife and as a united front call a family meeting with new household rules. Asked open ended questions.    Suicidal/Homicidal: No/ without plan  Therapist Response: Assessed pt's current functioning and reviewed progress. Assisted pt processing family issues,  United front, family meeting, change. Assisted pt  processing for management of stressors.  Plan: Return in 4 weeks using CBT  Diagnosis: Axis 1: Bipolar 1 disorder,   [name], LCAS 05/18/18

## 2018-05-25 ENCOUNTER — Ambulatory Visit (INDEPENDENT_AMBULATORY_CARE_PROVIDER_SITE_OTHER): Payer: 59 | Admitting: Psychiatry

## 2018-05-25 VITALS — BP 126/74 | Ht 72.0 in | Wt 179.0 lb

## 2018-05-25 DIAGNOSIS — F411 Generalized anxiety disorder: Secondary | ICD-10-CM | POA: Diagnosis not present

## 2018-05-25 DIAGNOSIS — F3162 Bipolar disorder, current episode mixed, moderate: Secondary | ICD-10-CM

## 2018-05-25 MED ORDER — LAMOTRIGINE ER 300 MG PO TB24: 1.0000 | ORAL_TABLET | Freq: Every day | ORAL | 0 refills | Status: DC

## 2018-05-25 MED ORDER — ZOLPIDEM TARTRATE 10 MG PO TABS: 10.0000 mg | ORAL_TABLET | Freq: Every evening | ORAL | 1 refills | Status: DC | PRN

## 2018-05-25 MED ORDER — LORAZEPAM 0.5 MG PO TABS: ORAL_TABLET | ORAL | 2 refills | Status: DC

## 2018-05-25 NOTE — Progress Notes (Signed)
[facility] MD/PA/NP OP Progress Note  05/25/2018 1:06 PM Alex [name]  MRN:  [id number]  Chief Complaint: I have family issues but I am doing better on my medication.  HPI: Alex [name] came for his follow-up appointment.  He endorsed chronic family issues.  His daughter moved in with him and continues to do trauma and manipulation.  But he is relieved that son is doing better and able to keep his job for the past 3 weeks.  Patient also endorsed health issues which has been increased and he has to see a cardiologist and GI.  He was diagnosed with aneurysm but his physician told him to keep monitor since size of the aneurysm is not more than 5 cm.  He takes Lamictal, Ativan and Ambien.  He admitted lately has been taking Ativan on a regular basis to help his anxiety.  His sleep is better.  His wife works at [employer]  He also seeing [name] for therapy.  Denies any hallucination, paranoia, suicidal thoughts or homicidal thought.  He denies any agitation or severe mood swings.  Like to continue his current medication.  He does not ask for early refills.  Denies drinking or using any illegal substances.  His energy level is okay.  He denies using drugs or alcohol.  Visit Diagnosis:    ICD-10-CM   1. Bipolar 1 disorder, mixed, moderate (HCC) F31.62 zolpidem (AMBIEN) 10 MG tablet    LamoTRIgine 300 MG TB24 24 hour tablet    DISCONTINUED: LORazepam (ATIVAN) 0.5 MG tablet  2. GAD (generalized anxiety disorder) F41.1 LORazepam (ATIVAN) 0.5 MG tablet    Past Psychiatric History: Reviewed Patient has been seeing in this office since 2009. He has history of depression mood swing and anger. He's been admitted to behavioral [facility] due to suicidal thoughts but he denies any suicidal attempt. He denies any psychosis but admitted poor impulse control. In the past he has taken Seroquel and Cymbalta  Past Medical History:  Past Medical History:  Diagnosis Date  . Anemia   . Anxiety   . Bipolar I  disorder ([place])   . Bleeding ulcer 06/2014  . Bleeding ulcer 2016  . BPH (benign prostatic hypertrophy) with urinary obstruction   . Cataract    LEFT EYE  . Chronic back pain   . Complication of anesthesia POST URINARY RETENTION---  2006 SHOULDER SURGERY MARKED BRADYCARDIA VAGAL RESPONSE NO ISSUE W/ SURGERY AFTER THIS ONE   WITH GENERAL ANESTHESIA, 15 YRS AGO VASOVAGAL REACTION NONE SINCE  . Coronary atherosclerosis CARDIOLOGIST- DR CRENSHAW--  LAST VISIT 01-05-2012 IN EPIC   NON-OBSTRUCTIVE MILD DISEASE  . 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 Depression   . Epicondylitis    right elbow  . GERD (gastroesophageal reflux disease)   . Glaucoma BOTH EYES   RIGHT EYE RADIATION DAMAGE  . Hearing loss    BOTH EARS  . History of chronic prostatitis   . History of hiatal hernia    SMALL  . History of kidney stones   . 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 History of thyroid cancer PRIMARY (NO METS FROM ORBITAL CANCER)--   IN REMISSION   S/P TOTAL THYROIDECTOMY  , CHEMORADIATION  (ONCOLOGIST -- DR Griffith Citron)  . Hyperlipidemia   .  Hypertension   . Nocturia   . OA (osteoarthritis)   . Peripheral vascular disease (HCC)    THORACIC AA 3. 9 CM X 4. 3 CM PER NOV 06-14-17  CHEST CTFOLOWED BY DR CRENSHAW YEARLY FOR  . Positional vertigo    HX OF WITH SINUS INFECTIONS  . Tinnitus    CONSTANT  . Ulnar nerve compression    right elbow  . Urinary hesitancy     Past Surgical History:  Procedure Laterality Date  . CARDIAC CATHETERIZATION  01-16-2006  DR Marcello Moores WALL   MILD CORONARY ATHEROSCLEROSIS/ MID TO DISTAL LAD 40% STENOSIS/ LVF 50-55%  . CARPAL TUNNEL RELEASE Right 11/03/2017   Procedure: RIGHT HAND CARPAL TUNNEL RELEASE;  Surgeon: Iran Planas, MD;  Location[facility];  Service: Orthopedics;   Laterality: Right;  . COLONSCOPY  2017 LAST DONE   MULTIPLE  . ENDOSCOPY  LAST 2017   MULTIPLE DONE DILATION DONE ALSO  . ESOPHAGOGASTRODUODENOSCOPY (EGD) WITH PROPOFOL N/A 02/24/2018   Procedure: ESOPHAGOGASTRODUODENOSCOPY (EGD) WITH PROPOFOL;  Surgeon: Laurence Spates, MD;  [facility];  Service: Endoscopy;  Laterality: N/A;  . EXCISION RADIAL HEAD Right 11/03/2017   Procedure: RIGHT PROXIMAL RADIUS RADIAL HEAD RESECTION AND JOINT DEBRIDEMENT;  Surgeon: Iran Planas, MD;  Location: [place];  Service: Orthopedics;  Laterality: Right;  . KNEE ARTHROSCOPY  05/01/2012   Procedure: ARTHROSCOPY KNEE;  Surgeon: Johnn Hai, MD;  Location: [facility];  Service: Orthopedics;  Laterality: Left;  debridement and removal of loose body  . LEFT ANKLE ARTHROSCOPY W/ DEBRIDEMENT  05-12-2007  . LEFT HYDROCELECTOMY  03-29-2005   AND REPAIR LEFT INGUINAL HERNIA W/ MESH  . MOHS SURGERY  2002   RIGHT ORBITAL CANCER  . NASAL ENDOSCOPY  08-07-2005   RIGHT EPISTAXIS  / POST SEPTOPLASTY  (HX RIGHT ORBITAL CA & S/P RADIATION/ NECROSIS ANTERIOR END OF BOTH INFERIOR TURBINATES)  . occuloplastic surgery  2002  . PARS PLANA VITRECTOMY  11-06-2004   RIGHT EYE RADIATION RETINOPATHY W/ HEMORRHAGE  . REPAIR UNDESENDED RIGHT TESTICLE / RIGHT INGUINAL HERNIA  AGE 62  . RIGHT ANKLE ARTHROSCOPY W/ EXTENSIVE DEBRIDEMENT  04-05-2008   . RIGHT SHOULDER SURGERY  2006  . RIGHT SUPRAOMOHYOID NECK DISSECTION   03-08-2003   ZONES 1,2,3;   SUBMANDIBULAR MASS / METASTATIC SQUAMOUS CELL CARCINOMA RIGHT NECK  . SAVORY DILATION N/A 02/24/2018   Procedure: SAVORY DILATION;  Surgeon: Laurence Spates, MD;  [facility];  Service: Endoscopy;  Laterality: N/A;  . SEPTOPLASTY  NOV 2006  . SHOULDER ARTHROSCOPY W/ SUBACROMIAL DECOMPRESSION AND DISTAL CLAVICLE EXCISION  10-09-2008   AND DEBRIDEMENT OF RIGHT SHOULDER IMPINGEMENT & [facility] JOINT ARTHRITIS  . SPINE SURGERY  2016   l 3 TO l 4  PLATE AND SCREWS  . TOTAL THYROIDECTOMY  11-03-2001   PAPILLARY THYROID CARCINOMA  . TRANSTHORACIC ECHOCARDIOGRAM  12/ 2012   grade I diastolic dysfunction/ ef 58-85%  . ULNAR NERVE TRANSPOSITION Right 04/28/2014   Procedure: RIGHT ELBOW ULNA NERVE RELEASE TRANSPOSTION AND MEDIAL EPICONDYLAR DEBRIDEMENT AND REPAIR;  Surgeon: Linna Hoff, MD;  Location: [place];  Service: Orthopedics;  Laterality: Right;    Family Psychiatric History: Viewed  Family History:  Family History  Problem Relation Age of Onset  . Depression Sister   . Rectal cancer Sister   . Lung cancer Brother   . Kidney disease Mother   . Depression Mother   . Stroke Father   . COPD  Father   . Hypertension Father   . Prostate cancer Father   . Depression Daughter   . Drug abuse Daughter   . Psychiatric Illness Son     Social History:  Social History   Socioeconomic History  . Marital status: Married    Spouse name: Not on file  . Number of children: Not on file  . Years of education: Not on file  . Highest education level: Not on file  Occupational History  . Not on file  Social Needs  . Financial resource strain: Not on file  . Food insecurity:    Worry: Not on file    Inability: Not on file  . Transportation needs:    Medical: Not on file    Non-medical: Not on file  Tobacco Use  . Smoking status: Never Smoker  . Smokeless tobacco: Never Used  Substance and Sexual Activity  . Alcohol use: Yes    Alcohol/week: 0.0 standard drinks    Comment: 1-2 glasses per month  . Drug use: No  . Sexual activity: Yes    Partners: Female    Birth control/protection: None  Lifestyle  . Physical activity:    Days per week: Not on file    Minutes per session: Not on file  . Stress: Not on file  Relationships  . Social connections:    Talks on phone: Not on file    Gets together: Not on file    Attends religious service: Not on file    Active member of club or organization: Not on  file    Attends meetings of clubs or organizations: Not on file    Relationship status: Not on file  Other Topics Concern  . Not on file  Social History Narrative   Originally from [place]. Moved to [place] in 1984. 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 No international travel other than [place]. Has a dog at home, poodle mix. His daughter who lives with him now has a dog. No mold exposure. No bird exposure. No hot tub exposure. Enjoys gardening.     Allergies:  Allergies  Allergen Reactions  . Percocet [Oxycodone-Acetaminophen] Itching    Can take generic.  States only has a problem with percocet brand, also headache  . Vicodin [Hydrocodone-Acetaminophen]     Itching and sleep disruption, can take generic    Metabolic Disorder Labs: Recent Results (from the past 2160 hour(s))  Comprehensive Metabolic Panel (CMET)     Status: Abnormal   Collection Time: 05/12/18 10:32 AM  Result Value Ref Range   Glucose 84 65 - 99 mg/dL   BUN 15 8 - 27 mg/dL   Creatinine, Ser 1.18 0.76 - 1.27 mg/dL   GFR calc non Af Amer 65 >59 mL/min/1.73   GFR calc Af Amer 75 >59 mL/min/1.73   BUN/Creatinine Ratio 13 10 - 24   Sodium 141 134 - 144 mmol/L   Potassium 4.7 3.5 - 5.2 mmol/L   Chloride 102 96 - 106 mmol/L   CO2 22 20 - 29 mmol/L   Calcium 9.2 8.6 - 10.2 mg/dL   Total Protein 6.9 6.0 - 8.5 g/dL   Albumin 4.8 3.6 - 4.8 g/dL   Globulin, Total 2.1 1.5 - 4.5 g/dL   Albumin/Globulin Ratio 2.3 (H) 1.2 - 2.2   Bilirubin Total 0.8 0.0 - 1.2 mg/dL  Alkaline Phosphatase 77 39 - 117 IU/L   AST 36 0 - 40 IU/L   ALT 40 0 - 44 IU/L  Lipid panel     Status: None   Collection Time: 05/12/18 10:32 AM  Result Value Ref Range   Cholesterol, Total 166 100 - 199 mg/dL   Triglycerides 85 0 - 149 mg/dL   HDL 74 >39 mg/dL   VLDL Cholesterol Cal 17 5 - 40 mg/dL   LDL Calculated 75  0 - 99 mg/dL   Chol/HDL Ratio 2.2 0.0 - 5.0 ratio    Comment:                                   T. Chol/HDL Ratio                                             Men  Women                               1/2 Avg.Risk  3.4    3.3                                   Avg.Risk  5.0    4.4                                2X Avg.Risk  9.6    7.1                                3X Avg.Risk 23.4   11.0    Lab Results  Component Value Date   HGBA1C 6.1 08/04/2012   MPG 123 (H) 01/05/2012   No results found for: PROLACTIN Lab Results  Component Value Date   CHOL 166 05/12/2018   TRIG 85 05/12/2018   HDL 74 05/12/2018   CHOLHDL 2.2 05/12/2018   VLDL 35 (H) 07/23/2016   LDLCALC 75 05/12/2018   LDLCALC 42 07/23/2016   Lab Results  Component Value Date   TSH 2.08 02/13/2018   TSH 6.25 (H) 11/17/2017    Therapeutic Level Labs: No results found for: LITHIUM No results found for: VALPROATE No components found for:  CBMZ  Current Medications: Current Outpatient Medications  Medication Sig Dispense Refill  . amLODipine (NORVASC) 10 MG tablet Take 1 tablet (10 mg total) by mouth daily. 90 tablet 4  . aspirin EC 81 MG tablet Take 1 tablet by mouth daily.    [name] atorvastatin (LIPITOR) 80 MG tablet Take 1 tablet (80 mg total) by mouth at bedtime. 90 tablet 3  . AXIRON 30 MG/ACT SOLN Place 2 Act onto the skin daily.    . budesonide-formoterol (SYMBICORT) 80-4.5 MCG/ACT inhaler Inhale 2 puffs into the lungs 2 (two) times daily. (Patient taking differently: Inhale 2 puffs into the lungs 2 (two) times daily as needed (Bronchitis). ) 1 Inhaler 0  . Coenzyme Q10 (CO Q 10 PO) Take 1 capsule by mouth daily.     . Immune Globulin, Human, (HIZENTRA) 4 GM/20ML SOLN Inject into the skin once a week. wednesday    .  LamoTRIgine 300 MG TB24 24 hour tablet Take 1 tablet (300 mg total) by mouth daily. 90 tablet 0  . latanoprost (XALATAN) 0.005 % ophthalmic solution 1 DROP EACH EYE AT NIGHT    . levalbuterol (XOPENEX  HFA) 45 MCG/ACT inhaler Inhale 2 puffs into the lungs every 6 (six) hours as needed for wheezing. 1 Inhaler 5  . levothyroxine (SYNTHROID, LEVOTHROID) 150 MCG tablet TAKE 1 TABLET BY MOUTH EVERY DAY (Patient taking differently: TAKE 1 TABLET BY MOUTH EVERY DAY AT BEDTIME) 90 tablet 3  . lidocaine-prilocaine (EMLA) cream Apply 1 application topically as needed.     [name] LORazepam (ATIVAN) 0.5 MG tablet Take 1 tab daily as needed and 1 tab at bed time 60 tablet 1  . LUTEIN PO Take 1 tablet by mouth daily.     . meloxicam (MOBIC) 15 MG tablet Take 1 tablet (15 mg total) by mouth daily as needed. 30 tablet 2  . OMEGA-3 KRILL OIL PO Take 1 capsule by mouth daily.     [name] Respiratory Therapy Supplies (FLUTTER) DEVI Use as directed 1 each 0  . sildenafil (VIAGRA) 25 MG tablet Take 5 mg by mouth daily as needed (BPH).     [name] Spacer/Aero-Holding Chambers (AEROCHAMBER MV) inhaler Use as instructed 1 each 0  . sucralfate (CARAFATE) 1 G tablet Take 1 g by mouth daily as needed (ACID REFLUX).   3  . telmisartan (MICARDIS) 80 MG tablet TAKE 1 TABLET (80 MG TOTAL) BY MOUTH DAILY. 90 tablet 3  . zolpidem (AMBIEN) 10 MG tablet Take 1 tablet (10 mg total) by mouth at bedtime as needed. for sleep 30 tablet 1   No current facility-administered medications for this visit.      Musculoskeletal: Strength & Muscle Tone: within normal limits Gait & Station: normal Patient leans: N/A  Psychiatric Specialty Exam: ROS  There were no vitals taken for this visit.There is no height or weight on file to calculate BMI.  General Appearance: Casual  Eye Contact:  Good  Speech:  Slow  Volume:  Normal  Mood:  Anxious  Affect:  Congruent  Thought Process:  Goal Directed  Orientation:  Full (Time, Place, and Person)  Thought Content: Logical   Suicidal Thoughts:  No  Homicidal Thoughts:  No  Memory:  Immediate;   Good Recent;   Good Remote;   Good  Judgement:  Good  Insight:  Good  Psychomotor Activity:  Normal   Concentration:  Concentration: Fair and Attention Span: Fair  Recall:  Good  Fund of Knowledge: Good  Language: Good  Akathisia:  No  Handed:  Right  AIMS (if indicated): not done  Assets:  Communication Skills Desire for Improvement Housing Social Support  ADL's:  Intact  Cognition: WNL  Sleep:  Fair   Screenings: GAD-7     Office Visit from 08/01/2017 in [place]  Total GAD-7 Score  6    PHQ2-9     Office Visit from 08/01/2017 in [place]  PHQ-2 Total Score  2  PHQ-9 Total Score  4       Assessment and Plan: Bipolar disorder type I.  Generalized anxiety disorder.  Patient is stable on his current medication.  He has no rash or itching with the Lamictal.  Continue Lamictal 300 mg daily, Ativan 0.5 mg twice a day for anxiety and Ambien 5 to 10 mg for insomnia.  Encouraged to keep appointment with [name] for therapy.  Recommended  to call us back if he has any question or any concern.  Follow-up in 3 months.   Kathlee Nations, MD 05/25/2018, 1:06 PM

## 2018-06-01 ENCOUNTER — Encounter (HOSPITAL_COMMUNITY): Payer: Self-pay | Admitting: Licensed Clinical Social Worker

## 2018-06-01 ENCOUNTER — Ambulatory Visit (INDEPENDENT_AMBULATORY_CARE_PROVIDER_SITE_OTHER): Payer: 59 | Admitting: Licensed Clinical Social Worker

## 2018-06-01 DIAGNOSIS — F411 Generalized anxiety disorder: Secondary | ICD-10-CM

## 2018-06-01 DIAGNOSIS — F3162 Bipolar disorder, current episode mixed, moderate: Secondary | ICD-10-CM | POA: Diagnosis not present

## 2018-06-01 NOTE — Progress Notes (Signed)
Therapist Progress Note   Time: 4:10-5pm  Participation Level: Active  Behavioral Response: Casual/Alert/ Anxious  Type of Therapy: Individual Therapy  Treatment Goals Addressed: Coping  Interventions: Supportive: CBT/Tx plan review  Summary: Pt presented for individual therapy. Pt discussed his psychiatric symptoms and current life events. Pt discussed his psychiatric symptoms and reports his moods are somewhat stable. Pt saw Dr. Adele Schilder who kept him on his current meds. Pt has some medical issues that are being monitored by his docs. These are ongoing issues. Pt reports his son is stable for the past 4 weeks.  Pt is hopeful for his son. He did talk to his wife forming a united front. His 2 sons have agreed to pay their own cell and car insurance. His daughter who has recently moved back home, causes chaos in the home, doesn't pay any bills. Pt is still involved in his men's Bible study and has made friends who have become his support system. Reviewed tx plan with pt and discussed goals.            Suicidal/Homicidal: No/ without plan  Therapist Response: Assessed pt's current functioning and reviewed progress. Assisted pt processing family issues, united front with wife, children that live in the home, Bible study. Assisted pt  processing for management of stressors.  Plan: Return in 4 weeks using CBT  Diagnosis: Axis 1: Bipolar 1 disorder,   Alex [name], LCAS 06/01/18

## 2018-06-03 DIAGNOSIS — H18069 Stromal corneal pigmentations, unspecified eye: Secondary | ICD-10-CM

## 2018-06-03 HISTORY — DX: Stromal corneal pigmentations, unspecified eye: H18.069

## 2018-06-04 ENCOUNTER — Encounter: Payer: Self-pay | Admitting: Oncology

## 2018-06-04 NOTE — H&P (Signed)
Alex [name] is an 62 y.o. male.   Chief Complaint: RIGHT ELBOW PAIN  HPI: Alex [name] is a 62 y/o right hand dominant male who injured the right elbow on 04/28/14. He continued to have elbow pain and on 11/03/17 he underwent a right elbow proximal radial head resection for nonunion. He did well after surgery and completed his outpatient therapy program. He was experiencing continued pain and grinding in the elbow. Discussed the reason and rationale for surgical intervention with debridement of the elbow to reduce the grinding sensation.  Risks versus benefits were discussed.  The patient is here today for surgery.  He denies chest pain, shortness of breath, chills, fever, nausea, vomiting, diarrhea, numbness, or tingling.   Past Medical History:  Diagnosis Date  . Anemia   . Anxiety   . Bipolar I disorder ([place])   . Bleeding ulcer 06/2014  . Bleeding ulcer 2016  . BPH (benign prostatic hypertrophy) with urinary obstruction   . Cataract    LEFT EYE  . Chronic back pain   . Complication of anesthesia POST URINARY RETENTION---  2006 SHOULDER SURGERY MARKED BRADYCARDIA VAGAL RESPONSE NO ISSUE W/ SURGERY AFTER THIS ONE   WITH GENERAL ANESTHESIA, 15 YRS AGO VASOVAGAL REACTION NONE SINCE  . Coronary atherosclerosis CARDIOLOGIST- DR CRENSHAW--  LAST VISIT 01-05-2012 IN EPIC   NON-OBSTRUCTIVE MILD DISEASE  . 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 Depression   . Epicondylitis    right elbow  . GERD (gastroesophageal reflux disease)   . Glaucoma BOTH EYES   RIGHT EYE RADIATION DAMAGE  . Hearing loss    BOTH EARS  . History of chronic prostatitis   . History of hiatal hernia    SMALL  . History of kidney stones   . 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 History of thyroid cancer PRIMARY (NO METS FROM ORBITAL  CANCER)--   IN REMISSION   S/P TOTAL THYROIDECTOMY  , CHEMORADIATION  (ONCOLOGIST -- DR Griffith Citron)  . Hyperlipidemia   . Hypertension   . Nocturia   . OA (osteoarthritis)   . Peripheral vascular disease (HCC)    THORACIC AA 3. 9 CM X 4. 3 CM PER NOV 06-14-17  CHEST CTFOLOWED BY DR CRENSHAW YEARLY FOR  . Positional vertigo    HX OF WITH SINUS INFECTIONS  . Tinnitus    CONSTANT  . Ulnar nerve compression    right elbow  . Urinary hesitancy     Past Surgical History:  Procedure Laterality Date  . CARDIAC CATHETERIZATION  01-16-2006  DR Marcello Moores WALL   MILD CORONARY ATHEROSCLEROSIS/ MID TO DISTAL LAD 40% STENOSIS/ LVF 50-55%  . CARPAL TUNNEL RELEASE Right 11/03/2017   Procedure: RIGHT HAND CARPAL TUNNEL RELEASE;  Surgeon: Iran Planas, MD;  Location: [facility];  Service: Orthopedics;  Laterality: Right;  . COLONSCOPY  2017 LAST DONE   MULTIPLE  . ENDOSCOPY  LAST 2017   MULTIPLE DONE DILATION DONE ALSO  . ESOPHAGOGASTRODUODENOSCOPY (EGD) WITH PROPOFOL N/A 02/24/2018   Procedure: ESOPHAGOGASTRODUODENOSCOPY (EGD) WITH PROPOFOL;  Surgeon: Laurence Spates, MD;  [facility];  Service: Endoscopy;  Laterality: N/A;  . EXCISION RADIAL HEAD Right 11/03/2017   Procedure: RIGHT PROXIMAL RADIUS RADIAL HEAD RESECTION AND JOINT DEBRIDEMENT;  Surgeon: Caralyn Guile,  Josph Macho, MD;  Location: [facility];  Service: Orthopedics;  Laterality: Right;  . KNEE ARTHROSCOPY  05/01/2012   Procedure: ARTHROSCOPY KNEE;  Surgeon: Johnn Hai, MD;  Location: [facility];  Service: Orthopedics;  Laterality: Left;  debridement and removal of loose body  . LEFT ANKLE ARTHROSCOPY W/ DEBRIDEMENT  05-12-2007  . LEFT HYDROCELECTOMY  03-29-2005   AND REPAIR LEFT INGUINAL HERNIA W/ MESH  . MOHS SURGERY  2002   RIGHT ORBITAL CANCER  . NASAL ENDOSCOPY  08-07-2005   RIGHT EPISTAXIS  / POST SEPTOPLASTY  (HX RIGHT ORBITAL CA & S/P RADIATION/ NECROSIS ANTERIOR END OF BOTH INFERIOR  TURBINATES)  . occuloplastic surgery  2002  . PARS PLANA VITRECTOMY  11-06-2004   RIGHT EYE RADIATION RETINOPATHY W/ HEMORRHAGE  . REPAIR UNDESENDED RIGHT TESTICLE / RIGHT INGUINAL HERNIA  AGE 29  . RIGHT ANKLE ARTHROSCOPY W/ EXTENSIVE DEBRIDEMENT  04-05-2008   . RIGHT SHOULDER SURGERY  2006  . RIGHT SUPRAOMOHYOID NECK DISSECTION   03-08-2003   ZONES 1,2,3;   SUBMANDIBULAR MASS / METASTATIC SQUAMOUS CELL CARCINOMA RIGHT NECK  . SAVORY DILATION N/A 02/24/2018   Procedure: SAVORY DILATION;  Surgeon: Laurence Spates, MD;  [facility];  Service: Endoscopy;  Laterality: N/A;  . SEPTOPLASTY  NOV 2006  . SHOULDER ARTHROSCOPY W/ SUBACROMIAL DECOMPRESSION AND DISTAL CLAVICLE EXCISION  10-09-2008   AND DEBRIDEMENT OF RIGHT SHOULDER IMPINGEMENT & [facility] JOINT ARTHRITIS  . SPINE SURGERY  2016   l 3 TO l 4 PLATE AND SCREWS  . TOTAL THYROIDECTOMY  11-03-2001   PAPILLARY THYROID CARCINOMA  . TRANSTHORACIC ECHOCARDIOGRAM  12/ 2012   grade I diastolic dysfunction/ ef 74-25%  . ULNAR NERVE TRANSPOSITION Right 04/28/2014   Procedure: RIGHT ELBOW ULNA NERVE RELEASE TRANSPOSTION AND MEDIAL EPICONDYLAR DEBRIDEMENT AND REPAIR;  Surgeon: Linna Hoff, MD;  Location: [place];  Service: Orthopedics;  Laterality: Right;    Family History  Problem Relation Age of Onset  . Depression Sister   . Rectal cancer Sister   . Lung cancer Brother   . Kidney disease Mother   . Depression Mother   . Stroke Father   . COPD Father   . Hypertension Father   . Prostate cancer Father   . Depression Daughter   . Drug abuse Daughter   . Psychiatric Illness Son    Social History:  reports that he has never smoked. He has never used smokeless tobacco. He reports that he drinks alcohol. He reports that he does not use drugs.  Allergies:  Allergies  Allergen Reactions  . Percocet [Oxycodone-Acetaminophen] Itching    Can take generic.  States only has a problem with percocet brand, also headache   . Vicodin [Hydrocodone-Acetaminophen]     Itching and sleep disruption, can take generic    No medications prior to admission.    No results found for this or any previous visit (from the past 48 hour(s)). No results found.  Review of Systems  HENT: Positive for congestion.    NO RECENT ILLNESSES OR HOSPITALIZATIONS  There were no vitals taken for this visit. Physical Exam  General Appearance:  Alert, cooperative, no distress, appears stated age  Head:  Normocephalic, without obvious abnormality, atraumatic  Eyes:  Pupils equal, conjunctiva/corneas clear,         Throat: Lips, mucosa, and tongue normal; teeth and gums normal  Neck: No visible masses     Lungs:   respirations unlabored  Chest Wall:  No tenderness or deformity  Heart:  Regular rate and rhythm,  Abdomen:   Soft, non-tender,         Extremities: RUE: BLOCK IN PLACE FINGERS WARM WELL PERFUSED  Pulses: 2+ and symmetric  Skin: Skin color, texture, turgor normal, no rashes or lesions     Neurologic: Normal    Assessment RIGHT ELBOW PROXIMAL RADIOULNAR JOINT ARTHROSIS   Plan RIGHT ELBOW PROXIMAL RADIOULNAR JOINT DEBRIDEMENT AND ARTHROPLASTY  WE ARE PLANNING SURGERY FOR YOUR UPPER EXTREMITY. THE RISKS AND BENEFITS OF SURGERY INCLUDE BUT NOT LIMITED TO BLEEDING INFECTION, DAMAGE TO NEARBY NERVES ARTERIES TENDONS, FAILURE OF SURGERY TO ACCOMPLISH ITS INTENDED GOALS, PERSISTENT SYMPTOMS AND NEED FOR FURTHER SURGICAL INTERVENTION. WITH THIS IN MIND WE WILL PROCEED. I HAVE DISCUSSED WITH THE PATIENT THE PRE AND POSTOPERATIVE REGIMEN AND THE DOS AND DON'TS. PT VOICED UNDERSTANDING AND INFORMED CONSENT SIGNED.  R/B/A DISCUSSED WITH PT IN OFFICE.  PT VOICED UNDERSTANDING OF PLAN CONSENT SIGNED DAY OF SURGERY PT SEEN AND EXAMINED PRIOR TO OPERATIVE PROCEDURE/DAY OF SURGERY SITE MARKED. QUESTIONS ANSWERED WILL GO HOME FOLLOWING SURGERY  DR. Nilani Hugill Caralyn Guile MD    Brynda Peon 06/04/2018, 8:40 AM

## 2018-06-07 NOTE — Progress Notes (Signed)
[place]  [phone] [phone]     ID: Alex [name] DOB: 07/14/1956  MR#: [id number]  PNT#:[id number]  Patient Care Team: Cassandria Anger, MD as PCP - General Stanford Breed, Denice Bors, MD as PCP - Cardiology (Cardiology) Magrinat, Virgie Dad, MD (Hematology and Oncology) Franchot Gallo, MD as Attending Physician (Urology) Jerrell Belfast, MD (Otolaryngology) Stanford Breed Denice Bors, MD (Cardiology) Laurence Spates, MD as Consulting Physician (Gastroenterology) Pablo Lawrence (Allergy and Immunology) Javier Glazier, MD as Consulting Physician (Pulmonary Disease) OTHER MD:  CHIEF COMPLAINT: Periorbital squamous cell carcinoma; thyroid cancer; iron deficiency anemia  CURRENT TREATMENT: Observation   INTERVAL HISTORY: Alex [name] returns today for 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 He states that his insurance, [employer], hasn't approved his scans (MRI Orbits and CT neck) recently.  He is very concerned that he may be developing a recurrence  He has had an a [place] of his liver completed on 06/02/2018 that showed: Intrahepatic portosystemic shunt measuring 1.4 cm in segment 6 of the liver. 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   He has had several imaging studies for his liver regarding a possible aneurysm and he was referred to [facility] with Dr. Merrilee Jansky at [facility]. He would like to maintain follow up at [facility] with Dr. Merrilee Jansky for this.  Also [facility] recommended an MRI of his pancreas for follow-up of his cystic structure.  Alex [name] is going to contact Dr. Polly Cobia office to try to get that arranged before the end of the year.   He has been seen by a [facility], Dr. Hinton Lovely due to spontaneous subconjunctival hemorrhage and Dr. Hinton Lovely recommended APTT and PT/INR to be done to make sure there is no obvious coagulopathy.  Those labs were done today.    He has  elbow surgery coming up with Dr. Caralyn Guile on this coming Monday, 06/15/2018.   He has had an esophageal dilation procedure completed by Dr. Oletta Lamas, which he thinks he will need to repeat in the near future.   REVIEW OF SYSTEMS: [name] reports that he has been fatigued as well as having increased life stressors. He notes associated tinnitus, left eye redness, and right eye redness (chronic). He denies issues with swallowing. His wife is doing well also. He notes that his son, Alex [name], appears stable now. He denies unusual headaches, visual changes, nausea, vomiting, or dizziness. There has been no unusual cough, phlegm production, or pleurisy. This been no change in bowel or bladder habits. He denies unexplained fatigue or unexplained weight loss, rash, or fever. A detailed review of systems was otherwise stable.    PAST MEDICAL HISTORY: Past Medical History:  Diagnosis Date  . Anemia   . Anxiety   . Bipolar I disorder ([place])   . Bleeding ulcer 06/2014  . Bleeding ulcer 2016  . BPH (benign prostatic hypertrophy) with urinary obstruction   . Cataract    LEFT EYE  . Chronic back pain   . Complication of anesthesia POST URINARY RETENTION---  2006 SHOULDER SURGERY MARKED BRADYCARDIA VAGAL RESPONSE NO ISSUE W/ SURGERY AFTER THIS ONE   WITH GENERAL ANESTHESIA, 15 YRS AGO VASOVAGAL REACTION NONE SINCE  . Coronary atherosclerosis CARDIOLOGIST- DR CRENSHAW--  LAST VISIT 01-05-2012 IN EPIC   NON-OBSTRUCTIVE MILD DISEASE  . 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 Depression   . Epicondylitis    right elbow  . GERD (gastroesophageal reflux disease)   .  Glaucoma BOTH EYES   RIGHT EYE RADIATION DAMAGE  . Hearing loss    BOTH EARS  . History of chronic prostatitis   . History of hiatal hernia    SMALL  . History of kidney stones   . History of orbital cancer 2002  RIGHT EYE SQUAMOUS CELL  S/P  MOH'S SURG AND CHEMO RADIATION---  ONCOLOIST  DR MAGRINOT  (IN REMISSION)   W/ METS TO NECK    2004  ---  S/P  NECK DISSECTION AND RADIATION  . History of thyroid cancer PRIMARY (NO METS FROM ORBITAL CANCER)--   IN REMISSION   S/P TOTAL THYROIDECTOMY  , CHEMORADIATION  (ONCOLOGIST -- DR Griffith Citron)  . Hyperlipidemia   . Hypertension   . Nocturia   . OA (osteoarthritis)   . Peripheral vascular disease (H[facility])    THORACIC AA 3. 9 CM X 4. 3 CM PER NOV 06-14-17  CHEST CTFOLOWED BY DR CRENSHAW YEARLY FOR  . Positional vertigo    HX OF WITH SINUS INFECTIONS  . Tinnitus    CONSTANT  . Ulnar nerve compression    right elbow  . Urinary hesitancy     PAST SURGICAL HISTORY: Past Surgical History:  Procedure Laterality Date  . CARDIAC CATHETERIZATION  01-16-2006  DR Marcello Moores WALL   MILD CORONARY ATHEROSCLEROSIS/ MID TO DISTAL LAD 40% STENOSIS/ LVF 50-55%  . CARPAL TUNNEL RELEASE Right 11/03/2017   Procedure: RIGHT HAND CARPAL TUNNEL RELEASE;  Surgeon: Iran Planas, MD;  Location: [facility];  Service: Orthopedics;  Laterality: Right;  . COLONSCOPY  2017 LAST DONE   MULTIPLE  . ENDOSCOPY  LAST 2017   MULTIPLE DONE DILATION DONE ALSO  . ESOPHAGOGASTRODUODENOSCOPY (EGD) WITH PROPOFOL N/A 02/24/2018   Procedure: ESOPHAGOGASTRODUODENOSCOPY (EGD) WITH PROPOFOL;  Surgeon: Laurence Spates, MD;  Location: [facility];  Service: Endoscopy;  Laterality: N/A;  . EXCISION RADIAL HEAD Right 11/03/2017   Procedure: RIGHT PROXIMAL RADIUS RADIAL HEAD RESECTION AND JOINT DEBRIDEMENT;  Surgeon: Iran Planas, MD;  Location: [facility];  Service: Orthopedics;  Laterality: Right;  . KNEE ARTHROSCOPY  05/01/2012   Procedure: ARTHROSCOPY KNEE;  Surgeon: Johnn Hai, MD;  Location: [facility];  Service: Orthopedics;  Laterality: Left;  debridement and removal of loose body  . LEFT ANKLE ARTHROSCOPY W/ DEBRIDEMENT  05-12-2007  . LEFT HYDROCELECTOMY  03-29-2005   AND REPAIR LEFT INGUINAL HERNIA W/ MESH  . MOHS SURGERY  2002   RIGHT ORBITAL CANCER  . NASAL ENDOSCOPY   08-07-2005   RIGHT EPISTAXIS  / POST SEPTOPLASTY  (HX RIGHT ORBITAL CA & S/P RADIATION/ NECROSIS ANTERIOR END OF BOTH INFERIOR TURBINATES)  . occuloplastic surgery  2002  . PARS PLANA VITRECTOMY  11-06-2004   RIGHT EYE RADIATION RETINOPATHY W/ HEMORRHAGE  . REPAIR UNDESENDED RIGHT TESTICLE / RIGHT INGUINAL HERNIA  AGE 67  . RIGHT ANKLE ARTHROSCOPY W/ EXTENSIVE DEBRIDEMENT  04-05-2008   . RIGHT SHOULDER SURGERY  2006  . RIGHT SUPRAOMOHYOID NECK DISSECTION   03-08-2003   ZONES 1,2,3;   SUBMANDIBULAR MASS / METASTATIC SQUAMOUS CELL CARCINOMA RIGHT NECK  . SAVORY DILATION N/A 02/24/2018   Procedure: SAVORY DILATION;  Surgeon: Laurence Spates, MD;  Location: [facility];  Service: Endoscopy;  Laterality: N/A;  . SEPTOPLASTY  NOV 2006  . SHOULDER ARTHROSCOPY W/ SUBACROMIAL DECOMPRESSION AND DISTAL CLAVICLE EXCISION  10-09-2008   AND DEBRIDEMENT OF RIGHT SHOULDER IMPINGEMENT & [facility] JOINT ARTHRITIS  . SPINE SURGERY  2016   l 3 TO  l 4 PLATE AND SCREWS  . TOTAL THYROIDECTOMY  11-03-2001   PAPILLARY THYROID CARCINOMA  . TRANSTHORACIC ECHOCARDIOGRAM  12/ 2012   grade I diastolic dysfunction/ ef 97-67%  . ULNAR NERVE TRANSPOSITION Right 04/28/2014   Procedure: RIGHT ELBOW ULNA NERVE RELEASE TRANSPOSTION AND MEDIAL EPICONDYLAR DEBRIDEMENT AND REPAIR;  Surgeon: Linna Hoff, MD;  Location: [place];  Service: Orthopedics;  Laterality: Right;    FAMILY HISTORY Family History  Problem Relation Age of Onset  . Depression Sister   . Rectal cancer Sister   . Lung cancer Brother   . Kidney disease Mother   . Depression Mother   . Stroke Father   . COPD Father   . Hypertension Father   . Prostate cancer Father   . Depression Daughter   . Drug abuse Daughter   . Psychiatric Illness Son     SOCIAL HISTORY:  Alex [name] is disabled secondary to his multiple medical problems. His wife Alex [name] works for [employer]. 2 of his younger children are still home. One of his children unfortunately suffers  from paraphilia . The ffourth child is "our problem daughter" and she is also back home with the family.    ADVANCED DIRECTIVES: Not in place   HEALTH MAINTENANCE: Social History   Tobacco Use  . Smoking status: Never Smoker  . Smokeless tobacco: Never Used  Substance Use Topics  . Alcohol use: Yes    Alcohol/week: 0.0 standard drinks    Comment: 1-2 glasses per month  . Drug use: No     Allergies  Allergen Reactions  . Percocet [Oxycodone-Acetaminophen] Itching    Can take generic.  States only has a problem with percocet brand, also headache  . Vicodin [Hydrocodone-Acetaminophen]     Itching and sleep disruption, can take generic    Current Outpatient Medications  Medication Sig Dispense Refill  . amLODipine (NORVASC) 10 MG tablet Take 1 tablet (10 mg total) by mouth daily. 90 tablet 4  . aspirin EC 81 MG tablet Take 1 tablet by mouth daily.    [name] atorvastatin (LIPITOR) 80 MG tablet Take 1 tablet (80 mg total) by mouth at bedtime. 90 tablet 3  . AXIRON 30 MG/ACT SOLN Place 2 Act onto the skin daily.    . budesonide-formoterol (SYMBICORT) 80-4.5 MCG/ACT inhaler Inhale 2 puffs into the lungs 2 (two) times daily. (Patient taking differently: Inhale 2 puffs into the lungs 2 (two) times daily as needed (Bronchitis). ) 1 Inhaler 0  . Coenzyme Q10 (CO Q 10 PO) Take 1 capsule by mouth daily.     . Immune Globulin, Human, (HIZENTRA) 4 GM/20ML SOLN Inject into the skin once a week. wednesday    . LamoTRIgine 300 MG TB24 24 hour tablet Take 1 tablet (300 mg total) by mouth daily. 90 tablet 0  . latanoprost (XALATAN) 0.005 % ophthalmic solution 1 DROP EACH EYE AT NIGHT    . levalbuterol (XOPENEX HFA) 45 MCG/ACT inhaler Inhale 2 puffs into the lungs every 6 (six) hours as needed for wheezing. 1 Inhaler 5  . levothyroxine (SYNTHROID, LEVOTHROID) 150 MCG tablet TAKE 1 TABLET BY MOUTH EVERY DAY (Patient taking differently: TAKE 1 TABLET BY MOUTH EVERY DAY AT BEDTIME) 90 tablet 3  .  lidocaine-prilocaine (EMLA) cream Apply 1 application topically as needed.     [name] LORazepam (ATIVAN) 0.5 MG tablet Take 1 tab daily as needed and 1 tab at bed time 60 tablet 2  . LUTEIN PO Take 1 tablet by mouth  daily.     . meloxicam (MOBIC) 15 MG tablet Take 1 tablet (15 mg total) by mouth daily as needed. 30 tablet 2  . OMEGA-3 KRILL OIL PO Take 1 capsule by mouth daily.     [name] Respiratory Therapy Supplies (FLUTTER) DEVI Use as directed 1 each 0  . sildenafil (VIAGRA) 25 MG tablet Take 5 mg by mouth daily as needed (BPH).     [name] Spacer/Aero-Holding Chambers (AEROCHAMBER MV) inhaler Use as instructed 1 each 0  . sucralfate (CARAFATE) 1 G tablet Take 1 g by mouth daily as needed (ACID REFLUX).   3  . telmisartan (MICARDIS) 80 MG tablet TAKE 1 TABLET (80 MG TOTAL) BY MOUTH DAILY. 90 tablet 3  . zolpidem (AMBIEN) 10 MG tablet Take 1 tablet (10 mg total) by mouth at bedtime as needed. for sleep 30 tablet 1   No current facility-administered medications for this visit.     OBJECTIVE: Middle-aged white male who appears stated age  62:   06/08/18 1428  BP: (!) 142/90  Pulse: 80  Resp: 18  Temp: 98.3 F (36.8 C)  SpO2: 99%     Body mass index is 24.29 kg/m.    ECOG FS:1 - Symptomatic but completely ambulatory  Sclerae injected bilaterally, with the left eye conjunctival hemorrhage present; EOMs are intact, with pupils equal round and reactive Oropharynx clear and dry No cervical or supraclavicular adenopathy: No palpable thyromegaly Lungs no rales or rhonchi Heart regular rate and rhythm Abd soft, nontender, positive bowel sounds, no splenomegaly by palpation MSK no focal spinal tenderness, no upper extremity lymphedema Neuro: nonfocal, well oriented, anxious affect  LAB RESULTS:  CMP     Component Value Date/Time   NA 141 05/12/2018 1032   NA 139 06/27/2017 0950   K 4.7 05/12/2018 1032   K 4.0 06/27/2017 0950   CL 102 05/12/2018 1032   CO2 22 05/12/2018 1032   CO2 23  06/27/2017 0950   GLUCOSE 84 05/12/2018 1032   GLUCOSE 97 11/17/2017 1510   GLUCOSE 93 06/27/2017 0950   GLUCOSE 95 06/04/2006 0825   BUN 15 05/12/2018 1032   BUN 19.2 06/27/2017 0950   CREATININE 1.18 05/12/2018 1032   CREATININE 1.1 06/27/2017 0950   CALCIUM 9.2 05/12/2018 1032   CALCIUM 8.8 06/27/2017 0950   PROT 6.9 05/12/2018 1032   PROT 6.9 06/27/2017 0950   ALBUMIN 4.8 05/12/2018 1032   ALBUMIN 4.1 06/27/2017 0950   AST 36 05/12/2018 1032   AST 26 06/27/2017 0950   ALT 40 05/12/2018 1032   ALT 40 06/27/2017 0950   ALKPHOS 77 05/12/2018 1032   ALKPHOS 74 06/27/2017 0950   BILITOT 0.8 05/12/2018 1032   BILITOT 1.05 06/27/2017 0950   GFRNONAA 65 05/12/2018 1032   GFRAA 75 05/12/2018 1032    INo results found for: SPEP, UPEP  Lab Results  Component Value Date   WBC 10.0 06/08/2018   NEUTROABS 6.5 06/08/2018   HGB 17.5 (H) 06/08/2018   HCT 52.5 (H) 06/08/2018   MCV 80.9 06/08/2018   PLT 226 06/08/2018      Chemistry      Component Value Date/Time   NA 141 05/12/2018 1032   NA 139 06/27/2017 0950   K 4.7 05/12/2018 1032   K 4.0 06/27/2017 0950   CL 102 05/12/2018 1032   CO2 22 05/12/2018 1032   CO2 23 06/27/2017 0950   BUN 15 05/12/2018 1032   BUN 19.2 06/27/2017 0950   CREATININE 1.18  05/12/2018 1032   CREATININE 1.1 06/27/2017 0950      Component Value Date/Time   CALCIUM 9.2 05/12/2018 1032   CALCIUM 8.8 06/27/2017 0950   ALKPHOS 77 05/12/2018 1032   ALKPHOS 74 06/27/2017 0950   AST 36 05/12/2018 1032   AST 26 06/27/2017 0950   ALT 40 05/12/2018 1032   ALT 40 06/27/2017 0950   BILITOT 0.8 05/12/2018 1032   BILITOT 1.05 06/27/2017 0950       No results found for: LABCA2  No components found for: LABCA125  Recent Labs  Lab 06/08/18 1417  INR 0.87    Urinalysis    Component Value Date/Time   COLORURINE YELLOW 01/22/2017 1056   APPEARANCEUR CLEAR 01/22/2017 1056   LABSPEC 1.025 01/22/2017 1056   PHURINE 6.0 01/22/2017 1056    GLUCOSEU NEGATIVE 01/22/2017 1056   HGBUR SMALL (A) 01/22/2017 1056   BILIRUBINUR NEGATIVE 01/22/2017 1056   KETONESUR NEGATIVE 01/22/2017 1056   PROTEINUR NEGATIVE 07/23/2016 1111   UROBILINOGEN 0.2 01/22/2017 1056   NITRITE NEGATIVE 01/22/2017 1056   LEUKOCYTESUR NEGATIVE 01/22/2017 1056    STUDIES: No results found.  ASSESSMENT: 62 y.o. Gideon man  (1) status post right periorbital biopsy of the squamous cell carcinoma in November 2002, status post resection and 64 gray of radiation completed February 2003  (2) recurrence to the right perirbital lymph node drainage area, status post right supraomohyoid neck dissection July 2004 with 1 of 8 lymph nodes positive  (a) completed adjuvant radiation October 2004, 49 Gy to the upper neck, given with sensitizing Erbitux and cisplatin  (3) status post thyroidectomy for papillary thyroid carcinoma, with postoperative iodine-131 May 2003  (a) on chronic Synthroid supplementation, most recent TSH 1.73 (06/06/2014)  (4) likely alpha thalassemia trait with baseline MCV 79-81 in setting of normal hemoglobin  (5) iron deficiency secondary to gastritis/esophagitis, s/p feraheme, last dose 10/27/2015  PLAN: Alex [name] is now 17 years out from initial diagnosis of his periorbital squamous cell carcinoma.  He does have a left conjunctival hemorrhage today and injected sclerae bilaterally but full ocular motions.  He has not had a periorbital MRI in some time.  It would be useful if we document at that that area looks fine.  Similarly there is no obvious recurrence of his thyroid carcinoma but it would be good to document that and I have put in for a CT of the soft tissues of the neck to be done in the next couple of weeks.  His retinal specialist suggested some coagulation studies which were done today.  I expect them to be in the normal range  His liver abnormalities and pancreatic cystic concerns are going to be followed at [facility] under Dr.  Merrilee Jansky.  Alex [name] has excellent primary care through Dr. Alain Marion.  He will return to see me in a year, with labs a few days before the visit.  Alex [name] knows to call for any other issues that may develop before then.  Magrinat, Virgie Dad, MD  06/08/18 3:00 PM Medical Oncology and Hematology [facility] [address] [place], [place] [address] Tel. [phone]    Fax. [phone]    I, Soijett Blue am acting as scribe for Dr. Sarajane Jews C. Magrinat.  I, Lurline Del MD, have reviewed the above documentation for accuracy and completeness, and I agree with the above.

## 2018-06-08 ENCOUNTER — Inpatient Hospital Stay: Payer: Managed Care, Other (non HMO)

## 2018-06-08 ENCOUNTER — Telehealth: Payer: Self-pay | Admitting: Oncology

## 2018-06-08 ENCOUNTER — Inpatient Hospital Stay: Payer: Managed Care, Other (non HMO) | Attending: Oncology | Admitting: Oncology

## 2018-06-08 ENCOUNTER — Other Ambulatory Visit: Payer: Self-pay | Admitting: *Deleted

## 2018-06-08 VITALS — BP 142/90 | HR 80 | Temp 98.3°F | Resp 18 | Ht 72.0 in | Wt 179.1 lb

## 2018-06-08 DIAGNOSIS — D508 Other iron deficiency anemias: Secondary | ICD-10-CM

## 2018-06-08 DIAGNOSIS — Z85850 Personal history of malignant neoplasm of thyroid: Secondary | ICD-10-CM | POA: Diagnosis not present

## 2018-06-08 DIAGNOSIS — C73 Malignant neoplasm of thyroid gland: Secondary | ICD-10-CM

## 2018-06-08 DIAGNOSIS — K2970 Gastritis, unspecified, without bleeding: Secondary | ICD-10-CM

## 2018-06-08 DIAGNOSIS — D509 Iron deficiency anemia, unspecified: Secondary | ICD-10-CM

## 2018-06-08 DIAGNOSIS — K209 Esophagitis, unspecified: Secondary | ICD-10-CM

## 2018-06-08 DIAGNOSIS — Z923 Personal history of irradiation: Secondary | ICD-10-CM

## 2018-06-08 DIAGNOSIS — Z79899 Other long term (current) drug therapy: Secondary | ICD-10-CM | POA: Diagnosis not present

## 2018-06-08 DIAGNOSIS — Z85840 Personal history of malignant neoplasm of eye: Secondary | ICD-10-CM

## 2018-06-08 DIAGNOSIS — C6960 Malignant neoplasm of unspecified orbit: Secondary | ICD-10-CM

## 2018-06-08 DIAGNOSIS — T148XXA Other injury of unspecified body region, initial encounter: Secondary | ICD-10-CM

## 2018-06-08 LAB — CMP (CANCER CENTER ONLY)
ALT: 39 U/L (ref 0–44)
AST: 31 U/L (ref 15–41)
Albumin: 4.1 g/dL (ref 3.5–5.0)
Alkaline Phosphatase: 77 U/L (ref 38–126)
Anion gap: 9 (ref 5–15)
BUN: 20 mg/dL (ref 8–23)
CO2: 27 mmol/L (ref 22–32)
Calcium: 9 mg/dL (ref 8.9–10.3)
Chloride: 102 mmol/L (ref 98–111)
Creatinine: 1.27 mg/dL — ABNORMAL HIGH (ref 0.61–1.24)
GFR, Est AFR Am: >60 mL/min (ref >60)
GFR, Estimated: 58 mL/min — ABNORMAL LOW (ref >60)
Glucose, Bld: 104 mg/dL — ABNORMAL HIGH (ref 70–99)
Potassium: 4.2 mmol/L (ref 3.5–5.1)
Sodium: 138 mmol/L (ref 135–145)
Total Bilirubin: 0.7 mg/dL (ref 0.3–1.2)
Total Protein: 7.3 g/dL (ref 6.5–8.1)

## 2018-06-08 LAB — CBC WITH DIFFERENTIAL (CANCER CENTER ONLY)
Abs Immature Granulocytes: 0.03 10*3/uL (ref 0.00–0.07)
Basophils Absolute: 0.1 10*3/uL (ref 0.0–0.1)
Basophils Relative: 1 %
Eosinophils Absolute: 0.3 10*3/uL (ref 0.0–0.5)
Eosinophils Relative: 3 %
HCT: 52.5 % — ABNORMAL HIGH (ref 39.0–52.0)
Hemoglobin: 17.5 g/dL — ABNORMAL HIGH (ref 13.0–17.0)
Immature Granulocytes: 0 %
Lymphocytes Relative: 19 %
Lymphs Abs: 1.9 10*3/uL (ref 0.7–4.0)
MCH: 27 pg (ref 26.0–34.0)
MCHC: 33.3 g/dL (ref 30.0–36.0)
MCV: 80.9 fL (ref 80.0–100.0)
Monocytes Absolute: 1.2 10*3/uL — ABNORMAL HIGH (ref 0.1–1.0)
Monocytes Relative: 12 %
Neutro Abs: 6.5 10*3/uL (ref 1.7–7.7)
Neutrophils Relative %: 65 %
Platelet Count: 226 10*3/uL (ref 150–400)
RBC: 6.49 MIL/uL — ABNORMAL HIGH (ref 4.22–5.81)
RDW: 15.8 % — ABNORMAL HIGH (ref 11.5–15.5)
WBC Count: 10 10*3/uL (ref 4.0–10.5)
nRBC: 0 % (ref 0.0–0.2)

## 2018-06-08 LAB — APTT: aPTT: 30 seconds (ref 24–36)

## 2018-06-08 LAB — PROTIME-INR
INR: 0.87
Prothrombin Time: 11.8 seconds (ref 11.4–15.2)

## 2018-06-08 NOTE — Telephone Encounter (Signed)
Gave avs  ° °

## 2018-06-09 ENCOUNTER — Telehealth: Payer: Self-pay | Admitting: *Deleted

## 2018-06-09 ENCOUNTER — Other Ambulatory Visit: Payer: Self-pay | Admitting: *Deleted

## 2018-06-09 NOTE — Telephone Encounter (Signed)
Ok to hold ASA and for surgery [name]

## 2018-06-09 NOTE — Telephone Encounter (Signed)
   Primary Cardiologist: [name], MD  Chart reviewed as part of pre-operative protocol coverage. Dr. Stanford Breed, can you give recommendation of clearance and ASA? You have seen the patient recently.   Please forward your response to P CV DIV PREOP.   Thank you  Leanor Kail, PA 06/09/2018, 2:00 PM

## 2018-06-09 NOTE — Telephone Encounter (Signed)
   [facility] Pre-operative Risk Assessment    Request for surgical clearance:  1. What type of surgery is being performed? RIGHT ELBOW PROXIMAL  RADIOULNAR JOINT DEB TO  ARTHROPLASTY   2. When is this surgery scheduled? NOV 5,2019  3. What type of clearance is required (medical clearance vs. Pharmacy clearance to hold med vs. Both)? MEDICAL  4. Are there any medications that need to be held prior to surgery and how long?ASPIRIN  5. Practice name and name of physician performing surgery?  [facility]  ; DR Caralyn Guile   6. What is your office phone number [place]   7.   What is your office fax number [place]  8.   Anesthesia type (None, local, MAC, general) ?  BLOCK WITH SEDATION   Raiford Simmonds 06/09/2018, 1:39 PM  _________________________________________________________________   (provider comments below)

## 2018-06-10 ENCOUNTER — Encounter (HOSPITAL_BASED_OUTPATIENT_CLINIC_OR_DEPARTMENT_OTHER): Payer: Self-pay

## 2018-06-10 ENCOUNTER — Other Ambulatory Visit: Payer: Self-pay

## 2018-06-10 NOTE — Pre-Procedure Instructions (Signed)
Cardiac clearance 06/09/2018 from Dr. Stanford Breed in chart.

## 2018-06-10 NOTE — Progress Notes (Signed)
Cardiac clearance Dr. Stanford Breed in chart Spoke with: Broadus John NPO:  No food after midnight/Clear liquids until 7:30AM day of surgery Arrival time: 1130AM Labs: Istat 8, (EKG, MRA, Chest CT, Last office visit note 05/15/2018, CT liver 06/22/2017 chart/epic) AM medications: Lamotrigine, Lorazepam if needed, Bring Asthma Inhalers Pre op orders: Yes Ride home:  Marcie Bal (wife) [phone]

## 2018-06-11 ENCOUNTER — Other Ambulatory Visit: Payer: Self-pay | Admitting: Cardiology

## 2018-06-12 ENCOUNTER — Encounter: Payer: Self-pay | Admitting: Oncology

## 2018-06-12 ENCOUNTER — Ambulatory Visit
Admission: RE | Admit: 2018-06-12 | Discharge: 2018-06-12 | Disposition: A | Discharge status: Home / Self Care | Payer: Managed Care, Other (non HMO) | Source: Ambulatory Visit | Attending: Cardiology | Admitting: Cardiology

## 2018-06-12 DIAGNOSIS — I712 Thoracic aortic aneurysm, without rupture, unspecified: Secondary | ICD-10-CM

## 2018-06-12 MED ORDER — GADOBENATE DIMEGLUMINE 529 MG/ML IV SOLN
15.0000 mL | Freq: Once | INTRAVENOUS | Status: AC | PRN
  Administered 2018-06-12: 15 mL via INTRAVENOUS

## 2018-06-15 ENCOUNTER — Encounter (HOSPITAL_BASED_OUTPATIENT_CLINIC_OR_DEPARTMENT_OTHER): Payer: Self-pay | Admitting: Certified Registered"

## 2018-06-15 ENCOUNTER — Encounter (HOSPITAL_BASED_OUTPATIENT_CLINIC_OR_DEPARTMENT_OTHER)
Admission: RE | Disposition: A | Discharge status: Home / Self Care | Payer: Self-pay | Source: Ambulatory Visit | Attending: Orthopedic Surgery

## 2018-06-15 ENCOUNTER — Ambulatory Visit (HOSPITAL_COMMUNITY): Payer: Self-pay | Admitting: Licensed Clinical Social Worker

## 2018-06-15 ENCOUNTER — Ambulatory Visit (HOSPITAL_BASED_OUTPATIENT_CLINIC_OR_DEPARTMENT_OTHER): Payer: Managed Care, Other (non HMO) | Admitting: Anesthesiology

## 2018-06-15 ENCOUNTER — Other Ambulatory Visit: Payer: Self-pay

## 2018-06-15 ENCOUNTER — Ambulatory Visit (HOSPITAL_BASED_OUTPATIENT_CLINIC_OR_DEPARTMENT_OTHER)
Admission: RE | Admit: 2018-06-15 | Discharge: 2018-06-15 | Disposition: A | Discharge status: Home / Self Care | Payer: Managed Care, Other (non HMO) | Source: Ambulatory Visit | Attending: Orthopedic Surgery | Admitting: Orthopedic Surgery

## 2018-06-15 DIAGNOSIS — E890 Postprocedural hypothyroidism: Secondary | ICD-10-CM | POA: Diagnosis not present

## 2018-06-15 DIAGNOSIS — E785 Hyperlipidemia, unspecified: Secondary | ICD-10-CM | POA: Insufficient documentation

## 2018-06-15 DIAGNOSIS — M549 Dorsalgia, unspecified: Secondary | ICD-10-CM | POA: Diagnosis not present

## 2018-06-15 DIAGNOSIS — K219 Gastro-esophageal reflux disease without esophagitis: Secondary | ICD-10-CM | POA: Insufficient documentation

## 2018-06-15 DIAGNOSIS — M19121 Post-traumatic osteoarthritis, right elbow: Secondary | ICD-10-CM | POA: Diagnosis not present

## 2018-06-15 DIAGNOSIS — Z7982 Long term (current) use of aspirin: Secondary | ICD-10-CM | POA: Diagnosis not present

## 2018-06-15 DIAGNOSIS — G8918 Other acute postprocedural pain: Secondary | ICD-10-CM | POA: Insufficient documentation

## 2018-06-15 DIAGNOSIS — I739 Peripheral vascular disease, unspecified: Secondary | ICD-10-CM | POA: Insufficient documentation

## 2018-06-15 DIAGNOSIS — D839 Common variable immunodeficiency, unspecified: Secondary | ICD-10-CM | POA: Insufficient documentation

## 2018-06-15 DIAGNOSIS — I10 Essential (primary) hypertension: Secondary | ICD-10-CM | POA: Insufficient documentation

## 2018-06-15 DIAGNOSIS — J449 Chronic obstructive pulmonary disease, unspecified: Secondary | ICD-10-CM | POA: Insufficient documentation

## 2018-06-15 DIAGNOSIS — Z85850 Personal history of malignant neoplasm of thyroid: Secondary | ICD-10-CM | POA: Diagnosis not present

## 2018-06-15 DIAGNOSIS — T1490XS Injury, unspecified, sequela: Secondary | ICD-10-CM | POA: Insufficient documentation

## 2018-06-15 DIAGNOSIS — M25521 Pain in right elbow: Secondary | ICD-10-CM | POA: Insufficient documentation

## 2018-06-15 DIAGNOSIS — Z79899 Other long term (current) drug therapy: Secondary | ICD-10-CM | POA: Diagnosis not present

## 2018-06-15 DIAGNOSIS — Z885 Allergy status to narcotic agent status: Secondary | ICD-10-CM | POA: Diagnosis not present

## 2018-06-15 DIAGNOSIS — M159 Polyosteoarthritis, unspecified: Secondary | ICD-10-CM

## 2018-06-15 DIAGNOSIS — M150 Primary generalized (osteo)arthritis: Secondary | ICD-10-CM

## 2018-06-15 DIAGNOSIS — F419 Anxiety disorder, unspecified: Secondary | ICD-10-CM | POA: Insufficient documentation

## 2018-06-15 DIAGNOSIS — G8929 Other chronic pain: Secondary | ICD-10-CM | POA: Insufficient documentation

## 2018-06-15 DIAGNOSIS — F319 Bipolar disorder, unspecified: Secondary | ICD-10-CM | POA: Insufficient documentation

## 2018-06-15 DIAGNOSIS — M8949 Other hypertrophic osteoarthropathy, multiple sites: Secondary | ICD-10-CM

## 2018-06-15 HISTORY — DX: Thoracic aortic aneurysm, without rupture: I71.2

## 2018-06-15 HISTORY — DX: Unsteadiness on feet: R26.81

## 2018-06-15 HISTORY — DX: Stromal corneal pigmentations, unspecified eye: H18.069

## 2018-06-15 HISTORY — DX: Presence of spectacles and contact lenses: Z97.3

## 2018-06-15 HISTORY — DX: Other specified diseases of liver: K76.89

## 2018-06-15 HISTORY — DX: Cyst of pancreas: K86.2

## 2018-06-15 HISTORY — PX: RADIAL HEAD ARTHROPLASTY: SHX6044

## 2018-06-15 HISTORY — DX: Personal history of other diseases of the respiratory system: Z87.09

## 2018-06-15 HISTORY — DX: Unspecified macular degeneration: H35.30

## 2018-06-15 HISTORY — DX: Displaced fracture of head of unspecified radius, initial encounter for closed fracture: S52.123A

## 2018-06-15 LAB — POCT I-STAT 4, (NA,K, GLUC, HGB,HCT)
Glucose, Bld: 101 mg/dL — ABNORMAL HIGH (ref 70–99)
HCT: 53 % — ABNORMAL HIGH (ref 39.0–52.0)
Hemoglobin: 18 g/dL — ABNORMAL HIGH (ref 13.0–17.0)
Potassium: 3.9 mmol/L (ref 3.5–5.1)
Sodium: 139 mmol/L (ref 135–145)

## 2018-06-15 LAB — POCT I-STAT, CHEM 8
BUN: 22 mg/dL (ref 8–23)
Calcium, Ion: 1.13 mmol/L — ABNORMAL LOW (ref 1.15–1.40)
Chloride: 102 mmol/L (ref 98–111)
Creatinine, Ser: 0.9 mg/dL (ref 0.61–1.24)
Glucose, Bld: 101 mg/dL — ABNORMAL HIGH (ref 70–99)
HCT: 51 % (ref 39.0–52.0)
Hemoglobin: 17.3 g/dL — ABNORMAL HIGH (ref 13.0–17.0)
Potassium: 3.9 mmol/L (ref 3.5–5.1)
Sodium: 139 mmol/L (ref 135–145)
TCO2: 25 mmol/L (ref 22–32)

## 2018-06-15 SURGERY — ARTHROPLASTY, RADIUS, HEAD
Anesthesia: Monitor Anesthesia Care | Site: Elbow | Laterality: Right

## 2018-06-15 MED ORDER — PROPOFOL 10 MG/ML IV BOLUS
INTRAVENOUS | Status: DC | PRN
  Administered 2018-06-15: 40 mg via INTRAVENOUS

## 2018-06-15 MED ORDER — FENTANYL CITRATE (PF) 100 MCG/2ML IJ SOLN
25.0000 ug | INTRAMUSCULAR | Status: DC | PRN
  Filled 2018-06-15: qty 1

## 2018-06-15 MED ORDER — PROPOFOL 500 MG/50ML IV EMUL
INTRAVENOUS | Status: AC
  Filled 2018-06-15: qty 50

## 2018-06-15 MED ORDER — CEFAZOLIN SODIUM-DEXTROSE 2-4 GM/100ML-% IV SOLN
2.0000 g | INTRAVENOUS | Status: DC
  Filled 2018-06-15: qty 100

## 2018-06-15 MED ORDER — ROPIVACAINE HCL 5 MG/ML IJ SOLN
INTRAMUSCULAR | Status: DC | PRN
  Administered 2018-06-15: 30 mL via PERINEURAL

## 2018-06-15 MED ORDER — FENTANYL CITRATE (PF) 100 MCG/2ML IJ SOLN
INTRAMUSCULAR | Status: AC
  Filled 2018-06-15: qty 2

## 2018-06-15 MED ORDER — ONDANSETRON HCL 4 MG/2ML IJ SOLN
4.0000 mg | Freq: Once | INTRAMUSCULAR | Status: DC | PRN
  Filled 2018-06-15: qty 2

## 2018-06-15 MED ORDER — FENTANYL CITRATE (PF) 100 MCG/2ML IJ SOLN
50.0000 ug | Freq: Once | INTRAMUSCULAR | Status: AC
  Administered 2018-06-15: 50 ug via INTRAVENOUS
  Filled 2018-06-15: qty 1

## 2018-06-15 MED ORDER — LACTATED RINGERS IV SOLN
INTRAVENOUS | Status: DC
  Administered 2018-06-15: 1000 mL via INTRAVENOUS
  Filled 2018-06-15: qty 1000

## 2018-06-15 MED ORDER — CEFAZOLIN SODIUM-DEXTROSE 2-4 GM/100ML-% IV SOLN
INTRAVENOUS | Status: AC
  Filled 2018-06-15: qty 100

## 2018-06-15 MED ORDER — MIDAZOLAM HCL 2 MG/2ML IJ SOLN
INTRAMUSCULAR | Status: AC
  Filled 2018-06-15: qty 2

## 2018-06-15 MED ORDER — SODIUM CHLORIDE 0.9 % IR SOLN
Status: DC | PRN
  Administered 2018-06-15: 1000 mL

## 2018-06-15 MED ORDER — PROPOFOL 500 MG/50ML IV EMUL
INTRAVENOUS | Status: DC | PRN
  Administered 2018-06-15: 100 ug/kg/min via INTRAVENOUS

## 2018-06-15 MED ORDER — CHLORHEXIDINE GLUCONATE 4 % EX LIQD
60.0000 mL | Freq: Once | CUTANEOUS | Status: DC
  Filled 2018-06-15: qty 118

## 2018-06-15 MED ORDER — MIDAZOLAM HCL 2 MG/2ML IJ SOLN
2.0000 mg | Freq: Once | INTRAMUSCULAR | Status: AC
  Administered 2018-06-15: 2 mg via INTRAVENOUS
  Filled 2018-06-15: qty 2

## 2018-06-15 SURGICAL SUPPLY — 66 items
BANDAGE ACE 3X5.8 VEL STRL LF (GAUZE/BANDAGES/DRESSINGS) ×3 IMPLANT
BANDAGE ACE 4X5 VEL STRL LF (GAUZE/BANDAGES/DRESSINGS) IMPLANT
BENZOIN TINCTURE PRP APPL 2/3 (GAUZE/BANDAGES/DRESSINGS) ×3 IMPLANT
BLADE AVERAGE 25MMX9MM (BLADE)
BLADE AVERAGE 25X9 (BLADE) IMPLANT
BLADE SURG 10 STRL SS (BLADE) IMPLANT
BLADE SURG 15 STRL LF DISP TIS (BLADE) ×1 IMPLANT
BLADE SURG 15 STRL SS (BLADE) ×2
BNDG ESMARK 4X9 LF (GAUZE/BANDAGES/DRESSINGS) ×3 IMPLANT
BNDG GAUZE ELAST 4 BULKY (GAUZE/BANDAGES/DRESSINGS) ×3 IMPLANT
CLOSURE WOUND 1/2 X4 (GAUZE/BANDAGES/DRESSINGS) ×1
CORD BIPOLAR FORCEPS 12FT (ELECTRODE) ×3 IMPLANT
COVER BACK TABLE 60X90IN (DRAPES) ×3 IMPLANT
COVER MAYO STAND STRL (DRAPES) ×3 IMPLANT
COVER WAND RF STERILE (DRAPES) IMPLANT
CUFF TOURNIQUET SINGLE 18IN (TOURNIQUET CUFF) IMPLANT
DRAIN PENROSE 1/4X12 LTX STRL (WOUND CARE) ×3 IMPLANT
DRAPE EXTREMITY T 121X128X90 (DRAPE) ×3 IMPLANT
DRAPE IMP U-DRAPE 54X76 (DRAPES) ×3 IMPLANT
DRAPE INCISE IOBAN 66X45 STRL (DRAPES) ×3 IMPLANT
DRAPE OEC MINIVIEW 54X84 (DRAPES) ×3 IMPLANT
DRAPE SURG 17X23 STRL (DRAPES) ×3 IMPLANT
DRSG ADAPTIC 3X8 NADH LF (GAUZE/BANDAGES/DRESSINGS) IMPLANT
DRSG EMULSION OIL 3X3 NADH (GAUZE/BANDAGES/DRESSINGS) ×3 IMPLANT
GAUZE SPONGE 4X4 12PLY STRL (GAUZE/BANDAGES/DRESSINGS) ×3 IMPLANT
GLOVE BIOGEL PI IND STRL 8.5 (GLOVE) ×1 IMPLANT
GLOVE BIOGEL PI INDICATOR 8.5 (GLOVE) ×2
GLOVE SURG ORTHO 8.0 STRL STRW (GLOVE) ×3 IMPLANT
GOWN STRL REUS W/ TWL LRG LVL3 (GOWN DISPOSABLE) ×1 IMPLANT
GOWN STRL REUS W/ TWL XL LVL3 (GOWN DISPOSABLE) ×1 IMPLANT
GOWN STRL REUS W/TWL LRG LVL3 (GOWN DISPOSABLE) ×2
GOWN STRL REUS W/TWL XL LVL3 (GOWN DISPOSABLE) ×2
LOOP VESSEL MAXI BLUE (MISCELLANEOUS) IMPLANT
LOOP VESSEL MINI RED (MISCELLANEOUS) IMPLANT
NEEDLE HYPO 25X1 1.5 SAFETY (NEEDLE) IMPLANT
NS IRRIG 1000ML POUR BTL (IV SOLUTION) ×3 IMPLANT
PACK BASIN DAY SURGERY FS (CUSTOM PROCEDURE TRAY) ×3 IMPLANT
PAD CAST 4YDX4 CTTN HI CHSV (CAST SUPPLIES) IMPLANT
PADDING CAST ABS 4INX4YD NS (CAST SUPPLIES) ×2
PADDING CAST ABS COTTON 4X4 ST (CAST SUPPLIES) ×1 IMPLANT
PADDING CAST COTTON 4X4 STRL (CAST SUPPLIES)
PASSER SUT SWANSON 36MM LOOP (INSTRUMENTS) IMPLANT
SLING ARM FOAM STRAP LRG (SOFTGOODS) ×3 IMPLANT
SPLINT FIBERGLASS 3X35 (CAST SUPPLIES) ×3 IMPLANT
SPLINT FIBERGLASS 4X30 (CAST SUPPLIES) IMPLANT
SPONGE LAP 4X18 RFD (DISPOSABLE) ×3 IMPLANT
STOCKINETTE 4X48 STRL (DRAPES) ×3 IMPLANT
STOCKING TED THIGH LEN MED SRT (STOCKING) ×2
STOCKING THIGH MED SHT (STOCKING) ×1 IMPLANT
STRIP CLOSURE SKIN 1/2X4 (GAUZE/BANDAGES/DRESSINGS) ×2 IMPLANT
SUCTION FRAZIER HANDLE 10FR (MISCELLANEOUS)
SUCTION TUBE FRAZIER 10FR DISP (MISCELLANEOUS) IMPLANT
SUT ETHIBOND 3-0 V-5 (SUTURE) IMPLANT
SUT ETHILON 4 0 PS 2 18 (SUTURE) IMPLANT
SUT MERSILENE 4 0 P 3 (SUTURE) IMPLANT
SUT PROLENE 3 0 PS 2 (SUTURE) IMPLANT
SUT PROLENE 4 0 PS 2 18 (SUTURE) ×3 IMPLANT
SUT VIC AB 0 CT1 36 (SUTURE) ×3 IMPLANT
SUT VIC AB 2-0 SH 27 (SUTURE) ×2
SUT VIC AB 2-0 SH 27XBRD (SUTURE) ×1 IMPLANT
SUT VICRYL 4-0 PS2 18IN ABS (SUTURE) ×3 IMPLANT
SYR BULB 3OZ (MISCELLANEOUS) ×3 IMPLANT
SYR CONTROL 10ML LL (SYRINGE) IMPLANT
TUBE CONNECTING 12'X1/4 (SUCTIONS)
TUBE CONNECTING 12X1/4 (SUCTIONS) IMPLANT
UNDERPAD 30X30 (UNDERPADS AND DIAPERS) ×3 IMPLANT

## 2018-06-15 NOTE — Op Note (Signed)
PREOPERATIVE DIAGNOSIS:Right proximal radial ulnar joint pain  POSTOPERATIVE DIAGNOSIS:Same  ATTENDING SURGEON:Dr. Iran Planas who was scrubbed and present for the entire procedure  ASSISTANT SURGEON:Samantha Shelby [facility] who was scrubbed and necessary for the entire procedure who helped aid in exposure resection arthroplasty splinting closure in a timely fashion  ANESTHESIA:Regional block with IV sedation  OPERATIVE PROCEDURE: 1.:  Right proximal radius arthroplasty with resection of portion of the radial neck 2.Radiographs 3 views right elbow  IMPLANTS:None  RADIOGRAPHIC INTERPRETATION:AP lateral and oblique views of the elbow do show the resection of the proximal radius with good alignment of the ulnohumeral joint  SURGICAL INDICATIONS:Patient is a right-hand dominant gentleman who had the persistent nonunion of the proximal radius.  After radial head resection patient was still having pain with grinding sensation at the proximal radioulnar joint.  Wrist benefits and alternatives discussed in detail with the patient in a signed informed consent was obtained.  Risks include but not limited to bleeding infection damage to nearby nerves arteries or tendons loss of motion of the wrist and digits incomplete relief of symptoms and need for further surgical intervention  [place] was properly identified in the preoperative holding area and marked with a permanent marker made on the right elbow indicates correct operative site.  Patient then brought back five-bedroom placed supine on the anesthesia table where the regional anesthetic had been administered.  Patient tolerated this well.  The IV sedation was administered.  A well-padded tourniquet was then placed on the right brachium and see with the appropriate drape.  The right upper extremity and prepped and draped normal sterile fashion.  A timeout was called the correct site was identified procedure than begun.  The previous skin  incision was then used.  Dissection carried down through the skin and subcutaneous tissue.  The extensor interval was split.  Exposure the joint was then carried out.  Working carefully from a  Proximal to distal direction blunt dissection was then carried out with careful dissection to expose the radial neck.  After exposure blunt retractors were then placed around the radial neck.  Complete visualization of the radial neck was then done.  With complete visualization portion of the proximal radius was then resected completing the arthroplasty.  This allowed for good visualization within the proximal radial ulnar region.  Did not see any evidence of impingement after the resection arthroplasty.  The wound was then thoroughly irrigated.  Final radiographs were than obtained.  After radiographs stability testing was then done.  The do not appear to be any instability or any ability to re-create patient's preoperative symptoms.  The extensor interval and joint was then thoroughly irrigated.  The interval was then closed with a 0 Vicryl suture.  After closure the extensor interval attention was then turned to closer of the extensor apron neurosis as well as the subcutaneous tissues.  The skin was then closed using simple Prolene suture.  Adaptic dressing and a sterile compressive bandage and applied.  The patient was then placed in a well-padded sugar tong splint expanding taken recovery room in good condition.  Postoperative plan: Patient be discharged to home.  Seen back in the office in 10-14 days for wound check x-rays begin some gentle active range of motion use and activity.    POSTOPERATIVE PLAN:

## 2018-06-15 NOTE — Discharge Instructions (Signed)
KEEP BANDAGE CLEAN AND DRY CALL OFFICE FOR F/U APPT [phone] IN 15 DAYS RX SENT [facility] DILAUDID KEEP HAND ELEVATED ABOVE HEART OK TO APPLY ICE TO OPERATIVE AREA CONTACT OFFICE IF ANY WORSENING PAIN OR CONCERNS.  TLS Drain Instructions You have a drain tube in place to help limit the amount of blood that collects under your skin in the early post-operative period.  The amount it drains will vary from person-to-person and is dependent upon many factors.  You will have 1-2 extra drain tubes sent with you from the facility.  You should change the tube according to these instructions below when the tube is about half full.  BE SURE TO SLIDE THE CLAMP TO THE CLOSED POSITION BEFORE CHANGING THE TUBE.    RE-OPEN THE CLAMP ONCE THE GLASS EVACUATION TUBE HAS BEEN CHANGED.  24 hours after surgery the amount of drainage will likely be negligible and it will be time to remove the tube and discard it.  Simply remove the tape that secures the flexible plastic drainage tube to the bandage and briskly pull the tube straight out.  You will likely feel a little discomfort during this process, but the tube should slide out as it is not sewn or otherwise secured directly to your body.  It is merely held in place by the bandage itself.                              If you are not clear about when your first post-op appointment is scheduled, please call the office on the next business day to inquire about it.  Please also call the office if you have any other questions [phone]).    Call your surgeon if you experience:   1.  Fever over 101.0. 2.  Inability to urinate. 3.  Nausea and/or vomiting. 4.  Extreme swelling or bruising at the surgical site. 5.  Continued bleeding from the incision. 6.  Increased pain, redness or drainage from the incision. 7.  Problems related to your pain medication. 8.  Any problems and/or concerns Post Anesthesia Home Care Instructions  Activity: Get plenty of  rest for the remainder of the day. A responsible individual must stay with you for 24 hours following the procedure.  For the next 24 hours, DO NOT: -Drive a car -Paediatric nurse -Drink alcoholic beverages -Take any medication unless instructed by your physician -Make any legal decisions or sign important papers.  Meals: Start with liquid foods such as gelatin or soup. Progress to regular foods as tolerated. Avoid greasy, spicy, heavy foods. If nausea and/or vomiting occur, drink only clear liquids until the nausea and/or vomiting subsides. Call your physician if vomiting continues.  Special Instructions/Symptoms: Your throat may feel dry or sore from the anesthesia or the breathing tube placed in your throat during surgery. If this causes discomfort, gargle with warm salt water. The discomfort should disappear within 24 hours.  If you had a scopolamine patch placed behind your ear for the management of post- operative nausea and/or vomiting:  1. The medication in the patch is effective for 72 hours, after which it should be removed.  Wrap patch in a tissue and discard in the trash. Wash hands thoroughly with soap and water. 2. You may remove the patch earlier than 72 hours if you experience unpleasant side effects which may include dry mouth, dizziness or visual disturbances. 3. Avoid touching the patch. Wash your  your hands with soap and water after contact with the patch.       

## 2018-06-15 NOTE — Transfer of Care (Signed)
Immediate Anesthesia Transfer of Care Note  Patient: Alex [name]  Procedure(s) Performed: Procedure(s) (LRB): RIGHT ELBOW PROXIMAL RADIOULNAR JOINT DEBRIDEMENT AND ARTHROPLASTY (Right)  Patient Location: PACU  Anesthesia Type: MAC  Level of Consciousness: awake, alert , oriented and patient cooperative  Airway & Oxygen Therapy: Patient Spontanous Breathing and Patient connected to face mask oxygen  Post-op Assessment: Report given to PACU RN and Post -op Vital signs reviewed and stable  Post vital signs: Reviewed and stable  Complications: No apparent anesthesia complications   Last Vitals:  Vitals Value Taken Time  BP    Temp    Pulse 66 06/15/2018  4:27 PM  Resp 12 06/15/2018  4:27 PM  SpO2 97 % 06/15/2018  4:27 PM  Vitals shown include unvalidated device data.  Last Pain:  Vitals:   06/15/18 1209  TempSrc:   PainSc: 0-No pain      Patients Stated Pain Goal: 6 (06/15/18 1209)

## 2018-06-15 NOTE — Anesthesia Procedure Notes (Signed)
Anesthesia Regional Block: Supraclavicular block   Pre-Anesthetic Checklist: ,, timeout performed, Correct Patient, Correct Site, Correct Laterality, Correct Procedure, Correct Position, site marked, Risks and benefits discussed,  Surgical consent,  Pre-op evaluation,  At surgeon's request and post-op pain management  Laterality: Right  Prep: chloraprep       Needles:  Injection technique: Single-shot  Needle Type: Echogenic Needle     Needle Length: 9cm  Needle Gauge: 21     Additional Needles:   Procedures:,,,, ultrasound used (permanent image in chart),,,,  Narrative:  Start time: 06/15/2018 1:02 PM End time: 06/15/2018 1:07 PM Injection made incrementally with aspirations every 5 mL.  Performed by: Personally  Anesthesiologist: Catalina Gravel, MD  Additional Notes: No pain on injection. No increased resistance to injection. Injection made in 5cc increments.  Good needle visualization.  Patient tolerated procedure well.

## 2018-06-15 NOTE — Progress Notes (Signed)
Pt tolerated supraclavicular block  well.

## 2018-06-15 NOTE — Anesthesia Preprocedure Evaluation (Signed)
Anesthesia Evaluation  Patient identified by MRN, date of birth, ID band Patient awake    Reviewed: Allergy & Precautions, H&P , NPO status , Patient's Chart, lab work & pertinent test results, Unable to perform ROS - Chart review only  History of Anesthesia Complications (+) history of anesthetic complications (Postop urinary retention)  Airway Mallampati: II  TM Distance: >3 FB Neck ROM: Full    Dental no notable dental hx. (+) Teeth Intact, Dental Advisory Given,    Pulmonary COPD,  COPD inhaler,    Pulmonary exam normal breath sounds clear to auscultation       Cardiovascular hypertension, Pt. on medications + CAD (non-obstructive) and + Peripheral Vascular Disease  Normal cardiovascular exam Rhythm:Regular Rate:Normal  ECHO 12/12 Study Conclusions  Left ventricle: The cavity size was normal. Wall thickness was normal. Systolic function was normal. The estimated ejection fraction was in the range of 55% to 60%. Wall motion was normal; there were no regional wall motion abnormalities. Doppler parameters are consistent with abnormal left ventricular relaxation (grade 1 diastolic dysfunction).   Neuro/Psych PSYCHIATRIC DISORDERS Anxiety Depression Bipolar Disorder  Neuromuscular disease    GI/Hepatic Neg liver ROS, hiatal hernia, PUD, GERD  Medicated,  Endo/Other  Hypothyroidism   Renal/GU negative Renal ROS     Musculoskeletal  (+) Arthritis ,   Abdominal   Peds  Hematology negative hematology ROS (+)   Anesthesia Other Findings Day of surgery medications reviewed with the patient.  Reproductive/Obstetrics                             Anesthesia Physical  Anesthesia Plan  ASA: III  Anesthesia Plan: MAC and Regional   Post-op Pain Management:    Induction: Intravenous  PONV Risk Score and Plan: 1 and Ondansetron, Treatment may vary due to age or medical condition and Propofol  infusion  Airway Management Planned: Natural Airway, Nasal Cannula and Simple Face Mask  Additional Equipment:   Intra-op Plan:   Post-operative Plan:   Informed Consent: I have reviewed the patients History and Physical, chart, labs and discussed the procedure including the risks, benefits and alternatives for the proposed anesthesia with the patient or authorized representative who has indicated his/her understanding and acceptance.   Dental advisory given  Plan Discussed with: CRNA  Anesthesia Plan Comments:         Anesthesia Quick Evaluation

## 2018-06-15 NOTE — Progress Notes (Signed)
Time out, correct patient, site, side, surgery,  Performed and verified  with Dr. Hoy Morn.

## 2018-06-16 ENCOUNTER — Encounter (HOSPITAL_BASED_OUTPATIENT_CLINIC_OR_DEPARTMENT_OTHER): Payer: Self-pay | Admitting: Orthopedic Surgery

## 2018-06-16 ENCOUNTER — Other Ambulatory Visit: Payer: Self-pay | Admitting: *Deleted

## 2018-06-16 DIAGNOSIS — G4489 Other headache syndrome: Secondary | ICD-10-CM

## 2018-06-16 DIAGNOSIS — R519 Headache, unspecified: Secondary | ICD-10-CM

## 2018-06-16 DIAGNOSIS — R51 Headache: Principal | ICD-10-CM

## 2018-06-16 DIAGNOSIS — H908 Mixed conductive and sensorineural hearing loss, unspecified: Secondary | ICD-10-CM

## 2018-06-16 NOTE — Anesthesia Postprocedure Evaluation (Signed)
Anesthesia Post Note  Patient: Alex [name]  Procedure(s) Performed: RIGHT ELBOW PROXIMAL RADIOULNAR JOINT DEBRIDEMENT AND ARTHROPLASTY (Right Elbow)     Patient location during evaluation: PACU Anesthesia Type: Regional Level of consciousness: awake and alert Pain management: pain level controlled Vital Signs Assessment: post-procedure vital signs reviewed and stable Respiratory status: spontaneous breathing, nonlabored ventilation, respiratory function stable and patient connected to nasal cannula oxygen Cardiovascular status: blood pressure returned to baseline and stable Postop Assessment: no apparent nausea or vomiting Anesthetic complications: no    Last Vitals:  Vitals:   06/15/18 1700 06/15/18 1730  BP: 112/80 (!) 121/93  Pulse: 66 63  Resp: 18 16  Temp:  (!) 36.2 C  SpO2: 100% 98%    Last Pain:  Vitals:   06/15/18 1730  TempSrc:   PainSc: 0-No pain   Pain Goal: Patients Stated Pain Goal: 6 (06/15/18 1209)               Lidia Collum

## 2018-06-17 ENCOUNTER — Other Ambulatory Visit: Payer: Self-pay

## 2018-06-29 ENCOUNTER — Ambulatory Visit (INDEPENDENT_AMBULATORY_CARE_PROVIDER_SITE_OTHER): Payer: Managed Care, Other (non HMO) | Admitting: Family

## 2018-06-29 ENCOUNTER — Encounter: Payer: Self-pay | Admitting: Family

## 2018-06-29 ENCOUNTER — Encounter (HOSPITAL_COMMUNITY): Payer: Self-pay | Admitting: Licensed Clinical Social Worker

## 2018-06-29 ENCOUNTER — Ambulatory Visit (INDEPENDENT_AMBULATORY_CARE_PROVIDER_SITE_OTHER): Payer: 59 | Admitting: Licensed Clinical Social Worker

## 2018-06-29 VITALS — BP 136/82 | HR 80 | Temp 97.8°F | Ht 72.0 in | Wt 181.2 lb

## 2018-06-29 DIAGNOSIS — J019 Acute sinusitis, unspecified: Secondary | ICD-10-CM | POA: Diagnosis not present

## 2018-06-29 DIAGNOSIS — F3162 Bipolar disorder, current episode mixed, moderate: Secondary | ICD-10-CM | POA: Diagnosis not present

## 2018-06-29 MED ORDER — AMOXICILLIN-POT CLAVULANATE 875-125 MG PO TABS: 1.0000 | ORAL_TABLET | Freq: Two times a day (BID) | ORAL | 0 refills | Status: DC

## 2018-06-29 MED ORDER — FLUTICASONE PROPIONATE 50 MCG/ACT NA SUSP: 2.0000 | Freq: Every day | NASAL | 6 refills | Status: DC

## 2018-06-29 NOTE — Progress Notes (Signed)
Alex [name] is a 62 y.o. male with the following history as recorded in EpicCare:  Patient Active Problem List   Diagnosis Date Noted  . Bruising 11/17/2017  . Testicular pain, right 08/01/2017  . Fatigue 01/21/2017  . Ankle pain, left 01/21/2017  . Fall (on) (from) other stairs and steps, initial encounter 11/11/2016  . Abrasion of head 11/11/2016  . Thoracic aortic aneurysm ([place]) 07/23/2016  . Erectile dysfunction 07/19/2016  . Bronchiectasis ([place]) 04/23/2016  . Chronic bronchitis ([place]) 03/11/2016  . Acute URI 11/03/2015  . Cerumen impaction 08/04/2015  . Cervical disc disorder with radiculopathy of cervical region 06/06/2014  . Right elbow pain 06/06/2014  . Elevated WBC count 06/06/2014  . Iron deficiency anemia 06/06/2014  . Pre-operative exam 04/07/2014  . Chronic right SI joint pain 02/21/2014  . Edema 02/21/2014  . Tinnitus 02/21/2014  . Well adult exam 05/31/2013  . Glaucoma 05/31/2013  . RML pneumonia ([place]) 03/31/2013  . Hypertension, uncontrolled 02/25/2013  . Ingrowing toenail with infection 04/02/2012  . Localized osteoarthritis of left knee 04/02/2012  . Vertigo 01/05/2012  . Ataxia 01/05/2012  . Anxiety state 08/08/2010  . ESOPHAGEAL STRICTURE 11/30/2009  . CHANGE IN BOWELS 11/30/2009  . Major depression, chronic 08/07/2009  . Osteoarthritis 08/07/2009  . GENERALIZED OSTEOARTHROSIS UNSPECIFIED SITE 10/20/2008  . Dyslipidemia 10/19/2008  . FOOT PAIN 07/11/2008  . Orchitis and epididymitis 05/24/2008  . RASH-NONVESICULAR 05/14/2008  . Cough 03/29/2008  . Coronary atherosclerosis 11/04/2007  . Lumbago 11/04/2007  . THYROIDECTOMY, HX OF 11/04/2007  . 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 NEOPLASM, MALIGNANT, THYROID GLAND 10/02/2007  . 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 IMPAIRED GLUCOSE TOLERANCE 10/02/2007    Current Outpatient Medications  Medication Sig Dispense Refill  .  acetaminophen (TYLENOL) 500 MG tablet Take 1,000 mg by mouth 2 (two) times daily as needed for headache.    [name] amLODipine (NORVASC) 10 MG tablet Take 1 tablet (10 mg total) by mouth daily. (Patient taking differently: Take 10 mg by mouth every evening. ) 90 tablet 4  . aspirin EC 81 MG tablet Take 1 tablet by mouth daily.    [name] atorvastatin (LIPITOR) 80 MG tablet Take 1 tablet (80 mg total) by mouth at bedtime. (Patient taking differently: Take 40 mg by mouth at bedtime. ) 90 tablet 3  . AXIRON 30 MG/ACT SOLN Place 2 Act onto the skin daily.    . budesonide-formoterol (SYMBICORT) 80-4.5 MCG/ACT inhaler Inhale 2 puffs into the lungs 2 (two) times daily. (Patient taking differently: Inhale 2 puffs into the lungs 2 (two) times daily as needed (Bronchitis). ) 1 Inhaler 0  . Coenzyme Q10 (CO Q 10 PO) Take 1 capsule by mouth daily.     . Immune Globulin, Human, (HIZENTRA) 4 GM/20ML SOLN Inject into the skin once a week. wednesday    . LamoTRIgine 300 MG TB24 24 hour tablet Take 1 tablet (300 mg total) by mouth daily. (Patient taking differently: Take 1 tablet by mouth every morning. ) 90 tablet 0  . latanoprost (XALATAN) 0.005 % ophthalmic solution 1 DROP EACH EYE AT NIGHT    . levalbuterol (XOPENEX HFA) 45 MCG/ACT inhaler Inhale 2 puffs into the lungs every 6 (six) hours as needed for wheezing. 1 Inhaler 5  . levothyroxine (SYNTHROID, LEVOTHROID) 150 MCG tablet TAKE 1 TABLET BY MOUTH EVERY DAY (Patient taking differently: TAKE 1 TABLET BY MOUTH EVERY DAY AT BEDTIME) 90 tablet 3  . lidocaine-prilocaine (EMLA)  cream Apply 1 application topically as needed.     [name] LORazepam (ATIVAN) 0.5 MG tablet Take 1 tab daily as needed and 1 tab at bed time 60 tablet 2  . LUTEIN PO Take 1 tablet by mouth daily.     [name] LYCOPENE PO Take by mouth.    . meloxicam (MOBIC) 15 MG tablet Take 1 tablet (15 mg total) by mouth daily as needed. 30 tablet 2  . Multiple Vitamin (MULTI-VITAMINS) TABS Take by mouth.    . OMEGA-3 KRILL OIL  PO Take 1 capsule by mouth daily.     [name] omeprazole (PRILOSEC) 20 MG capsule Take 20 mg by mouth every evening.    . pseudoephedrine (SUDAFED) 30 MG tablet Take 60 mg by mouth daily as needed for congestion.    [name] Respiratory Therapy Supplies (FLUTTER) DEVI Use as directed 1 each 0  . Saw Palmetto, Serenoa repens, (SAW PALMETTO PO) Take by mouth.    . sildenafil (VIAGRA) 25 MG tablet Take 5 mg by mouth daily as needed (BPH).     [name] Spacer/Aero-Holding Chambers (AEROCHAMBER MV) inhaler Use as instructed 1 each 0  . sucralfate (CARAFATE) 1 G tablet Take 1 g by mouth daily as needed (ACID REFLUX).   3  . tadalafil (ADCIRCA/CIALIS) 20 MG tablet Take 20 mg by mouth daily as needed for erectile dysfunction.    [name] telmisartan (MICARDIS) 80 MG tablet TAKE 1 TABLET (80 MG TOTAL) BY MOUTH DAILY. (Patient taking differently: Take 80 mg by mouth every morning. ) 90 tablet 3  . zolpidem (AMBIEN) 10 MG tablet Take 1 tablet (10 mg total) by mouth at bedtime as needed. for sleep 30 tablet 1   No current facility-administered medications for this visit.     Allergies: Percocet [oxycodone-acetaminophen] and Vicodin [hydrocodone-acetaminophen]  Past Medical History:  Diagnosis Date  . Anemia   . Anxiety   . Bipolar I disorder ([place])   . Bleeding ulcer 2016  . BPH (benign prostatic hypertrophy) with urinary obstruction   . 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 Cataract    LEFT EYE  . Chronic back pain   . Complication of anesthesia POST URINARY RETENTION---  2006 SHOULDER SURGERY MARKED BRADYCARDIA VAGAL RESPONSE NO ISSUE W/ SURGERY AFTER THIS ONE   WITH GENERAL ANESTHESIA, 15 YRS AGO VASOVAGAL REACTION NONE SINCE  . 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 Coronary atherosclerosis CARDIOLOGIST- DR CRENSHAW--  LAST VISIT 01-05-2012 IN EPIC   NON-OBSTRUCTIVE MILD DISEASE  . 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[place])   . Depression   . Epicondylitis    right  elbow  . GERD (gastroesophageal reflux disease)   . Glaucoma BOTH EYES   RIGHT EYE RADIATION DAMAGE  . Hearing loss    BOTH EARS  . Hearing loss    Bilateral  . Hepatic cyst    Several, The lesion of concern in segment 6 of the liver has single large portal vein and hepatic vein branches extending to tt, in a pattern of enhancement which mirrors these vascular structures. 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 These can be seen in normal patients and also on patient's with portal venous hypertension and in this case the lesion   . History of chronic prostatitis   . History of deviated nasal septum   . History of hiatal hernia    SMALL  . History of kidney stones   .  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 History of thyroid cancer PRIMARY (NO METS FROM ORBITAL CANCER)--   IN REMISSION   S/P TOTAL THYROIDECTOMY  , CHEMORADIATION  (ONCOLOGIST -- DR Griffith Citron)  . Hyperlipidemia   . Hypertension   . Macular degeneration    Left  . Nocturia   . OA (osteoarthritis)   . Pancreas cyst   . Peripheral vascular disease ([facility])    THORACIC AA 3. 9 CM X 4. 3 CM PER NOV 06-14-17  CHEST CTFOLOWED BY DR CRENSHAW YEARLY FOR  . Positional vertigo    HX OF WITH SINUS INFECTIONS  . Radial head fracture    Right  . Thoracic aortic aneurysm ([place]) 06/14/2017   last CT 4.1 CM Mild  . Tinnitus    CONSTANT  . Ulnar nerve compression    right elbow  . Unsteady gait    especially with stairs, depth perception off  . Urinary hesitancy   . Wears glasses     Past Surgical History:  Procedure Laterality Date  . CARDIAC CATHETERIZATION  01-16-2006  DR Marcello Moores WALL   MILD CORONARY ATHEROSCLEROSIS/ MID TO DISTAL LAD 40% STENOSIS/ LVF 50-55%  . CARPAL TUNNEL RELEASE Right 11/03/2017   Procedure: RIGHT  HAND CARPAL TUNNEL RELEASE;  Surgeon: Iran Planas, MD;  Location: [facility];  Service: Orthopedics;  Laterality: Right;  . CATARACT EXTRACTION Right   . COLONSCOPY  2017 LAST DONE   MULTIPLE  . ENDOSCOPY  LAST 2017   MULTIPLE DONE DILATION DONE ALSO  . ESOPHAGOGASTRODUODENOSCOPY (EGD) WITH PROPOFOL N/A 02/24/2018   Procedure: ESOPHAGOGASTRODUODENOSCOPY (EGD) WITH PROPOFOL;  Surgeon: Laurence Spates, MD;  Location: [facility];  Service: Endoscopy;  Laterality: N/A;  . EXCISION RADIAL HEAD Right 11/03/2017   Procedure: RIGHT PROXIMAL RADIUS RADIAL HEAD RESECTION AND JOINT DEBRIDEMENT;  Surgeon: Iran Planas, MD;  Location: [place];  Service: Orthopedics;  Laterality: Right;  . KNEE ARTHROSCOPY  05/01/2012   Procedure: ARTHROSCOPY KNEE;  Surgeon: Johnn Hai, MD;  [facility];  Service: Orthopedics;  Laterality: Left;  debridement and removal of loose body  . LEFT ANKLE ARTHROSCOPY W/ DEBRIDEMENT  05-12-2007  . LEFT HYDROCELECTOMY  03-29-2005   AND REPAIR LEFT INGUINAL HERNIA W/ MESH  . MOHS SURGERY  2002   RIGHT ORBITAL CANCER  . NASAL ENDOSCOPY  08-07-2005   RIGHT EPISTAXIS  / POST SEPTOPLASTY  (HX RIGHT ORBITAL CA & S/P RADIATION/ NECROSIS ANTERIOR END OF BOTH INFERIOR TURBINATES)  . occuloplastic surgery  2002  . PARS PLANA VITRECTOMY  11-06-2004   RIGHT EYE RADIATION RETINOPATHY W/ HEMORRHAGE  . RADIAL HEAD ARTHROPLASTY Right 06/15/2018   Procedure: RIGHT ELBOW PROXIMAL RADIOULNAR JOINT DEBRIDEMENT AND ARTHROPLASTY;  Surgeon: Iran Planas, MD;  Location: [place];  Service: Orthopedics;  Laterality: Right;  BLOCK WITH SEDATION  . REPAIR UNDESENDED RIGHT TESTICLE / RIGHT INGUINAL HERNIA  AGE 49  . RIGHT ANKLE ARTHROSCOPY W/ EXTENSIVE DEBRIDEMENT  04/05/2008   x2  . RIGHT SHOULDER SURGERY  2006  . RIGHT SUPRAOMOHYOID NECK DISSECTION   03-08-2003   ZONES 1,2,3;   SUBMANDIBULAR MASS / METASTATIC SQUAMOUS  CELL CARCINOMA RIGHT NECK  . SAVORY DILATION N/A 02/24/2018   Procedure: SAVORY DILATION;  Surgeon: Oletta Lamas,  Jeneen Rinks, MD;  Location: [facility];  Service: Endoscopy;  Laterality: N/A;  . SEPTOPLASTY  NOV 2006  . SHOULDER ARTHROSCOPY Left   . SHOULDER ARTHROSCOPY W/ SUBACROMIAL DECOMPRESSION AND DISTAL CLAVICLE EXCISION  10-09-2008   AND DEBRIDEMENT OF RIGHT SHOULDER IMPINGEMENT & [facility] JOINT ARTHRITIS  . SPINE SURGERY  2016   l 3 TO l 4 PLATE AND SCREWS  . TOTAL THYROIDECTOMY  11-03-2001   PAPILLARY THYROID CARCINOMA  . TRANSTHORACIC ECHOCARDIOGRAM  12/ 2012   grade I diastolic dysfunction/ ef 59-29%  . ULNAR NERVE TRANSPOSITION Right 04/28/2014   Procedure: RIGHT ELBOW ULNA NERVE RELEASE TRANSPOSTION AND MEDIAL EPICONDYLAR DEBRIDEMENT AND REPAIR;  Surgeon: Linna Hoff, MD;  Location: [place];  Service: Orthopedics;  Laterality: Right;    Family History  Problem Relation Age of Onset  . Depression Sister   . Rectal cancer Sister   . Lung cancer Brother   . Kidney disease Mother   . Depression Mother   . Stroke Father   . COPD Father   . Hypertension Father   . Prostate cancer Father   . Depression Daughter   . Drug abuse Daughter   . Psychiatric Illness Son     Social History   Tobacco Use  . Smoking status: Never Smoker  . Smokeless tobacco: Never Used  Substance Use Topics  . Alcohol use: Yes    Alcohol/week: 0.0 standard drinks    Comment: 1-2 glasses per month    Subjective:  Patient presents with cough/ congestion x 1 week; + nasal drainage; using OTC Sudafed; + ear fullness; no fever; no chest pain, no shortness of breath; did have extensive dental work done last year;    Objective:  Vitals:   06/29/18 1436  BP: 136/82  Pulse: 80  Temp: 97.8 F (36.6 C)  TempSrc: Oral  SpO2: 98%  Weight: 181 lb 3.2 oz (82.2 kg)  Height: 6' (1.829 m)    General: Well developed, well nourished, in no acute distress  Skin : Warm and dry.  Head:  Normocephalic and atraumatic  Eyes: Sclera and conjunctiva clear; pupils round and reactive to light; extraocular movements intact  Ears: External normal; canals clear; tympanic membranes congested bilaterally  Oropharynx: Pink, supple. No suspicious lesions  Neck: Supple without thyromegaly, adenopathy  Lungs: Respirations unlabored; clear to auscultation bilaterally without wheeze, rales, rhonchi  CVS exam: normal rate and regular rhythm.  Neurologic: Alert and oriented; speech intact; face symmetrical; moves all extremities well; CNII-XII intact without focal deficit   Assessment:  1. Acute sinusitis, recurrence not specified, unspecified location     Plan:  Rx for Augmentin 875 mg bid x 10 days, Flonase NS; increase fluids, rest and follow-up worse, no better.   No follow-ups on file.  No orders of the defined types were placed in this encounter.   Requested Prescriptions    No prescriptions requested or ordered in this encounter

## 2018-06-29 NOTE — Progress Notes (Signed)
Therapist Progress Note   Time: 4:10-5pm  Participation Level: Active  Behavioral Response: Casual/Alert/ Anxious  Type of Therapy: Individual Therapy  Treatment Goals Addressed: Coping  Interventions: Supportive: CBT  Summary: Pt presented for individual therapy. Pt discussed his psychiatric symptoms and current life events. Pt discussed his psychiatric symptoms and reports his moods are somewhat stable. He continues to have the same stressors: children, health, $. Pt had another elbow surgery 2 weeks ago. He has his return appt tomorrow. Pt has a liver & pancreas biopsy this week at [facility]. He has a MRI on his brain and neck scheduled for December, followup from cancer previously. Pt reports his children are still living in the home for free, he pays their cell bills and care insurance. Asked open ended questions about when this was going to change. If he is going to do nothing about it he cannot come into therapy and whine and complain. Pt verbally acknowledged his understanding of the suggestion. Pt reports he is concerned about all his continued health issues with little comfort and support from wife. Asked open ended questions and used empathic reflection.    Suicidal/Homicidal: No/ without plan  Therapist Response: Assessed pt's current functioning and reviewed progress. Assisted pt processing family issues, health issues, children that live in the home. Assisted pt  processing for management of stressors.  Plan: Return in 4 weeks using CBT  Diagnosis: Axis 1: Bipolar 1 disorder,   [name], LCAS 06/29/18

## 2018-06-30 ENCOUNTER — Encounter: Payer: Self-pay | Admitting: Cardiology

## 2018-06-30 ENCOUNTER — Telehealth: Payer: Self-pay | Admitting: *Deleted

## 2018-06-30 NOTE — Telephone Encounter (Addendum)
Spoke with patient and reviewed Dr Jacalyn Lefevre recommendations, verbalized understanding.   Alex [name] F at 06/30/2018 4:35 PM   Status: Signed    New Message    Patient returned your call would like a call back.        Left message to call back

## 2018-06-30 NOTE — Telephone Encounter (Signed)
New  Message  ° ° ° ° ° ° ° ° °Patient returned your call would like a call back ° ° ° ° ° ° ° °

## 2018-06-30 NOTE — Telephone Encounter (Signed)
This encounter was created in error - please disregard.

## 2018-06-30 NOTE — Telephone Encounter (Addendum)
Patient walked in today to discuss Atorvastatin. It was recently increased from 40 mg to 80 mg daily. After increasing Atorvastatin he started having muscle aches and more fatigue. He has been back on the 40 mg daily for about 2 weeks and is doing better with this dose. Patient does not want to take Crestor secondary to reading about side effects. He is willing to see a nutritionist if this will be helpful. Will forward to Dr Stanford Breed for review

## 2018-06-30 NOTE — Telephone Encounter (Signed)
Continue Lipitor 40 mg daily. [name], MD

## 2018-07-13 ENCOUNTER — Ambulatory Visit (INDEPENDENT_AMBULATORY_CARE_PROVIDER_SITE_OTHER): Payer: 59 | Admitting: Licensed Clinical Social Worker

## 2018-07-13 ENCOUNTER — Encounter (HOSPITAL_COMMUNITY): Payer: Self-pay | Admitting: Licensed Clinical Social Worker

## 2018-07-13 DIAGNOSIS — F3162 Bipolar disorder, current episode mixed, moderate: Secondary | ICD-10-CM | POA: Diagnosis not present

## 2018-07-13 NOTE — Progress Notes (Signed)
Therapist Progress Note   Time: 4:10-5pm  Participation Level: Active  Behavioral Response: Casual/Alert/ Anxious  Type of Therapy: Individual Therapy  Treatment Goals Addressed: Improve Psychiatric Symptoms, elevate mood (increased self-esteem, increased self-compassion, increased interaction), improve unhelpful thought patterns, controlled behavior, moderate mood, deliberate speech and thought process(improved social functioning, healthy adjustment to living situation), Learn about diagnosis, healthy coping skills  Interventions: Supportive: CBT  Summary: Pt presented for individual therapy. Pt discussed his psychiatric symptoms and current life events. Pt discussed his psychiatric symptoms and reports his moods are somewhat stable. He continues to have the same stressors: children, health, $.  Pt had pancreas biopsy at [facility], no results yet. In December MRI/CT on brain and neck. Pt continues to have limited support from his wife on medical issues and family issues.  Asked pt about his support system, he reports he has no support system. Asked open ended questions about his Bible study. Ask: I need a support system, can you be that for me? Processed with pt vulnerability.   Suicidal/Homicidal: No/ without plan  Therapist Response: Assessed pt's current functioning and reviewed progress. Assisted pt processing family issues, health issues, support system, vulnerability. Assisted pt  processing for management of stressors.  Plan: Return in 2 weeks using CBT  Diagnosis: Axis 1: Bipolar 1 disorder,   [name], LCAS 07/13/18

## 2018-07-20 ENCOUNTER — Encounter: Payer: Self-pay | Admitting: Oncology

## 2018-07-20 ENCOUNTER — Telehealth: Payer: Self-pay | Admitting: Oncology

## 2018-07-20 ENCOUNTER — Encounter (HOSPITAL_COMMUNITY): Payer: Self-pay

## 2018-07-20 ENCOUNTER — Ambulatory Visit (HOSPITAL_COMMUNITY)
Admission: RE | Admit: 2018-07-20 | Discharge: 2018-07-20 | Disposition: A | Discharge status: Home / Self Care | Payer: Managed Care, Other (non HMO) | Source: Ambulatory Visit | Attending: Oncology | Admitting: Oncology

## 2018-07-20 DIAGNOSIS — G4489 Other headache syndrome: Secondary | ICD-10-CM | POA: Insufficient documentation

## 2018-07-20 DIAGNOSIS — E890 Postprocedural hypothyroidism: Secondary | ICD-10-CM | POA: Diagnosis not present

## 2018-07-20 DIAGNOSIS — R519 Headache, unspecified: Secondary | ICD-10-CM

## 2018-07-20 DIAGNOSIS — H908 Mixed conductive and sensorineural hearing loss, unspecified: Secondary | ICD-10-CM | POA: Diagnosis not present

## 2018-07-20 DIAGNOSIS — R51 Headache: Principal | ICD-10-CM

## 2018-07-20 MED ORDER — GADOBUTROL 1 MMOL/ML IV SOLN
8.0000 mL | Freq: Once | INTRAVENOUS | Status: AC | PRN
  Administered 2018-07-20: 8 mL via INTRAVENOUS

## 2018-07-20 MED ORDER — IOHEXOL 300 MG/ML  SOLN
75.0000 mL | Freq: Once | INTRAMUSCULAR | Status: AC | PRN
  Administered 2018-07-20: 75 mL via INTRAVENOUS

## 2018-07-20 NOTE — Telephone Encounter (Signed)
Scheduled appt per 12/6 sch message- unable to reach patient  - left message with appt date and time

## 2018-07-27 ENCOUNTER — Encounter (HOSPITAL_COMMUNITY): Payer: Self-pay | Admitting: Licensed Clinical Social Worker

## 2018-07-27 ENCOUNTER — Ambulatory Visit (INDEPENDENT_AMBULATORY_CARE_PROVIDER_SITE_OTHER): Payer: 59 | Admitting: Licensed Clinical Social Worker

## 2018-07-27 DIAGNOSIS — F3162 Bipolar disorder, current episode mixed, moderate: Secondary | ICD-10-CM

## 2018-07-27 NOTE — Progress Notes (Signed)
Therapist Progress Note   Time: 4:10-5pm  Participation Level: Active  Behavioral Response: Casual/Alert/ Anxious/ Depressed  Type of Therapy: Individual Therapy  Treatment Goals Addressed: Improve Psychiatric Symptoms, elevate mood (increased self-esteem, increased self-compassion, increased interaction), improve unhelpful thought patterns, controlled behavior, moderate mood, deliberate speech and thought process(improved social functioning, healthy adjustment to living situation), Learn about diagnosis, healthy coping skills  Interventions: Supportive: CBT  Summary: Pt presented for individual therapy. Pt discussed his psychiatric symptoms and current life events. Pt discussed his psychiatric symptoms. He presented anxious and some depressive symptoms.  He continues to have the same stressors: children, health, $.  Pt had pancreas biopsy at [facility], and he received the results: be rescanned in a year. He does not feel secure about the results and would like to talk to the dr. Suggested to pt follow up. In December MRI/CT on brain and neck. Pt was distressed today: "My future looks bleak." Asked open ended questions. Discussed options and change for his future. "What are you willing to do different to change your outcome?" He continues to struggle with his adult children that still live at home free. "What are you willing to do different?" Pt reports his temper has flared up 2x in the past week or so. Discussed reasons for angry outbursts and more positive ways to channel his anger.   Suicidal/Homicidal: No/ without plan  Therapist Response: Assessed pt's current functioning and reviewed progress. Assisted pt processing family issues, health issues, anger, future. Assisted pt  processing for management of stressors.  Plan: Return in 2 weeks using CBT  Diagnosis: Axis 1: Bipolar 1 disorder,   [name], LCAS 07/28/18

## 2018-07-31 DIAGNOSIS — H903 Sensorineural hearing loss, bilateral: Secondary | ICD-10-CM | POA: Insufficient documentation

## 2018-08-02 ENCOUNTER — Other Ambulatory Visit (HOSPITAL_COMMUNITY): Payer: Self-pay | Admitting: Psychiatry

## 2018-08-02 DIAGNOSIS — F3162 Bipolar disorder, current episode mixed, moderate: Secondary | ICD-10-CM

## 2018-08-03 ENCOUNTER — Other Ambulatory Visit: Payer: Self-pay | Admitting: Internal Medicine

## 2018-08-03 NOTE — Progress Notes (Signed)
[facility]  [phone] Fax:([phone]     ID: Alex [name] DOB: May 13, 1956  MR#: [id number]  GEX#:[id number]  Patient Care Team: Tresa Garter, MD as PCP - General Jens Som, Madolyn Frieze, MD as PCP - Cardiology (Cardiology) Colleen Kotlarz, Valentino Hue, MD (Hematology and Oncology) Marcine Matar, MD as Attending Physician (Urology) Osborn Coho, MD (Otolaryngology) Jens Som Madolyn Frieze, MD (Cardiology) Carman Ching, MD as Consulting Physician (Gastroenterology) Edwyna Ready (Allergy and Immunology) Roslynn Amble, MD as Consulting Physician (Pulmonary Disease) OTHER MD:  CHIEF COMPLAINT: Periorbital squamous cell carcinoma; thyroid cancer; iron deficiency anemia  CURRENT TREATMENT: Observation   INTERVAL HISTORY: Alex [name] returns today for follow-up for his periorbital squamous cell carcinoma, thyroid cancer, and iron deficiency anemia.  The patient continues under observation.   Since his last visit here on 06/08/2018, he underwent a chest MRA with contrast on 06/12/2018 showing stable aneurysmal dilatation of the ascending aorta at 4.1 cm. Recommend annual imaging followup by CTA or MRA. This recommendation follows 2010 ACCF/AHA/AATS/ACR/ASA/SCA/SCAI/SIR/STS/SVM Guidelines for the Diagnosis and Management of Patients with Thoracic Aortic Disease. Circulation. 2010; 121: W102-V253.   He also underwent a head MRI with and without contrast on 07/20/2018 showing No acute abnormality. No recurrent mass lesion in the orbit. Mild chronic microvascular ischemic changes have progressed since 2013.  Finally, he also underwent a neck CT with contrast on 07/20/2018 showing Previous thyroidectomy. No evidence residual or recurrent thyroid disease. Absent or atrophic right submandibular gland.  He recently had a "hearing test" (not through his regular ENT physician) that shows that he has some hearing loss and "delayed auditory processing".  I am not  sure what the validity of this is.  Certainly at today during the visit there was no obvious hearing impairment or difficulty in understanding.[name]   He is followed through Dr. Sharlene Motts at [facility] and he had an MRI of the liver which showed a small portacaval shunt in the liver, slightly increased from 1.4 to 1.7 cm.  This is being followed with yearly MRIs at [facility] (Dr. Randa Evens suggested that perhaps every 6 months would be more appropriate and that is being negotiated).  The MRI also showed a 0.6 cm cyst in the pancreas which will also be followed   REVIEW OF SYSTEMS: Alex [name] is doing well overall. The patient denies unusual headaches, visual changes, nausea, vomiting, or dizziness. There has been no unusual cough, phlegm production, or pleurisy. This been no change in bowel or bladder habits. The patient denies unexplained fatigue or unexplained weight loss, bleeding, rash, or fever. A detailed review of systems was otherwise noncontributory.    PAST MEDICAL HISTORY: Past Medical History:  Diagnosis Date  . Anemia   . Anxiety   . Bipolar I disorder (HCC)   . Bleeding ulcer 2016  . BPH (benign prostatic hypertrophy) with urinary obstruction   . Cancer [facility])    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  . Cataract    LEFT EYE  . Chronic back pain   . Complication of anesthesia POST URINARY RETENTION---  2006 SHOULDER SURGERY MARKED BRADYCARDIA VAGAL RESPONSE NO ISSUE W/ SURGERY AFTER THIS ONE   WITH GENERAL ANESTHESIA, 15 YRS AGO VASOVAGAL REACTION NONE SINCE  . 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 Coronary atherosclerosis CARDIOLOGIST- DR CRENSHAW--  LAST VISIT 01-05-2012 IN EPIC   NON-OBSTRUCTIVE MILD DISEASE  . 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HCC)   . Depression   . Epicondylitis    right  elbow  . GERD (gastroesophageal reflux disease)   . Glaucoma BOTH EYES   RIGHT EYE RADIATION DAMAGE  . Hearing loss    BOTH EARS  . Hearing loss    Bilateral  . Hepatic  cyst    Several, The lesion of concern in segment 6 of the liver has single large portal vein and hepatic vein branches extending to tt, in a pattern of enhancement which mirrors these vascular structures. 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 These can be seen in normal patients and also on patient's with portal venous hypertension and in this case the lesion   . History of chronic prostatitis   . History of deviated nasal septum   . History of hiatal hernia    SMALL  . History of kidney stones   . 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 History of thyroid cancer PRIMARY (NO METS FROM ORBITAL CANCER)--   IN REMISSION   S/P TOTAL THYROIDECTOMY  , CHEMORADIATION  (ONCOLOGIST -- DR Arlice Colt)  . Hyperlipidemia   . Hypertension   . Macular degeneration    Left  . Nocturia   . OA (osteoarthritis)   . Pancreas cyst   . Peripheral vascular disease (HCC)    THORACIC AA 3. 9 CM X 4. 3 CM PER NOV 06-14-17  CHEST CTFOLOWED BY DR CRENSHAW YEARLY FOR  . Positional vertigo    HX OF WITH SINUS INFECTIONS  . Radial head fracture    Right  . Thoracic aortic aneurysm (HCC) 06/14/2017   last CT 4.1 CM Mild  . Tinnitus    CONSTANT  . Ulnar nerve compression    right elbow  . Unsteady gait    especially with stairs, depth perception off  . Urinary hesitancy   . Wears glasses     PAST SURGICAL HISTORY: Past Surgical History:  Procedure Laterality Date  . CARDIAC CATHETERIZATION  01-16-2006  DR Maisie Fus WALL   MILD CORONARY ATHEROSCLEROSIS/ MID TO DISTAL LAD 40% STENOSIS/ LVF 50-55%  . CARPAL TUNNEL RELEASE Right 11/03/2017   Procedure: RIGHT HAND CARPAL TUNNEL RELEASE;  Surgeon: Bradly Bienenstock, MD;  Location: [facility];  Service: Orthopedics;  Laterality: Right;  . CATARACT  EXTRACTION Right   . COLONSCOPY  2017 LAST DONE   MULTIPLE  . ENDOSCOPY  LAST 2017   MULTIPLE DONE DILATION DONE ALSO  . ESOPHAGOGASTRODUODENOSCOPY (EGD) WITH PROPOFOL N/A 02/24/2018   Procedure: ESOPHAGOGASTRODUODENOSCOPY (EGD) WITH PROPOFOL;  Surgeon: Carman Ching, MD;  Location: [facility];  Service: Endoscopy;  Laterality: N/A;  . EXCISION RADIAL HEAD Right 11/03/2017   Procedure: RIGHT PROXIMAL RADIUS RADIAL HEAD RESECTION AND JOINT DEBRIDEMENT;  Surgeon: Bradly Bienenstock, MD;  Location: [facility];  Service: Orthopedics;  Laterality: Right;  . KNEE ARTHROSCOPY  05/01/2012   Procedure: ARTHROSCOPY KNEE;  Surgeon: Javier Docker, MD;  Location: [facility];  Service: Orthopedics;  Laterality: Left;  debridement and removal of loose body  . LEFT ANKLE ARTHROSCOPY W/ DEBRIDEMENT  05-12-2007  . LEFT HYDROCELECTOMY  03-29-2005   AND REPAIR LEFT INGUINAL HERNIA W/ MESH  . MOHS SURGERY  2002   RIGHT ORBITAL CANCER  . NASAL ENDOSCOPY  08-07-2005   RIGHT EPISTAXIS  / POST  SEPTOPLASTY  (HX RIGHT ORBITAL CA & S/P RADIATION/ NECROSIS ANTERIOR END OF BOTH INFERIOR TURBINATES)  . occuloplastic surgery  2002  . PARS PLANA VITRECTOMY  11-06-2004   RIGHT EYE RADIATION RETINOPATHY W/ HEMORRHAGE  . RADIAL HEAD ARTHROPLASTY Right 06/15/2018   Procedure: RIGHT ELBOW PROXIMAL RADIOULNAR JOINT DEBRIDEMENT AND ARTHROPLASTY;  Surgeon: Bradly Bienenstock, MD;  Location: [facility];  Service: Orthopedics;  Laterality: Right;  BLOCK WITH SEDATION  . REPAIR UNDESENDED RIGHT TESTICLE / RIGHT INGUINAL HERNIA  AGE 33  . RIGHT ANKLE ARTHROSCOPY W/ EXTENSIVE DEBRIDEMENT  04/05/2008   x2  . RIGHT SHOULDER SURGERY  2006  . RIGHT SUPRAOMOHYOID NECK DISSECTION   03-08-2003   ZONES 1,2,3;   SUBMANDIBULAR MASS / METASTATIC SQUAMOUS CELL CARCINOMA RIGHT NECK  . SAVORY DILATION N/A 02/24/2018   Procedure: SAVORY DILATION;  Surgeon: Carman Ching, MD;  Location: [facility];  Service:  Endoscopy;  Laterality: N/A;  . SEPTOPLASTY  NOV 2006  . SHOULDER ARTHROSCOPY Left   . SHOULDER ARTHROSCOPY W/ SUBACROMIAL DECOMPRESSION AND DISTAL CLAVICLE EXCISION  10-09-2008   AND DEBRIDEMENT OF RIGHT SHOULDER IMPINGEMENT & [facility] JOINT ARTHRITIS  . SPINE SURGERY  2016   l 3 TO l 4 PLATE AND SCREWS  . TOTAL THYROIDECTOMY  11-03-2001   PAPILLARY THYROID CARCINOMA  . TRANSTHORACIC ECHOCARDIOGRAM  12/ 2012   grade I diastolic dysfunction/ ef 55-60%  . ULNAR NERVE TRANSPOSITION Right 04/28/2014   Procedure: RIGHT ELBOW ULNA NERVE RELEASE TRANSPOSTION AND MEDIAL EPICONDYLAR DEBRIDEMENT AND REPAIR;  Surgeon: Sharma Covert, MD;  Location: [facility];  Service: Orthopedics;  Laterality: Right;    FAMILY HISTORY Family History  Problem Relation Age of Onset  . Depression Sister   . Rectal cancer Sister   . Lung cancer Brother   . Kidney disease Mother   . Depression Mother   . Stroke Father   . COPD Father   . Hypertension Father   . Prostate cancer Father   . Depression Daughter   . Drug abuse Daughter   . Psychiatric Illness Son     SOCIAL HISTORY: (Updated 08/04/2018) Alex [name] is disabled secondary to his multiple medical problems. His wife Marylu Lund works for [employer]. He has four children: Fuller Song, Christiane Ha, and one other. Luther Parody is 64 and goes to [employer] (as of 07/2018). Christiane Ha has graduated from [employer] with a Chief Operating Officer in Hotel manager, but he works in Holiday representative. Two of his younger children are still home. One of his children unfortunately suffers from paraphilia. The fourth child is "our problem daughter" and she is also back home with the family.    ADVANCED DIRECTIVES: Not in place  HEALTH MAINTENANCE: Social History   Tobacco Use  . Smoking status: Never Smoker  . Smokeless tobacco: Never Used  Substance Use Topics  . Alcohol use: Yes    Alcohol/week: 0.0 standard drinks    Comment: 1-2 glasses per month  . Drug use: No      Allergies  Allergen Reactions  . Percocet [Oxycodone-Acetaminophen] Itching    Can take generic.  States only has a problem with percocet brand, also headache  . Vicodin [Hydrocodone-Acetaminophen]     Itching and sleep disruption, can take generic    Current Outpatient Medications  Medication Sig Dispense Refill  . acetaminophen (TYLENOL) 500 MG tablet Take 1,000 mg by mouth 2 (two) times daily as needed for headache.    [name] amLODipine (NORVASC) 10 MG tablet Take 1 tablet (10 mg total)  by mouth daily. (Patient taking differently: Take 10 mg by mouth every evening. ) 90 tablet 4  . amoxicillin-clavulanate (AUGMENTIN) 875-125 MG tablet Take 1 tablet by mouth 2 (two) times daily. 20 tablet 0  . aspirin EC 81 MG tablet Take 1 tablet by mouth daily.    Randa Ngo 30 MG/ACT SOLN Place 2 Act onto the skin daily.    . budesonide-formoterol (SYMBICORT) 80-4.5 MCG/ACT inhaler Inhale 2 puffs into the lungs 2 (two) times daily. (Patient taking differently: Inhale 2 puffs into the lungs 2 (two) times daily as needed (Bronchitis). ) 1 Inhaler 0  . Coenzyme Q10 (CO Q 10 PO) Take 1 capsule by mouth daily.     . fluticasone (FLONASE) 50 MCG/ACT nasal spray Place 2 sprays into both nostrils daily. 16 g 6  . Immune Globulin, Human, (HIZENTRA) 4 GM/20ML SOLN Inject into the skin once a week. wednesday    . LamoTRIgine 300 MG TB24 24 hour tablet Take 1 tablet (300 mg total) by mouth daily. (Patient taking differently: Take 1 tablet by mouth every morning. ) 90 tablet 0  . latanoprost (XALATAN) 0.005 % ophthalmic solution 1 DROP EACH EYE AT NIGHT    . levalbuterol (XOPENEX HFA) 45 MCG/ACT inhaler Inhale 2 puffs into the lungs every 6 (six) hours as needed for wheezing. 1 Inhaler 5  . levothyroxine (SYNTHROID, LEVOTHROID) 150 MCG tablet Take 1 tablet (150 mcg total) by mouth daily before breakfast. 90 tablet 0  . lidocaine-prilocaine (EMLA) cream Apply 1 application topically as needed.     [name] LORazepam  (ATIVAN) 0.5 MG tablet Take 1 tab daily as needed and 1 tab at bed time 60 tablet 2  . LUTEIN PO Take 1 tablet by mouth daily.     [name] LYCOPENE PO Take by mouth.    . meloxicam (MOBIC) 15 MG tablet Take 1 tablet (15 mg total) by mouth daily as needed. 30 tablet 2  . Multiple Vitamin (MULTI-VITAMINS) TABS Take by mouth.    . OMEGA-3 KRILL OIL PO Take 1 capsule by mouth daily.     [name] omeprazole (PRILOSEC) 20 MG capsule Take 20 mg by mouth every evening.    . pseudoephedrine (SUDAFED) 30 MG tablet Take 60 mg by mouth daily as needed for congestion.    [name] Respiratory Therapy Supplies (FLUTTER) DEVI Use as directed 1 each 0  . Saw Palmetto, Serenoa repens, (SAW PALMETTO PO) Take by mouth.    . sildenafil (VIAGRA) 25 MG tablet Take 5 mg by mouth daily as needed (BPH).     [name] Spacer/Aero-Holding Chambers (AEROCHAMBER MV) inhaler Use as instructed 1 each 0  . sucralfate (CARAFATE) 1 G tablet Take 1 g by mouth daily as needed (ACID REFLUX).   3  . tadalafil (ADCIRCA/CIALIS) 20 MG tablet Take 20 mg by mouth daily as needed for erectile dysfunction.    [name] telmisartan (MICARDIS) 80 MG tablet TAKE 1 TABLET (80 MG TOTAL) BY MOUTH DAILY. (Patient taking differently: Take 80 mg by mouth every morning. ) 90 tablet 3  . zolpidem (AMBIEN) 10 MG tablet Take 1 tablet (10 mg total) by mouth at bedtime as needed. for sleep 30 tablet 1   No current facility-administered medications for this visit.     OBJECTIVE: Middle-aged white man who appears stated age  Vitals:   08/04/18 1503  BP: 124/83  Pulse: 88  Resp: 18  Temp: 98.4 F (36.9 C)  SpO2: 97%     Body mass index is 24.59  kg/m.    ECOG FS:1 - Symptomatic but completely ambulatory  Sclerae injected but unicteric, EOMs intact, pupils round and reactive No cervical or supraclavicular adenopathy Lungs no rales or rhonchi Heart regular rate and rhythm Abd soft, nontender, positive bowel sounds MSK no focal spinal tenderness, no upper extremity  lymphedema Neuro: nonfocal, well oriented, restless affect  LAB RESULTS:  CMP     Component Value Date/Time   NA 139 06/15/2018 1242   NA 141 05/12/2018 1032   NA 139 06/27/2017 0950   K 3.9 06/15/2018 1242   K 4.0 06/27/2017 0950   CL 102 06/15/2018 1242   CO2 27 06/08/2018 1417   CO2 23 06/27/2017 0950   GLUCOSE 101 (H) 06/15/2018 1242   GLUCOSE 93 06/27/2017 0950   GLUCOSE 95 06/04/2006 0825   BUN 22 06/15/2018 1242   BUN 15 05/12/2018 1032   BUN 19.2 06/27/2017 0950   CREATININE 0.90 06/15/2018 1242   CREATININE 1.27 (H) 06/08/2018 1417   CREATININE 1.1 06/27/2017 0950   CALCIUM 9.0 06/08/2018 1417   CALCIUM 8.8 06/27/2017 0950   PROT 7.3 06/08/2018 1417   PROT 6.9 05/12/2018 1032   PROT 6.9 06/27/2017 0950   ALBUMIN 4.1 06/08/2018 1417   ALBUMIN 4.8 05/12/2018 1032   ALBUMIN 4.1 06/27/2017 0950   AST 31 06/08/2018 1417   AST 26 06/27/2017 0950   ALT 39 06/08/2018 1417   ALT 40 06/27/2017 0950   ALKPHOS 77 06/08/2018 1417   ALKPHOS 74 06/27/2017 0950   BILITOT 0.7 06/08/2018 1417   BILITOT 1.05 06/27/2017 0950   GFRNONAA 58 (L) 06/08/2018 1417   GFRAA >60 06/08/2018 1417    INo results found for: SPEP, UPEP  Lab Results  Component Value Date   WBC 10.0 06/08/2018   NEUTROABS 6.5 06/08/2018   HGB 17.3 (H) 06/15/2018   HCT 51.0 06/15/2018   MCV 80.9 06/08/2018   PLT 226 06/08/2018      Chemistry      Component Value Date/Time   NA 139 06/15/2018 1242   NA 141 05/12/2018 1032   NA 139 06/27/2017 0950   K 3.9 06/15/2018 1242   K 4.0 06/27/2017 0950   CL 102 06/15/2018 1242   CO2 27 06/08/2018 1417   CO2 23 06/27/2017 0950   BUN 22 06/15/2018 1242   BUN 15 05/12/2018 1032   BUN 19.2 06/27/2017 0950   CREATININE 0.90 06/15/2018 1242   CREATININE 1.27 (H) 06/08/2018 1417   CREATININE 1.1 06/27/2017 0950      Component Value Date/Time   CALCIUM 9.0 06/08/2018 1417   CALCIUM 8.8 06/27/2017 0950   ALKPHOS 77 06/08/2018 1417   ALKPHOS 74  06/27/2017 0950   AST 31 06/08/2018 1417   AST 26 06/27/2017 0950   ALT 39 06/08/2018 1417   ALT 40 06/27/2017 0950   BILITOT 0.7 06/08/2018 1417   BILITOT 1.05 06/27/2017 0950       No results found for: LABCA2  No components found for: LABCA125  No results for input(s): INR in the last 168 hours.  Urinalysis    Component Value Date/Time   COLORURINE YELLOW 01/22/2017 1056   APPEARANCEUR CLEAR 01/22/2017 1056   LABSPEC 1.025 01/22/2017 1056   PHURINE 6.0 01/22/2017 1056   GLUCOSEU NEGATIVE 01/22/2017 1056   HGBUR SMALL (A) 01/22/2017 1056   BILIRUBINUR NEGATIVE 01/22/2017 1056   KETONESUR NEGATIVE 01/22/2017 1056   PROTEINUR NEGATIVE 07/23/2016 1111   UROBILINOGEN 0.2 01/22/2017 1056   NITRITE NEGATIVE  01/22/2017 1056   LEUKOCYTESUR NEGATIVE 01/22/2017 1056    STUDIES: Ct Soft Tissue Neck W Contrast  Result Date: 07/20/2018 CLINICAL DATA:  History of thyroid cancer with thyroidectomy in 2002. Balance disturbance and hearing loss. History of squamous cell cancer the orbit EXAM: CT NECK WITH CONTRAST TECHNIQUE: Multidetector CT imaging of the neck was performed using the standard protocol following the bolus administration of intravenous contrast. CONTRAST:  75mL OMNIPAQUE IOHEXOL 300 MG/ML  SOLN COMPARISON:  MRI 07/20/2018 FINDINGS: Pharynx and larynx: No mucosal or submucosal lesion. Salivary glands: Left submandibular gland is normal. Right submandibular gland is atrophic or resected. Parotid glands are normal. Thyroid: Previous thyroidectomy. No evidence of residual thyroid tissue. Lymph nodes: No enlarged nodes seen on either side of the neck. Vascular: Carotid artery atherosclerosis.  Jugular veins are patent. Limited intracranial: Normal Visualized orbits: Normal Mastoids and visualized paranasal sinuses: Cyst or polyp in the right division of the sphenoid sinus. Chronic mucoperiosteal thickening of the sphenoid sinuses. No fluid in the middle ears or mastoids. Skeleton:  Mid cervical spondylosis. Upper chest: Normal Other: None IMPRESSION: Previous thyroidectomy. No evidence residual or recurrent thyroid disease. Absent or atrophic right submandibular gland. Electronically Signed   By: Paulina Fusi [name]   On: 07/20/2018 15:06   Mr [name] TD Contrast  Result Date: 07/20/2018 CLINICAL DATA:  Non intractable headache. History of squamous cell cancer of orbit. Vision change in corneal hemorrhage. Hearing loss mixed conductive and sensorineural. History of thyroid cancer. EXAM: MRI HEAD WITHOUT AND WITH CONTRAST TECHNIQUE: Multiplanar, multiecho pulse sequences of the brain and surrounding structures were obtained without and with intravenous contrast. CONTRAST:  8 mL Gadovist IV COMPARISON:  MRI orbit 10/28/2014.  MRI head 01/05/2012 FINDINGS: Brain: Ventricle size is normal. Cerebral volume normal for age. Scattered small white matter hyperintensities appear chronic and have progressed since 2013. No acute infarct. Negative for hemorrhage or mass. Normal enhancement.  No enhancing mass lesion identified. 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 Normal arterial flow voids Skull and upper cervical spine: Negative Sinuses/Orbits: Mild mucosal edema in the paranasal sinuses. Left orbit normal. Right cataract surgery. No orbital mass lesion. Detailed fat-suppressed images the orbit were not obtained today. Other: None IMPRESSION: No acute abnormality. No recurrent mass lesion in the orbit. Mild chronic microvascular ischemic changes have progressed since 2013 Electronically Signed   By: Marlan Palau [name]   On: 07/20/2018 14:27    ASSESSMENT: 62 y.o. [place] man  (1) status post right periorbital biopsy of the squamous cell carcinoma in November 2002, status post resection and 64 gray of radiation completed February 2003  (2) recurrence to the right perirbital lymph node drainage area, status post right supraomohyoid neck  dissection July 2004 with 1 of 8 lymph nodes positive (a) completed adjuvant radiation October 2004, 49 Gy to the upper neck, given with sensitizing Erbitux and cisplatin  (3) status post thyroidectomy for papillary thyroid carcinoma, with postoperative iodine-131 May 2003 (a) on chronic Synthroid supplementation, most recent TSH 1.73 (06/06/2014)  (4) likely alpha thalassemia trait with baseline MCV 79-81 in setting of normal hemoglobin  (5) iron deficiency secondary to gastritis/esophagitis, s/p feraheme, last dose 10/27/2015   PLAN: Alex [name] is now 17 years out from initial diagnosis of his peri-orbital squamous cell carcinoma.  There is no evidence of disease recurrence.  He is 16 years out from his thyroidectomy with no evidence of thyroid cancer  At this point I am comfortable  seeing him on an as-needed basis.  He has excellent follow-up at [facility] for his unusual liver findings and for the very small liver cyst, which hopefully will prove benign with long-term follow-up.  I am hoping his sinus issues will resolve with symptomatic treatment and I have suggested that he exercise enough to sweat for about 10 minutes at a time which can help the sinuses drain somewhat  We did discuss his family situation which continues to be stressful  At this point I have not made him a return appointment here but I will be glad to see him at any point in the future if and when the need arises.   Ramses Klecka, Valentino Hue, MD  08/04/18 3:48 PM Medical Oncology and Hematology [facility] [address] [place], [name] [address] Tel. [phone]    Fax. [phone]    I, Mal Misty am acting as a Neurosurgeon for Lowella Dell, MD.   I, Ruthann Cancer MD, have reviewed the above documentation for accuracy and completeness, and I agree with the above.

## 2018-08-04 ENCOUNTER — Telehealth: Payer: Self-pay | Admitting: Oncology

## 2018-08-04 ENCOUNTER — Inpatient Hospital Stay: Payer: Managed Care, Other (non HMO) | Attending: Oncology | Admitting: Oncology

## 2018-08-04 VITALS — BP 124/83 | HR 88 | Temp 98.4°F | Resp 18 | Ht 72.0 in | Wt 181.3 lb

## 2018-08-04 DIAGNOSIS — I2510 Atherosclerotic heart disease of native coronary artery without angina pectoris: Secondary | ICD-10-CM | POA: Diagnosis not present

## 2018-08-04 DIAGNOSIS — K219 Gastro-esophageal reflux disease without esophagitis: Secondary | ICD-10-CM | POA: Insufficient documentation

## 2018-08-04 DIAGNOSIS — Z85850 Personal history of malignant neoplasm of thyroid: Secondary | ICD-10-CM

## 2018-08-04 DIAGNOSIS — Z923 Personal history of irradiation: Secondary | ICD-10-CM | POA: Diagnosis not present

## 2018-08-04 DIAGNOSIS — C73 Malignant neoplasm of thyroid gland: Secondary | ICD-10-CM

## 2018-08-04 DIAGNOSIS — Z801 Family history of malignant neoplasm of trachea, bronchus and lung: Secondary | ICD-10-CM | POA: Insufficient documentation

## 2018-08-04 DIAGNOSIS — H3530 Unspecified macular degeneration: Secondary | ICD-10-CM | POA: Diagnosis not present

## 2018-08-04 DIAGNOSIS — Z800 Family history of malignant neoplasm of digestive organs: Secondary | ICD-10-CM | POA: Insufficient documentation

## 2018-08-04 DIAGNOSIS — Z79899 Other long term (current) drug therapy: Secondary | ICD-10-CM

## 2018-08-04 DIAGNOSIS — Z7982 Long term (current) use of aspirin: Secondary | ICD-10-CM | POA: Insufficient documentation

## 2018-08-04 DIAGNOSIS — D509 Iron deficiency anemia, unspecified: Secondary | ICD-10-CM | POA: Diagnosis not present

## 2018-08-04 DIAGNOSIS — M1990 Unspecified osteoarthritis, unspecified site: Secondary | ICD-10-CM

## 2018-08-04 DIAGNOSIS — Z85840 Personal history of malignant neoplasm of eye: Secondary | ICD-10-CM | POA: Insufficient documentation

## 2018-08-04 DIAGNOSIS — E785 Hyperlipidemia, unspecified: Secondary | ICD-10-CM | POA: Insufficient documentation

## 2018-08-04 DIAGNOSIS — N400 Enlarged prostate without lower urinary tract symptoms: Secondary | ICD-10-CM

## 2018-08-04 DIAGNOSIS — C6960 Malignant neoplasm of unspecified orbit: Secondary | ICD-10-CM

## 2018-08-04 DIAGNOSIS — I10 Essential (primary) hypertension: Secondary | ICD-10-CM | POA: Diagnosis not present

## 2018-08-04 DIAGNOSIS — Z8051 Family history of malignant neoplasm of kidney: Secondary | ICD-10-CM | POA: Insufficient documentation

## 2018-08-04 NOTE — Telephone Encounter (Signed)
Per 12/24 no los 

## 2018-08-10 ENCOUNTER — Ambulatory Visit (HOSPITAL_COMMUNITY): Payer: 59 | Admitting: Licensed Clinical Social Worker

## 2018-08-17 ENCOUNTER — Ambulatory Visit (HOSPITAL_COMMUNITY): Payer: 59 | Admitting: Psychiatry

## 2018-08-18 ENCOUNTER — Ambulatory Visit (HOSPITAL_COMMUNITY): Payer: 59 | Admitting: Licensed Clinical Social Worker

## 2018-08-18 ENCOUNTER — Encounter (HOSPITAL_COMMUNITY): Payer: Self-pay | Admitting: Licensed Clinical Social Worker

## 2018-08-18 DIAGNOSIS — F3162 Bipolar disorder, current episode mixed, moderate: Secondary | ICD-10-CM

## 2018-08-18 NOTE — Progress Notes (Signed)
Therapist Progress Note   Time: 1:10-2pm  Participation Level: Active  Behavioral Response: Casual/Alert/ Depressed  Type of Therapy: Individual Therapy  Treatment Goals Addressed: Improve Psychiatric Symptoms, elevate mood (increased self-esteem, increased self-compassion, increased interaction), improve unhelpful thought patterns, controlled behavior, moderate mood, deliberate speech and thought process(improved social functioning, healthy adjustment to living situation), Learn about diagnosis, healthy coping skills.  Interventions: Supportive: CBT  Summary: Pt presented for individual therapy. Pt discussed his psychiatric symptoms and current life events.  He presents depressed today. Pt "filled me in" on all his medical tests and physical issues. He has had several tests done within he last 30 days.   He continues to have the same stressors: children, health, $.  Pt shared "I have several health issues that are being affected by the amount of stress I;m under." Pt is not willing to change or do anything different to remove some of the stressors in his life. He discussed his holidays with the family, where he experienced a lot of stress.  He was able to use some of his coping skills to deal with the immediate stress but his external stressors continue.  Suicidal/Homicidal: No/ without plan  Therapist Response: Assessed pt's current functioning and reviewed progress. Assisted pt processing family issues, health issues, external stressors. Assisted pt  processing for management of stressors.  Plan: Return in 2 weeks using CBT  Diagnosis: Axis 1: Bipolar 1 disorder,   [name], LCAS 08/18/2018

## 2018-08-19 ENCOUNTER — Ambulatory Visit (INDEPENDENT_AMBULATORY_CARE_PROVIDER_SITE_OTHER): Payer: 59 | Admitting: Psychiatry

## 2018-08-19 ENCOUNTER — Encounter (HOSPITAL_COMMUNITY): Payer: Self-pay | Admitting: Psychiatry

## 2018-08-19 DIAGNOSIS — F3162 Bipolar disorder, current episode mixed, moderate: Secondary | ICD-10-CM

## 2018-08-19 DIAGNOSIS — F411 Generalized anxiety disorder: Secondary | ICD-10-CM

## 2018-08-19 MED ORDER — LORAZEPAM 0.5 MG PO TABS: ORAL_TABLET | ORAL | 2 refills | Status: DC

## 2018-08-19 MED ORDER — LAMOTRIGINE ER 300 MG PO TB24: 1.0000 | ORAL_TABLET | ORAL | 0 refills | Status: DC

## 2018-08-19 MED ORDER — MIRTAZAPINE 15 MG PO TABS: 15.0000 mg | ORAL_TABLET | Freq: Every day | ORAL | 1 refills | Status: DC

## 2018-08-19 NOTE — Progress Notes (Signed)
[place] MD/PA/NP OP Progress Note  08/19/2018 10:59 AM Alex [name]  MRN:  [id number]  Chief Complaint: I have a lot of stress.  Some nights I do not sleep well.  I have anxiety.  I still struggle with my family stress.  HPI: Alex [name] came for his follow-up appointment.  Recently his father has been very sick and he is very concerned about his health.  His father is 65 year old lives in [place].  He continues to have issues with her daughter who does not work and patient complain of financial problems.  He is taking Ambien but some nights he has to take higher dose because he cannot sleep.  He feels very anxious but denies any irritability, anger, mania or any psychosis.  He feel the Lamictal is helping his mood and irritability.  He denies any suicidal thoughts or homicidal thought.  His appetite is okay.  He has no tremors shakes or any EPS.  He denies drinking or using any illegal substances.  He is seeing [name] for therapy.  His energy level is okay.  Visit Diagnosis:    ICD-10-CM   1. Bipolar 1 disorder, mixed, moderate (HCC) F31.62 LamoTRIgine 300 MG TB24 24 hour tablet  2. GAD (generalized anxiety disorder) F41.1 LORazepam (ATIVAN) 0.5 MG tablet    mirtazapine (REMERON) 15 MG tablet    Past Psychiatric History: Reviewed. History of depression mood swing and anger. Inpatient at behavioral [facility] due to suicidal thoughts but no attempt. No history of psychosis but had poor impulse control. In the past took Seroquel and Cymbalta  Past Medical History:  Past Medical History:  Diagnosis Date  . Anemia   . Anxiety   . Bipolar I disorder ([place])   . Bleeding ulcer 2016  . BPH (benign prostatic hypertrophy) with urinary obstruction   . 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 Cataract    LEFT EYE  . Chronic back pain   . Complication of anesthesia POST URINARY RETENTION---  2006 SHOULDER SURGERY MARKED BRADYCARDIA VAGAL RESPONSE NO ISSUE W/  SURGERY AFTER THIS ONE   WITH GENERAL ANESTHESIA, 15 YRS AGO VASOVAGAL REACTION NONE SINCE  . 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 Coronary atherosclerosis CARDIOLOGIST- DR CRENSHAW--  LAST VISIT 01-05-2012 IN [facility]   NON-OBSTRUCTIVE MILD DISEASE  . 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 Depression   . Epicondylitis    right elbow  . GERD (gastroesophageal reflux disease)   . Glaucoma BOTH EYES   RIGHT EYE RADIATION DAMAGE  . Hearing loss    BOTH EARS  . Hearing loss    Bilateral  . Hepatic cyst    Several, The lesion of concern in segment 6 of the liver has single large portal vein and hepatic vein branches extending to tt, in a pattern of enhancement which mirrors these vascular structures. 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 These can be seen in normal patients and also on patient's with portal venous hypertension and in this case the lesion   . History of chronic prostatitis   . History of deviated nasal septum   . History of hiatal hernia    SMALL  . History of kidney stones   . 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 History of thyroid cancer PRIMARY (NO METS FROM ORBITAL CANCER)--   IN REMISSION   S/P TOTAL THYROIDECTOMY  , CHEMORADIATION  (ONCOLOGIST -- DR Griffith Citron)  . Hyperlipidemia   . Hypertension   . Macular degeneration    Left  . Nocturia   . OA (osteoarthritis)   . Pancreas cyst   . Peripheral vascular disease (HCC)    THORACIC AA 3. 9 CM X 4. 3 CM PER NOV 06-14-17  CHEST CTFOLOWED BY DR CRENSHAW YEARLY FOR  . Positional vertigo    HX OF WITH SINUS INFECTIONS  . Radial head fracture    Right  . Thoracic aortic aneurysm ([place]) 06/14/2017   last CT 4.1 CM Mild  . Tinnitus    CONSTANT  . Ulnar nerve compression    right elbow  .  Unsteady gait    especially with stairs, depth perception off  . Urinary hesitancy   . Wears glasses     Past Surgical History:  Procedure Laterality Date  . CARDIAC CATHETERIZATION  01-16-2006  DR Marcello Moores WALL   MILD CORONARY ATHEROSCLEROSIS/ MID TO DISTAL LAD 40% STENOSIS/ LVF 50-55%  . CARPAL TUNNEL RELEASE Right 11/03/2017   Procedure: RIGHT HAND CARPAL TUNNEL RELEASE;  Surgeon: Iran Planas, MD;  Location: [facility];  Service: Orthopedics;  Laterality: Right;  . CATARACT EXTRACTION Right   . COLONSCOPY  2017 LAST DONE   MULTIPLE  . ENDOSCOPY  LAST 2017   MULTIPLE DONE DILATION DONE ALSO  . ESOPHAGOGASTRODUODENOSCOPY (EGD) WITH PROPOFOL N/A 02/24/2018   Procedure: ESOPHAGOGASTRODUODENOSCOPY (EGD) WITH PROPOFOL;  Surgeon: Laurence Spates, MD;  Location: [facility];  Service: Endoscopy;  Laterality: N/A;  . EXCISION RADIAL HEAD Right 11/03/2017   Procedure: RIGHT PROXIMAL RADIUS RADIAL HEAD RESECTION AND JOINT DEBRIDEMENT;  Surgeon: Iran Planas, MD;  Location: [facility];  Service: Orthopedics;  Laterality: Right;  . KNEE ARTHROSCOPY  05/01/2012   Procedure: ARTHROSCOPY KNEE;  Surgeon: Johnn Hai, MD;  Location: [facility];  Service: Orthopedics;  Laterality: Left;  debridement and removal of loose body  . LEFT ANKLE ARTHROSCOPY W/ DEBRIDEMENT  05-12-2007  . LEFT HYDROCELECTOMY  03-29-2005   AND REPAIR LEFT INGUINAL HERNIA W/ MESH  . MOHS SURGERY  2002   RIGHT ORBITAL CANCER  . NASAL ENDOSCOPY  08-07-2005   RIGHT EPISTAXIS  / POST SEPTOPLASTY  (HX RIGHT ORBITAL CA & S/P RADIATION/ NECROSIS ANTERIOR END OF BOTH INFERIOR TURBINATES)  . occuloplastic surgery  2002  . PARS PLANA VITRECTOMY  11-06-2004   RIGHT EYE RADIATION RETINOPATHY W/ HEMORRHAGE  . RADIAL HEAD ARTHROPLASTY Right 06/15/2018   Procedure: RIGHT ELBOW PROXIMAL RADIOULNAR JOINT DEBRIDEMENT AND ARTHROPLASTY;  Surgeon: Iran Planas, MD;  Location: [facility];  Service: Orthopedics;  Laterality: Right;  BLOCK WITH SEDATION  . REPAIR UNDESENDED RIGHT TESTICLE / RIGHT INGUINAL HERNIA  AGE 1  . RIGHT ANKLE ARTHROSCOPY W/ EXTENSIVE DEBRIDEMENT  04/05/2008   x2  . RIGHT SHOULDER SURGERY  2006  . RIGHT SUPRAOMOHYOID NECK DISSECTION   03-08-2003   ZONES 1,2,3;   SUBMANDIBULAR MASS / METASTATIC SQUAMOUS CELL CARCINOMA RIGHT NECK  . SAVORY DILATION N/A 02/24/2018   Procedure: SAVORY DILATION;  Surgeon: Laurence Spates, MD;  Location: [facility];  Service: Endoscopy;  Laterality: N/A;  . SEPTOPLASTY  NOV 2006  . SHOULDER ARTHROSCOPY Left   . SHOULDER ARTHROSCOPY W/ SUBACROMIAL DECOMPRESSION AND DISTAL CLAVICLE  EXCISION  10-09-2008   AND DEBRIDEMENT OF RIGHT SHOULDER IMPINGEMENT & [facility] JOINT ARTHRITIS  . SPINE SURGERY  2016   l 3 TO l 4 PLATE AND SCREWS  . TOTAL THYROIDECTOMY  11-03-2001   PAPILLARY THYROID CARCINOMA  . TRANSTHORACIC ECHOCARDIOGRAM  12/ 2012   grade I diastolic dysfunction/ ef 92-42%  . ULNAR NERVE TRANSPOSITION Right 04/28/2014   Procedure: RIGHT ELBOW ULNA NERVE RELEASE TRANSPOSTION AND MEDIAL EPICONDYLAR DEBRIDEMENT AND REPAIR;  Surgeon: Linna Hoff, MD;  Location: [facility];  Service: Orthopedics;  Laterality: Right;    Family Psychiatric History: Reviewed.  Family History:  Family History  Problem Relation Age of Onset  . Depression Sister   . Rectal cancer Sister   . Lung cancer Brother   . Kidney disease Mother   . Depression Mother   . Stroke Father   . COPD Father   . Hypertension Father   . Prostate cancer Father   . Depression Daughter   . Drug abuse Daughter   . Psychiatric Illness Son     Social History:  Social History   Socioeconomic History  . Marital status: Married    Spouse name: Not on file  . Number of children: Not on file  . Years of education: Not on file  . Highest education level: Not on file  Occupational History  . Not on file  Social Needs  . Financial  resource strain: Not on file  . Food insecurity:    Worry: Not on file    Inability: Not on file  . Transportation needs:    Medical: Not on file    Non-medical: Not on file  Tobacco Use  . Smoking status: Never Smoker  . Smokeless tobacco: Never Used  Substance and Sexual Activity  . Alcohol use: Yes    Alcohol/week: 0.0 standard drinks    Comment: 1-2 glasses per month  . Drug use: No  . Sexual activity: Yes    Partners: Female    Birth control/protection: None  Lifestyle  . Physical activity:    Days per week: Not on file    Minutes per session: Not on file  . Stress: Not on file  Relationships  . Social connections:    Talks on phone: Not on file    Gets together: Not on file    Attends religious service: Not on file    Active member of club or organization: Not on file    Attends meetings of clubs or organizations: Not on file    Relationship status: Not on file  Other Topics Concern  . Not on file  Social History Narrative   Originally from [place]. Moved to [place] in 1984. 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 No international travel other than [place]. Has a dog at home, poodle mix. His daughter who lives with him now has a dog. No mold exposure. No bird exposure. No hot tub exposure. Enjoys gardening.     Allergies:  Allergies  Allergen Reactions  . Percocet [Oxycodone-Acetaminophen] Itching    Can take generic.  States only has a problem with percocet brand, also headache  . Vicodin [Hydrocodone-Acetaminophen]     Itching and sleep disruption, can take generic    Metabolic Disorder Labs: Lab Results  Component Value Date   HGBA1C 6.1 08/04/2012   MPG 123 (H)  01/05/2012   No results found for: PROLACTIN Lab Results  Component Value Date   CHOL 166 05/12/2018   TRIG 85 05/12/2018   HDL 74 05/12/2018   CHOLHDL 2.2  05/12/2018   VLDL 35 (H) 07/23/2016   LDLCALC 75 05/12/2018   LDLCALC 42 07/23/2016   Lab Results  Component Value Date   TSH 2.08 02/13/2018   TSH 6.25 (H) 11/17/2017    Therapeutic Level Labs: No results found for: LITHIUM No results found for: VALPROATE No components found for:  CBMZ  Current Medications: Current Outpatient Medications  Medication Sig Dispense Refill  . acetaminophen (TYLENOL) 500 MG tablet Take 1,000 mg by mouth 2 (two) times daily as needed for headache.    [name] amLODipine (NORVASC) 10 MG tablet Take 1 tablet (10 mg total) by mouth daily. (Patient taking differently: Take 10 mg by mouth every evening. ) 90 tablet 4  . amoxicillin-clavulanate (AUGMENTIN) 875-125 MG tablet Take 1 tablet by mouth 2 (two) times daily. 20 tablet 0  . aspirin EC 81 MG tablet Take 1 tablet by mouth daily.    Hinda Kehr 30 MG/ACT SOLN Place 2 Act onto the skin daily.    . budesonide-formoterol (SYMBICORT) 80-4.5 MCG/ACT inhaler Inhale 2 puffs into the lungs 2 (two) times daily. (Patient taking differently: Inhale 2 puffs into the lungs 2 (two) times daily as needed (Bronchitis). ) 1 Inhaler 0  . Coenzyme Q10 (CO Q 10 PO) Take 1 capsule by mouth daily.     . fluticasone (FLONASE) 50 MCG/ACT nasal spray Place 2 sprays into both nostrils daily. 16 g 6  . Immune Globulin, Human, (HIZENTRA) 4 GM/20ML SOLN Inject into the skin once a week. wednesday    . LamoTRIgine 300 MG TB24 24 hour tablet Take 1 tablet (300 mg total) by mouth daily. (Patient taking differently: Take 1 tablet by mouth every morning. ) 90 tablet 0  . latanoprost (XALATAN) 0.005 % ophthalmic solution 1 DROP EACH EYE AT NIGHT    . levalbuterol (XOPENEX HFA) 45 MCG/ACT inhaler Inhale 2 puffs into the lungs every 6 (six) hours as needed for wheezing. 1 Inhaler 5  . levothyroxine (SYNTHROID, LEVOTHROID) 150 MCG tablet Take 1 tablet (150 mcg total) by mouth daily before breakfast. 90 tablet 0  . lidocaine-prilocaine (EMLA) cream Apply  1 application topically as needed.     [name] LORazepam (ATIVAN) 0.5 MG tablet Take 1 tab daily as needed and 1 tab at bed time 60 tablet 2  . LUTEIN PO Take 1 tablet by mouth daily.     [name] LYCOPENE PO Take by mouth.    . meloxicam (MOBIC) 15 MG tablet Take 1 tablet (15 mg total) by mouth daily as needed. 30 tablet 2  . Multiple Vitamin (MULTI-VITAMINS) TABS Take by mouth.    . OMEGA-3 KRILL OIL PO Take 1 capsule by mouth daily.     [name] omeprazole (PRILOSEC) 20 MG capsule Take 20 mg by mouth every evening.    . pseudoephedrine (SUDAFED) 30 MG tablet Take 60 mg by mouth daily as needed for congestion.    [name] Respiratory Therapy Supplies (FLUTTER) DEVI Use as directed 1 each 0  . Saw Palmetto, Serenoa repens, (SAW PALMETTO PO) Take by mouth.    . sildenafil (VIAGRA) 25 MG tablet Take 5 mg by mouth daily as needed (BPH).     [name] Spacer/Aero-Holding Chambers (AEROCHAMBER MV) inhaler Use as instructed 1 each 0  . sucralfate (CARAFATE) 1 G tablet Take 1 g  by mouth daily as needed (ACID REFLUX).   3  . tadalafil (ADCIRCA/CIALIS) 20 MG tablet Take 20 mg by mouth daily as needed for erectile dysfunction.    [name] telmisartan (MICARDIS) 80 MG tablet TAKE 1 TABLET (80 MG TOTAL) BY MOUTH DAILY. (Patient taking differently: Take 80 mg by mouth every morning. ) 90 tablet 3  . zolpidem (AMBIEN) 10 MG tablet Take 1 tablet (10 mg total) by mouth at bedtime as needed. for sleep 30 tablet 1   No current facility-administered medications for this visit.      Musculoskeletal: Strength & Muscle Tone: within normal limits Gait & Station: normal Patient leans: N/A  Psychiatric Specialty Exam: ROS  There were no vitals taken for this visit.There is no height or weight on file to calculate BMI.  General Appearance: Casual  Eye Contact:  Good  Speech:  Slow  Volume:  Normal  Mood:  Anxious  Affect:  Congruent  Thought Process:  Goal Directed  Orientation:  Full (Time, Place, and Person)  Thought Content: Rumination    Suicidal Thoughts:  No  Homicidal Thoughts:  No  Memory:  Immediate;   Good Recent;   Good Remote;   Good  Judgement:  Good  Insight:  Good  Psychomotor Activity:  Normal  Concentration:  Concentration: Fair and Attention Span: Fair  Recall:  Good  Fund of Knowledge: Good  Language: Good  Akathisia:  No  Handed:  Right  AIMS (if indicated): not done  Assets:  Communication Skills Desire for Improvement Housing Resilience  ADL's:  Intact  Cognition: WNL  Sleep:  Fair   Screenings: GAD-7     Office Visit from 08/01/2017 in [facility]  Total GAD-7 Score  6    PHQ2-9     Office Visit from 08/01/2017 in [facility]  PHQ-2 Total Score  2  PHQ-9 Total Score  4       Assessment and Plan: Bipolar disorder type I.  Generalized anxiety disorder.  Discuss hypnotics abuse tolerance and withdrawal.  Recommended to discontinue Ambien since he is taking more than prescribed dose.  Recommended to try Remeron which she had never tried before to help his anxiety and insomnia.  Discussed medication side effect specially weight gain from mirtazapine.  We will start mirtazapine 15 mg at bedtime, continue Lamictal 300 mg daily and lorazepam 0.5 mg twice a day.  Encouraged to keep appointment with [name] for therapy.  Discussed safety concerns at any time having active suicidal thoughts or homicidal thoughts and he need to call 911 or go to local emergency room.  Follow-up in 3 months.   Kathlee Nations, MD 08/19/2018, 10:59 AM

## 2018-08-24 ENCOUNTER — Ambulatory Visit (HOSPITAL_COMMUNITY): Payer: 59 | Admitting: Licensed Clinical Social Worker

## 2018-08-25 ENCOUNTER — Telehealth: Payer: Self-pay

## 2018-08-25 MED ORDER — METOPROLOL SUCCINATE ER 50 MG PO TB24: 50.0000 mg | ORAL_TABLET | Freq: Every day | ORAL | 3 refills | Status: DC

## 2018-08-25 NOTE — Telephone Encounter (Signed)
Pt walked in the office requesting to speak with a nurse. Pt sts that he has been to his dentist several times for swollen gums. Pt sts that it is not related to infection or poor dental hygiene. His was told that it may be one of his medications. He brought a list of his medications with him to his most recent dentist visit. He was told that Amlodipine (calcium channel blocker) can cause gum swelling, he was also told that in combination with his testerone replacement could aggravate his symptoms. He would like an alternative anti-hypertensive. He cannot take take diuretics due to his BPH and increased nocturia.  Adv pt that I will fwd an update to Dr.Crenshaw, we will call back with his response. pt verbalized understanding.

## 2018-08-25 NOTE — Telephone Encounter (Signed)
DC amlodipine and treat with toprol 50 mg daily [name]

## 2018-08-25 NOTE — Telephone Encounter (Signed)
Spoke with pt, Aware of dr crenshaw's recommendations. New script sent to the pharmacy  

## 2018-08-31 ENCOUNTER — Telehealth (HOSPITAL_COMMUNITY): Payer: Self-pay

## 2018-08-31 ENCOUNTER — Other Ambulatory Visit (HOSPITAL_COMMUNITY): Payer: Self-pay | Admitting: Psychiatry

## 2018-08-31 DIAGNOSIS — F3162 Bipolar disorder, current episode mixed, moderate: Secondary | ICD-10-CM

## 2018-08-31 NOTE — Telephone Encounter (Signed)
Patient states that the Remeron is not working and patient would like to go back on the Ambien.

## 2018-08-31 NOTE — Telephone Encounter (Signed)
Discontinue Remeron and restart Ambien 5 mg at bedtime.

## 2018-09-01 MED ORDER — ZOLPIDEM TARTRATE 5 MG PO TABS: 5.0000 mg | ORAL_TABLET | Freq: Every evening | ORAL | 1 refills | Status: DC | PRN

## 2018-09-01 NOTE — Telephone Encounter (Signed)
Called pharmacy and discontinued the Remeron and called in the Ambien. I called patient and left a voicemail letting him know

## 2018-09-02 DIAGNOSIS — H6983 Other specified disorders of Eustachian tube, bilateral: Secondary | ICD-10-CM | POA: Insufficient documentation

## 2018-09-07 ENCOUNTER — Ambulatory Visit (HOSPITAL_COMMUNITY): Payer: 59 | Admitting: Licensed Clinical Social Worker

## 2018-09-29 ENCOUNTER — Other Ambulatory Visit: Payer: Self-pay

## 2018-09-29 ENCOUNTER — Other Ambulatory Visit (INDEPENDENT_AMBULATORY_CARE_PROVIDER_SITE_OTHER): Payer: 59

## 2018-09-29 ENCOUNTER — Encounter: Payer: Self-pay | Admitting: Internal Medicine

## 2018-09-29 ENCOUNTER — Ambulatory Visit: Payer: 59 | Admitting: Internal Medicine

## 2018-09-29 ENCOUNTER — Ambulatory Visit (INDEPENDENT_AMBULATORY_CARE_PROVIDER_SITE_OTHER)
Admission: RE | Admit: 2018-09-29 | Discharge: 2018-09-29 | Disposition: A | Discharge status: Home / Self Care | Payer: 59 | Source: Ambulatory Visit | Attending: Internal Medicine | Admitting: Internal Medicine

## 2018-09-29 VITALS — BP 142/96 | HR 87 | Temp 98.0°F | Resp 16 | Ht 72.0 in | Wt 182.8 lb

## 2018-09-29 DIAGNOSIS — I10 Essential (primary) hypertension: Secondary | ICD-10-CM

## 2018-09-29 DIAGNOSIS — E890 Postprocedural hypothyroidism: Secondary | ICD-10-CM

## 2018-09-29 DIAGNOSIS — D508 Other iron deficiency anemias: Secondary | ICD-10-CM | POA: Diagnosis not present

## 2018-09-29 DIAGNOSIS — R7303 Prediabetes: Secondary | ICD-10-CM | POA: Diagnosis not present

## 2018-09-29 DIAGNOSIS — R5382 Chronic fatigue, unspecified: Secondary | ICD-10-CM

## 2018-09-29 DIAGNOSIS — R05 Cough: Secondary | ICD-10-CM

## 2018-09-29 DIAGNOSIS — E039 Hypothyroidism, unspecified: Secondary | ICD-10-CM | POA: Insufficient documentation

## 2018-09-29 DIAGNOSIS — J988 Other specified respiratory disorders: Secondary | ICD-10-CM | POA: Insufficient documentation

## 2018-09-29 DIAGNOSIS — R059 Cough, unspecified: Secondary | ICD-10-CM

## 2018-09-29 DIAGNOSIS — J4541 Moderate persistent asthma with (acute) exacerbation: Secondary | ICD-10-CM | POA: Diagnosis not present

## 2018-09-29 LAB — BASIC METABOLIC PANEL
BUN: 15 mg/dL (ref 6–23)
CO2: 27 mEq/L (ref 19–32)
Calcium: 8.8 mg/dL (ref 8.4–10.5)
Chloride: 101 mEq/L (ref 96–112)
Creatinine, Ser: 1.02 mg/dL (ref 0.40–1.50)
GFR: 73.34 mL/min (ref >60.00)
Glucose, Bld: 101 mg/dL — ABNORMAL HIGH (ref 70–99)
Potassium: 3.8 mEq/L (ref 3.5–5.1)
Sodium: 137 mEq/L (ref 135–145)

## 2018-09-29 LAB — POCT EXHALED NITRIC OXIDE: FeNO level (ppb): 93

## 2018-09-29 LAB — CBC WITH DIFFERENTIAL/PLATELET
Basophils Absolute: 0.2 10*3/uL — ABNORMAL HIGH (ref 0.0–0.1)
Basophils Relative: 1.3 % (ref 0.0–3.0)
Eosinophils Absolute: 0.8 10*3/uL — ABNORMAL HIGH (ref 0.0–0.7)
Eosinophils Relative: 5.8 % — ABNORMAL HIGH (ref 0.0–5.0)
HCT: 50.1 % (ref 39.0–52.0)
Hemoglobin: 16.5 g/dL (ref 13.0–17.0)
Lymphocytes Relative: 12.8 % (ref 12.0–46.0)
Lymphs Abs: 1.7 10*3/uL (ref 0.7–4.0)
MCHC: 33 g/dL (ref 30.0–36.0)
MCV: 83 fl (ref 78.0–100.0)
Monocytes Absolute: 1.4 10*3/uL — ABNORMAL HIGH (ref 0.1–1.0)
Monocytes Relative: 10.8 % (ref 3.0–12.0)
Neutro Abs: 9.1 10*3/uL — ABNORMAL HIGH (ref 1.4–7.7)
Neutrophils Relative %: 69.3 % (ref 43.0–77.0)
Platelets: 266 10*3/uL (ref 150.0–400.0)
RBC: 6.03 Mil/uL — ABNORMAL HIGH (ref 4.22–5.81)
RDW: 16.8 % — ABNORMAL HIGH (ref 11.5–15.5)
WBC: 13.2 10*3/uL — ABNORMAL HIGH (ref 4.0–10.5)

## 2018-09-29 LAB — IBC PANEL
Iron: 70 ug/dL (ref 42–165)
Saturation Ratios: 16.4 % — ABNORMAL LOW (ref 20.0–50.0)
Transferrin: 304 mg/dL (ref 212.0–360.0)

## 2018-09-29 LAB — RESPIRATORY VIRUS PANEL
Influenza A RNA: NOT DETECTED
Influenza B RNA: NOT DETECTED
RSV RNA: NOT DETECTED
hMPV: NOT DETECTED

## 2018-09-29 LAB — FERRITIN: Ferritin: 71 ng/mL (ref 22.0–322.0)

## 2018-09-29 LAB — TSH: TSH: 3.95 u[IU]/mL (ref 0.35–4.50)

## 2018-09-29 LAB — HEMOGLOBIN A1C: Hgb A1c MFr Bld: 5.5 % (ref 4.6–6.5)

## 2018-09-29 MED ORDER — HYDROCODONE-HOMATROPINE 5-1.5 MG/5ML PO SYRP: 5.0000 mL | ORAL_SOLUTION | Freq: Three times a day (TID) | ORAL | 0 refills | Status: DC | PRN

## 2018-09-29 MED ORDER — METHYLPREDNISOLONE ACETATE 80 MG/ML IJ SUSP
120.0000 mg | Freq: Once | INTRAMUSCULAR | Status: AC
  Administered 2018-09-29: 120 mg via INTRAMUSCULAR

## 2018-09-29 MED ORDER — HYDROCODONE-HOMATROPINE 5-1.5 MG/5ML PO SYRP: 5.0000 mL | ORAL_SOLUTION | Freq: Three times a day (TID) | ORAL | 0 refills | Status: AC | PRN

## 2018-09-29 MED ORDER — AMOXICILLIN-POT CLAVULANATE 875-125 MG PO TABS: 1.0000 | ORAL_TABLET | Freq: Two times a day (BID) | ORAL | 0 refills | Status: AC

## 2018-09-29 MED ORDER — BUDESONIDE-FORMOTEROL FUMARATE 80-4.5 MCG/ACT IN AERO: 2.0000 | INHALATION_SPRAY | Freq: Two times a day (BID) | RESPIRATORY_TRACT | 1 refills | Status: DC

## 2018-09-29 NOTE — Progress Notes (Signed)
Subjective:  Patient ID: Alex [name], male    DOB: 1955/12/21  Age: 63 y.o. MRN: [id number]  CC: Cough and Hypertension   HPI Christipher Rieger Aragones presents for a 2-day history of cough productive of yellow phlegm with fatigue and shortness of breath.  He has gotten some symptom relief with Sudafed.  He has a history of asthma and immunodeficiency.  Outpatient Medications Prior to Visit  Medication Sig Dispense Refill  . acetaminophen (TYLENOL) 500 MG tablet Take 1,000 mg by mouth 2 (two) times daily as needed for headache.    [name] aspirin EC 81 MG tablet Take 1 tablet by mouth daily.    Hinda Kehr 30 MG/ACT SOLN Place 2 Act onto the skin daily.    . Coenzyme Q10 (CO Q 10 PO) Take 1 capsule by mouth daily.     . fluticasone (FLONASE) 50 MCG/ACT nasal spray Place 2 sprays into both nostrils daily. 16 g 6  . Immune Globulin, Human, (HIZENTRA) 4 GM/20ML SOLN Inject into the skin once a week. wednesday    . LamoTRIgine 300 MG TB24 24 hour tablet Take 1 tablet (300 mg total) by mouth every morning. 90 tablet 0  . latanoprost (XALATAN) 0.005 % ophthalmic solution 1 DROP EACH EYE AT NIGHT    . levothyroxine (SYNTHROID, LEVOTHROID) 150 MCG tablet Take 1 tablet (150 mcg total) by mouth daily before breakfast. 90 tablet 0  . lidocaine-prilocaine (EMLA) cream Apply 1 application topically as needed.     [name] LORazepam (ATIVAN) 0.5 MG tablet Take 1 tab daily as needed and 1 tab at bed time 60 tablet 2  . LUTEIN PO Take 1 tablet by mouth daily.     [name] LYCOPENE PO Take by mouth.    . meloxicam (MOBIC) 15 MG tablet Take 1 tablet (15 mg total) by mouth daily as needed. 30 tablet 2  . metoprolol succinate (TOPROL-XL) 50 MG 24 hr tablet Take 1 tablet (50 mg total) by mouth daily. 90 tablet 3  . Multiple Vitamin (MULTI-VITAMINS) TABS Take by mouth.    . OMEGA-3 KRILL OIL PO Take 1 capsule by mouth daily.     [name] omeprazole (PRILOSEC) 20 MG capsule Take 20 mg by mouth every evening.    . Saw Palmetto, Serenoa  repens, (SAW PALMETTO PO) Take by mouth.    . sildenafil (VIAGRA) 25 MG tablet Take 5 mg by mouth daily as needed (BPH).     [name] Spacer/Aero-Holding Chambers (AEROCHAMBER MV) inhaler Use as instructed 1 each 0  . sucralfate (CARAFATE) 1 G tablet Take 1 g by mouth daily as needed (ACID REFLUX).   3  . tadalafil (ADCIRCA/CIALIS) 20 MG tablet Take 20 mg by mouth daily as needed for erectile dysfunction.    [name] telmisartan (MICARDIS) 80 MG tablet TAKE 1 TABLET (80 MG TOTAL) BY MOUTH DAILY. (Patient taking differently: Take 80 mg by mouth every morning. ) 90 tablet 3  . zolpidem (AMBIEN) 5 MG tablet Take 1 tablet (5 mg total) by mouth at bedtime as needed for sleep. 30 tablet 1  . budesonide-formoterol (SYMBICORT) 80-4.5 MCG/ACT inhaler Inhale 2 puffs into the lungs 2 (two) times daily. (Patient taking differently: Inhale 2 puffs into the lungs 2 (two) times daily as needed (Bronchitis). ) 1 Inhaler 0  . pseudoephedrine (SUDAFED) 30 MG tablet Take 60 mg by mouth daily as needed for congestion.    [name] amoxicillin-clavulanate (AUGMENTIN) 875-125 MG tablet Take 1 tablet by mouth 2 (two) times daily. [place]  tablet 0   No facility-administered medications prior to visit.     ROS Review of Systems  Constitutional: Positive for fatigue. Negative for chills, diaphoresis and fever.  HENT: Positive for sore throat.   Eyes: Positive for redness and itching. Negative for photophobia, pain, discharge and visual disturbance.  Respiratory: Positive for cough and shortness of breath. Negative for wheezing.   Cardiovascular: Negative for chest pain, palpitations and leg swelling.  Gastrointestinal: Negative for abdominal pain.  Endocrine: Negative.   Genitourinary: Negative.  Negative for difficulty urinating, dysuria and hematuria.  Musculoskeletal: Negative.  Negative for arthralgias and myalgias.  Skin: Negative.  Negative for color change and rash.  Neurological: Negative for dizziness, weakness, light-headedness,  numbness and headaches.  Hematological: Negative for adenopathy. Does not bruise/bleed easily.  Psychiatric/Behavioral: Negative.     Objective:  BP (!) 142/96 (BP Location: Left Arm, Patient Position: Sitting, Cuff Size: Normal)   Pulse 87   Temp 98 F (36.7 C) (Oral)   Resp 16   Ht 6' (1.829 m)   Wt 182 lb 12 oz (82.9 kg)   SpO2 97%   BMI 24.79 kg/m   BP Readings from Last 3 Encounters:  09/29/18 (!) 142/96  08/04/18 124/83  06/29/18 136/82    Wt Readings from Last 3 Encounters:  09/29/18 182 lb 12 oz (82.9 kg)  08/04/18 181 lb 4.8 oz (82.2 kg)  06/29/18 181 lb 3.2 oz (82.2 kg)    Physical Exam Vitals signs reviewed.  Constitutional:      Appearance: He is not ill-appearing or diaphoretic.  HENT:     Mouth/Throat:     Pharynx: Posterior oropharyngeal erythema present. No pharyngeal swelling, oropharyngeal exudate or uvula swelling.     Tonsils: No tonsillar exudate or tonsillar abscesses.  Eyes:     General:        Right eye: No discharge.        Left eye: No discharge.     Conjunctiva/sclera:     Right eye: Right conjunctiva is injected. No chemosis, exudate or hemorrhage.    Left eye: Left conjunctiva is injected. No chemosis, exudate or hemorrhage. Cardiovascular:     Rate and Rhythm: Normal rate and regular rhythm.     Heart sounds: No murmur. No gallop.   Pulmonary:     Effort: Pulmonary effort is normal.     Breath sounds: Normal breath sounds. No stridor. No decreased breath sounds, wheezing, rhonchi or rales.  Abdominal:     General: There is no distension.     Palpations: There is no hepatomegaly, splenomegaly or mass.     Tenderness: There is no abdominal tenderness. There is no guarding.  Musculoskeletal: Normal range of motion.        General: No swelling.     Right lower leg: No edema.     Left lower leg: No edema.  Lymphadenopathy:     Head:     Right side of head: No occipital adenopathy.     Left side of head: No occipital adenopathy.      Cervical: No cervical adenopathy.     Upper Body:     Right upper body: No supraclavicular adenopathy.     Left upper body: No supraclavicular adenopathy.  Skin:    General: Skin is dry.     Coloration: Skin is not pale.     Findings: No erythema or rash.  Neurological:     General: No focal deficit present.     Mental Status:  He is oriented to person, place, and time. Mental status is at baseline.     Lab Results  Component Value Date   WBC 13.2 (H) 09/29/2018   HGB 16.5 09/29/2018   HCT 50.1 09/29/2018   PLT 266.0 09/29/2018   GLUCOSE 101 (H) 09/29/2018   CHOL 166 05/12/2018   TRIG 85 05/12/2018   HDL 74 05/12/2018   LDLDIRECT 59.0 02/02/2016   LDLCALC 75 05/12/2018   ALT 39 06/08/2018   AST 31 06/08/2018   NA 137 09/29/2018   K 3.8 09/29/2018   CL 101 09/29/2018   CREATININE 1.02 09/29/2018   BUN 15 09/29/2018   CO2 27 09/29/2018   TSH 3.95 09/29/2018   PSA 1.5 07/23/2016   INR 0.87 06/08/2018   HGBA1C 5.5 09/29/2018    Ct Soft Tissue Neck W Contrast  Result Date: 07/20/2018 CLINICAL DATA:  History of thyroid cancer with thyroidectomy in 2002. Balance disturbance and hearing loss. History of squamous cell cancer the orbit EXAM: CT NECK WITH CONTRAST TECHNIQUE: Multidetector CT imaging of the neck was performed using the standard protocol following the bolus administration of intravenous contrast. CONTRAST:  34mL OMNIPAQUE IOHEXOL 300 MG/ML  SOLN COMPARISON:  MRI 07/20/2018 FINDINGS: Pharynx and larynx: No mucosal or submucosal lesion. Salivary glands: Left submandibular gland is normal. Right submandibular gland is atrophic or resected. Parotid glands are normal. Thyroid: Previous thyroidectomy. No evidence of residual thyroid tissue. Lymph nodes: No enlarged nodes seen on either side of the neck. Vascular: Carotid artery atherosclerosis.  Jugular veins are patent. Limited intracranial: Normal Visualized orbits: Normal Mastoids and visualized paranasal sinuses: Cyst or  polyp in the right division of the sphenoid sinus. Chronic mucoperiosteal thickening of the sphenoid sinuses. No fluid in the middle ears or mastoids. Skeleton: Mid cervical spondylosis. Upper chest: Normal Other: None IMPRESSION: Previous thyroidectomy. No evidence residual or recurrent thyroid disease. Absent or atrophic right submandibular gland. Electronically Signed   By: Nelson Chimes [name]   On: 07/20/2018 15:06   Mr Jeri Cos HF Contrast  Result Date: 07/20/2018 CLINICAL DATA:  Non intractable headache. History of squamous cell cancer of orbit. Vision change in corneal hemorrhage. Hearing loss mixed conductive and sensorineural. History of thyroid cancer. EXAM: MRI HEAD WITHOUT AND WITH CONTRAST TECHNIQUE: Multiplanar, multiecho pulse sequences of the brain and surrounding structures were obtained without and with intravenous contrast. CONTRAST:  8 mL Gadovist IV COMPARISON:  MRI orbit 10/28/2014.  MRI head 01/05/2012 FINDINGS: Brain: Ventricle size is normal. Cerebral volume normal for age. Scattered small white matter hyperintensities appear chronic and have progressed since 2013. No acute infarct. Negative for hemorrhage or mass. Normal enhancement.  No enhancing mass lesion identified. 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 Normal arterial flow voids Skull and upper cervical spine: Negative Sinuses/Orbits: Mild mucosal edema in the paranasal sinuses. Left orbit normal. Right cataract surgery. No orbital mass lesion. Detailed fat-suppressed images the orbit were not obtained today. Other: None IMPRESSION: No acute abnormality. No recurrent mass lesion in the orbit. Mild chronic microvascular ischemic changes have progressed since 2013 Electronically Signed   By: Franchot Gallo [name]   On: 07/20/2018 14:27   Dg Chest 2 View  Result Date: 09/29/2018 CLINICAL DATA:  Cough EXAM: CHEST - 2 VIEW COMPARISON:  11/10/2015 FINDINGS: Cardiac and mediastinal  contours normal. Vascularity normal. Lungs are clear without infiltrate or effusion. Chronic left upper rib fractures unchanged from the prior study. Surgical clips in the  thyroid bed bilaterally. IMPRESSION: No active cardiopulmonary disease. Electronically Signed   By: Franchot Gallo [name]   On: 09/29/2018 15:44     Assessment & Plan:   Sherri was seen today for cough and hypertension.  Diagnoses and all orders for this visit:  Cough- His viral respiratory panel is negative.  Chest x-ray is negative for mass or infiltrate.  His FeNO score is elevated so will treat him for allergic and eosinophilic conditions. -     Cancel: Respiratory virus panel -     DG Chest 2 View; Future -     POCT EXHALED NITRIC OXIDE -     Respiratory virus panel; Future  Other iron deficiency anemia- His H&H and iron levels are normal now. -     CBC with Differential/Platelet; Future -     IBC panel; Future -     Ferritin; Future  Hypertension, uncontrolled- His blood pressure is not adequately well controlled.  I have asked him to stop taking Sudafed. -     Basic metabolic panel; Future  Postoperative hypothyroidism- His TSH is in the normal range.  He will remain on the current dose of levothyroxine. -     TSH; Future  Prediabetes- His blood sugars are normal now. -     Hemoglobin A1c; Future  Chronic fatigue- His labs are negative for secondary causes. -     Testosterone Total,Free,Bio, Males; Future  Moderate persistent asthma with acute exacerbation -     budesonide-formoterol (SYMBICORT) 80-4.5 MCG/ACT inhaler; Inhale 2 puffs into the lungs 2 (two) times daily. -     methylPREDNISolone acetate (DEPO-MEDROL) injection 120 mg  RTI (respiratory tract infection) -     Discontinue: HYDROcodone-homatropine (HYCODAN) 5-1.5 MG/5ML syrup; Take 5 mLs by mouth every 8 (eight) hours as needed for up to 7 days for cough. -     amoxicillin-clavulanate (AUGMENTIN) 875-125 MG tablet; Take 1 tablet by mouth 2  (two) times daily for 10 days. -     HYDROcodone-homatropine (HYCODAN) 5-1.5 MG/5ML syrup; Take 5 mLs by mouth every 8 (eight) hours as needed for up to 7 days for cough.   I have discontinued Trinna Balloon. Wien "[name]"'s pseudoephedrine and amoxicillin-clavulanate. I am also having him start on amoxicillin-clavulanate. Additionally, I am having him maintain his Immune Globulin (Human), Coenzyme Q10 (CO Q 10 PO), OMEGA-3 KRILL OIL PO, sucralfate, AXIRON, lidocaine-prilocaine, aspirin EC, LUTEIN PO, AEROCHAMBER MV, latanoprost, telmisartan, sildenafil, meloxicam, (Saw Palmetto, Serenoa repens, (SAW PALMETTO PO)), LYCOPENE PO, omeprazole, acetaminophen, MULTI-VITAMINS, tadalafil, fluticasone, levothyroxine, LamoTRIgine, LORazepam, metoprolol succinate, zolpidem, budesonide-formoterol, and HYDROcodone-homatropine. We administered methylPREDNISolone acetate.  Meds ordered this encounter  Medications  . budesonide-formoterol (SYMBICORT) 80-4.5 MCG/ACT inhaler    Sig: Inhale 2 puffs into the lungs 2 (two) times daily.    Dispense:  3 Inhaler    Refill:  1    Order Specific Question:   Lot Number?    Answer:   [id number]    Order Specific Question:   Expiration Date?    Answer:   06/12/2017    Order Specific Question:   Manufacturer?    Answer:   Astra[employer]eneca [[id number]]    Order Specific Question:   Quantity    Answer:   1  . methylPREDNISolone acetate (DEPO-MEDROL) injection 120 mg  . DISCONTD: HYDROcodone-homatropine (HYCODAN) 5-1.5 MG/5ML syrup    Sig: Take 5 mLs by mouth every 8 (eight) hours as needed for up to 7 days for cough.    Dispense:  120 mL  Refill:  0  . amoxicillin-clavulanate (AUGMENTIN) 875-125 MG tablet    Sig: Take 1 tablet by mouth 2 (two) times daily for 10 days.    Dispense:  20 tablet    Refill:  0  . HYDROcodone-homatropine (HYCODAN) 5-1.5 MG/5ML syrup    Sig: Take 5 mLs by mouth every 8 (eight) hours as needed for up to 7 days for cough.    Dispense:  120 mL    Refill:  0      Follow-up: No follow-ups on file.  Scarlette Calico, MD

## 2018-09-30 ENCOUNTER — Encounter: Payer: Self-pay | Admitting: Internal Medicine

## 2018-09-30 ENCOUNTER — Telehealth: Payer: Self-pay | Admitting: Internal Medicine

## 2018-09-30 LAB — TESTOSTERONE TOTAL,FREE,BIO, MALES
Albumin: 4.4 g/dL (ref 3.6–5.1)
Sex Hormone Binding: 21 nmol/L — ABNORMAL LOW (ref 22–77)
Testosterone, Bioavailable: 211.5 ng/dL (ref >=110.0)
Testosterone, Free: 105.1 pg/mL (ref 46.0–224.0)
Testosterone: 535 ng/dL (ref 250–827)

## 2018-09-30 NOTE — Patient Instructions (Signed)
Cough, Adult  Coughing is a reflex that clears your throat and your airways. Coughing helps to heal and protect your lungs. It is normal to cough occasionally, but a cough that happens with other symptoms or lasts a long time may be a sign of a condition that needs treatment. A cough may last only 2-3 weeks (acute), or it may last longer than 8 weeks (chronic). What are the causes? Coughing is commonly caused by:  Breathing in substances that irritate your lungs.  A viral or bacterial respiratory infection.  Allergies.  Asthma.  Postnasal drip.  Smoking.  Acid backing up from the stomach into the esophagus (gastroesophageal reflux).  Certain medicines.  Chronic lung problems, including COPD (or rarely, lung cancer).  Other medical conditions such as heart failure. Follow these instructions at home: Pay attention to any changes in your symptoms. Take these actions to help with your discomfort:  Take medicines only as told by your health care provider. ? If you were prescribed an antibiotic medicine, take it as told by your health care provider. Do not stop taking the antibiotic even if you start to feel better. ? Talk with your health care provider before you take a cough suppressant medicine.  Drink enough fluid to keep your urine clear or pale yellow.  If the air is dry, use a cold steam vaporizer or humidifier in your bedroom or your home to help loosen secretions.  Avoid anything that causes you to cough at work or at home.  If your cough is worse at night, try sleeping in a semi-upright position.  Avoid cigarette smoke. If you smoke, quit smoking. If you need help quitting, ask your health care provider.  Avoid caffeine.  Avoid alcohol.  Rest as needed. Contact a health care provider if:  You have new symptoms.  You cough up pus.  Your cough does not get better after 2-3 weeks, or your cough gets worse.  You cannot control your cough with suppressant  medicines and you are losing sleep.  You develop pain that is getting worse or pain that is not controlled with pain medicines.  You have a fever.  You have unexplained weight loss.  You have night sweats. Get help right away if:  You cough up blood.  You have difficulty breathing.  Your heartbeat is very fast. This information is not intended to replace advice given to you by your health care provider. Make sure you discuss any questions you have with your health care provider. Document Released: 01/25/2011 Document Revised: 01/04/2016 Document Reviewed: 10/05/2014 Elsevier Interactive Patient Education  2019 Elsevier Inc.  

## 2018-09-30 NOTE — Telephone Encounter (Signed)
mychart message sent to Dr. Alain Marion

## 2018-09-30 NOTE — Telephone Encounter (Signed)
Copied from [place] [phone]. Topic: Quick Communication - See Telephone Encounter >> Sep 30, 2018 12:14 PM Rutherford Nail, NT wrote: CRM for notification. See Telephone encounter for: 09/30/18.  Please see MyChart Message from 09/30/2018. Patient calling to check the status of his refills that he mentioned to the nurse at [place] visit with Dr Ronnald Ramp. Please advise.

## 2018-10-09 ENCOUNTER — Other Ambulatory Visit: Payer: Self-pay | Admitting: Internal Medicine

## 2018-10-09 MED ORDER — SILDENAFIL CITRATE 20 MG PO TABS: 20.0000 mg | ORAL_TABLET | Freq: Every day | ORAL | 11 refills | Status: DC | PRN

## 2018-10-14 ENCOUNTER — Encounter (HOSPITAL_COMMUNITY): Payer: Self-pay | Admitting: Licensed Clinical Social Worker

## 2018-10-14 ENCOUNTER — Telehealth (HOSPITAL_COMMUNITY): Payer: Self-pay

## 2018-10-14 ENCOUNTER — Ambulatory Visit (INDEPENDENT_AMBULATORY_CARE_PROVIDER_SITE_OTHER): Payer: 59 | Admitting: Licensed Clinical Social Worker

## 2018-10-14 DIAGNOSIS — F3162 Bipolar disorder, current episode mixed, moderate: Secondary | ICD-10-CM

## 2018-10-14 DIAGNOSIS — F411 Generalized anxiety disorder: Secondary | ICD-10-CM

## 2018-10-14 NOTE — Telephone Encounter (Signed)
Medication problem - Patient was in seeing therapist today and wanted to let Dr. Adele Schilder he would like to have Ambien back to 10 mg a night, 0.5 - 1 a night as he had taken previously.  Patient reported he usually takes a 1/2 of a 10 mg tablet and only takes the second 1/2 if he wakes up.  Patient reported the last dosage sent to pharmacy was just for 5 mg at bedtime so would like a new order with up to 1 of a 10 mg written and sent to his pharmacy.  Agreed to send request to Dr. Adele Schilder.

## 2018-10-14 NOTE — Progress Notes (Signed)
Therapist Progress Note   Time: 1:10-2pm  Participation Level: Active  Behavioral Response: Casual/Alert/ Depressed  Type of Therapy: Individual Therapy  Treatment Goals Addressed: Improve Psychiatric Symptoms, elevate mood (increased self-esteem, increased self-compassion, increased interaction), improve unhelpful thought patterns, controlled behavior, moderate mood, deliberate speech and thought process(improved social functioning, healthy adjustment to living situation), Learn about diagnosis, healthy coping skills.  Interventions: Supportive: CBT  Summary: Pt presented for individual therapy. Pt discussed his psychiatric symptoms and current life events.  He presents depressed today. Pt has not been to therapy in 2 months and had a stressful 2 months and is glad to be back in therapy. Pt visited with his demented father who lives in [place]. Upon his return home his father passed away. Pt was tearful in his discussion about losing his father. Discussed stages of grief with pt and may consider a referral to Hospice. Pt continues to experience his external stressors: family, $, future. Educated pt on how his external stressors can add up to create a massive stress level, which can affect his health. Pt does not have control over all his external stressors but he does have control over his environment and the way he reacts to external events. Educated pt on radical acceptance.   Suicidal/Homicidal: No/ without plan  Therapist Response: Assessed pt's current functioning and reviewed progress. Assisted pt processing family issues, father passing, health issues, external stressors. Assisted pt  processing for management of stressors.  Plan: Return in 2 weeks using CBT  Diagnosis: Axis 1: Bipolar 1 disorder,   [name], LCAS 10/14/2018

## 2018-10-15 MED ORDER — ZOLPIDEM TARTRATE 10 MG PO TABS: 10.0000 mg | ORAL_TABLET | Freq: Every evening | ORAL | 0 refills | Status: DC | PRN

## 2018-10-15 NOTE — Telephone Encounter (Signed)
Ambien 10 mg called in.  He should not be taking Remeron.

## 2018-10-23 ENCOUNTER — Other Ambulatory Visit: Payer: Self-pay | Admitting: Internal Medicine

## 2018-10-28 ENCOUNTER — Ambulatory Visit (HOSPITAL_COMMUNITY): Payer: 59 | Admitting: Psychiatry

## 2018-10-31 ENCOUNTER — Other Ambulatory Visit: Payer: Self-pay | Admitting: Internal Medicine

## 2018-11-01 ENCOUNTER — Telehealth: Payer: Self-pay | Admitting: Pulmonary Disease

## 2018-11-01 MED ORDER — PREDNISONE 10 MG PO TABS: 10.0000 mg | ORAL_TABLET | Freq: Two times a day (BID) | ORAL | 0 refills | Status: AC

## 2018-11-01 MED ORDER — BENZONATATE 100 MG PO CAPS: 100.0000 mg | ORAL_CAPSULE | Freq: Four times a day (QID) | ORAL | 0 refills | Status: DC | PRN

## 2018-11-01 MED ORDER — LEVOFLOXACIN 750 MG PO TABS: 750.0000 mg | ORAL_TABLET | Freq: Every day | ORAL | 0 refills | Status: DC

## 2018-11-01 NOTE — Telephone Encounter (Signed)
Patient called into answering service for cough, shortness of breath, increased sputum production, nasal stuffiness and congestion  Has been using leftover Augmentin  Called in Levaquin, steroids, Tessalon Perles  Encouraged to call the office if not feeling better

## 2018-11-04 ENCOUNTER — Other Ambulatory Visit (HOSPITAL_COMMUNITY): Payer: Self-pay

## 2018-11-04 DIAGNOSIS — F3162 Bipolar disorder, current episode mixed, moderate: Secondary | ICD-10-CM

## 2018-11-04 MED ORDER — LAMOTRIGINE ER 300 MG PO TB24: 1.0000 | ORAL_TABLET | ORAL | 0 refills | Status: DC

## 2018-11-05 ENCOUNTER — Other Ambulatory Visit: Payer: Self-pay

## 2018-11-05 NOTE — Telephone Encounter (Signed)
LMTCB to schedule virtual visit

## 2018-11-05 NOTE — Telephone Encounter (Signed)
Reason for CRM: Pt stated he does not want to schedule a virtual visit at this. Pt stated he will call back if he needs to schedule an appt.

## 2018-11-08 ENCOUNTER — Other Ambulatory Visit (HOSPITAL_COMMUNITY): Payer: Self-pay | Admitting: Psychiatry

## 2018-11-08 DIAGNOSIS — F411 Generalized anxiety disorder: Secondary | ICD-10-CM

## 2018-11-08 MED ORDER — TADALAFIL 20 MG PO TABS: 5.0000 mg | ORAL_TABLET | Freq: Every day | ORAL | 0 refills | Status: DC | PRN

## 2018-11-09 ENCOUNTER — Ambulatory Visit (INDEPENDENT_AMBULATORY_CARE_PROVIDER_SITE_OTHER): Payer: 59 | Admitting: Licensed Clinical Social Worker

## 2018-11-09 ENCOUNTER — Other Ambulatory Visit: Payer: Self-pay

## 2018-11-09 ENCOUNTER — Ambulatory Visit (HOSPITAL_COMMUNITY): Payer: 59 | Admitting: Psychiatry

## 2018-11-09 DIAGNOSIS — F3162 Bipolar disorder, current episode mixed, moderate: Secondary | ICD-10-CM | POA: Diagnosis not present

## 2018-11-10 ENCOUNTER — Other Ambulatory Visit: Payer: Self-pay | Admitting: Cardiology

## 2018-11-15 ENCOUNTER — Encounter (HOSPITAL_COMMUNITY): Payer: Self-pay | Admitting: Licensed Clinical Social Worker

## 2018-11-15 NOTE — Progress Notes (Addendum)
Virtual Visit via Telephone Note  I connected with Alex [name] on 11/09/2018 at  3:00 PM EDT by phone as pt is uncomfortable at this time to use video enabled telemedicine application and verified that I am speaking with the correct person using two identifiers.   I discussed the limitations of evaluation and management by telemedicine and the availability of in person appointments. The patient expressed understanding and agreed to proceed.  History of Present Illness:  Pt was referred by Dr. Adele Schilder for OP behavior health therapy for bipolar disorder.    Observations/Objective: Pt reports he is anxious today due to the pandemic. He is a considered high risk due to his list of health issues. Objective" Enhance ability to handle effectively life's anxieties:  Assessment and Plan: Reviewed tx plan with pt.  Pt discussed his lack of ability to remain focused on his physical and mental health  due to having 3 of his children home. Pt reports his anxiety has increased due to the pandemic, providing safety for his 3 children at home, children that don't live in his house, having a wife who goes to work daily at [employer] Assisted pt using socratic questions about his fears. Assisted pt by proving coping skills for his current level of anxiety.   Follow Up Instructions:    I discussed the assessment and treatment plan with the patient. The patient was provided an opportunity to ask questions and all were answered. The patient agreed with the plan and demonstrated an understanding of the instructions.   The patient was advised to call back or seek an in-person evaluation if the symptoms worsen or if the condition fails to improve as anticipated.  I provided 50 minutes of non-face-to-face time during this encounter.   Jacek Colson S, LCAS

## 2018-11-16 ENCOUNTER — Telehealth (HOSPITAL_COMMUNITY): Payer: Self-pay

## 2018-11-16 NOTE — Telephone Encounter (Signed)
Should be taking half tablet as needed.  Too soon to get refill.

## 2018-11-16 NOTE — Telephone Encounter (Signed)
Medication refill request - Patient called with request for a refill of his Ambien last provided 1 time on 10/15/18. Patient is almost out and does not return until 11/23/18.  Requests this be sent to the [facility] on [address].

## 2018-11-16 NOTE — Telephone Encounter (Signed)
He supposed to take Ambien 5 mg as needed.  We will discuss on his next appointment.

## 2018-11-17 NOTE — Telephone Encounter (Signed)
Medication management - Message left for patient Dr. Adele Schilder was not willing to refill Ambien at this time as he should only be taking a 1/2 of a 10 mg tablet.  Requested patient call back if questions or concerns.

## 2018-11-23 ENCOUNTER — Ambulatory Visit (INDEPENDENT_AMBULATORY_CARE_PROVIDER_SITE_OTHER): Payer: 59 | Admitting: Psychiatry

## 2018-11-23 ENCOUNTER — Other Ambulatory Visit: Payer: Self-pay

## 2018-11-23 DIAGNOSIS — F3162 Bipolar disorder, current episode mixed, moderate: Secondary | ICD-10-CM

## 2018-11-23 DIAGNOSIS — F319 Bipolar disorder, unspecified: Secondary | ICD-10-CM

## 2018-11-23 DIAGNOSIS — F411 Generalized anxiety disorder: Secondary | ICD-10-CM | POA: Diagnosis not present

## 2018-11-23 MED ORDER — ZOLPIDEM TARTRATE 10 MG PO TABS: ORAL_TABLET | ORAL | 1 refills | Status: DC

## 2018-11-23 MED ORDER — LAMOTRIGINE ER 300 MG PO TB24: 1.0000 | ORAL_TABLET | ORAL | 0 refills | Status: DC

## 2018-11-23 MED ORDER — LORAZEPAM 0.5 MG PO TABS: ORAL_TABLET | ORAL | 1 refills | Status: DC

## 2018-11-23 NOTE — Progress Notes (Signed)
Virtual Visit via Telephone Note  I connected with Alex [name] on 11/23/18 at  2:20 PM EDT by telephone and verified that I am speaking with the correct person using two identifiers.   I discussed the limitations, risks, security and privacy concerns of performing an evaluation and management service by telephone and the availability of in person appointments. I also discussed with the patient that there may be a patient responsible charge related to this service. The patient expressed understanding and agreed to proceed.   History of Present Illness: Patient was evaluated through phone conversation.  He admitted increase anxiety and nervousness due to pandemic coronavirus.  His wife is working 50 hours a week at home due to because it is essential service.  He is sad because his father died in 10-31-2022.  His son Alex [name] is at home and is pleased that he is working from home after he recently discharged from [employer].  He admitted chronic financial and family issues.  We tried Remeron but it make him more depressed and he stopped taking it.  He is back on Ambien taking 5 mg and some nights he take 10 mg.  Is also taking lorazepam 1 5 mg up to twice a day.  Like to continue Lamictal since it is helping his mood irritability and depression.  He is seeing [name] for therapy.  He has no rash or any itching.  His energy level is fair.  Past Psychiatric History: Reviewed. H/O depression, mood swing, anger and Inpatient at [facility] due to suicidal thoughts but no attempt. No history of psychosis but h/o poor impulse control. In the past took Seroquel, Cymbalta and Remeron (increase depression)  Observations/Objective: Limited mental status examination done on the phone.  He described his mood anxious.  His speech is slow but clear and coherent.  His attention and concentration is fair.  He denies any auditory or visual hallucination.  He denies any active or  passive suicidal thoughts or any homicidal thought.  He denies any highs and lows, grandiosity or any paranoia.  He is alert and oriented x3.  His fund of knowledge is adequate.  His cognition is intact.  He reported no tremors, shakes, rash or any itching.  His insight judgment is okay.  Assessment and Plan: Bipolar disorder type I.  Generalized anxiety disorder.  We will discontinue Remeron as it makes him more depressed.  He like to go back on Ambien which he started already.  He is taking Ambien 5 mg most of the nights and sometimes take extra half if he cannot sleep.  I will continue Ambien 10 mg half tablet every night and may take extra half if needed, continue lorazepam 0.5 mg twice a day as needed for anxiety and Lamictal 300 mg daily.  Encouraged to keep appointment with [name] for therapy.  Recommended to call us back if is any question or any concern.  Follow-up in 3 months.  Follow Up Instructions:    I discussed the assessment and treatment plan with the patient. The patient was provided an opportunity to ask questions and all were answered. The patient agreed with the plan and demonstrated an understanding of the instructions.   The patient was advised to call back or seek an in-person evaluation if the symptoms worsen or if the condition fails to improve as anticipated.  I provided 20 minutes of non-face-to-face time during this encounter.   Kathlee Nations, MD

## 2018-11-24 ENCOUNTER — Other Ambulatory Visit: Payer: Self-pay

## 2018-11-24 ENCOUNTER — Ambulatory Visit (INDEPENDENT_AMBULATORY_CARE_PROVIDER_SITE_OTHER): Payer: 59 | Admitting: Licensed Clinical Social Worker

## 2018-11-24 ENCOUNTER — Ambulatory Visit (HOSPITAL_COMMUNITY): Payer: 59 | Admitting: Psychiatry

## 2018-11-24 ENCOUNTER — Encounter (HOSPITAL_COMMUNITY): Payer: Self-pay | Admitting: Licensed Clinical Social Worker

## 2018-11-24 DIAGNOSIS — F3162 Bipolar disorder, current episode mixed, moderate: Secondary | ICD-10-CM

## 2018-11-24 NOTE — Progress Notes (Signed)
Virtual Visit via Telephone Note  I connected with Alex [name] on 11/24/2018 at  3:00 PM EDT by phone as pt is uncomfortable at this time to use video enabled telemedicine application and verified that I am speaking with the correct person using two identifiers.   I discussed the limitations of evaluation and management by telemedicine and the availability of in person appointments. The patient expressed understanding and agreed to proceed.  History of Present Illness:  Pt was referred by Dr. Adele Schilder for OP behavior health therapy for bipolar disorder.    Observations/Objective: Pt reports he is anxious today due to the pandemic. He is a considered high risk due to his list of health issues. Objective" Enhance ability to handle effectively life's anxieties:  Assessment and Plan:  Pt presents anxious and depressed via phone for his individual therapy session. Pt reports he continues to be dealing with the death of his father. Suggested pt to contact Hospice for counseling, as they are probably conducting therapy via phone as well. Talked  With pt about the stages of grief. Pt is agitated about the estate and feels slighted.  Discussed options.  Pt is agitated because his wife is working at an essential job; he is high risk due to his health concerns. His children are still living at home and taking advantage. "If nothing changes...nothing changes." Pt continues to have anger, frustration, anxiety because of his children. Continue to discuss options with pt.       Follow Up Instructions:    I discussed the assessment and treatment plan with the patient. The patient was provided an opportunity to ask questions and all were answered. The patient agreed with the plan and demonstrated an understanding of the instructions.   The patient was advised to call back or seek an in-person evaluation if the symptoms worsen or if the condition fails to improve as anticipated.  I provided 50 minutes of  non-face-to-face time during this encounter.   MACKENZIE,LISBETH S, LCAS

## 2018-12-07 ENCOUNTER — Ambulatory Visit (HOSPITAL_COMMUNITY): Payer: 59 | Admitting: Psychiatry

## 2018-12-07 ENCOUNTER — Other Ambulatory Visit: Payer: Self-pay

## 2018-12-07 ENCOUNTER — Ambulatory Visit (HOSPITAL_COMMUNITY): Payer: 59 | Admitting: Licensed Clinical Social Worker

## 2018-12-07 ENCOUNTER — Encounter (HOSPITAL_COMMUNITY): Payer: Self-pay | Admitting: Licensed Clinical Social Worker

## 2018-12-07 ENCOUNTER — Ambulatory Visit (INDEPENDENT_AMBULATORY_CARE_PROVIDER_SITE_OTHER): Payer: 59 | Admitting: Licensed Clinical Social Worker

## 2018-12-07 DIAGNOSIS — F3162 Bipolar disorder, current episode mixed, moderate: Secondary | ICD-10-CM

## 2018-12-07 NOTE — Progress Notes (Signed)
Virtual Visit via Telephone Note  I connected with Alex [name] on 12/07/2018 at 11:00 AM EDT by phone as pt is uncomfortable at this time to use video enabled telemedicine application and verified that I am speaking with the correct person using two identifiers.   I discussed the limitations of evaluation and management by telemedicine and the availability of in person appointments. The patient expressed understanding and agreed to proceed.  History of Present Illness:  Pt was referred by Dr. Adele Schilder for OP therapy for bipolar disorder.    Observations/Objective: Pt presents anxious for his phone therapy session.Pt reports he is anxious today. Pt discussed his psychiatric symptoms and current life events. "I've been thinking about end of life - not suicide, but what I want the rest of my life to look like. I just turned 63." Discussed options. Asked open ended questions. Pt is in the process of getting his father's estate settled. There is some issues within the family about the estate. Pt discussed his children and the stress they bring to his life. This is a continuing issue. Continue to discuss boundaries with pt. And moving forward with his life.Pt reports he is having negative thoughts from his past. Educated pt on challenging negative thoughts using socratic questioning. Sent pt worksheet. Educated pt on the purpose and practice of mindfulness.        Follow Up Instructions:    I discussed the assessment and treatment plan with the patient. The patient was provided an opportunity to ask questions and all were answered. The patient agreed with the plan and demonstrated an understanding of the instructions.   The patient was advised to call back or seek an in-person evaluation if the symptoms worsen or if the condition fails to improve as anticipated.  I provided 50 minutes of non-face-to-face time during this encounter.   MACKENZIE,LISBETH S, LCAS

## 2018-12-15 ENCOUNTER — Other Ambulatory Visit: Payer: Self-pay | Admitting: Internal Medicine

## 2018-12-16 ENCOUNTER — Other Ambulatory Visit: Payer: Self-pay | Admitting: Internal Medicine

## 2018-12-21 ENCOUNTER — Ambulatory Visit (HOSPITAL_COMMUNITY): Payer: 59 | Admitting: Licensed Clinical Social Worker

## 2019-01-05 ENCOUNTER — Encounter (HOSPITAL_COMMUNITY): Payer: Self-pay | Admitting: Licensed Clinical Social Worker

## 2019-01-05 ENCOUNTER — Ambulatory Visit (INDEPENDENT_AMBULATORY_CARE_PROVIDER_SITE_OTHER): Payer: 59 | Admitting: Licensed Clinical Social Worker

## 2019-01-05 ENCOUNTER — Other Ambulatory Visit: Payer: Self-pay

## 2019-01-05 DIAGNOSIS — F3162 Bipolar disorder, current episode mixed, moderate: Secondary | ICD-10-CM | POA: Diagnosis not present

## 2019-01-05 NOTE — Progress Notes (Signed)
Comprehensive Clinical Assessment (CCA) Note  01/05/2019 Alex [name] [id number]  Visit Diagnosis:  Bipolar 1 disorder   CCA Part One  Part One has been completed on paper by the patient.  (See scanned document in Chart Review)  CCA Part Two A  Intake/Chief Complaint:  CCA Intake With Chief Complaint CCA Part Two Date: 01/05/19 CCA Part Two Time: 1551 Chief Complaint/Presenting Problem: Pt continues to have family drama which is his major stressor.  Patients Currently Reported Symptoms/Problems: Pt continues to have depressive symptoms, feelings of hopelessness Collateral Involvement: psychiatrist and therapy notes Individual's Strengths: family support Individual's Preferences: prefers to have less stress in life, prefers to not have 3 adult children living at home Type of Services Patient Feels Are Needed: outpatient  Mental Health Symptoms Depression:  Depression: Irritability, Tearfulness, Change in energy/activity, Hopelessness, Difficulty Concentrating  Mania:  Mania: Irritability, Racing thoughts  Anxiety:   Anxiety: Irritability, Worrying, Tension  Psychosis:  Psychosis: N/A  Trauma:  Trauma: N/A  Obsessions:  Obsessions: Cause anxiety(his children)  Compulsions:  Compulsions: N/A  Inattention:  Inattention: N/A  Hyperactivity/Impulsivity:  Hyperactivity/Impulsivity: N/A  Oppositional/Defiant Behaviors:  Oppositional/Defiant Behaviors: N/A  Borderline Personality:  Emotional Irregularity: N/A  Other Mood/Personality Symptoms:      Mental Status Exam Appearance and self-care  Stature:  Stature: Average  Weight:  Weight: Thin  Clothing:  Clothing: Casual  Grooming:  Grooming: Neglected  Cosmetic use:  Cosmetic Use: None  Posture/gait:  Posture/Gait: Slumped  Motor activity:  Motor Activity: Not Remarkable  Sensorium  Attention:  Attention: Normal  Concentration:  Concentration: Normal  Orientation:  Orientation: X5  Recall/memory:  Recall/Memory: Normal   Affect and Mood  Affect:  Affect: Depressed  Mood:  Mood: Depressed  Relating  Eye contact:  Eye Contact: Normal  Facial expression:  Facial Expression: Depressed  Attitude toward examiner:  Attitude Toward Examiner: Cooperative  Thought and Language  Speech flow: Speech Flow: Normal  Thought content:  Thought Content: Appropriate to mood and circumstances  Preoccupation:     Hallucinations:     Organization:     Transport planner of Knowledge:  Fund of Knowledge: Impoverished by:  (Comment)  Intelligence:  Intelligence: Average  Abstraction:  Abstraction: Normal  Judgement:  Judgement: Fair  Art therapist:     Insight:  Insight: Fair  Decision Making:  Decision Making: Normal  Social Functioning  Social Maturity:  Social Maturity: Isolates  Social Judgement:  Social Judgement: Normal  Stress  Stressors:  Stressors: Family conflict, Illness, Money  Coping Ability:  Coping Ability: Deficient supports, English as a second language teacher Deficits:     Supports:      Family and Psychosocial History: Family history Marital status: Married What types of issues is patient dealing with in the relationship?: pt does not feel heard by his family.  Additional relationship information: pt is disabled and wife works full time. Does patient have children?: Yes How is patient's relationship with their children?: good with his kids, becomes emeshed with all of thier lives  Childhood History:  Childhood History By whom was/is the patient raised?: Both parents Additional childhood history information: good relationship with parents Description of patient's relationship with caregiver when they were a child: good Patient's description of current relationship with people who raised him/her: they are deceased Does patient have siblings?: Yes Number of Siblings: 7 Description of patient's current relationship with siblings: 2 are deceased ok relationship wavers, don't see them often Did patient  suffer any verbal/emotional/physical/sexual abuse as  a child?: No Did patient suffer from severe childhood neglect?: No Has patient ever been sexually abused/assaulted/raped as an adolescent or adult?: No Was the patient ever a victim of a crime or a disaster?: No Witnessed domestic violence?: No Has patient been effected by domestic violence as an adult?: No  CCA Part Two B  Employment/Work Situation: Employment / Work Copywriter, advertising Employment situation: On disability Why is patient on disability: mutiple health issues How long has patient been on disability: 14 Are There Guns or Other Weapons in [place]?: No  Education: Education Last Grade Completed: 16 Did Teacher, adult education From [employer]?: Yes Did Physicist, medical?: Yes What Type of College Degree Do you Have?: BS  Religion: Religion/Spirituality Are You A Religious Person?: Yes  Leisure/Recreation: Leisure / Recreation Leisure and Hobbies: spend time with family  Exercise/Diet: Exercise/Diet Do You Exercise?: No Have You Gained or Lost A Significant Amount of Weight in the Past Six Months?: No Do You Follow a Special Diet?: No Do You Have Any Trouble Sleeping?: No  CCA Part Two C  Alcohol/Drug Use: Alcohol / Drug Use History of alcohol / drug use?: No history of alcohol / drug abuse                      CCA Part Three  ASAM's:  Six Dimensions of Multidimensional Assessment  Dimension 1:  Acute Intoxication and/or Withdrawal Potential:     Dimension 2:  Biomedical Conditions and Complications:     Dimension 3:  Emotional, Behavioral, or Cognitive Conditions and Complications:     Dimension 4:  Readiness to Change:     Dimension 5:  Relapse, Continued use, or Continued Problem Potential:     Dimension 6:  Recovery/Living Environment:      Substance use Disorder (SUD)    Social Function:  Social Functioning Social Maturity: Isolates Social Judgement: Normal  Stress:  Stress Stressors: Family  conflict, Illness, Money Coping Ability: Deficient supports, Overwhelmed Patient Takes Medications The Way The Doctor Instructed?: Yes Priority Risk: Low Acuity  Risk Assessment- Self-Harm Potential: Risk Assessment For Self-Harm Potential Thoughts of Self-Harm: No current thoughts Method: No plan Availability of Means: No access/NA  Risk Assessment -Dangerous to Others Potential: Risk Assessment For Dangerous to Others Potential Method: No Plan Availability of Means: No access or NA Intent: Vague intent or NA Notification Required: No need or identified person  DSM5 Diagnoses: Patient Active Problem List   Diagnosis Date Noted  . 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 Moderate persistent asthma with acute exacerbation 09/29/2018  . Prediabetes 09/29/2018  . RTI (respiratory tract infection) 09/29/2018  . Bruising 11/17/2017  . Testicular pain, right 08/01/2017  . Fatigue 01/21/2017  . Ankle pain, left 01/21/2017  . Fall (on) (from) other stairs and steps, initial encounter 11/11/2016  . Abrasion of head 11/11/2016  . Thoracic aortic aneurysm ([place]) 07/23/2016  . Erectile dysfunction 07/19/2016  . Bronchiectasis ([place]) 04/23/2016  . Chronic bronchitis ([place]) 03/11/2016  . Acute URI 11/03/2015  . Cerumen impaction 08/04/2015  . Cervical disc disorder with radiculopathy of cervical region 06/06/2014  . Right elbow pain 06/06/2014  . Elevated WBC count 06/06/2014  . Iron deficiency anemia 06/06/2014  . Pre-operative exam 04/07/2014  . Chronic right SI joint pain 02/21/2014  . Edema 02/21/2014  . Tinnitus 02/21/2014  . Well adult exam 05/31/2013  . Glaucoma 05/31/2013  . RML pneumonia ([place]) 03/31/2013  . Hypertension, uncontrolled 02/25/2013  . Ingrowing toenail with infection 04/02/2012  .  Localized osteoarthritis of left knee 04/02/2012  . Vertigo 01/05/2012  . Ataxia 01/05/2012  . Anxiety state 08/08/2010  . ESOPHAGEAL STRICTURE 11/30/2009  . CHANGE IN BOWELS  11/30/2009  . Major depression, chronic 08/07/2009  . Osteoarthritis 08/07/2009  . GENERALIZED OSTEOARTHROSIS UNSPECIFIED SITE 10/20/2008  . Dyslipidemia 10/19/2008  . FOOT PAIN 07/11/2008  . Orchitis and epididymitis 05/24/2008  . RASH-NONVESICULAR 05/14/2008  . Cough 03/29/2008  . Coronary atherosclerosis 11/04/2007  . Lumbago 11/04/2007  . THYROIDECTOMY, HX OF 11/04/2007  . 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 NEOPLASM, MALIGNANT, THYROID GLAND 10/02/2007  . 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 IMPAIRED GLUCOSE TOLERANCE 10/02/2007    Patient Centered Plan: Patient is on the following Treatment Plan(s):  Depression and anxieity  Recommendations for Services/Supports/Treatments: Recommendations for Services/Supports/Treatments Recommendations For Services/Supports/Treatments: Individual Therapy, Medication Management  Treatment Plan Summary:    Referrals to Alternative Service(s): Referred to Alternative Service(s):   Place:   Date:   Time:    Referred to Alternative Service(s):   Place:   Date:   Time:    Referred to Alternative Service(s):   Place:   Date:   Time:    Referred to Alternative Service(s):   Place:   Date:   Time:     [name]

## 2019-01-06 NOTE — Addendum Note (Signed)
Addended by: [name] on: 01/06/2019 01:57 PM   Modules accepted: Level of Service

## 2019-01-18 IMAGING — CT CT NECK W/ CM
3 of 4 series · 12 of 33 positions shown, 14 images · IV contrast (omnipaque)
Comparison: MRI 07/20/2018

CLINICAL DATA: History of thyroid cancer with thyroidectomy in
4224. Balance disturbance and hearing loss. History of squamous cell
cancer the orbit

EXAM:
CT NECK WITH CONTRAST
TECHNIQUE: Multidetector CT imaging of the neck was performed using the
standard protocol following the bolus administration of intravenous
contrast.
CONTRAST:  75mL OMNIPAQUE IOHEXOL 300 MG/ML  SOLN

[Series 6: orthogonal ax · axial · 0.33mm/px · z∈[-275,-88]mm · 4 of 138 slices shown, 5 images]
[im 20/138  soft-tissue]
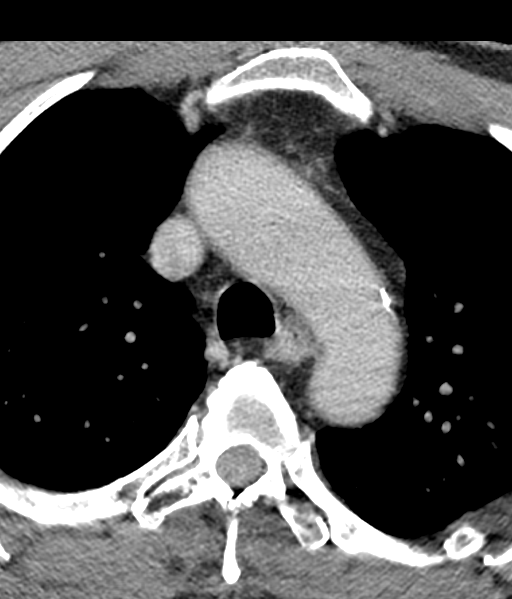
[im 20/138  bone]
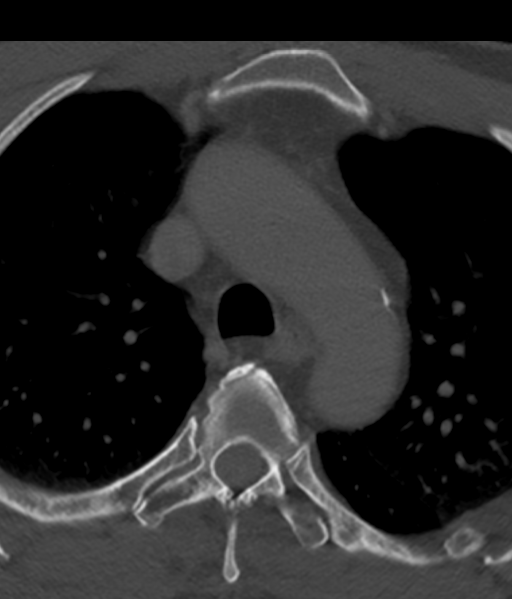
[im 59/138  bone]
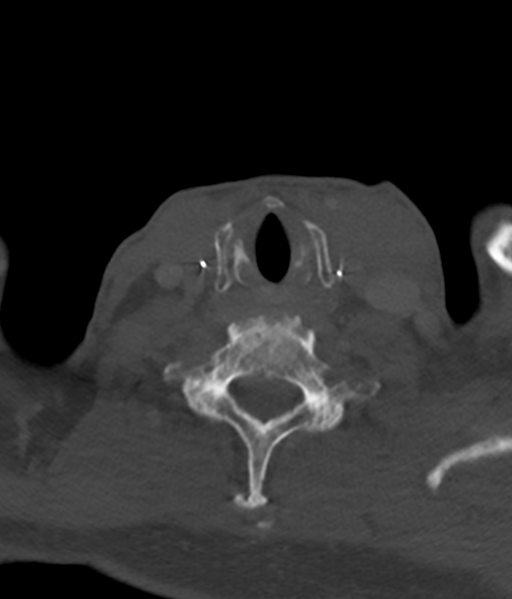
[im 79/138  bone]
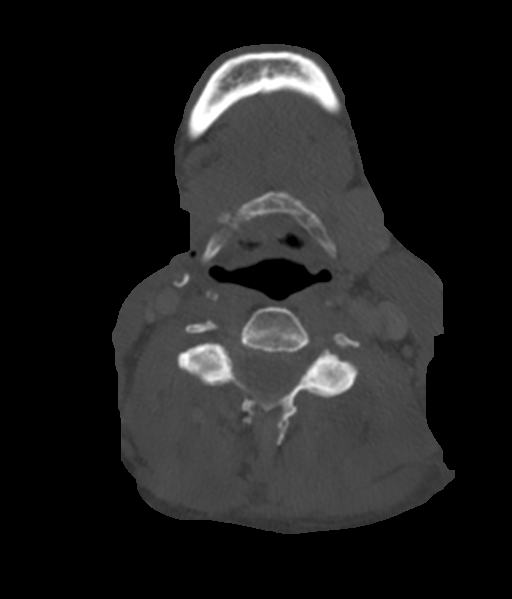
[im 118/138  bone]
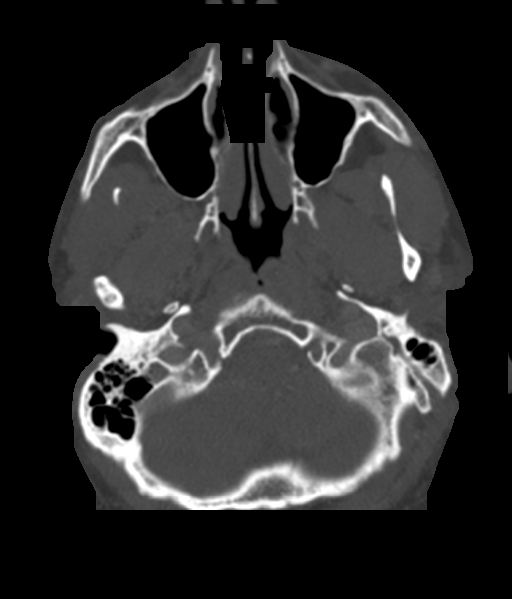

[Series 7: cor neck · coronal · 0.34mm/px · 3 of 101 slices shown]
[im 26/101  bone]
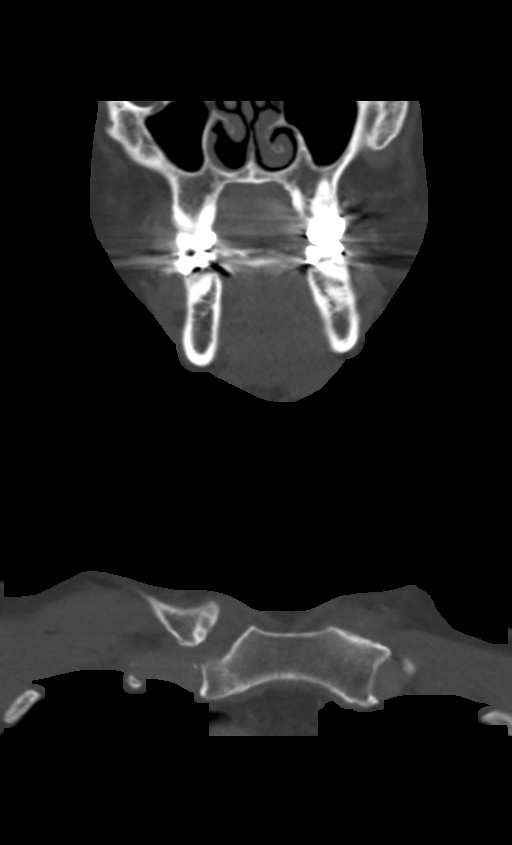
[im 42/101  bone]
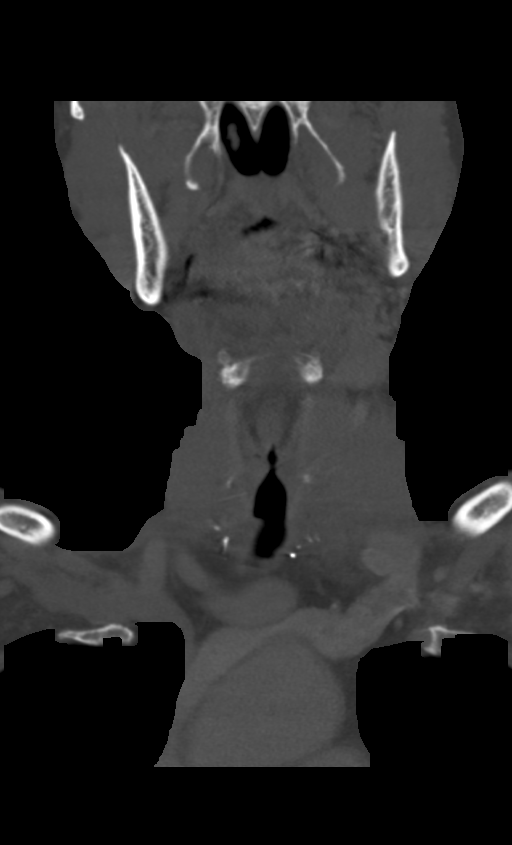
[im 59/101  bone]
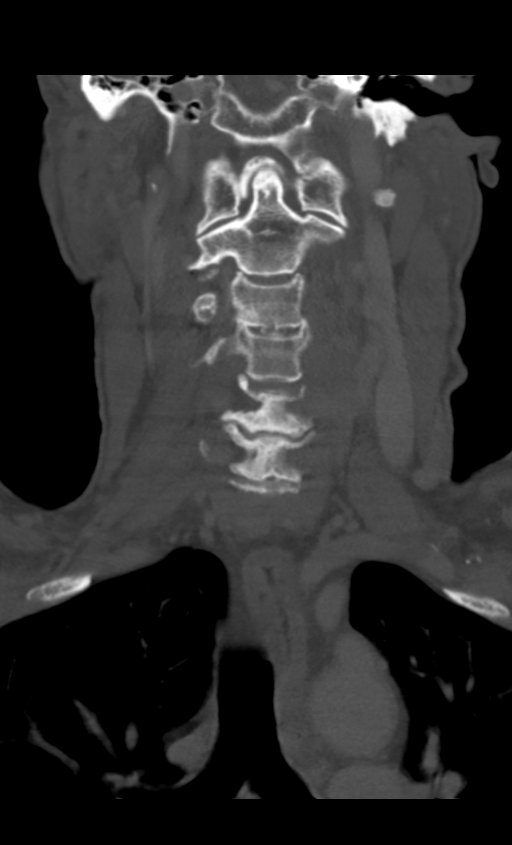

[Series 8: sag neck · sagittal · 0.37mm/px · 5 of 81 slices shown, 6 images]
[im 27/81  bone]
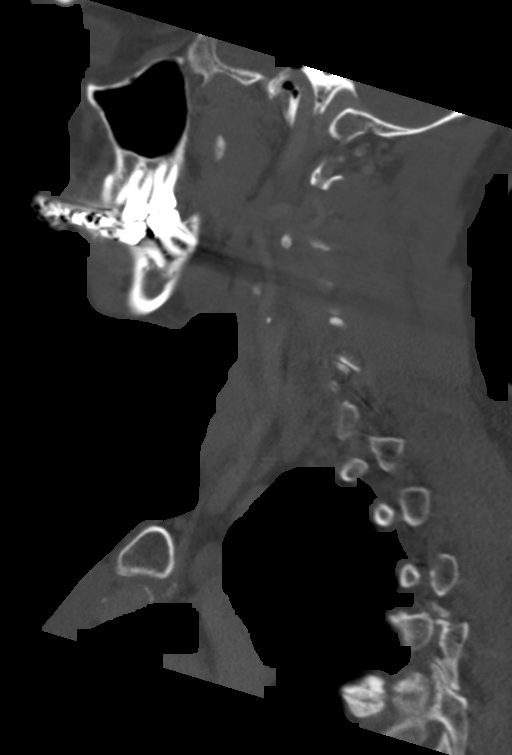
[im 34/81  bone]
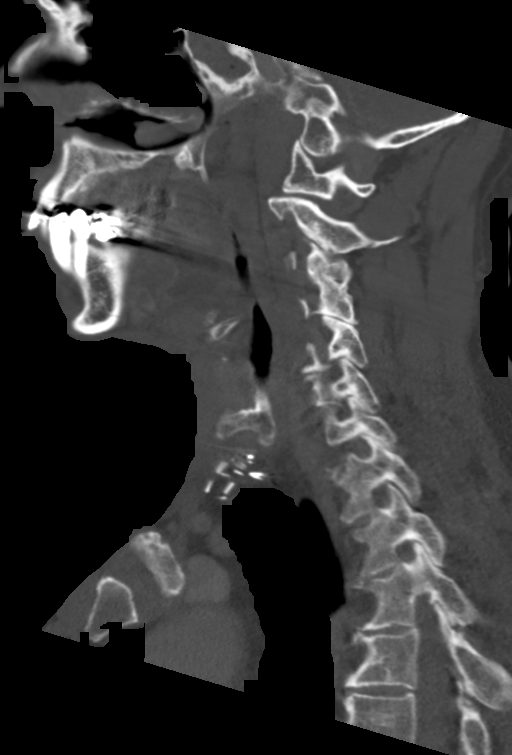
[im 41/81  soft-tissue]
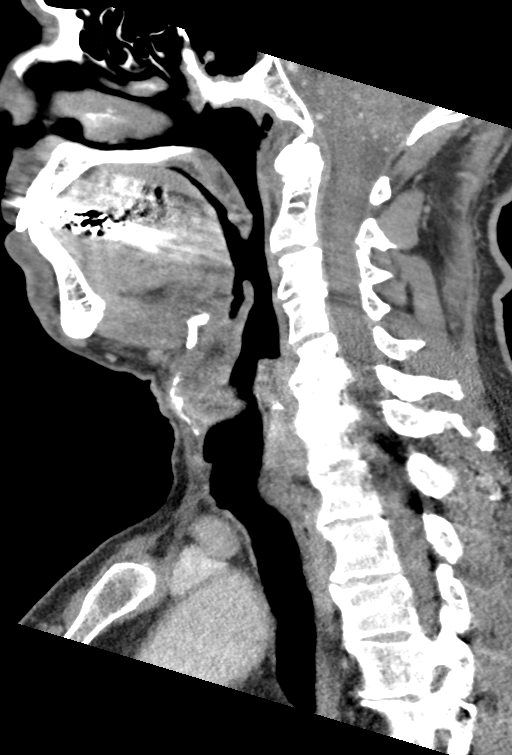
[im 41/81  bone]
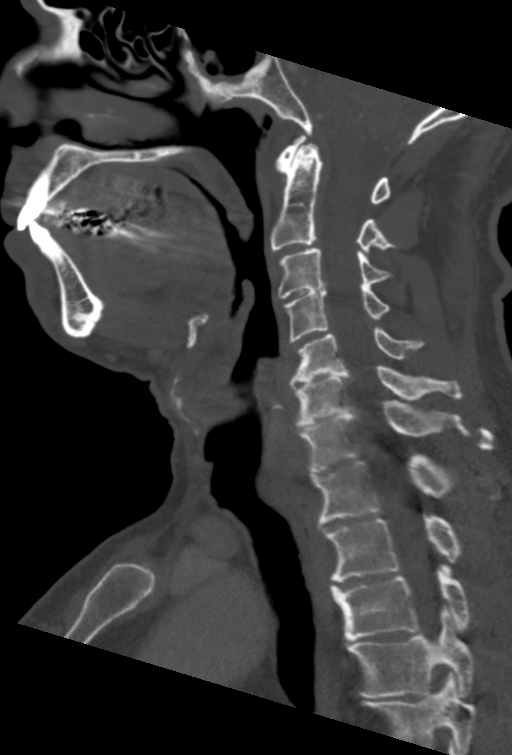
[im 47/81  bone]
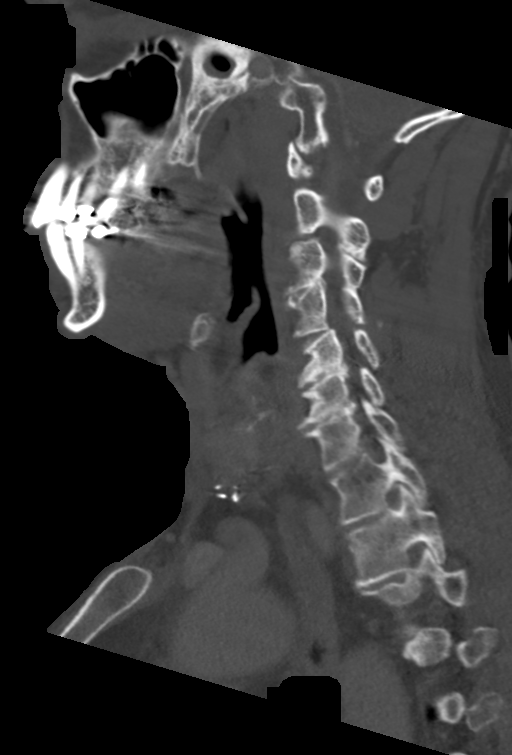
[im 54/81  bone]
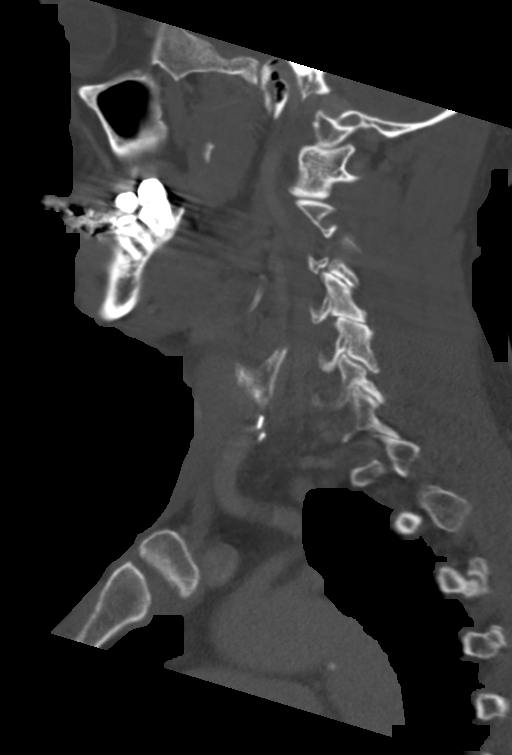

[12 of 33 positions shown; findings below may reference images not displayed]

FINDINGS: Pharynx and larynx: No mucosal or submucosal lesion.

Salivary glands: Left submandibular gland is normal. Right
submandibular gland is atrophic or resected. Parotid glands are
normal.

Thyroid: Previous thyroidectomy. No evidence of residual thyroid
tissue.

Lymph nodes: No enlarged nodes seen on either side of the neck.

Vascular: Carotid artery atherosclerosis.  Jugular veins are patent.

Limited intracranial: Normal

Visualized orbits: Normal

Mastoids and visualized paranasal sinuses: Cyst or polyp in the
right division of the sphenoid sinus. Chronic mucoperiosteal
thickening of the sphenoid sinuses. No fluid in the middle ears or
mastoids.

Skeleton: Mid cervical spondylosis.

Upper chest: Normal

Other: None
IMPRESSION: Previous thyroidectomy. No evidence residual or recurrent thyroid
disease.

Absent or atrophic right submandibular gland.

## 2019-01-25 ENCOUNTER — Ambulatory Visit (INDEPENDENT_AMBULATORY_CARE_PROVIDER_SITE_OTHER): Payer: 59 | Admitting: Licensed Clinical Social Worker

## 2019-01-25 ENCOUNTER — Other Ambulatory Visit (HOSPITAL_COMMUNITY): Payer: Self-pay | Admitting: Psychiatry

## 2019-01-25 ENCOUNTER — Other Ambulatory Visit: Payer: Self-pay

## 2019-01-25 ENCOUNTER — Encounter (HOSPITAL_COMMUNITY): Payer: Self-pay | Admitting: Licensed Clinical Social Worker

## 2019-01-25 DIAGNOSIS — F319 Bipolar disorder, unspecified: Secondary | ICD-10-CM | POA: Diagnosis not present

## 2019-01-25 DIAGNOSIS — F411 Generalized anxiety disorder: Secondary | ICD-10-CM

## 2019-01-25 NOTE — Progress Notes (Signed)
Virtual Visit via Telephone Note  I connected with Yoniel Arkwright Frese on 12/07/2018 at  1:00 PM EDT by phone as pt is uncomfortable at this time to use video enabled telemedicine application and verified that I am speaking with the correct person using two identifiers.   I discussed the limitations of evaluation and management by telemedicine and the availability of in person appointments. The patient expressed understanding and agreed to proceed.  History of Present Illness:  Pt was referred by Dr. Adele Schilder for OP therapy for bipolar disorder.    Observations/Objective: Pt presents anxious for his phone therapy session.Pt reports he is anxious today. Pt discussed his psychiatric symptoms and current life events. Pt reports on his continued life stressors: family dysfunction, vacation, son in hospital, daughter transferring from [facility] to [place] to live at home. "I will now have 3 grown children living in my house." Discuss options again with pt. Went to [place] [employer] with part of family but other family members at home had emergencies which interfered with the vacation. Pt is anxious about his son who continues to self harm. Discussed options and alternatives for his son. Discussed coping skill for this stressor.   Follow Up Instructions:  I discussed the assessment and treatment plan with the patient. The patient was provided an opportunity to ask questions and all were answered. The patient agreed with the plan and demonstrated an understanding of the instructions.   The patient was advised to call back or seek an in-person evaluation if the symptoms worsen or if the condition fails to improve as anticipated.  I provided 60 minutes of non-face-to-face time during this encounter.   Charly Holcomb S, LCAS

## 2019-01-26 ENCOUNTER — Other Ambulatory Visit (INDEPENDENT_AMBULATORY_CARE_PROVIDER_SITE_OTHER): Payer: 59

## 2019-01-26 ENCOUNTER — Other Ambulatory Visit: Payer: Self-pay

## 2019-01-26 ENCOUNTER — Ambulatory Visit (INDEPENDENT_AMBULATORY_CARE_PROVIDER_SITE_OTHER): Payer: 59 | Admitting: Internal Medicine

## 2019-01-26 ENCOUNTER — Encounter: Payer: Self-pay | Admitting: Internal Medicine

## 2019-01-26 VITALS — BP 134/86 | HR 69 | Temp 97.8°F | Ht 71.0 in | Wt 176.0 lb

## 2019-01-26 DIAGNOSIS — E559 Vitamin D deficiency, unspecified: Secondary | ICD-10-CM

## 2019-01-26 DIAGNOSIS — I2510 Atherosclerotic heart disease of native coronary artery without angina pectoris: Secondary | ICD-10-CM

## 2019-01-26 DIAGNOSIS — F329 Major depressive disorder, single episode, unspecified: Secondary | ICD-10-CM

## 2019-01-26 DIAGNOSIS — E291 Testicular hypofunction: Secondary | ICD-10-CM | POA: Diagnosis not present

## 2019-01-26 DIAGNOSIS — I10 Essential (primary) hypertension: Secondary | ICD-10-CM | POA: Diagnosis not present

## 2019-01-26 DIAGNOSIS — R202 Paresthesia of skin: Secondary | ICD-10-CM

## 2019-01-26 DIAGNOSIS — R0789 Other chest pain: Secondary | ICD-10-CM

## 2019-01-26 DIAGNOSIS — F411 Generalized anxiety disorder: Secondary | ICD-10-CM

## 2019-01-26 LAB — CBC WITH DIFFERENTIAL/PLATELET
Basophils Absolute: 0.1 10*3/uL (ref 0.0–0.1)
Basophils Relative: 1.1 % (ref 0.0–3.0)
Eosinophils Absolute: 1 10*3/uL — ABNORMAL HIGH (ref 0.0–0.7)
Eosinophils Relative: 11.3 % — ABNORMAL HIGH (ref 0.0–5.0)
HCT: 50.8 % (ref 39.0–52.0)
Hemoglobin: 16.8 g/dL (ref 13.0–17.0)
Lymphocytes Relative: 18 % (ref 12.0–46.0)
Lymphs Abs: 1.7 10*3/uL (ref 0.7–4.0)
MCHC: 33.1 g/dL (ref 30.0–36.0)
MCV: 81.9 fl (ref 78.0–100.0)
Monocytes Absolute: 1 10*3/uL (ref 0.1–1.0)
Monocytes Relative: 11.3 % (ref 3.0–12.0)
Neutro Abs: 5.4 10*3/uL (ref 1.4–7.7)
Neutrophils Relative %: 58.3 % (ref 43.0–77.0)
Platelets: 270 10*3/uL (ref 150.0–400.0)
RBC: 6.2 Mil/uL — ABNORMAL HIGH (ref 4.22–5.81)
RDW: 16 % — ABNORMAL HIGH (ref 11.5–15.5)
WBC: 9.3 10*3/uL (ref 4.0–10.5)

## 2019-01-26 LAB — BASIC METABOLIC PANEL
BUN: 13 mg/dL (ref 6–23)
CO2: 28 mEq/L (ref 19–32)
Calcium: 9.1 mg/dL (ref 8.4–10.5)
Chloride: 101 mEq/L (ref 96–112)
Creatinine, Ser: 0.95 mg/dL (ref 0.40–1.50)
GFR: 79.53 mL/min (ref >60.00)
Glucose, Bld: 83 mg/dL (ref 70–99)
Potassium: 4.3 mEq/L (ref 3.5–5.1)
Sodium: 138 mEq/L (ref 135–145)

## 2019-01-26 LAB — HEPATIC FUNCTION PANEL
ALT: 40 U/L (ref 0–53)
AST: 34 U/L (ref 0–37)
Albumin: 4.5 g/dL (ref 3.5–5.2)
Alkaline Phosphatase: 76 U/L (ref 39–117)
Bilirubin, Direct: 0.2 mg/dL (ref 0.0–0.3)
Total Bilirubin: 1 mg/dL (ref 0.2–1.2)
Total Protein: 6.6 g/dL (ref 6.0–8.3)

## 2019-01-26 LAB — TESTOSTERONE: Testosterone: 413.18 ng/dL (ref 300.00–890.00)

## 2019-01-26 LAB — VITAMIN B12: Vitamin B-12: >1500 pg/mL — ABNORMAL HIGH (ref 211–911)

## 2019-01-26 LAB — LIPID PANEL
Cholesterol: 156 mg/dL (ref 0–200)
HDL: 73 mg/dL (ref >39.00)
LDL Cholesterol: 63 mg/dL (ref 0–99)
NonHDL: 83.2
Total CHOL/HDL Ratio: 2
Triglycerides: 103 mg/dL (ref 0.0–149.0)
VLDL: 20.6 mg/dL (ref 0.0–40.0)

## 2019-01-26 LAB — TSH: TSH: 3 u[IU]/mL (ref 0.35–4.50)

## 2019-01-26 LAB — VITAMIN D 25 HYDROXY (VIT D DEFICIENCY, FRACTURES): VITD: 53.01 ng/mL (ref 30.00–100.00)

## 2019-01-26 LAB — PSA: PSA: 3.04 ng/mL (ref 0.10–4.00)

## 2019-01-26 LAB — T4, FREE: Free T4: 0.99 ng/dL (ref 0.60–1.60)

## 2019-01-26 NOTE — Assessment & Plan Note (Signed)
F/u w/Cardiology 

## 2019-01-26 NOTE — Addendum Note (Signed)
Addended by: Karren Cobble on: 01/26/2019 10:10 AM   Modules accepted: Orders

## 2019-01-26 NOTE — Assessment & Plan Note (Signed)
Amlodipine, Micardis

## 2019-01-26 NOTE — Assessment & Plan Note (Addendum)
Testosteron Labs  Potential benefits of a long term sex steroid  use as well as potential risks  and complications were explained to the patient and were aknowledged.

## 2019-01-26 NOTE — Patient Instructions (Signed)
Hold Lipitor x 5-7 days

## 2019-01-26 NOTE — Assessment & Plan Note (Signed)
Dr Adele Schilder Lorazepam prn  Potential benefits of a long term benzodiazepines  use as well as potential risks  and complications were explained to the patient and were aknowledged.

## 2019-01-26 NOTE — Assessment & Plan Note (Signed)
No exertional sx's Card ref

## 2019-01-26 NOTE — Assessment & Plan Note (Signed)
Stress discussed 

## 2019-01-26 NOTE — Assessment & Plan Note (Signed)
ASA, amlodipine, krill oil

## 2019-01-26 NOTE — Progress Notes (Signed)
Subjective:  Patient ID: Alex [name], male    DOB: April 20, 1956  Age: 63 y.o. MRN: [id number]  CC: No chief complaint on file.   HPI Matan Steen Stettler presents for arthralgias, asthma, anxiety, stress f/u  Outpatient Medications Prior to Visit  Medication Sig Dispense Refill  . acetaminophen (TYLENOL) 500 MG tablet Take 1,000 mg by mouth 2 (two) times daily as needed for headache.    [name] aspirin EC 81 MG tablet Take 1 tablet by mouth daily.    [name] atorvastatin (LIPITOR) 40 MG tablet TAKE 1 TABLET BY MOUTH EVERYDAY AT BEDTIME 90 tablet 3  . AXIRON 30 MG/ACT SOLN Place 2 Act onto the skin daily.    . budesonide-formoterol (SYMBICORT) 80-4.5 MCG/ACT inhaler Inhale 2 puffs into the lungs 2 (two) times daily. 3 Inhaler 1  . Coenzyme Q10 (CO Q 10 PO) Take 1 capsule by mouth daily.     . fluticasone (FLONASE) 50 MCG/ACT nasal spray Place 2 sprays into both nostrils daily. 16 g 6  . Immune Globulin, Human, (HIZENTRA) 4 GM/20ML SOLN Inject into the skin once a week. wednesday    . LamoTRIgine 300 MG TB24 24 hour tablet Take 1 tablet (300 mg total) by mouth every morning. 90 tablet 0  . latanoprost (XALATAN) 0.005 % ophthalmic solution 1 DROP EACH EYE AT NIGHT    . levofloxacin (LEVAQUIN) 750 MG tablet Take 1 tablet (750 mg total) by mouth daily. 5 tablet 0  . levothyroxine (SYNTHROID, LEVOTHROID) 150 MCG tablet TAKE 1 TABLET BY MOUTH DAILY BEFORE BREAKFAST. 90 tablet 0  . lidocaine-prilocaine (EMLA) cream Apply 1 application topically as needed.     [name] LORazepam (ATIVAN) 0.5 MG tablet Take 1 tab daily as needed and 1 tab at bed time 45 tablet 1  . LUTEIN PO Take 1 tablet by mouth daily.     [name] LYCOPENE PO Take by mouth.    . meloxicam (MOBIC) 15 MG tablet Take 1 tablet (15 mg total) by mouth daily as needed. Patient needs office visit before refills will be given 30 tablet 0  . metoprolol succinate (TOPROL-XL) 50 MG 24 hr tablet Take 1 tablet (50 mg total) by mouth daily. 90 tablet 3  . Multiple  Vitamin (MULTI-VITAMINS) TABS Take by mouth.    . OMEGA-3 KRILL OIL PO Take 1 capsule by mouth daily.     [name] omeprazole (PRILOSEC) 20 MG capsule Take 20 mg by mouth every evening.    . Saw Palmetto, Serenoa repens, (SAW PALMETTO PO) Take by mouth.    . sildenafil (REVATIO) 20 MG tablet Take 1-5 tablets (20-100 mg total) by mouth daily as needed. 30 tablet 11  . sildenafil (VIAGRA) 25 MG tablet Take 5 mg by mouth daily as needed (BPH).     [name] Spacer/Aero-Holding Chambers (AEROCHAMBER MV) inhaler Use as instructed 1 each 0  . sucralfate (CARAFATE) 1 G tablet Take 1 g by mouth daily as needed (ACID REFLUX).   3  . tadalafil (ADCIRCA/CIALIS) 20 MG tablet TAKE 1 TABLET BY MOUTH ONCE DAILY AS NEEDED FOR ERECTILE DYSFUNCTION 30 tablet 1  . telmisartan (MICARDIS) 80 MG tablet TAKE 1 TABLET (80 MG TOTAL) BY MOUTH DAILY. 90 tablet 2  . zolpidem (AMBIEN) 10 MG tablet Take 1/2 tab at bed time and take other half if needed 30 tablet 1   No facility-administered medications prior to visit.     ROS: Review of Systems  Constitutional: Positive for fatigue. Negative for appetite change and  unexpected weight change.  HENT: Negative for congestion, nosebleeds, sneezing, sore throat and trouble swallowing.   Eyes: Negative for itching and visual disturbance.  Respiratory: Negative for cough.   Cardiovascular: Negative for chest pain, palpitations and leg swelling.  Gastrointestinal: Negative for abdominal distention, blood in stool, diarrhea and nausea.  Genitourinary: Negative for frequency and hematuria.  Musculoskeletal: Positive for arthralgias, back pain, neck pain and neck stiffness. Negative for gait problem and joint swelling.  Skin: Negative for rash.  Neurological: Positive for dizziness. Negative for tremors, speech difficulty and weakness.  Psychiatric/Behavioral: Positive for dysphoric mood. Negative for agitation, sleep disturbance and suicidal ideas. The patient is nervous/anxious.      Objective:  BP 134/86 (BP Location: Left Arm, Patient Position: Sitting, Cuff Size: Normal)   Pulse 69   Temp 97.8 F (36.6 C) (Oral)   Ht 5\' 11"  (1.803 m)   Wt 176 lb (79.8 kg)   SpO2 98%   BMI 24.55 kg/m   BP Readings from Last 3 Encounters:  01/26/19 134/86  09/29/18 (!) 142/96  08/04/18 124/83    Wt Readings from Last 3 Encounters:  01/26/19 176 lb (79.8 kg)  09/29/18 182 lb 12 oz (82.9 kg)  08/04/18 181 lb 4.8 oz (82.2 kg)    Physical Exam Constitutional:      General: He is not in acute distress.    Appearance: He is well-developed.     Comments: NAD  Eyes:     Conjunctiva/sclera: Conjunctivae normal.     Pupils: Pupils are equal, round, and reactive to light.  Neck:     Musculoskeletal: Normal range of motion.     Thyroid: No thyromegaly.     Vascular: No JVD.  Cardiovascular:     Rate and Rhythm: Normal rate and regular rhythm.     Heart sounds: Normal heart sounds. No murmur. No friction rub. No gallop.   Pulmonary:     Effort: Pulmonary effort is normal. No respiratory distress.     Breath sounds: Normal breath sounds. No wheezing or rales.  Chest:     Chest wall: No tenderness.  Abdominal:     General: Bowel sounds are normal. There is no distension.     Palpations: Abdomen is soft. There is no mass.     Tenderness: There is no abdominal tenderness. There is no guarding or rebound.  Musculoskeletal: Normal range of motion.        General: No tenderness.  Lymphadenopathy:     Cervical: No cervical adenopathy.  Skin:    General: Skin is warm and dry.     Findings: No rash.  Neurological:     Mental Status: He is alert and oriented to person, place, and time.     Cranial Nerves: No cranial nerve deficit.     Motor: No abnormal muscle tone.     Coordination: Coordination normal.     Gait: Gait normal.     Deep Tendon Reflexes: Reflexes are normal and symmetric.  Psychiatric:        Behavior: Behavior normal.        Thought Content: Thought  content normal.        Judgment: Judgment normal.     Lab Results  Component Value Date   WBC 13.2 (H) 09/29/2018   HGB 16.5 09/29/2018   HCT 50.1 09/29/2018   PLT 266.0 09/29/2018   GLUCOSE 101 (H) 09/29/2018   CHOL 166 05/12/2018   TRIG 85 05/12/2018   HDL 74 05/12/2018  LDLDIRECT 59.0 02/02/2016   LDLCALC 75 05/12/2018   ALT 39 06/08/2018   AST 31 06/08/2018   NA 137 09/29/2018   K 3.8 09/29/2018   CL 101 09/29/2018   CREATININE 1.02 09/29/2018   BUN 15 09/29/2018   CO2 27 09/29/2018   TSH 3.95 09/29/2018   PSA 1.5 07/23/2016   INR 0.87 06/08/2018   HGBA1C 5.5 09/29/2018    Dg Chest 2 View  Result Date: 09/29/2018 CLINICAL DATA:  Cough EXAM: CHEST - 2 VIEW COMPARISON:  11/10/2015 FINDINGS: Cardiac and mediastinal contours normal. Vascularity normal. Lungs are clear without infiltrate or effusion. Chronic left upper rib fractures unchanged from the prior study. Surgical clips in the thyroid bed bilaterally. IMPRESSION: No active cardiopulmonary disease. Electronically Signed   By: Franchot Gallo [name]   On: 09/29/2018 15:44    Assessment & Plan:     Follow-up: No follow-ups on file.  Walker Kehr, MD

## 2019-01-27 ENCOUNTER — Other Ambulatory Visit (HOSPITAL_COMMUNITY): Payer: Self-pay

## 2019-01-27 ENCOUNTER — Encounter: Payer: Self-pay | Admitting: Oncology

## 2019-01-27 DIAGNOSIS — F319 Bipolar disorder, unspecified: Secondary | ICD-10-CM

## 2019-01-27 DIAGNOSIS — F411 Generalized anxiety disorder: Secondary | ICD-10-CM

## 2019-01-27 MED ORDER — ZOLPIDEM TARTRATE 10 MG PO TABS: ORAL_TABLET | ORAL | 0 refills | Status: DC

## 2019-01-27 MED ORDER — LORAZEPAM 0.5 MG PO TABS: ORAL_TABLET | ORAL | 0 refills | Status: DC

## 2019-01-28 ENCOUNTER — Other Ambulatory Visit: Payer: Self-pay | Admitting: Dermatology

## 2019-01-28 ENCOUNTER — Other Ambulatory Visit: Payer: Self-pay | Admitting: Internal Medicine

## 2019-02-01 ENCOUNTER — Other Ambulatory Visit: Payer: Self-pay | Admitting: Oncology

## 2019-02-08 ENCOUNTER — Ambulatory Visit (HOSPITAL_COMMUNITY): Payer: 59 | Admitting: Licensed Clinical Social Worker

## 2019-02-09 ENCOUNTER — Telehealth (HOSPITAL_COMMUNITY): Payer: Self-pay | Admitting: Licensed Clinical Social Worker

## 2019-02-09 NOTE — Telephone Encounter (Signed)
Pt emailed before his phone enabled therapy appointment. to resechedule his appointment.  Abbey Veith. LCAS

## 2019-02-16 ENCOUNTER — Encounter (HOSPITAL_COMMUNITY): Payer: Self-pay | Admitting: Licensed Clinical Social Worker

## 2019-02-16 ENCOUNTER — Other Ambulatory Visit: Payer: Self-pay

## 2019-02-16 ENCOUNTER — Ambulatory Visit (INDEPENDENT_AMBULATORY_CARE_PROVIDER_SITE_OTHER): Payer: 59 | Admitting: Licensed Clinical Social Worker

## 2019-02-16 DIAGNOSIS — F319 Bipolar disorder, unspecified: Secondary | ICD-10-CM | POA: Diagnosis not present

## 2019-02-16 NOTE — Progress Notes (Signed)
Virtual Visit via Telephone Note  I connected with Alex [name] on 02/16/2019 at  4:00 PM EDT by phone as pt is uncomfortable at this time to use video enabled telemedicine application and verified that I am speaking with the correct person using two identifiers.   I discussed the limitations of evaluation and management by telemedicine and the availability of in person appointments. The patient expressed understanding and agreed to proceed.  History of Present Illness:  Pt was referred by Dr. Adele Schilder for OP therapy for bipolar disorder.    Observations/Objective: Pt presents anxious for his phone therapy session. Pt discussed his psychiatric symptoms and current life events. His major stressor currently is his son, who continues with self harm and was just released from the hospital. He assisted his son being released so he could not be terminated from his job. Asked open ended questions about options for son, his future, medically and mentally. And the effects on his own life, goals and future. His life continues to be full of family dysfunction that he will not deal with. Patient reports his frustration as how his life turned out.  PLAN: integrity vs despair. Gerhard Perches stages of life development Acceptance of future    Follow Up Instructions:  I discussed the assessment and treatment plan with the patient. The patient was provided an opportunity to ask questions and all were answered. The patient agreed with the plan and demonstrated an understanding of the instructions.   The patient was advised to call back or seek an in-person evaluation if the symptoms worsen or if the condition fails to improve as anticipated.  I provided 45 minutes of non-face-to-face time during this encounter.   Taylen Wendland S, LCAS

## 2019-02-22 ENCOUNTER — Other Ambulatory Visit: Payer: Self-pay

## 2019-02-22 ENCOUNTER — Encounter (HOSPITAL_COMMUNITY): Payer: Self-pay | Admitting: Licensed Clinical Social Worker

## 2019-02-22 ENCOUNTER — Ambulatory Visit (INDEPENDENT_AMBULATORY_CARE_PROVIDER_SITE_OTHER): Payer: 59 | Admitting: Licensed Clinical Social Worker

## 2019-02-22 DIAGNOSIS — F319 Bipolar disorder, unspecified: Secondary | ICD-10-CM

## 2019-02-22 NOTE — Progress Notes (Signed)
Virtual Visit via Telephone Note  I connected with Alex [name] on 02/22/2019 at  1:00 PM EDT by phone as pt is uncomfortable at this time to use video enabled telemedicine application and verified that I am speaking with the correct person using two identifiers.   I discussed the limitations of evaluation and management by telemedicine and the availability of in person appointments. The patient expressed understanding and agreed to proceed.  History of Present Illness:  Pt was referred by Dr. Adele Schilder for OP therapy for bipolar disorder.    Observations/Objective: Pt presents anxious for his phone therapy session. Pt discussed his psychiatric symptoms and current life events. Pt reports his stress is high. He is recalling negative things in his past, statements made to him, negative thoughts. Emailed pt mindfulness techniques, explained the benefits, process of mindfulness. Emailed pt relaxation techniques and explained fight vs flight response. Coached pt on focusing on here and now using grounding techniques. Will rediscuss the emailed worksheets at next session.   PLAN: DIscuss integrity vs despair. Alex [name] psychosocial stages of life development Acceptance of future    Follow Up Instructions:  I discussed the assessment and treatment plan with the patient. The patient was provided an opportunity to ask questions and all were answered. The patient agreed with the plan and demonstrated an understanding of the instructions.   The patient was advised to call back or seek an in-person evaluation if the symptoms worsen or if the condition fails to improve as anticipated.  I provided 45 minutes of non-face-to-face time during this encounter.   Alex [name], LCAS

## 2019-02-23 ENCOUNTER — Other Ambulatory Visit (HOSPITAL_COMMUNITY): Payer: Self-pay

## 2019-02-23 DIAGNOSIS — F319 Bipolar disorder, unspecified: Secondary | ICD-10-CM

## 2019-02-23 DIAGNOSIS — F411 Generalized anxiety disorder: Secondary | ICD-10-CM

## 2019-02-23 MED ORDER — ZOLPIDEM TARTRATE 10 MG PO TABS: ORAL_TABLET | ORAL | 0 refills | Status: DC

## 2019-02-23 MED ORDER — LORAZEPAM 0.5 MG PO TABS: ORAL_TABLET | ORAL | 0 refills | Status: DC

## 2019-03-03 ENCOUNTER — Encounter (HOSPITAL_COMMUNITY): Payer: Self-pay | Admitting: Psychiatry

## 2019-03-03 ENCOUNTER — Other Ambulatory Visit: Payer: Self-pay

## 2019-03-03 ENCOUNTER — Ambulatory Visit (INDEPENDENT_AMBULATORY_CARE_PROVIDER_SITE_OTHER): Payer: 59 | Admitting: Psychiatry

## 2019-03-03 DIAGNOSIS — F319 Bipolar disorder, unspecified: Secondary | ICD-10-CM

## 2019-03-03 DIAGNOSIS — F411 Generalized anxiety disorder: Secondary | ICD-10-CM

## 2019-03-03 MED ORDER — LORAZEPAM 0.5 MG PO TABS: ORAL_TABLET | ORAL | 2 refills | Status: DC

## 2019-03-03 MED ORDER — ZOLPIDEM TARTRATE 10 MG PO TABS: ORAL_TABLET | ORAL | 2 refills | Status: DC

## 2019-03-03 MED ORDER — LAMOTRIGINE ER 300 MG PO TB24: 1.0000 | ORAL_TABLET | ORAL | 0 refills | Status: DC

## 2019-03-03 NOTE — Progress Notes (Signed)
Virtual Visit via Telephone Note  I connected with Alex [name] on 03/03/19 at  3:20 PM EDT by telephone and verified that I am speaking with the correct person using two identifiers.   I discussed the limitations, risks, security and privacy concerns of performing an evaluation and management service by telephone and the availability of in person appointments. I also discussed with the patient that there may be a patient responsible charge related to this service. The patient expressed understanding and agreed to proceed.   History of Present Illness: Patient was evaluated by phone session.  He was very worried about his son [name] who was admitted briefly in the hospital after ingesting hanger.  Patient told that his son was very worried about his losing his job but likely he is doing much better and his job is now shifted to night.  Patient told things are going the same.  He has concerned about his son who has mental issues.  His son is been in and out [facility].  Patient continues to endorse chronic financial and family issues but overall things are going well.  He is sleeping good.  He takes Ambien and lorazepam.  He is no longer taking Remeron which he believes makes him more sad and depressed.  He is seeing [employer] on a regular basis.  Recently he has blood work and he has mild eosinophilia and he was told it could be due to allergies.  His TSH and other labs are normal.  He is taking Lamictal without any side effects.  Denies any mania or any mood swings.  Denies any suicidal thoughts or homicidal thoughts.  His energy level is fair.  His appetite is okay.  He like to continue his current medication.  He denies drinking or using any illegal substances.   Past Psychiatric History:Reviewed. H/O depression, mood swing, anger and [facility] due to suicidal thoughts butno attempt. No history ofpsychosis buth/o poor impulse control. In the  past tookSeroquel, Cymbalta and Remeron (increase depression)  Recent Results (from the past 2160 hour(s))  Vitamin B12     Status: Abnormal   Collection Time: 01/26/19  9:37 AM  Result Value Ref Range   Vitamin B-12 >1500 (H) 211 - 911 pg/mL  VITAMIN D 25 Hydroxy (Vit-D Deficiency, Fractures)     Status: None   Collection Time: 01/26/19  9:37 AM  Result Value Ref Range   VITD 53.01 30.00 - 100.00 ng/mL  Lipid panel     Status: None   Collection Time: 01/26/19  9:37 AM  Result Value Ref Range   Cholesterol 156 0 - 200 mg/dL    Comment: ATP III Classification       Desirable:  < 200 mg/dL               Borderline High:  200 - 239 mg/dL          High:  > = 240 mg/dL   Triglycerides 103.0 0.0 - 149.0 mg/dL    Comment: Normal:  <150 mg/dLBorderline High:  150 - 199 mg/dL   HDL 73.00 >39.00 mg/dL   VLDL 20.6 0.0 - 40.0 mg/dL   LDL Cholesterol 63 0 - 99 mg/dL   Total CHOL/HDL Ratio 2     Comment:                Men          Women1/2 Average Risk     3.4  3.3Average Risk          5.0          4.42X Average Risk          9.6          7.13X Average Risk          15.0          11.0                       NonHDL 83.20     Comment: NOTE:  Non-HDL goal should be 30 mg/dL higher than patient's LDL goal (i.e. LDL goal of < 70 mg/dL, would have non-HDL goal of < 100 mg/dL)  Hepatic function panel     Status: None   Collection Time: 01/26/19  9:37 AM  Result Value Ref Range   Total Bilirubin 1.0 0.2 - 1.2 mg/dL   Bilirubin, Direct 0.2 0.0 - 0.3 mg/dL   Alkaline Phosphatase 76 39 - 117 U/L   AST 34 0 - 37 U/L   ALT 40 0 - 53 U/L   Total Protein 6.6 6.0 - 8.3 g/dL   Albumin 4.5 3.5 - 5.2 g/dL  Basic metabolic panel     Status: None   Collection Time: 01/26/19  9:37 AM  Result Value Ref Range   Sodium 138 135 - 145 mEq/L   Potassium 4.3 3.5 - 5.1 mEq/L   Chloride 101 96 - 112 mEq/L   CO2 28 19 - 32 mEq/L   Glucose, Bld 83 70 - 99 mg/dL   BUN 13 6 - 23 mg/dL   Creatinine, Ser 0.95 0.40  - 1.50 mg/dL   Calcium 9.1 8.4 - 10.5 mg/dL   GFR 79.53 >60.00 mL/min  CBC with Differential/Platelet     Status: Abnormal   Collection Time: 01/26/19  9:37 AM  Result Value Ref Range   WBC 9.3 4.0 - 10.5 K/uL   RBC 6.20 (H) 4.22 - 5.81 Mil/uL   Hemoglobin 16.8 13.0 - 17.0 g/dL   HCT 50.8 39.0 - 52.0 %   MCV 81.9 78.0 - 100.0 fl   MCHC 33.1 30.0 - 36.0 g/dL   RDW 16.0 (H) 11.5 - 15.5 %   Platelets 270.0 150.0 - 400.0 K/uL   Neutrophils Relative % 58.3 43.0 - 77.0 %   Lymphocytes Relative 18.0 12.0 - 46.0 %   Monocytes Relative 11.3 3.0 - 12.0 %   Eosinophils Relative 11.3 (H) 0.0 - 5.0 %   Basophils Relative 1.1 0.0 - 3.0 %   Neutro Abs 5.4 1.4 - 7.7 K/uL   Lymphs Abs 1.7 0.7 - 4.0 K/uL   Monocytes Absolute 1.0 0.1 - 1.0 K/uL   Eosinophils Absolute 1.0 (H) 0.0 - 0.7 K/uL   Basophils Absolute 0.1 0.0 - 0.1 K/uL  T4, free     Status: None   Collection Time: 01/26/19  9:37 AM  Result Value Ref Range   Free T4 0.99 0.60 - 1.60 ng/dL    Comment: Specimens from patients who are undergoing biotin therapy and /or ingesting biotin supplements may contain high levels of biotin.  The higher biotin concentration in these specimens interferes with this Free T4 assay.  Specimens that contain high levels  of biotin may cause false high results for this Free T4 assay.  Please interpret results in light of the total clinical presentation of the patient.    PSA     Status: None   Collection Time: 01/26/19  9:37 AM  Result Value Ref Range   PSA 3.04 0.10 - 4.00 ng/mL    Comment: Test performed using Access Hybritech PSA Assay, a parmagnetic partical, chemiluminecent immunoassay.  Testosterone     Status: None   Collection Time: 01/26/19  9:37 AM  Result Value Ref Range   Testosterone 413.18 300.00 - 890.00 ng/dL  TSH     Status: None   Collection Time: 01/26/19  9:37 AM  Result Value Ref Range   TSH 3.00 0.35 - 4.50 uIU/mL      Psychiatric Specialty Exam: Physical Exam  ROS  There were  no vitals taken for this visit.There is no height or weight on file to calculate BMI.  General Appearance: NA  Eye Contact:  NA  Speech:  Clear and Coherent and Normal Rate  Volume:  Normal  Mood:  Anxious  Affect:  NA  Thought Process:  Goal Directed  Orientation:  Full (Time, Place, and Person)  Thought Content:  WDL  Suicidal Thoughts:  No  Homicidal Thoughts:  No  Memory:  Immediate;   Good Recent;   Good Remote;   Good  Judgement:  Good  Insight:  Good  Psychomotor Activity:  NA  Concentration:  Concentration: Fair and Attention Span: Fair  Recall:  Good  Fund of Knowledge:  Good  Language:  Good  Akathisia:  No  Handed:  Right  AIMS (if indicated):     Assets:  Communication Skills Desire for Improvement Housing Resilience Social Support Talents/Skills  ADL's:  Intact  Cognition:  WNL  Sleep:   ok      Assessment and Plan: Bipolar disorder type I.  Generalized anxiety disorder.  I reviewed his blood work results.  He is taking Ambien 5 mg most of the time but there are nights when he has to take the full tablet of 10 mg.  Continue lorazepam 0.5 mg twice a day as needed for anxiety and Lamictal 300 mg daily.  He has no rash, itching, tremors or shakes.  Encouraged to continue therapy with [name].  Recommended to call us back if is any question or any concern.  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  Patient will follow-up with his primary care physician.  Follow-up in 3 months.  Follow Up Instructions:    I discussed the assessment and treatment plan with the patient. The patient was provided an opportunity to ask questions and all were answered. The patient agreed with the plan and demonstrated an understanding of the instructions.   The patient was advised to call back or seek an in-person evaluation if the symptoms worsen or if the condition fails to improve as anticipated.  I provided 20 minutes of non-face-to-face  time during this encounter.   Kathlee Nations, MD

## 2019-03-08 ENCOUNTER — Encounter (HOSPITAL_COMMUNITY): Payer: Self-pay | Admitting: Licensed Clinical Social Worker

## 2019-03-08 ENCOUNTER — Ambulatory Visit (INDEPENDENT_AMBULATORY_CARE_PROVIDER_SITE_OTHER): Payer: 59 | Admitting: Licensed Clinical Social Worker

## 2019-03-08 ENCOUNTER — Other Ambulatory Visit: Payer: Self-pay

## 2019-03-08 DIAGNOSIS — F319 Bipolar disorder, unspecified: Secondary | ICD-10-CM | POA: Diagnosis not present

## 2019-03-08 NOTE — Progress Notes (Signed)
Virtual Visit via Telephone Note  I connected with Alex [name] on 02/22/2019 at  4:00 PM EDT by phone as pt is uncomfortable at this time to use video enabled telemedicine application and verified that I am speaking with the correct person using two identifiers.   I discussed the limitations of evaluation and management by telemedicine and the availability of in person appointments. The patient expressed understanding and agreed to proceed.  History of Present Illness:  Pt was referred by Dr. Adele Schilder for OP therapy for bipolar disorder.    Observations/Objective: Pt presents anxious for his phone therapy session. Pt discussed his psychiatric symptoms and current life events. Pt reports his stress is high due to the continued dysfunction of his family. Provided psychoeducation on the concept of the dysfunctional family, based on a systems approach. Showed pt in the context of relationships with other family members. Coached pt on different relational disorders that take place within the family system and its subsystems  (children).   PLAN: Acceptance of future  Follow Up Instructions:  I discussed the assessment and treatment plan with the patient. The patient was provided an opportunity to ask questions and all were answered. The patient agreed with the plan and demonstrated an understanding of the instructions.   The patient was advised to call back or seek an in-person evaluation if the symptoms worsen or if the condition fails to improve as anticipated.  I provided 45 minutes of non-face-to-face time during this encounter.   MACKENZIE,LISBETH S, LCAS

## 2019-03-12 DIAGNOSIS — M79671 Pain in right foot: Secondary | ICD-10-CM | POA: Insufficient documentation

## 2019-03-16 ENCOUNTER — Ambulatory Visit (INDEPENDENT_AMBULATORY_CARE_PROVIDER_SITE_OTHER): Payer: 59 | Admitting: Internal Medicine

## 2019-03-16 ENCOUNTER — Other Ambulatory Visit (INDEPENDENT_AMBULATORY_CARE_PROVIDER_SITE_OTHER): Payer: 59

## 2019-03-16 ENCOUNTER — Encounter: Payer: Self-pay | Admitting: Internal Medicine

## 2019-03-16 ENCOUNTER — Other Ambulatory Visit: Payer: Self-pay

## 2019-03-16 VITALS — BP 148/92 | HR 85 | Temp 98.3°F | Resp 16 | Ht 71.0 in | Wt 175.0 lb

## 2019-03-16 DIAGNOSIS — R39198 Other difficulties with micturition: Secondary | ICD-10-CM

## 2019-03-16 DIAGNOSIS — R300 Dysuria: Secondary | ICD-10-CM

## 2019-03-16 DIAGNOSIS — N419 Inflammatory disease of prostate, unspecified: Secondary | ICD-10-CM

## 2019-03-16 DIAGNOSIS — N451 Epididymitis: Secondary | ICD-10-CM

## 2019-03-16 LAB — URINALYSIS
Bilirubin Urine: NEGATIVE
Hgb urine dipstick: NEGATIVE
Leukocytes,Ua: NEGATIVE
Nitrite: NEGATIVE
Specific Gravity, Urine: 1.025 (ref 1.000–1.030)
Total Protein, Urine: NEGATIVE
Urine Glucose: NEGATIVE
Urobilinogen, UA: 0.2 (ref 0.0–1.0)
pH: 6 (ref 5.0–8.0)

## 2019-03-16 MED ORDER — SULFAMETHOXAZOLE-TRIMETHOPRIM 800-160 MG PO TABS: 1.0000 | ORAL_TABLET | Freq: Two times a day (BID) | ORAL | 0 refills | Status: DC

## 2019-03-16 MED ORDER — TAMSULOSIN HCL 0.4 MG PO CAPS: 0.4000 mg | ORAL_CAPSULE | Freq: Every day | ORAL | 3 refills | Status: DC

## 2019-03-16 NOTE — Assessment & Plan Note (Signed)
Symptoms suggestive of epididymitis, which she has had in the past Will check UA, urine culture if he is able to give a sample Bactrim twice daily x2 weeks Call or return if no improvement

## 2019-03-16 NOTE — Assessment & Plan Note (Signed)
He is experiencing some difficulty urinating, which is not new-he has BPH and uses Flomax as needed Will renew now so we can use since he is having some difficulty urinating-likely related to infection Call if no improvement or see urology

## 2019-03-16 NOTE — Progress Notes (Signed)
Subjective:    Patient ID: Alex [name], male    DOB: 1956-03-31, 63 y.o.   MRN: [id number]  HPI The patient is here for an acute visit.   He woke up last week with a sharp stabbing pain in the bladder.  He has an extensive urological history and has had kidney stones, prostatitis and epididymitis.  He also has BPH.  He did go and see urology last week-1 week ago.  He had his urine checked and there is no evidence of infection.  There is no evidence of blood.  His DRE was unchanged and the urologist that he saw who is not his usual urologist thought maybe he was having referred pain and prescribed meloxicam.  There is been no improvement.  He has bladder discomfort and discomfort in his left suprapubic-testicle region.  He has discomfort in his perineum, which he describes as spikes of pain.  He is getting spastic bowel.  He has some discomfort with urination, but not at the tip of the bladder.  From his experience it feels more similar to the prostatitis or epididymitis he has had in the past.  He is concerned that maybe he has an infection.  He denies any fevers or chills.  He has not seen any blood in the urine.  He denies any back pain or flank pain.   Medications and allergies reviewed with patient and updated if appropriate.  Patient Active Problem List   Diagnosis Date Noted  . 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 Moderate persistent asthma with acute exacerbation 09/29/2018  . Prediabetes 09/29/2018  . RTI (respiratory tract infection) 09/29/2018  . Bruising 11/17/2017  . Testicular pain, right 08/01/2017  . Fatigue 01/21/2017  . Ankle pain, left 01/21/2017  . Fall (on) (from) other stairs and steps, initial encounter 11/11/2016  . Abrasion of head 11/11/2016  . Thoracic aortic aneurysm ([place]) 07/23/2016  . Erectile dysfunction 07/19/2016  . Bronchiectasis ([place]) 04/23/2016  . Chronic bronchitis ([place]) 03/11/2016  . Acute URI 11/03/2015  . Cerumen impaction  08/04/2015  . Cervical disc disorder with radiculopathy of cervical region 06/06/2014  . Right elbow pain 06/06/2014  . Elevated WBC count 06/06/2014  . Iron deficiency anemia 06/06/2014  . Pre-operative exam 04/07/2014  . Chronic right SI joint pain 02/21/2014  . Edema 02/21/2014  . Tinnitus 02/21/2014  . Well adult exam 05/31/2013  . Glaucoma 05/31/2013  . RML pneumonia ([place]) 03/31/2013  . Hypertension, uncontrolled 02/25/2013  . Ingrowing toenail with infection 04/02/2012  . Localized osteoarthritis of left knee 04/02/2012  . Vertigo 01/05/2012  . Ataxia 01/05/2012  . Anxiety state 08/08/2010  . Chest pain, atypical 02/08/2010  . ESOPHAGEAL STRICTURE 11/30/2009  . CHANGE IN BOWELS 11/30/2009  . Major depression, chronic 08/07/2009  . Osteoarthritis 08/07/2009  . GENERALIZED OSTEOARTHROSIS UNSPECIFIED SITE 10/20/2008  . Dyslipidemia 10/19/2008  . FOOT PAIN 07/11/2008  . Orchitis and epididymitis 05/24/2008  . RASH-NONVESICULAR 05/14/2008  . Cough 03/29/2008  . Coronary atherosclerosis 11/04/2007  . Lumbago 11/04/2007  . THYROIDECTOMY, HX OF 11/04/2007  . 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 NEOPLASM, MALIGNANT, THYROID GLAND 10/02/2007  . 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 IMPAIRED GLUCOSE TOLERANCE 10/02/2007    Current Outpatient Medications on File Prior to Visit  Medication Sig Dispense Refill  . acetaminophen (TYLENOL) 500 MG tablet Take 1,000 mg by mouth 2 (two) times daily as needed for headache.    [name] aspirin EC 81  MG tablet Take 1 tablet by mouth daily.    [name] atorvastatin (LIPITOR) 40 MG tablet TAKE 1 TABLET BY MOUTH EVERYDAY AT BEDTIME 90 tablet 3  . AXIRON 30 MG/ACT SOLN Place 2 Act onto the skin daily.    . budesonide-formoterol (SYMBICORT) 80-4.5 MCG/ACT inhaler Inhale 2 puffs into the lungs 2 (two) times daily. 3 Inhaler 1  . Coenzyme Q10 (CO Q 10 PO) Take 1 capsule by mouth daily.     . Immune  Globulin, Human, (HIZENTRA) 4 GM/20ML SOLN Inject into the skin once a week. wednesday    . LamoTRIgine 300 MG TB24 24 hour tablet Take 1 tablet (300 mg total) by mouth every morning. 90 tablet 0  . latanoprost (XALATAN) 0.005 % ophthalmic solution 1 DROP EACH EYE AT NIGHT    . levothyroxine (SYNTHROID) 150 MCG tablet TAKE 1 TABLET BY MOUTH EVERY DAY BEFORE BREAKFAST 90 tablet 3  . lidocaine-prilocaine (EMLA) cream Apply 1 application topically as needed.     [name] LORazepam (ATIVAN) 0.5 MG tablet Take 1 tab daily as needed and 1 tab at bed time 45 tablet 2  . LUTEIN PO Take 1 tablet by mouth daily.     [name] LYCOPENE PO Take by mouth.    . meloxicam (MOBIC) 15 MG tablet Take 1 tablet (15 mg total) by mouth daily as needed. Patient needs office visit before refills will be given 30 tablet 0  . Multiple Vitamin (MULTI-VITAMINS) TABS Take by mouth.    . OMEGA-3 KRILL OIL PO Take 1 capsule by mouth daily.     [name] omeprazole (PRILOSEC) 20 MG capsule Take 20 mg by mouth every evening.    . Saw Palmetto, Serenoa repens, (SAW PALMETTO PO) Take by mouth.    . sildenafil (REVATIO) 20 MG tablet Take 1-5 tablets (20-100 mg total) by mouth daily as needed. 30 tablet 11  . sildenafil (VIAGRA) 25 MG tablet Take 5 mg by mouth daily as needed (BPH).     [name] sucralfate (CARAFATE) 1 G tablet Take 1 g by mouth daily as needed (ACID REFLUX).   3  . tadalafil (ADCIRCA/CIALIS) 20 MG tablet TAKE 1 TABLET BY MOUTH ONCE DAILY AS NEEDED FOR ERECTILE DYSFUNCTION 30 tablet 1  . telmisartan (MICARDIS) 80 MG tablet TAKE 1 TABLET (80 MG TOTAL) BY MOUTH DAILY. 90 tablet 2  . zolpidem (AMBIEN) 10 MG tablet Take 1/2 tab at bed time and take other half if needed 30 tablet 2   No current facility-administered medications on file prior to visit.     Past Medical History:  Diagnosis Date  . Anemia   . Anxiety   . Bipolar I disorder ([place])   . Bleeding ulcer 2016  . BPH (benign prostatic hypertrophy) with urinary obstruction   . Cancer  [facility])    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  . Cataract    LEFT EYE  . Chronic back pain   . Complication of anesthesia POST URINARY RETENTION---  2006 SHOULDER SURGERY MARKED BRADYCARDIA VAGAL RESPONSE NO ISSUE W/ SURGERY AFTER THIS ONE   WITH GENERAL ANESTHESIA, 15 YRS AGO VASOVAGAL REACTION NONE SINCE  . 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 Coronary atherosclerosis CARDIOLOGIST- DR CRENSHAW--  LAST VISIT 01-05-2012 IN EPIC   NON-OBSTRUCTIVE MILD DISEASE  . 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[place])   . Depression   . Epicondylitis    right elbow  . GERD (gastroesophageal reflux disease)   . Glaucoma BOTH EYES  RIGHT EYE RADIATION DAMAGE  . Hearing loss    BOTH EARS  . Hearing loss    Bilateral  . Hepatic cyst    Several, The lesion of concern in segment 6 of the liver has single large portal vein and hepatic vein branches extending to tt, in a pattern of enhancement which mirrors these vascular structures. 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 These can be seen in normal patients and also on patient's with portal venous hypertension and in this case the lesion   . History of chronic prostatitis   . History of deviated nasal septum   . History of hiatal hernia    SMALL  . History of kidney stones   . 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 History of thyroid cancer PRIMARY (NO METS FROM ORBITAL CANCER)--   IN REMISSION   S/P TOTAL THYROIDECTOMY  , CHEMORADIATION  (ONCOLOGIST -- DR Griffith Citron)  . Hyperlipidemia   . Hypertension   . Macular degeneration    Left  . Nocturia   . OA (osteoarthritis)   . Pancreas cyst   . Peripheral vascular disease ([facility])    THORACIC AA 3. 9 CM X 4. 3 CM PER NOV 06-14-17  CHEST CTFOLOWED BY DR CRENSHAW YEARLY  FOR  . Positional vertigo    HX OF WITH SINUS INFECTIONS  . Radial head fracture    Right  . Thoracic aortic aneurysm ([place]) 06/14/2017   last CT 4.1 CM Mild  . Tinnitus    CONSTANT  . Ulnar nerve compression    right elbow  . Unsteady gait    especially with stairs, depth perception off  . Urinary hesitancy   . Wears glasses     Past Surgical History:  Procedure Laterality Date  . CARDIAC CATHETERIZATION  01-16-2006  DR Marcello Moores WALL   MILD CORONARY ATHEROSCLEROSIS/ MID TO DISTAL LAD 40% STENOSIS/ LVF 50-55%  . CARPAL TUNNEL RELEASE Right 11/03/2017   Procedure: RIGHT HAND CARPAL TUNNEL RELEASE;  Surgeon: Iran Planas, MD;  Location: [facility];  Service: Orthopedics;  Laterality: Right;  . CATARACT EXTRACTION Right   . COLONSCOPY  2017 LAST DONE   MULTIPLE  . ENDOSCOPY  LAST 2017   MULTIPLE DONE DILATION DONE ALSO  . ESOPHAGOGASTRODUODENOSCOPY (EGD) WITH PROPOFOL N/A 02/24/2018   Procedure: ESOPHAGOGASTRODUODENOSCOPY (EGD) WITH PROPOFOL;  Surgeon: Laurence Spates, MD;  Location: [facility];  Service: Endoscopy;  Laterality: N/A;  . EXCISION RADIAL HEAD Right 11/03/2017   Procedure: RIGHT PROXIMAL RADIUS RADIAL HEAD RESECTION AND JOINT DEBRIDEMENT;  Surgeon: Iran Planas, MD;  Location: [place];  Service: Orthopedics;  Laterality: Right;  . KNEE ARTHROSCOPY  05/01/2012   Procedure: ARTHROSCOPY KNEE;  Surgeon: Johnn Hai, MD;  Location: [facility];  Service: Orthopedics;  Laterality: Left;  debridement and removal of loose body  . LEFT ANKLE ARTHROSCOPY W/ DEBRIDEMENT  05-12-2007  . LEFT HYDROCELECTOMY  03-29-2005   AND REPAIR LEFT INGUINAL HERNIA W/ MESH  . MOHS SURGERY  2002   RIGHT ORBITAL CANCER  . NASAL ENDOSCOPY  08-07-2005   RIGHT EPISTAXIS  / POST SEPTOPLASTY  (HX RIGHT ORBITAL CA & S/P RADIATION/ NECROSIS ANTERIOR END OF BOTH INFERIOR TURBINATES)  . occuloplastic  surgery  2002  . PARS PLANA VITRECTOMY  11-06-2004    RIGHT EYE RADIATION RETINOPATHY W/ HEMORRHAGE  . RADIAL HEAD ARTHROPLASTY Right 06/15/2018   Procedure: RIGHT ELBOW PROXIMAL RADIOULNAR JOINT DEBRIDEMENT AND ARTHROPLASTY;  Surgeon: Iran Planas, MD;  Location: [place];  Service: Orthopedics;  Laterality: Right;  BLOCK WITH SEDATION  . REPAIR UNDESENDED RIGHT TESTICLE / RIGHT INGUINAL HERNIA  AGE 5  . RIGHT ANKLE ARTHROSCOPY W/ EXTENSIVE DEBRIDEMENT  04/05/2008   x2  . RIGHT SHOULDER SURGERY  2006  . RIGHT SUPRAOMOHYOID NECK DISSECTION   03-08-2003   ZONES 1,2,3;   SUBMANDIBULAR MASS / METASTATIC SQUAMOUS CELL CARCINOMA RIGHT NECK  . SAVORY DILATION N/A 02/24/2018   Procedure: SAVORY DILATION;  Surgeon: Laurence Spates, MD;  Location: [facility];  Service: Endoscopy;  Laterality: N/A;  . SEPTOPLASTY  NOV 2006  . SHOULDER ARTHROSCOPY Left   . SHOULDER ARTHROSCOPY W/ SUBACROMIAL DECOMPRESSION AND DISTAL CLAVICLE EXCISION  10-09-2008   AND DEBRIDEMENT OF RIGHT SHOULDER IMPINGEMENT & [name] JOINT ARTHRITIS  . SPINE SURGERY  2016   l 3 TO l 4 PLATE AND SCREWS  . TOTAL THYROIDECTOMY  11-03-2001   PAPILLARY THYROID CARCINOMA  . TRANSTHORACIC ECHOCARDIOGRAM  12/ 2012   grade I diastolic dysfunction/ ef 42-70%  . ULNAR NERVE TRANSPOSITION Right 04/28/2014   Procedure: RIGHT ELBOW ULNA NERVE RELEASE TRANSPOSTION AND MEDIAL EPICONDYLAR DEBRIDEMENT AND REPAIR;  Surgeon: Linna Hoff, MD;  Location: [place];  Service: Orthopedics;  Laterality: Right;    Social History   Socioeconomic History  . Marital status: Married    Spouse name: Not on file  . Number of children: Not on file  . Years of education: Not on file  . Highest education level: Not on file  Occupational History  . Not on file  Social Needs  . Financial resource strain: Not on file  . Food insecurity    Worry: Not on file    Inability: Not on file  . Transportation needs    Medical: Not on file    Non-medical: Not on file  Tobacco Use   . Smoking status: Never Smoker  . Smokeless tobacco: Never Used  Substance and Sexual Activity  . Alcohol use: Yes    Alcohol/week: 0.0 standard drinks    Comment: 1-2 glasses per month  . Drug use: No  . Sexual activity: Yes    Partners: Female    Birth control/protection: None  Lifestyle  . Physical activity    Days per week: Not on file    Minutes per session: Not on file  . Stress: Not on file  Relationships  . Social Herbalist on phone: Not on file    Gets together: Not on file    Attends religious service: Not on file    Active member of club or organization: Not on file    Attends meetings of clubs or organizations: Not on file    Relationship status: Not on file  Other Topics Concern  . Not on file  Social History Narrative   Originally from [place]. Moved to [place] in 1984. 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 No international travel other than [place]. Has a dog at home, poodle mix. His daughter who lives with him now has a dog. No mold exposure. No bird exposure.  No hot tub exposure. Enjoys gardening.     Family History  Problem Relation Age of Onset  . Depression Sister   . Rectal cancer Sister   . Lung cancer Brother   . Kidney disease Mother   . Depression Mother   . Stroke Father   . COPD Father   . Hypertension Father   . Prostate cancer Father   . Depression Daughter   . Drug abuse Daughter   . Psychiatric Illness Son     Review of Systems  Constitutional: Negative for chills and fever.  Gastrointestinal: Positive for abdominal pain (Suprapubic discomfort).  Genitourinary: Positive for difficulty urinating, dysuria (not at tip of penis), frequency (small amounts) and testicular pain (Left side-into suprapubic region on left). Negative for flank pain and hematuria.  Musculoskeletal: Negative for back pain.        Objective:   Vitals:   03/16/19 1437  BP: (!) 148/92  Pulse: 85  Resp: 16  Temp: 98.3 F (36.8 C)  SpO2: 98%   BP Readings from Last 3 Encounters:  03/16/19 (!) 148/92  01/26/19 134/86  09/29/18 (!) 142/96   Wt Readings from Last 3 Encounters:  03/16/19 175 lb (79.4 kg)  01/26/19 176 lb (79.8 kg)  09/29/18 182 lb 12 oz (82.9 kg)   Body mass index is 24.41 kg/m.   Physical Exam Constitutional:      General: He is not in acute distress.    Appearance: Normal appearance. He is not ill-appearing.  Abdominal:     Tenderness: There is abdominal tenderness (Suprapubic region). There is no right CVA tenderness, left CVA tenderness, guarding or rebound.  Genitourinary:    Comments: Some discomfort left scrotum-suprapubic region.  Prostate exam deferred-none by urology last week Skin:    General: Skin is warm and dry.  Neurological:     Mental Status: He is alert.            Assessment & Plan:    See Problem List for Assessment and Plan of chronic medical problems.

## 2019-03-16 NOTE — Patient Instructions (Addendum)
Take the Bactrim for the two weeks as prescribed.  Restart flomax.     Call or return if there is no improvement.

## 2019-03-18 ENCOUNTER — Encounter: Payer: Self-pay | Admitting: Internal Medicine

## 2019-03-18 LAB — URINE CULTURE
MICRO NUMBER:: 734914
Result:: NO GROWTH
SPECIMEN QUALITY:: ADEQUATE

## 2019-03-29 ENCOUNTER — Ambulatory Visit (INDEPENDENT_AMBULATORY_CARE_PROVIDER_SITE_OTHER): Payer: 59 | Admitting: Licensed Clinical Social Worker

## 2019-03-29 ENCOUNTER — Encounter (HOSPITAL_COMMUNITY): Payer: Self-pay | Admitting: Licensed Clinical Social Worker

## 2019-03-29 ENCOUNTER — Other Ambulatory Visit: Payer: Self-pay

## 2019-03-29 ENCOUNTER — Ambulatory Visit: Payer: No Typology Code available for payment source | Admitting: Adult Health

## 2019-03-29 DIAGNOSIS — F319 Bipolar disorder, unspecified: Secondary | ICD-10-CM | POA: Diagnosis not present

## 2019-03-29 NOTE — Progress Notes (Addendum)
Virtual Visit via Telephone Note  I connected with Alex [name] on 03/29/2019 at  4:00 PM EDT by phone as pt is uncomfortable at this time to use video enabled telemedicine application and verified that I am speaking with the correct person using two identifiers.   I discussed the limitations of evaluation and management by telemedicine and the availability of in person appointments. The patient expressed understanding and agreed to proceed.  History of Present Illness:  Pt was referred by Dr. Adele Schilder for OP therapy for bipolar disorder.    Observations/Objective: Pt presents anxious for his phone therapy session. Pt discussed his psychiatric symptoms and current life events. Pt reports he is not as anxious because "I'm just coming back from a week's vacation." Two of his children accompanied them and there was limited drama. Discussed the importance of his self care. Continued discussion on his continued family dysfunction. Continue to educate pt on different relational disorders that take place within the family system and its subsystems  (children). Discussed acceptance with patient. Acceptance of enabling his children and the consequences of the enabling.   PLAN: Acceptance of future, definition of personal happiness  Follow Up Instructions:  I discussed the assessment and treatment plan with the patient. The patient was provided an opportunity to ask questions and all were answered. The patient agreed with the plan and demonstrated an understanding of the instructions.   The patient was advised to call back or seek an in-person evaluation if the symptoms worsen or if the condition fails to improve as anticipated.  I provided 60 minutes of non-face-to-face time during this encounter.   Alex [name] S, LCAS

## 2019-04-07 ENCOUNTER — Other Ambulatory Visit (HOSPITAL_COMMUNITY): Payer: Self-pay

## 2019-04-07 DIAGNOSIS — F411 Generalized anxiety disorder: Secondary | ICD-10-CM

## 2019-04-07 MED ORDER — ZOLPIDEM TARTRATE 10 MG PO TABS: ORAL_TABLET | ORAL | 2 refills | Status: DC

## 2019-04-12 ENCOUNTER — Other Ambulatory Visit: Payer: Self-pay

## 2019-04-12 ENCOUNTER — Ambulatory Visit (HOSPITAL_COMMUNITY): Payer: 59 | Admitting: Licensed Clinical Social Worker

## 2019-04-26 ENCOUNTER — Encounter (INDEPENDENT_AMBULATORY_CARE_PROVIDER_SITE_OTHER): Payer: Managed Care, Other (non HMO) | Admitting: Ophthalmology

## 2019-04-26 ENCOUNTER — Other Ambulatory Visit: Payer: Self-pay

## 2019-04-26 DIAGNOSIS — H35033 Hypertensive retinopathy, bilateral: Secondary | ICD-10-CM | POA: Diagnosis not present

## 2019-04-26 DIAGNOSIS — H3521 Other non-diabetic proliferative retinopathy, right eye: Secondary | ICD-10-CM

## 2019-04-26 DIAGNOSIS — I10 Essential (primary) hypertension: Secondary | ICD-10-CM

## 2019-04-26 DIAGNOSIS — H43813 Vitreous degeneration, bilateral: Secondary | ICD-10-CM | POA: Diagnosis not present

## 2019-04-29 ENCOUNTER — Telehealth (HOSPITAL_COMMUNITY): Payer: Self-pay | Admitting: Licensed Clinical Social Worker

## 2019-04-29 NOTE — Telephone Encounter (Signed)
Left vm msg asking pt to call the front desk to make new therapy appointments.  Alver Fisher, MS, LCAS

## 2019-05-06 ENCOUNTER — Other Ambulatory Visit: Payer: Self-pay

## 2019-05-06 ENCOUNTER — Ambulatory Visit (INDEPENDENT_AMBULATORY_CARE_PROVIDER_SITE_OTHER): Payer: 59 | Admitting: Licensed Clinical Social Worker

## 2019-05-06 DIAGNOSIS — F3162 Bipolar disorder, current episode mixed, moderate: Secondary | ICD-10-CM

## 2019-05-10 ENCOUNTER — Other Ambulatory Visit: Payer: Self-pay

## 2019-05-10 ENCOUNTER — Encounter (HOSPITAL_COMMUNITY): Payer: Self-pay | Admitting: Licensed Clinical Social Worker

## 2019-05-10 ENCOUNTER — Ambulatory Visit (INDEPENDENT_AMBULATORY_CARE_PROVIDER_SITE_OTHER): Payer: 59 | Admitting: Licensed Clinical Social Worker

## 2019-05-10 DIAGNOSIS — F3162 Bipolar disorder, current episode mixed, moderate: Secondary | ICD-10-CM | POA: Diagnosis not present

## 2019-05-10 NOTE — Progress Notes (Signed)
Virtual Visit via Telephone Note  I connected with Alex [name] on 05/10/2019 at  2:00 PM EDT by phone as pt is uncomfortable to use video enabled telemedicine application and verified that I am speaking with the correct person using two identifiers.   I discussed the limitations of evaluation and management by telemedicine and the availability of in person appointments. The patient expressed understanding and agreed to proceed.  History of Present Illness:  Pt was referred by Dr. Adele Schilder for OP therapy for bipolar disorder.    Observations/Objective: Pt presents anxious and depressed for his phone therapy session. Pt discussed his psychiatric symptoms and current life events. Continued session from last week. Patient continues to stress about son who is in the hospital with a sitter, waiting for a psychiatric bed, with medical capabilities (cholostomy bag due to self puncture). Discussed options and choices. Educated pt on co-dependency.     PLAN: Acceptance of future, definition of personal happiness  Follow Up Instructions:  I discussed the assessment and treatment plan with the patient. The patient was provided an opportunity to ask questions and all were answered. The patient agreed with the plan and demonstrated an understanding of the instructions.   The patient was advised to call back or seek an in-person evaluation if the symptoms worsen or if the condition fails to improve as anticipated.  I provided 60 minutes of non-face-to-face time during this encounter.   Alex [name], LCAS

## 2019-05-10 NOTE — Progress Notes (Signed)
Virtual Visit via Telephone Note  I connected with Alex [name] on 05/06/2019 at  4:00 PM EDT by phone as pt is uncomfortable to use video enabled telemedicine application and verified that I am speaking with the correct person using two identifiers.   I discussed the limitations of evaluation and management by telemedicine and the availability of in person appointments. The patient expressed understanding and agreed to proceed.  History of Present Illness:  Pt was referred by Dr. Adele Schilder for OP therapy for bipolar disorder.    Observations/Objective: Pt presents anxious and depressed for his phone therapy session. Pt discussed his psychiatric symptoms and current life events. The session was interrupted several times due to patient having activity at his house (dog groomer, family interrupting), with limited continuity during the session. Pt's biggest stressor currently is his son who has been in the hospital for 3 weeks due to self injury. Pt discussed the self injury, it's affects on his life and his family's life. Assisted pt with the importance of self care during this stressful time. Suggested mindfulness skills.  PLAN: Acceptance of future, definition of personal happiness  Follow Up Instructions:  I discussed the assessment and treatment plan with the patient. The patient was provided an opportunity to ask questions and all were answered. The patient agreed with the plan and demonstrated an understanding of the instructions.   The patient was advised to call back or seek an in-person evaluation if the symptoms worsen or if the condition fails to improve as anticipated.  I provided 60 minutes of non-face-to-face time during this encounter.   Jomes Giraldo S, LCAS

## 2019-05-17 ENCOUNTER — Other Ambulatory Visit: Payer: Self-pay

## 2019-05-17 ENCOUNTER — Ambulatory Visit (INDEPENDENT_AMBULATORY_CARE_PROVIDER_SITE_OTHER): Payer: 59 | Admitting: Licensed Clinical Social Worker

## 2019-05-17 DIAGNOSIS — F3162 Bipolar disorder, current episode mixed, moderate: Secondary | ICD-10-CM

## 2019-05-18 ENCOUNTER — Encounter (HOSPITAL_COMMUNITY): Payer: Self-pay | Admitting: Licensed Clinical Social Worker

## 2019-05-18 NOTE — Progress Notes (Signed)
Virtual Visit via Telephone Note  I connected with Alex [name] on 05/17/2019 at  4:00 PM EDT by phone as pt is uncomfortable to use video enabled telemedicine application and verified that I am speaking with the correct person using two identifiers.   I discussed the limitations of evaluation and management by telemedicine and the availability of in person appointments. The patient expressed understanding and agreed to proceed.  History of Present Illness:  Pt was referred by Dr. Adele [name] for OP therapy for bipolar disorder.    Observations/Objective: Pt presents anxious and depressed for his phone therapy session. Pt discussed his psychiatric symptoms and current life events. Continued session from last week. Patient continues to stress about son who is in the hospital with a sitter, waiting for a psychiatric bed, with medical issues (cholostomy bag due to self puncture). Discussed options and choices. Once again, discussed acceptance. Patient will be the caretaker of his son for the duration of his life. Patient shared his feelings about this realization. Patient has begun feeling anger towards his son and his continued self harm. Assisted patient with his feelings - is it anger or what else is it? Discussed future caretaking plans  - legally.    PLAN: Acceptance of future, definition of personal happiness  Follow Up Instructions:  I discussed the assessment and treatment plan with the patient. The patient was provided an opportunity to ask questions and all were answered. The patient agreed with the plan and demonstrated an understanding of the instructions.   The patient was advised to call back or seek an in-person evaluation if the symptoms worsen or if the condition fails to improve as anticipated.  I provided 45 minutes of non-face-to-face time during this encounter.   Alex [name] S, LCAS

## 2019-05-27 ENCOUNTER — Other Ambulatory Visit: Payer: Self-pay | Admitting: Internal Medicine

## 2019-05-31 ENCOUNTER — Other Ambulatory Visit: Payer: Self-pay

## 2019-05-31 ENCOUNTER — Other Ambulatory Visit: Payer: Self-pay | Admitting: Cardiology

## 2019-05-31 ENCOUNTER — Encounter (HOSPITAL_COMMUNITY): Payer: Self-pay | Admitting: Psychiatry

## 2019-05-31 ENCOUNTER — Ambulatory Visit (INDEPENDENT_AMBULATORY_CARE_PROVIDER_SITE_OTHER): Payer: 59 | Admitting: Psychiatry

## 2019-05-31 DIAGNOSIS — F319 Bipolar disorder, unspecified: Secondary | ICD-10-CM

## 2019-05-31 DIAGNOSIS — F411 Generalized anxiety disorder: Secondary | ICD-10-CM | POA: Diagnosis not present

## 2019-05-31 MED ORDER — LAMOTRIGINE ER 300 MG PO TB24: 1.0000 | ORAL_TABLET | ORAL | 0 refills | Status: DC

## 2019-05-31 MED ORDER — LORAZEPAM 0.5 MG PO TABS: ORAL_TABLET | ORAL | 2 refills | Status: DC

## 2019-05-31 MED ORDER — ZOLPIDEM TARTRATE 10 MG PO TABS: ORAL_TABLET | ORAL | 1 refills | Status: DC

## 2019-05-31 NOTE — Progress Notes (Signed)
Virtual Visit via Telephone Note  I connected with Alex [name] on 05/31/19 at  2:00 PM EDT by telephone and verified that I am speaking with the correct person using two identifiers.   I discussed the limitations, risks, security and privacy concerns of performing an evaluation and management service by telephone and the availability of in person appointments. I also discussed with the patient that there may be a patient responsible charge related to this service. The patient expressed understanding and agreed to proceed.   History of Present Illness: Patient was evaluated by phone session.  He is stressed out about his son [name] who is in the hospital at home for 5 weeks and recently approved to have a bed [facility].  Patient told that his son 5 weeks ago fell from the stairs and he was gasping for air and albumin believe that he is dying.  They have to call 911 and EMS stabilize him and find out that he again ingested hanger which may have perforated his heart.  He has a emergency surgery and clinically he is stable.  Patient is very concerned about his son's health but also realize that it is a chronic issue and his son is not get better.  He admitted most of this stress is situational dealing with the daughter and the son.  He is seeing a therapist on a regular basis.  He is sleeping most of the time okay with the Ambien and lorazepam but there are days and nights when he feels the medicine not working.  He also taking Lamictal which is helping his mood, anger and irritability.  He does not want to change medication since he had tried other medicine in the past and that did not work.  He denies any mania, suicidal thoughts or homicidal thoughts.  He has no rash or any itching.  His energy level is fair.  His appetite is okay.  Denies drinking or using any illegal substances.  Past Psychiatric History:Reviewed. [facility]  due to suicidal thoughts butno attempt. No h/o psychosis buth/o poor impulse control. In the past tookSeroquel,Cymbaltaand Remeron (increase depression)    Psychiatric Specialty Exam: Physical Exam  ROS  There were no vitals taken for this visit.There is no height or weight on file to calculate BMI.  General Appearance: NA  Eye Contact:  NA  Speech:  Clear and Coherent and Slow  Volume:  Normal  Mood:  Anxious and Dysphoric  Affect:  NA  Thought Process:  Goal Directed  Orientation:  Full (Time, Place, and Person)  Thought Content:  Rumination  Suicidal Thoughts:  No  Homicidal Thoughts:  No  Memory:  Immediate;   Good Recent;   Good Remote;   Good  Judgement:  Good  Insight:  Fair  Psychomotor Activity:  NA  Concentration:  Concentration: Fair and Attention Span: Fair  Recall:  Good  Fund of Knowledge:  Good  Language:  Good  Akathisia:  No  Handed:  Right  AIMS (if indicated):     Assets:  Communication Skills Desire for Improvement Housing Resilience  ADL's:  Intact  Cognition:  WNL  Sleep:   fair      Assessment and Plan: Generalized anxiety disorder.  Bipolar disorder type I.  Discuss his situational anxiety and family stress.  He realized it is a chronic issue and he has to deal with the current situation.  He does not want to change medication.  Continue lorazepam 0.5  mg daily and second if needed.  Continue Ambien 10 mg half tablet every night and extra half if needed.  Continue Lamictal 300 mg daily.  He has no rash or itching or tremors.  Encouraged to continue therapy with [name] for counseling and coping skills.  Recommended to call us back if is any question or any concern.  Follow-up in 3 months.  Follow Up Instructions:    I discussed the assessment and treatment plan with the patient. The patient was provided an opportunity to ask questions and all were answered. The patient agreed with the plan and demonstrated an understanding of the  instructions.   The patient was advised to call back or seek an in-person evaluation if the symptoms worsen or if the condition fails to improve as anticipated.  I provided 20 minutes of non-face-to-face time during this encounter.   Kathlee Nations, MD

## 2019-06-01 ENCOUNTER — Other Ambulatory Visit: Payer: Self-pay

## 2019-06-01 ENCOUNTER — Encounter (HOSPITAL_COMMUNITY): Payer: Self-pay | Admitting: Licensed Clinical Social Worker

## 2019-06-01 ENCOUNTER — Ambulatory Visit (INDEPENDENT_AMBULATORY_CARE_PROVIDER_SITE_OTHER): Payer: 59 | Admitting: Licensed Clinical Social Worker

## 2019-06-01 DIAGNOSIS — F319 Bipolar disorder, unspecified: Secondary | ICD-10-CM

## 2019-06-01 DIAGNOSIS — F411 Generalized anxiety disorder: Secondary | ICD-10-CM

## 2019-06-01 LAB — HEPATIC FUNCTION PANEL
ALT: 35 IU/L (ref 0–44)
AST: 34 IU/L (ref 0–40)
Albumin: 4.5 g/dL (ref 3.8–4.8)
Alkaline Phosphatase: 78 IU/L (ref 39–117)
Bilirubin Total: 0.7 mg/dL (ref 0.0–1.2)
Bilirubin, Direct: 0.24 mg/dL (ref 0.00–0.40)
Total Protein: 6.8 g/dL (ref 6.0–8.5)

## 2019-06-01 LAB — LIPID PANEL
Chol/HDL Ratio: 2.2 ratio (ref 0.0–5.0)
Cholesterol, Total: 163 mg/dL (ref 100–199)
HDL: 75 mg/dL (ref >39)
LDL Chol Calc (NIH): 73 mg/dL (ref 0–99)
Triglycerides: 83 mg/dL (ref 0–149)
VLDL Cholesterol Cal: 15 mg/dL (ref 5–40)

## 2019-06-01 NOTE — Progress Notes (Signed)
Virtual Visit via Telephone Note  I connected with Darrol Babin Geck on 06/01/2019 at  4:00 PM EDT by phone as pt is uncomfortable to use video enabled telemedicine application and verified that I am speaking with the correct person using two identifiers.   I discussed the limitations of evaluation and management by telemedicine and the availability of in person appointments. The patient expressed understanding and agreed to proceed.  History of Present Illness:  Pt was referred by Dr. Adele Schilder for OP therapy for bipolar disorder.    Observations/Objective: Pt presents anxious and depressed for his phone therapy session. Pt discussed his psychiatric symptoms and current life events. Continued session from last week. Patient continues to stress about son who is was discharged from the hospital and sent to [facility]. Patient continues to use words " I have situational anxiety and depression."  Continue to discuss with patient about acceptance of the choices of his son and how it affects his life, his family dynamics and his physical and mental health.  PLAN: Acceptance of future  Follow Up Instructions:  I discussed the assessment and treatment plan with the patient. The patient was provided an opportunity to ask questions and all were answered. The patient agreed with the plan and demonstrated an understanding of the instructions.   The patient was advised to call back or seek an in-person evaluation if the symptoms worsen or if the condition fails to improve as anticipated.  I provided 45 minutes of non-face-to-face time during this encounter.   MACKENZIE,LISBETH S, LCAS

## 2019-06-03 ENCOUNTER — Encounter: Payer: Self-pay | Admitting: Cardiology

## 2019-06-03 ENCOUNTER — Ambulatory Visit: Payer: No Typology Code available for payment source | Admitting: Cardiology

## 2019-06-03 ENCOUNTER — Other Ambulatory Visit: Payer: Self-pay

## 2019-06-03 ENCOUNTER — Telehealth: Payer: Self-pay | Admitting: Cardiology

## 2019-06-03 VITALS — BP 124/74 | HR 77 | Ht 72.0 in | Wt 179.0 lb

## 2019-06-03 DIAGNOSIS — I712 Thoracic aortic aneurysm, without rupture: Secondary | ICD-10-CM

## 2019-06-03 DIAGNOSIS — I2510 Atherosclerotic heart disease of native coronary artery without angina pectoris: Secondary | ICD-10-CM | POA: Diagnosis not present

## 2019-06-03 DIAGNOSIS — Z85850 Personal history of malignant neoplasm of thyroid: Secondary | ICD-10-CM | POA: Diagnosis not present

## 2019-06-03 DIAGNOSIS — I10 Essential (primary) hypertension: Secondary | ICD-10-CM

## 2019-06-03 DIAGNOSIS — D839 Common variable immunodeficiency, unspecified: Secondary | ICD-10-CM

## 2019-06-03 DIAGNOSIS — R932 Abnormal findings on diagnostic imaging of liver and biliary tract: Secondary | ICD-10-CM

## 2019-06-03 DIAGNOSIS — I7121 Aneurysm of the ascending aorta, without rupture: Secondary | ICD-10-CM

## 2019-06-03 NOTE — Patient Instructions (Signed)
Medication Instructions:  Your physician recommends that you continue on your current medications as directed. Please refer to the Current Medication list given to you today. *If you need a refill on your cardiac medications before your next appointment, please call your pharmacy*  Lab Work: None  If you have labs (blood work) drawn today and your tests are completely normal, you will receive your results only by: . MyChart Message (if you have MyChart) OR . A paper copy in the mail If you have any lab test that is abnormal or we need to change your treatment, we will call you to review the results.  Testing/Procedures: None   Follow-Up: At [facility], you and your health needs are our priority.  As part of our continuing mission to provide you with exceptional heart care, we have created designated Provider Care Teams.  These Care Teams include your primary Cardiologist (physician) and Advanced Practice Providers (APPs -  Physician Assistants and Nurse Practitioners) who all work together to provide you with the care you need, when you need it.  Your next appointment:   12 month(s)  The format for your next appointment:   In Person  Provider:   Brian Crenshaw, MD  Other Instructions  

## 2019-06-03 NOTE — Assessment & Plan Note (Signed)
Dr Magrinat follows 

## 2019-06-03 NOTE — Assessment & Plan Note (Signed)
Chronic: on IV IgG 

## 2019-06-03 NOTE — Telephone Encounter (Signed)
Mr Fuson wanted to know if his annual ascending aortic aneurysm MRA could be done at the same time as his Liver MRA scheduled for next month.  I called Dr Doylene Canard office and left a phone message with his RN inquiring about this possibility.  Kerin Ransom PA-C 06/03/2019 4:44 PM

## 2019-06-03 NOTE — Assessment & Plan Note (Signed)
4.1 cm Ascending AO aneurysm- yearly MRA or CT

## 2019-06-03 NOTE — Progress Notes (Signed)
Cardiology Office Note:    Date:  06/03/2019   ID:  Alex [name], DOB May 04, 1956, MRN [id number]  PCP:  Cassandria Anger, MD  Cardiologist:  [name], MD  Electrophysiologist:  None   Referring MD: Cassandria Anger, MD   No chief complaint on file. One year follow up  History of Present Illness:    Alex [name] is a 63 y.o. male with a hx of minor coronary disease at catheterization in 2007.  He also has an ascending aortic aneurysm.  This is followed by annual imaging.  When he was imaged in 2019 there was an incidental finding of a liver abnormality.  The patient went to [facility] for further evaluation.  He was seen by Dr. Merrilee Jansky.  Imaging there suggested a right portal vein shunt and a pancreatic cyst.  The patient is due for his annual aneurysm imaging as well as his annual liver imaging and wanted to know if these could be combined into 1 study when he goes for his Liver imaging at [facility].  I have reached out to Dr. Doylene Canard office and will forward this note as well.  From a cardiac standpoint the patient is done well, he denies any chest pain or unusual dyspnea.  He has other medical issues included common immune deficiency disorder and history of thyroid cancer with metastasis.  He is been very careful during the current [place] V[place] pandemic as he knows he is at high risk.  He is on aspirin and statin therapy for his vascular disease.  Past Medical History:  Diagnosis Date  . Anemia   . Anxiety   . Bipolar I disorder ([place])   . Bleeding ulcer 2016  . BPH (benign prostatic hypertrophy) with urinary obstruction   . 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 Cataract    LEFT EYE  . Chronic back pain   . Complication of anesthesia POST URINARY RETENTION---  2006 SHOULDER SURGERY MARKED BRADYCARDIA VAGAL RESPONSE NO ISSUE W/ SURGERY AFTER THIS ONE   WITH GENERAL ANESTHESIA, 15 YRS AGO VASOVAGAL REACTION NONE SINCE  . 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 Coronary atherosclerosis CARDIOLOGIST- DR CRENSHAW--  LAST VISIT 01-05-2012 IN EPIC   NON-OBSTRUCTIVE MILD DISEASE  . 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[place])   . Depression   . Epicondylitis    right elbow  . GERD (gastroesophageal reflux disease)   . Glaucoma BOTH EYES   RIGHT EYE RADIATION DAMAGE  . Hearing loss    BOTH EARS  . Hearing loss    Bilateral  . Hepatic cyst    Several, The lesion of concern in segment 6 of the liver has single large portal vein and hepatic vein branches extending to tt, in a pattern of enhancement which mirrors these vascular structures. 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 These can be seen in normal patients and also on patient's with portal venous hypertension and in this case the lesion   . History of chronic prostatitis   . History of deviated nasal septum   . History of hiatal hernia    SMALL  . History of kidney stones   . 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 History of thyroid cancer PRIMARY (NO METS FROM ORBITAL CANCER)--   IN REMISSION   S/P TOTAL THYROIDECTOMY  , CHEMORADIATION  (ONCOLOGIST -- DR Griffith Citron)  . Hyperlipidemia   . Hypertension   . Macular degeneration    Left  . Nocturia   . OA (osteoarthritis)   . Pancreas cyst   . Peripheral vascular disease (HCC)    THORACIC AA 3. 9 CM X 4. 3 CM PER NOV 06-14-17  CHEST CTFOLOWED BY DR CRENSHAW YEARLY FOR  . Positional vertigo    HX OF WITH SINUS INFECTIONS  . Radial head fracture    Right  . Thoracic aortic aneurysm ([place]) 06/14/2017   last CT 4.1 CM Mild  . Tinnitus    CONSTANT  . Ulnar nerve compression    right elbow  . Unsteady gait    especially with stairs, depth perception off  . Urinary hesitancy   . Wears glasses     Past Surgical  History:  Procedure Laterality Date  . CARDIAC CATHETERIZATION  01-16-2006  DR Marcello Moores WALL   MILD CORONARY ATHEROSCLEROSIS/ MID TO DISTAL LAD 40% STENOSIS/ LVF 50-55%  . CARPAL TUNNEL RELEASE Right 11/03/2017   Procedure: RIGHT HAND CARPAL TUNNEL RELEASE;  Surgeon: Iran Planas, MD;  Location: [facility];  Service: Orthopedics;  Laterality: Right;  . CATARACT EXTRACTION Right   . COLONSCOPY  2017 LAST DONE   MULTIPLE  . ENDOSCOPY  LAST 2017   MULTIPLE DONE DILATION DONE ALSO  . ESOPHAGOGASTRODUODENOSCOPY (EGD) WITH PROPOFOL N/A 02/24/2018   Procedure: ESOPHAGOGASTRODUODENOSCOPY (EGD) WITH PROPOFOL;  Surgeon: Laurence Spates, MD;  Location[facility];  Service: Endoscopy;  Laterality: N/A;  . EXCISION RADIAL HEAD Right 11/03/2017   Procedure: RIGHT PROXIMAL RADIUS RADIAL HEAD RESECTION AND JOINT DEBRIDEMENT;  Surgeon: Iran Planas, MD;  Location: [place];  Service: Orthopedics;  Laterality: Right;  . KNEE ARTHROSCOPY  05/01/2012   Procedure: ARTHROSCOPY KNEE;  Surgeon: Johnn Hai, MD;  Location: [facility];  Service: Orthopedics;  Laterality: Left;  debridement and removal of loose body  . LEFT ANKLE ARTHROSCOPY W/ DEBRIDEMENT  05-12-2007  . LEFT HYDROCELECTOMY  03-29-2005   AND REPAIR LEFT INGUINAL HERNIA W/ MESH  . MOHS SURGERY  2002   RIGHT ORBITAL CANCER  . NASAL ENDOSCOPY  08-07-2005   RIGHT EPISTAXIS  / POST SEPTOPLASTY  (HX RIGHT ORBITAL CA & S/P RADIATION/ NECROSIS ANTERIOR END OF BOTH INFERIOR TURBINATES)  . occuloplastic surgery  2002  . PARS PLANA VITRECTOMY  11-06-2004   RIGHT EYE RADIATION RETINOPATHY W/ HEMORRHAGE  . RADIAL HEAD ARTHROPLASTY Right 06/15/2018   Procedure: RIGHT ELBOW PROXIMAL RADIOULNAR JOINT DEBRIDEMENT AND ARTHROPLASTY;  Surgeon: Iran Planas, MD;  Location: [place];  Service: Orthopedics;  Laterality: Right;  BLOCK WITH SEDATION  . REPAIR UNDESENDED RIGHT TESTICLE / RIGHT INGUINAL  HERNIA  AGE 49  . RIGHT ANKLE ARTHROSCOPY W/ EXTENSIVE DEBRIDEMENT  04/05/2008   x2  . RIGHT SHOULDER SURGERY  2006  . RIGHT SUPRAOMOHYOID NECK DISSECTION   03-08-2003   ZONES 1,2,3;   SUBMANDIBULAR MASS / METASTATIC SQUAMOUS CELL CARCINOMA RIGHT NECK  . SAVORY DILATION N/A 02/24/2018   Procedure: SAVORY DILATION;  Surgeon: Laurence Spates, MD;  Location: [facility];  Service: Endoscopy;  Laterality: N/A;  . SEPTOPLASTY  NOV 2006  . SHOULDER ARTHROSCOPY Left   . SHOULDER ARTHROSCOPY W/ SUBACROMIAL DECOMPRESSION AND DISTAL CLAVICLE EXCISION  10-09-2008   AND DEBRIDEMENT OF  RIGHT SHOULDER IMPINGEMENT & AC JOINT ARTHRITIS  . SPINE SURGERY  2016   l 3 TO l 4 PLATE AND SCREWS  . TOTAL THYROIDECTOMY  11-03-2001   PAPILLARY THYROID CARCINOMA  . TRANSTHORACIC ECHOCARDIOGRAM  12/ 2012   grade I diastolic dysfunction/ ef 0000000  . ULNAR NERVE TRANSPOSITION Right 04/28/2014   Procedure: RIGHT ELBOW ULNA NERVE RELEASE TRANSPOSTION AND MEDIAL EPICONDYLAR DEBRIDEMENT AND REPAIR;  Surgeon: Linna Hoff, MD;  Location: [place];  Service: Orthopedics;  Laterality: Right;    Current Medications: Current Meds  Medication Sig  . acetaminophen (TYLENOL) 500 MG tablet Take 1,000 mg by mouth 2 (two) times daily as needed for headache.  [name] aspirin EC 81 MG tablet Take 1 tablet by mouth daily.  [name] atorvastatin (LIPITOR) 40 MG tablet TAKE 1 TABLET BY MOUTH EVERYDAY AT BEDTIME  . AXIRON 30 MG/ACT SOLN Place 2 Act onto the skin daily.  . Coenzyme Q10 (CO Q 10 PO) Take 1 capsule by mouth daily.   . Immune Globulin, Human, (HIZENTRA) 4 GM/20ML SOLN Inject into the skin once a week. wednesday  . LamoTRIgine 300 MG TB24 24 hour tablet Take 1 tablet (300 mg total) by mouth every morning.  . latanoprost (XALATAN) 0.005 % ophthalmic solution 1 DROP EACH EYE AT NIGHT  . levothyroxine (SYNTHROID) 150 MCG tablet TAKE 1 TABLET BY MOUTH EVERY DAY BEFORE BREAKFAST  . LORazepam (ATIVAN) 0.5 MG tablet Take 1  tab daily as needed and 1 tab at bed time  . LUTEIN PO Take 1 tablet by mouth daily.   [name] LYCOPENE PO Take by mouth.  . meloxicam (MOBIC) 15 MG tablet Take 1 tablet (15 mg total) by mouth daily as needed. Patient needs office visit before refills will be given  . Multiple Vitamin (MULTI-VITAMINS) TABS Take by mouth.  . OMEGA-3 KRILL OIL PO Take 1 capsule by mouth daily.   [name] omeprazole (PRILOSEC) 20 MG capsule Take 20 mg by mouth every evening.  . Saw Palmetto, Serenoa repens, (SAW PALMETTO PO) Take by mouth.  . sildenafil (REVATIO) 20 MG tablet Take 1-5 tablets (20-100 mg total) by mouth daily as needed.  . sucralfate (CARAFATE) 1 G tablet Take 1 g by mouth daily as needed (ACID REFLUX).   . tadalafil (ADCIRCA/CIALIS) 20 MG tablet TAKE 1 TABLET BY MOUTH ONCE DAILY AS NEEDED FOR ERECTILE DYSFUNCTION  . tamsulosin (FLOMAX) 0.4 MG CAPS capsule Take 1 capsule (0.4 mg total) by mouth daily.  [name] telmisartan (MICARDIS) 80 MG tablet TAKE 1 TABLET (80 MG TOTAL) BY MOUTH DAILY.  [name] zolpidem (AMBIEN) 10 MG tablet Take 1/2 tab at bed time and take other half if needed     Allergies:   Percocet [oxycodone-acetaminophen] and Vicodin [hydrocodone-acetaminophen]   Social History   Socioeconomic History  . Marital status: Married    Spouse name: Not on file  . Number of children: Not on file  . Years of education: Not on file  . Highest education level: Not on file  Occupational History  . Not on file  Social Needs  . Financial resource strain: Not on file  . Food insecurity    Worry: Not on file    Inability: Not on file  . Transportation needs    Medical: Not on file    Non-medical: Not on file  Tobacco Use  . Smoking status: Never Smoker  . Smokeless tobacco: Never Used  Substance and Sexual Activity  . Alcohol use: Yes  Alcohol/week: 0.0 standard drinks    Comment: 1-2 glasses per month  . Drug use: No  . Sexual activity: Yes    Partners: Female    Birth control/protection: None   Lifestyle  . Physical activity    Days per week: Not on file    Minutes per session: Not on file  . Stress: Not on file  Relationships  . Social Herbalist on phone: Not on file    Gets together: Not on file    Attends religious service: Not on file    Active member of club or organization: Not on file    Attends meetings of clubs or organizations: Not on file    Relationship status: Not on file  Other Topics Concern  . Not on file  Social History Narrative   Originally from [place]. Moved to [place] in 1984. 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 No international travel other than [place]. Has a dog at home, poodle mix. His daughter who lives with him now has a dog. No mold exposure. No bird exposure. No hot tub exposure. Enjoys gardening.      Family History: The patient's family history includes COPD in his father; Depression in his daughter, mother, and sister; Drug abuse in his daughter; Hypertension in his father; Kidney disease in his mother; Lung cancer in his brother; Prostate cancer in his father; Psychiatric Illness in his son; Rectal cancer in his sister; Stroke in his father.  ROS:   Please see the history of present illness.     All other systems reviewed and are negative.  EKGs/Labs/Other Studies Reviewed:    The following studies were reviewed today: MRA 07-21-18  EKG:  EKG is ordered today.  The ekg ordered today demonstrates   Recent Labs: 01/26/2019: BUN 13; Creatinine, Ser 0.95; Hemoglobin 16.8; Platelets 270.0; Potassium 4.3; Sodium 138; TSH 3.00 05/31/2019: ALT 35  Recent Lipid Panel    Component Value Date/Time   CHOL 163 05/31/2019 1000   TRIG 83 05/31/2019 1000   TRIG 95 06/04/2006 0825   HDL 75 05/31/2019 1000   CHOLHDL 2.2 05/31/2019 1000   CHOLHDL 2 01/26/2019 0937   VLDL 20.6 01/26/2019 0937   LDLCALC  73 05/31/2019 1000   LDLDIRECT 59.0 02/02/2016 1425    Physical Exam:    VS:  BP 124/74   Pulse 77   Ht 6' (1.829 m)   Wt 179 lb (81.2 kg)   SpO2 96%   BMI 24.28 kg/m     Wt Readings from Last 3 Encounters:  06/03/19 179 lb (81.2 kg)  03/16/19 175 lb (79.4 kg)  01/26/19 176 lb (79.8 kg)     GEN:  Well nourished, well developed in no acute distress HEENT: Normal NECK: No JVD; No carotid bruits LYMPHATICS: No lymphadenopathy CARDIAC: RRR, no murmurs, rubs, gallops RESPIRATORY:  Clear to auscultation without rales, wheezing or rhonchi  ABDOMEN: Soft, non-tender, non-distended MUSCULOSKELETAL:  No edema; No deformity  SKIN: Warm and dry NEUROLOGIC:  Alert and oriented x 3 PSYCHIATRIC:  Normal affect   ASSESSMENT:    Ascending aortic aneurysm (HCC) 4.1 cm Ascending AO aneurysm- yearly MRA or CT  Coronary atherosclerosis 40% LAD in 2007. 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 Followed by Dr Merrilee Jansky at [facility].   PLAN:    In order of problems listed above:  The patient is due for MRA of his chest w/ wo contrast for follow up of his aneurysm.  The patient wants to know if this can be done at [facility] when he gets his lever scan- I will forward to Dr Doylene Canard office.    Medication Adjustments/Labs and Tests Ordered: Current medicines are reviewed at length with the patient today.  Concerns regarding medicines are outlined above.  Orders Placed This Encounter  Procedures  . EKG 12-Lead   No orders of the defined types were placed in this encounter.   Patient Instructions  Medication Instructions:  Your physician recommends that you continue on your current medications as directed. Please refer to the Current Medication list given to you today. *If you need a refill on your cardiac medications before your  next appointment, please call your pharmacy*  Lab Work: None  If you have labs (blood work) drawn today and your tests are completely normal, you will receive your results only by: [name] MyChart Message (if you have MyChart) OR . A paper copy in the mail If you have any lab test that is abnormal or we need to change your treatment, we will call you to review the results.  Testing/Procedures: None   Follow-Up: At [facility], you and your health needs are our priority.  As part of our continuing mission to provide you with exceptional heart care, we have created designated Provider Care Teams.  These Care Teams include your primary Cardiologist (physician) and Advanced Practice Providers (APPs -  Physician Assistants and Nurse Practitioners) who all work together to provide you with the care you need, when you need it.  Your next appointment:   12 months  The format for your next appointment:   In Person  Provider:   [name], MD  Other Instructions     Signed, Kerin Ransom, PA-C  06/03/2019 4:53 PM    [facility]

## 2019-06-03 NOTE — Assessment & Plan Note (Signed)
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 Followed by Dr Merrilee Jansky at [facility].

## 2019-06-03 NOTE — Assessment & Plan Note (Signed)
40% LAD in 2007. No angina

## 2019-06-07 ENCOUNTER — Other Ambulatory Visit: Payer: Self-pay | Admitting: Internal Medicine

## 2019-06-08 ENCOUNTER — Telehealth: Payer: Self-pay | Admitting: Oncology

## 2019-06-08 NOTE — Telephone Encounter (Signed)
Returned patient's phone call regarding cancelling an appointment, per patient's request October and November appointments have been cancelled per 10/27 scheduled message.

## 2019-06-09 ENCOUNTER — Inpatient Hospital Stay: Payer: 59

## 2019-06-14 ENCOUNTER — Ambulatory Visit: Payer: 59 | Admitting: Cardiology

## 2019-06-14 ENCOUNTER — Ambulatory Visit (INDEPENDENT_AMBULATORY_CARE_PROVIDER_SITE_OTHER): Payer: 59 | Admitting: Licensed Clinical Social Worker

## 2019-06-14 ENCOUNTER — Other Ambulatory Visit: Payer: Self-pay

## 2019-06-14 ENCOUNTER — Encounter (HOSPITAL_COMMUNITY): Payer: Self-pay | Admitting: Licensed Clinical Social Worker

## 2019-06-14 ENCOUNTER — Inpatient Hospital Stay: Payer: No Typology Code available for payment source | Admitting: Oncology

## 2019-06-14 DIAGNOSIS — F411 Generalized anxiety disorder: Secondary | ICD-10-CM

## 2019-06-14 DIAGNOSIS — F319 Bipolar disorder, unspecified: Secondary | ICD-10-CM

## 2019-06-14 NOTE — Progress Notes (Signed)
Virtual Visit via Telephone Note  I connected with Alex [name] on 07/02/2019 at 4:00pm by phone as pt is uncomfortable to use video enabled telemedicine application and verified that I am speaking with the correct person using two identifiers.   I discussed the limitations of evaluation and management by telemedicine and the availability of in person appointments. The patient expressed understanding and agreed to proceed.  History of Present Illness:  Pt was referred by Dr. Adele Schilder for OP therapy for bipolar disorder.    Observations/Objective: Pt presents anxious and depressed for his phone therapy session. Pt discussed his psychiatric symptoms and current life events. Patient continues to stress about his son who was discharged from the  [facility] and has been admitted to a [facility] facility in [place]. Assisted patient compartmentalizing his feelings about his son's continued mental illness.  Discussed future plans for his son's discharge from the facility.   PLAN: Acceptance of future  Follow Up Instructions:  I discussed the assessment and treatment plan with the patient. The patient was provided an opportunity to ask questions and all were answered. The patient agreed with the plan and demonstrated an understanding of the instructions.   The patient was advised to call back or seek an in-person evaluation if the symptoms worsen or if the condition fails to improve as anticipated.  I provided 60 minutes of non-face-to-face time during this encounter.   Liller Yohn S, LCAS

## 2019-06-25 ENCOUNTER — Encounter: Payer: Self-pay | Admitting: *Deleted

## 2019-06-25 DIAGNOSIS — I712 Thoracic aortic aneurysm, without rupture, unspecified: Secondary | ICD-10-CM

## 2019-06-28 ENCOUNTER — Ambulatory Visit (HOSPITAL_COMMUNITY): Payer: 59 | Admitting: Licensed Clinical Social Worker

## 2019-06-28 ENCOUNTER — Other Ambulatory Visit: Payer: Self-pay

## 2019-06-28 ENCOUNTER — Telehealth: Payer: Self-pay | Admitting: *Deleted

## 2019-06-28 NOTE — Telephone Encounter (Signed)
Left message for patient to call and schedule MRA chest at [place]

## 2019-06-29 ENCOUNTER — Ambulatory Visit (INDEPENDENT_AMBULATORY_CARE_PROVIDER_SITE_OTHER): Payer: 59 | Admitting: Licensed Clinical Social Worker

## 2019-06-29 ENCOUNTER — Encounter (HOSPITAL_COMMUNITY): Payer: Self-pay | Admitting: Licensed Clinical Social Worker

## 2019-06-29 ENCOUNTER — Other Ambulatory Visit: Payer: Self-pay

## 2019-06-29 DIAGNOSIS — F319 Bipolar disorder, unspecified: Secondary | ICD-10-CM | POA: Diagnosis not present

## 2019-06-29 NOTE — Progress Notes (Signed)
Virtual Visit via Telephone Note  I connected with Alex [name] on 06/29/2019 at 11:00am by phone as pt is uncomfortable due to privacy to use phone enabled telemedicine application and verified that I am speaking with the correct person using two identifiers.   I discussed the limitations of evaluation and management by telemedicine and the availability of in person appointments. The patient expressed understanding and agreed to proceed.  History of Present Illness:  Pt was referred by Dr. Adele Schilder for OP therapy for bipolar disorder.    Observations/Objective: Pt presents anxious and depressed for his phone therapy session. Pt discussed his psychiatric symptoms and current life events. Reviewed tx plan with patient. Pt verbalized acceptance of tx plan. Patient discussed his current stressors and coping skills. Stressors: irresponsible adult children who live in his house, son's continued mental illness, dysfunctional family dynamics. Coached patient on the benefits of mindfulness skills.  PLAN: Acceptance of future  Follow Up Instructions:  I discussed the assessment and treatment plan with the patient. The patient was provided an opportunity to ask questions and all were answered. The patient agreed with the plan and demonstrated an understanding of the instructions.   The patient was advised to call back or seek an in-person evaluation if the symptoms worsen or if the condition fails to improve as anticipated.  I provided 60 minutes of non-face-to-face time during this encounter.   MACKENZIE,LISBETH S, LCAS

## 2019-07-12 ENCOUNTER — Encounter (HOSPITAL_COMMUNITY): Payer: Self-pay | Admitting: Licensed Clinical Social Worker

## 2019-07-12 ENCOUNTER — Other Ambulatory Visit: Payer: Self-pay

## 2019-07-12 ENCOUNTER — Ambulatory Visit (INDEPENDENT_AMBULATORY_CARE_PROVIDER_SITE_OTHER): Payer: 59 | Admitting: Licensed Clinical Social Worker

## 2019-07-12 DIAGNOSIS — F319 Bipolar disorder, unspecified: Secondary | ICD-10-CM | POA: Diagnosis not present

## 2019-07-12 NOTE — Progress Notes (Signed)
Virtual Visit via Telephone Note  I connected with Javontae Dickes Cacioppo on 07/12/2019 at 4:00 pm EST by phone as patient is uncomfortable due to privacy to use phone enabled telemedicine application and verified that I am speaking with the correct person using two identifiers.   I discussed the limitations of evaluation and management by telemedicine and the availability of in person appointments. The patient expressed understanding and agreed to proceed.  History of Present Illness:  Pt was referred by Dr. Adele Schilder for OP therapy for bipolar disorder.    Observations/Objective: Pt presents anxious and depressed for his phone therapy session. Pt discussed his psychiatric symptoms and current life events. Patient reports his son has returned from his 30 day inpatient care. He explained his fears, negative thoughts and concerns.Used CBT to assist patient with his negative thought patterns. Patient discussed the improvement in the relationship with his wife. Used socratic questions. Patient continued his discussion of his biggest stressors: his dysfunctional children who live with him. Discussed acceptance.   Follow Up Instructions:  I discussed the assessment and treatment plan with the patient. The patient was provided an opportunity to ask questions and all were answered. The patient agreed with the plan and demonstrated an understanding of the instructions.   The patient was advised to call back or seek an in-person evaluation if the symptoms worsen or if the condition fails to improve as anticipated.  I provided 60 minutes of non-face-to-face time during this encounter.   MACKENZIE,LISBETH S, LCAS

## 2019-07-15 ENCOUNTER — Other Ambulatory Visit: Payer: Self-pay | Admitting: Cardiology

## 2019-07-15 ENCOUNTER — Other Ambulatory Visit: Payer: Self-pay | Admitting: Internal Medicine

## 2019-07-19 DIAGNOSIS — D490 Neoplasm of unspecified behavior of digestive system: Secondary | ICD-10-CM | POA: Insufficient documentation

## 2019-07-20 ENCOUNTER — Other Ambulatory Visit: Payer: Self-pay | Admitting: Cardiology

## 2019-07-21 ENCOUNTER — Other Ambulatory Visit: Payer: Self-pay

## 2019-07-21 ENCOUNTER — Ambulatory Visit
Admission: RE | Admit: 2019-07-21 | Discharge: 2019-07-21 | Disposition: A | Discharge status: Home / Self Care | Payer: 59 | Source: Ambulatory Visit | Attending: Cardiology | Admitting: Cardiology

## 2019-07-21 DIAGNOSIS — I712 Thoracic aortic aneurysm, without rupture, unspecified: Secondary | ICD-10-CM

## 2019-07-21 MED ORDER — GADOBENATE DIMEGLUMINE 529 MG/ML IV SOLN
15.0000 mL | Freq: Once | INTRAVENOUS | Status: AC | PRN
  Administered 2019-07-21: 15 mL via INTRAVENOUS

## 2019-07-26 ENCOUNTER — Other Ambulatory Visit: Payer: Self-pay

## 2019-07-26 ENCOUNTER — Encounter (HOSPITAL_COMMUNITY): Payer: Self-pay | Admitting: Licensed Clinical Social Worker

## 2019-07-26 ENCOUNTER — Ambulatory Visit (INDEPENDENT_AMBULATORY_CARE_PROVIDER_SITE_OTHER): Payer: No Typology Code available for payment source | Admitting: Licensed Clinical Social Worker

## 2019-07-26 ENCOUNTER — Telehealth: Payer: Self-pay | Admitting: *Deleted

## 2019-07-26 DIAGNOSIS — F319 Bipolar disorder, unspecified: Secondary | ICD-10-CM

## 2019-07-26 DIAGNOSIS — F411 Generalized anxiety disorder: Secondary | ICD-10-CM | POA: Diagnosis not present

## 2019-07-26 NOTE — Progress Notes (Signed)
Virtual Visit via Telephone Note  I connected with Alex [name] on 07/26/2019 at 4:00 pm EST by phone as patient is uncomfortable due to privacy to use phone enabled telemedicine application and verified that I am speaking with the correct person using two identifiers.   I discussed the limitations of evaluation and management by telemedicine and the availability of in person appointments. The patient expressed understanding and agreed to proceed.  History of Present Illness:  Pt was referred by Dr. Adele Schilder for OP therapy for bipolar disorder.    Observations/Objective: Pt presents anxious and depressed for his phone therapy session. Pt discussed his psychiatric symptoms and current life events. Patient discussed his current stressors and coping skills. Patient discussed his 2 adult children that continue to live at home. Discussed "letting go" vs "acceptance."  Patient discussed his current coping skills to deal with "at home" stressors. Practiced coping skills in session.     Follow Up Instructions:  I discussed the assessment and treatment plan with the patient. The patient was provided an opportunity to ask questions and all were answered. The patient agreed with the plan and demonstrated an understanding of the instructions.   The patient was advised to call back or seek an in-person evaluation if the symptoms worsen or if the condition fails to improve as anticipated.  I provided 60 minutes of non-face-to-face time during this encounter.   Elmer Boutelle S, LCAS

## 2019-07-26 NOTE — Telephone Encounter (Signed)
error 

## 2019-07-28 ENCOUNTER — Other Ambulatory Visit: Payer: Self-pay | Admitting: Orthopaedic Surgery

## 2019-07-28 DIAGNOSIS — M542 Cervicalgia: Secondary | ICD-10-CM

## 2019-07-30 ENCOUNTER — Ambulatory Visit
Admission: RE | Admit: 2019-07-30 | Discharge: 2019-07-30 | Disposition: A | Discharge status: Home / Self Care | Payer: No Typology Code available for payment source | Source: Ambulatory Visit | Attending: Orthopaedic Surgery | Admitting: Orthopaedic Surgery

## 2019-07-30 DIAGNOSIS — M542 Cervicalgia: Secondary | ICD-10-CM

## 2019-08-16 ENCOUNTER — Encounter (HOSPITAL_COMMUNITY): Payer: Self-pay | Admitting: Licensed Clinical Social Worker

## 2019-08-16 ENCOUNTER — Other Ambulatory Visit: Payer: Self-pay

## 2019-08-16 ENCOUNTER — Other Ambulatory Visit: Payer: Self-pay | Admitting: Internal Medicine

## 2019-08-16 ENCOUNTER — Ambulatory Visit (INDEPENDENT_AMBULATORY_CARE_PROVIDER_SITE_OTHER): Payer: No Typology Code available for payment source | Admitting: Licensed Clinical Social Worker

## 2019-08-16 DIAGNOSIS — F319 Bipolar disorder, unspecified: Secondary | ICD-10-CM | POA: Diagnosis not present

## 2019-08-16 NOTE — Progress Notes (Signed)
Virtual Visit via Telephone Note  I connected with Alex [name] on 08/16/2019 at 4:00 pm EST by phone as patient is uncomfortable due to privacy to use phone enabled telemedicine application and verified that I am speaking with the correct person using two identifiers.   I discussed the limitations of evaluation and management by telemedicine and the availability of in person appointments. The patient expressed understanding and agreed to proceed.  History of Present Illness:  Pt was referred by Dr. Adele Schilder for OP therapy for bipolar disorder.    Observations/Objective: Pt presents anxious and depressed for his phone therapy session. Pt discussed his psychiatric symptoms and current life events. Patient discussed his holidays, coping with family stressors. Asked open ended questions. Patient discussed his current stressor: his son's mental illness, acting out, self harm, physical issues. Patient reports his son has been talking about suicide if he can't have a particular surgery, caused by his self harm. Gave patient a platform to talk about his feelings about his son, his fears, concerns.    Follow Up Instructions:  I discussed the assessment and treatment plan with the patient. The patient was provided an opportunity to ask questions and all were answered. The patient agreed with the plan and demonstrated an understanding of the instructions.   The patient was advised to call back or seek an in-person evaluation if the symptoms worsen or if the condition fails to improve as anticipated.  I provided 60 minutes of non-face-to-face time during this encounter.   Mirayah Wren S, LCAS

## 2019-08-23 ENCOUNTER — Ambulatory Visit (INDEPENDENT_AMBULATORY_CARE_PROVIDER_SITE_OTHER): Payer: No Typology Code available for payment source | Admitting: Psychiatry

## 2019-08-23 ENCOUNTER — Other Ambulatory Visit: Payer: Self-pay

## 2019-08-23 ENCOUNTER — Encounter (HOSPITAL_COMMUNITY): Payer: Self-pay | Admitting: Psychiatry

## 2019-08-23 DIAGNOSIS — F411 Generalized anxiety disorder: Secondary | ICD-10-CM | POA: Diagnosis not present

## 2019-08-23 DIAGNOSIS — F319 Bipolar disorder, unspecified: Secondary | ICD-10-CM | POA: Diagnosis not present

## 2019-08-23 MED ORDER — ZOLPIDEM TARTRATE 10 MG PO TABS: ORAL_TABLET | ORAL | 1 refills | Status: DC

## 2019-08-23 MED ORDER — LAMOTRIGINE ER 300 MG PO TB24: 1.0000 | ORAL_TABLET | ORAL | 0 refills | Status: DC

## 2019-08-23 MED ORDER — LORAZEPAM 0.5 MG PO TABS: ORAL_TABLET | ORAL | 2 refills | Status: DC

## 2019-08-23 NOTE — Progress Notes (Signed)
Virtual Visit via Telephone Note  I connected with Alex [name] on 08/23/19 at  1:00 PM EST by telephone and verified that I am speaking with the correct person using two identifiers.   I discussed the limitations, risks, security and privacy concerns of performing an evaluation and management service by telephone and the availability of in person appointments. I also discussed with the patient that there may be a patient responsible charge related to this service. The patient expressed understanding and agreed to proceed.   History of Present Illness: Patient was evaluated by phone session.  Patient told his Christmas was very busy because all his children came to visit them.  Patient is pleased that nothing major happened and things are stable.  He is hoping to get steroid injection soon to help his pain.  He is happy that his son likes his new physician and he is working and recently there has been no psych hospitalization.  He is sleeping okay.  He is in therapy with [name].  He still have chronic family issues since 5 people are living including him and his wife in the house.  However he feels the medicine helping him and does not get very agitated, having mood swing or anger.  He do not recall any recent outbursts.  He takes lorazepam every day and second if needed and Ambien half to 1 tablet for insomnia.  He denies any rash or any itching.  His energy level is okay.  He denies any weight change.  Patient like to continue his medication since it is working.  Denies drinking or using any illegal substances.   Past Psychiatric History:Reviewed. [facility]dep[facility]mood swing[facility] but[facility]. No h/o psychosis buth/o poor impulse control. In the past tookSeroquel,Cymbaltaand Remeron (increase depression)  Psychiatric Specialty Exam: Physical Exam  Review of Systems  There were no vitals taken for this  visit.There is no height or weight on file to calculate BMI.  General Appearance: NA  Eye Contact:  NA  Speech:  Clear and Coherent  Volume:  Normal  Mood:  Anxious  Affect:  NA  Thought Process:  Goal Directed  Orientation:  Full (Time, Place, and Person)  Thought Content:  Rumination  Suicidal Thoughts:  No  Homicidal Thoughts:  No  Memory:  Immediate;   Good Recent;   Good Remote;   Good  Judgement:  Intact  Insight:  Present  Psychomotor Activity:  NA  Concentration:  Concentration: Fair and Attention Span: Fair  Recall:  Good  Fund of Knowledge:  Good  Language:  Good  Akathisia:  No  Handed:  Right  AIMS (if indicated):     Assets:  Communication Skills Desire for Improvement Housing Resilience  ADL's:  Intact  Cognition:  WNL  Sleep:   fair      Assessment and Plan: Bipolar disorder type I.  Generalized anxiety disorder.  Discusses chronic situational and family issues.  Patient is working on these issues with the help of therapist.  He does not want to change medication.  Continue Ambien 10 mg half to 1 tablet as needed, continue lorazepam 0.5 mg daily and second if needed and Lamictal 300 mg daily.  Discussed medication side effects and benefits.  He has no rash, itching tremors or shakes.  Encouraged to continue therapy with that McKenzie.  Follow-up in 3 months.  He is getting Ambien from [employer] [facility] and other medication at [facility].  Follow Up Instructions:  I discussed the assessment and treatment plan with the patient. The patient was provided an opportunity to ask questions and all were answered. The patient agreed with the plan and demonstrated an understanding of the instructions.   The patient was advised to call back or seek an in-person evaluation if the symptoms worsen or if the condition fails to improve as anticipated.  I provided 20 minutes of non-face-to-face time during this encounter.   Kathlee Nations, MD

## 2019-08-30 ENCOUNTER — Ambulatory Visit (INDEPENDENT_AMBULATORY_CARE_PROVIDER_SITE_OTHER): Payer: No Typology Code available for payment source | Admitting: Licensed Clinical Social Worker

## 2019-08-30 ENCOUNTER — Other Ambulatory Visit: Payer: Self-pay

## 2019-08-30 ENCOUNTER — Encounter (HOSPITAL_COMMUNITY): Payer: Self-pay | Admitting: Licensed Clinical Social Worker

## 2019-08-30 DIAGNOSIS — F319 Bipolar disorder, unspecified: Secondary | ICD-10-CM

## 2019-08-30 NOTE — Progress Notes (Signed)
Virtual Visit via Telephone Note  I connected with Alex [name] on 08/16/2019 at 4:00 pm EST by phone as patient is uncomfortable due to privacy to use phone enabled telemedicine application and verified that I am speaking with the correct person using two identifiers.   I discussed the limitations of evaluation and management by telemedicine and the availability of in person appointments. The patient expressed understanding and agreed to proceed.  History of Present Illness:  Pt was referred by Dr. Adele Schilder for OP therapy for bipolar disorder.    Observations/Objective: Pt presents anxious and depressed for his phone therapy session. Pt discussed his psychiatric symptoms and current life events.Patient discussed his current stressors: his son's mental illness, acting out, self harm, physical issues. Patient discussed his dreams for the future for he and his wife. Discussed regret with patient: dreams vs reality. Patient spent a considerable amount of time discussing his regret vs reality, which leads patient to increase in depression.   Follow Up Instructions:  I discussed the assessment and treatment plan with the patient. The patient was provided an opportunity to ask questions and all were answered. The patient agreed with the plan and demonstrated an understanding of the instructions.   The patient was advised to call back or seek an in-person evaluation if the symptoms worsen or if the condition fails to improve as anticipated.  I provided 60 minutes of non-face-to-face time during this encounter.   Treshun Wold S, LCAS

## 2019-09-02 ENCOUNTER — Ambulatory Visit
Admission: RE | Admit: 2019-09-02 | Discharge: 2019-09-02 | Disposition: A | Discharge status: Home / Self Care | Payer: No Typology Code available for payment source | Source: Ambulatory Visit | Attending: Gastroenterology | Admitting: Gastroenterology

## 2019-09-02 ENCOUNTER — Other Ambulatory Visit: Payer: Self-pay

## 2019-09-02 ENCOUNTER — Other Ambulatory Visit: Payer: Self-pay | Admitting: Gastroenterology

## 2019-09-02 ENCOUNTER — Other Ambulatory Visit: Payer: No Typology Code available for payment source

## 2019-09-02 ENCOUNTER — Telehealth: Payer: Self-pay

## 2019-09-02 DIAGNOSIS — R1032 Left lower quadrant pain: Secondary | ICD-10-CM

## 2019-09-02 DIAGNOSIS — R198 Other specified symptoms and signs involving the digestive system and abdomen: Secondary | ICD-10-CM

## 2019-09-02 MED ORDER — IOPAMIDOL (ISOVUE-300) INJECTION 61%
100.0000 mL | Freq: Once | INTRAVENOUS | Status: AC | PRN
  Administered 2019-09-02: 100 mL via INTRAVENOUS

## 2019-09-02 NOTE — Telephone Encounter (Signed)
Has patient contacted ordering physician. He needs to contact them to get results.

## 2019-09-02 NOTE — Telephone Encounter (Signed)
The patient calling   Stating he has been having ABD pain - CT order by Dr. Watt Climes.  CT performed at [facility].    The patient is asking if Dr. Alain Marion would squeeze him in to discuss the CT report.

## 2019-09-06 ENCOUNTER — Encounter: Payer: Self-pay | Admitting: Internal Medicine

## 2019-09-06 ENCOUNTER — Ambulatory Visit: Payer: No Typology Code available for payment source | Admitting: Internal Medicine

## 2019-09-06 ENCOUNTER — Other Ambulatory Visit: Payer: Self-pay

## 2019-09-06 DIAGNOSIS — R1032 Left lower quadrant pain: Secondary | ICD-10-CM | POA: Diagnosis not present

## 2019-09-06 MED ORDER — SENNOSIDES-DOCUSATE SODIUM 8.6-50 MG PO TABS: 1.0000 | ORAL_TABLET | Freq: Two times a day (BID) | ORAL | 11 refills | Status: DC | PRN

## 2019-09-06 NOTE — Progress Notes (Signed)
Subjective:  Patient ID: Alex [name], male    DOB: Nov 04, 1955  Age: 64 y.o. MRN: [id number]  CC: No chief complaint on file.   HPI Alex [name] presents for L iliacus muscle by CT 1/21 - done due to LLQ pain  -- 4-5 out of 10  Outpatient Medications Prior to Visit  Medication Sig Dispense Refill  . acetaminophen (TYLENOL) 500 MG tablet Take 1,000 mg by mouth 2 (two) times daily as needed for headache.    [name] aspirin EC 81 MG tablet Take 1 tablet by mouth daily.    [name] atorvastatin (LIPITOR) 40 MG tablet TAKE 1 TABLET BY MOUTH EVERYDAY AT BEDTIME 90 tablet 3  . AXIRON 30 MG/ACT SOLN Place 2 Act onto the skin daily.    . Coenzyme Q10 (CO Q 10 PO) Take 1 capsule by mouth daily.     . Immune Globulin, Human, (HIZENTRA) 4 GM/20ML SOLN Inject into the skin once a week. wednesday    . LamoTRIgine 300 MG TB24 24 hour tablet Take 1 tablet (300 mg total) by mouth every morning. 90 tablet 0  . latanoprost (XALATAN) 0.005 % ophthalmic solution 1 DROP EACH EYE AT NIGHT    . levothyroxine (SYNTHROID) 150 MCG tablet TAKE 1 TABLET BY MOUTH EVERY DAY BEFORE BREAKFAST 90 tablet 3  . LORazepam (ATIVAN) 0.5 MG tablet Take 1 tab daily as needed and 1 tab at bed time 45 tablet 2  . LUTEIN PO Take 1 tablet by mouth daily.     [name] LYCOPENE PO Take by mouth.    . meloxicam (MOBIC) 15 MG tablet TAKE 1 TABLET BY MOUTH ONCE A DAY AS NEEDED (NEEDS OFFICE VISIT FOR FUTURE FILLS) 30 tablet 0  . Multiple Vitamin (MULTI-VITAMINS) TABS Take by mouth.    . OMEGA-3 KRILL OIL PO Take 1 capsule by mouth daily.     [name] omeprazole (PRILOSEC) 20 MG capsule Take 20 mg by mouth every evening.    . Saw Palmetto, Serenoa repens, (SAW PALMETTO PO) Take by mouth.    . sildenafil (REVATIO) 20 MG tablet Take 1-5 tablets (20-100 mg total) by mouth daily as needed. 30 tablet 11  . sucralfate (CARAFATE) 1 G tablet Take 1 g by mouth daily as needed (ACID REFLUX).   3  . tadalafil (ADCIRCA/CIALIS) 20 MG tablet TAKE 1 TABLET BY MOUTH  ONCE DAILY AS NEEDED FOR ERECTILE DYSFUNCTION 30 tablet 1  . tamsulosin (FLOMAX) 0.4 MG CAPS capsule TAKE 1 CAPSULE BY MOUTH EVERY DAY 90 capsule 1  . telmisartan (MICARDIS) 80 MG tablet TAKE 1 TABLET BY MOUTH EVERY DAY 90 tablet 2  . zolpidem (AMBIEN) 10 MG tablet Take 1/2 tab at bed time and take other half if needed 30 tablet 1   No facility-administered medications prior to visit.    ROS: Review of Systems  Constitutional: Negative for appetite change, fatigue and unexpected weight change.  HENT: Negative for congestion, nosebleeds, sneezing, sore throat and trouble swallowing.   Eyes: Negative for itching and visual disturbance.  Respiratory: Negative for cough.   Cardiovascular: Negative for chest pain, palpitations and leg swelling.  Gastrointestinal: Negative for abdominal distention, blood in stool, diarrhea and nausea.  Genitourinary: Negative for frequency and hematuria.  Musculoskeletal: Negative for back pain, gait problem, joint swelling and neck pain.  Skin: Negative for rash.  Neurological: Negative for dizziness, tremors, speech difficulty and weakness.  Psychiatric/Behavioral: Negative for agitation, dysphoric mood and sleep disturbance. The patient is not nervous/anxious.  Objective:  BP (!) 144/86 (BP Location: Left Arm, Patient Position: Sitting, Cuff Size: Normal)   Pulse 89   Temp 98 F (36.7 C) (Oral)   Ht 6' (1.829 m)   Wt 183 lb (83 kg)   SpO2 98%   BMI 24.82 kg/m   BP Readings from Last 3 Encounters:  09/06/19 (!) 144/86  06/03/19 124/74  03/16/19 (!) 148/92    Wt Readings from Last 3 Encounters:  09/06/19 183 lb (83 kg)  06/03/19 179 lb (81.2 kg)  03/16/19 175 lb (79.4 kg)    Physical Exam  Lab Results  Component Value Date   WBC 9.3 01/26/2019   HGB 16.8 01/26/2019   HCT 50.8 01/26/2019   PLT 270.0 01/26/2019   GLUCOSE 83 01/26/2019   CHOL 163 05/31/2019   TRIG 83 05/31/2019   HDL 75 05/31/2019   LDLDIRECT 59.0 02/02/2016    LDLCALC 73 05/31/2019   ALT 35 05/31/2019   AST 34 05/31/2019   NA 138 01/26/2019   K 4.3 01/26/2019   CL 101 01/26/2019   CREATININE 0.95 01/26/2019   BUN 13 01/26/2019   CO2 28 01/26/2019   TSH 3.00 01/26/2019   PSA 3.04 01/26/2019   INR 0.87 06/08/2018   HGBA1C 5.5 09/29/2018    CT ABDOMEN PELVIS W CONTRAST  Addendum Date: 09/02/2019   ADDENDUM REPORT: 09/02/2019 11:02 ADDENDUM: The original report was by Dr. Van Clines. The following addendum is by Dr. Van Clines: These results were called by telephone at the time of interpretation on 09/02/2019 at 11:00 am to provider [facility] , who verbally acknowledged these results. By report, the patient has also been followed at [facility] for a pancreatic cystic lesion, the only candidate structure I see on today's CT is a 4 mm faint lobulation along the anterior margin of the pancreatic body on image 26/2 which may could represent a tiny cyst. Electronically Signed   By: Van Clines [name]   On: 09/02/2019 11:02   Result Date: 09/02/2019 CLINICAL DATA:  Left flank and left lower quadrant abdominal pain starting earlier this week. EXAM: CT ABDOMEN AND PELVIS WITH CONTRAST TECHNIQUE: Multidetector CT imaging of the abdomen and pelvis was performed using the standard protocol following bolus administration of intravenous contrast. CONTRAST:  14mL ISOVUE-300 IOPAMIDOL (ISOVUE-300) INJECTION 61% COMPARISON:  Chest CT 06/13/2017; abdominal ultrasound 04/07/2015; MRI abdomen 06/22/2017; abdominal ultrasound 12/22/2017 FINDINGS: Lower chest: Descending thoracic aortic atherosclerotic calcification. Hepatobiliary: Several hepatic cysts are similar to the prior examination. There is a portacaval vascular malformation posteriorly in the right hepatic lobe with an associated varix measuring 2.7 by 2.0 cm on image 24/2, previously 1.5 by 1.2 cm on 06/22/2017, and measured at up to 1.9 cm in long axis on the interval Doppler ultrasound of 12/22/2017.  No findings of cirrhosis. Gallbladder unremarkable. Pancreas: Unremarkable Spleen: Unremarkable Adrenals/Urinary Tract: Two right renal calculi are identified, the larger in the mid kidney measuring 0.6 cm in long axis on image 47/3. Five left renal calculi are identified, the largest in the lower pole measuring 0.4 cm on image 60/3. No ureteral or bladder calculus identified. Bladder wall thickening is probably primarily from nondistention and appears uniform. Adrenal glands normal. Stomach/Bowel: Wall thickening in the lower rectum for example on image 81/2, nonspecific and possibly due to nondistention. There is sigmoid colon diverticulosis. Subtle stranding in the sigmoid colon mesentery is probably attributable to the inflammation along the adjacent retroperitoneum but could conceivably reflect low-grade diverticulitis. No diverticular abscess or extraluminal  gas. Vascular/Lymphatic: Aortoiliac atherosclerotic vascular disease. Reproductive: Prostatomegaly. Dystrophic central prostate calcification. Other: Acute hemorrhage of the left iliacus muscle and also tracking in the posterior perirenal space, with stranding tracking along the retro renal fascia on the left, and along the left psoas muscle. There is edema tracking in the lower retroperitoneum and over to the right side in the upper pelvis, as well as mild presacral edema. There is edema along the left transversalis fascia adjacent to the iliacus muscle, and low-grade edema extending around anteriorly to the space of Retzius, and along the margins of the perirectal space. The posterior perirenal space hematoma measures about 8.6 by 4.0 by 7.3 cm (volume = 130 cm^3), but this does not include the hematoma component in the left iliacus muscle. I do not see any compelling findings of active extravasation. Stranding around the distal iliopsoas muscle and tendon extend down to the lesser trochanter insertion site. Musculoskeletal: Old healed fractures of both  tenth ribs posterolaterally. Prior posterolateral rod and pedicle screw fixation at L3-4. Mild levoconvex lumbar scoliosis. IMPRESSION: 1. Acute hemorrhage of the left iliacus muscle and tracking in the left posterior perirenal space, with associated edema and stranding tracking in the retroperitoneum, left greater than right pelvic sidewalls, and presacral region. The component of hematoma in the posterior perirenal space on the left is about 130 cubic cm, but this does not include the component of hematoma within the iliacus muscle. No active extravasation identified. 2. Bilateral nonobstructive nephrolithiasis. 3. Sigmoid colon diverticulosis. Subtle stranding in the sigmoid colon mesentery is probably attributable to the inflammation along the adjacent retroperitoneum but could conceivably reflect low-grade diverticulitis. 4. 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 Current measurement 2.7 by 2.0 cm, previously 1.5 by 1.2 cm on 06/22/2017. Such lesions, especially when larger, can lead to complications such as hepatic encephalopathy. If the patient develops associated symptoms, then surgical or percutaneous treatment would be recommended. In the absence of symptoms, surveillance by ultrasound every 6 months is recommended to track in the further growth. 5. Wall thickening in the lower rectum is technically nonspecific, but probably from nondistention. 6. Prostatomegaly. 7. Aortic Atherosclerosis (ICD10-I70.0). Radiology assistant personnel have been notified to put me in telephone contact with the referring physician or the referring physician's clinical representative in order to discuss these findings. Once this communication is established I will issue an addendum to this report for documentation purposes. Electronically Signed: By: Van Clines [name] On: 09/02/2019 10:32    Assessment & Plan:   There are no diagnoses linked to this  encounter.   No orders of the defined types were placed in this encounter.    Follow-up: No follow-ups on file.  Walker Kehr, MD

## 2019-09-06 NOTE — Assessment & Plan Note (Signed)
Abd CT 09/02/19: Acute hemorrhage of the left iliacus muscle and tracking in the left posterior perirenal space, with associated edema and stranding tracking in the retroperitoneum, left greater than right pelvic sidewalls, and presacral region. The component of hematoma in the posterior perirenal space on the left is about 130 cubic cm, but this does not include the component of hematoma within the iliacus muscle. No active extravasation identified.  Tramadol prn Senokot S

## 2019-09-09 ENCOUNTER — Telehealth: Payer: Self-pay | Admitting: Internal Medicine

## 2019-09-09 NOTE — Telephone Encounter (Signed)
Called pt, LVM stating that lab orders have been corrected and [facility] requisition is ready for pick up.

## 2019-09-09 NOTE — Telephone Encounter (Signed)
   Patient no longer wants to go to free standing [facility]. Patient to go to [facility] on 01/29 for labs

## 2019-09-09 NOTE — Telephone Encounter (Signed)
    Patient requesting lab orders be changed. Patient will NOT be going to [facility] to have labs done. Patient is going to a free standing [facility] center. The current orders are not visible to [facility]  Please call

## 2019-09-11 ENCOUNTER — Other Ambulatory Visit: Payer: Self-pay | Admitting: Internal Medicine

## 2019-09-13 ENCOUNTER — Other Ambulatory Visit: Payer: No Typology Code available for payment source

## 2019-09-13 ENCOUNTER — Ambulatory Visit (HOSPITAL_COMMUNITY): Payer: No Typology Code available for payment source | Admitting: Licensed Clinical Social Worker

## 2019-09-13 ENCOUNTER — Ambulatory Visit: Payer: No Typology Code available for payment source | Admitting: Internal Medicine

## 2019-09-13 ENCOUNTER — Other Ambulatory Visit: Payer: Self-pay

## 2019-09-13 ENCOUNTER — Encounter: Payer: Self-pay | Admitting: Internal Medicine

## 2019-09-13 DIAGNOSIS — F439 Reaction to severe stress, unspecified: Secondary | ICD-10-CM

## 2019-09-13 DIAGNOSIS — E785 Hyperlipidemia, unspecified: Secondary | ICD-10-CM | POA: Diagnosis not present

## 2019-09-13 DIAGNOSIS — F329 Major depressive disorder, single episode, unspecified: Secondary | ICD-10-CM | POA: Diagnosis not present

## 2019-09-13 DIAGNOSIS — R1032 Left lower quadrant pain: Secondary | ICD-10-CM | POA: Diagnosis not present

## 2019-09-13 LAB — CBC WITH DIFFERENTIAL/PLATELET
Basophils Absolute: 0.1 10*3/uL (ref 0.0–0.1)
Basophils Relative: 1.1 % (ref 0.0–3.0)
Eosinophils Absolute: 0.1 10*3/uL (ref 0.0–0.7)
Eosinophils Relative: 1.1 % (ref 0.0–5.0)
HCT: 46.5 % (ref 39.0–52.0)
Hemoglobin: 15.1 g/dL (ref 13.0–17.0)
Lymphocytes Relative: 12.7 % (ref 12.0–46.0)
Lymphs Abs: 1.3 10*3/uL (ref 0.7–4.0)
MCHC: 32.5 g/dL (ref 30.0–36.0)
MCV: 82.9 fl (ref 78.0–100.0)
Monocytes Absolute: 1.1 10*3/uL — ABNORMAL HIGH (ref 0.1–1.0)
Monocytes Relative: 10 % (ref 3.0–12.0)
Neutro Abs: 7.9 10*3/uL — ABNORMAL HIGH (ref 1.4–7.7)
Neutrophils Relative %: 75.1 % (ref 43.0–77.0)
Platelets: 347 10*3/uL (ref 150.0–400.0)
RBC: 5.61 Mil/uL (ref 4.22–5.81)
RDW: 15.8 % — ABNORMAL HIGH (ref 11.5–15.5)
WBC: 10.5 10*3/uL (ref 4.0–10.5)

## 2019-09-13 LAB — BASIC METABOLIC PANEL
BUN: 18 mg/dL (ref 6–23)
CO2: 28 mEq/L (ref 19–32)
Calcium: 8.7 mg/dL (ref 8.4–10.5)
Chloride: 100 mEq/L (ref 96–112)
Creatinine, Ser: 1.02 mg/dL (ref 0.40–1.50)
GFR: 73.12 mL/min (ref >60.00)
Glucose, Bld: 135 mg/dL — ABNORMAL HIGH (ref 70–99)
Potassium: 3.9 mEq/L (ref 3.5–5.1)
Sodium: 136 mEq/L (ref 135–145)

## 2019-09-13 LAB — HEPATIC FUNCTION PANEL
ALT: 33 U/L (ref 0–53)
AST: 41 U/L — ABNORMAL HIGH (ref 0–37)
Albumin: 4.3 g/dL (ref 3.5–5.2)
Alkaline Phosphatase: 79 U/L (ref 39–117)
Bilirubin, Direct: 0.2 mg/dL (ref 0.0–0.3)
Total Bilirubin: 1.1 mg/dL (ref 0.2–1.2)
Total Protein: 7.1 g/dL (ref 6.0–8.3)

## 2019-09-13 LAB — PROTIME-INR
INR: 1 ratio (ref 0.8–1.0)
Prothrombin Time: 11.1 s (ref 9.6–13.1)

## 2019-09-13 NOTE — Progress Notes (Signed)
Subjective:  Patient ID: Alex [name], male    DOB: 04/13/1956  Age: 64 y.o. MRN: [id number]  CC: No chief complaint on file.   HPI [name] presents for acute hemorrhage of the left iliacus muscle f/u. LLQ pain is better. C/o L flank pain too The pain is 70% better C/o a lot of home stress with [name] and his ongoing self harming behavior.  Wille Glaser is very worn out.  Outpatient Medications Prior to Visit  Medication Sig Dispense Refill  . acetaminophen (TYLENOL) 500 MG tablet Take 1,000 mg by mouth 2 (two) times daily as needed for headache.    [name] aspirin EC 81 MG tablet Take 1 tablet by mouth daily.    [name] atorvastatin (LIPITOR) 40 MG tablet TAKE 1 TABLET BY MOUTH EVERYDAY AT BEDTIME 90 tablet 3  . AXIRON 30 MG/ACT SOLN Place 2 Act onto the skin daily.    . Coenzyme Q10 (CO Q 10 PO) Take 1 capsule by mouth daily.     . Immune Globulin, Human, (HIZENTRA) 4 GM/20ML SOLN Inject into the skin once a week. wednesday    . LamoTRIgine 300 MG TB24 24 hour tablet Take 1 tablet (300 mg total) by mouth every morning. 90 tablet 0  . latanoprost (XALATAN) 0.005 % ophthalmic solution 1 DROP EACH EYE AT NIGHT    . levothyroxine (SYNTHROID) 150 MCG tablet TAKE 1 TABLET BY MOUTH EVERY DAY BEFORE BREAKFAST 90 tablet 3  . LORazepam (ATIVAN) 0.5 MG tablet Take 1 tab daily as needed and 1 tab at bed time 45 tablet 2  . LUTEIN PO Take 1 tablet by mouth daily.     [name] LYCOPENE PO Take by mouth.    . meloxicam (MOBIC) 15 MG tablet Take 1 tablet (15 mg total) by mouth daily. 30 tablet 5  . Multiple Vitamin (MULTI-VITAMINS) TABS Take by mouth.    . OMEGA-3 KRILL OIL PO Take 1 capsule by mouth daily.     [name] omeprazole (PRILOSEC) 20 MG capsule Take 20 mg by mouth every evening.    . Saw Palmetto, Serenoa repens, (SAW PALMETTO PO) Take by mouth.    . senna-docusate (SENOKOT S) 8.6-50 MG tablet Take 1 tablet by mouth 2 (two) times daily as needed. 60 tablet 11  . sildenafil (REVATIO) 20 MG tablet Take 1-5  tablets (20-100 mg total) by mouth daily as needed. 30 tablet 11  . sucralfate (CARAFATE) 1 G tablet Take 1 g by mouth daily as needed (ACID REFLUX).   3  . tadalafil (ADCIRCA/CIALIS) 20 MG tablet TAKE 1 TABLET BY MOUTH ONCE DAILY AS NEEDED FOR ERECTILE DYSFUNCTION 30 tablet 1  . tamsulosin (FLOMAX) 0.4 MG CAPS capsule TAKE 1 CAPSULE BY MOUTH EVERY DAY 90 capsule 1  . telmisartan (MICARDIS) 80 MG tablet TAKE 1 TABLET BY MOUTH EVERY DAY 90 tablet 2  . zolpidem (AMBIEN) 10 MG tablet Take 1/2 tab at bed time and take other half if needed 30 tablet 1  . traMADol (ULTRAM) 50 MG tablet Take 50-100 mg by mouth every 6 (six) hours as needed.     No facility-administered medications prior to visit.    ROS: Review of Systems  Constitutional: Negative for appetite change, fatigue and unexpected weight change.  HENT: Negative for congestion, nosebleeds, sneezing, sore throat and trouble swallowing.   Eyes: Negative for itching and visual disturbance.  Respiratory: Negative for cough.   Cardiovascular: Negative for chest pain, palpitations and leg swelling.  Gastrointestinal: Positive for  abdominal pain. Negative for abdominal distention, blood in stool, diarrhea and nausea.  Genitourinary: Negative for frequency and hematuria.  Musculoskeletal: Positive for arthralgias, back pain and gait problem. Negative for joint swelling and neck pain.  Skin: Negative for rash.  Neurological: Negative for dizziness, tremors, speech difficulty and weakness.  Psychiatric/Behavioral: Positive for dysphoric mood. Negative for agitation, self-injury, sleep disturbance and suicidal ideas. The patient is nervous/anxious.     Objective:  BP 130/82 (BP Location: Left Arm, Patient Position: Sitting, Cuff Size: Normal)   Pulse 86   Temp 98.7 F (37.1 C) (Oral)   Ht 6' (1.829 m)   Wt 185 lb 2 oz (84 kg)   SpO2 98%   BMI 25.11 kg/m   BP Readings from Last 3 Encounters:  09/13/19 130/82  09/06/19 (!) 144/86    06/03/19 124/74    Wt Readings from Last 3 Encounters:  09/13/19 185 lb 2 oz (84 kg)  09/06/19 183 lb (83 kg)  06/03/19 179 lb (81.2 kg)    Physical Exam Constitutional:      General: He is not in acute distress.    Appearance: He is well-developed.     Comments: NAD  Eyes:     Conjunctiva/sclera: Conjunctivae normal.     Pupils: Pupils are equal, round, and reactive to light.  Neck:     Thyroid: No thyromegaly.     Vascular: No JVD.  Cardiovascular:     Rate and Rhythm: Normal rate and regular rhythm.     Heart sounds: Normal heart sounds. No murmur. No friction rub. No gallop.   Pulmonary:     Effort: Pulmonary effort is normal. No respiratory distress.     Breath sounds: Normal breath sounds. No wheezing or rales.  Chest:     Chest wall: No tenderness.  Abdominal:     General: Bowel sounds are normal. There is no distension.     Palpations: Abdomen is soft. There is no mass.     Tenderness: There is abdominal tenderness. There is no guarding or rebound.     Hernia: No hernia is present.  Musculoskeletal:        General: Tenderness present. Normal range of motion.     Cervical back: Normal range of motion.  Lymphadenopathy:     Cervical: No cervical adenopathy.  Skin:    General: Skin is warm and dry.     Findings: No rash.  Neurological:     Mental Status: He is alert and oriented to person, place, and time.     Cranial Nerves: No cranial nerve deficit.     Motor: No abnormal muscle tone.     Coordination: Coordination normal.     Gait: Gait normal.     Deep Tendon Reflexes: Reflexes are normal and symmetric.  Psychiatric:        Behavior: Behavior normal.        Thought Content: Thought content normal.        Judgment: Judgment normal.   LLQ and L groin - tender We spent over 60 minutes discussing [name]'s stress  Lab Results  Component Value Date   WBC 9.3 01/26/2019   HGB 16.8 01/26/2019   HCT 50.8 01/26/2019   PLT 270.0 01/26/2019   GLUCOSE 83  01/26/2019   CHOL 163 05/31/2019   TRIG 83 05/31/2019   HDL 75 05/31/2019   LDLDIRECT 59.0 02/02/2016   LDLCALC 73 05/31/2019   ALT 35 05/31/2019   AST 34 05/31/2019   NA 138  01/26/2019   K 4.3 01/26/2019   CL 101 01/26/2019   CREATININE 0.95 01/26/2019   BUN 13 01/26/2019   CO2 28 01/26/2019   TSH 3.00 01/26/2019   PSA 3.04 01/26/2019   INR 0.87 06/08/2018   HGBA1C 5.5 09/29/2018    CT ABDOMEN PELVIS W CONTRAST  Addendum Date: 09/02/2019   ADDENDUM REPORT: 09/02/2019 11:02 ADDENDUM: The original report was by Dr. Van Clines. The following addendum is by Dr. Van Clines: These results were called by telephone at the time of interpretation on 09/02/2019 at 11:00 am to provider [facility] , who verbally acknowledged these results. 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 Electronically Signed   By: Van Clines [name]   On: 09/02/2019 11:02   Result Date: 09/02/2019 CLINICAL DATA:  Left flank and left lower quadrant abdominal pain starting earlier this week. EXAM: CT ABDOMEN AND PELVIS WITH CONTRAST TECHNIQUE: Multidetector CT imaging of the abdomen and pelvis was performed using the standard protocol following bolus administration of intravenous contrast. CONTRAST:  157mL ISOVUE-300 IOPAMIDOL (ISOVUE-300) INJECTION 61% COMPARISON:  Chest CT 06/13/2017; abdominal ultrasound 04/07/2015; MRI abdomen 06/22/2017; abdominal ultrasound 12/22/2017 FINDINGS: Lower chest: Descending thoracic aortic atherosclerotic calcification. Hepatobiliary: Several hepatic cysts are similar to the prior examination. There is a portacaval vascular malformation posteriorly in the right hepatic lobe with an associated varix measuring 2.7 by 2.0 cm on image 24/2, previously 1.5 by 1.2  cm on 06/22/2017, and measured at up to 1.9 cm in long axis on the interval Doppler ultrasound of 12/22/2017. No findings of cirrhosis. Gallbladder unremarkable. Pancreas: Unremarkable Spleen: Unremarkable Adrenals/Urinary Tract: Two right renal calculi are identified, the larger in the mid kidney measuring 0.6 cm in long axis on image 47/3. Five left renal calculi are identified, the largest in the lower pole measuring 0.4 cm on image 60/3. No ureteral or bladder calculus identified. Bladder wall thickening is probably primarily from nondistention and appears uniform. Adrenal glands normal. Stomach/Bowel: Wall thickening in the lower rectum for example on image 81/2, nonspecific and possibly due to nondistention. There is sigmoid colon diverticulosis. Subtle stranding in the sigmoid colon mesentery is probably attributable to the inflammation along the adjacent retroperitoneum but could conceivably reflect low-grade diverticulitis. No diverticular abscess or extraluminal gas. Vascular/Lymphatic: Aortoiliac atherosclerotic vascular disease. Reproductive: Prostatomegaly. Dystrophic central prostate calcification. Other: Acute hemorrhage of the left iliacus muscle and also tracking in the posterior perirenal space, with stranding tracking along the retro renal fascia on the left, and along the left psoas muscle. There is edema tracking in the lower retroperitoneum and over to the right side in the upper pelvis, as well as mild presacral edema. There is edema along the left transversalis fascia adjacent to the iliacus muscle, and low-grade edema extending around anteriorly to the space of Retzius, and along the margins of the perirectal space. The posterior perirenal space hematoma measures about 8.6 by 4.0 by 7.3 cm (volume = 130 cm^3), but this does not include the hematoma component in the left iliacus muscle. I do not see any compelling findings of active extravasation. Stranding around the distal iliopsoas muscle  and tendon extend down to the lesser trochanter insertion site. Musculoskeletal: Old healed fractures of both tenth ribs posterolaterally. Prior posterolateral rod and pedicle  screw fixation at L3-4. Mild levoconvex lumbar scoliosis. IMPRESSION: 1. Acute hemorrhage of the left iliacus muscle and tracking in the left posterior perirenal space, with associated edema and stranding tracking in the retroperitoneum, left greater than right pelvic sidewalls, and presacral region. The component of hematoma in the posterior perirenal space on the left is about 130 cubic cm, but this does not include the component of hematoma within the iliacus muscle. No active extravasation identified. 2. Bilateral nonobstructive nephrolithiasis. 3. Sigmoid colon diverticulosis. Subtle stranding in the sigmoid colon mesentery is probably attributable to the inflammation along the adjacent retroperitoneum but could conceivably reflect low-grade diverticulitis. 4. 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 Current measurement 2.7 by 2.0 cm, previously 1.5 by 1.2 cm on 06/22/2017. Such lesions, especially when larger, can lead to complications such as hepatic encephalopathy. If the patient develops associated symptoms, then surgical or percutaneous treatment would be recommended. In the absence of symptoms, surveillance by ultrasound every 6 months is recommended to track in the further growth. 5. Wall thickening in the lower rectum is technically nonspecific, but probably from nondistention. 6. Prostatomegaly. 7. Aortic Atherosclerosis (ICD10-I70.0). Radiology assistant personnel have been notified to put me in telephone contact with the referring physician or the referring physician's clinical representative in order to discuss these findings. Once this communication is established I will issue an addendum to this report for documentation purposes. Electronically Signed: By:  Van Clines [name] On: 09/02/2019 10:32    Assessment & Plan:      Follow-up: No follow-ups on file.  Walker Kehr, MD

## 2019-09-13 NOTE — Assessment & Plan Note (Signed)
Pain is better Cont To watch May need PT

## 2019-09-14 ENCOUNTER — Encounter: Payer: Self-pay | Admitting: Internal Medicine

## 2019-09-14 DIAGNOSIS — F439 Reaction to severe stress, unspecified: Secondary | ICD-10-CM | POA: Insufficient documentation

## 2019-09-14 LAB — APTT: aPTT: 32.9 s — ABNORMAL HIGH (ref 23.4–32.7)

## 2019-09-14 NOTE — Assessment & Plan Note (Signed)
Follow-up with Dr. Adele Schilder.

## 2019-09-14 NOTE — Assessment & Plan Note (Signed)
On Krill oil 

## 2019-09-14 NOTE — Assessment & Plan Note (Signed)
We discussed at length she is chronic home stress with [name] and his ongoing self harming behavior.  Wille Glaser is very worn out.

## 2019-09-15 ENCOUNTER — Ambulatory Visit (INDEPENDENT_AMBULATORY_CARE_PROVIDER_SITE_OTHER): Payer: No Typology Code available for payment source | Admitting: Licensed Clinical Social Worker

## 2019-09-15 ENCOUNTER — Other Ambulatory Visit: Payer: Self-pay

## 2019-09-15 ENCOUNTER — Encounter (HOSPITAL_COMMUNITY): Payer: Self-pay | Admitting: Licensed Clinical Social Worker

## 2019-09-15 DIAGNOSIS — F319 Bipolar disorder, unspecified: Secondary | ICD-10-CM | POA: Diagnosis not present

## 2019-09-16 NOTE — Progress Notes (Signed)
Virtual Visit via Telephone Note  I connected with [facility] on 09/16/2019 at 5:00 pm EST by phone as patient is uncomfortable due to privacy to use phone enabled telemedicine application and verified that I am speaking with the correct person using two identifiers.   I discussed the limitations of evaluation and management by telemedicine and the availability of in person appointments. The patient expressed understanding and agreed to proceed.  History of Present Illness:  Pt was referred by Dr. Adele Schilder for OP therapy for bipolar disorder.    Observations/Objective: Pt presents anxious and depressed for his phone therapy session. Pt discussed his psychiatric symptoms and current life events.Patient discussed his current stressors: his son's mental illness, acting out, self harm, physical issues. Patient's son has continued with his self harming behaviors, which is affecting patient's mental wellness. Provided a platform for patient to discuss how his son's continued self harm and personality disorder is affecting his life and health. Discussed with patient, "What are you willing to do different?" Used Socratic questions.  Follow Up Instructions:  I discussed the assessment and treatment plan with the patient. The patient was provided an opportunity to ask questions and all were answered. The patient agreed with the plan and demonstrated an understanding of the instructions.   The patient was advised to call back or seek an in-person evaluation if the symptoms worsen or if the condition fails to improve as anticipated.  I provided 60 minutes of non-face-to-face time during this encounter.   Alvaretta Eisenberger S, LCAS

## 2019-10-15 ENCOUNTER — Other Ambulatory Visit: Payer: Self-pay | Admitting: Internal Medicine

## 2019-10-21 ENCOUNTER — Other Ambulatory Visit: Payer: Self-pay | Admitting: Internal Medicine

## 2019-11-09 ENCOUNTER — Ambulatory Visit (HOSPITAL_COMMUNITY): Payer: No Typology Code available for payment source | Admitting: Licensed Clinical Social Worker

## 2019-11-10 ENCOUNTER — Other Ambulatory Visit: Payer: Self-pay

## 2019-11-10 ENCOUNTER — Ambulatory Visit (INDEPENDENT_AMBULATORY_CARE_PROVIDER_SITE_OTHER): Payer: 59 | Admitting: Licensed Clinical Social Worker

## 2019-11-10 DIAGNOSIS — F319 Bipolar disorder, unspecified: Secondary | ICD-10-CM | POA: Diagnosis not present

## 2019-11-11 ENCOUNTER — Encounter (HOSPITAL_COMMUNITY): Payer: Self-pay | Admitting: Licensed Clinical Social Worker

## 2019-11-11 NOTE — Progress Notes (Signed)
Virtual Visit via Telephone Note  I connected with Alex [name] on 11/10/2019 at 2:00 pm EDT by phone as patient is uncomfortable due to privacy to use phone enabled telemedicine application and verified that I am speaking with the correct person using two identifiers.I discussed the limitations of evaluation and management by telemedicine and the availability of in person appointments. The patient expressed understanding and agreed to proceed.  History of Present Illness:  Pt was referred by Dr. Adele Schilder for OP therapy for bipolar disorder.    Observations/Objective: Pt presents anxious and depressed for his phone therapy session. Pt discussed his psychiatric symptoms and current life events. Patient has not been to therapy in 7 weeks. "I have had a lot of family issues going on and didn't have time for me."  Patient shared some personal information about family. Clinician utilized MI OARS to reflect and summarize thoughts and feelings about this new normal life he is experiencing. Clinician discussed the importance of focusing on the here and now, what he can control, rather than what he can't control.   Follow Up Instructions:  I discussed the assessment and treatment plan with the patient. The patient was provided an opportunity to ask questions and all were answered. The patient agreed with the plan and demonstrated an understanding of the instructions.   The patient was advised to call back or seek an in-person evaluation if the symptoms worsen or if the condition fails to improve as anticipated.  I provided 60 minutes of non-face-to-face time during this encounter.   Durga Saldarriaga S, LCAS

## 2019-11-22 ENCOUNTER — Other Ambulatory Visit: Payer: Self-pay

## 2019-11-22 ENCOUNTER — Ambulatory Visit (INDEPENDENT_AMBULATORY_CARE_PROVIDER_SITE_OTHER): Payer: 59 | Admitting: Psychiatry

## 2019-11-22 ENCOUNTER — Ambulatory Visit (HOSPITAL_COMMUNITY): Payer: 59 | Admitting: Licensed Clinical Social Worker

## 2019-11-22 ENCOUNTER — Encounter (HOSPITAL_COMMUNITY): Payer: Self-pay | Admitting: Psychiatry

## 2019-11-22 DIAGNOSIS — F319 Bipolar disorder, unspecified: Secondary | ICD-10-CM | POA: Diagnosis not present

## 2019-11-22 DIAGNOSIS — F411 Generalized anxiety disorder: Secondary | ICD-10-CM

## 2019-11-22 MED ORDER — LAMOTRIGINE ER 300 MG PO TB24: 1.0000 | ORAL_TABLET | ORAL | 0 refills | Status: DC

## 2019-11-22 MED ORDER — ZOLPIDEM TARTRATE 10 MG PO TABS: ORAL_TABLET | ORAL | 1 refills | Status: DC

## 2019-11-22 MED ORDER — LORAZEPAM 0.5 MG PO TABS: ORAL_TABLET | ORAL | 2 refills | Status: DC

## 2019-11-22 NOTE — Progress Notes (Signed)
Virtual Visit via Telephone Note  I connected with Alex [name] on 11/22/19 at  1:00 PM EDT by telephone and verified that I am speaking with the correct person using two identifiers.   I discussed the limitations, risks, security and privacy concerns of performing an evaluation and management service by telephone and the availability of in person appointments. I also discussed with the patient that there may be a patient responsible charge related to this service. The patient expressed understanding and agreed to proceed.   History of Present Illness: Patient was evaluated by phone session.  He is taking his medication as prescribed.  Recently had physical and blood work.  His CBC and basic chemistry is normal.  He is in therapy with [name].  He is relieved that his son [name] is actually doing much better and there has been no recent hospitalization.  Patient told he had reversal of ostomy and he is not getting any ostomy bag.  He wished that his daughter also do well because she still goes ins and out and patient is still struggle with chronic pain issues with other people.  However he feels the current medicine is keeping him stable.  He denies any anger, mania, psychosis or any hallucination.  He denies any crying spells.  He feel the therapy is helping him.  He takes lorazepam and Ambien as prescribed along with Lamictal.  He has no rash, itching tremors or shakes.  His energy level is fair.  Some nights he has difficulty sleeping because of chronic back pain.   Past Psychiatric History:Reviewed. [facility]dep[facility] but[facility]. No h/opsychosis buth/o poor impulse control. In the past tookSeroquel,Cymbaltaand Remeron (increase depression)  Recent Results (from the past 2160 hour(s))  APTT     Status: Abnormal   Collection Time: 09/13/19  3:38 PM  Result Value Ref Range   aPTT 32.9 (H) 23.4  - 32.7 SEC  Basic metabolic panel     Status: Abnormal   Collection Time: 09/13/19  3:38 PM  Result Value Ref Range   Sodium 136 135 - 145 mEq/L   Potassium 3.9 3.5 - 5.1 mEq/L   Chloride 100 96 - 112 mEq/L   CO2 28 19 - 32 mEq/L   Glucose, Bld 135 (H) 70 - 99 mg/dL   BUN 18 6 - 23 mg/dL   Creatinine, Ser 1.02 0.40 - 1.50 mg/dL   GFR 73.12 >60.00 mL/min   Calcium 8.7 8.4 - 10.5 mg/dL  CBC with Differential/Platelet     Status: Abnormal   Collection Time: 09/13/19  3:38 PM  Result Value Ref Range   WBC 10.5 4.0 - 10.5 K/uL   RBC 5.61 4.22 - 5.81 Mil/uL   Hemoglobin 15.1 13.0 - 17.0 g/dL   HCT 46.5 39.0 - 52.0 %   MCV 82.9 78.0 - 100.0 fl   MCHC 32.5 30.0 - 36.0 g/dL   RDW 15.8 (H) 11.5 - 15.5 %   Platelets 347.0 150.0 - 400.0 K/uL   Neutrophils Relative % 75.1 43.0 - 77.0 %   Lymphocytes Relative 12.7 12.0 - 46.0 %   Monocytes Relative 10.0 3.0 - 12.0 %   Eosinophils Relative 1.1 0.0 - 5.0 %   Basophils Relative 1.1 0.0 - 3.0 %   Neutro Abs 7.9 (H) 1.4 - 7.7 K/uL   Lymphs Abs 1.3 0.7 - 4.0 K/uL   Monocytes Absolute 1.1 (H) 0.1 - 1.0 K/uL   Eosinophils Absolute 0.1 0.0 -  0.7 K/uL   Basophils Absolute 0.1 0.0 - 0.1 K/uL  Hepatic function panel     Status: Abnormal   Collection Time: 09/13/19  3:38 PM  Result Value Ref Range   Total Bilirubin 1.1 0.2 - 1.2 mg/dL   Bilirubin, Direct 0.2 0.0 - 0.3 mg/dL   Alkaline Phosphatase 79 39 - 117 U/L   AST 41 (H) 0 - 37 U/L   ALT 33 0 - 53 U/L   Total Protein 7.1 6.0 - 8.3 g/dL   Albumin 4.3 3.5 - 5.2 g/dL  Protime-INR     Status: None   Collection Time: 09/13/19  3:38 PM  Result Value Ref Range   INR 1.0 0.8 - 1.0 ratio   Prothrombin Time 11.1 9.6 - 13.1 sec      Psychiatric Specialty Exam: Physical Exam  Review of Systems  Musculoskeletal: Positive for back pain.    There were no vitals taken for this visit.There is no height or weight on file to calculate BMI.  General Appearance: NA  Eye Contact:  NA  Speech:  Clear  and Coherent  Volume:  Normal  Mood:  Anxious  Affect:  NA  Thought Process:  Goal Directed  Orientation:  Full (Time, Place, and Person)  Thought Content:  Rumination  Suicidal Thoughts:  No  Homicidal Thoughts:  No  Memory:  Immediate;   Good Recent;   Good Remote;   Good  Judgement:  Intact  Insight:  Present  Psychomotor Activity:  NA  Concentration:  Concentration: Good and Attention Span: Good  Recall:  Good  Fund of Knowledge:  Good  Language:  Good  Akathisia:  No  Handed:  Right  AIMS (if indicated):     Assets:  Communication Skills Desire for Improvement Housing Resilience  ADL's:  Intact  Cognition:  WNL  Sleep:   fair      Assessment and Plan: Bipolar disorder type I.  Generalized anxiety disorder.  I reviewed blood work results and current medication.  He is a stable on his medication.  He is in therapy with [name] and I encouraged to continue that.  I will continue Ambien 10 mg half to 1 tablet as needed and he like to have his refill called at [facility].  He also takes lorazepam 0.5 mg daily and second if needed and Lamictal 300 mg daily which he like to call into [facility].  He has no rash or any itching.  Recommended to call us back if any question or any concern.  Follow-up in 3 months.  Follow Up Instructions:    I discussed the assessment and treatment plan with the patient. The patient was provided an opportunity to ask questions and all were answered. The patient agreed with the plan and demonstrated an understanding of the instructions.   The patient was advised to call back or seek an in-person evaluation if the symptoms worsen or if the condition fails to improve as anticipated.  I provided 20 minutes of non-face-to-face time during this encounter.   Kathlee Nations, MD

## 2019-11-23 ENCOUNTER — Encounter (HOSPITAL_COMMUNITY): Payer: Self-pay | Admitting: Licensed Clinical Social Worker

## 2019-11-23 ENCOUNTER — Ambulatory Visit (INDEPENDENT_AMBULATORY_CARE_PROVIDER_SITE_OTHER): Payer: 59 | Admitting: Licensed Clinical Social Worker

## 2019-11-23 ENCOUNTER — Other Ambulatory Visit: Payer: Self-pay

## 2019-11-23 DIAGNOSIS — F319 Bipolar disorder, unspecified: Secondary | ICD-10-CM

## 2019-11-23 NOTE — Progress Notes (Signed)
Virtual Visit via Telephone Note  I connected with Touger Wolden Tawney on 11/23/2019 at 11:00 am EDT by phone as patient is uncomfortable to use video due to privacy issues. I verified that I am speaking with the correct person using two identifiers.I discussed the limitations of evaluation and management by telemedicine and the availability of in person appointments. The patient expressed understanding and agreed to proceed.  History of Present Illness:  Pt was referred by Dr. Adele Schilder for OP therapy for bipolar disorder.    Observations/Objective: Pt presents  depressed for his phone therapy session. Pt discussed his psychiatric symptoms and current life events. Patient reports he's having some depressive symptoms due to his current life events: physical ailments, chronic pain, family drama, children who are dependent, marital issues, no empathy from wife, monetary Doctor, general practice) issues.Used problem-solving techniques to assist patient with on-going life events.  Follow Up Instructions:  I discussed the assessment and treatment plan with the patient. The patient was provided an opportunity to ask questions and all were answered. The patient agreed with the plan and demonstrated an understanding of the instructions.   The patient was advised to call back or seek an in-person evaluation if the symptoms worsen or if the condition fails to improve as anticipated.  I provided 60 minutes of non-face-to-face time during this encounter.   Blaise Grieshaber S, LCAS

## 2019-11-24 ENCOUNTER — Telehealth: Payer: Self-pay

## 2019-11-24 NOTE — Telephone Encounter (Signed)
New message    The patient calling   Second COVID Pfizer vaccine 4.3.21  C/o joint achy, short of breathe when walking up stairs, no fever   Wanted to see MD only.   Asking for a call back from the [place]

## 2019-11-26 NOTE — Telephone Encounter (Signed)
Pt notified that these are common side effects from the vaccine, but to call back if by mid next week if he is not feeling better

## 2019-11-29 ENCOUNTER — Other Ambulatory Visit: Payer: Self-pay | Admitting: Internal Medicine

## 2019-11-30 ENCOUNTER — Other Ambulatory Visit: Payer: Self-pay

## 2019-11-30 ENCOUNTER — Encounter: Payer: Self-pay | Admitting: Internal Medicine

## 2019-11-30 ENCOUNTER — Ambulatory Visit (INDEPENDENT_AMBULATORY_CARE_PROVIDER_SITE_OTHER): Payer: 59 | Admitting: Internal Medicine

## 2019-11-30 VITALS — BP 128/82 | HR 93 | Temp 98.2°F | Ht 72.0 in

## 2019-11-30 DIAGNOSIS — I712 Thoracic aortic aneurysm, without rupture: Secondary | ICD-10-CM

## 2019-11-30 DIAGNOSIS — S3550XS Injury of unspecified iliac blood vessel(s), sequela: Secondary | ICD-10-CM

## 2019-11-30 DIAGNOSIS — E785 Hyperlipidemia, unspecified: Secondary | ICD-10-CM | POA: Diagnosis not present

## 2019-11-30 DIAGNOSIS — S3550XA Injury of unspecified iliac blood vessel(s), initial encounter: Secondary | ICD-10-CM | POA: Insufficient documentation

## 2019-11-30 DIAGNOSIS — R600 Localized edema: Secondary | ICD-10-CM | POA: Diagnosis not present

## 2019-11-30 DIAGNOSIS — I7121 Aneurysm of the ascending aorta, without rupture: Secondary | ICD-10-CM

## 2019-11-30 DIAGNOSIS — E291 Testicular hypofunction: Secondary | ICD-10-CM

## 2019-11-30 MED ORDER — ALBUTEROL SULFATE HFA 108 (90 BASE) MCG/ACT IN AERS: 2.0000 | INHALATION_SPRAY | Freq: Four times a day (QID) | RESPIRATORY_TRACT | 5 refills | Status: DC | PRN

## 2019-11-30 MED ORDER — SILDENAFIL CITRATE 20 MG PO TABS: ORAL_TABLET | ORAL | 11 refills | Status: DC

## 2019-11-30 NOTE — Assessment & Plan Note (Addendum)
Vasc surg f/u ref - Dr Cyndia Bent

## 2019-11-30 NOTE — Progress Notes (Signed)
Subjective:  Patient ID: Alex [name], male    DOB: 29-Dec-1955  Age: 64 y.o. MRN: [id number]  CC: No chief complaint on file.   HPI Alex [name] presents for fatigue C/o R foot swelling - seeing Dr Doran Durand, he had an MRI in 2019 C/o R temple mole is changing - Dr Denna Haggard C/o R thumb cyst - Dr Apolonio Schneiders C/o LBP considering RFA treatment F/u  acute hemorrhage of the left iliacus muscle  Outpatient Medications Prior to Visit  Medication Sig Dispense Refill  . acetaminophen (TYLENOL) 500 MG tablet Take 1,000 mg by mouth 2 (two) times daily as needed for headache.    [name] aspirin EC 81 MG tablet Take 1 tablet by mouth daily.    [name] atorvastatin (LIPITOR) 40 MG tablet TAKE 1 TABLET BY MOUTH EVERYDAY AT BEDTIME 90 tablet 3  . AXIRON 30 MG/ACT SOLN Place 2 Act onto the skin daily.    . Coenzyme Q10 (CO Q 10 PO) Take 1 capsule by mouth daily.     . Immune Globulin, Human, (HIZENTRA) 4 GM/20ML SOLN Inject into the skin once a week. wednesday    . LamoTRIgine 300 MG TB24 24 hour tablet Take 1 tablet (300 mg total) by mouth every morning. 90 tablet 0  . latanoprost (XALATAN) 0.005 % ophthalmic solution 1 DROP EACH EYE AT NIGHT    . levothyroxine (SYNTHROID) 150 MCG tablet TAKE 1 TABLET BY MOUTH EVERY DAY BEFORE BREAKFAST 90 tablet 3  . LORazepam (ATIVAN) 0.5 MG tablet Take 1 tab daily as needed and 1 tab at bed time 45 tablet 2  . LUTEIN PO Take 1 tablet by mouth daily.     [name] LYCOPENE PO Take by mouth.    . meloxicam (MOBIC) 15 MG tablet Take 1 tablet (15 mg total) by mouth daily. 30 tablet 5  . Multiple Vitamin (MULTI-VITAMINS) TABS Take by mouth.    . OMEGA-3 KRILL OIL PO Take 1 capsule by mouth daily.     [name] omeprazole (PRILOSEC) 20 MG capsule Take 20 mg by mouth every evening.    . Saw Palmetto, Serenoa repens, (SAW PALMETTO PO) Take by mouth.    . senna-docusate (SENOKOT S) 8.6-50 MG tablet Take 1 tablet by mouth 2 (two) times daily as needed. 60 tablet 11  . sildenafil (REVATIO) 20 MG  tablet TAKE 1 TO 5 TABLETS BY MOUTH DAILY AS NEEDED 30 tablet 0  . sucralfate (CARAFATE) 1 G tablet Take 1 g by mouth daily as needed (ACID REFLUX).   3  . tadalafil (ADCIRCA/CIALIS) 20 MG tablet TAKE 1 TABLET BY MOUTH ONCE DAILY AS NEEDED FOR ERECTILE DYSFUNCTION 30 tablet 1  . tamsulosin (FLOMAX) 0.4 MG CAPS capsule TAKE 1 CAPSULE BY MOUTH EVERY DAY 90 capsule 1  . telmisartan (MICARDIS) 80 MG tablet TAKE 1 TABLET BY MOUTH EVERY DAY 90 tablet 2  . traMADol (ULTRAM) 50 MG tablet Take 50-100 mg by mouth every 6 (six) hours as needed.    . zolpidem (AMBIEN) 10 MG tablet Take 1/2 tab at bed time and take other half if needed 30 tablet 1   No facility-administered medications prior to visit.    ROS: Review of Systems  Constitutional: Positive for fatigue. Negative for appetite change and unexpected weight change.  HENT: Negative for congestion, nosebleeds, sneezing, sore throat and trouble swallowing.   Eyes: Negative for itching and visual disturbance.  Respiratory: Negative for cough.   Cardiovascular: Negative for chest pain, palpitations and leg swelling.  Gastrointestinal: Negative for abdominal distention, blood in stool, diarrhea and nausea.  Genitourinary: Negative for frequency and hematuria.  Musculoskeletal: Positive for arthralgias, back pain and gait problem. Negative for joint swelling and neck pain.  Skin: Negative for rash.  Neurological: Negative for dizziness, tremors, speech difficulty and weakness.  Psychiatric/Behavioral: Negative for agitation, dysphoric mood, sleep disturbance and suicidal ideas. The patient is not nervous/anxious.     Objective:  BP 128/82 (BP Location: Left Arm, Patient Position: Sitting, Cuff Size: Large)   Pulse 93   Temp 98.2 F (36.8 C) (Oral)   Ht 6' (1.829 m)   SpO2 97%   BMI 25.11 kg/m   BP Readings from Last 3 Encounters:  11/30/19 128/82  09/13/19 130/82  09/06/19 (!) 144/86    Wt Readings from Last 3 Encounters:  09/13/19 185  lb 2 oz (84 kg)  09/06/19 183 lb (83 kg)  06/03/19 179 lb (81.2 kg)    Physical Exam Constitutional:      General: He is not in acute distress.    Appearance: He is well-developed.     Comments: NAD  Eyes:     Conjunctiva/sclera: Conjunctivae normal.     Pupils: Pupils are equal, round, and reactive to light.  Neck:     Thyroid: No thyromegaly.     Vascular: No JVD.  Cardiovascular:     Rate and Rhythm: Normal rate and regular rhythm.     Heart sounds: Normal heart sounds. No murmur. No friction rub. No gallop.   Pulmonary:     Effort: Pulmonary effort is normal. No respiratory distress.     Breath sounds: Normal breath sounds. No wheezing or rales.  Chest:     Chest wall: No tenderness.  Abdominal:     General: Bowel sounds are normal. There is no distension.     Palpations: Abdomen is soft. There is no mass.     Tenderness: There is no abdominal tenderness. There is no guarding or rebound.  Musculoskeletal:        General: No tenderness. Normal range of motion.     Cervical back: Normal range of motion.  Lymphadenopathy:     Cervical: No cervical adenopathy.  Skin:    General: Skin is warm and dry.     Findings: No rash.  Neurological:     Mental Status: He is alert and oriented to person, place, and time.     Cranial Nerves: No cranial nerve deficit.     Motor: Weakness present. No abnormal muscle tone.     Coordination: Coordination normal.     Gait: Gait normal.     Deep Tendon Reflexes: Reflexes are normal and symmetric.  Psychiatric:        Behavior: Behavior normal.        Thought Content: Thought content normal.        Judgment: Judgment normal.     Lab Results  Component Value Date   WBC 10.5 09/13/2019   HGB 15.1 09/13/2019   HCT 46.5 09/13/2019   PLT 347.0 09/13/2019   GLUCOSE 135 (H) 09/13/2019   CHOL 163 05/31/2019   TRIG 83 05/31/2019   HDL 75 05/31/2019   LDLDIRECT 59.0 02/02/2016   LDLCALC 73 05/31/2019   ALT 33 09/13/2019   AST 41 (H)  09/13/2019   NA 136 09/13/2019   K 3.9 09/13/2019   CL 100 09/13/2019   CREATININE 1.02 09/13/2019   BUN 18 09/13/2019   CO2 28 09/13/2019   TSH 3.00  01/26/2019   PSA 3.04 01/26/2019   INR 1.0 09/13/2019   HGBA1C 5.5 09/29/2018    CT ABDOMEN PELVIS W CONTRAST  Addendum Date: 09/02/2019   ADDENDUM REPORT: 09/02/2019 11:02 ADDENDUM: The original report was by Dr. Van Clines. The following addendum is by Dr. Van Clines: These results were called by telephone at the time of interpretation on 09/02/2019 at 11:00 am to provider [facility] , who verbally acknowledged these results. By report, the patient has also been followed at [facility] for a pancreatic cystic lesion, the only candidate structure I see on today's CT is a 4 mm faint lobulation along the anterior margin of the pancreatic body on image 26/2 which may could represent a tiny cyst. Electronically Signed   By: Van Clines [name]   On: 09/02/2019 11:02   Result Date: 09/02/2019 CLINICAL DATA:  Left flank and left lower quadrant abdominal pain starting earlier this week. EXAM: CT ABDOMEN AND PELVIS WITH CONTRAST TECHNIQUE: Multidetector CT imaging of the abdomen and pelvis was performed using the standard protocol following bolus administration of intravenous contrast. CONTRAST:  171mL ISOVUE-300 IOPAMIDOL (ISOVUE-300) INJECTION 61% COMPARISON:  Chest CT 06/13/2017; abdominal ultrasound 04/07/2015; MRI abdomen 06/22/2017; abdominal ultrasound 12/22/2017 FINDINGS: Lower chest: Descending thoracic aortic atherosclerotic calcification. Hepatobiliary: Several hepatic cysts are similar to the prior examination. There is a portacaval vascular malformation posteriorly in the right hepatic lobe with an associated varix measuring 2.7 by 2.0 cm on image 24/2, previously 1.5 by 1.2 cm on 06/22/2017, and measured at up to 1.9 cm in long axis on the interval Doppler ultrasound of 12/22/2017. No findings of cirrhosis. Gallbladder unremarkable.  Pancreas: Unremarkable Spleen: Unremarkable Adrenals/Urinary Tract: Two right renal calculi are identified, the larger in the mid kidney measuring 0.6 cm in long axis on image 47/3. Five left renal calculi are identified, the largest in the lower pole measuring 0.4 cm on image 60/3. No ureteral or bladder calculus identified. Bladder wall thickening is probably primarily from nondistention and appears uniform. Adrenal glands normal. Stomach/Bowel: Wall thickening in the lower rectum for example on image 81/2, nonspecific and possibly due to nondistention. There is sigmoid colon diverticulosis. Subtle stranding in the sigmoid colon mesentery is probably attributable to the inflammation along the adjacent retroperitoneum but could conceivably reflect low-grade diverticulitis. No diverticular abscess or extraluminal gas. Vascular/Lymphatic: Aortoiliac atherosclerotic vascular disease. Reproductive: Prostatomegaly. Dystrophic central prostate calcification. Other: Acute hemorrhage of the left iliacus muscle and also tracking in the posterior perirenal space, with stranding tracking along the retro renal fascia on the left, and along the left psoas muscle. There is edema tracking in the lower retroperitoneum and over to the right side in the upper pelvis, as well as mild presacral edema. There is edema along the left transversalis fascia adjacent to the iliacus muscle, and low-grade edema extending around anteriorly to the space of Retzius, and along the margins of the perirectal space. The posterior perirenal space hematoma measures about 8.6 by 4.0 by 7.3 cm (volume = 130 cm^3), but this does not include the hematoma component in the left iliacus muscle. I do not see any compelling findings of active extravasation. Stranding around the distal iliopsoas muscle and tendon extend down to the lesser trochanter insertion site. Musculoskeletal: Old healed fractures of both tenth ribs posterolaterally. Prior posterolateral  rod and pedicle screw fixation at L3-4. Mild levoconvex lumbar scoliosis. IMPRESSION: 1. Acute hemorrhage of the left iliacus muscle and tracking in the left posterior perirenal space, with associated edema and stranding  tracking in the retroperitoneum, left greater than right pelvic sidewalls, and presacral region. The component of hematoma in the posterior perirenal space on the left is about 130 cubic cm, but this does not include the component of hematoma within the iliacus muscle. No active extravasation identified. 2. Bilateral nonobstructive nephrolithiasis. 3. Sigmoid colon diverticulosis. Subtle stranding in the sigmoid colon mesentery is probably attributable to the inflammation along the adjacent retroperitoneum but could conceivably reflect low-grade diverticulitis. 4. 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 Current measurement 2.7 by 2.0 cm, previously 1.5 by 1.2 cm on 06/22/2017. Such lesions, especially when larger, can lead to complications such as hepatic encephalopathy. If the patient develops associated symptoms, then surgical or percutaneous treatment would be recommended. In the absence of symptoms, surveillance by ultrasound every 6 months is recommended to track in the further growth. 5. Wall thickening in the lower rectum is technically nonspecific, but probably from nondistention. 6. Prostatomegaly. 7. Aortic Atherosclerosis (ICD10-I70.0). Radiology assistant personnel have been notified to put me in telephone contact with the referring physician or the referring physician's clinical representative in order to discuss these findings. Once this communication is established I will issue an addendum to this report for documentation purposes. Electronically Signed: By: Van Clines [name] On: 09/02/2019 10:32    Assessment & Plan:   There are no diagnoses linked to this encounter.   No orders of the defined types were  placed in this encounter.    Follow-up: No follow-ups on file.  Walker Kehr, MD

## 2019-11-30 NOTE — Assessment & Plan Note (Signed)
F/u  acute hemorrhage of the left iliacus muscle Will consult Dr Cyndia Bent

## 2019-11-30 NOTE — Assessment & Plan Note (Signed)
S/p Podiatry eval MRI - ok Vasc surg ref - Dr Cyndia Bent Compression sock

## 2019-12-01 ENCOUNTER — Encounter: Payer: Self-pay | Admitting: Internal Medicine

## 2019-12-06 ENCOUNTER — Other Ambulatory Visit: Payer: Self-pay

## 2019-12-06 ENCOUNTER — Ambulatory Visit (INDEPENDENT_AMBULATORY_CARE_PROVIDER_SITE_OTHER): Payer: 59 | Admitting: Licensed Clinical Social Worker

## 2019-12-06 ENCOUNTER — Encounter (HOSPITAL_COMMUNITY): Payer: Self-pay | Admitting: Licensed Clinical Social Worker

## 2019-12-06 DIAGNOSIS — F319 Bipolar disorder, unspecified: Secondary | ICD-10-CM

## 2019-12-06 NOTE — Progress Notes (Signed)
Virtual Visit via Telephone Note  I connected with Valen Landwehr Minchew on 11/23/2019 at 11:00 am EDT by phone as patient is uncomfortable to use video due to privacy issues. I verified that I am speaking with the correct person using two identifiers.I discussed the limitations of evaluation and management by telemedicine and the availability of in person appointments. The patient expressed understanding and agreed to proceed.  History of Present Illness:  Pt was referred by Dr. Adele Schilder for OP therapy for bipolar disorder.    Observations/Objective: Pt presents  depressed for his phone therapy session. Pt discussed his psychiatric symptoms and current life events. Patient reports he continues having some depressive symptoms due to his current life events: physical ailments, chronic pain, family drama, children who are dependent, marital issues,  monetary Doctor, general practice) issues. Discussed the importance of change in therapy,his desire to change. Educated patient on his dysfunctional relationship patterns, irrational beliefs and self-sabotaging behaviors. Assisted patient in replacing them with a more positive, conscious and proactive mode of operation that leads to greater happiness, wellness and success.       Follow Up Instructions:  I discussed the assessment and treatment plan with the patient. The patient was provided an opportunity to ask questions and all were answered. The patient agreed with the plan and demonstrated an understanding of the instructions.   The patient was advised to call back or seek an in-person evaluation if the symptoms worsen or if the condition fails to improve as anticipated.  I provided 60 minutes of non-face-to-face time during this encounter.   Hulon Ferron S, LCAS

## 2019-12-07 ENCOUNTER — Telehealth: Payer: Self-pay

## 2019-12-07 NOTE — Telephone Encounter (Signed)
Pt stated he had labs done at [facility]. Results were faxed and given to PCP to review

## 2019-12-07 NOTE — Telephone Encounter (Signed)
New message    The patient calling for test results.   

## 2019-12-08 ENCOUNTER — Encounter: Payer: Self-pay | Admitting: Surgery

## 2019-12-08 ENCOUNTER — Ambulatory Visit: Payer: 59 | Admitting: Surgery

## 2019-12-08 ENCOUNTER — Other Ambulatory Visit: Payer: Self-pay

## 2019-12-08 VITALS — BP 146/97 | HR 85 | Temp 98.2°F | Resp 16 | Ht 72.0 in | Wt 184.0 lb

## 2019-12-08 DIAGNOSIS — I712 Thoracic aortic aneurysm, without rupture, unspecified: Secondary | ICD-10-CM

## 2019-12-08 NOTE — Progress Notes (Signed)
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  It has been relatively stable at that time and was noted to be 3.7 cm in 2007.  His recent history is significant for development of a spontaneous hemorrhage in the left iliac this muscle in January 2021 causing left flank and abdominal pain.  This resolved without incident.  Current Outpatient Medications  Medication Sig Dispense Refill  . acetaminophen (TYLENOL) 500 MG tablet Take 1,000 mg by mouth 2 (two) times daily as needed for headache.    . albuterol (PROAIR HFA) 108 (90 Base) MCG/ACT inhaler Inhale 2 puffs into the lungs every 6 (six) hours as needed for wheezing or shortness of breath. 18 g 5  . aspirin EC 81 MG tablet Take 1 tablet by mouth daily.    [name] atorvastatin (LIPITOR) 40 MG tablet TAKE 1 TABLET BY MOUTH EVERYDAY AT BEDTIME 90 tablet 3  . AXIRON 30 MG/ACT SOLN Place 2 Act onto the skin daily.    . Coenzyme Q10 (CO Q 10 PO) Take 1 capsule by mouth daily.     . Immune Globulin, Human, (HIZENTRA) 4 GM/20ML SOLN Inject into the skin once a week. wednesday    . LamoTRIgine 300 MG TB24 24 hour tablet Take 1 tablet (300 mg total) by mouth every morning. 90 tablet 0  . latanoprost (XALATAN) 0.005 % ophthalmic solution 1 DROP EACH EYE AT NIGHT    . levothyroxine (SYNTHROID) 150 MCG tablet TAKE 1 TABLET BY MOUTH EVERY DAY BEFORE BREAKFAST 90 tablet 3  . LORazepam (ATIVAN) 0.5 MG tablet Take 1 tab daily as needed and 1 tab at bed time 45 tablet 2  . LUTEIN PO Take 1 tablet by mouth daily.     [name] LYCOPENE PO Take by mouth.    . meloxicam (MOBIC) 15 MG tablet Take 1 tablet (15 mg total) by mouth daily. (Patient taking differently: Take 15 mg by mouth daily as needed. ) 30 tablet 5  . Multiple Vitamin (MULTI-VITAMINS) TABS Take by mouth.    . OMEGA-3 KRILL OIL PO Take 1 capsule by mouth daily.     [name] omeprazole (PRILOSEC) 20 MG capsule Take 20  mg by mouth every evening.    . sildenafil (REVATIO) 20 MG tablet TAKE 1 po qd 30 tablet 11  . sucralfate (CARAFATE) 1 G tablet Take 1 g by mouth daily as needed (ACID REFLUX).   3  . tadalafil (ADCIRCA/CIALIS) 20 MG tablet TAKE 1 TABLET BY MOUTH ONCE DAILY AS NEEDED FOR ERECTILE DYSFUNCTION 30 tablet 1  . tamsulosin (FLOMAX) 0.4 MG CAPS capsule TAKE 1 CAPSULE BY MOUTH EVERY DAY (Patient taking differently: daily as needed. ) 90 capsule 1  . telmisartan (MICARDIS) 80 MG tablet TAKE 1 TABLET BY MOUTH EVERY DAY 90 tablet 2  . traMADol (ULTRAM) 50 MG tablet Take 50-100 mg by mouth every 6 (six) hours as needed.    . zolpidem (AMBIEN) 10 MG tablet Take 1/2 tab at bed time and take other half if needed 30 tablet 1   No current facility-administered medications for this visit.     Physical Exam: BP (!) 146/97 (BP Location: Right Arm, Patient Position: Sitting, Cuff Size: Normal)   Pulse 85   Temp 98.2 F (36.8 C)   Resp 16   Ht 6' (1.829 m)   Wt 184 lb (83.5 kg)   SpO2  96% Comment: RA  BMI 24.95 kg/m  He looks well. Cardiac exam shows a regular rate and rhythm with normal heart sounds.  There is no murmur. Lungs are clear.  Diagnostic Tests:  CLINICAL DATA:  Evaluate thoracic aortic aneurysm  EXAM: MRA CHEST WITH CONTRAST  TECHNIQUE: Angiographic images of the chest were obtained using MRA technique with intravenous contrast.  CONTRAST:  3mL MULTIHANCE GADOBENATE DIMEGLUMINE 529 MG/ML IV SOLN  Creatinine was obtained on site at [facility] at [address]  Results: Creatinine 1.1 mg/dL.  COMPARISON:  Chest MRA - 06/13/2019; chest CT-06/13/2017; 03/19/2016; 10/28/2014  FINDINGS: Vascular Findings:  Stable fusiform aneurysmal dilatation of the ascending thoracic aorta with measurements as follows. The thoracic aorta tapers to a normal caliber at the level of the aortic arch.  No evidence of thoracic aortic dissection or periaortic stranding  on this nongated examination.  Conventional configuration of the aortic arch. The branch vessels of the aortic arch appear widely patent throughout their imaged courses. Note is made of a small ductus diverticulum.  Cardiomegaly.  No pericardial effusion.  Although this examination was not tailored for the evaluation the pulmonary arteries, there are no discrete filling defects within the central pulmonary arterial tree to suggest central pulmonary embolism. Normal caliber of the main pulmonary artery.  -------------------------------------------------------------  Thoracic aortic measurements:  Sinotubular junction  40 mm as measured in greatest oblique short axis sagittal dimension.  Proximal ascending aorta  43 mm as measured in greatest oblique short axis axial dimension at the level of the main pulmonary artery (image 56, series 12) and approximately 43 mm in greatest oblique short axis sagittal diameter (sagittal image 34, series 9), unchanged compared to the 10/2014 examination, previously, 42 mm and 43 mm in diameter respectively  Aortic arch aorta  25 mm as measured in greatest oblique short axis sagittal dimension.  Proximal descending thoracic aorta  26 mm as measured in greatest oblique short axis axial dimension at the level of the main pulmonary artery.  Distal descending thoracic aorta  22 mm as measured in greatest oblique short axis axial dimension at the level of the diaphragmatic hiatus.  Review of the MIP images confirms the above findings.  -------------------------------------------------------------  Non-Vascular Findings:  Mediastinum/Lymph Nodes: No bulky mediastinal, hilar axillary lymphadenopathy.  Lungs/Pleura: No focal airspace opacities.  No pleural effusion  Upper abdomen: Limited evaluation of the upper abdomen demonstrates a punctate (approximately 1 cm) T2 intense nonenhancing cyst within the dome of the  right lobe of the liver (image 48, series 3), grossly unchanged compared to the 06/23/2017 chest CT.  Musculoskeletal: No acute osseous abnormalities.  IMPRESSION: Stable uncomplicated mild fusiform aneurysmal dilatation of the ascending thoracic aorta measuring 43 mm in diameter, grossly unchanged compared to the 10/2014 examination. Aortic aneurysm NOS (ICD10-I71.9).   Electronically Signed   By: Sandi Mariscal [name]   On: 07/21/2019 19:13  Impression:  MRA of the chest in December 2020 showed the fusiform ascending aortic aneurysm to have a maximum diameter of 4.3 cm.  This is slightly larger than it was in 2018 when the measurement was 4.1 cm.  It was 3.7 cm in 2007.  This is a fairly small change over many years and still well below the surgical threshold of 5.5 cm.  I reviewed the MRI images with him and answered all of his questions.  I stressed the importance of good blood pressure control and preventing further enlargement and acute aortic dissection.  Plan:  I have  recommended that he have a follow-up CTA of the chest in 1 year.  I spent 20 minutes performing this established patient evaluation and > 50% of this time was spent face to face counseling and coordinating the care of this patient's aortic aneurysm.    Gaye Pollack, MD [facility] [phone]

## 2019-12-09 ENCOUNTER — Encounter: Payer: Self-pay | Admitting: *Deleted

## 2019-12-13 ENCOUNTER — Telehealth: Payer: Self-pay | Admitting: Internal Medicine

## 2019-12-13 ENCOUNTER — Ambulatory Visit: Payer: 59 | Admitting: Dermatology

## 2019-12-13 NOTE — Telephone Encounter (Signed)
Please advise 

## 2019-12-13 NOTE — Telephone Encounter (Signed)
    Patient requesting prescription for thrush  ([facility]) Patient states thrush caused from using albuterol (PROAIR HFA) 108 (90 Base) MCG/ACT inhaler

## 2019-12-15 MED ORDER — CLOTRIMAZOLE 10 MG MT TROC: 10.0000 mg | Freq: Every day | OROMUCOSAL | 0 refills | Status: DC

## 2019-12-15 NOTE — Telephone Encounter (Signed)
Pt notified and RX resent to [facility]

## 2019-12-15 NOTE — Telephone Encounter (Signed)
Alex [name]. Doubt Proair caused thrush Thx

## 2019-12-20 ENCOUNTER — Other Ambulatory Visit: Payer: Self-pay

## 2019-12-20 ENCOUNTER — Ambulatory Visit (INDEPENDENT_AMBULATORY_CARE_PROVIDER_SITE_OTHER): Payer: 59 | Admitting: Licensed Clinical Social Worker

## 2019-12-20 ENCOUNTER — Ambulatory Visit (HOSPITAL_COMMUNITY): Payer: 59 | Admitting: Licensed Clinical Social Worker

## 2019-12-20 ENCOUNTER — Encounter (HOSPITAL_COMMUNITY): Payer: Self-pay | Admitting: Licensed Clinical Social Worker

## 2019-12-20 DIAGNOSIS — F411 Generalized anxiety disorder: Secondary | ICD-10-CM

## 2019-12-20 DIAGNOSIS — F319 Bipolar disorder, unspecified: Secondary | ICD-10-CM

## 2019-12-20 NOTE — Progress Notes (Signed)
Virtual Visit Note  I connected with Alex [name] on 12/20/2019 at 11:00 am EDT by video virtual visit. I verified that I am speaking with the correct person using two identifiers.I discussed the limitations of evaluation and management by telemedicine and the availability of in person appointments. The patient expressed understanding and agreed to proceed.  History of Present Illness:  Pt was referred by Dr. Adele Schilder for OP therapy for bipolar disorder.    Observations/Objective: Pt presents  depressed for virtual video therapy session. Pt discussed his psychiatric symptoms and current life events. Patient reports he continues having some depressive symptoms due to his current life events: health issues, chronic pain, family drama, children who are dependent, marital issues,  monetary Doctor, general practice) issues. [name] provided psychoeducation on dysfunctional family traits and roles. [name] provided psychoeducation for implementing skills for managing his stress, solving his daily problems, and resolve conflicts effectively.      Follow Up Instructions:  I discussed the assessment and treatment plan with the patient. The patient was provided an opportunity to ask questions and all were answered. The patient agreed with the plan and demonstrated an understanding of the instructions.   The patient was advised to call back or seek an in-person evaluation if the symptoms worsen or if the condition fails to improve as anticipated.  I provided 60 minutes of non-face-to-face time during this encounter.   Keerthana Vanrossum S, LCAS

## 2020-01-03 ENCOUNTER — Other Ambulatory Visit: Payer: Self-pay

## 2020-01-03 ENCOUNTER — Ambulatory Visit (INDEPENDENT_AMBULATORY_CARE_PROVIDER_SITE_OTHER): Payer: 59 | Admitting: Licensed Clinical Social Worker

## 2020-01-03 ENCOUNTER — Encounter (HOSPITAL_COMMUNITY): Payer: Self-pay | Admitting: Licensed Clinical Social Worker

## 2020-01-03 ENCOUNTER — Ambulatory Visit (HOSPITAL_COMMUNITY): Payer: 59 | Admitting: Licensed Clinical Social Worker

## 2020-01-03 DIAGNOSIS — F319 Bipolar disorder, unspecified: Secondary | ICD-10-CM | POA: Diagnosis not present

## 2020-01-03 NOTE — Progress Notes (Signed)
Virtual Visit Note  I connected with Alex [name] on 01/03/2020 at 3:00pm EDT by video virtual visit. I verified that I am speaking with the correct person using two identifiers.I discussed the limitations of evaluation and management by telemedicine and the availability of in person appointments. The patient expressed understanding and agreed to proceed.  History of Present Illness:  Pt was referred by Dr. Adele Schilder for OP therapy for bipolar disorder.    Observations/Objective: Pt presents  depressed for virtual video therapy session. Pt discussed his psychiatric symptoms and current life events. Patient reports he continues have depressive symptoms due to his most recent continuing stressor: his son who is a habitual self mutilation and suicide attempts. Patient and his wife are "at wit's end" in knowing what to do about their son. They continue to enable his behavior and provide a safe place to land. Explored with patient information about bipolar (his dx) (borderline personality) his son's dx. Suggested patient research on his own symptoms of his son's dx to give him a better understanding of his son's dx. Also recommended [facility] and [facility] for patient to look for support groups.   Follow Up Instructions:  I discussed the assessment and treatment plan with the patient. The patient was provided an opportunity to ask questions and all were answered. The patient agreed with the plan and demonstrated an understanding of the instructions.   The patient was advised to call back or seek an in-person evaluation if the symptoms worsen or if the condition fails to improve as anticipated.  I provided 60 minutes of non-face-to-face time during this encounter.   Breck Hollinger S, LCAS

## 2020-01-04 ENCOUNTER — Encounter: Payer: Self-pay | Admitting: Dermatology

## 2020-01-04 ENCOUNTER — Ambulatory Visit: Payer: 59 | Admitting: Dermatology

## 2020-01-04 ENCOUNTER — Other Ambulatory Visit: Payer: Self-pay

## 2020-01-04 DIAGNOSIS — Z1283 Encounter for screening for malignant neoplasm of skin: Secondary | ICD-10-CM | POA: Diagnosis not present

## 2020-01-04 DIAGNOSIS — D485 Neoplasm of uncertain behavior of skin: Secondary | ICD-10-CM | POA: Diagnosis not present

## 2020-01-04 DIAGNOSIS — L570 Actinic keratosis: Secondary | ICD-10-CM

## 2020-01-04 DIAGNOSIS — Z85828 Personal history of other malignant neoplasm of skin: Secondary | ICD-10-CM

## 2020-01-04 NOTE — Patient Instructions (Signed)

## 2020-01-05 ENCOUNTER — Encounter: Payer: Self-pay | Admitting: Dermatology

## 2020-01-17 ENCOUNTER — Ambulatory Visit (INDEPENDENT_AMBULATORY_CARE_PROVIDER_SITE_OTHER): Payer: No Typology Code available for payment source | Admitting: Licensed Clinical Social Worker

## 2020-01-17 ENCOUNTER — Encounter (HOSPITAL_COMMUNITY): Payer: Self-pay | Admitting: Licensed Clinical Social Worker

## 2020-01-17 ENCOUNTER — Other Ambulatory Visit: Payer: Self-pay

## 2020-01-17 DIAGNOSIS — F319 Bipolar disorder, unspecified: Secondary | ICD-10-CM | POA: Diagnosis not present
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
## 2020-01-19 ENCOUNTER — Telehealth: Payer: Self-pay | Admitting: Internal Medicine

## 2020-01-19 ENCOUNTER — Other Ambulatory Visit: Payer: Self-pay | Admitting: Internal Medicine

## 2020-01-19 NOTE — Telephone Encounter (Signed)
New Message:   Pt is wanting [place] to request his medical records from [place] for a doctor he is transferring to in [place]. Pt is trying to prevent from paying for the fee to get the medical records. I advise the pt if [facility] is wanting his records then they can request them from [facility]. Please advise.

## 2020-01-25 ENCOUNTER — Other Ambulatory Visit: Payer: Self-pay

## 2020-01-25 ENCOUNTER — Ambulatory Visit: Payer: No Typology Code available for payment source | Admitting: Internal Medicine

## 2020-01-25 ENCOUNTER — Encounter: Payer: Self-pay | Admitting: Internal Medicine

## 2020-01-25 DIAGNOSIS — R109 Unspecified abdominal pain: Secondary | ICD-10-CM | POA: Insufficient documentation

## 2020-01-25 DIAGNOSIS — K5792 Diverticulitis of intestine, part unspecified, without perforation or abscess without bleeding: Secondary | ICD-10-CM

## 2020-01-25 DIAGNOSIS — R1032 Left lower quadrant pain: Secondary | ICD-10-CM | POA: Diagnosis not present

## 2020-01-25 MED ORDER — CHARCOAL ACTIVATED 260 MG PO CAPS: 1.0000 | ORAL_CAPSULE | Freq: Four times a day (QID) | ORAL | 3 refills | Status: DC | PRN

## 2020-01-25 MED ORDER — HYOSCYAMINE SULFATE 0.125 MG PO TABS
0.1250 mg | ORAL_TABLET | ORAL | 3 refills | Status: DC | PRN
Start: 2020-01-25 — End: 2020-06-08

## 2020-01-25 MED ORDER — AMOXICILLIN-POT CLAVULANATE 875-125 MG PO TABS
1.0000 | ORAL_TABLET | Freq: Two times a day (BID) | ORAL | 0 refills | Status: DC
Start: 2020-01-25 — End: 2020-02-22

## 2020-01-25 NOTE — Assessment & Plan Note (Addendum)
Diverticulitis - probable. Augmentin Low residue diet CT Jan 2021

## 2020-01-25 NOTE — Progress Notes (Signed)
Subjective:  Patient ID: Alex [name], male    DOB: [birth year]  Age: 64 y.o. MRN: [id number]  CC: No chief complaint on file.   HPI Herberth Deharo Westling presents for a lot of stress at home w/Justin. C/o dyslipidemia, anxiety C/o "gassy ball" 15-30 min after he eats. No diarrhea, Passing gas, formed stool x 2 weeks. No new meds. L iliacus hematoma - better  Outpatient Medications Prior to Visit  Medication Sig Dispense Refill  . acetaminophen (TYLENOL) 500 MG tablet Take 1,000 mg by mouth 2 (two) times daily as needed for headache.    . albuterol (PROAIR HFA) 108 (90 Base) MCG/ACT inhaler Inhale 2 puffs into the lungs every 6 (six) hours as needed for wheezing or shortness of breath. 18 g 5  . aspirin EC 81 MG tablet Take 1 tablet by mouth daily.    [name] atorvastatin (LIPITOR) 40 MG tablet TAKE 1 TABLET BY MOUTH EVERYDAY AT BEDTIME 90 tablet 3  . AXIRON 30 MG/ACT SOLN Place 2 Act onto the skin daily.    . clotrimazole (MYCELEX) 10 MG troche Take 1 tablet (10 mg total) by mouth 5 (five) times daily. 35 tablet 0  . Coenzyme Q10 (CO Q 10 PO) Take 1 capsule by mouth daily.     . Immune Globulin, Human, (HIZENTRA) 4 GM/20ML SOLN Inject into the skin once a week. wednesday    . LamoTRIgine 300 MG TB24 24 hour tablet Take 1 tablet (300 mg total) by mouth every morning. 90 tablet 0  . latanoprost (XALATAN) 0.005 % ophthalmic solution 1 DROP EACH EYE AT NIGHT    . levothyroxine (SYNTHROID) 150 MCG tablet TAKE 1 TABLET BY MOUTH EVERY DAY BEFORE BREAKFAST 90 tablet 3  . LORazepam (ATIVAN) 0.5 MG tablet Take 1 tab daily as needed and 1 tab at bed time 45 tablet 2  . LUTEIN PO Take 1 tablet by mouth daily.     [name] LYCOPENE PO Take by mouth.    . meloxicam (MOBIC) 15 MG tablet Take 1 tablet (15 mg total) by mouth daily. (Patient taking differently: Take 15 mg by mouth daily as needed. ) 30 tablet 5  . Multiple Vitamin (MULTI-VITAMINS) TABS Take by mouth.    . OMEGA-3 KRILL OIL PO Take 1 capsule by  mouth daily.     [name] omeprazole (PRILOSEC) 20 MG capsule Take 20 mg by mouth every evening.    . sildenafil (REVATIO) 20 MG tablet TAKE 1 po qd 30 tablet 11  . sucralfate (CARAFATE) 1 G tablet Take 1 g by mouth daily as needed (ACID REFLUX).   3  . tadalafil (ADCIRCA/CIALIS) 20 MG tablet TAKE 1 TABLET BY MOUTH ONCE DAILY AS NEEDED FOR ERECTILE DYSFUNCTION 30 tablet 1  . tamsulosin (FLOMAX) 0.4 MG CAPS capsule TAKE 1 CAPSULE BY MOUTH EVERY DAY (Patient taking differently: daily as needed. ) 90 capsule 1  . telmisartan (MICARDIS) 80 MG tablet TAKE 1 TABLET BY MOUTH EVERY DAY 90 tablet 2  . timolol (TIMOPTIC) 0.5 % ophthalmic solution timolol maleate 0.5 % eye drops  INSTILL 1 DROP INTO RIGHT EYE TWICE A DAY    . traMADol (ULTRAM) 50 MG tablet Take 50-100 mg by mouth every 6 (six) hours as needed.    . zolpidem (AMBIEN) 10 MG tablet Take 1/2 tab at bed time and take other half if needed 30 tablet 1   No facility-administered medications prior to visit.    ROS: Review of Systems  Constitutional: Positive for  fatigue. Negative for appetite change and unexpected weight change.  HENT: Negative for congestion, nosebleeds, sneezing, sore throat and trouble swallowing.   Eyes: Negative for itching and visual disturbance.  Respiratory: Negative for cough and shortness of breath.   Cardiovascular: Negative for chest pain, palpitations and leg swelling.  Gastrointestinal: Positive for abdominal distention and abdominal pain. Negative for blood in stool, constipation, diarrhea, nausea and vomiting.  Genitourinary: Negative for frequency and hematuria.  Musculoskeletal: Positive for arthralgias. Negative for back pain, gait problem, joint swelling and neck pain.  Skin: Negative for rash.  Neurological: Negative for dizziness, tremors, speech difficulty and weakness.  Psychiatric/Behavioral: Negative for agitation, dysphoric mood and sleep disturbance. The patient is nervous/anxious.     Objective:  BP  (!) 144/98 (BP Location: Right Arm, Patient Position: Sitting, Cuff Size: Normal)   Pulse 73   Temp 98.1 F (36.7 C) (Oral)   Ht 6' (1.829 m)   Wt 182 lb (82.6 kg)   SpO2 97%   BMI 24.68 kg/m   BP Readings from Last 3 Encounters:  01/25/20 (!) 144/98  12/08/19 (!) 146/97  11/30/19 128/82    Wt Readings from Last 3 Encounters:  01/25/20 182 lb (82.6 kg)  12/08/19 184 lb (83.5 kg)  09/13/19 185 lb 2 oz (84 kg)    Physical Exam  Lab Results  Component Value Date   WBC 10.5 09/13/2019   HGB 15.1 09/13/2019   HCT 46.5 09/13/2019   PLT 347.0 09/13/2019   GLUCOSE 135 (H) 09/13/2019   CHOL 163 05/31/2019   TRIG 83 05/31/2019   HDL 75 05/31/2019   LDLDIRECT 59.0 02/02/2016   LDLCALC 73 05/31/2019   ALT 33 09/13/2019   AST 41 (H) 09/13/2019   NA 136 09/13/2019   K 3.9 09/13/2019   CL 100 09/13/2019   CREATININE 1.02 09/13/2019   BUN 18 09/13/2019   CO2 28 09/13/2019   TSH 3.00 01/26/2019   PSA 3.04 01/26/2019   INR 1.0 09/13/2019   HGBA1C 5.5 09/29/2018    CT ABDOMEN PELVIS W CONTRAST  Addendum Date: 09/02/2019   ADDENDUM REPORT: 09/02/2019 11:02 ADDENDUM: The original report was by Dr. Van Clines. The following addendum is by Dr. Van Clines: These results were called by telephone at the time of interpretation on 09/02/2019 at 11:00 am to provider [facility] , who verbally acknowledged these results. By report, the patient has also been followed at [facility] for a pancreatic cystic lesion, the only candidate structure I see on today's CT is a 4 mm faint lobulation along the anterior margin of the pancreatic body on image 26/2 which may could represent a tiny cyst. Electronically Signed   By: Van Clines [name]   On: 09/02/2019 11:02   Result Date: 09/02/2019 CLINICAL DATA:  Left flank and left lower quadrant abdominal pain starting earlier this week. EXAM: CT ABDOMEN AND PELVIS WITH CONTRAST TECHNIQUE: Multidetector CT imaging of the abdomen and pelvis was  performed using the standard protocol following bolus administration of intravenous contrast. CONTRAST:  146mL ISOVUE-300 IOPAMIDOL (ISOVUE-300) INJECTION 61% COMPARISON:  Chest CT 06/13/2017; abdominal ultrasound 04/07/2015; MRI abdomen 06/22/2017; abdominal ultrasound 12/22/2017 FINDINGS: Lower chest: Descending thoracic aortic atherosclerotic calcification. Hepatobiliary: Several hepatic cysts are similar to the prior examination. There is a portacaval vascular malformation posteriorly in the right hepatic lobe with an associated varix measuring 2.7 by 2.0 cm on image 24/2, previously 1.5 by 1.2 cm on 06/22/2017, and measured at up to 1.9 cm in long axis  on the interval Doppler ultrasound of 12/22/2017. No findings of cirrhosis. Gallbladder unremarkable. Pancreas: Unremarkable Spleen: Unremarkable Adrenals/Urinary Tract: Two right renal calculi are identified, the larger in the mid kidney measuring 0.6 cm in long axis on image 47/3. Five left renal calculi are identified, the largest in the lower pole measuring 0.4 cm on image 60/3. No ureteral or bladder calculus identified. Bladder wall thickening is probably primarily from nondistention and appears uniform. Adrenal glands normal. Stomach/Bowel: Wall thickening in the lower rectum for example on image 81/2, nonspecific and possibly due to nondistention. There is sigmoid colon diverticulosis. Subtle stranding in the sigmoid colon mesentery is probably attributable to the inflammation along the adjacent retroperitoneum but could conceivably reflect low-grade diverticulitis. No diverticular abscess or extraluminal gas. Vascular/Lymphatic: Aortoiliac atherosclerotic vascular disease. Reproductive: Prostatomegaly. Dystrophic central prostate calcification. Other: Acute hemorrhage of the left iliacus muscle and also tracking in the posterior perirenal space, with stranding tracking along the retro renal fascia on the left, and along the left psoas muscle. There is  edema tracking in the lower retroperitoneum and over to the right side in the upper pelvis, as well as mild presacral edema. There is edema along the left transversalis fascia adjacent to the iliacus muscle, and low-grade edema extending around anteriorly to the space of Retzius, and along the margins of the perirectal space. The posterior perirenal space hematoma measures about 8.6 by 4.0 by 7.3 cm (volume = 130 cm^3), but this does not include the hematoma component in the left iliacus muscle. I do not see any compelling findings of active extravasation. Stranding around the distal iliopsoas muscle and tendon extend down to the lesser trochanter insertion site. Musculoskeletal: Old healed fractures of both tenth ribs posterolaterally. Prior posterolateral rod and pedicle screw fixation at L3-4. Mild levoconvex lumbar scoliosis. IMPRESSION: 1. Acute hemorrhage of the left iliacus muscle and tracking in the left posterior perirenal space, with associated edema and stranding tracking in the retroperitoneum, left greater than right pelvic sidewalls, and presacral region. The component of hematoma in the posterior perirenal space on the left is about 130 cubic cm, but this does not include the component of hematoma within the iliacus muscle. No active extravasation identified. 2. Bilateral nonobstructive nephrolithiasis. 3. Sigmoid colon diverticulosis. Subtle stranding in the sigmoid colon mesentery is probably attributable to the inflammation along the adjacent retroperitoneum but could conceivably reflect low-grade diverticulitis. 4. 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 Current measurement 2.7 by 2.0 cm, previously 1.5 by 1.2 cm on 06/22/2017. Such lesions, especially when larger, can lead to complications such as hepatic encephalopathy. If the patient develops associated symptoms, then surgical or percutaneous treatment would be recommended. In the  absence of symptoms, surveillance by ultrasound every 6 months is recommended to track in the further growth. 5. Wall thickening in the lower rectum is technically nonspecific, but probably from nondistention. 6. Prostatomegaly. 7. Aortic Atherosclerosis (ICD10-I70.0). Radiology assistant personnel have been notified to put me in telephone contact with the referring physician or the referring physician's clinical representative in order to discuss these findings. Once this communication is established I will issue an addendum to this report for documentation purposes. Electronically Signed: By: Van Clines [name] On: 09/02/2019 10:32    Assessment & Plan:    Walker Kehr, MD

## 2020-01-25 NOTE — Patient Instructions (Signed)
Diverticulitis  Diverticulitis is when small pockets in your large intestine (colon) get infected or swollen. This causes stomach pain and watery poop (diarrhea). These pouches are called diverticula. They form in people who have a condition called diverticulosis. Follow these instructions at home: Medicines  Take over-the-counter and prescription medicines only as told by your doctor. These include: ? Antibiotics. ? Pain medicines. ? Fiber pills. ? Probiotics. ? Stool softeners.  Do not drive or use heavy machinery while taking prescription pain medicine.  If you were prescribed an antibiotic, take it as told. Do not stop taking it even if you feel better. General instructions   Follow a diet as told by your doctor.  When you feel better, your doctor may tell you to change your diet. You may need to eat a lot of fiber. Fiber makes it easier to poop (have bowel movements). Healthy foods with fiber include: ? Berries. ? Beans. ? Lentils. ? Green vegetables.  Exercise 3 or more times a week. Aim for 30 minutes each time. Exercise enough to sweat and make your heart beat faster.  Keep all follow-up visits as told. This is important. You may need to have an exam of the large intestine. This is called a colonoscopy. Contact a doctor if:  Your pain does not get better.  You have a hard time eating or drinking.  You are not pooping like normal. Get help right away if:  Your pain gets worse.  Your problems do not get better.  Your problems get worse very fast.  You have a fever.  You throw up (vomit) more than one time.  You have poop that is: ? Bloody. ? Black. ? Tarry. Summary  Diverticulitis is when small pockets in your large intestine (colon) get infected or swollen.  Take medicines only as told by your doctor.  Follow a diet as told by your doctor. This information is not intended to replace advice given to you by your health care provider. Make sure you  discuss any questions you have with your health care provider. Document Revised: 07/11/2017 Document Reviewed: 08/15/2016 Elsevier Patient Education  [place].       Gluten free trial for 4-6 weeks. OK to use gluten-free bread and gluten-free pasta.    Gluten-Free Diet for Celiac Disease, Adult The gluten-free diet includes all foods that do not contain gluten. Gluten is a protein that is found in wheat, rye, barley, and some other grains. Following the gluten-free diet is the only treatment for people with celiac disease. It helps to prevent damage to the intestines and improves or eliminates the symptoms of celiac disease. Following the gluten-free diet requires some planning. It can be challenging at first, but it gets easier with time and practice. There are more gluten-free options available today than ever before. If you need help finding gluten-free foods or if you have questions, talk with your diet and nutrition specialist (registered dietitian) or your health care provider. What do I need to know about a gluten-free diet?  All fruits, vegetables, and meats are safe to eat and do not contain gluten.  When grocery shopping, start by shopping in the produce, meat, and dairy sections. These sections are more likely to contain gluten-free foods. Then move to the aisles that contain packaged foods if you need to.  Read all food labels. Gluten is often added to foods. Always check the ingredient list and look for warnings, such as "may contain gluten."  Talk with your  dietitian or health care provider before taking a gluten-free multivitamin or mineral supplement.  Be aware of gluten-free foods having contact with foods that contain gluten (cross-contamination). This can happen at home and with any processed foods. ? Talk with your health care provider or dietitian about how to reduce the risk of cross-contamination in your home. ? If you have questions about how a food is  processed, ask the manufacturer. What key words help to identify gluten? Foods that list any of these key words on the label usually contain gluten:  Wheat, flour, enriched flour, bromated flour, white flour, durum flour, graham flour, phosphated flour, self-rising flour, semolina, farina, barley (malt), rye, and oats.  Starch, dextrin, modified food starch, or cereal.  Thickening, fillers, or emulsifiers.  Malt flavoring, malt extract, or malt syrup.  Hydrolyzed vegetable protein.  In the [place] packaged foods that are gluten-free are required to be labeled "GF." These foods should be easy to identify and are safe to eat. In the [place] food companies are also required to list common food allergens, including wheat, on their labels. Recommended foods Grains  Amaranth, bean flours, 100% buckwheat flour, corn, millet, nut flours or nut meals, GF oats, quinoa, rice, sorghum, teff, rice wafers, pure cornmeal tortillas, popcorn, and hot cereals made from cornmeal. Hominy, rice, wild rice. Some Asian rice noodles or bean noodles. Arrowroot starch, corn bran, corn flour, corn germ, cornmeal, corn starch, potato flour, potato starch flour, and rice bran. Plain, brown, and sweet rice flours. Rice polish, soy flour, and tapioca starch. Vegetables  All plain fresh, frozen, and canned vegetables. Fruits  All plain fresh, frozen, canned, and dried fruits, and 100% fruit juices. Meats and other protein foods  All fresh beef, pork, poultry, fish, seafood, and eggs. Fish canned in water, oil, brine, or vegetable broth. Plain nuts and seeds, peanut butter. Some lunch meat and some frankfurters. Dried beans, dried peas, and lentils. Dairy  Fresh plain, dry, evaporated, or condensed milk. Cream, butter, sour cream, whipping cream, and most yogurts. Unprocessed cheese, most processed cheeses, some cottage cheese, some cream cheeses. Beverages  Coffee, tea, most herbal teas. Carbonated beverages and some  root beers. Wine, sake, and distilled spirits, such as gin, vodka, and whiskey. Most hard ciders. Fats and oils  Butter, margarine, vegetable oil, hydrogenated butter, olive oil, shortening, lard, cream, and some mayonnaise. Some commercial salad dressings. Olives. Sweets and desserts  Sugar, honey, some syrups, molasses, jelly, and jam. Plain hard candy, marshmallows, and gumdrops. Pure cocoa powder. Plain chocolate. Custard and some pudding mixes. Gelatin desserts, sorbets, frozen ice pops, and sherbet. Cake, cookies, and other desserts prepared with allowed flours. Some commercial ice creams. Cornstarch, tapioca, and rice puddings. Seasoning and other foods  Some canned or frozen soups. Monosodium glutamate (MSG). Cider, rice, and wine vinegar. Baking soda and baking powder. Cream of tartar. Baking and nutritional yeast. Certain soy sauces made without wheat (ask your dietitian about specific brands that are allowed). Nuts, coconut, and chocolate. Salt, pepper, herbs, spices, flavoring extracts, imitation or artificial flavorings, natural flavorings, and food colorings. Some medicines and supplements. Some lip glosses and other cosmetics. Rice syrups. The items listed may not be a complete list. Talk with your dietitian about what dietary choices are best for you. Foods to avoid Grains  Barley, bran, bulgur, couscous, cracked wheat, [place], farro, graham, malt, matzo, semolina, wheat germ, and all wheat and rye cereals including spelt and kamut. Cereals containing malt as a flavoring, such as rice cereal.  Noodles, spaghetti, macaroni, most packaged rice mixes, and all mixes containing wheat, rye, barley, or triticale. Vegetables  Most creamed vegetables and most vegetables canned in sauces. Some commercially prepared vegetables and salads. Fruits  Thickened or prepared fruits and some pie fillings. Some fruit snacks and fruit roll-ups. Meats and other protein foods  Any meat or meat  alternative containing wheat, rye, barley, or gluten stabilizers. These are often marinated or packaged meats and lunch meats. Bread-containing products, such as Swiss steak, croquettes, meatballs, and meatloaf. Most tuna canned in vegetable broth and [place] with hydrolyzed vegetable protein (HVP) injected as part of the basting. Seitan. Imitation fish. Eggs in sauces made from ingredients to avoid. Dairy  Commercial chocolate milk drinks and malted milk. Some non-dairy creamers. Any cheese product containing ingredients to avoid. Beverages  Certain cereal beverages. Beer, ale, malted milk, and some root beers. Some hard ciders. Some instant flavored coffees. Some herbal teas made with barley or with barley malt added. Fats and oils  Some commercial salad dressings. Sour cream containing modified food starch. Sweets and desserts  Some toffees. Chocolate-coated nuts (may be rolled in wheat flour) and some commercial candies and candy bars. Most cakes, cookies, donuts, pastries, and other baked goods. Some commercial ice cream. Ice cream cones. Commercially prepared mixes for cakes, cookies, and other desserts. Bread pudding and other puddings thickened with flour. Products containing brown rice syrup made with barley malt enzyme. Desserts and sweets made with malt flavoring. Seasoning and other foods  Some curry powders, some dry seasoning mixes, some gravy extracts, some meat sauces, some ketchups, some prepared mustards, and horseradish. Certain soy sauces. Malt vinegar. Bouillon and bouillon cubes that contain HVP. Some chip dips, and some chewing gum. Yeast extract. Brewer's yeast. Caramel color. Some medicines and supplements. Some lip glosses and other cosmetics. The items listed may not be a complete list. Talk with your dietitian about what dietary choices are best for you. Summary  Gluten is a protein that is found in wheat, rye, barley, and some other grains. The gluten-free diet includes  all foods that do not contain gluten.  If you need help finding gluten-free foods or if you have questions, talk with your diet and nutrition specialist (registered dietitian) or your health care provider.  Read all food labels. Gluten is often added to foods. Always check the ingredient list and look for warnings, such as "may contain gluten." This information is not intended to replace advice given to you by your health care provider. Make sure you discuss any questions you have with your health care provider. Document Released: 07/29/2005 Document Revised: 05/13/2016 Document Reviewed: 05/13/2016 Elsevier Interactive Patient Education  2018 [employer].

## 2020-01-25 NOTE — Assessment & Plan Note (Addendum)
PO ABX Hyoscyamine Activated charcoal Try gluten free diet

## 2020-01-27 ENCOUNTER — Ambulatory Visit (INDEPENDENT_AMBULATORY_CARE_PROVIDER_SITE_OTHER): Payer: No Typology Code available for payment source | Admitting: Licensed Clinical Social Worker

## 2020-01-27 ENCOUNTER — Encounter (HOSPITAL_COMMUNITY): Payer: Self-pay | Admitting: Licensed Clinical Social Worker

## 2020-01-27 ENCOUNTER — Other Ambulatory Visit: Payer: Self-pay

## 2020-01-27 DIAGNOSIS — F411 Generalized anxiety disorder: Secondary | ICD-10-CM

## 2020-01-27 DIAGNOSIS — F319 Bipolar disorder, unspecified: Secondary | ICD-10-CM

## 2020-01-27 NOTE — Progress Notes (Addendum)
Virtual Visit Note  I connected with Alex [name] on 01/27/2020 at 10:00pm EDT by video virtual visit. I verified that I am speaking with the correct person using two identifiers.I discussed the limitations of evaluation and management by telemedicine and the availability of in person appointments. The patient expressed understanding and agreed to proceed.  LOCATION: Patient: Home Provider: Home  History of Present Illness:  Pt was referred by Dr. Adele Schilder for OP therapy for bipolar disorder.    Observations/Objective: Pt presents  Depressed and anxious for virtual video therapy session. Pt discussed his psychiatric symptoms and current life events. Patient reports he continues have depressive symptoms due to his  continuing stressor: his son who is a habitual self mutilation and suicide attempts.He is in [facility]'s inpatient facility but is being released today. Patient discussed his emotions have been up and down, fear because of his son's behavior, anxious because his son is being released today, depressed because this is not what he wanted for this stage of his life. Patient googled BPD, dx of son. [name] provided psychoeducation on DBT therapy and BPD, suggested [facility] who specialize in DBT. Clinician utilized [facility] OARS to reflect and summarize thoughts and feelings.     Follow Up Instructions:  I discussed the assessment and treatment plan with the patient. The patient was provided an opportunity to ask questions and all were answered. The patient agreed with the plan and demonstrated an understanding of the instructions.   The patient was advised to call back or seek an in-person evaluation if the symptoms worsen or if the condition fails to improve as anticipated.  I provided 60 minutes of non-face-to-face time during this encounter.   Marlys Stegmaier S, LCAS

## 2020-02-06 NOTE — Progress Notes (Signed)
   Follow-Up Visit   Subjective  KAIMEN PEINE is a 64 y.o. male who presents for the following: Skin Problem (skin check on scalp and face).  New crusts Location: Face2 Duration:  Quality:  Associated Signs/Symptoms: Modifying Factors:  Severity:  Timing: Context:   The following portions of the chart were reviewed this encounter and updated as appropriate: Tobacco  Allergies  Meds  Problems  Med Hx  Surg Hx  Fam Hx      Objective  Well appearing patient in no apparent distress; mood and affect are within normal limits.  A full examination was performed including scalp, head, eyes, ears, nose, lips, neck, chest, axillae, abdomen, back, buttocks, bilateral upper extremities, bilateral lower extremities, hands, feet, fingers, toes, fingernails, and toenails. All findings within normal limits unless otherwise noted below.   Assessment & Plan  AK (actinic keratosis) (10) Left Frontal Scalp; Mid Parietal Scalp (5); Dorsum of Nose; Left Parotid Area; Right Parotid Area; Right Temporal Scalp  Destruction of lesion - Dorsum of Nose, Left Frontal Scalp, Left Parotid Area, Mid Parietal Scalp, Right Parotid Area, Right Temporal Scalp Complexity: simple   Destruction method: cryotherapy   Informed consent: discussed and consent obtained   Timeout:  patient name, date of birth, surgical site, and procedure verified Lesion destroyed using liquid nitrogen: Yes   Region frozen until ice ball extended beyond lesion: Yes   Cryotherapy cycles:  5 Outcome: patient tolerated procedure well with no complications    Neoplasm of uncertain behavior of skin Right Zygomatic Area  Skin / nail biopsy Type of biopsy: tangential   Informed consent: discussed and consent obtained   Timeout: patient name, date of birth, surgical site, and procedure verified   Procedure prep:  Patient was prepped and draped in usual sterile fashion Prep type:  Chlorhexidine Anesthesia: the lesion was  anesthetized in a standard fashion   Anesthetic:  1% lidocaine w/ epinephrine 1-100,000 local infiltration Instrument used: flexible razor blade   Hemostasis achieved with: ferric subsulfate   Outcome: patient tolerated procedure well   Post-procedure details: wound care instructions given    Specimen 1 - Surgical pathology Differential Diagnosis: scc vs bcc Check Margins: No  Personal history of skin cancer Left Forehead  Annual general skin examination  Skin cancer screening performed today.

## 2020-02-07 ENCOUNTER — Encounter (HOSPITAL_COMMUNITY): Payer: Self-pay | Admitting: Licensed Clinical Social Worker

## 2020-02-07 ENCOUNTER — Other Ambulatory Visit: Payer: Self-pay

## 2020-02-07 ENCOUNTER — Ambulatory Visit (INDEPENDENT_AMBULATORY_CARE_PROVIDER_SITE_OTHER): Payer: No Typology Code available for payment source | Admitting: Licensed Clinical Social Worker

## 2020-02-07 DIAGNOSIS — F319 Bipolar disorder, unspecified: Secondary | ICD-10-CM

## 2020-02-07 DIAGNOSIS — F411 Generalized anxiety disorder: Secondary | ICD-10-CM
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
## 2020-02-10 ENCOUNTER — Telehealth: Payer: Self-pay

## 2020-02-10 ENCOUNTER — Other Ambulatory Visit: Payer: Self-pay | Admitting: Internal Medicine

## 2020-02-10 DIAGNOSIS — E291 Testicular hypofunction: Secondary | ICD-10-CM

## 2020-02-10 DIAGNOSIS — Z789 Other specified health status: Secondary | ICD-10-CM

## 2020-02-10 NOTE — Telephone Encounter (Signed)
New message    The patient is asking can Dr. Jacalyn Lefevre recommend where to go to get a blood test for covid.

## 2020-02-11 NOTE — Telephone Encounter (Signed)
Pt antibodies test scheduled for 02/15/20 at 2:45. Can he as have his testosterone and thyroid levels checked as well?

## 2020-02-11 NOTE — Telephone Encounter (Signed)
If it is COVID 19 antibody test, we can do it at our lab Thx

## 2020-02-13 NOTE — Telephone Encounter (Signed)
Ok Thx 

## 2020-02-15 ENCOUNTER — Other Ambulatory Visit: Payer: No Typology Code available for payment source

## 2020-02-15 ENCOUNTER — Other Ambulatory Visit: Payer: Self-pay

## 2020-02-15 DIAGNOSIS — E890 Postprocedural hypothyroidism: Secondary | ICD-10-CM

## 2020-02-15 DIAGNOSIS — E291 Testicular hypofunction: Secondary | ICD-10-CM

## 2020-02-15 DIAGNOSIS — Z789 Other specified health status: Secondary | ICD-10-CM

## 2020-02-15 LAB — THYROID PANEL WITH TSH
Free Thyroxine Index: 2.1 (ref 1.4–3.8)
T3 Uptake: 28 % (ref 22–35)
T4, Total: 7.6 ug/dL (ref 4.9–10.5)
TSH: 5.26 mIU/L — ABNORMAL HIGH (ref 0.40–4.50)

## 2020-02-15 LAB — SARS-COV-2 IGG: SARS-COV-2 IgG: 1.13

## 2020-02-15 LAB — TESTOSTERONE: Testosterone: 299.6 ng/dL — ABNORMAL LOW (ref 300.00–890.00)

## 2020-02-15 NOTE — Telephone Encounter (Signed)
Labs ordered and pt informed

## 2020-02-21 ENCOUNTER — Encounter (HOSPITAL_COMMUNITY): Payer: Self-pay | Admitting: Licensed Clinical Social Worker

## 2020-02-21 ENCOUNTER — Ambulatory Visit (INDEPENDENT_AMBULATORY_CARE_PROVIDER_SITE_OTHER): Payer: No Typology Code available for payment source | Admitting: Licensed Clinical Social Worker

## 2020-02-21 ENCOUNTER — Other Ambulatory Visit: Payer: Self-pay

## 2020-02-21 DIAGNOSIS — F411 Generalized anxiety disorder: Secondary | ICD-10-CM

## 2020-02-21 DIAGNOSIS — F319 Bipolar disorder, unspecified: Secondary | ICD-10-CM
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
## 2020-02-22 ENCOUNTER — Encounter: Payer: Self-pay | Admitting: Internal Medicine

## 2020-02-22 ENCOUNTER — Ambulatory Visit: Payer: No Typology Code available for payment source | Admitting: Internal Medicine

## 2020-02-22 ENCOUNTER — Other Ambulatory Visit: Payer: Self-pay

## 2020-02-22 ENCOUNTER — Other Ambulatory Visit (INDEPENDENT_AMBULATORY_CARE_PROVIDER_SITE_OTHER): Payer: No Typology Code available for payment source

## 2020-02-22 VITALS — BP 122/94 | HR 87 | Temp 98.2°F | Ht 72.0 in | Wt 182.0 lb

## 2020-02-22 DIAGNOSIS — R600 Localized edema: Secondary | ICD-10-CM

## 2020-02-22 DIAGNOSIS — E291 Testicular hypofunction: Secondary | ICD-10-CM | POA: Diagnosis not present

## 2020-02-22 DIAGNOSIS — F439 Reaction to severe stress, unspecified: Secondary | ICD-10-CM

## 2020-02-22 DIAGNOSIS — M2550 Pain in unspecified joint: Secondary | ICD-10-CM | POA: Diagnosis not present

## 2020-02-22 DIAGNOSIS — F329 Major depressive disorder, single episode, unspecified: Secondary | ICD-10-CM

## 2020-02-22 DIAGNOSIS — E785 Hyperlipidemia, unspecified: Secondary | ICD-10-CM

## 2020-02-22 DIAGNOSIS — M353 Polymyalgia rheumatica: Secondary | ICD-10-CM

## 2020-02-22 LAB — LIPID PANEL
Cholesterol: 144 mg/dL (ref 0–200)
HDL: 53.9 mg/dL (ref >39.00)
LDL Cholesterol: 60 mg/dL (ref 0–99)
NonHDL: 89.84
Total CHOL/HDL Ratio: 3
Triglycerides: 149 mg/dL (ref 0.0–149.0)
VLDL: 29.8 mg/dL (ref 0.0–40.0)

## 2020-02-22 LAB — CBC WITH DIFFERENTIAL/PLATELET
Basophils Absolute: 0 10*3/uL (ref 0.0–0.1)
Basophils Relative: 0.3 % (ref 0.0–3.0)
Eosinophils Absolute: 0.5 10*3/uL (ref 0.0–0.7)
Eosinophils Relative: 5.2 % — ABNORMAL HIGH (ref 0.0–5.0)
HCT: 48.7 % (ref 39.0–52.0)
Hemoglobin: 16.2 g/dL (ref 13.0–17.0)
Lymphocytes Relative: 16.2 % (ref 12.0–46.0)
Lymphs Abs: 1.7 10*3/uL (ref 0.7–4.0)
MCHC: 33.3 g/dL (ref 30.0–36.0)
MCV: 80.2 fl (ref 78.0–100.0)
Monocytes Absolute: 1.3 10*3/uL — ABNORMAL HIGH (ref 0.1–1.0)
Monocytes Relative: 12.6 % — ABNORMAL HIGH (ref 3.0–12.0)
Neutro Abs: 6.8 10*3/uL (ref 1.4–7.7)
Neutrophils Relative %: 65.7 % (ref 43.0–77.0)
Platelets: 235 10*3/uL (ref 150.0–400.0)
RBC: 6.07 Mil/uL — ABNORMAL HIGH (ref 4.22–5.81)
RDW: 17.7 % — ABNORMAL HIGH (ref 11.5–15.5)
WBC: 10.3 10*3/uL (ref 4.0–10.5)

## 2020-02-22 LAB — BASIC METABOLIC PANEL
BUN: 17 mg/dL (ref 6–23)
CO2: 26 mEq/L (ref 19–32)
Calcium: 8.9 mg/dL (ref 8.4–10.5)
Chloride: 104 mEq/L (ref 96–112)
Creatinine, Ser: 1.12 mg/dL (ref 0.40–1.50)
GFR: 65.55 mL/min (ref >60.00)
Glucose, Bld: 134 mg/dL — ABNORMAL HIGH (ref 70–99)
Potassium: 4.1 mEq/L (ref 3.5–5.1)
Sodium: 139 mEq/L (ref 135–145)

## 2020-02-22 LAB — HEPATIC FUNCTION PANEL
ALT: 34 U/L (ref 0–53)
AST: 29 U/L (ref 0–37)
Albumin: 4.2 g/dL (ref 3.5–5.2)
Alkaline Phosphatase: 72 U/L (ref 39–117)
Bilirubin, Direct: 0.2 mg/dL (ref 0.0–0.3)
Total Bilirubin: 0.8 mg/dL (ref 0.2–1.2)
Total Protein: 6.4 g/dL (ref 6.0–8.3)

## 2020-02-22 LAB — T4, FREE: Free T4: 0.97 ng/dL (ref 0.60–1.60)

## 2020-02-22 LAB — TESTOSTERONE: Testosterone: 1041.78 ng/dL — ABNORMAL HIGH (ref 300.00–890.00)

## 2020-02-22 LAB — PSA: PSA: 2.59 ng/mL (ref 0.10–4.00)

## 2020-02-22 LAB — SEDIMENTATION RATE: Sed Rate: 4 mm/hr (ref 0–20)

## 2020-02-22 LAB — CK: Total CK: 354 U/L — ABNORMAL HIGH (ref 7–232)

## 2020-02-22 MED ORDER — PREDNISONE 5 MG PO TABS: ORAL_TABLET | ORAL | 3 refills | Status: DC

## 2020-02-22 NOTE — Assessment & Plan Note (Signed)
Chronic home stress with [name] and his ongoing self harming behavior.  Wille Glaser is very worn out.

## 2020-02-22 NOTE — Patient Instructions (Addendum)
Hold Lipitor   Polymyalgia Rheumatica Polymyalgia rheumatica (PMR) is an inflammatory disorder that causes the muscles and joints to ache and become stiff. Sometimes, PMR leads to a more dangerous condition that can cause vision loss (temporal arteritis or giant cell arteritis). What are the causes? The exact cause of PMR is not known. What increases the risk? You are more likely to develop this condition if you are:  Male.  66 years of age or older.  Caucasian. What are the signs or symptoms? Pain and stiffness are the main symptoms of PMR. Symptoms may:  Be worse after inactivity and in the morning.  Affect your: ? Hips, buttocks, and thighs. ? Neck, arms, and shoulders. This can make it hard to raise your arms above your head. ? Hands and wrists. Other symptoms include:  Fever.  Tiredness.  Weakness.  Depression.  Decreased appetite. This may lead to weight loss. Symptoms may start slowly or suddenly. How is this diagnosed? This condition is diagnosed with your medical history and a physical exam. You may need to see a health care provider who specializes in diseases of the joints, muscles, and bones (rheumatologist). You may also have tests, including:  Blood tests.  X-rays.  Ultrasound. How is this treated? PMR usually goes away without treatment, but it may take years. Your health care provider may recommend low-dose steroids and other medicines to help manage your symptoms of pain and stiffness. Regular exercise and rest will also help your symptoms. Follow these instructions at home:   Take over-the-counter and prescription medicines only as told by your health care provider.  Make sure to get enough rest and sleep.  Eat a healthy and nutritious diet.  Try to exercise most days of the week. Ask your health care provider what type of exercise is best for you.  Keep all follow-up visits as told by your health care provider. This is important. Contact  a health care provider if:  Your symptoms do not improve with medicine.  You have side effects from steroids. These may include: ? Weight gain. ? Swelling. ? Insomnia. ? Mood changes. ? Bruising. ? High blood sugar readings, if you have diabetes. ? Higher than normal blood pressure readings, if you monitor your blood pressure. Get help right away if:  You develop symptoms of temporal arteritis, such as: ? A change in vision. ? Severe headache. ? Scalp pain. ? Jaw pain. Summary  Polymyalgia rheumatica is an inflammatory disorder that causes aching and stiffness in your muscles and joints.  The exact cause of this condition is not known.  This condition usually goes away without treatment. Your health care provider may give you low-dose steroids to help manage your pain and stiffness.  Rest and regular exercise will help the symptoms. This information is not intended to replace advice given to you by your health care provider. Make sure you discuss any questions you have with your health care provider. Document Revised: 06/04/2018 Document Reviewed: 06/04/2018 Elsevier Patient Education  2020 [employer].

## 2020-02-22 NOTE — Assessment & Plan Note (Addendum)
  Dr Amil Amen ref Prednisone 15 mg/d Hold Lipitor Labs Stress discussed

## 2020-02-22 NOTE — Assessment & Plan Note (Signed)
Discussed.

## 2020-02-22 NOTE — Addendum Note (Signed)
Addended by: Trenda Moots on: 09/28/4713 02:34 PM   Modules accepted: Orders

## 2020-02-22 NOTE — Progress Notes (Signed)
Subjective:  Patient ID: Alex [name], male    DOB: Dec 09, 1955  Age: 64 y.o. MRN: [id number]  CC: No chief complaint on file.   HPI Alex [name] presents for severe arthralgia 8/10, and stiffness x 1-2 wks. Worse in am - 3 hrs to get better and less stiff. Ibuprofen helps a little. Hands - very painful  Outpatient Medications Prior to Visit  Medication Sig Dispense Refill  . acetaminophen (TYLENOL) 500 MG tablet Take 1,000 mg by mouth 2 (two) times daily as needed for headache.    . albuterol (PROAIR HFA) 108 (90 Base) MCG/ACT inhaler Inhale 2 puffs into the lungs every 6 (six) hours as needed for wheezing or shortness of breath. 18 g 5  . aspirin EC 81 MG tablet Take 1 tablet by mouth daily.    [name] atorvastatin (LIPITOR) 40 MG tablet TAKE 1 TABLET BY MOUTH EVERYDAY AT BEDTIME 90 tablet 3  . AXIRON 30 MG/ACT SOLN Place 2 Act onto the skin daily.    . clotrimazole (MYCELEX) 10 MG troche Take 1 tablet (10 mg total) by mouth 5 (five) times daily. 35 tablet 0  . Coenzyme Q10 (CO Q 10 PO) Take 1 capsule by mouth daily.     . Immune Globulin, Human, (HIZENTRA) 4 GM/20ML SOLN Inject into the skin once a week. wednesday    . LamoTRIgine 300 MG TB24 24 hour tablet Take 1 tablet (300 mg total) by mouth every morning. 90 tablet 0  . latanoprost (XALATAN) 0.005 % ophthalmic solution 1 DROP EACH EYE AT NIGHT    . levothyroxine (SYNTHROID) 150 MCG tablet TAKE 1 TABLET BY MOUTH EVERY DAY BEFORE BREAKFAST 90 tablet 3  . LORazepam (ATIVAN) 0.5 MG tablet Take 1 tab daily as needed and 1 tab at bed time 45 tablet 2  . LUTEIN PO Take 1 tablet by mouth daily.     [name] LYCOPENE PO Take by mouth.    . meloxicam (MOBIC) 15 MG tablet Take 1 tablet (15 mg total) by mouth daily. (Patient taking differently: Take 15 mg by mouth daily as needed. ) 30 tablet 5  . Multiple Vitamin (MULTI-VITAMINS) TABS Take by mouth.    . OMEGA-3 KRILL OIL PO Take 1 capsule by mouth daily.     [name] omeprazole (PRILOSEC) 20 MG  capsule Take 20 mg by mouth every evening.    . sildenafil (REVATIO) 20 MG tablet TAKE 1 po qd 30 tablet 11  . sucralfate (CARAFATE) 1 G tablet Take 1 g by mouth daily as needed (ACID REFLUX).   3  . tadalafil (CIALIS) 20 MG tablet TAKE 1 TABLET BY MOUTH ONCE DAILY AS NEEDED FOR ERECTILE DYSFUNCTION 30 tablet 2  . tamsulosin (FLOMAX) 0.4 MG CAPS capsule TAKE 1 CAPSULE BY MOUTH EVERY DAY (Patient taking differently: daily as needed. ) 90 capsule 1  . telmisartan (MICARDIS) 80 MG tablet TAKE 1 TABLET BY MOUTH EVERY DAY 90 tablet 2  . timolol (TIMOPTIC) 0.5 % ophthalmic solution timolol maleate 0.5 % eye drops  INSTILL 1 DROP INTO RIGHT EYE TWICE A DAY    . traMADol (ULTRAM) 50 MG tablet Take 50-100 mg by mouth every 6 (six) hours as needed.    . zolpidem (AMBIEN) 10 MG tablet Take 1/2 tab at bed time and take other half if needed 30 tablet 1  . Charcoal Activated 260 MG CAPS Take 1-2 capsules (260-520 mg total) by mouth 4 (four) times daily as needed. For gas. Do not take  together with your other meds (Patient not taking: Reported on 02/22/2020) 100 capsule 3  . hyoscyamine (LEVSIN) 0.125 MG tablet Take 1 tablet (0.125 mg total) by mouth every 4 (four) hours as needed for up to 10 days. 100 tablet 3  . amoxicillin-clavulanate (AUGMENTIN) 875-125 MG tablet Take 1 tablet by mouth 2 (two) times daily. 20 tablet 0   No facility-administered medications prior to visit.    ROS: Review of Systems  Constitutional: Positive for fatigue. Negative for appetite change and unexpected weight change.  HENT: Negative for congestion, nosebleeds, sneezing, sore throat and trouble swallowing.   Eyes: Negative for itching and visual disturbance.  Respiratory: Negative for cough.   Cardiovascular: Negative for chest pain, palpitations and leg swelling.  Gastrointestinal: Negative for abdominal distention, blood in stool, diarrhea and nausea.  Genitourinary: Negative for frequency and hematuria.  Musculoskeletal:  Positive for arthralgias, back pain, gait problem, myalgias, neck pain and neck stiffness. Negative for joint swelling.  Skin: Negative for rash.  Neurological: Negative for dizziness, tremors, speech difficulty and weakness.  Psychiatric/Behavioral: Negative for agitation, dysphoric mood and sleep disturbance. The patient is nervous/anxious.     Objective:  BP (!) 122/94 (BP Location: Right Arm, Patient Position: Sitting, Cuff Size: Normal)   Pulse 87   Temp 98.2 F (36.8 C) (Oral)   Ht 6' (1.829 m)   Wt 182 lb (82.6 kg)   SpO2 97%   BMI 24.68 kg/m   BP Readings from Last 3 Encounters:  02/22/20 (!) 122/94  01/25/20 (!) 144/98  12/08/19 (!) 146/97    Wt Readings from Last 3 Encounters:  02/22/20 182 lb (82.6 kg)  01/25/20 182 lb (82.6 kg)  12/08/19 184 lb (83.5 kg)    Physical Exam Constitutional:      General: He is not in acute distress.    Appearance: He is well-developed.     Comments: NAD  Eyes:     Conjunctiva/sclera: Conjunctivae normal.     Pupils: Pupils are equal, round, and reactive to light.  Neck:     Thyroid: No thyromegaly.     Vascular: No JVD.  Cardiovascular:     Rate and Rhythm: Normal rate and regular rhythm.     Heart sounds: Normal heart sounds. No murmur heard.  No friction rub. No gallop.   Pulmonary:     Effort: Pulmonary effort is normal. No respiratory distress.     Breath sounds: Normal breath sounds. No wheezing or rales.  Chest:     Chest wall: No tenderness.  Abdominal:     General: Bowel sounds are normal. There is no distension.     Palpations: Abdomen is soft. There is no mass.     Tenderness: There is no abdominal tenderness. There is no guarding or rebound.  Musculoskeletal:        General: Tenderness present. Normal range of motion.     Cervical back: Normal range of motion.  Lymphadenopathy:     Cervical: No cervical adenopathy.  Skin:    General: Skin is warm and dry.     Findings: No rash.  Neurological:     Mental  Status: He is alert and oriented to person, place, and time.     Cranial Nerves: No cranial nerve deficit.     Motor: No abnormal muscle tone.     Coordination: Coordination normal.     Gait: Gait normal.     Deep Tendon Reflexes: Reflexes are normal and symmetric.  Psychiatric:  Behavior: Behavior normal.        Thought Content: Thought content normal.        Judgment: Judgment normal.     Lab Results  Component Value Date   WBC 10.5 09/13/2019   HGB 15.1 09/13/2019   HCT 46.5 09/13/2019   PLT 347.0 09/13/2019   GLUCOSE 135 (H) 09/13/2019   CHOL 163 05/31/2019   TRIG 83 05/31/2019   HDL 75 05/31/2019   LDLDIRECT 59.0 02/02/2016   LDLCALC 73 05/31/2019   ALT 33 09/13/2019   AST 41 (H) 09/13/2019   NA 136 09/13/2019   K 3.9 09/13/2019   CL 100 09/13/2019   CREATININE 1.02 09/13/2019   BUN 18 09/13/2019   CO2 28 09/13/2019   TSH 5.26 (H) 02/15/2020   PSA 3.04 01/26/2019   INR 1.0 09/13/2019   HGBA1C 5.5 09/29/2018    CT ABDOMEN PELVIS W CONTRAST  Addendum Date: 09/02/2019   ADDENDUM REPORT: 09/02/2019 11:02 ADDENDUM: The original report was by Dr. Van Clines. The following addendum is by Dr. Van Clines: These results were called by telephone at the time of interpretation on 09/02/2019 at 11:00 am to provider [facility] , who verbally acknowledged these results. By report, the patient has also been followed at [facility] for a pancreatic cystic lesion, the only candidate structure I see on today's CT is a 4 mm faint lobulation along the anterior margin of the pancreatic body on image 26/2 which may could represent a tiny cyst. Electronically Signed   By: Van Clines [name]   On: 09/02/2019 11:02   Result Date: 09/02/2019 CLINICAL DATA:  Left flank and left lower quadrant abdominal pain starting earlier this week. EXAM: CT ABDOMEN AND PELVIS WITH CONTRAST TECHNIQUE: Multidetector CT imaging of the abdomen and pelvis was performed using the standard protocol  following bolus administration of intravenous contrast. CONTRAST:  168mL ISOVUE-300 IOPAMIDOL (ISOVUE-300) INJECTION 61% COMPARISON:  Chest CT 06/13/2017; abdominal ultrasound 04/07/2015; MRI abdomen 06/22/2017; abdominal ultrasound 12/22/2017 FINDINGS: Lower chest: Descending thoracic aortic atherosclerotic calcification. Hepatobiliary: Several hepatic cysts are similar to the prior examination. There is a portacaval vascular malformation posteriorly in the right hepatic lobe with an associated varix measuring 2.7 by 2.0 cm on image 24/2, previously 1.5 by 1.2 cm on 06/22/2017, and measured at up to 1.9 cm in long axis on the interval Doppler ultrasound of 12/22/2017. No findings of cirrhosis. Gallbladder unremarkable. Pancreas: Unremarkable Spleen: Unremarkable Adrenals/Urinary Tract: Two right renal calculi are identified, the larger in the mid kidney measuring 0.6 cm in long axis on image 47/3. Five left renal calculi are identified, the largest in the lower pole measuring 0.4 cm on image 60/3. No ureteral or bladder calculus identified. Bladder wall thickening is probably primarily from nondistention and appears uniform. Adrenal glands normal. Stomach/Bowel: Wall thickening in the lower rectum for example on image 81/2, nonspecific and possibly due to nondistention. There is sigmoid colon diverticulosis. Subtle stranding in the sigmoid colon mesentery is probably attributable to the inflammation along the adjacent retroperitoneum but could conceivably reflect low-grade diverticulitis. No diverticular abscess or extraluminal gas. Vascular/Lymphatic: Aortoiliac atherosclerotic vascular disease. Reproductive: Prostatomegaly. Dystrophic central prostate calcification. Other: Acute hemorrhage of the left iliacus muscle and also tracking in the posterior perirenal space, with stranding tracking along the retro renal fascia on the left, and along the left psoas muscle. There is edema tracking in the lower  retroperitoneum and over to the right side in the upper pelvis, as well as mild presacral edema.  There is edema along the left transversalis fascia adjacent to the iliacus muscle, and low-grade edema extending around anteriorly to the space of Retzius, and along the margins of the perirectal space. The posterior perirenal space hematoma measures about 8.6 by 4.0 by 7.3 cm (volume = 130 cm^3), but this does not include the hematoma component in the left iliacus muscle. I do not see any compelling findings of active extravasation. Stranding around the distal iliopsoas muscle and tendon extend down to the lesser trochanter insertion site. Musculoskeletal: Old healed fractures of both tenth ribs posterolaterally. Prior posterolateral rod and pedicle screw fixation at L3-4. Mild levoconvex lumbar scoliosis. IMPRESSION: 1. Acute hemorrhage of the left iliacus muscle and tracking in the left posterior perirenal space, with associated edema and stranding tracking in the retroperitoneum, left greater than right pelvic sidewalls, and presacral region. The component of hematoma in the posterior perirenal space on the left is about 130 cubic cm, but this does not include the component of hematoma within the iliacus muscle. No active extravasation identified. 2. Bilateral nonobstructive nephrolithiasis. 3. Sigmoid colon diverticulosis. Subtle stranding in the sigmoid colon mesentery is probably attributable to the inflammation along the adjacent retroperitoneum but could conceivably reflect low-grade diverticulitis. 4. 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 Current measurement 2.7 by 2.0 cm, previously 1.5 by 1.2 cm on 06/22/2017. Such lesions, especially when larger, can lead to complications such as hepatic encephalopathy. If the patient develops associated symptoms, then surgical or percutaneous treatment would be recommended. In the absence of symptoms,  surveillance by ultrasound every 6 months is recommended to track in the further growth. 5. Wall thickening in the lower rectum is technically nonspecific, but probably from nondistention. 6. Prostatomegaly. 7. Aortic Atherosclerosis (ICD10-I70.0). Radiology assistant personnel have been notified to put me in telephone contact with the referring physician or the referring physician's clinical representative in order to discuss these findings. Once this communication is established I will issue an addendum to this report for documentation purposes. Electronically Signed: By: Van Clines [name] On: 09/02/2019 10:32    Assessment & Plan:     Follow-up: No follow-ups on file.  Walker Kehr, MD

## 2020-02-23 ENCOUNTER — Other Ambulatory Visit: Payer: Self-pay | Admitting: Rehabilitation

## 2020-02-23 DIAGNOSIS — M4326 Fusion of spine, lumbar region: Secondary | ICD-10-CM

## 2020-02-23 LAB — RHEUMATOID FACTOR: Rheumatoid fact SerPl-aCnc: <14 IU/mL (ref <14)

## 2020-02-29 ENCOUNTER — Ambulatory Visit (HOSPITAL_COMMUNITY): Payer: 59 | Admitting: Psychiatry

## 2020-03-06 ENCOUNTER — Encounter (HOSPITAL_COMMUNITY): Payer: Self-pay | Admitting: Licensed Clinical Social Worker

## 2020-03-06 ENCOUNTER — Ambulatory Visit (INDEPENDENT_AMBULATORY_CARE_PROVIDER_SITE_OTHER): Payer: No Typology Code available for payment source | Admitting: Licensed Clinical Social Worker

## 2020-03-06 ENCOUNTER — Other Ambulatory Visit: Payer: Self-pay

## 2020-03-06 DIAGNOSIS — F411 Generalized anxiety disorder: Secondary | ICD-10-CM

## 2020-03-06 DIAGNOSIS — F319 Bipolar disorder, unspecified: Secondary | ICD-10-CM | POA: Diagnosis not present
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
## 2020-03-09 ENCOUNTER — Other Ambulatory Visit: Payer: Self-pay

## 2020-03-09 ENCOUNTER — Ambulatory Visit (INDEPENDENT_AMBULATORY_CARE_PROVIDER_SITE_OTHER): Payer: No Typology Code available for payment source | Admitting: Psychiatry

## 2020-03-09 ENCOUNTER — Encounter (HOSPITAL_COMMUNITY): Payer: Self-pay | Admitting: Psychiatry

## 2020-03-09 DIAGNOSIS — F319 Bipolar disorder, unspecified: Secondary | ICD-10-CM

## 2020-03-09 DIAGNOSIS — F411 Generalized anxiety disorder: Secondary | ICD-10-CM | POA: Diagnosis not present

## 2020-03-09 MED ORDER — LORAZEPAM 0.5 MG PO TABS: 0.5000 mg | ORAL_TABLET | Freq: Two times a day (BID) | ORAL | 2 refills | Status: DC

## 2020-03-09 MED ORDER — LAMOTRIGINE ER 300 MG PO TB24: 1.0000 | ORAL_TABLET | ORAL | 0 refills | Status: DC

## 2020-03-09 MED ORDER — ZOLPIDEM TARTRATE 10 MG PO TABS: ORAL_TABLET | ORAL | 1 refills | Status: DC

## 2020-03-09 NOTE — Progress Notes (Signed)
Virtual Visit via Telephone Note  I connected with Alex [name] on 03/09/20 at  1:40 PM EDT by telephone and verified that I am speaking with the correct person using two identifiers.  Location: Patient: with family in [place] Provider: Home office   I discussed the limitations, risks, security and privacy concerns of performing an evaluation and management service by telephone and the availability of in person appointments. I also discussed with the patient that there may be a patient responsible charge related to this service. The patient expressed understanding and agreed to proceed.   History of Present Illness: Patient is evaluated by phone session.  He is under a lot of stress because having joint pains and now his PCP recommend to see rheumatologist.  He is also concerned about his son Alex [name] who continues to have ups and downs in his mood.  He had colonoscopy and found that he has razor in his anus.  He is very concerned because currently his son is working with good job but afraid that he may lose the job if he is not medically stable.  Patient is upcoming vacation with the family to [name] and is looking forward to that vacation.  He feels the lorazepam does not help as much and sometimes he needs the second 1 during the day.  He is in therapy with back and that is helping him.  He denies any crying spells or any feeling of hopelessness.  Denies any suicidal thoughts.  His appetite is okay.  His energy level is fair.  He recently had blood work.  He has no rash or any itching.  He is compliant with Lamictal, Ambien.  Recently he was given steroids to help with joint pain and he noticed some time insomnia.     Past Psychiatric History:Reviewed. [facility]. No h/opsychosis buth/o poor impulse control. In the past tookSeroquel,Cymbaltaand Remeron (increase  depression)  Recent Results (from the past 2160 hour(s))  SARS-COV-2 IgG     Status: None   Collection Time: 02/15/20  2:37 PM  Result Value Ref Range   SARS-COV-2 IgG 1.13 Reactive Non-Reactive    Comment: This test is intended for use as an aid in identifying individuals with an adaptive immune response to SARS-CoV-2, indicating recent or prior infection. Results are for the detection of SARS-CoV-2 antibodies. IgG antibodies to SARS-CoV-2 are generally  detectable in blood several days after initial infection, although the duration of time antibodies are present post-infection is not well characterized. At this time, it is unknown for how long antibodies persist following infection and if the presence  of antibodies confers protective immunity. Individuals may have detectable virus by molecular testing present for several weeks following seroconversion. Negative results do not preclude acute SARS-CoV-2 infection. This test should not be used to  diagnose acute SARS-CoV-2 infection. If acute infection is suspected, direct testing by molecular methods for SARS-CoV-2 is necessary. False positive results for the test may occur due to cross-reactivity from pre-existing antibodies or other  possible  causes.  Results for this test are reported as "Reactive" if antibodies are detected, "Non-Reactive" if not detected or "Equivocal" if neither "Reactive nor Non-Reactive".  For samples in the Equivocal zone it is recommended that a new sample be  collected and retested 1-2 weeks later.  A conversion from Equivocal to Reactive for IgG antibody should be considered as evidence of seroconversion due to recent infection.This test has been authorized by the  FDA under an Emergency Use Authorization  (EUA) for use by authorized laboratories.   Testosterone     Status: Abnormal   Collection Time: 02/15/20  2:37 PM  Result Value Ref Range   Testosterone 299.60 (L) 300.00 - 890.00 ng/dL  Thyroid Panel With TSH      Status: Abnormal   Collection Time: 02/15/20  2:37 PM  Result Value Ref Range   T3 Uptake 28 22 - 35 %   T4, Total 7.6 4.9 - 10.5 mcg/dL   Free Thyroxine Index 2.1 1.4 - 3.8   TSH 5.26 (H) 0.40 - 4.50 mIU/L  Basic metabolic panel     Status: Abnormal   Collection Time: 02/22/20  2:34 PM  Result Value Ref Range   Sodium 139 135 - 145 mEq/L   Potassium 4.1 3.5 - 5.1 mEq/L   Chloride 104 96 - 112 mEq/L   CO2 26 19 - 32 mEq/L   Glucose, Bld 134 (H) 70 - 99 mg/dL   BUN 17 6 - 23 mg/dL   Creatinine, Ser 1.12 0.40 - 1.50 mg/dL   GFR 65.55 >60.00 mL/min   Calcium 8.9 8.4 - 10.5 mg/dL  CK     Status: Abnormal   Collection Time: 02/22/20  2:34 PM  Result Value Ref Range   Total CK 354 (H) 7.0 - 232.0 U/L  Rheumatoid factor     Status: None   Collection Time: 02/22/20  2:34 PM  Result Value Ref Range   Rhuematoid fact SerPl-aCnc <14 <14 IU/mL  Sedimentation rate     Status: None   Collection Time: 02/22/20  2:34 PM  Result Value Ref Range   Sed Rate 4 0 - 20 mm/hr  Lipid panel     Status: None   Collection Time: 02/22/20  2:36 PM  Result Value Ref Range   Cholesterol 144 0 - 200 mg/dL    Comment: ATP III Classification       Desirable:  < 200 mg/dL               Borderline High:  200 - 239 mg/dL          High:  > = 240 mg/dL   Triglycerides 149.0 0 - 149 mg/dL    Comment: Normal:  <150 mg/dLBorderline High:  150 - 199 mg/dL   HDL 53.90 >39.00 mg/dL   VLDL 29.8 0.0 - 40.0 mg/dL   LDL Cholesterol 60 0 - 99 mg/dL   Total CHOL/HDL Ratio 3     Comment:                Men          Women1/2 Average Risk     3.4          3.3Average Risk          5.0          4.42X Average Risk          9.6          7.13X Average Risk          15.0          11.0                       NonHDL 89.84     Comment: NOTE:  Non-HDL goal should be 30 mg/dL higher than patient's LDL goal (i.e. LDL goal of < 70 mg/dL, would have non-HDL goal of < 100 mg/dL)  Hepatic  function panel     Status: None   Collection Time:  02/22/20  2:36 PM  Result Value Ref Range   Total Bilirubin 0.8 0.2 - 1.2 mg/dL   Bilirubin, Direct 0.2 0.0 - 0.3 mg/dL   Alkaline Phosphatase 72 39 - 117 U/L   AST 29 0 - 37 U/L   ALT 34 0 - 53 U/L   Total Protein 6.4 6.0 - 8.3 g/dL   Albumin 4.2 3.5 - 5.2 g/dL  PSA     Status: None   Collection Time: 02/22/20  2:36 PM  Result Value Ref Range   PSA 2.59 0.10 - 4.00 ng/mL    Comment: Test performed using Access Hybritech PSA Assay, a parmagnetic partical, chemiluminecent immunoassay.  Testosterone     Status: Abnormal   Collection Time: 02/22/20  2:36 PM  Result Value Ref Range   Testosterone 1,041.78 (H) 300.00 - 890.00 ng/dL  T4, free     Status: None   Collection Time: 02/22/20  2:36 PM  Result Value Ref Range   Free T4 0.97 0.60 - 1.60 ng/dL    Comment: Specimens from patients who are undergoing biotin therapy and /or ingesting biotin supplements may contain high levels of biotin.  The higher biotin concentration in these specimens interferes with this Free T4 assay.  Specimens that contain high levels  of biotin may cause false high results for this Free T4 assay.  Please interpret results in light of the total clinical presentation of the patient.    CBC with Differential/Platelet     Status: Abnormal   Collection Time: 02/22/20  2:36 PM  Result Value Ref Range   WBC 10.3 4.0 - 10.5 K/uL   RBC 6.07 (H) 4.22 - 5.81 Mil/uL   Hemoglobin 16.2 13.0 - 17.0 g/dL   HCT 48.7 39 - 52 %   MCV 80.2 78.0 - 100.0 fl   MCHC 33.3 30.0 - 36.0 g/dL   RDW 17.7 (H) 11.5 - 15.5 %   Platelets 235.0 150 - 400 K/uL   Neutrophils Relative % 65.7 43 - 77 %   Lymphocytes Relative 16.2 12 - 46 %   Monocytes Relative 12.6 (H) 3 - 12 %   Eosinophils Relative 5.2 (H) 0 - 5 %   Basophils Relative 0.3 0 - 3 %   Neutro Abs 6.8 1.4 - 7.7 K/uL   Lymphs Abs 1.7 0.7 - 4.0 K/uL   Monocytes Absolute 1.3 (H) 0 - 1 K/uL   Eosinophils Absolute 0.5 0 - 0 K/uL   Basophils Absolute 0.0 0 - 0 K/uL       Psychiatric Specialty Exam: Physical Exam  Review of Systems  Weight 182 lb (82.6 kg).There is no height or weight on file to calculate BMI.  General Appearance: NA  Eye Contact:  NA  Speech:  Slow  Volume:  Normal  Mood:  Anxious  Affect:  NA  Thought Process:  Goal Directed  Orientation:  Full (Time, Place, and Person)  Thought Content:  Rumination  Suicidal Thoughts:  No  Homicidal Thoughts:  No  Memory:  Immediate;   Good Recent;   Good Remote;   Good  Judgement:  Good  Insight:  Present  Psychomotor Activity:  NA  Concentration:  Concentration: Good and Attention Span: Good  Recall:  Good  Fund of Knowledge:  Good  Language:  Good  Akathisia:  No  Handed:  Right  AIMS (if indicated):     Assets:  Communication Skills Desire  for Improvement Housing Resilience Transportation  ADL's:  Intact  Cognition:  WNL  Sleep:   fair      Assessment and Plan: Bipolar disorder type I.  Generalized anxiety disorder.  I reviewed blood work results.  Patient is experiencing increased anxiety because of health issues and his son.  He is scheduled to see Dr. Sedonia Small for joint pain and swelling.  Encouraged to continue therapy with [name].  Continue Lamictal 300 mg daily and lorazepam increase to 0.5 mg to take twice a day as needed and continue Ambien 5 to 10 mg as needed for insomnia.  Discuss benzodiazepine and hypnotics side effects, tolerance and withdrawal symptoms.  Recommended to call us back if is any question or any concern.  Follow-up in 3 months.  Follow Up Instructions:    I discussed the assessment and treatment plan with the patient. The patient was provided an opportunity to ask questions and all were answered. The patient agreed with the plan and demonstrated an understanding of the instructions.   The patient was advised to call back or seek an in-person evaluation if the symptoms worsen or if the condition fails to improve as anticipated.  I provided  15 minutes of non-face-to-face time during this encounter.   Kathlee Nations, MD

## 2020-03-12 ENCOUNTER — Ambulatory Visit
Admission: RE | Admit: 2020-03-12 | Discharge: 2020-03-12 | Disposition: A | Discharge status: Home / Self Care | Payer: No Typology Code available for payment source | Source: Ambulatory Visit | Attending: Rehabilitation | Admitting: Rehabilitation

## 2020-03-12 ENCOUNTER — Other Ambulatory Visit: Payer: Self-pay

## 2020-03-12 DIAGNOSIS — M4326 Fusion of spine, lumbar region: Secondary | ICD-10-CM

## 2020-03-12 MED ORDER — GADOBENATE DIMEGLUMINE 529 MG/ML IV SOLN
20.0000 mL | Freq: Once | INTRAVENOUS | Status: AC | PRN
  Administered 2020-03-12: 20 mL via INTRAVENOUS

## 2020-03-16 ENCOUNTER — Other Ambulatory Visit: Payer: No Typology Code available for payment source

## 2020-03-27 ENCOUNTER — Other Ambulatory Visit: Payer: Self-pay

## 2020-03-27 ENCOUNTER — Encounter (HOSPITAL_COMMUNITY): Payer: Self-pay | Admitting: Licensed Clinical Social Worker

## 2020-03-27 ENCOUNTER — Ambulatory Visit (INDEPENDENT_AMBULATORY_CARE_PROVIDER_SITE_OTHER): Payer: No Typology Code available for payment source | Admitting: Licensed Clinical Social Worker

## 2020-03-27 DIAGNOSIS — F411 Generalized anxiety disorder: Secondary | ICD-10-CM | POA: Diagnosis not present

## 2020-03-27 DIAGNOSIS — F319 Bipolar disorder, unspecified: Secondary | ICD-10-CM

## 2020-03-27 NOTE — Progress Notes (Addendum)
Virtual Visit Note  I connected with Alex [name] on 03/06/2020 at 2:00pm EDT by phone-enabled virtual visit. I verified that I am speaking with the correct person using two identifiers.I discussed the limitations of evaluation and management by telemedicine and the availability of in person appointments. The patient expressed understanding and agreed to proceed.  LOCATION: Patient:Home Provider: Home office  History of Present Illness:  Pt was referred by Dr. Adele Schilder for OP therapy for bipolar disorder.    Observations/Objective: Pt presents  depressed and anxious for phone enable virtual therapy session. Pt discussed his psychiatric symptoms and current life events. Signed the tx plan from 02/21/2020. Patient reports he continues to have depressive symptoms due to his  continuing stressors: his son who is a habitual self mutilation and passive suicide and his adult daughter who still lives at home, works sporadically, is emotionally abusive to family members. [name] explored acceptance with patient. [name] discussed the negative effects of all this stress on his body and long-term effects.      Follow Up Instructions:  I discussed the assessment and treatment plan with the patient. The patient was provided an opportunity to ask questions and all were answered. The patient agreed with the plan and demonstrated an understanding of the instructions.   The patient was advised to call back or seek an in-person evaluation if the symptoms worsen or if the condition fails to improve as anticipated.  I provided 60 minutes of non-face-to-face time during this encounter.   Alex [name], LCAS

## 2020-04-03 ENCOUNTER — Ambulatory Visit (INDEPENDENT_AMBULATORY_CARE_PROVIDER_SITE_OTHER): Payer: No Typology Code available for payment source | Admitting: Licensed Clinical Social Worker

## 2020-04-03 ENCOUNTER — Other Ambulatory Visit: Payer: Self-pay | Admitting: Cardiology

## 2020-04-03 ENCOUNTER — Other Ambulatory Visit: Payer: Self-pay

## 2020-04-03 ENCOUNTER — Encounter (HOSPITAL_COMMUNITY): Payer: Self-pay | Admitting: Licensed Clinical Social Worker

## 2020-04-03 DIAGNOSIS — F319 Bipolar disorder, unspecified: Secondary | ICD-10-CM

## 2020-04-03 DIAGNOSIS — F411 Generalized anxiety disorder: Secondary | ICD-10-CM | POA: Diagnosis not present

## 2020-04-03 NOTE — Progress Notes (Signed)
Virtual Visit Note  I connected with Alex [name] on 04/03/2020 at 1:00pm EDT by video-enabled virtual visit. I verified that I am speaking with the correct person using two identifiers.I discussed the limitations of evaluation and management by telemedicine and the availability of in person appointments. The patient expressed understanding and agreed to proceed.  LOCATION: Patient:Home Provider: Home office  History of Present Illness:  Pt was referred by Dr. Adele Schilder for OP therapy for bipolar disorder and anxiety.    Observations/Objective: Pt presents  depressed and anxious for phone enable virtual therapy session. Pt discussed his psychiatric symptoms and current life events.Patient reports he continues to have depressive symptoms due to his  continuing stressors: his son who is a habitual self mutilation and passive suicide and his adult daughter who still lives at home, works sporadically, is emotionally abusive to family members. Again, discussed acceptance with patient. "Acceptance depresses me." Clinician utilized MI OARS to reflect and summarize thoughts and feelings. Patient has been babysitting his granddaughter which patient has enjoyed but he misses his wife. Explored his depressive symptoms,  with effective coping skills.     Follow Up Instructions:  I discussed the assessment and treatment plan with the patient. The patient was provided an opportunity to ask questions and all were answered. The patient agreed with the plan and demonstrated an understanding of the instructions.   The patient was advised to call back or seek an in-person evaluation if the symptoms worsen or if the condition fails to improve as anticipated.  I provided 60 minutes of non-face-to-face time during this encounter.   Alex [name], LCAS

## 2020-04-11 ENCOUNTER — Encounter (HOSPITAL_COMMUNITY): Payer: Self-pay | Admitting: Licensed Clinical Social Worker

## 2020-04-11 ENCOUNTER — Ambulatory Visit (INDEPENDENT_AMBULATORY_CARE_PROVIDER_SITE_OTHER): Payer: No Typology Code available for payment source | Admitting: Licensed Clinical Social Worker

## 2020-04-11 ENCOUNTER — Other Ambulatory Visit: Payer: Self-pay

## 2020-04-11 DIAGNOSIS — F319 Bipolar disorder, unspecified: Secondary | ICD-10-CM

## 2020-04-11 DIAGNOSIS — F411 Generalized anxiety disorder: Secondary | ICD-10-CM

## 2020-04-11 NOTE — Progress Notes (Signed)
Virtual Visit Note  I connected with Alex [name] on 04/03/2020 at 1:00pm EDT by video-enabled virtual visit. I verified that I am speaking with the correct person using two identifiers.I discussed the limitations of evaluation and management by telemedicine and the availability of in person appointments. The patient expressed understanding and agreed to proceed.  LOCATION: Patient:Home Provider: Home office  History of Present Illness:  Pt was referred by Dr. Adele Schilder for OP therapy for bipolar disorder and anxiety.    Observations/Objective: Pt presents  depressed and anxious for phone enable virtual therapy session. Pt discussed his psychiatric symptoms and current life events.Patient reports he continues to have depressive symptoms due to his  continuing stressors: his son who is a habitual self mutilation and passive suicide and his adult daughter who still lives at home, works sporadically, is emotionally abusive to family members. "I'm living in fear that my son will lose his job." Chiropodist questions. Helped patient "play the record through." "what if he loses his job, what next? How does this affect you? How can you prevent him from losing his job." What can you do to support your son? Clinician utilized MI OARS to reflect and summarize thoughts and feelings.   Follow Up Instructions:  I discussed the assessment and treatment plan with the patient. The patient was provided an opportunity to ask questions and all were answered. The patient agreed with the plan and demonstrated an understanding of the instructions.   The patient was advised to call back or seek an in-person evaluation if the symptoms worsen or if the condition fails to improve as anticipated.  I provided 60 minutes of non-face-to-face time during this encounter.   Shahidah Nesbitt S, LCAS

## 2020-04-24 ENCOUNTER — Other Ambulatory Visit: Payer: Self-pay | Admitting: Internal Medicine

## 2020-04-26 ENCOUNTER — Other Ambulatory Visit: Payer: Self-pay

## 2020-04-26 ENCOUNTER — Encounter (INDEPENDENT_AMBULATORY_CARE_PROVIDER_SITE_OTHER): Payer: No Typology Code available for payment source | Admitting: Ophthalmology

## 2020-04-26 DIAGNOSIS — H3521 Other non-diabetic proliferative retinopathy, right eye: Secondary | ICD-10-CM | POA: Diagnosis not present

## 2020-04-26 DIAGNOSIS — I10 Essential (primary) hypertension: Secondary | ICD-10-CM | POA: Diagnosis not present

## 2020-04-26 DIAGNOSIS — H35033 Hypertensive retinopathy, bilateral: Secondary | ICD-10-CM

## 2020-04-26 DIAGNOSIS — H43813 Vitreous degeneration, bilateral: Secondary | ICD-10-CM

## 2020-05-08 ENCOUNTER — Other Ambulatory Visit: Payer: Self-pay

## 2020-05-08 ENCOUNTER — Ambulatory Visit (INDEPENDENT_AMBULATORY_CARE_PROVIDER_SITE_OTHER): Payer: No Typology Code available for payment source | Admitting: Licensed Clinical Social Worker

## 2020-05-08 DIAGNOSIS — F3162 Bipolar disorder, current episode mixed, moderate: Secondary | ICD-10-CM | POA: Diagnosis not present

## 2020-05-10 ENCOUNTER — Ambulatory Visit: Payer: No Typology Code available for payment source | Admitting: Internal Medicine

## 2020-05-15 ENCOUNTER — Encounter (HOSPITAL_COMMUNITY): Payer: Self-pay | Admitting: Licensed Clinical Social Worker

## 2020-05-15 NOTE — Progress Notes (Signed)
Virtual Visit Note  I connected with Alex [name] on 05/08/2020 at 1:00pm EDT by video-enabled virtual visit. I verified that I am speaking with the correct person using two identifiers.I discussed the limitations of evaluation and management by telemedicine and the availability of in person appointments. The patient expressed understanding and agreed to proceed.  LOCATION: Patient:Home Provider: Home office  History of Present Illness:  Pt was referred by Dr. Adele Schilder for OP therapy for bipolar disorder and anxiety.    Observations/Objective: Pt presents  depressed and anxious for phone enable virtual therapy session. Pt discussed his psychiatric symptoms and current life events.Patient reports he continues to have depressive symptoms due to his  continuing stressors: his son who is a habitual self mutilation and passive suicide and his adult daughter who still lives at home, works sporadically, is emotionally abusive to family members.Patient is just returning from [place] beach. Before they left, his son was terminated from his job, patient's worse fear. Used socratic questions. Patient's son handled it better than he thought however son used self harm while patient was at the beach. Patient is beginning to accept this as his life. Discussed alternatives.   Follow Up Instructions:  I discussed the assessment and treatment plan with the patient. The patient was provided an opportunity to ask questions and all were answered. The patient agreed with the plan and demonstrated an understanding of the instructions.   The patient was advised to call back or seek an in-person evaluation if the symptoms worsen or if the condition fails to improve as anticipated.  I provided 60 minutes of non-face-to-face time during this encounter.   Burnadette Baskett S, LCAS

## 2020-05-22 ENCOUNTER — Ambulatory Visit (HOSPITAL_COMMUNITY): Payer: No Typology Code available for payment source | Admitting: Licensed Clinical Social Worker

## 2020-05-22 ENCOUNTER — Other Ambulatory Visit (HOSPITAL_COMMUNITY): Payer: Self-pay | Admitting: Psychiatry

## 2020-05-22 DIAGNOSIS — F411 Generalized anxiety disorder: Secondary | ICD-10-CM

## 2020-05-23 ENCOUNTER — Ambulatory Visit (HOSPITAL_COMMUNITY): Payer: No Typology Code available for payment source | Admitting: Licensed Clinical Social Worker

## 2020-05-24 ENCOUNTER — Other Ambulatory Visit: Payer: Self-pay

## 2020-05-24 ENCOUNTER — Ambulatory Visit (INDEPENDENT_AMBULATORY_CARE_PROVIDER_SITE_OTHER): Payer: No Typology Code available for payment source | Admitting: Licensed Clinical Social Worker

## 2020-05-24 ENCOUNTER — Encounter (HOSPITAL_COMMUNITY): Payer: Self-pay | Admitting: Licensed Clinical Social Worker

## 2020-05-24 DIAGNOSIS — F411 Generalized anxiety disorder: Secondary | ICD-10-CM | POA: Diagnosis not present

## 2020-05-24 NOTE — Progress Notes (Signed)
Virtual Phone Visit Note  I connected with Alex [name] on 05/24/2020 at 4:00pm EDT by video-enabled virtual visit. I verified that I am speaking with the correct person using two identifiers.I discussed the limitations of evaluation and management by telemedicine and the availability of in person appointments. The patient expressed understanding and agreed to proceed. Patient didn't have privacy for a video-enabled virtual visit.  LOCATION: Patient:Home Provider: Home office  History of Present Illness:  Pt was referred by Dr. Adele Schilder for OP therapy for bipolar disorder and anxiety.    Observations/Objective: Pt presents  depressed and anxious for phone enabled virtual therapy session. Pt discussed his psychiatric symptoms and current life events.Patient reports he continues to have depressive symptoms due to his  continued stressors. Patient reports on current health issues. Encouraged patient to continue with health monitoring. Patient's son continues with his self-harming which causes patient to go in a downward spiral. Continue to work with patient on acceptance of his life, his choice. Patient discusses his coping skills he uses that work for his anxiety and depressive symptoms. Practiced coping skills in session.      Follow Up Instructions:  I discussed the assessment and treatment plan with the patient. The patient was provided an opportunity to ask questions and all were answered. The patient agreed with the plan and demonstrated an understanding of the instructions.   The patient was advised to call back or seek an in-person evaluation if the symptoms worsen or if the condition fails to improve as anticipated.  I provided 60 minutes of non-face-to-face time during this encounter.   Gianina Olinde S, LCAS

## 2020-05-29 ENCOUNTER — Encounter (HOSPITAL_COMMUNITY): Payer: Self-pay | Admitting: Licensed Clinical Social Worker

## 2020-05-29 ENCOUNTER — Ambulatory Visit (INDEPENDENT_AMBULATORY_CARE_PROVIDER_SITE_OTHER): Payer: No Typology Code available for payment source | Admitting: Licensed Clinical Social Worker

## 2020-05-29 ENCOUNTER — Other Ambulatory Visit: Payer: Self-pay

## 2020-05-29 DIAGNOSIS — F3162 Bipolar disorder, current episode mixed, moderate: Secondary | ICD-10-CM

## 2020-05-29 NOTE — Progress Notes (Signed)
Virtual Video Visit Note  I connected with Jessen Siegman Brill on 05/29/2020 at 1:00pm EDT by video-enabled virtual visit. I verified that I am speaking with the correct person using two identifiers.I discussed the limitations of evaluation and management by telemedicine and the availability of in person appointments. The patient expressed understanding and agreed to proceed.   LOCATION: Patient:Home Provider: Home office  History of Present Illness:  Pt was referred by Dr. Adele Schilder for OP therapy for bipolar disorder and anxiety.    Observations/Objective: Pt presents  depressed and anxious for video enabled virtual therapy session. Pt discussed his psychiatric symptoms and current life events. Patient reports he continues to have depressive symptoms due to his  continued stressors. Reviewed coping tools with patient. Patient's son continues with his self-harming which causes patient to in a downward spiral downward. Patient's son is currently in a psychiatric hospital in [place]. Challenged patient to let go of protecting his son, taking control of his life, picking up the pieces of his son's life. Role played with his patient how to let go.  Follow Up Instructions:  I discussed the assessment and treatment plan with the patient. The patient was provided an opportunity to ask questions and all were answered. The patient agreed with the plan and demonstrated an understanding of the instructions.   The patient was advised to call back or seek an in-person evaluation if the symptoms worsen or if the condition fails to improve as anticipated.  I provided 60 minutes of non-face-to-face time during this encounter.   Lavalle Skoda S, LCAS

## 2020-06-02 NOTE — Progress Notes (Signed)
Virtual Visit via Video Note changed to phone visit at patient request.   This visit type was conducted due to national recommendations for restrictions regarding the COVID-19 Pandemic (e.g. social distancing) in an effort to limit this patient's exposure and mitigate transmission in our community.  Due to his co-morbid illnesses, this patient is at least at moderate risk for complications without adequate follow up.  This format is felt to be most appropriate for this patient at this time.  All issues noted in this document were discussed and addressed.  A limited physical exam was performed with this format.  Please refer to the patient's chart for his consent to telehealth for [facility].      Date:  06/08/2020   ID:  Mahkai Pitones [name], DOB 01/12/1956, MRN [id number]  Patient Location:Home Provider Location: Home  PCP:  Tresa Garter, MD  Cardiologist:  Dr Jens Som  Evaluation Performed:  Follow-Up Visit  Chief Complaint:  FU CAD  History of Present Illness:    FU CAD. Cath in 2007 showed a 40 LAD; EF 50-55. Had normal ETT 7/11 with 10:00 on Bruce. Echocardiogram December 2012 showed normal LV function and grade 1 diastolic dysfunction. Holter monitor in January 2014 showed sinus rhythm, PACs, PVCs and brief PAT.MRA December 2020 showed ascending thoracic aorta measuring 43 mm.  Since he was last seen,patient denies dyspnea, chest pain, palpitations or syncope.  The patient does not have symptoms concerning for COVID-19 infection (fever, chills, cough, or new shortness of breath).    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   Cataract    LEFT EYE   Chronic back pain    Complication of anesthesia POST URINARY RETENTION---  2006 SHOULDER SURGERY MARKED BRADYCARDIA VAGAL RESPONSE NO ISSUE W/ SURGERY AFTER THIS ONE   WITH GENERAL ANESTHESIA, 15 YRS AGO VASOVAGAL REACTION NONE SINCE   Corneal hemorrhage 06/03/2018   Entire left eye   Coronary atherosclerosis CARDIOLOGIST- DR Birch Farino--  LAST VISIT 01-05-2012 IN EPIC   NON-OBSTRUCTIVE MILD DISEASE   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HCC)    Depression    Epicondylitis    right elbow   GERD (gastroesophageal reflux disease)    Glaucoma BOTH EYES   RIGHT EYE RADIATION DAMAGE   Hearing loss    BOTH EARS   Hearing loss    Bilateral   Hepatic cyst    Several, The lesion of concern in segment 6 of the liver has single large portal vein and hepatic vein branches extending to tt, in a pattern of enhancement which mirrors these vascular structures. 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 9 CM X 4. 3 CM PER NOV 06-14-17  CHEST CTFOLOWED BY DR Jens Som YEARLY FOR   Positional vertigo    HX OF WITH SINUS INFECTIONS   Radial head fracture    Right   Squamous cell carcinoma of skin 06/04/2016   in situ-crown of scalp   Squamous cell carcinoma of skin 04/22/2017   in situ-crown scalp (txpbx)   Thoracic aortic aneurysm (HCC) 06/14/2017   last CT 4.1 CM Mild   Tinnitus    CONSTANT   Ulnar nerve compression    right elbow   Unsteady gait    especially with stairs, depth perception off   Urinary hesitancy    Wears glasses    Past Surgical History:  Procedure Laterality Date   CARDIAC CATHETERIZATION  01-16-2006  DR Maisie Fus WALL   MILD CORONARY ATHEROSCLEROSIS/ MID TO DISTAL LAD 40% STENOSIS/ LVF 50-55%   CARPAL TUNNEL RELEASE Right 11/03/2017   Procedure: RIGHT HAND CARPAL TUNNEL RELEASE;  Surgeon: Bradly Bienenstock, MD;  Location: [facility];  Service: Orthopedics;  Laterality: Right;   CATARACT EXTRACTION Right    COLONSCOPY  2017 LAST DONE   MULTIPLE   ENDOSCOPY  LAST 2017   MULTIPLE DONE DILATION DONE ALSO   ESOPHAGOGASTRODUODENOSCOPY (EGD) WITH PROPOFOL N/A 02/24/2018   Procedure: ESOPHAGOGASTRODUODENOSCOPY (EGD) WITH PROPOFOL;  Surgeon: Carman Ching, MD;  Location: [facility];  Service: Endoscopy;  Laterality: N/A;   EXCISION RADIAL HEAD Right 11/03/2017   Procedure: RIGHT PROXIMAL RADIUS RADIAL HEAD RESECTION AND JOINT DEBRIDEMENT;  Surgeon: Bradly Bienenstock, MD;  Location: [facility];  Service: Orthopedics;  Laterality: Right;   KNEE ARTHROSCOPY  05/01/2012   Procedure: ARTHROSCOPY KNEE;  Surgeon: Javier Docker, MD;  Location: [facility];  Service: Orthopedics;  Laterality: Left;  debridement and removal of loose body   LEFT ANKLE ARTHROSCOPY W/ DEBRIDEMENT  05-12-2007   LEFT HYDROCELECTOMY  03-29-2005   AND  REPAIR LEFT INGUINAL HERNIA W/ MESH   MOHS SURGERY  2002   RIGHT ORBITAL CANCER   NASAL ENDOSCOPY  08-07-2005   RIGHT EPISTAXIS  / POST SEPTOPLASTY  (HX RIGHT ORBITAL CA & S/P RADIATION/ NECROSIS ANTERIOR END OF BOTH INFERIOR TURBINATES)   occuloplastic surgery  2002   PARS PLANA VITRECTOMY  11-06-2004   RIGHT EYE RADIATION RETINOPATHY W/ HEMORRHAGE   RADIAL HEAD ARTHROPLASTY Right 06/15/2018   Procedure: RIGHT ELBOW PROXIMAL RADIOULNAR JOINT DEBRIDEMENT AND ARTHROPLASTY;  Surgeon: Bradly Bienenstock, MD;  Location: [facility];  Service: Orthopedics;  Laterality: Right;  BLOCK WITH SEDATION   REPAIR UNDESENDED RIGHT TESTICLE / RIGHT INGUINAL HERNIA  AGE 34   RIGHT ANKLE ARTHROSCOPY W/ EXTENSIVE DEBRIDEMENT  04/05/2008   x2   RIGHT SHOULDER SURGERY  2006   RIGHT SUPRAOMOHYOID  NECK DISSECTION   03-08-2003   ZONES 1,2,3;   SUBMANDIBULAR MASS / METASTATIC SQUAMOUS CELL CARCINOMA RIGHT NECK   SAVORY DILATION N/A 02/24/2018   Procedure: SAVORY DILATION;  Surgeon: Carman Ching, MD;  Location: [facility];  Service: Endoscopy;  Laterality: N/A;   SEPTOPLASTY  NOV 2006   SHOULDER ARTHROSCOPY Left    SHOULDER ARTHROSCOPY W/ SUBACROMIAL DECOMPRESSION AND DISTAL CLAVICLE EXCISION  10-09-2008   AND DEBRIDEMENT OF RIGHT SHOULDER IMPINGEMENT & [facility] JOINT ARTHRITIS   SPINE SURGERY  2016   l 3 TO l 4 PLATE AND SCREWS   TOTAL THYROIDECTOMY  11-03-2001   PAPILLARY THYROID CARCINOMA   TRANSTHORACIC ECHOCARDIOGRAM  12/ 2012   grade I diastolic dysfunction/ ef 55-60%   ULNAR NERVE TRANSPOSITION Right 04/28/2014   Procedure: RIGHT ELBOW ULNA NERVE RELEASE TRANSPOSTION AND MEDIAL EPICONDYLAR DEBRIDEMENT AND REPAIR;  Surgeon: Sharma Covert, MD;  Location: [facility];  Service: Orthopedics;  Laterality: Right;     Current Meds  Medication Sig   acetaminophen (TYLENOL) 500 MG tablet Take 1,000 mg by mouth 2 (two) times daily as needed for headache.   albuterol  (PROAIR HFA) 108 (90 Base) MCG/ACT inhaler Inhale 2 puffs into the lungs every 6 (six) hours as needed for wheezing or shortness of breath.   aspirin EC 81 MG tablet Take 1 tablet by mouth daily.   AXIRON 30 MG/ACT SOLN Place 2 Act onto the skin daily.   Coenzyme Q10 (CO Q 10 PO) Take 1 capsule by mouth daily.    Immune Globulin, Human, (HIZENTRA) 4 GM/20ML SOLN Inject into the skin once a week. wednesday   LamoTRIgine 300 MG TB24 24 hour tablet Take 1 tablet (300 mg total) by mouth every morning.   latanoprost (XALATAN) 0.005 % ophthalmic solution 1 DROP EACH EYE AT NIGHT   levothyroxine (SYNTHROID) 150 MCG tablet TAKE 1 TABLET BY MOUTH EVERY DAY BEFORE BREAKFAST   LORazepam (ATIVAN) 0.5 MG tablet Take 1 tablet (0.5 mg total) by mouth 2 (two) times daily.   LUTEIN PO Take 1 tablet by mouth daily.    LYCOPENE PO Take by mouth.   meloxicam (MOBIC) 15 MG tablet Take 1 tablet (15 mg total) by mouth daily as needed.   Multiple Vitamin (MULTI-VITAMINS) TABS Take by mouth.   OMEGA-3 KRILL OIL PO Take 1 capsule by mouth daily.    omeprazole (PRILOSEC) 20 MG capsule Take 20 mg by mouth every evening.   sildenafil (REVATIO) 20 MG tablet TAKE 1 po qd   sucralfate (CARAFATE) 1 G tablet Take 1 g by mouth daily as needed (ACID REFLUX).    tadalafil (CIALIS) 20 MG tablet TAKE 1 TABLET BY MOUTH ONCE DAILY AS NEEDED FOR ERECTILE DYSFUNCTION   tamsulosin (FLOMAX) 0.4 MG CAPS capsule TAKE 1 CAPSULE BY MOUTH EVERY DAY (Patient taking differently: daily as needed. )   telmisartan (MICARDIS) 80 MG tablet TAKE 1 TABLET BY MOUTH EVERY DAY   timolol (TIMOPTIC) 0.5 % ophthalmic solution timolol maleate 0.5 % eye drops  INSTILL 1 DROP INTO RIGHT EYE TWICE A DAY   traMADol (ULTRAM) 50 MG tablet Take 50-100 mg by mouth every 6 (six) hours as needed.   zolpidem (AMBIEN) 10 MG tablet Take 1/2 tab at bed time and take other half if needed     Allergies:   Percocet [oxycodone-acetaminophen] and  Vicodin [hydrocodone-acetaminophen]   Social History   Tobacco Use   Smoking status: Never Smoker   Smokeless tobacco: Never Used  Vaping Use   Vaping Use: Never used  Substance Use Topics   Alcohol use: Yes    Alcohol/week: 0.0 standard drinks    Comment: 1-2 glasses per month   Drug use: No     Family Hx: The patient's family history includes COPD in his father; Depression in his daughter, mother, and sister; Drug abuse in his daughter; Hypertension in his father; Kidney disease in his mother; Lung cancer in his brother; Prostate cancer in his father; Psychiatric Illness in his son; Rectal cancer in his sister; Stroke in his father.  ROS:   Please see the history of present illness.    Arthralgias but no Fever, chills  or productive cough All other systems reviewed and are negative.   Recent Labs: 02/15/2020: TSH 5.26 02/22/2020: ALT 34; BUN 17; Creatinine, Ser 1.12; Hemoglobin 16.2; Platelets 235.0; Potassium 4.1; Sodium 139   Recent Lipid Panel Lab Results  Component Value Date/Time   CHOL 144 02/22/2020 02:36 PM   CHOL 163 05/31/2019 10:00 AM   TRIG 149.0 02/22/2020 02:36 PM   TRIG 95 06/04/2006 08:25 AM   HDL 53.90 02/22/2020 02:36 PM   HDL 75 05/31/2019 10:00 AM   CHOLHDL 3 02/22/2020 02:36 PM   LDLCALC 60 02/22/2020 02:36 PM   LDLCALC 73 05/31/2019 10:00 AM   LDLDIRECT 59.0 02/02/2016 02:25 PM    Wt Readings from Last 3 Encounters:  06/08/20 178 lb (80.7 kg)  02/22/20 182 lb (82.6 kg)  01/25/20 182 lb (82.6 kg)     Objective:    Vital Signs:  BP (!) 139/94    Pulse 85    Ht 6' (1.829 m)    Wt 178 lb (80.7 kg)    BMI 24.14 kg/m    VITAL SIGNS:  reviewed NAD Answers questions appropriately Normal affect Remainder of physical examination not performed (telehealth visit; coronavirus pandemic)  ASSESSMENT & PLAN:    1. Coronary artery disease-patient denies chest pain.  Plan to continue aspirin.  His Lipitor was discontinued previously as it was  thought to be contributing to arthralgias.  However these have not significantly improved.  Will resume Lipitor at 40 mg daily. 2. Thoracic aortic aneurysm-plan follow-up CTA December 2021. 3. Hypertension-blood pressure mildly elevated.  He just recently began amlodipine.  Follow blood pressure and increase regimen as needed. 4. Hyperlipidemia-resume Lipitor 40 mg daily.  Check lipids and liver in 12 weeks. 5. History of right portal vein shunt-[facility] at [facility].  COVID-19 Education: The importance of social distancing was discussed today.  Time:   Today, I have spent 17 minutes with the patient with telehealth technology discussing the above problems.     Medication Adjustments/Labs and Tests Ordered: Current medicines are reviewed at length with the patient today.  Concerns regarding medicines are outlined above.   Tests Ordered: No orders of the defined types were placed in this encounter.   Medication Changes: No orders of the defined types were placed in this encounter.   Follow Up:  In Person in 6 month(s)  Signed, Olga Millers, MD  06/08/2020 8:47 AM    [facility]

## 2020-06-05 ENCOUNTER — Encounter (HOSPITAL_COMMUNITY): Payer: Self-pay | Admitting: Psychiatry

## 2020-06-05 ENCOUNTER — Other Ambulatory Visit: Payer: Self-pay

## 2020-06-05 ENCOUNTER — Telehealth (INDEPENDENT_AMBULATORY_CARE_PROVIDER_SITE_OTHER): Payer: No Typology Code available for payment source | Admitting: Psychiatry

## 2020-06-05 DIAGNOSIS — F411 Generalized anxiety disorder: Secondary | ICD-10-CM | POA: Diagnosis not present

## 2020-06-05 DIAGNOSIS — F319 Bipolar disorder, unspecified: Secondary | ICD-10-CM

## 2020-06-05 MED ORDER — LAMOTRIGINE ER 300 MG PO TB24: 1.0000 | ORAL_TABLET | ORAL | 0 refills | Status: DC

## 2020-06-05 MED ORDER — ZOLPIDEM TARTRATE 10 MG PO TABS: ORAL_TABLET | ORAL | 1 refills | Status: DC

## 2020-06-05 MED ORDER — LORAZEPAM 0.5 MG PO TABS: 0.5000 mg | ORAL_TABLET | Freq: Two times a day (BID) | ORAL | 2 refills | Status: DC

## 2020-06-05 NOTE — Progress Notes (Signed)
Virtual Visit via Telephone Note  I connected with Alex [name] on 06/05/20 at  1:00 PM EDT by telephone and verified that I am speaking with the correct person using two identifiers.  Location: Patient: home Provider: home office   I discussed the limitations, risks, security and privacy concerns of performing an evaluation and management service by telephone and the availability of in person appointments. I also discussed with the patient that there may be a patient responsible charge related to this service. The patient expressed understanding and agreed to proceed.   History of Present Illness: Patient is evaluated by phone session.  He is sad because his son [name] lost his job because he was again admitted in the hospital after ingesting razors.  Patient is sad because his son's job was very stable for him and now he is concerned that he may not get another job.  Patient has chronic family issues but he is taking his medication and he feels things are going okay.  He had a family vacation in [name] which went well.  He went to see his rheumatologist and he was told to stop the statin that may be causing the joint pain and since he stopped his joint pain is better.  However he will get blood work to see if his cholesterol is under control.  He is in therapy with [name].  He is sleeping okay and he takes 5 mg Ambien every night with low-dose lorazepam.  Denies any crying spells or any feeling of hopelessness or worthlessness.  He denies any paranoia, hallucination.  Denies any highs and lows, rash or any itching.  He is no longer taking steroids.  He went to keep his current medication which is helping his mood, anxiety.  His weight is a stable.  Past Psychiatric History:Reviewed. [facility] but[facility]. No h/opsychosis buth/o poor impulse control. In the past tookSeroquel,Cymbaltaand  Remeron (increase depression)  Psychiatric Specialty Exam: Physical Exam  Review of Systems  Weight 181 lb (82.1 kg).There is no height or weight on file to calculate BMI.  General Appearance: NA  Eye Contact:  NA  Speech:  Normal Rate  Volume:  Normal  Mood:  Anxious  Affect:  NA  Thought Process:  Goal Directed  Orientation:  Full (Time, Place, and Person)  Thought Content:  Rumination  Suicidal Thoughts:  No  Homicidal Thoughts:  No  Memory:  Immediate;   Good Recent;   Good Remote;   Good  Judgement:  Good  Insight:  Present  Psychomotor Activity:  NA  Concentration:  Concentration: Good and Attention Span: Good  Recall:  Good  Fund of Knowledge:  Good  Language:  Good  Akathisia:  No  Handed:  Right  AIMS (if indicated):     Assets:  Communication Skills Desire for Improvement Housing Transportation  ADL's:  Intact  Cognition:  WNL  Sleep:   ok      Assessment and Plan: Bipolar disorder type I.  Generalized anxiety disorder.  Patient is a stable on his medication despite multiple family concerns.  He does not want to change medication.  Continue lorazepam 0.5 mg to take at bedtime with Ambien 5 mg and second if needed.  Continue Lamictal 300 mg daily.  He has no rash, itching tremors or shakes.  Encouraged to continue therapy with [name].  Recommended to call us back if there is any question or any concern.  Follow-up in  3 months.  Follow Up Instructions:    I discussed the assessment and treatment plan with the patient. The patient was provided an opportunity to ask questions and all were answered. The patient agreed with the plan and demonstrated an understanding of the instructions.   The patient was advised to call back or seek an in-person evaluation if the symptoms worsen or if the condition fails to improve as anticipated.  I provided 18 minutes of non-face-to-face time during this encounter.   Kathlee Nations, MD

## 2020-06-08 ENCOUNTER — Telehealth (INDEPENDENT_AMBULATORY_CARE_PROVIDER_SITE_OTHER): Payer: No Typology Code available for payment source | Admitting: Cardiology

## 2020-06-08 ENCOUNTER — Encounter: Payer: Self-pay | Admitting: Cardiology

## 2020-06-08 VITALS — BP 139/94 | HR 85 | Ht 72.0 in | Wt 178.0 lb

## 2020-06-08 DIAGNOSIS — I2510 Atherosclerotic heart disease of native coronary artery without angina pectoris: Secondary | ICD-10-CM | POA: Diagnosis not present

## 2020-06-08 DIAGNOSIS — I712 Thoracic aortic aneurysm, without rupture, unspecified: Secondary | ICD-10-CM

## 2020-06-08 DIAGNOSIS — I10 Essential (primary) hypertension: Secondary | ICD-10-CM

## 2020-06-08 MED ORDER — ATORVASTATIN CALCIUM 40 MG PO TABS: 40.0000 mg | ORAL_TABLET | Freq: Every day | ORAL | 3 refills | Status: DC

## 2020-06-08 NOTE — Patient Instructions (Signed)
Medication Instructions:   RESTART ATORVASTATIN 40 MG ONCE DAILY  *If you need a refill on your cardiac medications before your next appointment, please call your pharmacy*   Lab Work:  Your physician recommends that you return for lab work in: [facility]  If you have labs (blood work) drawn today and your tests are completely normal, you will receive your results only by: [name] MyChart Message (if you have MyChart) OR . A paper copy in the mail If you have any lab test that is abnormal or we need to change your treatment, we will call you to review the results.   Follow-Up: At [facility], you and your health needs are our priority.  As part of our continuing mission to provide you with exceptional heart care, we have created designated Provider Care Teams.  These Care Teams include your primary Cardiologist (physician) and Advanced Practice Providers (APPs -  Physician Assistants and Nurse Practitioners) who all work together to provide you with the care you need, when you need it.  We recommend signing up for the patient portal called "MyChart".  Sign up information is provided on this After Visit Summary.  MyChart is used to connect with patients for Virtual Visits (Telemedicine).  Patients are able to view lab/test results, encounter notes, upcoming appointments, etc.  Non-urgent messages can be sent to your provider as well.   To learn more about what you can do with MyChart, go to [email or web address].    Your next appointment:   6 month(s)  The format for your next appointment:   In Person  Provider:   [name], MD

## 2020-06-12 ENCOUNTER — Ambulatory Visit (INDEPENDENT_AMBULATORY_CARE_PROVIDER_SITE_OTHER): Payer: No Typology Code available for payment source | Admitting: Licensed Clinical Social Worker

## 2020-06-12 ENCOUNTER — Encounter (HOSPITAL_COMMUNITY): Payer: Self-pay | Admitting: Licensed Clinical Social Worker

## 2020-06-12 ENCOUNTER — Other Ambulatory Visit: Payer: Self-pay

## 2020-06-12 DIAGNOSIS — F411 Generalized anxiety disorder: Secondary | ICD-10-CM | POA: Diagnosis not present

## 2020-06-12 DIAGNOSIS — F319 Bipolar disorder, unspecified: Secondary | ICD-10-CM | POA: Diagnosis not present

## 2020-06-12 NOTE — Progress Notes (Signed)
Virtual Video Visit Note  I connected with Alex [name] on 06/12/2020 at 1:00pm EDT by video-enabled virtual visit. I verified that I am speaking with the correct person using two identifiers.I discussed the limitations of evaluation and management by telemedicine and the availability of in person appointments. The patient expressed understanding and agreed to proceed.   LOCATION: Patient:Home Provider: Home office  History of Present Illness:  Pt was referred by Dr. Adele Schilder for OP therapy for bipolar disorder and anxiety.    Observations/Objective: Pt presents  depressed for video enabled virtual therapy session. Pt discussed his psychiatric symptoms and current life events. Patient reports he continues to have depressive symptoms due to his  continued stressors. Reviewed coping tools with patient. Patient's son continues with his self-harming which causes patient to go in a downward spiral..[name] assessed the family dynamics. [name] provided psychoeducation on dynamics of healthy vs unhealthy relationships associated with power and control. [name] validated and normalized patient's feelings while redirecting his focus to things that he can control,       Follow Up Instructions:  I discussed the assessment and treatment plan with the patient. The patient was provided an opportunity to ask questions and all were answered. The patient agreed with the plan and demonstrated an understanding of the instructions.   The patient was advised to call back or seek an in-person evaluation if the symptoms worsen or if the condition fails to improve as anticipated.  I provided 60 minutes of non-face-to-face time during this encounter.   Faithanne Verret S, LCAS

## 2020-06-19 ENCOUNTER — Ambulatory Visit (INDEPENDENT_AMBULATORY_CARE_PROVIDER_SITE_OTHER): Payer: No Typology Code available for payment source | Admitting: Licensed Clinical Social Worker

## 2020-06-19 ENCOUNTER — Encounter (HOSPITAL_COMMUNITY): Payer: Self-pay | Admitting: Licensed Clinical Social Worker

## 2020-06-19 ENCOUNTER — Other Ambulatory Visit: Payer: Self-pay

## 2020-06-19 DIAGNOSIS — F411 Generalized anxiety disorder: Secondary | ICD-10-CM

## 2020-06-19 DIAGNOSIS — F319 Bipolar disorder, unspecified: Secondary | ICD-10-CM

## 2020-06-19 NOTE — Progress Notes (Addendum)
Virtual Video Visit Note  I connected with Alex [name] on 06/19/2020 at 1:00pm EST by video-enabled virtual visit. I verified that I am speaking with the correct person using two identifiers.I discussed the limitations of evaluation and management by telemedicine and the availability of in person appointments. The patient expressed understanding and agreed to proceed.   LOCATION: Patient:Home Provider: Home office  History of Present Illness:  Pt was referred by Dr. Adele Schilder for OP therapy for bipolar disorder and anxiety.    Observations/Objective: Pt presents  depressed for video enabled virtual therapy session. Pt discussed his psychiatric symptoms and current life events. Reviewed tx plan with patient who verbalized acceptance of the plan. Patient reports he continues to have depressive symptoms. "i'm thinking about my past a lot." Patient was able to relay times from his past that he is not proud of. [name] provided practice, principles, techniques on mindfulness skills. Practiced in session.              Follow Up Instructions:  I discussed the assessment and treatment plan with the patient. The patient was provided an opportunity to ask questions and all were answered. The patient agreed with the plan and demonstrated an understanding of the instructions.   The patient was advised to call back or seek an in-person evaluation if the symptoms worsen or if the condition fails to improve as anticipated.  I provided 45 minutes of non-face-to-face time during this encounter.   Wilmary Levit S, LCAS

## 2020-06-26 ENCOUNTER — Other Ambulatory Visit: Payer: Self-pay

## 2020-06-26 ENCOUNTER — Ambulatory Visit (INDEPENDENT_AMBULATORY_CARE_PROVIDER_SITE_OTHER): Payer: No Typology Code available for payment source | Admitting: Licensed Clinical Social Worker

## 2020-06-26 ENCOUNTER — Encounter (HOSPITAL_COMMUNITY): Payer: Self-pay | Admitting: Licensed Clinical Social Worker

## 2020-06-26 DIAGNOSIS — F319 Bipolar disorder, unspecified: Secondary | ICD-10-CM

## 2020-06-26 DIAGNOSIS — F411 Generalized anxiety disorder: Secondary | ICD-10-CM | POA: Diagnosis not present

## 2020-06-26 DIAGNOSIS — R040 Epistaxis: Secondary | ICD-10-CM | POA: Insufficient documentation

## 2020-06-26 NOTE — Progress Notes (Signed)
Virtual Video Visit Note  I connected with Alex [name] on 06/19/2020 at 1:00pm EST by video-enabled virtual visit. I verified that I am speaking with the correct person using two identifiers.I discussed the limitations of evaluation and management by telemedicine and the availability of in person appointments. The patient expressed understanding and agreed to proceed.   LOCATION: Patient:Home Provider: Home office  History of Present Illness:  Pt was referred by Dr. Adele Schilder for OP therapy for bipolar disorder and anxiety.    Observations/Objective: Pt presents  depressed for video enabled virtual therapy session. Pt discussed his psychiatric symptoms and current life events. Patient reports he continues to have depressive symptoms. Patient relayed his son was in the hospital again, IVC, for self harm. He was discharged and entered a psychiatric program at [facility] in [place], [place]. Patient described his current stressors, including his son. What are you doing to minimize your stress? Clinician utilized MI OARS to affirm concerns. Clinician challenged thoughts about this. Clinician processed options for communicating her concerns Patient described coping skills.   Follow Up Instructions:  I discussed the assessment and treatment plan with the patient. The patient was provided an opportunity to ask questions and all were answered. The patient agreed with the plan and demonstrated an understanding of the instructions.   The patient was advised to call back or seek an in-person evaluation if the symptoms worsen or if the condition fails to improve as anticipated.  I provided 60 minutes of non-face-to-face time during this encounter.   Alex [name] S, LCAS

## 2020-07-03 ENCOUNTER — Ambulatory Visit (INDEPENDENT_AMBULATORY_CARE_PROVIDER_SITE_OTHER): Payer: No Typology Code available for payment source | Admitting: Licensed Clinical Social Worker

## 2020-07-03 ENCOUNTER — Other Ambulatory Visit: Payer: Self-pay

## 2020-07-03 ENCOUNTER — Encounter (HOSPITAL_COMMUNITY): Payer: Self-pay | Admitting: Licensed Clinical Social Worker

## 2020-07-03 DIAGNOSIS — F319 Bipolar disorder, unspecified: Secondary | ICD-10-CM | POA: Diagnosis not present

## 2020-07-03 DIAGNOSIS — F411 Generalized anxiety disorder: Secondary | ICD-10-CM | POA: Diagnosis not present

## 2020-07-03 NOTE — Progress Notes (Signed)
Virtual Video Visit Note  I connected with Alex [name] on 06/19/2020 at 1:00pm EST by video-enabled virtual visit. I verified that I am speaking with the correct person using two identifiers.I discussed the limitations of evaluation and management by telemedicine and the availability of in person appointments. The patient expressed understanding and agreed to proceed.   LOCATION: Patient:Home Provider: Home office  History of Present Illness:  Pt was referred by Dr. Adele Schilder for OP therapy for bipolar disorder and anxiety.    Observations/Objective: Pt presents  depressed for video enabled virtual therapy session. Pt discussed his psychiatric symptoms and  current life events. Patient reports he continues to have depressive symptoms. Patient discussed the difficulty communicating with his wife. Patient expressed his feelings about his wife, but doesn't feel the same from her. "I feel lonely and left out." [name] provided psychoeducation on communication errors. [name] role played with patient effective communication skills, using I statements. Patient gave an update on his son who is currently in a psychiatric facility,and his other adult daughter who lives in the home. Patient continues to be frustrated about the adult chlldren living in the home, but he and his wife can't come together to make a mutual decision concerning these children. Clinician utilized MI OARS to reflect and summarize thoughts and feelings.    Follow Up Instructions:  I discussed the assessment and treatment plan with the patient. The patient was provided an opportunity to ask questions and all were answered. The patient agreed with the plan and demonstrated an understanding of the instructions.   The patient was advised to call back or seek an in-person evaluation if the symptoms worsen or if the condition fails to improve as anticipated.  I provided 60 minutes of non-face-to-face time during this  encounter.   Priyal Musquiz S, LCAS

## 2020-07-04 ENCOUNTER — Encounter: Payer: Self-pay | Admitting: Internal Medicine

## 2020-07-10 ENCOUNTER — Encounter (HOSPITAL_COMMUNITY): Payer: Self-pay | Admitting: Licensed Clinical Social Worker

## 2020-07-10 ENCOUNTER — Other Ambulatory Visit: Payer: Self-pay

## 2020-07-10 ENCOUNTER — Ambulatory Visit (INDEPENDENT_AMBULATORY_CARE_PROVIDER_SITE_OTHER): Payer: No Typology Code available for payment source | Admitting: Licensed Clinical Social Worker

## 2020-07-10 DIAGNOSIS — F411 Generalized anxiety disorder: Secondary | ICD-10-CM

## 2020-07-10 DIAGNOSIS — F319 Bipolar disorder, unspecified: Secondary | ICD-10-CM

## 2020-07-10 NOTE — Progress Notes (Signed)
Virtual Video Visit Note  I connected with Alex [name] on 07/10/2020 at 1:00pm EST by video-enabled virtual visit. I verified that I am speaking with the correct person using two identifiers.I discussed the limitations of evaluation and management by telemedicine and the availability of in person appointments. The patient expressed understanding and agreed to proceed.   LOCATION: Patient:Home Provider: Home office  History of Present Illness:  Pt was referred by Dr. Adele Schilder for OP therapy for bipolar disorder and anxiety.    Observations/Objective: Pt presents  depressed for video enabled virtual therapy session. Pt discussed his psychiatric symptoms and  current life events. Patient reports he continues to have depressive symptoms. Pt reports on his family thanksgiving holiday, discussing family functioning. Pt discussed his "new found son" that has recently come back in his life." He was conceived when I was 79." At that point I was not involved in the child's life, at the insistence of her family." "However, he is currently in prison and has started contacting me." Clinician utilized MI OARS to reflect and summarize his thoughts and feelings. Explored with patient with listing pros and cons of beginning a relationship, listing the effects on his current family, effects on his wife, effects on patient. Validated his decisions.       Follow Up Instructions:  I discussed the assessment and treatment plan with the patient. The patient was provided an opportunity to ask questions and all were answered. The patient agreed with the plan and demonstrated an understanding of the instructions.   The patient was advised to call back or seek an in-person evaluation if the symptoms worsen or if the condition fails to improve as anticipated.  I provided 60 minutes of non-face-to-face time during this encounter.   Dajha Urquilla S, LCAS

## 2020-07-11 ENCOUNTER — Other Ambulatory Visit: Payer: Self-pay | Admitting: *Deleted

## 2020-07-11 ENCOUNTER — Encounter: Payer: Self-pay | Admitting: *Deleted

## 2020-07-11 DIAGNOSIS — I712 Thoracic aortic aneurysm, without rupture, unspecified: Secondary | ICD-10-CM

## 2020-07-17 ENCOUNTER — Other Ambulatory Visit: Payer: Self-pay

## 2020-07-17 ENCOUNTER — Ambulatory Visit (INDEPENDENT_AMBULATORY_CARE_PROVIDER_SITE_OTHER): Payer: No Typology Code available for payment source | Admitting: Licensed Clinical Social Worker

## 2020-07-17 ENCOUNTER — Encounter (HOSPITAL_COMMUNITY): Payer: Self-pay | Admitting: Licensed Clinical Social Worker

## 2020-07-17 DIAGNOSIS — F319 Bipolar disorder, unspecified: Secondary | ICD-10-CM | POA: Diagnosis not present

## 2020-07-17 NOTE — Progress Notes (Signed)
Virtual Video Visit Note  I connected with Alex [name] on  07/17/2020 at 1:00pm EST by video-enabled virtual visit. I verified that I am speaking with the correct person using two identifiers.I discussed the limitations of evaluation and management by telemedicine and the availability of in person appointments. The patient expressed understanding and agreed to proceed.   LOCATION: Patient:Home Provider: Home office  History of Present Illness:  Pt was referred by Dr. Adele Schilder for OP therapy for bipolar disorder and anxiety.    Observations/Objective: Pt presents  depressed for video enabled virtual therapy session. Pt discussed his psychiatric symptoms and  current life events. Patient reports he continues to have depressive symptoms. Pt reports on his family stressors and effective coping skills. Pt reports on individual family stressors.Clinician utilized MI OARS to reflect and summarize thoughts and feelings. Pt discussed his "new found son" who is in prison. Pt completed assignment, 'pros and cons of relationship with this son. [name] reviewed the list with patient. Clinician explored family updates, assessing family dynamics.          Follow Up Instructions:  I discussed the assessment and treatment plan with the patient. The patient was provided an opportunity to ask questions and all were answered. The patient agreed with the plan and demonstrated an understanding of the instructions.   The patient was advised to call back or seek an in-person evaluation if the symptoms worsen or if the condition fails to improve as anticipated.  I provided 60 minutes of non-face-to-face time during this encounter.   Laprecious Austill S, LCAS

## 2020-07-20 ENCOUNTER — Other Ambulatory Visit: Payer: Self-pay | Admitting: Internal Medicine

## 2020-07-24 ENCOUNTER — Other Ambulatory Visit: Payer: Self-pay

## 2020-07-24 ENCOUNTER — Encounter (HOSPITAL_COMMUNITY): Payer: Self-pay | Admitting: Licensed Clinical Social Worker

## 2020-07-24 ENCOUNTER — Ambulatory Visit (INDEPENDENT_AMBULATORY_CARE_PROVIDER_SITE_OTHER): Payer: No Typology Code available for payment source | Admitting: Licensed Clinical Social Worker

## 2020-07-24 DIAGNOSIS — F319 Bipolar disorder, unspecified: Secondary | ICD-10-CM | POA: Diagnosis not present

## 2020-07-24 DIAGNOSIS — F411 Generalized anxiety disorder: Secondary | ICD-10-CM | POA: Diagnosis not present

## 2020-07-24 NOTE — Progress Notes (Signed)
Virtual Video Phone Note  I connected with Ruperto Kiernan Pickford on  07/24/2020 at 1:00pm EST by phone-enabled virtual visit. I verified that I am speaking with the correct person using two identifiers.I discussed the limitations of evaluation and management by telemedicine and the availability of in person appointments. The patient expressed understanding and agreed to proceed.   LOCATION: Patient:Home Provider: Home office  History of Present Illness:  Pt was referred by Dr. Adele Schilder for OP therapy for bipolar disorder and anxiety.    Observations/Objective: Pt presents  depressed for video enabled virtual therapy session. Pt discussed his psychiatric symptoms and  current life events. Patient reports he continues to have depressive symptoms. Pt reports on his family stressors and effective coping skills. Pt reports on individual family stressors. Clinician utilized MI OARS to reflect and summarize his thoughts and feelings on his family stress. Clinician explored family updates, assessing family dynamics. Pt discussed his upcoming family Christmas holidays with a house full of family. Pt discussed his fear of drama. Pt used CBT to assist patient with acknowledging what he can control and what he cannot.         Follow Up Instructions:  I discussed the assessment and treatment plan with the patient. The patient was provided an opportunity to ask questions and all were answered. The patient agreed with the plan and demonstrated an understanding of the instructions.   The patient was advised to call back or seek an in-person evaluation if the symptoms worsen or if the condition fails to improve as anticipated.  I provided 60 minutes of non-face-to-face time during this encounter.   Akeela Busk S, LCAS

## 2020-07-26 ENCOUNTER — Telehealth: Payer: Self-pay | Admitting: Dermatology

## 2020-07-26 NOTE — Telephone Encounter (Signed)
Phone call to patient to get more information on what medication he wanted. He wants tolak cream to treat crown of scalp. Told patient well will confirm with Dr.Tafeen and send him a mychart message once we send in prescription.

## 2020-07-26 NOTE — Telephone Encounter (Signed)
Walked in. Wants med --thinks it was rx'd in 2019, but he never filled it --(doesn't know name) for topical used mostly on scalp (says it's not used in the summer and was through a mail order pharmacy.) ST patient Chart [phone]

## 2020-07-26 NOTE — Telephone Encounter (Signed)
We will we will gladly provide this prescription for him; explained that it is likely more cost effective to do it cash pay mail order.  Goal will be 28 applications; if too irritating to use nightly take a break and continue every other night.  Any problems during therapy please call us

## 2020-07-27 MED ORDER — TOLAK 4 % EX CREA: 1.0000 "application " | TOPICAL_CREAM | Freq: Every day | CUTANEOUS | 0 refills | Status: DC

## 2020-07-31 ENCOUNTER — Encounter (HOSPITAL_COMMUNITY): Payer: Self-pay | Admitting: Licensed Clinical Social Worker

## 2020-07-31 ENCOUNTER — Ambulatory Visit (INDEPENDENT_AMBULATORY_CARE_PROVIDER_SITE_OTHER): Payer: No Typology Code available for payment source | Admitting: Licensed Clinical Social Worker

## 2020-07-31 ENCOUNTER — Other Ambulatory Visit: Payer: Self-pay

## 2020-07-31 DIAGNOSIS — F319 Bipolar disorder, unspecified: Secondary | ICD-10-CM

## 2020-07-31 NOTE — Progress Notes (Signed)
Virtual Video Phone Note  I connected with Alex [name] on  07/31/2020 at 1:00pm EST by phone-enabled virtual visit. I verified that I am speaking with the correct person using  two identifiers.I discussed the limitations of evaluation and management by telemedicine and the availability of in person appointments. The patient expressed understanding and agreed to proceed.   LOCATION: Patient:Home Provider: Home office  History of Present Illness:  Pt was referred by Dr. Adele Schilder for OP therapy for bipolar disorder and anxiety.    Observations/Objective: Pt presents  depressed for video enabled virtual therapy session. Pt discussed his psychiatric symptoms and  current life events. Patient reports he continues to have depressive symptoms. Pt reports on his family stressors and effective coping skills. [name] assisted patient with skills for managing his stress, solving daily problems and resolving conflicts effectively.  Discussed future appointments for January.  Follow Up Instructions:  I discussed the assessment and treatment plan with the patient. The patient was provided an opportunity to ask questions and all were answered. The patient agreed with the plan and demonstrated an understanding of the instructions.   The patient was advised to call back or seek an in-person evaluation if the symptoms worsen or if the condition fails to improve as anticipated.  I provided 60 minutes of non-face-to-face time during this encounter.   Ozell Ferrera S, LCAS

## 2020-08-14 ENCOUNTER — Encounter (HOSPITAL_COMMUNITY): Payer: Self-pay | Admitting: Licensed Clinical Social Worker

## 2020-08-14 ENCOUNTER — Ambulatory Visit (INDEPENDENT_AMBULATORY_CARE_PROVIDER_SITE_OTHER): Payer: No Typology Code available for payment source | Admitting: Licensed Clinical Social Worker

## 2020-08-14 ENCOUNTER — Other Ambulatory Visit: Payer: Self-pay

## 2020-08-14 DIAGNOSIS — F319 Bipolar disorder, unspecified: Secondary | ICD-10-CM | POA: Diagnosis not present

## 2020-08-14 NOTE — Progress Notes (Signed)
Virtual Video Phone Note  I connected with Alex [name] on  08/14/2020 at 1:00pm EST by phone-enabled virtual visit. I verified that I am speaking with the correct person using  two identifiers.I discussed the limitations of evaluation and management by telemedicine and the availability of in person appointments. The patient expressed understanding and agreed to proceed.   LOCATION: Patient:Home Provider: Home office  History of Present Illness:  Pt was referred by Dr. Lolly Mustache for OP therapy for bipolar disorder and anxiety.    Observations/Objective: Pt presents  WNL for video enabled virtual therapy session. Pt discussed his psychiatric symptoms and  current life events. Patient reports on his holiday celebrations with family. "It went well, everyone got along." Patient reports on his personal goals for the new year. [name] provided psychoeducation on SMART goals.     Follow Up Instructions:  I discussed the assessment and treatment plan with the patient. The patient was provided an opportunity to ask questions and all were answered. The patient agreed with the plan and demonstrated an understanding of the instructions.   The patient was advised to call back or seek an in-person evaluation if the symptoms worsen or if the condition fails to improve as anticipated.  I provided 60 minutes of non-face-to-face time during this encounter.   Providencia Hottenstein S, LCAS

## 2020-08-15 ENCOUNTER — Other Ambulatory Visit: Payer: Self-pay | Admitting: Internal Medicine

## 2020-08-15 ENCOUNTER — Other Ambulatory Visit: Payer: Self-pay | Admitting: Cardiology

## 2020-08-21 ENCOUNTER — Ambulatory Visit (HOSPITAL_COMMUNITY): Payer: No Typology Code available for payment source | Admitting: Licensed Clinical Social Worker

## 2020-08-24 ENCOUNTER — Other Ambulatory Visit: Payer: Self-pay

## 2020-08-24 ENCOUNTER — Ambulatory Visit (INDEPENDENT_AMBULATORY_CARE_PROVIDER_SITE_OTHER): Payer: No Typology Code available for payment source | Admitting: Licensed Clinical Social Worker

## 2020-08-24 ENCOUNTER — Telehealth (HOSPITAL_COMMUNITY): Payer: Self-pay | Admitting: *Deleted

## 2020-08-24 ENCOUNTER — Encounter (HOSPITAL_COMMUNITY): Payer: Self-pay | Admitting: Licensed Clinical Social Worker

## 2020-08-24 DIAGNOSIS — F319 Bipolar disorder, unspecified: Secondary | ICD-10-CM | POA: Diagnosis not present

## 2020-08-24 DIAGNOSIS — F411 Generalized anxiety disorder: Secondary | ICD-10-CM

## 2020-08-24 MED ORDER — ZOLPIDEM TARTRATE 10 MG PO TABS: ORAL_TABLET | ORAL | 0 refills | Status: DC

## 2020-08-24 NOTE — Progress Notes (Signed)
Virtual Video Phone Note  I connected with Alex [name] on  08/24/2020 at 4:00pm EST by phone-enabled virtual visit. I verified that I am speaking with the correct person using  two identifiers.I discussed the limitations of evaluation and management by telemedicine and the availability of in person appointments. The patient expressed understanding and agreed to proceed.   LOCATION: Patient:Home Provider: Home office  History of Present Illness:  Pt was referred by Dr. Adele Schilder for OP therapy for bipolar disorder and anxiety.    Observations/Objective: Pt presents  WNL for video enabled virtual therapy session. Pt discussed his psychiatric symptoms and  current life events. Patient discusses his ongoing stressors: family. [name] discussed alternatives with patient. [name] provided psychoeducation on a new coping skill: soothing with 5 senses. Practiced in session. Patient is going to therapy with his adult son (stressor) tomorrow, requested by son. Role played with patient possible scenarios, which may occur during session. Validated patient's feelings about being summoned to his son's therapy appointment.           Follow Up Instructions:  I discussed the assessment and treatment plan with the patient. The patient was provided an opportunity to ask questions and all were answered. The patient agreed with the plan and demonstrated an understanding of the instructions.   The patient was advised to call back or seek an in-person evaluation if the symptoms worsen or if the condition fails to improve as anticipated.  I provided 60 minutes of non-face-to-face time during this encounter.   Ulyssa Walthour S, LCAS

## 2020-08-24 NOTE — Telephone Encounter (Signed)
Pt called requesting refill of the Ambien 10 mg last scripted on 06/05/20 with 1 refill. Pt next appointment is on 09/05/20. Please review.

## 2020-08-24 NOTE — Telephone Encounter (Signed)
Send to [facility] 

## 2020-08-24 NOTE — Telephone Encounter (Signed)
Thanks

## 2020-08-28 ENCOUNTER — Ambulatory Visit (HOSPITAL_COMMUNITY): Payer: No Typology Code available for payment source | Admitting: Licensed Clinical Social Worker

## 2020-09-01 ENCOUNTER — Other Ambulatory Visit: Payer: No Typology Code available for payment source

## 2020-09-04 ENCOUNTER — Ambulatory Visit (HOSPITAL_COMMUNITY): Payer: No Typology Code available for payment source | Admitting: Licensed Clinical Social Worker

## 2020-09-04 ENCOUNTER — Ambulatory Visit: Payer: No Typology Code available for payment source | Admitting: Cardiology

## 2020-09-05 ENCOUNTER — Encounter (HOSPITAL_COMMUNITY): Payer: Self-pay | Admitting: Psychiatry

## 2020-09-05 ENCOUNTER — Other Ambulatory Visit: Payer: Self-pay

## 2020-09-05 ENCOUNTER — Telehealth (INDEPENDENT_AMBULATORY_CARE_PROVIDER_SITE_OTHER): Payer: No Typology Code available for payment source | Admitting: Psychiatry

## 2020-09-05 DIAGNOSIS — F411 Generalized anxiety disorder: Secondary | ICD-10-CM | POA: Diagnosis not present

## 2020-09-05 DIAGNOSIS — F319 Bipolar disorder, unspecified: Secondary | ICD-10-CM

## 2020-09-05 MED ORDER — ZOLPIDEM TARTRATE 10 MG PO TABS
ORAL_TABLET | ORAL | 0 refills | Status: DC
Start: 2020-09-05 — End: 2020-10-24

## 2020-09-05 MED ORDER — LORAZEPAM 0.5 MG PO TABS: 0.5000 mg | ORAL_TABLET | Freq: Two times a day (BID) | ORAL | 2 refills | Status: DC

## 2020-09-05 MED ORDER — LAMOTRIGINE ER 300 MG PO TB24: 1.0000 | ORAL_TABLET | ORAL | 0 refills | Status: DC
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
## 2020-09-07 ENCOUNTER — Other Ambulatory Visit: Payer: Self-pay

## 2020-09-07 ENCOUNTER — Ambulatory Visit: Payer: No Typology Code available for payment source | Admitting: Dermatology

## 2020-09-07 ENCOUNTER — Encounter: Payer: Self-pay | Admitting: Dermatology

## 2020-09-07 DIAGNOSIS — D1801 Hemangioma of skin and subcutaneous tissue: Secondary | ICD-10-CM | POA: Diagnosis not present

## 2020-09-07 DIAGNOSIS — Z1283 Encounter for screening for malignant neoplasm of skin: Secondary | ICD-10-CM | POA: Diagnosis not present

## 2020-09-07 DIAGNOSIS — L821 Other seborrheic keratosis: Secondary | ICD-10-CM | POA: Diagnosis not present

## 2020-09-07 DIAGNOSIS — L570 Actinic keratosis: Secondary | ICD-10-CM | POA: Diagnosis not present

## 2020-09-11 ENCOUNTER — Encounter (HOSPITAL_COMMUNITY): Payer: Self-pay | Admitting: Licensed Clinical Social Worker

## 2020-09-11 ENCOUNTER — Ambulatory Visit (INDEPENDENT_AMBULATORY_CARE_PROVIDER_SITE_OTHER): Payer: No Typology Code available for payment source | Admitting: Licensed Clinical Social Worker

## 2020-09-11 ENCOUNTER — Other Ambulatory Visit: Payer: Self-pay

## 2020-09-11 DIAGNOSIS — F319 Bipolar disorder, unspecified: Secondary | ICD-10-CM
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
## 2020-09-13 ENCOUNTER — Encounter: Payer: Self-pay | Admitting: Dermatology

## 2020-09-13 ENCOUNTER — Ambulatory Visit
Admission: RE | Admit: 2020-09-13 | Discharge: 2020-09-13 | Disposition: A | Discharge status: Home / Self Care | Payer: No Typology Code available for payment source | Source: Ambulatory Visit | Attending: Cardiology | Admitting: Cardiology

## 2020-09-13 ENCOUNTER — Other Ambulatory Visit: Payer: Self-pay

## 2020-09-13 DIAGNOSIS — I712 Thoracic aortic aneurysm, without rupture, unspecified: Secondary | ICD-10-CM

## 2020-09-13 MED ORDER — IOPAMIDOL (ISOVUE-370) INJECTION 76%
75.0000 mL | Freq: Once | INTRAVENOUS | Status: AC | PRN
  Administered 2020-09-13: 75 mL via INTRAVENOUS

## 2020-09-13 NOTE — Progress Notes (Signed)
   Follow-Up Visit   Subjective  Alex [name] is a 65 y.o. male who presents for the following: Annual Exam (Left cheek- x 5 weeks- "sore", Left forehead- scaly spots & back).  General skin check Location: Some sore crusts on sun exposed skin Duration:  Quality:  Associated Signs/Symptoms: Modifying Factors:  Severity:  Timing: Context:   Objective  Well appearing patient in no apparent distress; mood and affect are within normal limits. Objective  Chest - Medial [facility]): 6 mm textured brown flattopped papule  Objective  Left Abdomen (side) - Upper: Smooth 2 mm red papule  Objective  Chest - Medial [facility]): Waist up skin examination, no atypical moles, melanoma, nonmobile skin cancer.  Objective  Left Forehead, Left Malar Cheek, Mid Parietal Scalp (4), Neck - Posterior, Right Buccal Cheek, Right Temporal Scalp: Multiple 2 to 4 mm pink hornlike crusts    All skin waist up examined.   Assessment & Plan    Seborrheic keratosis Chest - Medial (Center)  Leave if stable  Cherry angioma Left Abdomen (side) - Upper  Leave if stable  Screening exam for skin cancer Chest - Medial [facility])  Annual skin examination, encouraged to self examine his skin twice annually, continued ultraviolet protection.  AK (actinic keratosis) (9) Neck - Posterior; Mid Parietal Scalp (4); Left Malar Cheek; Right Buccal Cheek; Left Forehead; Right Temporal Scalp  Destruction of lesion - Left Forehead, Left Malar Cheek, Mid Parietal Scalp, Neck - Posterior, Right Buccal Cheek, Right Temporal Scalp Complexity: simple   Destruction method: cryotherapy   Informed consent: discussed and consent obtained   Timeout:  patient name, date of birth, surgical site, and procedure verified Lesion destroyed using liquid nitrogen: Yes   Cryotherapy cycles:  3 Outcome: patient tolerated procedure well with no complications   Post-procedure details: wound care instructions given        I,  [name], MD, have reviewed all documentation for this visit.  The documentation on 09/13/20 for the exam, diagnosis, procedures, and orders are all accurate and complete.

## 2020-09-18 ENCOUNTER — Ambulatory Visit (HOSPITAL_COMMUNITY): Payer: No Typology Code available for payment source | Admitting: Licensed Clinical Social Worker

## 2020-09-25 ENCOUNTER — Ambulatory Visit (INDEPENDENT_AMBULATORY_CARE_PROVIDER_SITE_OTHER): Payer: No Typology Code available for payment source | Admitting: Licensed Clinical Social Worker

## 2020-09-25 ENCOUNTER — Other Ambulatory Visit: Payer: Self-pay

## 2020-09-25 DIAGNOSIS — F319 Bipolar disorder, unspecified: Secondary | ICD-10-CM

## 2020-09-26 ENCOUNTER — Ambulatory Visit: Payer: No Typology Code available for payment source | Admitting: Dermatology

## 2020-09-26 ENCOUNTER — Encounter: Payer: Self-pay | Admitting: *Deleted

## 2020-09-28 ENCOUNTER — Encounter (HOSPITAL_COMMUNITY): Payer: Self-pay | Admitting: Licensed Clinical Social Worker

## 2020-10-02 ENCOUNTER — Encounter (HOSPITAL_COMMUNITY): Payer: Self-pay | Admitting: Licensed Clinical Social Worker

## 2020-10-02 ENCOUNTER — Other Ambulatory Visit: Payer: Self-pay

## 2020-10-02 ENCOUNTER — Ambulatory Visit (HOSPITAL_COMMUNITY): Payer: No Typology Code available for payment source | Admitting: Licensed Clinical Social Worker

## 2020-10-02 ENCOUNTER — Ambulatory Visit (INDEPENDENT_AMBULATORY_CARE_PROVIDER_SITE_OTHER): Payer: No Typology Code available for payment source | Admitting: Licensed Clinical Social Worker

## 2020-10-02 DIAGNOSIS — F319 Bipolar disorder, unspecified: Secondary | ICD-10-CM

## 2020-10-02 NOTE — Progress Notes (Addendum)
Virtual Video Video Note  I connected with Alex [name] on 10/02/2020 at 1:00 pm EST by video-enabled virtual visit. I verified that I am speaking with the correct person using  two identifiers.I discussed the limitations of evaluation and management by telemedicine and the availability of in person appointments. The patient expressed understanding and agreed to proceed.   LOCATION: Patient:Home Provider: Home office  History of Present Illness:  Pt was referred by Dr. Adele Schilder for OP therapy for bipolar disorder and anxiety.    Observations/Objective: Pt presents depressed for his video enabled virtual therapy session. Pt discussed his psychiatric symptoms and  current life events. Pt reports  his moods have been mixed for the past  week. Pt reports on his coping skills he's been using almost every day for mood fluctuation. [name] reviewed tx plan with pt who verbalized acceptance of the plan. Patient reports on his biggest stressor: family drama. Patient goes in-depth on how the family stress is affecting the family unit and patient's moods and negative thinking. Clinician utilized CBT to address thought processes. Clinician provided thought stopping tools, as well as reality testing to provide support and confidence in his decisions.   Follow Up Instructions:  I discussed the assessment and treatment plan with the patient. The patient was provided an opportunity to ask questions and all were answered. The patient agreed with the plan and demonstrated an understanding of the instructions.   The patient was advised to call back or seek an in-person evaluation if the symptoms worsen or if the condition fails to improve as anticipated.  I provided 60 minutes of non-face-to-face time during this encounter.   Kiyah Demartini S, LCAS

## 2020-10-02 NOTE — Progress Notes (Signed)
Virtual Video Video Note  I connected with Alex [name] on 09/25/2020 at 1:00 pm EST by video-enabled virtual visit. I verified that I am speaking with the correct person using  two identifiers.I discussed the limitations of evaluation and management by telemedicine and the availability of in person appointments. The patient expressed understanding and agreed to proceed.   LOCATION: Patient:Home Provider: Home office  History of Present Illness:  Pt was referred by Dr. Adele Schilder for OP therapy for bipolar disorder and anxiety.    Observations/Objective: Pt presents anxious for video enabled virtual therapy session. Pt discussed his psychiatric symptoms and  current life events. Pt reports  his moods have been mixed for the past 2 weeks. Pt reports on his coping skills he's been using almost every day for mood fluctuation. Patient reports on his biggest stressor: family drama, the 2 adult children who still live in his house. [name] used mindfulness-based stress reduction to assist patient with his anxious thoughts. [name] provided suggestions/alternatives for ongoing family stressors. Pt reports he will talk with his wife about suggestions discussed in session. [name] used CBT to assist patient in increasing his low frustration tolerance that he experiences while engaging with family members.                Follow Up Instructions:  I discussed the assessment and treatment plan with the patient. The patient was provided an opportunity to ask questions and all were answered. The patient agreed with the plan and demonstrated an understanding of the instructions.   The patient was advised to call back or seek an in-person evaluation if the symptoms worsen or if the condition fails to improve as anticipated.  I provided 60 minutes of non-face-to-face time during this encounter.   Yojan Paskett S, LCAS

## 2020-10-09 ENCOUNTER — Ambulatory Visit (HOSPITAL_COMMUNITY): Payer: No Typology Code available for payment source | Admitting: Licensed Clinical Social Worker

## 2020-10-11 ENCOUNTER — Ambulatory Visit (HOSPITAL_COMMUNITY): Payer: No Typology Code available for payment source | Admitting: Licensed Clinical Social Worker

## 2020-10-12 ENCOUNTER — Other Ambulatory Visit: Payer: Self-pay

## 2020-10-12 ENCOUNTER — Ambulatory Visit (INDEPENDENT_AMBULATORY_CARE_PROVIDER_SITE_OTHER): Payer: No Typology Code available for payment source | Admitting: Licensed Clinical Social Worker

## 2020-10-12 DIAGNOSIS — F319 Bipolar disorder, unspecified: Secondary | ICD-10-CM

## 2020-10-16 ENCOUNTER — Ambulatory Visit (HOSPITAL_COMMUNITY): Payer: No Typology Code available for payment source | Admitting: Licensed Clinical Social Worker

## 2020-10-16 ENCOUNTER — Encounter (HOSPITAL_COMMUNITY): Payer: Self-pay | Admitting: Licensed Clinical Social Worker

## 2020-10-16 NOTE — Progress Notes (Signed)
Virtual Video Video Note  I connected with Alex [name] on 10/12/2020 at 1:00 pm EST by video-enabled virtual visit. I verified that I am speaking with the correct person using  two identifiers.I discussed the limitations of evaluation and management by telemedicine and the availability of in person appointments. The patient expressed understanding and agreed to proceed.   LOCATION: Patient:Home Provider: Home office  History of Present Illness:  Pt was referred by Dr. Adele Schilder for OP therapy for bipolar disorder and anxiety.    Observations/Objective: Pt presents depressed for his video enabled virtual therapy session. Pt discussed his psychiatric symptoms and  current life events. Pt reports  his moods have been mixed for the past  week. Pt reports on his coping skills he's been using almost every day for mood fluctuation. Patient and his wife were on a brief vacation but all of his children felt they could call and interrupt them. [name] provided psychoeducation on boundaries. [name] also provided psychoeducation on co-dependency. [name] recommended patient buy the book: Co-Dependent No More. Reviewed the book with patient.      Follow Up Instructions:  I discussed the assessment and treatment plan with the patient. The patient was provided an opportunity to ask questions and all were answered. The patient agreed with the plan and demonstrated an understanding of the instructions.   The patient was advised to call back or seek an in-person evaluation if the symptoms worsen or if the condition fails to improve as anticipated.  I provided 60 minutes of non-face-to-face time during this encounter.   Peightyn Roberson S, LCAS

## 2020-10-20 ENCOUNTER — Other Ambulatory Visit (HOSPITAL_COMMUNITY): Payer: Self-pay | Admitting: Psychiatry

## 2020-10-20 DIAGNOSIS — F411 Generalized anxiety disorder: Secondary | ICD-10-CM

## 2020-10-23 ENCOUNTER — Encounter (HOSPITAL_COMMUNITY): Payer: Self-pay | Admitting: Licensed Clinical Social Worker

## 2020-10-23 ENCOUNTER — Other Ambulatory Visit: Payer: Self-pay

## 2020-10-23 ENCOUNTER — Ambulatory Visit (INDEPENDENT_AMBULATORY_CARE_PROVIDER_SITE_OTHER): Payer: No Typology Code available for payment source | Admitting: Licensed Clinical Social Worker

## 2020-10-23 DIAGNOSIS — F319 Bipolar disorder, unspecified: Secondary | ICD-10-CM

## 2020-10-23 NOTE — Progress Notes (Signed)
Virtual Video Video Note  I connected with Alex [name] on 10/23/2020 at 1:00 pm EST by video-enabled virtual visit. I verified that I am speaking with the correct person using  two identifiers.I discussed the limitations of evaluation and management by telemedicine and the availability of in person appointments. The patient expressed understanding and agreed to proceed.   LOCATION: Patient:Home Provider: Home office  History of Present Illness:  Pt was referred by Dr. Adele Schilder for OP therapy for bipolar disorder and anxiety.    Observations/Objective: Pt presents depressed for his video enabled virtual therapy session. Pt discussed his psychiatric symptoms and  current life events. Pt reports  his moods have been depressed for the past  week due to continued dysfunctional family relationships. Client relayed that he notices good moments and 'really bad ones that flare my anxiety' and identified conflict with family as a significant trigger. [name] used  mindfulness based CBT. Pt bought the book cln suggested. "I've read 25%."Again,  [name] provided psychoeducation on codependency. [name] suggested pt practice coping skills between sessions and continue reading the book.       Follow Up Instructions:  I discussed the assessment and treatment plan with the patient. The patient was provided an opportunity to ask questions and all were answered. The patient agreed with the plan and demonstrated an understanding of the instructions.   The patient was advised to call back or seek an in-person evaluation if the symptoms worsen or if the condition fails to improve as anticipated.  I provided 60 minutes of non-face-to-face time during this encounter.   Xzayvier Fagin S, LCAS

## 2020-10-26 ENCOUNTER — Other Ambulatory Visit: Payer: Self-pay | Admitting: Surgery

## 2020-10-26 DIAGNOSIS — I712 Thoracic aortic aneurysm, without rupture, unspecified: Secondary | ICD-10-CM

## 2020-10-30 ENCOUNTER — Ambulatory Visit (HOSPITAL_COMMUNITY): Payer: No Typology Code available for payment source | Admitting: Licensed Clinical Social Worker

## 2020-10-31 LAB — LIPID PANEL
Chol/HDL Ratio: 2.1 ratio (ref 0.0–5.0)
Cholesterol, Total: 180 mg/dL (ref 100–199)
HDL: 84 mg/dL (ref >39)
LDL Chol Calc (NIH): 79 mg/dL (ref 0–99)
Triglycerides: 98 mg/dL (ref 0–149)
VLDL Cholesterol Cal: 17 mg/dL (ref 5–40)

## 2020-10-31 LAB — HEPATIC FUNCTION PANEL
ALT: 40 IU/L (ref 0–44)
AST: 35 IU/L (ref 0–40)
Albumin: 4.5 g/dL (ref 3.8–4.8)
Alkaline Phosphatase: 82 IU/L (ref 44–121)
Bilirubin Total: 0.8 mg/dL (ref 0.0–1.2)
Bilirubin, Direct: 0.26 mg/dL (ref 0.00–0.40)
Total Protein: 6.6 g/dL (ref 6.0–8.5)

## 2020-11-06 ENCOUNTER — Ambulatory Visit (INDEPENDENT_AMBULATORY_CARE_PROVIDER_SITE_OTHER): Payer: No Typology Code available for payment source | Admitting: Licensed Clinical Social Worker

## 2020-11-06 ENCOUNTER — Encounter (HOSPITAL_COMMUNITY): Payer: Self-pay | Admitting: Licensed Clinical Social Worker

## 2020-11-06 ENCOUNTER — Other Ambulatory Visit: Payer: Self-pay

## 2020-11-06 DIAGNOSIS — F319 Bipolar disorder, unspecified: Secondary | ICD-10-CM

## 2020-11-06 NOTE — Progress Notes (Signed)
Virtual Phone Note  I connected with Alex [name] on 11/06/2020 at 1:00 pm EST by phone-enabled virtual visit. I verified that I am speaking with the correct person using  two identifiers.I discussed the limitations of evaluation and management by telemedicine and the availability of in person appointments. The patient expressed understanding and agreed to proceed. Patient was unable to connect via my chart.  LOCATION: Patient:Home Provider: Home office  History of Present Illness:  Pt was referred by Dr. Adele Schilder for OP therapy for bipolar disorder and anxiety.    Observations/Objective: Patient presented for today's session on time and was alert, oriented x5, with no evidence or self-report of SI/HI or A/V H.  Patient reported ongoing compliance with medication and denied any use of alcohol or illicit substances.  Clinician inquired about patient's current emotional ratings, as well as any significant changes in thoughts, feelings or behavior since previous session.  Patient reported scores of 2/10 for depression, 3/10 for anxiety, 0/10 for anger/irritability. Pt reports on continued health issues, which is reducing my quality of life." Clinician utilized CBT to process thoughts and feelings. Clinician noted the challenge in being productive and feeling positive when physical health is poor. Clinician provided supportive feedback. Pt still continues to identify his biggest stressor: adult children with mental health issues who live with him and cannot live on their own. [name] and pt explore changes patient can make to improve his quality of life. Pt continues to read "Codependent No More." [name] and pt explored his codependent traits with suggestions: "letting go." [name] completed the PHQ 2 & 9 and C-SSRS, GAD7. Will review questions of screenings at next session.         Follow Up Instructions:  I discussed the assessment and treatment plan with the patient. The patient was provided an opportunity  to ask questions and all were answered. The patient agreed with the plan and demonstrated an understanding of the instructions.   The patient was advised to call back or seek an in-person evaluation if the symptoms worsen or if the condition fails to improve as anticipated.  I provided 60 minutes of non-face-to-face time during this encounter.   Corianna Avallone S, LCAS

## 2020-11-17 ENCOUNTER — Other Ambulatory Visit: Payer: Self-pay | Admitting: Urology

## 2020-11-17 ENCOUNTER — Other Ambulatory Visit (HOSPITAL_COMMUNITY)
Admission: RE | Admit: 2020-11-17 | Discharge: 2020-11-17 | Disposition: A | Discharge status: Home / Self Care | Payer: No Typology Code available for payment source | Source: Ambulatory Visit | Attending: Urology | Admitting: Urology

## 2020-11-17 DIAGNOSIS — Z20822 Contact with and (suspected) exposure to covid-19: Secondary | ICD-10-CM | POA: Insufficient documentation

## 2020-11-17 DIAGNOSIS — N201 Calculus of ureter: Secondary | ICD-10-CM

## 2020-11-17 DIAGNOSIS — Z01812 Encounter for preprocedural laboratory examination: Secondary | ICD-10-CM | POA: Insufficient documentation

## 2020-11-17 LAB — SARS CORONAVIRUS 2 (TAT 6-24 HRS): SARS Coronavirus 2: NEGATIVE

## 2020-11-17 NOTE — Progress Notes (Signed)
Left voicemail for pt to call [facility]

## 2020-11-17 NOTE — Progress Notes (Signed)
Phone call completed. Meds OK. Pt aware he is to arrive at 1030. NPO after midnight.

## 2020-11-20 ENCOUNTER — Other Ambulatory Visit: Payer: Self-pay

## 2020-11-20 ENCOUNTER — Encounter (HOSPITAL_BASED_OUTPATIENT_CLINIC_OR_DEPARTMENT_OTHER): Payer: Self-pay | Admitting: Urology

## 2020-11-20 ENCOUNTER — Encounter (HOSPITAL_BASED_OUTPATIENT_CLINIC_OR_DEPARTMENT_OTHER)
Admission: RE | Disposition: A | Discharge status: Home / Self Care | Payer: Self-pay | Source: Home / Self Care | Attending: Urology

## 2020-11-20 ENCOUNTER — Ambulatory Visit (HOSPITAL_COMMUNITY): Payer: No Typology Code available for payment source | Admitting: Licensed Clinical Social Worker

## 2020-11-20 ENCOUNTER — Ambulatory Visit (HOSPITAL_BASED_OUTPATIENT_CLINIC_OR_DEPARTMENT_OTHER)
Admission: RE | Admit: 2020-11-20 | Discharge: 2020-11-20 | Disposition: A | Discharge status: Home / Self Care | Payer: No Typology Code available for payment source | Attending: Urology | Admitting: Urology

## 2020-11-20 ENCOUNTER — Ambulatory Visit (HOSPITAL_COMMUNITY): Payer: No Typology Code available for payment source

## 2020-11-20 DIAGNOSIS — Z85840 Personal history of malignant neoplasm of eye: Secondary | ICD-10-CM | POA: Diagnosis not present

## 2020-11-20 DIAGNOSIS — Z885 Allergy status to narcotic agent status: Secondary | ICD-10-CM | POA: Diagnosis not present

## 2020-11-20 DIAGNOSIS — Z85828 Personal history of other malignant neoplasm of skin: Secondary | ICD-10-CM | POA: Insufficient documentation

## 2020-11-20 DIAGNOSIS — I10 Essential (primary) hypertension: Secondary | ICD-10-CM | POA: Diagnosis not present

## 2020-11-20 DIAGNOSIS — N201 Calculus of ureter: Secondary | ICD-10-CM | POA: Diagnosis present

## 2020-11-20 DIAGNOSIS — Z85850 Personal history of malignant neoplasm of thyroid: Secondary | ICD-10-CM | POA: Diagnosis not present

## 2020-11-20 DIAGNOSIS — Z923 Personal history of irradiation: Secondary | ICD-10-CM | POA: Diagnosis not present

## 2020-11-20 HISTORY — PX: EXTRACORPOREAL SHOCK WAVE LITHOTRIPSY: SHX1557

## 2020-11-20 SURGERY — LITHOTRIPSY, ESWL
Anesthesia: LOCAL | Laterality: Right

## 2020-11-20 MED ORDER — SODIUM CHLORIDE 0.9 % IV SOLN: INTRAVENOUS | Status: DC

## 2020-11-20 MED ORDER — DIPHENHYDRAMINE HCL 25 MG PO CAPS
ORAL_CAPSULE | ORAL | Status: AC
  Filled 2020-11-20: qty 1

## 2020-11-20 MED ORDER — DIAZEPAM 5 MG PO TABS
10.0000 mg | ORAL_TABLET | ORAL | Status: AC
  Administered 2020-11-20: 10 mg via ORAL

## 2020-11-20 MED ORDER — CIPROFLOXACIN HCL 500 MG PO TABS
ORAL_TABLET | ORAL | Status: AC
  Filled 2020-11-20: qty 1

## 2020-11-20 MED ORDER — DIPHENHYDRAMINE HCL 25 MG PO CAPS
25.0000 mg | ORAL_CAPSULE | ORAL | Status: AC
  Administered 2020-11-20: 25 mg via ORAL

## 2020-11-20 MED ORDER — PROMETHAZINE HCL 50 MG PO TABS: 50.0000 mg | ORAL_TABLET | Freq: Four times a day (QID) | ORAL | 0 refills | Status: DC | PRN

## 2020-11-20 MED ORDER — OXYCODONE-ACETAMINOPHEN 5-325 MG PO TABS: 1.0000 | ORAL_TABLET | Freq: Four times a day (QID) | ORAL | 0 refills | Status: DC | PRN

## 2020-11-20 MED ORDER — CIPROFLOXACIN HCL 500 MG PO TABS
500.0000 mg | ORAL_TABLET | ORAL | Status: AC
  Administered 2020-11-20: 500 mg via ORAL

## 2020-11-20 MED ORDER — DIAZEPAM 5 MG PO TABS
ORAL_TABLET | ORAL | Status: AC
  Filled 2020-11-20: qty 2

## 2020-11-20 NOTE — Discharge Instructions (Signed)
Lithotripsy, Care After This sheet gives you information about how to care for yourself after your procedure. Your health care provider may also give you more specific instructions. If you have problems or questions, contact your health care provider. What can I expect after the procedure? After the procedure, it is common to have:  Some blood in your urine. This should only last for a few days.  Soreness in your back, sides, or upper abdomen for a few days.  Blotches or bruises on the area where the shock wave entered the skin.  Pain, discomfort, or nausea when pieces (fragments) of the kidney stone move through the tube that carries urine from the kidney to the bladder (ureter). Stone fragments may pass soon after the procedure, but they may continue to pass for up to 4-8 weeks. ? If you have severe pain or nausea, contact your health care provider. This may be caused by a large stone that was not broken up, and this may mean that you need more treatment.  Some pain or discomfort during urination.  Some pain or discomfort in the lower abdomen or (in men) at the base of the penis. Follow these instructions at home: Medicines  Take over-the-counter and prescription medicines only as told by your health care provider.  If you were prescribed an antibiotic medicine, take it as told by your health care provider. Do not stop taking the antibiotic even if you start to feel better.  Ask your health care provider if the medicine prescribed to you requires you to avoid driving or using machinery. Eating and drinking  Drink enough fluid to keep your urine pale yellow. This helps any remaining pieces of the stone to pass. It can also help prevent new stones from forming.  Eat plenty of fresh fruits and vegetables.  Follow instructions from your health care provider about eating or drinking restrictions. You may be instructed to: ? Reduce how much salt (sodium) you eat or drink. Check  ingredients and nutrition facts on packaged foods and beverages to see how much sodium they contain. ? Reduce how much meat you eat.  Eat the recommended amount of calcium for your age and gender. Ask your health care provider how much calcium you should have.      General instructions  Get plenty of rest.  Return to your normal activities as told by your health care provider. Ask your health care provider what activities are safe for you. Most people can resume normal activities 1-2 days after the procedure.  If you were given a sedative during the procedure, it can affect you for several hours. Do not drive or operate machinery until your health care provider says that it is safe.  Your health care provider may direct you to lie in a certain position (postural drainage) and tap firmly (percuss) over your kidney area to help stone fragments pass. Follow instructions as told by your health care provider.  If directed, strain all urine through the strainer that was provided by your health care provider. ? Keep all fragments for your health care provider to see. Any stones that are found may be sent to a medical lab for examination. The stone may be as small as a grain of salt.  Keep all follow-up visits as told by your health care provider. This is important. Contact a health care provider if:  You have a fever or chills.  You have nausea that is severe or does not go away.  You have  any of these urinary symptoms: ? Blood in your urine for longer than your health care provider told you to expect. ? Urine that smells bad or unusual. ? Feeling a strong urge to urinate after emptying your bladder. ? Pain or burning with urination that does not go away. ? Urinating more often than usual and this does not go away.  You have a stent and it comes out. Get help right away if:  You have severe pain in your back, sides, or upper abdomen.  You have any of these urinary symptoms: ? Severe  pain while urinating. ? More blood in your urine or having blood in your urine when you did not before. ? Passing blood clots in your urine. ? Passing only a small amount of urine or being unable to pass any urine at all.  You have severe nausea that leads to persistent vomiting.  You faint. Summary  After this procedure, it is common to have some pain, discomfort, or nausea when pieces (fragments) of the kidney stone move through the tube that carries urine from the kidney to the bladder (ureter). If this pain or nausea is severe, however, you should contact your health care provider.  Return to your normal activities as told by your health care provider. Ask your health care provider what activities are safe for you.  Drink enough fluid to keep your urine pale yellow. This helps any remaining pieces of the stone to pass, and it can help prevent new stones from forming.  If directed, strain your urine and keep all fragments for your health care provider to see. Fragments or stones may be as small as a grain of salt.  Get help right away if you have severe pain in your back, sides, or upper abdomen, or if you have severe pain while urinating. This information is not intended to replace advice given to you by your health care provider. Make sure you discuss any questions you have with your health care provider. Document Revised: 05/12/2019 Document Reviewed: 05/12/2019 Elsevier Patient Education  2021 [place] may have urinary urgency (bladder spasms), pass small stone fragments,  and bloody urine on / off or up to 2 weeks. This is normal.  2 - Call MD or go to ER for fever >102, severe pain / nausea / vomiting not relieved by medications, or acute change in medical status

## 2020-11-20 NOTE — Brief Op Note (Signed)
11/20/2020  11:12 AM  PATIENT:  Alex [name]  65 y.o. male  PRE-OPERATIVE DIAGNOSIS:  RIGHT URETERAL STONE  POST-OPERATIVE DIAGNOSIS:  * No post-op diagnosis entered *  PROCEDURE:  Procedure(s) with comments: EXTRACORPOREAL SHOCK WAVE LITHOTRIPSY (ESWL) (Right) - 75 MINS  SURGEON:  Surgeon(s) and Role:    * Alexis Frock, MD - Primary  PHYSICIAN ASSISTANT:   ASSISTANTS: none   ANESTHESIA:   MAC  EBL:  minimal   BLOOD ADMINISTERED:none  DRAINS: none   LOCAL MEDICATIONS USED:  NONE  SPECIMEN:  No Specimen  DISPOSITION OF SPECIMEN:  N/A  COUNTS:  YES  TOURNIQUET:  * No tourniquets in log *  DICTATION: .Note written in paper chart  PLAN OF CARE: Discharge to home after PACU  PATIENT DISPOSITION:  PACU - hemodynamically stable.   Delay start of Pharmacological VTE agent (>24hrs) due to surgical blood loss or risk of bleeding: yes

## 2020-11-20 NOTE — H&P (Signed)
Alex [name] is an 65 y.o. male.    Chief Complaint: Pre-Op RIGHT Shockwave Lithotripsy  HPI:   1 - RIGHT Ureteral Stones - 3mm and 75mm adjacent mid-distal stones by CT 11/2020 on eval pelvic pain. UTI screen negative. Stones are at mid-lower SI joint area.  Today "Alex [name]" is seen for RIGHT shockwave lithotripsy for mid-distal right ureteral stones. No interval fevers.  C19 screen negative. Did have low dose ASA Friday AM, stone well out of renal shadow.   Past Medical History:  Diagnosis Date  . Anemia   . Anxiety   . Atypical nevus 04/12/1997   dyplastic-left chest below nipple  . Atypical nevus 01/18/2005   slight-mod-mid upper abd, slight-mod-right lateral chest-(WS), slight-mod-mid lower back (punch)  . Atypical nevus 05/31/2005   dysplastic-central lowerback (exc), dysplastic- right abdomen (Exc)  . Basal cell carcinoma 06/04/2016   back of neck  . Bipolar I disorder ([place])   . Bleeding ulcer 2016  . BPH (benign prostatic hypertrophy) with urinary obstruction   . 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 Cataract    LEFT EYE  . Chronic back pain   . Complication of anesthesia POST URINARY RETENTION---  2006 SHOULDER SURGERY MARKED BRADYCARDIA VAGAL RESPONSE NO ISSUE W/ SURGERY AFTER THIS ONE   WITH GENERAL ANESTHESIA, 15 YRS AGO VASOVAGAL REACTION NONE SINCE  . 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 Coronary atherosclerosis CARDIOLOGIST- DR CRENSHAW--  LAST VISIT 01-05-2012 IN EPIC   NON-OBSTRUCTIVE MILD DISEASE  . 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 Depression   . Epicondylitis    right elbow  . GERD (gastroesophageal reflux disease)   . Glaucoma BOTH EYES   RIGHT EYE RADIATION DAMAGE  . Hearing loss    BOTH EARS  . Hearing loss    Bilateral  . Hepatic cyst    Several, The lesion of concern in segment 6 of the liver has single large portal vein and hepatic vein branches extending to tt, in a pattern of  enhancement which mirrors these vascular structures. 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 These can be seen in normal patients and also on patient's with portal venous hypertension and in this case the lesion   . History of chronic prostatitis   . History of deviated nasal septum   . History of hiatal hernia    SMALL  . History of kidney stones   . 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 History of thyroid cancer PRIMARY (NO METS FROM ORBITAL CANCER)--   IN REMISSION   S/P TOTAL THYROIDECTOMY  , CHEMORADIATION  (ONCOLOGIST -- DR Griffith Citron)  . Hyperlipidemia   . Hypertension   . Macular degeneration    Left  . Nocturia   . OA (osteoarthritis)   . Pancreas cyst   . Peripheral vascular disease ([facility])    THORACIC AA 3. 9 CM X 4. 3 CM PER NOV 06-14-17  CHEST CTFOLOWED BY DR CRENSHAW YEARLY FOR  . Positional vertigo    HX OF WITH SINUS INFECTIONS  . Radial head fracture    Right  . Squamous cell carcinoma of skin 06/04/2016   in situ-crown of scalp  .  Squamous cell carcinoma of skin 04/22/2017   in situ-crown scalp (txpbx)  . Thoracic aortic aneurysm ([place]) 06/14/2017   last CT 4.1 CM Mild  . Tinnitus    CONSTANT  . Ulnar nerve compression    right elbow  . Unsteady gait    especially with stairs, depth perception off  . Urinary hesitancy   . Wears glasses     Past Surgical History:  Procedure Laterality Date  . CARDIAC CATHETERIZATION  01-16-2006  DR Marcello Moores WALL   MILD CORONARY ATHEROSCLEROSIS/ MID TO DISTAL LAD 40% STENOSIS/ LVF 50-55%  . CARPAL TUNNEL RELEASE Right 11/03/2017   Procedure: RIGHT HAND CARPAL TUNNEL RELEASE;  Surgeon: Iran Planas, MD;  Location: [facility];  Service: Orthopedics;  Laterality: Right;  . CATARACT EXTRACTION Right   .  COLONSCOPY  2017 LAST DONE   MULTIPLE  . ENDOSCOPY  LAST 2017   MULTIPLE DONE DILATION DONE ALSO  . ESOPHAGOGASTRODUODENOSCOPY (EGD) WITH PROPOFOL N/A 02/24/2018   Procedure: ESOPHAGOGASTRODUODENOSCOPY (EGD) WITH PROPOFOL;  Surgeon: Laurence Spates, MD;  Location: [facility];  Service: Endoscopy;  Laterality: N/A;  . EXCISION RADIAL HEAD Right 11/03/2017   Procedure: RIGHT PROXIMAL RADIUS RADIAL HEAD RESECTION AND JOINT DEBRIDEMENT;  Surgeon: Iran Planas, MD;  Location: [place];  Service: Orthopedics;  Laterality: Right;  . KNEE ARTHROSCOPY  05/01/2012   Procedure: ARTHROSCOPY KNEE;  Surgeon: Johnn Hai, MD;  Location: [facility];  Service: Orthopedics;  Laterality: Left;  debridement and removal of loose body  . LEFT ANKLE ARTHROSCOPY W/ DEBRIDEMENT  05-12-2007  . LEFT HYDROCELECTOMY  03-29-2005   AND REPAIR LEFT INGUINAL HERNIA W/ MESH  . MOHS SURGERY  2002   RIGHT ORBITAL CANCER  . NASAL ENDOSCOPY  08-07-2005   RIGHT EPISTAXIS  / POST SEPTOPLASTY  (HX RIGHT ORBITAL CA & S/P RADIATION/ NECROSIS ANTERIOR END OF BOTH INFERIOR TURBINATES)  . occuloplastic surgery  2002  . PARS PLANA VITRECTOMY  11-06-2004   RIGHT EYE RADIATION RETINOPATHY W/ HEMORRHAGE  . RADIAL HEAD ARTHROPLASTY Right 06/15/2018   Procedure: RIGHT ELBOW PROXIMAL RADIOULNAR JOINT DEBRIDEMENT AND ARTHROPLASTY;  Surgeon: Iran Planas, MD;  Location: [place];  Service: Orthopedics;  Laterality: Right;  BLOCK WITH SEDATION  . REPAIR UNDESENDED RIGHT TESTICLE / RIGHT INGUINAL HERNIA  AGE 40  . RIGHT ANKLE ARTHROSCOPY W/ EXTENSIVE DEBRIDEMENT  04/05/2008   x2  . RIGHT SHOULDER SURGERY  2006  . RIGHT SUPRAOMOHYOID NECK DISSECTION   03-08-2003   ZONES 1,2,3;   SUBMANDIBULAR MASS / METASTATIC SQUAMOUS CELL CARCINOMA RIGHT NECK  . SAVORY DILATION N/A 02/24/2018   Procedure: SAVORY DILATION;  Surgeon: Laurence Spates, MD;  Location: [facility];  Service: Endoscopy;   Laterality: N/A;  . SEPTOPLASTY  NOV 2006  . SHOULDER ARTHROSCOPY Left   . SHOULDER ARTHROSCOPY W/ SUBACROMIAL DECOMPRESSION AND DISTAL CLAVICLE EXCISION  10-09-2008   AND DEBRIDEMENT OF RIGHT SHOULDER IMPINGEMENT & [facility] JOINT ARTHRITIS  . SPINE SURGERY  2016   l 3 TO l 4 PLATE AND SCREWS  . TOTAL THYROIDECTOMY  11-03-2001   PAPILLARY THYROID CARCINOMA  . TRANSTHORACIC ECHOCARDIOGRAM  12/ 2012   grade I diastolic dysfunction/ ef 84-16%  . ULNAR NERVE TRANSPOSITION Right 04/28/2014   Procedure: RIGHT ELBOW ULNA NERVE RELEASE TRANSPOSTION AND MEDIAL EPICONDYLAR DEBRIDEMENT AND REPAIR;  Surgeon: Linna Hoff, MD;  Location: [place];  Service: Orthopedics;  Laterality: Right;    Family History  Problem Relation Age of  Onset  . Depression Sister   . Rectal cancer Sister   . Lung cancer Brother   . Kidney disease Mother   . Depression Mother   . Stroke Father   . COPD Father   . Hypertension Father   . Prostate cancer Father   . Depression Daughter   . Drug abuse Daughter   . Psychiatric Illness Son    Social History:  reports that he has never smoked. He has never used smokeless tobacco. He reports current alcohol use. He reports that he does not use drugs.  Allergies:  Allergies  Allergen Reactions  . Percocet [Oxycodone-Acetaminophen] Itching    Can take generic.  States only has a problem with percocet brand, also headache  . Vicodin [Hydrocodone-Acetaminophen]     Itching and sleep disruption, can take generic    No medications prior to admission.    No results found. However, due to the size of the patient record, not all encounters were searched. Please check Results Review for a complete set of results. No results found.  Review of Systems  Constitutional: Negative for chills and fever.  Genitourinary: Positive for flank pain and frequency.  All other systems reviewed and are negative.   There were no vitals taken for this visit. Physical  Exam HENT:     Nose: Nose normal.     Mouth/Throat:     Mouth: Mucous membranes are moist.  Eyes:     Pupils: Pupils are equal, round, and reactive to light.  Cardiovascular:     Rate and Rhythm: Normal rate.     Pulses: Normal pulses.  Pulmonary:     Effort: Pulmonary effort is normal.  Abdominal:     General: Abdomen is flat.  Genitourinary:    Comments: NO CVAT at present.  Musculoskeletal:        General: Normal range of motion.     Cervical back: Normal range of motion.  Skin:    General: Skin is warm.  Neurological:     General: No focal deficit present.     Mental Status: He is alert.  Psychiatric:        Mood and Affect: Mood normal.      Assessment/Plan  Proceed as planned with RIGHT shockwave lithotripsy. Risks, benefits, alternatives, expected peri-treatment course discussed previously and reiterated today.   Alexis Frock, MD 11/20/2020, 5:41 AM

## 2020-11-21 ENCOUNTER — Encounter (HOSPITAL_BASED_OUTPATIENT_CLINIC_OR_DEPARTMENT_OTHER): Payer: Self-pay | Admitting: Urology

## 2020-11-22 ENCOUNTER — Ambulatory Visit (INDEPENDENT_AMBULATORY_CARE_PROVIDER_SITE_OTHER): Payer: No Typology Code available for payment source | Admitting: Licensed Clinical Social Worker

## 2020-11-22 ENCOUNTER — Other Ambulatory Visit: Payer: Self-pay

## 2020-11-22 DIAGNOSIS — F319 Bipolar disorder, unspecified: Secondary | ICD-10-CM

## 2020-11-23 ENCOUNTER — Encounter (HOSPITAL_COMMUNITY): Payer: Self-pay | Admitting: Licensed Clinical Social Worker

## 2020-11-23 NOTE — Progress Notes (Signed)
Virtual Phone Note  I connected with Alex [name] on 11/23/2020 at 4:00 pm EST by phone-enabled virtual visit. I verified that I am speaking with the correct person using  two identifiers.I discussed the limitations of evaluation and management by telemedicine and the availability of in person appointments. The patient expressed understanding and agreed to proceed. Patient was unable to use video due to lack of privacy.  LOCATION: Patient:Home Provider: Home office  History of Present Illness:  Pt was referred by Dr. Adele Schilder for OP therapy for bipolar disorder and anxiety.    Observations/Objective: Patient presented for today'[name] session on time and was alert, oriented x5, with no evidence or self-report of SI/HI or A/V H.  Patient reported ongoing compliance with medication and denied any use of alcohol or illicit substances.  Clinician inquired about patient'[name] current emotional ratings, as well as any significant changes in thoughts, feelings or behavior since previous session.  Patient reported scores of 5/10 for depression, 5/10 for anxiety, 3/10 for anger/irritability. Pt reports on continued health issues, which is reducing my quality of life. I just had kidney stones removed Monday." Clinician utilized CBT to process thoughts and feelings of continued health issues. Pt still continues to identify his biggest stressor: adult children with mental health issues who live with him and cannot live on their own. This is an ongoing issues and pt refuses to change behaviors to improve his quality of life.  Pt continues to read "Codependent No More." [name] and pt explored his codependent traits with suggestions: "letting go."   PLAN: Will review questions of screenings at next session.         Follow Up Instructions:  I discussed the assessment and treatment plan with the patient. The patient was provided an opportunity to ask questions and all were answered. The patient agreed with the plan and  demonstrated an understanding of the instructions.   The patient was advised to call back or seek an in-person evaluation if the symptoms worsen or if the condition fails to improve as anticipated.  I provided 60 minutes of non-face-to-face time during this encounter.   Alex [name], LCAS

## 2020-11-25 ENCOUNTER — Other Ambulatory Visit: Payer: Self-pay | Admitting: Internal Medicine

## 2020-11-27 ENCOUNTER — Telehealth: Payer: Self-pay | Admitting: Internal Medicine

## 2020-11-27 NOTE — Telephone Encounter (Signed)
Patient called and said that when he wakes up that there is blood on his pillow. An appointment was offered. Patient declined at this time. He is requesting a call back at [phone]

## 2020-11-28 ENCOUNTER — Encounter (HOSPITAL_COMMUNITY): Payer: Self-pay | Admitting: Psychiatry

## 2020-11-28 ENCOUNTER — Telehealth (INDEPENDENT_AMBULATORY_CARE_PROVIDER_SITE_OTHER): Payer: No Typology Code available for payment source | Admitting: Psychiatry

## 2020-11-28 ENCOUNTER — Other Ambulatory Visit: Payer: Self-pay

## 2020-11-28 VITALS — Wt 177.0 lb

## 2020-11-28 DIAGNOSIS — F411 Generalized anxiety disorder: Secondary | ICD-10-CM

## 2020-11-28 DIAGNOSIS — F319 Bipolar disorder, unspecified: Secondary | ICD-10-CM | POA: Diagnosis not present

## 2020-11-28 MED ORDER — LAMOTRIGINE ER 300 MG PO TB24
1.0000 | ORAL_TABLET | ORAL | 0 refills | Status: DC
Start: 2020-11-28 — End: 2021-02-27

## 2020-11-28 MED ORDER — ZOLPIDEM TARTRATE 10 MG PO TABS: ORAL_TABLET | ORAL | 0 refills | Status: DC

## 2020-11-28 MED ORDER — CLONAZEPAM 0.5 MG PO TABS: ORAL_TABLET | ORAL | 0 refills | Status: DC

## 2020-11-28 NOTE — Progress Notes (Signed)
Virtual Visit via Video Note  I connected with Alex [name] on 11/28/20 at 11:20 AM EDT by a video enabled telemedicine application and verified that I am speaking with the correct person using two identifiers.  Location: Patient: home Provider: home office   I discussed the limitations of evaluation and management by telemedicine and the availability of in person appointments. The patient expressed understanding and agreed to proceed.  History of Present Illness: Patient is evaluated by phone session.  He is taking his medicine but sometimes he feels the Ativan is not working as good.  His struggle with anxiety and sometimes insomnia.  Recently he had lithotripsy for his kidney stone and he is feeling better.  He has chronic triggers which he described most of the family situation.  Her daughter and son are the biggest contributor to his anxiety.  Sometimes he feels that his wife is not with him on family matters.  He is in therapy with [name] and get better sometimes does not follow the recommendation from his therapist.  Lately he is also concerned about finances as he received a huge bill for recent procedure.  Denies any highs and lows, mania, anger, suicidal thoughts.  He denies any paranoia or any hallucination.  His appetite is okay.  His energy level is okay.  Recently he had blood work which is normal.   Past Psychiatric History:Reviewed. [facility]depression[facility]mood swing[facility] but[facility]. No h/opsychosis buth/o poor impulse control. In the past tookSeroquel,Cymbaltaand Remeron (increase depression)  Recent Results (from the past 2160 hour(s))  Lipid panel     Status: None   Collection Time: 10/30/20  9:58 AM  Result Value Ref Range   Cholesterol, Total 180 100 - 199 mg/dL   Triglycerides 98 0 - 149 mg/dL   HDL 84 >39 mg/dL   VLDL Cholesterol Cal 17 5 - 40 mg/dL   LDL Chol Calc (NIH) 79 0 - 99 mg/dL    Chol/HDL Ratio 2.1 0.0 - 5.0 ratio    Comment:                                   T. Chol/HDL Ratio                                             Men  Women                               1/2 Avg.Risk  3.4    3.3                                   Avg.Risk  5.0    4.4                                2X Avg.Risk  9.6    7.1                                3X Avg.Risk 23.4   11.0   Hepatic function panel     Status:  None   Collection Time: 10/30/20  9:58 AM  Result Value Ref Range   Total Protein 6.6 6.0 - 8.5 g/dL   Albumin 4.5 3.8 - 4.8 g/dL   Bilirubin Total 0.8 0.0 - 1.2 mg/dL   Bilirubin, Direct 0.26 0.00 - 0.40 mg/dL   Alkaline Phosphatase 82 44 - 121 IU/L   AST 35 0 - 40 IU/L   ALT 40 0 - 44 IU/L  SARS CORONAVIRUS 2 (TAT 6-24 HRS) Nasopharyngeal Nasopharyngeal Swab     Status: None   Collection Time: 11/17/20  3:17 PM   Specimen: Nasopharyngeal Swab  Result Value Ref Range   SARS Coronavirus 2 NEGATIVE NEGATIVE    Comment: (NOTE) SARS-CoV-2 target nucleic acids are NOT DETECTED.  The SARS-CoV-2 RNA is generally detectable in upper and lower respiratory specimens during the acute phase of infection. Negative results do not preclude SARS-CoV-2 infection, do not rule out co-infections with other pathogens, and should not be used as the sole basis for treatment or other patient management decisions. Negative results must be combined with clinical observations, patient history, and epidemiological information. The expected result is Negative.  Fact Sheet for Patients: SugarRoll.be  Fact Sheet for Healthcare Providers: https://www.woods-mathews.com/  This test is not yet approved or cleared by the [place] FDA and  has been authorized for detection and/or diagnosis of SARS-CoV-2 by FDA under an Emergency Use Authorization (EUA). This EUA will remain  in effect (meaning this test can be used) for the duration of the COVID-19 declaration  under Se ction 564(b)(1) of the Act, 21 U.S.C. section 360bbb-3(b)(1), unless the authorization is terminated or revoked sooner.  Performed at [facility], [place] [address], [place], [place] [address]     Psychiatric Specialty Exam: Physical Exam  Review of Systems  Weight 177 lb (80.3 kg).There is no height or weight on file to calculate BMI.  General Appearance: Casual  Eye Contact:  Good  Speech:  Clear and Coherent  Volume:  Normal  Mood:  Irritable  Affect:  Congruent  Thought Process:  Goal Directed  Orientation:  Full (Time, Place, and Person)  Thought Content:  Logical  Suicidal Thoughts:  No  Homicidal Thoughts:  No  Memory:  Immediate;   Good Recent;   Good Remote;   Good  Judgement:  Intact  Insight:  Present  Psychomotor Activity:  Normal  Concentration:  Concentration: Good and Attention Span: Good  Recall:  Good  Fund of Knowledge:  Good  Language:  Good  Akathisia:  No  Handed:  Right  AIMS (if indicated):     Assets:  Communication Skills Desire for Improvement Housing Resilience Social Support Transportation  ADL's:  Intact  Cognition:  WNL  Sleep:   ok      Assessment and Plan: Bipolar disorder type I.  Generalized anxiety disorder.  Discuss his stressors and contributing factors.  Recommend to follow the recommendation of therapist.  I also encouraged that he should walk every day to relax himself.  He does go to church on Sundays.  I recommend to try Klonopin if he feels the Ativan is not working.  He agreed to give a try.  We will stop the Klonopin 0.5 mg and he can take half to 1 tablet during the day and full tablet at bedtime.  Recommend not to take the lorazepam.  Continue Ambien 5 mg at bedtime and Lamictal 300 mg daily.  Encouraged to keep appointment and follow the recommendations from therapy.  Recommended to call us back if any questions or any concerns.  Follow up in 3 months.  Follow Up Instructions:    I discussed the  assessment and treatment plan with the patient. The patient was provided an opportunity to ask questions and all were answered. The patient agreed with the plan and demonstrated an understanding of the instructions.   The patient was advised to call back or seek an in-person evaluation if the symptoms worsen or if the condition fails to improve as anticipated.  I provided 18 minutes of non-face-to-face time during this encounter.   Kathlee Nations, MD

## 2020-12-04 ENCOUNTER — Encounter (HOSPITAL_COMMUNITY): Payer: Self-pay | Admitting: Licensed Clinical Social Worker

## 2020-12-04 ENCOUNTER — Other Ambulatory Visit: Payer: Self-pay

## 2020-12-04 ENCOUNTER — Ambulatory Visit (INDEPENDENT_AMBULATORY_CARE_PROVIDER_SITE_OTHER): Payer: No Typology Code available for payment source | Admitting: Licensed Clinical Social Worker

## 2020-12-04 DIAGNOSIS — F319 Bipolar disorder, unspecified: Secondary | ICD-10-CM

## 2020-12-04 NOTE — Progress Notes (Signed)
Virtual Phone Note  I connected with Alex [name] on 12/04/2020 at 4:00 pm EST by phone-enabled virtual visit. I verified that I am speaking with the correct person using  two identifiers.I discussed the limitations of evaluation and management by telemedicine and the availability of in person appointments. The patient expressed understanding and agreed to proceed. Patient was unable to use video due to lack of privacy.  LOCATION: Patient:Home Provider: Home office  History of Present Illness:  Pt was referred by Dr. Adele Schilder for OP therapy for bipolar disorder and anxiety.    Observations/Objective: Patient presented for today's session on time and was alert, oriented x5, with no evidence or self-report of SI/HI or A/V H.  Patient reported ongoing compliance with medication and denied any use of alcohol or illicit substances.  Clinician inquired about patient's current emotional ratings, as well as any significant changes in thoughts, feelings or behavior since previous session.  Patient reported scores of 5/10 for depression, 5/10 for anxiety, 3/10 for anger/irritability. Pt still continues to identify his biggest stressor: adult children with mental health issues who live with him and cannot live on their own. This is an ongoing issues and pt refuses to change behaviors to improve his quality of life. Pt discussed this issue with Dr. Adele Schilder, psychiatrist, pt admits he does not follow through with suggestions from [name].discussed the progress of  therapy - 5years and the pt is still working on the same family issues. He does not change his negative thoughts, which turn into negative behaviors.  Pt continues to read "Codependent No More." [name] and pt explored his codependent traits with suggestions: "letting go."           Follow Up Instructions:  I discussed the assessment and treatment plan with the patient. The patient was provided an opportunity to ask questions and all were answered. The  patient agreed with the plan and demonstrated an understanding of the instructions.   The patient was advised to call back or seek an in-person evaluation if the symptoms worsen or if the condition fails to improve as anticipated.  I provided 60 minutes of non-face-to-face time during this encounter.   Valentine Barney S, LCAS

## 2020-12-06 ENCOUNTER — Other Ambulatory Visit: Payer: No Typology Code available for payment source

## 2020-12-06 ENCOUNTER — Ambulatory Visit: Payer: No Typology Code available for payment source | Admitting: Surgery

## 2020-12-12 ENCOUNTER — Other Ambulatory Visit: Payer: Self-pay | Admitting: Cardiology

## 2020-12-13 ENCOUNTER — Ambulatory Visit: Payer: No Typology Code available for payment source | Admitting: Surgery

## 2020-12-13 ENCOUNTER — Encounter: Payer: Self-pay | Admitting: Surgery

## 2020-12-13 ENCOUNTER — Other Ambulatory Visit: Payer: Self-pay

## 2020-12-13 VITALS — BP 144/90 | HR 90 | Resp 20 | Ht 72.0 in | Wt 182.0 lb

## 2020-12-13 DIAGNOSIS — I712 Thoracic aortic aneurysm, without rupture, unspecified: Secondary | ICD-10-CM

## 2020-12-13 NOTE — Progress Notes (Signed)
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  It was noted to be 3.7 cm in 2007.  He denies any chest or back pain.  He does report that he has had some exertional shortness of breath as well as some edema in his ankles.  Current Outpatient Medications  Medication Sig Dispense Refill  . aspirin EC 81 MG tablet Take 1 tablet by mouth daily.    [name] atorvastatin (LIPITOR) 40 MG tablet Take 1 tablet (40 mg total) by mouth daily. 90 tablet 3  . AXIRON 30 MG/ACT SOLN Place 2 Act onto the skin daily.    . clonazePAM (KLONOPIN) 0.5 MG tablet Take 1/2 to one tab during day and one tab bed time for anxiety 45 tablet 0  . Coenzyme Q10 (CO Q 10 PO) Take 1 capsule by mouth daily.     . Immune Globulin, Human, 4 GM/20ML SOLN Inject into the skin once a week. wednesday    . LamoTRIgine 300 MG TB24 24 hour tablet Take 1 tablet (300 mg total) by mouth every morning. 90 tablet 0  . latanoprost (XALATAN) 0.005 % ophthalmic solution 1 DROP EACH EYE AT NIGHT    . levothyroxine (SYNTHROID) 150 MCG tablet TAKE 1 TABLET BY MOUTH EVERY DAY BEFORE BREAKFAST 90 tablet 3  . LUTEIN PO Take 1 tablet by mouth daily.     [name] LYCOPENE PO Take by mouth.    . Multiple Vitamin (MULTI-VITAMINS) TABS Take by mouth.    . OMEGA-3 KRILL OIL PO Take 1 capsule by mouth daily.     [name] omeprazole (PRILOSEC) 20 MG capsule Take 20 mg by mouth every evening.    . sildenafil (REVATIO) 20 MG tablet Take 1 tablet by mouth once daily 30 tablet 3  . tadalafil (CIALIS) 20 MG tablet TAKE 1 TABLET BY MOUTH ONCE DAILY AS NEEDED FOR ERECTILE DYSFUNCTION (Patient taking differently: 5 mg.) 30 tablet 2  . telmisartan (MICARDIS) 80 MG tablet TAKE 1 TABLET BY MOUTH EVERY DAY 90 tablet 0  . timolol (TIMOPTIC) 0.5 % ophthalmic solution timolol maleate 0.5 % eye drops  INSTILL 1 DROP INTO RIGHT EYE TWICE A DAY    . zolpidem (AMBIEN) 10 MG tablet  TAKE 1/2 (ONE-HALF) TABLET BY MOUTH AT BEDTIME AND  TAKE  OTHER  HALF  IF  NEEDED 30 tablet 0  . Fluorouracil (TOLAK) 4 % CREA Apply 1 application topically at bedtime. (Patient not taking: Reported on 12/13/2020) 40 g 0  . LORazepam (ATIVAN) 0.5 MG tablet Take 1 tablet (0.5 mg total) by mouth 2 (two) times daily. (Patient not taking: Reported on 12/13/2020) 60 tablet 2  . meloxicam (MOBIC) 15 MG tablet TAKE 1 TABLET BY MOUTH DAILY AS NEEDED. (Patient not taking: Reported on 12/13/2020) 90 tablet 0  . oxyCODONE-acetaminophen (PERCOCET) 5-325 MG tablet Take 1-2 tablets by mouth every 6 (six) hours as needed for moderate pain or severe pain. After Lithotripsy (Patient not taking: Reported on 12/13/2020) 20 tablet 0  . promethazine (PHENERGAN) 50 MG tablet Take 1 tablet (50 mg total) by mouth every 6 (six) hours as needed for nausea or vomiting (or Itching). After Lithotripsy and with Pain meds (Patient not taking: Reported on 12/13/2020) 20 tablet 0  . tamsulosin (FLOMAX) 0.4 MG CAPS capsule TAKE 1 CAPSULE BY MOUTH EVERY DAY (Patient not taking: Reported on 12/13/2020) 90 capsule  1   No current facility-administered medications for this visit.     Physical Exam: BP (!) 144/90 (BP Location: Left Arm, Patient Position: Sitting)   Pulse 90   Resp 20   Ht 6' (1.829 m)   Wt 182 lb (82.6 kg)   SpO2 96% Comment: RA  BMI 24.68 kg/m  He looks well. Cardiac exam shows a regular rate and rhythm with normal heart sounds.  There is no murmur. Lungs are clear. There is mild bilateral ankle edema.  Diagnostic Tests:  CLINICAL DATA:  Thoracic aortic aneurysm follow-up.  EXAM: CT ANGIOGRAPHY CHEST WITH CONTRAST  TECHNIQUE: Multidetector CT imaging of the chest was performed using the standard protocol during bolus administration of intravenous contrast. Multiplanar CT image reconstructions and MIPs were obtained to evaluate the vascular anatomy.  CONTRAST:  64mL ISOVUE-370 IOPAMIDOL (ISOVUE-370) INJECTION  76%  COMPARISON:  June 13, 2017  FINDINGS: Cardiovascular: There is no evidence for thoracic aortic dissection. There are mild atherosclerotic changes of the thoracic aorta. On the current study, there is no evidence for a thoracic aortic aneurysm. The ascending thoracic aorta measures approximately 3.9 cm in diameter. The arch vessels are grossly patent. There is no large centrally located pulmonary embolism. The heart size is unremarkable.  Mediastinum/Nodes:  -- No mediastinal lymphadenopathy.  -- No hilar lymphadenopathy.  -- No axillary lymphadenopathy.  -- No supraclavicular lymphadenopathy.  --the patient is status post prior thyroidectomy.  -  Unremarkable esophagus.  Lungs/Pleura: Airways are patent. No pleural effusion, lobar consolidation, pneumothorax or pulmonary infarction.  Upper Abdomen: Contrast bolus timing is not optimized for evaluation of the abdominal organs. The visualized portions of the organs of the upper abdomen are normal.  Musculoskeletal: There is no acute displaced fracture. There are old healed left-sided rib fractures.  Review of the MIP images confirms the above findings.  IMPRESSION: 1. No evidence of thoracic aortic dissection or aneurysm. 2. No acute findings in the chest. 3. Status post prior thyroidectomy.  Aortic Atherosclerosis (ICD10-I70.0).   Electronically Signed   By: Constance Holster [name]   On: 09/13/2020 20:57  Impression:  His ascending aorta is stable at 3.9 cm.  I think the variation in measurement over the past few years has been technique comparing MRA to CTA.  His aorta is mildly enlarged I certainly well below the surgical threshold of 5.5 cm.  I reviewed the CTA images with him and answered his questions.  I stressed the importance of continued good blood pressure control in preventing further enlargement and acute aortic dissection.  Plan:  He will follow-up with me in 1 year with a  CTA of the chest.  He is going to follow-up with cardiology concerning his exertional shortness of breath and lower extremity edema.  I spent 20 minutes performing this established patient evaluation and > 50% of this time was spent face to face counseling and coordinating the care of this patient's aortic aneurysm.    Gaye Pollack, MD [facility] [phone]

## 2020-12-18 ENCOUNTER — Other Ambulatory Visit: Payer: Self-pay

## 2020-12-18 ENCOUNTER — Ambulatory Visit (INDEPENDENT_AMBULATORY_CARE_PROVIDER_SITE_OTHER): Payer: No Typology Code available for payment source | Admitting: Licensed Clinical Social Worker

## 2020-12-18 ENCOUNTER — Encounter (HOSPITAL_COMMUNITY): Payer: Self-pay | Admitting: Licensed Clinical Social Worker

## 2020-12-18 DIAGNOSIS — F319 Bipolar disorder, unspecified: Secondary | ICD-10-CM | POA: Diagnosis not present

## 2020-12-18 NOTE — Progress Notes (Signed)
Virtual Phone Note  I connected with Alex [name] on 12/18/2020 at 4:00 pm EST by phone-enabled virtual visit. I verified that I am speaking with the correct person using  two identifiers.I discussed the limitations of evaluation and management by telemedicine and the availability of in person appointments. The patient expressed understanding and agreed to proceed. Patient was unable to use video due to lack of privacy.  LOCATION: Patient:Home Provider: Home office  History of Present Illness:  Pt was referred by Dr. Adele [name] for OP therapy for bipolar disorder and anxiety.    Observations/Objective: Patient presented for today's session on time and was alert, oriented x5, with no evidence or self-report of SI/HI or A/V H.  Patient reported ongoing compliance with medication and denied any use of alcohol or illicit substances.  Clinician inquired about patient's current emotional ratings, as well as any significant changes in thoughts, feelings or behavior since previous session.  Patient reported scores of 5/10 for depression, 5/10 for anxiety, 3/10 for anger/irritability. [name] reviewed tx plan with pt who verbalized acceptance of the plan. Pt still continues to identify his biggest stressor: adult children with mental health issues who live with him and cannot live on their own. This is an ongoing issues and pt refuses to change behaviors to improve his quality of life.Pt discussed his continued lack of affection from his marriage. [name] assessed family dynamics. [name] used cbt for relationship issues, becoming more present, becoming happier in personal life= happier couple.     Plan: Codependent No More., "letting go, ." Relationship without affection          Follow Up Instructions:  I discussed the assessment and treatment plan with the patient. The patient was provided an opportunity to ask questions and all were answered. The patient agreed with the plan and demonstrated an understanding  of the instructions.   The patient was advised to call back or seek an in-person evaluation if the symptoms worsen or if the condition fails to improve as anticipated.  I provided 60 minutes of non-face-to-face time during this encounter.   Alex [name] S, LCAS

## 2020-12-19 ENCOUNTER — Other Ambulatory Visit: Payer: Self-pay | Admitting: Cardiology

## 2020-12-19 ENCOUNTER — Other Ambulatory Visit: Payer: Self-pay | Admitting: Internal Medicine

## 2020-12-28 ENCOUNTER — Ambulatory Visit (INDEPENDENT_AMBULATORY_CARE_PROVIDER_SITE_OTHER): Payer: No Typology Code available for payment source | Admitting: Licensed Clinical Social Worker

## 2020-12-28 ENCOUNTER — Other Ambulatory Visit: Payer: Self-pay

## 2020-12-28 DIAGNOSIS — F319 Bipolar disorder, unspecified: Secondary | ICD-10-CM

## 2021-01-01 ENCOUNTER — Ambulatory Visit (HOSPITAL_COMMUNITY): Payer: No Typology Code available for payment source | Admitting: Licensed Clinical Social Worker

## 2021-01-01 ENCOUNTER — Encounter (HOSPITAL_COMMUNITY): Payer: Self-pay | Admitting: Licensed Clinical Social Worker

## 2021-01-01 NOTE — Progress Notes (Signed)
Virtual Phone Note  I connected with Alex [name] on 12/28/2020 at 3:00 pm EST by phone-enabled virtual visit. I verified that I am speaking with the correct person using  two identifiers.I discussed the limitations of evaluation and management by telemedicine and the availability of in person appointments. The patient expressed understanding and agreed to proceed. Patient was unable to use video due to lack of privacy.  LOCATION: Patient:Home Provider: Home office  History of Present Illness:  Pt was referred by Dr. Adele Schilder for OP therapy for bipolar disorder and anxiety.    Observations/Objective: Patient presented for today's session on time and was alert, oriented x5, with no evidence or self-report of SI/HI or A/V H.  Patient reported ongoing compliance with medication and denied any use of alcohol or illicit substances.  Clinician inquired about patient's current emotional ratings, as well as any significant changes in thoughts, feelings or behavior since previous session.  Patient reported scores of 5/10 for depression, 5/10 for anxiety, 3/10 for anger/irritability. Pt still continues to identify his biggest stressor: adult children with mental health issues who live with him and cannot live on their own. This is an ongoing issues and pt refuses to change behaviors to improve his quality of life. [name] and pt explored his family dynamics, parenting adult children, making decisions for retirement.  Plan: Codependent No More., "letting go, ." Relationship without affection  Follow Up Instructions:  I discussed the assessment and treatment plan with the patient. The patient was provided an opportunity to ask questions and all were answered. The patient agreed with the plan and demonstrated an understanding of the instructions.   The patient was advised to call back or seek an in-person evaluation if the symptoms worsen or if the condition fails to improve as anticipated.  I provided 45  minutes of non-face-to-face time during this encounter.   Lindalee Huizinga S, LCAS

## 2021-01-10 ENCOUNTER — Encounter (HOSPITAL_COMMUNITY): Payer: Self-pay | Admitting: Licensed Clinical Social Worker

## 2021-01-10 ENCOUNTER — Ambulatory Visit (INDEPENDENT_AMBULATORY_CARE_PROVIDER_SITE_OTHER): Payer: No Typology Code available for payment source | Admitting: Licensed Clinical Social Worker

## 2021-01-10 ENCOUNTER — Ambulatory Visit (HOSPITAL_COMMUNITY): Payer: No Typology Code available for payment source | Admitting: Licensed Clinical Social Worker

## 2021-01-10 ENCOUNTER — Other Ambulatory Visit: Payer: Self-pay

## 2021-01-10 DIAGNOSIS — F319 Bipolar disorder, unspecified: Secondary | ICD-10-CM | POA: Diagnosis not present

## 2021-01-10 NOTE — Progress Notes (Signed)
Virtual Phone Note  I connected with Alex [name] on 01/10/2021 at 3:00 pm EST by phone-enabled virtual visit. I verified that I am speaking with the correct person using  two identifiers.I discussed the limitations of evaluation and management by telemedicine and the availability of in person appointments. The patient expressed understanding and agreed to proceed. Patient was unable to use video due to lack of privacy.  LOCATION: Patient:Home Provider: Home office  History of Present Illness:  Pt was referred by Dr. Adele Schilder for OP therapy for bipolar disorder and anxiety.    Observations/Objective: Patient presented for today's session on time and was alert, oriented x5, with no evidence or self-report of SI/HI or A/V H.  Patient reported ongoing compliance with medication and denied any use of alcohol or illicit substances.  Clinician inquired about patient's current emotional ratings, as well as any significant changes in thoughts, feelings or behavior since previous session.  Patient reported scores of 5/10 for depression, 5/10 for anxiety, 3/10 for anger/irritability. Pt still continues to identify his biggest stressor: adult children with mental health issues who live with him and cannot live on their own. Pt reports he and his wife are going to [place] for 2 weeks awaiting the arrival of a new grand baby. [name] encouraged pt to use self care while he is gone. [name] discussed coping skills to use during crisis. [name] used CBT to assist patient in "letting go."   PLAN: CCA   Plan: Codependent No More., "letting go, ."  Follow Up Instructions:  I discussed the assessment and treatment plan with the patient. The patient was provided an opportunity to ask questions and all were answered. The patient agreed with the plan and demonstrated an understanding of the instructions.   The patient was advised to call back or seek an in-person evaluation if the symptoms worsen or if the condition fails to  improve as anticipated.  I provided 45 minutes of non-face-to-face time during this encounter.   Rhonin Trott S, LCAS

## 2021-01-23 ENCOUNTER — Ambulatory Visit (INDEPENDENT_AMBULATORY_CARE_PROVIDER_SITE_OTHER): Payer: No Typology Code available for payment source | Admitting: Licensed Clinical Social Worker

## 2021-01-23 ENCOUNTER — Encounter (HOSPITAL_COMMUNITY): Payer: Self-pay | Admitting: Licensed Clinical Social Worker

## 2021-01-23 ENCOUNTER — Other Ambulatory Visit: Payer: Self-pay

## 2021-01-23 DIAGNOSIS — F319 Bipolar disorder, unspecified: Secondary | ICD-10-CM
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
## 2021-01-31 ENCOUNTER — Other Ambulatory Visit: Payer: Self-pay

## 2021-01-31 ENCOUNTER — Encounter: Payer: Self-pay | Admitting: Internal Medicine

## 2021-01-31 ENCOUNTER — Ambulatory Visit: Payer: No Typology Code available for payment source | Admitting: Internal Medicine

## 2021-01-31 ENCOUNTER — Other Ambulatory Visit (HOSPITAL_COMMUNITY): Payer: Self-pay | Admitting: Psychiatry

## 2021-01-31 VITALS — BP 152/90 | HR 74 | Temp 98.9°F | Ht 72.0 in | Wt 180.6 lb

## 2021-01-31 DIAGNOSIS — F329 Major depressive disorder, single episode, unspecified: Secondary | ICD-10-CM

## 2021-01-31 DIAGNOSIS — F439 Reaction to severe stress, unspecified: Secondary | ICD-10-CM

## 2021-01-31 DIAGNOSIS — F411 Generalized anxiety disorder: Secondary | ICD-10-CM

## 2021-01-31 DIAGNOSIS — R5382 Chronic fatigue, unspecified: Secondary | ICD-10-CM | POA: Diagnosis not present

## 2021-01-31 DIAGNOSIS — R7309 Other abnormal glucose: Secondary | ICD-10-CM

## 2021-01-31 DIAGNOSIS — E291 Testicular hypofunction: Secondary | ICD-10-CM

## 2021-01-31 DIAGNOSIS — R600 Localized edema: Secondary | ICD-10-CM | POA: Diagnosis not present

## 2021-01-31 LAB — BASIC METABOLIC PANEL
BUN: 24 mg/dL — ABNORMAL HIGH (ref 6–23)
CO2: 24 mEq/L (ref 19–32)
Calcium: 9 mg/dL (ref 8.4–10.5)
Chloride: 101 mEq/L (ref 96–112)
Creatinine, Ser: 0.97 mg/dL (ref 0.40–1.50)
GFR: 80.98 mL/min (ref >60.00)
Glucose, Bld: 102 mg/dL — ABNORMAL HIGH (ref 70–99)
Potassium: 4.1 mEq/L (ref 3.5–5.1)
Sodium: 134 mEq/L — ABNORMAL LOW (ref 135–145)

## 2021-01-31 LAB — T4, FREE: Free T4: 0.84 ng/dL (ref 0.60–1.60)

## 2021-01-31 LAB — BRAIN NATRIURETIC PEPTIDE: Pro B Natriuretic peptide (BNP): 41 pg/mL (ref 0.0–100.0)

## 2021-01-31 LAB — TESTOSTERONE: Testosterone: 729.42 ng/dL (ref 300.00–890.00)

## 2021-01-31 LAB — TSH: TSH: 2.09 u[IU]/mL (ref 0.35–4.50)

## 2021-01-31 NOTE — Assessment & Plan Note (Addendum)
Worse Check labs including thyroid levels, testosterone, CBC, c-Met

## 2021-01-31 NOTE — Progress Notes (Signed)
Subjective:  Patient ID: Alex [name], male    DOB: [birth year]  Age: 65 y.o. MRN: [id number]  CC: Foot Swelling and Fatigue   HPI Alex [name] presents for fatigue, edema - worse   Outpatient Medications Prior to Visit  Medication Sig Dispense Refill   aspirin EC 81 MG tablet Take 1 tablet by mouth daily.     atorvastatin (LIPITOR) 40 MG tablet Take 1 tablet (40 mg total) by mouth daily. 90 tablet 3   AXIRON 30 MG/ACT SOLN Place 2 Act onto the skin daily.     Coenzyme Q10 (CO Q 10 PO) Take 1 capsule by mouth daily.      Immune Globulin, Human, 4 GM/20ML SOLN Inject into the skin once a week. wednesday     LamoTRIgine 300 MG TB24 24 hour tablet Take 1 tablet (300 mg total) by mouth every morning. 90 tablet 0   latanoprost (XALATAN) 0.005 % ophthalmic solution 1 DROP EACH EYE AT NIGHT     levothyroxine (SYNTHROID) 150 MCG tablet Take 1 tablet (150 mcg total) by mouth daily before breakfast. Annual appt due in July must see provider for future refills 90 tablet 0   LORazepam (ATIVAN) 0.5 MG tablet Take 1 tablet (0.5 mg total) by mouth 2 (two) times daily. 60 tablet 2   LUTEIN PO Take 1 tablet by mouth daily.      LYCOPENE PO Take by mouth.     meloxicam (MOBIC) 15 MG tablet TAKE 1 TABLET BY MOUTH DAILY AS NEEDED. 90 tablet 0   Multiple Vitamin (MULTI-VITAMINS) TABS Take by mouth.     OMEGA-3 KRILL OIL PO Take 1 capsule by mouth daily.      omeprazole (PRILOSEC) 20 MG capsule Take 20 mg by mouth every evening.     sildenafil (REVATIO) 20 MG tablet Take 1 tablet by mouth once daily 30 tablet 3   tadalafil (CIALIS) 20 MG tablet TAKE 1 TABLET BY MOUTH ONCE DAILY AS NEEDED FOR ERECTILE DYSFUNCTION (Patient taking differently: 5 mg.) 30 tablet 2   tamsulosin (FLOMAX) 0.4 MG CAPS capsule TAKE 1 CAPSULE BY MOUTH EVERY DAY (Patient taking differently: as needed.) 90 capsule 1   telmisartan (MICARDIS) 80 MG tablet TAKE 1 TABLET BY MOUTH EVERY DAY 90 tablet 1   timolol (TIMOPTIC) 0.5 %  ophthalmic solution timolol maleate 0.5 % eye drops  INSTILL 1 DROP INTO RIGHT EYE TWICE A DAY     zolpidem (AMBIEN) 10 MG tablet TAKE 1/2 (ONE-HALF) TABLET BY MOUTH AT BEDTIME AND  TAKE  OTHER  HALF  IF  NEEDED 30 tablet 0   clonazePAM (KLONOPIN) 0.5 MG tablet Take 1/2 to one tab during day and one tab bed time for anxiety (Patient not taking: Reported on 01/31/2021) 45 tablet 0   Fluorouracil (TOLAK) 4 % CREA Apply 1 application topically at bedtime. (Patient not taking: No sig reported) 40 g 0   oxyCODONE-acetaminophen (PERCOCET) 5-325 MG tablet Take 1-2 tablets by mouth every 6 (six) hours as needed for moderate pain or severe pain. After Lithotripsy (Patient not taking: No sig reported) 20 tablet 0   promethazine (PHENERGAN) 50 MG tablet Take 1 tablet (50 mg total) by mouth every 6 (six) hours as needed for nausea or vomiting (or Itching). After Lithotripsy and with Pain meds (Patient not taking: No sig reported) 20 tablet 0   No facility-administered medications prior to visit.    ROS: Review of Systems  Constitutional:  Positive for fatigue. Negative for  appetite change and unexpected weight change.  HENT:  Negative for congestion, nosebleeds, sneezing, sore throat and trouble swallowing.   Eyes:  Negative for itching and visual disturbance.  Respiratory:  Positive for shortness of breath. Negative for cough.   Cardiovascular:  Positive for leg swelling. Negative for chest pain and palpitations.  Gastrointestinal:  Negative for abdominal distention, blood in stool, diarrhea and nausea.  Genitourinary:  Negative for frequency and hematuria.  Musculoskeletal:  Positive for arthralgias and gait problem. Negative for back pain, joint swelling and neck pain.  Skin:  Negative for rash.  Neurological:  Negative for dizziness, tremors, speech difficulty and weakness.  Psychiatric/Behavioral:  Positive for dysphoric mood and sleep disturbance. Negative for agitation and suicidal ideas. The  patient is nervous/anxious.    Objective:  BP (!) 152/90 (BP Location: Left Arm)   Pulse 74   Temp 98.9 F (37.2 C) (Oral)   Ht 6' (1.829 m)   Wt 180 lb 9.6 oz (81.9 kg)   SpO2 97%   BMI 24.49 kg/m   BP Readings from Last 3 Encounters:  01/31/21 (!) 152/90  12/13/20 (!) 144/90  11/20/20 (!) 141/77    Wt Readings from Last 3 Encounters:  01/31/21 180 lb 9.6 oz (81.9 kg)  12/13/20 182 lb (82.6 kg)  11/20/20 177 lb (80.3 kg)    Physical Exam Constitutional:      General: He is not in acute distress.    Appearance: He is well-developed.     Comments: NAD  Eyes:     Conjunctiva/sclera: Conjunctivae normal.     Pupils: Pupils are equal, round, and reactive to light.  Neck:     Thyroid: No thyromegaly.     Vascular: No JVD.  Cardiovascular:     Rate and Rhythm: Normal rate and regular rhythm.     Heart sounds: Normal heart sounds. No murmur heard.   No friction rub. No gallop.  Pulmonary:     Effort: Pulmonary effort is normal. No respiratory distress.     Breath sounds: Normal breath sounds. No wheezing or rales.  Chest:     Chest wall: No tenderness.  Abdominal:     General: Bowel sounds are normal. There is no distension.     Palpations: Abdomen is soft. There is no mass.     Tenderness: There is no abdominal tenderness. There is no guarding or rebound.  Musculoskeletal:        General: Swelling and tenderness present. Normal range of motion.     Cervical back: Normal range of motion.  Lymphadenopathy:     Cervical: No cervical adenopathy.  Skin:    General: Skin is warm and dry.     Findings: No rash.  Neurological:     Mental Status: He is alert and oriented to person, place, and time.     Cranial Nerves: No cranial nerve deficit.     Motor: No abnormal muscle tone.     Coordination: Coordination normal.     Gait: Gait normal.     Deep Tendon Reflexes: Reflexes are normal and symmetric.  Psychiatric:        Behavior: Behavior normal.        Thought  Content: Thought content normal.        Judgment: Judgment normal.   Trace B edema  Lab Results  Component Value Date   WBC 10.3 02/22/2020   HGB 16.2 02/22/2020   HCT 48.7 02/22/2020   PLT 235.0 02/22/2020   GLUCOSE 134 (  H) 02/22/2020   CHOL 180 10/30/2020   TRIG 98 10/30/2020   HDL 84 10/30/2020   LDLDIRECT 59.0 02/02/2016   LDLCALC 79 10/30/2020   ALT 40 10/30/2020   AST 35 10/30/2020   NA 139 02/22/2020   K 4.1 02/22/2020   CL 104 02/22/2020   CREATININE 1.12 02/22/2020   BUN 17 02/22/2020   CO2 26 02/22/2020   TSH 5.26 (H) 02/15/2020   PSA 2.59 02/22/2020   INR 1.0 09/13/2019   HGBA1C 5.5 09/29/2018    No results found.  Assessment & Plan:     Walker Kehr, MD

## 2021-01-31 NOTE — Assessment & Plan Note (Signed)
Chronic. Worse Check TSH, BNP, BMET

## 2021-01-31 NOTE — Assessment & Plan Note (Addendum)
Discussed multiple stressors.  Continue with psychology visits.  Continue with as needed lorazepam Alex [name] is doing better

## 2021-01-31 NOTE — Assessment & Plan Note (Signed)
Check A1Chec

## 2021-02-01 ENCOUNTER — Telehealth (HOSPITAL_COMMUNITY): Payer: Self-pay

## 2021-02-01 DIAGNOSIS — F411 Generalized anxiety disorder: Secondary | ICD-10-CM

## 2021-02-01 MED ORDER — ZOLPIDEM TARTRATE 10 MG PO TABS: ORAL_TABLET | ORAL | 0 refills | Status: DC

## 2021-02-01 NOTE — Telephone Encounter (Signed)
Patient called requesting a refill on his Zolpidem 10mg  to be sent to [place] [address]. He would like to pick it up today. Followup scheduled for 7/19. Thank you

## 2021-02-01 NOTE — Telephone Encounter (Signed)
Done

## 2021-02-02 ENCOUNTER — Telehealth (HOSPITAL_COMMUNITY): Payer: Self-pay

## 2021-02-02 NOTE — Telephone Encounter (Signed)
Notified patient.

## 2021-02-05 ENCOUNTER — Ambulatory Visit (INDEPENDENT_AMBULATORY_CARE_PROVIDER_SITE_OTHER): Payer: No Typology Code available for payment source | Admitting: Licensed Clinical Social Worker

## 2021-02-05 ENCOUNTER — Other Ambulatory Visit: Payer: Self-pay

## 2021-02-05 ENCOUNTER — Encounter (HOSPITAL_COMMUNITY): Payer: Self-pay | Admitting: Licensed Clinical Social Worker

## 2021-02-05 DIAGNOSIS — F319 Bipolar disorder, unspecified: Secondary | ICD-10-CM

## 2021-02-05 NOTE — Progress Notes (Signed)
Virtual virtual video Note  I connected with Alex [name] on 02/05/2021 at 1:00 pm EST by phone-enabled virtual visit. I verified that I am speaking with the correct person using  two identifiers.I discussed the limitations of evaluation and management by telemedicine and the availability of in person appointments. The patient expressed understanding and agreed to proceed.    LOCATION: Patient:Home Provider: Home office  History of Present Illness:  Pt was referred by Dr. Adele Schilder for OP therapy for bipolar disorder and anxiety.    Observations/Objective: Patient presented for today's session on time and was alert, oriented x5, with no evidence or self-report of SI/HI or A/V H.  Patient reported ongoing compliance with medication and denied any use of alcohol or illicit substances.  Clinician inquired about patient's current emotional ratings, as well as any significant changes in thoughts, feelings or behavior since previous session.  Patient reported scores of 5/10 for depression, 5/10 for anxiety, 3/10 for anger/irritability. 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 [name] and pt provided education on "fair fighting rules" and emailed copy to pt to review with his wife. [name] role played possible scenarios of talking to his wife about the fair fighting rules.       Plan: Codependent No More., "letting go, ."  Follow Up Instructions:  I discussed the assessment and treatment plan with the patient. The patient was provided an opportunity to ask questions and all were answered. The patient agreed with the plan and demonstrated an understanding of the instructions.   The patient was advised to call back or seek an in-person evaluation if the symptoms worsen or if the condition fails to improve as anticipated.  I provided 60 minutes of non-face-to-face time during this  encounter.   Berle Fitz S, LCAS

## 2021-02-06 ENCOUNTER — Other Ambulatory Visit: Payer: Self-pay

## 2021-02-06 ENCOUNTER — Encounter (INDEPENDENT_AMBULATORY_CARE_PROVIDER_SITE_OTHER): Payer: No Typology Code available for payment source | Admitting: Ophthalmology

## 2021-02-06 DIAGNOSIS — H35033 Hypertensive retinopathy, bilateral: Secondary | ICD-10-CM

## 2021-02-06 DIAGNOSIS — H43813 Vitreous degeneration, bilateral: Secondary | ICD-10-CM | POA: Diagnosis not present

## 2021-02-06 DIAGNOSIS — H353122 Nonexudative age-related macular degeneration, left eye, intermediate dry stage: Secondary | ICD-10-CM | POA: Diagnosis not present

## 2021-02-06 DIAGNOSIS — I10 Essential (primary) hypertension: Secondary | ICD-10-CM

## 2021-02-06 NOTE — Assessment & Plan Note (Signed)
Depression is aggravated by stress and anxiety.  Regular checkups with Dr. Adele Schilder.  Alex [name] is on lamotrigine.  Continue with testosterone replacement therapy

## 2021-02-09 ENCOUNTER — Other Ambulatory Visit: Payer: No Typology Code available for payment source

## 2021-02-15 ENCOUNTER — Telehealth: Payer: Self-pay | Admitting: Internal Medicine

## 2021-02-15 NOTE — Telephone Encounter (Signed)
Place medical clearance form in purple folder. Pt just had appt 01/31/21...Alex [name]

## 2021-02-15 NOTE — Telephone Encounter (Signed)
Type of form received: Surgical Clearance   Additional comments: Surgery date TBD   Received by: [place]  Form should be Faxed to: [phone]  Form should be mailed to: n/a   Is patient requesting call for pickup: Y (just to confirm it was sent)   Form placed in the Provider's box.  *Attach charge sheet.  Provider will determine charge.*  Was patient informed of  7-10 business day turn around (Y/N)? N

## 2021-02-16 NOTE — Telephone Encounter (Signed)
Done. Thanks.

## 2021-02-16 NOTE — Telephone Encounter (Signed)
Notified pt MD completed form has been faxed to Dr, Patrice Paradise.Marland KitchenJohny Chess

## 2021-02-20 ENCOUNTER — Encounter (HOSPITAL_COMMUNITY): Payer: Self-pay | Admitting: Licensed Clinical Social Worker

## 2021-02-20 ENCOUNTER — Other Ambulatory Visit: Payer: Self-pay

## 2021-02-20 ENCOUNTER — Ambulatory Visit (INDEPENDENT_AMBULATORY_CARE_PROVIDER_SITE_OTHER): Payer: No Typology Code available for payment source | Admitting: Licensed Clinical Social Worker

## 2021-02-20 DIAGNOSIS — F319 Bipolar disorder, unspecified: Secondary | ICD-10-CM | POA: Diagnosis not present

## 2021-02-20 NOTE — Progress Notes (Signed)
Virtual virtual video Note  I connected with Alex [name] on 02/05/2021 at 1:00 pm EST by phone-enabled virtual visit. I verified that I am speaking with the correct person using  two identifiers.I discussed the limitations of evaluation and management by telemedicine and the availability of in person appointments. The patient expressed understanding and agreed to proceed.    LOCATION: Patient:Home Provider: Home office  History of Present Illness:  Pt was referred by Dr. Adele Schilder for OP therapy for bipolar disorder and anxiety.    Observations/Objective: Patient presented for today'[name] session on time and was alert, oriented x5, with no evidence or self-report of SI/HI or A/V H.  Patient reported ongoing compliance with medication and denied any use of alcohol or illicit substances.  Clinician inquired about patient'[name] current emotional ratings, as well as any significant changes in thoughts, feelings or behavior since previous session.  Patient reported scores of 5/10 for depression, 5/10 for anxiety, 3/10 for anger/irritability. Pt reports "My wife, son and I had Covid. My wife had the worse symptoms but we quarantined for 5 days in the house."  Pt reports the stress level in the house has increased due to his daughter'[name] behavior. "She yells at [place], argues with [place], acuses [place]." [name] and pt explored how to evict and family member from the house. "She is a trigger for me, I got in a screaming match with her and it makes me so angry." Clinician utilized MI OARS to affirm concerns. Clinician challenged thoughts about this. Clinician processed options for communicating his concerns.       Plan: Codependent No More., "letting go, fair fighting rules  Follow Up Instructions:  I discussed the assessment and treatment plan with the patient. The patient was provided an opportunity to ask questions and all were answered. The patient agreed with the plan and demonstrated an understanding of the  instructions.   The patient was advised to call back or seek an in-person evaluation if the symptoms worsen or if the condition fails to improve as anticipated.  I provided 60 minutes of non-face-to-face time during this encounter.   Alex [name], LCAS

## 2021-02-27 ENCOUNTER — Telehealth (INDEPENDENT_AMBULATORY_CARE_PROVIDER_SITE_OTHER): Payer: No Typology Code available for payment source | Admitting: Psychiatry

## 2021-02-27 ENCOUNTER — Other Ambulatory Visit: Payer: Self-pay

## 2021-02-27 ENCOUNTER — Encounter (HOSPITAL_COMMUNITY): Payer: Self-pay | Admitting: Psychiatry

## 2021-02-27 DIAGNOSIS — F319 Bipolar disorder, unspecified: Secondary | ICD-10-CM

## 2021-02-27 DIAGNOSIS — F411 Generalized anxiety disorder: Secondary | ICD-10-CM

## 2021-02-27 MED ORDER — LAMOTRIGINE ER 300 MG PO TB24: 1.0000 | ORAL_TABLET | ORAL | 0 refills | Status: DC

## 2021-02-27 NOTE — Progress Notes (Signed)
Virtual Visit via Telephone Note  I connected with Alex [name] on 02/27/21 at  2:40 PM EDT by telephone and verified that I am speaking with the correct person using two identifiers.  Location: Patient: Home Provider: Home Office   I discussed the limitations, risks, security and privacy concerns of performing an evaluation and management service by telephone and the availability of in person appointments. I also discussed with the patient that there may be a patient responsible charge related to this service. The patient expressed understanding and agreed to proceed.   History of Present Illness: Patient is evaluated by phone session.  He tried Klonopin for 2 days but did not like and back on Ativan.  He feels his anxiety is better.  He is sleeping good.  He had a COVID on July 4 weekend but recover very well.  Patient told he went to [place] to visit his daughter who recently had a baby.  He is now going to [name] end of this week and he has a daughter lives there and he is hoping to visit her 2.  Overall he feels things are going well.  He denies any irritability, anger, mania, psychosis.  His son is doing better and patient feels he is stable as he back to work.  His daughter still have a lot of issues but stable.  Patient denies any suicidal thoughts or homicidal thoughts.  His appetite is okay.  His weight is stable.  He is in therapy with [name].  Patient like to keep his current medicine which is Ambien 5 mg as needed at bedtime and occasionally he take extra 5 mg when he cannot sleep.  Continuing Ativan 0.5 mg up to 2 times a day as needed and Lamictal 300 mg daily.  He has no rash or any itching.  Recently he had a blood work.  His BUN is slightly high.    Past Psychiatric History: Reviewed. H/O depression, mood swing, anger and Inpatient [facility] due to suicidal thoughts but no attempt.  No h/o psychosis but h/o poor impulse control.  Tried Klonopin but did not work.  Took Seroquel,  Cymbalta and Remeron (increase depression)  Recent Results (from the past 2160 hour(s))  Testosterone     Status: None   Collection Time: 01/31/21  3:19 PM  Result Value Ref Range   Testosterone 729.42 300.00 - 890.00 ng/dL  Brain natriuretic peptide     Status: None   Collection Time: 01/31/21  3:19 PM  Result Value Ref Range   Pro B Natriuretic peptide (BNP) 41.0 0.0 - 100.0 pg/mL  Basic metabolic panel     Status: Abnormal   Collection Time: 01/31/21  3:19 PM  Result Value Ref Range   Sodium 134 (L) 135 - 145 mEq/L   Potassium 4.1 3.5 - 5.1 mEq/L   Chloride 101 96 - 112 mEq/L   CO2 24 19 - 32 mEq/L   Glucose, Bld 102 (H) 70 - 99 mg/dL   BUN 24 (H) 6 - 23 mg/dL   Creatinine, Ser 0.97 0.40 - 1.50 mg/dL   GFR 80.98 >60.00 mL/min    Comment: Calculated using the CKD-EPI Creatinine Equation (2021)   Calcium 9.0 8.4 - 10.5 mg/dL  T4, free     Status: None   Collection Time: 01/31/21  3:19 PM  Result Value Ref Range   Free T4 0.84 0.60 - 1.60 ng/dL    Comment: Specimens from patients who are undergoing biotin therapy and /or ingesting  biotin supplements may contain high levels of biotin.  The higher biotin concentration in these specimens interferes with this Free T4 assay.  Specimens that contain high levels  of biotin may cause false high results for this Free T4 assay.  Please interpret results in light of the total clinical presentation of the patient.    TSH     Status: None   Collection Time: 01/31/21  3:19 PM  Result Value Ref Range   TSH 2.09 0.35 - 4.50 uIU/mL    Psychiatric Specialty Exam: Physical Exam  Review of Systems  Weight 180 lb (81.6 kg).There is no height or weight on file to calculate BMI.  General Appearance: NA  Eye Contact:  NA  Speech:  Clear and Coherent  Volume:  Normal  Mood:  Euthymic  Affect:  NA  Thought Process:  Coherent  Orientation:  Full (Time, Place, and Person)  Thought Content:  WDL  Suicidal Thoughts:  No  Homicidal Thoughts:  No   Memory:  Immediate;   Good Recent;   Good Remote;   Good  Judgement:  Intact  Insight:  Present  Psychomotor Activity:  NA  Concentration:  Concentration: Good and Attention Span: Good  Recall:  Good  Fund of Knowledge:  Good  Language:  Good  Akathisia:  No  Handed:  Right  AIMS (if indicated):     Assets:  Communication Skills Desire for Improvement Housing Transportation  ADL's:  Intact  Cognition:  WNL  Sleep:   ok      Assessment and Plan: Bipolar disorder type I.  Generalized anxiety disorder.  Patient back on Ativan as could not tolerate Klonopin.  Does not want to change the medication since it is working well.  I reviewed blood work results.  Continue lamotrigine 300 mg daily, Ativan 0.5 mg up to 2 times a day and Ambien 5 mg and may repeat extra half if needed.  Patient will call for the refill of Ambien and Ativan when he needed.  Recommended to call us back with any question or any concern.  Follow-up in 3 months.  Follow Up Instructions:    I discussed the assessment and treatment plan with the patient. The patient was provided an opportunity to ask questions and all were answered. The patient agreed with the plan and demonstrated an understanding of the instructions.   The patient was advised to call back or seek an in-person evaluation if the symptoms worsen or if the condition fails to improve as anticipated.  I provided 19 minutes of non-face-to-face time during this encounter.   Kathlee Nations, MD

## 2021-02-28 ENCOUNTER — Telehealth (HOSPITAL_COMMUNITY): Payer: Self-pay | Admitting: *Deleted

## 2021-02-28 DIAGNOSIS — F411 Generalized anxiety disorder: Secondary | ICD-10-CM

## 2021-02-28 MED ORDER — ZOLPIDEM TARTRATE 10 MG PO TABS: ORAL_TABLET | ORAL | 0 refills | Status: DC

## 2021-02-28 NOTE — Telephone Encounter (Signed)
Done

## 2021-02-28 NOTE — Telephone Encounter (Signed)
Refaxed form to Dr. Patrice Paradise.Marland KitchenJohny Chess

## 2021-02-28 NOTE — Telephone Encounter (Signed)
Please re-fax form to Dr Towanda Malkin office

## 2021-02-28 NOTE — Telephone Encounter (Signed)
Pt called requesting refill of Ambien as discussed with you on last visit. Pt is going to [name], as noted in chart, and worries he may run out of medication. Next appointment scheduled for 05/31/21. Please review.

## 2021-02-28 NOTE — Progress Notes (Signed)
HPI: FU CAD. Cath in 2007 showed a 40 LAD; EF 50-55. Had normal ETT 7/11 with 10:00 on [place]. Echocardiogram December 2012 showed normal LV function and grade 1 diastolic dysfunction. Holter monitor in January 2014 showed sinus rhythm, PACs, PVCs and brief PAT. MRA December 2020 showed ascending thoracic aorta measuring 43 mm.  CTA 2/22 showed no TAA (3.9 cm). Since he was last seen, he has some dyspnea with more vigorous activities that he attributes to deconditioning.  No orthopnea, PND, pedal edema, chest pain or syncope.  Current Outpatient Medications  Medication Sig Dispense Refill   aspirin EC 81 MG tablet Take 1 tablet by mouth daily.     atorvastatin (LIPITOR) 40 MG tablet Take 1 tablet (40 mg total) by mouth daily. 90 tablet 3   AXIRON 30 MG/ACT SOLN Place 2 Act onto the skin daily.     Coenzyme Q10 (CO Q 10 PO) Take 1 capsule by mouth daily.      Immune Globulin, Human, 4 GM/20ML SOLN Inject into the skin once a week. wednesday     LamoTRIgine 300 MG TB24 24 hour tablet Take 1 tablet (300 mg total) by mouth every morning. 90 tablet 0   latanoprost (XALATAN) 0.005 % ophthalmic solution 1 DROP EACH EYE AT NIGHT     levothyroxine (SYNTHROID) 150 MCG tablet Take 1 tablet (150 mcg total) by mouth daily before breakfast. Annual appt due in July must see provider for future refills 90 tablet 0   LORazepam (ATIVAN) 0.5 MG tablet Take 1 tablet (0.5 mg total) by mouth 2 (two) times daily. 60 tablet 2   LUTEIN PO Take 1 tablet by mouth daily.      LYCOPENE PO Take by mouth.     meloxicam (MOBIC) 15 MG tablet TAKE 1 TABLET BY MOUTH DAILY AS NEEDED. 90 tablet 0   Multiple Vitamin (MULTI-VITAMINS) TABS Take by mouth.     OMEGA-3 KRILL OIL PO Take 1 capsule by mouth daily.      omeprazole (PRILOSEC) 20 MG capsule Take 20 mg by mouth every evening.     sildenafil (REVATIO) 20 MG tablet Take 1 tablet by mouth once daily 30 tablet 3   tadalafil (CIALIS) 20 MG tablet TAKE 1 TABLET BY MOUTH ONCE  DAILY AS NEEDED FOR ERECTILE DYSFUNCTION (Patient taking differently: 5 mg.) 30 tablet 2   tamsulosin (FLOMAX) 0.4 MG CAPS capsule TAKE 1 CAPSULE BY MOUTH EVERY DAY (Patient taking differently: as needed.) 90 capsule 1   telmisartan (MICARDIS) 80 MG tablet TAKE 1 TABLET BY MOUTH EVERY DAY 90 tablet 1   timolol (TIMOPTIC) 0.5 % ophthalmic solution timolol maleate 0.5 % eye drops  INSTILL 1 DROP INTO RIGHT EYE TWICE A DAY     zolpidem (AMBIEN) 10 MG tablet TAKE 1/2 (ONE-HALF) TABLET BY MOUTH AT BEDTIME AND  TAKE  OTHER  HALF  IF  NEEDED 30 tablet 0   No current facility-administered medications for this visit.     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   Cataract    LEFT EYE   Chronic back pain    Complication of anesthesia POST URINARY RETENTION---  2006 SHOULDER SURGERY MARKED BRADYCARDIA VAGAL RESPONSE NO ISSUE W/ SURGERY AFTER THIS ONE   WITH GENERAL ANESTHESIA, 15 YRS AGO VASOVAGAL REACTION NONE SINCE   Corneal hemorrhage 06/03/2018   Entire left eye   Coronary atherosclerosis CARDIOLOGIST- DR Jericka Kadar--  LAST VISIT 01-05-2012 IN EPIC   NON-OBSTRUCTIVE MILD DISEASE   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HCC)    Depression    Epicondylitis    right elbow   GERD (gastroesophageal reflux disease)    Glaucoma BOTH EYES   RIGHT EYE RADIATION DAMAGE   Hearing loss    BOTH EARS   Hearing loss    Bilateral   Hepatic cyst    Several, The  lesion of concern in segment 6 of the liver has single large portal vein and hepatic vein branches extending to tt, in a pattern of enhancement which mirrors these vascular structures. 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 9 CM X 4. 3 CM PER NOV 06-14-17  CHEST CTFOLOWED BY DR Stanford Breed YEARLY FOR   Positional vertigo    HX OF WITH SINUS INFECTIONS   Radial head fracture    Right   Squamous cell carcinoma of skin 06/04/2016   in situ-crown of scalp   Squamous cell carcinoma of skin 04/22/2017   in situ-crown scalp (txpbx)   Thoracic aortic aneurysm ([place]) 06/14/2017   last CT 4.1 CM Mild   Tinnitus    CONSTANT   Ulnar nerve compression    right elbow   Unsteady gait    especially with stairs, depth perception off   Urinary hesitancy    Wears glasses     Past Surgical History:  Procedure Laterality Date   CARDIAC CATHETERIZATION  01-16-2006  DR Marcello Moores WALL   MILD CORONARY ATHEROSCLEROSIS/ MID TO DISTAL LAD 40% STENOSIS/ LVF 50-55%   CARPAL TUNNEL RELEASE Right 11/03/2017   Procedure: RIGHT HAND CARPAL TUNNEL RELEASE;  Surgeon: Iran Planas, MD;  Location: [place];  Service: Orthopedics;  Laterality: Right;   CATARACT EXTRACTION Right    COLONSCOPY  2017 LAST DONE   MULTIPLE   ENDOSCOPY  LAST 2017   MULTIPLE DONE DILATION DONE ALSO   ESOPHAGOGASTRODUODENOSCOPY (EGD) WITH PROPOFOL N/A 02/24/2018  Procedure: ESOPHAGOGASTRODUODENOSCOPY (EGD) WITH PROPOFOL;  Surgeon: Laurence Spates, MD;  Location: [facility];  Service: Endoscopy;  Laterality: N/A;   EXCISION RADIAL HEAD Right 11/03/2017   Procedure: RIGHT PROXIMAL RADIUS RADIAL HEAD RESECTION AND JOINT DEBRIDEMENT;  Surgeon: Iran Planas, MD;  Location: [place];  Service: Orthopedics;  Laterality: Right;   EXTRACORPOREAL SHOCK WAVE LITHOTRIPSY Right 11/20/2020   Procedure: EXTRACORPOREAL SHOCK WAVE LITHOTRIPSY (ESWL);  Surgeon: Alexis Frock, MD;  [facility];  Service: Urology;  Laterality: Right;  75 MINS   KNEE ARTHROSCOPY  05/01/2012   Procedure: ARTHROSCOPY KNEE;  Surgeon: Johnn Hai, MD;  Location: [facility];  Service: Orthopedics;  Laterality: Left;  debridement and removal of loose body   LEFT ANKLE ARTHROSCOPY W/ DEBRIDEMENT  05-12-2007   LEFT HYDROCELECTOMY  03-29-2005   AND REPAIR LEFT INGUINAL HERNIA W/ MESH   MOHS SURGERY  2002   RIGHT ORBITAL CANCER   NASAL ENDOSCOPY  08-07-2005   RIGHT EPISTAXIS  / POST SEPTOPLASTY  (HX RIGHT ORBITAL CA & S/P RADIATION/ NECROSIS ANTERIOR END OF BOTH INFERIOR TURBINATES)   occuloplastic surgery  2002   PARS PLANA VITRECTOMY  11-06-2004   RIGHT EYE RADIATION RETINOPATHY W/ HEMORRHAGE   RADIAL HEAD ARTHROPLASTY Right 06/15/2018   Procedure: RIGHT ELBOW PROXIMAL RADIOULNAR JOINT DEBRIDEMENT AND ARTHROPLASTY;  Surgeon: Iran Planas, MD;  Location: [place];  Service: Orthopedics;  Laterality: Right;  BLOCK WITH SEDATION   REPAIR UNDESENDED RIGHT TESTICLE / RIGHT INGUINAL HERNIA  AGE 65   RIGHT ANKLE ARTHROSCOPY W/ EXTENSIVE DEBRIDEMENT  04/05/2008   x2    RIGHT SHOULDER SURGERY  2006   RIGHT SUPRAOMOHYOID NECK DISSECTION   03-08-2003   ZONES 1,2,3;   SUBMANDIBULAR MASS / METASTATIC SQUAMOUS CELL CARCINOMA RIGHT NECK   SAVORY DILATION N/A 02/24/2018   Procedure: SAVORY DILATION;  Surgeon: Laurence Spates, MD;  Location: [facility];  Service: Endoscopy;  Laterality: N/A;   SEPTOPLASTY  NOV 2006   SHOULDER ARTHROSCOPY Left    SHOULDER ARTHROSCOPY W/ SUBACROMIAL DECOMPRESSION AND DISTAL CLAVICLE EXCISION  10-09-2008   AND DEBRIDEMENT OF RIGHT SHOULDER IMPINGEMENT & [facility] JOINT ARTHRITIS   SPINE SURGERY  2016   l 3 TO l 4 PLATE AND SCREWS   TOTAL THYROIDECTOMY  11-03-2001   PAPILLARY THYROID CARCINOMA   TRANSTHORACIC ECHOCARDIOGRAM  12/ 2012   grade I diastolic dysfunction/ ef 94-49%   ULNAR NERVE TRANSPOSITION Right 04/28/2014   Procedure: RIGHT ELBOW ULNA NERVE RELEASE TRANSPOSTION AND MEDIAL EPICONDYLAR DEBRIDEMENT AND REPAIR;  Surgeon: Linna Hoff, MD;  Location: [place];  Service: Orthopedics;  Laterality: Right;    Social History   Socioeconomic History   Marital status: Married    Spouse name: Not on file   Number of children: Not on file   Years of education: Not on file   Highest education level: Not on file  Occupational History   Not on file  Tobacco Use   Smoking status: Never   Smokeless tobacco: Never  Vaping Use   Vaping Use: Never used  Substance and Sexual Activity   Alcohol use: Yes    Alcohol/week: 0.0 standard drinks    Comment: 1-2 glasses per month   Drug use: No   Sexual activity: Yes    Partners: Female    Birth control/protection: None  Other Topics Concern   Not on file  Social History Narrative   Originally from [place]. Moved to [place] in 1984.  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 No international travel other than [place]. Has a dog at home, poodle  mix. His daughter who lives with him now has a dog. No mold exposure. No bird exposure. No hot tub exposure. Enjoys gardening.    Social Determinants of Health   Financial Resource Strain: Not on file  Food Insecurity: Not on file  Transportation Needs: Not on file  Physical Activity: Not on file  Stress: Not on file  Social Connections: Not on file  Intimate Partner Violence: Not on file    Family History  Problem Relation Age of Onset   Depression Sister    Rectal cancer Sister    Lung cancer Brother    Kidney disease Mother    Depression Mother    Stroke Father    COPD Father    Hypertension Father    Prostate cancer Father    Depression Daughter    Drug abuse Daughter    Psychiatric Illness Son     ROS: Back pain but no fevers or chills, productive cough, hemoptysis, dysphasia, odynophagia, melena, hematochezia, dysuria, hematuria, rash, seizure activity, orthopnea, PND, pedal edema, claudication. Remaining systems are negative.  Physical Exam: Well-developed well-nourished in no acute distress.  Skin is warm and dry.  HEENT is normal.  Neck is supple.  Chest is clear to auscultation with normal expansion.  Cardiovascular exam is regular rate and rhythm.  Abdominal exam nontender or distended. No masses palpated. Extremities show no edema. neuro grossly intact  ECG-sinus rhythm at a rate of 78, no ST changes.  Personally reviewed  A/P  Coronary artery disease-no CP; continue ASA and statin. Thoracic aortic aneurysm-Aortic root mildly enlarged on most recent CTA; followed by Dr Cyndia Bent. Hypertension-blood pressure elevated; I have asked him to take his amlodipine daily (he is taking this 3 times weekly average).  Follow blood pressure and adjust medications as needed. Hyperlipidemia-continue statin.  Last LDL 79.  Increase Lipitor to 80 mg daily.  Check lipids and liver in 12 weeks. History of right portal vein shunt-followed at [facility].  [name],  MD

## 2021-03-01 ENCOUNTER — Encounter (HOSPITAL_COMMUNITY): Payer: Self-pay | Admitting: Licensed Clinical Social Worker

## 2021-03-01 ENCOUNTER — Ambulatory Visit (INDEPENDENT_AMBULATORY_CARE_PROVIDER_SITE_OTHER): Payer: No Typology Code available for payment source | Admitting: Licensed Clinical Social Worker

## 2021-03-01 DIAGNOSIS — F319 Bipolar disorder, unspecified: Secondary | ICD-10-CM

## 2021-03-01 DIAGNOSIS — F411 Generalized anxiety disorder: Secondary | ICD-10-CM | POA: Diagnosis not present

## 2021-03-01 NOTE — Progress Notes (Signed)
Virtual virtual video Note  I connected with Alex [name] on 03/01/2021 at 10:00 am EST by phone-enabled virtual visit. I verified that I am speaking with the correct person using  two identifiers.I discussed the limitations of evaluation and management by telemedicine and the availability of in person appointments. The patient expressed understanding and agreed to proceed.    LOCATION: Patient:Home Provider: Home office  History of Present Illness:  Pt was referred by Dr. Adele Schilder for OP therapy for bipolar disorder and anxiety.    Observations/Objective: Patient presented for today's session on time and was alert, oriented x5, with no evidence or self-report of SI/HI or A/V H.  Patient reported ongoing compliance with medication and denied any use of alcohol or illicit substances.  Clinician inquired about patient's current emotional ratings, as well as any significant changes in thoughts, feelings or behavior since previous session.  Patient reported scores of 6/10 for depression, 6/10 for anxiety, 6/10 for anger/irritability. Pt presents irritated for his session: "I'm going to have back surgery due to my continued back pain." [name] used socratic questions. Pt is having negative thoughts about his future financially and health issues, which are interfering with his sleep. [name] used DBT to assist pt in challenging his negative thoughts, cognitive distortions and use mindfulness skills to be focused on the present.       Plan: Codependent No More., "letting go, fair fighting rules  Follow Up Instructions:  I discussed the assessment and treatment plan with the patient. The patient was provided an opportunity to ask questions and all were answered. The patient agreed with the plan and demonstrated an understanding of the instructions.   The patient was advised to call back or seek an in-person evaluation if the symptoms worsen or if the condition fails to improve as anticipated.  I  provided 60 minutes of non-face-to-face time during this encounter.   Demesha Boorman S, LCAS

## 2021-03-02 ENCOUNTER — Encounter: Payer: Self-pay | Admitting: Cardiology

## 2021-03-02 ENCOUNTER — Other Ambulatory Visit: Payer: Self-pay

## 2021-03-02 ENCOUNTER — Ambulatory Visit: Payer: No Typology Code available for payment source | Admitting: Cardiology

## 2021-03-02 VITALS — BP 136/98 | HR 78 | Ht 72.0 in | Wt 181.0 lb

## 2021-03-02 DIAGNOSIS — I2510 Atherosclerotic heart disease of native coronary artery without angina pectoris: Secondary | ICD-10-CM | POA: Diagnosis not present

## 2021-03-02 DIAGNOSIS — E7800 Pure hypercholesterolemia, unspecified: Secondary | ICD-10-CM

## 2021-03-02 DIAGNOSIS — I712 Thoracic aortic aneurysm, without rupture, unspecified: Secondary | ICD-10-CM

## 2021-03-02 DIAGNOSIS — I10 Essential (primary) hypertension: Secondary | ICD-10-CM

## 2021-03-02 MED ORDER — AMLODIPINE BESYLATE 10 MG PO TABS: 10.0000 mg | ORAL_TABLET | Freq: Every day | ORAL | 3 refills | Status: DC

## 2021-03-02 MED ORDER — ATORVASTATIN CALCIUM 80 MG PO TABS: 80.0000 mg | ORAL_TABLET | Freq: Every day | ORAL | 3 refills | Status: DC

## 2021-03-02 NOTE — Patient Instructions (Signed)
Medication Instructions:   TAKE AMLODIPINE 10 MG ONCE DAILY  INCREASE ATORVASTATIN TO 80 MG ONCE DAILY= 2 OF THE 40 MG TABLETS ONCE DAILY  *If you need a refill on your cardiac medications before your next appointment, please call your pharmacy*   Lab Work:  Your physician recommends that you return for lab work in: 3 MONTHS-FASTING  If you have labs (blood work) drawn today and your tests are completely normal, you will receive your results only by: [place] (if you have MyChart) OR A paper copy in the mail If you have any lab test that is abnormal or we need to change your treatment, we will call you to review the results.  Follow-Up: At [facility], you and your health needs are our priority.  As part of our continuing mission to provide you with exceptional heart care, we have created designated Provider Care Teams.  These Care Teams include your primary Cardiologist (physician) and Advanced Practice Providers (APPs -  Physician Assistants and Nurse Practitioners) who all work together to provide you with the care you need, when you need it.  We recommend signing up for the patient portal called "MyChart".  Sign up information is provided on this After Visit Summary.  MyChart is used to connect with patients for Virtual Visits (Telemedicine).  Patients are able to view lab/test results, encounter notes, upcoming appointments, etc.  Non-urgent messages can be sent to your provider as well.   To learn more about what you can do with MyChart, go to [email or web address].    Your next appointment:   6 month(s)  The format for your next appointment:   In Person  Provider:   [name], MD

## 2021-03-07 ENCOUNTER — Ambulatory Visit: Payer: No Typology Code available for payment source | Admitting: Dermatology

## 2021-03-13 ENCOUNTER — Encounter (HOSPITAL_COMMUNITY): Payer: Self-pay | Admitting: Licensed Clinical Social Worker

## 2021-03-13 ENCOUNTER — Ambulatory Visit (HOSPITAL_COMMUNITY): Payer: No Typology Code available for payment source | Admitting: Licensed Clinical Social Worker

## 2021-03-13 ENCOUNTER — Ambulatory Visit (INDEPENDENT_AMBULATORY_CARE_PROVIDER_SITE_OTHER): Payer: No Typology Code available for payment source | Admitting: Licensed Clinical Social Worker

## 2021-03-13 ENCOUNTER — Other Ambulatory Visit: Payer: Self-pay

## 2021-03-13 DIAGNOSIS — F319 Bipolar disorder, unspecified: Secondary | ICD-10-CM

## 2021-03-13 DIAGNOSIS — F411 Generalized anxiety disorder: Secondary | ICD-10-CM | POA: Diagnosis not present

## 2021-03-13 NOTE — Progress Notes (Signed)
Virtual virtual Phone Note  I connected with Alex [name] on 03/13/2021 at 3:00 am EST by phone-enabled virtual visit. I verified that I am speaking with the correct person using  two identifiers.I discussed the limitations of evaluation and management by telemedicine and the availability of in person appointments. The patient expressed understanding and agreed to proceed.    LOCATION: Patient:Home Provider: Home office  History of Present Illness:  Pt was referred by Dr. Adele Schilder for OP therapy for bipolar disorder and anxiety.    Observations/Objective: Patient presented for today's session on time and was alert, oriented x5, with no evidence or self-report of SI/HI or A/V H.  Patient reported ongoing compliance with medication and denied any use of alcohol or illicit substances.  Clinician inquired about patient's current emotional ratings, as well as any significant changes in thoughts, feelings or behavior since previous session.  Patient reported scores of 8/10 for depression, 8/10 for anxiety, 6/10 for anger/irritability. Pt is just returning from a trip to [name], visiting daughter and her family. "I'm going to have back surgery due to my continued back pain in October." [name] used socratic questions. Pt continues to ruminate about his health, future and quality of life. [name] redirects patient, explores solutions, role-plays scenarios, and discusses plan of care after surgery.   Plan: Codependent No More., "letting go, fair fighting rules  Follow Up Instructions:  I discussed the assessment and treatment plan with the patient. The patient was provided an opportunity to ask questions and all were answered. The patient agreed with the plan and demonstrated an understanding of the instructions.   The patient was advised to call back or seek an in-person evaluation if the symptoms worsen or if the condition fails to improve as anticipated.  I provided 45 minutes of non-face-to-face time  during this encounter.   Dona Walby S, LCAS

## 2021-03-14 NOTE — Addendum Note (Signed)
Addended by: Ricci Barker on: 03/14/2021 02:36 PM   Modules accepted: Orders

## 2021-03-16 ENCOUNTER — Telehealth: Payer: Self-pay | Admitting: *Deleted

## 2021-03-16 DIAGNOSIS — I2510 Atherosclerotic heart disease of native coronary artery without angina pectoris: Secondary | ICD-10-CM

## 2021-03-16 DIAGNOSIS — E7800 Pure hypercholesterolemia, unspecified: Secondary | ICD-10-CM

## 2021-03-16 MED ORDER — ATORVASTATIN CALCIUM 40 MG PO TABS: 40.0000 mg | ORAL_TABLET | Freq: Every day | ORAL | 3 refills | Status: DC

## 2021-03-16 NOTE — Telephone Encounter (Signed)
Patient walked into the office to discuss issues he was having with the increase in the atorvastatin. He reports increased muscle and joint pain and also diarrhea. He did not have any problems on the 40 mg tablets. He will go back to the 40 mg once daily.

## 2021-03-18 ENCOUNTER — Other Ambulatory Visit: Payer: Self-pay | Admitting: Internal Medicine

## 2021-03-18 ENCOUNTER — Other Ambulatory Visit (HOSPITAL_COMMUNITY): Payer: Self-pay | Admitting: Psychiatry

## 2021-03-18 DIAGNOSIS — F319 Bipolar disorder, unspecified: Secondary | ICD-10-CM

## 2021-03-18 DIAGNOSIS — F411 Generalized anxiety disorder: Secondary | ICD-10-CM

## 2021-03-20 ENCOUNTER — Other Ambulatory Visit (HOSPITAL_COMMUNITY): Payer: Self-pay | Admitting: *Deleted

## 2021-03-20 ENCOUNTER — Telehealth (HOSPITAL_COMMUNITY): Payer: Self-pay | Admitting: *Deleted

## 2021-03-20 DIAGNOSIS — F319 Bipolar disorder, unspecified: Secondary | ICD-10-CM

## 2021-03-20 DIAGNOSIS — F411 Generalized anxiety disorder: Secondary | ICD-10-CM

## 2021-03-20 MED ORDER — LORAZEPAM 0.5 MG PO TABS: 0.5000 mg | ORAL_TABLET | Freq: Two times a day (BID) | ORAL | 1 refills | Status: DC

## 2021-03-20 NOTE — Telephone Encounter (Signed)
Pt called requesting a refill of Ativan 0.5 mg last sent in on 09/05/20 with 2 fills. Pt next appointment on 05/31/21. Ok to reorder same?

## 2021-03-20 NOTE — Telephone Encounter (Signed)
Yes please refill the Ativan with 1 additional refill.

## 2021-03-21 ENCOUNTER — Other Ambulatory Visit: Payer: Self-pay | Admitting: Orthopaedic Surgery

## 2021-03-21 DIAGNOSIS — M4722 Other spondylosis with radiculopathy, cervical region: Secondary | ICD-10-CM

## 2021-03-27 ENCOUNTER — Ambulatory Visit (INDEPENDENT_AMBULATORY_CARE_PROVIDER_SITE_OTHER): Payer: No Typology Code available for payment source | Admitting: Licensed Clinical Social Worker

## 2021-03-27 ENCOUNTER — Other Ambulatory Visit: Payer: Self-pay

## 2021-03-27 ENCOUNTER — Encounter (HOSPITAL_COMMUNITY): Payer: Self-pay | Admitting: Licensed Clinical Social Worker

## 2021-03-27 DIAGNOSIS — F319 Bipolar disorder, unspecified: Secondary | ICD-10-CM | POA: Diagnosis not present

## 2021-03-27 NOTE — Progress Notes (Signed)
Virtual virtual Phone Note  I connected with Alex [name] on 03/27/2021 at 2:00 pm EST by phone-enabled virtual visit. I verified that I am speaking with the correct person using  two identifiers.I discussed the limitations of evaluation and management by telemedicine and the availability of in person appointments. The patient expressed understanding and agreed to proceed.    LOCATION: Patient:Home Provider: Home office  History of Present Illness:  Pt was referred by Dr. Adele Schilder for OP therapy for bipolar disorder and anxiety.    Observations/Objective: Patient presented for today's session on time and was alert, oriented x5, with no evidence or self-report of SI/HI or A/V H.  Patient reported ongoing compliance with medication and denied any use of alcohol or illicit substances.  Clinician inquired about patient's current emotional ratings, as well as any significant changes in thoughts, feelings or behavior since previous session.  Patient reported scores of 8/10 for depression, 8/10 for anxiety, 6/10 for anger/irritability. Pt discusses his physical and emotional pain. Pt continues to ruminate about his health, future and quality of life. [name] redirects patient, explores solutions, and role-played scenarios and coping skills.   Plan: Codependent No More  Follow Up Instructions:  I discussed the assessment and treatment plan with the patient. The patient was provided an opportunity to ask questions and all were answered. The patient agreed with the plan and demonstrated an understanding of the instructions.   The patient was advised to call back or seek an in-person evaluation if the symptoms worsen or if the condition fails to improve as anticipated.  I provided 60 minutes of non-face-to-face time during this encounter.   Mariabelen Pressly S, LCAS

## 2021-03-28 ENCOUNTER — Ambulatory Visit
Admission: RE | Admit: 2021-03-28 | Discharge: 2021-03-28 | Disposition: A | Discharge status: Home / Self Care | Payer: No Typology Code available for payment source | Source: Ambulatory Visit | Attending: Orthopaedic Surgery | Admitting: Orthopaedic Surgery

## 2021-03-28 ENCOUNTER — Other Ambulatory Visit: Payer: Self-pay

## 2021-03-28 DIAGNOSIS — Z8589 Personal history of malignant neoplasm of other organs and systems: Secondary | ICD-10-CM | POA: Insufficient documentation

## 2021-03-28 DIAGNOSIS — M4722 Other spondylosis with radiculopathy, cervical region: Secondary | ICD-10-CM

## 2021-03-28 DIAGNOSIS — R600 Localized edema: Secondary | ICD-10-CM | POA: Insufficient documentation

## 2021-04-02 ENCOUNTER — Other Ambulatory Visit (HOSPITAL_COMMUNITY): Payer: Self-pay | Admitting: Psychiatry

## 2021-04-02 DIAGNOSIS — F411 Generalized anxiety disorder: Secondary | ICD-10-CM

## 2021-04-04 ENCOUNTER — Ambulatory Visit (HOSPITAL_COMMUNITY): Payer: No Typology Code available for payment source | Admitting: Licensed Clinical Social Worker

## 2021-04-04 ENCOUNTER — Other Ambulatory Visit: Payer: Self-pay

## 2021-04-04 ENCOUNTER — Ambulatory Visit (INDEPENDENT_AMBULATORY_CARE_PROVIDER_SITE_OTHER): Payer: No Typology Code available for payment source | Admitting: Licensed Clinical Social Worker

## 2021-04-04 ENCOUNTER — Encounter (HOSPITAL_COMMUNITY): Payer: Self-pay | Admitting: Licensed Clinical Social Worker

## 2021-04-04 DIAGNOSIS — F319 Bipolar disorder, unspecified: Secondary | ICD-10-CM | POA: Diagnosis not present

## 2021-04-04 NOTE — Progress Notes (Addendum)
Virtual virtual Phone Note  I connected with Alex [name] on 03/27/2021 at 2:00 pm EST by phone-enabled virtual visit. I verified that I am speaking with the correct person using  two identifiers.I discussed the limitations of evaluation and management by telemedicine and the availability of in person appointments. The patient expressed understanding and agreed to proceed.    LOCATION: Patient:Home Provider: Home office  History of Present Illness:  Pt was referred by Dr. Adele Schilder for OP therapy for bipolar disorder and anxiety.    Observations/Objective: Patient presented for today's session on time and was alert, oriented x5, with no evidence or self-report of SI/HI or A/V H.  Patient reported ongoing compliance with medication and denied any use of alcohol or illicit substances.  Clinician inquired about patient's current emotional ratings, as well as any significant changes in thoughts, feelings or behavior since previous session.  Patient reported scores of 8/10 for depression, 8/10 for anxiety, 7/10 for anger/irritability. Pt discusses his physical and emotional pain and coping skills used. [name] reviewed tx plan with pt who verbalized acceptance of the plan.Alex [name] and pt explored his current continued stressors: adult children who still live at home, health issues, retirement plans. [name] provided education, teaching pt how to practice self-awareness which can help pt recognize patterns in his emotions, including events or situations that can trigger worsened symptoms. [name] provided education on effective coping skills, practiced in session.  Plan: Codependent No More  Follow Up Instructions:  I discussed the assessment and treatment plan with the patient. The patient was provided an opportunity to ask questions and all were answered. The patient agreed with the plan and demonstrated an understanding of the instructions.   The patient was advised to call back or seek an in-person evaluation if  the symptoms worsen or if the condition fails to improve as anticipated.  I provided 60 minutes of non-face-to-face time during this encounter.   Merranda Bolls S, LCAS

## 2021-04-08 ENCOUNTER — Other Ambulatory Visit: Payer: Self-pay | Admitting: Internal Medicine

## 2021-04-09 ENCOUNTER — Other Ambulatory Visit: Payer: Self-pay

## 2021-04-09 ENCOUNTER — Encounter (HOSPITAL_COMMUNITY): Payer: Self-pay | Admitting: Licensed Clinical Social Worker

## 2021-04-09 ENCOUNTER — Ambulatory Visit (INDEPENDENT_AMBULATORY_CARE_PROVIDER_SITE_OTHER): Payer: No Typology Code available for payment source | Admitting: Licensed Clinical Social Worker

## 2021-04-09 DIAGNOSIS — F319 Bipolar disorder, unspecified: Secondary | ICD-10-CM | POA: Diagnosis not present

## 2021-04-09 NOTE — Progress Notes (Signed)
Virtual virtual Phone Note  I connected with Alex [name] on 04/09/2021 at 3:00 pm EST by phone-enabled virtual visit. I verified that I am speaking with the correct person using  two identifiers.I discussed the limitations of evaluation and management by telemedicine and the availability of in person appointments. The patient expressed understanding and agreed to proceed.    LOCATION: Patient: Home Provider: Home office  History of Present Illness:  Pt was referred by Dr. Adele Schilder for OP therapy for bipolar disorder and anxiety.    Observations/Objective: Patient presented for today's session on time and was alert, oriented x5, with no evidence or self-report of SI/HI or A/V H.  Patient reported ongoing compliance with medication and denied any use of alcohol or illicit substances.  Clinician inquired about patient's current emotional ratings, as well as any significant changes in thoughts, feelings or behavior since previous session.  Patient reported scores of 8/10 for depression, 8/10 for anxiety, 7/10 for anger/irritability. Pt discusses his physical and emotional pain and coping skills used. [name] and pt explored his current continued stressors: adult children who still live at home, health issues, retirement plans. Pt reports he has an upcoming back surgery where it should help with his chronic pain. Pt reports he has concerns about how stress is affecting his mental and physical health. [name] provided psychoeducation on stress triggers, effects, and coping skills. [name] assigned pt homework: research stress effects and read chapter in book.   Plan: Codependent No More  Follow Up Instructions:  I discussed the assessment and treatment plan with the patient. The patient was provided an opportunity to ask questions and all were answered. The patient agreed with the plan and demonstrated an understanding of the instructions.   The patient was advised to call back or seek an in-person evaluation  if the symptoms worsen or if the condition fails to improve as anticipated.  I provided 60 minutes of non-face-to-face time during this encounter.   Danyle Boening S, LCAS

## 2021-04-10 ENCOUNTER — Ambulatory Visit (HOSPITAL_COMMUNITY): Payer: No Typology Code available for payment source | Admitting: Licensed Clinical Social Worker

## 2021-04-17 ENCOUNTER — Ambulatory Visit (INDEPENDENT_AMBULATORY_CARE_PROVIDER_SITE_OTHER): Payer: No Typology Code available for payment source | Admitting: Licensed Clinical Social Worker

## 2021-04-17 ENCOUNTER — Other Ambulatory Visit: Payer: Self-pay

## 2021-04-17 ENCOUNTER — Encounter (HOSPITAL_COMMUNITY): Payer: Self-pay | Admitting: Licensed Clinical Social Worker

## 2021-04-17 DIAGNOSIS — F319 Bipolar disorder, unspecified: Secondary | ICD-10-CM | POA: Diagnosis not present

## 2021-04-17 NOTE — Progress Notes (Signed)
Virtual virtual Phone Note  I connected with Alex [name] on 04/17/2021 at 3:00 pm EST by phone-enabled virtual visit. I verified that I am speaking with the correct person using  two identifiers.I discussed the limitations of evaluation and management by telemedicine and the availability of in person appointments. The patient expressed understanding and agreed to proceed.    LOCATION: Patient: Home Provider: Home office  History of Present Illness:  Pt was referred by Dr. Adele Schilder for OP therapy for bipolar disorder and anxiety.    Observations/Objective: Patient presented for today'[name] session on time and was alert, oriented x5, with no evidence or self-report of SI/HI or A/V H.  Patient reported ongoing compliance with medication and denied any use of alcohol or illicit substances.  Clinician inquired about patient'[name] current emotional ratings, as well as any significant changes in thoughts, feelings or behavior since previous session.  Patient reported scores of 8/10 for depression, 8/10 for anxiety, 7/10 or anger/irritability. Pt discusses his physical and emotional pain and coping skills used. [name] and pt explored his continued stressors: adult children who still live at home, health issues, retirement plans. Pt reports he has an upcoming back surgery where it should help with his chronic pain.  Pt expressed concerns about the surgery after resesearching it on the internet. [name] suggested pt discussed his concerns with his surgeon and alternatives to surgery. Pt reports he researched how stress is affecting his mental and physical health. [name] used CBT to stop his negative cycles such as these by breaking down things that make him feel bad, anxious or scared, helping to make his problems more manageable, changing his negative thought patterns.  Plan: Codependent No More  Follow Up Instructions:  I discussed the assessment and treatment plan with the patient. The patient was provided an opportunity  to ask questions and all were answered. The patient agreed with the plan and demonstrated an understanding of the instructions.   The patient was advised to call back or seek an in-person evaluation if the symptoms worsen or if the condition fails to improve as anticipated.  I provided 60 minutes of non-face-to-face time during this encounter.   Alex [name], LCAS

## 2021-04-19 ENCOUNTER — Telehealth: Payer: Self-pay | Admitting: Internal Medicine

## 2021-04-19 DIAGNOSIS — E291 Testicular hypofunction: Secondary | ICD-10-CM

## 2021-04-19 DIAGNOSIS — R5382 Chronic fatigue, unspecified: Secondary | ICD-10-CM

## 2021-04-19 NOTE — Telephone Encounter (Signed)
Will hold until MD return for response.Marland KitchenJohny [name]

## 2021-04-19 NOTE — Telephone Encounter (Signed)
Seeking advice for ringing in ears x2 weeks. Patient would like to have  labs to test testosterone and thyroid  Seeking advice Advised provider out of office

## 2021-04-23 ENCOUNTER — Ambulatory Visit (INDEPENDENT_AMBULATORY_CARE_PROVIDER_SITE_OTHER): Payer: No Typology Code available for payment source | Admitting: Dermatology

## 2021-04-23 ENCOUNTER — Encounter: Payer: No Typology Code available for payment source | Admitting: Internal Medicine

## 2021-04-23 ENCOUNTER — Other Ambulatory Visit: Payer: Self-pay

## 2021-04-23 DIAGNOSIS — L570 Actinic keratosis: Secondary | ICD-10-CM

## 2021-04-23 DIAGNOSIS — D485 Neoplasm of uncertain behavior of skin: Secondary | ICD-10-CM

## 2021-04-23 DIAGNOSIS — D044 Carcinoma in situ of skin of scalp and neck: Secondary | ICD-10-CM

## 2021-04-23 NOTE — Patient Instructions (Signed)

## 2021-04-23 NOTE — Telephone Encounter (Signed)
Called pt there was no answer LMOM MD ok labs. Pt can go to [facility] to have done 2-3 days prior to appt.Marland KitchenJohny Chess

## 2021-04-23 NOTE — Telephone Encounter (Signed)
OK - done Thx 

## 2021-04-24 ENCOUNTER — Ambulatory Visit (HOSPITAL_COMMUNITY): Payer: No Typology Code available for payment source | Admitting: Licensed Clinical Social Worker

## 2021-04-24 ENCOUNTER — Ambulatory Visit (INDEPENDENT_AMBULATORY_CARE_PROVIDER_SITE_OTHER): Payer: No Typology Code available for payment source | Admitting: Internal Medicine

## 2021-04-24 DIAGNOSIS — H9313 Tinnitus, bilateral: Secondary | ICD-10-CM

## 2021-04-24 DIAGNOSIS — R5382 Chronic fatigue, unspecified: Secondary | ICD-10-CM

## 2021-04-24 DIAGNOSIS — G8929 Other chronic pain: Secondary | ICD-10-CM

## 2021-04-24 DIAGNOSIS — E291 Testicular hypofunction: Secondary | ICD-10-CM | POA: Diagnosis not present

## 2021-04-24 DIAGNOSIS — M353 Polymyalgia rheumatica: Secondary | ICD-10-CM

## 2021-04-24 DIAGNOSIS — M5442 Lumbago with sciatica, left side: Secondary | ICD-10-CM | POA: Diagnosis not present

## 2021-04-24 DIAGNOSIS — M5441 Lumbago with sciatica, right side: Secondary | ICD-10-CM

## 2021-04-24 LAB — VITAMIN B12: Vitamin B-12: 1447 pg/mL — ABNORMAL HIGH (ref 211–911)

## 2021-04-24 LAB — COMPREHENSIVE METABOLIC PANEL
ALT: 33 U/L (ref 0–53)
AST: 29 U/L (ref 0–37)
Albumin: 4.1 g/dL (ref 3.5–5.2)
Alkaline Phosphatase: 75 U/L (ref 39–117)
BUN: 13 mg/dL (ref 6–23)
CO2: 28 mEq/L (ref 19–32)
Calcium: 9.1 mg/dL (ref 8.4–10.5)
Chloride: 100 mEq/L (ref 96–112)
Creatinine, Ser: 1.1 mg/dL (ref 0.40–1.50)
GFR: 69.52 mL/min (ref >60.00)
Glucose, Bld: 113 mg/dL — ABNORMAL HIGH (ref 70–99)
Potassium: 4.4 mEq/L (ref 3.5–5.1)
Sodium: 136 mEq/L (ref 135–145)
Total Bilirubin: 0.9 mg/dL (ref 0.2–1.2)
Total Protein: 6.5 g/dL (ref 6.0–8.3)

## 2021-04-24 LAB — CBC WITH DIFFERENTIAL/PLATELET
Basophils Absolute: 0.1 10*3/uL (ref 0.0–0.1)
Basophils Relative: 0.9 % (ref 0.0–3.0)
Eosinophils Absolute: 0.3 10*3/uL (ref 0.0–0.7)
Eosinophils Relative: 2.8 % (ref 0.0–5.0)
HCT: 51.5 % (ref 39.0–52.0)
Hemoglobin: 16.8 g/dL (ref 13.0–17.0)
Lymphocytes Relative: 11.9 % — ABNORMAL LOW (ref 12.0–46.0)
Lymphs Abs: 1.2 10*3/uL (ref 0.7–4.0)
MCHC: 32.6 g/dL (ref 30.0–36.0)
MCV: 81.9 fl (ref 78.0–100.0)
Monocytes Absolute: 1.2 10*3/uL — ABNORMAL HIGH (ref 0.1–1.0)
Monocytes Relative: 12.7 % — ABNORMAL HIGH (ref 3.0–12.0)
Neutro Abs: 7 10*3/uL (ref 1.4–7.7)
Neutrophils Relative %: 71.7 % (ref 43.0–77.0)
Platelets: 262 10*3/uL (ref 150.0–400.0)
RBC: 6.29 Mil/uL — ABNORMAL HIGH (ref 4.22–5.81)
RDW: 15.6 % — ABNORMAL HIGH (ref 11.5–15.5)
WBC: 9.8 10*3/uL (ref 4.0–10.5)

## 2021-04-24 LAB — T4, FREE: Free T4: 0.9 ng/dL (ref 0.60–1.60)

## 2021-04-24 LAB — TSH: TSH: 3.84 u[IU]/mL (ref 0.35–5.50)

## 2021-04-24 LAB — TESTOSTERONE: Testosterone: 643.77 ng/dL (ref 300.00–890.00)

## 2021-04-24 LAB — CORTISOL: Cortisol, Plasma: 10.5 ug/dL

## 2021-04-24 MED ORDER — GABAPENTIN 100 MG PO CAPS: 100.0000 mg | ORAL_CAPSULE | Freq: Two times a day (BID) | ORAL | 3 refills | Status: DC | PRN

## 2021-04-24 MED ORDER — AMOXICILLIN-POT CLAVULANATE 875-125 MG PO TABS: 1.0000 | ORAL_TABLET | Freq: Two times a day (BID) | ORAL | 0 refills | Status: DC

## 2021-04-24 NOTE — Assessment & Plan Note (Signed)
?  etiology Will check CBC, iron, TSH, CMET, cortisol

## 2021-04-24 NOTE — Assessment & Plan Note (Signed)
Off steroids

## 2021-04-24 NOTE — Assessment & Plan Note (Signed)
worse B: try Gabapentin 100-200 or 300 mg bid LBP - fusion is planned for Oct 10 - Dr Patrice Paradise

## 2021-04-24 NOTE — Addendum Note (Signed)
Addended by: Boris Lown B on: 04/24/2021 03:18 PM   Modules accepted: Orders

## 2021-04-24 NOTE — Progress Notes (Signed)
Subjective:  Patient ID: Alex [name], male    DOB: 05-29-1956  Age: 65 y.o. MRN: [id number]  CC: Follow-up (Pt states he continue to have the ringing in his ears.. been short winded and fatigue) and Hypertension (Also want to discuss BP he states its been fluctuating.[name] last night systolic 0000000  took amlodipine)   HPI Everth Lapine Miyazaki presents for LBP - fusion is planned for Oct 10 - Dr Patrice Paradise C/o tinnitus - worse; very loud - can't sleep... [name] is seeing ENT  Outpatient Medications Prior to Visit  Medication Sig Dispense Refill   amLODipine (NORVASC) 10 MG tablet Take 1 tablet (10 mg total) by mouth daily. 90 tablet 3   atorvastatin (LIPITOR) 40 MG tablet Take 1 tablet (40 mg total) by mouth daily. 90 tablet 3   AXIRON 30 MG/ACT SOLN Place 2 Act onto the skin daily.     Coenzyme Q10 (CO Q 10 PO) Take 1 capsule by mouth daily.      Immune Globulin, Human, 4 GM/20ML SOLN Inject into the skin once a week. wednesday     LamoTRIgine 300 MG TB24 24 hour tablet Take 1 tablet (300 mg total) by mouth every morning. 90 tablet 0   latanoprost (XALATAN) 0.005 % ophthalmic solution 1 DROP EACH EYE AT NIGHT     levothyroxine (SYNTHROID) 150 MCG tablet Take 1 tablet (150 mcg total) by mouth daily before breakfast. Annual appt due in July must see provider for future refills 90 tablet 0   LORazepam (ATIVAN) 0.5 MG tablet Take 1 tablet (0.5 mg total) by mouth 2 (two) times daily. 60 tablet 1   LUTEIN PO Take 1 tablet by mouth daily.      LYCOPENE PO Take by mouth.     meloxicam (MOBIC) 15 MG tablet TAKE 1 TABLET BY MOUTH EVERY DAY AS NEEDED 90 tablet 0   Multiple Vitamin (MULTI-VITAMINS) TABS Take by mouth.     OMEGA-3 KRILL OIL PO Take 1 capsule by mouth daily.      omeprazole (PRILOSEC) 20 MG capsule Take 20 mg by mouth every evening.     sildenafil (REVATIO) 20 MG tablet Take 1 tablet by mouth once daily 30 tablet 3   tadalafil (CIALIS) 20 MG tablet TAKE 1 TABLET BY MOUTH ONCE DAILY AS NEEDED FOR  ERECTILE DYSFUNCTION (Patient taking differently: 5 mg.) 30 tablet 2   tamsulosin (FLOMAX) 0.4 MG CAPS capsule TAKE 1 CAPSULE BY MOUTH EVERY DAY (Patient taking differently: as needed.) 90 capsule 1   telmisartan (MICARDIS) 80 MG tablet TAKE 1 TABLET BY MOUTH EVERY DAY 90 tablet 1   timolol (TIMOPTIC) 0.5 % ophthalmic solution timolol maleate 0.5 % eye drops  INSTILL 1 DROP INTO RIGHT EYE TWICE A DAY     zolpidem (AMBIEN) 10 MG tablet TAKE 1/2 (ONE-HALF) TABLET BY MOUTH AT BEDTIME AND  TAKE  THE  OTHER  HALF  IF  NEEDED 30 tablet 0   aspirin EC 81 MG tablet Take 1 tablet by mouth daily. (Patient not taking: Reported on 04/24/2021)     No facility-administered medications prior to visit.    ROS: Review of Systems  Constitutional:  Positive for fatigue. Negative for appetite change and unexpected weight change.  HENT:  Positive for congestion, sinus pressure and tinnitus. Negative for nosebleeds, sneezing, sore throat and trouble swallowing.   Eyes:  Negative for itching and visual disturbance.  Respiratory:  Negative for cough.   Cardiovascular:  Negative for chest pain, palpitations  and leg swelling.  Gastrointestinal:  Negative for abdominal distention, blood in stool, diarrhea and nausea.  Genitourinary:  Negative for frequency and hematuria.  Musculoskeletal:  Positive for arthralgias, back pain and gait problem. Negative for joint swelling and neck pain.  Skin:  Negative for rash.  Neurological:  Positive for weakness. Negative for dizziness, tremors and speech difficulty.  Psychiatric/Behavioral:  Positive for dysphoric mood. Negative for agitation, sleep disturbance and suicidal ideas. The patient is nervous/anxious.    Objective:  BP 118/82 (BP Location: Left Arm)   Pulse 74   Temp 97.6 F (36.4 C) (Oral)   Ht 6' (1.829 m)   Wt 181 lb (82.1 kg)   SpO2 97%   BMI 24.55 kg/m   BP Readings from Last 3 Encounters:  04/24/21 118/82  03/02/21 (!) 136/98  01/31/21 (!) 152/90     Wt Readings from Last 3 Encounters:  04/24/21 181 lb (82.1 kg)  03/02/21 181 lb (82.1 kg)  01/31/21 180 lb 9.6 oz (81.9 kg)    Physical Exam Constitutional:      General: He is not in acute distress.    Appearance: He is well-developed.     Comments: NAD  Eyes:     Conjunctiva/sclera: Conjunctivae normal.     Pupils: Pupils are equal, round, and reactive to light.  Neck:     Thyroid: No thyromegaly.     Vascular: No JVD.  Cardiovascular:     Rate and Rhythm: Normal rate and regular rhythm.     Heart sounds: Normal heart sounds. No murmur heard.   No friction rub. No gallop.  Pulmonary:     Effort: Pulmonary effort is normal. No respiratory distress.     Breath sounds: Normal breath sounds. No wheezing or rales.  Chest:     Chest wall: No tenderness.  Abdominal:     General: Bowel sounds are normal. There is no distension.     Palpations: Abdomen is soft. There is no mass.     Tenderness: There is no abdominal tenderness. There is no guarding or rebound.  Musculoskeletal:        General: Tenderness present. Normal range of motion.     Cervical back: Normal range of motion.  Lymphadenopathy:     Cervical: No cervical adenopathy.  Skin:    General: Skin is warm and dry.     Findings: No rash.  Neurological:     Mental Status: He is alert and oriented to person, place, and time.     Cranial Nerves: No cranial nerve deficit.     Motor: No abnormal muscle tone.     Coordination: Coordination normal.     Gait: Gait abnormal.     Deep Tendon Reflexes: Reflexes are normal and symmetric.  Psychiatric:        Behavior: Behavior normal.        Thought Content: Thought content normal.        Judgment: Judgment normal.  LS is tender Swollen nares  Lab Results  Component Value Date   WBC 10.3 02/22/2020   HGB 16.2 02/22/2020   HCT 48.7 02/22/2020   PLT 235.0 02/22/2020   GLUCOSE 102 (H) 01/31/2021   CHOL 180 10/30/2020   TRIG 98 10/30/2020   HDL 84 10/30/2020    LDLDIRECT 59.0 02/02/2016   LDLCALC 79 10/30/2020   ALT 40 10/30/2020   AST 35 10/30/2020   NA 134 (L) 01/31/2021   K 4.1 01/31/2021   CL 101 01/31/2021  CREATININE 0.97 01/31/2021   BUN 24 (H) 01/31/2021   CO2 24 01/31/2021   TSH 2.09 01/31/2021   PSA 2.59 02/22/2020   INR 1.0 09/13/2019   HGBA1C 5.5 09/29/2018    MR CERVICAL SPINE WO CONTRAST  Result Date: 03/29/2021 CLINICAL DATA:  Neck pain and balance issues EXAM: MRI CERVICAL SPINE WITHOUT CONTRAST TECHNIQUE: Multiplanar, multisequence MR imaging of the cervical spine was performed. No intravenous contrast was administered. COMPARISON:  07/30/2019 FINDINGS: Alignment: Straightening of the normal cervical lordosis, with 3 mm anterolisthesis C4 on C5, unchanged, and trace retrolisthesis of C5 on C6, unchanged. Vertebrae: No fracture, evidence of discitis, or bone lesion. Cord: Normal signal and morphology. Posterior Fossa, vertebral arteries, paraspinal tissues: Negative. Disc levels: C2-C3: Moderate to severe bilateral facet arthropathy. No significant disc bulge. No spinal canal stenosis. Mild right neural foraminal narrowing. C3-C4: No significant disc bulge. No spinal canal stenosis. Left greater than right facet arthropathy. No neural foraminal narrowing. C4-C5: 3 mm anterolisthesis with disc unroofing. Ligamentum flavum hypertrophy. Moderate to severe spinal canal stenosis, which has progressed slightly from the prior exam. No cord flattening. Left greater than right facet arthropathy. Moderate left neural foraminal narrowing. C5-C6: Trace retrolisthesis with disc height loss and broad-based disc bulge. Uncovertebral and facet arthropathy. Mild spinal canal stenosis. Moderate left greater than right neural foraminal narrowing. C6-C7: Disc height loss and broad-based disc bulge. Facet and uncovertebral hypertrophy. Mild spinal canal stenosis. Moderate bilateral neural foraminal narrowing. C7-T1: No significant disc bulge. Mild facet  arthropathy. No spinal canal stenosis or neuroforaminal narrowing. IMPRESSION: 1. C4-C5 moderate to severe spinal canal stenosis, which has progressed slightly from the prior exam. Moderate left neural foraminal narrowing at this level. 2. C5-C6 and C6-C7 mild spinal canal stenosis and moderate bilateral neural foraminal narrowing. Electronically Signed   By: Merilyn Baba [name]   On: 03/29/2021 19:51    Assessment & Plan:     Walker Kehr, MD

## 2021-04-24 NOTE — Assessment & Plan Note (Addendum)
worse B: try Gabapentin 100-200 or 300 mg bid Treat for poss sinusitis Given Augmentin Afrin x 4-5 d

## 2021-04-25 ENCOUNTER — Telehealth: Payer: Self-pay | Admitting: *Deleted

## 2021-04-25 ENCOUNTER — Telehealth: Payer: Self-pay | Admitting: Internal Medicine

## 2021-04-25 ENCOUNTER — Ambulatory Visit (HOSPITAL_COMMUNITY): Payer: No Typology Code available for payment source | Admitting: Licensed Clinical Social Worker

## 2021-04-25 LAB — IRON,TIBC AND FERRITIN PANEL
%SAT: 25 % (calc) (ref 20–48)
Ferritin: 22 ng/mL — ABNORMAL LOW (ref 24–380)
Iron: 111 ug/dL (ref 50–180)
TIBC: 436 mcg/dL (calc) — ABNORMAL HIGH (ref 250–425)

## 2021-04-25 NOTE — Telephone Encounter (Signed)
Patient calling in about recent pathology results  Would like for provider to review them & let him know his input & recommendations  Please call patient as soon as possible

## 2021-04-26 ENCOUNTER — Encounter (HOSPITAL_COMMUNITY): Payer: Self-pay | Admitting: Licensed Clinical Social Worker

## 2021-04-26 ENCOUNTER — Other Ambulatory Visit: Payer: Self-pay

## 2021-04-26 ENCOUNTER — Encounter (INDEPENDENT_AMBULATORY_CARE_PROVIDER_SITE_OTHER): Payer: No Typology Code available for payment source | Admitting: Ophthalmology

## 2021-04-26 ENCOUNTER — Ambulatory Visit (INDEPENDENT_AMBULATORY_CARE_PROVIDER_SITE_OTHER): Payer: No Typology Code available for payment source | Admitting: Licensed Clinical Social Worker

## 2021-04-26 DIAGNOSIS — F319 Bipolar disorder, unspecified: Secondary | ICD-10-CM

## 2021-04-26 NOTE — Progress Notes (Signed)
Virtual virtual Phone Note  I connected with Alex [name] on 04/26/2021 at 1:00 pm EST by phone-enabled virtual visit. I verified that I am speaking with the correct person using  two identifiers.I discussed the limitations of evaluation and management by telemedicine and the availability of in person appointments. The patient expressed understanding and agreed to proceed.    LOCATION: Patient: Home Provider: Home office  History of Present Illness:  Pt was referred by Dr. Adele Schilder for OP therapy for bipolar disorder and anxiety.    Observations/Objective: Patient presented for today's session on time and was alert, oriented x5, with no evidence or self-report of SI/HI or A/V H.  Patient reported ongoing compliance with medication and denied any use of alcohol or illicit substances.  Clinician inquired about patient's current emotional ratings, as well as any significant changes in thoughts, feelings or behavior since previous session.  Patient reported scores of 8/10 for depression, 8/10 for anxiety, 4/10 or anger/irritability. Pt discusses his physical and emotional pain and coping skills used. Pt reports he had some lesions on his scalp, which was sent to the lab, no results yet. "This has increased my anxiety exponentially and I'm having interrupted sleep." Clinician utilized MI OARS to reflect and summarize thoughts and feelings. [name] and pt explored his continued stressors: adult children who still live at home, health issues, retirement plans. Clinician utilized MI OARS to affirm concerns. Clinician challenged his thoughts. Clinician processed options for communicating his concerns.   Plan: Codependent No More  Follow Up Instructions:  I discussed the assessment and treatment plan with the patient. The patient was provided an opportunity to ask questions and all were answered. The patient agreed with the plan and demonstrated an understanding of the instructions.   The patient was advised  to call back or seek an in-person evaluation if the symptoms worsen or if the condition fails to improve as anticipated.  I provided 60 minutes of non-face-to-face time during this encounter.   Emori Kamau S, LCAS

## 2021-04-27 ENCOUNTER — Encounter: Payer: Self-pay | Admitting: Oncology

## 2021-04-27 NOTE — Telephone Encounter (Signed)
Alex [name] called wanting Dr Jana Hakim to know about his recent pathology post scalp lesions removed by Dr Denna Haggard with reading of Squamous cell carcinoma.  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  " I am hoping Dr Jana Hakim would order scans to see if this cancer has spread "  This RN informed MD of above.

## 2021-04-27 NOTE — Telephone Encounter (Signed)
Skin biopsy pathology per Dr. 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 Alex [name]'s labs/tests are good overall-no change in plans. Thx

## 2021-04-27 NOTE — Telephone Encounter (Signed)
Notified pt w/MD response.../lmb 

## 2021-04-30 ENCOUNTER — Other Ambulatory Visit: Payer: Self-pay | Admitting: Oncology

## 2021-04-30 DIAGNOSIS — C6960 Malignant neoplasm of unspecified orbit: Secondary | ICD-10-CM

## 2021-05-01 ENCOUNTER — Encounter: Payer: Self-pay | Admitting: Dermatology

## 2021-05-04 NOTE — Progress Notes (Signed)
Follow-Up Visit   Subjective  Alex [name] is a 65 y.o. male who presents for the following: Actinic Keratosis (More ak's to treat per patient scalp sore x months, June 20th he seen dermatologist in [place] and had some ln2 done that was no help due to scheduling ).  Growing crusts on scalp; no response to recent freezing at another dermatology practice Location:  Duration:  Quality:  Associated Signs/Symptoms: Modifying Factors:  Severity:  Timing: Context:   Objective  Well appearing patient in no apparent distress; mood and affect are within normal limits. Apparently sentences last visit to our office patient became concerned about new spots on scalp and sorbet Dr. Leonides Schanz PA at another dermatology office.  He stated that several spots on his scalp were frozen by there was no improvement so he returns to our office.  posterior Scalp 1.1 cm pink adherent pink crust       left Mid Parietal Scalp 8 mm thick crust       Right Parietal Scalp 10 mm thick crust         A focused examination was performed including head and neck. Relevant physical exam findings are noted in the Assessment and Plan.   Assessment & Plan    AK (actinic keratosis)  As usual, patient has diffuse actinic keratoses.  The 3 thicker spots on her scalp (perhaps the ones that were recently frozen) almost certainly represent superficial carcinoma and will be biopsied and treated as such.  Carcinoma in situ of skin of scalp and neck (3) posterior Scalp  Skin / nail biopsy Type of biopsy: tangential   Informed consent: discussed and consent obtained   Timeout: patient name, date of birth, surgical site, and procedure verified   Anesthesia: the lesion was anesthetized in a standard fashion   Anesthetic:  1% lidocaine w/ epinephrine 1-100,000 local infiltration Instrument used: flexible razor blade   Hemostasis achieved with: ferric subsulfate   Outcome: patient tolerated procedure  well   Post-procedure details: wound care instructions given    Destruction of lesion Complexity: simple   Destruction method: electrodesiccation and curettage   Informed consent: discussed and consent obtained   Timeout:  patient name, date of birth, surgical site, and procedure verified Anesthesia: the lesion was anesthetized in a standard fashion   Anesthetic:  1% lidocaine w/ epinephrine 1-100,000 local infiltration Curettage performed in three different directions: Yes   Curettage cycles:  3 Lesion length (cm):  1.2 Lesion width (cm):  1.2 Margin per side (cm):  0 Final wound size (cm):  1.2 Hemostasis achieved with:  aluminum chloride Outcome: patient tolerated procedure well with no complications   Post-procedure details: wound care instructions given    Specimen 1 - Surgical pathology Differential Diagnosis: scc vs bcc txpbx Check Margins: No  left Mid Parietal Scalp  Skin / nail biopsy Type of biopsy: tangential   Informed consent: discussed and consent obtained   Timeout: patient name, date of birth, surgical site, and procedure verified   Anesthesia: the lesion was anesthetized in a standard fashion   Anesthetic:  1% lidocaine w/ epinephrine 1-100,000 local infiltration Instrument used: flexible razor blade   Hemostasis achieved with: ferric subsulfate   Outcome: patient tolerated procedure well   Post-procedure details: wound care instructions given    Destruction of lesion Complexity: simple   Destruction method: electrodesiccation and curettage   Informed consent: discussed and consent obtained   Timeout:  patient name, date of birth, surgical site, and procedure  verified Anesthesia: the lesion was anesthetized in a standard fashion   Anesthetic:  1% lidocaine w/ epinephrine 1-100,000 local infiltration Curettage performed in three different directions: Yes   Curettage cycles:  3 Lesion length (cm):  1 Lesion width (cm):  1 Margin per side (cm):   0 Final wound size (cm):  1 Hemostasis achieved with:  aluminum chloride Outcome: patient tolerated procedure well with no complications   Post-procedure details: wound care instructions given    Specimen 2 - Surgical pathology Differential Diagnosis: scc vs bcc txpbx Check Margins: No  Right Parietal Scalp  Skin / nail biopsy Type of biopsy: tangential   Informed consent: discussed and consent obtained   Timeout: patient name, date of birth, surgical site, and procedure verified   Anesthesia: the lesion was anesthetized in a standard fashion   Anesthetic:  1% lidocaine w/ epinephrine 1-100,000 local infiltration Instrument used: flexible razor blade   Hemostasis achieved with: ferric subsulfate   Outcome: patient tolerated procedure well   Post-procedure details: wound care instructions given    Destruction of lesion Complexity: simple   Destruction method: electrodesiccation and curettage   Informed consent: discussed and consent obtained   Timeout:  patient name, date of birth, surgical site, and procedure verified Anesthesia: the lesion was anesthetized in a standard fashion   Anesthetic:  1% lidocaine w/ epinephrine 1-100,000 local infiltration Curettage performed in three different directions: Yes   Curettage cycles:  3 Lesion length (cm):  1.2 Lesion width (cm):  1.2 Margin per side (cm):  0 Final wound size (cm):  1.2 Hemostasis achieved with:  aluminum chloride Outcome: patient tolerated procedure well with no complications   Post-procedure details: wound care instructions given    Specimen 3 - Surgical pathology Differential Diagnosis: txpbx txpbx Check Margins: No      I, [name], MD, have reviewed all documentation for this visit.  The documentation on 05/04/21 for the exam, diagnosis, procedures, and orders are all accurate and complete.

## 2021-05-12 ENCOUNTER — Encounter (HOSPITAL_COMMUNITY): Payer: Self-pay | Admitting: Emergency Medicine

## 2021-05-12 ENCOUNTER — Ambulatory Visit (HOSPITAL_COMMUNITY)
Admission: EM | Admit: 2021-05-12 | Discharge: 2021-05-12 | Disposition: A | Discharge status: Home / Self Care | Payer: No Typology Code available for payment source | Attending: Physician Assistant | Admitting: Physician Assistant

## 2021-05-12 ENCOUNTER — Other Ambulatory Visit: Payer: Self-pay

## 2021-05-12 DIAGNOSIS — J0181 Other acute recurrent sinusitis: Secondary | ICD-10-CM | POA: Diagnosis not present

## 2021-05-12 MED ORDER — AMOXICILLIN-POT CLAVULANATE 875-125 MG PO TABS: 1.0000 | ORAL_TABLET | Freq: Two times a day (BID) | ORAL | 0 refills | Status: DC

## 2021-05-12 NOTE — ED Triage Notes (Signed)
Pt is present today with sinus pressure, bilateral ear ringing, and nasal congestion. Pt states sx started last Tuesday

## 2021-05-12 NOTE — ED Provider Notes (Signed)
[facility]    CSN: [id number] Arrival date & time: 05/12/21  1431      History   Chief Complaint Chief Complaint  Patient presents with   Facial Pain   Tinnitus   Nasal Congestion    HPI Alex [name] is a 65 y.o. male.   Patient here today for evaluation of recurrent sinus congestion and pressure that returned after recent antibiotic therapy.  He reports that he has surgery scheduled in 9 days and is concerned for recurrent sinus infection.  He has not had any fever or chills.  He denies any sore throat.  He does not report any cough or shortness of breath.  He was recently prescribed amoxicillin and does report that symptoms improved while he was taking antibiotics.    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   Cataract    LEFT EYE   Chronic back pain    Complication of anesthesia POST URINARY RETENTION---  2006 SHOULDER SURGERY MARKED BRADYCARDIA VAGAL RESPONSE NO ISSUE W/ SURGERY AFTER THIS ONE   WITH GENERAL ANESTHESIA, 15 YRS AGO VASOVAGAL REACTION NONE SINCE   Corneal hemorrhage 06/03/2018   Entire left eye   Coronary atherosclerosis CARDIOLOGIST- DR CRENSHAW--  LAST VISIT 01-05-2012 IN EPIC   NON-OBSTRUCTIVE MILD DISEASE   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HCC)    Depression    Epicondylitis    right elbow   GERD (gastroesophageal reflux disease)    Glaucoma BOTH EYES   RIGHT EYE RADIATION DAMAGE    Hearing loss    BOTH EARS   Hearing loss    Bilateral   Hepatic cyst    Several, The lesion of concern in segment 6 of the liver has single large portal vein and hepatic vein branches extending to tt, in a pattern of enhancement which mirrors these vascular structures. 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 9 CM X  4. 3 CM PER NOV 06-14-17  CHEST CTFOLOWED BY DR CRENSHAW YEARLY FOR   Positional vertigo    HX OF WITH SINUS INFECTIONS   Radial head fracture    Right   Squamous cell carcinoma of skin 06/04/2016   in situ-crown of scalp   Squamous cell carcinoma of skin 04/22/2017   in situ-crown scalp (txpbx)   Thoracic aortic aneurysm 06/14/2017   last CT 4.1 CM Mild   Tinnitus    CONSTANT   Ulnar nerve compression    right elbow   Unsteady gait    especially with stairs, depth perception off   Urinary hesitancy    Wears glasses     Patient Active Problem List   Diagnosis Date Noted   PMR (polymyalgia rheumatica) (HCC) 02/22/2020   Diverticulitis 01/25/2020   Abdominal pain 01/25/2020   Iliac vessel injury 11/30/2019   Stress at home 09/14/2019   LLQ  abdominal pain 09/06/2019   History of thyroid cancer 06/03/2019   Abnormal findings on diagnostic imaging of liver 06/03/2019   Epididymitis 03/16/2019   Difficulty urinating 03/16/2019   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/29/2018   Moderate persistent asthma with acute exacerbation 09/29/2018   Prediabetes 09/29/2018   RTI (respiratory tract infection) 09/29/2018   Bruising 11/17/2017   Testicular pain, right 08/01/2017   Fatigue 01/21/2017   Ankle pain, left 01/21/2017   Fall (on) (from) other stairs and steps, initial encounter 11/11/2016   Abrasion of head 11/11/2016   Ascending aortic aneurysm 07/23/2016   Erectile dysfunction 07/19/2016   Bronchiectasis ([place]) 04/23/2016   Chronic bronchitis ([place]) 03/11/2016   Acute URI 11/03/2015   Cerumen impaction 08/04/2015   Cervical disc disorder with radiculopathy of cervical region 06/06/2014   Right elbow pain 06/06/2014   Elevated WBC count 06/06/2014   Iron deficiency anemia 06/06/2014   Pre-operative exam 04/07/2014   Chronic right SI joint pain 02/21/2014   Edema 02/21/2014   Tinnitus 02/21/2014   Well adult exam 05/31/2013   Glaucoma 05/31/2013   RML pneumonia 03/31/2013   Hypertension, uncontrolled 02/25/2013   Ingrowing toenail with infection 04/02/2012   Localized osteoarthritis of left knee 04/02/2012   Vertigo 01/05/2012   Ataxia 01/05/2012   Anxiety state 08/08/2010   Chest pain, atypical 02/08/2010   ESOPHAGEAL STRICTURE 11/30/2009   CHANGE IN BOWELS 11/30/2009   Major depression, chronic 08/07/2009   Osteoarthritis 08/07/2009   GENERALIZED OSTEOARTHROSIS UNSPECIFIED SITE 10/20/2008   Dyslipidemia 10/19/2008   FOOT PAIN 07/11/2008   Orchitis and epididymitis 05/24/2008   RASH-NONVESICULAR 05/14/2008   Cough 03/29/2008   Coronary atherosclerosis 11/04/2007   Lumbago 11/04/2007   THYROIDECTOMY, HX OF 11/04/2007   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   NEOPLASM, MALIGNANT, THYROID GLAND 10/02/2007    Hypogonadism male 10/02/2007   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[place]) 10/02/2007   IMPAIRED GLUCOSE TOLERANCE 10/02/2007    Past Surgical History:  Procedure Laterality Date   CARDIAC CATHETERIZATION  01-16-2006  DR Marcello Moores WALL   MILD CORONARY ATHEROSCLEROSIS/ MID TO DISTAL LAD 40% STENOSIS/ LVF 50-55%   CARPAL TUNNEL RELEASE Right 11/03/2017   Procedure: RIGHT HAND CARPAL TUNNEL RELEASE;  Surgeon: Iran Planas, MD;  Location: [facility];  Service: Orthopedics;  Laterality: Right;   CATARACT EXTRACTION Right    COLONSCOPY  2017 LAST DONE   MULTIPLE   ENDOSCOPY  LAST 2017   MULTIPLE DONE DILATION DONE ALSO   ESOPHAGOGASTRODUODENOSCOPY (EGD) WITH PROPOFOL N/A 02/24/2018   Procedure: ESOPHAGOGASTRODUODENOSCOPY (EGD) WITH PROPOFOL;  Surgeon: Oletta Lamas,  Jeneen Rinks, MD;  Location: [facility];  Service: Endoscopy;  Laterality: N/A;   EXCISION RADIAL HEAD Right 11/03/2017   Procedure: RIGHT PROXIMAL RADIUS RADIAL HEAD RESECTION AND JOINT DEBRIDEMENT;  Surgeon: Iran Planas, MD;  Location: [facility];  Service: Orthopedics;  Laterality: Right;   EXTRACORPOREAL SHOCK WAVE LITHOTRIPSY Right 11/20/2020   Procedure: EXTRACORPOREAL SHOCK WAVE LITHOTRIPSY (ESWL);  Surgeon: Alexis Frock, MD;  Location: [facility];  Service: Urology;  Laterality: Right;  75 MINS   KNEE ARTHROSCOPY  05/01/2012   Procedure: ARTHROSCOPY KNEE;  Surgeon: Johnn Hai, MD;  Location: [facility];  Service: Orthopedics;  Laterality: Left;  debridement and removal of loose body   LEFT ANKLE ARTHROSCOPY W/ DEBRIDEMENT  05-12-2007   LEFT HYDROCELECTOMY  03-29-2005   AND REPAIR LEFT INGUINAL HERNIA W/ MESH   MOHS SURGERY  2002   RIGHT ORBITAL CANCER   NASAL ENDOSCOPY  08-07-2005   RIGHT EPISTAXIS  / POST SEPTOPLASTY  (HX RIGHT ORBITAL CA & S/P RADIATION/ NECROSIS ANTERIOR END OF BOTH INFERIOR TURBINATES)   occuloplastic surgery  2002   PARS PLANA VITRECTOMY  11-06-2004    RIGHT EYE RADIATION RETINOPATHY W/ HEMORRHAGE   RADIAL HEAD ARTHROPLASTY Right 06/15/2018   Procedure: RIGHT ELBOW PROXIMAL RADIOULNAR JOINT DEBRIDEMENT AND ARTHROPLASTY;  Surgeon: Iran Planas, MD;  Location: [facility];  Service: Orthopedics;  Laterality: Right;  BLOCK WITH SEDATION   REPAIR UNDESENDED RIGHT TESTICLE / RIGHT INGUINAL HERNIA  AGE 77   RIGHT ANKLE ARTHROSCOPY W/ EXTENSIVE DEBRIDEMENT  04/05/2008   x2   RIGHT SHOULDER SURGERY  2006   RIGHT SUPRAOMOHYOID NECK DISSECTION   03-08-2003   ZONES 1,2,3;   SUBMANDIBULAR MASS / METASTATIC SQUAMOUS CELL CARCINOMA RIGHT NECK   SAVORY DILATION N/A 02/24/2018   Procedure: SAVORY DILATION;  Surgeon: Laurence Spates, MD;  Location: [facility];  Service: Endoscopy;  Laterality: N/A;   SEPTOPLASTY  NOV 2006   SHOULDER ARTHROSCOPY Left    SHOULDER ARTHROSCOPY W/ SUBACROMIAL DECOMPRESSION AND DISTAL CLAVICLE EXCISION  10-09-2008   AND DEBRIDEMENT OF RIGHT SHOULDER IMPINGEMENT & [facility] JOINT ARTHRITIS   SPINE SURGERY  2016   l 3 TO l 4 PLATE AND SCREWS   TOTAL THYROIDECTOMY  11-03-2001   PAPILLARY THYROID CARCINOMA   TRANSTHORACIC ECHOCARDIOGRAM  12/ 2012   grade I diastolic dysfunction/ ef 70-26%   ULNAR NERVE TRANSPOSITION Right 04/28/2014   Procedure: RIGHT ELBOW ULNA NERVE RELEASE TRANSPOSTION AND MEDIAL EPICONDYLAR DEBRIDEMENT AND REPAIR;  Surgeon: Linna Hoff, MD;  Location: [facility];  Service: Orthopedics;  Laterality: Right;       Home Medications    Prior to Admission medications   Medication Sig Start Date End Date Taking? Authorizing Provider  amoxicillin-clavulanate (AUGMENTIN) 875-125 MG tablet Take 1 tablet by mouth every 12 (twelve) hours. 05/12/21  Yes Francene Finders, PA-C  amLODipine (NORVASC) 10 MG tablet Take 1 tablet (10 mg total) by mouth daily. 03/02/21 05/31/21  Lelon Perla, MD  aspirin EC 81 MG tablet Take 1 tablet by mouth daily. Patient not taking: Reported on 04/24/2021     [provider]  atorvastatin (LIPITOR) 40 MG tablet Take 1 tablet (40 mg total) by mouth daily. 03/16/21   Lelon Perla, MD  AXIRON 30 MG/ACT SOLN Place 2 Act onto the skin daily. 06/28/15   [provider]  Coenzyme Q10 (CO Q 10 PO) Take 1 capsule by mouth daily.     [provider]  gabapentin (NEURONTIN) 100 MG capsule Take 1-3 capsules (100-300 mg total) by mouth 2 (two) times daily as needed. Ringing in the ears 04/24/21   Plotnikov, Evie Lacks, MD  Immune Globulin, Human, 4 GM/20ML SOLN Inject into the skin once a week. wednesday    [provider]  LamoTRIgine 300 MG TB24 24 hour tablet Take 1 tablet (300 mg total) by mouth every morning. 02/27/21   Arfeen, Arlyce Harman, MD  latanoprost (XALATAN) 0.005 % ophthalmic solution 1 DROP EACH EYE AT NIGHT    [provider]  levothyroxine (SYNTHROID) 150 MCG tablet Take 1 tablet (150 mcg total) by mouth daily before breakfast. Annual appt due in July must see provider for future refills 12/19/20   Plotnikov, Evie Lacks, MD  LORazepam (ATIVAN) 0.5 MG tablet Take 1 tablet (0.5 mg total) by mouth 2 (two) times daily. 03/20/21   Arfeen, Arlyce Harman, MD  LUTEIN PO Take 1 tablet by mouth daily.     [provider]  LYCOPENE PO Take by mouth.    [provider]  meloxicam (MOBIC) 15 MG tablet TAKE 1 TABLET BY MOUTH EVERY DAY AS NEEDED 04/09/21   Plotnikov, Evie Lacks, MD  Multiple Vitamin (MULTI-VITAMINS) TABS Take by mouth.    [provider]  OMEGA-3 KRILL OIL PO Take 1 capsule by mouth daily.     [provider]  omeprazole (PRILOSEC) 20 MG capsule Take 20 mg by mouth every evening.    [provider]  sildenafil (REVATIO) 20 MG tablet Take 1 tablet by mouth once daily 11/27/20   Plotnikov, Evie Lacks, MD  tadalafil (CIALIS) 20 MG tablet TAKE 1 TABLET BY MOUTH ONCE DAILY AS NEEDED FOR ERECTILE DYSFUNCTION Patient taking differently: 5 mg. 02/11/20   Plotnikov, Evie Lacks, MD   tamsulosin (FLOMAX) 0.4 MG CAPS capsule TAKE 1 CAPSULE BY MOUTH EVERY DAY Patient taking differently: as needed. 06/07/19   Plotnikov, Evie Lacks, MD  telmisartan (MICARDIS) 80 MG tablet TAKE 1 TABLET BY MOUTH EVERY DAY 12/19/20   Lelon Perla, MD  timolol (TIMOPTIC) 0.5 % ophthalmic solution timolol maleate 0.5 % eye drops  INSTILL 1 DROP INTO RIGHT EYE TWICE A DAY 10/05/19   [provider]  zolpidem (AMBIEN) 10 MG tablet TAKE 1/2 (ONE-HALF) TABLET BY MOUTH AT BEDTIME AND  TAKE  THE  OTHER  HALF  IF  NEEDED 04/06/21   Arfeen, Arlyce Harman, MD    Family History Family History  Problem Relation Age of Onset   Depression Sister    Rectal cancer Sister    Lung cancer Brother    Kidney disease Mother    Depression Mother    Stroke Father    COPD Father    Hypertension Father    Prostate cancer Father    Depression Daughter    Drug abuse Daughter    Psychiatric Illness Son     Social History Social History   Tobacco Use   Smoking status: Never   Smokeless tobacco: Never  Vaping Use   Vaping Use: Never used  Substance Use Topics   Alcohol use: Yes    Alcohol/week: 0.0 standard drinks    Comment: 1-2 glasses per month   Drug use: No     Allergies   Percocet [oxycodone-acetaminophen] and Vicodin [hydrocodone-acetaminophen]   Review of Systems Review of Systems  Constitutional:  Negative for chills and fever.  HENT:  Positive for congestion and tinnitus. Negative for ear pain and sore throat.  Eyes:  Negative for discharge and redness.  Respiratory:  Negative for cough and shortness of breath.   Gastrointestinal:  Negative for abdominal pain, nausea and vomiting.    Physical Exam Triage Vital Signs ED Triage Vitals  Enc Vitals Group     BP 05/12/21 1453 (!) 161/100     Pulse Rate 05/12/21 1453 84     Resp 05/12/21 1453 18     Temp 05/12/21 1453 98 F (36.7 C)     Temp src --      SpO2 05/12/21 1453 95 %     Weight --      Height --      Head  Circumference --      Peak Flow --      Pain Score 05/12/21 1458 0     Pain Loc --      Pain Edu? --      Excl. in [place]? --    No data found.  Updated Vital Signs BP (!) 161/100   Pulse 84   Temp 98 F (36.7 C)   Resp 18   SpO2 95%      Physical Exam Vitals and nursing note reviewed.  Constitutional:      General: He is not in acute distress.    Appearance: Normal appearance. He is not ill-appearing.  HENT:     Head: Normocephalic and atraumatic.     Right Ear: Tympanic membrane normal.     Left Ear: Tympanic membrane normal.     Nose: Congestion present.     Mouth/Throat:     Mouth: Mucous membranes are moist.     Pharynx: Oropharynx is clear. No oropharyngeal exudate or posterior oropharyngeal erythema.  Eyes:     Conjunctiva/sclera: Conjunctivae normal.  Cardiovascular:     Rate and Rhythm: Normal rate and regular rhythm.     Heart sounds: Normal heart sounds. No murmur heard. Pulmonary:     Effort: Pulmonary effort is normal. No respiratory distress.     Breath sounds: Normal breath sounds. No wheezing, rhonchi or rales.  Skin:    General: Skin is warm and dry.  Neurological:     Mental Status: He is alert.  Psychiatric:        Mood and Affect: Mood normal.        Thought Content: Thought content normal.     UC Treatments / Results  Labs (all labs ordered are listed, but only abnormal results are displayed) Labs Reviewed - No data to display  EKG   Radiology No results found.  Procedures Procedures (including critical care time)  Medications Ordered in UC Medications - No data to display  Initial Impression / Assessment and Plan / UC Course  I have reviewed the triage vital signs and the nursing notes.  Pertinent labs & imaging results that were available during my care of the patient were reviewed by me and considered in my medical decision making (see chart for details).  Suspect recurrent sinusitis with bacteria that was likely resistant to  amoxicillin.  We will treat with Augmentin, and encouraged follow-up if no gradual improvement or if symptoms worsen anyway.  Final Clinical Impressions(s) / UC Diagnoses   Final diagnoses:  Other acute recurrent sinusitis     Discharge Instructions      Take antibiotic as prescribed. Follow up with any further concerns.      ED Prescriptions     Medication Sig Dispense Auth. Provider   amoxicillin-clavulanate (AUGMENTIN) 875-125 MG tablet  Take 1 tablet by mouth every 12 (twelve) hours. 14 tablet Francene Finders, PA-C      PDMP not reviewed this encounter.   Francene Finders, PA-C 05/12/21 [phone]

## 2021-05-12 NOTE — Discharge Instructions (Addendum)
Take antibiotic as prescribed. Follow up with any further concerns.

## 2021-05-14 ENCOUNTER — Encounter (HOSPITAL_COMMUNITY): Payer: Self-pay | Admitting: Licensed Clinical Social Worker

## 2021-05-14 ENCOUNTER — Inpatient Hospital Stay: Payer: No Typology Code available for payment source | Attending: Oncology

## 2021-05-14 ENCOUNTER — Encounter: Payer: Self-pay | Admitting: Oncology

## 2021-05-14 ENCOUNTER — Other Ambulatory Visit: Payer: Self-pay

## 2021-05-14 ENCOUNTER — Telehealth: Payer: Self-pay | Admitting: Internal Medicine

## 2021-05-14 ENCOUNTER — Ambulatory Visit (INDEPENDENT_AMBULATORY_CARE_PROVIDER_SITE_OTHER): Payer: No Typology Code available for payment source | Admitting: Licensed Clinical Social Worker

## 2021-05-14 DIAGNOSIS — Z85828 Personal history of other malignant neoplasm of skin: Secondary | ICD-10-CM | POA: Insufficient documentation

## 2021-05-14 DIAGNOSIS — Z85850 Personal history of malignant neoplasm of thyroid: Secondary | ICD-10-CM | POA: Insufficient documentation

## 2021-05-14 DIAGNOSIS — F319 Bipolar disorder, unspecified: Secondary | ICD-10-CM

## 2021-05-14 DIAGNOSIS — C73 Malignant neoplasm of thyroid gland: Secondary | ICD-10-CM

## 2021-05-14 DIAGNOSIS — C6960 Malignant neoplasm of unspecified orbit: Secondary | ICD-10-CM

## 2021-05-14 LAB — COMPREHENSIVE METABOLIC PANEL
ALT: 41 U/L (ref 0–44)
AST: 36 U/L (ref 15–41)
Albumin: 3.9 g/dL (ref 3.5–5.0)
Alkaline Phosphatase: 85 U/L (ref 38–126)
Anion gap: 9 (ref 5–15)
BUN: 20 mg/dL (ref 8–23)
CO2: 23 mmol/L (ref 22–32)
Calcium: 8.6 mg/dL — ABNORMAL LOW (ref 8.9–10.3)
Chloride: 108 mmol/L (ref 98–111)
Creatinine, Ser: 1 mg/dL (ref 0.61–1.24)
GFR, Estimated: >60 mL/min (ref >60)
Glucose, Bld: 95 mg/dL (ref 70–99)
Potassium: 4.3 mmol/L (ref 3.5–5.1)
Sodium: 140 mmol/L (ref 135–145)
Total Bilirubin: 0.8 mg/dL (ref 0.3–1.2)
Total Protein: 6.7 g/dL (ref 6.5–8.1)

## 2021-05-14 LAB — CBC WITH DIFFERENTIAL/PLATELET
Abs Immature Granulocytes: 0.03 10*3/uL (ref 0.00–0.07)
Basophils Absolute: 0.1 10*3/uL (ref 0.0–0.1)
Basophils Relative: 1 %
Eosinophils Absolute: 0.5 10*3/uL (ref 0.0–0.5)
Eosinophils Relative: 5 %
HCT: 48.9 % (ref 39.0–52.0)
Hemoglobin: 16.5 g/dL (ref 13.0–17.0)
Immature Granulocytes: 0 %
Lymphocytes Relative: 16 %
Lymphs Abs: 1.6 10*3/uL (ref 0.7–4.0)
MCH: 26.9 pg (ref 26.0–34.0)
MCHC: 33.7 g/dL (ref 30.0–36.0)
MCV: 79.6 fL — ABNORMAL LOW (ref 80.0–100.0)
Monocytes Absolute: 1 10*3/uL (ref 0.1–1.0)
Monocytes Relative: 10 %
Neutro Abs: 6.6 10*3/uL (ref 1.7–7.7)
Neutrophils Relative %: 68 %
Platelets: 207 10*3/uL (ref 150–400)
RBC: 6.14 MIL/uL — ABNORMAL HIGH (ref 4.22–5.81)
RDW: 16.7 % — ABNORMAL HIGH (ref 11.5–15.5)
WBC: 9.8 10*3/uL (ref 4.0–10.5)
nRBC: 0 % (ref 0.0–0.2)

## 2021-05-14 LAB — FERRITIN: Ferritin: 40 ng/mL (ref 24–336)

## 2021-05-14 NOTE — Telephone Encounter (Signed)
[facility] FYI 10.1.2022....   Caller states he has sinus pain and pressure and tinnitus. He finished Amoxicillin 05/09/21.   Advised to go to PCP or [facility] within 4 hrs. Patient decided to go to the ED now.

## 2021-05-14 NOTE — Telephone Encounter (Signed)
See below

## 2021-05-14 NOTE — Progress Notes (Signed)
Virtual virtual Phone Note  I connected with Alex [name] on 05/14/2021 at 3:00 pm EST by phone-enabled virtual visit. I verified that I am speaking with the correct person using  two identifiers.I discussed the limitations of evaluation and management by telemedicine and the availability of in person appointments. The patient expressed understanding and agreed to proceed.    LOCATION: Patient: Home Provider: Home office  History of Present Illness:  Pt was referred by Dr. Adele Schilder for OP therapy for bipolar disorder and anxiety.    Observations/Objective: Patient presented for today's session on time and was alert, oriented x5, with no evidence or self-report of SI/HI or A/V H.  Patient reported ongoing compliance with medication and denied any use of alcohol or illicit substances.  Clinician inquired about patient's current emotional ratings, as well as any significant changes in thoughts, feelings or behavior since previous session.  Patient reported scores of 8/10 for depression, 8/10 for anxiety, 4/10 or anger/irritability. Pt discusses his physical and emotional pain and coping skills used. Pt reports he has back surgery Monday and has fear and anxiety. [name] used CBT to assist pt in managing his fear,  helping to gradually change the interconnectedness of thoughts, beliefs, feelings, and behaviors, counteracting thought patterns. Patient was encouraged to counteract thought patterns including fear.    Plan: Codependent No More  Follow Up Instructions:  I discussed the assessment and treatment plan with the patient. The patient was provided an opportunity to ask questions and all were answered. The patient agreed with the plan and demonstrated an understanding of the instructions.   The patient was advised to call back or seek an in-person evaluation if the symptoms worsen or if the condition fails to improve as anticipated.  I provided 45 minutes of non-face-to-face time during this  encounter.   Concettina Leth S, LCAS

## 2021-05-15 LAB — IGG, IGA, IGM
IgA: 70 mg/dL (ref 61–437)
IgG (Immunoglobin G), Serum: 820 mg/dL (ref 603–1613)
IgM (Immunoglobulin M), Srm: 19 mg/dL — ABNORMAL LOW (ref 20–172)

## 2021-05-16 ENCOUNTER — Ambulatory Visit (INDEPENDENT_AMBULATORY_CARE_PROVIDER_SITE_OTHER): Payer: No Typology Code available for payment source | Admitting: Internal Medicine

## 2021-05-16 ENCOUNTER — Other Ambulatory Visit: Payer: Self-pay

## 2021-05-16 ENCOUNTER — Encounter: Payer: Self-pay | Admitting: Internal Medicine

## 2021-05-16 VITALS — BP 152/90 | HR 82 | Temp 98.2°F | Ht 72.0 in | Wt 180.2 lb

## 2021-05-16 DIAGNOSIS — M5442 Lumbago with sciatica, left side: Secondary | ICD-10-CM

## 2021-05-16 DIAGNOSIS — Z0000 Encounter for general adult medical examination without abnormal findings: Secondary | ICD-10-CM | POA: Diagnosis not present

## 2021-05-16 DIAGNOSIS — M5441 Lumbago with sciatica, right side: Secondary | ICD-10-CM | POA: Diagnosis not present

## 2021-05-16 DIAGNOSIS — Z23 Encounter for immunization: Secondary | ICD-10-CM

## 2021-05-16 DIAGNOSIS — G8929 Other chronic pain: Secondary | ICD-10-CM | POA: Diagnosis not present

## 2021-05-16 MED ORDER — HIBICLENS 4 % EX LIQD: Freq: Every day | CUTANEOUS | 0 refills | Status: DC | PRN

## 2021-05-16 MED ORDER — MUPIROCIN 2 % EX OINT: TOPICAL_OINTMENT | Freq: Four times a day (QID) | CUTANEOUS | 0 refills | Status: DC

## 2021-05-16 NOTE — Assessment & Plan Note (Addendum)
LBP - LS fusion pending next week (Pain is 8-10 out of 10 w/standing) MRSA (+) PCR - Bactroban, Hibiclense

## 2021-05-16 NOTE — Assessment & Plan Note (Addendum)
  We discussed age appropriate health related issues, including available/recomended screening tests and vaccinations. Labs were ordered to be later reviewed . All questions were answered. We discussed one or more of the following - seat belt use, use of sunscreen/sun exposure exercise, fall risk reduction, second hand smoke exposure, firearm use and storage, seat belt use, a need for adhering to healthy diet and exercise. Labs were ordered.  All questions were answered. MRSA (+) PCR - Bactroban, Hibiclense

## 2021-05-16 NOTE — Progress Notes (Signed)
Subjective:  Patient ID: Alex [name], male    DOB: 1955/12/25  Age: 65 y.o. MRN: [id number]  CC: Annual Exam   HPI Alex [name] presents for a well exam C/o LBP - LS fusion pending next week Pain is 8-10 out of 10 w/standing   Outpatient Medications Prior to Visit  Medication Sig Dispense Refill   amLODipine (NORVASC) 10 MG tablet Take 1 tablet (10 mg total) by mouth daily. 90 tablet 3   amoxicillin-clavulanate (AUGMENTIN) 875-125 MG tablet Take 1 tablet by mouth every 12 (twelve) hours. 14 tablet 0   atorvastatin (LIPITOR) 40 MG tablet Take 1 tablet (40 mg total) by mouth daily. 90 tablet 3   AXIRON 30 MG/ACT SOLN Place 2 Act onto the skin daily.     Coenzyme Q10 (CO Q 10 PO) Take 1 capsule by mouth daily.      gabapentin (NEURONTIN) 100 MG capsule Take 1-3 capsules (100-300 mg total) by mouth 2 (two) times daily as needed. Ringing in the ears 120 capsule 3   Immune Globulin, Human, 4 GM/20ML SOLN Inject into the skin once a week. wednesday     LamoTRIgine 300 MG TB24 24 hour tablet Take 1 tablet (300 mg total) by mouth every morning. 90 tablet 0   latanoprost (XALATAN) 0.005 % ophthalmic solution 1 DROP EACH EYE AT NIGHT     levothyroxine (SYNTHROID) 150 MCG tablet Take 1 tablet (150 mcg total) by mouth daily before breakfast. Annual appt due in July must see provider for future refills 90 tablet 0   LORazepam (ATIVAN) 0.5 MG tablet Take 1 tablet (0.5 mg total) by mouth 2 (two) times daily. 60 tablet 1   LUTEIN PO Take 1 tablet by mouth daily.      LYCOPENE PO Take by mouth.     meloxicam (MOBIC) 15 MG tablet TAKE 1 TABLET BY MOUTH EVERY DAY AS NEEDED 90 tablet 0   Multiple Vitamin (MULTI-VITAMINS) TABS Take by mouth.     OMEGA-3 KRILL OIL PO Take 1 capsule by mouth daily.      omeprazole (PRILOSEC) 20 MG capsule Take 20 mg by mouth every evening.     sildenafil (REVATIO) 20 MG tablet Take 1 tablet by mouth once daily 30 tablet 3   tadalafil (CIALIS) 20 MG tablet TAKE 1  TABLET BY MOUTH ONCE DAILY AS NEEDED FOR ERECTILE DYSFUNCTION (Patient taking differently: 5 mg.) 30 tablet 2   tamsulosin (FLOMAX) 0.4 MG CAPS capsule TAKE 1 CAPSULE BY MOUTH EVERY DAY (Patient taking differently: as needed.) 90 capsule 1   telmisartan (MICARDIS) 80 MG tablet TAKE 1 TABLET BY MOUTH EVERY DAY 90 tablet 1   timolol (TIMOPTIC) 0.5 % ophthalmic solution timolol maleate 0.5 % eye drops  INSTILL 1 DROP INTO RIGHT EYE TWICE A DAY     zolpidem (AMBIEN) 10 MG tablet TAKE 1/2 (ONE-HALF) TABLET BY MOUTH AT BEDTIME AND  TAKE  THE  OTHER  HALF  IF  NEEDED 30 tablet 0   aspirin EC 81 MG tablet Take 1 tablet by mouth daily. (Patient not taking: No sig reported)     No facility-administered medications prior to visit.    ROS: Review of Systems  Constitutional:  Negative for appetite change, fatigue and unexpected weight change.  HENT:  Positive for congestion and voice change. Negative for nosebleeds, sneezing, sore throat and trouble swallowing.   Eyes:  Negative for itching and visual disturbance.  Respiratory:  Negative for cough.   Cardiovascular:  Negative for chest pain, palpitations and leg swelling.  Gastrointestinal:  Negative for abdominal distention, blood in stool, diarrhea and nausea.  Genitourinary:  Negative for frequency and hematuria.  Musculoskeletal:  Positive for back pain and gait problem. Negative for joint swelling and neck pain.  Skin:  Negative for rash.  Neurological:  Negative for dizziness, tremors, speech difficulty and weakness.  Psychiatric/Behavioral:  Positive for dysphoric mood. Negative for agitation, sleep disturbance and suicidal ideas. The patient is nervous/anxious.    Objective:  BP (!) 152/90 (BP Location: Left Arm)   Pulse 82   Temp 98.2 F (36.8 C) (Oral)   Ht 6' (1.829 m)   Wt 180 lb 3.2 oz (81.7 kg)   SpO2 98%   BMI 24.44 kg/m   BP Readings from Last 3 Encounters:  05/16/21 (!) 152/90  05/12/21 (!) 161/100  04/24/21 118/82    Wt  Readings from Last 3 Encounters:  05/16/21 180 lb 3.2 oz (81.7 kg)  04/24/21 181 lb (82.1 kg)  03/02/21 181 lb (82.1 kg)    Physical Exam Constitutional:      General: He is not in acute distress.    Appearance: He is well-developed.     Comments: NAD  Eyes:     Conjunctiva/sclera: Conjunctivae normal.     Pupils: Pupils are equal, round, and reactive to light.  Neck:     Thyroid: No thyromegaly.     Vascular: No JVD.  Cardiovascular:     Rate and Rhythm: Normal rate and regular rhythm.     Heart sounds: Normal heart sounds. No murmur heard.   No friction rub. No gallop.  Pulmonary:     Effort: Pulmonary effort is normal. No respiratory distress.     Breath sounds: Normal breath sounds. No wheezing or rales.  Chest:     Chest wall: No tenderness.  Abdominal:     General: Bowel sounds are normal. There is no distension.     Palpations: Abdomen is soft. There is no mass.     Tenderness: There is no abdominal tenderness. There is no guarding or rebound.  Musculoskeletal:        General: No tenderness. Normal range of motion.     Cervical back: Normal range of motion.  Lymphadenopathy:     Cervical: No cervical adenopathy.  Skin:    General: Skin is warm and dry.     Findings: No rash.  Neurological:     Mental Status: He is alert and oriented to person, place, and time.     Cranial Nerves: No cranial nerve deficit.     Motor: No abnormal muscle tone.     Coordination: Coordination normal.     Gait: Gait abnormal.     Deep Tendon Reflexes: Reflexes are normal and symmetric.  Psychiatric:        Behavior: Behavior normal.        Thought Content: Thought content normal.        Judgment: Judgment normal.  LS - tender w/ender w/ROM  Lab Results  Component Value Date   WBC 9.8 05/14/2021   HGB 16.5 05/14/2021   HCT 48.9 05/14/2021   PLT 207 05/14/2021   GLUCOSE 95 05/14/2021   CHOL 180 10/30/2020   TRIG 98 10/30/2020   HDL 84 10/30/2020   LDLDIRECT 59.0 02/02/2016    LDLCALC 79 10/30/2020   ALT 41 05/14/2021   AST 36 05/14/2021   NA 140 05/14/2021   K 4.3 05/14/2021   CL 108  05/14/2021   CREATININE 1.00 05/14/2021   BUN 20 05/14/2021   CO2 23 05/14/2021   TSH 3.84 04/24/2021   PSA 2.59 02/22/2020   INR 1.0 09/13/2019   HGBA1C 5.5 09/29/2018    No results found.  Assessment & Plan:   Problem List Items Addressed This Visit     Lumbago    LBP - LS fusion pending next week (Pain is 8-10 out of 10 w/standing) MRSA (+) PCR - Bactroban, Hibiclense       Well adult exam     We discussed age appropriate health related issues, including available/recomended screening tests and vaccinations. Labs were ordered to be later reviewed . All questions were answered. We discussed one or more of the following - seat belt use, use of sunscreen/sun exposure exercise, fall risk reduction, second hand smoke exposure, firearm use and storage, seat belt use, a need for adhering to healthy diet and exercise. Labs were ordered.  All questions were answered. MRSA (+) PCR - Bactroban, Hibiclense        Other Visit Diagnoses     Needs flu shot    -  Primary   Relevant Orders   Flu Vaccine QUAD High Dose(Fluad) (Completed)         Follow-up: Return in about 3 months (around 08/16/2021) for a follow-up visit.  Walker Kehr, MD

## 2021-05-24 ENCOUNTER — Other Ambulatory Visit: Payer: Self-pay | Admitting: *Deleted

## 2021-05-24 ENCOUNTER — Telehealth: Payer: Self-pay | Admitting: Internal Medicine

## 2021-05-24 MED ORDER — SILDENAFIL CITRATE 20 MG PO TABS: ORAL_TABLET | ORAL | 3 refills | Status: DC

## 2021-05-24 NOTE — Telephone Encounter (Signed)
Home Health verbal orders-caller/Agency:   Callback number:   Requesting OT/PT/Skilled nursing/Social Work/Speech:  Reason:  Frequency:

## 2021-05-28 ENCOUNTER — Other Ambulatory Visit (HOSPITAL_COMMUNITY): Payer: Self-pay | Admitting: Psychiatry

## 2021-05-28 ENCOUNTER — Telehealth: Payer: Self-pay

## 2021-05-28 ENCOUNTER — Telehealth: Payer: Self-pay | Admitting: Internal Medicine

## 2021-05-28 ENCOUNTER — Ambulatory Visit (HOSPITAL_COMMUNITY): Payer: No Typology Code available for payment source | Admitting: Licensed Clinical Social Worker

## 2021-05-28 ENCOUNTER — Telehealth (HOSPITAL_COMMUNITY): Payer: Self-pay

## 2021-05-28 DIAGNOSIS — F411 Generalized anxiety disorder: Secondary | ICD-10-CM

## 2021-05-28 NOTE — Telephone Encounter (Signed)
error 

## 2021-05-28 NOTE — Telephone Encounter (Signed)
Alex [name] [phone] [facility]  Need verbal order PT for strengthening 1wk, 6

## 2021-05-28 NOTE — Telephone Encounter (Signed)
Transition Care Management Follow-up Telephone Call Date of discharge and from where: 05/24/2021  [facility] How have you been since you were released from the hospital? "Still having a lot of pain" Any questions or concerns? Yes  Reports he called MD about his pain and was told that it was normal  Items Reviewed: Did the pt receive and understand the discharge instructions provided? Yes  Medications obtained and verified? Yes  Other? No  Any new allergies since your discharge? No  Dietary orders reviewed? Yes Do you have support at home? Yes   Home Care and Equipment/Supplies: Were home health services ordered? yes If so, what is the name of the agency?  Has the agency set up a time to come to the patient's home? Patient reports that he has declined OT Were any new equipment or medical supplies ordered?  yes What is the name of the medical supply agency?  Were you able to get the supplies/equipment?  Do you have any questions related to the use of the equipment or supplies?   Functional Questionnaire: (I = Independent and D = Dependent) ADLs: I  Bathing/Dressing- ASSISTED BY WIFE  Meal Prep- I  Eating- I  Maintaining continence- I  Transferring/Ambulation- I  Managing Meds- I  Follow up appointments reviewed:  PCP Hospital f/u appt confirmed? No   Specialist Hospital f/u appt confirmed?  Scheduled to see Surgeon in 4 weeks no appointment yet. Are transportation arrangements needed? No  If their condition worsens, is the pt aware to call PCP or go to the Emergency Dept.? Yes Was the patient provided with contact information for the PCP's office or ED? Yes Was to pt encouraged to call back with questions or concerns? Yes  Tomasa Rand, RN, BSN, CEN [facility] [employer] [phone]

## 2021-05-28 NOTE — Telephone Encounter (Signed)
Okay.  Thanks.

## 2021-05-29 ENCOUNTER — Encounter (HOSPITAL_COMMUNITY): Payer: Self-pay | Admitting: Licensed Clinical Social Worker

## 2021-05-29 ENCOUNTER — Other Ambulatory Visit: Payer: Self-pay

## 2021-05-29 ENCOUNTER — Ambulatory Visit (INDEPENDENT_AMBULATORY_CARE_PROVIDER_SITE_OTHER): Payer: No Typology Code available for payment source | Admitting: Licensed Clinical Social Worker

## 2021-05-29 DIAGNOSIS — F319 Bipolar disorder, unspecified: Secondary | ICD-10-CM

## 2021-05-29 NOTE — Progress Notes (Signed)
Virtual virtual Phone Note  I connected with Kasin Tonkinson Mungin on 05/29/2021 at 3:00 pm EST by phone-enabled virtual visit. I verified that I am speaking with the correct person using  two identifiers.I discussed the limitations of evaluation and management by telemedicine and the availability of in person appointments. The patient expressed understanding and agreed to proceed.    LOCATION: Patient: Home Provider: Home office  History of Present Illness:  Pt was referred by Dr. Adele Schilder for OP therapy for bipolar disorder and anxiety.    Observations/Objective: Patient presented for today's session on time and was alert, oriented x5, with no evidence or self-report of SI/HI or A/V H.  Patient reported ongoing compliance with medication and denied any use of alcohol or illicit substances.  Clinician inquired about patient's current emotional ratings, as well as any significant changes in thoughts, feelings or behavior since previous session.  Patient reported scores of 8/10 for depression, 8/10 for anxiety, 6/10 or anger/irritability. Pt just had major back surgery and was in a considerable amount of pain. Pt discussed his pain 10/10, pain meds, sleep issues, lack of support from family. Clinician allowed space for further processing of emotions and provided supportive statements throughout the session. Patient was encouraged to identify positives in his/her life to improve mood. Pt requested to end session early due to pain.    Plan: Codependent No More  Follow Up Instructions:  I discussed the assessment and treatment plan with the patient. The patient was provided an opportunity to ask questions and all were answered. The patient agreed with the plan and demonstrated an understanding of the instructions.   The patient was advised to call back or seek an in-person evaluation if the symptoms worsen or if the condition fails to improve as anticipated.  I provided 40 minutes of non-face-to-face  time during this encounter.   Bayani Renteria S, LCAS

## 2021-05-29 NOTE — Telephone Encounter (Signed)
Called Mitzi Hansen there was no answer LMOM w/MD response.Marland KitchenJohny Chess

## 2021-05-31 ENCOUNTER — Encounter (HOSPITAL_COMMUNITY): Payer: Self-pay | Admitting: Psychiatry

## 2021-05-31 ENCOUNTER — Other Ambulatory Visit: Payer: Self-pay

## 2021-05-31 ENCOUNTER — Telehealth (HOSPITAL_BASED_OUTPATIENT_CLINIC_OR_DEPARTMENT_OTHER): Payer: No Typology Code available for payment source | Admitting: Psychiatry

## 2021-05-31 DIAGNOSIS — F319 Bipolar disorder, unspecified: Secondary | ICD-10-CM

## 2021-05-31 DIAGNOSIS — F411 Generalized anxiety disorder: Secondary | ICD-10-CM | POA: Diagnosis not present

## 2021-05-31 MED ORDER — ZOLPIDEM TARTRATE 10 MG PO TABS: ORAL_TABLET | ORAL | 0 refills | Status: DC

## 2021-05-31 MED ORDER — LAMOTRIGINE ER 300 MG PO TB24: 1.0000 | ORAL_TABLET | ORAL | 0 refills | Status: DC

## 2021-05-31 MED ORDER — LORAZEPAM 0.5 MG PO TABS: 0.5000 mg | ORAL_TABLET | Freq: Every day | ORAL | 0 refills | Status: DC | PRN

## 2021-05-31 NOTE — Progress Notes (Signed)
Virtual Visit via Telephone Note  I connected with Alex [name] on 05/31/21 at  2:40 PM EDT by telephone and verified that I am speaking with the correct person using two identifiers.  Location: Patient: Home Provider: Office   I discussed the limitations, risks, security and privacy concerns of performing an evaluation and management service by telephone and the availability of in person appointments. I also discussed with the patient that there may be a patient responsible charge related to this service. The patient expressed understanding and agreed to proceed.   History of Present Illness: Patient is evaluated by phone session.  Patient recently had a back surgery with spinal fusion and he is slowly and gradually recovering from procedure.  He is taking narcotic pain medication and cut down his Ambien and Ativan.  He is still in a lot of pain but understands it will take some time to recover.  Overall he feels his mood is stable and he denies any mania, psychosis, hallucination.  He is excited about his son who is getting married on 10th and hoping that he will able to attend the wedding as pain may be subsided at that time.  He is in therapy with [name].  Energy level is low but denies any feelings of hopelessness.  He denies any major panic attack.  He does not feel as anxious or overwhelmed since taking the medication regularly.  He has no tremors, shakes or any EPS.    Past Psychiatric History: Reviewed. H/O depression, mood swing, anger and Inpatient at [facility] due to suicidal thoughts but no attempt.  No h/o psychosis but h/o poor impulse control.  Tried Klonopin but did not work.  Took Seroquel, Cymbalta and Remeron (increase depression)  Psychiatric Specialty Exam: Physical Exam  Review of Systems  Musculoskeletal:  Positive for back pain.   Weight 180 lb (81.6 kg).There is no height or weight on file to calculate BMI.  General Appearance: NA  Eye Contact:  NA  Speech:  Clear and  Coherent  Volume:  Normal  Mood:  Euthymic  Affect:  NA  Thought Process:  Goal Directed  Orientation:  Full (Time, Place, and Person)  Thought Content:  WDL  Suicidal Thoughts:  No  Homicidal Thoughts:  No  Memory:  Immediate;   Good Recent;   Good Remote;   Good  Judgement:  Good  Insight:  Present  Psychomotor Activity:  NA  Concentration:  Concentration: Fair and Attention Span: Fair  Recall:  Good  Fund of Knowledge:  Good  Language:  Good  Akathisia:  No  Handed:  Right  AIMS (if indicated):     Assets:  Communication Skills Desire for Improvement Housing Resilience  ADL's:  Intact  Cognition:  WNL  Sleep:   fair      Assessment and Plan: Bipolar disorder type I.  Generalized anxiety disorder.  Patient recently had back procedure and taking narcotic pain medication.  She is not taking Ativan and Ambien since on pain medicine but hoping once he stopped the pain medicine he will resume taking Ambien 5 mg at night and Ativan 0.5 mg as needed.  We will continue Lamictal 300 mg daily.  Discussed medication side effects and benefits.  Encouraged to continue therapy with [name].  Follow up in 3 months.  Follow Up Instructions:    I discussed the assessment and treatment plan with the patient. The patient was provided an opportunity to ask questions and all were answered. The patient agreed  with the plan and demonstrated an understanding of the instructions.   The patient was advised to call back or seek an in-person evaluation if the symptoms worsen or if the condition fails to improve as anticipated.  I provided 19 minutes of non-face-to-face time during this encounter.   Kathlee Nations, MD

## 2021-06-04 ENCOUNTER — Other Ambulatory Visit: Payer: Self-pay

## 2021-06-04 ENCOUNTER — Ambulatory Visit (INDEPENDENT_AMBULATORY_CARE_PROVIDER_SITE_OTHER): Payer: No Typology Code available for payment source | Admitting: Licensed Clinical Social Worker

## 2021-06-04 ENCOUNTER — Encounter (HOSPITAL_COMMUNITY): Payer: Self-pay | Admitting: Licensed Clinical Social Worker

## 2021-06-04 DIAGNOSIS — F319 Bipolar disorder, unspecified: Secondary | ICD-10-CM

## 2021-06-04 NOTE — Progress Notes (Signed)
Virtual virtual Phone Note  I connected with Alex [name] on 06/04/2021 at 2:00 pm EST by phone-enabled virtual visit. I verified that I am speaking with the correct person using  two identifiers.I discussed the limitations of evaluation and management by telemedicine and the availability of in person appointments. The patient expressed understanding and agreed to proceed.    LOCATION: Patient: Home Provider: Home office  History of Present Illness:  Pt was referred by Dr. Adele Schilder for OP therapy for bipolar disorder and anxiety.    Observations/Objective: Patient presented for today's session on time and was alert, oriented x5, with no evidence or self-report of SI/HI or A/V H.  Patient reported ongoing compliance with medication and denied any use of alcohol or illicit substances.  Clinician inquired about patient's current emotional ratings, as well as any significant changes in thoughts, feelings or behavior since previous session.  Patient reported scores of 8/10 for depression, 8/10 for anxiety, 6/10 or anger/irritability. Pt continues to heal from his major back surgery and continues to have a considerable amount of pain. Pt discussed his pain 10/10, pain meds, sleep issues, lack of support from family. Pt had a tele-visit with his psychiatrist, Dr. Adele Schilder, who did not change any medications. [name] and pt explored his healing process and family expectations. "Unmet expectations lead to disappointments." [name] and pt explored his other health issues. [name] trained patient on changing his brain to the positive, from the negative, using gratitudes.     Follow Up Instructions:  I discussed the assessment and treatment plan with the patient. The patient was provided an opportunity to ask questions and all were answered. The patient agreed with the plan and demonstrated an understanding of the instructions.   The patient was advised to call back or seek an in-person evaluation if the symptoms worsen  or if the condition fails to improve as anticipated.  I provided 60 minutes of non-face-to-face time during this encounter.   Fermina Mishkin S, LCAS

## 2021-06-11 ENCOUNTER — Encounter (HOSPITAL_COMMUNITY): Payer: Self-pay | Admitting: Licensed Clinical Social Worker

## 2021-06-11 ENCOUNTER — Other Ambulatory Visit: Payer: Self-pay

## 2021-06-11 ENCOUNTER — Ambulatory Visit (INDEPENDENT_AMBULATORY_CARE_PROVIDER_SITE_OTHER): Payer: No Typology Code available for payment source | Admitting: Licensed Clinical Social Worker

## 2021-06-11 DIAGNOSIS — F319 Bipolar disorder, unspecified: Secondary | ICD-10-CM

## 2021-06-11 NOTE — Progress Notes (Signed)
Virtual virtual Phone Note  I connected with Kael Keetch Guadarrama on 06/11/2021 at 2:00 pm EST by phone-enabled virtual visit. I verified that I am speaking with the correct person using  two identifiers.I discussed the limitations of evaluation and management by telemedicine and the availability of in person appointments. The patient expressed understanding and agreed to proceed.    LOCATION: Patient: Home Provider: Home office  History of Present Illness:  Pt was referred by Dr. Adele Schilder for OP therapy for bipolar disorder and anxiety.    Observations/Objective: Patient presented for today's session on time and was alert, oriented x5, with no evidence or self-report of SI/HI or A/V H.  Patient reported ongoing compliance with medication and denied any use of alcohol or illicit substances.  Clinician inquired about patient's current emotional ratings, as well as any significant changes in thoughts, feelings or behavior since previous session.  Patient reported scores of 8/10 for depression, 8/10 for anxiety, 6/10 or anger/irritability. Pt continues to heal from his major back surgery and continues to have a considerable amount of pain. Pt discussed his pain 7/10, pain meds, sleep issues, lack of support from family. [name] and pt explored his healing process and family expectations.  Pt  was negative about his quality of life.  [name] and pt explored "What are you willing to do different to improve your quality of life." [name] and pt explored alternatives to improve his quality of life.    Follow Up Instructions:  I discussed the assessment and treatment plan with the patient. The patient was provided an opportunity to ask questions and all were answered. The patient agreed with the plan and demonstrated an understanding of the instructions.   The patient was advised to call back or seek an in-person evaluation if the symptoms worsen or if the condition fails to improve as anticipated.  I provided 60  minutes of non-face-to-face time during this encounter.   Ger Ringenberg S, LCAS

## 2021-06-14 ENCOUNTER — Other Ambulatory Visit: Payer: Self-pay

## 2021-06-14 ENCOUNTER — Encounter: Payer: Self-pay | Admitting: Internal Medicine

## 2021-06-14 ENCOUNTER — Ambulatory Visit (INDEPENDENT_AMBULATORY_CARE_PROVIDER_SITE_OTHER): Payer: No Typology Code available for payment source | Admitting: Internal Medicine

## 2021-06-14 VITALS — BP 122/84 | HR 89 | Temp 98.7°F | Wt 177.6 lb

## 2021-06-14 DIAGNOSIS — H9209 Otalgia, unspecified ear: Secondary | ICD-10-CM

## 2021-06-14 DIAGNOSIS — M5441 Lumbago with sciatica, right side: Secondary | ICD-10-CM

## 2021-06-14 DIAGNOSIS — E291 Testicular hypofunction: Secondary | ICD-10-CM

## 2021-06-14 DIAGNOSIS — M5442 Lumbago with sciatica, left side: Secondary | ICD-10-CM

## 2021-06-14 DIAGNOSIS — D649 Anemia, unspecified: Secondary | ICD-10-CM | POA: Diagnosis not present

## 2021-06-14 DIAGNOSIS — G8929 Other chronic pain: Secondary | ICD-10-CM

## 2021-06-14 LAB — CBC WITH DIFFERENTIAL/PLATELET
Basophils Absolute: 0.1 10*3/uL (ref 0.0–0.1)
Basophils Relative: 0.8 % (ref 0.0–3.0)
Eosinophils Absolute: 0.4 10*3/uL (ref 0.0–0.7)
Eosinophils Relative: 3.9 % (ref 0.0–5.0)
HCT: 41 % (ref 39.0–52.0)
Hemoglobin: 13.2 g/dL (ref 13.0–17.0)
Lymphocytes Relative: 11.5 % — ABNORMAL LOW (ref 12.0–46.0)
Lymphs Abs: 1.2 10*3/uL (ref 0.7–4.0)
MCHC: 32.1 g/dL (ref 30.0–36.0)
MCV: 81.5 fl (ref 78.0–100.0)
Monocytes Absolute: 1 10*3/uL (ref 0.1–1.0)
Monocytes Relative: 9.8 % (ref 3.0–12.0)
Neutro Abs: 7.6 10*3/uL (ref 1.4–7.7)
Neutrophils Relative %: 74 % (ref 43.0–77.0)
Platelets: 354 10*3/uL (ref 150.0–400.0)
RBC: 5.03 Mil/uL (ref 4.22–5.81)
RDW: 15.9 % — ABNORMAL HIGH (ref 11.5–15.5)
WBC: 10.3 10*3/uL (ref 4.0–10.5)

## 2021-06-14 LAB — PSA: PSA: 5.19 ng/mL — ABNORMAL HIGH (ref 0.10–4.00)

## 2021-06-14 MED ORDER — NEOMYCIN-POLYMYXIN-HC 3.5-10000-1 OT SOLN: 3.0000 [drp] | Freq: Three times a day (TID) | OTIC | 3 refills | Status: AC

## 2021-06-14 NOTE — Progress Notes (Signed)
Subjective:  Patient ID: Alex [name], male    DOB: 1956/04/04  Age: 65 y.o. MRN: [id number]  CC: Otalgia (Left ear, also having some discharge)   HPI Alex [name] [facility] presents for post-LS fusion x5 vertebrae on 05/21/21. Pain is better C/o R>L earache and d/c a few days C/o post-op anemia  Outpatient Medications Prior to Visit  Medication Sig Dispense Refill   amoxicillin-clavulanate (AUGMENTIN) 875-125 MG tablet Take 1 tablet by mouth every 12 (twelve) hours. 14 tablet 0   atorvastatin (LIPITOR) 40 MG tablet Take 1 tablet (40 mg total) by mouth daily. 90 tablet 3   AXIRON 30 MG/ACT SOLN Place 2 Act onto the skin daily.     chlorhexidine (HIBICLENS) 4 % external liquid Apply topically daily as needed. Clean hands, skin in the shower 1 night prior 500 mL 0   Coenzyme Q10 (CO Q 10 PO) Take 1 capsule by mouth daily.      gabapentin (NEURONTIN) 100 MG capsule Take 1-3 capsules (100-300 mg total) by mouth 2 (two) times daily as needed. Ringing in the ears 120 capsule 3   Immune Globulin, Human, 4 GM/20ML SOLN Inject into the skin once a week. wednesday     LamoTRIgine 300 MG TB24 24 hour tablet Take 1 tablet (300 mg total) by mouth every morning. 90 tablet 0   latanoprost (XALATAN) 0.005 % ophthalmic solution 1 DROP EACH EYE AT NIGHT     levothyroxine (SYNTHROID) 150 MCG tablet Take 1 tablet (150 mcg total) by mouth daily before breakfast. Annual appt due in July must see provider for future refills 90 tablet 0   LORazepam (ATIVAN) 0.5 MG tablet Take 1 tablet (0.5 mg total) by mouth daily as needed for anxiety. 30 tablet 0   LUTEIN PO Take 1 tablet by mouth daily.      LYCOPENE PO Take by mouth.     meloxicam (MOBIC) 15 MG tablet TAKE 1 TABLET BY MOUTH EVERY DAY AS NEEDED 90 tablet 0   Multiple Vitamin (MULTI-VITAMINS) TABS Take by mouth.     mupirocin ointment (BACTROBAN) 2 % Apply topically 4 (four) times daily. As directed 30 g 0   OMEGA-3 KRILL OIL PO Take 1 capsule by mouth daily.       omeprazole (PRILOSEC) 20 MG capsule Take 20 mg by mouth every evening.     sildenafil (REVATIO) 20 MG tablet Take 1 tablet by mouth once daily 30 tablet 3   tadalafil (CIALIS) 20 MG tablet TAKE 1 TABLET BY MOUTH ONCE DAILY AS NEEDED FOR ERECTILE DYSFUNCTION (Patient taking differently: 5 mg.) 30 tablet 2   tamsulosin (FLOMAX) 0.4 MG CAPS capsule TAKE 1 CAPSULE BY MOUTH EVERY DAY (Patient taking differently: as needed.) 90 capsule 1   telmisartan (MICARDIS) 80 MG tablet TAKE 1 TABLET BY MOUTH EVERY DAY 90 tablet 1   timolol (TIMOPTIC) 0.5 % ophthalmic solution timolol maleate 0.5 % eye drops  INSTILL 1 DROP INTO RIGHT EYE TWICE A DAY     zolpidem (AMBIEN) 10 MG tablet TAKE 1/2 (ONE-HALF) TABLET BY MOUTH AT BEDTIME AND  TAKE  THE  OTHER  HALF  IF  NEEDED 30 tablet 0   amLODipine (NORVASC) 10 MG tablet Take 1 tablet (10 mg total) by mouth daily. 90 tablet 3   aspirin EC 81 MG tablet Take 1 tablet by mouth daily. (Patient not taking: Reported on 06/14/2021)     No facility-administered medications prior to visit.    ROS: Review of Systems  Constitutional:  Negative for appetite change, fatigue, fever and unexpected weight change.  HENT:  Positive for congestion and ear pain. Negative for nosebleeds, sneezing, sore throat and trouble swallowing.   Eyes:  Negative for itching and visual disturbance.  Respiratory:  Negative for cough.   Cardiovascular:  Negative for chest pain, palpitations and leg swelling.  Gastrointestinal:  Negative for abdominal distention, blood in stool, diarrhea and nausea.  Genitourinary:  Negative for frequency and hematuria.  Musculoskeletal:  Positive for back pain. Negative for gait problem, joint swelling and neck pain.  Skin:  Negative for rash.  Neurological:  Negative for dizziness, tremors, speech difficulty and weakness.  Psychiatric/Behavioral:  Negative for agitation, dysphoric mood, sleep disturbance and suicidal ideas. The patient is not  nervous/anxious.    Objective:  BP 122/84 (BP Location: Left Arm)   Pulse 89   Temp 98.7 F (37.1 C) (Oral)   Wt 177 lb 9.6 oz (80.6 kg)   SpO2 97%   BMI 24.09 kg/m   BP Readings from Last 3 Encounters:  06/14/21 122/84  05/16/21 (!) 152/90  05/12/21 (!) 161/100    Wt Readings from Last 3 Encounters:  06/14/21 177 lb 9.6 oz (80.6 kg)  05/16/21 180 lb 3.2 oz (81.7 kg)  04/24/21 181 lb (82.1 kg)    Physical Exam Constitutional:      General: He is not in acute distress.    Appearance: He is well-developed.     Comments: NAD  Eyes:     Conjunctiva/sclera: Conjunctivae normal.     Pupils: Pupils are equal, round, and reactive to light.  Neck:     Thyroid: No thyromegaly.     Vascular: No JVD.  Cardiovascular:     Rate and Rhythm: Normal rate and regular rhythm.     Heart sounds: Normal heart sounds. No murmur heard.   No friction rub. No gallop.  Pulmonary:     Effort: Pulmonary effort is normal. No respiratory distress.     Breath sounds: Normal breath sounds. No wheezing or rales.  Chest:     Chest wall: No tenderness.  Abdominal:     General: Bowel sounds are normal. There is no distension.     Palpations: Abdomen is soft. There is no mass.     Tenderness: There is no abdominal tenderness. There is no guarding or rebound.  Musculoskeletal:        General: Tenderness present. Normal range of motion.     Cervical back: Normal range of motion.  Lymphadenopathy:     Cervical: No cervical adenopathy.  Skin:    General: Skin is warm and dry.     Findings: No rash.  Neurological:     Mental Status: He is alert and oriented to person, place, and time.     Cranial Nerves: No cranial nerve deficit.     Motor: No abnormal muscle tone.     Coordination: Coordination normal.     Gait: Gait abnormal.     Deep Tendon Reflexes: Reflexes are normal and symmetric.  Psychiatric:        Behavior: Behavior normal.        Thought Content: Thought content normal.         Judgment: Judgment normal.   LS w/brace, clean scar Using a cane Dry blood in the L ear canal  Lab Results  Component Value Date   WBC 9.8 05/14/2021   HGB 16.5 05/14/2021   HCT 48.9 05/14/2021   PLT 207 05/14/2021  GLUCOSE 95 05/14/2021   CHOL 180 10/30/2020   TRIG 98 10/30/2020   HDL 84 10/30/2020   LDLDIRECT 59.0 02/02/2016   LDLCALC 79 10/30/2020   ALT 41 05/14/2021   AST 36 05/14/2021   NA 140 05/14/2021   K 4.3 05/14/2021   CL 108 05/14/2021   CREATININE 1.00 05/14/2021   BUN 20 05/14/2021   CO2 23 05/14/2021   TSH 3.84 04/24/2021   PSA 2.59 02/22/2020   INR 1.0 09/13/2019   HGBA1C 5.5 09/29/2018    No results found.  Assessment & Plan:   Problem List Items Addressed This Visit     Anemia    S/p post-LS fusion x5 vertebrae on 05/21/21.  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 Pain is better         Meds ordered this encounter  Medications   neomycin-polymyxin-hydrocortisone (CORTISPORIN) OTIC solution    Sig: Place 3 drops into both ears 3 (three) times daily.    Dispense:  10 mL    Refill:  3      Follow-up: No follow-ups on file.  Walker Kehr, MD

## 2021-06-14 NOTE — Assessment & Plan Note (Addendum)
Dry old blood in the L ear canal Use hydrogen peroxide Cortisporin otic if worse

## 2021-06-14 NOTE — Assessment & Plan Note (Signed)
S/p post-LS fusion x5 vertebrae on 05/21/21. Pain is better

## 2021-06-14 NOTE — Assessment & Plan Note (Signed)
S/p post-LS fusion x5 vertebrae on 05/21/21.  Check CBC

## 2021-06-18 ENCOUNTER — Encounter (HOSPITAL_COMMUNITY): Payer: Self-pay | Admitting: Licensed Clinical Social Worker

## 2021-06-18 ENCOUNTER — Ambulatory Visit (INDEPENDENT_AMBULATORY_CARE_PROVIDER_SITE_OTHER): Payer: No Typology Code available for payment source | Admitting: Licensed Clinical Social Worker

## 2021-06-18 ENCOUNTER — Other Ambulatory Visit: Payer: Self-pay

## 2021-06-18 DIAGNOSIS — F319 Bipolar disorder, unspecified: Secondary | ICD-10-CM

## 2021-06-18 NOTE — Progress Notes (Signed)
Virtual virtual Phone Note  I connected with Alex [name] on 06/18/2021 at 2:00 pm EST by phone-enabled virtual visit. I verified that I am speaking with the correct person using  two identifiers.I discussed the limitations of evaluation and management by telemedicine and the availability of in person appointments. The patient expressed understanding and agreed to proceed.    LOCATION: Patient: Home Provider: Home office  History of Present Illness:  Pt was referred by Dr. Adele Schilder for OP therapy for bipolar disorder and anxiety.    Observations/Objective: Patient presented for today's session on time and was alert, oriented x5, with no evidence or self-report of SI/HI or A/V H.  Patient reported ongoing compliance with medication and denied any use of alcohol or illicit substances.  Clinician inquired about patient's current emotional ratings, as well as any significant changes in thoughts, feelings or behavior since previous session.  Patient reported scores of 8/10 for depression, 8/10 for anxiety, 8/10 or anger/irritability. Pt continues to heal from his major back surgery and continues to have a considerable amount of pain. Pt discussed his pain 7/10, pain meds, sleep issues, lack of support from family. Pt reports his family went out of town for his son's wedding. There was some family drama from his 2 adult children that still live at home. [name] and pt worked on goal setting and problem-solving how pt and his wife can move forward in retirement.    Follow Up Instructions:  I discussed the assessment and treatment plan with the patient. The patient was provided an opportunity to ask questions and all were answered. The patient agreed with the plan and demonstrated an understanding of the instructions.   The patient was advised to call back or seek an in-person evaluation if the symptoms worsen or if the condition fails to improve as anticipated.  I provided 60 minutes of non-face-to-face  time during this encounter.   Monroe Qin S, LCAS

## 2021-06-25 ENCOUNTER — Ambulatory Visit (HOSPITAL_COMMUNITY): Payer: No Typology Code available for payment source | Admitting: Licensed Clinical Social Worker

## 2021-06-25 ENCOUNTER — Other Ambulatory Visit: Payer: Self-pay

## 2021-06-27 ENCOUNTER — Other Ambulatory Visit: Payer: Self-pay

## 2021-06-27 ENCOUNTER — Inpatient Hospital Stay: Payer: No Typology Code available for payment source | Attending: Oncology | Admitting: Oncology

## 2021-06-27 ENCOUNTER — Other Ambulatory Visit (HOSPITAL_COMMUNITY): Payer: Self-pay | Admitting: Psychiatry

## 2021-06-27 DIAGNOSIS — D509 Iron deficiency anemia, unspecified: Secondary | ICD-10-CM | POA: Insufficient documentation

## 2021-06-27 DIAGNOSIS — Z85850 Personal history of malignant neoplasm of thyroid: Secondary | ICD-10-CM | POA: Diagnosis present

## 2021-06-27 DIAGNOSIS — F319 Bipolar disorder, unspecified: Secondary | ICD-10-CM

## 2021-06-27 DIAGNOSIS — F411 Generalized anxiety disorder: Secondary | ICD-10-CM

## 2021-06-27 DIAGNOSIS — Z79899 Other long term (current) drug therapy: Secondary | ICD-10-CM | POA: Insufficient documentation

## 2021-06-27 DIAGNOSIS — Z85820 Personal history of malignant melanoma of skin: Secondary | ICD-10-CM | POA: Diagnosis not present

## 2021-06-27 DIAGNOSIS — C73 Malignant neoplasm of thyroid gland: Secondary | ICD-10-CM | POA: Diagnosis not present

## 2021-06-27 DIAGNOSIS — Z794 Long term (current) use of insulin: Secondary | ICD-10-CM | POA: Diagnosis not present

## 2021-06-27 DIAGNOSIS — Z7982 Long term (current) use of aspirin: Secondary | ICD-10-CM | POA: Diagnosis not present

## 2021-06-27 NOTE — Progress Notes (Signed)
[name]  [phone] [phone]     ID: Alex [name] DOB: [birth year]  MR#: [id number]  ZJI#:[id number]  Patient Care Team: Cassandria Anger, MD as PCP - General Stanford Breed, Denice Bors, MD as PCP - Cardiology (Cardiology) Magrinat, Virgie Dad, MD (Hematology and Oncology) Franchot Gallo, MD as Attending Physician (Urology) Jerrell Belfast, MD (Otolaryngology) Stanford Breed Denice Bors, MD (Cardiology) Laurence Spates, MD (Inactive) as Consulting Physician (Gastroenterology) Pablo Lawrence (Allergy and Immunology) Javier Glazier, MD as Consulting Physician (Pulmonary Disease) Gaye Pollack, MD as Consulting Physician (Cardiothoracic Surgery) [name], MD as Consulting Physician (Dermatology) OTHER MD:  CHIEF COMPLAINT: Periorbital squamous cell carcinoma; thyroid cancer; iron deficiency anemia  CURRENT TREATMENT: Observation   INTERVAL HISTORY: Alex [name] was scheduled for virtual visit today for follow-up for his periorbital squamous cell carcinoma, thyroid cancer, and iron deficiency anemia.  However and he came instead.  I was glad to see him in person.  Recall he was released from follow up here in 07/2018.  He requested a visit today due to recent skin biopsy showing squamous cell carcinoma.  He is followed by Dr. Denna Haggard for this.  "Anything with cancer in the head worries me".  REVIEW OF SYSTEMS: Alex [name] tells me there is significant edema in his right eye and they are trying to "save my central vision" with  veg F injections at [facility] under Dr.Fekrat.  He underwent surgery for his lower back under Dr. Patrice Paradise in [facility].  He does think that his pain is better although certainly and not gone.  His thyroid issues appear to be stable.  His most recent hemoglobin was 16.3, and months ago, though he certainly did lose blood during his surgery he says, and a pretreatment ferritin a month ago was 40.  COVID 19 VACCINATION STATUS: He  has had the Pfizer vaccine times 2+ the most recent booster.  He also had COVID in July of this year.   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   Cataract    LEFT EYE   Chronic back pain    Complication of anesthesia POST URINARY RETENTION---  2006 SHOULDER SURGERY MARKED BRADYCARDIA VAGAL RESPONSE NO ISSUE W/ SURGERY AFTER THIS ONE   WITH GENERAL ANESTHESIA, 15 YRS AGO VASOVAGAL REACTION NONE SINCE   Corneal hemorrhage 06/03/2018   Entire left eye   Coronary atherosclerosis CARDIOLOGIST- DR CRENSHAW--  LAST VISIT 01-05-2012 IN EPIC   NON-OBSTRUCTIVE MILD DISEASE   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HCC)    Depression    Epicondylitis    right elbow   GERD (gastroesophageal reflux disease)    Glaucoma BOTH EYES   RIGHT EYE RADIATION DAMAGE   Hearing loss    BOTH EARS   Hearing loss    Bilateral   Hepatic cyst    Several, The lesion of concern in segment 6 of the liver has single large portal vein  and hepatic vein branches extending to tt, in a pattern of enhancement which mirrors these vascular structures. 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 These can be seen in normal patients and also on patient's with portal venous hypertension and in this case the lesion    History of chronic prostatitis    History of deviated nasal septum    History of hiatal hernia    SMALL    History of kidney stones    History of orbital cancer 2002  RIGHT EYE SQUAMOUS CELL  S/P  MOH'S SURG AND CHEMO RADIATION---  ONCOLOIST  DR MAGRINOT  (IN REMISSION)   W/ METS TO NECK   2004  ---  S/P  NECK DISSECTION AND RADIATION   History of thyroid cancer PRIMARY (NO METS FROM ORBITAL CANCER)--   IN REMISSION   S/P TOTAL THYROIDECTOMY  , CHEMORADIATION  (ONCOLOGIST -- DR Griffith Citron)   Hyperlipidemia    Hypertension    Macular degeneration    Left   Nocturia    OA (osteoarthritis)    Pancreas cyst    Peripheral vascular disease (HCC)    THORACIC AA 3. 9 CM X 4. 3 CM PER NOV 06-14-17  CHEST CTFOLOWED BY DR Stanford Breed YEARLY FOR   Positional vertigo    HX OF WITH SINUS INFECTIONS   Radial head fracture    Right   Squamous cell carcinoma of skin 06/04/2016   in situ-crown of scalp   Squamous cell carcinoma of skin 04/22/2017   in situ-crown scalp (txpbx)   Thoracic aortic aneurysm 06/14/2017   last CT 4.1 CM Mild   Tinnitus    CONSTANT   Ulnar nerve compression    right elbow   Unsteady gait    especially with stairs, depth perception off   Urinary hesitancy    Wears glasses     PAST SURGICAL HISTORY: Past Surgical History:  Procedure Laterality Date   CARDIAC CATHETERIZATION  01-16-2006  DR Marcello Moores WALL   MILD CORONARY ATHEROSCLEROSIS/ MID TO DISTAL LAD 40% STENOSIS/ LVF 50-55%   CARPAL TUNNEL RELEASE Right 11/03/2017   Procedure: RIGHT HAND CARPAL TUNNEL RELEASE;  Surgeon: Iran Planas, MD;  Location: [facility];  Service: Orthopedics;  Laterality: Right;   CATARACT EXTRACTION Right    COLONSCOPY  2017 LAST DONE   MULTIPLE   ENDOSCOPY  LAST 2017   MULTIPLE DONE DILATION DONE ALSO   ESOPHAGOGASTRODUODENOSCOPY (EGD) WITH PROPOFOL N/A 02/24/2018   Procedure: ESOPHAGOGASTRODUODENOSCOPY (EGD) WITH PROPOFOL;  Surgeon: Laurence Spates, MD;  Location: [facility];  Service: Endoscopy;  Laterality: N/A;   EXCISION RADIAL HEAD Right 11/03/2017   Procedure: RIGHT  PROXIMAL RADIUS RADIAL HEAD RESECTION AND JOINT DEBRIDEMENT;  Surgeon: Iran Planas, MD;  Location: [facility];  Service: Orthopedics;  Laterality: Right;   EXTRACORPOREAL SHOCK WAVE LITHOTRIPSY Right 11/20/2020   Procedure: EXTRACORPOREAL SHOCK WAVE LITHOTRIPSY (ESWL);  Surgeon: Alexis Frock, MD;  Location: [facility];  Service: Urology;  Laterality: Right;  75 MINS   KNEE ARTHROSCOPY  05/01/2012   Procedure: ARTHROSCOPY KNEE;  Surgeon: Johnn Hai, MD;  Location: [facility];  Service: Orthopedics;  Laterality: Left;  debridement and removal of loose body   LEFT ANKLE ARTHROSCOPY W/ DEBRIDEMENT  05-12-2007   LEFT HYDROCELECTOMY  03-29-2005   AND REPAIR LEFT INGUINAL HERNIA W/ MESH   MOHS SURGERY  2002   RIGHT ORBITAL CANCER   NASAL ENDOSCOPY  08-07-2005   RIGHT  EPISTAXIS  / POST SEPTOPLASTY  (HX RIGHT ORBITAL CA & S/P RADIATION/ NECROSIS ANTERIOR END OF BOTH INFERIOR TURBINATES)   occuloplastic surgery  2002   PARS PLANA VITRECTOMY  11-06-2004   RIGHT EYE RADIATION RETINOPATHY W/ HEMORRHAGE   RADIAL HEAD ARTHROPLASTY Right 06/15/2018   Procedure: RIGHT ELBOW PROXIMAL RADIOULNAR JOINT DEBRIDEMENT AND ARTHROPLASTY;  Surgeon: Iran Planas, MD;  Location: [facility];  Service: Orthopedics;  Laterality: Right;  BLOCK WITH SEDATION   REPAIR UNDESENDED RIGHT TESTICLE / RIGHT INGUINAL HERNIA  AGE 38   RIGHT ANKLE ARTHROSCOPY W/ EXTENSIVE DEBRIDEMENT  04/05/2008   x2   RIGHT SHOULDER SURGERY  2006   RIGHT SUPRAOMOHYOID NECK DISSECTION   03-08-2003   ZONES 1,2,3;   SUBMANDIBULAR MASS / METASTATIC SQUAMOUS CELL CARCINOMA RIGHT NECK   SAVORY DILATION N/A 02/24/2018   Procedure: SAVORY DILATION;  Surgeon: Laurence Spates, MD;  Location: [facility];  Service: Endoscopy;  Laterality: N/A;   SEPTOPLASTY  NOV 2006   SHOULDER ARTHROSCOPY Left    SHOULDER ARTHROSCOPY W/ SUBACROMIAL DECOMPRESSION AND DISTAL CLAVICLE EXCISION  10-09-2008    AND DEBRIDEMENT OF RIGHT SHOULDER IMPINGEMENT & [facility] JOINT ARTHRITIS   SPINE SURGERY  2016   l 3 TO l 4 PLATE AND SCREWS   TOTAL THYROIDECTOMY  11-03-2001   PAPILLARY THYROID CARCINOMA   TRANSTHORACIC ECHOCARDIOGRAM  12/ 2012   grade I diastolic dysfunction/ ef 41-58%   ULNAR NERVE TRANSPOSITION Right 04/28/2014   Procedure: RIGHT ELBOW ULNA NERVE RELEASE TRANSPOSTION AND MEDIAL EPICONDYLAR DEBRIDEMENT AND REPAIR;  Surgeon: Linna Hoff, MD;  Location: [facility];  Service: Orthopedics;  Laterality: Right;    FAMILY HISTORY Family History  Problem Relation Age of Onset   Depression Sister    Rectal cancer Sister    Lung cancer Brother    Kidney disease Mother    Depression Mother    Stroke Father    COPD Father    Hypertension Father    Prostate cancer Father    Depression Daughter    Drug abuse Daughter    Psychiatric Illness Son     SOCIAL HISTORY: (Updated November 2022) [name] is disabled secondary to his multiple medical problems. His wife Marcie Bal works for [employer].  He still has 3 children at home and he has has 4 married children.  He hopes eventually to move to [name], near [place].    ADVANCED DIRECTIVES: In the absence of any documents to the contrary the patient's wife is his healthcare power of attorney.   HEALTH MAINTENANCE: Social History   Tobacco Use   Smoking status: Never   Smokeless tobacco: Never  Vaping Use   Vaping Use: Never used  Substance Use Topics   Alcohol use: Yes    Alcohol/week: 0.0 standard drinks    Comment: 1-2 glasses per month   Drug use: No     Allergies  Allergen Reactions   Percocet [Oxycodone-Acetaminophen] Itching    Can take generic.  States only has a problem with percocet brand, also headache   Vicodin [Hydrocodone-Acetaminophen]     Itching and sleep disruption, can take generic    Current Outpatient Medications  Medication Sig Dispense Refill   amLODipine (NORVASC) 10 MG tablet Take 1 tablet (10 mg  total) by mouth daily. 90 tablet 3   amoxicillin-clavulanate (AUGMENTIN) 875-125 MG tablet Take 1 tablet by mouth every 12 (twelve) hours. 14 tablet 0   aspirin EC 81 MG tablet Take 1 tablet by mouth daily. (Patient  not taking: Reported on 06/14/2021)     atorvastatin (LIPITOR) 40 MG tablet Take 1 tablet (40 mg total) by mouth daily. 90 tablet 3   AXIRON 30 MG/ACT SOLN Place 2 Act onto the skin daily.     chlorhexidine (HIBICLENS) 4 % external liquid Apply topically daily as needed. Clean hands, skin in the shower 1 night prior 500 mL 0   Coenzyme Q10 (CO Q 10 PO) Take 1 capsule by mouth daily.      gabapentin (NEURONTIN) 100 MG capsule Take 1-3 capsules (100-300 mg total) by mouth 2 (two) times daily as needed. Ringing in the ears 120 capsule 3   Immune Globulin, Human, 4 GM/20ML SOLN Inject into the skin once a week. wednesday     LamoTRIgine 300 MG TB24 24 hour tablet Take 1 tablet (300 mg total) by mouth every morning. 90 tablet 0   latanoprost (XALATAN) 0.005 % ophthalmic solution 1 DROP EACH EYE AT NIGHT     levothyroxine (SYNTHROID) 150 MCG tablet Take 1 tablet (150 mcg total) by mouth daily before breakfast. Annual appt due in July must see provider for future refills 90 tablet 0   LORazepam (ATIVAN) 0.5 MG tablet Take 1 tablet (0.5 mg total) by mouth daily as needed for anxiety. 30 tablet 0   LUTEIN PO Take 1 tablet by mouth daily.      LYCOPENE PO Take by mouth.     meloxicam (MOBIC) 15 MG tablet TAKE 1 TABLET BY MOUTH EVERY DAY AS NEEDED 90 tablet 0   Multiple Vitamin (MULTI-VITAMINS) TABS Take by mouth.     mupirocin ointment (BACTROBAN) 2 % Apply topically 4 (four) times daily. As directed 30 g 0   neomycin-polymyxin-hydrocortisone (CORTISPORIN) OTIC solution Place 3 drops into both ears 3 (three) times daily. 10 mL 3   OMEGA-3 KRILL OIL PO Take 1 capsule by mouth daily.      omeprazole (PRILOSEC) 20 MG capsule Take 20 mg by mouth every evening.     sildenafil (REVATIO) 20 MG tablet  Take 1 tablet by mouth once daily 30 tablet 3   tadalafil (CIALIS) 20 MG tablet TAKE 1 TABLET BY MOUTH ONCE DAILY AS NEEDED FOR ERECTILE DYSFUNCTION (Patient taking differently: 5 mg.) 30 tablet 2   tamsulosin (FLOMAX) 0.4 MG CAPS capsule TAKE 1 CAPSULE BY MOUTH EVERY DAY (Patient taking differently: as needed.) 90 capsule 1   telmisartan (MICARDIS) 80 MG tablet TAKE 1 TABLET BY MOUTH EVERY DAY 90 tablet 1   timolol (TIMOPTIC) 0.5 % ophthalmic solution timolol maleate 0.5 % eye drops  INSTILL 1 DROP INTO RIGHT EYE TWICE A DAY     zolpidem (AMBIEN) 10 MG tablet TAKE 1/2 (ONE-HALF) TABLET BY MOUTH AT BEDTIME AND  TAKE  THE  OTHER  HALF  IF  NEEDED 30 tablet 0   No current facility-administered medications for this visit.    OBJECTIVE: Middle-aged white man who seems better today than the last few times I have seen him.  There were no vitals filed for this visit.    There is no height or weight on file to calculate BMI.    ECOG FS:1 - Symptomatic but completely ambulatory  This was to have been a telemedicine visit so labs and vitals were not obtained  LAB RESULTS:  CMP     Component Value Date/Time   NA 140 05/14/2021 1335   NA 141 05/12/2018 1032   NA 139 06/27/2017 0950   K 4.3 05/14/2021 1335  K 4.0 06/27/2017 0950   CL 108 05/14/2021 1335   CO2 23 05/14/2021 1335   CO2 23 06/27/2017 0950   GLUCOSE 95 05/14/2021 1335   GLUCOSE 93 06/27/2017 0950   GLUCOSE 95 06/04/2006 0825   BUN 20 05/14/2021 1335   BUN 15 05/12/2018 1032   BUN 19.2 06/27/2017 0950   CREATININE 1.00 05/14/2021 1335   CREATININE 1.27 (H) 06/08/2018 1417   CREATININE 1.1 06/27/2017 0950   CALCIUM 8.6 (L) 05/14/2021 1335   CALCIUM 8.8 06/27/2017 0950   PROT 6.7 05/14/2021 1335   PROT 6.6 10/30/2020 0958   PROT 6.9 06/27/2017 0950   ALBUMIN 3.9 05/14/2021 1335   ALBUMIN 4.5 10/30/2020 0958   ALBUMIN 4.1 06/27/2017 0950   AST 36 05/14/2021 1335   AST 31 06/08/2018 1417   AST 26 06/27/2017 0950   ALT  41 05/14/2021 1335   ALT 39 06/08/2018 1417   ALT 40 06/27/2017 0950   ALKPHOS 85 05/14/2021 1335   ALKPHOS 74 06/27/2017 0950   BILITOT 0.8 05/14/2021 1335   BILITOT 0.8 10/30/2020 0958   BILITOT 0.7 06/08/2018 1417   BILITOT 1.05 06/27/2017 0950   GFRNONAA >60 05/14/2021 1335   GFRNONAA 58 (L) 06/08/2018 1417   GFRAA >60 06/08/2018 1417    INo results found for: SPEP, UPEP  Lab Results  Component Value Date   WBC 10.3 06/14/2021   NEUTROABS 7.6 06/14/2021   HGB 13.2 06/14/2021   HCT 41.0 06/14/2021   MCV 81.5 06/14/2021   PLT 354.0 06/14/2021      Chemistry      Component Value Date/Time   NA 140 05/14/2021 1335   NA 141 05/12/2018 1032   NA 139 06/27/2017 0950   K 4.3 05/14/2021 1335   K 4.0 06/27/2017 0950   CL 108 05/14/2021 1335   CO2 23 05/14/2021 1335   CO2 23 06/27/2017 0950   BUN 20 05/14/2021 1335   BUN 15 05/12/2018 1032   BUN 19.2 06/27/2017 0950   CREATININE 1.00 05/14/2021 1335   CREATININE 1.27 (H) 06/08/2018 1417   CREATININE 1.1 06/27/2017 0950      Component Value Date/Time   CALCIUM 8.6 (L) 05/14/2021 1335   CALCIUM 8.8 06/27/2017 0950   ALKPHOS 85 05/14/2021 1335   ALKPHOS 74 06/27/2017 0950   AST 36 05/14/2021 1335   AST 31 06/08/2018 1417   AST 26 06/27/2017 0950   ALT 41 05/14/2021 1335   ALT 39 06/08/2018 1417   ALT 40 06/27/2017 0950   BILITOT 0.8 05/14/2021 1335   BILITOT 0.8 10/30/2020 0958   BILITOT 0.7 06/08/2018 1417   BILITOT 1.05 06/27/2017 0950       No results found for: LABCA2  No components found for: [id number]  No results for input(s): INR in the last 168 hours.  Urinalysis    Component Value Date/Time   COLORURINE YELLOW 03/16/2019 1556   APPEARANCEUR CLEAR 03/16/2019 1556   LABSPEC 1.025 03/16/2019 1556   PHURINE 6.0 03/16/2019 1556   GLUCOSEU NEGATIVE 03/16/2019 1556   HGBUR NEGATIVE 03/16/2019 1556   BILIRUBINUR NEGATIVE 03/16/2019 1556   KETONESUR TRACE (A) 03/16/2019 1556   PROTEINUR NEGATIVE  07/23/2016 1111   UROBILINOGEN 0.2 03/16/2019 1556   NITRITE NEGATIVE 03/16/2019 1556   LEUKOCYTESUR NEGATIVE 03/16/2019 1556    STUDIES: No results found.   ASSESSMENT: 65 y.o. [place] man  (1) status post right periorbital biopsy of the squamous cell carcinoma in November 2002, status post resection and 64  gray of radiation completed February 2003  (2) recurrence to the right perirbital lymph node drainage area, status post right supraomohyoid neck dissection July 2004 with 1 of 8 lymph nodes positive (a) completed adjuvant radiation October 2004, 49 Gy to the upper neck, given with sensitizing Erbitux and cisplatin  (3) status post thyroidectomy for papillary thyroid carcinoma, with postoperative iodine-131 May 2003 (a) on chronic Synthroid supplementation, most recent TSH 1.73 (06/06/2014)  (4) likely alpha thalassemia trait with baseline MCV 79-81 in setting of normal hemoglobin  (5) iron deficiency secondary to gastritis/esophagitis, s/p feraheme, last dose 10/27/2015   PLAN: Alex [name] is now 20 years out from initial diagnosis of his periorbital squamous cell.  He still has some squamous cell cancers and of course these are best dealt with with local treatment.  He has excellent dermatologic follow-up.  We do have systemic treatment if the problem becomes harder to manage and we can use cemiplimab or cetuximab if necessary.  At present though I do not think he needs these interventions.  His wife will be followed by Dr. Marin Olp in the future for her essential thrombocytosis.  I offered to set [name] up with Dr. Marin Olp as well.  He feels that he can easily get an appointment there in if he develops a need for that so at this point we are not making a specific return appointment for him here  Total encounter time 15 minutes.*   Magrinat, Virgie Dad, MD  06/27/21 5:13 PM Medical Oncology and Hematology [facility] [place], [place] [address] Tel.  [phone]    Fax. [phone]    I, Wilburn Mylar, am acting as scribe for Dr. Virgie Dad. Magrinat.  I, Lurline Del MD, have reviewed the above documentation for accuracy and completeness, and I agree with the above.    *Total Encounter Time as defined by the Centers for Medicare and Medicaid Services includes, in addition to the face-to-face time of a patient visit (documented in the note above) non-face-to-face time: obtaining and reviewing outside history, ordering and reviewing medications, tests or procedures, care coordination (communications with other health care professionals or caregivers) and documentation in the medical record.

## 2021-06-28 ENCOUNTER — Encounter (HOSPITAL_COMMUNITY): Payer: Self-pay | Admitting: Licensed Clinical Social Worker

## 2021-06-28 ENCOUNTER — Ambulatory Visit (INDEPENDENT_AMBULATORY_CARE_PROVIDER_SITE_OTHER): Payer: No Typology Code available for payment source | Admitting: Licensed Clinical Social Worker

## 2021-06-28 DIAGNOSIS — F319 Bipolar disorder, unspecified: Secondary | ICD-10-CM

## 2021-06-28 NOTE — Progress Notes (Signed)
Virtual virtual Phone Note  I connected with Lucifer Soja Dudash on 06/18/2021 at 2:00 pm EST by phone-enabled virtual visit. I verified that I am speaking with the correct person using  two identifiers.I discussed the limitations of evaluation and management by telemedicine and the availability of in person appointments. The patient expressed understanding and agreed to proceed.    LOCATION: Patient: Home Provider: Home office  History of Present Illness:  Pt was referred by Dr. Adele Schilder for OP therapy for bipolar disorder and anxiety.    Observations/Objective: Patient presented for today's session on time and was alert, oriented x5, with no evidence or self-report of SI/HI or A/V H.  Patient reported ongoing compliance with medication and denied any use of alcohol or illicit substances.  Clinician inquired about patient's current emotional ratings, as well as any significant changes in thoughts, feelings or behavior since previous session.  Patient reported scores of 8/10 for depression, 8/10 for anxiety, 8/10 or anger/irritability. Pt continues to heal from his major back surgery and continues to have a considerable amount of pain. Pt discussed his pain 4/10, pain meds, sleep issues, lack of support from family. Clinician utilized MI OARS to reflect and summarize thoughts and feelings about this life he is currently experiencing. Clinician discussed the importance of focusing on the here and now, what he can control, rather than what he cannot.     Follow Up Instructions:  I discussed the assessment and treatment plan with the patient. The patient was provided an opportunity to ask questions and all were answered. The patient agreed with the plan and demonstrated an understanding of the instructions.   The patient was advised to call back or seek an in-person evaluation if the symptoms worsen or if the condition fails to improve as anticipated.  I provided 60 minutes of non-face-to-face time  during this encounter.   Jacquelina Hewins S, LCAS

## 2021-06-29 ENCOUNTER — Other Ambulatory Visit (HOSPITAL_COMMUNITY): Payer: Self-pay | Admitting: Psychiatry

## 2021-06-29 DIAGNOSIS — F411 Generalized anxiety disorder: Secondary | ICD-10-CM

## 2021-07-02 ENCOUNTER — Telehealth (HOSPITAL_COMMUNITY): Payer: Self-pay

## 2021-07-02 ENCOUNTER — Ambulatory Visit (HOSPITAL_COMMUNITY): Payer: No Typology Code available for payment source | Admitting: Licensed Clinical Social Worker

## 2021-07-02 ENCOUNTER — Other Ambulatory Visit: Payer: Self-pay

## 2021-07-02 DIAGNOSIS — F411 Generalized anxiety disorder: Secondary | ICD-10-CM

## 2021-07-02 DIAGNOSIS — F319 Bipolar disorder, unspecified: Secondary | ICD-10-CM

## 2021-07-02 MED ORDER — LORAZEPAM 0.5 MG PO TABS: 0.5000 mg | ORAL_TABLET | Freq: Every day | ORAL | 1 refills | Status: DC | PRN

## 2021-07-02 MED ORDER — ZOLPIDEM TARTRATE 10 MG PO TABS: ORAL_TABLET | ORAL | 1 refills | Status: DC

## 2021-07-02 NOTE — Telephone Encounter (Signed)
Medication managment - Telephone call with pt to inform that Dr. Adele Schilder had sent in his requested Lorazepam and Zolpidem orders as requested and to the pharmacies he requested for each.  Pt. to call back if any problems with refills and to return 08/28/21

## 2021-07-02 NOTE — Telephone Encounter (Signed)
Medication refills - Patient called stating he needed refills of his prescribed Lorazepam and Zolpidem as his pharmacies have not gotten a response from requests.  Pt. requests 30 day orders + refills to last until his next appointment scheduled 08/28/21.  Patient requests his Lorazepam be sent into his [facility] on [address] and his Zolpidem be sent into [facility] off of [address] as he gets a better cost there for the Zolpidem.  Agreed to send requests to Dr. Adele Schilder as patient last filled these on 05/31/21.

## 2021-07-02 NOTE — Telephone Encounter (Signed)
Send both meds with refills.

## 2021-07-06 ENCOUNTER — Other Ambulatory Visit: Payer: Self-pay | Admitting: Internal Medicine

## 2021-07-09 ENCOUNTER — Encounter (HOSPITAL_COMMUNITY): Payer: Self-pay | Admitting: Licensed Clinical Social Worker

## 2021-07-09 ENCOUNTER — Other Ambulatory Visit: Payer: Self-pay

## 2021-07-09 ENCOUNTER — Ambulatory Visit (INDEPENDENT_AMBULATORY_CARE_PROVIDER_SITE_OTHER): Payer: No Typology Code available for payment source | Admitting: Licensed Clinical Social Worker

## 2021-07-09 DIAGNOSIS — F319 Bipolar disorder, unspecified: Secondary | ICD-10-CM

## 2021-07-09 NOTE — Progress Notes (Signed)
Virtual virtual Phone Note  I connected with Alex [name] on 07/09/2021 at 2:00 pm EST by phone-enabled virtual visit. I verified that I am speaking with the correct person using  two identifiers.I discussed the limitations of evaluation and management by telemedicine and the availability of in person appointments. The patient expressed understanding and agreed to proceed.    LOCATION: Patient: Home Provider: Home office  History of Present Illness:  Pt was referred by Dr. Adele Schilder for OP therapy for bipolar disorder and anxiety.    Observations/Objective: Patient presented for today's session on time and was alert, oriented x5, with no evidence or self-report of SI/HI or A/V H.  Patient reported ongoing compliance with medication and denied any use of alcohol or illicit substances.  Clinician inquired about patient's current emotional ratings, as well as any significant changes in thoughts, feelings or behavior since previous session.  Patient reported scores of 8/10 for depression, 8/10 for anxiety, 8/10 or anger/irritability. Pt continues to heal from his major back surgery and continues to have a considerable amount of pain. Pt discussed his pain 4/10, pain meds, sleep issues, lack of support from family. Clinician utilized CBT to process his thoughts, feelings, and behaviors.       Follow Up Instructions:  I discussed the assessment and treatment plan with the patient. The patient was provided an opportunity to ask questions and all were answered. The patient agreed with the plan and demonstrated an understanding of the instructions.   The patient was advised to call back or seek an in-person evaluation if the symptoms worsen or if the condition fails to improve as anticipated.  I provided 60 minutes of non-face-to-face time during this encounter.   Jovi Zavadil S, LCAS

## 2021-07-16 ENCOUNTER — Other Ambulatory Visit: Payer: Self-pay

## 2021-07-16 ENCOUNTER — Ambulatory Visit (INDEPENDENT_AMBULATORY_CARE_PROVIDER_SITE_OTHER): Payer: No Typology Code available for payment source | Admitting: Licensed Clinical Social Worker

## 2021-07-16 ENCOUNTER — Encounter (HOSPITAL_COMMUNITY): Payer: Self-pay | Admitting: Licensed Clinical Social Worker

## 2021-07-16 DIAGNOSIS — F319 Bipolar disorder, unspecified: Secondary | ICD-10-CM

## 2021-07-16 NOTE — Progress Notes (Signed)
Virtual virtual Phone Note  I connected with Alex [name] on 07/16/2021 at 4:00 pm EST by phone-enabled virtual visit. I verified that I am speaking with the correct person using  two identifiers.I discussed the limitations of evaluation and management by telemedicine and the availability of in person appointments. The patient expressed understanding and agreed to proceed.    LOCATION: Patient: Home Provider: Home office  History of Present Illness:  Pt was referred by Dr. Adele Schilder for OP therapy for bipolar disorder and anxiety.    Observations/Objective: Patient presented for today's session on time and was alert, oriented x5, with no evidence or self-report of SI/HI or A/V H.  Patient reported ongoing compliance with medication and denied any use of alcohol or illicit substances.  Clinician inquired about patient's current emotional ratings, as well as any significant changes in thoughts, feelings or behavior since previous session.  Patient reported scores of 8/10 for depression, 8/10 for anxiety, 6/10 or anger/irritability. [name] reviewed his emotional ratings and coping skills. Pt continues to heal from his major back surgery and continues to have a considerable amount of pain. Pt discussed his pain 4/10, pain meds, sleep issues, lack of support from family. [name] used CBT to help him focus on reducing pain and distress by modifying physical sensations, catastrophic thinking, and maladaptive behaviors.       Follow Up Instructions:  I discussed the assessment and treatment plan with the patient. The patient was provided an opportunity to ask questions and all were answered. The patient agreed with the plan and demonstrated an understanding of the instructions.   The patient was advised to call back or seek an in-person evaluation if the symptoms worsen or if the condition fails to improve as anticipated.  I provided 30 minutes of non-face-to-face time during this  encounter.   Delorise Hunkele S, LCAS

## 2021-07-23 ENCOUNTER — Other Ambulatory Visit: Payer: Self-pay

## 2021-07-23 ENCOUNTER — Encounter (HOSPITAL_COMMUNITY): Payer: Self-pay | Admitting: Licensed Clinical Social Worker

## 2021-07-23 ENCOUNTER — Ambulatory Visit (INDEPENDENT_AMBULATORY_CARE_PROVIDER_SITE_OTHER): Payer: No Typology Code available for payment source | Admitting: Licensed Clinical Social Worker

## 2021-07-23 DIAGNOSIS — F319 Bipolar disorder, unspecified: Secondary | ICD-10-CM

## 2021-07-23 NOTE — Progress Notes (Signed)
Virtual virtual Phone Note  I connected with Alex [name] on 07/23/2021 at 2:00 pm EST by phone-enabled virtual visit. I verified that I am speaking with the correct person using  two identifiers.I discussed the limitations of evaluation and management by telemedicine and the availability of in person appointments. The patient expressed understanding and agreed to proceed.    LOCATION: Patient: Home Provider: Home office  History of Present Illness:  Pt was referred by Dr. Adele Schilder for OP therapy for bipolar disorder and anxiety.    Observations/Objective: Patient presented for today'[name] session on time and was alert, oriented x5, with no evidence or self-report of SI/HI or A/V H.  Patient reported ongoing compliance with medication and denied any use of alcohol or illicit substances.  Clinician inquired about patient'[name] current emotional ratings, as well as any significant changes in thoughts, feelings or behavior since previous session.  Patient reported scores of 9/10 for depression, 8/10 for anxiety, 6/10 or anger/irritability. [name] reviewed his emotional ratings and coping skills. Pt continues to heal from his major back surgery and continues to have pain. Pt discussed his pain 4/10, pain meds, sleep issues, lack of support from family. Pt is having an increase in his depressive symptoms due to his wife being out of town taking care of 2 grandchildren in [place]. Pt has a co-dependent relationship with his wife, so he has struggled being alone. [name] provided education on co-dependency.     Follow Up Instructions:  I discussed the assessment and treatment plan with the patient. The patient was provided an opportunity to ask questions and all were answered. The patient agreed with the plan and demonstrated an understanding of the instructions.   The patient was advised to call back or seek an in-person evaluation if the symptoms worsen or if the condition fails to improve as anticipated.  I  provided 45 minutes of non-face-to-face time during this encounter.   Alex [name], LCAS

## 2021-07-30 ENCOUNTER — Encounter (HOSPITAL_COMMUNITY): Payer: Self-pay | Admitting: Licensed Clinical Social Worker

## 2021-07-30 ENCOUNTER — Ambulatory Visit (INDEPENDENT_AMBULATORY_CARE_PROVIDER_SITE_OTHER): Payer: No Typology Code available for payment source | Admitting: Licensed Clinical Social Worker

## 2021-07-30 DIAGNOSIS — F319 Bipolar disorder, unspecified: Secondary | ICD-10-CM | POA: Diagnosis not present

## 2021-07-30 NOTE — Progress Notes (Signed)
Virtual virtual Phone Note  I connected with Alex [name] on 12/192022 at 2:00 pm EST by phone-enabled virtual visit. I verified that I am speaking with the correct person using  two identifiers.I discussed the limitations of evaluation and management by telemedicine and the availability of in person appointments. The patient expressed understanding and agreed to proceed.    LOCATION: Patient: Musician Provider: Home office  History of Present Illness:  Pt was referred by Dr. Adele Schilder for OP therapy for bipolar disorder and anxiety.    Observations/Objective: Patient presented for todays session on time and was alert, oriented x5, with no evidence or self-report of SI/HI or A/V H.  Patient reported ongoing compliance with medication and denied any use of alcohol or illicit substances.  Clinician inquired about patients current emotional ratings, as well as any significant changes in thoughts, feelings or behavior since previous session.  Patient reported scores of 9/10 for depression, 8/10 for anxiety, 6/10 for anger/irritability. [name] reviewed his emotional ratings and coping skills. Pt was in car driving back from [place] from a eye dr appointment. Pt discussed his upcoming holiday plans and concerns for family drama.   PLAN: Co-dependency    Follow Up Instructions:  I discussed the assessment and treatment plan with the patient. The patient was provided an opportunity to ask questions and all were answered. The patient agreed with the plan and demonstrated an understanding of the instructions.   The patient was advised to call back or seek an in-person evaluation if the symptoms worsen or if the condition fails to improve as anticipated.  I provided 45 minutes of non-face-to-face time during this encounter.   Alex [name] S, LCAS

## 2021-08-08 ENCOUNTER — Encounter (HOSPITAL_COMMUNITY): Payer: Self-pay | Admitting: Licensed Clinical Social Worker

## 2021-08-08 ENCOUNTER — Ambulatory Visit (INDEPENDENT_AMBULATORY_CARE_PROVIDER_SITE_OTHER): Payer: No Typology Code available for payment source | Admitting: Licensed Clinical Social Worker

## 2021-08-08 ENCOUNTER — Other Ambulatory Visit: Payer: Self-pay

## 2021-08-08 DIAGNOSIS — F319 Bipolar disorder, unspecified: Secondary | ICD-10-CM

## 2021-08-08 NOTE — Progress Notes (Signed)
Virtual virtual Phone Note  I connected with Pratham Cassatt Bair on 08/08/2021 at 2:00 pm EST by phone-enabled virtual visit. I verified that I am speaking with the correct person using  two identifiers.I discussed the limitations of evaluation and management by telemedicine and the availability of in person appointments. The patient expressed understanding and agreed to proceed.    LOCATION: Patient: Musician Provider: Home office  History of Present Illness:  Pt was referred by Dr. Adele Schilder for OP therapy for bipolar disorder and anxiety.    Observations/Objective: Patient presented for todays session on time and was alert, oriented x5, with no evidence or self-report of SI/HI or A/V H.  Patient reported ongoing compliance with medication and denied any use of alcohol or illicit substances.  Clinician inquired about patients current emotional ratings, as well as any significant changes in thoughts, feelings or behavior since previous session.  Patient reported scores of 9/10 for depression, 8/10 for anxiety, 6/10 for anger/irritability. [name] reviewed his emotional ratings and coping skills.  Pt discussed family holiday with little family drama. [name] asked open-ended questions. Pt reports of his upcoming New Years plans with family. [name] and pt role played possible scenarios with family with possible coping skills.   PLAN: Co-dependency    Follow Up Instructions:  I discussed the assessment and treatment plan with the patient. The patient was provided an opportunity to ask questions and all were answered. The patient agreed with the plan and demonstrated an understanding of the instructions.   The patient was advised to call back or seek an in-person evaluation if the symptoms worsen or if the condition fails to improve as anticipated.  I provided 45 minutes of non-face-to-face time during this encounter.   Obinna Ehresman S, LCAS

## 2021-08-09 ENCOUNTER — Other Ambulatory Visit: Payer: Self-pay | Admitting: Internal Medicine

## 2021-08-13 ENCOUNTER — Other Ambulatory Visit: Payer: Self-pay | Admitting: Cardiology

## 2021-08-14 ENCOUNTER — Ambulatory Visit: Payer: No Typology Code available for payment source | Admitting: Dermatology

## 2021-08-20 ENCOUNTER — Ambulatory Visit: Payer: No Typology Code available for payment source | Admitting: Dermatology

## 2021-08-20 ENCOUNTER — Other Ambulatory Visit: Payer: Self-pay

## 2021-08-20 ENCOUNTER — Ambulatory Visit (INDEPENDENT_AMBULATORY_CARE_PROVIDER_SITE_OTHER): Payer: No Typology Code available for payment source | Admitting: Licensed Clinical Social Worker

## 2021-08-20 ENCOUNTER — Encounter (HOSPITAL_COMMUNITY): Payer: Self-pay | Admitting: Licensed Clinical Social Worker

## 2021-08-20 DIAGNOSIS — F319 Bipolar disorder, unspecified: Secondary | ICD-10-CM | POA: Diagnosis not present

## 2021-08-20 DIAGNOSIS — L570 Actinic keratosis: Secondary | ICD-10-CM | POA: Diagnosis not present

## 2021-08-20 NOTE — Progress Notes (Signed)
Virtual virtual Phone Note  I connected with Alex [name] on 08/20/2021 at 11:00 am EST by phone-enabled virtual visit. I verified that I am speaking with the correct person using  two identifiers.I discussed the limitations of evaluation and management by telemedicine and the availability of in person appointments. The patient expressed understanding and agreed to proceed.    LOCATION: Patient: Musician Provider: Home office  History of Present Illness:  Pt was referred by Dr. Adele Schilder for OP therapy for bipolar disorder and anxiety.    Observations/Objective: Patient presented for todays session on time and was alert, oriented x5, with no evidence or self-report of SI/HI or A/V H.  Patient reported ongoing compliance with medication and denied any use of alcohol or illicit substances.  Clinician inquired about patients current emotional ratings, as well as any significant changes in thoughts, feelings or behavior since previous session.  Patient reported scores of  8/10 for depression, 8/10 for anxiety, 6/10 for anger/irritability. [name] reviewed his emotional ratings and coping skills. Pt shared about holidays, which were good, minimal stress/family drama. Pt reports on back pain, which he feels is more than it should be. "I go to the back dr tomorrow." [name] suggested pt write information down to relay to dr.  Clinician noted the challenge in being productive  and feeling positive when physical health is poor. Clinician provided supportive feedback. Pt talked about how his brain begins to race when he gets in bed. [name] provided education on breathing techniques. [name] updated and reviewed tx plan with pt who verbally accepted the plan.   PLAN: Co-dependency    Follow Up Instructions:  I discussed the assessment and treatment plan with the patient. The patient was provided an opportunity to ask questions and all were answered. The patient agreed with the plan and demonstrated an understanding of the  instructions.   The patient was advised to call back or seek an in-person evaluation if the symptoms worsen or if the condition fails to improve as anticipated.  I provided 45 minutes of non-face-to-face time during this encounter.   Basheer Molchan S, LCAS

## 2021-08-27 ENCOUNTER — Ambulatory Visit (HOSPITAL_COMMUNITY): Payer: No Typology Code available for payment source | Admitting: Licensed Clinical Social Worker

## 2021-08-28 ENCOUNTER — Encounter (HOSPITAL_COMMUNITY): Payer: Self-pay | Admitting: Psychiatry

## 2021-08-28 ENCOUNTER — Telehealth (HOSPITAL_BASED_OUTPATIENT_CLINIC_OR_DEPARTMENT_OTHER): Payer: No Typology Code available for payment source | Admitting: Psychiatry

## 2021-08-28 ENCOUNTER — Other Ambulatory Visit: Payer: Self-pay

## 2021-08-28 DIAGNOSIS — F319 Bipolar disorder, unspecified: Secondary | ICD-10-CM

## 2021-08-28 DIAGNOSIS — F411 Generalized anxiety disorder: Secondary | ICD-10-CM

## 2021-08-28 MED ORDER — ZOLPIDEM TARTRATE 10 MG PO TABS: ORAL_TABLET | ORAL | 1 refills | Status: DC

## 2021-08-28 MED ORDER — LAMOTRIGINE ER 300 MG PO TB24: 1.0000 | ORAL_TABLET | ORAL | 0 refills | Status: DC

## 2021-08-28 MED ORDER — LORAZEPAM 0.5 MG PO TABS: ORAL_TABLET | ORAL | 2 refills | Status: DC

## 2021-08-28 NOTE — Progress Notes (Signed)
Virtual Visit via Telephone Note  I connected with Alex [name] on 08/28/21 at  2:40 PM EST by telephone and verified that I am speaking with the correct person using two identifiers.  Location: Patient: Home Provider: Home Office   I discussed the limitations, risks, security and privacy concerns of performing an evaluation and management service by telephone and the availability of in person appointments. I also discussed with the patient that there may be a patient responsible charge related to this service. The patient expressed understanding and agreed to proceed.   History of Present Illness: Patient is evaluated by phone session.  He endorsed multiple health issues which were chronic but he has been busy for doctor's appointment.  He is still have some pain after he had a back surgery in October but slowly and gradually getting better.  He is off from narcotic medication and occasionally takes meloxicam.  He is back on Ambien and Ativan which she had stopped when he was taking narcotic pain medication.  He had a good Christmas is daughter came from [name] and he was able to see his grandkids.  He is nervous about his elderly brother who lives in [place] and now diagnosed with cancer.  Patient 3 of his siblings had died due to cancer out of 7 siblings and he also had a history of cancer.  Patient reported seeing 2 different eye physician for glaucoma and macular degeneration.  He gets sometime very tired but he understands he need doctor's appointment.  He also seeing [name] but now wondering if he can extend his appointment as he does not have a lot of financial resources to keep the appointments.  Denies any mania, psychosis, hallucination.  He denies any impulsive behavior.  He sleeps most of the night good.  He has no tremors, shakes or any EPS.  He has anxiety but denies any major panic attack.  He feels a current medicine working.   Past Psychiatric History: Reviewed. H/O  depression, mood swing, anger and Inpatient at [facility] due to suicidal thoughts but no attempt.  No h/o psychosis but h/o poor impulse control.  Tried Klonopin but did not work.  Took Seroquel, Cymbalta and Remeron (increase depression)  Psychiatric Specialty Exam: Physical Exam  Review of Systems  Weight 182 lb (82.6 kg).There is no height or weight on file to calculate BMI.  General Appearance: NA  Eye Contact:  NA  Speech:  Clear and Coherent  Volume:  Normal  Mood:  Anxious  Affect:  NA  Thought Process:  Descriptions of Associations: Intact  Orientation:  Full (Time, Place, and Person)  Thought Content:  Rumination  Suicidal Thoughts:  No  Homicidal Thoughts:  No  Memory:  Immediate;   Good Recent;   Good Remote;   Good  Judgement:  Intact  Insight:  Present  Psychomotor Activity:  NA  Concentration:  Concentration: Fair and Attention Span: Fair  Recall:  [employer] of Knowledge:  Good  Language:  Good  Akathisia:  No  Handed:  Right  AIMS (if indicated):     Assets:  Communication Skills Desire for Improvement Housing Transportation  ADL's:  Intact  Cognition:  WNL  Sleep:   fair      Assessment and Plan: Bipolar disorder type I.  Generalized anxiety disorder.  Discussed his chronic medical issues and now increased financial stress due to seeing multiple doctors.  He does not want to change the medication since it is working well.  Continue Ambien 5-10 mg as needed for insomnia.  Continue Ativan 0.5 mg during the day and rarely he takes second pill when he feels very anxious.  Continue Lamictal 300 mg daily.  He has no rash or any itching.  Recommended to call us back if is any question or any concern.  Patient like to follow up with [facility] for coping skills.  Follow up in 3 months.  Follow Up Instructions:    I discussed the assessment and treatment plan with the patient. The patient was provided an opportunity to ask questions and all were answered. The patient  agreed with the plan and demonstrated an understanding of the instructions.   The patient was advised to call back or seek an in-person evaluation if the symptoms worsen or if the condition fails to improve as anticipated.  I provided 22 minutes of non-face-to-face time during this encounter.   Kathlee Nations, MD

## 2021-08-29 ENCOUNTER — Encounter (HOSPITAL_COMMUNITY): Payer: Self-pay | Admitting: Licensed Clinical Social Worker

## 2021-08-29 ENCOUNTER — Ambulatory Visit (INDEPENDENT_AMBULATORY_CARE_PROVIDER_SITE_OTHER): Payer: No Typology Code available for payment source | Admitting: Licensed Clinical Social Worker

## 2021-08-29 DIAGNOSIS — F319 Bipolar disorder, unspecified: Secondary | ICD-10-CM | POA: Diagnosis not present

## 2021-08-29 NOTE — Progress Notes (Signed)
Virtual virtual Virtual Note  I connected with Caidan Hubbert Bond on 08/29/2021 at 4:00pm EST by video-enabled virtual visit. I verified that I am speaking with the correct person using  two identifiers.I discussed the limitations of evaluation and management by telemedicine and the availability of in person appointments. The patient expressed understanding and agreed to proceed.    LOCATION: Patient: Musician Provider: Home office  History of Present Illness:  Pt was referred by Dr. Adele Schilder for OP therapy for bipolar disorder and anxiety.    Observations/Objective: Patient presented for todays session on time and was alert, oriented x5, with no evidence or self-report of SI/HI or A/V H.  Patient reported ongoing compliance with medication and denied any use of alcohol or illicit substances.  Clinician inquired about patients current emotional ratings, as well as any significant changes in thoughts, feelings or behavior since previous session.  Patient reported scores of  8/10 for depression, 8/10 for anxiety, 6/10 for anger/irritability. [name] reviewed his emotional ratings and coping skills. Pt reports on the pending death of his brother in [place]. "I'm trying to decide whether to go see him." [name] and pt explored regrets and stages of grief. Pt reports on his health which seems to be getting progressively worse: eye, skin, back, pancreas, liver and how it's affecting his finances. [name] used CBT to assist patient with feelings of brother dying, his own physical health and finances playing a role in health care.   Follow Up Instructions:  I discussed the assessment and treatment plan with the patient. The patient was provided an opportunity to ask questions and all were answered. The patient agreed with the plan and demonstrated an understanding of the instructions.   The patient was advised to call back or seek an in-person evaluation if the symptoms worsen or if the condition fails to improve as  anticipated.  I provided 45 minutes of non-face-to-face time during this encounter.   Terrie Grajales S, LCAS

## 2021-09-03 ENCOUNTER — Ambulatory Visit (HOSPITAL_COMMUNITY): Payer: No Typology Code available for payment source | Admitting: Licensed Clinical Social Worker

## 2021-09-10 ENCOUNTER — Ambulatory Visit (HOSPITAL_COMMUNITY): Payer: No Typology Code available for payment source | Admitting: Licensed Clinical Social Worker

## 2021-09-11 ENCOUNTER — Other Ambulatory Visit: Payer: Self-pay

## 2021-09-11 ENCOUNTER — Encounter (HOSPITAL_COMMUNITY): Payer: Self-pay | Admitting: Licensed Clinical Social Worker

## 2021-09-11 ENCOUNTER — Ambulatory Visit (INDEPENDENT_AMBULATORY_CARE_PROVIDER_SITE_OTHER): Payer: No Typology Code available for payment source | Admitting: Licensed Clinical Social Worker

## 2021-09-11 DIAGNOSIS — F411 Generalized anxiety disorder: Secondary | ICD-10-CM

## 2021-09-11 DIAGNOSIS — F319 Bipolar disorder, unspecified: Secondary | ICD-10-CM

## 2021-09-11 NOTE — Progress Notes (Signed)
Virtual virtual Virtual Note  I connected with Alex [name] on 08/29/2021 at 4:00pm EST by video-enabled virtual visit. I verified that I am speaking with the correct person using  two identifiers.I discussed the limitations of evaluation and management by telemedicine and the availability of in person appointments. The patient expressed understanding and agreed to proceed.    LOCATION: Patient: Musician Provider: Home office  History of Present Illness:  Pt was referred by Dr. Adele Schilder for OP therapy for bipolar disorder and anxiety.    Observations/Objective: Patient presented for todays session on time and was alert, oriented x5, with no evidence or self-report of SI/HI or A/V H.  Patient reported ongoing compliance with medication and denied any use of alcohol or illicit substances.  Clinician inquired about patients current emotional ratings, as well as any significant changes in thoughts, feelings or behavior since previous session.  Patient reported scores of  8/10 for depression, 8/10 for anxiety, 6/10 for anger/irritability. [name] reviewed his emotional ratings and coping skills. Pt reports on his continued stressors: his 3 grown children who still live with him and are dependent financially on him. Again, [name] discussed choices, control, fear. Clinician utilized MI OARS to reflect and summarize thoughts and feelings about his normal life he is experiencing. Clinician discussed the importance of focusing on the here and now, what he can control, rather than what he cannot do.   Follow Up Instructions:  I discussed the assessment and treatment plan with the patient. The patient was provided an opportunity to ask questions and all were answered. The patient agreed with the plan and demonstrated an understanding of the instructions.   The patient was advised to call back or seek an in-person evaluation if the symptoms worsen or if the condition fails to improve as anticipated.  I provided 45  minutes of non-face-to-face time during this encounter.   Elpidia Karn S, LCAS

## 2021-09-12 ENCOUNTER — Encounter: Payer: Self-pay | Admitting: Dermatology

## 2021-09-12 NOTE — Progress Notes (Signed)
° °  Follow-Up Visit   Subjective  Alex [name] is a 66 y.o. male who presents for the following: Annual Exam (Pt here to check Aks on the scalp. Pt has personal hx of skin cancer. Pt states there is no discomfort ).  Crust on scalp Location:  Duration:  Quality:  Associated Signs/Symptoms: Modifying Factors:  Severity:  Timing: Context:   Objective  Well appearing patient in no apparent distress; mood and affect are within normal limits. crown x5, forhead x 2 (7) Approximately 10 hypertrophic actinic keratoses 3 to 8 mm.    A focused examination was performed including head and neck. Relevant physical exam findings are noted in the Assessment and Plan.   Assessment & Plan    AK (actinic keratosis) (7) crown x5, forhead x 2  Destruction of lesion - crown x5, forhead x 2 Complexity: simple   Destruction method: cryotherapy   Informed consent: discussed and consent obtained   Timeout:  patient name, date of birth, surgical site, and procedure verified Lesion destroyed using liquid nitrogen: Yes   Cryotherapy cycles:  3 Outcome: patient tolerated procedure well with no complications   Post-procedure details: wound care instructions given        I, Alex Monarch, MD, have reviewed all documentation for this visit.  The documentation on 09/12/21 for the exam, diagnosis, procedures, and orders are all accurate and complete.

## 2021-09-25 ENCOUNTER — Other Ambulatory Visit: Payer: Self-pay

## 2021-09-25 ENCOUNTER — Ambulatory Visit (HOSPITAL_COMMUNITY): Payer: No Typology Code available for payment source | Admitting: Licensed Clinical Social Worker

## 2021-09-25 ENCOUNTER — Ambulatory Visit (INDEPENDENT_AMBULATORY_CARE_PROVIDER_SITE_OTHER): Payer: No Typology Code available for payment source | Admitting: Licensed Clinical Social Worker

## 2021-09-25 ENCOUNTER — Encounter (HOSPITAL_COMMUNITY): Payer: Self-pay | Admitting: Licensed Clinical Social Worker

## 2021-09-25 DIAGNOSIS — F319 Bipolar disorder, unspecified: Secondary | ICD-10-CM | POA: Diagnosis not present

## 2021-09-25 NOTE — Progress Notes (Signed)
Virtual virtual Phone Note  I connected with Panayiotis Rainville Barcomb on 09/25/2021 at 4:00pm EST by phone-enabled virtual visit. I verified that I am speaking with the correct person using  two identifiers.I discussed the limitations of evaluation and management by telemedicine and the availability of in person appointments. The patient expressed understanding and agreed to proceed.    LOCATION: Patient: Musician Provider: Home office  History of Present Illness:  Pt was referred by Dr. Adele Schilder for OP therapy for bipolar disorder and anxiety.    Observations/Objective: Patient presented for todays session on time and was alert, oriented x5, with no evidence or self-report of SI/HI or A/V H.  Patient reported ongoing compliance with medication and denied any use of alcohol or illicit substances.  Clinician inquired about patients current emotional ratings, as well as any significant changes in thoughts, feelings or behavior since previous session.  Patient reported scores of  8/10 for depression, 8/10 for anxiety, 7/10 for anger/irritability. [name] reviewed his emotional ratings and coping skills. Pt reports on his continued stressors: his 3 grown children who still live with him and are dependent financially on him. "My brother passed away 2 days ago." [name] provided education on stages of grief. "My son is currently in the hospital for self-harm." [name] and pt explored when  son is discharged from hospital. "What are you going to do different, so that your emotional ratings aren't affected." [name] and pt explored his ratings.   Follow Up Instructions:  I discussed the assessment and treatment plan with the patient. The patient was provided an opportunity to ask questions and all were answered. The patient agreed with the plan and demonstrated an understanding of the instructions.   The patient was advised to call back or seek an in-person evaluation if the symptoms worsen or if the condition fails to improve as  anticipated.  I provided 45 minutes of non-face-to-face time during this encounter.   Jorgen Wolfinger S, LCAS

## 2021-09-27 ENCOUNTER — Ambulatory Visit (HOSPITAL_COMMUNITY): Payer: No Typology Code available for payment source | Admitting: Licensed Clinical Social Worker

## 2021-10-02 ENCOUNTER — Ambulatory Visit (INDEPENDENT_AMBULATORY_CARE_PROVIDER_SITE_OTHER): Payer: No Typology Code available for payment source | Admitting: Licensed Clinical Social Worker

## 2021-10-02 ENCOUNTER — Other Ambulatory Visit: Payer: Self-pay

## 2021-10-02 ENCOUNTER — Encounter (HOSPITAL_COMMUNITY): Payer: Self-pay | Admitting: Licensed Clinical Social Worker

## 2021-10-02 DIAGNOSIS — F319 Bipolar disorder, unspecified: Secondary | ICD-10-CM

## 2021-10-02 NOTE — Progress Notes (Signed)
Virtual virtual Video Note  I connected with Alex [name] on 10/02/2021 at 11:00 am EST by video-enabled virtual visit. I verified that I am speaking with the correct person using  two identifiers.I discussed the limitations of evaluation and management by telemedicine and the availability of in person appointments. The patient expressed understanding and agreed to proceed.    LOCATION: Patient: Musician Provider: Home office  History of Present Illness:  Pt was referred by Dr. Adele Schilder for OP therapy for bipolar disorder and anxiety.    Observations/Objective: Patient presented for todays session on time and was alert, oriented x5, with no evidence or self-report of SI/HI or A/V H.  Patient reported ongoing compliance with medication and denied any use of alcohol or illicit substances.  Clinician inquired about patients current emotional ratings, as well as any significant changes in thoughts, feelings or behavior since previous session.  Patient reported scores of  7/10 for depression, 7/10 for anxiety, 5/10 for anger/irritability. [name] reviewed his emotional ratings and coping skills. Pt reports on his continued stressors: his 3 grown children who still live with him and are dependent financially on him.  "I am still grieving over my brother's death." [name] and pt reviewed his stage of grief. [name] provided education on his current stage of grief.  "My son has been discharged from the hospital for self-harm." [name] and pt explored "taking care of his son after release from hospital for self harm." Clinician utilized MI OARS to affirm concerns.   Follow Up Instructions:  I discussed the assessment and treatment plan with the patient. The patient was provided an opportunity to ask questions and all were answered. The patient agreed with the plan and demonstrated an understanding of the instructions.   The patient was advised to call back or seek an in-person evaluation if the symptoms worsen or if the  condition fails to improve as anticipated.  I provided 60 minutes of non-face-to-face time during this encounter.   Azha Constantin S, LCAS

## 2021-10-03 ENCOUNTER — Other Ambulatory Visit: Payer: Self-pay

## 2021-10-03 ENCOUNTER — Ambulatory Visit: Payer: No Typology Code available for payment source | Admitting: Internal Medicine

## 2021-10-03 ENCOUNTER — Encounter: Payer: Self-pay | Admitting: Internal Medicine

## 2021-10-03 ENCOUNTER — Other Ambulatory Visit: Payer: Self-pay | Admitting: Internal Medicine

## 2021-10-03 VITALS — BP 132/81 | HR 89 | Temp 98.0°F | Resp 18 | Ht 72.0 in | Wt 187.0 lb

## 2021-10-03 DIAGNOSIS — F411 Generalized anxiety disorder: Secondary | ICD-10-CM | POA: Diagnosis not present

## 2021-10-03 DIAGNOSIS — R7309 Other abnormal glucose: Secondary | ICD-10-CM | POA: Diagnosis not present

## 2021-10-03 DIAGNOSIS — R300 Dysuria: Secondary | ICD-10-CM | POA: Diagnosis not present

## 2021-10-03 MED ORDER — SULFAMETHOXAZOLE-TRIMETHOPRIM 800-160 MG PO TABS: 1.0000 | ORAL_TABLET | Freq: Two times a day (BID) | ORAL | 0 refills | Status: DC

## 2021-10-03 NOTE — Patient Instructions (Signed)
Please take all new medication as prescribed  - the septra DS  Please continue all other medications as before, and refills have been done if requested.  Please have the pharmacy call with any other refills you may need.  Please keep your appointments with your specialists as you may have planned - [facility] as you discussed  Please go to the LAB at the blood drawing area for the tests to be done - just the urine testing today  You will be contacted by phone if any changes need to be made immediately.  Otherwise, you will receive a letter about your results with an explanation, but please check with MyChart first.  Please remember to sign up for MyChart if you have not done so, as this will be important to you in the future with finding out test results, communicating by private email, and scheduling acute appointments online when needed.

## 2021-10-03 NOTE — Progress Notes (Signed)
Patient ID: CHRISOPHER PUSTEJOVSKY, male   DOB: [birth year], 66 y.o.   MRN: [id number]        Chief Complaint: follow up urinary symptoms x 3 days       HPI:  LAWSEN ARNOTT is a 66 y.o. male here with c/o 3 days onset urinary dysuria and frequency, but Denies urinary symptoms such as urgency, flank pain, hematuria or n/v, fever, chills. Pt denies chest pain, increased sob or doe, wheezing, orthopnea, PND, increased LE swelling, palpitations, dizziness or syncope.   Pt denies polydipsia, polyuria, or new focal neuro s/s.        Wt Readings from Last 3 Encounters:  10/03/21 187 lb (84.8 kg)  06/14/21 177 lb 9.6 oz (80.6 kg)  05/16/21 180 lb 3.2 oz (81.7 kg)   BP Readings from Last 3 Encounters:  10/03/21 132/81  06/14/21 122/84  05/16/21 (!) 152/90         Past Medical History:  Diagnosis Date   Anemia    Anxiety    Atypical nevus 04/12/1997   dyplastic-left chest below nipple   Atypical nevus 01/18/2005   slight-mod-mid upper abd, slight-mod-right lateral chest-(WS), slight-mod-mid lower back (punch)   Atypical nevus 05/31/2005   dysplastic-central lowerback (exc), dysplastic- right abdomen (Exc)   Basal cell carcinoma 06/04/2016   back of neck   Bipolar I disorder ([place])    Bleeding ulcer 2016   BPH (benign prostatic hypertrophy) with urinary obstruction    Cancer (HCC)    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   Cataract    LEFT EYE   Chronic back pain    Complication of anesthesia POST URINARY RETENTION---  2006 SHOULDER SURGERY MARKED BRADYCARDIA VAGAL RESPONSE NO ISSUE W/ SURGERY AFTER THIS ONE   WITH GENERAL ANESTHESIA, 15 YRS AGO VASOVAGAL REACTION NONE SINCE   Corneal hemorrhage 06/03/2018   Entire left eye   Coronary atherosclerosis CARDIOLOGIST- DR CRENSHAW--  LAST VISIT 01-05-2012 IN [facility]   NON-OBSTRUCTIVE MILD DISEASE   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HCC)    Depression    Epicondylitis    right elbow   GERD (gastroesophageal reflux disease)     Glaucoma BOTH EYES   RIGHT EYE RADIATION DAMAGE   Hearing loss    BOTH EARS   Hearing loss    Bilateral   Hepatic cyst    Several, The lesion of concern in segment 6 of the liver has single large portal vein and hepatic vein branches extending to tt, in a pattern of enhancement which mirrors these vascular structures. 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 9 CM X 4. 3 CM PER NOV 06-14-17  CHEST CTFOLOWED BY DR Stanford Breed YEARLY FOR   Positional vertigo    HX OF WITH SINUS INFECTIONS   Radial head fracture    Right   Squamous cell carcinoma of skin 06/04/2016   in situ-crown of scalp   Squamous cell carcinoma of skin 04/22/2017   in situ-crown scalp (txpbx)   Thoracic aortic aneurysm 06/14/2017   last CT 4.1 CM Mild   Tinnitus    CONSTANT   Ulnar nerve compression    right elbow   Unsteady gait    especially with stairs, depth perception off   Urinary hesitancy    Wears glasses    Past Surgical History:  Procedure Laterality Date   CARDIAC CATHETERIZATION  01-16-2006  DR Marcello Moores WALL   MILD CORONARY ATHEROSCLEROSIS/ MID TO DISTAL LAD 40% STENOSIS/ LVF 50-55%    CARPAL TUNNEL RELEASE Right 11/03/2017   Procedure: RIGHT HAND CARPAL TUNNEL RELEASE;  Surgeon: Iran Planas, MD;  Location: [place];  Service: Orthopedics;  Laterality: Right;   CATARACT EXTRACTION Right    COLONSCOPY  2017 LAST DONE   MULTIPLE   ENDOSCOPY  LAST 2017   MULTIPLE DONE DILATION DONE ALSO   ESOPHAGOGASTRODUODENOSCOPY (EGD) WITH PROPOFOL N/A 02/24/2018   Procedure: ESOPHAGOGASTRODUODENOSCOPY (EGD) WITH PROPOFOL;  Surgeon: Laurence Spates, MD;  Location: [facility];  Service: Endoscopy;  Laterality: N/A;   EXCISION RADIAL HEAD Right 11/03/2017   Procedure: RIGHT PROXIMAL RADIUS RADIAL HEAD RESECTION AND JOINT DEBRIDEMENT;  Surgeon: Iran Planas, MD;  Location: [facility];  Service: Orthopedics;  Laterality: Right;   EXTRACORPOREAL SHOCK WAVE LITHOTRIPSY Right 11/20/2020   Procedure: EXTRACORPOREAL SHOCK WAVE LITHOTRIPSY (ESWL);  Surgeon: Alexis Frock, MD;  Location[facility];  Service: Urology;  Laterality: Right;  75 MINS   KNEE ARTHROSCOPY  05/01/2012   Procedure: ARTHROSCOPY KNEE;  Surgeon: Johnn Hai, MD;  Location: [facility];  Service: Orthopedics;  Laterality: Left;  debridement and removal of loose body   LEFT ANKLE ARTHROSCOPY W/ DEBRIDEMENT  05-12-2007   LEFT HYDROCELECTOMY  03-29-2005   AND REPAIR LEFT INGUINAL HERNIA W/ MESH   MOHS SURGERY  2002   RIGHT ORBITAL CANCER   NASAL ENDOSCOPY  08-07-2005   RIGHT EPISTAXIS  / POST SEPTOPLASTY  (HX RIGHT ORBITAL CA & S/P RADIATION/ NECROSIS ANTERIOR END OF BOTH INFERIOR TURBINATES)   occuloplastic surgery  2002   PARS PLANA VITRECTOMY  11-06-2004   RIGHT EYE RADIATION RETINOPATHY W/ HEMORRHAGE   RADIAL HEAD ARTHROPLASTY Right 06/15/2018   Procedure: RIGHT ELBOW PROXIMAL RADIOULNAR JOINT DEBRIDEMENT AND ARTHROPLASTY;  Surgeon: Iran Planas, MD;  Location: [place];  Service: Orthopedics;  Laterality: Right;  BLOCK WITH SEDATION    REPAIR UNDESENDED RIGHT TESTICLE / RIGHT INGUINAL HERNIA  AGE 20   RIGHT ANKLE ARTHROSCOPY W/ EXTENSIVE DEBRIDEMENT  04/05/2008   x2   RIGHT SHOULDER SURGERY  2006   RIGHT SUPRAOMOHYOID NECK DISSECTION   03-08-2003   ZONES 1,2,3;   SUBMANDIBULAR MASS / METASTATIC SQUAMOUS CELL CARCINOMA RIGHT NECK   SAVORY DILATION N/A 02/24/2018   Procedure: SAVORY DILATION;  Surgeon: Laurence Spates, MD;  Location: [facility];  Service: Endoscopy;  Laterality: N/A;   SEPTOPLASTY  NOV 2006   SHOULDER ARTHROSCOPY Left    SHOULDER ARTHROSCOPY W/ SUBACROMIAL DECOMPRESSION AND DISTAL  CLAVICLE EXCISION  10-09-2008   AND DEBRIDEMENT OF RIGHT SHOULDER IMPINGEMENT & [facility] JOINT ARTHRITIS   SPINE SURGERY  2016   l 3 TO l 4 PLATE AND SCREWS   TOTAL THYROIDECTOMY  11-03-2001   PAPILLARY THYROID CARCINOMA   TRANSTHORACIC ECHOCARDIOGRAM  12/ 2012   grade I diastolic dysfunction/ ef 65-46%   ULNAR NERVE TRANSPOSITION Right 04/28/2014   Procedure: RIGHT ELBOW ULNA NERVE RELEASE TRANSPOSTION AND MEDIAL EPICONDYLAR DEBRIDEMENT AND REPAIR;  Surgeon: Linna Hoff, MD;  Location: [place];  Service: Orthopedics;  Laterality: Right;    reports that he has never smoked. He has never used smokeless tobacco. He reports current alcohol use. He reports that he does not use drugs. family history includes COPD in his father; Depression in his daughter, mother, and sister; Drug abuse in his daughter; Hypertension in his father; Kidney disease in his mother; Lung cancer in his brother; Prostate cancer in his father; Psychiatric Illness in his son; Rectal cancer in his sister; Stroke in his father. Allergies  Allergen Reactions   Percocet [Oxycodone-Acetaminophen] Itching    Can take generic.  States only has a problem with percocet brand, also headache   Current Outpatient Medications on File Prior to Visit  Medication Sig Dispense Refill   aspirin EC 81 MG tablet Take 1 tablet by mouth daily.     atorvastatin  (LIPITOR) 40 MG tablet Take 1 tablet (40 mg total) by mouth daily. 90 tablet 3   AXIRON 30 MG/ACT SOLN Place 2 Act onto the skin daily.     chlorhexidine (HIBICLENS) 4 % external liquid Apply topically daily as needed. Clean hands, skin in the shower 1 night prior 500 mL 0   Coenzyme Q10 (CO Q 10 PO) Take 1 capsule by mouth daily.      Immune Globulin, Human, 4 GM/20ML SOLN Inject into the skin once a week. wednesday     LamoTRIgine 300 MG TB24 24 hour tablet Take 1 tablet (300 mg total) by mouth every morning. 90 tablet 0   latanoprost (XALATAN) 0.005 % ophthalmic solution 1 DROP EACH EYE AT NIGHT     levothyroxine (SYNTHROID) 150 MCG tablet Take 1 tablet (150 mcg total) by mouth daily before breakfast. 90 tablet 2   LORazepam (ATIVAN) 0.5 MG tablet Take one tab daily and 2nd if needed for anxiety 45 tablet 2   LUTEIN PO Take 1 tablet by mouth daily.      LYCOPENE PO Take by mouth.     Multiple Vitamin (MULTI-VITAMINS) TABS Take by mouth.     mupirocin ointment (BACTROBAN) 2 % Apply topically 4 (four) times daily. As directed 30 g 0   OMEGA-3 KRILL OIL PO Take 1 capsule by mouth daily.      omeprazole (PRILOSEC) 20 MG capsule Take 20 mg by mouth every evening.     sildenafil (REVATIO) 20 MG tablet Take 1 tablet by mouth once daily 30 tablet 3   tadalafil (CIALIS) 20 MG tablet TAKE 1 TABLET BY MOUTH ONCE DAILY AS NEEDED FOR ERECTILE DYSFUNCTION (Patient taking differently: 5 mg.) 30 tablet 2   tamsulosin (FLOMAX) 0.4 MG CAPS capsule TAKE 1 CAPSULE BY MOUTH EVERY DAY (Patient taking differently: as needed.) 90 capsule 1   telmisartan (MICARDIS) 80 MG tablet TAKE 1 TABLET BY MOUTH EVERY DAY 90 tablet 1   timolol (TIMOPTIC) 0.5 % ophthalmic solution timolol maleate 0.5 % eye drops  INSTILL 1 DROP INTO RIGHT EYE TWICE A DAY  zolpidem (AMBIEN) 10 MG tablet TAKE 1/2 (ONE-HALF) TABLET BY MOUTH AT BEDTIME AND  TAKE  THE  OTHER  HALF  IF  NEEDED 30 tablet 1   amLODipine (NORVASC) 10 MG tablet Take 1  tablet (10 mg total) by mouth daily. 90 tablet 3   No current facility-administered medications on file prior to visit.        ROS:  All others reviewed and negative.  Objective        PE:  BP 132/81    Pulse 89    Temp 98 F (36.7 C) (Oral)    Resp 18    Ht 6' (1.829 m)    Wt 187 lb (84.8 kg)    SpO2 97%    BMI 25.36 kg/m                 Constitutional: Pt appears in NAD               HENT: Head: NCAT.                Right Ear: External ear normal.                 Left Ear: External ear normal.                Eyes: . Pupils are equal, round, and reactive to light. Conjunctivae and EOM are normal               Nose: without d/c or deformity               Neck: Neck supple. Gross normal ROM               Cardiovascular: Normal rate and regular rhythm.                 Pulmonary/Chest: Effort normal and breath sounds without rales or wheezing.                Abd:  Soft,mild low mid abd tender, ND, + BS, no organomegaly, mild left flank tender               Neurological: Pt is alert. At baseline orientation, motor grossly intact               Skin: Skin is warm. No rashes, no other new lesions, LE edema - none               Psychiatric: Pt behavior is normal without agitation , mild nerovus  Micro: none  Cardiac tracings I have personally interpreted today:  none  Pertinent Radiological findings (summarize): none   Lab Results  Component Value Date   WBC 10.3 06/14/2021   HGB 13.2 06/14/2021   HCT 41.0 06/14/2021   PLT 354.0 06/14/2021   GLUCOSE 95 05/14/2021   CHOL 180 10/30/2020   TRIG 98 10/30/2020   HDL 84 10/30/2020   LDLDIRECT 59.0 02/02/2016   LDLCALC 79 10/30/2020   ALT 41 05/14/2021   AST 36 05/14/2021   NA 140 05/14/2021   K 4.3 05/14/2021   CL 108 05/14/2021   CREATININE 1.00 05/14/2021   BUN 20 05/14/2021   CO2 23 05/14/2021   TSH 3.84 04/24/2021   PSA 5.19 (H) 06/14/2021   INR 1.0 09/13/2019   HGBA1C 5.5 09/29/2018   Assessment/Plan:  Wardell Pokorski  Sthilaire is a 66 y.o. White or Caucasian [1] male with  has a past medical history of Anemia, Anxiety, Atypical nevus (  04/12/1997), Atypical nevus (01/18/2005), Atypical nevus (05/31/2005), Basal cell carcinoma (06/04/2016), Bipolar I disorder ([place]), Bleeding ulcer (2016), BPH (benign prostatic hypertrophy) with urinary obstruction, Cancer ([place]), Cataract, Chronic back pain, Complication of anesthesia (POST URINARY RETENTION---  2006 SHOULDER SURGERY MARKED BRADYCARDIA VAGAL RESPONSE NO ISSUE W/ SURGERY AFTER THIS ONE), Corneal hemorrhage (06/03/2018), Coronary atherosclerosis (CARDIOLOGIST- DR CRENSHAW--  LAST VISIT 01-05-2012 IN EPIC), 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[place]), Depression, Epicondylitis, GERD (gastroesophageal reflux disease), Glaucoma (BOTH EYES), Hearing loss, Hearing loss, Hepatic cyst, History of chronic prostatitis, History of deviated nasal septum, History of hiatal hernia, History of kidney stones, History of orbital cancer (2002  RIGHT EYE SQUAMOUS CELL  S/P  MOH'S SURG AND CHEMO RADIATION---  ONCOLOIST  DR MAGRINOT  (IN REMISSION)), History of thyroid cancer (PRIMARY (NO METS FROM ORBITAL CANCER)--   IN REMISSION), Hyperlipidemia, Hypertension, Macular degeneration, Nocturia, OA (osteoarthritis), Pancreas cyst, Peripheral vascular disease ([place]), Positional vertigo, Radial head fracture, Squamous cell carcinoma of skin (06/04/2016), Squamous cell carcinoma of skin (04/22/2017), Thoracic aortic aneurysm (06/14/2017), Tinnitus, Ulnar nerve compression, Unsteady gait, Urinary hesitancy, and Wears glasses.  Dysuria Exam cw prob uti - for urine studies, empiric septra ds,  to f/u any worsening symptoms or concerns  IMPAIRED GLUCOSE TOLERANCE Lab Results  Component Value Date   HGBA1C 5.5 09/29/2018   Stable, pt to continue current medical treatment  - diet   Anxiety state Mild stable overall, cont lamotrigine  Followup: Return if symptoms worsen or fail to improve.  Cathlean Cower, MD 10/06/2021 8:46 PM [facility]

## 2021-10-04 ENCOUNTER — Other Ambulatory Visit: Payer: Self-pay | Admitting: Internal Medicine

## 2021-10-04 ENCOUNTER — Encounter: Payer: Self-pay | Admitting: Internal Medicine

## 2021-10-04 LAB — URINE CULTURE

## 2021-10-04 LAB — URINALYSIS, ROUTINE W REFLEX MICROSCOPIC
Bilirubin Urine: NEGATIVE
Hgb urine dipstick: NEGATIVE
Ketones, ur: NEGATIVE
Nitrite: NEGATIVE
Specific Gravity, Urine: 1.025 (ref 1.000–1.030)
Total Protein, Urine: 30 — AB
Urine Glucose: NEGATIVE
Urobilinogen, UA: 0.2 (ref 0.0–1.0)
pH: 5.5 (ref 5.0–8.0)

## 2021-10-04 MED ORDER — CIPROFLOXACIN HCL 500 MG PO TABS: 500.0000 mg | ORAL_TABLET | Freq: Two times a day (BID) | ORAL | 0 refills | Status: AC

## 2021-10-04 NOTE — Telephone Encounter (Signed)
Pt checking status of mychart response, pt requesting a c/b to discuss mychart request

## 2021-10-04 NOTE — Telephone Encounter (Signed)
Called patient and informed patient that the urine culture was sent off and it takes a few days for those results to come back. Informed patient that an antibotic was sent in to his pharmacy. Patient verbalize understanding. No further questions

## 2021-10-04 NOTE — Telephone Encounter (Signed)
Closing encounter

## 2021-10-06 ENCOUNTER — Encounter: Payer: Self-pay | Admitting: Internal Medicine

## 2021-10-06 NOTE — Assessment & Plan Note (Signed)
Exam cw prob uti - for urine studies, empiric septra ds,  to f/u any worsening symptoms or concerns

## 2021-10-06 NOTE — Assessment & Plan Note (Signed)
Mild stable overall, cont lamotrigine

## 2021-10-06 NOTE — Assessment & Plan Note (Signed)
Lab Results  Component Value Date   HGBA1C 5.5 09/29/2018   Stable, pt to continue current medical treatment  - diet

## 2021-10-09 ENCOUNTER — Ambulatory Visit (HOSPITAL_COMMUNITY): Payer: No Typology Code available for payment source | Admitting: Licensed Clinical Social Worker

## 2021-10-15 ENCOUNTER — Other Ambulatory Visit: Payer: Self-pay

## 2021-10-15 ENCOUNTER — Ambulatory Visit (INDEPENDENT_AMBULATORY_CARE_PROVIDER_SITE_OTHER): Payer: No Typology Code available for payment source | Admitting: Licensed Clinical Social Worker

## 2021-10-15 DIAGNOSIS — F319 Bipolar disorder, unspecified: Secondary | ICD-10-CM

## 2021-10-16 ENCOUNTER — Ambulatory Visit (HOSPITAL_COMMUNITY): Payer: No Typology Code available for payment source | Admitting: Licensed Clinical Social Worker

## 2021-10-17 ENCOUNTER — Other Ambulatory Visit (HOSPITAL_COMMUNITY): Payer: Self-pay | Admitting: Psychiatry

## 2021-10-17 DIAGNOSIS — F411 Generalized anxiety disorder: Secondary | ICD-10-CM

## 2021-10-17 DIAGNOSIS — F319 Bipolar disorder, unspecified: Secondary | ICD-10-CM

## 2021-10-22 ENCOUNTER — Encounter (HOSPITAL_COMMUNITY): Payer: Self-pay | Admitting: Licensed Clinical Social Worker

## 2021-10-22 NOTE — Progress Notes (Signed)
Virtual virtual Video Note ? ?I connected with Alex [name] on 10/02/2021 at 11:00 am EST by video-enabled virtual visit. I verified that I am speaking with the correct person using  two identifiers.I discussed the limitations of evaluation and management by telemedicine and the availability of in person appointments. The patient expressed understanding and agreed to proceed.  ? ? ?LOCATION: ?Patient: Alex [name] ?Provider: Home office ? ?History of Present Illness:  Pt was referred by Dr. Adele Schilder for OP therapy for bipolar disorder and anxiety. ? ?Treatment Goal Addressed:  Pt will meet with clinician 1x every 2 weeks for therapy to monitor for progress towards goals and address any barriers to success; Reduce depression from average severity level of 6/10 down to a 4/10 in next 90 days by engaging in 1-2 positive coping skills fdaily as part of developing self-care routine; Reduce average anxiety level from 7/10 down to 5/10 in next 90 days by utilizing 1-2 relaxation skills/grounding skills per day, such as mindful breathing, progressive muscle relaxation, positive visualizations. ? ?Progress towards Treatment Goal: Progressing ?  ?Observations/Objective: Patient presented for today?s session on time and was alert, oriented x5, with no evidence or self-report of SI/HI or A/V H.  Patient reported ongoing compliance with medication and denied any use of alcohol or illicit substances.  Clinician inquired about patient?s current emotional ratings, as well as any significant changes in thoughts, feelings or behavior since previous session.  Patient reported scores of  7/10 for depression, 7/10 for anxiety, 5/10 for anger/irritability. [name] reviewed his emotional ratings and coping skills used daily. Pt reports on his continued stressors: his 3 grown children who still live with him and are dependent financially on him.  "I am still grieving over my brother's death and we will have a virtual service this week for him." [name]  and pt reviewed his stage of grief. Patient discussed his childhood memories of he and his brother. [name] asked open ended questions. [name] used MI OARS to develop discrepancy in his memories and current relationship with brother. ? ?Collaboration of care: none needed at this session ? ? ? ?Patient/Guardian was advised Release of Information must be obtained prior to any record release in order to collaborate their care with an outside provider. Patient/Guardian was advised if they have not already done so to contact the registration department to sign all necessary forms in order for [place] to release information regarding their care.  ?  ?Consent: Patient/Guardian gives verbal consent for treatment and assignment of benefits for services provided during this visit. Patient/Guardian expressed understanding and agreed to proceed.   ? ? ?Follow Up Instructions: ? ?I discussed the assessment and treatment plan with the patient. The patient was provided an opportunity to ask questions and all were answered. The patient agreed with the plan and demonstrated an understanding of the instructions. ?  ?The patient was advised to call back or seek an in-person evaluation if the symptoms worsen or if the condition fails to improve as anticipated. ? ?I provided 60 minutes of non-face-to-face time during this encounter. ? ? ?Ivylynn Hoppes S, LCAS ? ? ?

## 2021-10-30 ENCOUNTER — Other Ambulatory Visit: Payer: Self-pay

## 2021-10-30 ENCOUNTER — Ambulatory Visit (INDEPENDENT_AMBULATORY_CARE_PROVIDER_SITE_OTHER): Payer: No Typology Code available for payment source | Admitting: Licensed Clinical Social Worker

## 2021-10-30 DIAGNOSIS — F411 Generalized anxiety disorder: Secondary | ICD-10-CM

## 2021-10-30 DIAGNOSIS — F319 Bipolar disorder, unspecified: Secondary | ICD-10-CM | POA: Diagnosis not present

## 2021-11-01 ENCOUNTER — Encounter (HOSPITAL_COMMUNITY): Payer: Self-pay | Admitting: Licensed Clinical Social Worker

## 2021-11-01 NOTE — Progress Notes (Signed)
Virtual virtual Video Note ? ?I connected with Maurie Olesen Giannattasio on 10/30/2021 at 1:00 pm EST by video-enabled virtual visit. I verified that I am speaking with the correct person using  two identifiers.I discussed the limitations of evaluation and management by telemedicine and the availability of in person appointments. The patient expressed understanding and agreed to proceed.  ? ? ?LOCATION: ?Patient: Car/Home driveway ?Provider: Home office ? ?History of Present Illness:  Pt was referred by Dr. Adele Schilder for OP therapy for bipolar disorder and anxiety. ? ?Treatment Goal Addressed:  Pt will meet with clinician 1x every 2 weeks for therapy to monitor for progress towards goals and address any barriers to success; Reduce depression from average severity level of 6/10 down to a 4/10 in next 90 days by engaging in 1-2 positive coping skills fdaily as part of developing self-care routine; Reduce average anxiety level from 7/10 down to 5/10 in next 90 days by utilizing 1-2 relaxation skills/grounding skills per day, such as mindful breathing, progressive muscle relaxation, positive visualizations. ? ?Progress towards Treatment Goal: Progressing ?  ?Observations/Objective: Patient presented for today?s session on time and was alert, oriented x5, with no evidence or self-report of SI/HI or A/V H.  Patient reported ongoing compliance with medication and denied any use of alcohol or illicit substances.  Clinician inquired about patient?s current emotional ratings, as well as any significant changes in thoughts, feelings or behavior since previous session.  Patient reported scores of  7/10 for depression, 7/10 for anxiety, 5/10 for anger/irritability. [name] reviewed his emotional ratings and coping skills used daily. Pt reports on his continued stressors: his 3 grown children who still live with him and are dependent financially on him.  Pt reports on his stages of grief due to the death of his brother. [name] reviewed each stage of  grief. Pt reported on the "earth friendly" service which was streamed from [place]. "I felt lots of emotions during the service and cried." [name] explained crying is just showing emotions. Pt reports his son is once again in the hospital for self hard and is on a psychiatric hold. "I feel helpless due to his continued self harm. Clinician utilized MI OARS to affirm concerns.  ? ? ?Collaboration of care: none needed at this session ? ? ? ?Patient/Guardian was advised Release of Information must be obtained prior to any record release in order to collaborate their care with an outside provider. Patient/Guardian was advised if they have not already done so to contact the registration department to sign all necessary forms in order for [place] to release information regarding their care.  ?  ?Consent: Patient/Guardian gives verbal consent for treatment and assignment of benefits for services provided during this visit. Patient/Guardian expressed understanding and agreed to proceed.   ? ? ?Follow Up Instructions: ? ?I discussed the assessment and treatment plan with the patient. The patient was provided an opportunity to ask questions and all were answered. The patient agreed with the plan and demonstrated an understanding of the instructions. ?  ?The patient was advised to call back or seek an in-person evaluation if the symptoms worsen or if the condition fails to improve as anticipated. ? ?I provided 60 minutes of non-face-to-face time during this encounter. ? ? ?Daurice Ovando S, LCAS ? ? ?

## 2021-11-13 ENCOUNTER — Encounter (HOSPITAL_COMMUNITY): Payer: Self-pay | Admitting: Licensed Clinical Social Worker

## 2021-11-13 ENCOUNTER — Ambulatory Visit (INDEPENDENT_AMBULATORY_CARE_PROVIDER_SITE_OTHER): Payer: No Typology Code available for payment source | Admitting: Dermatology

## 2021-11-13 ENCOUNTER — Encounter: Payer: Self-pay | Admitting: Dermatology

## 2021-11-13 ENCOUNTER — Ambulatory Visit (INDEPENDENT_AMBULATORY_CARE_PROVIDER_SITE_OTHER): Payer: No Typology Code available for payment source | Admitting: Licensed Clinical Social Worker

## 2021-11-13 DIAGNOSIS — Z85828 Personal history of other malignant neoplasm of skin: Secondary | ICD-10-CM | POA: Diagnosis not present

## 2021-11-13 DIAGNOSIS — Z1283 Encounter for screening for malignant neoplasm of skin: Secondary | ICD-10-CM | POA: Diagnosis not present

## 2021-11-13 DIAGNOSIS — L821 Other seborrheic keratosis: Secondary | ICD-10-CM | POA: Diagnosis not present

## 2021-11-13 DIAGNOSIS — L570 Actinic keratosis: Secondary | ICD-10-CM | POA: Diagnosis not present

## 2021-11-13 DIAGNOSIS — F319 Bipolar disorder, unspecified: Secondary | ICD-10-CM

## 2021-11-13 DIAGNOSIS — Z86018 Personal history of other benign neoplasm: Secondary | ICD-10-CM | POA: Diagnosis not present

## 2021-11-13 NOTE — Progress Notes (Signed)
Arts administrator Note ? ?I connected with Furman Trentman Rojero on 11/13/2021 at 1:00 pm EST by phone-enabled virtual visit. I verified that I am speaking with the correct person using  two identifiers.I discussed the limitations of evaluation and management by telemedicine and the availability of in person appointments. The patient expressed understanding and agreed to proceed.  ? ? ?LOCATION: ?Patient: Alex [name] ?Provider: Home office ? ?History of Present Illness:  Pt was referred by Dr. Adele Schilder for OP therapy for bipolar disorder and anxiety. ? ?Treatment Goal Addressed:  Pt will meet with clinician 1x every 2 weeks for therapy to monitor for progress towards goals and address any barriers to success; Reduce depression from average severity level of 6/10 down to a 4/10 in next 90 days by engaging in 1-2 positive coping skills fdaily as part of developing self-care routine; Reduce average anxiety level from 7/10 down to 5/10 in next 90 days by utilizing 1-2 relaxation skills/grounding skills per day, such as mindful breathing, progressive muscle relaxation, positive visualizations. ? ?Progress towards Treatment Goal: Progressing ?  ?Observations/Objective: Patient presented for today?s session on time and was alert, oriented x5, with no evidence or self-report of SI/HI or A/V H.  Patient reported ongoing compliance with medication and denied any use of alcohol or illicit substances.  Clinician inquired about patient?s current emotional ratings, as well as any significant changes in thoughts, feelings or behavior since previous session.  Patient reported scores of  8/10 for depression, 7/10 for anxiety, 6/10 for anger/irritability. Pt explored his emotional ratings, which have increased. Pt seemed more depressed than usual. "I'm feeling hopeless about my future in retirement." [name] asked open ended questions. "My son is still in the hospital, he has now been sent to the psychiatric floor and has a sitter." [name]  provided information on non-suicidal self-harm and characteristics of BPD. Clinician utilized MI OARS to reflect and summarize thoughts and feelings. ? ? ? ?Collaboration of care: none needed at this session ? ? ? ?Patient/Guardian was advised Release of Information must be obtained prior to any record release in order to collaborate their care with an outside provider. Patient/Guardian was advised if they have not already done so to contact the registration department to sign all necessary forms in order for [place] to release information regarding their care.  ?  ?Consent: Patient/Guardian gives verbal consent for treatment and assignment of benefits for services provided during this visit. Patient/Guardian expressed understanding and agreed to proceed.   ? ? ?Follow Up Instructions: ? ?I discussed the assessment and treatment plan with the patient. The patient was provided an opportunity to ask questions and all were answered. The patient agreed with the plan and demonstrated an understanding of the instructions. ?  ?The patient was advised to call back or seek an in-person evaluation if the symptoms worsen or if the condition fails to improve as anticipated. ? ?I provided 60 minutes of non-face-to-face time during this encounter. ? ? ?Diandra Cimini S, LCAS ? ? ?

## 2021-11-26 ENCOUNTER — Ambulatory Visit (INDEPENDENT_AMBULATORY_CARE_PROVIDER_SITE_OTHER): Payer: No Typology Code available for payment source | Admitting: Licensed Clinical Social Worker

## 2021-11-26 ENCOUNTER — Encounter (HOSPITAL_COMMUNITY): Payer: Self-pay | Admitting: Licensed Clinical Social Worker

## 2021-11-26 DIAGNOSIS — F319 Bipolar disorder, unspecified: Secondary | ICD-10-CM | POA: Diagnosis not present

## 2021-11-26 NOTE — Progress Notes (Signed)
Virtual virtual Video Note ? ?I connected with Alex [name] on 11/26/2021 at 1:00 pm EST by video-enabled virtual visit. I verified that I am speaking with the correct person using  two identifiers.I discussed the limitations of evaluation and management by telemedicine and the availability of in person appointments. The patient expressed understanding and agreed to proceed.  ? ? ?LOCATION: ?Patient: Car/Home driveway ?Provider: Home office ? ? ?History of Present Illness:  Pt was referred by Dr. Adele [name] for OP therapy for bipolar disorder and anxiety. ? ?Treatment Goal Addressed:  Pt will meet with clinician 1x every 2 weeks for therapy to monitor for progress towards goals and address any barriers to success; Reduce depression from average severity level of 6/10 down to a 4/10 in next 90 days by engaging in 1-2 positive coping skills fdaily as part of developing self-care routine; Reduce average anxiety level from 7/10 down to 5/10 in next 90 days by utilizing 1-2 relaxation skills/grounding skills per day, such as mindful breathing, progressive muscle relaxation, positive visualizations. ? ?Progress towards Treatment Goal: Progressing ?  ?Observations/Objective: Patient presented for today?s session on time and was alert, oriented x5, with no evidence or self-report of SI/HI or A/V H.  Patient reported ongoing compliance with medication and denied any use of alcohol or illicit substances.  Clinician inquired about patient?s current emotional ratings, as well as any significant changes in thoughts, feelings or behavior since previous session.  Patient reported scores of  8/10 for depression, 8/10 for anxiety, 6/10 for anger/irritability. Pt explored his emotional ratings, which remain high. Pt continues to seem more depressed than usual. Pt provided an update on his children, health issues and family. "I continue feeling hopeless about my future in retirement." [name] used CBT to address thought processes, support  and confidence in his decisions.  ? ? ? ? ? ? ?Collaboration of care: none needed at this session ? ? ? ?Patient/Guardian was advised Release of Information must be obtained prior to any record release in order to collaborate their care with an outside provider. Patient/Guardian was advised if they have not already done so to contact the registration department to sign all necessary forms in order for [place] to release information regarding their care.  ?  ?Consent: Patient/Guardian gives verbal consent for treatment and assignment of benefits for services provided during this visit. Patient/Guardian expressed understanding and agreed to proceed.   ? ? ?Follow Up Instructions: ? ?I discussed the assessment and treatment plan with the patient. The patient was provided an opportunity to ask questions and all were answered. The patient agreed with the plan and demonstrated an understanding of the instructions. ?  ?The patient was advised to call back or seek an in-person evaluation if the symptoms worsen or if the condition fails to improve as anticipated. ? ?I provided 60 minutes of non-face-to-face time during this encounter. ? ? ?Alex [name] S, LCAS ? ? ?

## 2021-11-27 ENCOUNTER — Telehealth (HOSPITAL_COMMUNITY): Payer: No Typology Code available for payment source | Admitting: Psychiatry

## 2021-12-04 ENCOUNTER — Encounter: Payer: Self-pay | Admitting: Dermatology

## 2021-12-04 NOTE — Progress Notes (Signed)
? ?  Follow-Up Visit ?  ?Subjective  ?Alex [name] is a 66 y.o. male who presents for the following: Annual Exam (Annual exam. Personal history of scc, bcc and atypical moles. ). ? ?General skin check, several areas of concern ?Location:  ?Duration:  ?Quality:  ?Associated Signs/Symptoms: ?Modifying Factors:  ?Severity:  ?Timing: ?Context:  ? ?Objective  ?Well appearing patient in no apparent distress; mood and affect are within normal limits. ? ? ?Left Buccal Cheek, Left Posterior Neck, Mid Frontal Scalp (3), Mid Parietal Scalp, Right Nasal Sidewall ?Half dozen gritty to hornlike pink 2 to 4 mm crusts ? ?Left Lower Leg - Anterior, Left Parotid Area, Left Temporal Scalp ?4 to 6 mm flattopped textured brown papules ? ? ? ?A focused examination was performed including all skin waist up. Relevant physical exam findings are noted in the Assessment and Plan. ? ? ?Assessment & Plan  ? ? ?Encounter for screening for malignant neoplasm of skin ? ?AK (actinic keratosis) (7) ?Mid Frontal Scalp (3); Mid Parietal Scalp; Right Nasal Sidewall; Left Buccal Cheek; Left Posterior Neck ? ?Destruction of lesion - Left Buccal Cheek, Left Posterior Neck, Mid Frontal Scalp, Mid Parietal Scalp, Right Nasal Sidewall ?Complexity: simple   ?Destruction method: cryotherapy   ?Informed consent: discussed and consent obtained   ?Timeout:  patient name, date of birth, surgical site, and procedure verified ?Lesion destroyed using liquid nitrogen: Yes   ?Cryotherapy cycles:  3 ?Outcome: patient tolerated procedure well with no complications   ?Post-procedure details: wound care instructions given   ? ?Seborrheic keratosis (3) ?Left Lower Leg - Anterior; Left Parotid Area; Left Temporal Scalp ? ?Leave if stable ? ? ? ? ? ?I, [name], MD, have reviewed all documentation for this visit.  The documentation on 12/04/21 for the exam, diagnosis, procedures, and orders are all accurate and complete. ?

## 2021-12-06 ENCOUNTER — Encounter (HOSPITAL_COMMUNITY): Payer: Self-pay | Admitting: Psychiatry

## 2021-12-06 ENCOUNTER — Telehealth (HOSPITAL_BASED_OUTPATIENT_CLINIC_OR_DEPARTMENT_OTHER): Payer: No Typology Code available for payment source | Admitting: Psychiatry

## 2021-12-06 DIAGNOSIS — F319 Bipolar disorder, unspecified: Secondary | ICD-10-CM

## 2021-12-06 DIAGNOSIS — F411 Generalized anxiety disorder: Secondary | ICD-10-CM

## 2021-12-06 MED ORDER — LORAZEPAM 0.5 MG PO TABS: ORAL_TABLET | ORAL | 2 refills | Status: DC

## 2021-12-06 MED ORDER — ZOLPIDEM TARTRATE 10 MG PO TABS: ORAL_TABLET | ORAL | 1 refills | Status: DC

## 2021-12-06 MED ORDER — LAMOTRIGINE ER 300 MG PO TB24: 1.0000 | ORAL_TABLET | ORAL | 0 refills | Status: DC

## 2021-12-06 NOTE — Progress Notes (Signed)
Virtual Visit via Video Note ? ?I connected with Joni Norrod Pittinger on 12/06/21 at 11:40 AM EDT by a video enabled telemedicine application and verified that I am speaking with the correct person using two identifiers. ? ?Location: ?Patient: In Car ?Provider: Home Office ?  ?I discussed the limitations of evaluation and management by telemedicine and the availability of in person appointments. The patient expressed understanding and agreed to proceed. ? ?History of Present Illness: ?Patient is evaluated by video session.  He is taking his medication and reported things are manageable.  He has chronic stressors like financial issues, family issues and chronic health issues but stable.  He endorsed occasionally he have manic like episodes when he cannot sleep.  He also admitted watching porn when he feels very elated but denies any intent.  He admitted caught by his wife and have not done recently.  He sleeps at least 7 hours.  He is sad about going to dialysis and [name] to visit his daughter and grandparents.  Denies any anger, crying spells or any feeling of hopelessness about he has vision issues in his eye and he is keeping appointment and may need injection.  His peripheral vision is not as good but he is able to drive during the day.  He preferred not to drive in the night and usually he ask his wife to drive if she is available.  His appetite is okay.  He denies any major panic attacks.  He is taking Ativan during the day and rarely second pill when he is very nervous.  He sleeps okay with the Ambien.  He has no rash or any itching. ? ?Past Psychiatric History: Reviewed. ?H/O depression, mood swing, anger and Inpatient at [facility] due to suicidal thoughts but no attempt.  No h/o psychosis but h/o poor impulse control.  Tried Klonopin but did not work.  Took Seroquel, Cymbalta and Remeron (increase depression) ? ?Psychiatric Specialty Exam: ?Physical Exam  ?Review of Systems  ?Weight 186 lb (84.4 kg).There is no  height or weight on file to calculate BMI.  ?General Appearance: Casual  ?Eye Contact:  Good  ?Speech:  Clear and Coherent  ?Volume:  Normal  ?Mood:  Euthymic  ?Affect:  Appropriate  ?Thought Process:  Goal Directed  ?Orientation:  Full (Time, Place, and Person)  ?Thought Content:  Logical  ?Suicidal Thoughts:  No  ?Homicidal Thoughts:  No  ?Memory:  Immediate;   Good ?Recent;   Fair ?Remote;   Fair  ?Judgement:  Intact  ?Insight:  Present  ?Psychomotor Activity:  Normal  ?Concentration:  Concentration: Fair and Attention Span: Fair  ?Recall:  Fair  ?Fund of Knowledge:  Good  ?Language:  Good  ?Akathisia:  No  ?Handed:  Right  ?AIMS (if indicated):     ?Assets:  Communication Skills ?Desire for Improvement ?Housing ?Social Support ?Talents/Skills ?Transportation  ?ADL's:  Intact  ?Cognition:  WNL  ?Sleep:   ok  ?  ? ? ?Assessment and Plan: ?Bipolar disorder type I.  Generalized anxiety disorder. ? ?Patient is stable on his current medication.  Despite chronic medical issues and other stressors his symptoms are manageable.  He is excited about upcoming trip to [place] and [name] to see his daughter and grandkids.  Continue Ambien 5 to 10 mg as needed for insomnia, Ativan 0.5 mg a day and second if needed anxiety and lamotrigine 300 mg daily.  Recommended to call us back if is any question or any concern continue to follow-up with [name]  for coping skills.  Follow-up in 3 months. ? ?Follow Up Instructions: ? ?  ?I discussed the assessment and treatment plan with the patient. The patient was provided an opportunity to ask questions and all were answered. The patient agreed with the plan and demonstrated an understanding of the instructions. ?  ?The patient was advised to call back or seek an in-person evaluation if the symptoms worsen or if the condition fails to improve as anticipated. ? ?Collaboration of Care: Primary Care Provider AEB notes are available in epic to review. ? ?Patient/Guardian was advised Release of  Information must be obtained prior to any record release in order to collaborate their care with an outside provider. Patient/Guardian was advised if they have not already done so to contact the registration department to sign all necessary forms in order for [place] to release information regarding their care.  ? ?Consent: Patient/Guardian gives verbal consent for treatment and assignment of benefits for services provided during this visit. Patient/Guardian expressed understanding and agreed to proceed.   ? ?I provided 20 minutes of non-face-to-face time during this encounter. ? ? ?Kathlee Nations, MD  ?

## 2021-12-11 ENCOUNTER — Ambulatory Visit (INDEPENDENT_AMBULATORY_CARE_PROVIDER_SITE_OTHER): Payer: No Typology Code available for payment source | Admitting: Licensed Clinical Social Worker

## 2021-12-11 DIAGNOSIS — F319 Bipolar disorder, unspecified: Secondary | ICD-10-CM

## 2021-12-14 ENCOUNTER — Other Ambulatory Visit: Payer: Self-pay | Admitting: Gastroenterology

## 2021-12-14 ENCOUNTER — Telehealth: Payer: Self-pay | Admitting: Cardiology

## 2021-12-14 DIAGNOSIS — I10 Essential (primary) hypertension: Secondary | ICD-10-CM

## 2021-12-14 DIAGNOSIS — R109 Unspecified abdominal pain: Secondary | ICD-10-CM

## 2021-12-14 MED ORDER — AMLODIPINE BESYLATE 5 MG PO TABS: 5.0000 mg | ORAL_TABLET | Freq: Every day | ORAL | 3 refills | Status: DC

## 2021-12-14 NOTE — Telephone Encounter (Signed)
Alex Perla, MD  Cristopher Estimable, RN 5 hours ago (11:33 AM)  ? ?Okay to resume amlodipine 5 mg daily and follow blood pressure.  ?[name]   ? ?Returned call to patient and made him aware of Dr. Jacalyn Lefevre recommendations. Script for Amlodipine '5mg'$  once daily sent to patient preferred pharmacy. Advised patient to call back to office with any issues, questions, or concerns. Patient verbalized understanding.  ? ?

## 2021-12-18 ENCOUNTER — Encounter: Payer: Self-pay | Admitting: Internal Medicine

## 2021-12-19 ENCOUNTER — Ambulatory Visit (INDEPENDENT_AMBULATORY_CARE_PROVIDER_SITE_OTHER): Payer: No Typology Code available for payment source | Admitting: Licensed Clinical Social Worker

## 2021-12-19 ENCOUNTER — Encounter (HOSPITAL_COMMUNITY): Payer: Self-pay | Admitting: Licensed Clinical Social Worker

## 2021-12-19 DIAGNOSIS — F319 Bipolar disorder, unspecified: Secondary | ICD-10-CM | POA: Diagnosis not present

## 2021-12-19 NOTE — Progress Notes (Signed)
Virtual virtual Video Note ? ?I connected with Alex [name] on 12/11/2021 at 11:00 am EST by video-enabled virtual visit. I verified that I am speaking with the correct person using  two identifiers.I discussed the limitations of evaluation and management by telemedicine and the availability of in person appointments. The patient expressed understanding and agreed to proceed.  ? ? ?LOCATION: ?Patient: Alex [name] ?Provider: Home office ? ? ?History of Present Illness:  Pt was referred by Dr. Adele Schilder for OP therapy for bipolar disorder and anxiety. ? ?Treatment Goal Addressed:  Pt will meet with clinician 1x every 2 weeks for therapy to monitor for progress towards goals and address any barriers to success; Reduce depression from average severity level of 6/10 down to a 4/10 in next 90 days by engaging in 1-2 positive coping skills fdaily as part of developing self-care routine; Reduce average anxiety level from 7/10 down to 5/10 in next 90 days by utilizing 1-2 relaxation skills/grounding skills per day, such as mindful breathing, progressive muscle relaxation, positive visualizations. ? ?Progress towards Treatment Goal: Progressing ?  ?Observations/Objective: Patient presented for today?s session on time and was alert, oriented x5, with no evidence or self-report of SI/HI or A/V H.  Patient reported ongoing compliance with medication and denied any use of alcohol or illicit substances.  Clinician inquired about patient?s current emotional ratings, as well as any significant changes in thoughts, feelings or behavior since previous session.  Patient reported scores of  8/10 for depression, 8/10 for anxiety, 6/10 for anger/irritability. Pt explored his emotional ratings, which remain high. Pt provided an update on his children, health issues and family. My moods continue to go up and down due to my many stressors. Clinician utilized MI OARS to reflect and summarize thoughts, feelings and  concerns. ? ? ? ? ? ?Collaboration of care: none needed at this session ? ? ? ?Patient/Guardian was advised Release of Information must be obtained prior to any record release in order to collaborate their care with an outside provider. Patient/Guardian was advised if they have not already done so to contact the registration department to sign all necessary forms in order for [place] to release information regarding their care.  ?  ?Consent: Patient/Guardian gives verbal consent for treatment and assignment of benefits for services provided during this visit. Patient/Guardian expressed understanding and agreed to proceed.   ? ? ?Follow Up Instructions: ? ?I discussed the assessment and treatment plan with the patient. The patient was provided an opportunity to ask questions and all were answered. The patient agreed with the plan and demonstrated an understanding of the instructions. ?  ?The patient was advised to call back or seek an in-person evaluation if the symptoms worsen or if the condition fails to improve as anticipated. ? ?I provided 60 minutes of non-face-to-face time during this encounter. ? ? ?Junita Kubota S, LCAS ? ? ?

## 2021-12-20 ENCOUNTER — Other Ambulatory Visit: Payer: Self-pay | Admitting: Urology

## 2021-12-20 ENCOUNTER — Ambulatory Visit
Admission: RE | Admit: 2021-12-20 | Discharge: 2021-12-20 | Disposition: A | Discharge status: Home / Self Care | Payer: No Typology Code available for payment source | Source: Ambulatory Visit | Attending: Gastroenterology | Admitting: Gastroenterology

## 2021-12-20 DIAGNOSIS — R109 Unspecified abdominal pain: Secondary | ICD-10-CM

## 2021-12-20 DIAGNOSIS — R972 Elevated prostate specific antigen [PSA]: Secondary | ICD-10-CM

## 2021-12-23 ENCOUNTER — Other Ambulatory Visit: Payer: Self-pay | Admitting: Internal Medicine

## 2021-12-23 DIAGNOSIS — M19049 Primary osteoarthritis, unspecified hand: Secondary | ICD-10-CM

## 2021-12-25 ENCOUNTER — Encounter (HOSPITAL_COMMUNITY): Payer: Self-pay | Admitting: Licensed Clinical Social Worker

## 2021-12-25 ENCOUNTER — Other Ambulatory Visit: Payer: Self-pay | Admitting: Orthopaedic Surgery

## 2021-12-25 ENCOUNTER — Ambulatory Visit (INDEPENDENT_AMBULATORY_CARE_PROVIDER_SITE_OTHER): Payer: No Typology Code available for payment source | Admitting: Licensed Clinical Social Worker

## 2021-12-25 DIAGNOSIS — M5416 Radiculopathy, lumbar region: Secondary | ICD-10-CM

## 2021-12-25 DIAGNOSIS — M47816 Spondylosis without myelopathy or radiculopathy, lumbar region: Secondary | ICD-10-CM

## 2021-12-25 DIAGNOSIS — F319 Bipolar disorder, unspecified: Secondary | ICD-10-CM | POA: Diagnosis not present

## 2021-12-25 DIAGNOSIS — F411 Generalized anxiety disorder: Secondary | ICD-10-CM

## 2021-12-25 DIAGNOSIS — M4326 Fusion of spine, lumbar region: Secondary | ICD-10-CM

## 2021-12-25 NOTE — Progress Notes (Signed)
Virtual virtual Video Note ? ?I connected with Alex [name] on 12/25/2021 at 3:00pm EST by video-enabled virtual visit. I verified that I am speaking with the correct person using  two identifiers.I discussed the limitations of evaluation and management by telemedicine and the availability of in person appointments. The patient expressed understanding and agreed to proceed.  ? ? ?LOCATION: ?Patient: Home  ?Provider: Home office ? ? ?History of Present Illness:  Pt was referred by Dr. Adele Schilder for OP therapy for bipolar disorder and anxiety. ? ?Treatment Goal Addressed:  Pt will meet with clinician 1x every 2 weeks for therapy to monitor for progress towards goals and address any barriers to success; Reduce depression from average severity level of 6/10 down to a 4/10 in next 90 days by engaging in 1-2 positive coping skills fdaily as part of developing self-care routine; Reduce average anxiety level from 7/10 down to 5/10 in next 90 days by utilizing 1-2 relaxation skills/grounding skills per day, such as mindful breathing, progressive muscle relaxation, positive visualizations. ? ?Progress towards Treatment Goal: Progressing ?  ?Observations/Objective: Patient presented for today?s session on time and was alert, oriented x5, with no evidence or self-report of SI/HI or A/V H.  Patient reported ongoing compliance with medication and denied any use of alcohol or illicit substances.  Clinician inquired about patient?s current emotional ratings, as well as any significant changes in thoughts, feelings or behavior since previous session.  Patient reported scores of  6/10 for depression, 6/10 for anxiety, 6/10 for anger/irritability. Pt explored his emotional ratings, which remain high. Pt provided an update on his children, health issues and family. "My moods are still mixed." [name] asked open ended questions. [name] and pt explored alternatives to help resolve family issues: family meetings, required rent/phone/insurance  for adult children, boundaries. [name] utilized DBT Radical Acceptance to assist patient in accepting life as is. ? ? ? ? ? ? ?Collaboration of care: none needed at this session ? ? ? ?Patient/Guardian was advised Release of Information must be obtained prior to any record release in order to collaborate their care with an outside provider. Patient/Guardian was advised if they have not already done so to contact the registration department to sign all necessary forms in order for [place] to release information regarding their care.  ?  ?Consent: Patient/Guardian gives verbal consent for treatment and assignment of benefits for services provided during this visit. Patient/Guardian expressed understanding and agreed to proceed.   ? ? ?Follow Up Instructions: ? ?I discussed the assessment and treatment plan with the patient. The patient was provided an opportunity to ask questions and all were answered. The patient agreed with the plan and demonstrated an understanding of the instructions. ?  ?The patient was advised to call back or seek an in-person evaluation if the symptoms worsen or if the condition fails to improve as anticipated. ? ?I provided 60 minutes of non-face-to-face time during this encounter. ? ? ?Trevin Gartrell S, LCAS ? ? ?

## 2021-12-27 ENCOUNTER — Ambulatory Visit
Admission: RE | Admit: 2021-12-27 | Discharge: 2021-12-27 | Disposition: A | Discharge status: Home / Self Care | Payer: No Typology Code available for payment source | Source: Ambulatory Visit | Attending: Orthopaedic Surgery | Admitting: Orthopaedic Surgery

## 2021-12-27 DIAGNOSIS — M5416 Radiculopathy, lumbar region: Secondary | ICD-10-CM

## 2021-12-27 DIAGNOSIS — M47816 Spondylosis without myelopathy or radiculopathy, lumbar region: Secondary | ICD-10-CM

## 2021-12-27 DIAGNOSIS — M4326 Fusion of spine, lumbar region: Secondary | ICD-10-CM

## 2021-12-28 ENCOUNTER — Other Ambulatory Visit: Payer: Self-pay | Admitting: Internal Medicine

## 2022-01-01 ENCOUNTER — Ambulatory Visit (INDEPENDENT_AMBULATORY_CARE_PROVIDER_SITE_OTHER): Payer: No Typology Code available for payment source | Admitting: Licensed Clinical Social Worker

## 2022-01-01 DIAGNOSIS — F319 Bipolar disorder, unspecified: Secondary | ICD-10-CM

## 2022-01-06 ENCOUNTER — Other Ambulatory Visit: Payer: Self-pay | Admitting: Internal Medicine

## 2022-01-08 ENCOUNTER — Ambulatory Visit
Admission: RE | Admit: 2022-01-08 | Discharge: 2022-01-08 | Disposition: A | Discharge status: Home / Self Care | Payer: No Typology Code available for payment source | Source: Ambulatory Visit | Attending: Urology | Admitting: Urology

## 2022-01-08 ENCOUNTER — Ambulatory Visit (INDEPENDENT_AMBULATORY_CARE_PROVIDER_SITE_OTHER): Payer: No Typology Code available for payment source | Admitting: Licensed Clinical Social Worker

## 2022-01-08 DIAGNOSIS — R972 Elevated prostate specific antigen [PSA]: Secondary | ICD-10-CM

## 2022-01-08 DIAGNOSIS — F319 Bipolar disorder, unspecified: Secondary | ICD-10-CM

## 2022-01-08 MED ORDER — GADOBENATE DIMEGLUMINE 529 MG/ML IV SOLN
15.0000 mL | Freq: Once | INTRAVENOUS | Status: AC | PRN
  Administered 2022-01-08: 15 mL via INTRAVENOUS

## 2022-01-09 ENCOUNTER — Encounter (HOSPITAL_COMMUNITY): Payer: Self-pay | Admitting: Licensed Clinical Social Worker

## 2022-01-09 ENCOUNTER — Ambulatory Visit: Payer: No Typology Code available for payment source | Admitting: Internal Medicine

## 2022-01-09 NOTE — Progress Notes (Signed)
Virtual virtual Video Note  I connected with Alex [name] on 01/08/2022 at 3:00pm EST by video-enabled virtual visit. I verified that I am speaking with the correct person using  two identifiers.I discussed the limitations of evaluation and management by telemedicine and the availability of in person appointments. The patient expressed understanding and agreed to proceed.    LOCATION: Patient: Home  Provider: Home office   History of Present Illness:  Pt was referred by Dr. Adele Schilder for OP therapy for bipolar disorder and anxiety.  Treatment Goal Addressed:  Pt will meet with clinician 1x every 2 weeks for therapy to monitor for progress towards goals and address any barriers to success; Reduce depression from average severity level of 6/10 down to a 4/10 in next 90 days by engaging in 1-2 positive coping skills fdaily as part of developing self-care routine; Reduce average anxiety level from 7/10 down to 5/10 in next 90 days by utilizing 1-2 relaxation skills/grounding skills per day, such as mindful breathing, progressive muscle relaxation, positive visualizations.  Progress towards Treatment Goal: Progressing   Observations/Objective: Patient presented for today's session on time and was alert, oriented x5, with no evidence or self-report of SI/HI or A/V H.  Patient reported ongoing compliance with medication and denied any use of alcohol or illicit substances.  Clinician inquired about patient's current emotional ratings, as well as any significant changes in thoughts, feelings or behavior since previous session.  Patient reported scores of  6/10 for depression, 6/10 for anxiety, 6/10 for anger/irritability. Pt explored his emotional ratings, which remain high. Pt provided an update on his children, health issues and family. "My moods are still mixed. It's like I have nothing positive in my life." [name] used CBT to assist in identifying positives to assess how his mood has been impacted. Clinician  allowed space for patient to further process emotions and clinician provided supportive statements throughout session. It was suggested to patient to continue to identify positives that  impact mood daily. [name] provided education on how to to recognize the physical, cognitive, emotional, and behavioral responses that influence his mood.[name] suggested a gratitude journal.           Collaboration of care: Continue medication management with Dr. Adele Schilder    Patient/Guardian was advised Release of Information must be obtained prior to any record release in order to collaborate their care with an outside provider. Patient/Guardian was advised if they have not already done so to contact the registration department to sign all necessary forms in order for [place] to release information regarding their care.    Consent: Patient/Guardian gives verbal consent for treatment and assignment of benefits for services provided during this visit. Patient/Guardian expressed understanding and agreed to proceed.     Follow Up Instructions:  I discussed the assessment and treatment plan with the patient. The patient was provided an opportunity to ask questions and all were answered. The patient agreed with the plan and demonstrated an understanding of the instructions.   The patient was advised to call back or seek an in-person evaluation if the symptoms worsen or if the condition fails to improve as anticipated.  I provided 60 minutes of non-face-to-face time during this encounter.   Gean Laursen S, LCAS

## 2022-01-16 NOTE — Progress Notes (Signed)
Office Visit    Patient Name: Alex [name] Date of Encounter: 01/16/2022  Primary Care Provider:  Cassandria Anger, MD Primary Cardiologist:  [name], MD Primary Electrophysiologist: None  Chief Complaint    Alex [name] is a 66 y.o. male with PMH of HTN, HLD nonobstructive CAD following LHC in 2007, dilated ascending aorta 43 mm, thyroid cancer s/p thyroidectomy, BPH, GERD, presents today for follow-up of hypertension.  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   Cataract    LEFT EYE   Chronic back pain    Complication of anesthesia POST URINARY RETENTION---  2006 SHOULDER SURGERY MARKED BRADYCARDIA VAGAL RESPONSE NO ISSUE W/ SURGERY AFTER THIS ONE   WITH GENERAL ANESTHESIA, 15 YRS AGO VASOVAGAL REACTION NONE SINCE   Corneal hemorrhage 06/03/2018   Entire left eye   Coronary atherosclerosis CARDIOLOGIST- DR CRENSHAW--  LAST VISIT 01-05-2012 IN EPIC   NON-OBSTRUCTIVE MILD DISEASE   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HCC)    Depression    Epicondylitis    right elbow   GERD (gastroesophageal reflux disease)    Glaucoma BOTH EYES   RIGHT EYE RADIATION DAMAGE   Hearing loss    BOTH EARS   Hearing loss    Bilateral   Hepatic cyst    Several, The lesion of concern in segment 6 of the liver has single large portal vein and hepatic vein branches extending to tt, in a pattern of enhancement  which mirrors these vascular structures. 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 9 CM X 4. 3 CM PER NOV 06-14-17  CHEST CTFOLOWED BY DR Stanford Breed YEARLY FOR   Positional vertigo    HX OF WITH SINUS INFECTIONS   Radial head fracture    Right   Squamous cell carcinoma of skin 06/04/2016   in situ-crown of scalp   Squamous cell carcinoma  of skin 04/22/2017   in situ-crown scalp (txpbx)   Thoracic aortic aneurysm ([place]) 06/14/2017   last CT 4.1 CM Mild   Tinnitus    CONSTANT   Ulnar nerve compression    right elbow   Unsteady gait    especially with stairs, depth perception off   Urinary hesitancy    Wears glasses    Past Surgical History:  Procedure Laterality Date   CARDIAC CATHETERIZATION  01-16-2006  DR Marcello Moores WALL   MILD CORONARY ATHEROSCLEROSIS/ MID TO DISTAL LAD 40% STENOSIS/ LVF 50-55%   CARPAL TUNNEL RELEASE Right 11/03/2017   Procedure: RIGHT HAND CARPAL TUNNEL RELEASE;  Surgeon: Iran Planas, MD;  Location: [facility];  Service: Orthopedics;  Laterality: Right;   CATARACT EXTRACTION Right    COLONSCOPY  2017 LAST DONE   MULTIPLE    ENDOSCOPY  LAST 2017   MULTIPLE DONE DILATION DONE ALSO   ESOPHAGOGASTRODUODENOSCOPY (EGD) WITH PROPOFOL N/A 02/24/2018   Procedure: ESOPHAGOGASTRODUODENOSCOPY (EGD) WITH PROPOFOL;  Surgeon: Laurence Spates, MD;  Location: [facility];  Service: Endoscopy;  Laterality: N/A;   EXCISION RADIAL HEAD Right 11/03/2017   Procedure: RIGHT PROXIMAL RADIUS RADIAL HEAD RESECTION AND JOINT DEBRIDEMENT;  Surgeon: Iran Planas, MD;  Location: [facility];  Service: Orthopedics;  Laterality: Right;   EXTRACORPOREAL SHOCK WAVE LITHOTRIPSY Right 11/20/2020   Procedure: EXTRACORPOREAL SHOCK WAVE LITHOTRIPSY (ESWL);  Surgeon: Alexis Frock, MD;  Location: [facility];  Service: Urology;  Laterality: Right;  75 MINS   KNEE ARTHROSCOPY  05/01/2012   Procedure: ARTHROSCOPY KNEE;  Surgeon: Johnn Hai, MD;  Location: [facility];  Service: Orthopedics;  Laterality: Left;  debridement and removal of loose body   LEFT ANKLE ARTHROSCOPY W/ DEBRIDEMENT  05-12-2007   LEFT HYDROCELECTOMY  03-29-2005   AND REPAIR LEFT INGUINAL HERNIA W/ MESH   MOHS SURGERY  2002   RIGHT ORBITAL CANCER   NASAL ENDOSCOPY  08-07-2005   RIGHT EPISTAXIS  / POST SEPTOPLASTY  (HX RIGHT ORBITAL CA & S/P RADIATION/ NECROSIS ANTERIOR END OF BOTH INFERIOR TURBINATES)   occuloplastic surgery  2002   PARS PLANA VITRECTOMY  11-06-2004   RIGHT EYE RADIATION RETINOPATHY W/ HEMORRHAGE   RADIAL HEAD ARTHROPLASTY Right 06/15/2018   Procedure: RIGHT ELBOW PROXIMAL RADIOULNAR JOINT DEBRIDEMENT AND ARTHROPLASTY;  Surgeon: Iran Planas, MD;  Location: [place];  Service: Orthopedics;  Laterality: Right;  BLOCK WITH SEDATION   REPAIR UNDESENDED RIGHT TESTICLE / RIGHT INGUINAL HERNIA  AGE 28   RIGHT ANKLE ARTHROSCOPY W/ EXTENSIVE DEBRIDEMENT  04/05/2008   x2   RIGHT SHOULDER SURGERY  2006   RIGHT SUPRAOMOHYOID NECK DISSECTION   03-08-2003   ZONES 1,2,3;   SUBMANDIBULAR MASS / METASTATIC SQUAMOUS  CELL CARCINOMA RIGHT NECK   SAVORY DILATION N/A 02/24/2018   Procedure: SAVORY DILATION;  Surgeon: Laurence Spates, MD;  Location: [facility];  Service: Endoscopy;  Laterality: N/A;   SEPTOPLASTY  NOV 2006   SHOULDER ARTHROSCOPY Left    SHOULDER ARTHROSCOPY W/ SUBACROMIAL DECOMPRESSION AND DISTAL CLAVICLE EXCISION  10-09-2008   AND DEBRIDEMENT OF RIGHT SHOULDER [facility] JOINT ARTHRITIS   SPINE SURGERY  2016   l 3 TO l 4 PLATE AND SCREWS   TOTAL THYROIDECTOMY  11-03-2001   PAPILLARY THYROID CARCINOMA   TRANSTHORACIC ECHOCARDIOGRAM  12/ 2012   grade I diastolic dysfunction/ ef 96-29%   ULNAR NERVE TRANSPOSITION Right 04/28/2014   Procedure: RIGHT ELBOW ULNA NERVE RELEASE TRANSPOSTION AND MEDIAL  EPICONDYLAR DEBRIDEMENT AND REPAIR;  Surgeon: Linna Hoff, MD;  Location: [facility];  Service: Orthopedics;  Laterality: Right;    Allergies  Allergies  Allergen Reactions   Percocet [Oxycodone-Acetaminophen] Itching    Can take generic.  States only has a problem with percocet brand, also headache    History of Present Illness    TOBIAH CELESTINE is a 66 year old male with the above mentioned past medical history who presents today for follow-up of nonobstructive CAD and hypertension.  Patient was originally seen in 2006 following complaint of chest pain and had adenosine Myoview completed with no ischemia.  He was later seen in the ED for chest pain in June 2007 and cardiac cath was performed by Dr. Verl Blalock which showed nonobstructive CAD with 40% mid distal stenosis and EF of 50-55%.  Most current 2D echo was performed 07/2011 showed normal LV function with grade 1 DD. ETT 7/11: Exercised for 10 minutes, normal study..  In 08/2012 48-hour Holter monitor was ordered due to palpitation that revealed NSR with PAC,PVC's and brief PAT. cardiac CTA performed 2018 revealed 4.1 cm TAA that is currently being followed by Dr. Cyndia Bent.  He was last seen by Dr. Stanford Breed on 02/2021 for  annual follow-up.  Patient reported doing well with no complaints of chest pain.  He did report some dyspnea with activity that he states may be contributed to deconditioning.  Patient was encouraged to take his amlodipine daily and follow his blood pressures at home with adjustments as needed.  Since last being seen in the office patient reports that he has been doing well since his last appointment and July 2022.  He is currently followed by Dr. Cyndia Bent for thoracic aortic aneurysm and reports that he no longer needs to follow-up yearly as of last appointment.  He is compliant with his current medication regimen reports no medication side effects.  He does report that his most recent PSA was elevated and he is currently undergoing biopsy for possible prostate cancer.  Patient denies chest pain, palpitations, dyspnea, PND, orthopnea, nausea, vomiting, dizziness, syncope, edema, weight gain, or early satiety.  Home Medications    Current Outpatient Medications  Medication Sig Dispense Refill   amLODipine (NORVASC) 5 MG tablet Take 1 tablet (5 mg total) by mouth daily. 90 tablet 3   aspirin EC 81 MG tablet Take 1 tablet by mouth daily.     atorvastatin (LIPITOR) 40 MG tablet Take 1 tablet (40 mg total) by mouth daily. 90 tablet 3   AXIRON 30 MG/ACT SOLN Place 2 Act onto the skin daily.     chlorhexidine (HIBICLENS) 4 % external liquid Apply topically daily as needed. Clean hands, skin in the shower 1 night prior 500 mL 0   Coenzyme Q10 (CO Q 10 PO) Take 1 capsule by mouth daily.      Immune Globulin, Human, 4 GM/20ML SOLN Inject into the skin once a week. wednesday     LamoTRIgine 300 MG TB24 24 hour tablet Take 1 tablet (300 mg total) by mouth every morning. 90 tablet 0   latanoprost (XALATAN) 0.005 % ophthalmic solution 1 DROP EACH EYE AT NIGHT     levothyroxine (SYNTHROID) 150 MCG tablet Take 1 tablet (150 mcg total) by mouth daily before breakfast. 90 tablet 2   LORazepam (ATIVAN) 0.5 MG tablet  Take one tab daily and 2nd if needed for anxiety 45 tablet 2   LUTEIN PO Take 1 tablet by mouth daily.  LYCOPENE PO Take by mouth.     meloxicam (MOBIC) 15 MG tablet TAKE 1 TABLET BY MOUTH EVERY DAY AS NEEDED 90 tablet 0   Multiple Vitamin (MULTI-VITAMINS) TABS Take by mouth.     mupirocin ointment (BACTROBAN) 2 % Apply topically 4 (four) times daily. As directed 30 g 0   OMEGA-3 KRILL OIL PO Take 1 capsule by mouth daily.      omeprazole (PRILOSEC) 20 MG capsule Take 20 mg by mouth every evening.     sildenafil (REVATIO) 20 MG tablet Take 1 tablet by mouth once daily 30 tablet 0   sulfamethoxazole-trimethoprim (BACTRIM DS) 800-160 MG tablet Take 1 tablet by mouth 2 (two) times daily. 20 tablet 0   tadalafil (CIALIS) 20 MG tablet TAKE 1 TABLET BY MOUTH ONCE DAILY AS NEEDED FOR ERECTILE DYSFUNCTION (Patient taking differently: 5 mg.) 30 tablet 2   tamsulosin (FLOMAX) 0.4 MG CAPS capsule TAKE 1 CAPSULE BY MOUTH EVERY DAY (Patient taking differently: as needed.) 90 capsule 1   telmisartan (MICARDIS) 80 MG tablet Take 1 tablet (80 mg total) by mouth daily. Keep scheduled appointment for further refills 30 tablet 0   timolol (TIMOPTIC) 0.5 % ophthalmic solution timolol maleate 0.5 % eye drops  INSTILL 1 DROP INTO RIGHT EYE TWICE A DAY     zolpidem (AMBIEN) 10 MG tablet TAKE 1/2 (ONE-HALF) TABLET BY MOUTH AT BEDTIME AND  TAKE  THE  OTHER  HALF  IF  NEEDED 30 tablet 1   No current facility-administered medications for this visit.     Review of Systems  Please see the history of present illness.    (+) Chronic left ankle swelling All other systems reviewed and are otherwise negative except as noted above.  Physical Exam    Wt Readings from Last 3 Encounters:  10/03/21 187 lb (84.8 kg)  06/14/21 177 lb 9.6 oz (80.6 kg)  05/16/21 180 lb 3.2 oz (81.7 kg)   MW:UXLKG were no vitals filed for this visit.,There is no height or weight on file to calculate BMI.  Constitutional:      Appearance:  Healthy appearance. Not in distress.  Neck:     Vascular: JVD normal.  Pulmonary:     Effort: Pulmonary effort is normal.     Breath sounds: No wheezing. No rales. Diminished in the bases Cardiovascular:     Normal rate. Regular rhythm. Normal S1. Normal S2.      Murmurs: There is no murmur.  Edema:    Trace lower extremity edema. Abdominal:     Palpations: Abdomen is soft non tender. There is no hepatomegaly.  Skin:    General: Skin is warm and dry.  Neurological:     General: No focal deficit present.     Mental Status: Alert and oriented to person, place and time.     Cranial Nerves: Cranial nerves are intact.  EKG/LABS/Other Studies Reviewed    ECG personally reviewed by me today -normal sinus rhythm with rate of 67 and no acute changes  Lab Results  Component Value Date   WBC 10.3 06/14/2021   HGB 13.2 06/14/2021   HCT 41.0 06/14/2021   MCV 81.5 06/14/2021   PLT 354.0 06/14/2021   Lab Results  Component Value Date   CREATININE 1.00 05/14/2021   BUN 20 05/14/2021   NA 140 05/14/2021   K 4.3 05/14/2021   CL 108 05/14/2021   CO2 23 05/14/2021   Lab Results  Component Value Date   ALT 41  05/14/2021   AST 36 05/14/2021   ALKPHOS 85 05/14/2021   BILITOT 0.8 05/14/2021   Lab Results  Component Value Date   CHOL 180 10/30/2020   HDL 84 10/30/2020   LDLCALC 79 10/30/2020   LDLDIRECT 59.0 02/02/2016   TRIG 98 10/30/2020   CHOLHDL 2.1 10/30/2020    Lab Results  Component Value Date   HGBA1C 5.5 09/29/2018    Assessment & Plan    1.  Coronary artery disease: -Patient reports no chest pain or anginal complaints at this time -Continue aspirin 81 mg daily and atorvastatin 40 mg daily  2.  Hypertension: -Patient's blood pressure today was 124/84 well-controlled and less than 130/80 -Continue amlodipine 5 mg daily and Micardis 80 mg daily  3.  Thoracic aortic aneurysm: -Dr. Cyndia Bent previously stated that patient longer has to follow-up yearly with his  office. -Annual cardiac CTA order placed with plan for patient to have scan completed in the next 2 months BMET prior to examination  4.  Hyperlipidemia: -Patient's last LDL was 59 in 2022 -LFTs and fasting lipids to be complete within the next week due to patient not fasting today -Please continue atorvastatin 40 mg daily  Disposition: Follow-up with [name], MD or APP in 12 months    Medication Adjustments/Labs and Tests Ordered: Current medicines are reviewed at length with the patient today.  Concerns regarding medicines are outlined above.   Signed, Mable Fill, Marissa Nestle, NP 01/16/2022, 5:20 PM [place]

## 2022-01-17 ENCOUNTER — Encounter: Payer: Self-pay | Admitting: Nurse Practitioner

## 2022-01-17 ENCOUNTER — Ambulatory Visit (INDEPENDENT_AMBULATORY_CARE_PROVIDER_SITE_OTHER): Payer: No Typology Code available for payment source | Admitting: Nurse Practitioner

## 2022-01-17 VITALS — BP 128/90 | HR 67 | Ht 72.0 in | Wt 179.0 lb

## 2022-01-17 DIAGNOSIS — I7121 Aneurysm of the ascending aorta, without rupture: Secondary | ICD-10-CM | POA: Diagnosis not present

## 2022-01-17 DIAGNOSIS — I10 Essential (primary) hypertension: Secondary | ICD-10-CM | POA: Diagnosis not present

## 2022-01-17 DIAGNOSIS — E7800 Pure hypercholesterolemia, unspecified: Secondary | ICD-10-CM

## 2022-01-17 DIAGNOSIS — I2510 Atherosclerotic heart disease of native coronary artery without angina pectoris: Secondary | ICD-10-CM | POA: Diagnosis not present

## 2022-01-17 DIAGNOSIS — Z01818 Encounter for other preprocedural examination: Secondary | ICD-10-CM

## 2022-01-17 NOTE — Patient Instructions (Addendum)
Medication Instructions:  Your physician recommends that you continue on your current medications as directed. Please refer to the Current Medication list given to you today.  *If you need a refill on your cardiac medications before your next appointment, please call your pharmacy*  Lab Work: Your physician recommends that you return for lab work 1 week prior to Ct of chest:  BMET   Your physician recommends that you return for lab work Tomorrow:  Fasting Lipid Panel-DO NOT EAT or DRINK past midnight. Okay to have water to drink the morning of labs. Hepatic (Liver) Function Test   If you have labs (blood work) drawn today and your tests are completely normal, you will receive your results only by: MyChart Message (if you have MyChart) OR A paper copy in the mail If you have any lab test that is abnormal or we need to change your treatment, we will call you to review the results.  Testing/Procedures: Non-Cardiac CT scanning, (CAT scanning), is a noninvasive, special x-ray that produces cross-sectional images of the body using x-rays and a computer. CT scans help physicians diagnose and treat medical conditions. For some CT exams, a contrast material is used to enhance visibility in the area of the body being studied. CT scans provide greater clarity and reveal more details than regular x-ray exams.  Follow-Up: At [facility], you and your health needs are our priority.  As part of our continuing mission to provide you with exceptional heart care, we have created designated Provider Care Teams.  These Care Teams include your primary Cardiologist (physician) and Advanced Practice Providers (APPs -  Physician Assistants and Nurse Practitioners) who all work together to provide you with the care you need, when you need it.    Your next appointment:   1 year(s)  The format for your next appointment:   In Person  Provider:   [name], MD     Other Instructions   Important  Information About Sugar

## 2022-01-18 ENCOUNTER — Other Ambulatory Visit: Payer: Self-pay

## 2022-01-18 MED ORDER — TELMISARTAN 80 MG PO TABS: 80.0000 mg | ORAL_TABLET | Freq: Every day | ORAL | 3 refills | Status: DC

## 2022-01-18 NOTE — Telephone Encounter (Signed)
Pt came into the office today requesting a refill on Micardis. Pt was seen yesterday. Refill sent to pharmacy.

## 2022-01-18 NOTE — Telephone Encounter (Signed)
Patient walked into office requesting a refill on Micardis Medication.

## 2022-01-22 ENCOUNTER — Ambulatory Visit (INDEPENDENT_AMBULATORY_CARE_PROVIDER_SITE_OTHER): Payer: No Typology Code available for payment source | Admitting: Licensed Clinical Social Worker

## 2022-01-22 ENCOUNTER — Encounter (HOSPITAL_COMMUNITY): Payer: Self-pay | Admitting: Licensed Clinical Social Worker

## 2022-01-22 DIAGNOSIS — F411 Generalized anxiety disorder: Secondary | ICD-10-CM

## 2022-01-22 DIAGNOSIS — F319 Bipolar disorder, unspecified: Secondary | ICD-10-CM | POA: Diagnosis not present
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
## 2022-01-24 ENCOUNTER — Other Ambulatory Visit: Payer: Self-pay | Admitting: Internal Medicine

## 2022-01-30 ENCOUNTER — Ambulatory Visit (HOSPITAL_COMMUNITY): Payer: No Typology Code available for payment source | Admitting: Licensed Clinical Social Worker

## 2022-02-05 ENCOUNTER — Encounter (HOSPITAL_COMMUNITY): Payer: Self-pay | Admitting: Licensed Clinical Social Worker

## 2022-02-05 ENCOUNTER — Ambulatory Visit (INDEPENDENT_AMBULATORY_CARE_PROVIDER_SITE_OTHER): Payer: No Typology Code available for payment source | Admitting: Licensed Clinical Social Worker

## 2022-02-05 DIAGNOSIS — F319 Bipolar disorder, unspecified: Secondary | ICD-10-CM | POA: Diagnosis not present

## 2022-02-06 ENCOUNTER — Encounter (INDEPENDENT_AMBULATORY_CARE_PROVIDER_SITE_OTHER): Payer: No Typology Code available for payment source | Admitting: Ophthalmology

## 2022-02-13 ENCOUNTER — Ambulatory Visit: Payer: No Typology Code available for payment source | Admitting: Dermatology

## 2022-02-25 ENCOUNTER — Ambulatory Visit (INDEPENDENT_AMBULATORY_CARE_PROVIDER_SITE_OTHER): Payer: No Typology Code available for payment source | Admitting: Dermatology

## 2022-02-25 DIAGNOSIS — L720 Epidermal cyst: Secondary | ICD-10-CM

## 2022-02-25 DIAGNOSIS — R202 Paresthesia of skin: Secondary | ICD-10-CM

## 2022-02-25 DIAGNOSIS — L570 Actinic keratosis: Secondary | ICD-10-CM

## 2022-02-25 DIAGNOSIS — L821 Other seborrheic keratosis: Secondary | ICD-10-CM

## 2022-02-25 NOTE — Progress Notes (Signed)
   Follow-Up Visit   Subjective  Alex [name] is a 66 y.o. male who presents for the following: Follow-up (3 mth f/u- on top of scalp - typically get LN2 ).  Several new crusts, lump on scalp, itching on back Location:  Duration:  Quality:  Associated Signs/Symptoms: Modifying Factors:  Severity:  Timing: Context:   Objective  Well appearing patient in no apparent distress; mood and affect are within normal limits.   All skin waist up examined.   Assessment & Plan    Seborrheic keratosis  Epidermal cyst Mid Occipital Scalp  AK (actinic keratosis) (3) Right Eyebrow; Left Buccal Cheek; Right Forehead  Destruction of lesion - Left Buccal Cheek, Right Eyebrow, Right Forehead Complexity: simple   Destruction method: cryotherapy   Informed consent: discussed and consent obtained   Timeout:  patient name, date of birth, surgical site, and procedure verified Lesion destroyed using liquid nitrogen: Yes   Cryotherapy cycles:  3 Outcome: patient tolerated procedure well with no complications    Notalgia paresthetica Mid Back    2 small scaly spots on lower lip are clinically nonspecific, not suggestive of HSV.  He will try over-the-counter 1% hydrocortisone ointment nightly for 2 weeks.  I, [name], MD, have reviewed all documentation for this visit.  The documentation on 02/25/22 for the exam, diagnosis, procedures, and orders are all accurate and complete.

## 2022-02-26 ENCOUNTER — Ambulatory Visit (HOSPITAL_COMMUNITY): Payer: No Typology Code available for payment source | Admitting: Licensed Clinical Social Worker

## 2022-02-26 ENCOUNTER — Ambulatory Visit (INDEPENDENT_AMBULATORY_CARE_PROVIDER_SITE_OTHER): Payer: No Typology Code available for payment source | Admitting: Internal Medicine

## 2022-02-26 ENCOUNTER — Encounter: Payer: Self-pay | Admitting: Internal Medicine

## 2022-02-26 VITALS — BP 130/82 | HR 73 | Temp 98.4°F | Ht 72.0 in | Wt 177.0 lb

## 2022-02-26 DIAGNOSIS — Z860100 Personal history of colon polyps, unspecified: Secondary | ICD-10-CM | POA: Insufficient documentation

## 2022-02-26 DIAGNOSIS — C61 Malignant neoplasm of prostate: Secondary | ICD-10-CM | POA: Diagnosis not present

## 2022-02-26 DIAGNOSIS — E785 Hyperlipidemia, unspecified: Secondary | ICD-10-CM

## 2022-02-26 DIAGNOSIS — K5730 Diverticulosis of large intestine without perforation or abscess without bleeding: Secondary | ICD-10-CM | POA: Insufficient documentation

## 2022-02-26 DIAGNOSIS — F319 Bipolar disorder, unspecified: Secondary | ICD-10-CM | POA: Insufficient documentation

## 2022-02-26 DIAGNOSIS — D849 Immunodeficiency, unspecified: Secondary | ICD-10-CM | POA: Insufficient documentation

## 2022-02-26 DIAGNOSIS — J479 Bronchiectasis, uncomplicated: Secondary | ICD-10-CM | POA: Insufficient documentation

## 2022-02-26 DIAGNOSIS — K862 Cyst of pancreas: Secondary | ICD-10-CM | POA: Insufficient documentation

## 2022-02-26 DIAGNOSIS — M353 Polymyalgia rheumatica: Secondary | ICD-10-CM

## 2022-02-26 DIAGNOSIS — N400 Enlarged prostate without lower urinary tract symptoms: Secondary | ICD-10-CM | POA: Insufficient documentation

## 2022-02-26 DIAGNOSIS — M419 Scoliosis, unspecified: Secondary | ICD-10-CM | POA: Insufficient documentation

## 2022-02-26 DIAGNOSIS — Z800 Family history of malignant neoplasm of digestive organs: Secondary | ICD-10-CM | POA: Insufficient documentation

## 2022-02-26 DIAGNOSIS — D803 Selective deficiency of immunoglobulin G [IgG] subclasses: Secondary | ICD-10-CM | POA: Insufficient documentation

## 2022-02-26 DIAGNOSIS — K2940 Chronic atrophic gastritis without bleeding: Secondary | ICD-10-CM | POA: Insufficient documentation

## 2022-02-26 DIAGNOSIS — M48061 Spinal stenosis, lumbar region without neurogenic claudication: Secondary | ICD-10-CM | POA: Insufficient documentation

## 2022-02-26 DIAGNOSIS — R198 Other specified symptoms and signs involving the digestive system and abdomen: Secondary | ICD-10-CM | POA: Insufficient documentation

## 2022-02-26 DIAGNOSIS — K259 Gastric ulcer, unspecified as acute or chronic, without hemorrhage or perforation: Secondary | ICD-10-CM | POA: Insufficient documentation

## 2022-02-26 DIAGNOSIS — D839 Common variable immunodeficiency, unspecified: Secondary | ICD-10-CM

## 2022-02-26 DIAGNOSIS — G8929 Other chronic pain: Secondary | ICD-10-CM

## 2022-02-26 DIAGNOSIS — M5442 Lumbago with sciatica, left side: Secondary | ICD-10-CM | POA: Diagnosis not present

## 2022-02-26 DIAGNOSIS — E079 Disorder of thyroid, unspecified: Secondary | ICD-10-CM | POA: Insufficient documentation

## 2022-02-26 DIAGNOSIS — K589 Irritable bowel syndrome without diarrhea: Secondary | ICD-10-CM | POA: Insufficient documentation

## 2022-02-26 DIAGNOSIS — F419 Anxiety disorder, unspecified: Secondary | ICD-10-CM

## 2022-02-26 DIAGNOSIS — I998 Other disorder of circulatory system: Secondary | ICD-10-CM | POA: Insufficient documentation

## 2022-02-26 DIAGNOSIS — M5441 Lumbago with sciatica, right side: Secondary | ICD-10-CM

## 2022-02-26 LAB — CBC WITH DIFFERENTIAL/PLATELET
Basophils Absolute: 0.1 10*3/uL (ref 0.0–0.1)
Basophils Relative: 1.2 % (ref 0.0–3.0)
Eosinophils Absolute: 0.3 10*3/uL (ref 0.0–0.7)
Eosinophils Relative: 3.7 % (ref 0.0–5.0)
HCT: 46 % (ref 39.0–52.0)
Hemoglobin: 15 g/dL (ref 13.0–17.0)
Lymphocytes Relative: 15.7 % (ref 12.0–46.0)
Lymphs Abs: 1.2 10*3/uL (ref 0.7–4.0)
MCHC: 32.7 g/dL (ref 30.0–36.0)
MCV: 76.1 fl — ABNORMAL LOW (ref 78.0–100.0)
Monocytes Absolute: 0.9 10*3/uL (ref 0.1–1.0)
Monocytes Relative: 11.5 % (ref 3.0–12.0)
Neutro Abs: 5.1 10*3/uL (ref 1.4–7.7)
Neutrophils Relative %: 67.9 % (ref 43.0–77.0)
Platelets: 256 10*3/uL (ref 150.0–400.0)
RBC: 6.05 Mil/uL — ABNORMAL HIGH (ref 4.22–5.81)
RDW: 20 % — ABNORMAL HIGH (ref 11.5–15.5)
WBC: 7.5 10*3/uL (ref 4.0–10.5)

## 2022-02-26 LAB — COMPREHENSIVE METABOLIC PANEL
ALT: 34 U/L (ref 0–53)
AST: 30 U/L (ref 0–37)
Albumin: 4.2 g/dL (ref 3.5–5.2)
Alkaline Phosphatase: 72 U/L (ref 39–117)
BUN: 22 mg/dL (ref 6–23)
CO2: 26 mEq/L (ref 19–32)
Calcium: 8.5 mg/dL (ref 8.4–10.5)
Chloride: 104 mEq/L (ref 96–112)
Creatinine, Ser: 1.01 mg/dL (ref 0.40–1.50)
GFR: 76.57 mL/min (ref >60.00)
Glucose, Bld: 90 mg/dL (ref 70–99)
Potassium: 3.9 mEq/L (ref 3.5–5.1)
Sodium: 137 mEq/L (ref 135–145)
Total Bilirubin: 0.7 mg/dL (ref 0.2–1.2)
Total Protein: 6.7 g/dL (ref 6.0–8.3)

## 2022-02-26 LAB — TSH: TSH: 11.01 u[IU]/mL — ABNORMAL HIGH (ref 0.35–5.50)

## 2022-02-26 LAB — T4, FREE: Free T4: 0.83 ng/dL (ref 0.60–1.60)

## 2022-02-26 NOTE — Assessment & Plan Note (Signed)
Chronic: on IV IgG

## 2022-02-26 NOTE — Assessment & Plan Note (Addendum)
Chronic LBP - s/p LS fusion on May 21, 2021 - Dr Patrice Paradise. Doing better Planning RF ablation

## 2022-02-26 NOTE — Assessment & Plan Note (Signed)
Cont on Lorazepam prn  Potential benefits of a long term benzodiazepines  use as well as potential risks  and complications were explained to the patient and were aknowledged. 

## 2022-02-26 NOTE — Progress Notes (Signed)
Subjective:  Patient ID: Alex [name], male    DOB: 05/14/1956  Age: 66 y.o. MRN: [id number]  CC: No chief complaint on file.   HPI Alex [name] Flow presents for a new prostate CA F/u LBP, HTN, anxiety  Outpatient Medications Prior to Visit  Medication Sig Dispense Refill   amLODipine (NORVASC) 5 MG tablet Take 1 tablet (5 mg total) by mouth daily. 90 tablet 3   aspirin EC 81 MG tablet Take 1 tablet by mouth daily.     atorvastatin (LIPITOR) 40 MG tablet Take 1 tablet (40 mg total) by mouth daily. 90 tablet 3   AXIRON 30 MG/ACT SOLN Place 2 Act onto the skin daily.     Coenzyme Q10 (CO Q 10 PO) Take 1 capsule by mouth daily.      Immune Globulin, Human, 4 GM/20ML SOLN Inject into the skin once a week. wednesday     LamoTRIgine 300 MG TB24 24 hour tablet Take 1 tablet (300 mg total) by mouth every morning. 90 tablet 0   latanoprost (XALATAN) 0.005 % ophthalmic solution 1 DROP EACH EYE AT NIGHT     levothyroxine (SYNTHROID) 150 MCG tablet Take 1 tablet (150 mcg total) by mouth daily before breakfast. 90 tablet 2   LORazepam (ATIVAN) 0.5 MG tablet Take one tab daily and 2nd if needed for anxiety 45 tablet 2   meloxicam (MOBIC) 15 MG tablet TAKE 1 TABLET BY MOUTH EVERY DAY AS NEEDED 90 tablet 0   Multiple Vitamin (MULTI-VITAMINS) TABS Take by mouth.     OMEGA-3 KRILL OIL PO Take 1 capsule by mouth daily.      omeprazole (PRILOSEC) 20 MG capsule Take 20 mg by mouth every evening.     sildenafil (REVATIO) 20 MG tablet Take 1 tablet by mouth once daily 30 tablet 0   tadalafil (CIALIS) 20 MG tablet TAKE 1 TABLET BY MOUTH ONCE DAILY AS NEEDED FOR ERECTILE DYSFUNCTION (Patient taking differently: 5 mg.) 30 tablet 2   tamsulosin (FLOMAX) 0.4 MG CAPS capsule TAKE 1 CAPSULE BY MOUTH EVERY DAY (Patient taking differently: as needed.) 90 capsule 1   telmisartan (MICARDIS) 80 MG tablet Take 1 tablet (80 mg total) by mouth daily. Keep scheduled appointment for further refills 90 tablet 3   timolol  (TIMOPTIC) 0.5 % ophthalmic solution timolol maleate 0.5 % eye drops  INSTILL 1 DROP INTO RIGHT EYE TWICE A DAY     zolpidem (AMBIEN) 10 MG tablet TAKE 1/2 (ONE-HALF) TABLET BY MOUTH AT BEDTIME AND  TAKE  THE  OTHER  HALF  IF  NEEDED 30 tablet 1   No facility-administered medications prior to visit.    ROS: Review of Systems  Constitutional:  Positive for fatigue. Negative for appetite change and unexpected weight change.  HENT:  Negative for congestion, nosebleeds, sneezing, sore throat and trouble swallowing.   Eyes:  Negative for itching and visual disturbance.  Respiratory:  Negative for cough.   Cardiovascular:  Negative for chest pain, palpitations and leg swelling.  Gastrointestinal:  Negative for abdominal distention, blood in stool, diarrhea and nausea.  Genitourinary:  Negative for frequency and hematuria.  Musculoskeletal:  Positive for arthralgias and back pain. Negative for gait problem, joint swelling and neck pain.  Skin:  Negative for rash.  Neurological:  Negative for dizziness, tremors, speech difficulty and weakness.  Psychiatric/Behavioral:  Positive for dysphoric mood. Negative for agitation, sleep disturbance and suicidal ideas. The patient is nervous/anxious.     Objective:  BP 130/82 (  BP Location: Left Arm, Patient Position: Sitting, Cuff Size: Normal)   Pulse 73   Temp 98.4 F (36.9 C) (Oral)   Ht 6' (1.829 m)   Wt 177 lb (80.3 kg)   SpO2 97%   BMI 24.01 kg/m   BP Readings from Last 3 Encounters:  02/26/22 130/82  01/17/22 128/90  10/03/21 132/81    Wt Readings from Last 3 Encounters:  02/26/22 177 lb (80.3 kg)  01/17/22 179 lb (81.2 kg)  10/03/21 187 lb (84.8 kg)    Physical Exam Constitutional:      General: He is not in acute distress.    Appearance: Normal appearance. He is well-developed.     Comments: NAD  Eyes:     Conjunctiva/sclera: Conjunctivae normal.     Pupils: Pupils are equal, round, and reactive to light.  Neck:      Thyroid: No thyromegaly.     Vascular: No JVD.  Cardiovascular:     Rate and Rhythm: Normal rate and regular rhythm.     Heart sounds: Normal heart sounds. No murmur heard.    No friction rub. No gallop.  Pulmonary:     Effort: Pulmonary effort is normal. No respiratory distress.     Breath sounds: Normal breath sounds. No wheezing or rales.  Chest:     Chest wall: No tenderness.  Abdominal:     General: Bowel sounds are normal. There is no distension.     Palpations: Abdomen is soft. There is no mass.     Tenderness: There is no abdominal tenderness. There is no guarding or rebound.  Musculoskeletal:        General: Tenderness present. Normal range of motion.     Cervical back: Normal range of motion.  Lymphadenopathy:     Cervical: No cervical adenopathy.  Skin:    General: Skin is warm and dry.     Findings: No rash.  Neurological:     Mental Status: He is alert and oriented to person, place, and time.     Cranial Nerves: No cranial nerve deficit.     Motor: No abnormal muscle tone.     Coordination: Coordination normal.     Gait: Gait normal.     Deep Tendon Reflexes: Reflexes are normal and symmetric.  Psychiatric:        Behavior: Behavior normal.        Thought Content: Thought content normal.        Judgment: Judgment normal.     Lab Results  Component Value Date   WBC 10.3 06/14/2021   HGB 13.2 06/14/2021   HCT 41.0 06/14/2021   PLT 354.0 06/14/2021   GLUCOSE 95 05/14/2021   CHOL 180 10/30/2020   TRIG 98 10/30/2020   HDL 84 10/30/2020   LDLDIRECT 59.0 02/02/2016   LDLCALC 79 10/30/2020   ALT 41 05/14/2021   AST 36 05/14/2021   NA 140 05/14/2021   K 4.3 05/14/2021   CL 108 05/14/2021   CREATININE 1.00 05/14/2021   BUN 20 05/14/2021   CO2 23 05/14/2021   TSH 3.84 04/24/2021   PSA 5.19 (H) 06/14/2021   INR 1.0 09/13/2019   HGBA1C 5.5 09/29/2018    MR PROSTATE W WO CONTRAST  Result Date: 01/08/2022 CLINICAL DATA:  Elevated PSA. EXAM: MR PROSTATE  WITHOUT AND WITH CONTRAST TECHNIQUE: Multiplanar multisequence MRI images were obtained of the pelvis centered about the prostate. Pre and post contrast images were obtained. CONTRAST:  29m MULTIHANCE GADOBENATE DIMEGLUMINE 529 MG/ML  IV SOLN COMPARISON:  None Available. FINDINGS: Prostate: -- Peripheral Zone: A focal lesion is seen in the left posteromedial apex which shows marked ADC hypointensity and DWI hyperintensity, measuring 12 x 12 mm on image 28/6. PI-RADS 4 -- Transition/Central Zone: Mildly enlarged. No nodules with suspicious characteristics on T2-weighted imaging. -- Measurements/Volume:  5.4 by 4.4 x 4.9 cm (volume = 61 cm^3) Transcapsular spread:  Absent Seminal vesicle involvement:  Absent Neurovascular bundle involvement:  Absent Pelvic adenopathy: None visualized Bone metastasis: None visualized Other: Diffuse bladder wall thickening, consistent with chronic bladder outlet obstruction. Sigmoid diverticulosis, without evidence of diverticulitis. IMPRESSION: 12 mm peripheral zone lesion in the left posteromedial apex, suspicious for high-grade carcinoma. PI-RADS 4 (v2.1): High (clinically significant cancer likely) No evidence of extracapsular extension or pelvic metastatic disease. (I have post-processed this exam in the DynaCAD application for potential fusion-guided biopsy.) Electronically Signed   By: Marlaine Hind [name]   On: 01/08/2022 11:51    Assessment & Plan:   Problem List Items Addressed This Visit     Anxiety disorder    Cont on Lorazepam prn  Potential benefits of a long term benzodiazepines  use as well as potential risks  and complications were explained to the patient and were aknowledged.      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 Doing better Planning RF ablation       Relevant Orders   IgG, IgA, IgM   Comprehensive metabolic panel   CBC with Differential/Platelet   TSH   T4, free   PMR (polymyalgia rheumatica) (HCC)    Doing well      Prostate cancer ([place])     F/u w/Dr Diona Fanti - s/p MRI, bx Under active surveillance  He is going to [facility] later         No orders of the defined types were placed in this encounter.     Follow-up: Return for a follow-up visit.  Walker Kehr, MD

## 2022-02-26 NOTE — Assessment & Plan Note (Signed)
Doing well 

## 2022-02-26 NOTE — Assessment & Plan Note (Addendum)
F/u w/Dr Diona Fanti - s/p MRI, bx Under active surveillance  He is going to [facility] later

## 2022-02-27 ENCOUNTER — Other Ambulatory Visit: Payer: Self-pay | Admitting: Internal Medicine

## 2022-02-28 ENCOUNTER — Ambulatory Visit (HOSPITAL_COMMUNITY): Payer: No Typology Code available for payment source | Admitting: Licensed Clinical Social Worker

## 2022-02-28 LAB — IGG, IGA, IGM
IgG (Immunoglobin G), Serum: 905 mg/dL (ref 600–1540)
IgM, Serum: 20 mg/dL — ABNORMAL LOW (ref 50–300)
Immunoglobulin A: 81 mg/dL (ref 70–320)

## 2022-03-04 ENCOUNTER — Telehealth (HOSPITAL_COMMUNITY): Payer: No Typology Code available for payment source | Admitting: Psychiatry

## 2022-03-05 ENCOUNTER — Ambulatory Visit (HOSPITAL_COMMUNITY): Payer: No Typology Code available for payment source | Admitting: Licensed Clinical Social Worker

## 2022-03-12 ENCOUNTER — Ambulatory Visit (INDEPENDENT_AMBULATORY_CARE_PROVIDER_SITE_OTHER): Payer: No Typology Code available for payment source | Admitting: Licensed Clinical Social Worker

## 2022-03-12 DIAGNOSIS — F319 Bipolar disorder, unspecified: Secondary | ICD-10-CM | POA: Diagnosis not present

## 2022-03-17 ENCOUNTER — Other Ambulatory Visit: Payer: Self-pay | Admitting: Cardiology

## 2022-03-17 DIAGNOSIS — E7800 Pure hypercholesterolemia, unspecified: Secondary | ICD-10-CM

## 2022-03-17 DIAGNOSIS — I2510 Atherosclerotic heart disease of native coronary artery without angina pectoris: Secondary | ICD-10-CM

## 2022-03-18 ENCOUNTER — Ambulatory Visit (INDEPENDENT_AMBULATORY_CARE_PROVIDER_SITE_OTHER): Payer: No Typology Code available for payment source | Admitting: Licensed Clinical Social Worker

## 2022-03-18 ENCOUNTER — Encounter (HOSPITAL_COMMUNITY): Payer: Self-pay | Admitting: Licensed Clinical Social Worker

## 2022-03-18 ENCOUNTER — Telehealth (HOSPITAL_COMMUNITY): Payer: Self-pay | Admitting: *Deleted

## 2022-03-18 ENCOUNTER — Encounter: Payer: Self-pay | Admitting: Dermatology

## 2022-03-18 ENCOUNTER — Other Ambulatory Visit (HOSPITAL_COMMUNITY): Payer: Self-pay | Admitting: Psychiatry

## 2022-03-18 DIAGNOSIS — F411 Generalized anxiety disorder: Secondary | ICD-10-CM

## 2022-03-18 DIAGNOSIS — F319 Bipolar disorder, unspecified: Secondary | ICD-10-CM | POA: Diagnosis not present

## 2022-03-18 MED ORDER — ZOLPIDEM TARTRATE 10 MG PO TABS: ORAL_TABLET | ORAL | 0 refills | Status: DC

## 2022-03-18 NOTE — Progress Notes (Signed)
Virtual virtual Video Note  I connected with Alex [name] on 03/18/2022 at 3:00pm EST by video-enabled virtual visit. I verified that I am speaking with the correct person using  two identifiers.I discussed the limitations of evaluation and management by telemedicine and the availability of in person appointments. The patient expressed understanding and agreed to proceed.    LOCATION: Patient: Home  Provider: Home office   History of Present Illness:  Pt was referred by Dr. Adele Schilder for OP therapy for bipolar disorder and anxiety.  Treatment Goal Addressed:  Pt will meet with clinician 1x every 2 weeks for therapy to monitor for progress towards goals and address any barriers to success; Reduce depression from average severity level of 6/10 down to a 4/10 in next 90 days by engaging in 1-2 positive coping skills fdaily as part of developing self-care routine; Reduce average anxiety level from 7/10 down to 5/10 in next 90 days by utilizing 1-2 relaxation skills/grounding skills per day, such as mindful breathing, progressive muscle relaxation, positive visualizations.  Progress towards Treatment Goal: Progressing   Observations/Objective: Patient presented for today's session on time and was alert, oriented x5, with no evidence or self-report of SI/HI or A/V H.  Patient reported ongoing compliance with medication and denied any use of alcohol or illicit substances.  Clinician inquired about patient's current emotional ratings, as well as any significant changes in thoughts, feelings or behavior since previous session.  Patient reported scores of  6/10 for depression, 6/10 for anxiety, 6/10 for anger/irritability. Pt explored his emotional ratings, which remain high. "I am still grieving the loss of my daughter in an overdose." Again, [name] provided education on the stages of grief. "I'm still in the denial stage. I'm blaming myself." Clinician utilized CBT to process concerns about health, family,  children, worries about diagnosis of prostate cancer challenges. Clinician processed thoughts, feelings, and behaviors. Clinician discussed coping skills and options for supporting himself through these challenging times.   Collaboration of care: Other:   Referral to Hospice for group support for parents of children who have overdosed    Patient/Guardian was advised Release of Information must be obtained prior to any record release in order to collaborate their care with an outside provider. Patient/Guardian was advised if they have not already done so to contact the registration department to sign all necessary forms in order for [place] to release information regarding their care.    Consent: Patient/Guardian gives verbal consent for treatment and assignment of benefits for services provided during this visit. Patient/Guardian expressed understanding and agreed to proceed.     Follow Up Instructions:  I discussed the assessment and treatment plan with the patient. The patient was provided an opportunity to ask questions and all were answered. The patient agreed with the plan and demonstrated an understanding of the instructions.   The patient was advised to call back or seek an in-person evaluation if the symptoms worsen or if the condition fails to improve as anticipated.  I provided 60 minutes of non-face-to-face time during this encounter.   Amri Lien S, LCAS

## 2022-03-18 NOTE — Telephone Encounter (Signed)
Send to [facility] pharmacy.

## 2022-03-18 NOTE — Telephone Encounter (Signed)
Pt called requesting a refill of Ambien 10 mg be sent to [facility]. Previous appointment rescheduled to 2022/04/11 due to sudden death of his daughter. Rx last sen ton 12/06/21 #30 with one refill. Pt states he is flying to up tp [facility] on Thursday and would like refill sent prior to trip. Please review and advise.

## 2022-03-25 ENCOUNTER — Ambulatory Visit (HOSPITAL_COMMUNITY): Payer: No Typology Code available for payment source | Admitting: Licensed Clinical Social Worker

## 2022-04-01 ENCOUNTER — Ambulatory Visit (HOSPITAL_COMMUNITY): Payer: No Typology Code available for payment source | Admitting: Licensed Clinical Social Worker

## 2022-04-02 ENCOUNTER — Telehealth (HOSPITAL_BASED_OUTPATIENT_CLINIC_OR_DEPARTMENT_OTHER): Payer: No Typology Code available for payment source | Admitting: Psychiatry

## 2022-04-02 ENCOUNTER — Encounter (HOSPITAL_COMMUNITY): Payer: Self-pay | Admitting: Psychiatry

## 2022-04-02 VITALS — Wt 176.0 lb

## 2022-04-02 DIAGNOSIS — F4321 Adjustment disorder with depressed mood: Secondary | ICD-10-CM

## 2022-04-02 DIAGNOSIS — F319 Bipolar disorder, unspecified: Secondary | ICD-10-CM

## 2022-04-02 DIAGNOSIS — F411 Generalized anxiety disorder: Secondary | ICD-10-CM | POA: Diagnosis not present

## 2022-04-02 MED ORDER — LORAZEPAM 0.5 MG PO TABS: ORAL_TABLET | ORAL | 2 refills | Status: DC

## 2022-04-02 MED ORDER — ZOLPIDEM TARTRATE 10 MG PO TABS: ORAL_TABLET | ORAL | 1 refills | Status: DC

## 2022-04-02 MED ORDER — LAMOTRIGINE ER 300 MG PO TB24: 1.0000 | ORAL_TABLET | ORAL | 0 refills | Status: DC

## 2022-04-02 NOTE — Progress Notes (Signed)
Virtual Visit via Telephone Note  I connected with Alex [name] on 04/02/22 at  4:00 PM EDT by telephone and verified that I am speaking with the correct person using two identifiers.  Location: Patient: Home Provider: Home Office   I discussed the limitations, risks, security and privacy concerns of performing an evaluation and management service by telephone and the availability of in person appointments. I also discussed with the patient that there may be a patient responsible charge related to this service. The patient expressed understanding and agreed to proceed.   History of Present Illness: Patient is evaluated by phone session.  He is very sad and going through grief.  His 68 year old daughter Alex [name] found dead on March 16, 2023.  Patient suspected it was a drug overdose.  He had not received death certificate.  Patient told cause of death showing pending.  He admitted having nightmares because he remembered seeing her body in the morning.  He admitted having crying spells, racing thoughts but he had a good strong support system.  He worried about his wife who is not willing to do any grief counseling.  Patient told his sister, daughter and another family member came for funeral.  He also seeing Alex [name] for therapy.  In the beginning he has trouble sleeping but slowly and gradually he is trying to get back to his normal life.  He also find out about prostate cancer and now getting treatment at [facility].  Patient told since he does not have complete death certificate of the daughter the insurance company will not going to do anything.  He is also worried about his son who currently in the hospital after ingesting paperclip.  His son has chronic mental illness and he is in and out from the hospital for a long time.  Patient denies any mania, anger, agitation.  He denies any suicidal thoughts but sometimes he feels mentally tired because of his family situation.  He is taking Ativan daily but  sometimes he need to take the second when gets very anxious.  He sleeps okay with the Ambien.  He denies any hallucination, paranoia.  He does not want any medication changes because he feels the medicine is working and he accepted the death of his 68 year old daughter who he believed not doing very well lately and she was very detached, isolated and meeting some new friends and may have caused drug overdose.  Past Psychiatric History: Reviewed. H/O depression, mood swing, anger and Inpatient at [facility] due to suicidal thoughts but no attempt.  No h/o psychosis but h/o poor impulse control.  Tried Klonopin but did not work.  Took Seroquel, Cymbalta and Remeron (increase depression)   Psychiatric Specialty Exam: Physical Exam  Review of Systems  Weight 176 lb (79.8 kg).There is no height or weight on file to calculate BMI.  General Appearance: NA  Eye Contact:  NA  Speech:  Slow  Volume:  Decreased  Mood:  Dysphoric  Affect:  NA  Thought Process:  Goal Directed  Orientation:  Full (Time, Place, and Person)  Thought Content:  Rumination  Suicidal Thoughts:  No  Homicidal Thoughts:  No  Memory:  Immediate;   Good Recent;   Fair Remote;   Fair  Judgement:  Intact  Insight:  Present  Psychomotor Activity:  NA  Concentration:  Concentration: Fair and Attention Span: Fair  Recall:  Good  Fund of Knowledge:  Good  Language:  Good  Akathisia:  No  Handed:  Right  AIMS (if indicated):     Assets:  Communication Skills Desire for Improvement Housing Transportation  ADL's:  Intact  Cognition:  WNL  Sleep:   fair      Assessment and Plan: Bipolar disorder type I.  Generalized anxiety disorder.  Grief.  Reassurance given.  Provided resources about grief.  He does not want to change the medication.  He reported good support from his sister, and other family members.  He like to keep the appointment with Alex [name].  Continue Ambien 5 to 10 mg for insomnia.  He like to have this  medication filled from the pharmacy.  Continue Ativan 0.5 mg 1 to 2 tablet as needed for anxiety and lamotrigine 300 mg to help his mood symptoms.  He has no rash, itching, tremors or shakes.  Recommended to call us back if there is any question or any concern or if he need an earlier appointment.  Follow-up in 3 months.  Follow Up Instructions:    I discussed the assessment and treatment plan with the patient. The patient was provided an opportunity to ask questions and all were answered. The patient agreed with the plan and demonstrated an understanding of the instructions.   The patient was advised to call back or seek an in-person evaluation if the symptoms worsen or if the condition fails to improve as anticipated.  Collaboration of Care: Other provider involved in patient's care AEB notes are available in epic to review.  Patient/Guardian was advised Release of Information must be obtained prior to any record release in order to collaborate their care with an outside provider. Patient/Guardian was advised if they have not already done so to contact the registration department to sign all necessary forms in order for [place] to release information regarding their care.   Consent: Patient/Guardian gives verbal consent for treatment and assignment of benefits for services provided during this visit. Patient/Guardian expressed understanding and agreed to proceed.    I provided 26 minutes of non-face-to-face time during this encounter.   Kathlee Nations, MD

## 2022-04-03 ENCOUNTER — Ambulatory Visit (INDEPENDENT_AMBULATORY_CARE_PROVIDER_SITE_OTHER): Payer: No Typology Code available for payment source | Admitting: Licensed Clinical Social Worker

## 2022-04-03 ENCOUNTER — Encounter (HOSPITAL_COMMUNITY): Payer: Self-pay | Admitting: Licensed Clinical Social Worker

## 2022-04-03 DIAGNOSIS — F319 Bipolar disorder, unspecified: Secondary | ICD-10-CM | POA: Diagnosis not present

## 2022-04-03 DIAGNOSIS — F411 Generalized anxiety disorder: Secondary | ICD-10-CM

## 2022-04-03 NOTE — Progress Notes (Signed)
Virtual virtual Video Note  I connected with Alex [name] on 03/18/2022 at 3:00pm EST by video-enabled virtual visit. I verified that I am speaking with the correct person using  two identifiers.I discussed the limitations of evaluation and management by telemedicine and the availability of in person appointments. The patient expressed understanding and agreed to proceed.    LOCATION: Patient: Home  Provider: Home office   History of Present Illness:  Pt was referred by Dr. Adele [name] for OP therapy for bipolar disorder and anxiety.  Treatment Goal Addressed:  Pt will meet with clinician 1x every 2 weeks for therapy to monitor for progress towards goals and address any barriers to success; Reduce depression from average severity level of 6/10 down to a 4/10 in next 90 days by engaging in 1-2 positive coping skills fdaily as part of developing self-care routine; Reduce average anxiety level from 7/10 down to 5/10 in next 90 days by utilizing 1-2 relaxation skills/grounding skills per day, such as mindful breathing, progressive muscle relaxation, positive visualizations.  Progress towards Treatment Goal: Progressing   Observations/Objective: Patient presented for today's session on time and was alert, oriented x5, with no evidence or self-report of SI/HI or A/V H.  Patient reported ongoing compliance with medication and denied any use of alcohol or illicit substances.  Clinician inquired about patient's current emotional ratings, as well as any significant changes in thoughts, feelings or behavior since previous session.  Patient reported scores of  6/10 for depression, 6/10 for anxiety, 6/10 for anger/irritability. Pt explored his emotional ratings, which remain high. "I am still grieving the loss of my daughter in an overdose." Again, [name] provided education on the stages of grief. "I'm now in the stage of bargaining in the stages of grief. [name] and pt reviewed the different stages of grief. " I want to  go to grief counseling but my wife doesn't want to go."[name] provided information on the benefits of grief counseling.  Clinician utilized CBT to process concerns about health, family, children, worries about diagnosis of prostate cancer challenges. Clinician processed thoughts, feelings, and behaviors. Clinician discussed coping skills and options for supporting himself through these challenging times.   Collaboration of care: Other:   Referral to Hospice for group support for parents of children who have overdosed    Patient/Guardian was advised Release of Information must be obtained prior to any record release in order to collaborate their care with an outside provider. Patient/Guardian was advised if they have not already done so to contact the registration department to sign all necessary forms in order for [place] to release information regarding their care.    Consent: Patient/Guardian gives verbal consent for treatment and assignment of benefits for services provided during this visit. Patient/Guardian expressed understanding and agreed to proceed.     Follow Up Instructions:  I discussed the assessment and treatment plan with the patient. The patient was provided an opportunity to ask questions and all were answered. The patient agreed with the plan and demonstrated an understanding of the instructions.   The patient was advised to call back or seek an in-person evaluation if the symptoms worsen or if the condition fails to improve as anticipated.  I provided 60 minutes of non-face-to-face time during this encounter.   Alex [name] S, LCAS

## 2022-04-04 ENCOUNTER — Inpatient Hospital Stay: Admission: RE | Admit: 2022-04-04 | Payer: No Typology Code available for payment source | Source: Ambulatory Visit

## 2022-04-04 ENCOUNTER — Other Ambulatory Visit: Payer: Self-pay

## 2022-04-04 ENCOUNTER — Encounter (HOSPITAL_COMMUNITY): Payer: Self-pay | Admitting: Licensed Clinical Social Worker

## 2022-04-04 DIAGNOSIS — Z01818 Encounter for other preprocedural examination: Secondary | ICD-10-CM

## 2022-04-04 DIAGNOSIS — M25522 Pain in left elbow: Secondary | ICD-10-CM | POA: Insufficient documentation

## 2022-04-04 NOTE — Progress Notes (Signed)
Virtual virtual Video Note  I connected with Alex [name] on 03/12/2022 at 3:00pm EST by video-enabled virtual visit. I verified that I am speaking with the correct person using  two identifiers.I discussed the limitations of evaluation and management by telemedicine and the availability of in person appointments. The patient expressed understanding and agreed to proceed.    LOCATION: Patient: Home  Provider: Home office   History of Present Illness:  Pt was referred by Dr. Adele Schilder for OP therapy for bipolar disorder and anxiety.  Treatment Goal Addressed:  Pt will meet with clinician 1x every 2 weeks for therapy to monitor for progress towards goals and address any barriers to success; Reduce depression from average severity level of 6/10 down to a 4/10 in next 90 days by engaging in 1-2 positive coping skills fdaily as part of developing self-care routine; Reduce average anxiety level from 7/10 down to 5/10 in next 90 days by utilizing 1-2 relaxation skills/grounding skills per day, such as mindful breathing, progressive muscle relaxation, positive visualizations.  Progress towards Treatment Goal: Progressing   Observations/Objective: Patient presented for today's session on time and was alert, oriented x5, with no evidence or self-report of SI/HI or A/V H.  Patient reported ongoing compliance with medication and denied any use of alcohol or illicit substances.  Clinician inquired about patient's current emotional ratings, as well as any significant changes in thoughts, feelings or behavior since previous session.  Patient reported scores of  8/10 for depression, 8/10 for anxiety, 8/10 for anger/irritability. Pt explored his emotional ratings, which remain high."My daughter Vida Roller died in her sleep by drug overdose. I found her. I will never unsee it." [name] asked open ended questions. [name] allowed the patient to tearfully express his grief, sadness, anger, frustration throughout the session. [name]  asked about service for his daughter, so he was able to discuss the funeral arrangements, which he is arranging with family from [place] coming in.         Collaboration of care: Other:  Referral to Hospice for grief counseling    Patient/Guardian was advised Release of Information must be obtained prior to any record release in order to collaborate their care with an outside provider. Patient/Guardian was advised if they have not already done so to contact the registration department to sign all necessary forms in order for [place] to release information regarding their care.    Consent: Patient/Guardian gives verbal consent for treatment and assignment of benefits for services provided during this visit. Patient/Guardian expressed understanding and agreed to proceed.     Follow Up Instructions:  I discussed the assessment and treatment plan with the patient. The patient was provided an opportunity to ask questions and all were answered. The patient agreed with the plan and demonstrated an understanding of the instructions.   The patient was advised to call back or seek an in-person evaluation if the symptoms worsen or if the condition fails to improve as anticipated.  I provided 60 minutes of non-face-to-face time during this encounter.   Brooks Stotz S, LCAS

## 2022-04-08 ENCOUNTER — Ambulatory Visit (HOSPITAL_COMMUNITY): Payer: No Typology Code available for payment source | Admitting: Licensed Clinical Social Worker

## 2022-04-09 ENCOUNTER — Ambulatory Visit (INDEPENDENT_AMBULATORY_CARE_PROVIDER_SITE_OTHER): Payer: No Typology Code available for payment source | Admitting: Licensed Clinical Social Worker

## 2022-04-09 DIAGNOSIS — F411 Generalized anxiety disorder: Secondary | ICD-10-CM

## 2022-04-09 DIAGNOSIS — F319 Bipolar disorder, unspecified: Secondary | ICD-10-CM | POA: Diagnosis not present

## 2022-04-13 ENCOUNTER — Encounter (HOSPITAL_COMMUNITY): Payer: Self-pay | Admitting: Licensed Clinical Social Worker

## 2022-04-13 NOTE — Progress Notes (Signed)
Virtual virtual Video Note  I connected with Alex [name] on 01/01/2022 at 3:00pm EST by video-enabled virtual visit. I verified that I am speaking with the correct person using  two identifiers.I discussed the limitations of evaluation and management by telemedicine and the availability of in person appointments. The patient expressed understanding and agreed to proceed.    LOCATION: Patient: Home  Provider: Home office   History of Present Illness:  Pt was referred by Dr. Adele Schilder for OP therapy for bipolar disorder and anxiety.  Treatment Goal Addressed:  Pt will meet with clinician 1x every 2 weeks for therapy to monitor for progress towards goals and address any barriers to success; Reduce depression from average severity level of 6/10 down to a 4/10 in next 90 days by engaging in 1-2 positive coping skills fdaily as part of developing self-care routine; Reduce average anxiety level from 7/10 down to 5/10 in next 90 days by utilizing 1-2 relaxation skills/grounding skills per day, such as mindful breathing, progressive muscle relaxation, positive visualizations.  Progress towards Treatment Goal: Progressing   Observations/Objective: Patient presented for today's session on time and was alert, oriented x5, with no evidence or self-report of SI/HI or A/V H.  Patient reported ongoing compliance with medication and denied any use of alcohol or illicit substances.  Clinician inquired about patient's current emotional ratings, as well as any significant changes in thoughts, feelings or behavior since previous session.  Patient reported scores of  6/10 for depression, 6/10 for anxiety, 6/10 for anger/irritability. Pt explored his emotional ratings, which remain high. Pt provided an update on his children, health issues and family. "My moods are still mixed." [name] asked open ended questions. [name] and pt explored alternatives to help resolve family issues: family meetings, required rent/phone/insurance  for adult children, boundaries. [name] utilized DBT Regulation to assist patient in communicating with his adult children who live with him, without becoming angry and raising his voice. [name] and pt role played in session.       Collaboration of care: Continue medication management with Dr. Adele Schilder    Patient/Guardian was advised Release of Information must be obtained prior to any record release in order to collaborate their care with an outside provider. Patient/Guardian was advised if they have not already done so to contact the registration department to sign all necessary forms in order for [place] to release information regarding their care.    Consent: Patient/Guardian gives verbal consent for treatment and assignment of benefits for services provided during this visit. Patient/Guardian expressed understanding and agreed to proceed.     Follow Up Instructions:  I discussed the assessment and treatment plan with the patient. The patient was provided an opportunity to ask questions and all were answered. The patient agreed with the plan and demonstrated an understanding of the instructions.   The patient was advised to call back or seek an in-person evaluation if the symptoms worsen or if the condition fails to improve as anticipated.  I provided 45 minutes of non-face-to-face time during this encounter.   Alex [name] S, LCAS

## 2022-04-13 NOTE — Progress Notes (Signed)
Virtual virtual Video Note  I connected with Alex [name] on 12/19/2021 at 2:00 pm EST by video-enabled virtual visit. I verified that I am speaking with the correct person using  two identifiers.I discussed the limitations of evaluation and management by telemedicine and the availability of in person appointments. The patient expressed understanding and agreed to proceed.    LOCATION: Patient: Car/Home driveway Provider: Home office   History of Present Illness:  Pt was referred by Dr. Adele Schilder for OP therapy for bipolar disorder and anxiety.  Treatment Goal Addressed:  Pt will meet with clinician weekly for therapy to monitor for progress towards goals and address any barriers to success; Reduce depression from average severity level of 6/10 down to a 4/10 in next 90 days by engaging in 1-2 positive coping skills fdaily as part of developing self-care routine; Reduce average anxiety level from 7/10 down to 5/10 in next 90 days by utilizing 1-2 relaxation skills/grounding skills per day, such as mindful breathing, progressive muscle relaxation, positive visualizations.  Progress towards Treatment Goal: Progressing   Observations/Objective: Patient presented for today's session on time and was alert, oriented x5, with no evidence or self-report of SI/HI or A/V H.  Patient reported ongoing compliance with medication and denied any use of alcohol or illicit substances.  Clinician inquired about patient's current emotional ratings, as well as any significant changes in thoughts, feelings or behavior since previous session.  Patient reported scores of  8/10 for depression, 8/10 for anxiety, 6/10 for anger/irritability. Pt explored his emotional ratings, which remain high. Pt provided an update on his children, health issues and family. My moods continue to be mixed due to my many stressors. [name] used CBT  to assist patient identifying his positives to assess how mood has been impacted. [name] allowed space  for him to further process emotions and clinician provided supportive statements throughout session. It was suggested to patient to continue to identify positives that  impact his mood daily. Cl  taught patient how to recognize the physical, cognitive, emotional, and behavioral responses they may lead to anger-provoking situations.  Pt identified a recent time he became angry and how he reacted.  [name] and pt analyzed his reaction was possibly beneficial and how it was possibly unhelpful.  [name] provided education on a variety of healthier coping skills that could help with such a situation in the future.  [name] provided education on  anger ibeing a secondary emotion fueled by other feelings .      Collaboration of care: Continue medication management with Dr. Adele Schilder    Patient/Guardian was advised Release of Information must be obtained prior to any record release in order to collaborate their care with an outside provider. Patient/Guardian was advised if they have not already done so to contact the registration department to sign all necessary forms in order for [place] to release information regarding their care.    Consent: Patient/Guardian gives verbal consent for treatment and assignment of benefits for services provided during this visit. Patient/Guardian expressed understanding and agreed to proceed.     Follow Up Instructions:  I discussed the assessment and treatment plan with the patient. The patient was provided an opportunity to ask questions and all were answered. The patient agreed with the plan and demonstrated an understanding of the instructions.   The patient was advised to call back or seek an in-person evaluation if the symptoms worsen or if the condition fails to improve as anticipated.  I provided 60 minutes of  non-face-to-face time during this encounter.   Kamayah Pillay S, LCAS

## 2022-04-17 ENCOUNTER — Encounter (HOSPITAL_COMMUNITY): Payer: Self-pay | Admitting: Licensed Clinical Social Worker

## 2022-04-17 ENCOUNTER — Ambulatory Visit (INDEPENDENT_AMBULATORY_CARE_PROVIDER_SITE_OTHER): Payer: No Typology Code available for payment source | Admitting: Licensed Clinical Social Worker

## 2022-04-17 DIAGNOSIS — F319 Bipolar disorder, unspecified: Secondary | ICD-10-CM

## 2022-04-17 DIAGNOSIS — F411 Generalized anxiety disorder: Secondary | ICD-10-CM | POA: Diagnosis not present

## 2022-04-17 NOTE — Progress Notes (Signed)
Virtual virtual Video Note  I connected with Alex [name] on 04/17/2022 at 3:00pm EST by video-enabled virtual visit. I verified that I am speaking with the correct person using  two identifiers.I discussed the limitations of evaluation and management by telemedicine and the availability of in person appointments. The patient expressed understanding and agreed to proceed.    LOCATION: Patient: Home  Provider: Home office   History of Present Illness:  Pt was referred by Dr. Adele Schilder for OP therapy for bipolar disorder and anxiety.  Treatment Goal Addressed:  Pt will meet with clinician 1x every 2 weeks for therapy to monitor for progress towards goals and address any barriers to success; Reduce depression from average severity level of 6/10 down to a 4/10 in next 90 days by engaging in 1-2 positive coping skills fdaily as part of developing self-care routine; Reduce average anxiety level from 7/10 down to 5/10 in next 90 days by utilizing 1-2 relaxation skills/grounding skills per day, such as mindful breathing, progressive muscle relaxation, positive visualizations.  Progress towards Treatment Goal: Progressing   Observations/Objective: Patient presented for today's session on time and was alert, oriented x5, with no evidence or self-report of SI/HI or A/V H.  Patient reported ongoing compliance with medication and denied any use of alcohol or illicit substances.  Clinician inquired about patient's current emotional ratings, as well as any significant changes in thoughts, feelings or behavior since previous session.  Patient reported scores of  6/10 for depression, 6/10 for anxiety, 6/10 for anger/irritability. Pt explored his emotional ratings, which remain high. "I am still grieving the loss of my daughter in an overdose." Again, [name] provided education on the stages of grief. "I'm now in the stage of depression in the stages of grief. [name] and pt reviewed the different stages of grief. " [name] has  suggested pt go to grief counseling even if his  wife doesn't want to go.[name] provided information on the benefits of grief counseling.  Clinician utilized CBT to process concerns about health, death details of daughter, family, grief, children, worries about diagnosis of prostate cancer challenges. Clinician processed thoughts, feelings, and behaviors. Clinician discussed coping skills and options for supporting himself through these challenging times.   Collaboration of care: Other:   Referral to Hospice for group support for parents of children who have overdosed    Patient/Guardian was advised Release of Information must be obtained prior to any record release in order to collaborate their care with an outside provider. Patient/Guardian was advised if they have not already done so to contact the registration department to sign all necessary forms in order for [place] to release information regarding their care.    Consent: Patient/Guardian gives verbal consent for treatment and assignment of benefits for services provided during this visit. Patient/Guardian expressed understanding and agreed to proceed.     Follow Up Instructions:  I discussed the assessment and treatment plan with the patient. The patient was provided an opportunity to ask questions and all were answered. The patient agreed with the plan and demonstrated an understanding of the instructions.   The patient was advised to call back or seek an in-person evaluation if the symptoms worsen or if the condition fails to improve as anticipated.  I provided 60 minutes of non-face-to-face time during this encounter.   Travaris Kosh S, LCAS

## 2022-04-23 LAB — BASIC METABOLIC PANEL
BUN/Creatinine Ratio: 17 (ref 10–24)
BUN: 18 mg/dL (ref 8–27)
CO2: 22 mmol/L (ref 20–29)
Calcium: 8.6 mg/dL (ref 8.6–10.2)
Chloride: 103 mmol/L (ref 96–106)
Creatinine, Ser: 1.09 mg/dL (ref 0.76–1.27)
Glucose: 114 mg/dL — ABNORMAL HIGH (ref 70–99)
Potassium: 4.5 mmol/L (ref 3.5–5.2)
Sodium: 142 mmol/L (ref 134–144)
eGFR: 74 mL/min/{1.73_m2} (ref >59)

## 2022-04-24 ENCOUNTER — Ambulatory Visit (INDEPENDENT_AMBULATORY_CARE_PROVIDER_SITE_OTHER): Payer: No Typology Code available for payment source | Admitting: Licensed Clinical Social Worker

## 2022-04-24 ENCOUNTER — Encounter (HOSPITAL_COMMUNITY): Payer: Self-pay | Admitting: Licensed Clinical Social Worker

## 2022-04-24 DIAGNOSIS — F319 Bipolar disorder, unspecified: Secondary | ICD-10-CM

## 2022-04-24 NOTE — Progress Notes (Signed)
Virtual virtual Video Note  I connected with Alex [name] on 04/24/2022 at 3:00pm EST by video-enabled virtual visit. I verified that I am speaking with the correct person using  two identifiers.I discussed the limitations of evaluation and management by telemedicine and the availability of in person appointments. The patient expressed understanding and agreed to proceed.    LOCATION: Patient: Home  Provider: Home office   History of Present Illness:  Pt was referred by Dr. Adele Schilder for OP therapy for bipolar disorder and anxiety.  Treatment Goal Addressed:  Pt will meet with clinician 1x every 2 weeks for therapy to monitor for progress towards goals and address any barriers to success; Reduce depression from average severity level of 6/10 down to a 4/10 in next 90 days by engaging in 1-2 positive coping skills fdaily as part of developing self-care routine; Reduce average anxiety level from 7/10 down to 5/10 in next 90 days by utilizing 1-2 relaxation skills/grounding skills per day, such as mindful breathing, progressive muscle relaxation, positive visualizations.  Progress towards Treatment Goal: Progressing   Observations/Objective: Patient presented for today's session on time and was alert, oriented x5, with no evidence or self-report of SI/HI or A/V H.  Patient reported ongoing compliance with medication and denied any use of alcohol or illicit substances.  Clinician inquired about patient's current emotional ratings, as well as any significant changes in thoughts, feelings or behavior since previous session.  Patient reported scores of  6/10 for depression, 6/10 for anxiety, 6/10 for anger/irritability. Pt explored his emotional ratings, which remain high. "I am still grieving the loss of my daughter in an overdose." Again, [name] provided education on the stages of grief. "I'm now in the stage of depression in the stages of grief. [name] and pt reviewed the different stages of grief.  The family  is getting together to throw her ashes into the ocean." Again, [name] has suggested pt go to grief counseling even if his  wife doesn't want to go.[name] provided information on the benefits of grief counseling at Hospice. Clinician utilized CBT to process concerns about health, death details of daughter, family, grief, children, worries about diagnosis of prostate cancer challenges. He has upcoming appts at [facility] to determine next steps. He will have a Social worker at [employer] provided by [employer] center.Clinician processed thoughts, feelings, and behaviors. Clinician discussed coping skills and options for supporting himself through these challenging times.   Collaboration of care: Other:   Referral to Hospice for group support for parents of children who have overdosed    Patient/Guardian was advised Release of Information must be obtained prior to any record release in order to collaborate their care with an outside provider. Patient/Guardian was advised if they have not already done so to contact the registration department to sign all necessary forms in order for [place] to release information regarding their care.    Consent: Patient/Guardian gives verbal consent for treatment and assignment of benefits for services provided during this visit. Patient/Guardian expressed understanding and agreed to proceed.     Follow Up Instructions:  I discussed the assessment and treatment plan with the patient. The patient was provided an opportunity to ask questions and all were answered. The patient agreed with the plan and demonstrated an understanding of the instructions.   The patient was advised to call back or seek an in-person evaluation if the symptoms worsen or if the condition fails to improve as anticipated.  I provided 60 minutes of non-face-to-face time during  this encounter.   Ottilie Wigglesworth S, LCAS

## 2022-04-29 ENCOUNTER — Ambulatory Visit
Admission: RE | Admit: 2022-04-29 | Discharge: 2022-04-29 | Disposition: A | Discharge status: Home / Self Care | Payer: No Typology Code available for payment source | Source: Ambulatory Visit | Attending: Nurse Practitioner | Admitting: Nurse Practitioner

## 2022-04-29 DIAGNOSIS — I7121 Aneurysm of the ascending aorta, without rupture: Secondary | ICD-10-CM

## 2022-04-29 MED ORDER — IOPAMIDOL (ISOVUE-370) INJECTION 76%
75.0000 mL | Freq: Once | INTRAVENOUS | Status: AC | PRN
  Administered 2022-04-29: 75 mL via INTRAVENOUS

## 2022-05-01 ENCOUNTER — Ambulatory Visit (INDEPENDENT_AMBULATORY_CARE_PROVIDER_SITE_OTHER): Payer: No Typology Code available for payment source | Admitting: Licensed Clinical Social Worker

## 2022-05-01 ENCOUNTER — Encounter (HOSPITAL_COMMUNITY): Payer: Self-pay | Admitting: Licensed Clinical Social Worker

## 2022-05-01 DIAGNOSIS — F319 Bipolar disorder, unspecified: Secondary | ICD-10-CM

## 2022-05-01 NOTE — Progress Notes (Signed)
Virtual virtual Video Note  I connected with Alex [name] on 05/01/2022 at 3:00pm EST by video-enabled virtual visit. I verified that I am speaking with the correct person using  two identifiers.I discussed the limitations of evaluation and management by telemedicine and the availability of in person appointments. The patient expressed understanding and agreed to proceed.    LOCATION: Patient: Home  Provider: Home office   History of Present Illness:  Pt was referred by Dr. Adele Schilder for OP therapy for bipolar disorder and anxiety.  Treatment Goal Addressed:  Pt will meet with clinician 1x every 2 weeks for therapy to monitor for progress towards goals and address any barriers to success; Reduce depression from average severity level of 6/10 down to a 4/10 in next 90 days by engaging in 1-2 positive coping skills fdaily as part of developing self-care routine; Reduce average anxiety level from 7/10 down to 5/10 in next 90 days by utilizing 1-2 relaxation skills/grounding skills per day, such as mindful breathing, progressive muscle relaxation, positive visualizations.  Progress towards Treatment Goal: Progressing   Observations/Objective: Patient presented for today's session on time and was alert, oriented x5, with no evidence or self-report of SI/HI or A/V H.  Patient reported ongoing compliance with medication and denied any use of alcohol or illicit substances.  Clinician inquired about patient's current emotional ratings, as well as any significant changes in thoughts, feelings or behavior since previous session.  Patient reported scores of  7/10 for depression, 7/10 for anxiety, 6/10 for anger/irritability. Pt explored his emotional ratings, which remain high. "I am still grieving the loss of my daughter in an overdose." "Ive been reading about the stages of grief, I feel like I'm experiencing complicated grief. [name] asked open ended questions. "I'm also still in the stage of depression in the  stages of grief. [name] and pt reviewed the different stages of grief.  The family is getting together next week to throw her ashes into the ocean." Again, [name] has suggested pt go to grief counseling even if his  wife doesn't want to go.[name] provided information on the benefits of grief counseling at Hospice. Clinician utilized CBT to process concerns about health, death details of daughter, family, grief, children, worries about diagnosis of prostate cancer challenges. He met with drs. at [facility] and  determined next steps. He will have a Social worker at [employer] provided by [employer] center.Clinician processed thoughts, feelings, and behaviors. Clinician discussed coping skills and options for supporting himself through these challenging times.   Collaboration of care: Other:   Referral to Hospice for group support for parents of children who have overdosed    Patient/Guardian was advised Release of Information must be obtained prior to any record release in order to collaborate their care with an outside provider. Patient/Guardian was advised if they have not already done so to contact the registration department to sign all necessary forms in order for [place] to release information regarding their care.    Consent: Patient/Guardian gives verbal consent for treatment and assignment of benefits for services provided during this visit. Patient/Guardian expressed understanding and agreed to proceed.     Follow Up Instructions:  I discussed the assessment and treatment plan with the patient. The patient was provided an opportunity to ask questions and all were answered. The patient agreed with the plan and demonstrated an understanding of the instructions.   The patient was advised to call back or seek an in-person evaluation if the symptoms worsen or if the  condition fails to improve as anticipated.  I provided 60 minutes of non-face-to-face time during this encounter.   Zenobia Kuennen S, LCAS

## 2022-05-05 ENCOUNTER — Encounter (HOSPITAL_COMMUNITY): Payer: Self-pay | Admitting: Licensed Clinical Social Worker

## 2022-05-05 NOTE — Progress Notes (Signed)
Virtual virtual Video Note  I connected with Alex [name] on 04/09/2022 at 2:00pm EST by video-enabled virtual visit. I verified that I am speaking with the correct person using  two identifiers.I discussed the limitations of evaluation and management by telemedicine and the availability of in person appointments. The patient expressed understanding and agreed to proceed.    LOCATION: Patient: Home  Provider: Home office   History of Present Illness:  Pt was referred by Dr. Adele Schilder for OP therapy for bipolar disorder and anxiety.  Treatment Goal Addressed:  Pt will meet with clinician weekly for therapy to monitor for progress towards goals and address any barriers to success; Reduce depression from average severity level of 6/10 down to a 4/10 in next 90 days by engaging in 1-2 positive coping skills fdaily as part of developing self-care routine; Reduce average anxiety level from 7/10 down to 5/10 in next 90 days by utilizing 1-2 relaxation skills/grounding skills per day, such as mindful breathing, progressive muscle relaxation, positive visualizations.  Progress towards Treatment Goal: Progressing   Observations/Objective: Patient presented for today's session on time and was alert, oriented x5, with no evidence or self-report of SI/HI or A/V H.  Patient reported ongoing compliance with medication and denied any use of alcohol or illicit substances.  Clinician inquired about patient's current emotional ratings, as well as any significant changes in thoughts, feelings or behavior since previous session.  Patient reported scores of  6/10 for depression, 6/10 for anxiety, 6/10 for anger/irritability. Pt explored his emotional ratings, which remain high. "I am still grieving the loss of my daughter in an overdose." [name] and pt reviewed the stages of grief, where patient identified what stage of grief he was in. " I want to go to grief counseling but my wife doesn't want to go."[name] provided  information on the benefits of grief counseling and group of parents who have lost their child to drug overdose.  Clinician utilized CBT to process concerns about health, family, children, worries about diagnosis of prostate cancer challenges. Clinician processed thoughts, feelings, and behaviors. Clinician discussed coping skills and options for supporting himself. [name] suggested pt to go online to the [facility]osp[facility] website to see what is available for grief counseling.   Collaboration of care: Other:   Referral to Hospice for group support for parents of children who have overdosed    Patient/Guardian was advised Release of Information must be obtained prior to any record release in order to collaborate their care with an outside provider. Patient/Guardian was advised if they have not already done so to contact the registration department to sign all necessary forms in order for [place] to release information regarding their care.    Consent: Patient/Guardian gives verbal consent for treatment and assignment of benefits for services provided during this visit. Patient/Guardian expressed understanding and agreed to proceed.     Follow Up Instructions:  I discussed the assessment and treatment plan with the patient. The patient was provided an opportunity to ask questions and all were answered. The patient agreed with the plan and demonstrated an understanding of the instructions.   The patient was advised to call back or seek an in-person evaluation if the symptoms worsen or if the condition fails to improve as anticipated.  I provided 60 minutes of non-face-to-face time during this encounter.   Bob Daversa S, LCAS

## 2022-05-14 ENCOUNTER — Other Ambulatory Visit: Payer: Self-pay | Admitting: Internal Medicine

## 2022-05-14 ENCOUNTER — Ambulatory Visit (INDEPENDENT_AMBULATORY_CARE_PROVIDER_SITE_OTHER): Payer: No Typology Code available for payment source | Admitting: Internal Medicine

## 2022-05-14 ENCOUNTER — Ambulatory Visit: Payer: No Typology Code available for payment source | Admitting: Dermatology

## 2022-05-14 ENCOUNTER — Encounter: Payer: Self-pay | Admitting: Internal Medicine

## 2022-05-14 DIAGNOSIS — J029 Acute pharyngitis, unspecified: Secondary | ICD-10-CM

## 2022-05-14 MED ORDER — AZITHROMYCIN 250 MG PO TABS: ORAL_TABLET | ORAL | 0 refills | Status: AC

## 2022-05-14 NOTE — Patient Instructions (Signed)
We will send in azithromycin to take 2 pills on Day 1. Then days 2-5 take 1 pill daily until gone.  Let us know if the bleeding comes back or does not go away.

## 2022-05-14 NOTE — Progress Notes (Unsigned)
   Subjective:   Patient ID: Alex [name], male    DOB: [birth year], 66 y.o.   MRN: [id number]  HPI The patient is a 66 YO man coming in for blood on pillow this morning. Sinus symptoms for 2 weeks was around grandchildren at beach. Hx neck cancer  Review of Systems  Constitutional:  Negative for activity change, appetite change, chills, fatigue, fever and unexpected weight change.  HENT:  Positive for congestion, postnasal drip, rhinorrhea, sinus pressure and sore throat. Negative for ear discharge, ear pain, sinus pain, sneezing, tinnitus, trouble swallowing and voice change.   Eyes: Negative.   Respiratory:  Positive for cough. Negative for chest tightness, shortness of breath and wheezing.   Cardiovascular: Negative.   Gastrointestinal: Negative.   Musculoskeletal:  Negative for myalgias.  Neurological: Negative.     Objective:  Physical Exam Constitutional:      Appearance: He is well-developed.  HENT:     Head: Normocephalic and atraumatic.     Comments: Oropharynx with redness and clear drainage, nose with swollen turbinates, TMs normal bilaterally.  Neck:     Thyroid: No thyromegaly.  Cardiovascular:     Rate and Rhythm: Normal rate and regular rhythm.  Pulmonary:     Effort: Pulmonary effort is normal. No respiratory distress.     Breath sounds: Normal breath sounds. No wheezing or rales.  Abdominal:     General: Bowel sounds are normal. There is no distension.     Palpations: Abdomen is soft.     Tenderness: There is no abdominal tenderness. There is no rebound.  Musculoskeletal:        General: No tenderness.     Cervical back: Normal range of motion.  Lymphadenopathy:     Cervical: No cervical adenopathy.  Skin:    General: Skin is warm and dry.  Neurological:     Mental Status: He is alert and oriented to person, place, and time.     Coordination: Coordination normal.     Vitals:   05/14/22 0948  BP: (!) 132/90  Pulse: 89  Temp: 98.6 F (37 C)   SpO2: 98%  Weight: 178 lb (80.7 kg)  Height: 6' (1.829 m)    Assessment & Plan:

## 2022-05-14 NOTE — Assessment & Plan Note (Signed)
Going on for 2-3 weeks with some blood on pillow in the morning. Rx azithromycin for likely bacterial sinusitis and immune deficiency.

## 2022-05-15 ENCOUNTER — Ambulatory Visit (INDEPENDENT_AMBULATORY_CARE_PROVIDER_SITE_OTHER): Payer: No Typology Code available for payment source | Admitting: Licensed Clinical Social Worker

## 2022-05-15 DIAGNOSIS — F319 Bipolar disorder, unspecified: Secondary | ICD-10-CM

## 2022-05-16 ENCOUNTER — Ambulatory Visit: Payer: No Typology Code available for payment source

## 2022-05-19 ENCOUNTER — Encounter (HOSPITAL_COMMUNITY): Payer: Self-pay | Admitting: Licensed Clinical Social Worker

## 2022-05-19 NOTE — Progress Notes (Signed)
Virtual virtual Video Note  I connected with Alex [name] on 05/19/2022 at 3:00pm EST by video-enabled virtual visit. I verified that I am speaking with the correct person using  two identifiers.I discussed the limitations of evaluation and management by telemedicine and the availability of in person appointments. The patient expressed understanding and agreed to proceed.    LOCATION: Patient: Home  Provider: Home office   History of Present Illness:  Pt was referred by Dr. Arfeen for OP therapy for bipolar disorder and anxiety.  Treatment Goal Addressed:  Pt will meet with clinician 1x every 2 weeks for therapy to monitor for progress towards goals and address any barriers to success; Reduce depression from average severity level of 6/10 down to a 4/10 in next 90 days by engaging in 1-2 positive coping skills fdaily as part of developing self-care routine; Reduce average anxiety level from 7/10 down to 5/10 in next 90 days by utilizing 1-2 relaxation skills/grounding skills per day, such as mindful breathing, progressive muscle relaxation, positive visualizations.  Progress towards Treatment Goal: Progressing   Observations/Objective: Patient presented for today's session on time and was alert, oriented x5, with no evidence or self-report of SI/HI or A/V H.  Patient reported ongoing compliance with medication and denied any use of alcohol or illicit substances.  Clinician inquired about patient's current emotional ratings, as well as any significant changes in thoughts, feelings or behavior since previous session.  Patient reported scores of  7/10 for depression, 7/10 for anxiety, 6/10 for anger/irritability. Pt explored his emotional ratings, which remain high. "I am still grieving the loss of my daughter in an overdose." "Ive been reading about the stages of grief, I still like I'm experiencing complicated grief. [name] asked open ended questions. "I'm also still in the stage of depression in the  stages of grief. The family got together to threw her ashes into the ocean. We had a good family time together." [name] reported on his counseling at [facility] and it went well and I'm going to see him 2 more times.  Again, [name] and pt discussed him going to grief counseling.at Hospice. [name] provided information on the benefits of grief counseling at Hospice. Clinician utilized CBT to process concerns about health, death details of daughter, family, grief, children, worries about diagnosis of prostate cancer challenges. He met with drs. at [facility] and  determined next steps. Clinician processed thoughts, feelings, and behaviors. Clinician discussed coping skills and options for supporting himself through these challenging times.   Collaboration of care: Other:   Referral to Hospice for group support for parents of children who have overdosed    Patient/Guardian was advised Release of Information must be obtained prior to any record release in order to collaborate their care with an outside provider. Patient/Guardian was advised if they have not already done so to contact the registration department to sign all necessary forms in order for us to release information regarding their care.    Consent: Patient/Guardian gives verbal consent for treatment and assignment of benefits for services provided during this visit. Patient/Guardian expressed understanding and agreed to proceed.     Follow Up Instructions:  I discussed the assessment and treatment plan with the patient. The patient was provided an opportunity to ask questions and all were answered. The patient agreed with the plan and demonstrated an understanding of the instructions.   The patient was advised to call back or seek an in-person evaluation if the symptoms worsen or if the condition fails   to improve as anticipated.  I provided 60 minutes of non-face-to-face time during this encounter.   , S, LCAS 

## 2022-05-21 ENCOUNTER — Encounter: Payer: Self-pay | Admitting: Internal Medicine

## 2022-05-22 ENCOUNTER — Encounter: Payer: Self-pay | Admitting: Internal Medicine

## 2022-05-22 ENCOUNTER — Ambulatory Visit (INDEPENDENT_AMBULATORY_CARE_PROVIDER_SITE_OTHER): Payer: No Typology Code available for payment source | Admitting: Internal Medicine

## 2022-05-22 VITALS — BP 122/78 | HR 90 | Temp 98.6°F | Ht 72.0 in | Wt 178.8 lb

## 2022-05-22 DIAGNOSIS — Z23 Encounter for immunization: Secondary | ICD-10-CM

## 2022-05-22 DIAGNOSIS — F4321 Adjustment disorder with depressed mood: Secondary | ICD-10-CM | POA: Diagnosis not present

## 2022-05-22 DIAGNOSIS — L570 Actinic keratosis: Secondary | ICD-10-CM | POA: Diagnosis not present

## 2022-05-22 DIAGNOSIS — Z634 Disappearance and death of family member: Secondary | ICD-10-CM

## 2022-05-22 DIAGNOSIS — C61 Malignant neoplasm of prostate: Secondary | ICD-10-CM | POA: Diagnosis not present

## 2022-05-22 NOTE — Assessment & Plan Note (Addendum)
July 2023 - dtr died Discussed Alex [name] is in counseling

## 2022-05-22 NOTE — Assessment & Plan Note (Signed)
[facility] - repeat bx is pending

## 2022-05-22 NOTE — Assessment & Plan Note (Addendum)
Several lesions on scalp and forehead.  Derm ref was made Cryo performed-see procedure

## 2022-05-22 NOTE — Progress Notes (Signed)
Subjective:  Patient ID: Alex [name], male    DOB: Dec 14, 1955  Age: 66 y.o. MRN: [id number]  CC: dermatology and Benign Prostatic Hypertrophy   HPI Alex [name] presents for AKs, grief, prostate cancer [name] needs a Derm ref  Outpatient Medications Prior to Visit  Medication Sig Dispense Refill   amLODipine (NORVASC) 5 MG tablet Take 1 tablet (5 mg total) by mouth daily. 90 tablet 3   atorvastatin (LIPITOR) 40 MG tablet TAKE 1 TABLET BY MOUTH EVERY DAY 90 tablet 3   AXIRON 30 MG/ACT SOLN Place 2 Act onto the skin daily.     Coenzyme Q10 (CO Q 10 PO) Take 1 capsule by mouth daily.      Immune Globulin, Human, 4 GM/20ML SOLN Inject into the skin once a week. wednesday     LamoTRIgine 300 MG TB24 24 hour tablet Take 1 tablet (300 mg total) by mouth every morning. 90 tablet 0   latanoprost (XALATAN) 0.005 % ophthalmic solution 1 DROP EACH EYE AT NIGHT     levothyroxine (SYNTHROID) 150 MCG tablet TAKE 1 TABLET BY MOUTH DAILY BEFORE BREAKFAST. 90 tablet 2   LORazepam (ATIVAN) 0.5 MG tablet Take one tab daily and 2nd if needed for anxiety 45 tablet 2   meloxicam (MOBIC) 15 MG tablet TAKE 1 TABLET BY MOUTH EVERY DAY AS NEEDED 90 tablet 0   Multiple Vitamin (MULTI-VITAMINS) TABS Take by mouth.     OMEGA-3 KRILL OIL PO Take 1 capsule by mouth daily.      omeprazole (PRILOSEC) 20 MG capsule Take 20 mg by mouth every evening.     sildenafil (REVATIO) 20 MG tablet Take 1 tablet by mouth once daily 30 tablet 5   tadalafil (CIALIS) 20 MG tablet TAKE 1 TABLET BY MOUTH ONCE DAILY AS NEEDED FOR ERECTILE DYSFUNCTION (Patient taking differently: 5 mg.) 30 tablet 2   tamsulosin (FLOMAX) 0.4 MG CAPS capsule TAKE 1 CAPSULE BY MOUTH EVERY DAY (Patient taking differently: as needed.) 90 capsule 1   telmisartan (MICARDIS) 80 MG tablet Take 1 tablet (80 mg total) by mouth daily. Keep scheduled appointment for further refills 90 tablet 3   timolol (TIMOPTIC) 0.5 % ophthalmic solution timolol maleate 0.5 %  eye drops  INSTILL 1 DROP INTO RIGHT EYE TWICE A DAY     zolpidem (AMBIEN) 10 MG tablet TAKE 1/2 (ONE-HALF) TABLET BY MOUTH AT BEDTIME AND  TAKE  THE  OTHER  HALF  IF  NEEDED 30 tablet 1   aspirin EC 81 MG tablet Take 1 tablet by mouth daily. (Patient not taking: Reported on 05/22/2022)     No facility-administered medications prior to visit.    ROS: Review of Systems  Constitutional:  Positive for fatigue. Negative for appetite change and unexpected weight change.  HENT:  Negative for congestion, nosebleeds, sneezing, sore throat and trouble swallowing.   Eyes:  Negative for itching and visual disturbance.  Respiratory:  Negative for cough.   Cardiovascular:  Negative for chest pain, palpitations and leg swelling.  Gastrointestinal:  Negative for abdominal distention, blood in stool, diarrhea and nausea.  Genitourinary:  Negative for frequency and hematuria.  Musculoskeletal:  Negative for back pain, gait problem, joint swelling and neck pain.  Skin:  Positive for color change. Negative for rash.  Neurological:  Negative for dizziness, tremors, speech difficulty and weakness.  Psychiatric/Behavioral:  Positive for decreased concentration, dysphoric mood and sleep disturbance. Negative for agitation and suicidal ideas. The patient is nervous/anxious.  Objective:  BP 122/78 (BP Location: Left Arm)   Pulse 90   Temp 98.6 F (37 C) (Oral)   Ht 6' (1.829 m)   Wt 178 lb 12.8 oz (81.1 kg)   SpO2 97%   BMI 24.25 kg/m   BP Readings from Last 3 Encounters:  05/22/22 122/78  05/14/22 (!) 132/90  02/26/22 130/82    Wt Readings from Last 3 Encounters:  05/22/22 178 lb 12.8 oz (81.1 kg)  05/14/22 178 lb (80.7 kg)  02/26/22 177 lb (80.3 kg)    Physical Exam Constitutional:      General: He is not in acute distress.    Appearance: He is well-developed.     Comments: NAD  Eyes:     Conjunctiva/sclera: Conjunctivae normal.     Pupils: Pupils are equal, round, and reactive to  light.  Neck:     Thyroid: No thyromegaly.     Vascular: No JVD.  Cardiovascular:     Rate and Rhythm: Normal rate and regular rhythm.     Heart sounds: Normal heart sounds. No murmur heard.    No friction rub. No gallop.  Pulmonary:     Effort: Pulmonary effort is normal. No respiratory distress.     Breath sounds: Normal breath sounds. No wheezing or rales.  Chest:     Chest wall: No tenderness.  Abdominal:     General: Bowel sounds are normal. There is no distension.     Palpations: Abdomen is soft. There is no mass.     Tenderness: There is no abdominal tenderness. There is no guarding or rebound.  Musculoskeletal:        General: No tenderness. Normal range of motion.     Cervical back: Normal range of motion.  Lymphadenopathy:     Cervical: No cervical adenopathy.  Skin:    General: Skin is warm and dry.     Findings: No rash.  Neurological:     Mental Status: He is alert and oriented to person, place, and time.     Cranial Nerves: No cranial nerve deficit.     Motor: No abnormal muscle tone.     Coordination: Coordination normal.     Gait: Gait normal.     Deep Tendon Reflexes: Reflexes are normal and symmetric.  Psychiatric:        Behavior: Behavior normal.        Thought Content: Thought content normal.        Judgment: Judgment normal.   AKs on scalp, face   Procedure Note :     Procedure : Cryosurgery   Indication: Actinic keratosis(es)   Risks including unsuccessful procedure , bleeding, infection, bruising, scar, a need for a repeat  procedure and others were explained to the patient in detail as well as the benefits. Informed consent was obtained verbally.    7 lesion(s)  on scalp, face   was/were treated with liquid nitrogen on a Q-tip in a usual fasion . Band-Aid was applied and antibiotic ointment was given for a later use.   Tolerated well. Complications none.   Postprocedure instructions :     Keep the wounds clean. You can wash them with  liquid soap and water. Pat dry with gauze or a Kleenex tissue  Before applying antibiotic ointment and a Band-Aid.   You need to report immediately  if  any signs of infection develop.    Lab Results  Component Value Date   WBC 7.5 02/26/2022   HGB 15.0  02/26/2022   HCT 46.0 02/26/2022   PLT 256.0 02/26/2022   GLUCOSE 114 (H) 04/22/2022   CHOL 180 10/30/2020   TRIG 98 10/30/2020   HDL 84 10/30/2020   LDLDIRECT 59.0 02/02/2016   LDLCALC 79 10/30/2020   ALT 34 02/26/2022   AST 30 02/26/2022   NA 142 04/22/2022   K 4.5 04/22/2022   CL 103 04/22/2022   CREATININE 1.09 04/22/2022   BUN 18 04/22/2022   CO2 22 04/22/2022   TSH 11.01 (H) 02/26/2022   PSA 5.19 (H) 06/14/2021   INR 1.0 09/13/2019   HGBA1C 5.5 09/29/2018    CT ANGIO CHEST AORTA W/CM & OR WO/CM  Result Date: 04/29/2022 CLINICAL DATA:  Thoracic aortic aneurysm. EXAM: CT ANGIOGRAPHY CHEST WITH CONTRAST TECHNIQUE: Multidetector CT imaging of the chest was performed using the standard protocol during bolus administration of intravenous contrast. Multiplanar CT image reconstructions and MIPs were obtained to evaluate the vascular anatomy. RADIATION DOSE REDUCTION: This exam was performed according to the departmental dose-optimization program which includes automated exposure control, adjustment of the mA and/or kV according to patient size and/or use of iterative reconstruction technique. CONTRAST:  52m ISOVUE-370 IOPAMIDOL (ISOVUE-370) INJECTION 76% COMPARISON:  September 13, 2020. FINDINGS: Cardiovascular: The ascending thoracic aorta is aneurysmal and measures 4.1 cm best seen on image number 87 of series 6. Normal cardiac size. No pericardial effusion. Mediastinum/Nodes: Status post thyroidectomy. The esophagus is unremarkable. No adenopathy is noted. Lungs/Pleura: Lungs are clear. No pleural effusion or pneumothorax. Upper Abdomen: No acute abnormality. Musculoskeletal: No chest wall abnormality. No acute or significant osseous  findings. Review of the MIP images confirms the above findings. IMPRESSION: 4.1 cm ascending thoracic aortic aneurysm. Recommend annual imaging followup by CTA or MRA. This recommendation follows 2010 ACCF/AHA/AATS/ACR/ASA/SCA/SCAI/SIR/STS/SVM Guidelines for the Diagnosis and Management of Patients with Thoracic Aortic Disease. Circulation. 2010; 121:: [id number] Aortic aneurysm NOS (ICD10-I71.9). Aortic Atherosclerosis (ICD10-I70.0). Electronically Signed   By: JMarijo ConceptionM.D.   On: 04/29/2022 15:55    Assessment & Plan:   Problem List Items Addressed This Visit     Actinic keratoses - Primary    Derm ref Cryo      Relevant Orders   Ambulatory referral to Dermatology   Grief at loss of child    J21-Jul-2023- dtr died      Prostate cancer ([facility]    [facility]         No orders of the defined types were placed in this encounter.     Follow-up: Return in about 3 months (around 08/22/2022) for a follow-up visit.  AWalker Kehr MD

## 2022-06-05 ENCOUNTER — Ambulatory Visit: Payer: No Typology Code available for payment source | Admitting: Internal Medicine

## 2022-06-11 ENCOUNTER — Other Ambulatory Visit (HOSPITAL_COMMUNITY): Payer: Self-pay | Admitting: Psychiatry

## 2022-06-11 DIAGNOSIS — F411 Generalized anxiety disorder: Secondary | ICD-10-CM

## 2022-06-12 HISTORY — PX: PROSTATE ABLATION: SHX6042

## 2022-06-24 ENCOUNTER — Telehealth (HOSPITAL_COMMUNITY): Payer: No Typology Code available for payment source | Admitting: Psychiatry

## 2022-06-25 ENCOUNTER — Telehealth: Payer: Self-pay | Admitting: *Deleted

## 2022-06-25 ENCOUNTER — Encounter (HOSPITAL_COMMUNITY): Payer: Self-pay | Admitting: Psychiatry

## 2022-06-25 ENCOUNTER — Telehealth (HOSPITAL_BASED_OUTPATIENT_CLINIC_OR_DEPARTMENT_OTHER): Payer: No Typology Code available for payment source | Admitting: Psychiatry

## 2022-06-25 DIAGNOSIS — F319 Bipolar disorder, unspecified: Secondary | ICD-10-CM | POA: Diagnosis not present

## 2022-06-25 DIAGNOSIS — F411 Generalized anxiety disorder: Secondary | ICD-10-CM

## 2022-06-25 MED ORDER — ZOLPIDEM TARTRATE 10 MG PO TABS: ORAL_TABLET | ORAL | 1 refills | Status: DC

## 2022-06-25 MED ORDER — LAMOTRIGINE ER 300 MG PO TB24: 1.0000 | ORAL_TABLET | ORAL | 0 refills | Status: DC

## 2022-06-25 MED ORDER — LORAZEPAM 0.5 MG PO TABS: ORAL_TABLET | ORAL | 2 refills | Status: DC

## 2022-06-25 NOTE — Telephone Encounter (Signed)
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  She states pt has been using Hizentra sq weekly at home- his insurance is no longer covering this medication and is recommended to change to another form of immunoglobin- with pt requesting IVIG due to prior therapy and good tolerance.  Pt gave Dr Clarene Essex this RN's name and number due to his prior care under Dr Jana Hakim.  This RN informed her this office can do IVIG but pt needs to be established with a provider- noted per Dr Virgie Dad prior dictation per pt's release of care - pt stated if he needed to return he would like to see Dr Marin Olp.  This RN will follow up with Dr Antonieta Pert office per above for appropriateness or other recommendations.

## 2022-06-25 NOTE — Progress Notes (Signed)
Virtual Visit via Telephone Note  I connected with Alex [name] on 06/25/22 at 11:20 AM EST by telephone and verified that I am speaking with the correct person using two identifiers.  Location: Patient: Home Provider: Home Office   I discussed the limitations, risks, security and privacy concerns of performing an evaluation and management service by telephone and the availability of in person appointments. I also discussed with the patient that there may be a patient responsible charge related to this service. The patient expressed understanding and agreed to proceed.   History of Present Illness: Patient is evaluated by phone session.  He continues to grieve about Alex [name] who died on 03-18-23 and the suspect drug overdose.  He had not received a certificate because office is busy.  He reported lately had a prostate biopsy in October and going to have a procedure on December 12.  He admitted there are nights when he does not sleep very well and take extra Ambien.  He reported Alex [name] is not doing well and he had asked Alex to see a therapist but she does not want to see a therapist.  Patient had a good support from Alex family which he describes siblings and kids.  He is hoping a big family dinner on Alex [name] because [name] coming from [place] and another [name] coming from [name] with the grandkids.  He is hoping that we will change the home environment.  Denies any suicidal thoughts or homicidal thoughts.  He does take Ativan when he gets very anxious.  Alex appetite is okay.  Alex weight is unchanged from the past.  He feels anxious about Alex general health and not sure what will happen on upcoming prostate procedure.  He has no tremors, shakes or any EPS.  Denies any drinking or using any illegal substances.  He denies any mania, psychosis, hallucination or any impulsive behavior.  He admitted that sometimes upset or irritated but Ativan helps him calm him down.  Past Psychiatric  History: Reviewed. H/O depression, mood swing, anger and Inpatient at [facility] due to suicidal thoughts but no attempt.  No h/o psychosis but h/o poor impulse control.  Tried Klonopin but did not work.  Took Seroquel, Cymbalta and Remeron (increase depression)    Psychiatric Specialty Exam: Physical Exam  Review of Systems  Weight 178 lb (80.7 kg).There is no height or weight on file to calculate BMI.  General Appearance: NA  Eye Contact:  NA  Speech:  Slow  Volume:  Normal  Mood:  Dysphoric  Affect:  NA  Thought Process:  Descriptions of Associations: Intact  Orientation:  Full (Time, Place, and Person)  Thought Content:  Rumination  Suicidal Thoughts:  No  Homicidal Thoughts:  No  Memory:  Immediate;   Good Recent;   Good Remote;   Fair  Judgement:  Intact  Insight:  Present  Psychomotor Activity:  NA  Concentration:  Concentration: Fair and Attention Span: Fair  Recall:  Good  Fund of Knowledge:  Good  Language:  Good  Akathisia:  No  Handed:  Right  AIMS (if indicated):     Assets:  Communication Skills Desire for Improvement Housing Resilience Transportation  ADL's:  Intact  Cognition:  WNL  Sleep:   fair      Assessment and Plan: Bipolar disorder type I.  Generalized anxiety disorder.  Grief.  We have recommended grief counseling but patient told hospice is busy and not taking any new clients.  Patient was seeing  Alex [name] but she is on medical leave and not sure when she will be back.  I told Alex [name] may come back after Alex [name] but not sure and I offered to see another therapist but patient like to wait. He does not want to change the medication.  He is taking Ambien 5 mg but there are nights he has to take extra half to go to sleep.  Continue Ativan 0.5 mg 1 to 2 tablet as needed for severe anxiety and Lamictal 300 mg daily to help Alex mood symptoms.  Recommend to call us back if is any question or any concern.  Discussed hypnotics and benzodiazepine  dependence tolerance withdrawal.  Discussed safety concerns at any time having active suicidal thoughts or homicidal thought that he need to call 911 or go to local emergency room.  Follow-up in 3 months.  Patient is hoping a big Alex [name] dinner because Alex [name] coming from [name] and [name] with grandkids and he is looking forward to have a family reunion.  Follow Up Instructions:    I discussed the assessment and treatment plan with the patient. The patient was provided an opportunity to ask questions and all were answered. The patient agreed with the plan and demonstrated an understanding of the instructions.   The patient was advised to call back or seek an in-person evaluation if the symptoms worsen or if the condition fails to improve as anticipated.  Collaboration of Care: Other provider involved in patient's care AEB notes are available in epic to review.  Patient/Guardian was advised Release of Information must be obtained prior to any record release in order to collaborate their care with an outside provider. Patient/Guardian was advised if they have not already done so to contact the registration department to sign all necessary forms in order for [place] to release information regarding their care.   Consent: Patient/Guardian gives verbal consent for treatment and assignment of benefits for services provided during this visit. Patient/Guardian expressed understanding and agreed to proceed.    I provided 14 minutes of non-face-to-face time during this encounter.   Kathlee Nations, MD

## 2022-07-02 ENCOUNTER — Telehealth (HOSPITAL_COMMUNITY): Payer: No Typology Code available for payment source | Admitting: Psychiatry

## 2022-07-08 ENCOUNTER — Other Ambulatory Visit: Payer: Self-pay

## 2022-07-08 ENCOUNTER — Inpatient Hospital Stay: Payer: No Typology Code available for payment source | Attending: Hematology & Oncology

## 2022-07-08 ENCOUNTER — Inpatient Hospital Stay (HOSPITAL_BASED_OUTPATIENT_CLINIC_OR_DEPARTMENT_OTHER): Payer: No Typology Code available for payment source | Admitting: Hematology & Oncology

## 2022-07-08 VITALS — BP 118/85 | HR 84 | Temp 98.0°F | Resp 18 | Ht 72.0 in | Wt 181.0 lb

## 2022-07-08 DIAGNOSIS — D839 Common variable immunodeficiency, unspecified: Secondary | ICD-10-CM | POA: Diagnosis not present

## 2022-07-08 DIAGNOSIS — D500 Iron deficiency anemia secondary to blood loss (chronic): Secondary | ICD-10-CM

## 2022-07-08 DIAGNOSIS — D508 Other iron deficiency anemias: Secondary | ICD-10-CM | POA: Diagnosis not present

## 2022-07-08 DIAGNOSIS — Z8589 Personal history of malignant neoplasm of other organs and systems: Secondary | ICD-10-CM

## 2022-07-08 DIAGNOSIS — D849 Immunodeficiency, unspecified: Secondary | ICD-10-CM | POA: Diagnosis not present

## 2022-07-08 DIAGNOSIS — Z85850 Personal history of malignant neoplasm of thyroid: Secondary | ICD-10-CM | POA: Insufficient documentation

## 2022-07-08 LAB — CMP (CANCER CENTER ONLY)
ALT: 31 U/L (ref 0–44)
AST: 28 U/L (ref 15–41)
Albumin: 4.5 g/dL (ref 3.5–5.0)
Alkaline Phosphatase: 80 U/L (ref 38–126)
Anion gap: 9 (ref 5–15)
BUN: 18 mg/dL (ref 8–23)
CO2: 27 mmol/L (ref 22–32)
Calcium: 9 mg/dL (ref 8.9–10.3)
Chloride: 103 mmol/L (ref 98–111)
Creatinine: 1.06 mg/dL (ref 0.61–1.24)
GFR, Estimated: >60 mL/min (ref >60)
Glucose, Bld: 111 mg/dL — ABNORMAL HIGH (ref 70–99)
Potassium: 3.8 mmol/L (ref 3.5–5.1)
Sodium: 139 mmol/L (ref 135–145)
Total Bilirubin: 0.8 mg/dL (ref 0.3–1.2)
Total Protein: 6.8 g/dL (ref 6.5–8.1)

## 2022-07-08 LAB — CBC WITH DIFFERENTIAL (CANCER CENTER ONLY)
Abs Immature Granulocytes: 0.12 10*3/uL — ABNORMAL HIGH (ref 0.00–0.07)
Basophils Absolute: 0.1 10*3/uL (ref 0.0–0.1)
Basophils Relative: 1 %
Eosinophils Absolute: 0.2 10*3/uL (ref 0.0–0.5)
Eosinophils Relative: 2 %
HCT: 45.3 % (ref 39.0–52.0)
Hemoglobin: 15 g/dL (ref 13.0–17.0)
Immature Granulocytes: 1 %
Lymphocytes Relative: 16 %
Lymphs Abs: 1.4 10*3/uL (ref 0.7–4.0)
MCH: 26.9 pg (ref 26.0–34.0)
MCHC: 33.1 g/dL (ref 30.0–36.0)
MCV: 81.2 fL (ref 80.0–100.0)
Monocytes Absolute: 1 10*3/uL (ref 0.1–1.0)
Monocytes Relative: 11 %
Neutro Abs: 6 10*3/uL (ref 1.7–7.7)
Neutrophils Relative %: 69 %
Platelet Count: 240 10*3/uL (ref 150–400)
RBC: 5.58 MIL/uL (ref 4.22–5.81)
RDW: 16.5 % — ABNORMAL HIGH (ref 11.5–15.5)
WBC Count: 8.7 10*3/uL (ref 4.0–10.5)
nRBC: 0 % (ref 0.0–0.2)

## 2022-07-08 LAB — RETICULOCYTES
Immature Retic Fract: 10.3 % (ref 2.3–15.9)
RBC.: 5.67 MIL/uL (ref 4.22–5.81)
Retic Count, Absolute: 70.3 10*3/uL (ref 19.0–186.0)
Retic Ct Pct: 1.2 % (ref 0.4–3.1)

## 2022-07-08 LAB — LACTATE DEHYDROGENASE: LDH: 216 U/L — ABNORMAL HIGH (ref 98–192)

## 2022-07-08 LAB — SAVE SMEAR(SSMR), FOR PROVIDER SLIDE REVIEW

## 2022-07-08 LAB — FERRITIN: Ferritin: 25 ng/mL (ref 24–336)

## 2022-07-08 NOTE — Progress Notes (Signed)
Hematology and Oncology Follow Up Visit  Alex [name] [id number] 28-Oct-1955 66 y.o. 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  Current Therapy:   IVIG 40 g IV monthly     Interim History:  Alex [name] is back for his first office visit.  He has been seen previously by Dr. Jana Hakim.  The last note that we have on him was from a year ago.  He apparently been released by Dr. Jana Hakim back in December 2019.  However, he subsequently was followed up in November because of a skin biopsy showing squamous cell carcinoma.  He has been getting IVIG at home.  This is been subcutaneous.  However, this appears to be running out with respect to insurance covering this.    Medications:  Current Outpatient Medications:    amLODipine (NORVASC) 10 MG tablet, Take 10 mg by mouth daily., Disp: , Rfl:    amLODipine (NORVASC) 5 MG tablet, Take 1 tablet (5 mg total) by mouth daily., Disp: 90 tablet, Rfl: 3   aspirin EC 81 MG tablet, Take 1 tablet by mouth daily. (Patient not taking: Reported on 05/22/2022), Disp: , Rfl:    atorvastatin (LIPITOR) 40 MG tablet, TAKE 1 TABLET BY MOUTH EVERY DAY, Disp: 90 tablet, Rfl: 3   AXIRON 30 MG/ACT SOLN, Place 2 Act onto the skin daily., Disp: , Rfl:    Coenzyme Q10 (CO Q 10 PO), Take 1 capsule by mouth daily. , Disp: , Rfl:    Immune Globulin, Human, 4 GM/20ML SOLN, Inject into the skin once a week. wednesday, Disp: , Rfl:    LamoTRIgine 300 MG TB24 24 hour tablet, Take 1 tablet (300 mg total) by mouth every morning., Disp: 90 tablet, Rfl: 0   latanoprost (XALATAN) 0.005 % ophthalmic solution, 1 DROP EACH EYE AT NIGHT, Disp: , Rfl:    levothyroxine (SYNTHROID) 150 MCG tablet, TAKE 1 TABLET BY MOUTH DAILY BEFORE BREAKFAST., Disp: 90 tablet, Rfl: 2   LORazepam (ATIVAN) 0.5 MG tablet, Take one tab daily and 2nd if needed for anxiety, Disp: 45 tablet, Rfl: 2    meloxicam (MOBIC) 15 MG tablet, TAKE 1 TABLET BY MOUTH EVERY DAY AS NEEDED, Disp: 90 tablet, Rfl: 0   Multiple Vitamin (MULTI-VITAMINS) TABS, Take by mouth., Disp: , Rfl:    OMEGA-3 KRILL OIL PO, Take 1 capsule by mouth daily. , Disp: , Rfl:    omeprazole (PRILOSEC) 20 MG capsule, Take 20 mg by mouth every evening., Disp: , Rfl:    sildenafil (REVATIO) 20 MG tablet, Take 1 tablet by mouth once daily, Disp: 30 tablet, Rfl: 5   tadalafil (CIALIS) 20 MG tablet, TAKE 1 TABLET BY MOUTH ONCE DAILY AS NEEDED FOR ERECTILE DYSFUNCTION (Patient taking differently: 5 mg.), Disp: 30 tablet, Rfl: 2   tadalafil (CIALIS) 5 MG tablet, Take 5 mg by mouth., Disp: , Rfl:    tamsulosin (FLOMAX) 0.4 MG CAPS capsule, TAKE 1 CAPSULE BY MOUTH EVERY DAY (Patient taking differently: as needed.), Disp: 90 capsule, Rfl: 1   telmisartan (MICARDIS) 80 MG tablet, Take 1 tablet (80 mg total) by mouth daily. Keep scheduled appointment for further refills, Disp: 90 tablet, Rfl: 3   timolol (TIMOPTIC) 0.5 % ophthalmic solution, timolol maleate 0.5 % eye drops  INSTILL 1 DROP INTO RIGHT EYE TWICE A DAY, Disp: , Rfl:    zolpidem (AMBIEN) 10 MG tablet, TAKE 1/2 (ONE-HALF) TABLET BY MOUTH AT BEDTIME  AND  TAKE  THE  OTHER  HALF  IF  NEEDED, Disp: 30 tablet, Rfl: 1  Allergies:  Allergies  Allergen Reactions   Percocet [Oxycodone-Acetaminophen] Itching    Can take generic.  States only has a problem with percocet brand, also headache    Past Medical History, Surgical history, Social history, and Family History were reviewed and updated.  Review of Systems: Review of Systems  Constitutional: Negative.   HENT:  Negative.    Eyes: Negative.   Respiratory: Negative.    Cardiovascular: Negative.   Gastrointestinal: Negative.   Endocrine: Negative.   Genitourinary: Negative.    Musculoskeletal: Negative.   Skin: Negative.   Neurological: Negative.   Hematological: Negative.   Psychiatric/Behavioral: Negative.      Physical  Exam:  height is 6' (1.829 m) and weight is 181 lb (82.1 kg). His oral temperature is 98 F (36.7 C). His blood pressure is 118/85 and his pulse is 84. His respiration is 18 and oxygen saturation is 99%.   Wt Readings from Last 3 Encounters:  07/08/22 181 lb (82.1 kg)  05/22/22 178 lb 12.8 oz (81.1 kg)  05/14/22 178 lb (80.7 kg)    Physical Exam Vitals reviewed.  HENT:     Head: Normocephalic and atraumatic.  Eyes:     Pupils: Pupils are equal, round, and reactive to light.  Cardiovascular:     Rate and Rhythm: Normal rate and regular rhythm.     Heart sounds: Normal heart sounds.  Pulmonary:     Effort: Pulmonary effort is normal.     Breath sounds: Normal breath sounds.  Abdominal:     General: Bowel sounds are normal.     Palpations: Abdomen is soft.  Musculoskeletal:        General: No tenderness or deformity. Normal range of motion.     Cervical back: Normal range of motion.  Lymphadenopathy:     Cervical: No cervical adenopathy.  Skin:    General: Skin is warm and dry.     Findings: No erythema or rash.  Neurological:     Mental Status: He is alert and oriented to person, place, and time.  Psychiatric:        Behavior: Behavior normal.        Thought Content: Thought content normal.        Judgment: Judgment normal.      Lab Results  Component Value Date   WBC 8.7 07/08/2022   HGB 15.0 07/08/2022   HCT 45.3 07/08/2022   MCV 81.2 07/08/2022   PLT 240 07/08/2022     Chemistry      Component Value Date/Time   NA 139 07/08/2022 1400   NA 142 04/22/2022 1511   NA 139 06/27/2017 0950   K 3.8 07/08/2022 1400   K 4.0 06/27/2017 0950   CL 103 07/08/2022 1400   CO2 27 07/08/2022 1400   CO2 23 06/27/2017 0950   BUN 18 07/08/2022 1400   BUN 18 04/22/2022 1511   BUN 19.2 06/27/2017 0950   CREATININE 1.06 07/08/2022 1400   CREATININE 1.1 06/27/2017 0950      Component Value Date/Time   CALCIUM 9.0 07/08/2022 1400   CALCIUM 8.8 06/27/2017 0950   ALKPHOS  80 07/08/2022 1400   ALKPHOS 74 06/27/2017 0950   AST 28 07/08/2022 1400   AST 26 06/27/2017 0950   ALT 31 07/08/2022 1400   ALT 40 06/27/2017 0950   BILITOT 0.8 07/08/2022 1400   BILITOT 1.05  06/27/2017 0950      Impression and Plan: Alex [name] is a very nice 66 year old male.  He has multiple problems.  He has had a history of periorbital squamous cell carcinoma.  This was treated.  He had thyroid cancer.  This also was treated and should not be a problem.  His main problem now is the CVID.  His last IgG level was okay at 99 mg/dL.  We have to do his IVIG.  We will do this the week before Christmas.  His last iron studies also looked okay.  His ferritin was 25 with an iron saturation of 19%.  He has not anemic which is certainly encouraging.  Again, our goal is to make sure that he does does not have issues with infections.  We will plan to get him back to see [place] in January.   Volanda Napoleon, MD 11/27/20234:53 PM

## 2022-07-09 LAB — IGG, IGA, IGM
IgA: 64 mg/dL (ref 61–437)
IgG (Immunoglobin G), Serum: 909 mg/dL (ref 603–1613)
IgM (Immunoglobulin M), Srm: 18 mg/dL — ABNORMAL LOW (ref 20–172)

## 2022-07-09 LAB — IRON AND IRON BINDING CAPACITY (CC-WL,HP ONLY)
Iron: 92 ug/dL (ref 45–182)
Saturation Ratios: 19 % (ref 17.9–39.5)
TIBC: 491 ug/dL — ABNORMAL HIGH (ref 250–450)
UIBC: 399 ug/dL — ABNORMAL HIGH (ref 117–376)

## 2022-07-10 LAB — HGB FRACTIONATION CASCADE
Hgb A2: 2.5 % (ref 1.8–3.2)
Hgb A: 97.5 % (ref 96.4–98.8)
Hgb F: 0 % (ref 0.0–2.0)
Hgb S: 0 %

## 2022-07-13 LAB — THYROGLOBULIN LEVEL: Thyroglobulin: 3.2 ng/mL

## 2022-07-14 ENCOUNTER — Encounter: Payer: Self-pay | Admitting: Hematology & Oncology

## 2022-07-15 ENCOUNTER — Telehealth: Payer: Self-pay | Admitting: Internal Medicine

## 2022-07-15 DIAGNOSIS — R300 Dysuria: Secondary | ICD-10-CM

## 2022-07-15 NOTE — Telephone Encounter (Signed)
PT calls today in regards to possible urinalysis and recent prostate cancer treatment. PT had a treatment for his prostate cancer done and has been given a catheter. PT was fine with the catheter for the first 4 days but is now dealing with a constant burning sensation within their bladder.  They had gotten this information back to their Oncologist and they recommended he find somewhere to get a urinalysis done to check for infection. I had informed PT that we would need an OK from Dr.Plotnikov and orders to be placed.  CB for PT: [phone]

## 2022-07-16 ENCOUNTER — Other Ambulatory Visit (INDEPENDENT_AMBULATORY_CARE_PROVIDER_SITE_OTHER): Payer: No Typology Code available for payment source

## 2022-07-16 DIAGNOSIS — R300 Dysuria: Secondary | ICD-10-CM | POA: Diagnosis not present

## 2022-07-16 LAB — URINALYSIS, ROUTINE W REFLEX MICROSCOPIC
Bilirubin Urine: NEGATIVE
Ketones, ur: 15 — AB
Leukocytes,Ua: NEGATIVE
Nitrite: NEGATIVE
Specific Gravity, Urine: 1.025 (ref 1.000–1.030)
Total Protein, Urine: 100 — AB
Urine Glucose: NEGATIVE
Urobilinogen, UA: 0.2 (ref 0.0–1.0)
pH: 5.5 (ref 5.0–8.0)

## 2022-07-16 NOTE — Telephone Encounter (Signed)
Notified pt MD response add pt to lab schedule/lmb

## 2022-07-16 NOTE — Addendum Note (Signed)
Addended by: Earnstine Regal on: 07/16/2022 01:43 PM   Modules accepted: Orders

## 2022-07-16 NOTE — Addendum Note (Signed)
Addended by: Earnstine Regal on: 07/16/2022 09:01 AM   Modules accepted: Orders

## 2022-07-16 NOTE — Telephone Encounter (Signed)
Okay urinalysis.  Thank you

## 2022-07-17 ENCOUNTER — Telehealth: Payer: Self-pay

## 2022-07-17 ENCOUNTER — Encounter: Payer: Self-pay | Admitting: Oncology

## 2022-07-17 NOTE — Telephone Encounter (Signed)
Received phone call from patient in reference to his MyChart message going unanswered and having questions as to his plan of care. Reviewed patient message with Dr. Marin Olp who stated that he would be more than happy to take this patient on in his practice and that he would put the orders in for CT scan and IVIG. Pt aware that it will take 7-10 days for the scan to be approved and central scheduling will then call patient to schedule. Pt aware that scheduling will be calling to schedule infusions. Pt verbalized understanding and had no further questions.

## 2022-07-18 DIAGNOSIS — H353121 Nonexudative age-related macular degeneration, left eye, early dry stage: Secondary | ICD-10-CM | POA: Insufficient documentation

## 2022-07-18 DIAGNOSIS — H35351 Cystoid macular degeneration, right eye: Secondary | ICD-10-CM | POA: Insufficient documentation

## 2022-07-18 DIAGNOSIS — E119 Type 2 diabetes mellitus without complications: Secondary | ICD-10-CM | POA: Insufficient documentation

## 2022-07-18 DIAGNOSIS — H16141 Punctate keratitis, right eye: Secondary | ICD-10-CM | POA: Insufficient documentation

## 2022-07-18 DIAGNOSIS — R739 Hyperglycemia, unspecified: Secondary | ICD-10-CM | POA: Insufficient documentation

## 2022-07-18 DIAGNOSIS — H3581 Retinal edema: Secondary | ICD-10-CM | POA: Insufficient documentation

## 2022-07-18 LAB — ALPHA-THALASSEMIA GENOTYPR

## 2022-07-26 ENCOUNTER — Encounter: Payer: Self-pay | Admitting: Oncology

## 2022-07-31 ENCOUNTER — Ambulatory Visit (INDEPENDENT_AMBULATORY_CARE_PROVIDER_SITE_OTHER): Payer: No Typology Code available for payment source | Admitting: Licensed Clinical Social Worker

## 2022-07-31 DIAGNOSIS — F319 Bipolar disorder, unspecified: Secondary | ICD-10-CM

## 2022-08-01 ENCOUNTER — Ambulatory Visit (HOSPITAL_BASED_OUTPATIENT_CLINIC_OR_DEPARTMENT_OTHER)
Admission: RE | Admit: 2022-08-01 | Discharge: 2022-08-01 | Disposition: A | Discharge status: Home / Self Care | Payer: No Typology Code available for payment source | Source: Ambulatory Visit | Attending: Hematology & Oncology | Admitting: Hematology & Oncology

## 2022-08-01 ENCOUNTER — Encounter (HOSPITAL_BASED_OUTPATIENT_CLINIC_OR_DEPARTMENT_OTHER): Payer: Self-pay

## 2022-08-01 DIAGNOSIS — Z8589 Personal history of malignant neoplasm of other organs and systems: Secondary | ICD-10-CM | POA: Diagnosis present

## 2022-08-01 MED ORDER — IOHEXOL 300 MG/ML  SOLN
100.0000 mL | Freq: Once | INTRAMUSCULAR | Status: AC | PRN
  Administered 2022-08-01: 75 mL via INTRAVENOUS

## 2022-08-06 ENCOUNTER — Encounter: Payer: Self-pay | Admitting: *Deleted

## 2022-08-13 ENCOUNTER — Encounter: Payer: Self-pay | Admitting: Oncology

## 2022-08-14 ENCOUNTER — Encounter: Payer: Self-pay | Admitting: Oncology

## 2022-08-15 ENCOUNTER — Ambulatory Visit (INDEPENDENT_AMBULATORY_CARE_PROVIDER_SITE_OTHER): Payer: BC Managed Care – PPO | Admitting: Licensed Clinical Social Worker

## 2022-08-15 DIAGNOSIS — S46291A Other injury of muscle, fascia and tendon of other parts of biceps, right arm, initial encounter: Secondary | ICD-10-CM | POA: Diagnosis not present

## 2022-08-15 DIAGNOSIS — F411 Generalized anxiety disorder: Secondary | ICD-10-CM

## 2022-08-15 DIAGNOSIS — F319 Bipolar disorder, unspecified: Secondary | ICD-10-CM

## 2022-08-17 ENCOUNTER — Encounter: Payer: Self-pay | Admitting: Oncology

## 2022-08-19 ENCOUNTER — Inpatient Hospital Stay: Payer: BLUE CROSS/BLUE SHIELD | Attending: Hematology & Oncology

## 2022-08-19 ENCOUNTER — Encounter: Payer: Self-pay | Admitting: Oncology

## 2022-08-19 ENCOUNTER — Other Ambulatory Visit: Payer: Self-pay

## 2022-08-19 ENCOUNTER — Other Ambulatory Visit: Payer: Self-pay | Admitting: *Deleted

## 2022-08-19 ENCOUNTER — Encounter: Payer: Self-pay | Admitting: Hematology & Oncology

## 2022-08-19 ENCOUNTER — Inpatient Hospital Stay (HOSPITAL_BASED_OUTPATIENT_CLINIC_OR_DEPARTMENT_OTHER): Payer: BLUE CROSS/BLUE SHIELD | Admitting: Hematology & Oncology

## 2022-08-19 VITALS — BP 140/90 | HR 80 | Temp 97.5°F | Resp 18 | Ht 72.0 in | Wt 181.0 lb

## 2022-08-19 DIAGNOSIS — Z85850 Personal history of malignant neoplasm of thyroid: Secondary | ICD-10-CM | POA: Diagnosis not present

## 2022-08-19 DIAGNOSIS — D500 Iron deficiency anemia secondary to blood loss (chronic): Secondary | ICD-10-CM

## 2022-08-19 DIAGNOSIS — D849 Immunodeficiency, unspecified: Secondary | ICD-10-CM

## 2022-08-19 DIAGNOSIS — D509 Iron deficiency anemia, unspecified: Secondary | ICD-10-CM | POA: Diagnosis not present

## 2022-08-19 DIAGNOSIS — Z8589 Personal history of malignant neoplasm of other organs and systems: Secondary | ICD-10-CM

## 2022-08-19 DIAGNOSIS — Z85828 Personal history of other malignant neoplasm of skin: Secondary | ICD-10-CM | POA: Insufficient documentation

## 2022-08-19 DIAGNOSIS — R7989 Other specified abnormal findings of blood chemistry: Secondary | ICD-10-CM

## 2022-08-19 DIAGNOSIS — D839 Common variable immunodeficiency, unspecified: Secondary | ICD-10-CM | POA: Diagnosis not present

## 2022-08-19 LAB — CBC WITH DIFFERENTIAL (CANCER CENTER ONLY)
Abs Immature Granulocytes: 0.15 10*3/uL — ABNORMAL HIGH (ref 0.00–0.07)
Basophils Absolute: 0.1 10*3/uL (ref 0.0–0.1)
Basophils Relative: 1 %
Eosinophils Absolute: 0.4 10*3/uL (ref 0.0–0.5)
Eosinophils Relative: 4 %
HCT: 44.5 % (ref 39.0–52.0)
Hemoglobin: 14.5 g/dL (ref 13.0–17.0)
Immature Granulocytes: 2 %
Lymphocytes Relative: 16 %
Lymphs Abs: 1.6 10*3/uL (ref 0.7–4.0)
MCH: 26.5 pg (ref 26.0–34.0)
MCHC: 32.6 g/dL (ref 30.0–36.0)
MCV: 81.2 fL (ref 80.0–100.0)
Monocytes Absolute: 1.2 10*3/uL — ABNORMAL HIGH (ref 0.1–1.0)
Monocytes Relative: 12 %
Neutro Abs: 6.4 10*3/uL (ref 1.7–7.7)
Neutrophils Relative %: 65 %
Platelet Count: 261 10*3/uL (ref 150–400)
RBC: 5.48 MIL/uL (ref 4.22–5.81)
RDW: 17.1 % — ABNORMAL HIGH (ref 11.5–15.5)
WBC Count: 9.8 10*3/uL (ref 4.0–10.5)
nRBC: 0 % (ref 0.0–0.2)

## 2022-08-19 LAB — CMP (CANCER CENTER ONLY)
ALT: 28 U/L (ref 0–44)
AST: 25 U/L (ref 15–41)
Albumin: 4.6 g/dL (ref 3.5–5.0)
Alkaline Phosphatase: 89 U/L (ref 38–126)
Anion gap: 9 (ref 5–15)
BUN: 21 mg/dL (ref 8–23)
CO2: 28 mmol/L (ref 22–32)
Calcium: 8.8 mg/dL — ABNORMAL LOW (ref 8.9–10.3)
Chloride: 103 mmol/L (ref 98–111)
Creatinine: 1.01 mg/dL (ref 0.61–1.24)
GFR, Estimated: >60 mL/min (ref >60)
Glucose, Bld: 102 mg/dL — ABNORMAL HIGH (ref 70–99)
Potassium: 3.9 mmol/L (ref 3.5–5.1)
Sodium: 140 mmol/L (ref 135–145)
Total Bilirubin: 0.6 mg/dL (ref 0.3–1.2)
Total Protein: 7.3 g/dL (ref 6.5–8.1)

## 2022-08-19 LAB — IRON AND IRON BINDING CAPACITY (CC-WL,HP ONLY)
Iron: 77 ug/dL (ref 45–182)
Saturation Ratios: 18 % (ref 17.9–39.5)
TIBC: 430 ug/dL (ref 250–450)
UIBC: 353 ug/dL (ref 117–376)

## 2022-08-19 LAB — TSH: TSH: 4.142 u[IU]/mL (ref 0.350–4.500)

## 2022-08-19 LAB — RETICULOCYTES
Immature Retic Fract: 17.4 % — ABNORMAL HIGH (ref 2.3–15.9)
RBC.: 5.45 MIL/uL (ref 4.22–5.81)
Retic Count, Absolute: 104.6 10*3/uL (ref 19.0–186.0)
Retic Ct Pct: 1.9 % (ref 0.4–3.1)

## 2022-08-19 LAB — LACTATE DEHYDROGENASE: LDH: 229 U/L — ABNORMAL HIGH (ref 98–192)

## 2022-08-19 LAB — FERRITIN: Ferritin: 34 ng/mL (ref 24–336)

## 2022-08-19 NOTE — Progress Notes (Signed)
Hematology and Oncology Follow Up Visit  Alex [name] [id number] August 04, 1956 67 y.o. 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  Current Therapy:   IVIG 40 g IV monthly     Interim History:  Alex [name] is back for follow-up.  We first saw him back in December.  At that time, he did have a CT scan of the neck and chest.  The CT scan was done on 08/01/2022.  Thankfully, this did not show any evidence of recurrent malignancy.  He does have a past history of thyroid cancer and squamous cell cancer.  We did check his immunoglobulin levels.  He gets immunoglobulin at home.  This is done subcutaneously.  Today, his IgG level was 769 mg/dL.  He does have a slightly low IgM level.  He has had no problems with infections.  He has had no problems with bleeding.  He has had no problems with pain.  His iron studies that we did today showed a ferritin of 34 with an iron saturation of 18%.  His appetite has been okay.  He has had no problems with bowels or bladder.  There is been no bleeding.  He has had no rashes.  He has had no palpable lymph nodes.  He has had no headache.  Currently, I would have to say that his performance status is probably ECOG 1.   Medications:  Current Outpatient Medications:    amLODipine (NORVASC) 10 MG tablet, Take 10 mg by mouth daily., Disp: , Rfl:    amLODipine (NORVASC) 5 MG tablet, Take 1 tablet (5 mg total) by mouth daily., Disp: 90 tablet, Rfl: 3   aspirin EC 81 MG tablet, Take 1 tablet by mouth daily. (Patient not taking: Reported on 05/22/2022), Disp: , Rfl:    atorvastatin (LIPITOR) 40 MG tablet, TAKE 1 TABLET BY MOUTH EVERY DAY, Disp: 90 tablet, Rfl: 3   AXIRON 30 MG/ACT SOLN, Place 2 Act onto the skin daily., Disp: , Rfl:    Coenzyme Q10 (CO Q 10 PO), Take 1 capsule by mouth daily. , Disp: , Rfl:    Immune Globulin, Human, 4 GM/20ML SOLN, Inject  into the skin once a week. wednesday, Disp: , Rfl:    LamoTRIgine 300 MG TB24 24 hour tablet, Take 1 tablet (300 mg total) by mouth every morning., Disp: 90 tablet, Rfl: 0   latanoprost (XALATAN) 0.005 % ophthalmic solution, 1 DROP EACH EYE AT NIGHT, Disp: , Rfl:    levothyroxine (SYNTHROID) 150 MCG tablet, TAKE 1 TABLET BY MOUTH DAILY BEFORE BREAKFAST., Disp: 90 tablet, Rfl: 2   LORazepam (ATIVAN) 0.5 MG tablet, Take one tab daily and 2nd if needed for anxiety, Disp: 45 tablet, Rfl: 2   meloxicam (MOBIC) 15 MG tablet, TAKE 1 TABLET BY MOUTH EVERY DAY AS NEEDED, Disp: 90 tablet, Rfl: 0   Multiple Vitamin (MULTI-VITAMINS) TABS, Take by mouth., Disp: , Rfl:    OMEGA-3 KRILL OIL PO, Take 1 capsule by mouth daily. , Disp: , Rfl:    omeprazole (PRILOSEC) 20 MG capsule, Take 20 mg by mouth every evening., Disp: , Rfl:    sildenafil (REVATIO) 20 MG tablet, Take 1 tablet by mouth once daily, Disp: 30 tablet, Rfl: 5   tadalafil (CIALIS) 20 MG tablet, TAKE 1 TABLET BY MOUTH ONCE DAILY AS NEEDED FOR ERECTILE DYSFUNCTION (Patient taking differently: 5 mg.), Disp: 30 tablet, Rfl: 2   tadalafil (CIALIS)  5 MG tablet, Take 5 mg by mouth., Disp: , Rfl:    tamsulosin (FLOMAX) 0.4 MG CAPS capsule, TAKE 1 CAPSULE BY MOUTH EVERY DAY (Patient taking differently: as needed.), Disp: 90 capsule, Rfl: 1   telmisartan (MICARDIS) 80 MG tablet, Take 1 tablet (80 mg total) by mouth daily. Keep scheduled appointment for further refills, Disp: 90 tablet, Rfl: 3   timolol (TIMOPTIC) 0.5 % ophthalmic solution, timolol maleate 0.5 % eye drops  INSTILL 1 DROP INTO RIGHT EYE TWICE A DAY, Disp: , Rfl:    zolpidem (AMBIEN) 10 MG tablet, TAKE 1/2 (ONE-HALF) TABLET BY MOUTH AT BEDTIME AND  TAKE  THE  OTHER  HALF  IF  NEEDED, Disp: 30 tablet, Rfl: 1  Allergies:  Allergies  Allergen Reactions   Percocet [Oxycodone-Acetaminophen] Itching    Can take generic.  States only has a problem with percocet brand, also headache    Past Medical  History, Surgical history, Social history, and Family History were reviewed and updated.  Review of Systems: Review of Systems  Constitutional: Negative.   HENT:  Negative.    Eyes: Negative.   Respiratory: Negative.    Cardiovascular: Negative.   Gastrointestinal: Negative.   Endocrine: Negative.   Genitourinary: Negative.    Musculoskeletal: Negative.   Skin: Negative.   Neurological: Negative.   Hematological: Negative.   Psychiatric/Behavioral: Negative.      Physical Exam:  height is 6' (1.829 m) and weight is 181 lb (82.1 kg). His oral temperature is 97.5 F (36.4 C) (abnormal). His blood pressure is 140/90 (abnormal) and his pulse is 80. His respiration is 18 and oxygen saturation is 97%.   Wt Readings from Last 3 Encounters:  08/19/22 181 lb (82.1 kg)  07/08/22 181 lb (82.1 kg)  05/22/22 178 lb 12.8 oz (81.1 kg)    Physical Exam Vitals reviewed.  HENT:     Head: Normocephalic and atraumatic.  Eyes:     Pupils: Pupils are equal, round, and reactive to light.  Cardiovascular:     Rate and Rhythm: Normal rate and regular rhythm.     Heart sounds: Normal heart sounds.  Pulmonary:     Effort: Pulmonary effort is normal.     Breath sounds: Normal breath sounds.  Abdominal:     General: Bowel sounds are normal.     Palpations: Abdomen is soft.  Musculoskeletal:        General: No tenderness or deformity. Normal range of motion.     Cervical back: Normal range of motion.  Lymphadenopathy:     Cervical: No cervical adenopathy.  Skin:    General: Skin is warm and dry.     Findings: No erythema or rash.  Neurological:     Mental Status: He is alert and oriented to person, place, and time.  Psychiatric:        Behavior: Behavior normal.        Thought Content: Thought content normal.        Judgment: Judgment normal.     Lab Results  Component Value Date   WBC 9.8 08/19/2022   HGB 14.5 08/19/2022   HCT 44.5 08/19/2022   MCV 81.2 08/19/2022   PLT 261  08/19/2022     Chemistry      Component Value Date/Time   NA 140 08/19/2022 1146   NA 142 04/22/2022 1511   NA 139 06/27/2017 0950   K 3.9 08/19/2022 1146   K 4.0 06/27/2017 0950   CL 103 08/19/2022 1146  CO2 28 08/19/2022 1146   CO2 23 06/27/2017 0950   BUN 21 08/19/2022 1146   BUN 18 04/22/2022 1511   BUN 19.2 06/27/2017 0950   CREATININE 1.01 08/19/2022 1146   CREATININE 1.1 06/27/2017 0950      Component Value Date/Time   CALCIUM 8.8 (L) 08/19/2022 1146   CALCIUM 8.8 06/27/2017 0950   ALKPHOS 89 08/19/2022 1146   ALKPHOS 74 06/27/2017 0950   AST 25 08/19/2022 1146   AST 26 06/27/2017 0950   ALT 28 08/19/2022 1146   ALT 40 06/27/2017 0950   BILITOT 0.6 08/19/2022 1146   BILITOT 1.05 06/27/2017 0950      Impression and Plan: Mr. Swatzell is a very nice 67 year old male.  He has multiple problems.  He has had a history of periorbital squamous cell carcinoma.  This was treated.  He had thyroid cancer.  This also was treated and should not be a problem.  His main problem now is the CVID.  The IVIG is certainly helped him.  We need to make sure that he can still have this at home.  If not, then we will have to do this in the office.  Again I do not see any evidence of malignancy.  I would have to believe that he is cured of the thyroid cancer and the squamous of carcinoma of the skin.  He has had no problems with COVID or Influenza.  His iron studies also looked okay.    Will go ahead and plan to get him back in another couple months or so.  Again, we need to make sure that he does have the IVIG at home that he can do.     Volanda Napoleon, MD 1/8/20241:00 PM

## 2022-08-20 ENCOUNTER — Encounter (HOSPITAL_COMMUNITY): Payer: Self-pay | Admitting: Licensed Clinical Social Worker

## 2022-08-20 ENCOUNTER — Telehealth: Payer: Self-pay

## 2022-08-20 DIAGNOSIS — L570 Actinic keratosis: Secondary | ICD-10-CM | POA: Diagnosis not present

## 2022-08-20 DIAGNOSIS — X32XXXD Exposure to sunlight, subsequent encounter: Secondary | ICD-10-CM | POA: Diagnosis not present

## 2022-08-20 DIAGNOSIS — L119 Acantholytic disorder, unspecified: Secondary | ICD-10-CM | POA: Diagnosis not present

## 2022-08-20 DIAGNOSIS — D485 Neoplasm of uncertain behavior of skin: Secondary | ICD-10-CM | POA: Diagnosis not present

## 2022-08-20 DIAGNOSIS — D225 Melanocytic nevi of trunk: Secondary | ICD-10-CM | POA: Diagnosis not present

## 2022-08-20 DIAGNOSIS — Z1283 Encounter for screening for malignant neoplasm of skin: Secondary | ICD-10-CM | POA: Diagnosis not present

## 2022-08-20 LAB — KAPPA/LAMBDA LIGHT CHAINS
Kappa free light chain: 16.3 mg/L (ref 3.3–19.4)
Kappa, lambda light chain ratio: 2.06 — ABNORMAL HIGH (ref 0.26–1.65)
Lambda free light chains: 7.9 mg/L (ref 5.7–26.3)

## 2022-08-20 LAB — TESTOSTERONE: Testosterone: 352 ng/dL (ref 264–916)

## 2022-08-20 NOTE — Telephone Encounter (Signed)
-----   Message from Volanda Napoleon, MD sent at 08/20/2022 12:59 PM EST ----- Please call and let him know that the thyroid level is okay, the testosterone level is okay, his iron levels are okay.

## 2022-08-20 NOTE — Progress Notes (Signed)
Virtual virtual Video Note  I connected with Alex [name] on 07/31/2022 at 3:00pm EST by video-enabled virtual visit. I verified that I am speaking with the correct person using  two identifiers.I discussed the limitations of evaluation and management by telemedicine and the availability of in person appointments. The patient expressed understanding and agreed to proceed.    LOCATION: Patient: Home  Provider: Home office   History of Present Illness:  Pt was referred by Dr. Adele Schilder for OP therapy for bipolar disorder and anxiety.  Treatment Goal Addressed:  Pt will meet with clinician 1x every 2 weeks for therapy to monitor for progress towards goals and address any barriers to success; Reduce depression from average severity level of 6/10 down to a 4/10 in next 90 days by engaging in 1-2 positive coping skills fdaily as part of developing self-care routine; Reduce average anxiety level from 7/10 down to 5/10 in next 90 days by utilizing 1-2 relaxation skills/grounding skills per day, such as mindful breathing, progressive muscle relaxation, positive visualizations.  Progress towards Treatment Goal: Progressing   Observations/Objective: Patient presented for today's session on time and was alert, oriented x5, with no evidence or self-report of SI/HI or A/V H.  Patient reported ongoing compliance with medication and denied any use of alcohol or illicit substances.  Clinician inquired about patient's current emotional ratings, as well as any significant changes in thoughts, feelings or behavior since previous session.  Patient reported scores of  7/10 for depression, 7/10 for anxiety, 6/10 for anger/irritability. Pt explored his emotional ratings, which remain high. Today is the first day back for therapist who has been on FMLA. Pt provided an update on mental health and physical health while therapist was out. Pt chose not to have an interim therapist but was given resources to use in case of  crisis. Pt reported on his upcoming holiday plans.   Assessment and plan: Counselor will continue to meet with patient to address treatment plan goals. Patient will continue to follow recommendations of providers and implement skills learned in session.    Collaboration of care: Other:   Referral to Hospice for group support for parents of children who have overdosed    Patient/Guardian was advised Release of Information must be obtained prior to any record release in order to collaborate their care with an outside provider. Patient/Guardian was advised if they have not already done so to contact the registration department to sign all necessary forms in order for [place] to release information regarding their care.    Consent: Patient/Guardian gives verbal consent for treatment and assignment of benefits for services provided during this visit. Patient/Guardian expressed understanding and agreed to proceed.     Follow Up Instructions:  I discussed the assessment and treatment plan with the patient. The patient was provided an opportunity to ask questions and all were answered. The patient agreed with the plan and demonstrated an understanding of the instructions.   The patient was advised to call back or seek an in-person evaluation if the symptoms worsen or if the condition fails to improve as anticipated.  I provided 60 minutes of non-face-to-face time during this encounter.   Alilah Mcmeans S, LCAS

## 2022-08-21 ENCOUNTER — Other Ambulatory Visit: Payer: Self-pay

## 2022-08-21 ENCOUNTER — Telehealth: Payer: Self-pay

## 2022-08-21 LAB — PROTEIN ELECTROPHORESIS, SERUM, WITH REFLEX
A/G Ratio: 1.4 (ref 0.7–1.7)
Albumin ELP: 3.7 g/dL (ref 2.9–4.4)
Alpha-1-Globulin: 0.3 g/dL (ref 0.0–0.4)
Alpha-2-Globulin: 0.8 g/dL (ref 0.4–1.0)
Beta Globulin: 0.9 g/dL (ref 0.7–1.3)
Gamma Globulin: 0.7 g/dL (ref 0.4–1.8)
Globulin, Total: 2.7 g/dL (ref 2.2–3.9)
Total Protein ELP: 6.4 g/dL (ref 6.0–8.5)

## 2022-08-21 LAB — IGG, IGA, IGM
IgA: 66 mg/dL (ref 61–437)
IgG (Immunoglobin G), Serum: 769 mg/dL (ref 603–1613)
IgM (Immunoglobulin M), Srm: 16 mg/dL — ABNORMAL LOW (ref 20–172)

## 2022-08-21 NOTE — Telephone Encounter (Signed)
Attempted to call this patient per Dr. Antonieta Pert verbal request to inform patient of the following:  "Call and tell him that the IgG level is perfect.  It is 769.  The subcu IgG that he does at home is working well.  We will have to see about refilling this for him."  Left voicemail for patient to call back for results.

## 2022-08-21 NOTE — Telephone Encounter (Signed)
Attempted to refill IVIG subq medication but [facility] not have [facility]. Will attempt to discuss with patient what pharmacy provides his IVIG medication when he calls back for results.

## 2022-08-22 ENCOUNTER — Ambulatory Visit: Payer: BLUE CROSS/BLUE SHIELD | Admitting: Internal Medicine

## 2022-08-22 ENCOUNTER — Encounter: Payer: Self-pay | Admitting: Oncology

## 2022-08-22 ENCOUNTER — Ambulatory Visit (HOSPITAL_COMMUNITY): Payer: BLUE CROSS/BLUE SHIELD | Admitting: Licensed Clinical Social Worker

## 2022-08-22 ENCOUNTER — Encounter: Payer: Self-pay | Admitting: Internal Medicine

## 2022-08-22 ENCOUNTER — Telehealth: Payer: Self-pay | Admitting: *Deleted

## 2022-08-22 VITALS — BP 118/64 | HR 75 | Temp 98.0°F | Ht 72.0 in | Wt 183.0 lb

## 2022-08-22 DIAGNOSIS — C61 Malignant neoplasm of prostate: Secondary | ICD-10-CM

## 2022-08-22 DIAGNOSIS — F319 Bipolar disorder, unspecified: Secondary | ICD-10-CM

## 2022-08-22 DIAGNOSIS — F4321 Adjustment disorder with depressed mood: Secondary | ICD-10-CM

## 2022-08-22 DIAGNOSIS — F419 Anxiety disorder, unspecified: Secondary | ICD-10-CM

## 2022-08-22 DIAGNOSIS — N529 Male erectile dysfunction, unspecified: Secondary | ICD-10-CM

## 2022-08-22 DIAGNOSIS — E890 Postprocedural hypothyroidism: Secondary | ICD-10-CM

## 2022-08-22 DIAGNOSIS — Z634 Disappearance and death of family member: Secondary | ICD-10-CM

## 2022-08-22 NOTE — Assessment & Plan Note (Signed)
Cont on Lorazepam prn  Potential benefits of a long term benzodiazepines  use as well as potential risks  and complications were explained to the patient and were aknowledged. 

## 2022-08-22 NOTE — Assessment & Plan Note (Addendum)
Discussed Alex [name] is in counseling

## 2022-08-22 NOTE — Assessment & Plan Note (Signed)
[facility]: S/p cryoablation in Nov 2023 [place]

## 2022-08-22 NOTE — Telephone Encounter (Signed)
Return call received from patient, pt informed per order of Dr. Marin Olp that "the IgG level is perfect.  It is 769.  The subcu IgG that he does at home is working well.  We will have to see about refilling this for him."   Pt states that the SQ IgG he receives at home is Hizentra which he takes once a week and the pharmacy he receives this through is the [facility] at [phone]. Informed pt that we would reach out to [facility] for refill of Hizentra.  Pt is appreciative of assistance and has no questions at this time.

## 2022-08-22 NOTE — Assessment & Plan Note (Signed)
On replacement

## 2022-08-22 NOTE — Assessment & Plan Note (Addendum)
Urology offered a pump Sildenafil prn

## 2022-08-22 NOTE — Progress Notes (Signed)
Subjective:  Patient ID: Alex [name], male    DOB: 06/14/1956  Age: 67 y.o. MRN: [id number]  CC: Follow-up (Post surgery )   HPI Alex [name] presents for prostate cancer (s/p cryoablation) F/u grief, ED, anxiety  Outpatient Medications Prior to Visit  Medication Sig Dispense Refill   acetaminophen (TYLENOL) 500 MG tablet Take by mouth.     amLODipine (NORVASC) 5 MG tablet Take 1 tablet (5 mg total) by mouth daily. 90 tablet 3   aspirin EC 81 MG tablet Take 1 tablet by mouth daily.     atorvastatin (LIPITOR) 40 MG tablet TAKE 1 TABLET BY MOUTH EVERY DAY 90 tablet 3   AXIRON 30 MG/ACT SOLN Place 2 Act onto the skin daily.     azithromycin (ZITHROMAX) 250 MG tablet TAKE 2 TABLETS BY MOUTH TODAY, THEN TAKE 1 TABLET DAILY FOR 4 DAYS AS DIRECTED     brimonidine (ALPHAGAN) 0.2 % ophthalmic solution SMARTSIG:In Eye(s)     Coenzyme Q10 (CO Q 10 PO) Take 1 capsule by mouth daily.      EPINEPHrine 0.3 mg/0.3 mL IJ SOAJ injection Inject into the muscle.     erythromycin ophthalmic ointment Place into the right eye at bedtime.     hyoscyamine (LEVSIN) 0.125 MG tablet hyoscyamine sulfate 0.125 mg tablet     Immune Globulin, Human, 4 GM/20ML SOLN Inject into the skin once a week. wednesday     LamoTRIgine 300 MG TB24 24 hour tablet Take 1 tablet (300 mg total) by mouth every morning. 90 tablet 0   latanoprost (XALATAN) 0.005 % ophthalmic solution 1 DROP EACH EYE AT NIGHT     levothyroxine (SYNTHROID) 150 MCG tablet TAKE 1 TABLET BY MOUTH DAILY BEFORE BREAKFAST. 90 tablet 2   LORazepam (ATIVAN) 0.5 MG tablet Take one tab daily and 2nd if needed for anxiety 45 tablet 2   meloxicam (MOBIC) 15 MG tablet TAKE 1 TABLET BY MOUTH EVERY DAY AS NEEDED 90 tablet 0   methocarbamol (ROBAXIN) 500 MG tablet Take by mouth.     metoprolol succinate (TOPROL-XL) 50 MG 24 hr tablet metoprolol succinate ER 50 mg tablet,extended release 24 hr     Multiple Vitamin (MULTI-VITAMINS) TABS Take by mouth.      OMEGA-3 KRILL OIL PO Take 1 capsule by mouth daily.      omeprazole (PRILOSEC) 20 MG capsule Take 20 mg by mouth every evening.     ondansetron (ZOFRAN-ODT) 4 MG disintegrating tablet DISSOLVE 1 TABLET BY MOUTH EVERY 8 HOURS AS NEEDED FOR NAUSEA FOR UP TO 7 DAYS     oxybutynin (DITROPAN) 5 MG tablet Take by mouth.     phenazopyridine (PYRIDIUM) 200 MG tablet Take by mouth.     polyethylene glycol (MIRALAX / GLYCOLAX) 17 g packet Take by mouth.     senna-docusate (SENOKOT-S) 8.6-50 MG tablet Take by mouth.     sildenafil (REVATIO) 20 MG tablet Take 1 tablet by mouth once daily 30 tablet 5   sulfamethoxazole-trimethoprim (BACTRIM DS) 800-160 MG tablet Take 1 tablet by mouth every 12 (twelve) hours.     tadalafil (CIALIS) 5 MG tablet Take 5 mg by mouth.     tamsulosin (FLOMAX) 0.4 MG CAPS capsule TAKE 1 CAPSULE BY MOUTH EVERY DAY (Patient taking differently: as needed.) 90 capsule 1   telmisartan (MICARDIS) 80 MG tablet Take 1 tablet (80 mg total) by mouth daily. Keep scheduled appointment for further refills 90 tablet 3   telmisartan-hydrochlorothiazide (MICARDIS HCT) 80-25 MG  tablet Take 1 tablet by mouth every morning.     timolol (TIMOPTIC) 0.5 % ophthalmic solution timolol maleate 0.5 % eye drops  INSTILL 1 DROP INTO RIGHT EYE TWICE A DAY     traMADol (ULTRAM) 50 MG tablet Take by mouth.     zolpidem (AMBIEN) 10 MG tablet TAKE 1/2 (ONE-HALF) TABLET BY MOUTH AT BEDTIME AND  TAKE  THE  OTHER  HALF  IF  NEEDED 30 tablet 1   amLODipine (NORVASC) 10 MG tablet Take 10 mg by mouth daily.     tadalafil (CIALIS) 20 MG tablet TAKE 1 TABLET BY MOUTH ONCE DAILY AS NEEDED FOR ERECTILE DYSFUNCTION (Patient taking differently: 5 mg.) 30 tablet 2   No facility-administered medications prior to visit.    ROS: Review of Systems  Constitutional:  Positive for fatigue. Negative for appetite change and unexpected weight change.  HENT:  Negative for congestion, nosebleeds, sneezing, sore throat and trouble  swallowing.   Eyes:  Negative for itching and visual disturbance.  Respiratory:  Negative for cough.   Cardiovascular:  Negative for chest pain, palpitations and leg swelling.  Gastrointestinal:  Negative for abdominal distention, blood in stool, diarrhea and nausea.  Genitourinary:  Positive for urgency. Negative for frequency and hematuria.  Musculoskeletal:  Positive for back pain. Negative for gait problem, joint swelling and neck pain.  Skin:  Negative for rash.  Neurological:  Negative for dizziness, tremors, speech difficulty and weakness.  Psychiatric/Behavioral:  Positive for sleep disturbance. Negative for agitation, dysphoric mood and suicidal ideas. The patient is nervous/anxious.     Objective:  BP 118/64 (BP Location: Left Arm, Patient Position: Sitting, Cuff Size: Normal)   Pulse 75   Temp 98 F (36.7 C) (Oral)   Ht 6' (1.829 m)   Wt 183 lb (83 kg)   SpO2 98%   BMI 24.82 kg/m   BP Readings from Last 3 Encounters:  08/22/22 118/64  08/19/22 (!) 140/90  07/08/22 118/85    Wt Readings from Last 3 Encounters:  08/22/22 183 lb (83 kg)  08/19/22 181 lb (82.1 kg)  07/08/22 181 lb (82.1 kg)    Physical Exam Constitutional:      General: He is not in acute distress.    Appearance: Normal appearance. He is well-developed.     Comments: NAD  Eyes:     Conjunctiva/sclera: Conjunctivae normal.     Pupils: Pupils are equal, round, and reactive to light.  Neck:     Thyroid: No thyromegaly.     Vascular: No JVD.  Cardiovascular:     Rate and Rhythm: Normal rate and regular rhythm.     Heart sounds: Normal heart sounds. No murmur heard.    No friction rub. No gallop.  Pulmonary:     Effort: Pulmonary effort is normal. No respiratory distress.     Breath sounds: Normal breath sounds. No wheezing or rales.  Chest:     Chest wall: No tenderness.  Abdominal:     General: Bowel sounds are normal. There is no distension.     Palpations: Abdomen is soft. There is no  mass.     Tenderness: There is no abdominal tenderness. There is no guarding or rebound.  Musculoskeletal:        General: No tenderness. Normal range of motion.     Cervical back: Normal range of motion.  Lymphadenopathy:     Cervical: No cervical adenopathy.  Skin:    General: Skin is warm and dry.  Findings: No rash.  Neurological:     Mental Status: He is alert and oriented to person, place, and time.     Cranial Nerves: No cranial nerve deficit.     Motor: No abnormal muscle tone.     Coordination: Coordination normal.     Gait: Gait abnormal.     Deep Tendon Reflexes: Reflexes are normal and symmetric.  Psychiatric:        Behavior: Behavior normal.        Thought Content: Thought content normal.        Judgment: Judgment normal.   Antalgic gait LS stiff Scars on face  Lab Results  Component Value Date   WBC 9.8 08/19/2022   HGB 14.5 08/19/2022   HCT 44.5 08/19/2022   PLT 261 08/19/2022   GLUCOSE 102 (H) 08/19/2022   CHOL 180 10/30/2020   TRIG 98 10/30/2020   HDL 84 10/30/2020   LDLDIRECT 59.0 02/02/2016   LDLCALC 79 10/30/2020   ALT 28 08/19/2022   AST 25 08/19/2022   NA 140 08/19/2022   K 3.9 08/19/2022   CL 103 08/19/2022   CREATININE 1.01 08/19/2022   BUN 21 08/19/2022   CO2 28 08/19/2022   TSH 4.142 08/19/2022   PSA 5.19 (H) 06/14/2021   INR 1.0 09/13/2019   HGBA1C 5.5 09/29/2018    CT Chest W Contrast  Result Date: 08/05/2022 CLINICAL DATA:  Squamous cell carcinoma of the head and neck. Thyroid cancer. Possible left axillary nodal mass on physical examination. * Tracking Code: BO * EXAM: CT CHEST WITH CONTRAST TECHNIQUE: Multidetector CT imaging of the chest was performed during intravenous contrast administration. RADIATION DOSE REDUCTION: This exam was performed according to the departmental dose-optimization program which includes automated exposure control, adjustment of the mA and/or kV according to patient size and/or use of iterative  reconstruction technique. CONTRAST:  40m OMNIPAQUE IOHEXOL 300 MG/ML  SOLN COMPARISON:  Chest CT 04/29/2022 and 09/13/2020. FINDINGS: Neck findings are dictated separately. Cardiovascular: Atherosclerosis of the aorta, great vessels and coronary arteries. No acute vascular findings are demonstrated. The heart size is normal. There is no pericardial effusion. Mediastinum/Nodes: There are no enlarged mediastinal, hilar or axillary lymph nodes.Status post thyroidectomy without recurrent mass at the thoracic inlet. The trachea and esophagus appear unremarkable. Lungs/Pleura: No pleural effusion or pneumothorax. The lungs are clear, without suspicious nodularity. Upper abdomen: The visualized upper abdomen appears stable without significant findings. There are stable small hepatic cysts. Unchanged heterogeneous enhancement posteriorly in the right hepatic lobe consistent with an incidental portacaval vascular malformation. Musculoskeletal/Chest wall: There is no chest wall mass or suspicious osseous finding. Old left-sided rib fractures and mild spondylosis. IMPRESSION: 1. No evidence of metastatic disease within the chest. Previous thyroidectomy. 2. No evidence of axillary adenopathy or mass. Clinical follow up of any palpable concern recommended. 3.  Aortic Atherosclerosis (ICD10-I70.0). Electronically Signed   By: WRichardean SaleM.D.   On: 08/05/2022 14:20   CT Soft Tissue Neck W Contrast  Result Date: 08/04/2022 CLINICAL DATA:  Head/neck cancer, monitor. 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 Personal history of prostate cancer. EXAM: CT NECK WITH CONTRAST TECHNIQUE: Multidetector CT imaging of the neck was performed using the standard protocol following the bolus administration of intravenous contrast. RADIATION DOSE REDUCTION: This exam was performed according to the departmental dose-optimization program which includes automated exposure control, adjustment of the mA  and/or kV according to patient size and/or use of iterative reconstruction technique.  CONTRAST:  42m OMNIPAQUE IOHEXOL 300 MG/ML  SOLN COMPARISON:  CT neck with contrast 07/20/2018 FINDINGS: Pharynx and larynx: No focal mucosal or submucosal lesions are present. Nasopharynx is clear. The soft palate and tongue base are within normal limits. Vallecula and epiglottis are within normal limits. Aryepiglottic folds and piriform sinuses are clear. Vocal cords are midline and symmetric. Trachea is clear. Salivary glands: The right submandibular gland is not visualized. Left submandibular gland and duct are normal. Asymmetric atrophy is noted in the right parotid gland. Left parotid gland is within normal limits. Thyroid: Bilateral thyroidectomy again noted. No significant residual or recurrent thyroid tissue is present. Lymph nodes: No significant cervical adenopathy is present. No significant posterior nodes are present. Visualized portions of the axilla are unremarkable. Vascular: Atherosclerotic calcifications are present in the right common carotid artery and at the bifurcation without a significant stenosis. Calcifications are present at the left carotid bifurcation without a significant stenosis. Limited intracranial: Within normal limits. Visualized orbits: Right lens replacement noted. Globes and orbits are otherwise within normal limits. No residual recurrent mass lesion is present. Mastoids and visualized paranasal sinuses: Chronic mucosal thickening present in the inferior scratched at chronic mucosal thickening and opacification is present in the right greater than left sphenoid sinus with chronic wall thickening. The paranasal sinuses and mastoid air cells are otherwise clear. Skeleton: Degenerative changes are again noted within the cervical spine. Grade 1 anterolisthesis at C2-3 stable. Slight progression of anterolisthesis as well as progressive degenerative change noted at C4-5. No focal osseous lesions  are present. Upper chest: The lung apices are clear. The thoracic inlet is within normal limits. Other: Asymmetric subcutaneous soft tissue noted in the right neck on this study and previously. Question prior radiation. IMPRESSION: 1. Bilateral thyroidectomy without significant residual or recurrent thyroid tissue. 2. No significant cervical adenopathy. 3. Asymmetric changes to the right neck including asymmetric atrophy of the subcutaneous soft tissues and right parotid gland. Atrophy or surgical absence of the right submandibular gland. Question previous radiation or partial neck dissection. 4. Chronic sphenoid sinus disease, right greater than left. 5. Progressive degenerative changes in the cervical spine. Electronically Signed   By: CSan MorelleM.D.   On: 08/04/2022 14:45    Assessment & Plan:   Problem List Items Addressed This Visit       Endocrine   Postoperative hypothyroidism    On replacement      Relevant Medications   metoprolol succinate (TOPROL-XL) 50 MG 24 hr tablet     Genitourinary   Prostate cancer ([facility]    [facility]: S/p cryoablation in Nov 2023 [facility]      Relevant Medications   azithromycin (ZITHROMAX) 250 MG tablet   ondansetron (ZOFRAN-ODT) 4 MG disintegrating tablet   sulfamethoxazole-trimethoprim (BACTRIM DS) 800-160 MG tablet     Other   Grief at loss of child    Discussed [name] is in counseling      Erectile dysfunction    Urology offered a pump Sildenafil prn      Anxiety disorder    Cont on Lorazepam prn  Potential benefits of a long term benzodiazepines  use as well as potential risks  and complications were explained to the patient and were aknowledged.         No orders of the defined types were placed in this encounter.     Follow-up: Return in about 3 months (around 11/21/2022) for a follow-up visit.  AWalker Kehr MD

## 2022-08-23 ENCOUNTER — Other Ambulatory Visit (HOSPITAL_COMMUNITY): Payer: Self-pay | Admitting: Psychiatry

## 2022-08-23 DIAGNOSIS — F411 Generalized anxiety disorder: Secondary | ICD-10-CM

## 2022-08-29 ENCOUNTER — Ambulatory Visit: Payer: No Typology Code available for payment source | Admitting: Internal Medicine

## 2022-08-30 ENCOUNTER — Ambulatory Visit (INDEPENDENT_AMBULATORY_CARE_PROVIDER_SITE_OTHER): Payer: BLUE CROSS/BLUE SHIELD | Admitting: Licensed Clinical Social Worker

## 2022-08-30 DIAGNOSIS — F319 Bipolar disorder, unspecified: Secondary | ICD-10-CM

## 2022-09-02 ENCOUNTER — Encounter: Payer: Self-pay | Admitting: Oncology

## 2022-09-03 ENCOUNTER — Encounter: Payer: Self-pay | Admitting: Hematology & Oncology

## 2022-09-03 ENCOUNTER — Ambulatory Visit (INDEPENDENT_AMBULATORY_CARE_PROVIDER_SITE_OTHER): Payer: BLUE CROSS/BLUE SHIELD | Admitting: Licensed Clinical Social Worker

## 2022-09-03 ENCOUNTER — Telehealth: Payer: Self-pay

## 2022-09-03 DIAGNOSIS — F319 Bipolar disorder, unspecified: Secondary | ICD-10-CM

## 2022-09-03 NOTE — Telephone Encounter (Signed)
Patient called to check on status of hizentra rx that Dr.Ennever has taken over.  [facility] on [facility]/22[facility]22. Called [facility] at [phone], the fax was received but came in "blacked out" verbal order given over the phone to Fritz Pickerel, Pharmacist.   Hizentra 20% PFS/vials- Infuse 8gm (19m) subq every week.   Patient called and informed.

## 2022-09-04 DIAGNOSIS — M12572 Traumatic arthropathy, left ankle and foot: Secondary | ICD-10-CM | POA: Diagnosis not present

## 2022-09-08 ENCOUNTER — Encounter (HOSPITAL_COMMUNITY): Payer: Self-pay | Admitting: Licensed Clinical Social Worker

## 2022-09-08 NOTE — Progress Notes (Signed)
Virtual virtual Video Note  I connected with Alex [name] on 08/15/2022 at 4:00pm EST by video-enabled virtual visit. I verified that I am speaking with the correct person using  two identifiers.I discussed the limitations of evaluation and management by telemedicine and the availability of in person appointments. The patient expressed understanding and agreed to proceed.    LOCATION: Patient: Home  Provider: Home office   History of Present Illness:  Pt was referred by Dr. Adele Schilder for OP therapy for bipolar disorder and anxiety.  Treatment Goal Addressed:  Pt will meet with clinician 1x every 2 weeks for therapy to monitor for progress towards goals and address any barriers to success; Reduce depression from average severity level of 6/10 down to a 4/10 in next 90 days by engaging in 1-2 positive coping skills fdaily as part of developing self-care routine; Reduce average anxiety level from 7/10 down to 5/10 in next 90 days by utilizing 1-2 relaxation skills/grounding skills per day, such as mindful breathing, progressive muscle relaxation, positive visualizations.  Progress towards Treatment Goal: Progressing   Observations/Objective: Patient presented for today's session on time and was alert, oriented x5, with no evidence or self-report of SI/HI or A/V H.  Patient reported ongoing compliance with medication and denied any use of alcohol or illicit substances.  Clinician inquired about patient's current emotional ratings, as well as any significant changes in thoughts, feelings or behavior since previous session.  Patient reported scores of  7/10 for depression, 7/10 for anxiety, 6/10 for anger/irritability. Pt explored his emotional ratings, which remain high. Pt reports on his holidays with family. "It was different with my one daughter who passed away earlier this year because of drugs." [name] allowed pt to explore and express thoughts and feelings associated with recent life situations and  external stressors. Pt reports that he is continuing to think about his daughter's death, still not receiving the death certificate. [name] allowed pt to explore his deceased daughter and identify feelings that have been triggered recently. [name] reviewed the grieving process and allowed pt to identify his current stage of grief. [name] continued recommendations: self care behaviors, positive social engagements, focusing on overall work/home/life balance, and focusing on positive physical and emotional wellness.  Assessment and plan: Counselor will continue to meet with patient to address treatment plan goals. Patient will continue to follow recommendations of providers and implement skills learned in session.    Collaboration of care: Other:   Referral to Hospice for group support for parents of children who have overdosed    Patient/Guardian was advised Release of Information must be obtained prior to any record release in order to collaborate their care with an outside provider. Patient/Guardian was advised if they have not already done so to contact the registration department to sign all necessary forms in order for [place] to release information regarding their care.    Consent: Patient/Guardian gives verbal consent for treatment and assignment of benefits for services provided during this visit. Patient/Guardian expressed understanding and agreed to proceed.     Follow Up Instructions:  I discussed the assessment and treatment plan with the patient. The patient was provided an opportunity to ask questions and all were answered. The patient agreed with the plan and demonstrated an understanding of the instructions.   The patient was advised to call back or seek an in-person evaluation if the symptoms worsen or if the condition fails to improve as anticipated.  I provided 60 minutes of non-face-to-face time during this encounter.  Turkessa Ostrom S, LCAS

## 2022-09-10 ENCOUNTER — Ambulatory Visit (INDEPENDENT_AMBULATORY_CARE_PROVIDER_SITE_OTHER): Payer: BLUE CROSS/BLUE SHIELD | Admitting: Licensed Clinical Social Worker

## 2022-09-10 DIAGNOSIS — F319 Bipolar disorder, unspecified: Secondary | ICD-10-CM | POA: Diagnosis not present

## 2022-09-11 ENCOUNTER — Telehealth: Payer: Self-pay

## 2022-09-11 ENCOUNTER — Encounter: Payer: Self-pay | Admitting: *Deleted

## 2022-09-11 NOTE — Progress Notes (Signed)
Hizentra approved by [facility] 09/11/22-03/12/23. [place] - [id number].  Patient will need to call [phone] to arrange for delivery.

## 2022-09-11 NOTE — Telephone Encounter (Signed)
Hizentra has been approved from 09/11/22-03/12/23. He will need to call [facility] @ [phone] to schedule delivery.   Patient called and informed. He was very appreciative and denies any other questions or concerns at this time.

## 2022-09-13 ENCOUNTER — Encounter: Payer: Self-pay | Admitting: Oncology

## 2022-09-17 ENCOUNTER — Ambulatory Visit (HOSPITAL_COMMUNITY): Payer: BLUE CROSS/BLUE SHIELD | Admitting: Licensed Clinical Social Worker

## 2022-09-19 ENCOUNTER — Ambulatory Visit (HOSPITAL_COMMUNITY): Payer: BLUE CROSS/BLUE SHIELD | Admitting: Licensed Clinical Social Worker

## 2022-09-21 ENCOUNTER — Encounter (HOSPITAL_COMMUNITY): Payer: Self-pay | Admitting: Licensed Clinical Social Worker

## 2022-09-21 NOTE — Progress Notes (Signed)
Virtual virtual Video Note  I connected with Alex [name] on 08/22/2022 at 4:00pm EST by video-enabled virtual visit. I verified that I am speaking with the correct person using  two identifiers.I discussed the limitations of evaluation and management by telemedicine and the availability of in person appointments. The patient expressed understanding and agreed to proceed.    LOCATION: Patient: Home  Provider: Home office   History of Present Illness:  Pt was referred by Dr. Adele Schilder for OP therapy for bipolar disorder and anxiety.  Treatment Goal Addressed:  Pt will meet with clinician 1x every 2 weeks for therapy to monitor for progress towards goals and address any barriers to success; Reduce depression from average severity level of 6/10 down to a 4/10 in next 90 days by engaging in 1-2 positive coping skills fdaily as part of developing self-care routine; Reduce average anxiety level from 7/10 down to 5/10 in next 90 days by utilizing 1-2 relaxation skills/grounding skills per day, such as mindful breathing, progressive muscle relaxation, positive visualizations.  Progress towards Treatment Goal: Progressing   Observations/Objective: Patient presented for today's session on time and was alert, oriented x5, with no evidence or self-report of SI/HI or A/V H.  Patient reported ongoing compliance with medication and denied any use of alcohol or illicit substances.  Clinician inquired about patient's current emotional ratings, as well as any significant changes in thoughts, feelings or behavior since previous session.  Patient reported scores of  7/10 for depression, 7/10 for anxiety, 6/10 for anger/irritability. Pt explored his emotional ratings, which remain high. Pt identifies stressors and  allowed pt to explore and express thoughts and feelings associated with recent life situations and external stressors. Pt reports that he is continuing to think about his daughter's death, still not receiving  the death certificate. [name] allowed pt to explore his deceased daughter and identify feelings that have been triggered recently. [name] still grieves the loss of his daughter and becomes tearful in session. [name] still encourages pt to go to hospice for grief counseling, but cln still provides education on grieft to assist the patient with his coping skills. [name] continued recommendations: self care behaviors, positive social engagements, focusing on overall work/home/life balance, and focusing on positive physical and emotional wellness.  Assessment and plan: Counselor will continue to meet with patient to address treatment plan goals. Patient will continue to follow recommendations of providers and implement skills learned in session.    Collaboration of care: Other:   Referral to Hospice for group support for parents of children who have overdosed    Patient/Guardian was advised Release of Information must be obtained prior to any record release in order to collaborate their care with an outside provider. Patient/Guardian was advised if they have not already done so to contact the registration department to sign all necessary forms in order for [place] to release information regarding their care.    Consent: Patient/Guardian gives verbal consent for treatment and assignment of benefits for services provided during this visit. Patient/Guardian expressed understanding and agreed to proceed.     Follow Up Instructions:  I discussed the assessment and treatment plan with the patient. The patient was provided an opportunity to ask questions and all were answered. The patient agreed with the plan and demonstrated an understanding of the instructions.   The patient was advised to call back or seek an in-person evaluation if the symptoms worsen or if the condition fails to improve as anticipated.  I provided 60 minutes of  non-face-to-face time during this encounter.   Heike Pounds S, LCAS

## 2022-09-24 ENCOUNTER — Telehealth (HOSPITAL_BASED_OUTPATIENT_CLINIC_OR_DEPARTMENT_OTHER): Payer: BLUE CROSS/BLUE SHIELD | Admitting: Psychiatry

## 2022-09-24 ENCOUNTER — Encounter (HOSPITAL_COMMUNITY): Payer: Self-pay | Admitting: Psychiatry

## 2022-09-24 VITALS — Wt 174.0 lb

## 2022-09-24 DIAGNOSIS — F4321 Adjustment disorder with depressed mood: Secondary | ICD-10-CM

## 2022-09-24 DIAGNOSIS — F319 Bipolar disorder, unspecified: Secondary | ICD-10-CM | POA: Diagnosis not present

## 2022-09-24 DIAGNOSIS — F411 Generalized anxiety disorder: Secondary | ICD-10-CM | POA: Diagnosis not present

## 2022-09-24 MED ORDER — ZOLPIDEM TARTRATE 10 MG PO TABS: ORAL_TABLET | ORAL | 1 refills | Status: DC

## 2022-09-24 MED ORDER — LAMOTRIGINE ER 300 MG PO TB24: 1.0000 | ORAL_TABLET | ORAL | 0 refills | Status: DC

## 2022-09-24 MED ORDER — LORAZEPAM 0.5 MG PO TABS: ORAL_TABLET | ORAL | 2 refills | Status: DC

## 2022-09-24 NOTE — Progress Notes (Signed)
Virtual Visit via Video Note  I connected with Alex [name] on 09/24/22 at  2:20 PM EST by a video enabled telemedicine application and verified that I am speaking with the correct person using two identifiers.  Location: Patient: Home Provider: Home Office   I discussed the limitations of evaluation and management by telemedicine and the availability of in person appointments. The patient expressed understanding and agreed to proceed.  History of Present Illness: Evaluated by video session.  He reported things are going okay.  There are some stress but stable.  He reported chronic issues with the family especially marital issues.  His 40th wedding anniversary is coming up and he was disappointed because his children did not pitched in provocation.  His sister who lives in [place] for a range the medication at [facility] and he is very happy about upcoming vacation.  He is sleeping good.  He takes the Ambien and sometime repeat when he cannot sleep.  He also started twice a month therapy at [facility] which is free and also keeping therapy appointment with beth.  He has no rash, itching, tremors or shakes.  He endorses wife is not taking well the loss of the daughter and holidays were very difficult.  He is taking Lamictal and reported no rash, itching tremors or shakes.  His appetite is okay.  His weight is stable.  He is hoping to start gym near [place] which so he can use the pool which he likes.  Patient like to keep his current medication.   Past Psychiatric History: Reviewed. H/O depression, mood swing, anger and Inpatient at [facility] due to suicidal thoughts but no attempt.  No h/o psychosis but h/o poor impulse control.  Tried Klonopin but did not work.  Took Seroquel, Cymbalta and Remeron (increase depression)   Psychiatric Specialty Exam: Physical Exam  Review of Systems  Weight 174 lb (78.9 kg).There is no height or weight on file to calculate BMI.  General Appearance: Casual  Eye  Contact:  Good  Speech:  Clear and Coherent  Volume:  Normal  Mood:  Anxious and Dysphoric  Affect:  Appropriate  Thought Process:  Goal Directed  Orientation:  Full (Time, Place, and Person)  Thought Content:  WDL  Suicidal Thoughts:  No  Homicidal Thoughts:  No  Memory:  Immediate;   Good Recent;   Good Remote;   Fair  Judgement:  Intact  Insight:  Present  Psychomotor Activity:  Normal  Concentration:  Concentration: Good and Attention Span: Fair  Recall:  Good  Fund of Knowledge:  Good  Language:  Good  Akathisia:  No  Handed:  Right  AIMS (if indicated):     Assets:  Communication Skills Desire for Improvement Housing Transportation  ADL's:  Intact  Cognition:  WNL  Sleep:   fair      Assessment and Plan: Bipolar disorder type I.  Generalized anxiety disorder.  Grief.  Patient has chronic symptoms but stable.  Excited about upcoming trip to [place] for his 40th wedding anniversary.  He does not want to change the medication.  Continue Ambien 5 mg at bedtime and he can take 1 more tablet if needed.  Continue Ativan 0.5 mg 1 to 2 tablet as needed for severe anxiety and Lamictal 300 mg daily.  Encourage to continue therapy with Alex [name].  Recommended to call us back if is any question or any concern.  Follow-up in 3 months.  Follow Up Instructions:  I discussed the assessment and treatment plan with the patient. The patient was provided an opportunity to ask questions and all were answered. The patient agreed with the plan and demonstrated an understanding of the instructions.   The patient was advised to call back or seek an in-person evaluation if the symptoms worsen or if the condition fails to improve as anticipated.  Collaboration of Care: Other provider involved in patient's care AEB notes are available in epic to review.  Patient/Guardian was advised Release of Information must be obtained prior to any record release in order to collaborate their care  with an outside provider. Patient/Guardian was advised if they have not already done so to contact the registration department to sign all necessary forms in order for [place] to release information regarding their care.   Consent: Patient/Guardian gives verbal consent for treatment and assignment of benefits for services provided during this visit. Patient/Guardian expressed understanding and agreed to proceed.    I provided 20 minutes of non-face-to-face time during this encounter.   Kathlee Nations, MD

## 2022-09-25 DIAGNOSIS — M12572 Traumatic arthropathy, left ankle and foot: Secondary | ICD-10-CM | POA: Diagnosis not present

## 2022-09-25 DIAGNOSIS — M19072 Primary osteoarthritis, left ankle and foot: Secondary | ICD-10-CM | POA: Diagnosis not present

## 2022-09-25 DIAGNOSIS — M19079 Primary osteoarthritis, unspecified ankle and foot: Secondary | ICD-10-CM | POA: Insufficient documentation

## 2022-09-26 ENCOUNTER — Ambulatory Visit (HOSPITAL_COMMUNITY): Payer: BLUE CROSS/BLUE SHIELD | Admitting: Licensed Clinical Social Worker

## 2022-10-05 ENCOUNTER — Encounter (HOSPITAL_COMMUNITY): Payer: Self-pay | Admitting: Licensed Clinical Social Worker

## 2022-10-05 NOTE — Progress Notes (Signed)
Virtual virtual Video Note  I connected with Alex [name] on 08/30/2022 at 10:00am EST by video-enabled virtual visit. I verified that I am speaking with the correct person using  two identifiers.I discussed the limitations of evaluation and management by telemedicine and the availability of in person appointments. The patient expressed understanding and agreed to proceed.    LOCATION: Patient: Home  Provider: Home office   History of Present Illness:  Pt was referred by Dr. Adele Schilder for OP therapy for bipolar disorder and anxiety.  Treatment Goal Addressed:  Pt will meet with clinician 1x every 2 weeks for therapy to monitor for progress towards goals and address any barriers to success; Reduce depression from average severity level of 6/10 down to a 4/10 in next 90 days by engaging in 1-2 positive coping skills fdaily as part of developing self-care routine; Reduce average anxiety level from 7/10 down to 5/10 in next 90 days by utilizing 1-2 relaxation skills/grounding skills per day, such as mindful breathing, progressive muscle relaxation, positive visualizations.  Progress towards Treatment Goal: Progressing   Observations/Objective: Patient presented for today's session on time and was alert, oriented x5, with no evidence or self-report of SI/HI or A/V H.  Patient reported ongoing compliance with medication and denied any use of alcohol or illicit substances.  Clinician inquired about patient's current emotional ratings, as well as any significant changes in thoughts, feelings or behavior since previous session.  Patient reported scores of  7/10 for depression, 7/10 for anxiety, 6/10 for anger/irritability. Pt explored his emotional ratings, which remain high. Pt identifies stressors and  allowed pt to explore and express thoughts and feelings associated with recent life situations and external stressors. Pt reports that he is continuing to think about his daughter's death, still not receiving  the death certificate, which continues to reopen the wounds of her death. Pt still sees counselor at [facility] for prostate cancer surgery and discussed grief with him as well. [name] used CBT to assist patient in exploring his and identify feelings that have been triggered recently about his deceased daughter. [name] still grieves the loss of his daughter and becomes tearful in session. [name] still encourages pt to go to hospice for grief counseling, but cln still provides education on grieft to assist the patient with his coping skills. [name] continued recommendations: self care behaviors, positive social engagements, focusing on overall work/home/life balance, and focusing on positive physical and emotional wellness.  Assessment and plan: Counselor will continue to meet with patient to address treatment plan goals. Patient will continue to follow recommendations of providers and implement skills learned in session.    Collaboration of care: Other:   Referral to Hospice for group support for parents of children who have overdosed    Patient/Guardian was advised Release of Information must be obtained prior to any record release in order to collaborate their care with an outside provider. Patient/Guardian was advised if they have not already done so to contact the registration department to sign all necessary forms in order for [place] to release information regarding their care.    Consent: Patient/Guardian gives verbal consent for treatment and assignment of benefits for services provided during this visit. Patient/Guardian expressed understanding and agreed to proceed.     Follow Up Instructions:  I discussed the assessment and treatment plan with the patient. The patient was provided an opportunity to ask questions and all were answered. The patient agreed with the plan and demonstrated an understanding of the instructions.  The patient was advised to call back or seek an in-person evaluation if the symptoms  worsen or if the condition fails to improve as anticipated.  I provided 60 minutes of non-face-to-face time during this encounter.   Octavia Velador S, LCAS

## 2022-10-09 DIAGNOSIS — M25562 Pain in left knee: Secondary | ICD-10-CM | POA: Diagnosis not present

## 2022-10-09 DIAGNOSIS — M79672 Pain in left foot: Secondary | ICD-10-CM | POA: Diagnosis not present

## 2022-10-14 ENCOUNTER — Encounter (HOSPITAL_COMMUNITY): Payer: Self-pay | Admitting: Licensed Clinical Social Worker

## 2022-10-14 NOTE — Progress Notes (Signed)
Virtual virtual Video Note  I connected with Alex [name] on 09/03/2022 at 3pm EST by video-enabled virtual visit. I verified that I am speaking with the correct person using  two identifiers.I discussed the limitations of evaluation and management by telemedicine and the availability of in person appointments. The patient expressed understanding and agreed to proceed.    LOCATION: Patient: Home  Provider: Home office   History of Present Illness:  Pt was referred by Dr. Adele Schilder for OP therapy for bipolar disorder and anxiety.  Treatment Goal Addressed:  Pt will meet with clinician 1x every 2 weeks for therapy to monitor for progress towards goals and address any barriers to success; Reduce depression from average severity level of 6/10 down to a 4/10 in next 90 days by engaging in 1-2 positive coping skills daily as part of developing self-care routine; Reduce average anxiety level from 7/10 down to 5/10 in next 90 days by utilizing 1-2 relaxation skills/grounding skills per day, such as mindful breathing, progressive muscle relaxation, positive visualizations.  Progress towards Treatment Goal: Progressing   Observations/Objective: Patient presented for today's session on time and was alert, oriented x5, with no evidence or self-report of SI/HI or A/V H.  Patient reported ongoing compliance with medication and denied any use of alcohol or illicit substances.  Clinician inquired about patient's current emotional ratings, as well as any significant changes in thoughts, feelings or behavior since previous session.  Patient reported scores of  6/10 for depression, 6/10 for anxiety, 5/10 for anger/irritability. Pt explored his emotional ratings, which remain high. Pt identifies stressors and  allowed pt to explore and express thoughts and feelings associated with recent life situations and external stressors. Pt reports that he is continuing to think about his daughter's death, still not receiving the  death certificate, which continues to reopen the wounds of her death. Pt still sees counselor at [facility] for prostate cancer and discussed grief with him as well. [name] used CBT to assist patient in exploring and identify feelings that have been triggered recently about his deceased daughter.  He has little support from his family and tries to be strong for everyone else. [name] still encourages pt to go to hospice for grief counseling, but cln still provides education on grief. [name] continued recommendations: self care behaviors, positive social engagements, focusing on overall work/home/life balance, and focusing on positive physical and emotional wellness.  Assessment and plan: Counselor will continue to meet with patient to address treatment plan goals. Patient will continue to follow recommendations of providers and implement skills learned in session.    Collaboration of care: Other:   Referral to Hospice for group support for parents of children who have overdosed    Patient/Guardian was advised Release of Information must be obtained prior to any record release in order to collaborate their care with an outside provider. Patient/Guardian was advised if they have not already done so to contact the registration department to sign all necessary forms in order for [place] to release information regarding their care.    Consent: Patient/Guardian gives verbal consent for treatment and assignment of benefits for services provided during this visit. Patient/Guardian expressed understanding and agreed to proceed.     Follow Up Instructions:  I discussed the assessment and treatment plan with the patient. The patient was provided an opportunity to ask questions and all were answered. The patient agreed with the plan and demonstrated an understanding of the instructions.   The patient was advised to call back  or seek an in-person evaluation if the symptoms worsen or if the condition fails to improve as  anticipated.  I provided 60 minutes of non-face-to-face time during this encounter.   Jacolyn Joaquin S, LCAS

## 2022-10-15 ENCOUNTER — Encounter (HOSPITAL_COMMUNITY): Payer: Self-pay | Admitting: Licensed Clinical Social Worker

## 2022-10-15 ENCOUNTER — Ambulatory Visit (INDEPENDENT_AMBULATORY_CARE_PROVIDER_SITE_OTHER): Payer: BLUE CROSS/BLUE SHIELD | Admitting: Licensed Clinical Social Worker

## 2022-10-15 DIAGNOSIS — F319 Bipolar disorder, unspecified: Secondary | ICD-10-CM | POA: Diagnosis not present

## 2022-10-17 ENCOUNTER — Other Ambulatory Visit: Payer: BLUE CROSS/BLUE SHIELD

## 2022-10-17 ENCOUNTER — Ambulatory Visit: Payer: BLUE CROSS/BLUE SHIELD | Admitting: Hematology & Oncology

## 2022-10-18 DIAGNOSIS — N401 Enlarged prostate with lower urinary tract symptoms: Secondary | ICD-10-CM | POA: Diagnosis not present

## 2022-10-18 DIAGNOSIS — R351 Nocturia: Secondary | ICD-10-CM | POA: Diagnosis not present

## 2022-10-18 DIAGNOSIS — C61 Malignant neoplasm of prostate: Secondary | ICD-10-CM | POA: Diagnosis not present

## 2022-10-18 DIAGNOSIS — E291 Testicular hypofunction: Secondary | ICD-10-CM | POA: Diagnosis not present

## 2022-10-22 ENCOUNTER — Ambulatory Visit (INDEPENDENT_AMBULATORY_CARE_PROVIDER_SITE_OTHER): Payer: BLUE CROSS/BLUE SHIELD | Admitting: Licensed Clinical Social Worker

## 2022-10-22 ENCOUNTER — Encounter (HOSPITAL_COMMUNITY): Payer: Self-pay | Admitting: Licensed Clinical Social Worker

## 2022-10-22 DIAGNOSIS — F319 Bipolar disorder, unspecified: Secondary | ICD-10-CM | POA: Diagnosis not present

## 2022-10-22 NOTE — Progress Notes (Signed)
Virtual virtual Video Note  I connected with Alex [name] on 10/22/2022 at 3pm EST by video-enabled virtual visit. I verified that I am speaking with the correct person using  two identifiers.I discussed the limitations of evaluation and management by telemedicine and the availability of in person appointments. The patient expressed understanding and agreed to proceed.    LOCATION: Patient: Home  Provider: Home office   History of Present Illness:  Pt was referred by Dr. Lolly Mustache for OP therapy for bipolar disorder and anxiety.  Treatment Goal Addressed:  Pt will meet with clinician 1x every 2 weeks for therapy to monitor for progress towards goals and address any barriers to success; Reduce depression from average severity level of 6/10 down to a 4/10 in next 90 days by engaging in 1-2 positive coping skills daily as part of developing self-care routine; Reduce average anxiety level from 7/10 down to 5/10 in next 90 days by utilizing 1-2 relaxation skills/grounding skills per day, such as mindful breathing, progressive muscle relaxation, positive visualizations.  Progress towards Treatment Goal: Progressing   Observations/Objective: Patient presented for today's session on time and was alert, oriented x5, with no evidence or self-report of SI/HI or A/V H.  Patient reported ongoing compliance with medication and denied any use of alcohol or illicit substances.  Clinician inquired about patient's current emotional ratings, as well as any significant changes in thoughts, feelings or behavior since previous session.  Patient reported scores of  6/10 for depression, 6/10 for anxiety, 5/10 for anger/irritability. [name] and Pt explored his emotional ratings, which remain high. Pt identifies stressors and  allowed pt to explore and express thoughts and feelings associated with recent life situations and external stressors. [name] explained external stressors while pt identified his many external stressors -  what he has control over and what he does not. [name] provided education on boundaries as most of his stressors involve his family and his health. He has limited support as therapist has tried to encourage pt to look for outside support.[name] continued recommendations: self care behaviors, positive social engagements, focusing on overall work/home/life balance, and focusing on positive physical and emotional wellness.  Assessment and plan: Counselor will continue to meet with patient to address treatment plan goals. Patient will continue to follow recommendations of providers and implement skills learned in session.    Collaboration of care: Other:   Referral to Hospice for group support for parents of children who have overdosed    Patient/Guardian was advised Release of Information must be obtained prior to any record release in order to collaborate their care with an outside provider. Patient/Guardian was advised if they have not already done so to contact the registration department to sign all necessary forms in order for [place] to release information regarding their care.    Consent: Patient/Guardian gives verbal consent for treatment and assignment of benefits for services provided during this visit. Patient/Guardian expressed understanding and agreed to proceed.     Follow Up Instructions:  I discussed the assessment and treatment plan with the patient. The patient was provided an opportunity to ask questions and all were answered. The patient agreed with the plan and demonstrated an understanding of the instructions.   The patient was advised to call back or seek an in-person evaluation if the symptoms worsen or if the condition fails to improve as anticipated.  I provided 60 minutes of non-face-to-face time during this encounter.   Ronee Ranganathan S, LCAS

## 2022-10-24 DIAGNOSIS — H3589 Other specified retinal disorders: Secondary | ICD-10-CM | POA: Diagnosis not present

## 2022-10-24 DIAGNOSIS — H4051X3 Glaucoma secondary to other eye disorders, right eye, severe stage: Secondary | ICD-10-CM | POA: Diagnosis not present

## 2022-10-24 DIAGNOSIS — H401121 Primary open-angle glaucoma, left eye, mild stage: Secondary | ICD-10-CM | POA: Diagnosis not present

## 2022-10-24 DIAGNOSIS — H25812 Combined forms of age-related cataract, left eye: Secondary | ICD-10-CM | POA: Diagnosis not present

## 2022-10-27 ENCOUNTER — Encounter (HOSPITAL_COMMUNITY): Payer: Self-pay | Admitting: Licensed Clinical Social Worker

## 2022-10-29 ENCOUNTER — Ambulatory Visit (INDEPENDENT_AMBULATORY_CARE_PROVIDER_SITE_OTHER): Payer: BLUE CROSS/BLUE SHIELD | Admitting: Licensed Clinical Social Worker

## 2022-10-29 ENCOUNTER — Encounter (HOSPITAL_COMMUNITY): Payer: Self-pay | Admitting: Licensed Clinical Social Worker

## 2022-10-29 DIAGNOSIS — F319 Bipolar disorder, unspecified: Secondary | ICD-10-CM | POA: Diagnosis not present

## 2022-10-29 NOTE — Progress Notes (Addendum)
Virtual virtual Video Note  I connected with Alex [name] on 10/29/2022 at 2pm EST by video-enabled virtual visit. I verified that I am speaking with the correct person using  two identifiers.I discussed the limitations of evaluation and management by telemedicine and the availability of in person appointments. The patient expressed understanding and agreed to proceed.    LOCATION: Patient: Home  Provider: Home office   History of Present Illness:  Pt was referred by Dr. Lolly Mustache for OP therapy for bipolar disorder and anxiety.  Treatment Goal Addressed:  Pt will meet with clinician 1x every 2 weeks for therapy to monitor for progress towards goals and address any barriers to success; Reduce depression from average severity level of 6/10 down to a 4/10 in next 90 days by engaging in 1-2 positive coping skills daily as part of developing self-care routine; Reduce average anxiety level from 7/10 down to 5/10 in next 90 days by utilizing 1-2 relaxation skills/grounding skills per day, such as mindful breathing, progressive muscle relaxation, positive visualizations.  Progress towards Treatment Goal: Progressing   Observations/Objective: Patient presented for today's session on time and was alert, oriented x5, with no evidence or self-report of SI/HI or A/V H.  Patient reported ongoing compliance with medication and denied any use of alcohol or illicit substances.  Clinician inquired about patient's current emotional ratings, as well as any significant changes in thoughts, feelings or behavior since previous session.  Patient reported scores of  6/10 for depression, 6/10 for anxiety, 5/10 for anger/irritability. Pt explored his emotional ratings, which continue to remain high. Pt identifies stressors and  allowed pt to explore and express thoughts and feelings associated with recent life situations and external stressors. [name] reviewed pt's chart with the on-going care for his prostate cancer and post  surgery. He continues to see dr at [facility] for follow up and sees a counselor at [facility] to help with dealing with the dx of cancer. "I have a lot of stressors: death of my daughter, cancer dx, son's mental health, 2 adult children living in home, relationship issues, continued health issues.[name] continued recommendations: self care behaviors, positive social engagements, focusing on overall work/home/life balance, and focusing on positive physical and emotional wellness.  Assessment and plan: Counselor will continue to meet with patient to address treatment plan goals. Patient will continue to follow recommendations of providers and implement skills learned in session.    Collaboration of care: Other:   Referral to Hospice for group support for parents of children who have overdosed    Patient/Guardian was advised Release of Information must be obtained prior to any record release in order to collaborate their care with an outside provider. Patient/Guardian was advised if they have not already done so to contact the registration department to sign all necessary forms in order for [place] to release information regarding their care.    Consent: Patient/Guardian gives verbal consent for treatment and assignment of benefits for services provided during this visit. Patient/Guardian expressed understanding and agreed to proceed.     Follow Up Instructions:  I discussed the assessment and treatment plan with the patient. The patient was provided an opportunity to ask questions and all were answered. The patient agreed with the plan and demonstrated an understanding of the instructions.   The patient was advised to call back or seek an in-person evaluation if the symptoms worsen or if the condition fails to improve as anticipated.  I provided 60 minutes of non-face-to-face time during this encounter.  Shameika Speelman S, LCAS

## 2022-10-31 ENCOUNTER — Ambulatory Visit: Payer: BLUE CROSS/BLUE SHIELD | Admitting: Hematology & Oncology

## 2022-10-31 ENCOUNTER — Inpatient Hospital Stay: Payer: BLUE CROSS/BLUE SHIELD

## 2022-10-31 DIAGNOSIS — H353121 Nonexudative age-related macular degeneration, left eye, early dry stage: Secondary | ICD-10-CM | POA: Diagnosis not present

## 2022-10-31 DIAGNOSIS — H35351 Cystoid macular degeneration, right eye: Secondary | ICD-10-CM | POA: Diagnosis not present

## 2022-10-31 DIAGNOSIS — H16141 Punctate keratitis, right eye: Secondary | ICD-10-CM | POA: Diagnosis not present

## 2022-10-31 DIAGNOSIS — H3581 Retinal edema: Secondary | ICD-10-CM | POA: Diagnosis not present

## 2022-11-01 DIAGNOSIS — Z8546 Personal history of malignant neoplasm of prostate: Secondary | ICD-10-CM | POA: Diagnosis not present

## 2022-11-03 NOTE — Progress Notes (Signed)
Virtual virtual Video Note  I connected with Alex [name] on 09/10/2022 at 3pm EST by video-enabled virtual visit. I verified that I am speaking with the correct person using  two identifiers.I discussed the limitations of evaluation and management by telemedicine and the availability of in person appointments. The patient expressed understanding and agreed to proceed.    LOCATION: Patient: Home  Provider: Home office   History of Present Illness:  Pt was referred by Dr. Adele Schilder for OP therapy for bipolar disorder and anxiety.  Treatment Goal Addressed:  Pt will meet with clinician 1x every 2 weeks for therapy to monitor for progress towards goals and address any barriers to success; Reduce depression from average severity level of 6/10 down to a 4/10 in next 90 days by engaging in 1-2 positive coping skills daily as part of developing self-care routine; Reduce average anxiety level from 7/10 down to 5/10 in next 90 days by utilizing 1-2 relaxation skills/grounding skills per day, such as mindful breathing, progressive muscle relaxation, positive visualizations.  Progress towards Treatment Goal: Progressing   Observations/Objective: Patient presented for today's session on time and was alert, oriented x5, with no evidence or self-report of SI/HI or A/V H.  Patient reported ongoing compliance with medication and denied any use of alcohol or illicit substances.  Clinician inquired about patient's current emotional ratings, as well as any significant changes in thoughts, feelings or behavior since previous session.  Patient reported scores of  6/10 for depression, 6/10 for anxiety, 5/10 for anger/irritability. Pt explored his emotional ratings, which remain high. Pt identifies stressors and  allowed pt to explore and express thoughts and feelings associated with recent life situations and external stressors. Pt reports that he is continuing to think about his daughter's death, still not receiving the  death certificate, which continues to reopen the wounds of her death. Pt still sees counselor at [facility] for cancer diagnosis. Clinician engaged in discussion on identified positives and utilized MI, OARS to assess how these have impacted client's mood. Clinician allowed space for further processing of emotions and provided supportive statements throughout session.    Assessment and plan: Counselor will continue to meet with patient to address treatment plan goals. Patient will continue to follow recommendations of providers and implement skills learned in session.    Collaboration of care: Other:   Referral to Hospice for group support for parents of children who have overdosed    Patient/Guardian was advised Release of Information must be obtained prior to any record release in order to collaborate their care with an outside provider. Patient/Guardian was advised if they have not already done so to contact the registration department to sign all necessary forms in order for [place] to release information regarding their care.    Consent: Patient/Guardian gives verbal consent for treatment and assignment of benefits for services provided during this visit. Patient/Guardian expressed understanding and agreed to proceed.     Follow Up Instructions:  I discussed the assessment and treatment plan with the patient. The patient was provided an opportunity to ask questions and all were answered. The patient agreed with the plan and demonstrated an understanding of the instructions.   The patient was advised to call back or seek an in-person evaluation if the symptoms worsen or if the condition fails to improve as anticipated.  I provided 60 minutes of non-face-to-face time during this encounter.   Racquelle Hyser S, LCAS

## 2022-11-04 DIAGNOSIS — H35351 Cystoid macular degeneration, right eye: Secondary | ICD-10-CM | POA: Diagnosis not present

## 2022-11-04 DIAGNOSIS — H04121 Dry eye syndrome of right lacrimal gland: Secondary | ICD-10-CM | POA: Diagnosis not present

## 2022-11-04 DIAGNOSIS — H353121 Nonexudative age-related macular degeneration, left eye, early dry stage: Secondary | ICD-10-CM | POA: Diagnosis not present

## 2022-11-04 DIAGNOSIS — T66XXXS Radiation sickness, unspecified, sequela: Secondary | ICD-10-CM | POA: Diagnosis not present

## 2022-11-05 ENCOUNTER — Ambulatory Visit (HOSPITAL_COMMUNITY): Payer: BLUE CROSS/BLUE SHIELD | Admitting: Licensed Clinical Social Worker

## 2022-11-05 ENCOUNTER — Other Ambulatory Visit (HOSPITAL_COMMUNITY): Payer: Self-pay | Admitting: Psychiatry

## 2022-11-05 DIAGNOSIS — F411 Generalized anxiety disorder: Secondary | ICD-10-CM

## 2022-11-06 DIAGNOSIS — Z08 Encounter for follow-up examination after completed treatment for malignant neoplasm: Secondary | ICD-10-CM | POA: Diagnosis not present

## 2022-11-06 DIAGNOSIS — Z8546 Personal history of malignant neoplasm of prostate: Secondary | ICD-10-CM | POA: Diagnosis not present

## 2022-11-12 DIAGNOSIS — M25562 Pain in left knee: Secondary | ICD-10-CM | POA: Diagnosis not present

## 2022-11-14 ENCOUNTER — Ambulatory Visit (INDEPENDENT_AMBULATORY_CARE_PROVIDER_SITE_OTHER): Payer: BLUE CROSS/BLUE SHIELD | Admitting: Licensed Clinical Social Worker

## 2022-11-14 ENCOUNTER — Inpatient Hospital Stay: Payer: BLUE CROSS/BLUE SHIELD | Admitting: Hematology & Oncology

## 2022-11-14 ENCOUNTER — Inpatient Hospital Stay: Payer: BLUE CROSS/BLUE SHIELD | Attending: Hematology & Oncology

## 2022-11-14 ENCOUNTER — Encounter: Payer: Self-pay | Admitting: Hematology & Oncology

## 2022-11-14 ENCOUNTER — Other Ambulatory Visit: Payer: Self-pay

## 2022-11-14 VITALS — BP 139/83 | HR 84 | Temp 97.7°F | Resp 18 | Ht 72.0 in | Wt 183.0 lb

## 2022-11-14 DIAGNOSIS — Z85850 Personal history of malignant neoplasm of thyroid: Secondary | ICD-10-CM | POA: Insufficient documentation

## 2022-11-14 DIAGNOSIS — Z85828 Personal history of other malignant neoplasm of skin: Secondary | ICD-10-CM | POA: Insufficient documentation

## 2022-11-14 DIAGNOSIS — D839 Common variable immunodeficiency, unspecified: Secondary | ICD-10-CM | POA: Diagnosis not present

## 2022-11-14 DIAGNOSIS — C61 Malignant neoplasm of prostate: Secondary | ICD-10-CM

## 2022-11-14 DIAGNOSIS — F319 Bipolar disorder, unspecified: Secondary | ICD-10-CM | POA: Diagnosis not present

## 2022-11-14 DIAGNOSIS — D500 Iron deficiency anemia secondary to blood loss (chronic): Secondary | ICD-10-CM

## 2022-11-14 LAB — CBC WITH DIFFERENTIAL (CANCER CENTER ONLY)
Abs Immature Granulocytes: 0.06 10*3/uL (ref 0.00–0.07)
Basophils Absolute: 0.1 10*3/uL (ref 0.0–0.1)
Basophils Relative: 1 %
Eosinophils Absolute: 0.1 10*3/uL (ref 0.0–0.5)
Eosinophils Relative: 0 %
HCT: 48.8 % (ref 39.0–52.0)
Hemoglobin: 16.1 g/dL (ref 13.0–17.0)
Immature Granulocytes: 0 %
Lymphocytes Relative: 11 %
Lymphs Abs: 1.6 10*3/uL (ref 0.7–4.0)
MCH: 25.7 pg — ABNORMAL LOW (ref 26.0–34.0)
MCHC: 33 g/dL (ref 30.0–36.0)
MCV: 78 fL — ABNORMAL LOW (ref 80.0–100.0)
Monocytes Absolute: 0.9 10*3/uL (ref 0.1–1.0)
Monocytes Relative: 7 %
Neutro Abs: 11.2 10*3/uL — ABNORMAL HIGH (ref 1.7–7.7)
Neutrophils Relative %: 81 %
Platelet Count: 246 10*3/uL (ref 150–400)
RBC: 6.26 MIL/uL — ABNORMAL HIGH (ref 4.22–5.81)
RDW: 16.9 % — ABNORMAL HIGH (ref 11.5–15.5)
WBC Count: 13.9 10*3/uL — ABNORMAL HIGH (ref 4.0–10.5)
nRBC: 0 % (ref 0.0–0.2)

## 2022-11-14 LAB — CMP (CANCER CENTER ONLY)
ALT: 24 U/L (ref 0–44)
AST: 29 U/L (ref 15–41)
Albumin: 4 g/dL (ref 3.5–5.0)
Alkaline Phosphatase: 124 U/L (ref 38–126)
Anion gap: 13 (ref 5–15)
BUN: 24 mg/dL — ABNORMAL HIGH (ref 8–23)
CO2: 20 mmol/L — ABNORMAL LOW (ref 22–32)
Calcium: 8.1 mg/dL — ABNORMAL LOW (ref 8.9–10.3)
Chloride: 101 mmol/L (ref 98–111)
Creatinine: 1.05 mg/dL (ref 0.61–1.24)
GFR, Estimated: >60 mL/min (ref >60)
Glucose, Bld: 160 mg/dL — ABNORMAL HIGH (ref 70–99)
Potassium: 3.6 mmol/L (ref 3.5–5.1)
Sodium: 134 mmol/L — ABNORMAL LOW (ref 135–145)
Total Bilirubin: 0.9 mg/dL (ref 0.3–1.2)
Total Protein: 7.2 g/dL (ref 6.5–8.1)

## 2022-11-14 LAB — IRON AND IRON BINDING CAPACITY (CC-WL,HP ONLY)
Iron: 103 ug/dL (ref 45–182)
Saturation Ratios: 20 % (ref 17.9–39.5)
TIBC: 515 ug/dL — ABNORMAL HIGH (ref 250–450)
UIBC: 412 ug/dL — ABNORMAL HIGH (ref 117–376)

## 2022-11-14 LAB — LACTATE DEHYDROGENASE: LDH: 185 U/L (ref 98–192)

## 2022-11-14 LAB — FERRITIN: Ferritin: 14 ng/mL — ABNORMAL LOW (ref 24–336)

## 2022-11-14 NOTE — Progress Notes (Signed)
Hematology and Oncology Follow Up Visit  NICKSON DIMARE [id number] 07-26-1956 67 y.o. 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  Current Therapy:   IVIG 40 g IV monthly     Interim History:  Mr. Debaun is back for follow-up.  We last saw him back in January.  At that time, his IgG level was 769 mg/dL.  He is doing pretty well.  He has had really no complaints since we last saw him.  He has had no issues with infections.  He does his IVIG at home.  This is done subcutaneously.  He had iron studies that were done today.  His iron saturation was 20%.  I do not have back the ferritin.  He has had no problems with bowels or bladder.  There is been no rashes.  Thankfully, he has had no issues with COVID.  He has had no bleeding.  There is been no leg swelling.  Overall, I would have to say that his performance status is probably ECOG 1.   Medications:  Current Outpatient Medications:    Lutein 20 MG CAPS, Take 1 each by mouth., Disp: , Rfl:    testosterone (ANDROGEL) 50 MG/5GM (1%) GEL, Place onto the skin daily., Disp: , Rfl:    acetaminophen (TYLENOL) 500 MG tablet, Take by mouth., Disp: , Rfl:    amLODipine (NORVASC) 5 MG tablet, Take 1 tablet (5 mg total) by mouth daily., Disp: 90 tablet, Rfl: 3   aspirin EC 81 MG tablet, Take 1 tablet by mouth daily., Disp: , Rfl:    atorvastatin (LIPITOR) 40 MG tablet, TAKE 1 TABLET BY MOUTH EVERY DAY, Disp: 90 tablet, Rfl: 3   AXIRON 30 MG/ACT SOLN, Place 2 Act onto the skin daily., Disp: , Rfl:    azithromycin (ZITHROMAX) 250 MG tablet, TAKE 2 TABLETS BY MOUTH TODAY, THEN TAKE 1 TABLET DAILY FOR 4 DAYS AS DIRECTED, Disp: , Rfl:    brimonidine (ALPHAGAN) 0.2 % ophthalmic solution, SMARTSIG:In Eye(s), Disp: , Rfl:    Coenzyme Q10 (CO Q 10 PO), Take 1 capsule by mouth daily. , Disp: , Rfl:    EPINEPHrine 0.3 mg/0.3 mL IJ SOAJ  injection, Inject into the muscle., Disp: , Rfl:    erythromycin ophthalmic ointment, Place into the right eye at bedtime., Disp: , Rfl:    Immune Globulin, Human, 4 GM/20ML SOLN, Inject into the skin once a week. wednesday, Disp: , Rfl:    LamoTRIgine 300 MG TB24 24 hour tablet, Take 1 tablet (300 mg total) by mouth every morning., Disp: 90 tablet, Rfl: 0   latanoprost (XALATAN) 0.005 % ophthalmic solution, 1 DROP EACH EYE AT NIGHT, Disp: , Rfl:    levothyroxine (SYNTHROID) 150 MCG tablet, TAKE 1 TABLET BY MOUTH DAILY BEFORE BREAKFAST., Disp: 90 tablet, Rfl: 2   LORazepam (ATIVAN) 0.5 MG tablet, Take one tab daily and 2nd if needed for anxiety, Disp: 45 tablet, Rfl: 2   meloxicam (MOBIC) 15 MG tablet, TAKE 1 TABLET BY MOUTH EVERY DAY AS NEEDED, Disp: 90 tablet, Rfl: 0   methocarbamol (ROBAXIN) 500 MG tablet, Take by mouth., Disp: , Rfl:    metoprolol succinate (TOPROL-XL) 50 MG 24 hr tablet, metoprolol succinate ER 50 mg tablet,extended release 24 hr, Disp: , Rfl:    Multiple Vitamin (MULTI-VITAMINS) TABS, Take by mouth., Disp: , Rfl:    OMEGA-3 KRILL OIL PO, Take 1 capsule by mouth daily. ,  Disp: , Rfl:    omeprazole (PRILOSEC) 20 MG capsule, Take 20 mg by mouth every evening., Disp: , Rfl:    ondansetron (ZOFRAN-ODT) 4 MG disintegrating tablet, DISSOLVE 1 TABLET BY MOUTH EVERY 8 HOURS AS NEEDED FOR NAUSEA FOR UP TO 7 DAYS, Disp: , Rfl:    oxybutynin (DITROPAN) 5 MG tablet, Take by mouth., Disp: , Rfl:    phenazopyridine (PYRIDIUM) 200 MG tablet, Take by mouth., Disp: , Rfl:    polyethylene glycol (MIRALAX / GLYCOLAX) 17 g packet, Take by mouth., Disp: , Rfl:    senna-docusate (SENOKOT-S) 8.6-50 MG tablet, Take by mouth., Disp: , Rfl:    sildenafil (REVATIO) 20 MG tablet, Take 1 tablet by mouth once daily, Disp: 30 tablet, Rfl: 5   sulfamethoxazole-trimethoprim (BACTRIM DS) 800-160 MG tablet, Take 1 tablet by mouth every 12 (twelve) hours., Disp: , Rfl:    tadalafil (CIALIS) 5 MG tablet, Take 5  mg by mouth., Disp: , Rfl:    tamsulosin (FLOMAX) 0.4 MG CAPS capsule, TAKE 1 CAPSULE BY MOUTH EVERY DAY (Patient taking differently: as needed.), Disp: 90 capsule, Rfl: 1   telmisartan (MICARDIS) 80 MG tablet, Take 1 tablet (80 mg total) by mouth daily. Keep scheduled appointment for further refills, Disp: 90 tablet, Rfl: 3   timolol (TIMOPTIC) 0.5 % ophthalmic solution, timolol maleate 0.5 % eye drops  INSTILL 1 DROP INTO RIGHT EYE TWICE A DAY, Disp: , Rfl:    traMADol (ULTRAM) 50 MG tablet, Take by mouth., Disp: , Rfl:    zolpidem (AMBIEN) 10 MG tablet, TAKE 1/2 (ONE-HALF) TABLET BY MOUTH AT BEDTIME AND  TAKE  THE  OTHER  HALF  IF  NEEDED, Disp: 30 tablet, Rfl: 1  Allergies:  Allergies  Allergen Reactions   Percocet [Oxycodone-Acetaminophen] Itching    Can take generic.  States only has a problem with percocet brand, also headache    Past Medical History, Surgical history, Social history, and Family History were reviewed and updated.  Review of Systems: Review of Systems  Constitutional: Negative.   HENT:  Negative.    Eyes: Negative.   Respiratory: Negative.    Cardiovascular: Negative.   Gastrointestinal: Negative.   Endocrine: Negative.   Genitourinary: Negative.    Musculoskeletal: Negative.   Skin: Negative.   Neurological: Negative.   Hematological: Negative.   Psychiatric/Behavioral: Negative.      Physical Exam:  height is 6' (1.829 m) and weight is 183 lb (83 kg). His oral temperature is 97.7 F (36.5 C). His blood pressure is 139/83 and his pulse is 84. His respiration is 18 and oxygen saturation is 93%.   Wt Readings from Last 3 Encounters:  11/14/22 183 lb (83 kg)  08/22/22 183 lb (83 kg)  08/19/22 181 lb (82.1 kg)    Physical Exam Vitals reviewed.  HENT:     Head: Normocephalic and atraumatic.  Eyes:     Pupils: Pupils are equal, round, and reactive to light.  Cardiovascular:     Rate and Rhythm: Normal rate and regular rhythm.     Heart sounds:  Normal heart sounds.  Pulmonary:     Effort: Pulmonary effort is normal.     Breath sounds: Normal breath sounds.  Abdominal:     General: Bowel sounds are normal.     Palpations: Abdomen is soft.  Musculoskeletal:        General: No tenderness or deformity. Normal range of motion.     Cervical back: Normal range of motion.  Lymphadenopathy:  Cervical: No cervical adenopathy.  Skin:    General: Skin is warm and dry.     Findings: No erythema or rash.  Neurological:     Mental Status: He is alert and oriented to person, place, and time.  Psychiatric:        Behavior: Behavior normal.        Thought Content: Thought content normal.        Judgment: Judgment normal.     Lab Results  Component Value Date   WBC 13.9 (H) 11/14/2022   HGB 16.1 11/14/2022   HCT 48.8 11/14/2022   MCV 78.0 (L) 11/14/2022   PLT 246 11/14/2022     Chemistry      Component Value Date/Time   NA 140 08/19/2022 1146   NA 142 04/22/2022 1511   NA 139 06/27/2017 0950   K 3.9 08/19/2022 1146   K 4.0 06/27/2017 0950   CL 103 08/19/2022 1146   CO2 28 08/19/2022 1146   CO2 23 06/27/2017 0950   BUN 21 08/19/2022 1146   BUN 18 04/22/2022 1511   BUN 19.2 06/27/2017 0950   CREATININE 1.01 08/19/2022 1146   CREATININE 1.1 06/27/2017 0950      Component Value Date/Time   CALCIUM 8.8 (L) 08/19/2022 1146   CALCIUM 8.8 06/27/2017 0950   ALKPHOS 89 08/19/2022 1146   ALKPHOS 74 06/27/2017 0950   AST 25 08/19/2022 1146   AST 26 06/27/2017 0950   ALT 28 08/19/2022 1146   ALT 40 06/27/2017 0950   BILITOT 0.6 08/19/2022 1146   BILITOT 1.05 06/27/2017 0950      Impression and Plan: Mr. Eilts is a very nice 67 year old male.  He has multiple problems.  He has had a history of periorbital squamous cell carcinoma.  This was treated.  He had thyroid cancer.  This also was treated and should not be a problem.  His main problem now is the CVID.  The IVIG has certainly helped him.  We need to make sure that  he can still have this at home.  If not, then we will have to do this in the office.  Again I do not see any evidence of malignancy.  I would have to believe that he is cured of the thyroid cancer and the squamous of carcinoma of the skin.  He has had no problems with COVID or Influenza.  His iron studies also looked okay.    I do not think we have to do any scans on him.  Will plan to get him back in another 3 months.   Volanda Napoleon, MD 4/4/202412:52 PM

## 2022-11-15 LAB — KAPPA/LAMBDA LIGHT CHAINS
Kappa free light chain: 12.2 mg/L (ref 3.3–19.4)
Kappa, lambda light chain ratio: 1.37 (ref 0.26–1.65)
Lambda free light chains: 8.9 mg/L (ref 5.7–26.3)

## 2022-11-15 LAB — IGG, IGA, IGM
IgA: 76 mg/dL (ref 61–437)
IgG (Immunoglobin G), Serum: 1062 mg/dL (ref 603–1613)
IgM (Immunoglobulin M), Srm: 20 mg/dL (ref 20–172)

## 2022-11-19 ENCOUNTER — Telehealth: Payer: Self-pay | Admitting: *Deleted

## 2022-11-19 ENCOUNTER — Ambulatory Visit (INDEPENDENT_AMBULATORY_CARE_PROVIDER_SITE_OTHER): Payer: BLUE CROSS/BLUE SHIELD | Admitting: Licensed Clinical Social Worker

## 2022-11-19 ENCOUNTER — Encounter: Payer: Self-pay | Admitting: Hematology & Oncology

## 2022-11-19 DIAGNOSIS — F319 Bipolar disorder, unspecified: Secondary | ICD-10-CM | POA: Diagnosis not present

## 2022-11-19 NOTE — Telephone Encounter (Signed)
Per scheduling message Alex [name] - Called patient to get him scheduled for (1) dose iron infusion. Patient did not want to schedule until he talks to billing to get the procedures code and then to his insurance company.

## 2022-11-20 ENCOUNTER — Encounter: Payer: Self-pay | Admitting: Internal Medicine

## 2022-11-20 ENCOUNTER — Encounter: Payer: Self-pay | Admitting: Family

## 2022-11-20 ENCOUNTER — Other Ambulatory Visit: Payer: Self-pay | Admitting: Family

## 2022-11-20 ENCOUNTER — Ambulatory Visit: Payer: BLUE CROSS/BLUE SHIELD | Admitting: Internal Medicine

## 2022-11-20 VITALS — BP 122/84 | HR 65 | Temp 97.8°F | Ht 72.0 in | Wt 179.0 lb

## 2022-11-20 DIAGNOSIS — C61 Malignant neoplasm of prostate: Secondary | ICD-10-CM

## 2022-11-20 DIAGNOSIS — E119 Type 2 diabetes mellitus without complications: Secondary | ICD-10-CM | POA: Diagnosis not present

## 2022-11-20 DIAGNOSIS — M159 Polyosteoarthritis, unspecified: Secondary | ICD-10-CM | POA: Diagnosis not present

## 2022-11-20 DIAGNOSIS — I10 Essential (primary) hypertension: Secondary | ICD-10-CM | POA: Diagnosis not present

## 2022-11-20 DIAGNOSIS — E039 Hypothyroidism, unspecified: Secondary | ICD-10-CM

## 2022-11-20 MED ORDER — LEVOTHYROXINE SODIUM 150 MCG PO TABS: 150.0000 ug | ORAL_TABLET | Freq: Every day | ORAL | 2 refills | Status: DC

## 2022-11-20 MED ORDER — TRIAMCINOLONE ACETONIDE 0.5 % EX OINT: 1.0000 | TOPICAL_OINTMENT | Freq: Four times a day (QID) | CUTANEOUS | 0 refills | Status: AC

## 2022-11-20 NOTE — Assessment & Plan Note (Signed)
Post-op On Levothyroxine

## 2022-11-20 NOTE — Assessment & Plan Note (Signed)
L knee injury 1 mo ago - Dr Jillyn Hidden

## 2022-11-20 NOTE — Assessment & Plan Note (Signed)
Labs were ordered by Dr Ennever 

## 2022-11-20 NOTE — Progress Notes (Signed)
Subjective:  Patient ID: Alex [name], male    DOB: 08/27/1955  Age: 67 y.o. MRN: [id number]  CC: Follow-up (3 mnth f/u)   HPI [facility] presents for anemia - needs infusions, hypogonadism, fatigue L knee injury 1 mo ago - Dr Jillyn Hidden Prostate cancer f/u - PSA q 3 months  Outpatient Medications Prior to Visit  Medication Sig Dispense Refill   acetaminophen (TYLENOL) 500 MG tablet Take by mouth.     amLODipine (NORVASC) 5 MG tablet Take 1 tablet (5 mg total) by mouth daily. 90 tablet 3   aspirin EC 81 MG tablet Take 1 tablet by mouth daily.     atorvastatin (LIPITOR) 40 MG tablet TAKE 1 TABLET BY MOUTH EVERY DAY 90 tablet 3   AXIRON 30 MG/ACT SOLN Place 2 Act onto the skin daily.     azithromycin (ZITHROMAX) 250 MG tablet TAKE 2 TABLETS BY MOUTH TODAY, THEN TAKE 1 TABLET DAILY FOR 4 DAYS AS DIRECTED     brimonidine (ALPHAGAN) 0.2 % ophthalmic solution SMARTSIG:In Eye(s)     Coenzyme Q10 (CO Q 10 PO) Take 1 capsule by mouth daily.      EPINEPHrine 0.3 mg/0.3 mL IJ SOAJ injection Inject into the muscle.     erythromycin ophthalmic ointment Place into the right eye at bedtime.     Immune Globulin, Human, 4 GM/20ML SOLN Inject into the skin once a week. wednesday     LamoTRIgine 300 MG TB24 24 hour tablet Take 1 tablet (300 mg total) by mouth every morning. 90 tablet 0   latanoprost (XALATAN) 0.005 % ophthalmic solution 1 DROP EACH EYE AT NIGHT     LORazepam (ATIVAN) 0.5 MG tablet Take one tab daily and 2nd if needed for anxiety 45 tablet 2   Lutein 20 MG CAPS Take 1 each by mouth.     meloxicam (MOBIC) 15 MG tablet TAKE 1 TABLET BY MOUTH EVERY DAY AS NEEDED 90 tablet 0   methocarbamol (ROBAXIN) 500 MG tablet Take by mouth.     metoprolol succinate (TOPROL-XL) 50 MG 24 hr tablet metoprolol succinate ER 50 mg tablet,extended release 24 hr     Multiple Vitamin (MULTI-VITAMINS) TABS Take by mouth.     OMEGA-3 KRILL OIL PO Take 1 capsule by mouth daily.      omeprazole (PRILOSEC) 20  MG capsule Take 20 mg by mouth every evening.     ondansetron (ZOFRAN-ODT) 4 MG disintegrating tablet DISSOLVE 1 TABLET BY MOUTH EVERY 8 HOURS AS NEEDED FOR NAUSEA FOR UP TO 7 DAYS     oxybutynin (DITROPAN) 5 MG tablet Take by mouth.     phenazopyridine (PYRIDIUM) 200 MG tablet Take by mouth.     polyethylene glycol (MIRALAX / GLYCOLAX) 17 g packet Take by mouth.     senna-docusate (SENOKOT-S) 8.6-50 MG tablet Take by mouth.     sildenafil (REVATIO) 20 MG tablet Take 1 tablet by mouth once daily 30 tablet 5   sulfamethoxazole-trimethoprim (BACTRIM DS) 800-160 MG tablet Take 1 tablet by mouth every 12 (twelve) hours.     tadalafil (CIALIS) 5 MG tablet Take 5 mg by mouth.     tamsulosin (FLOMAX) 0.4 MG CAPS capsule TAKE 1 CAPSULE BY MOUTH EVERY DAY (Patient taking differently: as needed.) 90 capsule 1   telmisartan (MICARDIS) 80 MG tablet Take 1 tablet (80 mg total) by mouth daily. Keep scheduled appointment for further refills 90 tablet 3   testosterone (ANDROGEL) 50 MG/5GM (1%) GEL Place onto the  skin daily.     timolol (TIMOPTIC) 0.5 % ophthalmic solution timolol maleate 0.5 % eye drops  INSTILL 1 DROP INTO RIGHT EYE TWICE A DAY     traMADol (ULTRAM) 50 MG tablet Take by mouth.     zolpidem (AMBIEN) 10 MG tablet TAKE 1/2 (ONE-HALF) TABLET BY MOUTH AT BEDTIME AND  TAKE  THE  OTHER  HALF  IF  NEEDED 30 tablet 1   levothyroxine (SYNTHROID) 150 MCG tablet TAKE 1 TABLET BY MOUTH DAILY BEFORE BREAKFAST. 90 tablet 2   No facility-administered medications prior to visit.    ROS: Review of Systems  Constitutional:  Positive for fatigue. Negative for appetite change and unexpected weight change.  HENT:  Negative for congestion, nosebleeds, sneezing, sore throat and trouble swallowing.   Eyes:  Negative for itching and visual disturbance.  Respiratory:  Negative for cough.   Cardiovascular:  Negative for chest pain, palpitations and leg swelling.  Gastrointestinal:  Negative for abdominal  distention, blood in stool, diarrhea and nausea.  Genitourinary:  Negative for frequency and hematuria.  Musculoskeletal:  Positive for arthralgias and gait problem. Negative for back pain, joint swelling and neck pain.  Skin:  Negative for rash.  Neurological:  Negative for dizziness, tremors, speech difficulty and weakness.  Psychiatric/Behavioral:  Negative for agitation, dysphoric mood and sleep disturbance. The patient is not nervous/anxious.     Objective:  BP 122/84 (BP Location: Right Arm, Patient Position: Sitting, Cuff Size: Large)   Pulse 65   Temp 97.8 F (36.6 C) (Oral)   Ht 6' (1.829 m)   Wt 179 lb (81.2 kg)   SpO2 98%   BMI 24.28 kg/m   BP Readings from Last 3 Encounters:  11/20/22 122/84  11/14/22 139/83  08/22/22 118/64    Wt Readings from Last 3 Encounters:  11/20/22 179 lb (81.2 kg)  11/14/22 183 lb (83 kg)  08/22/22 183 lb (83 kg)    Physical Exam Constitutional:      General: He is not in acute distress.    Appearance: He is well-developed.     Comments: NAD  Eyes:     Conjunctiva/sclera: Conjunctivae normal.     Pupils: Pupils are equal, round, and reactive to light.  Neck:     Thyroid: No thyromegaly.     Vascular: No JVD.  Cardiovascular:     Rate and Rhythm: Normal rate and regular rhythm.     Heart sounds: Normal heart sounds. No murmur heard.    No friction rub. No gallop.  Pulmonary:     Effort: Pulmonary effort is normal. No respiratory distress.     Breath sounds: Normal breath sounds. No wheezing or rales.  Chest:     Chest wall: No tenderness.  Abdominal:     General: Bowel sounds are normal. There is no distension.     Palpations: Abdomen is soft. There is no mass.     Tenderness: There is no abdominal tenderness. There is no guarding or rebound.  Musculoskeletal:        General: No tenderness. Normal range of motion.     Cervical back: Normal range of motion.  Lymphadenopathy:     Cervical: No cervical adenopathy.  Skin:     General: Skin is warm and dry.     Findings: No rash.  Neurological:     Mental Status: He is alert and oriented to person, place, and time.     Cranial Nerves: No cranial nerve deficit.  Motor: No abnormal muscle tone.     Coordination: Coordination normal.     Gait: Gait abnormal.     Deep Tendon Reflexes: Reflexes are normal and symmetric.  Psychiatric:        Behavior: Behavior normal.        Thought Content: Thought content normal.        Judgment: Judgment normal.    Lower lip lesion, dry lips Scars on face Antalgic gait; LS spine w/pain  Lab Results  Component Value Date   WBC 13.9 (H) 11/14/2022   HGB 16.1 11/14/2022   HCT 48.8 11/14/2022   PLT 246 11/14/2022   GLUCOSE 160 (H) 11/14/2022   CHOL 180 10/30/2020   TRIG 98 10/30/2020   HDL 84 10/30/2020   LDLDIRECT 59.0 02/02/2016   LDLCALC 79 10/30/2020   ALT 24 11/14/2022   AST 29 11/14/2022   NA 134 (L) 11/14/2022   K 3.6 11/14/2022   CL 101 11/14/2022   CREATININE 1.05 11/14/2022   BUN 24 (H) 11/14/2022   CO2 20 (L) 11/14/2022   TSH 4.142 08/19/2022   PSA 5.19 (H) 06/14/2021   INR 1.0 09/13/2019   HGBA1C 5.5 09/29/2018    CT Chest W Contrast  Result Date: 08/05/2022 CLINICAL DATA:  Squamous cell carcinoma of the head and neck. Thyroid cancer. Possible left axillary nodal mass on physical examination. * Tracking Code: BO * EXAM: CT CHEST WITH CONTRAST TECHNIQUE: Multidetector CT imaging of the chest was performed during intravenous contrast administration. RADIATION DOSE REDUCTION: This exam was performed according to the departmental dose-optimization program which includes automated exposure control, adjustment of the mA and/or kV according to patient size and/or use of iterative reconstruction technique. CONTRAST:  72mL OMNIPAQUE IOHEXOL 300 MG/ML  SOLN COMPARISON:  Chest CT 04/29/2022 and 09/13/2020. FINDINGS: Neck findings are dictated separately. Cardiovascular: Atherosclerosis of the aorta, great  vessels and coronary arteries. No acute vascular findings are demonstrated. The heart size is normal. There is no pericardial effusion. Mediastinum/Nodes: There are no enlarged mediastinal, hilar or axillary lymph nodes.Status post thyroidectomy without recurrent mass at the thoracic inlet. The trachea and esophagus appear unremarkable. Lungs/Pleura: No pleural effusion or pneumothorax. The lungs are clear, without suspicious nodularity. Upper abdomen: The visualized upper abdomen appears stable without significant findings. There are stable small hepatic cysts. Unchanged heterogeneous enhancement posteriorly in the right hepatic lobe consistent with an incidental portacaval vascular malformation. Musculoskeletal/Chest wall: There is no chest wall mass or suspicious osseous finding. Old left-sided rib fractures and mild spondylosis. IMPRESSION: 1. No evidence of metastatic disease within the chest. Previous thyroidectomy. 2. No evidence of axillary adenopathy or mass. Clinical follow up of any palpable concern recommended. 3.  Aortic Atherosclerosis (ICD10-I70.0). Electronically Signed   By: Carey Bullocks [name]   On: 08/05/2022 14:20   CT Soft Tissue Neck W Contrast  Result Date: 08/04/2022 CLINICAL DATA:  Head/neck cancer, monitor. 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 Personal history of prostate cancer. EXAM: CT NECK WITH CONTRAST TECHNIQUE: Multidetector CT imaging of the neck was performed using the standard protocol following the bolus administration of intravenous contrast. RADIATION DOSE REDUCTION: This exam was performed according to the departmental dose-optimization program which includes automated exposure control, adjustment of the mA and/or kV according to patient size and/or use of iterative reconstruction technique. CONTRAST:  68mL OMNIPAQUE IOHEXOL 300 MG/ML  SOLN COMPARISON:  CT neck with contrast 07/20/2018 FINDINGS: Pharynx and larynx: No focal mucosal or  submucosal lesions are present. Nasopharynx is clear. The soft palate and tongue base are within normal limits. Vallecula and epiglottis are within normal limits. Aryepiglottic folds and piriform sinuses are clear. Vocal cords are midline and symmetric. Trachea is clear. Salivary glands: The right submandibular gland is not visualized. Left submandibular gland and duct are normal. Asymmetric atrophy is noted in the right parotid gland. Left parotid gland is within normal limits. Thyroid: Bilateral thyroidectomy again noted. No significant residual or recurrent thyroid tissue is present. Lymph nodes: No significant cervical adenopathy is present. No significant posterior nodes are present. Visualized portions of the axilla are unremarkable. Vascular: Atherosclerotic calcifications are present in the right common carotid artery and at the bifurcation without a significant stenosis. Calcifications are present at the left carotid bifurcation without a significant stenosis. Limited intracranial: Within normal limits. Visualized orbits: Right lens replacement noted. Globes and orbits are otherwise within normal limits. No residual recurrent mass lesion is present. Mastoids and visualized paranasal sinuses: Chronic mucosal thickening present in the inferior scratched at chronic mucosal thickening and opacification is present in the right greater than left sphenoid sinus with chronic wall thickening. The paranasal sinuses and mastoid air cells are otherwise clear. Skeleton: Degenerative changes are again noted within the cervical spine. Grade 1 anterolisthesis at C2-3 stable. Slight progression of anterolisthesis as well as progressive degenerative change noted at C4-5. No focal osseous lesions are present. Upper chest: The lung apices are clear. The thoracic inlet is within normal limits. Other: Asymmetric subcutaneous soft tissue noted in the right neck on this study and previously. Question prior radiation. IMPRESSION:  1. Bilateral thyroidectomy without significant residual or recurrent thyroid tissue. 2. No significant cervical adenopathy. 3. Asymmetric changes to the right neck including asymmetric atrophy of the subcutaneous soft tissues and right parotid gland. Atrophy or surgical absence of the right submandibular gland. Question previous radiation or partial neck dissection. 4. Chronic sphenoid sinus disease, right greater than left. 5. Progressive degenerative changes in the cervical spine. Electronically Signed   By: Marin Robertshristopher  Mattern [name]   On: 08/04/2022 14:45    Assessment & Plan:   Problem List Items Addressed This Visit       Cardiovascular and Mediastinum   Hypertension, uncontrolled    Labs were ordered by Dr Myna HidalgoEnnever        Endocrine   Diabetes mellitus type 2 without retinopathy    Labs were ordered by Dr Myna HidalgoEnnever      Hypothyroidism (acquired)    Post-op On Levothyroxine      Relevant Medications   levothyroxine (SYNTHROID) 150 MCG tablet     Musculoskeletal and Integument   Osteoarthritis - Primary    L knee injury 1 mo ago - Dr Jillyn HiddenBean        Genitourinary   Prostate cancer    Prostate cancer f/u - PSA q 3 months         Meds ordered this encounter  Medications   triamcinolone ointment (KENALOG) 0.5 %    Sig: Apply 1 Application topically 4 (four) times daily. On the lip    Dispense:  30 g    Refill:  0   levothyroxine (SYNTHROID) 150 MCG tablet    Sig: Take 1 tablet (150 mcg total) by mouth daily before breakfast.    Dispense:  90 tablet    Refill:  2      Follow-up: Return in about 4 months (around 03/22/2023) for a follow-up visit.  Sonda PrimesAlex Freida Nebel, MD

## 2022-11-20 NOTE — Assessment & Plan Note (Signed)
Prostate cancer f/u - PSA q 3 months

## 2022-11-20 NOTE — Assessment & Plan Note (Signed)
Labs were ordered by Dr Myna Hidalgo

## 2022-11-26 ENCOUNTER — Ambulatory Visit (INDEPENDENT_AMBULATORY_CARE_PROVIDER_SITE_OTHER): Payer: BLUE CROSS/BLUE SHIELD | Admitting: Licensed Clinical Social Worker

## 2022-11-26 ENCOUNTER — Encounter: Payer: Self-pay | Admitting: Internal Medicine

## 2022-11-26 DIAGNOSIS — F319 Bipolar disorder, unspecified: Secondary | ICD-10-CM

## 2022-11-28 ENCOUNTER — Inpatient Hospital Stay: Payer: BLUE CROSS/BLUE SHIELD

## 2022-11-28 VITALS — BP 147/99 | HR 73 | Temp 98.1°F | Resp 17

## 2022-11-28 DIAGNOSIS — Z85850 Personal history of malignant neoplasm of thyroid: Secondary | ICD-10-CM | POA: Diagnosis not present

## 2022-11-28 DIAGNOSIS — Z85828 Personal history of other malignant neoplasm of skin: Secondary | ICD-10-CM | POA: Diagnosis not present

## 2022-11-28 DIAGNOSIS — D508 Other iron deficiency anemias: Secondary | ICD-10-CM

## 2022-11-28 DIAGNOSIS — D839 Common variable immunodeficiency, unspecified: Secondary | ICD-10-CM | POA: Diagnosis not present

## 2022-11-28 MED ORDER — SODIUM CHLORIDE 0.9 % IV SOLN
300.0000 mg | Freq: Once | INTRAVENOUS | Status: AC
  Administered 2022-11-28: 300 mg via INTRAVENOUS
  Filled 2022-11-28: qty 300

## 2022-11-28 MED ORDER — METHYLPREDNISOLONE SODIUM SUCC 125 MG IJ SOLR: 125.0000 mg | Freq: Once | INTRAMUSCULAR | Status: DC | PRN

## 2022-11-28 MED ORDER — EPINEPHRINE 0.3 MG/0.3ML IJ SOAJ: 0.3000 mg | Freq: Once | INTRAMUSCULAR | Status: DC | PRN

## 2022-11-28 MED ORDER — DIPHENHYDRAMINE HCL 50 MG/ML IJ SOLN: 50.0000 mg | Freq: Once | INTRAMUSCULAR | Status: DC | PRN

## 2022-11-28 MED ORDER — SODIUM CHLORIDE 0.9 % IV SOLN: Freq: Once | INTRAVENOUS | Status: AC

## 2022-11-28 MED ORDER — SODIUM CHLORIDE 0.9 % IV SOLN: Freq: Once | INTRAVENOUS | Status: DC | PRN

## 2022-11-28 MED ORDER — ALBUTEROL SULFATE HFA 108 (90 BASE) MCG/ACT IN AERS: 2.0000 | INHALATION_SPRAY | Freq: Once | RESPIRATORY_TRACT | Status: DC | PRN

## 2022-11-28 MED ORDER — FAMOTIDINE IN NACL 20-0.9 MG/50ML-% IV SOLN: 20.0000 mg | Freq: Once | INTRAVENOUS | Status: DC | PRN

## 2022-11-28 NOTE — Patient Instructions (Signed)

## 2022-11-29 ENCOUNTER — Encounter: Payer: Self-pay | Admitting: Internal Medicine

## 2022-12-03 ENCOUNTER — Ambulatory Visit (INDEPENDENT_AMBULATORY_CARE_PROVIDER_SITE_OTHER): Payer: BLUE CROSS/BLUE SHIELD | Admitting: Licensed Clinical Social Worker

## 2022-12-03 ENCOUNTER — Encounter (HOSPITAL_COMMUNITY): Payer: Self-pay | Admitting: Licensed Clinical Social Worker

## 2022-12-03 DIAGNOSIS — F319 Bipolar disorder, unspecified: Secondary | ICD-10-CM | POA: Diagnosis not present

## 2022-12-03 NOTE — Progress Notes (Signed)
Virtual virtual Video Note  I connected with Alex [name] on 12/03/2022 at 2pm EST by video-enabled virtual visit. I verified that I am speaking with the correct person using  two identifiers.I discussed the limitations of evaluation and management by telemedicine and the availability of in person appointments. The patient expressed understanding and agreed to proceed.    LOCATION: Patient: Home  Provider: Home office   History of Present Illness:  Pt was referred by Dr. Lolly Mustache for OP therapy for bipolar disorder and anxiety.  Treatment Goal Addressed:  Pt will meet with clinician 1x every 2 weeks for therapy to monitor for progress towards goals and address any barriers to success; Reduce depression from average severity level of 6/10 down to a 4/10 in next 90 days by engaging in 1-2 positive coping skills daily as part of developing self-care routine; Reduce average anxiety level from 7/10 down to 5/10 in next 90 days by utilizing 1-2 relaxation skills/grounding skills per day, such as mindful breathing, progressive muscle relaxation, positive visualizations.  Progress towards Treatment Goal: Progressing   Observations/Objective: Patient presented for today's session on time and was alert, oriented x5, with no evidence or self-report of SI/HI or A/V H.  Patient reported ongoing compliance with medication and denied any use of alcohol or illicit substances.  Clinician inquired about patient's current emotional ratings, as well as any significant changes in thoughts, feelings or behavior since previous session.  Patient reported scores of  6/10 for depression, 6/10 for anxiety, 5/10 for anger/irritability. Pt explored his emotional ratings, which remain high and discussed coping skills for stressors Pt identifies stressors and  allowed pt to explore and express thoughts and feelings associated with recent life situations and external stressors. Pt is having a MRI/CT scan on knee (maybe torn  meniscus). [name] asked open ended questions. Pt is having a lot of visits with children/grandchildren. "I'm beginning to think about my heath/future. [name] showed pt how to compartmentalize his thoughts and feelings. [name] used CBT to assist in identifying positives to assess how her mood has been impacted. Clinician allowed space for patient to further process emotions and clinician provided supportive statements throughout session.    [name] continued recommendations: self care behaviors, positive social engagements, focusing on overall work/home/life balance, and focusing on positive physical and emotional wellness.  Assessment and plan: Counselor will continue to meet with patient to address treatment plan goals. Patient will continue to follow recommendations of providers and implement skills learned in session.    Collaboration of care: Other:   Continue working with providers    Patient/Guardian was advised Release of Information must be obtained prior to any record release in order to collaborate their care with an outside provider. Patient/Guardian was advised if they have not already done so to contact the registration department to sign all necessary forms in order for [place] to release information regarding their care.    Consent: Patient/Guardian gives verbal consent for treatment and assignment of benefits for services provided during this visit. Patient/Guardian expressed understanding and agreed to proceed.     Follow Up Instructions:  I discussed the assessment and treatment plan with the patient. The patient was provided an opportunity to ask questions and all were answered. The patient agreed with the plan and demonstrated an understanding of the instructions.   The patient was advised to call back or seek an in-person evaluation if the symptoms worsen or if the condition fails to improve as anticipated.  I provided 60 minutes  of non-face-to-face time during this  encounter.   Olympia Adelsberger S, LCAS  12/03/22

## 2022-12-04 ENCOUNTER — Other Ambulatory Visit: Payer: Self-pay | Admitting: Cardiology

## 2022-12-04 DIAGNOSIS — I10 Essential (primary) hypertension: Secondary | ICD-10-CM

## 2022-12-10 ENCOUNTER — Ambulatory Visit (INDEPENDENT_AMBULATORY_CARE_PROVIDER_SITE_OTHER): Payer: BLUE CROSS/BLUE SHIELD | Admitting: Licensed Clinical Social Worker

## 2022-12-10 DIAGNOSIS — F319 Bipolar disorder, unspecified: Secondary | ICD-10-CM

## 2022-12-10 DIAGNOSIS — M25562 Pain in left knee: Secondary | ICD-10-CM | POA: Diagnosis not present

## 2022-12-11 ENCOUNTER — Encounter (HOSPITAL_COMMUNITY): Payer: Self-pay | Admitting: Licensed Clinical Social Worker

## 2022-12-11 NOTE — Progress Notes (Signed)
Virtual virtual Video Note  I connected with Alex [name] on 11/14/2022 at 4pm EST by video-enabled virtual visit. I verified that I am speaking with the correct person using  two identifiers.I discussed the limitations of evaluation and management by telemedicine and the availability of in person appointments. The patient expressed understanding and agreed to proceed.    LOCATION: Patient: Home  Provider: Home office   History of Present Illness:  Pt was referred by Dr. Lolly Mustache for OP therapy for bipolar disorder and anxiety.  Treatment Goal Addressed:  Pt will meet with clinician weekly for therapy to monitor for progress towards goals and address any barriers to success; Reduce depression from average severity level of 6/10 down to a 4/10 in next 90 days by engaging in 1-2 positive coping skills daily as part of developing self-care routine; Reduce average anxiety level from 7/10 down to 5/10 in next 90 days by utilizing 1-2 relaxation skills/grounding skills per day, such as mindful breathing, progressive muscle relaxation, positive visualizations.  Progress towards Treatment Goal: Progressing   Observations/Objective: Patient presented for today's session on time and was alert, oriented x5, with no evidence or self-report of SI/HI or A/V H.  Patient reported ongoing compliance with medication and denied any use of alcohol or illicit substances.  Clinician inquired about patient's current emotional ratings, as well as any significant changes in thoughts, feelings or behavior since previous session.  Patient reported scores of  6/10 for depression, 6/10 for anxiety, 5/10 for anger/irritability. Pt explored his emotional ratings, which remain high. Pt identifies stressors and  allowed pt to explore and express thoughts and feelings associated with recent life situations and external stressors. Pt reports that he is continuing to think about his daughter's death, still not receiving the death  certificate, which continues to reopen the wounds of her death. [name] asked open ended questions. Pt still sees counselor at [facility] for prostate cancer and discussed grief with him as well. Pt's external stressors are containing to exacerbate his feelings of being over stressed. Pt has little to no support even after suggestions made by cln. Pt has a long list of health issues including previous and current cancer diagnoses. [name] provided psychoeducation on stress and coping skills for stress.     Assessment and plan: Counselor will continue to meet with patient to address treatment plan goals. Patient will continue to follow recommendations of providers and implement skills learned in session.    Collaboration of care: Other:   Referral to Hospice for group support for parents of children who have overdosed    Patient/Guardian was advised Release of Information must be obtained prior to any record release in order to collaborate their care with an outside provider. Patient/Guardian was advised if they have not already done so to contact the registration department to sign all necessary forms in order for [place] to release information regarding their care.    Consent: Patient/Guardian gives verbal consent for treatment and assignment of benefits for services provided during this visit. Patient/Guardian expressed understanding and agreed to proceed.     Follow Up Instructions:  I discussed the assessment and treatment plan with the patient. The patient was provided an opportunity to ask questions and all were answered. The patient agreed with the plan and demonstrated an understanding of the instructions.   The patient was advised to call back or seek an in-person evaluation if the symptoms worsen or if the condition fails to improve as anticipated.  I provided 60  minutes of non-face-to-face time during this encounter.   Davon Folta S, LCAS

## 2022-12-11 NOTE — Progress Notes (Signed)
Virtual virtual Video Note  I connected with Alex [name] on 11/19/2022 at 2pm EST by video-enabled virtual visit. I verified that I am speaking with the correct person using  two identifiers.I discussed the limitations of evaluation and management by telemedicine and the availability of in person appointments. The patient expressed understanding and agreed to proceed.    LOCATION: Patient: Home  Provider: Home office   History of Present Illness:  Pt was referred by Dr. Lolly Mustache for OP therapy for bipolar disorder and anxiety.  Treatment Goal Addressed:  Pt will meet with clinician 1x every 2 weeks for therapy to monitor for progress towards goals and address any barriers to success; Reduce depression from average severity level of 6/10 down to a 4/10 in next 90 days by engaging in 1-2 positive coping skills daily as part of developing self-care routine; Reduce average anxiety level from 7/10 down to 5/10 in next 90 days by utilizing 1-2 relaxation skills/grounding skills per day, such as mindful breathing, progressive muscle relaxation, positive visualizations.  Progress towards Treatment Goal: Progressing   Observations/Objective: Patient presented for today's session on time and was alert, oriented x5, with no evidence or self-report of SI/HI or A/V H.  Patient reported ongoing compliance with medication and denied any use of alcohol or illicit substances.  Clinician inquired about patient's current emotional ratings, as well as any significant changes in thoughts, feelings or behavior since previous session.  Patient reported scores of  6/10 for depression, 6/10 for anxiety, 5/10 for anger/irritability. Pt explored his emotional ratings, which remain high. Pt identifies stressors and  allowed pt to explore and express thoughts and feelings associated with recent life situations and external stressors. Pt reports that he is continuing to think about his daughter's death, still not receiving the  death certificate, which continues to reopen the wounds of her death. Pt still sees counselor at [facility] for prostate cancer and discussed grief with him as well. Clinician utilized MI OARS to affirm concerns. Clinician challenged thoughts about this. Clinician processed options for communicating his concerns He continues to have has little support from his family and tries to be strong for everyone else. [name] still encourages pt to go to hospice for grief counseling, but cln still provides education on grief. [name] continued recommendations: self care behaviors, positive social engagements, focusing on overall work/home/life balance, and focusing on positive physical and emotional wellness.  Assessment and plan: Counselor will continue to meet with patient to address treatment plan goals. Patient will continue to follow recommendations of providers and implement skills learned in session.    Collaboration of care: Other:   Referral to Hospice for group support for parents of children who have overdosed    Patient/Guardian was advised Release of Information must be obtained prior to any record release in order to collaborate their care with an outside provider. Patient/Guardian was advised if they have not already done so to contact the registration department to sign all necessary forms in order for [place] to release information regarding their care.    Consent: Patient/Guardian gives verbal consent for treatment and assignment of benefits for services provided during this visit. Patient/Guardian expressed understanding and agreed to proceed.     Follow Up Instructions:  I discussed the assessment and treatment plan with the patient. The patient was provided an opportunity to ask questions and all were answered. The patient agreed with the plan and demonstrated an understanding of the instructions.   The patient was advised to call  back or seek an in-person evaluation if the symptoms worsen or if the  condition fails to improve as anticipated.  I provided 60 minutes of non-face-to-face time during this encounter.   Rissa Turley S, LCAS

## 2022-12-12 ENCOUNTER — Encounter (HOSPITAL_COMMUNITY): Payer: Self-pay | Admitting: Licensed Clinical Social Worker

## 2022-12-12 DIAGNOSIS — H35351 Cystoid macular degeneration, right eye: Secondary | ICD-10-CM | POA: Diagnosis not present

## 2022-12-13 DIAGNOSIS — M25562 Pain in left knee: Secondary | ICD-10-CM | POA: Diagnosis not present

## 2022-12-15 ENCOUNTER — Encounter (HOSPITAL_COMMUNITY): Payer: Self-pay | Admitting: Licensed Clinical Social Worker

## 2022-12-15 ENCOUNTER — Other Ambulatory Visit: Payer: Self-pay | Admitting: Nurse Practitioner

## 2022-12-15 NOTE — Progress Notes (Signed)
Virtual virtual Video Note  I connected with Alex [name] on 11/26/2022 at 2pm EST by video-enabled virtual visit. I verified that I am speaking with the correct person using  two identifiers.I discussed the limitations of evaluation and management by telemedicine and the availability of in person appointments. The patient expressed understanding and agreed to proceed.    LOCATION: Patient: Home  Provider: Home office   History of Present Illness:  Pt was referred by Dr. Lolly Mustache for OP therapy for bipolar disorder and anxiety.  Treatment Goal Addressed:  Pt will meet with clinician 1x every 2 weeks for therapy to monitor for progress towards goals and address any barriers to success; Reduce depression from average severity level of 6/10 down to a 4/10 in next 90 days by engaging in 1-2 positive coping skills daily as part of developing self-care routine; Reduce average anxiety level from 7/10 down to 5/10 in next 90 days by utilizing 1-2 relaxation skills/grounding skills per day, such as mindful breathing, progressive muscle relaxation, positive visualizations.  Progress towards Treatment Goal: Progressing   Observations/Objective: Patient presented for today's session on time and was alert, oriented x5, with no evidence or self-report of SI/HI or A/V H.  Patient reported ongoing compliance with medication and denied any use of alcohol or illicit substances.  Clinician inquired about patient's current emotional ratings, as well as any significant changes in thoughts, feelings or behavior since previous session.  Patient reported scores of  6/10 for depression, 6/10 for anxiety, 5/10 for anger/irritability. Pt explored his emotional ratings, which remain high. Pt identifies stressors and  allowed pt to explore and express thoughts and feelings associated with recent life situations and external stressors.Pt reports on his continued stressors: family, lack of family respect, marital issues,  daughter's OD death, continued health issues, finances. [name] and pt reviewed each stressor problem-solving steps to minimize the stress. [name] and pt role played effective communication skills: asking for an appointment for discussion, using "I" statements and listening skills. Pt still sees counselor at [facility] for prostate cancer and discussed grief with him as well.  [name] continued recommendations: self care behaviors, positive social engagements, focusing on overall work/home/life balance, and focusing on positive physical and emotional wellness.  Assessment and plan: Counselor will continue to meet with patient to address treatment plan goals. Patient will continue to follow recommendations of providers and implement skills learned in session.    Collaboration of care: Other:   Referral to Hospice for group support for parents of children who have overdosed    Patient/Guardian was advised Release of Information must be obtained prior to any record release in order to collaborate their care with an outside provider. Patient/Guardian was advised if they have not already done so to contact the registration department to sign all necessary forms in order for [place] to release information regarding their care.    Consent: Patient/Guardian gives verbal consent for treatment and assignment of benefits for services provided during this visit. Patient/Guardian expressed understanding and agreed to proceed.     Follow Up Instructions:  I discussed the assessment and treatment plan with the patient. The patient was provided an opportunity to ask questions and all were answered. The patient agreed with the plan and demonstrated an understanding of the instructions.   The patient was advised to call back or seek an in-person evaluation if the symptoms worsen or if the condition fails to improve as anticipated.  I provided 60 minutes of non-face-to-face time during this encounter.  Genia Perin S, LCAS

## 2022-12-17 ENCOUNTER — Ambulatory Visit (INDEPENDENT_AMBULATORY_CARE_PROVIDER_SITE_OTHER): Payer: BLUE CROSS/BLUE SHIELD | Admitting: Licensed Clinical Social Worker

## 2022-12-17 DIAGNOSIS — F319 Bipolar disorder, unspecified: Secondary | ICD-10-CM

## 2022-12-18 DIAGNOSIS — M25562 Pain in left knee: Secondary | ICD-10-CM | POA: Diagnosis not present

## 2022-12-21 ENCOUNTER — Other Ambulatory Visit (HOSPITAL_COMMUNITY): Payer: Self-pay | Admitting: Psychiatry

## 2022-12-21 DIAGNOSIS — F411 Generalized anxiety disorder: Secondary | ICD-10-CM

## 2022-12-24 ENCOUNTER — Encounter (HOSPITAL_COMMUNITY): Payer: Self-pay | Admitting: Psychiatry

## 2022-12-24 ENCOUNTER — Ambulatory Visit (HOSPITAL_COMMUNITY): Payer: BLUE CROSS/BLUE SHIELD | Admitting: Licensed Clinical Social Worker

## 2022-12-24 ENCOUNTER — Telehealth (HOSPITAL_BASED_OUTPATIENT_CLINIC_OR_DEPARTMENT_OTHER): Payer: BLUE CROSS/BLUE SHIELD | Admitting: Psychiatry

## 2022-12-24 VITALS — Wt 179.0 lb

## 2022-12-24 DIAGNOSIS — F319 Bipolar disorder, unspecified: Secondary | ICD-10-CM | POA: Diagnosis not present

## 2022-12-24 DIAGNOSIS — F411 Generalized anxiety disorder: Secondary | ICD-10-CM | POA: Diagnosis not present

## 2022-12-24 MED ORDER — LAMOTRIGINE ER 300 MG PO TB24
1.0000 | ORAL_TABLET | ORAL | 0 refills | Status: DC
Start: 2022-12-24 — End: 2023-03-18

## 2022-12-24 MED ORDER — ZOLPIDEM TARTRATE 10 MG PO TABS: ORAL_TABLET | ORAL | 1 refills | Status: DC

## 2022-12-24 MED ORDER — LORAZEPAM 0.5 MG PO TABS
ORAL_TABLET | ORAL | 2 refills | Status: DC
Start: 2022-12-24 — End: 2023-03-18

## 2022-12-24 NOTE — Progress Notes (Signed)
[facility] MD Virtual Progress Note   Patient Location: In Car Provider Location: Home  I connect with patient by video and verified that I am speaking with correct person by using two identifiers. I discussed the limitations of evaluation and management by telemedicine and the availability of in person appointments. I also discussed with the patient that there may be a patient responsible charge related to this service. The patient expressed understanding and agreed to proceed.  Alex [name] [id number] 67 y.o.  12/24/2022 2:10 PM  History of Present Illness:  Patient is evaluated by video session.  He is in the car.  He is coming from [facility] to pick up his son who was admitted in the med psych unit after trying to hurt his chest.  His son has a chronic mental illness with numerous hospitalizations.  Patient reported continued to have stress from family issues but he was pleased that his 40th wedding anniversary went well.  His sister helped financially and he was able to go to [place] and had a good time with the wife.  Patient told his children also which ended with $100 each and that helped.  He reported sleep is good with the help of Ambien which he takes half tablet.  He also seeing a therapist that Thea Silversmith and getting 3 counseling at cancer Center at [facility]  Denies any crying spells or any feeling of hopelessness or worthlessness.  He reported had a fall when he was at [facility] and hurt his left knee.  Likely no major injuries but needs some time to recover.  Patient told he started going to [facility] for exercise and hoping once he fully recovered from knee pain he can go more often.  Denies any mania, psychosis, hallucination.  His appetite is okay.  Like to keep his current medication.  Past Psychiatric History: H/O depression, mood swing, anger and Inpatient at [facility] due to suicidal thoughts but no attempt.  No h/o psychosis but h/o poor impulse control.   Tried Klonopin but did not work.  Took Seroquel, Cymbalta and Remeron (increase depression)     Outpatient Encounter Medications as of 12/24/2022  Medication Sig   acetaminophen (TYLENOL) 500 MG tablet Take by mouth.   amLODipine (NORVASC) 5 MG tablet TAKE 1 TABLET (5 MG TOTAL) BY MOUTH DAILY.   aspirin EC 81 MG tablet Take 1 tablet by mouth daily.   atorvastatin (LIPITOR) 40 MG tablet TAKE 1 TABLET BY MOUTH EVERY DAY   AXIRON 30 MG/ACT SOLN Place 2 Act onto the skin daily.   azithromycin (ZITHROMAX) 250 MG tablet TAKE 2 TABLETS BY MOUTH TODAY, THEN TAKE 1 TABLET DAILY FOR 4 DAYS AS DIRECTED   brimonidine (ALPHAGAN) 0.2 % ophthalmic solution SMARTSIG:In Eye(s)   Coenzyme Q10 (CO Q 10 PO) Take 1 capsule by mouth daily.    EPINEPHrine 0.3 mg/0.3 mL IJ SOAJ injection Inject into the muscle.   erythromycin ophthalmic ointment Place into the right eye at bedtime.   Immune Globulin, Human, 4 GM/20ML SOLN Inject into the skin once a week. wednesday   LamoTRIgine 300 MG TB24 24 hour tablet Take 1 tablet (300 mg total) by mouth every morning.   latanoprost (XALATAN) 0.005 % ophthalmic solution 1 DROP EACH EYE AT NIGHT   levothyroxine (SYNTHROID) 150 MCG tablet Take 1 tablet (150 mcg total) by mouth daily before breakfast.   LORazepam (ATIVAN) 0.5 MG tablet Take one tab daily and 2nd if needed for anxiety  Lutein 20 MG CAPS Take 1 each by mouth.   meloxicam (MOBIC) 15 MG tablet TAKE 1 TABLET BY MOUTH EVERY DAY AS NEEDED   methocarbamol (ROBAXIN) 500 MG tablet Take by mouth.   metoprolol succinate (TOPROL-XL) 50 MG 24 hr tablet metoprolol succinate ER 50 mg tablet,extended release 24 hr   Multiple Vitamin (MULTI-VITAMINS) TABS Take by mouth.   OMEGA-3 KRILL OIL PO Take 1 capsule by mouth daily.    omeprazole (PRILOSEC) 20 MG capsule Take 20 mg by mouth every evening.   ondansetron (ZOFRAN-ODT) 4 MG disintegrating tablet DISSOLVE 1 TABLET BY MOUTH EVERY 8 HOURS AS NEEDED FOR NAUSEA FOR UP TO 7 DAYS    oxybutynin (DITROPAN) 5 MG tablet Take by mouth.   phenazopyridine (PYRIDIUM) 200 MG tablet Take by mouth.   polyethylene glycol (MIRALAX / GLYCOLAX) 17 g packet Take by mouth.   senna-docusate (SENOKOT-S) 8.6-50 MG tablet Take by mouth.   sildenafil (REVATIO) 20 MG tablet Take 1 tablet by mouth once daily   sulfamethoxazole-trimethoprim (BACTRIM DS) 800-160 MG tablet Take 1 tablet by mouth every 12 (twelve) hours.   tadalafil (CIALIS) 5 MG tablet Take 5 mg by mouth.   tamsulosin (FLOMAX) 0.4 MG CAPS capsule TAKE 1 CAPSULE BY MOUTH EVERY DAY (Patient taking differently: as needed.)   telmisartan (MICARDIS) 80 MG tablet Take 1 tablet (80 mg total) by mouth daily. Patient needs appointment with provider for future refills.   testosterone (ANDROGEL) 50 MG/5GM (1%) GEL Place onto the skin daily.   timolol (TIMOPTIC) 0.5 % ophthalmic solution timolol maleate 0.5 % eye drops  INSTILL 1 DROP INTO RIGHT EYE TWICE A DAY   traMADol (ULTRAM) 50 MG tablet Take by mouth.   triamcinolone ointment (KENALOG) 0.5 % Apply 1 Application topically 4 (four) times daily. On the lip   zolpidem (AMBIEN) 10 MG tablet TAKE 1/2 (ONE-HALF) TABLET BY MOUTH AT BEDTIME AND  TAKE  THE  OTHER  HALF  IF  NEEDED   No facility-administered encounter medications on file as of 12/24/2022.    Recent Results (from the past 2160 hour(s))  Iron and Iron Binding Capacity ([facility] only)     Status: Abnormal   Collection Time: 11/14/22 12:20 PM  Result Value Ref Range   Iron 103 45 - 182 ug/dL   TIBC 161 (H) 096 - 045 ug/dL   Saturation Ratios 20 17.9 - 39.5 %   UIBC 412 (H) 117 - 376 ug/dL    Comment: Performed at [facility], [address], [place], [name] [address]  Ferritin     Status: Abnormal   Collection Time: 11/14/22 12:20 PM  Result Value Ref Range   Ferritin 14 (L) 24 - 336 ng/mL    Comment: Performed at [facility], [address] [place], [name] [address]  CMP  (Cancer Center only)     Status: Abnormal   Collection Time: 11/14/22 12:20 PM  Result Value Ref Range   Sodium 134 (L) 135 - 145 mmol/L   Potassium 3.6 3.5 - 5.1 mmol/L   Chloride 101 98 - 111 mmol/L   CO2 20 (L) 22 - 32 mmol/L   Glucose, Bld 160 (H) 70 - 99 mg/dL    Comment: Glucose reference range applies only to samples taken after fasting for at least 8 hours.   BUN 24 (H) 8 - 23 mg/dL   Creatinine 8.29 5.62 - 1.24 mg/dL   Calcium 8.1 (L) 8.9 - 10.3 mg/dL   Total  Protein 7.2 6.5 - 8.1 g/dL   Albumin 4.0 3.5 - 5.0 g/dL   AST 29 15 - 41 U/L   ALT 24 0 - 44 U/L   Alkaline Phosphatase 124 38 - 126 U/L   Total Bilirubin 0.9 0.3 - 1.2 mg/dL   GFR, Estimated >16 >10 mL/min    Comment: (NOTE) Calculated using the CKD-EPI Creatinine Equation (2021)    Anion gap 13 5 - 15    Comment: Performed at [facility], [address] [place], [name] [address]  CBC with Differential (Cancer Center Only)     Status: Abnormal   Collection Time: 11/14/22 12:20 PM  Result Value Ref Range   WBC Count 13.9 (H) 4.0 - 10.5 K/uL   RBC 6.26 (H) 4.22 - 5.81 MIL/uL   Hemoglobin 16.1 13.0 - 17.0 g/dL   HCT 40.9 81.1 - 91.4 %   MCV 78.0 (L) 80.0 - 100.0 fL   MCH 25.7 (L) 26.0 - 34.0 pg   MCHC 33.0 30.0 - 36.0 g/dL   RDW 78.2 (H) 95.6 - 21.3 %   Platelet Count 246 150 - 400 K/uL   nRBC 0.0 0.0 - 0.2 %   Neutrophils Relative % 81 %   Neutro Abs 11.2 (H) 1.7 - 7.7 K/uL   Lymphocytes Relative 11 %   Lymphs Abs 1.6 0.7 - 4.0 K/uL   Monocytes Relative 7 %   Monocytes Absolute 0.9 0.1 - 1.0 K/uL   Eosinophils Relative 0 %   Eosinophils Absolute 0.1 0.0 - 0.5 K/uL   Basophils Relative 1 %   Basophils Absolute 0.1 0.0 - 0.1 K/uL   Immature Granulocytes 0 %   Abs Immature Granulocytes 0.06 0.00 - 0.07 K/uL    Comment: Performed at [facility], [address] [place], [name] [address]  Lactate dehydrogenase     Status: None   Collection Time:  11/14/22 12:20 PM  Result Value Ref Range   LDH 185 98 - 192 U/L    Comment: Performed at [facility], [address], [place], [name] [address]  Kappa/lambda light chains     Status: None   Collection Time: 11/14/22 12:20 PM  Result Value Ref Range   Kappa free light chain 12.2 3.3 - 19.4 mg/L   Lambda free light chains 8.9 5.7 - 26.3 mg/L   Kappa, lambda light chain ratio 1.37 0.26 - 1.65    Comment: (NOTE) Performed At: [facility] [address] [place], [name] [id number] Jolene Schimke MD [id number]   IgG, IgA, IgM     Status: None   Collection Time: 11/14/22 12:20 PM  Result Value Ref Range   IgG (Immunoglobin G), Serum 1,062 603 - 1,613 mg/dL   IgA 76 61 - 440 mg/dL   IgM (Immunoglobulin M), Srm 20 20 - 172 mg/dL    Comment: (NOTE) Result confirmed on concentration. Performed At: [facility] [address] [place], [name] [id number] Jolene Schimke MD [id number]      Psychiatric Specialty Exam: Physical Exam  Review of Systems  Musculoskeletal:        Left knee pain    Weight 179 lb (81.2 kg).There is no height or weight on file to calculate BMI.  General Appearance: Casual  Eye Contact:  Fair  Speech:  Slow  Volume:  Normal  Mood:  Anxious and Dysphoric  Affect:  Congruent  Thought Process:  Goal Directed  Orientation:  Full (Time, Place, and Person)  Thought Content:  Rumination  Suicidal Thoughts:  No  Homicidal Thoughts:  No  Memory:  Immediate;   Good Recent;   Fair Remote;   Fair  Judgement:  Intact  Insight:  Present  Psychomotor Activity:  Normal  Concentration:  Concentration: Fair and Attention Span: Fair  Recall:  Good  Fund of Knowledge:  Good  Language:  Good  Akathisia:  No  Handed:  Right  AIMS (if indicated):     Assets:  Communication Skills Desire for Improvement Housing Transportation  ADL's:  Intact  Cognition:  WNL  Sleep:  fair     Assessment/Plan: Bipolar I disorder (HCC)  - Plan: LamoTRIgine 300 MG TB24 24 hour tablet, LORazepam (ATIVAN) 0.5 MG tablet  GAD (generalized anxiety disorder) - Plan: zolpidem (AMBIEN) 10 MG tablet, LamoTRIgine 300 MG TB24 24 hour tablet, LORazepam (ATIVAN) 0.5 MG tablet  Patient is stable on his medication.  He has chronic symptoms but manageable.  He has no side effects from the medication.  He [facility] and the rest of the medication to [facility].  So far he has no rash, itching tremors or shakes.  Continue Ativan 1 to 2 tablet as needed for anxiety, Lamictal 300 mg daily and Ambien 5 mg tablet at bedtime sometimes he need extra 5.  Encouraged to continue therapy with Malen Gauze.  Recommend to call us back with any question or any concern.  Follow up in 3 months.   Follow Up Instructions:     I discussed the assessment and treatment plan with the patient. The patient was provided an opportunity to ask questions and all were answered. The patient agreed with the plan and demonstrated an understanding of the instructions.   The patient was advised to call back or seek an in-person evaluation if the symptoms worsen or if the condition fails to improve as anticipated.    Collaboration of Care: Other provider involved in patient's care AEB notes are available in epic to review  Patient/Guardian was advised Release of Information must be obtained prior to any record release in order to collaborate their care with an outside provider. Patient/Guardian was advised if they have not already done so to contact the registration department to sign all necessary forms in order for [place] to release information regarding their care.   Consent: Patient/Guardian gives verbal consent for treatment and assignment of benefits for services provided during this visit. Patient/Guardian expressed understanding and agreed to proceed.     I provided 18 minutes of non face to face time during this encounter.  Note: This document  was prepared by [employer] voice dictation technology and any errors that results from this process are unintentional.    Cleotis Nipper, MD 12/24/2022

## 2022-12-25 ENCOUNTER — Encounter (HOSPITAL_COMMUNITY): Payer: Self-pay | Admitting: Licensed Clinical Social Worker

## 2022-12-25 ENCOUNTER — Ambulatory Visit (INDEPENDENT_AMBULATORY_CARE_PROVIDER_SITE_OTHER): Payer: BLUE CROSS/BLUE SHIELD | Admitting: Licensed Clinical Social Worker

## 2022-12-25 DIAGNOSIS — F319 Bipolar disorder, unspecified: Secondary | ICD-10-CM | POA: Diagnosis not present

## 2022-12-25 NOTE — Progress Notes (Signed)
Virtual virtual Video Note  I connected with Alex [name] on 12/10/2022 at 11am EST by video-enabled virtual visit. I verified that I am speaking with the correct person using  two identifiers.I discussed the limitations of evaluation and management by telemedicine and the availability of in person appointments. The patient expressed understanding and agreed to proceed.    LOCATION: Patient: Home  Provider: Home office   History of Present Illness:  Pt was referred by Dr. Lolly Mustache for OP therapy for bipolar disorder and anxiety.  Treatment Goal Addressed:  Pt will meet with clinician 1x every 2 weeks for therapy to monitor for progress towards goals and address any barriers to success; Reduce depression from average severity level of 6/10 down to a 4/10 in next 90 days by engaging in 1-2 positive coping skills daily as part of developing self-care routine; Reduce average anxiety level from 7/10 down to 5/10 in next 90 days by utilizing 1-2 relaxation skills/grounding skills per day, such as mindful breathing, progressive muscle relaxation, positive visualizations.  Progress towards Treatment Goal: Progressing   Observations/Objective: Patient presented for today's session on time and was alert, oriented x5, with no evidence or self-report of SI/HI or A/V H.  Patient reported ongoing compliance with medication and denied any use of alcohol or illicit substances.  Clinician inquired about patient's current emotional ratings, as well as any significant changes in thoughts, feelings or behavior since previous session.  Patient reported scores of  6/10 for depression, 6/10 for anxiety, 5/10 for anger/irritability. Pt explored his emotional ratings, which remain high and discussed coping skills for stressors Pt identifies stressors and  allowed pt to explore and express thoughts and feelings associated with recent life situations and external stressors. [name] and pt reviewed his continued stressors  individually:  family and marital issues, son's mental health, physical health, future. [name] used CBT to assist in identifying positives to assess how her mood has been impacted. Clinician allowed space for patient to further process emotions and clinician provided supportive statements throughout session.    Assessment and plan: Counselor will continue to meet with patient to address treatment plan goals. Patient will continue to follow recommendations of providers and implement skills learned in session.    Collaboration of care: Other:   Continue working with providers    Patient/Guardian was advised Release of Information must be obtained prior to any record release in order to collaborate their care with an outside provider. Patient/Guardian was advised if they have not already done so to contact the registration department to sign all necessary forms in order for [place] to release information regarding their care.    Consent: Patient/Guardian gives verbal consent for treatment and assignment of benefits for services provided during this visit. Patient/Guardian expressed understanding and agreed to proceed.     Follow Up Instructions:  I discussed the assessment and treatment plan with the patient. The patient was provided an opportunity to ask questions and all were answered. The patient agreed with the plan and demonstrated an understanding of the instructions.   The patient was advised to call back or seek an in-person evaluation if the symptoms worsen or if the condition fails to improve as anticipated.  I provided 60 minutes of non-face-to-face time during this encounter.   Sankalp Ferrell S, LCAS  12/10/22

## 2022-12-28 NOTE — Progress Notes (Signed)
Virtual virtual Video Note  I connected with Alex [name] on 10/15/2022 at 3pm EST by video-enabled virtual visit. I verified that I am speaking with the correct person using  two identifiers.I discussed the limitations of evaluation and management by telemedicine and the availability of in person appointments. The patient expressed understanding and agreed to proceed.    LOCATION: Patient: Home  Provider: Home office   History of Present Illness:  Pt was referred by Dr. Lolly Mustache for OP therapy for bipolar disorder and anxiety.  Treatment Goal Addressed:  Pt will meet with clinician 1x every 2 weeks for therapy to monitor for progress towards goals and address any barriers to success; Reduce depression from average severity level of 6/10 down to a 4/10 in next 90 days by engaging in 1-2 positive coping skills daily as part of developing self-care routine; Reduce average anxiety level from 7/10 down to 5/10 in next 90 days by utilizing 1-2 relaxation skills/grounding skills per day, such as mindful breathing, progressive muscle relaxation, positive visualizations.  Progress towards Treatment Goal: Progressing   Observations/Objective: Patient presented for today's session on time and was alert, oriented x5, with no evidence or self-report of SI/HI or A/V H.  Patient reported ongoing compliance with medication and denied any use of alcohol or illicit substances.  Clinician inquired about patient's current emotional ratings, as well as any significant changes in thoughts, feelings or behavior since previous session.  Patient reported scores of  6/10 for depression, 6/10 for anxiety, 5/10 for anger/irritability. [name] and Pt explored his emotional ratings, which remain high. Pt identifies stressors and  allowed pt to explore and express thoughts and feelings associated with recent life situations and external stressors.  Pt is still in the grieving process. [name] explained the grieving process is not linear  and he may go back/forth in his grief. Pt Continues to  sees counselor at [facility] for cancer diagnosis and discusses grief as well. Clinician engaged in discussion on identified positives and utilized MI, OARS to assess how these have impacted client's mood. [name] had pt identify the positives in his life because he believes he has so many negatives. Clinician allowed space for further processing of emotions and provided supportive statements throughout session.    Assessment and plan: Counselor will continue to meet with patient to address treatment plan goals. Patient will continue to follow recommendations of providers and implement skills learned in session.    Collaboration of care: Other:   Referral to Hospice for group support for parents of children who have overdosed    Patient/Guardian was advised Release of Information must be obtained prior to any record release in order to collaborate their care with an outside provider. Patient/Guardian was advised if they have not already done so to contact the registration department to sign all necessary forms in order for [place] to release information regarding their care.    Consent: Patient/Guardian gives verbal consent for treatment and assignment of benefits for services provided during this visit. Patient/Guardian expressed understanding and agreed to proceed.     Follow Up Instructions:  I discussed the assessment and treatment plan with the patient. The patient was provided an opportunity to ask questions and all were answered. The patient agreed with the plan and demonstrated an understanding of the instructions.   The patient was advised to call back or seek an in-person evaluation if the symptoms worsen or if the condition fails to improve as anticipated.  I provided 60 minutes of non-face-to-face  time during this encounter.   Travis Mastel S, LCAS

## 2022-12-30 DIAGNOSIS — H3589 Other specified retinal disorders: Secondary | ICD-10-CM | POA: Diagnosis not present

## 2022-12-30 DIAGNOSIS — H4051X3 Glaucoma secondary to other eye disorders, right eye, severe stage: Secondary | ICD-10-CM | POA: Diagnosis not present

## 2022-12-30 DIAGNOSIS — H401121 Primary open-angle glaucoma, left eye, mild stage: Secondary | ICD-10-CM | POA: Diagnosis not present

## 2022-12-30 DIAGNOSIS — H25812 Combined forms of age-related cataract, left eye: Secondary | ICD-10-CM | POA: Diagnosis not present

## 2022-12-31 ENCOUNTER — Ambulatory Visit (INDEPENDENT_AMBULATORY_CARE_PROVIDER_SITE_OTHER): Payer: BLUE CROSS/BLUE SHIELD | Admitting: Licensed Clinical Social Worker

## 2022-12-31 ENCOUNTER — Encounter (HOSPITAL_COMMUNITY): Payer: Self-pay | Admitting: Licensed Clinical Social Worker

## 2022-12-31 DIAGNOSIS — F319 Bipolar disorder, unspecified: Secondary | ICD-10-CM

## 2022-12-31 NOTE — Progress Notes (Signed)
Virtual virtual Video Note  I connected with Alex [name] on 12/31/2022 at 11am EST by video-enabled virtual visit. I verified that I am speaking with the correct person using  two identifiers.I discussed the limitations of evaluation and management by telemedicine and the availability of in person appointments. The patient expressed understanding and agreed to proceed.    LOCATION: Patient: Home  Provider: Home office   History of Present Illness:  Pt was referred by Dr. Lolly Mustache for OP therapy for bipolar disorder and anxiety.  Treatment Goal Addressed:  Pt will meet with clinician 1x every 2 weeks for therapy to monitor for progress towards goals and address any barriers to success; Reduce depression from average severity level of 6/10 down to a 4/10 in next 90 days by engaging in 1-2 positive coping skills daily as part of developing self-care routine; Reduce average anxiety level from 7/10 down to 5/10 in next 90 days by utilizing 1-2 relaxation skills/grounding skills per day, such as mindful breathing, progressive muscle relaxation, positive visualizations.  Progress towards Treatment Goal: Progressing   Observations/Objective: Patient presented for today's session on time and was alert, oriented x5, with no evidence or self-report of SI/HI or A/V H.  Patient reported ongoing compliance with medication and denied any use of alcohol or illicit substances.  Clinician inquired about patient's current emotional ratings, as well as any significant changes in thoughts, feelings or behavior since previous session.  Patient reported scores of  6/10 for depression, 6/10 for anxiety, 5/10 for anger/irritability. Pt explored his emotional ratings, which remain high and discussed coping skills for stressors Pt identifies stressors and  allowed pt to explore and express thoughts and feelings associated with recent life situations and external stressors. [name] and pt reviewed his continued stressors  individually:  family and marital issues, son's mental health, physical health, future. Clinician provided thought stopping tools, as well as reality testing to provide support for patient. Clinician utilized CBT to address thought processes support and confidence in his decisions.     Assessment and plan: Counselor will continue to meet with patient to address treatment plan goals. Patient will continue to follow recommendations of providers and implement skills learned in session.    Collaboration of care: Other:   Continue working with providers    Patient/Guardian was advised Release of Information must be obtained prior to any record release in order to collaborate their care with an outside provider. Patient/Guardian was advised if they have not already done so to contact the registration department to sign all necessary forms in order for [place] to release information regarding their care.    Consent: Patient/Guardian gives verbal consent for treatment and assignment of benefits for services provided during this visit. Patient/Guardian expressed understanding and agreed to proceed.     Follow Up Instructions:  I discussed the assessment and treatment plan with the patient. The patient was provided an opportunity to ask questions and all were answered. The patient agreed with the plan and demonstrated an understanding of the instructions.   The patient was advised to call back or seek an in-person evaluation if the symptoms worsen or if the condition fails to improve as anticipated.  I provided 60 minutes of non-face-to-face time during this encounter.   Jasiyah Paulding S, LCAS  12/31/22

## 2023-01-01 ENCOUNTER — Ambulatory Visit (HOSPITAL_COMMUNITY): Payer: BLUE CROSS/BLUE SHIELD | Admitting: Licensed Clinical Social Worker

## 2023-01-02 ENCOUNTER — Telehealth: Payer: Self-pay

## 2023-01-02 NOTE — Telephone Encounter (Signed)
Received phone call from patient stating that for the past 3 weeks he has had lower abdominal pain described as "gassy bowels, achy and intermittent" Pt states he notices the pain worse after eating. Pt states he called today because its just "annoying" Pt states he called his GI doctor but unable to get an appointment until end of June. Pt states he has called [facility] where he is followed for his prostate cancer diagnosis and they told him to follow up with a GI MD and then go from there. Pt educated that our recommendation would be to go to an ER to have it evaluated or get in with his GI MD that he is established with. Pt aware that this office is unable to work up the symptoms described. Pt verbalized understanding and stated he would have to weigh the cost of going to an urgent care or waiting for a GI appointment. Pt also requested that his lab orders be mailed to him prior to his next appointment so that he can have them drawn at [facility].

## 2023-01-05 ENCOUNTER — Encounter (HOSPITAL_COMMUNITY): Payer: Self-pay | Admitting: Licensed Clinical Social Worker

## 2023-01-05 NOTE — Progress Notes (Signed)
Virtual virtual Video Note  I connected with Alex [name] on 12/17/2022 at 11am-12:00pm EST by video-enabled virtual visit. I verified that I am speaking with the correct person using  two identifiers.I discussed the limitations of evaluation and management by telemedicine and the availability of in person appointments. The patient expressed understanding and agreed to proceed.    LOCATION: Patient: Home  Provider: Home office   History of Present Illness:  Pt was referred by Dr. Lolly [name] for OP therapy for bipolar disorder and anxiety.  Treatment Goal Addressed:  Pt will meet with clinician weekly for therapy to monitor for progress towards goals and address any barriers to success; Reduce depression from average severity level of 6/10 down to a 4/10 in next 90 days by engaging in 1-2 positive coping skills daily as part of developing self-care routine; Reduce average anxiety level from 7/10 down to 5/10 in next 90 days by utilizing 1-2 relaxation skills/grounding skills per day, such as mindful breathing, progressive muscle relaxation, positive visualizations.  Progress towards Treatment Goal: Progressing   Observations/Objective: Patient presented for today's session on time and was alert, oriented x5, with no evidence or self-report of SI/HI or A/V H.  Patient reported ongoing compliance with medication and denied any use of alcohol or illicit substances.  Clinician inquired about patient's current emotional ratings, as well as any significant changes in thoughts, feelings or behavior since previous session.  Patient reported scores of  6/10 for depression, 6/10 for anxiety, 5/10 for anger/irritability. Pt explored his emotional ratings, which remain high and discussed coping skills for stressors Pt identifies stressors and  allowed pt to explore and express thoughts and feelings associated with recent life situations and external stressors. Again, [name] and pt reviewed his continued stressors  individually:  family and marital issues, son's mental health, physical health, future. [name] explored negative cycle of anxiety including ruminative  thoughts and avoidant behaviors and discussed negative (longer-term)  consequences of avoidance. Pt practiced confronting avoidance w/problem solving  skill (DBT Emotion Regulation skill).     Assessment and plan: Counselor will continue to meet with patient to address treatment plan goals. Patient will continue to follow recommendations of providers and implement skills learned in session.    Collaboration of care: Other:   Continue working with providers    Patient/Guardian was advised Release of Information must be obtained prior to any record release in order to collaborate their care with an outside provider. Patient/Guardian was advised if they have not already done so to contact the registration department to sign all necessary forms in order for [place] to release information regarding their care.    Consent: Patient/Guardian gives verbal consent for treatment and assignment of benefits for services provided during this visit. Patient/Guardian expressed understanding and agreed to proceed.     Follow Up Instructions:  I discussed the assessment and treatment plan with the patient. The patient was provided an opportunity to ask questions and all were answered. The patient agreed with the plan and demonstrated an understanding of the instructions.   The patient was advised to call back or seek an in-person evaluation if the symptoms worsen or if the condition fails to improve as anticipated.  I provided 60 minutes of non-face-to-face time during this encounter.   Alex [name] S, LCAS  12/17/22

## 2023-01-08 ENCOUNTER — Ambulatory Visit (HOSPITAL_COMMUNITY): Payer: BLUE CROSS/BLUE SHIELD | Admitting: Licensed Clinical Social Worker

## 2023-01-09 ENCOUNTER — Encounter (HOSPITAL_COMMUNITY): Payer: Self-pay | Admitting: Licensed Clinical Social Worker

## 2023-01-09 ENCOUNTER — Ambulatory Visit (HOSPITAL_COMMUNITY): Payer: BLUE CROSS/BLUE SHIELD | Admitting: Licensed Clinical Social Worker

## 2023-01-09 NOTE — Progress Notes (Signed)
Virtual virtual Video Note  I connected with Alex [name] on 01/09/2023 at 3:00-4:00pm EST by video-enabled virtual visit. I verified that I am speaking with the correct person using  two identifiers.I discussed the limitations of evaluation and management by telemedicine and the availability of in person appointments. The patient expressed understanding and agreed to proceed.    LOCATION: Patient: Home  Provider: Home office   History of Present Illness:  Pt was referred by Dr. Lolly Mustache for OP therapy for bipolar disorder and anxiety.  Treatment Goal Addressed:  Pt will meet with clinician weekly for therapy to monitor for progress towards goals and address any barriers to success; Reduce depression from average severity level of 6/10 down to a 4/10 in next 90 days by engaging in 1-2 positive coping skills daily as part of developing self-care routine; Reduce average anxiety level from 7/10 down to 5/10 in next 90 days by utilizing 1-2 relaxation skills/grounding skills per day, such as mindful breathing, progressive muscle relaxation, positive visualizations.  Progress towards Treatment Goal: Progressing   Observations/Objective: Patient presented for today's session on time and was alert, oriented x5, with no evidence or self-report of SI/HI or A/V H.  Patient reported ongoing compliance with medication and denied any use of alcohol or illicit substances.  Clinician inquired about patient's current emotional ratings, as well as any significant changes in thoughts, feelings or behavior since previous session.  Patient reported scores of  6/10 for depression, 6/10 for anxiety, 5/10 for anger/irritability. Pt explored his emotional ratings, which remain high and discussed coping skills for stressors: family and marital issues, son's mental health, physical health, cancer diagnosis, future. [name] used CBT to assist in identifying negatives to assess how his mood has been impacted. Clinician allowed  space for patient to further process emotions and clinician provided supportive statements throughout session. It was suggested to patient to continue to identify positives that  impact mood daily. [name] provided education on how to to recognize the physical, cognitive, emotional, and behavioral responses that influence his mood.      Assessment and plan: Counselor will continue to meet with patient to address treatment plan goals. Patient will continue to follow recommendations of providers and implement skills learned in session.    Collaboration of care: Other:   Continue working with providers    Patient/Guardian was advised Release of Information must be obtained prior to any record release in order to collaborate their care with an outside provider. Patient/Guardian was advised if they have not already done so to contact the registration department to sign all necessary forms in order for [place] to release information regarding their care.    Consent: Patient/Guardian gives verbal consent for treatment and assignment of benefits for services provided during this visit. Patient/Guardian expressed understanding and agreed to proceed.     Follow Up Instructions:  I discussed the assessment and treatment plan with the patient. The patient was provided an opportunity to ask questions and all were answered. The patient agreed with the plan and demonstrated an understanding of the instructions.   The patient was advised to call back or seek an in-person evaluation if the symptoms worsen or if the condition fails to improve as anticipated.  I provided 60 minutes of non-face-to-face time during this encounter.   Jennae Hakeem S, LCAS  12/25/22

## 2023-01-15 ENCOUNTER — Ambulatory Visit (INDEPENDENT_AMBULATORY_CARE_PROVIDER_SITE_OTHER): Payer: BLUE CROSS/BLUE SHIELD | Admitting: Licensed Clinical Social Worker

## 2023-01-15 ENCOUNTER — Encounter (HOSPITAL_COMMUNITY): Payer: Self-pay | Admitting: Licensed Clinical Social Worker

## 2023-01-15 DIAGNOSIS — F319 Bipolar disorder, unspecified: Secondary | ICD-10-CM

## 2023-01-15 NOTE — Progress Notes (Signed)
Virtual virtual Video Note  I connected with Othor Vines Castelluccio on 01/15/2023 at 2:00-3:00pm EST by video-enabled virtual visit. I verified that I am speaking with the correct person using  two identifiers.I discussed the limitations of evaluation and management by telemedicine and the availability of in person appointments. The patient expressed understanding and agreed to proceed.    LOCATION: Patient: Home  Provider: Home office   History of Present Illness:  Pt was referred by Dr. Lolly Mustache for OP therapy for bipolar disorder and anxiety.  Treatment Goal Addressed:  Pt will meet with clinician 1x every 2 weeks for therapy to monitor for progress towards goals and address any barriers to success; Reduce depression from average severity level of 6/10 down to a 4/10 in next 90 days by engaging in 1-2 positive coping skills daily as part of developing self-care routine; Reduce average anxiety level from 7/10 down to 5/10 in next 90 days by utilizing 1-2 relaxation skills/grounding skills per day, such as mindful breathing, progressive muscle relaxation, positive visualizations.  Progress towards Treatment Goal: Progressing   Observations/Objective: Patient presented for today's session on time and was alert, oriented x5, with no evidence or self-report of SI/HI or A/V H.  Patient reported ongoing compliance with medication and denied any use of alcohol or illicit substances.  Clinician inquired about patient's current emotional ratings, as well as any significant changes in thoughts, feelings or behavior since previous session.  Patient reported scores of  6/10 for depression, 6/10 for anxiety, 5/10 for anger/irritability. Pt explored his emotional ratings, which remain high and discussed coping skills for stressors Pt identifies stressors and  allowed pt to explore and express thoughts and feelings associated with recent life situations and external stressors. [name] and pt reviewed his continued stressors  individually: and marital issues, son's mental health, physical health, future, pt's sexual health, after prostate cancer. Pt engaged in a discussion on identified positives in his life and how they impact patient's moods. Clinician allowed space for further processing of emotions and provided supportive statements throughout the session. Patient was encouraged to identify positives in his life to improve moods.      Assessment and plan: Counselor will continue to meet with patient to address treatment plan goals. Patient will continue to follow recommendations of providers and implement skills learned in session.    Collaboration of care: Other:   Continue working with providers    Patient/Guardian was advised Release of Information must be obtained prior to any record release in order to collaborate their care with an outside provider. Patient/Guardian was advised if they have not already done so to contact the registration department to sign all necessary forms in order for [place] to release information regarding their care.    Consent: Patient/Guardian gives verbal consent for treatment and assignment of benefits for services provided during this visit. Patient/Guardian expressed understanding and agreed to proceed.     Follow Up Instructions:  I discussed the assessment and treatment plan with the patient. The patient was provided an opportunity to ask questions and all were answered. The patient agreed with the plan and demonstrated an understanding of the instructions.   The patient was advised to call back or seek an in-person evaluation if the symptoms worsen or if the condition fails to improve as anticipated.  I provided 60 minutes of non-face-to-face time during this encounter.   Islay Polanco S, LCAS  01/15/23

## 2023-01-16 ENCOUNTER — Other Ambulatory Visit: Payer: Self-pay | Admitting: Cardiology

## 2023-01-16 DIAGNOSIS — H35351 Cystoid macular degeneration, right eye: Secondary | ICD-10-CM | POA: Diagnosis not present

## 2023-01-16 DIAGNOSIS — I2510 Atherosclerotic heart disease of native coronary artery without angina pectoris: Secondary | ICD-10-CM

## 2023-01-16 DIAGNOSIS — E7800 Pure hypercholesterolemia, unspecified: Secondary | ICD-10-CM

## 2023-01-22 ENCOUNTER — Encounter (HOSPITAL_COMMUNITY): Payer: Self-pay | Admitting: Licensed Clinical Social Worker

## 2023-01-22 ENCOUNTER — Ambulatory Visit (HOSPITAL_COMMUNITY): Payer: BLUE CROSS/BLUE SHIELD | Admitting: Licensed Clinical Social Worker

## 2023-01-22 ENCOUNTER — Ambulatory Visit (INDEPENDENT_AMBULATORY_CARE_PROVIDER_SITE_OTHER): Payer: BLUE CROSS/BLUE SHIELD | Admitting: Licensed Clinical Social Worker

## 2023-01-22 ENCOUNTER — Other Ambulatory Visit: Payer: Self-pay | Admitting: Gastroenterology

## 2023-01-22 DIAGNOSIS — R1310 Dysphagia, unspecified: Secondary | ICD-10-CM

## 2023-01-22 DIAGNOSIS — F319 Bipolar disorder, unspecified: Secondary | ICD-10-CM

## 2023-01-22 NOTE — Progress Notes (Signed)
Virtual virtual Video Note  I connected with Alex [name] on 01/22/2023 at 11:00-12:00pm EST by video-enabled virtual visit. I verified that I am speaking with the correct person using  two identifiers.I discussed the limitations of evaluation and management by telemedicine and the availability of in person appointments. The patient expressed understanding and agreed to proceed.    LOCATION: Patient: Home  Provider: Home office   History of Present Illness:  Pt was referred by Dr. Lolly Mustache for OP therapy for bipolar disorder and anxiety.  Treatment Goal Addressed:  Pt will meet with clinician 1x every 2 weeks for therapy to monitor for progress towards goals and address any barriers to success; Reduce depression from average severity level of 6/10 down to a 4/10 in next 90 days by engaging in 1-2 positive coping skills daily as part of developing self-care routine; Reduce average anxiety level from 7/10 down to 5/10 in next 90 days by utilizing 1-2 relaxation skills/grounding skills per day, such as mindful breathing, progressive muscle relaxation, positive visualizations.  Progress towards Treatment Goal: Progressing   Observations/Objective: Patient presented for today's session on time and was alert, oriented x5, with no evidence or self-report of SI/HI or A/V H.  Patient reported ongoing compliance with medication and denied any use of alcohol or illicit substances.  Clinician inquired about patient's current emotional ratings, as well as any significant changes in thoughts, feelings or behavior since previous session.  Patient reported scores of  6/10 for depression, 6/10 for anxiety, 5/10 for anger/irritability. Pt explored his emotional ratings, which remain high and discussed coping skills for stressors Pt identifies stressors and  allowed pt to explore and express thoughts and feelings associated with recent life situations and external stressors. [name] and pt reviewed his continued stressors  individually: and marital issues, son's mental health, physical health, future, pt's sexual health, after prostate cancer.  Clinician utilized CBT to reflect and summarize thoughts and feelings. Clinician explored triggers and coping skills in order to assist in feeling better. Clinician processed triggers and their effects on his mood and behaviors    Pt engaged in a discussion on identified positives in his life and how they impact patient's moods. Clinician allowed space for further processing of emotions and provided supportive statements throughout the session. Patient was encouraged to identify positives in his life to improve moods.      Assessment and plan: Counselor will continue to meet with patient to address treatment plan goals. Patient will continue to follow recommendations of providers and implement skills learned in session.    Collaboration of care: Other:   Continue working with providers    Patient/Guardian was advised Release of Information must be obtained prior to any record release in order to collaborate their care with an outside provider. Patient/Guardian was advised if they have not already done so to contact the registration department to sign all necessary forms in order for [place] to release information regarding their care.    Consent: Patient/Guardian gives verbal consent for treatment and assignment of benefits for services provided during this visit. Patient/Guardian expressed understanding and agreed to proceed.     Follow Up Instructions:  I discussed the assessment and treatment plan with the patient. The patient was provided an opportunity to ask questions and all were answered. The patient agreed with the plan and demonstrated an understanding of the instructions.   The patient was advised to call back or seek an in-person evaluation if the symptoms worsen or if the condition  fails to improve as anticipated.  I provided 60 minutes of non-face-to-face  time during this encounter.   Jazzmon Prindle S, LCAS  01/22/23

## 2023-01-29 ENCOUNTER — Ambulatory Visit
Admission: RE | Admit: 2023-01-29 | Discharge: 2023-01-29 | Disposition: A | Discharge status: Home / Self Care | Payer: BLUE CROSS/BLUE SHIELD | Source: Ambulatory Visit | Attending: Gastroenterology | Admitting: Gastroenterology

## 2023-01-29 ENCOUNTER — Ambulatory Visit (HOSPITAL_COMMUNITY): Payer: BLUE CROSS/BLUE SHIELD | Admitting: Licensed Clinical Social Worker

## 2023-01-29 ENCOUNTER — Encounter: Payer: Self-pay | Admitting: Family

## 2023-01-29 DIAGNOSIS — R1310 Dysphagia, unspecified: Secondary | ICD-10-CM

## 2023-01-30 ENCOUNTER — Encounter (HOSPITAL_COMMUNITY): Payer: Self-pay | Admitting: Licensed Clinical Social Worker

## 2023-01-30 ENCOUNTER — Ambulatory Visit (INDEPENDENT_AMBULATORY_CARE_PROVIDER_SITE_OTHER): Payer: BLUE CROSS/BLUE SHIELD | Admitting: Licensed Clinical Social Worker

## 2023-01-30 DIAGNOSIS — F319 Bipolar disorder, unspecified: Secondary | ICD-10-CM
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
## 2023-02-04 DIAGNOSIS — D803 Selective deficiency of immunoglobulin G [IgG] subclasses: Secondary | ICD-10-CM | POA: Diagnosis not present

## 2023-02-04 DIAGNOSIS — N50811 Right testicular pain: Secondary | ICD-10-CM | POA: Diagnosis not present

## 2023-02-04 DIAGNOSIS — Z8589 Personal history of malignant neoplasm of other organs and systems: Secondary | ICD-10-CM | POA: Diagnosis not present

## 2023-02-04 DIAGNOSIS — D839 Common variable immunodeficiency, unspecified: Secondary | ICD-10-CM | POA: Diagnosis not present

## 2023-02-04 DIAGNOSIS — C61 Malignant neoplasm of prostate: Secondary | ICD-10-CM | POA: Diagnosis not present

## 2023-02-04 DIAGNOSIS — R39198 Other difficulties with micturition: Secondary | ICD-10-CM | POA: Diagnosis not present

## 2023-02-04 DIAGNOSIS — Z8546 Personal history of malignant neoplasm of prostate: Secondary | ICD-10-CM | POA: Diagnosis not present

## 2023-02-05 ENCOUNTER — Ambulatory Visit (INDEPENDENT_AMBULATORY_CARE_PROVIDER_SITE_OTHER): Payer: BLUE CROSS/BLUE SHIELD | Admitting: Licensed Clinical Social Worker

## 2023-02-05 ENCOUNTER — Encounter (HOSPITAL_COMMUNITY): Payer: Self-pay | Admitting: Licensed Clinical Social Worker

## 2023-02-05 DIAGNOSIS — F319 Bipolar disorder, unspecified: Secondary | ICD-10-CM | POA: Diagnosis not present

## 2023-02-05 NOTE — Progress Notes (Signed)
Virtual virtual Video Note  I connected with Alex [name] on 02/05/2023 at 2:00-3:00 PM EST by video-enabled virtual visit. I verified that I am speaking with the correct person using  two identifiers.I discussed the limitations of evaluation and management by telemedicine and the availability of in person appointments. The patient expressed understanding and agreed to proceed.    LOCATION: Patient: Home  Provider: Home office   History of Present Illness:  Pt was referred by Dr. Lolly [name] for OP therapy for bipolar disorder and anxiety.  Treatment Goal Addressed:  Pt will meet with clinician 1x every 2 weeks for therapy to monitor for progress towards goals and address any barriers to success; Reduce depression from average severity level of 6/10 down to a 4/10 in next 90 days by engaging in 1-2 positive coping skills daily as part of developing self-care routine; Reduce average anxiety level from 7/10 down to 5/10 in next 90 days by utilizing 1-2 relaxation skills/grounding skills per day, such as mindful breathing, progressive muscle relaxation, positive visualizations.  Progress towards Treatment Goal: Progressing   Observations/Objective: Patient presented for today's session on time and was alert, oriented x5, with no evidence or self-report of SI/HI or A/V H.  Patient reported ongoing compliance with medication and denied any use of alcohol or illicit substances.  Clinician inquired about patient's current emotional ratings, as well as any significant changes in thoughts, feelings or behavior since previous session.  Patient reported scores of  6/10 for depression, 6/10 for anxiety, 5/10 for anger/irritability. Pt explored his emotional ratings, which remain high and discussed coping skills for stressors Pt identifies stressors and  allowed pt to explore and express thoughts and feelings associated with recent life situations and external stressors. [name] and pt reviewed his continued stressors  individually: and marital issues, son's mental health, physical health, future, pt's sexual health, after prostate cancer.  Explored negative cycle of anxiety including ruminative thoughts and avoidant behaviors and discussed negative (longer-term) consequences of avoidance.         Assessment and plan: Counselor will continue to meet with patient to address treatment plan goals. Patient will continue to follow recommendations of providers and implement skills learned in session.    Collaboration of care: Other:   Continue working with providers    Patient/Guardian was advised Release of Information must be obtained prior to any record release in order to collaborate their care with an outside provider. Patient/Guardian was advised if they have not already done so to contact the registration department to sign all necessary forms in order for [place] to release information regarding their care.    Consent: Patient/Guardian gives verbal consent for treatment and assignment of benefits for services provided during this visit. Patient/Guardian expressed understanding and agreed to proceed.     Follow Up Instructions:  I discussed the assessment and treatment plan with the patient. The patient was provided an opportunity to ask questions and all were answered. The patient agreed with the plan and demonstrated an understanding of the instructions.   The patient was advised to call back or seek an in-person evaluation if the symptoms worsen or if the condition fails to improve as anticipated.  I provided 60 minutes of non-face-to-face time during this encounter.   Alex [name] S, LCAS  02/05/23

## 2023-02-06 ENCOUNTER — Other Ambulatory Visit (HOSPITAL_COMMUNITY): Payer: Self-pay | Admitting: Psychiatry

## 2023-02-06 DIAGNOSIS — F411 Generalized anxiety disorder: Secondary | ICD-10-CM

## 2023-02-07 DIAGNOSIS — S8990XA Unspecified injury of unspecified lower leg, initial encounter: Secondary | ICD-10-CM | POA: Insufficient documentation

## 2023-02-07 DIAGNOSIS — M1712 Unilateral primary osteoarthritis, left knee: Secondary | ICD-10-CM | POA: Diagnosis not present

## 2023-02-07 DIAGNOSIS — S83411A Sprain of medial collateral ligament of right knee, initial encounter: Secondary | ICD-10-CM | POA: Insufficient documentation

## 2023-02-07 DIAGNOSIS — S83412D Sprain of medial collateral ligament of left knee, subsequent encounter: Secondary | ICD-10-CM | POA: Diagnosis not present

## 2023-02-10 ENCOUNTER — Other Ambulatory Visit: Payer: Self-pay | Admitting: *Deleted

## 2023-02-12 ENCOUNTER — Encounter: Payer: Self-pay | Admitting: *Deleted

## 2023-02-12 NOTE — Progress Notes (Signed)
Received approval for Hizentra 4 gm injections from [facility] effective 02/11/23 through 02/11/24.

## 2023-02-13 ENCOUNTER — Other Ambulatory Visit: Payer: Self-pay | Admitting: Cardiology

## 2023-02-13 DIAGNOSIS — E7800 Pure hypercholesterolemia, unspecified: Secondary | ICD-10-CM

## 2023-02-13 DIAGNOSIS — I2510 Atherosclerotic heart disease of native coronary artery without angina pectoris: Secondary | ICD-10-CM

## 2023-02-21 DIAGNOSIS — C61 Malignant neoplasm of prostate: Secondary | ICD-10-CM | POA: Diagnosis not present

## 2023-02-21 DIAGNOSIS — N200 Calculus of kidney: Secondary | ICD-10-CM | POA: Diagnosis not present

## 2023-02-21 DIAGNOSIS — Z8546 Personal history of malignant neoplasm of prostate: Secondary | ICD-10-CM | POA: Diagnosis not present

## 2023-02-21 DIAGNOSIS — N401 Enlarged prostate with lower urinary tract symptoms: Secondary | ICD-10-CM | POA: Diagnosis not present

## 2023-02-21 DIAGNOSIS — N400 Enlarged prostate without lower urinary tract symptoms: Secondary | ICD-10-CM | POA: Diagnosis not present

## 2023-02-21 DIAGNOSIS — E291 Testicular hypofunction: Secondary | ICD-10-CM | POA: Diagnosis not present

## 2023-02-24 ENCOUNTER — Encounter (HOSPITAL_COMMUNITY): Payer: Self-pay | Admitting: Licensed Clinical Social Worker

## 2023-02-24 ENCOUNTER — Ambulatory Visit (HOSPITAL_COMMUNITY): Payer: BLUE CROSS/BLUE SHIELD | Admitting: Licensed Clinical Social Worker

## 2023-02-24 DIAGNOSIS — M25562 Pain in left knee: Secondary | ICD-10-CM | POA: Diagnosis not present

## 2023-02-24 DIAGNOSIS — M79662 Pain in left lower leg: Secondary | ICD-10-CM | POA: Diagnosis not present

## 2023-02-24 DIAGNOSIS — F319 Bipolar disorder, unspecified: Secondary | ICD-10-CM | POA: Diagnosis not present

## 2023-02-24 NOTE — Progress Notes (Signed)
Virtual virtual Video Note  I connected with Alex [name] on 02/24/2023 at 2:00-3:00 PM EST by video-enabled virtual visit. I verified that I am speaking with the correct person using  two identifiers.I discussed the limitations of evaluation and management by telemedicine and the availability of in person appointments. The patient expressed understanding and agreed to proceed.    LOCATION: Patient: Home  Provider: Home office   History of Present Illness:  Pt was referred by Dr. Lolly Mustache for OP therapy for bipolar disorder and anxiety.  Treatment Goal Addressed:  Pt will meet with clinician 1x every 2 weeks for therapy to monitor for progress towards goals and address any barriers to success; Reduce depression from average severity level of 6/10 down to a 4/10 in next 90 days by engaging in 1-2 positive coping skills daily as part of developing self-care routine; Reduce average anxiety level from 7/10 down to 5/10 in next 90 days by utilizing 1-2 relaxation skills/grounding skills per day, such as mindful breathing, progressive muscle relaxation, positive visualizations.  Progress towards Treatment Goal: Progressing   Observations/Objective: Patient presented for today's session on time and was alert, oriented x5, with no evidence or self-report of SI/HI or A/V H.  Patient reported ongoing compliance with medication and denied any use of alcohol or illicit substances.  Clinician inquired about patient's current emotional ratings, as well as any significant changes in thoughts, feelings or behavior since previous session.  Patient reported scores of  6/10 for depression, 6/10 for anxiety, 5/10 for anger/irritability. Pt explored his emotional ratings, which remain high and discussed coping skills for stressors Pt identifies stressors and  allowed pt to explore and express thoughts and feelings associated with recent life situations and external stressors.Pt reported he fell while on vacation in  [place]. He is going to the dr after this session. 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 His wife was not supportive and blamed him for ruining her vacation. Pt asked open ended questions. Pt is going Thursday to [place] to help with his daughter and her baby to be born soon. [name] discussed concern about flying with a hemotoma. He is going to ask the dr. Clinician utilized MI OARS to reflect and summarize thoughts and feelings.         Assessment and plan: Counselor will continue to meet with patient to address treatment plan goals. Patient will continue to follow recommendations of providers and implement skills learned in session.    Collaboration of care: Other:   Continue working with providers    Patient/Guardian was advised Release of Information must be obtained prior to any record release in order to collaborate their care with an outside provider. Patient/Guardian was advised if they have not already done so to contact the registration department to sign all necessary forms in order for [place] to release information regarding their care.    Consent: Patient/Guardian gives verbal consent for treatment and assignment of benefits for services provided during this visit. Patient/Guardian expressed understanding and agreed to proceed.     Follow Up Instructions:  I discussed the assessment and treatment plan with the patient. The patient was provided an opportunity to ask questions and all were answered. The patient agreed with the plan and demonstrated an understanding of the instructions.   The patient was advised to call back or seek an in-person evaluation if the symptoms worsen or if the condition fails to improve as anticipated.  I provided 60 minutes of non-face-to-face time during this encounter.  Claribel Sachs S, LCAS  02/24/23

## 2023-02-25 ENCOUNTER — Other Ambulatory Visit: Payer: Self-pay | Admitting: Nurse Practitioner

## 2023-02-25 ENCOUNTER — Telehealth: Payer: Self-pay | Admitting: Cardiology

## 2023-02-25 ENCOUNTER — Inpatient Hospital Stay: Payer: BLUE CROSS/BLUE SHIELD | Admitting: Medical Oncology

## 2023-02-25 NOTE — Telephone Encounter (Signed)
*  STAT* If patient is at the pharmacy, call can be transferred to refill team.   1. Which medications need to be refilled? (please list name of each medication and dose if known)   telmisartan (MICARDIS) 80 MG tablet    2. Which pharmacy/location (including street and city if local pharmacy) is medication to be sent to? [id number] - [place], [place] - [address] AT [address] [facility] [address]   3. Do they need a 30 day or 90 day supply? 90

## 2023-03-10 NOTE — Progress Notes (Signed)
Cardiology Office Note   Date:  03/12/2023  ID:  Alex [name], DOB 23-Sep-1955, MRN [id number] PCP:  Josph Macho, MD [facility] Cardiologist: Olga Millers, MD  Reason for visit: Fatigue & Shortness of breath  History of Present Illness    Alex [name] is a 67 y.o. male with a hx of HTN, HLD nonobstructive CAD on LHC in 2007, dilated ascending aorta 43 mm followed by Dr Laneta Simmers, thyroid cancer s/p thyroidectomy, BPH, GERD, history of right portal vein shunt-followed at [facility], bipolar disease.   He was last seen by Robin Searing, NP in June 2023.  Patient denied chest pain.  Blood pressure was controlled.  Today, patient states he has been traveling a lot recently to see family.  He has several grandchildren.  He was recently visiting [place].  He took a break during a kayak trip, then fell on [place].  He injured his left leg and is following up with Ortho this week.  He brings up decreased stamina, weakness, dyspnea on exertion in the past 6 months.  He has a membership to the wellness center on [address] but has not been going as much.  He mentions recent palpitations with fluttering, he was not sure if it was related to meloxicam use on empty stomach.  Had a couple episodes of brief chest pain.  Patient asked if he should have a stress test.  He mentions a 40% blockage seen on left heart catheterization in 2007.  Patient does mention diagnosis of prostate cancer with recent surgery.  He states he is off testosterone which may be affecting his energy level.  He is also not sure if he is fatigued from recent travels.  Otherwise, patient denies lightheadedness, syncope, lower extremity edema.   Objective / Physical Exam   EKG today: Normal sinus rhythm with heart rate 75.  Vital signs:  BP 126/84 (BP Location: Left Arm, Patient Position: Sitting, Cuff Size: Normal)   Pulse 75   Ht 6' (1.829 m)   Wt 172 lb 9.6 oz (78.3 kg)   SpO2 98%   BMI 23.41 kg/m     GEN: No  acute distress NECK: No carotid bruits CARDIAC: RRR, no murmurs RESPIRATORY:  Clear to auscultation without rales, wheezing or rhonchi  EXTREMITIES: No edema  Assessment and Plan   Coronary artery disease Precordial pain, dyspnea exertion, decreased exercise tolerance -History of nonobstructive disease on LHC 2007 -EKG without ischemic changes. -Recommend coronary CTA to evaluate for worsening disease.  Will give metoprolol tartrate 50 mg prior to scan.   -Continue aspirin 81 mg daily, Lipitor.  Thoracic aortic aneurysm -With stable disease, no longer scheduled follow-up with Dr. Laneta Simmers -Followed by annual CTA - last 04/2022 measuring 4.1cm  Hypertension, well-controlled -Continue amlodipine 5 mg daily.  Continue telmisartan 80 mg daily.  Both refilled today. -Goal BP is <130/80.  Recommend DASH diet (high in vegetables, fruits, low-fat dairy products, whole grains, poultry, fish, and nuts and low in sweets, sugar-sweetened beverages, and red meats), salt restriction and increase physical activity.  Hyperlipidemia with goal LDL less than 70 -Patient states lipids recently checked by PCP.  Continue Lipitor 40 mg daily. -Discussed cholesterol lowering diets - Mediterranean diet, DASH diet, vegetarian diet, low-carbohydrate diet and avoidance of trans fats.  Discussed healthier choice substitutes.  Nuts, high-fiber foods, and fiber supplements may also improve lipids.    Disposition - Follow-up as scheduled with Dr. Jens Som in August following CTA.     Signed, Victorino Dike  Arelia Longest, PA-C  03/12/2023 [facility]

## 2023-03-12 ENCOUNTER — Ambulatory Visit: Payer: BLUE CROSS/BLUE SHIELD | Attending: Physician Assistant | Admitting: Physician Assistant

## 2023-03-12 ENCOUNTER — Encounter: Payer: Self-pay | Admitting: Physician Assistant

## 2023-03-12 VITALS — BP 126/84 | HR 75 | Ht 72.0 in | Wt 172.6 lb

## 2023-03-12 DIAGNOSIS — I2510 Atherosclerotic heart disease of native coronary artery without angina pectoris: Secondary | ICD-10-CM

## 2023-03-12 DIAGNOSIS — M79605 Pain in left leg: Secondary | ICD-10-CM | POA: Diagnosis not present

## 2023-03-12 DIAGNOSIS — I7121 Aneurysm of the ascending aorta, without rupture: Secondary | ICD-10-CM

## 2023-03-12 DIAGNOSIS — E785 Hyperlipidemia, unspecified: Secondary | ICD-10-CM

## 2023-03-12 DIAGNOSIS — M25572 Pain in left ankle and joints of left foot: Secondary | ICD-10-CM | POA: Diagnosis not present

## 2023-03-12 DIAGNOSIS — I10 Essential (primary) hypertension: Secondary | ICD-10-CM | POA: Diagnosis not present

## 2023-03-12 MED ORDER — AMLODIPINE BESYLATE 5 MG PO TABS
5.0000 mg | ORAL_TABLET | Freq: Every day | ORAL | 3 refills | Status: DC
Start: 2023-03-12 — End: 2024-04-16

## 2023-03-12 MED ORDER — METOPROLOL TARTRATE 50 MG PO TABS: 50.0000 mg | ORAL_TABLET | Freq: Once | ORAL | 0 refills | Status: DC

## 2023-03-12 NOTE — Patient Instructions (Signed)
Medication Instructions:  Metoprolol Tartrate 50 mg ( Take 1 Tablet 90 minutes to 2 Hours Prior to Scan). *If you need a refill on your cardiac medications before your next appointment, please call your pharmacy*   Lab Work: BMET Today If you have labs (blood work) drawn today and your tests are completely normal, you will receive your results only by: MyChart Message (if you have MyChart) OR A paper copy in the mail If you have any lab test that is abnormal or we need to change your treatment, we will call you to review the results.   Testing/Procedures:   Your cardiac CT will be scheduled at one of the below locations:   [facility] [address] [place], [name] [address] [phone]   If scheduled at [facility], please arrive at the [facility] Entrance (Entrance C2) of [facility] 30 minutes prior to test start time. You can use the FREE valet parking offered at entrance C (encouraged to control the heart rate for the test)  Proceed to the [facility] Radiology Department (first floor) to check-in and test prep.  All radiology patients and guests should use entrance C2 at [facility], accessed from [facility], even though the hospital's physical address listed is [address]    If scheduled at [facility] or [facility], please arrive 15 mins early for check-in and test prep.  There is spacious parking and easy access to the radiology department from the [facility] entrance. Please enter here and check-in with the desk attendant.   Please follow these instructions carefully (unless otherwise directed):  An IV will be required for this test and Nitroglycerin will be given.  Hold all erectile dysfunction medications at least 3 days (72 hrs) prior to test. (Ie viagra, cialis, sildenafil, tadalafil, etc)   On the Night Before the Test: Be  sure to Drink plenty of water. Do not consume any caffeinated/decaffeinated beverages or chocolate 12 hours prior to your test. Do not take any antihistamines 12 hours prior to your test. On the Day of the Test: Drink plenty of water until 1 hour prior to the test. Do not eat any food 1 hour prior to test. You may take your regular medications prior to the test.  Take metoprolol (Lopressor) two hours prior to test. If you take Furosemide/Hydrochlorothiazide/Spironolactone, please HOLD on the morning of the test.  After the Test: Drink plenty of water. After receiving IV contrast, you may experience a mild flushed feeling. This is normal. On occasion, you may experience a mild rash up to 24 hours after the test. This is not dangerous. If this occurs, you can take Benadryl 25 mg and increase your fluid intake. If you experience trouble breathing, this can be serious. If it is severe call 911 IMMEDIATELY. If it is mild, please call our office. If you take any of these medications: Glipizide/Metformin, Avandament, Glucavance, please do not take 48 hours after completing test unless otherwise instructed.  We will call to schedule your test 2-4 weeks out understanding that some insurance companies will need an authorization prior to the service being performed.   For more information and frequently asked questions, please visit our website : [email or web address]  For non-scheduling related questions, please contact the cardiac imaging nurse navigator should you have any questions/concerns: Cardiac Imaging Nurse Navigators Direct Office Dial: [phone]   For scheduling needs, including cancellations and rescheduling, please  call [place], [phone].    Follow-Up: At [facility], you and your health needs are our priority.  As part of our continuing mission to provide you with exceptional heart care, we have created designated Provider Care Teams.  These Care Teams  include your primary Cardiologist (physician) and Advanced Practice Providers (APPs -  Physician Assistants and Nurse Practitioners) who all work together to provide you with the care you need, when you need it.  We recommend signing up for the patient portal called "MyChart".  Sign up information is provided on this After Visit Summary.  MyChart is used to connect with patients for Virtual Visits (Telemedicine).  Patients are able to view lab/test results, encounter notes, upcoming appointments, etc.  Non-urgent messages can be sent to your provider as well.   To learn more about what you can do with MyChart, go to [email or web address].    Your next appointment:   Keep Scheduled Appointment  Provider:   Olga Millers, MD

## 2023-03-13 ENCOUNTER — Other Ambulatory Visit (HOSPITAL_COMMUNITY): Payer: Self-pay | Admitting: Psychiatry

## 2023-03-13 DIAGNOSIS — F319 Bipolar disorder, unspecified: Secondary | ICD-10-CM

## 2023-03-13 DIAGNOSIS — H353121 Nonexudative age-related macular degeneration, left eye, early dry stage: Secondary | ICD-10-CM | POA: Diagnosis not present

## 2023-03-13 DIAGNOSIS — H35351 Cystoid macular degeneration, right eye: Secondary | ICD-10-CM | POA: Diagnosis not present

## 2023-03-13 DIAGNOSIS — F411 Generalized anxiety disorder: Secondary | ICD-10-CM

## 2023-03-13 DIAGNOSIS — H04121 Dry eye syndrome of right lacrimal gland: Secondary | ICD-10-CM | POA: Diagnosis not present

## 2023-03-17 ENCOUNTER — Other Ambulatory Visit (HOSPITAL_COMMUNITY): Payer: Self-pay | Admitting: Psychiatry

## 2023-03-17 ENCOUNTER — Other Ambulatory Visit (HOSPITAL_COMMUNITY): Payer: Self-pay | Admitting: Physician Assistant

## 2023-03-17 ENCOUNTER — Telehealth (HOSPITAL_COMMUNITY): Payer: Self-pay | Admitting: *Deleted

## 2023-03-17 DIAGNOSIS — I77810 Thoracic aortic ectasia: Secondary | ICD-10-CM

## 2023-03-17 DIAGNOSIS — M47816 Spondylosis without myelopathy or radiculopathy, lumbar region: Secondary | ICD-10-CM | POA: Diagnosis not present

## 2023-03-17 DIAGNOSIS — F411 Generalized anxiety disorder: Secondary | ICD-10-CM

## 2023-03-17 DIAGNOSIS — M4326 Fusion of spine, lumbar region: Secondary | ICD-10-CM | POA: Diagnosis not present

## 2023-03-17 DIAGNOSIS — F319 Bipolar disorder, unspecified: Secondary | ICD-10-CM

## 2023-03-17 DIAGNOSIS — M791 Myalgia, unspecified site: Secondary | ICD-10-CM | POA: Diagnosis not present

## 2023-03-17 MED ORDER — ZOLPIDEM TARTRATE 10 MG PO TABS
ORAL_TABLET | ORAL | 0 refills | Status: DC
Start: 2023-03-17 — End: 2023-04-10

## 2023-03-17 NOTE — Telephone Encounter (Signed)
Reaching out to patient to offer assistance regarding upcoming cardiac imaging study; pt verbalizes understanding of appt date/time, parking situation and where to check in, pre-test NPO status and medications ordered, and verified current allergies; name and call back number provided for further questions should they arise   RN Navigator Cardiac Imaging [facility] [phone] office [phone] cell  

## 2023-03-17 NOTE — Telephone Encounter (Signed)
Done

## 2023-03-17 NOTE — Telephone Encounter (Signed)
Pt called requesting a refill of the Zolpidem 10 mg tabs. Last order that I can see in EMR was on 08/28/21 ( 1/2 at bedtime, then1/2 if needed). Pt says he's going on a road trip and will need the medication. Pt last appointment 12/24/22 and f/u scheduled for 04/09/23. Please review.

## 2023-03-17 NOTE — Telephone Encounter (Signed)
Attempted to call patient regarding upcoming cardiac CT appointment. Left message on voicemail with name and callback number Hayley Sharpe RN Navigator Cardiac Imaging [facility] [phone] Office   

## 2023-03-18 ENCOUNTER — Encounter (HOSPITAL_COMMUNITY): Payer: Self-pay | Admitting: Licensed Clinical Social Worker

## 2023-03-18 ENCOUNTER — Ambulatory Visit (HOSPITAL_COMMUNITY)
Admission: RE | Admit: 2023-03-18 | Discharge: 2023-03-18 | Disposition: A | Discharge status: Home / Self Care | Payer: BLUE CROSS/BLUE SHIELD | Source: Ambulatory Visit | Attending: Physician Assistant | Admitting: Physician Assistant

## 2023-03-18 ENCOUNTER — Ambulatory Visit (HOSPITAL_COMMUNITY): Payer: BLUE CROSS/BLUE SHIELD | Admitting: Licensed Clinical Social Worker

## 2023-03-18 ENCOUNTER — Other Ambulatory Visit (HOSPITAL_COMMUNITY): Payer: Self-pay

## 2023-03-18 DIAGNOSIS — I2510 Atherosclerotic heart disease of native coronary artery without angina pectoris: Secondary | ICD-10-CM | POA: Insufficient documentation

## 2023-03-18 DIAGNOSIS — I10 Essential (primary) hypertension: Secondary | ICD-10-CM | POA: Diagnosis not present

## 2023-03-18 DIAGNOSIS — S2242XA Multiple fractures of ribs, left side, initial encounter for closed fracture: Secondary | ICD-10-CM | POA: Diagnosis not present

## 2023-03-18 DIAGNOSIS — F319 Bipolar disorder, unspecified: Secondary | ICD-10-CM | POA: Diagnosis not present

## 2023-03-18 DIAGNOSIS — I700 Atherosclerosis of aorta: Secondary | ICD-10-CM | POA: Diagnosis not present

## 2023-03-18 DIAGNOSIS — E785 Hyperlipidemia, unspecified: Secondary | ICD-10-CM | POA: Insufficient documentation

## 2023-03-18 DIAGNOSIS — I7121 Aneurysm of the ascending aorta, without rupture: Secondary | ICD-10-CM | POA: Insufficient documentation

## 2023-03-18 DIAGNOSIS — I77810 Thoracic aortic ectasia: Secondary | ICD-10-CM | POA: Diagnosis not present

## 2023-03-18 DIAGNOSIS — F411 Generalized anxiety disorder: Secondary | ICD-10-CM

## 2023-03-18 MED ORDER — NITROGLYCERIN 0.4 MG SL SUBL
0.8000 mg | SUBLINGUAL_TABLET | Freq: Once | SUBLINGUAL | Status: AC
  Administered 2023-03-18: 0.8 mg via SUBLINGUAL

## 2023-03-18 MED ORDER — LORAZEPAM 0.5 MG PO TABS
ORAL_TABLET | ORAL | 0 refills | Status: DC
Start: 2023-03-18 — End: 2023-04-10

## 2023-03-18 MED ORDER — LAMOTRIGINE ER 300 MG PO TB24
1.0000 | ORAL_TABLET | ORAL | 0 refills | Status: DC
Start: 2023-03-18 — End: 2023-04-22

## 2023-03-18 MED ORDER — NITROGLYCERIN 0.4 MG SL SUBL
SUBLINGUAL_TABLET | SUBLINGUAL | Status: AC
  Filled 2023-03-18: qty 2

## 2023-03-18 MED ORDER — IOHEXOL 350 MG/ML SOLN: 95.0000 mL | Freq: Once | INTRAVENOUS | Status: DC | PRN

## 2023-03-18 MED ORDER — IOHEXOL 350 MG/ML SOLN
95.0000 mL | Freq: Once | INTRAVENOUS | Status: AC | PRN
  Administered 2023-03-18: 95 mL via INTRAVENOUS

## 2023-03-18 NOTE — Progress Notes (Signed)
Virtual virtual Video Note  I connected with Alex [name] on 03/18/2023 at 3:00-4:00 PM EST by video-enabled virtual visit. I verified that I am speaking with the correct person using  two identifiers.I discussed the limitations of evaluation and management by telemedicine and the availability of in person appointments. The patient expressed understanding and agreed to proceed.    LOCATION: Patient: Home  Provider: Home office   History of Present Illness:  Pt was referred by Dr. Lolly Mustache for OP therapy for bipolar disorder and anxiety.  Treatment Goal Addressed:  Pt will meet with clinician 1x every 2 weeks for therapy to monitor for progress towards goals and address any barriers to success; Reduce depression from average severity level of 6/10 down to a 4/10 in next 90 days by engaging in 1-2 positive coping skills daily as part of developing self-care routine; Reduce average anxiety level from 7/10 down to 5/10 in next 90 days by utilizing 1-2 relaxation skills/grounding skills per day, such as mindful breathing, progressive muscle relaxation, positive visualizations.  Progress towards Treatment Goal: Progressing   Observations/Objective: Patient presented for today's session on time and was alert, oriented x5, with no evidence or self-report of SI/HI or A/V H.  Patient reported ongoing compliance with medication and denied any use of alcohol or illicit substances.  Clinician inquired about patient's current emotional ratings, as well as any significant changes in thoughts, feelings or behavior since previous session.  Patient reported scores of  6/10 for depression, 6/10 for anxiety, 5/10 for anger/irritability. Pt explored his emotional ratings, which remain high and discussed coping skills for stressors Pt identifies stressors and  allowed pt to explore and express thoughts and feelings associated with recent life situations and external stressors.[name] and pt reviewed his continued stressors  individually: marital issues, son's mental health, physical health, future, pt's sexual health, after prostate cancer. "These continued stressors increase my symptoms of bipolar." [name] provided education on grounding techniques to use when he feels his increase in symptoms. Pt practiced in session.       Assessment and plan: Counselor will continue to meet with patient to address treatment plan goals. Patient will continue to follow recommendations of providers and implement skills learned in session, and practice between sessions. Diagnosis: Bipolar 1 disorder.    Collaboration of care: Other:   Continue working with providers    Patient/Guardian was advised Release of Information must be obtained prior to any record release in order to collaborate their care with an outside provider. Patient/Guardian was advised if they have not already done so to contact the registration department to sign all necessary forms in order for [place] to release information regarding their care.    Consent: Patient/Guardian gives verbal consent for treatment and assignment of benefits for services provided during this visit. Patient/Guardian expressed understanding and agreed to proceed.     Follow Up Instructions:  I discussed the assessment and treatment plan with the patient. The patient was provided an opportunity to ask questions and all were answered. The patient agreed with the plan and demonstrated an understanding of the instructions.   The patient was advised to call back or seek an in-person evaluation if the symptoms worsen or if the condition fails to improve as anticipated.  I provided 60 minutes of non-face-to-face time during this encounter.   , S, LCAS  03/18/23

## 2023-03-19 ENCOUNTER — Telehealth: Payer: Self-pay

## 2023-03-19 ENCOUNTER — Other Ambulatory Visit: Payer: Self-pay | Admitting: Physician Assistant

## 2023-03-19 ENCOUNTER — Other Ambulatory Visit: Payer: Self-pay | Admitting: Cardiology

## 2023-03-19 DIAGNOSIS — M4326 Fusion of spine, lumbar region: Secondary | ICD-10-CM

## 2023-03-19 DIAGNOSIS — M5416 Radiculopathy, lumbar region: Secondary | ICD-10-CM

## 2023-03-19 NOTE — Telephone Encounter (Addendum)
Called patient regarding results. Patient had understanding of results.----- Message from Cannon Kettle sent at 03/18/2023  5:52 PM EDT ----- Normal coronary origin with right dominance. No significant coronary artery disease other than minimal plaque (<25%) in the LAD/RCA. Ascending aortic aneurysm up to 42 mm. No significant change from prior study.   RECOMMENDATIONS: -No significant coronary artery disease to explain symptoms.   -Ascending aortic aneurysm stable --> repeat CT chest aorta to follow-up on ascending aortic aneurysm in 1 year.

## 2023-03-19 NOTE — Telephone Encounter (Addendum)
Called patient regarding results. Patient had understanding of results.----- Message from Alex [name] sent at 03/18/2023  1:53 PM EDT ----- Normal kidney function and electrolytes.

## 2023-03-25 ENCOUNTER — Telehealth (HOSPITAL_COMMUNITY): Payer: BLUE CROSS/BLUE SHIELD | Admitting: Psychiatry

## 2023-03-27 ENCOUNTER — Telehealth: Payer: Self-pay | Admitting: *Deleted

## 2023-03-27 NOTE — Telephone Encounter (Signed)
Call received from [facility] to verify Alex [name].'s dose of Hizentra.  [facility] states that Alex [name] is taking a decreased dose then what has been prescribed by Dr. Myna Hidalgo. Scheduling message sent  to have Alex [name] come in to be seen to discuss Hizentra dose, since appt in July was missed.

## 2023-03-28 ENCOUNTER — Telehealth (HOSPITAL_COMMUNITY): Payer: BLUE CROSS/BLUE SHIELD | Admitting: Psychiatry

## 2023-03-28 ENCOUNTER — Ambulatory Visit: Payer: BLUE CROSS/BLUE SHIELD | Admitting: Family Medicine

## 2023-03-31 ENCOUNTER — Encounter: Payer: Self-pay | Admitting: Hematology & Oncology

## 2023-03-31 ENCOUNTER — Encounter: Payer: Self-pay | Admitting: Internal Medicine

## 2023-03-31 ENCOUNTER — Inpatient Hospital Stay: Payer: BLUE CROSS/BLUE SHIELD | Admitting: Hematology & Oncology

## 2023-03-31 ENCOUNTER — Ambulatory Visit (INDEPENDENT_AMBULATORY_CARE_PROVIDER_SITE_OTHER): Payer: BLUE CROSS/BLUE SHIELD

## 2023-03-31 ENCOUNTER — Telehealth (HOSPITAL_COMMUNITY): Payer: BLUE CROSS/BLUE SHIELD | Admitting: Psychiatry

## 2023-03-31 ENCOUNTER — Ambulatory Visit: Payer: BLUE CROSS/BLUE SHIELD | Admitting: Internal Medicine

## 2023-03-31 ENCOUNTER — Inpatient Hospital Stay: Payer: BLUE CROSS/BLUE SHIELD | Attending: Hematology & Oncology

## 2023-03-31 ENCOUNTER — Other Ambulatory Visit: Payer: Self-pay

## 2023-03-31 VITALS — BP 138/82 | HR 79 | Temp 97.9°F | Ht 72.0 in | Wt 175.0 lb

## 2023-03-31 VITALS — BP 141/91 | HR 71 | Temp 98.0°F | Resp 19 | Ht 72.0 in | Wt 175.0 lb

## 2023-03-31 DIAGNOSIS — J471 Bronchiectasis with (acute) exacerbation: Secondary | ICD-10-CM

## 2023-03-31 DIAGNOSIS — Z85850 Personal history of malignant neoplasm of thyroid: Secondary | ICD-10-CM | POA: Insufficient documentation

## 2023-03-31 DIAGNOSIS — D839 Common variable immunodeficiency, unspecified: Secondary | ICD-10-CM

## 2023-03-31 DIAGNOSIS — C61 Malignant neoplasm of prostate: Secondary | ICD-10-CM | POA: Diagnosis not present

## 2023-03-31 DIAGNOSIS — R059 Cough, unspecified: Secondary | ICD-10-CM | POA: Diagnosis not present

## 2023-03-31 DIAGNOSIS — H6993 Unspecified Eustachian tube disorder, bilateral: Secondary | ICD-10-CM | POA: Diagnosis not present

## 2023-03-31 DIAGNOSIS — R062 Wheezing: Secondary | ICD-10-CM | POA: Diagnosis not present

## 2023-03-31 DIAGNOSIS — Z85828 Personal history of other malignant neoplasm of skin: Secondary | ICD-10-CM | POA: Insufficient documentation

## 2023-03-31 DIAGNOSIS — H65192 Other acute nonsuppurative otitis media, left ear: Secondary | ICD-10-CM | POA: Diagnosis not present

## 2023-03-31 DIAGNOSIS — Z862 Personal history of diseases of the blood and blood-forming organs and certain disorders involving the immune mechanism: Secondary | ICD-10-CM | POA: Insufficient documentation

## 2023-03-31 DIAGNOSIS — Z8546 Personal history of malignant neoplasm of prostate: Secondary | ICD-10-CM | POA: Insufficient documentation

## 2023-03-31 LAB — FERRITIN: Ferritin: 106 ng/mL (ref 24–336)

## 2023-03-31 LAB — CMP (CANCER CENTER ONLY)
ALT: 28 U/L (ref 0–44)
AST: 20 U/L (ref 15–41)
Albumin: 4.4 g/dL (ref 3.5–5.0)
Alkaline Phosphatase: 78 U/L (ref 38–126)
Anion gap: 9 (ref 5–15)
BUN: 23 mg/dL (ref 8–23)
CO2: 25 mmol/L (ref 22–32)
Calcium: 8.6 mg/dL — ABNORMAL LOW (ref 8.9–10.3)
Chloride: 103 mmol/L (ref 98–111)
Creatinine: 1.04 mg/dL (ref 0.61–1.24)
GFR, Estimated: >60 mL/min (ref >60)
Glucose, Bld: 133 mg/dL — ABNORMAL HIGH (ref 70–99)
Potassium: 4.3 mmol/L (ref 3.5–5.1)
Sodium: 137 mmol/L (ref 135–145)
Total Bilirubin: 0.5 mg/dL (ref 0.3–1.2)
Total Protein: 6.7 g/dL (ref 6.5–8.1)

## 2023-03-31 LAB — CBC WITH DIFFERENTIAL (CANCER CENTER ONLY)
Abs Immature Granulocytes: 0.24 10*3/uL — ABNORMAL HIGH (ref 0.00–0.07)
Basophils Absolute: 0.1 10*3/uL (ref 0.0–0.1)
Basophils Relative: 1 %
Eosinophils Absolute: 0.2 10*3/uL (ref 0.0–0.5)
Eosinophils Relative: 2 %
HCT: 42.6 % (ref 39.0–52.0)
Hemoglobin: 14.2 g/dL (ref 13.0–17.0)
Immature Granulocytes: 2 %
Lymphocytes Relative: 9 %
Lymphs Abs: 1 10*3/uL (ref 0.7–4.0)
MCH: 27.7 pg (ref 26.0–34.0)
MCHC: 33.3 g/dL (ref 30.0–36.0)
MCV: 83.2 fL (ref 80.0–100.0)
Monocytes Absolute: 0.5 10*3/uL (ref 0.1–1.0)
Monocytes Relative: 5 %
Neutro Abs: 8.8 10*3/uL — ABNORMAL HIGH (ref 1.7–7.7)
Neutrophils Relative %: 81 %
Platelet Count: 254 10*3/uL (ref 150–400)
RBC: 5.12 MIL/uL (ref 4.22–5.81)
RDW: 17.1 % — ABNORMAL HIGH (ref 11.5–15.5)
WBC Count: 10.8 10*3/uL — ABNORMAL HIGH (ref 4.0–10.5)
nRBC: 0 % (ref 0.0–0.2)

## 2023-03-31 LAB — RETICULOCYTES
Immature Retic Fract: 12.4 % (ref 2.3–15.9)
RBC.: 5.17 MIL/uL (ref 4.22–5.81)
Retic Count, Absolute: 110.1 10*3/uL (ref 19.0–186.0)
Retic Ct Pct: 2.1 % (ref 0.4–3.1)

## 2023-03-31 LAB — LACTATE DEHYDROGENASE: LDH: 231 U/L — ABNORMAL HIGH (ref 98–192)

## 2023-03-31 MED ORDER — AMOXICILLIN-POT CLAVULANATE 875-125 MG PO TABS: 1.0000 | ORAL_TABLET | Freq: Two times a day (BID) | ORAL | 0 refills | Status: AC

## 2023-03-31 MED ORDER — PREDNISONE 10 MG PO TABS: ORAL_TABLET | ORAL | 0 refills | Status: DC

## 2023-03-31 MED ORDER — ALBUTEROL SULFATE HFA 108 (90 BASE) MCG/ACT IN AERS: 2.0000 | INHALATION_SPRAY | Freq: Four times a day (QID) | RESPIRATORY_TRACT | 0 refills | Status: DC | PRN

## 2023-03-31 MED ORDER — SILDENAFIL CITRATE 20 MG PO TABS: 20.0000 mg | ORAL_TABLET | ORAL | 1 refills | Status: AC | PRN

## 2023-03-31 NOTE — Progress Notes (Signed)
Subjective:    Patient ID: Alex [name], male    DOB: 10/12/1955, 67 y.o.   MRN: [id number]      HPI Bry[place] is here for  Chief Complaint  Patient presents with   Ear Fullness    Right ear feels heavy and full; Left ear (hearing impaired)     Right ear - clogged and feels like he is under water.  The ear is achy.  His left ear has significantly decreased hearing-maybe has about 20% hearing.       He has had cold symptoms for approximately 12 days.  He just flew back from [place] and his ears became much worse and have not improved since flying back.  He states nasal congestion, right ear pain, hearing loss, PND, sinus pressure, sore throat, chest tightness, dry cough, possibly mild shortness of breath, wheezing and some dizziness.  He does have a history of bronchiectasis.     Taking mucinex   Medications and allergies reviewed with patient and updated if appropriate.  Current Outpatient Medications on File Prior to Visit  Medication Sig Dispense Refill   acetaminophen (TYLENOL) 500 MG tablet Take by mouth.     amLODipine (NORVASC) 5 MG tablet Take 1 tablet (5 mg total) by mouth daily. 90 tablet 3   aspirin EC 81 MG tablet Take 1 tablet by mouth daily.     atorvastatin (LIPITOR) 40 MG tablet TAKE 1 TABLET BY MOUTH EVERY DAY 90 tablet 1   AXIRON 30 MG/ACT SOLN Place 2 Act onto the skin daily.     azithromycin (ZITHROMAX) 250 MG tablet TAKE 2 TABLETS BY MOUTH TODAY, THEN TAKE 1 TABLET DAILY FOR 4 DAYS AS DIRECTED     brimonidine (ALPHAGAN) 0.2 % ophthalmic solution SMARTSIG:In Eye(s)     Coenzyme Q10 (CO Q 10 PO) Take 1 capsule by mouth daily.      EPINEPHrine 0.3 mg/0.3 mL IJ SOAJ injection Inject 0.3 mg into the muscle as needed.     erythromycin ophthalmic ointment Place into the right eye at bedtime.     Immune Globulin, Human, 4 GM/20ML SOLN Inject into the skin once a week. wednesday     LamoTRIgine 300 MG TB24 24 hour tablet Take 1 tablet (300 mg total) by  mouth every morning. 90 tablet 0   latanoprost (XALATAN) 0.005 % ophthalmic solution 1 DROP EACH EYE AT NIGHT     levothyroxine (SYNTHROID) 150 MCG tablet Take 1 tablet (150 mcg total) by mouth daily before breakfast. 90 tablet 2   LORazepam (ATIVAN) 0.5 MG tablet Take one tab daily and 2nd if needed for anxiety 45 tablet 0   Lutein 20 MG CAPS Take 1 each by mouth.     meloxicam (MOBIC) 15 MG tablet TAKE 1 TABLET BY MOUTH EVERY DAY AS NEEDED 90 tablet 0   methocarbamol (ROBAXIN) 500 MG tablet Take by mouth.     metoprolol succinate (TOPROL-XL) 50 MG 24 hr tablet      Multiple Vitamin (MULTI-VITAMINS) TABS Take by mouth.     OMEGA-3 KRILL OIL PO Take 1 capsule by mouth daily.      omeprazole (PRILOSEC) 20 MG capsule Take 20 mg by mouth every evening.     ondansetron (ZOFRAN-ODT) 4 MG disintegrating tablet      oxybutynin (DITROPAN) 5 MG tablet Take by mouth.     phenazopyridine (PYRIDIUM) 200 MG tablet Take by mouth.     polyethylene glycol (MIRALAX / GLYCOLAX) 17 g packet Take  by mouth.     senna-docusate (SENOKOT-S) 8.6-50 MG tablet Take by mouth.     sildenafil (REVATIO) 20 MG tablet Take 1 tablet by mouth once daily (Patient taking differently: Take 20 mg by mouth as needed. Take 1 tablet by mouth once daily) 30 tablet 5   sulfamethoxazole-trimethoprim (BACTRIM DS) 800-160 MG tablet Take 1 tablet by mouth every 12 (twelve) hours.     tadalafil (CIALIS) 5 MG tablet Take 5 mg by mouth.     tamsulosin (FLOMAX) 0.4 MG CAPS capsule TAKE 1 CAPSULE BY MOUTH EVERY DAY (Patient taking differently: as needed.) 90 capsule 1   telmisartan (MICARDIS) 80 MG tablet Take 1 tablet (80 mg total) by mouth daily. 90 tablet 1   testosterone (ANDROGEL) 50 MG/5GM (1%) GEL Place onto the skin daily.     timolol (TIMOPTIC) 0.5 % ophthalmic solution timolol maleate 0.5 % eye drops  INSTILL 1 DROP INTO RIGHT EYE TWICE A DAY     traMADol (ULTRAM) 50 MG tablet Take 50 mg by mouth as needed.     triamcinolone ointment  (KENALOG) 0.5 % Apply 1 Application topically 4 (four) times daily. On the lip 30 g 0   zolpidem (AMBIEN) 10 MG tablet TAKE 1/2 (ONE-HALF) TABLET BY MOUTH AT BEDTIME AND  TAKE  THE  OTHER  HALF  IF  NEEDED 30 tablet 0   metoprolol tartrate (LOPRESSOR) 50 MG tablet Take 1 tablet (50 mg total) by mouth once for 1 dose. Take 90 mins -2 hours Prior to Scan 1 tablet 0   No current facility-administered medications on file prior to visit.    Review of Systems  Constitutional:  Negative for fever.  HENT:  Positive for congestion, ear pain (right ear achy), hearing loss, postnasal drip, sinus pressure and sore throat.   Respiratory:  Positive for cough (dry), chest tightness, shortness of breath (mild) and wheezing.   Neurological:  Positive for dizziness. Negative for light-headedness and headaches.       Objective:   Vitals:   03/31/23 1026  BP: 138/82  Pulse: 79  Temp: 97.9 F (36.6 C)  SpO2: 99%   BP Readings from Last 3 Encounters:  03/31/23 138/82  03/18/23 122/80  03/12/23 126/84   Wt Readings from Last 3 Encounters:  03/31/23 175 lb (79.4 kg)  03/12/23 172 lb 9.6 oz (78.3 kg)  11/20/22 179 lb (81.2 kg)   Body mass index is 23.73 kg/m.    Physical Exam Constitutional:      General: He is not in acute distress.    Appearance: Normal appearance. He is not ill-appearing.  HENT:     Head: Normocephalic.     Right Ear: Tympanic membrane, ear canal and external ear normal. There is no impacted cerumen.     Left Ear: Ear canal and external ear normal. There is no impacted cerumen.     Ears:     Comments: Left TM with possible effusion    Mouth/Throat:     Mouth: Mucous membranes are moist.     Pharynx: No oropharyngeal exudate or posterior oropharyngeal erythema.  Eyes:     Conjunctiva/sclera: Conjunctivae normal.  Cardiovascular:     Rate and Rhythm: Normal rate and regular rhythm.  Pulmonary:     Effort: Pulmonary effort is normal. No respiratory distress.      Breath sounds: Wheezing (Bilateral with expiration) and rales (Bilateral with expiration) present.  Musculoskeletal:     Cervical back: Neck supple. No tenderness.  Lymphadenopathy:  Cervical: No cervical adenopathy.  Skin:    General: Skin is warm and dry.     Findings: No rash.  Neurological:     Mental Status: He is alert.            Assessment & Plan:     Bronchiectasis with acute exacerbation, wheezing, bilateral eustachian tube dysfunction with hearing loss, left ear effusion: Acute lower respiratory tract infection started 12 days ago Symptoms have gotten worse Has developed bilateral eustachian tube dysfunction with hearing loss, effusion Chest x-ray today Start albuterol inhaler and prednisone taper-40 mg x 3 days, 30 mg x 3 days, 20 mg x 3 days, 10 mg x 3 days and Augmentin 875-125 mg twice daily x 10 days Can continue Mucinex If there is no improvement in his symptoms advised for him to call or return for further evaluation

## 2023-03-31 NOTE — Progress Notes (Signed)
Hematology and Oncology Follow Up Visit  Alex [name] [id number] 07-08-1956 67 y.o. 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  Current Therapy:   IVIG 40 g IV monthly IV iron-Venofer given on 11/28/2022     Interim History:  Alex [name] is back for follow-up.  We last saw him back in January.  At that time, his IgG level was 769 mg/dL.  He is doing pretty well.  He has had really no complaints since we last saw him.  He has had no issues with infections.  He does his IVIG at home.  This is done subcutaneously.  Today, his Ig G level was 810 mg/dL.  He had iron studies that were done today.  His iron saturation was 25%.  The ferritin was 106.  We did had to give him some IV iron last time that I saw him.  At that time, his ferritin was only 14.    He had a CT angiogram that was done on 03/18/2023.  This showed an aortic root of 4.2 cm.  He had a mildly dilated ascending aorta of 4.2 cm.  He had a CT of his coronary arteries.  They all looked fine.  His coronary calcium score was 16.7.  He had a right dominant coronary artery system.  He has had no bleeding.  There is been no leg swelling.  Overall, I would have to say that his performance status is probably ECOG 1.   Medications:  Current Outpatient Medications:    acetaminophen (TYLENOL) 500 MG tablet, Take by mouth., Disp: , Rfl:    albuterol (VENTOLIN HFA) 108 (90 Base) MCG/ACT inhaler, Inhale 2 puffs into the lungs every 6 (six) hours as needed for wheezing or shortness of breath., Disp: 8 g, Rfl: 0   amLODipine (NORVASC) 5 MG tablet, Take 1 tablet (5 mg total) by mouth daily., Disp: 90 tablet, Rfl: 3   amoxicillin-clavulanate (AUGMENTIN) 875-125 MG tablet, Take 1 tablet by mouth 2 (two) times daily for 10 days., Disp: 20 tablet, Rfl: 0   aspirin EC 81 MG tablet, Take 1 tablet by mouth daily., Disp: , Rfl:     atorvastatin (LIPITOR) 40 MG tablet, TAKE 1 TABLET BY MOUTH EVERY DAY, Disp: 90 tablet, Rfl: 1   AXIRON 30 MG/ACT SOLN, Place 2 Act onto the skin daily., Disp: , Rfl:    brimonidine (ALPHAGAN) 0.2 % ophthalmic solution, SMARTSIG:In Eye(s), Disp: , Rfl:    Coenzyme Q10 (CO Q 10 PO), Take 1 capsule by mouth daily. , Disp: , Rfl:    erythromycin ophthalmic ointment, Place into the right eye at bedtime., Disp: , Rfl:    Immune Globulin, Human, 4 GM/20ML SOLN, Inject into the skin once a week. wednesday, Disp: , Rfl:    LamoTRIgine 300 MG TB24 24 hour tablet, Take 1 tablet (300 mg total) by mouth every morning., Disp: 90 tablet, Rfl: 0   latanoprost (XALATAN) 0.005 % ophthalmic solution, 1 DROP EACH EYE AT NIGHT, Disp: , Rfl:    levothyroxine (SYNTHROID) 150 MCG tablet, Take 1 tablet (150 mcg total) by mouth daily before breakfast., Disp: 90 tablet, Rfl: 2   LORazepam (ATIVAN) 0.5 MG tablet, Take one tab daily and 2nd if needed for anxiety, Disp: 45 tablet, Rfl: 0   meloxicam (MOBIC) 15 MG tablet, TAKE 1 TABLET BY MOUTH EVERY DAY AS NEEDED, Disp: 90 tablet, Rfl: 0   Multiple Vitamin (  MULTI-VITAMINS) TABS, Take by mouth., Disp: , Rfl:    OMEGA-3 KRILL OIL PO, Take 1 capsule by mouth daily. , Disp: , Rfl:    omeprazole (PRILOSEC) 20 MG capsule, Take 20 mg by mouth every evening., Disp: , Rfl:    predniSONE (DELTASONE) 10 MG tablet, Take 4 tabs po qd x 3 days, then 3 tabs po qd x 3 days, then 2 tabs po qd x 3 days, then 1 tab po qd x 3 days, Disp: 30 tablet, Rfl: 0   sildenafil (REVATIO) 20 MG tablet, Take 1 tablet (20 mg total) by mouth as needed., Disp: 30 tablet, Rfl: 1   tadalafil (CIALIS) 5 MG tablet, Take 5 mg by mouth., Disp: , Rfl:    tamsulosin (FLOMAX) 0.4 MG CAPS capsule, TAKE 1 CAPSULE BY MOUTH EVERY DAY (Patient taking differently: as needed.), Disp: 90 capsule, Rfl: 1   telmisartan (MICARDIS) 80 MG tablet, Take 1 tablet (80 mg total) by mouth daily., Disp: 90 tablet, Rfl: 1   timolol  (TIMOPTIC) 0.5 % ophthalmic solution, timolol maleate 0.5 % eye drops  INSTILL 1 DROP INTO RIGHT EYE TWICE A DAY, Disp: , Rfl:    traMADol (ULTRAM) 50 MG tablet, Take 50 mg by mouth as needed., Disp: , Rfl:    triamcinolone ointment (KENALOG) 0.5 %, Apply 1 Application topically 4 (four) times daily. On the lip, Disp: 30 g, Rfl: 0   zolpidem (AMBIEN) 10 MG tablet, TAKE 1/2 (ONE-HALF) TABLET BY MOUTH AT BEDTIME AND  TAKE  THE  OTHER  HALF  IF  NEEDED, Disp: 30 tablet, Rfl: 0   EPINEPHrine 0.3 mg/0.3 mL IJ SOAJ injection, Inject 0.3 mg into the muscle as needed. (Patient not taking: Reported on 03/31/2023), Disp: , Rfl:   Allergies:  Allergies  Allergen Reactions   Percocet [Oxycodone-Acetaminophen] Itching    Can take generic.  States only has a problem with percocet brand, also headache    Past Medical History, Surgical history, Social history, and Family History were reviewed and updated.  Review of Systems: Review of Systems  Constitutional: Negative.   HENT:  Negative.    Eyes: Negative.   Respiratory: Negative.    Cardiovascular: Negative.   Gastrointestinal: Negative.   Endocrine: Negative.   Genitourinary: Negative.    Musculoskeletal: Negative.   Skin: Negative.   Neurological: Negative.   Hematological: Negative.   Psychiatric/Behavioral: Negative.      Physical Exam:  height is 6' (1.829 m) and weight is 175 lb (79.4 kg). His oral temperature is 98 F (36.7 C). His blood pressure is 141/91 (abnormal) and his pulse is 71. His respiration is 19 and oxygen saturation is 100%.   Wt Readings from Last 3 Encounters:  03/31/23 175 lb (79.4 kg)  03/31/23 175 lb (79.4 kg)  03/12/23 172 lb 9.6 oz (78.3 kg)    Physical Exam Vitals reviewed.  HENT:     Head: Normocephalic and atraumatic.  Eyes:     Pupils: Pupils are equal, round, and reactive to light.  Cardiovascular:     Rate and Rhythm: Normal rate and regular rhythm.     Heart sounds: Normal heart sounds.   Pulmonary:     Effort: Pulmonary effort is normal.     Breath sounds: Normal breath sounds.  Abdominal:     General: Bowel sounds are normal.     Palpations: Abdomen is soft.  Musculoskeletal:        General: No tenderness or deformity. Normal range of motion.  Cervical back: Normal range of motion.  Lymphadenopathy:     Cervical: No cervical adenopathy.  Skin:    General: Skin is warm and dry.     Findings: No erythema or rash.  Neurological:     Mental Status: He is alert and oriented to person, place, and time.  Psychiatric:        Behavior: Behavior normal.        Thought Content: Thought content normal.        Judgment: Judgment normal.     Lab Results  Component Value Date   WBC 10.8 (H) 03/31/2023   HGB 14.2 03/31/2023   HCT 42.6 03/31/2023   MCV 83.2 03/31/2023   PLT 254 03/31/2023     Chemistry      Component Value Date/Time   NA 142 03/12/2023 1200   NA 139 06/27/2017 0950   K 4.0 03/12/2023 1200   K 4.0 06/27/2017 0950   CL 103 03/12/2023 1200   CO2 24 03/12/2023 1200   CO2 23 06/27/2017 0950   BUN 19 03/12/2023 1200   BUN 19.2 06/27/2017 0950   CREATININE 0.98 03/12/2023 1200   CREATININE 1.05 11/14/2022 1220   CREATININE 1.1 06/27/2017 0950      Component Value Date/Time   CALCIUM 8.9 03/12/2023 1200   CALCIUM 8.8 06/27/2017 0950   ALKPHOS 124 11/14/2022 1220   ALKPHOS 74 06/27/2017 0950   AST 29 11/14/2022 1220   AST 26 06/27/2017 0950   ALT 24 11/14/2022 1220   ALT 40 06/27/2017 0950   BILITOT 0.9 11/14/2022 1220   BILITOT 1.05 06/27/2017 0950      Impression and Plan: Mr. Buth is a very nice 67 year old male.  He has multiple problems.  He has had a history of periorbital squamous cell carcinoma.  This was treated.  He had thyroid cancer.  This also was treated and should not be a problem.  From my point of view, everything is looking pretty good.  He does not need any IV iron.  I know he is being followed by vascular surgery  because of the vascular issues.  Am happy that there is no obvious recurrent malignancy.  We will still plan to follow-up in 3 months.  Josph Macho, MD 8/19/20243:45 PM

## 2023-03-31 NOTE — Patient Instructions (Addendum)
     Have a chest xray downstairs    Medications changes include :   prednisone taper, Augmentin, albuterol inhaler.       Return if symptoms worsen or fail to improve.

## 2023-04-01 ENCOUNTER — Encounter: Payer: Self-pay | Admitting: *Deleted

## 2023-04-01 ENCOUNTER — Other Ambulatory Visit: Payer: BLUE CROSS/BLUE SHIELD

## 2023-04-01 ENCOUNTER — Ambulatory Visit: Payer: BLUE CROSS/BLUE SHIELD | Attending: Cardiology | Admitting: Cardiology

## 2023-04-01 ENCOUNTER — Encounter: Payer: Self-pay | Admitting: Cardiology

## 2023-04-01 ENCOUNTER — Telehealth (HOSPITAL_COMMUNITY): Payer: BLUE CROSS/BLUE SHIELD | Admitting: Psychiatry

## 2023-04-01 VITALS — BP 130/80 | HR 83 | Ht 72.0 in | Wt 176.6 lb

## 2023-04-01 DIAGNOSIS — E7800 Pure hypercholesterolemia, unspecified: Secondary | ICD-10-CM

## 2023-04-01 LAB — KAPPA/LAMBDA LIGHT CHAINS
Kappa free light chain: 11.8 mg/L (ref 3.3–19.4)
Kappa, lambda light chain ratio: 1.3 (ref 0.26–1.65)
Lambda free light chains: 9.1 mg/L (ref 5.7–26.3)

## 2023-04-01 LAB — IRON AND IRON BINDING CAPACITY (CC-WL,HP ONLY)
Iron: 109 ug/dL (ref 45–182)
Saturation Ratios: 25 % (ref 17.9–39.5)
TIBC: 435 ug/dL (ref 250–450)
UIBC: 326 ug/dL (ref 117–376)

## 2023-04-01 LAB — PSA, TOTAL AND FREE
PSA, Free Pct: 12.7 %
PSA, Free: 0.14 ng/mL
Prostate Specific Ag, Serum: 1.1 ng/mL (ref 0.0–4.0)

## 2023-04-01 LAB — IGG, IGA, IGM
IgA: 75 mg/dL (ref 61–437)
IgG (Immunoglobin G), Serum: 810 mg/dL (ref 603–1613)
IgM (Immunoglobulin M), Srm: 18 mg/dL — ABNORMAL LOW (ref 20–172)

## 2023-04-01 NOTE — Patient Instructions (Signed)
  Lab Work:  Your physician recommends that you return for lab work FASTING  If you have labs (blood work) drawn today and your tests are completely normal, you will receive your results only by: [place] (if you have [place]) OR A paper copy in the mail If you have any lab test that is abnormal or we need to change your treatment, we will call you to review the results.   Follow-Up: At [facility], you and your health needs are our priority.  As part of our continuing mission to provide you with exceptional heart care, we have created designated Provider Care Teams.  These Care Teams include your primary Cardiologist (physician) and Advanced Practice Providers (APPs -  Physician Assistants and Nurse Practitioners) who all work together to provide you with the care you need, when you need it.  We recommend signing up for the patient portal called "MyChart".  Sign up information is provided on this After Visit Summary.  MyChart is used to connect with patients for Virtual Visits (Telemedicine).  Patients are able to view lab/test results, encounter notes, upcoming appointments, etc.  Non-urgent messages can be sent to your provider as well.   To learn more about what you can do with MyChart, go to [email or web address].    Your next appointment:   12 month(s)  Provider:   [name], MD

## 2023-04-01 NOTE — Progress Notes (Signed)
HPI: FU CAD. Cath in 2007 showed a 40 LAD; EF 50-55. Had normal ETT 7/11 with 10:00 on [place]. Echocardiogram December 2012 showed normal LV function and grade 1 diastolic dysfunction. Holter monitor in January 2014 showed sinus rhythm, PACs, PVCs and brief PAT. Abdominal ultrasound May 2023 showed no aneurysm.  Coronary CTA August 2024 showed 4.2 cm ascending aortic aneurysm; calcium score 16.7 which was 24th percentile and minimal plaque.  Since he was last seen, the patient has dyspnea with more extreme activities but not with routine activities. It is relieved with rest. It is not associated with chest pain. There is no orthopnea, PND or pedal edema. There is no syncope or palpitations. There is no exertional chest pain.   Current Outpatient Medications  Medication Sig Dispense Refill   acetaminophen (TYLENOL) 500 MG tablet Take by mouth.     albuterol (VENTOLIN HFA) 108 (90 Base) MCG/ACT inhaler Inhale 2 puffs into the lungs every 6 (six) hours as needed for wheezing or shortness of breath. 8 g 0   amLODipine (NORVASC) 5 MG tablet Take 1 tablet (5 mg total) by mouth daily. 90 tablet 3   amoxicillin-clavulanate (AUGMENTIN) 875-125 MG tablet Take 1 tablet by mouth 2 (two) times daily for 10 days. 20 tablet 0   aspirin EC 81 MG tablet Take 1 tablet by mouth daily.     atorvastatin (LIPITOR) 40 MG tablet TAKE 1 TABLET BY MOUTH EVERY DAY 90 tablet 1   AXIRON 30 MG/ACT SOLN Place 2 Act onto the skin daily.     brimonidine (ALPHAGAN) 0.2 % ophthalmic solution SMARTSIG:In Eye(s)     Coenzyme Q10 (CO Q 10 PO) Take 1 capsule by mouth daily.      erythromycin ophthalmic ointment Place into the right eye at bedtime.     Immune Globulin, Human, 4 GM/20ML SOLN Inject into the skin once a week. wednesday     LamoTRIgine 300 MG TB24 24 hour tablet Take 1 tablet (300 mg total) by mouth every morning. 90 tablet 0   latanoprost (XALATAN) 0.005 % ophthalmic solution 1 DROP EACH EYE AT NIGHT      levothyroxine (SYNTHROID) 150 MCG tablet Take 1 tablet (150 mcg total) by mouth daily before breakfast. 90 tablet 2   LORazepam (ATIVAN) 0.5 MG tablet Take one tab daily and 2nd if needed for anxiety 45 tablet 0   Multiple Vitamin (MULTI-VITAMINS) TABS Take by mouth.     OMEGA-3 KRILL OIL PO Take 1 capsule by mouth daily.      omeprazole (PRILOSEC) 20 MG capsule Take 20 mg by mouth every evening.     predniSONE (DELTASONE) 10 MG tablet Take 4 tabs po qd x 3 days, then 3 tabs po qd x 3 days, then 2 tabs po qd x 3 days, then 1 tab po qd x 3 days 30 tablet 0   sildenafil (REVATIO) 20 MG tablet Take 1 tablet (20 mg total) by mouth as needed. 30 tablet 1   tadalafil (CIALIS) 5 MG tablet Take 5 mg by mouth.     tamsulosin (FLOMAX) 0.4 MG CAPS capsule TAKE 1 CAPSULE BY MOUTH EVERY DAY (Patient taking differently: as needed.) 90 capsule 1   telmisartan (MICARDIS) 80 MG tablet Take 1 tablet (80 mg total) by mouth daily. 90 tablet 1   timolol (TIMOPTIC) 0.5 % ophthalmic solution timolol maleate 0.5 % eye drops  INSTILL 1 DROP INTO RIGHT EYE TWICE A DAY     triamcinolone ointment (KENALOG)  0.5 % Apply 1 Application topically 4 (four) times daily. On the lip 30 g 0   zolpidem (AMBIEN) 10 MG tablet TAKE 1/2 (ONE-HALF) TABLET BY MOUTH AT BEDTIME AND  TAKE  THE  OTHER  HALF  IF  NEEDED 30 tablet 0   EPINEPHrine 0.3 mg/0.3 mL IJ SOAJ injection Inject 0.3 mg into the muscle as needed. (Patient not taking: Reported on 04/01/2023)     meloxicam (MOBIC) 15 MG tablet TAKE 1 TABLET BY MOUTH EVERY DAY AS NEEDED (Patient not taking: Reported on 04/01/2023) 90 tablet 0   traMADol (ULTRAM) 50 MG tablet Take 50 mg by mouth as needed. (Patient not taking: Reported on 04/01/2023)     No current facility-administered medications for this visit.     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   Cataract    LEFT EYE   Chronic back pain    Complication of anesthesia POST URINARY RETENTION---  2006 SHOULDER SURGERY MARKED BRADYCARDIA VAGAL RESPONSE NO ISSUE W/ SURGERY AFTER THIS ONE   WITH GENERAL ANESTHESIA, 15 YRS AGO VASOVAGAL REACTION NONE SINCE   Corneal hemorrhage 06/03/2018   Entire left eye   Coronary atherosclerosis CARDIOLOGIST- DR Tammi Boulier--  LAST VISIT 01-05-2012 IN EPIC   NON-OBSTRUCTIVE MILD DISEASE   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HCC)    Depression    Epicondylitis    right elbow   GERD (gastroesophageal reflux disease)    Glaucoma BOTH EYES   RIGHT EYE RADIATION DAMAGE   Hearing loss    BOTH EARS   Hearing loss    Bilateral   Hepatic cyst    Several, The lesion of concern in segment 6 of the liver has single large portal vein and hepatic vein branches extending to tt, in a pattern of enhancement which mirrors these vascular structures. 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 9 CM X 4. 3 CM PER  NOV 06-14-17  CHEST CTFOLOWED BY DR Jens Som YEARLY FOR   Positional vertigo    HX OF WITH SINUS INFECTIONS   Radial head fracture    Right   Squamous cell carcinoma of skin 06/04/2016   in situ-crown of scalp   Squamous cell carcinoma of skin 04/22/2017   in situ-crown scalp (txpbx)   Thoracic aortic aneurysm (HCC) 06/14/2017   last CT 4.1 CM Mild   Tinnitus    CONSTANT   Ulnar nerve compression    right elbow   Unsteady gait    especially with stairs, depth perception off   Urinary hesitancy    Wears glasses     Past Surgical History:  Procedure Laterality Date   CARDIAC CATHETERIZATION  01-16-2006  DR Maisie Fus WALL   MILD CORONARY ATHEROSCLEROSIS/ MID TO DISTAL LAD 40% STENOSIS/ LVF 50-55%   CARPAL TUNNEL RELEASE Right 11/03/2017   Procedure: RIGHT HAND CARPAL TUNNEL RELEASE;  Surgeon: Bradly Bienenstock, MD;  Location: [facility] [place];  Service: Orthopedics;  Laterality: Right;   CATARACT EXTRACTION Right    COLONSCOPY  2017 LAST DONE   MULTIPLE   ENDOSCOPY  LAST 2017   MULTIPLE DONE DILATION DONE ALSO   ESOPHAGOGASTRODUODENOSCOPY (EGD) WITH PROPOFOL N/A 02/24/2018   Procedure: ESOPHAGOGASTRODUODENOSCOPY (EGD) WITH PROPOFOL;  Surgeon: Carman Ching, MD;  Location: [facility];  Service: Endoscopy;  Laterality: N/A;   EXCISION RADIAL HEAD Right 11/03/2017   Procedure: RIGHT PROXIMAL RADIUS RADIAL HEAD RESECTION AND JOINT DEBRIDEMENT;  Surgeon: Bradly Bienenstock, MD;  Location[facility] [place];  Service: Orthopedics;  Laterality: Right;   EXTRACORPOREAL SHOCK WAVE LITHOTRIPSY Right 11/20/2020   Procedure: EXTRACORPOREAL SHOCK WAVE LITHOTRIPSY (ESWL);  Surgeon: Sebastian Ache, MD;  Location:  [facility];  Service: Urology;  Laterality: Right;  75 MINS   KNEE ARTHROSCOPY  05/01/2012   Procedure: ARTHROSCOPY KNEE;  Surgeon: Javier Docker, MD;  Location: [facility];  Service: Orthopedics;  Laterality: Left;  debridement and removal of loose body   LEFT ANKLE ARTHROSCOPY W/ DEBRIDEMENT  05-12-2007   LEFT HYDROCELECTOMY  03-29-2005   AND REPAIR LEFT INGUINAL HERNIA W/ MESH   MOHS SURGERY  2002   RIGHT ORBITAL CANCER   NASAL ENDOSCOPY  08-07-2005   RIGHT EPISTAXIS  / POST SEPTOPLASTY  (HX RIGHT ORBITAL CA & S/P RADIATION/ NECROSIS ANTERIOR END OF BOTH INFERIOR TURBINATES)   occuloplastic surgery  2002   PARS PLANA VITRECTOMY  11-06-2004   RIGHT EYE RADIATION RETINOPATHY W/ HEMORRHAGE   RADIAL HEAD ARTHROPLASTY Right 06/15/2018   Procedure: RIGHT ELBOW PROXIMAL RADIOULNAR JOINT DEBRIDEMENT AND ARTHROPLASTY;  Surgeon: Bradly Bienenstock, MD;  Location: [facility] [place];  Service: Orthopedics;  Laterality: Right;  BLOCK WITH SEDATION   REPAIR UNDESENDED RIGHT TESTICLE / RIGHT INGUINAL HERNIA  AGE 56   RIGHT ANKLE ARTHROSCOPY W/ EXTENSIVE DEBRIDEMENT  04/05/2008   x2   RIGHT SHOULDER SURGERY  2006   RIGHT SUPRAOMOHYOID NECK DISSECTION   03-08-2003   ZONES 1,2,3;   SUBMANDIBULAR MASS / METASTATIC SQUAMOUS CELL CARCINOMA RIGHT NECK   SAVORY DILATION N/A 02/24/2018   Procedure: SAVORY DILATION;  Surgeon: Carman Ching, MD;  Location: [facility];  Service: Endoscopy;  Laterality: N/A;   SEPTOPLASTY  NOV 2006   SHOULDER ARTHROSCOPY Left    SHOULDER ARTHROSCOPY W/ SUBACROMIAL DECOMPRESSION AND DISTAL CLAVICLE EXCISION  10-09-2008   AND DEBRIDEMENT OF RIGHT SHOULDER IMPINGEMENT & [facility] JOINT ARTHRITIS   SPINE SURGERY  2016   l 3  TO l 4 PLATE AND SCREWS   TOTAL THYROIDECTOMY  11-03-2001   PAPILLARY THYROID CARCINOMA   TRANSTHORACIC ECHOCARDIOGRAM  12/ 2012   grade I diastolic dysfunction/ ef 55-60%   ULNAR NERVE TRANSPOSITION Right 04/28/2014   Procedure:  RIGHT ELBOW ULNA NERVE RELEASE TRANSPOSTION AND MEDIAL EPICONDYLAR DEBRIDEMENT AND REPAIR;  Surgeon: Sharma Covert, MD;  Location: [facility] [place];  Service: Orthopedics;  Laterality: Right;    Social History   Socioeconomic History   Marital status: Married    Spouse name: Not on file   Number of children: Not on file   Years of education: Not on file   Highest education level: Not on file  Occupational History   Not on file  Tobacco Use   Smoking status: Never   Smokeless tobacco: Never  Vaping Use   Vaping status: Never Used  Substance and Sexual Activity   Alcohol use: Yes    Alcohol/week: 0.0 standard drinks of alcohol    Comment: 1-2 glasses per month   Drug use: No   Sexual activity: Yes    Partners: Female    Birth control/protection: None  Other Topics Concern   Not on file  Social History Narrative   Originally from [place]. Moved to [place] in 1984. 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 No international travel other than [place]. Has a dog at home, poodle mix. His daughter who lives with him now has a dog. No mold exposure. No bird exposure. No hot tub exposure. Enjoys gardening.    Social Determinants of Health   Financial Resource Strain: Not on file  Food Insecurity: No Food Insecurity (08/15/2021)   Received from [facility]   Hunger Vital Sign    Worried About Running Out of Food in the Last Year: Never true    Ran Out of Food in the Last Year: Never true  Transportation Needs: Not on file  Physical Activity: Not on file  Stress: Not on file  Social Connections: Unknown (12/19/2021)   [facility]   Social Network    Social Network: Not on file  Intimate Partner Violence: Unknown (11/14/2021)   Received from [facility]   HITS    Physically Hurt: Not on file    Insult or Talk Down To: Not on  file    Threaten Physical Harm: Not on file    Scream or Curse: Not on file    Family History  Problem Relation Age of Onset   Depression Sister    Rectal cancer Sister    Lung cancer Brother    Kidney disease Mother    Depression Mother    Stroke Father    COPD Father    Hypertension Father    Prostate cancer Father    Depression Daughter    Drug abuse Daughter    Psychiatric Illness Son     ROS: no fevers or chills, productive cough, hemoptysis, dysphasia, odynophagia, melena, hematochezia, dysuria, hematuria, rash, seizure activity, orthopnea, PND, pedal edema, claudication. Remaining systems are negative.  Physical Exam: Well-developed well-nourished in no acute distress.  Skin is warm and dry.  HEENT is normal.  Neck is supple.  Chest is clear to auscultation with normal expansion.  Cardiovascular exam is regular rate and rhythm.  Abdominal exam nontender or distended. No masses palpated. Extremities show no edema. neuro  grossly intact   A/P  1 coronary artery disease-minimal plaque on recent CTA.  Will treat with aspirin and statin.  2 thoracic aortic aneurysm-plan follow-up CTA August 2024.  3 hypertension-patient's blood pressure is controlled.  Continue present medications.  4 hyperlipidemia-continue statin.  Check lipids.  5 history of right portal vein shunt-patient is [facility] at [facility].  Olga Millers, MD

## 2023-04-02 ENCOUNTER — Ambulatory Visit (INDEPENDENT_AMBULATORY_CARE_PROVIDER_SITE_OTHER): Payer: BLUE CROSS/BLUE SHIELD | Admitting: Licensed Clinical Social Worker

## 2023-04-02 ENCOUNTER — Encounter (HOSPITAL_COMMUNITY): Payer: Self-pay | Admitting: Licensed Clinical Social Worker

## 2023-04-02 DIAGNOSIS — F319 Bipolar disorder, unspecified: Secondary | ICD-10-CM | POA: Diagnosis not present

## 2023-04-02 NOTE — Progress Notes (Signed)
Virtual virtual Video Note  I connected with Alex [name] on 04/02/2023 at 2:00-3:00 PM EST by video-enabled virtual visit. I verified that I am speaking with the correct person using  two identifiers.I discussed the limitations of evaluation and management by telemedicine and the availability of in person appointments. The patient expressed understanding and agreed to proceed.    LOCATION: Patient: Home  Provider: Home office   History of Present Illness:  Pt was referred by Dr. Lolly Mustache for OP therapy for bipolar disorder and anxiety.  Treatment Goal Addressed:  Pt will meet with clinician 1x every 2 weeks for therapy to monitor for progress towards goals and address any barriers to success; Reduce depression from average severity level of 6/10 down to a 4/10 in next 90 days by engaging in 1-2 positive coping skills daily as part of developing self-care routine; Reduce average anxiety level from 7/10 down to 5/10 in next 90 days by utilizing 1-2 relaxation skills/grounding skills per day, such as mindful breathing, progressive muscle relaxation, positive visualizations.  Progress towards Treatment Goal: Progressing   Observations/Objective: Patient presented for today's session on time and was alert, oriented x5, with no evidence or self-report of SI/HI or A/V H.  Patient reported ongoing compliance with medication and denied any use of alcohol or illicit substances.  Clinician inquired about patient's current emotional ratings, as well as any significant changes in thoughts, feelings or behavior since previous session. Patient reported scores of  6/10 for depression, 6/10 for anxiety, 5/10 for anger/irritability. Pt explored his emotional ratings, which remain high and discussed coping skills for stressors Pt identifies stressors and  allowed pt to explore and express thoughts and feelings associated with recent life situations and external stressors.[name] and pt reviewed his continued stressors  individually: marital issues, son's mental health, physical health, future, pt's sexual health, after prostate cancer.  Clinician engaged in discussion on identified positives and utilized MI, OARS to assess how these have impacted client's Bipolar moods. Clinician allowed space for further processing of emotions and provided supportive statements throughout session.      Assessment and plan: Counselor will continue to meet with patient to address treatment plan goals. Patient will continue to follow recommendations of providers and implement skills learned in session, and practice between sessions. Diagnosis: Bipolar 1 disorder.    Collaboration of care: Other:   Continue working with providers    Patient/Guardian was advised Release of Information must be obtained prior to any record release in order to collaborate their care with an outside provider. Patient/Guardian was advised if they have not already done so to contact the registration department to sign all necessary forms in order for [place] to release information regarding their care.    Consent: Patient/Guardian gives verbal consent for treatment and assignment of benefits for services provided during this visit. Patient/Guardian expressed understanding and agreed to proceed.     Follow Up Instructions:  I discussed the assessment and treatment plan with the patient. The patient was provided an opportunity to ask questions and all were answered. The patient agreed with the plan and demonstrated an understanding of the instructions.   The patient was advised to call back or seek an in-person evaluation if the symptoms worsen or if the condition fails to improve as anticipated.  I provided 60 minutes of non-face-to-face time during this encounter.   [name] S, LCAS  04/02/23

## 2023-04-03 LAB — PROTEIN ELECTROPHORESIS, SERUM, WITH REFLEX
A/G Ratio: 1.4 (ref 0.7–1.7)
Albumin ELP: 3.7 g/dL (ref 2.9–4.4)
Alpha-1-Globulin: 0.3 g/dL (ref 0.0–0.4)
Alpha-2-Globulin: 0.7 g/dL (ref 0.4–1.0)
Beta Globulin: 1 g/dL (ref 0.7–1.3)
Gamma Globulin: 0.7 g/dL (ref 0.4–1.8)
Globulin, Total: 2.7 g/dL (ref 2.2–3.9)
Total Protein ELP: 6.4 g/dL (ref 6.0–8.5)

## 2023-04-07 ENCOUNTER — Ambulatory Visit: Admission: RE | Admit: 2023-04-07 | Payer: BLUE CROSS/BLUE SHIELD | Source: Ambulatory Visit

## 2023-04-07 ENCOUNTER — Ambulatory Visit: Payer: BLUE CROSS/BLUE SHIELD | Admitting: Cardiology

## 2023-04-07 ENCOUNTER — Other Ambulatory Visit (HOSPITAL_COMMUNITY): Payer: Self-pay | Admitting: Psychiatry

## 2023-04-07 DIAGNOSIS — F319 Bipolar disorder, unspecified: Secondary | ICD-10-CM

## 2023-04-07 DIAGNOSIS — M4326 Fusion of spine, lumbar region: Secondary | ICD-10-CM

## 2023-04-07 DIAGNOSIS — M5416 Radiculopathy, lumbar region: Secondary | ICD-10-CM | POA: Diagnosis not present

## 2023-04-07 DIAGNOSIS — F411 Generalized anxiety disorder: Secondary | ICD-10-CM

## 2023-04-08 ENCOUNTER — Ambulatory Visit (HOSPITAL_COMMUNITY): Payer: BLUE CROSS/BLUE SHIELD | Admitting: Licensed Clinical Social Worker

## 2023-04-09 ENCOUNTER — Ambulatory Visit (INDEPENDENT_AMBULATORY_CARE_PROVIDER_SITE_OTHER): Payer: BLUE CROSS/BLUE SHIELD | Admitting: Licensed Clinical Social Worker

## 2023-04-09 ENCOUNTER — Telehealth (HOSPITAL_COMMUNITY): Payer: BLUE CROSS/BLUE SHIELD | Admitting: Psychiatry

## 2023-04-09 DIAGNOSIS — F319 Bipolar disorder, unspecified: Secondary | ICD-10-CM | POA: Diagnosis not present

## 2023-04-10 ENCOUNTER — Other Ambulatory Visit (HOSPITAL_COMMUNITY): Payer: Self-pay

## 2023-04-10 ENCOUNTER — Encounter: Payer: Self-pay | Admitting: Family

## 2023-04-10 DIAGNOSIS — F411 Generalized anxiety disorder: Secondary | ICD-10-CM

## 2023-04-10 DIAGNOSIS — F319 Bipolar disorder, unspecified: Secondary | ICD-10-CM

## 2023-04-10 MED ORDER — ZOLPIDEM TARTRATE 10 MG PO TABS
ORAL_TABLET | ORAL | 0 refills | Status: DC
Start: 2023-04-10 — End: 2023-04-22

## 2023-04-10 MED ORDER — LORAZEPAM 0.5 MG PO TABS
ORAL_TABLET | ORAL | 0 refills | Status: DC
Start: 2023-04-10 — End: 2023-10-03

## 2023-04-11 ENCOUNTER — Other Ambulatory Visit: Payer: Self-pay

## 2023-04-11 ENCOUNTER — Emergency Department (HOSPITAL_COMMUNITY)
Admission: EM | Admit: 2023-04-11 | Discharge: 2023-04-11 | Disposition: A | Discharge status: Home / Self Care | Payer: BLUE CROSS/BLUE SHIELD | Attending: Emergency Medicine | Admitting: Emergency Medicine

## 2023-04-11 ENCOUNTER — Encounter (HOSPITAL_COMMUNITY): Payer: Self-pay

## 2023-04-11 ENCOUNTER — Emergency Department (HOSPITAL_COMMUNITY): Payer: BLUE CROSS/BLUE SHIELD

## 2023-04-11 DIAGNOSIS — Z8546 Personal history of malignant neoplasm of prostate: Secondary | ICD-10-CM | POA: Insufficient documentation

## 2023-04-11 DIAGNOSIS — D72829 Elevated white blood cell count, unspecified: Secondary | ICD-10-CM | POA: Diagnosis not present

## 2023-04-11 DIAGNOSIS — L03116 Cellulitis of left lower limb: Secondary | ICD-10-CM | POA: Diagnosis not present

## 2023-04-11 DIAGNOSIS — M79662 Pain in left lower leg: Secondary | ICD-10-CM | POA: Diagnosis not present

## 2023-04-11 DIAGNOSIS — Z7982 Long term (current) use of aspirin: Secondary | ICD-10-CM | POA: Insufficient documentation

## 2023-04-11 DIAGNOSIS — M7989 Other specified soft tissue disorders: Secondary | ICD-10-CM | POA: Diagnosis not present

## 2023-04-11 DIAGNOSIS — R2242 Localized swelling, mass and lump, left lower limb: Secondary | ICD-10-CM | POA: Diagnosis not present

## 2023-04-11 LAB — CBC WITH DIFFERENTIAL/PLATELET
Abs Immature Granulocytes: 0.13 10*3/uL — ABNORMAL HIGH (ref 0.00–0.07)
Basophils Absolute: 0.1 10*3/uL (ref 0.0–0.1)
Basophils Relative: 1 %
Eosinophils Absolute: 0.3 10*3/uL (ref 0.0–0.5)
Eosinophils Relative: 2 %
HCT: 44 % (ref 39.0–52.0)
Hemoglobin: 14.5 g/dL (ref 13.0–17.0)
Immature Granulocytes: 1 %
Lymphocytes Relative: 14 %
Lymphs Abs: 1.7 10*3/uL (ref 0.7–4.0)
MCH: 27.7 pg (ref 26.0–34.0)
MCHC: 33 g/dL (ref 30.0–36.0)
MCV: 84.1 fL (ref 80.0–100.0)
Monocytes Absolute: 1.4 10*3/uL — ABNORMAL HIGH (ref 0.1–1.0)
Monocytes Relative: 11 %
Neutro Abs: 9.1 10*3/uL — ABNORMAL HIGH (ref 1.7–7.7)
Neutrophils Relative %: 71 %
Platelets: 186 10*3/uL (ref 150–400)
RBC: 5.23 MIL/uL (ref 4.22–5.81)
RDW: 16.5 % — ABNORMAL HIGH (ref 11.5–15.5)
WBC: 12.6 10*3/uL — ABNORMAL HIGH (ref 4.0–10.5)
nRBC: 0 % (ref 0.0–0.2)

## 2023-04-11 LAB — COMPREHENSIVE METABOLIC PANEL
ALT: 37 U/L (ref 0–44)
AST: 23 U/L (ref 15–41)
Albumin: 3.9 g/dL (ref 3.5–5.0)
Alkaline Phosphatase: 70 U/L (ref 38–126)
Anion gap: 9 (ref 5–15)
BUN: 27 mg/dL — ABNORMAL HIGH (ref 8–23)
CO2: 21 mmol/L — ABNORMAL LOW (ref 22–32)
Calcium: 8.4 mg/dL — ABNORMAL LOW (ref 8.9–10.3)
Chloride: 105 mmol/L (ref 98–111)
Creatinine, Ser: 0.91 mg/dL (ref 0.61–1.24)
GFR, Estimated: >60 mL/min (ref >60)
Glucose, Bld: 119 mg/dL — ABNORMAL HIGH (ref 70–99)
Potassium: 3.9 mmol/L (ref 3.5–5.1)
Sodium: 135 mmol/L (ref 135–145)
Total Bilirubin: 0.9 mg/dL (ref 0.3–1.2)
Total Protein: 6.8 g/dL (ref 6.5–8.1)

## 2023-04-11 MED ORDER — DOXYCYCLINE HYCLATE 100 MG PO TABS
100.0000 mg | ORAL_TABLET | Freq: Once | ORAL | Status: AC
  Administered 2023-04-11: 100 mg via ORAL
  Filled 2023-04-11: qty 1

## 2023-04-11 MED ORDER — CEPHALEXIN 500 MG PO CAPS: 500.0000 mg | ORAL_CAPSULE | Freq: Four times a day (QID) | ORAL | 0 refills | Status: AC

## 2023-04-11 MED ORDER — CEPHALEXIN 500 MG PO CAPS
500.0000 mg | ORAL_CAPSULE | Freq: Once | ORAL | Status: AC
  Administered 2023-04-11: 500 mg via ORAL
  Filled 2023-04-11: qty 1

## 2023-04-11 MED ORDER — DOXYCYCLINE HYCLATE 100 MG PO CAPS: 100.0000 mg | ORAL_CAPSULE | Freq: Two times a day (BID) | ORAL | 0 refills | Status: AC

## 2023-04-11 NOTE — Discharge Instructions (Addendum)
Seen in the emergency department for your leg redness.  This does appear that you have cellulitis which is a skin infection.  We have given you antibiotics and you should complete this as prescribed.  We have marked the area of redness around your leg.  You should follow-up with your primary doctor next week to have your wound rechecked.  You should return to the emergency department if you are having streaking redness beyond the lines after 24 hours of antibiotics, having fevers despite the antibiotics, you are vomiting and unable to tolerate the antibiotics or if you have any other new or concerning symptoms.

## 2023-04-11 NOTE — ED Provider Notes (Signed)
[place] EMERGENCY DEPARTMENT AT [facility] Provider Note   CSN: [id number] Arrival date & time: 04/11/23  1022     History  Chief Complaint  Patient presents with   Wound Infection    Alex [name] is a 67 y.o. male.  Patient is a 67 year old male with PMH common variable immunodeficiency, prostate cancer not currently on treatment and recent L leg hematoma presenting to the ER with LE redness. Patient reports he fell in July and sustained a hematoma to his L calf.  He states that he had been following with orthopedics and it had been healing well.  He states that he noticed last night a small area of redness to his left ankle and states it is spread by this morning.  He states that he spoke with his orthopedic doctor who recommended he come to the ER for evaluation for possible cellulitis.  He states that he has mild discomfort in his leg and some swelling.  He denies any fevers.  He denies any nausea or vomiting.  The history is provided by the patient.       Home Medications Prior to Admission medications   Medication Sig Start Date End Date Taking? Authorizing Provider  cephALEXin (KEFLEX) 500 MG capsule Take 1 capsule (500 mg total) by mouth 4 (four) times daily for 7 days. 04/11/23 04/18/23 Yes Elayne Snare K, DO  doxycycline (VIBRAMYCIN) 100 MG capsule Take 1 capsule (100 mg total) by mouth 2 (two) times daily for 7 days. 04/11/23 04/18/23 Yes Kingsley, Benetta Spar K, DO  acetaminophen (TYLENOL) 500 MG tablet Take by mouth.    [provider]  albuterol (VENTOLIN HFA) 108 (90 Base) MCG/ACT inhaler Inhale 2 puffs into the lungs every 6 (six) hours as needed for wheezing or shortness of breath. 03/31/23   Pincus Sanes, MD  amLODipine (NORVASC) 5 MG tablet Take 1 tablet (5 mg total) by mouth daily. 03/12/23   Cannon Kettle, PA-C  aspirin EC 81 MG tablet Take 1 tablet by mouth daily.    [provider]  atorvastatin (LIPITOR) 40 MG tablet  TAKE 1 TABLET BY MOUTH EVERY DAY 02/14/23   Crenshaw, Madolyn Frieze, MD  AXIRON 30 MG/ACT SOLN Place 2 Act onto the skin daily. 06/28/15   [provider]  brimonidine (ALPHAGAN) 0.2 % ophthalmic solution SMARTSIG:In Eye(s)    [provider]  Coenzyme Q10 (CO Q 10 PO) Take 1 capsule by mouth daily.     [provider]  EPINEPHrine 0.3 mg/0.3 mL IJ SOAJ injection Inject 0.3 mg into the muscle as needed. Patient not taking: Reported on 04/01/2023    [provider]  erythromycin ophthalmic ointment Place into the right eye at bedtime. 07/30/22   [provider]  Immune Globulin, Human, 4 GM/20ML SOLN Inject into the skin once a week. wednesday    [provider]  LamoTRIgine 300 MG TB24 24 hour tablet Take 1 tablet (300 mg total) by mouth every morning. 03/18/23   Arfeen, Phillips Grout, MD  latanoprost (XALATAN) 0.005 % ophthalmic solution 1 DROP EACH EYE AT NIGHT    [provider]  levothyroxine (SYNTHROID) 150 MCG tablet Take 1 tablet (150 mcg total) by mouth daily before breakfast. 11/20/22   Plotnikov, Georgina Quint, MD  LORazepam (ATIVAN) 0.5 MG tablet Take one tab daily and 2nd if needed for anxiety 04/10/23   Arfeen, Phillips Grout, MD  meloxicam (MOBIC) 15 MG tablet TAKE 1 TABLET BY MOUTH  EVERY DAY AS NEEDED Patient not taking: Reported on 04/01/2023 02/28/22   Plotnikov, Georgina Quint, MD  Multiple Vitamin (MULTI-VITAMINS) TABS Take by mouth.    [provider]  OMEGA-3 KRILL OIL PO Take 1 capsule by mouth daily.     [provider]  omeprazole (PRILOSEC) 20 MG capsule Take 20 mg by mouth every evening.    [provider]  predniSONE (DELTASONE) 10 MG tablet Take 4 tabs po qd x 3 days, then 3 tabs po qd x 3 days, then 2 tabs po qd x 3 days, then 1 tab po qd x 3 days 03/31/23   Pincus Sanes, MD  sildenafil (REVATIO) 20 MG tablet Take 1 tablet (20 mg total) by mouth as needed. 03/31/23   Pincus Sanes, MD  tadalafil (CIALIS) 5 MG tablet  Take 5 mg by mouth.    [provider]  tamsulosin (FLOMAX) 0.4 MG CAPS capsule TAKE 1 CAPSULE BY MOUTH EVERY DAY Patient taking differently: as needed. 06/07/19   Plotnikov, Georgina Quint, MD  telmisartan (MICARDIS) 80 MG tablet Take 1 tablet (80 mg total) by mouth daily. 03/19/23   Lewayne Bunting, MD  timolol (TIMOPTIC) 0.5 % ophthalmic solution timolol maleate 0.5 % eye drops  INSTILL 1 DROP INTO RIGHT EYE TWICE A DAY 10/05/19   [provider]  traMADol (ULTRAM) 50 MG tablet Take 50 mg by mouth as needed. Patient not taking: Reported on 04/01/2023    [provider]  triamcinolone ointment (KENALOG) 0.5 % Apply 1 Application topically 4 (four) times daily. On the lip 11/20/22 11/20/23  Plotnikov, Georgina Quint, MD  zolpidem (AMBIEN) 10 MG tablet TAKE 1/2 (ONE-HALF) TABLET BY MOUTH AT BEDTIME AND  TAKE  THE  OTHER  HALF  IF  NEEDED 04/10/23   Arfeen, Phillips Grout, MD      Allergies    Percocet [oxycodone-acetaminophen]    Review of Systems   Review of Systems  Physical Exam Updated Vital Signs BP (!) 158/113 Comment: nurse notified  Pulse 70   Temp 97.9 F (36.6 C) (Oral)   Resp 15   Ht 6' (1.829 m)   Wt 77.1 kg   SpO2 99%   BMI 23.06 kg/m  Physical Exam Vitals and nursing note reviewed.  Constitutional:      General: He is not in acute distress.    Appearance: Normal appearance.  HENT:     Head: Normocephalic and atraumatic.     Nose: Nose normal.     Mouth/Throat:     Mouth: Mucous membranes are moist.     Pharynx: Oropharynx is clear.  Eyes:     Extraocular Movements: Extraocular movements intact.     Conjunctiva/sclera: Conjunctivae normal.  Cardiovascular:     Rate and Rhythm: Normal rate and regular rhythm.     Heart sounds: Normal heart sounds.  Pulmonary:     Effort: Pulmonary effort is normal.     Breath sounds: Normal breath sounds.  Abdominal:     General: Abdomen is flat.     Palpations: Abdomen is soft.  Musculoskeletal:        General:  Normal range of motion.     Cervical back: Normal range of motion.     Left lower leg: Edema (1+) present.  Skin:    General: Skin is warm and dry.     Comments: ~1 cm ulceration to anterior shin, surrounding erythema and warmth to ankle and posterior calf, no drainage, no palpable fluctuance  or crepitus   Neurological:     General: No focal deficit present.     Mental Status: He is alert and oriented to person, place, and time.  Psychiatric:        Mood and Affect: Mood normal.        Behavior: Behavior normal.        ED Results / Procedures / Treatments   Labs (all labs ordered are listed, but only abnormal results are displayed) Labs Reviewed  COMPREHENSIVE METABOLIC PANEL - Abnormal; Notable for the following components:      Result Value   CO2 21 (*)    Glucose, Bld 119 (*)    BUN 27 (*)    Calcium 8.4 (*)    All other components within normal limits  CBC WITH DIFFERENTIAL/PLATELET - Abnormal; Notable for the following components:   WBC 12.6 (*)    RDW 16.5 (*)    Neutro Abs 9.1 (*)    Monocytes Absolute 1.4 (*)    Abs Immature Granulocytes 0.13 (*)    All other components within normal limits    EKG None  Radiology DG Tibia/Fibula Left  Result Date: 04/11/2023 CLINICAL DATA:  Deep wound infection EXAM: LEFT TIBIA AND FIBULA - 2 VIEW COMPARISON:  None Available. FINDINGS: No fracture or dislocation. Preserved joint spaces and bone mineralization. Soft tissue swelling about the ankle. On the lateral view there is small lucency in the soft tissues anterior to the mid lower leg. This appears to be slightly lateral on the AP view. Please correlate for soft tissue abnormality. Overall if there is concern of bone infection, MRI or bone scan is recommended for further delineation. IMPRESSION: No acute osseous abnormality. Small soft tissue lucency along the anterolateral lower leg. Please correlate with exact location of abnormality. Electronically Signed   By: Karen Kays  [name]   On: 04/11/2023 12:42    Procedures Procedures    Medications Ordered in ED Medications  cephALEXin (KEFLEX) capsule 500 mg (500 mg Oral Given 04/11/23 1340)  doxycycline (VIBRA-TABS) tablet 100 mg (100 mg Oral Given 04/11/23 1341)    ED Course/ Medical Decision Making/ A&P Clinical Course as of 04/11/23 1438  Fri Apr 11, 2023  1248 No signs of deep infection on XR. Will be treated for cellulitis. [VK]    Clinical Course User Index [VK] Rexford Maus, DO                                 Medical Decision Making This patient presents to the ED with chief complaint(s) of LE wound/redness with pertinent past medical history of recent LE hematoma, immunodeficiency, prostate cancer not on chemo which further complicates the presenting complaint. The complaint involves an extensive differential diagnosis and also carries with it a high risk of complications and morbidity.    The differential diagnosis includes cellulitis, osteomyelitis, no evidence of abscess, no signs of necrotizing fascitis, no signs of sepsis   Additional history obtained: Additional history obtained from N/A Records reviewed outpatient oncology records  ED Course and Reassessment: On patient's arrival he is hemodynamically stable in no acute distress.  Patient was initially evaluated by triage and had labs performed that showed mild leukocytosis, otherwise no signs of sepsis.  He does have a small ulceration and erythema and warmth to the lower leg concerning for cellulitis.  Will of an x-ray to evaluate for osteo or other deep space infection.  Independent labs interpretation:  The following labs were independently interpreted: Leukocytosis otherwise within normal range  Independent visualization of imaging: - I independently visualized the following imaging with scope of interpretation limited to determining acute life threatening conditions related to emergency care: X-ray tib-fib, which revealed no  signs of deep space infection  Consultation: - Consulted or discussed management/test interpretation w/ external professional: N/A  Consideration for admission or further workup: Patient has no emergent conditions requiring admission or further work-up at this time and is stable for discharge home with primary care follow-up  Social Determinants of health: N/A    Amount and/or Complexity of Data Reviewed Labs: ordered. Radiology: ordered.  Risk Prescription drug management.          Final Clinical Impression(s) / ED Diagnoses Final diagnoses:  Cellulitis of left lower extremity    Rx / DC Orders ED Discharge Orders          Ordered    cephALEXin (KEFLEX) 500 MG capsule  4 times daily        04/11/23 1437    doxycycline (VIBRAMYCIN) 100 MG capsule  2 times daily        04/11/23 1437              Rexford Maus, DO 04/11/23 1438

## 2023-04-11 NOTE — ED Triage Notes (Signed)
Pt had a wound on the leg from a previous fall. Pt states that it was healing well, but in the last 24 hrs sudden redness/swelling around site. Concerned it could be cellulitis.

## 2023-04-14 ENCOUNTER — Encounter (HOSPITAL_COMMUNITY): Payer: Self-pay | Admitting: Licensed Clinical Social Worker

## 2023-04-14 NOTE — Progress Notes (Signed)
Virtual virtual Video Note  I connected with Alex [name] on 04/09/2023 at 300-4:00 PM EST by video-enabled virtual visit. I verified that I am speaking with the correct person using  two identifiers.I discussed the limitations of evaluation and management by telemedicine and the availability of in person appointments. The patient expressed understanding and agreed to proceed.    LOCATION: Patient: Home  Provider: Home office   History of Present Illness:  Pt was referred by Dr. Lolly Mustache for OP therapy for bipolar disorder and anxiety.  Treatment Goal Addressed:  Pt will meet with clinician 1x every 2 weeks for therapy to monitor for progress towards goals and address any barriers to success; Reduce depression from average severity level of 6/10 down to a 4/10 in next 90 days by engaging in 1-2 positive coping skills daily as part of developing self-care routine; Reduce average anxiety level from 7/10 down to 5/10 in next 90 days by utilizing 1-2 relaxation skills/grounding skills per day, such as mindful breathing, progressive muscle relaxation, positive visualizations.  Progress towards Treatment Goal: Progressing   Observations/Objective: Patient presented for today's session on time and was alert, oriented x5, with no evidence or self-report of SI/HI or A/V H.  Patient reported ongoing compliance with medication and denied any use of alcohol or illicit substances.  Clinician inquired about patient's current emotional ratings, as well as any significant changes in thoughts, feelings or behavior since previous session. Patient reported scores of  6/10 for depression, 6/10 for anxiety, 5/10 for anger/irritability. Pt explored his emotional ratings, which remain high and discussed coping skills for stressors [name] suggested pt use mindful based coping skills and practice between sessions. Pt identifies stressors and  allowed pt to explore and express thoughts and feelings associated with recent life  situations and external stressors.[name] and pt reviewed his continued stressors individually: marital issues, son's mental health, physical health, future, pt's sexual health, after prostate cancer.  Pt's stressors have continued for a long period of time. "I bottle everything up and then explode." [name] discussed self-care: what do you do for yourself?  [name] provided education on Self-care which relies on increased self-awareness, which can benefit pt who lives with a mental illness. Practicing self-awareness can help him to recognize patterns in his emotions, including events or situations that can trigger worsened symptoms.      Assessment and plan: Counselor will continue to meet with patient to address treatment plan goals. Patient will continue to follow recommendations of providers and imnd implement skills learned in session, and practice between sessions. Diagnosis: Bipolar 1 disorder.    Collaboration of care: Other:   Continue working with providers    Patient/Guardian was advised Release of Information must be obtained prior to any record release in order to collaborate their care with an outside provider. Patient/Guardian was advised if they have not already done so to contact the registration department to sign all necessary forms in order for [place] to release information regarding their care.    Consent: Patient/Guardian gives verbal consent for treatment and assignment of benefits for services provided during this visit. Patient/Guardian expressed understanding and agreed to proceed.     Follow Up Instructions:  I discussed the assessment and treatment plan with the patient. The patient was provided an opportunity to ask questions and all were answered. The patient agreed with the plan and demonstrated an understanding of the instructions.   The patient was advised to call back or seek an in-person evaluation if the symptoms  worsen or if the condition fails to improve as  anticipated.  I provided 60 minutes of non-face-to-face time during this encounter.   Reiley Bertagnolli S, LCAS  04/09/23

## 2023-04-16 ENCOUNTER — Ambulatory Visit (INDEPENDENT_AMBULATORY_CARE_PROVIDER_SITE_OTHER): Payer: BLUE CROSS/BLUE SHIELD | Admitting: Licensed Clinical Social Worker

## 2023-04-16 ENCOUNTER — Ambulatory Visit: Payer: BLUE CROSS/BLUE SHIELD | Admitting: Internal Medicine

## 2023-04-16 ENCOUNTER — Ambulatory Visit (HOSPITAL_COMMUNITY): Payer: BLUE CROSS/BLUE SHIELD | Admitting: Licensed Clinical Social Worker

## 2023-04-16 DIAGNOSIS — F319 Bipolar disorder, unspecified: Secondary | ICD-10-CM | POA: Diagnosis not present

## 2023-04-17 ENCOUNTER — Ambulatory Visit: Payer: BLUE CROSS/BLUE SHIELD | Admitting: Family Medicine

## 2023-04-17 ENCOUNTER — Encounter: Payer: Self-pay | Admitting: Family Medicine

## 2023-04-17 VITALS — BP 122/80 | HR 94 | Temp 97.6°F | Ht 72.0 in | Wt 177.0 lb

## 2023-04-17 DIAGNOSIS — H401121 Primary open-angle glaucoma, left eye, mild stage: Secondary | ICD-10-CM | POA: Diagnosis not present

## 2023-04-17 DIAGNOSIS — K130 Diseases of lips: Secondary | ICD-10-CM

## 2023-04-17 DIAGNOSIS — L03116 Cellulitis of left lower limb: Secondary | ICD-10-CM

## 2023-04-17 DIAGNOSIS — H4051X3 Glaucoma secondary to other eye disorders, right eye, severe stage: Secondary | ICD-10-CM | POA: Diagnosis not present

## 2023-04-17 DIAGNOSIS — H25812 Combined forms of age-related cataract, left eye: Secondary | ICD-10-CM | POA: Diagnosis not present

## 2023-04-17 DIAGNOSIS — S00531A Contusion of lip, initial encounter: Secondary | ICD-10-CM | POA: Insufficient documentation

## 2023-04-17 DIAGNOSIS — H3589 Other specified retinal disorders: Secondary | ICD-10-CM | POA: Diagnosis not present

## 2023-04-17 NOTE — Patient Instructions (Signed)
Continue taking the antibiotics prescribed by the emergency department.   We will hold off on wound care referral since you are improving.   Let us know if you develop any worsening redness, drainage, swelling or pain (all signs of infection).   PLEASE CALL YOUR DERMATOLOGIST TODAY TO BE SEEN FOR THE LIP LESION

## 2023-04-17 NOTE — Progress Notes (Signed)
Subjective:     Patient ID: Alex [name], male    DOB: April 19, 1956, 67 y.o.   MRN: [id number]  Chief Complaint  Patient presents with   Cellulitis    Left leg cellulitis, currently on abx and wants it looked at. (Was an ulcer that turned into cellulitis). Not sure if needs referral to wound care.    Mass    Mass on lip developed overnight    HPI  Discussed the use of AI scribe software for clinical note transcription with the patient, who gave verbal consent to proceed.  History of Present Illness         Here to follow up on LLE cellulitis. Evaluated in the ED on 04/11/2023.   Currently on Doxycycline and Keflex. Reports significant improvement in redness and ulceration. Denies pain, drainage, fever, chills.   C/o new lip lesion since this morning. Denies trauma. Hx of skin cancer and has a dermatologist.     Narrative & Impression  CLINICAL DATA:  Deep wound infection   EXAM: LEFT TIBIA AND FIBULA - 2 VIEW   COMPARISON:  None Available.   FINDINGS: No fracture or dislocation. Preserved joint spaces and bone mineralization. Soft tissue swelling about the ankle. On the lateral view there is small lucency in the soft tissues anterior to the mid lower leg. This appears to be slightly lateral on the AP view. Please correlate for soft tissue abnormality. Overall if there is concern of bone infection, MRI or bone scan is recommended for further delineation.   IMPRESSION: No acute osseous abnormality. Small soft tissue lucency along the anterolateral lower leg. Please correlate with exact location of abnormality.     Electronically Signed   By: Karen Kays [name]   On: 04/11/2023 12:42      Health Maintenance Due  Topic Date Due   FOOT EXAM  Never done   OPHTHALMOLOGY EXAM  Never done   Diabetic kidney evaluation - Urine ACR  Never done   Hepatitis C Screening  Never done   HEMOGLOBIN A1C  03/30/2019   DTaP/Tdap/Td (2 - Tdap) 04/10/2023   Pneumonia  Vaccine 42+ Years old (3 of 3 - PPSV23 or PCV20) 04/02/2023    Past Medical History:  Diagnosis Date   Anemia    Anxiety    Atypical nevus 04/12/1997   dyplastic-left chest below nipple   Atypical nevus 01/18/2005   slight-mod-mid upper abd, slight-mod-right lateral chest-(WS), slight-mod-mid lower back (punch)   Atypical nevus 05/31/2005   dysplastic-central lowerback (exc), dysplastic- right abdomen (Exc)   Basal cell carcinoma 06/04/2016   back of neck   Bipolar I disorder (HCC)    Bleeding ulcer 2016   BPH (benign prostatic hypertrophy) with urinary obstruction    Cancer (HCC)    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   Cataract    LEFT EYE   Chronic back pain    Complication of anesthesia POST URINARY RETENTION---  2006 SHOULDER SURGERY MARKED BRADYCARDIA VAGAL RESPONSE NO ISSUE W/ SURGERY AFTER THIS ONE   WITH GENERAL ANESTHESIA, 15 YRS AGO VASOVAGAL REACTION NONE SINCE   Corneal hemorrhage 06/03/2018   Entire left eye   Coronary atherosclerosis CARDIOLOGIST- DR CRENSHAW--  LAST VISIT 01-05-2012 IN EPIC   NON-OBSTRUCTIVE MILD DISEASE   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HCC)    Depression    Epicondylitis    right elbow   GERD (gastroesophageal reflux disease)    Glaucoma BOTH EYES   RIGHT EYE RADIATION DAMAGE  Hearing loss    BOTH EARS   Hearing loss    Bilateral   Hepatic cyst    Several, The lesion of concern in segment 6 of the liver has single large portal vein and hepatic vein branches extending to tt, in a pattern of enhancement which mirrors these vascular structures. 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 9 CM X 4. 3 CM PER NOV 06-14-17  CHEST CTFOLOWED BY DR Jens Som YEARLY FOR   Positional vertigo    HX OF WITH SINUS INFECTIONS   Radial head fracture    Right   Squamous cell carcinoma of skin 06/04/2016   in situ-crown of scalp   Squamous cell carcinoma of skin 04/22/2017   in situ-crown scalp (txpbx)   Thoracic aortic aneurysm (HCC) 06/14/2017   last CT 4.1 CM Mild   Tinnitus    CONSTANT   Ulnar nerve compression    right elbow   Unsteady gait    especially with stairs, depth perception off   Urinary hesitancy    Wears glasses     Past Surgical History:  Procedure Laterality Date   CARDIAC CATHETERIZATION  01-16-2006  DR Maisie Fus WALL   MILD CORONARY ATHEROSCLEROSIS/ MID TO DISTAL LAD 40% STENOSIS/ LVF 50-55%   CARPAL TUNNEL RELEASE Right 11/03/2017   Procedure: RIGHT HAND CARPAL TUNNEL RELEASE;  Surgeon: Bradly Bienenstock, MD;  Location[facility];  Service: Orthopedics;  Laterality: Right;   CATARACT EXTRACTION Right    COLONSCOPY  2017 LAST DONE   MULTIPLE   ENDOSCOPY  LAST 2017   MULTIPLE DONE DILATION DONE ALSO   ESOPHAGOGASTRODUODENOSCOPY (EGD) WITH PROPOFOL N/A 02/24/2018   Procedure: ESOPHAGOGASTRODUODENOSCOPY (EGD) WITH PROPOFOL;  Surgeon: Carman Ching, MD;  Location: [facility];  Service: Endoscopy;  Laterality: N/A;   EXCISION RADIAL HEAD Right 11/03/2017   Procedure: RIGHT PROXIMAL RADIUS RADIAL HEAD RESECTION AND JOINT DEBRIDEMENT;  Surgeon: Bradly Bienenstock, MD;   Location: [facility];  Service: Orthopedics;  Laterality: Right;   EXTRACORPOREAL SHOCK WAVE LITHOTRIPSY Right 11/20/2020   Procedure: EXTRACORPOREAL SHOCK WAVE LITHOTRIPSY (ESWL);  Surgeon: Sebastian Ache, MD;  Location: [facility];  Service: Urology;  Laterality: Right;  75 MINS   KNEE ARTHROSCOPY  05/01/2012   Procedure: ARTHROSCOPY KNEE;  Surgeon: Javier Docker, MD;  Location: [facility];  Service: Orthopedics;  Laterality: Left;  debridement and removal of loose body   LEFT ANKLE ARTHROSCOPY W/ DEBRIDEMENT  05-12-2007   LEFT HYDROCELECTOMY  03-29-2005   AND REPAIR LEFT INGUINAL HERNIA W/ MESH   MOHS SURGERY  2002   RIGHT ORBITAL CANCER   NASAL ENDOSCOPY  08-07-2005   RIGHT EPISTAXIS  / POST SEPTOPLASTY  (HX RIGHT ORBITAL CA & S/P RADIATION/ NECROSIS ANTERIOR END OF BOTH INFERIOR TURBINATES)   occuloplastic surgery  2002   PARS PLANA VITRECTOMY  11-06-2004   RIGHT EYE RADIATION RETINOPATHY W/ HEMORRHAGE   RADIAL HEAD ARTHROPLASTY Right 06/15/2018   Procedure: RIGHT ELBOW PROXIMAL RADIOULNAR JOINT DEBRIDEMENT AND ARTHROPLASTY;  Surgeon: Bradly Bienenstock, MD;  Location: [facility];  Service: Orthopedics;  Laterality: Right;  BLOCK WITH SEDATION   REPAIR UNDESENDED RIGHT TESTICLE / RIGHT INGUINAL HERNIA  AGE 65   RIGHT ANKLE ARTHROSCOPY W/ EXTENSIVE DEBRIDEMENT  04/05/2008   x2   RIGHT SHOULDER SURGERY  2006   RIGHT SUPRAOMOHYOID NECK DISSECTION   03-08-2003   ZONES 1,2,3;   SUBMANDIBULAR MASS / METASTATIC SQUAMOUS CELL CARCINOMA RIGHT NECK   SAVORY DILATION N/A 02/24/2018   Procedure: SAVORY DILATION;  Surgeon: Carman Ching, MD;  Location: [facility];  Service: Endoscopy;  Laterality: N/A;   SEPTOPLASTY  NOV 2006   SHOULDER ARTHROSCOPY Left    SHOULDER ARTHROSCOPY W/ SUBACROMIAL DECOMPRESSION AND DISTAL CLAVICLE EXCISION  10-09-2008   AND DEBRIDEMENT OF RIGHT SHOULDER IMPINGEMENT & [facility] JOINT ARTHRITIS   SPINE SURGERY  2016    l 3 TO l 4 PLATE AND SCREWS   TOTAL THYROIDECTOMY  11-03-2001   PAPILLARY THYROID CARCINOMA   TRANSTHORACIC ECHOCARDIOGRAM  12/ 2012   grade I diastolic dysfunction/ ef 55-60%   ULNAR NERVE TRANSPOSITION Right 04/28/2014   Procedure: RIGHT ELBOW ULNA NERVE RELEASE TRANSPOSTION AND MEDIAL EPICONDYLAR DEBRIDEMENT AND REPAIR;  Surgeon: Sharma Covert, MD;  Location: [facility];  Service: Orthopedics;  Laterality: Right;    Family History  Problem Relation Age of Onset   Depression Sister    Rectal cancer Sister    Lung cancer Brother    Kidney disease Mother    Depression Mother    Stroke Father    COPD Father    Hypertension Father    Prostate cancer Father    Depression Daughter    Drug abuse Daughter    Psychiatric Illness Son     Social History   Socioeconomic History   Marital status: Married    Spouse name: Not on file   Number of children: Not on file   Years of education: Not on file   Highest education level: Not on file  Occupational History   Not on file  Tobacco Use   Smoking status: Never   Smokeless tobacco: Never  Vaping Use   Vaping status: Never Used  Substance and Sexual Activity   Alcohol use: Yes    Alcohol/week: 0.0 standard drinks of alcohol    Comment: 1-2 glasses per month   Drug use: No   Sexual activity: Yes    Partners: Female    Birth control/protection: None  Other Topics Concern   Not on file  Social History Narrative   Originally from [place]. Moved to [place] in 1984. 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 No international travel other than [place]. Has a dog at home, poodle mix. His daughter who lives with him now has a dog. No mold exposure.  No bird exposure. No hot tub exposure. Enjoys gardening.    Social Determinants of Health   Financial Resource Strain: Not on file  Food  Insecurity: No Food Insecurity (08/15/2021)   Received from [facility]   Hunger Vital Sign    Worried About Running Out of Food in the Last Year: Never true    Ran Out of Food in the Last Year: Never true  Transportation Needs: Not on file  Physical Activity: Not on file  Stress: Not on file  Social Connections: Unknown (12/19/2021)   Received from [facility]   Social Network    Social Network: Not on file  Intimate Partner Violence: Unknown (11/14/2021)   Received from [facility]   HITS    Physically Hurt: Not on file    Insult or Talk Down To: Not on file    Threaten Physical Harm: Not on file    Scream or Curse: Not on file    Outpatient Medications Prior to Visit  Medication Sig Dispense Refill   acetaminophen (TYLENOL) 500 MG tablet Take by mouth.     albuterol (VENTOLIN HFA) 108 (90 Base) MCG/ACT inhaler Inhale 2 puffs into the lungs every 6 (six) hours as needed for wheezing or shortness of breath. 8 g 0   amLODipine (NORVASC) 5 MG tablet Take 1 tablet (5 mg total) by mouth daily. 90 tablet 3   aspirin EC 81 MG tablet Take 1 tablet by mouth daily.     atorvastatin (LIPITOR) 40 MG tablet TAKE 1 TABLET BY MOUTH EVERY DAY 90 tablet 1   AXIRON 30 MG/ACT SOLN Place 2 Act onto the skin daily.     brimonidine (ALPHAGAN) 0.2 % ophthalmic solution SMARTSIG:In Eye(s)     cephALEXin (KEFLEX) 500 MG capsule Take 1 capsule (500 mg total) by mouth 4 (four) times daily for 7 days. 28 capsule 0   Coenzyme Q10 (CO Q 10 PO) Take 1 capsule by mouth daily.      doxycycline (VIBRAMYCIN) 100 MG capsule Take 1 capsule (100 mg total) by mouth 2 (two) times daily for 7 days. 14 capsule 0   EPINEPHrine 0.3 mg/0.3 mL IJ SOAJ injection Inject 0.3 mg into the muscle as needed.     erythromycin ophthalmic ointment Place into the right eye at bedtime.     Immune Globulin, Human, 4 GM/20ML SOLN Inject into the skin once a week. wednesday     LamoTRIgine 300  MG TB24 24 hour tablet Take 1 tablet (300 mg total) by mouth every morning. 90 tablet 0   latanoprost (XALATAN) 0.005 % ophthalmic solution 1 DROP EACH EYE AT NIGHT     levothyroxine (SYNTHROID) 150 MCG tablet Take 1 tablet (150 mcg total) by mouth daily before breakfast. 90 tablet 2   LORazepam (ATIVAN) 0.5 MG tablet Take one tab daily and 2nd if needed for anxiety 45 tablet 0   meloxicam (MOBIC) 15 MG tablet TAKE 1 TABLET BY MOUTH EVERY DAY AS NEEDED 90 tablet 0   Multiple Vitamin (MULTI-VITAMINS) TABS Take by mouth.     OMEGA-3 KRILL OIL PO Take 1 capsule by mouth daily.      omeprazole (PRILOSEC) 20 MG capsule Take 20 mg by mouth every evening.     sildenafil (REVATIO) 20 MG tablet Take 1 tablet (20 mg total) by mouth as needed. 30 tablet 1   tadalafil (CIALIS) 5 MG tablet Take 5 mg by mouth.     tamsulosin (FLOMAX)  0.4 MG CAPS capsule TAKE 1 CAPSULE BY MOUTH EVERY DAY (Patient taking differently: as needed.) 90 capsule 1   telmisartan (MICARDIS) 80 MG tablet Take 1 tablet (80 mg total) by mouth daily. 90 tablet 1   timolol (TIMOPTIC) 0.5 % ophthalmic solution timolol maleate 0.5 % eye drops  INSTILL 1 DROP INTO RIGHT EYE TWICE A DAY     traMADol (ULTRAM) 50 MG tablet Take 50 mg by mouth as needed.     triamcinolone ointment (KENALOG) 0.5 % Apply 1 Application topically 4 (four) times daily. On the lip 30 g 0   zolpidem (AMBIEN) 10 MG tablet TAKE 1/2 (ONE-HALF) TABLET BY MOUTH AT BEDTIME AND  TAKE  THE  OTHER  HALF  IF  NEEDED 30 tablet 0   predniSONE (DELTASONE) 10 MG tablet Take 4 tabs po qd x 3 days, then 3 tabs po qd x 3 days, then 2 tabs po qd x 3 days, then 1 tab po qd x 3 days (Patient not taking: Reported on 04/17/2023) 30 tablet 0   No facility-administered medications prior to visit.    Allergies  Allergen Reactions   Percocet [Oxycodone-Acetaminophen] Itching    Can take generic.  States only has a problem with percocet brand, also headache    Review of Systems   Constitutional:  Negative for chills and fever.  Respiratory:  Negative for shortness of breath.   Cardiovascular:  Positive for leg swelling. Negative for chest pain and palpitations.  Gastrointestinal:  Negative for abdominal pain, constipation, diarrhea, nausea and vomiting.  Neurological:  Negative for dizziness and focal weakness.       Objective:    Physical Exam Constitutional:      General: He is not in acute distress.    Appearance: He is not ill-appearing.  HENT:     Mouth/Throat:     Comments: Left lower lip with purplish discoloration  Eyes:     Extraocular Movements: Extraocular movements intact.     Conjunctiva/sclera: Conjunctivae normal.  Cardiovascular:     Rate and Rhythm: Normal rate.  Pulmonary:     Effort: Pulmonary effort is normal.  Musculoskeletal:     Cervical back: Normal range of motion and neck supple.  Skin:    General: Skin is warm and dry.     Capillary Refill: Capillary refill takes less than 2 seconds.     Comments: Non pitting edema of left foot and LE to mid shin. Healing 1 cm ulcer of anterior shin. No drainage. No surrounding erythema. Non tender. See pics from 04/11/2023 for comparison  Neurological:     General: No focal deficit present.     Mental Status: He is alert and oriented to person, place, and time.     Sensory: No sensory deficit.     Motor: No weakness.     Coordination: Coordination normal.     Gait: Gait normal.  Psychiatric:        Mood and Affect: Mood normal.        Behavior: Behavior normal.        Thought Content: Thought content normal.      BP 122/80 (BP Location: Left Arm, Patient Position: Sitting, Cuff Size: Large)   Pulse 94   Temp 97.6 F (36.4 C) (Temporal)   Ht 6' (1.829 m)   Wt 177 lb (80.3 kg)   SpO2 98%   BMI 24.01 kg/m  Wt Readings from Last 3 Encounters:  04/17/23 177 lb (80.3 kg)  04/11/23 170  lb (77.1 kg)  04/01/23 176 lb 9.6 oz (80.1 kg)         Assessment & Plan:   Problem  List Items Addressed This Visit   None Visit Diagnoses     Cellulitis of left lower extremity    -  Primary   Lesion of lip          Reviewed notes and results from ED.  LLE improving since ED visit and pictures from 2 days ago on his phone.  Continue antibiotics. Discussed wound care referral and will hold off for now. He will let us know if any new or worsening signs of infections.  Unclear etiology for lip lesion which he noticed this morning. Appearance consistent with bruise without any recollection of trauma. He will call his dermatologist due to hx of skin cancers. Follow up with PCP in the next 2-3 weeks.   I have discontinued Judie Bonus. Alex [name] predniSONE. I am also having him maintain his Immune Globulin (Human), Coenzyme Q10 (CO Q 10 PO), OMEGA-3 KRILL OIL PO, Axiron, aspirin EC, latanoprost, omeprazole, Multi-Vitamins, tamsulosin, timolol, meloxicam, tadalafil, acetaminophen, brimonidine, EPINEPHrine, erythromycin, traMADol, triamcinolone ointment, levothyroxine, atorvastatin, amLODipine, LamoTRIgine, telmisartan, albuterol, sildenafil, LORazepam, zolpidem, cephALEXin, and doxycycline.  No orders of the defined types were placed in this encounter.

## 2023-04-18 DIAGNOSIS — N200 Calculus of kidney: Secondary | ICD-10-CM | POA: Diagnosis not present

## 2023-04-21 DIAGNOSIS — D040 Carcinoma in situ of skin of lip: Secondary | ICD-10-CM | POA: Diagnosis not present

## 2023-04-21 DIAGNOSIS — L218 Other seborrheic dermatitis: Secondary | ICD-10-CM | POA: Diagnosis not present

## 2023-04-21 DIAGNOSIS — D0439 Carcinoma in situ of skin of other parts of face: Secondary | ICD-10-CM | POA: Diagnosis not present

## 2023-04-21 DIAGNOSIS — X32XXXD Exposure to sunlight, subsequent encounter: Secondary | ICD-10-CM | POA: Diagnosis not present

## 2023-04-21 DIAGNOSIS — L570 Actinic keratosis: Secondary | ICD-10-CM | POA: Diagnosis not present

## 2023-04-22 ENCOUNTER — Telehealth (HOSPITAL_BASED_OUTPATIENT_CLINIC_OR_DEPARTMENT_OTHER): Payer: BLUE CROSS/BLUE SHIELD | Admitting: Psychiatry

## 2023-04-22 ENCOUNTER — Telehealth (HOSPITAL_COMMUNITY): Payer: BLUE CROSS/BLUE SHIELD | Admitting: Psychiatry

## 2023-04-22 ENCOUNTER — Encounter (HOSPITAL_COMMUNITY): Payer: Self-pay | Admitting: Psychiatry

## 2023-04-22 ENCOUNTER — Ambulatory Visit (HOSPITAL_COMMUNITY): Payer: BLUE CROSS/BLUE SHIELD | Admitting: Licensed Clinical Social Worker

## 2023-04-22 ENCOUNTER — Encounter (HOSPITAL_COMMUNITY): Payer: Self-pay

## 2023-04-22 VITALS — Wt 177.0 lb

## 2023-04-22 DIAGNOSIS — F319 Bipolar disorder, unspecified: Secondary | ICD-10-CM

## 2023-04-22 DIAGNOSIS — F5101 Primary insomnia: Secondary | ICD-10-CM

## 2023-04-22 DIAGNOSIS — F411 Generalized anxiety disorder: Secondary | ICD-10-CM

## 2023-04-22 MED ORDER — LAMOTRIGINE ER 300 MG PO TB24
1.0000 | ORAL_TABLET | ORAL | 0 refills | Status: DC
Start: 2023-04-22 — End: 2023-07-02

## 2023-04-22 MED ORDER — ZOLPIDEM TARTRATE 10 MG PO TABS
ORAL_TABLET | ORAL | 0 refills | Status: DC
Start: 2023-04-22 — End: 2023-07-02

## 2023-04-22 MED ORDER — BUSPIRONE HCL 5 MG PO TABS: 5.0000 mg | ORAL_TABLET | Freq: Every day | ORAL | 0 refills | Status: DC

## 2023-04-22 NOTE — Progress Notes (Signed)
[facility] MD Virtual Progress Note   Patient Location: Home Provider Location: Home Office  I connect with patient by telephone and verified that I am speaking with correct person by using two identifiers. I discussed the limitations of evaluation and management by telemedicine and the availability of in person appointments. I also discussed with the patient that there may be a patient responsible charge related to this service. The patient expressed understanding and agreed to proceed.  Alex [name] [id number] 67 y.o.  04/22/2023 8:35 AM  History of Present Illness:  Patient is evaluated by phone session.  A lot of anxiety due to his physical health.  He recently given the diagnosis of cellulitis and also he had a lesion on his lip.  He is seeing dermatology and taking antibiotic for cellulitis.  He reported sleep has been an issue.  Despite taking Ativan and Ambien some nights he can sleep very well.  He is taking Ambien 5 mg and sometimes he takes the extra half when he cannot sleep.  He is also seeing Dr. At [facility] for his prostate cancer.  He reported son is currently stable and he is hoping to move out in 2 weeks to have his own place.  Patient told summer was very busy as traveled to [place] and then [place] to see his daughter who had a baby 7 weeks ago.  Patient other daughter who lives in [place] had a baby on Saturday.  He is excited about having 2 grandkids in less than 8 weeks.  Denies any mania, psychosis, irritability, suicidal thoughts or homicidal thoughts.  However his anxiety and sleep has been biggest issue.  He is seeing multiple providers for his multiple health issues.  He has no tremors, shakes or any EPS.  His appetite is fair.  His weight is stable.  Past Psychiatric History: H/O depression, mood swing, anger and Inpatient at [facility] due to suicidal thoughts but no attempt.  No h/o psychosis but h/o poor impulse control.  Tried Klonopin but did not work.   Took Seroquel, Cymbalta and Remeron (increase depression)     Outpatient Encounter Medications as of 04/22/2023  Medication Sig   acetaminophen (TYLENOL) 500 MG tablet Take by mouth.   albuterol (VENTOLIN HFA) 108 (90 Base) MCG/ACT inhaler Inhale 2 puffs into the lungs every 6 (six) hours as needed for wheezing or shortness of breath.   amLODipine (NORVASC) 5 MG tablet Take 1 tablet (5 mg total) by mouth daily.   aspirin EC 81 MG tablet Take 1 tablet by mouth daily.   atorvastatin (LIPITOR) 40 MG tablet TAKE 1 TABLET BY MOUTH EVERY DAY   AXIRON 30 MG/ACT SOLN Place 2 Act onto the skin daily.   brimonidine (ALPHAGAN) 0.2 % ophthalmic solution SMARTSIG:In Eye(s)   Coenzyme Q10 (CO Q 10 PO) Take 1 capsule by mouth daily.    EPINEPHrine 0.3 mg/0.3 mL IJ SOAJ injection Inject 0.3 mg into the muscle as needed.   erythromycin ophthalmic ointment Place into the right eye at bedtime.   Immune Globulin, Human, 4 GM/20ML SOLN Inject into the skin once a week. wednesday   LamoTRIgine 300 MG TB24 24 hour tablet Take 1 tablet (300 mg total) by mouth every morning.   latanoprost (XALATAN) 0.005 % ophthalmic solution 1 DROP EACH EYE AT NIGHT   levothyroxine (SYNTHROID) 150 MCG tablet Take 1 tablet (150 mcg total) by mouth daily before breakfast.   LORazepam (ATIVAN) 0.5 MG tablet Take one tab daily and 2nd  if needed for anxiety   meloxicam (MOBIC) 15 MG tablet TAKE 1 TABLET BY MOUTH EVERY DAY AS NEEDED   Multiple Vitamin (MULTI-VITAMINS) TABS Take by mouth.   OMEGA-3 KRILL OIL PO Take 1 capsule by mouth daily.    omeprazole (PRILOSEC) 20 MG capsule Take 20 mg by mouth every evening.   sildenafil (REVATIO) 20 MG tablet Take 1 tablet (20 mg total) by mouth as needed.   tadalafil (CIALIS) 5 MG tablet Take 5 mg by mouth.   tamsulosin (FLOMAX) 0.4 MG CAPS capsule TAKE 1 CAPSULE BY MOUTH EVERY DAY (Patient taking differently: as needed.)   telmisartan (MICARDIS) 80 MG tablet Take 1 tablet (80 mg total) by mouth  daily.   timolol (TIMOPTIC) 0.5 % ophthalmic solution timolol maleate 0.5 % eye drops  INSTILL 1 DROP INTO RIGHT EYE TWICE A DAY   traMADol (ULTRAM) 50 MG tablet Take 50 mg by mouth as needed.   triamcinolone ointment (KENALOG) 0.5 % Apply 1 Application topically 4 (four) times daily. On the lip   zolpidem (AMBIEN) 10 MG tablet TAKE 1/2 (ONE-HALF) TABLET BY MOUTH AT BEDTIME AND  TAKE  THE  OTHER  HALF  IF  NEEDED   No facility-administered encounter medications on file as of 04/22/2023.    Recent Results (from the past 2160 hour(s))  Basic metabolic panel     Status: None   Collection Time: 03/12/23 12:00 PM  Result Value Ref Range   Glucose 81 70 - 99 mg/dL   BUN 19 8 - 27 mg/dL   Creatinine, Ser 9.52 0.76 - 1.27 mg/dL   eGFR 83 >84 XL/KGM/0.10   BUN/Creatinine Ratio 19 10 - 24   Sodium 142 134 - 144 mmol/L   Potassium 4.0 3.5 - 5.2 mmol/L   Chloride 103 96 - 106 mmol/L   CO2 24 20 - 29 mmol/L   Calcium 8.9 8.6 - 10.2 mg/dL  IgG, IgA, IgM     Status: Abnormal   Collection Time: 03/31/23  2:42 PM  Result Value Ref Range   IgG (Immunoglobin G), Serum 810 603 - 1,613 mg/dL   IgA 75 61 - 272 mg/dL   IgM (Immunoglobulin M), Srm 18 (L) 20 - 172 mg/dL    Comment: (NOTE) Result confirmed on concentration. Performed At: [facility] [address] [place], [name] [id number] Jolene Schimke MD VQ:[id number]   Kappa/lambda light chains     Status: None   Collection Time: 03/31/23  2:42 PM  Result Value Ref Range   Kappa free light chain 11.8 3.3 - 19.4 mg/L   Lambda free light chains 9.1 5.7 - 26.3 mg/L   Kappa, lambda light chain ratio 1.30 0.26 - 1.65    Comment: (NOTE) Performed At: [facility] [address] [place], [name] [id number] Jolene Schimke MD CZ:[id number]   Lactate dehydrogenase     Status: Abnormal   Collection Time: 03/31/23  2:42 PM  Result Value Ref Range   LDH 231 (H) 98 - 192 U/L    Comment: Performed at [facility] at  [facility], [address] [place], [name] [address]  Serum protein electrophoresis with reflex     Status: None   Collection Time: 03/31/23  2:42 PM  Result Value Ref Range   Total Protein ELP 6.4 6.0 - 8.5 g/dL   Albumin ELP 3.7 2.9 - 4.4 g/dL   FTDDU-2-GURKYHCW 0.3 0.0 - 0.4 g/dL   CBJSE-8-BTDVVOHY 0.7 0.4 - 1.0 g/dL  Beta Globulin 1.0 0.7 - 1.3 g/dL   Gamma Globulin 0.7 0.4 - 1.8 g/dL   M-Spike, % Not Observed Not Observed g/dL   Globulin, Total 2.7 2.2 - 3.9 g/dL   A/G Ratio 1.4 0.7 - 1.7   Comment Comment     Comment: (NOTE) Protein electrophoresis scan will follow via computer, mail, or courier delivery.    SPEP Interpretation Comment     Comment: (NOTE) The SPE pattern appears unremarkable. Evidence of monoclonal protein is not apparent. Performed At: [facility] [address] [place], [name] [id number] Jolene Schimke MD EX:[id number]   CBC with Differential (Cancer Center Only)     Status: Abnormal   Collection Time: 03/31/23  2:42 PM  Result Value Ref Range   WBC Count 10.8 (H) 4.0 - 10.5 K/uL   RBC 5.12 4.22 - 5.81 MIL/uL   Hemoglobin 14.2 13.0 - 17.0 g/dL   HCT 10.2 72.5 - 36.6 %   MCV 83.2 80.0 - 100.0 fL   MCH 27.7 26.0 - 34.0 pg   MCHC 33.3 30.0 - 36.0 g/dL   RDW 44.0 (H) 34.7 - 42.5 %   Platelet Count 254 150 - 400 K/uL   nRBC 0.0 0.0 - 0.2 %   Neutrophils Relative % 81 %   Neutro Abs 8.8 (H) 1.7 - 7.7 K/uL   Lymphocytes Relative 9 %   Lymphs Abs 1.0 0.7 - 4.0 K/uL   Monocytes Relative 5 %   Monocytes Absolute 0.5 0.1 - 1.0 K/uL   Eosinophils Relative 2 %   Eosinophils Absolute 0.2 0.0 - 0.5 K/uL   Basophils Relative 1 %   Basophils Absolute 0.1 0.0 - 0.1 K/uL   Immature Granulocytes 2 %   Abs Immature Granulocytes 0.24 (H) 0.00 - 0.07 K/uL    Comment: Performed at [facility] at [facility], [address] [place], [name] [address]  CMP (Cancer Center only)     Status: Abnormal   Collection  Time: 03/31/23  2:42 PM  Result Value Ref Range   Sodium 137 135 - 145 mmol/L   Potassium 4.3 3.5 - 5.1 mmol/L   Chloride 103 98 - 111 mmol/L   CO2 25 22 - 32 mmol/L   Glucose, Bld 133 (H) 70 - 99 mg/dL    Comment: Glucose reference range applies only to samples taken after fasting for at least 8 hours.   BUN 23 8 - 23 mg/dL   Creatinine 7.56 4.33 - 1.24 mg/dL   Calcium 8.6 (L) 8.9 - 10.3 mg/dL   Total Protein 6.7 6.5 - 8.1 g/dL   Albumin 4.4 3.5 - 5.0 g/dL   AST 20 15 - 41 U/L   ALT 28 0 - 44 U/L   Alkaline Phosphatase 78 38 - 126 U/L   Total Bilirubin 0.5 0.3 - 1.2 mg/dL   GFR, Estimated >29 >51 mL/min    Comment: (NOTE) Calculated using the CKD-EPI Creatinine Equation (2021)    Anion gap 9 5 - 15    Comment: Performed at [facility] at [facility], [address], [place], [name] [address]  Ferritin     Status: None   Collection Time: 03/31/23  2:42 PM  Result Value Ref Range   Ferritin 106 24 - 336 ng/mL    Comment: Performed at [facility], [address] [place], [name] [address]  Reticulocytes     Status: None   Collection Time:  03/31/23  2:42 PM  Result Value Ref Range   Retic Ct Pct 2.1 0.4 - 3.1 %   RBC. 5.17 4.22 - 5.81 MIL/uL   Retic Count, Absolute 110.1 19.0 - 186.0 K/uL   Immature Retic Fract 12.4 2.3 - 15.9 %    Comment: Performed at [facility] at [facility], [address] [place], [name] [address]  Iron and Iron Binding Capacity ([facility] only)     Status: None   Collection Time: 03/31/23  2:42 PM  Result Value Ref Range   Iron 109 45 - 182 ug/dL   TIBC 914 782 - 956 ug/dL   Saturation Ratios 25 17.9 - 39.5 %   UIBC 326 117 - 376 ug/dL    Comment: Performed at [facility], [address], [place], [name] [address]  PSA, total and free     Status: None   Collection Time: 03/31/23  2:42 PM  Result Value Ref Range   PSA, Free 0.14 N/A ng/mL     Comment: Roche ECLIA methodology.   PSA, Free Pct 12.7 %    Comment: (NOTE) The table below lists the probability of prostate cancer for men with non-suspicious DRE results and total PSA between 4 and 10 ng/mL, by patient age Damaris Schooner, JAMA 1998, 657:8469).                  % Free PSA       50-64 yr        65-75 yr                  0.00-10.00%        56%             55%                 10.01-15.00%        24%             35%                 15.01-20.00%        17%             23%                 20.01-25.00%        10%             20%                      >25.00%         5%              9% Please note:  Catalona et al did not make specific              recommendations regarding the use of              percent free PSA for any other population              of men. Performed At: [facility] [address] [place], [name] [id number] Jolene Schimke MD KG:[id number]    Prostate Specific Ag, Serum 1.1 0.0 - 4.0 ng/mL    Comment: (NOTE) Roche ECLIA methodology. According to the American Urological Association, Serum PSA should decrease and remain at undetectable levels after radical prostatectomy. The AUA defines biochemical recurrence as an initial PSA value 0.2 ng/mL or greater followed by a subsequent  confirmatory PSA value 0.2 ng/mL or greater. Values obtained with different assay methods or kits cannot be used interchangeably. Results cannot be interpreted as absolute evidence of the presence or absence of malignant disease.   Comprehensive metabolic panel     Status: Abnormal   Collection Time: 04/11/23 10:56 AM  Result Value Ref Range   Sodium 135 135 - 145 mmol/L   Potassium 3.9 3.5 - 5.1 mmol/L   Chloride 105 98 - 111 mmol/L   CO2 21 (L) 22 - 32 mmol/L   Glucose, Bld 119 (H) 70 - 99 mg/dL    Comment: Glucose reference range applies only to samples taken after fasting for at least 8 hours.   BUN 27 (H) 8 - 23 mg/dL   Creatinine, Ser 1.69 0.61 - 1.24 mg/dL    Calcium 8.4 (L) 8.9 - 10.3 mg/dL   Total Protein 6.8 6.5 - 8.1 g/dL   Albumin 3.9 3.5 - 5.0 g/dL   AST 23 15 - 41 U/L   ALT 37 0 - 44 U/L   Alkaline Phosphatase 70 38 - 126 U/L   Total Bilirubin 0.9 0.3 - 1.2 mg/dL   GFR, Estimated >67 >89 mL/min    Comment: (NOTE) Calculated using the CKD-EPI Creatinine Equation (2021)    Anion gap 9 5 - 15    Comment: Performed at [facility], [address] [place], [name] [address]  CBC with Differential     Status: Abnormal   Collection Time: 04/11/23 10:56 AM  Result Value Ref Range   WBC 12.6 (H) 4.0 - 10.5 K/uL   RBC 5.23 4.22 - 5.81 MIL/uL   Hemoglobin 14.5 13.0 - 17.0 g/dL   HCT 75.1 02.5 - 85.2 %   MCV 84.1 80.0 - 100.0 fL   MCH 27.7 26.0 - 34.0 pg   MCHC 33.0 30.0 - 36.0 g/dL   RDW 77.8 (H) 24.2 - 35.3 %   Platelets 186 150 - 400 K/uL   nRBC 0.0 0.0 - 0.2 %   Neutrophils Relative % 71 %   Neutro Abs 9.1 (H) 1.7 - 7.7 K/uL   Lymphocytes Relative 14 %   Lymphs Abs 1.7 0.7 - 4.0 K/uL   Monocytes Relative 11 %   Monocytes Absolute 1.4 (H) 0.1 - 1.0 K/uL   Eosinophils Relative 2 %   Eosinophils Absolute 0.3 0.0 - 0.5 K/uL   Basophils Relative 1 %   Basophils Absolute 0.1 0.0 - 0.1 K/uL   Immature Granulocytes 1 %   Abs Immature Granulocytes 0.13 (H) 0.00 - 0.07 K/uL    Comment: Performed at [facility], [address] [place], [name] [address]     Psychiatric Specialty Exam: Physical Exam  Review of Systems  Weight 177 lb (80.3 kg).There is no height or weight on file to calculate BMI.  General Appearance: NA  Eye Contact:  NA  Speech:  Slow  Volume:  Decreased  Mood:  Anxious  Affect:  NA  Thought Process:  Goal Directed  Orientation:  Full (Time, Place, and Person)  Thought Content:  Rumination  Suicidal Thoughts:  No  Homicidal Thoughts:  No  Memory:  Immediate;   Good Recent;   Fair Remote;   Fair  Judgement:  Fair  Insight:  Present  Psychomotor Activity:  Normal   Concentration:  Concentration: Fair and Attention Span: Fair  Recall:  [employer] of Knowledge:  Good  Language:  Good  Akathisia:  No  Handed:  Right  AIMS (if indicated):     Assets:  Communication Skills Desire for Improvement Housing Transportation  ADL's:  Intact  Cognition:  WNL  Sleep:  fair     Assessment/Plan: Bipolar I disorder (HCC) - Plan: LamoTRIgine 300 MG TB24 24 hour tablet  GAD (generalized anxiety disorder) - Plan: zolpidem (AMBIEN) 10 MG tablet, LamoTRIgine 300 MG TB24 24 hour tablet, busPIRone (BUSPAR) 5 MG tablet  Primary insomnia  I reviewed blood work results.  His WBC count is slightly high.  He talked about benzodiazepine and hypnotics for long-term use.  He admitted some concern about his memory if he continued to use for a long time.  I recommend should get a sleep study but patient afraid that if he diagnosed with apnea he cannot use CPAP because he is afraid.  I encouraged him to have the diagnosis first and get the sleep study.  We also talked about other known addictive medication.  Patient had not tried trazodone, BuSpar, doxepin, melatonin.  He does not want any SSRIs.  We also talk about other options gabapentin which he recalled taking it but do not remember the details.  Patient feels that Ativan is not working and he has still a lot of anxiety.  He is in therapy with Lavada Mesi.  Discussed to try BuSpar 5 mg at bedtime and if he feels it is helping the anxiety and he can try taking twice a day.  He will start with only 5 mg.  For now he will continue Ambien 5 mg and may repeat one more time if he cannot sleep but eventually he will The sleep study or try melatonin.  I emphasized having the diagnosis is important before he can try multiple medication.  Patient agreed and we will hold the Ativan for now.  Continue Lamictal as he feels it is helping his mood swing and mania.  Follow up in 4 weeks.   Follow Up Instructions:     I discussed the  assessment and treatment plan with the patient. The patient was provided an opportunity to ask questions and all were answered. The patient agreed with the plan and demonstrated an understanding of the instructions.   The patient was advised to call back or seek an in-person evaluation if the symptoms worsen or if the condition fails to improve as anticipated.    Collaboration of Care: Other provider involved in patient's care AEB notes are available in epic to review.  Patient/Guardian was advised Release of Information must be obtained prior to any record release in order to collaborate their care with an outside provider. Patient/Guardian was advised if they have not already done so to contact the registration department to sign all necessary forms in order for [place] to release information regarding their care.   Consent: Patient/Guardian gives verbal consent for treatment and assignment of benefits for services provided during this visit. Patient/Guardian expressed understanding and agreed to proceed.     I provided 35 minutes of non face to face time during this encounter.  Note: This document was prepared by [employer] voice dictation technology and any errors that results from this process are unintentional.    Cleotis Nipper, MD 04/22/2023

## 2023-04-23 ENCOUNTER — Telehealth: Payer: Self-pay

## 2023-04-23 ENCOUNTER — Telehealth (HOSPITAL_COMMUNITY): Payer: Self-pay

## 2023-04-23 ENCOUNTER — Encounter (HOSPITAL_COMMUNITY): Payer: Self-pay | Admitting: Licensed Clinical Social Worker

## 2023-04-23 ENCOUNTER — Ambulatory Visit (INDEPENDENT_AMBULATORY_CARE_PROVIDER_SITE_OTHER): Payer: BLUE CROSS/BLUE SHIELD | Admitting: Licensed Clinical Social Worker

## 2023-04-23 DIAGNOSIS — F319 Bipolar disorder, unspecified: Secondary | ICD-10-CM

## 2023-04-23 NOTE — Telephone Encounter (Signed)
Patient is calling because they just called him to reschedule his appointment and he is now on the schedule for November, but he said you started a new medication and wanted him to come back on 10/15 - in a month - to see how he was doing. Patient wants to be sure that it is okay that he is scheduled so far out. Please review and advise, thank you

## 2023-04-23 NOTE — Telephone Encounter (Signed)
We can put him on a cancellation list or he can call us back and let us know how how the new medicine is working.

## 2023-04-23 NOTE — Telephone Encounter (Signed)
Pt has called and stated that he feels the cellulitis is back where his wound is on lt shin as it is red, swollen and warm to the touch. Pt states he finished the 1st round of ABX and is feeling he needs another round as he does not want to go to the ER again.  We do not have any apptmnts in office today and pt does not want to go to another office.  Please call pt back at [phone] with instructions on what to do next. Pt stated when he was on the ABX it was getting better ans the sxs above had went away now it is back.

## 2023-04-23 NOTE — Progress Notes (Signed)
Virtual virtual Video Note  I connected with Alex [name] on 04/23/2023 at 12-1pm EST by video-enabled virtual visit. I verified that I am speaking with the correct person using  two identifiers.I discussed the limitations of evaluation and management by telemedicine and the availability of in person appointments. The patient expressed understanding and agreed to proceed.    LOCATION: Patient: Home  Provider: Home office   History of Present Illness:  Pt was referred by Dr. Lolly [name] for OP therapy for bipolar disorder and anxiety.  Treatment Goal Addressed:  Pt will meet with clinician 1x every 2 weeks for therapy to monitor for progress towards goals and address any barriers to success; Reduce depression from average severity level of 6/10 down to a 4/10 in next 90 days by engaging in 1-2 positive coping skills daily as part of developing self-care routine; Reduce average anxiety level from 7/10 down to 5/10 in next 90 days by utilizing 1-2 relaxation skills/grounding skills per day, such as mindful breathing, progressive muscle relaxation, positive visualizations.  Progress towards Treatment Goal: Progressing   Observations/Objective: Patient presented for today's session on time and was alert, oriented x5, with no evidence or self-report of SI/HI or A/V H.  Patient reported ongoing compliance with medication and denied any use of alcohol or illicit substances.  Clinician inquired about patient's current emotional ratings, as well as any significant changes in thoughts, feelings or behavior since previous session. Patient reported scores of  6/10 for depression, 6/10 for anxiety, 5/10 for anger/irritability. Pt explored his emotional ratings, which remain high and discussed coping skills for stressors. [name]  suggested pt use mindful based coping skills and practice between sessions. Pt identifies stressors and  allowed pt to explore and express thoughts and feelings associated with recent life  situations and external stressors.[name] and pt reviewed his continued stressors individually: marital issues, son's mental health, physical health, future, pt's sexual health, after prostate cancer.  Pt's stressors have continued for a long period of time.  [name] and pt explored negative cycle of anxiety including ruminative thoughts and avoidant behaviors and discussed negative (longer-term) consequences of avoidance. skill). Majority of skill practice was focused on identifying specific problem (and priority/goal) and brainstorming  options/alternatives.   Assessment and plan: Counselor will continue to meet with patient to address treatment plan goals. Patient will continue to follow recommendations of providers and imnd implement skills learned in session, and practice between sessions. Diagnosis: Bipolar 1 disorder.    Collaboration of care: Other:   Continue working with providers    Patient/Guardian was advised Release of Information must be obtained prior to any record release in order to collaborate their care with an outside provider. Patient/Guardian was advised if they have not already done so to contact the registration department to sign all necessary forms in order for [place] to release information regarding their care.    Consent: Patient/Guardian gives verbal consent for treatment and assignment of benefits for services provided during this visit. Patient/Guardian expressed understanding and agreed to proceed.     Follow Up Instructions:  I discussed the assessment and treatment plan with the patient. The patient was provided an opportunity to ask questions and all were answered. The patient agreed with the plan and demonstrated an understanding of the instructions.   The patient was advised to call back or seek an in-person evaluation if the symptoms worsen or if the condition fails to improve as anticipated.  I provided 60 minutes of non-face-to-face time during this  encounter.   Alex [name] S, LCAS  04/16/23

## 2023-04-24 MED ORDER — DOXYCYCLINE HYCLATE 100 MG PO TABS: 100.0000 mg | ORAL_TABLET | Freq: Two times a day (BID) | ORAL | 0 refills | Status: DC

## 2023-04-24 NOTE — Telephone Encounter (Signed)
I will renew doxycycline.  Office visit if problems.  Thanks

## 2023-04-24 NOTE — Addendum Note (Signed)
Addended by: Tresa Garter on: 04/24/2023 07:26 AM   Modules accepted: Orders

## 2023-04-24 NOTE — Telephone Encounter (Signed)
Spoke with the pt and was able to inform him that PCP has sent in the ABX to his pharmacy and if he notice he is still having an issue with this wound he is to call in to be seen.  Pt expressed understanding.

## 2023-04-27 ENCOUNTER — Encounter (HOSPITAL_COMMUNITY): Payer: Self-pay | Admitting: Licensed Clinical Social Worker

## 2023-04-27 NOTE — Progress Notes (Signed)
Virtual virtual Video Note  I connected with Alex [name] on 04/16/2023 at 300-4:00 PM EST by video-enabled virtual visit. I verified that I am speaking with the correct person using  two identifiers.I discussed the limitations of evaluation and management by telemedicine and the availability of in person appointments. The patient expressed understanding and agreed to proceed.    LOCATION: Patient: Home  Provider: Home office   History of Present Illness:  Pt was referred by Dr. Lolly Mustache for OP therapy for bipolar disorder and anxiety.  Treatment Goal Addressed:  Pt will meet with clinician 1x every 2 weeks for therapy to monitor for progress towards goals and address any barriers to success; Reduce depression from average severity level of 6/10 down to a 4/10 in next 90 days by engaging in 1-2 positive coping skills daily as part of developing self-care routine; Reduce average anxiety level from 7/10 down to 5/10 in next 90 days by utilizing 1-2 relaxation skills/grounding skills per day, such as mindful breathing, progressive muscle relaxation, positive visualizations.  Progress towards Treatment Goal: Progressing   Observations/Objective: Patient presented for today's session on time and was alert, oriented x5, with no evidence or self-report of SI/HI or A/V H.  Patient reported ongoing compliance with medication and denied any use of alcohol or illicit substances.  Clinician inquired about patient's current emotional ratings, as well as any significant changes in thoughts, feelings or behavior since previous session. Patient reported scores of  7/10 for depression, 7/10 for anxiety, 5/10 for anger/irritability. Pt explored his emotional ratings, which remain high and discussed coping skills for stressors [name] suggested pt use mindful based coping skills and practice between sessions. Pt identifies stressors and  allowed pt to explore and express thoughts and feelings associated with recent life  situations and external stressors.[name] and pt reviewed his continued stressors individually: marital issues, son's mental health, physical health, future, pt's sexual health, after prostate cancer.  Pt's stressors have continued for a long period of time. "I bottle everything up and then explode." [name] provided psychoeducation on the cycles of depression and anxiety.    Assessment and plan: Counselor will continue to meet with patient to address treatment plan goals. Patient will continue to follow recommendations of providers and imnd implement skills learned in session, and practice between sessions. Diagnosis: Bipolar 1 disorder.    Collaboration of care: Other:   Continue working with providers    Patient/Guardian was advised Release of Information must be obtained prior to any record release in order to collaborate their care with an outside provider. Patient/Guardian was advised if they have not already done so to contact the registration department to sign all necessary forms in order for [place] to release information regarding their care.    Consent: Patient/Guardian gives verbal consent for treatment and assignment of benefits for services provided during this visit. Patient/Guardian expressed understanding and agreed to proceed.     Follow Up Instructions:  I discussed the assessment and treatment plan with the patient. The patient was provided an opportunity to ask questions and all were answered. The patient agreed with the plan and demonstrated an understanding of the instructions.   The patient was advised to call back or seek an in-person evaluation if the symptoms worsen or if the condition fails to improve as anticipated.  I provided 60 minutes of non-face-to-face time during this encounter.   Liston Thum S, LCAS  04/16/23

## 2023-04-28 DIAGNOSIS — H35351 Cystoid macular degeneration, right eye: Secondary | ICD-10-CM | POA: Diagnosis not present

## 2023-04-29 NOTE — Progress Notes (Unsigned)
Subjective:    Patient ID: Alex [name], male    DOB: Mar 11, 1956, 67 y.o.   MRN: [id number]      HPI Casper is here for No chief complaint on file.    Cellulitis  of LLE - initially seen in ED then saw Vickie on 9/5 for same.  Was on doxycycline and keflex at the time.  Symptoms had improved.  Here for follow up.      Medications and allergies reviewed with patient and updated if appropriate.  Current Outpatient Medications on File Prior to Visit  Medication Sig Dispense Refill   acetaminophen (TYLENOL) 500 MG tablet Take by mouth.     albuterol (VENTOLIN HFA) 108 (90 Base) MCG/ACT inhaler Inhale 2 puffs into the lungs every 6 (six) hours as needed for wheezing or shortness of breath. 8 g 0   amLODipine (NORVASC) 5 MG tablet Take 1 tablet (5 mg total) by mouth daily. 90 tablet 3   aspirin EC 81 MG tablet Take 1 tablet by mouth daily.     atorvastatin (LIPITOR) 40 MG tablet TAKE 1 TABLET BY MOUTH EVERY DAY 90 tablet 1   AXIRON 30 MG/ACT SOLN Place 2 Act onto the skin daily.     brimonidine (ALPHAGAN) 0.2 % ophthalmic solution SMARTSIG:In Eye(s)     busPIRone (BUSPAR) 5 MG tablet Take 1 tablet (5 mg total) by mouth at bedtime. 30 tablet 0   Coenzyme Q10 (CO Q 10 PO) Take 1 capsule by mouth daily.      doxycycline (VIBRA-TABS) 100 MG tablet Take 1 tablet (100 mg total) by mouth 2 (two) times daily. 20 tablet 0   EPINEPHrine 0.3 mg/0.3 mL IJ SOAJ injection Inject 0.3 mg into the muscle as needed.     erythromycin ophthalmic ointment Place into the right eye at bedtime.     Immune Globulin, Human, 4 GM/20ML SOLN Inject into the skin once a week. wednesday     LamoTRIgine 300 MG TB24 24 hour tablet Take 1 tablet (300 mg total) by mouth every morning. 90 tablet 0   latanoprost (XALATAN) 0.005 % ophthalmic solution 1 DROP EACH EYE AT NIGHT     levothyroxine (SYNTHROID) 150 MCG tablet Take 1 tablet (150 mcg total) by mouth daily before breakfast. 90 tablet 2   LORazepam (ATIVAN)  0.5 MG tablet Take one tab daily and 2nd if needed for anxiety (Patient not taking: Reported on 04/22/2023) 45 tablet 0   meloxicam (MOBIC) 15 MG tablet TAKE 1 TABLET BY MOUTH EVERY DAY AS NEEDED 90 tablet 0   Multiple Vitamin (MULTI-VITAMINS) TABS Take by mouth.     OMEGA-3 KRILL OIL PO Take 1 capsule by mouth daily.      omeprazole (PRILOSEC) 20 MG capsule Take 20 mg by mouth every evening.     sildenafil (REVATIO) 20 MG tablet Take 1 tablet (20 mg total) by mouth as needed. 30 tablet 1   tadalafil (CIALIS) 5 MG tablet Take 5 mg by mouth.     tamsulosin (FLOMAX) 0.4 MG CAPS capsule TAKE 1 CAPSULE BY MOUTH EVERY DAY (Patient taking differently: as needed.) 90 capsule 1   telmisartan (MICARDIS) 80 MG tablet Take 1 tablet (80 mg total) by mouth daily. 90 tablet 1   timolol (TIMOPTIC) 0.5 % ophthalmic solution timolol maleate 0.5 % eye drops  INSTILL 1 DROP INTO RIGHT EYE TWICE A DAY     traMADol (ULTRAM) 50 MG tablet Take 50 mg by mouth as needed.  triamcinolone ointment (KENALOG) 0.5 % Apply 1 Application topically 4 (four) times daily. On the lip 30 g 0   zolpidem (AMBIEN) 10 MG tablet TAKE 1/2 (ONE-HALF) TABLET BY MOUTH AT BEDTIME AND  TAKE  THE  OTHER  HALF  IF  NEEDED 30 tablet 0   No current facility-administered medications on file prior to visit.    Review of Systems     Objective:  There were no vitals filed for this visit. BP Readings from Last 3 Encounters:  04/17/23 122/80  04/11/23 (!) 149/103  04/01/23 130/80   Wt Readings from Last 3 Encounters:  04/17/23 177 lb (80.3 kg)  04/11/23 170 lb (77.1 kg)  04/01/23 176 lb 9.6 oz (80.1 kg)   There is no height or weight on file to calculate BMI.    Physical Exam         Assessment & Plan:    See Problem List for Assessment and Plan of chronic medical problems.

## 2023-04-30 ENCOUNTER — Ambulatory Visit (HOSPITAL_COMMUNITY): Payer: BLUE CROSS/BLUE SHIELD | Admitting: Licensed Clinical Social Worker

## 2023-04-30 ENCOUNTER — Encounter: Payer: Self-pay | Admitting: Internal Medicine

## 2023-04-30 ENCOUNTER — Ambulatory Visit: Payer: BLUE CROSS/BLUE SHIELD | Admitting: Internal Medicine

## 2023-04-30 ENCOUNTER — Encounter (HOSPITAL_COMMUNITY): Payer: Self-pay | Admitting: Licensed Clinical Social Worker

## 2023-04-30 ENCOUNTER — Ambulatory Visit (INDEPENDENT_AMBULATORY_CARE_PROVIDER_SITE_OTHER): Payer: BLUE CROSS/BLUE SHIELD | Admitting: Licensed Clinical Social Worker

## 2023-04-30 VITALS — BP 120/86 | HR 80 | Temp 98.1°F | Ht 72.0 in | Wt 178.0 lb

## 2023-04-30 DIAGNOSIS — I10 Essential (primary) hypertension: Secondary | ICD-10-CM | POA: Diagnosis not present

## 2023-04-30 DIAGNOSIS — L03116 Cellulitis of left lower limb: Secondary | ICD-10-CM | POA: Diagnosis not present

## 2023-04-30 DIAGNOSIS — F319 Bipolar disorder, unspecified: Secondary | ICD-10-CM | POA: Diagnosis not present

## 2023-04-30 NOTE — Progress Notes (Signed)
Virtual virtual Video Note  I connected with Alex [name] on 04/30/2023 at 12-12:45pm EST by video-enabled virtual visit. I verified that I am speaking with the correct person using  two identifiers.I discussed the limitations of evaluation and management by telemedicine and the availability of in person appointments. The patient expressed understanding and agreed to proceed.    LOCATION: Patient: Home  Provider: Home office   History of Present Illness:  Pt was referred by Dr. Lolly Mustache for OP therapy for bipolar disorder and anxiety.  Treatment Goal Addressed:  Pt will meet with clinician 1x every 2 weeks for therapy to monitor for progress towards goals and address any barriers to success; Reduce depression from average severity level of 6/10 down to a 4/10 in next 90 days by engaging in 1-2 positive coping skills daily as part of developing self-care routine; Reduce average anxiety level from 7/10 down to 5/10 in next 90 days by utilizing 1-2 relaxation skills/grounding skills per day, such as mindful breathing, progressive muscle relaxation, positive visualizations.  Progress towards Treatment Goal: Progressing   Observations/Objective: Patient presented for today's session on time and was alert, oriented x5, with no evidence or self-report of SI/HI or A/V H.  Patient reported ongoing compliance with medication and denied any use of alcohol or illicit substances.  Clinician inquired about patient's current emotional ratings, as well as any significant changes in thoughts, feelings or behavior since previous session. Patient reported scores of  6/10 for depression, 6/10 for anxiety, 5/10 for anger/irritability. Pt explored his emotional ratings, which remain high and discussed coping skills for stressors. [name]  suggested pt use mindful based coping skills and practice between sessions. Pt identifies stressors and  allowed pt to explore and express thoughts and feelings associated with recent life  situations and external stressors.[name] and pt reviewed his continued stressors individually: marital issues, son's mental health, physical health, future, pt's sexual health after prostate cancer.  Pt's stressors have continued for a long period of time.  Pt's son  who engages in self harm is moving out of the house this weekend. Pt and family are going on family vacation to the beach for the next week. "My  anxiety has increased somewhat  because we're leaving town while my son is moving out of the house." [name] and pt explored negative cycle of anxiety including ruminative thoughts.     Assessment and plan: Counselor will continue to meet with patient to address treatment plan goals. Patient will continue to follow recommendations of providers and imnd implement skills learned in session, and practice between sessions. Diagnosis: Bipolar 1 disorder.    Collaboration of care: Other:   Continue working with providers    Patient/Guardian was advised Release of Information must be obtained prior to any record release in order to collaborate their care with an outside provider. Patient/Guardian was advised if they have not already done so to contact the registration department to sign all necessary forms in order for [place] to release information regarding their care.    Consent: Patient/Guardian gives verbal consent for treatment and assignment of benefits for services provided during this visit. Patient/Guardian expressed understanding and agreed to proceed.     Follow Up Instructions:  I discussed the assessment and treatment plan with the patient. The patient was provided an opportunity to ask questions and all were answered. The patient agreed with the plan and demonstrated an understanding of the instructions.   The patient was advised to call back or seek an  in-person evaluation if the symptoms worsen or if the condition fails to improve as anticipated.  I provided 45 minutes of  non-face-to-face time during this encounter.   Caleel Kiner S, LCAS  04/30/23

## 2023-04-30 NOTE — Patient Instructions (Addendum)
? ? ? ?  ? ? ?  Medications changes include :  none  ? ? ? ? ? ?Return if symptoms worsen or fail to improve. ? ?

## 2023-05-01 DIAGNOSIS — Z6825 Body mass index (BMI) 25.0-25.9, adult: Secondary | ICD-10-CM | POA: Diagnosis not present

## 2023-05-01 DIAGNOSIS — M47816 Spondylosis without myelopathy or radiculopathy, lumbar region: Secondary | ICD-10-CM | POA: Diagnosis not present

## 2023-05-01 DIAGNOSIS — M4326 Fusion of spine, lumbar region: Secondary | ICD-10-CM | POA: Diagnosis not present

## 2023-05-05 ENCOUNTER — Ambulatory Visit: Payer: BLUE CROSS/BLUE SHIELD | Admitting: Internal Medicine

## 2023-05-06 ENCOUNTER — Ambulatory Visit (HOSPITAL_COMMUNITY): Payer: BLUE CROSS/BLUE SHIELD | Admitting: Licensed Clinical Social Worker

## 2023-05-12 ENCOUNTER — Ambulatory Visit: Payer: BLUE CROSS/BLUE SHIELD | Admitting: Internal Medicine

## 2023-05-12 ENCOUNTER — Encounter: Payer: Self-pay | Admitting: Internal Medicine

## 2023-05-12 VITALS — BP 116/84 | HR 59 | Temp 98.0°F | Ht 72.0 in | Wt 177.8 lb

## 2023-05-12 DIAGNOSIS — E785 Hyperlipidemia, unspecified: Secondary | ICD-10-CM | POA: Diagnosis not present

## 2023-05-12 DIAGNOSIS — L0390 Cellulitis, unspecified: Secondary | ICD-10-CM | POA: Insufficient documentation

## 2023-05-12 DIAGNOSIS — L03116 Cellulitis of left lower limb: Secondary | ICD-10-CM

## 2023-05-12 DIAGNOSIS — R600 Localized edema: Secondary | ICD-10-CM | POA: Diagnosis not present

## 2023-05-12 DIAGNOSIS — D508 Other iron deficiency anemias: Secondary | ICD-10-CM

## 2023-05-12 DIAGNOSIS — C61 Malignant neoplasm of prostate: Secondary | ICD-10-CM | POA: Diagnosis not present

## 2023-05-12 LAB — LIPID PANEL
Cholesterol: 183 mg/dL (ref 0–200)
HDL: 80.8 mg/dL (ref >39.00)
LDL Cholesterol: 74 mg/dL (ref 0–99)
NonHDL: 101.78
Total CHOL/HDL Ratio: 2
Triglycerides: 139 mg/dL (ref 0.0–149.0)
VLDL: 27.8 mg/dL (ref 0.0–40.0)

## 2023-05-12 LAB — CBC WITH DIFFERENTIAL/PLATELET
Basophils Absolute: 0.1 10*3/uL (ref 0.0–0.1)
Basophils Relative: 0.9 % (ref 0.0–3.0)
Eosinophils Absolute: 0.2 10*3/uL (ref 0.0–0.7)
Eosinophils Relative: 2.6 % (ref 0.0–5.0)
HCT: 42.8 % (ref 39.0–52.0)
Hemoglobin: 13.8 g/dL (ref 13.0–17.0)
Lymphocytes Relative: 18.1 % (ref 12.0–46.0)
Lymphs Abs: 1.4 10*3/uL (ref 0.7–4.0)
MCHC: 32.3 g/dL (ref 30.0–36.0)
MCV: 84 fL (ref 78.0–100.0)
Monocytes Absolute: 1 10*3/uL (ref 0.1–1.0)
Monocytes Relative: 12.1 % — ABNORMAL HIGH (ref 3.0–12.0)
Neutro Abs: 5.3 10*3/uL (ref 1.4–7.7)
Neutrophils Relative %: 66.3 % (ref 43.0–77.0)
Platelets: 266 10*3/uL (ref 150.0–400.0)
RBC: 5.1 Mil/uL (ref 4.22–5.81)
RDW: 15.8 % — ABNORMAL HIGH (ref 11.5–15.5)
WBC: 8 10*3/uL (ref 4.0–10.5)

## 2023-05-12 LAB — COMPREHENSIVE METABOLIC PANEL
ALT: 31 U/L (ref 0–53)
AST: 30 U/L (ref 0–37)
Albumin: 4 g/dL (ref 3.5–5.2)
Alkaline Phosphatase: 74 U/L (ref 39–117)
BUN: 18 mg/dL (ref 6–23)
CO2: 25 meq/L (ref 19–32)
Calcium: 8.7 mg/dL (ref 8.4–10.5)
Chloride: 104 meq/L (ref 96–112)
Creatinine, Ser: 0.96 mg/dL (ref 0.40–1.50)
GFR: 80.69 mL/min (ref >60.00)
Glucose, Bld: 96 mg/dL (ref 70–99)
Potassium: 3.9 meq/L (ref 3.5–5.1)
Sodium: 138 meq/L (ref 135–145)
Total Bilirubin: 0.6 mg/dL (ref 0.2–1.2)
Total Protein: 6.7 g/dL (ref 6.0–8.3)

## 2023-05-12 LAB — T3, FREE: T3, Free: 2.3 pg/mL (ref 2.3–4.2)

## 2023-05-12 LAB — T4, FREE: Free T4: 0.96 ng/dL (ref 0.60–1.60)

## 2023-05-12 LAB — TSH: TSH: 4.05 u[IU]/mL (ref 0.35–5.50)

## 2023-05-12 MED ORDER — FUROSEMIDE 20 MG PO TABS: 20.0000 mg | ORAL_TABLET | Freq: Every day | ORAL | 3 refills | Status: DC | PRN

## 2023-05-12 NOTE — Progress Notes (Signed)
Subjective:  Patient ID: Alex [name], male    DOB: 01-31-1956  Age: 67 y.o. MRN: [id number]  CC: Medical Management of Chronic Issues and Foot Swelling (Bilateral , discoloration. )   HPI [name] presents for LLE hematoma in 02/14/2023 - he was in [facility]. It ulcerated. Cellulitis appeared recently - off Doxy x 10 d  Outpatient Medications Prior to Visit  Medication Sig Dispense Refill   acetaminophen (TYLENOL) 500 MG tablet Take by mouth.     albuterol (VENTOLIN HFA) 108 (90 Base) MCG/ACT inhaler Inhale 2 puffs into the lungs every 6 (six) hours as needed for wheezing or shortness of breath. 8 g 0   amLODipine (NORVASC) 5 MG tablet Take 1 tablet (5 mg total) by mouth daily. 90 tablet 3   aspirin EC 81 MG tablet Take 1 tablet by mouth daily.     atorvastatin (LIPITOR) 40 MG tablet TAKE 1 TABLET BY MOUTH EVERY DAY 90 tablet 1   AXIRON 30 MG/ACT SOLN Place 2 Act onto the skin daily.     brimonidine (ALPHAGAN) 0.2 % ophthalmic solution SMARTSIG:In Eye(s)     busPIRone (BUSPAR) 5 MG tablet Take 1 tablet (5 mg total) by mouth at bedtime. 30 tablet 0   clobetasol cream (TEMOVATE) 0.05 % Apply topically.     Coenzyme Q10 (CO Q 10 PO) Take 1 capsule by mouth daily.      doxycycline (VIBRA-TABS) 100 MG tablet Take 1 tablet (100 mg total) by mouth 2 (two) times daily. 20 tablet 0   EPINEPHrine 0.3 mg/0.3 mL IJ SOAJ injection Inject 0.3 mg into the muscle as needed.     erythromycin ophthalmic ointment Place into the right eye at bedtime.     Immune Globulin, Human, 4 GM/20ML SOLN Inject into the skin once a week. wednesday     LamoTRIgine 300 MG TB24 24 hour tablet Take 1 tablet (300 mg total) by mouth every morning. 90 tablet 0   latanoprost (XALATAN) 0.005 % ophthalmic solution 1 DROP EACH EYE AT NIGHT     levothyroxine (SYNTHROID) 150 MCG tablet Take 1 tablet (150 mcg total) by mouth daily before breakfast. 90 tablet 2   LORazepam (ATIVAN) 0.5 MG tablet Take one tab daily and 2nd if  needed for anxiety 45 tablet 0   meloxicam (MOBIC) 15 MG tablet TAKE 1 TABLET BY MOUTH EVERY DAY AS NEEDED 90 tablet 0   Multiple Vitamin (MULTI-VITAMINS) TABS Take by mouth.     OMEGA-3 KRILL OIL PO Take 1 capsule by mouth daily.      omeprazole (PRILOSEC) 20 MG capsule Take 20 mg by mouth every evening.     sildenafil (REVATIO) 20 MG tablet Take 1 tablet (20 mg total) by mouth as needed. 30 tablet 1   tadalafil (CIALIS) 5 MG tablet Take 5 mg by mouth.     tamsulosin (FLOMAX) 0.4 MG CAPS capsule TAKE 1 CAPSULE BY MOUTH EVERY DAY (Patient taking differently: as needed.) 90 capsule 1   telmisartan (MICARDIS) 80 MG tablet Take 1 tablet (80 mg total) by mouth daily. 90 tablet 1   timolol (TIMOPTIC) 0.5 % ophthalmic solution timolol maleate 0.5 % eye drops  INSTILL 1 DROP INTO RIGHT EYE TWICE A DAY     traMADol (ULTRAM) 50 MG tablet Take 50 mg by mouth as needed.     triamcinolone ointment (KENALOG) 0.5 % Apply 1 Application topically 4 (four) times daily. On the lip 30 g 0   zolpidem (AMBIEN) 10  MG tablet TAKE 1/2 (ONE-HALF) TABLET BY MOUTH AT BEDTIME AND  TAKE  THE  OTHER  HALF  IF  NEEDED 30 tablet 0   No facility-administered medications prior to visit.    ROS: Review of Systems  Constitutional:  Negative for appetite change, fatigue and unexpected weight change.  HENT:  Negative for congestion, nosebleeds, sneezing, sore throat and trouble swallowing.   Eyes:  Negative for itching and visual disturbance.  Respiratory:  Negative for cough.   Cardiovascular:  Negative for chest pain, palpitations and leg swelling.  Gastrointestinal:  Negative for abdominal distention, blood in stool, diarrhea and nausea.  Genitourinary:  Negative for frequency and hematuria.  Musculoskeletal:  Positive for back pain. Negative for gait problem, joint swelling and neck pain.  Skin:  Positive for color change and wound. Negative for rash.  Neurological:  Negative for dizziness, tremors, speech difficulty and  weakness.  Psychiatric/Behavioral:  Negative for agitation, dysphoric mood, sleep disturbance and suicidal ideas. The patient is nervous/anxious.     Objective:  BP 116/84 (BP Location: Left Arm, Patient Position: Sitting, Cuff Size: Normal)   Pulse (!) 59   Temp 98 F (36.7 C) (Oral)   Ht 6' (1.829 m)   Wt 177 lb 12.8 oz (80.6 kg)   SpO2 97%   BMI 24.11 kg/m   BP Readings from Last 3 Encounters:  05/12/23 116/84  04/30/23 120/86  04/17/23 122/80    Wt Readings from Last 3 Encounters:  05/12/23 177 lb 12.8 oz (80.6 kg)  04/30/23 178 lb (80.7 kg)  04/17/23 177 lb (80.3 kg)    Physical Exam Constitutional:      General: He is not in acute distress.    Appearance: Normal appearance. He is well-developed.     Comments: NAD  Eyes:     Conjunctiva/sclera: Conjunctivae normal.     Pupils: Pupils are equal, round, and reactive to light.  Neck:     Thyroid: No thyromegaly.     Vascular: No JVD.  Cardiovascular:     Rate and Rhythm: Normal rate and regular rhythm.     Heart sounds: Normal heart sounds. No murmur heard.    No friction rub. No gallop.  Pulmonary:     Effort: Pulmonary effort is normal. No respiratory distress.     Breath sounds: Normal breath sounds. No wheezing or rales.  Chest:     Chest wall: No tenderness.  Abdominal:     General: Bowel sounds are normal. There is no distension.     Palpations: Abdomen is soft. There is no mass.     Tenderness: There is no abdominal tenderness. There is no guarding or rebound.  Musculoskeletal:        General: Signs of injury present. No tenderness. Normal range of motion.     Cervical back: Normal range of motion.     Right lower leg: Edema present.     Left lower leg: Edema present.  Lymphadenopathy:     Cervical: No cervical adenopathy.  Skin:    General: Skin is warm and dry.     Findings: No rash.  Neurological:     Mental Status: He is alert and oriented to person, place, and time.     Cranial Nerves: No  cranial nerve deficit.     Motor: No abnormal muscle tone.     Coordination: Coordination normal.     Gait: Gait abnormal.     Deep Tendon Reflexes: Reflexes are normal and symmetric.  Psychiatric:  Behavior: Behavior normal.        Thought Content: Thought content normal.        Judgment: Judgment normal.     Mild erythema, L shin and 1x1 cm clean ulcer with a scab B feet w/edema  Lab Results  Component Value Date   WBC 8.0 05/12/2023   HGB 13.8 05/12/2023   HCT 42.8 05/12/2023   PLT 266.0 05/12/2023   GLUCOSE 96 05/12/2023   CHOL 183 05/12/2023   TRIG 139.0 05/12/2023   HDL 80.80 05/12/2023   LDLDIRECT 59.0 02/02/2016   LDLCALC 74 05/12/2023   ALT 31 05/12/2023   AST 30 05/12/2023   NA 138 05/12/2023   K 3.9 05/12/2023   CL 104 05/12/2023   CREATININE 0.96 05/12/2023   BUN 18 05/12/2023   CO2 25 05/12/2023   TSH 4.05 05/12/2023   PSA 5.19 (H) 06/14/2021   INR 1.0 09/13/2019   HGBA1C 5.5 09/29/2018    DG Tibia/Fibula Left  Result Date: 04/11/2023 CLINICAL DATA:  Deep wound infection EXAM: LEFT TIBIA AND FIBULA - 2 VIEW COMPARISON:  None Available. FINDINGS: No fracture or dislocation. Preserved joint spaces and bone mineralization. Soft tissue swelling about the ankle. On the lateral view there is small lucency in the soft tissues anterior to the mid lower leg. This appears to be slightly lateral on the AP view. Please correlate for soft tissue abnormality. Overall if there is concern of bone infection, MRI or bone scan is recommended for further delineation. IMPRESSION: No acute osseous abnormality. Small soft tissue lucency along the anterolateral lower leg. Please correlate with exact location of abnormality. Electronically Signed   By: Karen Kays [name]   On: 04/11/2023 12:42    Assessment & Plan:   Problem List Items Addressed This Visit     Edema - Primary    Chronic venous insufficiency L>>R worse after a vacation Use Elastic Socks knee high for  support Duoderm patch      Relevant Orders   CBC with Differential/Platelet (Completed)   Comprehensive metabolic panel (Completed)   Lipid panel (Completed)   TSH (Completed)   T4, free (Completed)   T3, free (Completed)   Iron deficiency anemia    F/u w/Dr Myna Hidalgo      Relevant Orders   CBC with Differential/Platelet (Completed)   Hyperlipidemia    On Lipitor      Relevant Medications   furosemide (LASIX) 20 MG tablet   Other Relevant Orders   CBC with Differential/Platelet (Completed)   Comprehensive metabolic panel (Completed)   Lipid panel (Completed)   TSH (Completed)   T4, free (Completed)   T3, free (Completed)   Prostate cancer (HCC)    PSA is pending at [facility]       Cellulitis    LLE resolving         Meds ordered this encounter  Medications   furosemide (LASIX) 20 MG tablet    Sig: Take 1-2 tablets (20-40 mg total) by mouth daily as needed.    Dispense:  30 tablet    Refill:  3      Follow-up: Return in about 3 months (around 08/11/2023) for a follow-up visit.  Sonda Primes, MD

## 2023-05-12 NOTE — Patient Instructions (Signed)
Duoderm patch

## 2023-05-12 NOTE — Assessment & Plan Note (Signed)
LLE resolving

## 2023-05-12 NOTE — Assessment & Plan Note (Signed)
Chronic venous insufficiency L>>R worse after a vacation Use Elastic Socks knee high for support Duoderm patch

## 2023-05-12 NOTE — Assessment & Plan Note (Signed)
PSA is pending at [facility]

## 2023-05-12 NOTE — Assessment & Plan Note (Signed)
On Lipitor 

## 2023-05-12 NOTE — Assessment & Plan Note (Signed)
F/u w/Dr Marin Olp

## 2023-05-13 ENCOUNTER — Encounter (HOSPITAL_COMMUNITY): Payer: Self-pay | Admitting: Licensed Clinical Social Worker

## 2023-05-13 ENCOUNTER — Ambulatory Visit (INDEPENDENT_AMBULATORY_CARE_PROVIDER_SITE_OTHER): Payer: BLUE CROSS/BLUE SHIELD | Admitting: Licensed Clinical Social Worker

## 2023-05-13 ENCOUNTER — Telehealth: Payer: Self-pay | Admitting: *Deleted

## 2023-05-13 DIAGNOSIS — F319 Bipolar disorder, unspecified: Secondary | ICD-10-CM

## 2023-05-13 NOTE — Telephone Encounter (Signed)
Received a call from [facility] stating that after doing a followup call with patient, he reported swelling in lower extremities, and has been treated with antibiotics for cellulitis by his PCP.  Hyzentra is on hold until ok with physician to continue.  Called patient and he saw his PCP last night who assessed the situation and said his symptoms are not related to the Hyzentra.   Ok to give Hyzentra per patient and Dr Myna Hidalgo .  Relayed this information to Annabelle Harman in [facility]

## 2023-05-13 NOTE — Progress Notes (Unsigned)
Virtual virtual Video Note  I connected with Alex [name] on 05/13/2023 at 2-3pm EST by video-enabled virtual visit. I verified that I am speaking with the correct person using  two identifiers.I discussed the limitations of evaluation and management by telemedicine and the availability of in person appointments. The patient expressed understanding and agreed to proceed.    LOCATION: Patient: Home  Provider: Home office   History of Present Illness:  Pt was referred by Dr. Lolly Mustache for OP therapy for bipolar disorder and anxiety.  Treatment Goal Addressed:  Pt will meet with clinician 1x every 2 weeks for therapy to monitor for progress towards goals and address any barriers to success; Reduce depression from average severity level of 6/10 down to a 4/10 in next 90 days by engaging in 1-2 positive coping skills daily as part of developing self-care routine; Reduce average anxiety level from 7/10 down to 5/10 in next 90 days by utilizing 1-2 relaxation skills/grounding skills per day, such as mindful breathing, progressive muscle relaxation, positive visualizations.  Progress towards Treatment Goal: Progressing   Observations/Objective: Patient presented for today's session on time and was alert, oriented x5, with no evidence or self-report of SI/HI or A/V H.  Patient reported ongoing compliance with medication and denied any use of alcohol or illicit substances.  Clinician inquired about patient's current emotional ratings, as well as any significant changes in thoughts, feelings or behavior since previous session. Patient reported scores of  5/10 for depression, 5/10 for anxiety, 2/10 for anger/irritability. Pt explored his emotional ratings, which remain high and discussed coping skills for stressors. [name]  suggested pt use mindful based coping skills and practice between sessions. Pt identifies stressors and  allowed pt to explore and express thoughts and feelings associated with recent life  situations and external stressors.[name] and pt reviewed his continued stressors individually: marital issues, son's mental health, physical health, future, pt's sexual health after prostate cancer.  Pt's stressors have continued for a long period of time.  Pt's son  who engages in self harm is moved out of the house with no incidents. Pt and family went on family vacation to the beach for the next week. [name] asked open ended questions.   Assessment and plan: Counselor will continue to meet with patient to address treatment plan goals. Patient will continue to follow recommendations of providers and imnd implement skills learned in session, and practice between sessions. Diagnosis: Bipolar 1 disorder.    Collaboration of care: Other:   Continue working with providers    Patient/Guardian was advised Release of Information must be obtained prior to any record release in order to collaborate their care with an outside provider. Patient/Guardian was advised if they have not already done so to contact the registration department to sign all necessary forms in order for [place] to release information regarding their care.    Consent: Patient/Guardian gives verbal consent for treatment and assignment of benefits for services provided during this visit. Patient/Guardian expressed understanding and agreed to proceed.     Follow Up Instructions:  I discussed the assessment and treatment plan with the patient. The patient was provided an opportunity to ask questions and all were answered. The patient agreed with the plan and demonstrated an understanding of the instructions.   The patient was advised to call back or seek an in-person evaluation if the symptoms worsen or if the condition fails to improve as anticipated.  I provided 60 minutes of non-face-to-face time during this encounter.  Selena Swaminathan S, LCAS  05/13/23

## 2023-05-16 ENCOUNTER — Telehealth (HOSPITAL_COMMUNITY): Payer: BLUE CROSS/BLUE SHIELD | Admitting: Psychiatry

## 2023-05-16 ENCOUNTER — Other Ambulatory Visit (HOSPITAL_COMMUNITY): Payer: Self-pay | Admitting: Psychiatry

## 2023-05-16 DIAGNOSIS — F411 Generalized anxiety disorder: Secondary | ICD-10-CM

## 2023-05-16 DIAGNOSIS — N200 Calculus of kidney: Secondary | ICD-10-CM | POA: Diagnosis not present

## 2023-05-19 ENCOUNTER — Encounter (HOSPITAL_COMMUNITY): Payer: Self-pay | Admitting: Licensed Clinical Social Worker

## 2023-05-19 ENCOUNTER — Ambulatory Visit (INDEPENDENT_AMBULATORY_CARE_PROVIDER_SITE_OTHER): Payer: BLUE CROSS/BLUE SHIELD | Admitting: Licensed Clinical Social Worker

## 2023-05-19 DIAGNOSIS — L570 Actinic keratosis: Secondary | ICD-10-CM | POA: Diagnosis not present

## 2023-05-19 DIAGNOSIS — F319 Bipolar disorder, unspecified: Secondary | ICD-10-CM

## 2023-05-19 DIAGNOSIS — Z08 Encounter for follow-up examination after completed treatment for malignant neoplasm: Secondary | ICD-10-CM | POA: Diagnosis not present

## 2023-05-19 DIAGNOSIS — X32XXXD Exposure to sunlight, subsequent encounter: Secondary | ICD-10-CM | POA: Diagnosis not present

## 2023-05-19 DIAGNOSIS — Z85828 Personal history of other malignant neoplasm of skin: Secondary | ICD-10-CM | POA: Diagnosis not present

## 2023-05-19 NOTE — Progress Notes (Signed)
Virtual virtual Video Note  I connected with Alex [name] on 05/19/2023 at 2-3pm EST by video-enabled virtual visit. I verified that I am speaking with the correct person using  two identifiers.I discussed the limitations of evaluation and management by telemedicine and the availability of in person appointments. The patient expressed understanding and agreed to proceed.    LOCATION: Patient: Home  Provider: Home office   History of Present Illness:  Pt was referred by Dr. Lolly Mustache for OP therapy for bipolar disorder and anxiety.  Treatment Goal Addressed:  Pt will meet with clinician 1x every 2 weeks for therapy to monitor for progress towards goals and address any barriers to success; Reduce depression from average severity level of 6/10 down to a 4/10 in next 90 days by engaging in 1-2 positive coping skills daily as part of developing self-care routine; Reduce average anxiety level from 7/10 down to 5/10 in next 90 days by utilizing 1-2 relaxation skills/grounding skills per day, such as mindful breathing, progressive muscle relaxation, positive visualizations.  Progress towards Treatment Goal: Progressing   Observations/Objective: Patient presented for today's session on time and was alert, oriented x5, with no evidence or self-report of SI/HI or A/V H.  Patient reported ongoing compliance with medication and denied any use of alcohol or illicit substances.  Clinician inquired about patient's current emotional ratings, as well as any significant changes in thoughts, feelings or behavior since previous session. Patient reported scores of  5/10 for depression, 5/10 for anxiety, 2/10 for anger/irritability. Pt explored his emotional ratings, and discussed coping skills for stressors. Again, cln suggested pt use mindful based coping skills and practice between sessions. Pt identifies stressors and  allowed pt to explore and express thoughts and feelings associated with recent life situations and  external stressors.[name] and pt reviewed his continued stressors individually: marital issues, son's mental health, physical health, future, pt's sexual health after prostate cancer. Pt's stressors have continued for a long period of time.  Pt's son  who engages in self harm has moved out of the house with no incidents. [name] asked open ended questions. Clinician utilized MI OARS to reflect and summarize his thoughts and feelings about his current life stressors.   Assessment and plan: Counselor will continue to meet with patient to address treatment plan goals. Patient will continue to follow recommendations of providers and imnd implement skills learned in session, and practice between sessions. Diagnosis: Bipolar 1 disorder.    Collaboration of care: Other:   Continue working with providers    Patient/Guardian was advised Release of Information must be obtained prior to any record release in order to collaborate their care with an outside provider. Patient/Guardian was advised if they have not already done so to contact the registration department to sign all necessary forms in order for [place] to release information regarding their care.    Consent: Patient/Guardian gives verbal consent for treatment and assignment of benefits for services provided during this visit. Patient/Guardian expressed understanding and agreed to proceed.     Follow Up Instructions:  I discussed the assessment and treatment plan with the patient. The patient was provided an opportunity to ask questions and all were answered. The patient agreed with the plan and demonstrated an understanding of the instructions.   The patient was advised to call back or seek an in-person evaluation if the symptoms worsen or if the condition fails to improve as anticipated.  I provided 60 minutes of non-face-to-face time during this encounter.   Alizabeth Antonio  S, LCAS  05/19/23

## 2023-05-20 ENCOUNTER — Ambulatory Visit (HOSPITAL_COMMUNITY): Payer: BLUE CROSS/BLUE SHIELD | Admitting: Licensed Clinical Social Worker

## 2023-05-26 ENCOUNTER — Other Ambulatory Visit: Payer: Self-pay | Admitting: Internal Medicine

## 2023-05-26 DIAGNOSIS — Z8546 Personal history of malignant neoplasm of prostate: Secondary | ICD-10-CM | POA: Diagnosis not present

## 2023-05-26 DIAGNOSIS — Z08 Encounter for follow-up examination after completed treatment for malignant neoplasm: Secondary | ICD-10-CM | POA: Diagnosis not present

## 2023-05-27 ENCOUNTER — Telehealth (HOSPITAL_COMMUNITY): Payer: BLUE CROSS/BLUE SHIELD | Admitting: Psychiatry

## 2023-05-27 ENCOUNTER — Ambulatory Visit (HOSPITAL_COMMUNITY): Payer: BLUE CROSS/BLUE SHIELD | Admitting: Licensed Clinical Social Worker

## 2023-05-28 DIAGNOSIS — M47816 Spondylosis without myelopathy or radiculopathy, lumbar region: Secondary | ICD-10-CM | POA: Diagnosis not present

## 2023-05-28 DIAGNOSIS — Z08 Encounter for follow-up examination after completed treatment for malignant neoplasm: Secondary | ICD-10-CM | POA: Diagnosis not present

## 2023-05-28 DIAGNOSIS — Z8546 Personal history of malignant neoplasm of prostate: Secondary | ICD-10-CM | POA: Diagnosis not present

## 2023-05-29 ENCOUNTER — Encounter (HOSPITAL_COMMUNITY): Payer: Self-pay | Admitting: Licensed Clinical Social Worker

## 2023-05-29 ENCOUNTER — Ambulatory Visit (INDEPENDENT_AMBULATORY_CARE_PROVIDER_SITE_OTHER): Payer: BLUE CROSS/BLUE SHIELD | Admitting: Licensed Clinical Social Worker

## 2023-05-29 DIAGNOSIS — F411 Generalized anxiety disorder: Secondary | ICD-10-CM

## 2023-05-29 DIAGNOSIS — F319 Bipolar disorder, unspecified: Secondary | ICD-10-CM | POA: Diagnosis not present

## 2023-05-29 NOTE — Progress Notes (Signed)
Virtual virtual Video Note  I connected with Alex [name] on 05/29/2023 at 4-5pm EST by video-enabled virtual visit. I verified that I am speaking with the correct person using  two identifiers.I discussed the limitations of evaluation and management by telemedicine and the availability of in person appointments. The patient expressed understanding and agreed to proceed.    LOCATION: Patient: Home  Provider: Home office   History of Present Illness:  Pt was referred by Dr. Lolly Mustache for OP therapy for bipolar disorder and anxiety.  Treatment Goal Addressed:  Pt will meet with clinician 1x every 2 weeks for therapy to monitor for progress towards goals and address any barriers to success; Reduce depression from average severity level of 6/10 down to a 4/10 in next 90 days by engaging in 1-2 positive coping skills daily as part of developing self-care routine; Reduce average anxiety level from 7/10 down to 5/10 in next 90 days by utilizing 1-2 relaxation skills/grounding skills per day, such as mindful breathing, progressive muscle relaxation, positive visualizations.  Progress towards Treatment Goal: Progressing   Observations/Objective: Patient presented for today's session on time and was alert, oriented x5, with no evidence or self-report of SI/HI or A/V H.  Patient reported ongoing compliance with medication and denied any use of alcohol or illicit substances.  Clinician inquired about patient's current emotional ratings, as well as any significant changes in thoughts, feelings or behavior since previous session. Patient reported scores of  5/10 for depression, 5/10 for anxiety, 3/10 for anger/irritability. Pt explored his emotional ratings, and discussed coping skills for stressors. Pt identifies stressors and  allowed pt to explore and express thoughts and feelings associated with recent life situations and external stressors.[name] and pt reviewed his continued stressors individually: marital  issues, son's mental health, physical health, future, pt's sexual health after prostate cancer. Pt's stressors have continued for a long period of time. [name] introduced chain analysis from DBT including components of problems, consequences, precipitating events and triggers, and vulnerability factors.   Assessment and plan: Counselor will continue to meet with patient to address treatment plan goals. Patient will continue to follow recommendations of providers and imnd implement skills learned in session, and practice between sessions. Diagnosis: Bipolar 1 disorder.    Collaboration of care: Other:   Continue working with providers    Patient/Guardian was advised Release of Information must be obtained prior to any record release in order to collaborate their care with an outside provider. Patient/Guardian was advised if they have not already done so to contact the registration department to sign all necessary forms in order for [place] to release information regarding their care.    Consent: Patient/Guardian gives verbal consent for treatment and assignment of benefits for services provided during this visit. Patient/Guardian expressed understanding and agreed to proceed.     Follow Up Instructions:  I discussed the assessment and treatment plan with the patient. The patient was provided an opportunity to ask questions and all were answered. The patient agreed with the plan and demonstrated an understanding of the instructions.   The patient was advised to call back or seek an in-person evaluation if the symptoms worsen or if the condition fails to improve as anticipated.  I provided 60 minutes of non-face-to-face time during this encounter.   Damiyah Ditmars S, LCAS  05/29/23

## 2023-06-03 ENCOUNTER — Ambulatory Visit (HOSPITAL_COMMUNITY): Payer: BLUE CROSS/BLUE SHIELD | Admitting: Licensed Clinical Social Worker

## 2023-06-03 DIAGNOSIS — F319 Bipolar disorder, unspecified: Secondary | ICD-10-CM

## 2023-06-03 DIAGNOSIS — F411 Generalized anxiety disorder: Secondary | ICD-10-CM | POA: Diagnosis not present

## 2023-06-03 DIAGNOSIS — N200 Calculus of kidney: Secondary | ICD-10-CM | POA: Diagnosis not present

## 2023-06-05 ENCOUNTER — Other Ambulatory Visit: Payer: Self-pay | Admitting: Internal Medicine

## 2023-06-09 DIAGNOSIS — H35351 Cystoid macular degeneration, right eye: Secondary | ICD-10-CM | POA: Diagnosis not present

## 2023-06-10 ENCOUNTER — Ambulatory Visit (HOSPITAL_COMMUNITY): Payer: BLUE CROSS/BLUE SHIELD | Admitting: Licensed Clinical Social Worker

## 2023-06-10 DIAGNOSIS — C61 Malignant neoplasm of prostate: Secondary | ICD-10-CM | POA: Diagnosis not present

## 2023-06-10 DIAGNOSIS — E291 Testicular hypofunction: Secondary | ICD-10-CM | POA: Diagnosis not present

## 2023-06-10 DIAGNOSIS — M47816 Spondylosis without myelopathy or radiculopathy, lumbar region: Secondary | ICD-10-CM | POA: Diagnosis not present

## 2023-06-10 DIAGNOSIS — N202 Calculus of kidney with calculus of ureter: Secondary | ICD-10-CM | POA: Diagnosis not present

## 2023-06-11 ENCOUNTER — Encounter (HOSPITAL_COMMUNITY): Payer: Self-pay | Admitting: Licensed Clinical Social Worker

## 2023-06-11 ENCOUNTER — Ambulatory Visit (HOSPITAL_COMMUNITY): Payer: BLUE CROSS/BLUE SHIELD | Admitting: Licensed Clinical Social Worker

## 2023-06-11 DIAGNOSIS — F319 Bipolar disorder, unspecified: Secondary | ICD-10-CM

## 2023-06-11 NOTE — Progress Notes (Signed)
Virtual virtual Video Note  I connected with Kayven Corbeil Bottari on 06/03/2023 at 2-3 pm EST by video-enabled virtual visit. I verified that I am speaking with the correct person using  two identifiers.I discussed the limitations of evaluation and management by telemedicine and the availability of in person appointments. The patient expressed understanding and agreed to proceed.    LOCATION: Patient: Home  Provider: Home office   History of Present Illness:  Pt was referred by Dr. Lolly Mustache for OP therapy for bipolar disorder and anxiety.  Treatment Goal Addressed:  Pt will meet with clinician 1x every 2 weeks for therapy to monitor for progress towards goals and address any barriers to success; Reduce depression from average severity level of 6/10 down to a 4/10 in next 90 days by engaging in 1-2 positive coping skills daily as part of developing self-care routine; Reduce average anxiety level from 7/10 down to 5/10 in next 90 days by utilizing 1-2 relaxation skills/grounding skills per day, such as mindful breathing, progressive muscle relaxation, positive visualizations.  Progress towards Treatment Goal: Progressing   Observations/Objective: Patient presented for today's session on time and was alert, oriented x5, with no evidence or self-report of SI/HI or A/V H.  Patient reported ongoing compliance with medication and denied any use of alcohol or illicit substances.  Clinician inquired about patient's current emotional ratings, as well as any significant changes in thoughts, feelings or behavior since previous session. Patient reported scores of  5/10 for depression, 5/10 for anxiety, 3/10 for anger/irritability. Pt explored his emotional ratings, and discussed coping skills for stressors. Pt identifies stressors and  allowed pt to explore and express thoughts and feelings associated with recent life situations and external stressors.[name] and pt reviewed his continued stressors individually: marital  issues, son's mental health, physical health, future, pt's sexual health after prostate cancer. Pt's stressors have continued for a long period of time. [name] used CBT to assist in identifying positives to assess how his mood has been impacted. Clinician allowed space for patient to further process emotions and clinician provided supportive statements throughout session.     Assessment and plan: Counselor will continue to meet with patient to address treatment plan goals. Patient will continue to follow recommendations of providers and imnd implement skills learned in session, and practice between sessions. Diagnosis: Bipolar 1 disorder, GAD    Collaboration of care: Other:   Continue working with providers    Patient/Guardian was advised Release of Information must be obtained prior to any record release in order to collaborate their care with an outside provider. Patient/Guardian was advised if they have not already done so to contact the registration department to sign all necessary forms in order for [place] to release information regarding their care.    Consent: Patient/Guardian gives verbal consent for treatment and assignment of benefits for services provided during this visit. Patient/Guardian expressed understanding and agreed to proceed.     Follow Up Instructions:  I discussed the assessment and treatment plan with the patient. The patient was provided an opportunity to ask questions and all were answered. The patient agreed with the plan and demonstrated an understanding of the instructions.   The patient was advised to call back or seek an in-person evaluation if the symptoms worsen or if the condition fails to improve as anticipated.  I provided 60 minutes of non-face-to-face time during this encounter.   Zeppelin Beckstrand S, LCAS  06/03/23

## 2023-06-11 NOTE — Progress Notes (Signed)
Virtual virtual Video Note  I connected with Alex [name] on 06/11/2023 at 12-1pm EST by video-enabled virtual visit. I verified that I am speaking with the correct person using  two identifiers.I discussed the limitations of evaluation and management by telemedicine and the availability of in person appointments. The patient expressed understanding and agreed to proceed.    LOCATION: Patient: Home  Provider: Home office   History of Present Illness:  Pt was referred by Dr. Lolly Mustache for OP therapy for bipolar disorder and anxiety.  Treatment Goal Addressed:  Pt will meet with clinician 1x every 2 weeks for therapy to monitor for progress towards goals and address any barriers to success; Reduce depression from average severity level of 6/10 down to a 4/10 in next 90 days by engaging in 1-2 positive coping skills daily as part of developing self-care routine; Reduce average anxiety level from 7/10 down to 5/10 in next 90 days by utilizing 1-2 relaxation skills/grounding skills per day, such as mindful breathing, progressive muscle relaxation, positive visualizations.  Progress towards Treatment Goal: Progressing   Observations/Objective: Patient presented for today's session on time and was alert, oriented x5, with no evidence or self-report of SI/HI or A/V H.  Patient reported ongoing compliance with medication and denied any use of alcohol or illicit substances.  Clinician inquired about patient's current emotional ratings, as well as any significant changes in thoughts, feelings or behavior since previous session. Patient reported scores of  5/10 for depression, 5/10 for anxiety, 3/10 for anger/irritability. Pt explored his emotional ratings, and discussed coping skills for stressors. Pt identifies stressors and  allowed pt to explore and express thoughts and feelings associated with recent life situations and external stressors.[name] and pt reviewed his continued stressors individually: marital  issues, son's mental health, physical health, future, pt's sexual health after prostate cancer, upcoming prostate biopsy. Pt's stressors have continued for a long period of time. . [name] validated pt feelings, noting the cumulative effect of multiple concurrent stressors. We worked to identify strategies pt might use to reduce stress. Also encouraged pt to make sure he is taking time for self-care and seeking support when needed  Assessment and plan: Counselor will continue to meet with patient to address treatment plan goals. Patient will continue to follow recommendations of providers and imnd implement skills learned in session, and practice between sessions. Diagnosis: Bipolar 1 disorder.    Collaboration of care: Other:   Continue working with providers    Patient/Guardian was advised Release of Information must be obtained prior to any record release in order to collaborate their care with an outside provider. Patient/Guardian was advised if they have not already done so to contact the registration department to sign all necessary forms in order for [place] to release information regarding their care.    Consent: Patient/Guardian gives verbal consent for treatment and assignment of benefits for services provided during this visit. Patient/Guardian expressed understanding and agreed to proceed.     Follow Up Instructions:  I discussed the assessment and treatment plan with the patient. The patient was provided an opportunity to ask questions and all were answered. The patient agreed with the plan and demonstrated an understanding of the instructions.   The patient was advised to call back or seek an in-person evaluation if the symptoms worsen or if the condition fails to improve as anticipated.  I provided 60 minutes of non-face-to-face time during this encounter.   Cyrus Ramsburg S, LCAS  06/11/23

## 2023-06-16 DIAGNOSIS — N411 Chronic prostatitis: Secondary | ICD-10-CM | POA: Diagnosis not present

## 2023-06-16 DIAGNOSIS — C61 Malignant neoplasm of prostate: Secondary | ICD-10-CM | POA: Diagnosis not present

## 2023-06-18 ENCOUNTER — Ambulatory Visit (INDEPENDENT_AMBULATORY_CARE_PROVIDER_SITE_OTHER): Payer: BLUE CROSS/BLUE SHIELD | Admitting: Licensed Clinical Social Worker

## 2023-06-18 DIAGNOSIS — F319 Bipolar disorder, unspecified: Secondary | ICD-10-CM | POA: Diagnosis not present

## 2023-06-24 ENCOUNTER — Ambulatory Visit (HOSPITAL_COMMUNITY): Payer: BLUE CROSS/BLUE SHIELD | Admitting: Licensed Clinical Social Worker

## 2023-06-24 DIAGNOSIS — F319 Bipolar disorder, unspecified: Secondary | ICD-10-CM

## 2023-06-24 DIAGNOSIS — F411 Generalized anxiety disorder: Secondary | ICD-10-CM

## 2023-06-25 ENCOUNTER — Ambulatory Visit (HOSPITAL_COMMUNITY): Payer: BLUE CROSS/BLUE SHIELD | Admitting: Licensed Clinical Social Worker

## 2023-07-01 ENCOUNTER — Encounter (HOSPITAL_COMMUNITY): Payer: Self-pay

## 2023-07-01 ENCOUNTER — Ambulatory Visit (HOSPITAL_COMMUNITY): Payer: BLUE CROSS/BLUE SHIELD | Admitting: Licensed Clinical Social Worker

## 2023-07-01 DIAGNOSIS — X32XXXD Exposure to sunlight, subsequent encounter: Secondary | ICD-10-CM | POA: Diagnosis not present

## 2023-07-01 DIAGNOSIS — D225 Melanocytic nevi of trunk: Secondary | ICD-10-CM | POA: Diagnosis not present

## 2023-07-01 DIAGNOSIS — L570 Actinic keratosis: Secondary | ICD-10-CM | POA: Diagnosis not present

## 2023-07-01 DIAGNOSIS — Z1283 Encounter for screening for malignant neoplasm of skin: Secondary | ICD-10-CM | POA: Diagnosis not present

## 2023-07-02 ENCOUNTER — Encounter (HOSPITAL_COMMUNITY): Payer: Self-pay | Admitting: Psychiatry

## 2023-07-02 ENCOUNTER — Telehealth (HOSPITAL_BASED_OUTPATIENT_CLINIC_OR_DEPARTMENT_OTHER): Payer: BLUE CROSS/BLUE SHIELD | Admitting: Psychiatry

## 2023-07-02 VITALS — Wt 177.0 lb

## 2023-07-02 DIAGNOSIS — F5101 Primary insomnia: Secondary | ICD-10-CM

## 2023-07-02 DIAGNOSIS — F319 Bipolar disorder, unspecified: Secondary | ICD-10-CM

## 2023-07-02 DIAGNOSIS — F411 Generalized anxiety disorder: Secondary | ICD-10-CM

## 2023-07-02 MED ORDER — ZOLPIDEM TARTRATE 10 MG PO TABS
ORAL_TABLET | ORAL | 0 refills | Status: DC
Start: 2023-07-02 — End: 2023-08-25

## 2023-07-02 MED ORDER — LAMOTRIGINE ER 300 MG PO TB24
1.0000 | ORAL_TABLET | ORAL | 0 refills | Status: DC
Start: 2023-07-02 — End: 2023-10-03

## 2023-07-02 NOTE — Progress Notes (Signed)
[facility] MD Virtual Progress Note   Patient Location: Home Provider Location: Home Office  I connect with patient by telephone and verified that I am speaking with correct person by using two identifiers. I discussed the limitations of evaluation and management by telemedicine and the availability of in person appointments. I also discussed with the patient that there may be a patient responsible charge related to this service. The patient expressed understanding and agreed to proceed.  Alex [name] [id number] y.o.  07/02/2023 10:49 AM  History of Present Illness:  Patient is evaluated by phone session.  He cannot do video because yesterday he had a procedure on his lip for the lesion and last night he had bleeding from the blisters.  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  Patient reported things are okay.  He has chronic depression mostly due to financial reasons.  We have provided BuSpar but he could not tolerate the side effects.  He feels worst with the BuSpar.  He is sleeping better and only taking half Ambien.  He is taking lamotrigine and that is helping his mood, irritability, mania, anger.  He has chronic anxiety but no major panic attack.  He reported son is doing so-so but at least he is keeping his job and live on his own.  He reported his kids are doing okay.  He is expecting full house on the Christmas.  His daughter is going to come from [place].  Reported some anxiety and nervousness but manageable.  He has no rash or any itching.  He wants to continue Lamictal and Ambien as needed.  Past Psychiatric History: H/O depression, mood swing, anger and Inpatient at [facility] due to suicidal thoughts but no attempt.  No h/o psychosis but h/o poor impulse control.  Tried Klonopin but did not work.  Took Seroquel, Cymbalta and Remeron (increase depression)        Outpatient Encounter Medications as of 07/02/2023  Medication Sig   acetaminophen (TYLENOL) 500 MG tablet Take by mouth.   albuterol (VENTOLIN HFA) 108 (90 Base) MCG/ACT inhaler TAKE 2 PUFFS BY MOUTH EVERY 6 HOURS AS NEEDED FOR WHEEZE OR SHORTNESS OF BREATH   amLODipine (NORVASC) 5 MG tablet Take 1 tablet (5 mg total) by mouth daily.   aspirin EC 81 MG tablet Take 1 tablet by mouth daily.   atorvastatin (LIPITOR) 40 MG tablet TAKE 1 TABLET BY MOUTH EVERY DAY   AXIRON 30 MG/ACT SOLN Place 2 Act onto the skin daily.   brimonidine (ALPHAGAN) 0.2 % ophthalmic solution SMARTSIG:In Eye(s)   busPIRone (BUSPAR) 5 MG tablet TAKE 1 TABLET BY MOUTH EVERYDAY AT BEDTIME   clobetasol cream (TEMOVATE) 0.05 % Apply topically.   Coenzyme Q10 (CO Q 10 PO) Take 1 capsule by mouth daily.    doxycycline (VIBRA-TABS) 100 MG tablet Take 1 tablet (100 mg total) by mouth 2 (two) times daily.   EPINEPHrine 0.3 mg/0.3 mL IJ SOAJ injection Inject 0.3 mg into the muscle as needed.   erythromycin ophthalmic ointment Place into the right eye at bedtime.   furosemide (LASIX) 20 MG tablet TAKE 1 TO 2 TABLETS BY MOUTH DAILY AS NEEDED   Immune Globulin, Human, 4 GM/20ML SOLN Inject into the skin once a week. wednesday   LamoTRIgine 300 MG TB24 24 hour tablet Take 1 tablet (300 mg total) by mouth every morning.  latanoprost (XALATAN) 0.005 % ophthalmic solution 1 DROP EACH EYE AT NIGHT   levothyroxine (SYNTHROID) 150 MCG tablet Take 1 tablet (150 mcg total) by mouth daily before breakfast.   LORazepam (ATIVAN) 0.5 MG tablet Take one tab daily and 2nd if needed for anxiety   meloxicam (MOBIC) 15 MG tablet TAKE 1 TABLET BY MOUTH EVERY DAY AS NEEDED   Multiple Vitamin (MULTI-VITAMINS) TABS Take by mouth.   OMEGA-3 KRILL OIL PO Take 1 capsule by mouth daily.    omeprazole (PRILOSEC) 20 MG capsule Take 20 mg by mouth every evening.   sildenafil (REVATIO) 20 MG tablet Take 1 tablet (20 mg total) by mouth as needed.    tadalafil (CIALIS) 5 MG tablet Take 5 mg by mouth.   tamsulosin (FLOMAX) 0.4 MG CAPS capsule TAKE 1 CAPSULE BY MOUTH EVERY DAY (Patient taking differently: as needed.)   telmisartan (MICARDIS) 80 MG tablet Take 1 tablet (80 mg total) by mouth daily.   timolol (TIMOPTIC) 0.5 % ophthalmic solution timolol maleate 0.5 % eye drops  INSTILL 1 DROP INTO RIGHT EYE TWICE A DAY   traMADol (ULTRAM) 50 MG tablet Take 50 mg by mouth as needed.   triamcinolone ointment (KENALOG) 0.5 % Apply 1 Application topically 4 (four) times daily. On the lip   zolpidem (AMBIEN) 10 MG tablet TAKE 1/2 (ONE-HALF) TABLET BY MOUTH AT BEDTIME AND  TAKE  THE  OTHER  HALF  IF  NEEDED   No facility-administered encounter medications on file as of 07/02/2023.    Recent Results (from the past 2160 hour(s))  Comprehensive metabolic panel     Status: Abnormal   Collection Time: 04/11/23 10:56 AM  Result Value Ref Range   Sodium 135 135 - 145 mmol/L   Potassium 3.9 3.5 - 5.1 mmol/L   Chloride 105 98 - 111 mmol/L   CO2 21 (L) 22 - 32 mmol/L   Glucose, Bld 119 (H) 70 - 99 mg/dL    Comment: Glucose reference range applies only to samples taken after fasting for at least 8 hours.   BUN 27 (H) 8 - 23 mg/dL   Creatinine, Ser 1.61 0.61 - 1.24 mg/dL   Calcium 8.4 (L) 8.9 - 10.3 mg/dL   Total Protein 6.8 6.5 - 8.1 g/dL   Albumin 3.9 3.5 - 5.0 g/dL   AST 23 15 - 41 U/L   ALT 37 0 - 44 U/L   Alkaline Phosphatase 70 38 - 126 U/L   Total Bilirubin 0.9 0.3 - 1.2 mg/dL   GFR, Estimated >09 >60 mL/min    Comment: (NOTE) Calculated using the CKD-EPI Creatinine Equation (2021)    Anion gap 9 5 - 15    Comment: Performed at [facility], [address] [place], [name] [address]  CBC with Differential     Status: Abnormal   Collection Time: 04/11/23 10:56 AM  Result Value Ref Range   WBC 12.6 (H) 4.0 - 10.5 K/uL   RBC 5.23 4.22 - 5.81 MIL/uL   Hemoglobin 14.5 13.0 - 17.0 g/dL   HCT 81.1 91.4 - 78.2 %   MCV  84.1 80.0 - 100.0 fL   MCH 27.7 26.0 - 34.0 pg   MCHC 33.0 30.0 - 36.0 g/dL   RDW 95.6 (H) 21.3 - 08.6 %   Platelets 186 150 - 400 K/uL   nRBC 0.0 0.0 - 0.2 %   Neutrophils Relative % 71 %   Neutro Abs 9.1 (H) 1.7 - 7.7 K/uL  Lymphocytes Relative 14 %   Lymphs Abs 1.7 0.7 - 4.0 K/uL   Monocytes Relative 11 %   Monocytes Absolute 1.4 (H) 0.1 - 1.0 K/uL   Eosinophils Relative 2 %   Eosinophils Absolute 0.3 0.0 - 0.5 K/uL   Basophils Relative 1 %   Basophils Absolute 0.1 0.0 - 0.1 K/uL   Immature Granulocytes 1 %   Abs Immature Granulocytes 0.13 (H) 0.00 - 0.07 K/uL    Comment: Performed at [facility], [address], [place], [name] [address]  T3, free     Status: None   Collection Time: 05/12/23  2:35 PM  Result Value Ref Range   T3, Free 2.3 2.3 - 4.2 pg/mL  T4, free     Status: None   Collection Time: 05/12/23  2:35 PM  Result Value Ref Range   Free T4 0.96 0.60 - 1.60 ng/dL    Comment: Specimens from patients who are undergoing biotin therapy and /or ingesting biotin supplements may contain high levels of biotin.  The higher biotin concentration in these specimens interferes with this Free T4 assay.  Specimens that contain high levels  of biotin may cause false high results for this Free T4 assay.  Please interpret results in light of the total clinical presentation of the patient.    TSH     Status: None   Collection Time: 05/12/23  2:35 PM  Result Value Ref Range   TSH 4.05 0.35 - 5.50 uIU/mL  Lipid panel     Status: None   Collection Time: 05/12/23  2:35 PM  Result Value Ref Range   Cholesterol 183 0 - 200 mg/dL    Comment: ATP III Classification       Desirable:  < 200 mg/dL               Borderline High:  200 - 239 mg/dL          High:  > = 604 mg/dL   Triglycerides 540.9 0.0 - 149.0 mg/dL    Comment: Normal:  <811 mg/dLBorderline High:  150 - 199 mg/dL   HDL 91.47 >82.95 mg/dL   VLDL 62.1 0.0 - 30.8 mg/dL   LDL Cholesterol 74 0 - 99 mg/dL    Total CHOL/HDL Ratio 2     Comment:                Men          Women1/2 Average Risk     3.4          3.3Average Risk          5.0          4.42X Average Risk          9.6          7.13X Average Risk          15.0          11.0                       NonHDL 101.78     Comment: NOTE:  Non-HDL goal should be 30 mg/dL higher than patient's LDL goal (i.e. LDL goal of < 70 mg/dL, would have non-HDL goal of < 100 mg/dL)  Comprehensive metabolic panel     Status: None   Collection Time: 05/12/23  2:35 PM  Result Value Ref Range   Sodium 138 135 - 145 mEq/L   Potassium 3.9 3.5 - 5.1  mEq/L   Chloride 104 96 - 112 mEq/L   CO2 25 19 - 32 mEq/L   Glucose, Bld 96 70 - 99 mg/dL   BUN 18 6 - 23 mg/dL   Creatinine, Ser 0.98 0.40 - 1.50 mg/dL   Total Bilirubin 0.6 0.2 - 1.2 mg/dL   Alkaline Phosphatase 74 39 - 117 U/L   AST 30 0 - 37 U/L   ALT 31 0 - 53 U/L   Total Protein 6.7 6.0 - 8.3 g/dL   Albumin 4.0 3.5 - 5.2 g/dL   GFR 11.91 >47.82 mL/min    Comment: Calculated using the CKD-EPI Creatinine Equation (2021)   Calcium 8.7 8.4 - 10.5 mg/dL  CBC with Differential/Platelet     Status: Abnormal   Collection Time: 05/12/23  2:35 PM  Result Value Ref Range   WBC 8.0 4.0 - 10.5 K/uL   RBC 5.10 4.22 - 5.81 Mil/uL   Hemoglobin 13.8 13.0 - 17.0 g/dL   HCT 95.6 21.3 - 08.6 %   MCV 84.0 78.0 - 100.0 fl   MCHC 32.3 30.0 - 36.0 g/dL   RDW 57.8 (H) 46.9 - 62.9 %   Platelets 266.0 150.0 - 400.0 K/uL   Neutrophils Relative % 66.3 43.0 - 77.0 %   Lymphocytes Relative 18.1 12.0 - 46.0 %   Monocytes Relative 12.1 (H) 3.0 - 12.0 %   Eosinophils Relative 2.6 0.0 - 5.0 %   Basophils Relative 0.9 0.0 - 3.0 %   Neutro Abs 5.3 1.4 - 7.7 K/uL   Lymphs Abs 1.4 0.7 - 4.0 K/uL   Monocytes Absolute 1.0 0.1 - 1.0 K/uL   Eosinophils Absolute 0.2 0.0 - 0.7 K/uL   Basophils Absolute 0.1 0.0 - 0.1 K/uL     Psychiatric Specialty Exam: Physical Exam  Review of Systems  Weight 177 lb (80.3 kg).There is no height or  weight on file to calculate BMI.  General Appearance: NA  Eye Contact:  NA  Speech:  Normal Rate  Volume:  Normal  Mood:  Dysphoric  Affect:  NA  Thought Process:  Goal Directed  Orientation:  Full (Time, Place, and Person)  Thought Content:  Rumination  Suicidal Thoughts:  No  Homicidal Thoughts:  No  Memory:  Immediate;   Good Recent;   Good Remote;   Fair  Judgement:  Intact  Insight:  Present  Psychomotor Activity:  Normal  Concentration:  Concentration: Good and Attention Span: Good  Recall:  Good  Fund of Knowledge:  Good  Language:  Good  Akathisia:  No  Handed:  Right  AIMS (if indicated):     Assets:  Communication Skills Desire for Improvement Housing Transportation  ADL's:  Intact  Cognition:  WNL  Sleep:  better with Ambien      Assessment/Plan: Bipolar I disorder (HCC) - Plan: LamoTRIgine 300 MG TB24 24 hour tablet  GAD (generalized anxiety disorder) - Plan: LamoTRIgine 300 MG TB24 24 hour tablet, zolpidem (AMBIEN) 10 MG tablet  I reviewed blood work results and collateral information from other providers.  His labs are stable and okay.  Discontinue BuSpar as patient did not tolerate very well and had side effects.  Continue lamotrigine 300 mg daily and Ambien half tablet as needed for insomnia and anxiety.  Patient is in therapy with Lavada Mesi.  Recommended to call us back if any question of any concern.  He has not taken the Ativan in the past 3 months.  We will follow-up in 3 months.  Follow Up Instructions:     I discussed the assessment and treatment plan with the patient. The patient was provided an opportunity to ask questions and all were answered. The patient agreed with the plan and demonstrated an understanding of the instructions.   The patient was advised to call back or seek an in-person evaluation if the symptoms worsen or if the condition fails to improve as anticipated.    Collaboration of Care: Other provider involved in patient's  care AEB notes are available in epic to review  Patient/Guardian was advised Release of Information must be obtained prior to any record release in order to collaborate their care with an outside provider. Patient/Guardian was advised if they have not already done so to contact the registration department to sign all necessary forms in order for [place] to release information regarding their care.   Consent: Patient/Guardian gives verbal consent for treatment and assignment of benefits for services provided during this visit. Patient/Guardian expressed understanding and agreed to proceed.     I provided 24 minutes of non face to face time during this encounter.  Note: This document was prepared by [employer] voice dictation technology and any errors that results from this process are unintentional.    Cleotis Nipper, MD 07/02/2023

## 2023-07-03 ENCOUNTER — Telehealth: Payer: Self-pay | Admitting: *Deleted

## 2023-07-03 ENCOUNTER — Ambulatory Visit: Payer: BLUE CROSS/BLUE SHIELD | Admitting: Internal Medicine

## 2023-07-03 ENCOUNTER — Ambulatory Visit (HOSPITAL_COMMUNITY): Payer: BLUE CROSS/BLUE SHIELD | Admitting: Licensed Clinical Social Worker

## 2023-07-03 DIAGNOSIS — M67441 Ganglion, right hand: Secondary | ICD-10-CM | POA: Diagnosis not present

## 2023-07-03 DIAGNOSIS — M25521 Pain in right elbow: Secondary | ICD-10-CM | POA: Diagnosis not present

## 2023-07-03 NOTE — Telephone Encounter (Signed)
   Pre-operative Risk Assessment    Patient Name: Alex [name]  DOB: [birth year] MRN: [id number]  DATE OF LAST VISIT: 04/01/23 DR. CRENSHAW DATE OF NEXT VISIT: NONE    Request for Surgical Clearance    Procedure:   RIGHT SMALL FINGER  DISTAL INTERPHALANGEAL JOINT ARTHRODESIS  Date of Surgery:  Clearance TBD                                 Surgeon:  DR. FRED [facility] Surgeon's Group or Practice Name:  [facility] Phone number:  [phone] Fax number:  [phone]   Type of Clearance Requested:   - Medical  - Pharmacy:  Hold Aspirin     Type of Anesthesia:  Not Indicated   Additional requests/questions:    Elpidio Anis   07/03/2023, 4:44 PM

## 2023-07-06 ENCOUNTER — Encounter (HOSPITAL_COMMUNITY): Payer: Self-pay | Admitting: Licensed Clinical Social Worker

## 2023-07-06 NOTE — Progress Notes (Signed)
Virtual virtual Video Note  I connected with Amod Liquori Bole on 06/18/2023 at 3-4pm EST by video-enabled virtual visit. I verified that I am speaking with the correct person using  two identifiers.I discussed the limitations of evaluation and management by telemedicine and the availability of in person appointments. The patient expressed understanding and agreed to proceed.    LOCATION: Patient: Home  Provider: Home office   History of Present Illness:  Pt was referred by Dr. Lolly Mustache for OP therapy for bipolar disorder and anxiety.  Treatment Goal Addressed:  Pt will meet with clinician 1x every 2 weeks for therapy to monitor for progress towards goals and address any barriers to success; Reduce depression from average severity level of 6/10 down to a 4/10 in next 90 days by engaging in 1-2 positive coping skills daily as part of developing self-care routine; Reduce average anxiety level from 7/10 down to 5/10 in next 90 days by utilizing 1-2 relaxation skills/grounding skills per day, such as mindful breathing, progressive muscle relaxation, positive visualizations.  Progress towards Treatment Goal: Progressing   Observations/Objective: Patient presented for today's session on time and was alert, oriented x5, with no evidence or self-report of SI/HI or A/V H.  Patient reported ongoing compliance with medication and denied any use of alcohol or illicit substances.  Clinician inquired about patient's current emotional ratings, as well as any significant changes in thoughts, feelings or behavior since previous session. Patient reported scores of  5/10 for depression, 5/10 for anxiety, 3/10 for anger/irritability. Pt explored his emotional ratings, and discussed coping skills for stressors. Pt identifies stressors and  allowed pt to explore and express thoughts and feelings associated with recent life situations and external stressors.[name] and pt reviewed his continued stressors individually: marital  issues, son's mental health, physical health, future, pt's sexual health after prostate cancer, lack of marital communication.  Pt's stressors have continued for a long period of time. [name] provided psychoeducation teaching the pt to become better at influencing his moods and behavior in a positive way by eliminating unhelpful or irrational thought patterns, he can greatly reduce his overall unhappiness while also becoming more prone to focusing on the good in his life.  Assessment and plan: Counselor will continue to meet with patient to address treatment plan goals. Patient will continue to follow recommendations of providers and imnd implement skills learned in session, and practice between sessions. Diagnosis: Bipolar 1 disorder.    Collaboration of care: Other:   Continue working with providers    Patient/Guardian was advised Release of Information must be obtained prior to any record release in order to collaborate their care with an outside provider. Patient/Guardian was advised if they have not already done so to contact the registration department to sign all necessary forms in order for [place] to release information regarding their care.    Consent: Patient/Guardian gives verbal consent for treatment and assignment of benefits for services provided during this visit. Patient/Guardian expressed understanding and agreed to proceed.     Follow Up Instructions:  I discussed the assessment and treatment plan with the patient. The patient was provided an opportunity to ask questions and all were answered. The patient agreed with the plan and demonstrated an understanding of the instructions.   The patient was advised to call back or seek an in-person evaluation if the symptoms worsen or if the condition fails to improve as anticipated.  I provided 60 minutes of non-face-to-face time during this encounter.   Ruthanna Macchia S, LCAS  06/18/23 

## 2023-07-07 ENCOUNTER — Telehealth: Payer: Self-pay | Admitting: *Deleted

## 2023-07-07 DIAGNOSIS — H3581 Retinal edema: Secondary | ICD-10-CM | POA: Diagnosis not present

## 2023-07-07 DIAGNOSIS — T66XXXS Radiation sickness, unspecified, sequela: Secondary | ICD-10-CM | POA: Diagnosis not present

## 2023-07-07 DIAGNOSIS — H3589 Other specified retinal disorders: Secondary | ICD-10-CM | POA: Diagnosis not present

## 2023-07-07 DIAGNOSIS — H04121 Dry eye syndrome of right lacrimal gland: Secondary | ICD-10-CM | POA: Diagnosis not present

## 2023-07-07 DIAGNOSIS — H353121 Nonexudative age-related macular degeneration, left eye, early dry stage: Secondary | ICD-10-CM | POA: Diagnosis not present

## 2023-07-07 NOTE — Telephone Encounter (Signed)
   Name: Alex [name]  DOB: 03-Jun-1956  MRN: [id number]  Primary Cardiologist: Olga Millers, MD  Chart reviewed as part of pre-operative protocol coverage. Because of Alex [name]'s past medical history and time since last visit, he will require a follow-up telephone visit in order to better assess preoperative cardiovascular risk.  Pre-op covering staff: - Please schedule appointment and call patient to inform them. If patient already had an upcoming appointment within acceptable timeframe, please add "pre-op clearance" to the appointment notes so provider is aware. - Please contact requesting surgeon's office via preferred method (i.e, phone, fax) to inform them of need for appointment prior to surgery.  He is on aspirin for nonobstructive CAD.  Can hold for 5 to 7 days prior to procedure and resume when medically safe to do so.  Sharlene Dory, PA-C  07/07/2023, 3:33 PM

## 2023-07-07 NOTE — Telephone Encounter (Signed)
Pt has tele pre op appt 07/17/23. Med rec and  consent are done.

## 2023-07-07 NOTE — Telephone Encounter (Signed)
Per pt the surgery is planned for 07/28/23.

## 2023-07-07 NOTE — Telephone Encounter (Signed)
Pt has tele pre op appt 07/17/23. Med rec and  consent are done.     Patient Consent for Virtual Visit        Alex [name] has provided verbal consent on 07/07/2023 for a virtual visit (video or telephone).   CONSENT FOR VIRTUAL VISIT FOR:  Alex [name]  By participating in this virtual visit I agree to the following:  I hereby voluntarily request, consent and authorize [facility] and its employed or contracted physicians, physician assistants, nurse practitioners or other licensed health care professionals (the Practitioner), to provide me with telemedicine health care services (the "Services") as deemed necessary by the treating Practitioner. I acknowledge and consent to receive the Services by the Practitioner via telemedicine. I understand that the telemedicine visit will involve communicating with the Practitioner through live audiovisual communication technology and the disclosure of certain medical information by electronic transmission. I acknowledge that I have been given the opportunity to request an in-person assessment or other available alternative prior to the telemedicine visit and am voluntarily participating in the telemedicine visit.  I understand that I have the right to withhold or withdraw my consent to the use of telemedicine in the course of my care at any time, without affecting my right to future care or treatment, and that the Practitioner or I may terminate the telemedicine visit at any time. I understand that I have the right to inspect all information obtained and/or recorded in the course of the telemedicine visit and may receive copies of available information for a reasonable fee.  I understand that some of the potential risks of receiving the Services via telemedicine include:  Delay or interruption in medical evaluation due to technological equipment failure or disruption; Information transmitted may not be sufficient (e.g. poor resolution of  images) to allow for appropriate medical decision making by the Practitioner; and/or  In rare instances, security protocols could fail, causing a breach of personal health information.  Furthermore, I acknowledge that it is my responsibility to provide information about my medical history, conditions and care that is complete and accurate to the best of my ability. I acknowledge that Practitioner's advice, recommendations, and/or decision may be based on factors not within their control, such as incomplete or inaccurate data provided by me or distortions of diagnostic images or specimens that may result from electronic transmissions. I understand that the practice of medicine is not an exact science and that Practitioner makes no warranties or guarantees regarding treatment outcomes. I acknowledge that a copy of this consent can be made available to me via my patient portal [facility] MyChart), or I can request a printed copy by calling the office of [facility].    I understand that my insurance will be billed for this visit.   I have read or had this consent read to me. I understand the contents of this consent, which adequately explains the benefits and risks of the Services being provided via telemedicine.  I have been provided ample opportunity to ask questions regarding this consent and the Services and have had my questions answered to my satisfaction. I give my informed consent for the services to be provided through the use of telemedicine in my medical care

## 2023-07-08 ENCOUNTER — Ambulatory Visit (HOSPITAL_COMMUNITY): Payer: BLUE CROSS/BLUE SHIELD | Admitting: Licensed Clinical Social Worker

## 2023-07-08 ENCOUNTER — Ambulatory Visit: Payer: BLUE CROSS/BLUE SHIELD | Admitting: Internal Medicine

## 2023-07-08 ENCOUNTER — Encounter (HOSPITAL_COMMUNITY): Payer: Self-pay | Admitting: Licensed Clinical Social Worker

## 2023-07-08 DIAGNOSIS — F319 Bipolar disorder, unspecified: Secondary | ICD-10-CM

## 2023-07-08 DIAGNOSIS — F411 Generalized anxiety disorder: Secondary | ICD-10-CM | POA: Diagnosis not present

## 2023-07-08 NOTE — Progress Notes (Signed)
Virtual virtual Video Note  I connected with Alex [name] on 07/08/2023 at 4-5pm EST by video-enabled virtual visit. I verified that I am speaking with the correct person using  two identifiers.I discussed the limitations of evaluation and management by telemedicine and the availability of in person appointments. The patient expressed understanding and agreed to proceed.    LOCATION: Patient: Home  Provider: Home office   History of Present Illness:  Pt was referred by Dr. Lolly Mustache for OP therapy for bipolar disorder and anxiety.  Treatment Goal Addressed:  Pt will meet with clinician 1x every 2 weeks for therapy to monitor for progress towards goals and address any barriers to success; Reduce depression from average severity level of 6/10 down to a 4/10 in next 90 days by engaging in 1-2 positive coping skills daily as part of developing self-care routine; Reduce average anxiety level from 7/10 down to 5/10 in next 90 days by utilizing 1-2 relaxation skills/grounding skills per day, such as mindful breathing, progressive muscle relaxation, positive visualizations.  Progress towards Treatment Goal: Progressing   Observations/Objective: Patient presented for today's session on time and was alert, oriented x5, with no evidence or self-report of SI/HI or A/V H.  Patient reported ongoing compliance with medication and denied any use of alcohol or illicit substances.  Clinician inquired about patient's current emotional ratings, as well as any significant changes in thoughts, feelings or behavior since previous session. Patient reported scores of  5/10 for depression, 5/10 for anxiety, 3/10 for anger/irritability. Pt explored his emotional ratings, and discussed coping skills for stressors. Pt identifies stressors and  allowed pt to explore and express thoughts and feelings associated with recent life situations and external stressors.[name] and pt reviewed his continued stressors individually: marital  issues, son's mental health, physical health, future, pt's sexual health after prostate cancer, lack of marital communication.  Pt's stressors have continued for a long period of time. Again, [name] provided psychoeducation teaching the pt to become better at influencing his moods and behavior in a positive way by eliminating unhelpful or irrational thought patterns, he can greatly reduce his overall unhappiness while also becoming more prone to focusing on the good in his life. [name] and pt reviewed his moods, how to influence them in a positive way and how to have healthier thoughts..  Assessment and plan: Counselor will continue to meet with patient to address treatment plan goals. Patient will continue to follow recommendations of providers and imnd implement skills learned in session, and practice between sessions. Diagnosis: Bipolar 1 disorder.    Collaboration of care: Other:   Continue working with providers    Patient/Guardian was advised Release of Information must be obtained prior to any record release in order to collaborate their care with an outside provider. Patient/Guardian was advised if they have not already done so to contact the registration department to sign all necessary forms in order for [place] to release information regarding their care.    Consent: Patient/Guardian gives verbal consent for treatment and assignment of benefits for services provided during this visit. Patient/Guardian expressed understanding and agreed to proceed.     Follow Up Instructions:  I discussed the assessment and treatment plan with the patient. The patient was provided an opportunity to ask questions and all were answered. The patient agreed with the plan and demonstrated an understanding of the instructions.   The patient was advised to call back or seek an in-person evaluation if the symptoms worsen or if the condition fails  to improve as anticipated.  I provided 60 minutes of non-face-to-face time  during this encounter.   Blu Mcglaun S, LCAS  07/08/23

## 2023-07-09 ENCOUNTER — Ambulatory Visit (HOSPITAL_COMMUNITY): Payer: BLUE CROSS/BLUE SHIELD | Admitting: Licensed Clinical Social Worker

## 2023-07-09 DIAGNOSIS — M47816 Spondylosis without myelopathy or radiculopathy, lumbar region: Secondary | ICD-10-CM | POA: Diagnosis not present

## 2023-07-10 ENCOUNTER — Encounter (HOSPITAL_COMMUNITY): Payer: Self-pay | Admitting: Licensed Clinical Social Worker

## 2023-07-10 NOTE — Progress Notes (Signed)
Virtual virtual Video Note  I connected with Alex [name] on 1112/2024 at 4-5pm EST by video-enabled virtual visit. I verified that I am speaking with the correct person using  two identifiers.I discussed the limitations of evaluation and management by telemedicine and the availability of in person appointments. The patient expressed understanding and agreed to proceed.    LOCATION: Patient: Home  Provider: Home office   History of Present Illness:  Pt was referred by Dr. Lolly Mustache for OP therapy for bipolar disorder and anxiety.  Treatment Goal Addressed:  Pt will meet with clinician 1x every 2 weeks for therapy to monitor for progress towards goals and address any barriers to success; Reduce depression from average severity level of 6/10 down to a 4/10 in next 90 days by engaging in 1-2 positive coping skills daily as part of developing self-care routine; Reduce average anxiety level from 7/10 down to 5/10 in next 90 days by utilizing 1-2 relaxation skills/grounding skills per day, such as mindful breathing, progressive muscle relaxation, positive visualizations.  Progress towards Treatment Goal: Progressing   Observations/Objective: Patient presented for today's session on time and was alert, oriented x5, with no evidence or self-report of SI/HI or A/V H.  Patient reported ongoing compliance with medication and denied any use of alcohol or illicit substances.  Clinician inquired about patient's current emotional ratings, as well as any significant changes in thoughts, feelings or behavior since previous session. Patient reported scores of  5/10 for depression, 5/10 for anxiety, 3/10 for anger/irritability. Pt explored his emotional ratings, and discussed coping skills for stressors. Pt identifies stressors and  allowed pt to explore and express thoughts and feelings associated with recent life situations and external stressors.[name] and pt reviewed his continued stressors individually: marital  issues, son's mental health, physical health, future, pt's sexual health after prostate cancer, lack of marital communication.  Pt's stressors have continued for a long period of time. [name] used CBT to assist in identifying positives to assess how his mood has been impacted. Clinician allowed space for patient to further process emotions and clinician provided supportive statements throughout session. It was suggested to patient to continue to identify positives that  impact mood daily.     Assessment and plan: Counselor will continue to meet with patient to address treatment plan goals. Patient will continue to follow recommendations of providers and imnd implement skills learned in session, and practice between sessions. Diagnosis: Bipolar 1 disorder.    Collaboration of care: Other:   Continue working with providers    Patient/Guardian was advised Release of Information must be obtained prior to any record release in order to collaborate their care with an outside provider. Patient/Guardian was advised if they have not already done so to contact the registration department to sign all necessary forms in order for [place] to release information regarding their care.    Consent: Patient/Guardian gives verbal consent for treatment and assignment of benefits for services provided during this visit. Patient/Guardian expressed understanding and agreed to proceed.     Follow Up Instructions:  I discussed the assessment and treatment plan with the patient. The patient was provided an opportunity to ask questions and all were answered. The patient agreed with the plan and demonstrated an understanding of the instructions.   The patient was advised to call back or seek an in-person evaluation if the symptoms worsen or if the condition fails to improve as anticipated.  I provided 60 minutes of non-face-to-face time during this encounter.  Alexiz Sustaita S, LCAS  06/24/23

## 2023-07-16 ENCOUNTER — Ambulatory Visit (HOSPITAL_COMMUNITY): Payer: BLUE CROSS/BLUE SHIELD | Admitting: Licensed Clinical Social Worker

## 2023-07-16 DIAGNOSIS — F319 Bipolar disorder, unspecified: Secondary | ICD-10-CM

## 2023-07-16 NOTE — Progress Notes (Unsigned)
Virtual Visit via Telephone Note   Because of Alex [name]'s co-morbid illnesses, he is at least at moderate risk for complications without adequate follow up.  This format is felt to be most appropriate for this patient at this time.  The patient did not have access to video technology/had technical difficulties with video requiring transitioning to audio format only (telephone).  All issues noted in this document were discussed and addressed.  No physical exam could be performed with this format.  Please refer to the patient's chart for his consent to telehealth for [facility]  Evaluation Performed:  Preoperative cardiovascular risk assessment _____________   Date:  07/16/2023   Patient ID:  Alex [name], DOB [birth year], MRN [id number] Patient Location:  Home Provider location:   Office  Primary Care Provider:  Tresa Garter, MD Primary Cardiologist:  Olga Millers, MD  Chief Complaint / Patient Profile   67 y.o. y/o male with a h/o coronary artery disease, hypertension, hyperlipidemia who is pending  RIGHT SMALL FINGER  DISTAL INTERPHALANGEAL JOINT ARTHRODESIS  and presents today for telephonic preoperative cardiovascular risk assessment.  History of Present Illness    Alex [name] is a 67 y.o. male who presents via audio/video conferencing for a telehealth visit today.  Pt was last seen in cardiology clinic on 04/01/2023 by Dr. Jens Som.  At that time Alex [name] was doing well .  The patient is now pending procedure as outlined above. Since his last visit, he remains stable from a cardiac standpoint.  Today he denies chest pain, shortness of breath, lower extremity edema, fatigue, palpitations, melena, hematuria, hemoptysis, diaphoresis, weakness, presyncope, syncope, orthopnea, and PND.   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   Cataract    LEFT EYE   Chronic back pain    Complication of anesthesia POST URINARY RETENTION---  2006 SHOULDER SURGERY MARKED BRADYCARDIA VAGAL RESPONSE NO ISSUE W/ SURGERY AFTER THIS ONE   WITH GENERAL ANESTHESIA, 15 YRS AGO VASOVAGAL REACTION NONE SINCE   Corneal hemorrhage 06/03/2018   Entire left eye   Coronary atherosclerosis CARDIOLOGIST- DR CRENSHAW--  LAST VISIT 01-05-2012 IN EPIC   NON-OBSTRUCTIVE MILD DISEASE   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HCC)    Depression    Epicondylitis    right elbow   GERD (gastroesophageal reflux disease)    Glaucoma BOTH EYES   RIGHT EYE RADIATION DAMAGE   Hearing loss    BOTH EARS   Hearing loss    Bilateral   Hepatic cyst    Several, The lesion of concern in segment 6 of the liver has single large portal vein and hepatic vein branches extending to tt, in a pattern of enhancement which mirrors these vascular structures. 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 9 CM X 4. 3 CM PER NOV 06-14-17  CHEST CTFOLOWED BY DR Jens Som YEARLY FOR   Positional vertigo    HX OF WITH SINUS INFECTIONS   Radial head fracture    Right   Squamous cell carcinoma of skin 06/04/2016   in situ-crown of scalp   Squamous cell carcinoma of skin 04/22/2017   in situ-crown scalp (txpbx)   Thoracic aortic aneurysm (HCC) 06/14/2017   last CT 4.1 CM Mild   Tinnitus    CONSTANT   Ulnar nerve compression    right elbow   Unsteady gait    especially with stairs, depth perception off   Urinary hesitancy    Wears glasses    Past Surgical History:  Procedure Laterality Date   CARDIAC CATHETERIZATION  01-16-2006  DR Maisie Fus WALL   MILD CORONARY ATHEROSCLEROSIS/ MID TO DISTAL LAD 40% STENOSIS/ LVF 50-55%   CARPAL TUNNEL RELEASE Right 11/03/2017   Procedure: RIGHT HAND CARPAL TUNNEL RELEASE;  Surgeon: Bradly Bienenstock, MD;  Location: [facility] [place];  Service: Orthopedics;  Laterality: Right;   CATARACT EXTRACTION Right    COLONSCOPY  2017 LAST DONE   MULTIPLE   ENDOSCOPY  LAST 2017   MULTIPLE DONE DILATION DONE ALSO   ESOPHAGOGASTRODUODENOSCOPY (EGD) WITH PROPOFOL N/A 02/24/2018   Procedure: ESOPHAGOGASTRODUODENOSCOPY (EGD) WITH PROPOFOL;  Surgeon: Carman Ching, MD;  Location: [facility];  Service: Endoscopy;  Laterality: N/A;   EXCISION RADIAL HEAD Right 11/03/2017   Procedure: RIGHT PROXIMAL RADIUS RADIAL HEAD RESECTION AND JOINT DEBRIDEMENT;  Surgeon: Bradly Bienenstock, MD;  Location: [facility] [place];  Service: Orthopedics;  Laterality: Right;   EXTRACORPOREAL SHOCK WAVE LITHOTRIPSY Right 11/20/2020   Procedure: EXTRACORPOREAL SHOCK WAVE LITHOTRIPSY (ESWL);   Surgeon: Sebastian Ache, MD;  Location: [facility];  Service: Urology;  Laterality: Right;  75 MINS   KNEE ARTHROSCOPY  05/01/2012   Procedure: ARTHROSCOPY KNEE;  Surgeon: Javier Docker, MD;  Location: [facility];  Service: Orthopedics;  Laterality: Left;  debridement and removal of loose body   LEFT ANKLE ARTHROSCOPY W/ DEBRIDEMENT  05-12-2007   LEFT HYDROCELECTOMY  03-29-2005   AND REPAIR LEFT INGUINAL HERNIA W/ MESH   MOHS SURGERY  2002   RIGHT ORBITAL CANCER   NASAL ENDOSCOPY  08-07-2005   RIGHT EPISTAXIS  / POST SEPTOPLASTY  (HX RIGHT ORBITAL CA & S/P RADIATION/ NECROSIS ANTERIOR END OF BOTH INFERIOR TURBINATES)   occuloplastic surgery  2002   PARS PLANA VITRECTOMY  11-06-2004   RIGHT EYE RADIATION RETINOPATHY W/ HEMORRHAGE   RADIAL HEAD ARTHROPLASTY Right  06/15/2018   Procedure: RIGHT ELBOW PROXIMAL RADIOULNAR JOINT DEBRIDEMENT AND ARTHROPLASTY;  Surgeon: Bradly Bienenstock, MD;  Location: [facility] [place];  Service: Orthopedics;  Laterality: Right;  BLOCK WITH SEDATION   REPAIR UNDESENDED RIGHT TESTICLE / RIGHT INGUINAL HERNIA  AGE 45   RIGHT ANKLE ARTHROSCOPY W/ EXTENSIVE DEBRIDEMENT  04/05/2008   x2   RIGHT SHOULDER SURGERY  2006   RIGHT SUPRAOMOHYOID NECK DISSECTION   03-08-2003   ZONES 1,2,3;   SUBMANDIBULAR MASS / METASTATIC SQUAMOUS CELL CARCINOMA RIGHT NECK   SAVORY DILATION N/A 02/24/2018   Procedure: SAVORY DILATION;  Surgeon: Carman Ching, MD;  Location: [facility];  Service: Endoscopy;  Laterality: N/A;   SEPTOPLASTY  NOV 2006   SHOULDER ARTHROSCOPY Left    SHOULDER ARTHROSCOPY W/ SUBACROMIAL DECOMPRESSION AND DISTAL CLAVICLE EXCISION  10-09-2008   AND DEBRIDEMENT OF RIGHT SHOULDER IMPINGEMENT & [facility] JOINT ARTHRITIS   SPINE SURGERY  2016   l 3 TO l 4 PLATE AND SCREWS   TOTAL THYROIDECTOMY  11-03-2001   PAPILLARY THYROID CARCINOMA   TRANSTHORACIC ECHOCARDIOGRAM  12/ 2012   grade I diastolic dysfunction/ ef 55-60%   ULNAR NERVE  TRANSPOSITION Right 04/28/2014   Procedure: RIGHT ELBOW ULNA NERVE RELEASE TRANSPOSTION AND MEDIAL EPICONDYLAR DEBRIDEMENT AND REPAIR;  Surgeon: Sharma Covert, MD;  Location: [facility];  Service: Orthopedics;  Laterality: Right;    Allergies  No Known Allergies  Home Medications    Prior to Admission medications   Medication Sig Start Date End Date Taking? Authorizing Provider  acetaminophen (TYLENOL) 500 MG tablet Take by mouth.    [provider]  albuterol (VENTOLIN HFA) 108 (90 Base) MCG/ACT inhaler TAKE 2 PUFFS BY MOUTH EVERY 6 HOURS AS NEEDED FOR WHEEZE OR SHORTNESS OF BREATH 06/05/23   Plotnikov, Georgina Quint, MD  amLODipine (NORVASC) 5 MG tablet Take 1 tablet (5 mg total) by mouth daily. 03/12/23   Cannon Kettle, PA-C  aspirin EC 81 MG tablet Take 1 tablet by mouth daily.    [provider]  atorvastatin (LIPITOR) 40 MG tablet TAKE 1 TABLET BY MOUTH EVERY DAY 02/14/23   Crenshaw, Madolyn Frieze, MD  AXIRON 30 MG/ACT SOLN Place 2 Act onto the skin daily. 06/28/15   [provider]  brimonidine (ALPHAGAN) 0.2 % ophthalmic solution SMARTSIG:In Eye(s)    [provider]  busPIRone (BUSPAR) 5 MG tablet TAKE 1 TABLET BY MOUTH EVERYDAY AT BEDTIME 05/20/23   Arfeen, Phillips Grout, MD  Coenzyme Q10 (CO Q 10 PO) Take 1 capsule by mouth daily.     [provider]  doxycycline (VIBRA-TABS) 100 MG tablet Take 1 tablet (100 mg total) by mouth 2 (two) times daily. 04/24/23   Plotnikov, Georgina Quint, MD  EPINEPHrine 0.3 mg/0.3 mL IJ SOAJ injection Inject 0.3 mg into the muscle as needed.    [provider]  erythromycin ophthalmic ointment Place into the right eye at bedtime. 07/30/22   [provider]  furosemide (LASIX) 20 MG tablet TAKE 1 TO 2 TABLETS BY MOUTH DAILY AS NEEDED 05/26/23   Plotnikov, Georgina Quint, MD  Immune Globulin, Human, 4 GM/20ML SOLN Inject into the skin once a week. wednesday    [provider]  LamoTRIgine  300 MG TB24 24 hour tablet Take 1 tablet (300 mg total) by mouth every morning. 07/02/23   Arfeen, Phillips Grout, MD  latanoprost (XALATAN) 0.005 % ophthalmic solution 1 DROP EACH EYE AT NIGHT    [provider]  levothyroxine (SYNTHROID) 150 MCG tablet Take 1 tablet (150 mcg total) by mouth daily before breakfast. 11/20/22   Plotnikov, Georgina Quint, MD  LORazepam (ATIVAN) 0.5 MG tablet Take one tab daily and 2nd if needed for anxiety Patient not taking: Reported on 07/02/2023 04/10/23   Arfeen, Phillips Grout, MD  meloxicam (MOBIC) 15 MG tablet TAKE 1 TABLET BY MOUTH EVERY DAY AS NEEDED 02/28/22   Plotnikov, Georgina Quint, MD  Multiple Vitamin (MULTI-VITAMINS) TABS Take by mouth.    [provider]  OMEGA-3 KRILL OIL PO Take 1 capsule by mouth daily.     [provider]  omeprazole (PRILOSEC) 20 MG capsule Take 20 mg by mouth every evening.    [provider]  sildenafil (REVATIO) 20 MG tablet Take 1 tablet (20 mg total) by mouth as needed. 03/31/23   Pincus Sanes, MD  tadalafil (CIALIS) 5 MG tablet Take 5 mg by mouth.    [provider]  tamsulosin (FLOMAX) 0.4 MG CAPS capsule TAKE 1 CAPSULE BY MOUTH EVERY DAY Patient taking differently: as needed. 06/07/19   Plotnikov, Georgina Quint, MD  telmisartan (MICARDIS) 80 MG tablet Take 1 tablet (80 mg total) by mouth daily. 03/19/23   Lewayne Bunting, MD  timolol (TIMOPTIC) 0.5 % ophthalmic solution timolol maleate 0.5 % eye drops  INSTILL 1 DROP INTO RIGHT EYE TWICE A DAY 10/05/19   [provider]  traMADol (ULTRAM) 50 MG tablet Take 50 mg by mouth as needed.    [provider]  triamcinolone ointment (KENALOG) 0.5 % Apply 1 Application topically 4 (four) times daily. On the lip 11/20/22 11/20/23  Plotnikov, Georgina Quint, MD  zolpidem (AMBIEN) 10 MG tablet TAKE 1/2 (ONE-HALF) TABLET BY MOUTH AT BEDTIME AND  TAKE  THE  OTHER  HALF  IF  NEEDED 07/02/23   Arfeen, Phillips Grout, MD    Physical Exam    Vital Signs:  Judie Bonus  Mcavoy does not have vital signs available for review today.  Given telephonic nature of communication, physical exam is limited. AAOx3. NAD. Normal affect.  Speech and respirations are unlabored.  Accessory Clinical Findings    None  Assessment & Plan    1.  Preoperative Cardiovascular Risk Assessment: RIGHT SMALL FINGER  DISTAL INTERPHALANGEAL JOINT ARTHRODESIS, DR. FRED [facility] Surgeon's Group or Practice Name:  [facility] Phone number:  [phone] Fax number:  [phone]      Primary Cardiologist: Olga Millers, MD  Chart reviewed as part of pre-operative protocol coverage. Given past medical history and time since last visit, based on ACC/AHA guidelines, JUEL MCCUNE would be at acceptable risk for the planned procedure without further cardiovascular testing.   His RCRI is moderate risk, 6.6% risk of major cardiac event.  He is able to complete greater than 4 METS of physical activity.  Patient was advised that if he develops new symptoms prior to surgery to contact our office to arrange a follow-up appointment.  He verbalized understanding.  His aspirin may be held for 5 to 7 days prior to his procedure.  Please resume as soon as hemostasis is achieved.  I will route this recommendation to the requesting party via Epic fax function and remove from pre-op pool.   07/17/2023, 7:34 AM     Time:   Today, I have spent 10 minutes with the patient with telehealth technology discussing medical history, symptoms, and management plan.     Ronney Asters, NP  07/16/2023, 8:41 AM  Prior to patient's phone evaluation I spent greater than 10 minutes reviewing their past medical history and cardiac medications.

## 2023-07-17 ENCOUNTER — Ambulatory Visit: Payer: BLUE CROSS/BLUE SHIELD | Attending: Internal Medicine

## 2023-07-17 DIAGNOSIS — Z01810 Encounter for preprocedural cardiovascular examination: Secondary | ICD-10-CM | POA: Diagnosis not present

## 2023-07-18 ENCOUNTER — Other Ambulatory Visit: Payer: Self-pay | Admitting: Internal Medicine

## 2023-07-23 ENCOUNTER — Encounter (HOSPITAL_COMMUNITY): Payer: Self-pay | Admitting: Licensed Clinical Social Worker

## 2023-07-23 ENCOUNTER — Ambulatory Visit (INDEPENDENT_AMBULATORY_CARE_PROVIDER_SITE_OTHER): Payer: BLUE CROSS/BLUE SHIELD | Admitting: Licensed Clinical Social Worker

## 2023-07-23 DIAGNOSIS — F319 Bipolar disorder, unspecified: Secondary | ICD-10-CM | POA: Diagnosis not present

## 2023-07-23 NOTE — Progress Notes (Signed)
Virtual virtual Video Note  I connected with Machi Vanpatten Scheiderer on 07/16/2023 at 3-4pm EST by video-enabled virtual visit. I verified that I am speaking with the correct person using  two identifiers.I discussed the limitations of evaluation and management by telemedicine and the availability of in person appointments. The patient expressed understanding and agreed to proceed.    LOCATION: Patient: Home  Provider: Home office   History of Present Illness:  Pt was referred by Dr. Lolly Mustache for OP therapy for bipolar disorder and anxiety.  Treatment Goal Addressed:  Pt will meet with clinician 1x every 2 weeks for therapy to monitor for progress towards goals and address any barriers to success; Reduce depression from average severity level of 6/10 down to a 4/10 in next 90 days by engaging in 1-2 positive coping skills daily as part of developing self-care routine; Reduce average anxiety level from 7/10 down to 5/10 in next 90 days by utilizing 1-2 relaxation skills/grounding skills per day, such as mindful breathing, progressive muscle relaxation, positive visualizations.  Progress towards Treatment Goal: Progressing   Observations/Objective: Patient presented for today's session on time and was alert, oriented x5, with no evidence or self-report of SI/HI or A/V H.  Patient reported ongoing compliance with medication and denied any use of alcohol or illicit substances.  Clinician inquired about patient's current emotional ratings, as well as any significant changes in thoughts, feelings or behavior since previous session. Patient reported scores of  5/10 for depression, 5/10 for anxiety, 3/10 for anger/irritability. Pt explored his emotional ratings, and discussed coping skills for stressors. Pt identifies stressors and  allowed pt to explore and express thoughts and feelings associated with recent life situations and external stressors.[name] and pt reviewed his continued stressors individually: marital  issues, son's mental health, physical health, future, pt's sexual health after prostate cancer, lack of marital communication.  Pt's stressors have continued for a long period of time. Clinician provided thought stopping tools, as well as reality testing. Clinician utilized CBT to address thought processes support and confidence in his decisions.  Clinician explored emotional state      Assessment and plan: Counselor will continue to meet with patient to address treatment plan goals. Patient will continue to follow recommendations of providers and imnd implement skills learned in session, and practice between sessions. Diagnosis: Bipolar 1 disorder.    Collaboration of care: Other:   Continue working with providers    Patient/Guardian was advised Release of Information must be obtained prior to any record release in order to collaborate their care with an outside provider. Patient/Guardian was advised if they have not already done so to contact the registration department to sign all necessary forms in order for [place] to release information regarding their care.    Consent: Patient/Guardian gives verbal consent for treatment and assignment of benefits for services provided during this visit. Patient/Guardian expressed understanding and agreed to proceed.     Follow Up Instructions:  I discussed the assessment and treatment plan with the patient. The patient was provided an opportunity to ask questions and all were answered. The patient agreed with the plan and demonstrated an understanding of the instructions.   The patient was advised to call back or seek an in-person evaluation if the symptoms worsen or if the condition fails to improve as anticipated.  I provided 60 minutes of non-face-to-face time during this encounter.   Nialah Saravia S, LCAS  07/16/23

## 2023-07-30 ENCOUNTER — Ambulatory Visit (HOSPITAL_COMMUNITY): Payer: BLUE CROSS/BLUE SHIELD | Admitting: Licensed Clinical Social Worker

## 2023-07-30 ENCOUNTER — Telehealth: Payer: Self-pay | Admitting: Hematology & Oncology

## 2023-07-30 DIAGNOSIS — F319 Bipolar disorder, unspecified: Secondary | ICD-10-CM

## 2023-07-30 NOTE — Telephone Encounter (Signed)
Called to schedule follow up. LVM for pt to return call for scheduling.

## 2023-07-31 DIAGNOSIS — M47816 Spondylosis without myelopathy or radiculopathy, lumbar region: Secondary | ICD-10-CM | POA: Diagnosis not present

## 2023-08-01 ENCOUNTER — Ambulatory Visit (INDEPENDENT_AMBULATORY_CARE_PROVIDER_SITE_OTHER): Payer: BLUE CROSS/BLUE SHIELD | Admitting: Internal Medicine

## 2023-08-01 ENCOUNTER — Encounter: Payer: Self-pay | Admitting: Internal Medicine

## 2023-08-01 VITALS — BP 120/96 | HR 90 | Temp 98.2°F | Ht 72.0 in | Wt 183.0 lb

## 2023-08-01 DIAGNOSIS — L03116 Cellulitis of left lower limb: Secondary | ICD-10-CM | POA: Diagnosis not present

## 2023-08-01 DIAGNOSIS — G8929 Other chronic pain: Secondary | ICD-10-CM

## 2023-08-01 DIAGNOSIS — M25572 Pain in left ankle and joints of left foot: Secondary | ICD-10-CM

## 2023-08-01 MED ORDER — DOXYCYCLINE HYCLATE 100 MG PO TABS: 100.0000 mg | ORAL_TABLET | Freq: Two times a day (BID) | ORAL | 0 refills | Status: DC

## 2023-08-01 NOTE — Assessment & Plan Note (Signed)
Recent episode of cellulitis sept 2024 which may be recurring. He has some redness with mild swelling mid foot and ankle left leg. Rx doxycycline 1 week and follow up if not improving.

## 2023-08-01 NOTE — Progress Notes (Signed)
   Subjective:   Patient ID: Alex [name], male    DOB: Mar 11, 1956, 67 y.o.   MRN: [id number]  HPI The patient is a 67 YO man coming in for possible left lower leg/ankle cellulitis. Had episode in August/sept and this feels similar. Is swelling and some redness.   Review of Systems  Constitutional: Negative.   HENT: Negative.    Eyes: Negative.   Respiratory:  Negative for cough, chest tightness and shortness of breath.   Cardiovascular:  Positive for leg swelling. Negative for chest pain and palpitations.  Gastrointestinal:  Negative for abdominal distention, abdominal pain, constipation, diarrhea, nausea and vomiting.  Musculoskeletal:  Positive for arthralgias and back pain.  Skin:  Positive for color change and rash.  Neurological: Negative.   Psychiatric/Behavioral: Negative.      Objective:  Physical Exam Constitutional:      Appearance: He is well-developed.  HENT:     Head: Normocephalic and atraumatic.  Cardiovascular:     Rate and Rhythm: Normal rate and regular rhythm.  Pulmonary:     Effort: Pulmonary effort is normal. No respiratory distress.     Breath sounds: Normal breath sounds. No wheezing or rales.  Abdominal:     General: Bowel sounds are normal. There is no distension.     Palpations: Abdomen is soft.     Tenderness: There is no abdominal tenderness. There is no rebound.  Musculoskeletal:        General: Tenderness present.     Cervical back: Normal range of motion.     Comments: Bilateral edema 1+ to knees bilaterally  Skin:    General: Skin is warm and dry.     Comments: Foot exam done, redness midfoot and ankle with swelling  Neurological:     Mental Status: He is alert and oriented to person, place, and time.     Coordination: Coordination normal.     Vitals:   08/01/23 1116  BP: (!) 120/96  Pulse: 90  Temp: 98.2 F (36.8 C)  TempSrc: Oral  SpO2: 96%  Weight: 183 lb (83 kg)  Height: 6' (1.829 m)    Assessment & Plan:

## 2023-08-01 NOTE — Assessment & Plan Note (Signed)
Seems chronic and with more swelling concern for cellulitis today. Use tylenol for pain.

## 2023-08-01 NOTE — Patient Instructions (Addendum)
We have sent in doxycycline to take 1 pill twice a day for 1 week.  Let us know if not improving.

## 2023-08-05 ENCOUNTER — Encounter: Payer: Self-pay | Admitting: Internal Medicine

## 2023-08-05 NOTE — Telephone Encounter (Signed)
 Care team updated and letter sent for eye exam notes.

## 2023-08-10 ENCOUNTER — Encounter (HOSPITAL_COMMUNITY): Payer: Self-pay | Admitting: Licensed Clinical Social Worker

## 2023-08-10 NOTE — Progress Notes (Unsigned)
 Virtual virtual Video Note  I connected with Alex [name] on 07/16/2023 at 3-4pm EST by video-enabled virtual visit. I verified that I am speaking with the correct person using  two identifiers.I discussed the limitations of evaluation and management by telemedicine and the availability of in person appointments. The patient expressed understanding and agreed to proceed.    LOCATION: Patient: Home  Provider: Home office   History of Present Illness:  Pt was referred by Dr. Lolly Mustache for OP therapy for bipolar disorder and anxiety.  Treatment Goal Addressed:  Pt will meet with clinician 1x every 2 weeks for therapy to monitor for progress towards goals and address any barriers to success; Reduce depression from average severity level of 6/10 down to a 4/10 in next 90 days by engaging in 1-2 positive coping skills daily as part of developing self-care routine; Reduce average anxiety level from 7/10 down to 5/10 in next 90 days by utilizing 1-2 relaxation skills/grounding skills per day, such as mindful breathing, progressive muscle relaxation, positive visualizations.  Progress towards Treatment Goal: Progressing   Observations/Objective: Patient presented for today's session on time and was alert, oriented x5, with no evidence or self-report of SI/HI or A/V H.  Patient reported ongoing compliance with medication and denied any use of alcohol or illicit substances.  Clinician inquired about patient's current emotional ratings, as well as any significant changes in thoughts, feelings or behavior since previous session. Patient reported scores of  5/10 for depression, 5/10 for anxiety, 3/10 for anger/irritability. Pt explored his emotional ratings, and discussed coping skills for stressors. Pt identifies stressors and  allowed pt to explore and express thoughts and feelings associated with recent life situations and external stressors.[name] and pt reviewed his continued stressors individually: marital  issues, son's mental health, physical health, future, pt's sexual health after prostate cancer, lack of marital communication.  Pt's stressors have continued for a long period of time. Clinician provided thought stopping tools, as well as reality testing. Clinician utilized CBT to address thought processes support and confidence in his decisions.  Clinician explored emotional state      Assessment and plan: Counselor will continue to meet with patient to address treatment plan goals. Patient will continue to follow recommendations of providers and imnd implement skills learned in session, and practice between sessions. Diagnosis: Bipolar 1 disorder.    Collaboration of care: Other:   Continue working with providers    Patient/Guardian was advised Release of Information must be obtained prior to any record release in order to collaborate their care with an outside provider. Patient/Guardian was advised if they have not already done so to contact the registration department to sign all necessary forms in order for [place] to release information regarding their care.    Consent: Patient/Guardian gives verbal consent for treatment and assignment of benefits for services provided during this visit. Patient/Guardian expressed understanding and agreed to proceed.     Follow Up Instructions:  I discussed the assessment and treatment plan with the patient. The patient was provided an opportunity to ask questions and all were answered. The patient agreed with the plan and demonstrated an understanding of the instructions.   The patient was advised to call back or seek an in-person evaluation if the symptoms worsen or if the condition fails to improve as anticipated.  I provided 60 minutes of non-face-to-face time during this encounter.   Nialah Saravia S, LCAS  07/16/23

## 2023-08-11 ENCOUNTER — Ambulatory Visit: Payer: BLUE CROSS/BLUE SHIELD | Admitting: Internal Medicine

## 2023-08-11 ENCOUNTER — Encounter (HOSPITAL_COMMUNITY): Payer: Self-pay | Admitting: Licensed Clinical Social Worker

## 2023-08-15 ENCOUNTER — Encounter: Payer: Self-pay | Admitting: Hematology & Oncology

## 2023-08-18 ENCOUNTER — Encounter (HOSPITAL_COMMUNITY): Payer: Self-pay | Admitting: Licensed Clinical Social Worker

## 2023-08-18 DIAGNOSIS — H3589 Other specified retinal disorders: Secondary | ICD-10-CM | POA: Diagnosis not present

## 2023-08-18 DIAGNOSIS — T66XXXS Radiation sickness, unspecified, sequela: Secondary | ICD-10-CM | POA: Diagnosis not present

## 2023-08-19 ENCOUNTER — Ambulatory Visit: Payer: Self-pay | Admitting: Internal Medicine

## 2023-08-19 DIAGNOSIS — D803 Selective deficiency of immunoglobulin G [IgG] subclasses: Secondary | ICD-10-CM | POA: Diagnosis not present

## 2023-08-19 DIAGNOSIS — R739 Hyperglycemia, unspecified: Secondary | ICD-10-CM | POA: Diagnosis not present

## 2023-08-19 DIAGNOSIS — M47816 Spondylosis without myelopathy or radiculopathy, lumbar region: Secondary | ICD-10-CM | POA: Diagnosis not present

## 2023-08-19 DIAGNOSIS — D649 Anemia, unspecified: Secondary | ICD-10-CM | POA: Diagnosis not present

## 2023-08-19 DIAGNOSIS — Z85850 Personal history of malignant neoplasm of thyroid: Secondary | ICD-10-CM | POA: Diagnosis not present

## 2023-08-19 DIAGNOSIS — Z8589 Personal history of malignant neoplasm of other organs and systems: Secondary | ICD-10-CM | POA: Diagnosis not present

## 2023-08-19 NOTE — Telephone Encounter (Signed)
  Chief Complaint: Abscess R neck Symptoms: pain Frequency: 2 weeks Pertinent Negatives: Patient denies streaking, fever, drainage Disposition: [] ED /[] Urgent Care (no appt availability in office) / [x] Appointment(In office/virtual)/ []  [facility]/ [] Home Care/ [] Refused Recommended Disposition /[] [facility]/ []  Follow-up with PCP Additional Notes: Patient calls stating he has a boil on the R side of his neck where the collar of his shirt sits. Reports it is approx the size of a dime, raised, red, hard, slightly warm to touch. States he has been using warm compresses with no relief. Denies streaking, fever, drainage from area. Per protocol, patient to be evaluated within 3 days, scheduled with alternate provider in office for 08/20/23 @ 1020. Care advice reviewed, patient verbalized understanding. Alerting PCP for review.    Copied from CRM [id number]. Topic: Clinical - Red Word Triage >> Aug 19, 2023  8:36 AM Robinson H wrote: [facility] that prompted transfer to Nurse Triage: Boil on back of neck, pain in neck and when he turns his head. Reason for Disposition  Boil > 1/2 inch across (> 12 mm; larger than a marble)  Answer Assessment - Initial Assessment Questions 1. APPEARANCE of BOIL: What does the boil look like?      Raised approx size of dime, red, warm to touch, no streaking 2. LOCATION: Where is the boil located?      Neck, where the collar of the shirt right side 3. NUMBER: How many boils are there?      One 4. SIZE: How big is the boil? (e.g., inches, cm; compare to size of a coin or other object)     Approx size of dime 5. ONSET: When did the boil start?     Approx 2 weeks ago 6. PAIN: Is there any pain? If Yes, ask: How bad is the pain?   (Scale 1-10; or mild, moderate, severe)     4/10, constant worse with movement 7. FEVER: Do you have a fever? If Yes, ask: What is it, how was it measured, and when did it start?      Denies 8. SOURCE:  Have you been around anyone with boils or other Staph infections? Have you ever had boils before?     Denies 9. OTHER SYMPTOMS: Do you have any other symptoms? (e.g., shaking chills, weakness, rash elsewhere on body)     Denies  Protocols used: Boil (Skin Abscess)-A-AH

## 2023-08-20 ENCOUNTER — Ambulatory Visit: Payer: BLUE CROSS/BLUE SHIELD | Admitting: Nurse Practitioner

## 2023-08-20 ENCOUNTER — Ambulatory Visit (INDEPENDENT_AMBULATORY_CARE_PROVIDER_SITE_OTHER): Payer: BLUE CROSS/BLUE SHIELD | Admitting: Licensed Clinical Social Worker

## 2023-08-20 VITALS — BP 116/74 | HR 79 | Temp 97.9°F | Ht 72.0 in | Wt 183.0 lb

## 2023-08-20 DIAGNOSIS — L0291 Cutaneous abscess, unspecified: Secondary | ICD-10-CM | POA: Diagnosis not present

## 2023-08-20 DIAGNOSIS — F319 Bipolar disorder, unspecified: Secondary | ICD-10-CM | POA: Diagnosis not present

## 2023-08-20 DIAGNOSIS — R300 Dysuria: Secondary | ICD-10-CM | POA: Diagnosis not present

## 2023-08-20 LAB — CBC WITH DIFFERENTIAL/PLATELET
Basophils Absolute: 0.1 10*3/uL (ref 0.0–0.1)
Basophils Relative: 0.8 % (ref 0.0–3.0)
Eosinophils Absolute: 0.4 10*3/uL (ref 0.0–0.7)
Eosinophils Relative: 3.9 % (ref 0.0–5.0)
HCT: 43.8 % (ref 39.0–52.0)
Hemoglobin: 14.5 g/dL (ref 13.0–17.0)
Lymphocytes Relative: 12.4 % (ref 12.0–46.0)
Lymphs Abs: 1.2 10*3/uL (ref 0.7–4.0)
MCHC: 33.1 g/dL (ref 30.0–36.0)
MCV: 83 fL (ref 78.0–100.0)
Monocytes Absolute: 1.4 10*3/uL — ABNORMAL HIGH (ref 0.1–1.0)
Monocytes Relative: 14.3 % — ABNORMAL HIGH (ref 3.0–12.0)
Neutro Abs: 6.8 10*3/uL (ref 1.4–7.7)
Neutrophils Relative %: 68.6 % (ref 43.0–77.0)
Platelets: 275 10*3/uL (ref 150.0–400.0)
RBC: 5.28 Mil/uL (ref 4.22–5.81)
RDW: 15.4 % (ref 11.5–15.5)
WBC: 10 10*3/uL (ref 4.0–10.5)

## 2023-08-20 LAB — BASIC METABOLIC PANEL
BUN: 22 mg/dL (ref 6–23)
CO2: 29 meq/L (ref 19–32)
Calcium: 8.8 mg/dL (ref 8.4–10.5)
Chloride: 101 meq/L (ref 96–112)
Creatinine, Ser: 1.02 mg/dL (ref 0.40–1.50)
GFR: 74.88 mL/min (ref >60.00)
Glucose, Bld: 91 mg/dL (ref 70–99)
Potassium: 4.2 meq/L (ref 3.5–5.1)
Sodium: 136 meq/L (ref 135–145)

## 2023-08-20 MED ORDER — SULFAMETHOXAZOLE-TRIMETHOPRIM 800-160 MG PO TABS: 1.0000 | ORAL_TABLET | Freq: Two times a day (BID) | ORAL | 0 refills | Status: DC

## 2023-08-20 NOTE — Assessment & Plan Note (Signed)
 Acute Patient reports possible recent kidney stone passage.  Will order urine culture to test for UTI.  Further recommendations may be made based upon the results.

## 2023-08-20 NOTE — Progress Notes (Signed)
 Established Patient Office Visit  Subjective   Patient ID: Alex [name], male    DOB: [birth year]  Age: 68 y.o. MRN: [id number]  Chief Complaint  Patient presents with   Abscess    Abscess on the right side if the neck area ( close to the back) Been there for about three weeks and has gotten bigger      Patient arrives today for 3-week history of lesion on the back of his neck that is swollen and painful.  He is concerned that it is an abscess.  He reports that he is immunocompromise at baseline.  Denies any fever or chills.  Area has not been draining. Past medical history is significant for anemia, anxiety, arthritis, basal cell carcinoma, squamous cell carcinoma, orbital cancer, and thyroid  cancer, depression, GERD, hyperlipidemia, hypertension, CAD.  He also reports that he passed a kidney stone last night and has been having dysuria today.    ROS: see HPI    Objective:     BP 116/74   Pulse 79   Temp 97.9 F (36.6 C) (Temporal)   Ht 6' (1.829 m)   Wt 183 lb (83 kg)   SpO2 97%   BMI 24.82 kg/m    Physical Exam Vitals reviewed.  Constitutional:      Appearance: Normal appearance.  HENT:     Head: Normocephalic and atraumatic.  Cardiovascular:     Rate and Rhythm: Normal rate and regular rhythm.  Pulmonary:     Effort: Pulmonary effort is normal.     Breath sounds: Normal breath sounds.  Musculoskeletal:     Cervical back: Neck supple.  Skin:    General: Skin is warm and dry.       Neurological:     Mental Status: He is alert and oriented to person, place, and time.  Psychiatric:        Mood and Affect: Mood normal.        Behavior: Behavior normal.        Thought Content: Thought content normal.        Judgment: Judgment normal.      No results found for any visits on 08/20/23.    The 10-year ASCVD risk score (Arnett DK, et al., 2019) is: 23.1%    Assessment & Plan:   Problem List Items Addressed This Visit       Other   Dysuria    Acute Patient reports possible recent kidney stone passage.  Will order urine culture to test for UTI.  Further recommendations may be made based upon the results.      Relevant Orders   Urine Culture   Abscess - Primary   Area back does look like an infected abscess.  Does not seem large enough to warrant I&D today, will treat with Bactrim  x 10 days and warm compresses 3 times daily.  Will refer to general surgery so patient can be consulted for possible I&D if symptoms worsen or do not improve despite being on Bactrim .  Patient was advised that he can cancel surgery appointment if symptoms start to improve.  He is also scheduled for routine follow-up with PCP next week, he was encouraged to follow-up as scheduled so lesion could be monitored closely.  He reports taking a daily probiotic which she plans to continue doing so.  He was encouraged to reach out if he experiences excessive diarrhea.  He reports his understanding.      Relevant Medications   sulfamethoxazole -trimethoprim  (BACTRIM  DS)  800-160 MG tablet   Other Relevant Orders   Ambulatory referral to General Surgery   Basic metabolic panel   CBC with Differential/Platelet    Return for as scheduled.    Lauraine FORBES Pereyra, NP

## 2023-08-20 NOTE — Assessment & Plan Note (Addendum)
 Area back does look like an infected abscess.  Does not seem large enough to warrant I&D today, will treat with Bactrim  x 10 days and warm compresses 3 times daily.  Will refer to general surgery so patient can be consulted for possible I&D if symptoms worsen or do not improve despite being on Bactrim .  Patient was advised that he can cancel surgery appointment if symptoms start to improve.  He is also scheduled for routine follow-up with PCP next week, he was encouraged to follow-up as scheduled so lesion could be monitored closely.  He reports taking a daily probiotic which she plans to continue doing so.  He was encouraged to reach out if he experiences excessive diarrhea.  He reports his understanding.

## 2023-08-21 LAB — LAB REPORT - SCANNED: EGFR: 93

## 2023-08-23 LAB — URINE CULTURE

## 2023-08-25 ENCOUNTER — Telehealth (HOSPITAL_COMMUNITY): Payer: Self-pay | Admitting: *Deleted

## 2023-08-25 ENCOUNTER — Other Ambulatory Visit (HOSPITAL_COMMUNITY): Payer: Self-pay | Admitting: Psychiatry

## 2023-08-25 DIAGNOSIS — F411 Generalized anxiety disorder: Secondary | ICD-10-CM

## 2023-08-25 MED ORDER — ZOLPIDEM TARTRATE 10 MG PO TABS: ORAL_TABLET | ORAL | 0 refills | Status: DC

## 2023-08-25 NOTE — Telephone Encounter (Signed)
 Pt called requesting a refill of Ambien 10 mg last e-scribed on 06/2023 during last visit. Pt takes half to whole tablet at bedtime. Pt f/u is scheduled for 10/02/23. Please review.

## 2023-08-25 NOTE — Telephone Encounter (Signed)
 Done

## 2023-08-26 ENCOUNTER — Encounter: Payer: Self-pay | Admitting: Family

## 2023-08-26 ENCOUNTER — Ambulatory Visit (INDEPENDENT_AMBULATORY_CARE_PROVIDER_SITE_OTHER): Payer: BLUE CROSS/BLUE SHIELD | Admitting: Licensed Clinical Social Worker

## 2023-08-26 ENCOUNTER — Ambulatory Visit: Payer: BLUE CROSS/BLUE SHIELD | Admitting: Internal Medicine

## 2023-08-26 ENCOUNTER — Encounter: Payer: Self-pay | Admitting: Internal Medicine

## 2023-08-26 VITALS — BP 136/86 | HR 94 | Temp 97.8°F | Ht 72.0 in | Wt 180.0 lb

## 2023-08-26 DIAGNOSIS — Z0000 Encounter for general adult medical examination without abnormal findings: Secondary | ICD-10-CM | POA: Diagnosis not present

## 2023-08-26 DIAGNOSIS — D839 Common variable immunodeficiency, unspecified: Secondary | ICD-10-CM

## 2023-08-26 DIAGNOSIS — R600 Localized edema: Secondary | ICD-10-CM | POA: Diagnosis not present

## 2023-08-26 DIAGNOSIS — F419 Anxiety disorder, unspecified: Secondary | ICD-10-CM

## 2023-08-26 DIAGNOSIS — L0292 Furuncle, unspecified: Secondary | ICD-10-CM | POA: Insufficient documentation

## 2023-08-26 DIAGNOSIS — E039 Hypothyroidism, unspecified: Secondary | ICD-10-CM | POA: Diagnosis not present

## 2023-08-26 DIAGNOSIS — F319 Bipolar disorder, unspecified: Secondary | ICD-10-CM | POA: Diagnosis not present

## 2023-08-26 LAB — CBC WITH DIFFERENTIAL/PLATELET
Basophils Absolute: 0.1 10*3/uL (ref 0.0–0.1)
Basophils Relative: 1 % (ref 0.0–3.0)
Eosinophils Absolute: 0.2 10*3/uL (ref 0.0–0.7)
Eosinophils Relative: 2.8 % (ref 0.0–5.0)
HCT: 44.6 % (ref 39.0–52.0)
Hemoglobin: 14.4 g/dL (ref 13.0–17.0)
Lymphocytes Relative: 18.8 % (ref 12.0–46.0)
Lymphs Abs: 1.5 10*3/uL (ref 0.7–4.0)
MCHC: 32.2 g/dL (ref 30.0–36.0)
MCV: 83.4 fL (ref 78.0–100.0)
Monocytes Absolute: 1 10*3/uL (ref 0.1–1.0)
Monocytes Relative: 12.4 % — ABNORMAL HIGH (ref 3.0–12.0)
Neutro Abs: 5.3 10*3/uL (ref 1.4–7.7)
Neutrophils Relative %: 65 % (ref 43.0–77.0)
Platelets: 293 10*3/uL (ref 150.0–400.0)
RBC: 5.35 Mil/uL (ref 4.22–5.81)
RDW: 15.5 % (ref 11.5–15.5)
WBC: 8.1 10*3/uL (ref 4.0–10.5)

## 2023-08-26 LAB — PSA: PSA: 3.52 ng/mL (ref 0.10–4.00)

## 2023-08-26 LAB — TSH: TSH: 0.7 u[IU]/mL (ref 0.35–5.50)

## 2023-08-26 LAB — T4, FREE: Free T4: 1.11 ng/dL (ref 0.60–1.60)

## 2023-08-26 NOTE — Assessment & Plan Note (Signed)
 New boil on the R post neck - on Bactrim DS Dressed w/abx oint and bandaid Dr Gerrit Friends appt tomorrow

## 2023-08-26 NOTE — Assessment & Plan Note (Addendum)
 Post-op - Dr Gerrit Friends On Levothyroxine Check TSH, FT4

## 2023-08-26 NOTE — Progress Notes (Signed)
 Subjective:  Patient ID: Alex [name], male    DOB: 1955-11-24  Age: 68 y.o. MRN: [id number]  CC: Medical Management of Chronic Issues (Reevaluate boil)   HPI Alex [name] presents for stress F/u HTN, stress w/Justin C/o boil on the neck - on Bactrim  DS   Outpatient Medications Prior to Visit  Medication Sig Dispense Refill   acetaminophen  (TYLENOL ) 500 MG tablet Take by mouth.     albuterol  (VENTOLIN  HFA) 108 (90 Base) MCG/ACT inhaler TAKE 2 PUFFS BY MOUTH EVERY 6 HOURS AS NEEDED FOR WHEEZE OR SHORTNESS OF BREATH 6.7 each 5   amLODipine  (NORVASC ) 5 MG tablet Take 1 tablet (5 mg total) by mouth daily. 90 tablet 3   aspirin  EC 81 MG tablet Take 1 tablet by mouth daily.     atorvastatin  (LIPITOR) 40 MG tablet TAKE 1 TABLET BY MOUTH EVERY DAY 90 tablet 1   AXIRON  30 MG/ACT SOLN Place 2 Act onto the skin daily.     brimonidine (ALPHAGAN) 0.2 % ophthalmic solution SMARTSIG:In Eye(s)     busPIRone  (BUSPAR ) 5 MG tablet TAKE 1 TABLET BY MOUTH EVERYDAY AT BEDTIME 30 tablet 1   Coenzyme Q10 (CO Q 10 PO) Take 1 capsule by mouth daily.      EPINEPHrine  0.3 mg/0.3 mL IJ SOAJ injection Inject 0.3 mg into the muscle as needed.     erythromycin ophthalmic ointment Place into the right eye at bedtime.     furosemide  (LASIX ) 20 MG tablet TAKE 1 TO 2 TABLETS BY MOUTH DAILY AS NEEDED 180 tablet 1   Immune Globulin, Human, 4 GM/20ML SOLN Inject into the skin once a week. wednesday     LamoTRIgine  300 MG TB24 24 hour tablet Take 1 tablet (300 mg total) by mouth every morning. 90 tablet 0   latanoprost  (XALATAN ) 0.005 % ophthalmic solution 1 DROP EACH EYE AT NIGHT     levothyroxine  (SYNTHROID ) 150 MCG tablet TAKE 1 TABLET BY MOUTH DAILY BEFORE BREAKFAST. 90 tablet 2   LORazepam  (ATIVAN ) 0.5 MG tablet Take one tab daily and 2nd if needed for anxiety 45 tablet 0   meloxicam  (MOBIC ) 15 MG tablet TAKE 1 TABLET BY MOUTH EVERY DAY AS NEEDED 90 tablet 0   Multiple Vitamin (MULTI-VITAMINS) TABS Take by  mouth.     OMEGA-3 KRILL OIL PO Take 1 capsule by mouth daily.      omeprazole (PRILOSEC) 20 MG capsule Take 20 mg by mouth every evening.     sildenafil  (REVATIO ) 20 MG tablet Take 1 tablet (20 mg total) by mouth as needed. 30 tablet 1   sulfamethoxazole -trimethoprim  (BACTRIM  DS) 800-160 MG tablet Take 1 tablet by mouth 2 (two) times daily. 20 tablet 0   tadalafil  (CIALIS ) 5 MG tablet Take 5 mg by mouth.     tamsulosin  (FLOMAX ) 0.4 MG CAPS capsule TAKE 1 CAPSULE BY MOUTH EVERY DAY (Patient taking differently: as needed.) 90 capsule 1   telmisartan  (MICARDIS ) 80 MG tablet Take 1 tablet (80 mg total) by mouth daily. 90 tablet 1   timolol  (TIMOPTIC ) 0.5 % ophthalmic solution timolol  maleate 0.5 % eye drops  INSTILL 1 DROP INTO RIGHT EYE TWICE A DAY     traMADol  (ULTRAM ) 50 MG tablet Take 50 mg by mouth as needed.     triamcinolone  ointment (KENALOG ) 0.5 % Apply 1 Application topically 4 (four) times daily. On the lip 30 g 0   zolpidem  (AMBIEN ) 10 MG tablet TAKE 1/2 (ONE-HALF) TABLET BY MOUTH AT BEDTIME  AND  TAKE  THE  OTHER  HALF  IF  NEEDED 30 tablet 0   No facility-administered medications prior to visit.    ROS: Review of Systems  Constitutional:  Positive for fatigue. Negative for appetite change and unexpected weight change.  HENT:  Negative for congestion, nosebleeds, sneezing, sore throat and trouble swallowing.   Eyes:  Negative for itching and visual disturbance.  Respiratory:  Negative for cough.   Cardiovascular:  Negative for chest pain, palpitations and leg swelling.  Gastrointestinal:  Negative for abdominal distention, blood in stool, diarrhea and nausea.  Genitourinary:  Negative for frequency and hematuria.  Musculoskeletal:  Positive for arthralgias and back pain. Negative for gait problem, joint swelling and neck pain.  Skin:  Positive for color change and wound. Negative for rash.  Neurological:  Negative for dizziness, tremors, speech difficulty and weakness.   Psychiatric/Behavioral:  Positive for dysphoric mood. Negative for agitation, sleep disturbance and suicidal ideas. The patient is nervous/anxious.     Objective:  BP 136/86 (BP Location: Left Arm, Patient Position: Sitting, Cuff Size: Large)   Pulse 94   Temp 97.8 F (36.6 C) (Temporal)   Ht 6' (1.829 m)   Wt 180 lb (81.6 kg)   SpO2 98%   BMI 24.41 kg/m   BP Readings from Last 3 Encounters:  08/26/23 136/86  08/20/23 116/74  08/01/23 (!) 120/96    Wt Readings from Last 3 Encounters:  08/26/23 180 lb (81.6 kg)  08/20/23 183 lb (83 kg)  08/01/23 183 lb (83 kg)    Physical Exam Constitutional:      General: He is not in acute distress.    Appearance: Normal appearance. He is well-developed.     Comments: NAD  Eyes:     Conjunctiva/sclera: Conjunctivae normal.     Pupils: Pupils are equal, round, and reactive to light.  Neck:     Thyroid : No thyromegaly.     Vascular: No JVD.  Cardiovascular:     Rate and Rhythm: Normal rate and regular rhythm.     Heart sounds: Normal heart sounds. No murmur heard.    No friction rub. No gallop.  Pulmonary:     Effort: Pulmonary effort is normal. No respiratory distress.     Breath sounds: Normal breath sounds. No wheezing or rales.  Chest:     Chest wall: No tenderness.  Abdominal:     General: Bowel sounds are normal. There is no distension.     Palpations: Abdomen is soft. There is no mass.     Tenderness: There is no abdominal tenderness. There is no guarding or rebound.  Musculoskeletal:        General: Tenderness present. Normal range of motion.     Cervical back: Normal range of motion.     Right lower leg: No edema.     Left lower leg: No edema.  Lymphadenopathy:     Cervical: No cervical adenopathy.  Skin:    General: Skin is warm and dry.     Findings: Erythema present. No rash.  Neurological:     Mental Status: He is alert and oriented to person, place, and time.     Cranial Nerves: No cranial nerve deficit.      Motor: No abnormal muscle tone.     Coordination: Coordination normal.     Gait: Gait normal.     Deep Tendon Reflexes: Reflexes are normal and symmetric.  Psychiatric:        Behavior: Behavior normal.  Thought Content: Thought content normal.        Judgment: Judgment normal.    1 cm boil on the R post neck  Lab Results  Component Value Date   WBC 10.0 08/20/2023   HGB 14.5 08/20/2023   HCT 43.8 08/20/2023   PLT 275.0 08/20/2023   GLUCOSE 91 08/20/2023   CHOL 183 05/12/2023   TRIG 139.0 05/12/2023   HDL 80.80 05/12/2023   LDLDIRECT 59.0 02/02/2016   LDLCALC 74 05/12/2023   ALT 31 05/12/2023   AST 30 05/12/2023   NA 136 08/20/2023   K 4.2 08/20/2023   CL 101 08/20/2023   CREATININE 1.02 08/20/2023   BUN 22 08/20/2023   CO2 29 08/20/2023   TSH 4.05 05/12/2023   PSA 5.19 (H) 06/14/2021   INR 1.0 09/13/2019   HGBA1C 5.5 09/29/2018    DG Tibia/Fibula Left Result Date: 04/11/2023 CLINICAL DATA:  Deep wound infection EXAM: LEFT TIBIA AND FIBULA - 2 VIEW COMPARISON:  None Available. FINDINGS: No fracture or dislocation. Preserved joint spaces and bone mineralization. Soft tissue swelling about the ankle. On the lateral view there is small lucency in the soft tissues anterior to the mid lower leg. This appears to be slightly lateral on the AP view. Please correlate for soft tissue abnormality. Overall if there is concern of bone infection, MRI or bone scan is recommended for further delineation. IMPRESSION: No acute osseous abnormality. Small soft tissue lucency along the anterolateral lower leg. Please correlate with exact location of abnormality. Electronically Signed   By: Ranell Bring [name]   On: 04/11/2023 12:42    Assessment & Plan:   Problem List Items Addressed This Visit     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HCC)   Chronic: on IV IgG       Relevant Orders   TSH   Urinalysis   CBC with Differential/Platelet   Lipid panel   PSA   Comprehensive metabolic  panel   T4, free   PSA   Anxiety disorder    stress w/Justin      Well adult exam - Primary    We discussed age appropriate health related issues, including available/recomended screening tests and vaccinations. Labs were ordered to be later reviewed . All questions were answered. We discussed one or more of the following - seat belt use, use of sunscreen/sun exposure exercise, fall risk reduction, second hand smoke exposure, firearm use and storage, seat belt use, a need for adhering to healthy diet and exercise. Labs were ordered.  All questions were answered. Multiple tests are being done regular due to other diagnoses       Relevant Orders   TSH   Urinalysis   CBC with Differential/Platelet   Lipid panel   PSA   Comprehensive metabolic panel   T4, free   PSA   Edema   Chronic venous insufficiency L>>R worse after a vacation Use Elastic socks knee high for support       Relevant Orders   TSH   Urinalysis   CBC with Differential/Platelet   Lipid panel   PSA   Comprehensive metabolic panel   T4, free   PSA   Rheumatoid factor   Uric acid   Hypothyroidism (acquired)   Post-op - Dr Eletha On Levothyroxine  Check TSH, FT4      Relevant Orders   TSH   Urinalysis   CBC with Differential/Platelet   Lipid panel   PSA   Comprehensive metabolic panel   T4, free  PSA   Boil   New boil on the R post neck - on Bactrim  DS Dressed w/abx oint and bandaid Dr Eletha appt tomorrow      Relevant Orders   CBC with Differential/Platelet      No orders of the defined types were placed in this encounter.     Follow-up: Return in about 3 months (around 11/24/2023) for a follow-up visit.  Marolyn Noel, MD

## 2023-08-26 NOTE — Assessment & Plan Note (Signed)
 stress w/Justin

## 2023-08-26 NOTE — Assessment & Plan Note (Signed)
 Chronic venous insufficiency L>>R worse after a vacation Use Elastic socks knee high for support

## 2023-08-26 NOTE — Assessment & Plan Note (Signed)
Chronic: on IV IgG

## 2023-08-26 NOTE — Assessment & Plan Note (Signed)
  We discussed age appropriate health related issues, including available/recomended screening tests and vaccinations. Labs were ordered to be later reviewed . All questions were answered. We discussed one or more of the following - seat belt use, use of sunscreen/sun exposure exercise, fall risk reduction, second hand smoke exposure, firearm use and storage, seat belt use, a need for adhering to healthy diet and exercise. Labs were ordered.  All questions were answered. Multiple tests are being done regular due to other diagnoses

## 2023-08-27 ENCOUNTER — Ambulatory Visit: Payer: Self-pay | Admitting: Surgery

## 2023-08-27 ENCOUNTER — Inpatient Hospital Stay: Payer: BLUE CROSS/BLUE SHIELD | Attending: Hematology & Oncology | Admitting: Hematology & Oncology

## 2023-08-27 VITALS — BP 133/88 | HR 86 | Temp 97.8°F | Resp 17 | Ht 72.0 in | Wt 180.0 lb

## 2023-08-27 DIAGNOSIS — Z85828 Personal history of other malignant neoplasm of skin: Secondary | ICD-10-CM | POA: Insufficient documentation

## 2023-08-27 DIAGNOSIS — M1990 Unspecified osteoarthritis, unspecified site: Secondary | ICD-10-CM | POA: Insufficient documentation

## 2023-08-27 DIAGNOSIS — R222 Localized swelling, mass and lump, trunk: Secondary | ICD-10-CM | POA: Insufficient documentation

## 2023-08-27 DIAGNOSIS — Z85850 Personal history of malignant neoplasm of thyroid: Secondary | ICD-10-CM | POA: Diagnosis not present

## 2023-08-27 DIAGNOSIS — L02413 Cutaneous abscess of right upper limb: Secondary | ICD-10-CM | POA: Diagnosis not present

## 2023-08-27 DIAGNOSIS — D839 Common variable immunodeficiency, unspecified: Secondary | ICD-10-CM | POA: Insufficient documentation

## 2023-08-27 DIAGNOSIS — D500 Iron deficiency anemia secondary to blood loss (chronic): Secondary | ICD-10-CM

## 2023-08-27 LAB — LIPID PANEL
Cholesterol: 186 mg/dL (ref 0–200)
HDL: 60.6 mg/dL (ref >39.00)
LDL Cholesterol: 72 mg/dL (ref 0–99)
NonHDL: 124.96
Total CHOL/HDL Ratio: 3
Triglycerides: 263 mg/dL — ABNORMAL HIGH (ref 0.0–149.0)
VLDL: 52.6 mg/dL — ABNORMAL HIGH (ref 0.0–40.0)

## 2023-08-27 LAB — URINALYSIS
Bilirubin Urine: NEGATIVE
Hgb urine dipstick: NEGATIVE
Ketones, ur: NEGATIVE
Leukocytes,Ua: NEGATIVE
Nitrite: NEGATIVE
Specific Gravity, Urine: >=1.03 — AB (ref 1.000–1.030)
Total Protein, Urine: NEGATIVE
Urine Glucose: NEGATIVE
Urobilinogen, UA: 0.2 (ref 0.0–1.0)
pH: 6 (ref 5.0–8.0)

## 2023-08-27 LAB — COMPREHENSIVE METABOLIC PANEL
ALT: 36 U/L (ref 0–53)
AST: 32 U/L (ref 0–37)
Albumin: 4.5 g/dL (ref 3.5–5.2)
Alkaline Phosphatase: 110 U/L (ref 39–117)
BUN: 22 mg/dL (ref 6–23)
CO2: 24 meq/L (ref 19–32)
Calcium: 8.9 mg/dL (ref 8.4–10.5)
Chloride: 102 meq/L (ref 96–112)
Creatinine, Ser: 1.07 mg/dL (ref 0.40–1.50)
GFR: 70.7 mL/min (ref >60.00)
Glucose, Bld: 92 mg/dL (ref 70–99)
Potassium: 4.3 meq/L (ref 3.5–5.1)
Sodium: 137 meq/L (ref 135–145)
Total Bilirubin: 0.5 mg/dL (ref 0.2–1.2)
Total Protein: 6.9 g/dL (ref 6.0–8.3)

## 2023-08-27 LAB — URIC ACID: Uric Acid, Serum: 3.1 mg/dL — ABNORMAL LOW (ref 4.0–7.8)

## 2023-08-27 LAB — RHEUMATOID FACTOR: Rheumatoid fact SerPl-aCnc: <10 [IU]/mL (ref <14)

## 2023-08-27 NOTE — Progress Notes (Signed)
 Hematology and Oncology Follow Up Visit  Alex [name] [id number] [birth year] 68 y.o. 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  Current Therapy:   IVIG 40 g IV monthly IV iron -Venofer  given on 11/28/2022     Interim History:  Alex [name] is back for follow-up.  We last saw him back in August.  Since then, has been doing okay.  He is bothered by arthritis.  Is going to see a specialist out at [facility].  Surprisingly, on the back of his neck sort of in the right trapezius area, he does have a nodule.  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  He is going to see the surgeon I think this week to have this removed.  Hopefully, it is not malignant.  Otherwise, he has had no problem with infections.  He does the IVIG at home.  His last IgG level was of a 974 mg/dL.  His last iron  studies that we did back in April showed a ferritin of 106 and iron  saturation of 25%.  His appetite has been pretty good.  He did have a nice Holiday season with his family.  He has had no fever.  Thankfully, there is been no problems with COVID.  Overall, I would say that his performance status is probably ECOG 1.   Medications:  Current Outpatient Medications:    acetaminophen  (TYLENOL ) 500 MG tablet, Take by mouth., Disp: , Rfl:    amLODipine  (NORVASC ) 5 MG tablet, Take 1 tablet (5 mg total) by mouth daily., Disp: 90 tablet, Rfl: 3   aspirin  EC 81 MG tablet, Take 1 tablet by mouth daily., Disp: , Rfl:    atorvastatin  (LIPITOR) 40 MG tablet, TAKE 1 TABLET BY MOUTH EVERY DAY, Disp: 90 tablet, Rfl: 1   AXIRON  30 MG/ACT SOLN, Place 2 Act onto the skin daily., Disp: , Rfl:    brimonidine (ALPHAGAN) 0.2 % ophthalmic solution, SMARTSIG:In Eye(s), Disp: , Rfl:    Coenzyme Q10 (CO Q 10 PO), Take 1 capsule by mouth daily. , Disp: , Rfl:    erythromycin ophthalmic ointment,  Place into the right eye at bedtime., Disp: , Rfl:    Immune Globulin, Human, 4 GM/20ML SOLN, Inject into the skin once a week. wednesday, Disp: , Rfl:    LamoTRIgine  300 MG TB24 24 hour tablet, Take 1 tablet (300 mg total) by mouth every morning., Disp: 90 tablet, Rfl: 0   latanoprost  (XALATAN ) 0.005 % ophthalmic solution, 1 DROP EACH EYE AT NIGHT, Disp: , Rfl:    levothyroxine  (SYNTHROID ) 150 MCG tablet, TAKE 1 TABLET BY MOUTH DAILY BEFORE BREAKFAST., Disp: 90 tablet, Rfl: 2   meloxicam  (MOBIC ) 15 MG tablet, TAKE 1 TABLET BY MOUTH EVERY DAY AS NEEDED, Disp: 90 tablet, Rfl: 0   Multiple Vitamin (MULTI-VITAMINS) TABS, Take by mouth., Disp: , Rfl:    OMEGA-3 KRILL OIL PO, Take 1 capsule by mouth daily. , Disp: , Rfl:    omeprazole (PRILOSEC) 20 MG capsule, Take 20 mg by mouth every evening., Disp: , Rfl:    sildenafil  (REVATIO ) 20 MG tablet, Take 1 tablet (20 mg total) by mouth as needed., Disp: 30 tablet, Rfl: 1   sulfamethoxazole -trimethoprim  (BACTRIM  DS) 800-160 MG tablet, Take 1 tablet by mouth 2 (two) times daily., Disp: 20 tablet, Rfl: 0   tadalafil  (CIALIS ) 5 MG tablet, Take 5 mg by mouth., Disp: ,  Rfl:    tamsulosin  (FLOMAX ) 0.4 MG CAPS capsule, TAKE 1 CAPSULE BY MOUTH EVERY DAY (Patient taking differently: as needed.), Disp: 90 capsule, Rfl: 1   telmisartan  (MICARDIS ) 80 MG tablet, Take 1 tablet (80 mg total) by mouth daily., Disp: 90 tablet, Rfl: 1   timolol  (TIMOPTIC ) 0.5 % ophthalmic solution, timolol  maleate 0.5 % eye drops  INSTILL 1 DROP INTO RIGHT EYE TWICE A DAY, Disp: , Rfl:    traMADol  (ULTRAM ) 50 MG tablet, Take 50 mg by mouth as needed., Disp: , Rfl:    triamcinolone  ointment (KENALOG ) 0.5 %, Apply 1 Application topically 4 (four) times daily. On the lip, Disp: 30 g, Rfl: 0   zolpidem  (AMBIEN ) 10 MG tablet, TAKE 1/2 (ONE-HALF) TABLET BY MOUTH AT BEDTIME AND  TAKE  THE  OTHER  HALF  IF  NEEDED, Disp: 30 tablet, Rfl: 0   albuterol  (VENTOLIN  HFA) 108 (90 Base) MCG/ACT inhaler, TAKE  2 PUFFS BY MOUTH EVERY 6 HOURS AS NEEDED FOR WHEEZE OR SHORTNESS OF BREATH (Patient not taking: Reported on 08/27/2023), Disp: 6.7 each, Rfl: 5   busPIRone  (BUSPAR ) 5 MG tablet, TAKE 1 TABLET BY MOUTH EVERYDAY AT BEDTIME (Patient not taking: Reported on 08/27/2023), Disp: 30 tablet, Rfl: 1   EPINEPHrine  0.3 mg/0.3 mL IJ SOAJ injection, Inject 0.3 mg into the muscle as needed. (Patient not taking: Reported on 08/27/2023), Disp: , Rfl:    furosemide  (LASIX ) 20 MG tablet, TAKE 1 TO 2 TABLETS BY MOUTH DAILY AS NEEDED (Patient not taking: Reported on 08/27/2023), Disp: 180 tablet, Rfl: 1   LORazepam  (ATIVAN ) 0.5 MG tablet, Take one tab daily and 2nd if needed for anxiety (Patient not taking: Reported on 08/27/2023), Disp: 45 tablet, Rfl: 0  Allergies:  No Known Allergies   Past Medical History, Surgical history, Social history, and Family History were reviewed and updated.  Review of Systems: Review of Systems  Constitutional: Negative.   HENT:  Negative.    Eyes: Negative.   Respiratory: Negative.    Cardiovascular: Negative.   Gastrointestinal: Negative.   Endocrine: Negative.   Genitourinary: Negative.    Musculoskeletal: Negative.   Skin: Negative.   Neurological: Negative.   Hematological: Negative.   Psychiatric/Behavioral: Negative.      Physical Exam:  height is 6' (1.829 m) and weight is 180 lb (81.6 kg). His oral temperature is 97.8 F (36.6 C). His blood pressure is 133/88 and his pulse is 86. His respiration is 17 and oxygen saturation is 100%.   Wt Readings from Last 3 Encounters:  08/27/23 180 lb (81.6 kg)  08/26/23 180 lb (81.6 kg)  08/20/23 183 lb (83 kg)    Physical Exam Vitals reviewed.  HENT:     Head: Normocephalic and atraumatic.  Eyes:     Pupils: Pupils are equal, round, and reactive to light.  Cardiovascular:     Rate and Rhythm: Normal rate and regular rhythm.     Heart sounds: Normal heart sounds.  Pulmonary:     Effort: Pulmonary effort is normal.      Breath sounds: Normal breath sounds.  Abdominal:     General: Bowel sounds are normal.     Palpations: Abdomen is soft.  Musculoskeletal:        General: No tenderness or deformity. Normal range of motion.     Cervical back: Normal range of motion.  Lymphadenopathy:     Cervical: No cervical adenopathy.  Skin:    General: Skin is warm and dry.  Findings: No erythema or rash.     Comments: In the right trapezius region, he has a nodule that probably measures about 7 x 7 mm.  It is red.  It is slightly firm but has slight fluctuance.  Neurological:     Mental Status: He is alert and oriented to person, place, and time.  Psychiatric:        Behavior: Behavior normal.        Thought Content: Thought content normal.        Judgment: Judgment normal.      Lab Results  Component Value Date   WBC 8.1 08/26/2023   HGB 14.4 08/26/2023   HCT 44.6 08/26/2023   MCV 83.4 08/26/2023   PLT 293.0 08/26/2023     Chemistry      Component Value Date/Time   NA 137 08/26/2023 1600   NA 142 03/12/2023 1200   NA 139 06/27/2017 0950   K 4.3 08/26/2023 1600   K 4.0 06/27/2017 0950   CL 102 08/26/2023 1600   CO2 24 08/26/2023 1600   CO2 23 06/27/2017 0950   BUN 22 08/26/2023 1600   BUN 19 03/12/2023 1200   BUN 19.2 06/27/2017 0950   CREATININE 1.07 08/26/2023 1600   CREATININE 1.04 03/31/2023 1442   CREATININE 1.1 06/27/2017 0950      Component Value Date/Time   CALCIUM  8.9 08/26/2023 1600   CALCIUM  8.8 06/27/2017 0950   ALKPHOS 110 08/26/2023 1600   ALKPHOS 74 06/27/2017 0950   AST 32 08/26/2023 1600   AST 20 03/31/2023 1442   AST 26 06/27/2017 0950   ALT 36 08/26/2023 1600   ALT 28 03/31/2023 1442   ALT 40 06/27/2017 0950   BILITOT 0.5 08/26/2023 1600   BILITOT 0.5 03/31/2023 1442   BILITOT 1.05 06/27/2017 0950      Impression and Plan: Mr. Chowning is a very nice 68 year old male.  He has multiple problems.  He has had a history of periorbital squamous cell carcinoma.   This was treated.  He had thyroid  cancer.  This also was treated and should not be a problem.  He will be incredibly interesting to see what this nodule ends up being.  Hopefully, it is maybe a little infection.  We will be interesting to see what happens that [facility] with the arthritis specialist.  Again, I do not see any obvious recurrence of malignancy although this nodule is somewhat concerning.  I really would like to see him back in a couple of months for follow-up.  If we find that this nodule is malignant, he will certainly need to have a full staging workup.   Ivor Mars, MD 1/15/20255:09 PM

## 2023-08-30 ENCOUNTER — Encounter (HOSPITAL_BASED_OUTPATIENT_CLINIC_OR_DEPARTMENT_OTHER): Payer: Self-pay | Admitting: Surgery

## 2023-08-30 NOTE — H&P (Signed)
REFERRING PHYSICIAN: Elenore Paddy, NP  PROVIDER: Arpan Eskelson Myra Rude, MD   Chief Complaint: New Consultation (Abscess right shoulder)  History of Present Illness:  Patient is referred by his primary care providers with an abscess of the skin on the right shoulder. This had been started on antibiotics about 1 week ago. It is had some improvement. There has been no drainage. Patient is now referred for surgical management. 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 He has had no previous such abscesses. He is currently taking Bactrim.  Review of Systems: A complete review of systems was obtained from the patient. I have reviewed this information and discussed as appropriate with the patient. See HPI as well for other ROS.  Review of Systems  Constitutional: Negative.  HENT: Negative.  Eyes: Negative.  Respiratory: Negative.  Cardiovascular: Negative.  Gastrointestinal: Negative.  Genitourinary: Negative.  Musculoskeletal: Positive for joint pain.  Skin:  Abscess right shoulder  Neurological: Negative.  Endo/Heme/Allergies: Negative.  Psychiatric/Behavioral: Negative.    Medical History: Past Medical History:  Diagnosis Date  Anesthesia complication  urinary retentions - with general anesthesia  Arthritis  Ascending aortic aneurysm (CMS-HCC)  Asthma without status asthmaticus (HHS-HCC)  Coronary artery disease  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CMS/HHS-HCC)  Glaucoma (increased eye pressure)  Hypertension  Hypothyroidism  Kidney stones  3 mos ago  Macular degeneration  Neuropathy  Prostate cancer (CMS/HHS-HCC)  Vision abnormalities   Patient Active Problem List  Diagnosis  Secondary glaucoma due to combination mechanisms, right, severe stage  Primary open angle glaucoma (POAG) of left eye, mild stage  Combined form of age-related cataract, left eye  Dry eye syndrome of both eyes  Radiation retinopathy, sequela  Cicatricial lagophthalmos of left  lower eyelid  Cicatricial lagophthalmos of right lower eyelid  Abnormal finding on imaging of liver  IPMN (intraductal papillary mucinous neoplasm)  Prostate cancer (CMS/HHS-HCC)  Hypogonadism male  Prostatitis, chronic  Benign prostatic hyperplasia with urinary obstruction  Retinal edema  Nonexudative age-related macular degeneration, left eye, early dry stage  Cystoid macular edema of right eye  Diabetes mellitus type 2 without retinopathy (CMS/HHS-HCC)  Superficial punctate keratitis of right eye  Cutaneous abscess of right shoulder   Past Surgical History:  Procedure Laterality Date  NEEDLE BIOPSY PROSTATE W/IMAGE GUIDANCE N/A 06/05/2022  Procedure: FUSION BIOPSY, TRANSPERINEAL, STEREOTACTIC TEMPLATE GUIDED SATURATION SAMPLING, INCLUDING IMAGING GUIDANCE; Surgeon: Geraldine Contras, MD; Location: [facility] OR; Service: Urology; Laterality: N/A; modifier 22  INSTILLATION/IRRIGATION/LAVAGE BLADDER N/A 06/05/2022  Procedure: BLADDER IRRIGATION, SIMPLE, LAVAGE AND/OR INSTILLATION; Surgeon: Geraldine Contras, MD; Location: [facility] OR; Service: Urology; Laterality: N/A; modifier 59  CRYOSURGERY PROSTATE N/A 07/09/2022  Procedure: CRYOSURGERY PROSTATE; Surgeon: Geraldine Contras, MD; Location: [facility] OR; Service: Urology; Laterality: N/A;  CYSTOURETHROSCOPY W/INSERTION URETHRAL STENT N/A 07/09/2022  Procedure: CYSTOURETHROSCOPY W/INSERTION URETHRAL STENT; Surgeon: Polascik, Latanya Maudlin, MD; Location: [facility] OR; Service: Urology; Laterality: N/A; modifier 59  INSTILLATION/IRRIGATION/LAVAGE BLADDER N/A 07/09/2022  Procedure: INSTILLATION/IRRIGATION/LAVAGE BLADDER; Surgeon: Geraldine Contras, MD; Location: [facility] OR; Service: Urology; Laterality: N/A; modifier 51  elbow surgery Bilateral  LENS EYE SURGERY Right  neck lymph node resection  THYROIDECTOMY TOTAL    No Known Allergies  Current Outpatient Medications on File Prior to Visit  Medication Sig  Dispense Refill  amLODIPine (NORVASC) 10 MG tablet Take 10 mg by mouth at bedtime  aspirin 81 MG EC tablet Take 81 mg by mouth every morning  atorvastatin (LIPITOR) 40 MG tablet Take 40 mg by mouth  at bedtime  brimonidine (ALPHAGAN) 0.2 % ophthalmic solution Place 1 drop into both eyes 2 (two) times daily  co-enzyme Q-10, ubiquinone, 30 mg capsule Take 1 capsule (30 mg total) by mouth once daily  DOCOSAHEXAENOIC ACID ORAL Take 1 tablet by mouth every morning  doxycycline (VIBRA-TABS) 100 MG tablet Take 100 mg by mouth 2 (two) times daily  EPINEPHrine (EPIPEN) 0.3 mg/0.3 mL auto-injector Inject 0.3 mg into the muscle once  immun glob G,IgG,/pro/IgA 0-50 (HIZENTRA SUBQ) Inject subcutaneously once a week Sundays - last dose 06/02/2022  lamoTRIgine 300 mg TR24 Take 300 mg by mouth every morning  latanoprost (XALATAN) 0.005 % ophthalmic solution INSTILL 1 DROP INTO BOTH EYES EVERY DAY AT NIGHT (Patient taking differently: Place 1 drop into both eyes at bedtime Every AM 1 drop right eye) 7.5 mL 4  levothyroxine (SYNTHROID, LEVOTHROID) 150 MCG tablet Take 150 mcg by mouth every morning  LORazepam (ATIVAN) 0.5 MG tablet Take 0.5 mg by mouth at bedtime  meloxicam (MOBIC) 15 MG tablet Take 1 tablet by mouth once daily as needed for Pain  methocarbamoL (ROBAXIN) 500 MG tablet Take 500 mg by mouth 2 (two) times daily as needed (Patient not taking: Reported on 04/18/2023)  multivitamin tablet Take 1 tablet by mouth once daily  omeprazole (PRILOSEC) 20 MG DR capsule Take 20 mg by mouth every morning  oxyBUTYnin (DITROPAN) 5 mg tablet Take 1 tablet (5 mg total) by mouth 3 (three) times daily as needed (bladder spasms) for up to 10 days 30 tablet 0  polyethylene glycol (MIRALAX) packet Take 1 packet (17 g total) by mouth once daily Mix in 4-8ounces of fluid prior to taking. 30 packet 0  sennosides-docusate (SENOKOT-S) 8.6-50 mg tablet Take 1 tablet by mouth 2 (two) times daily 30 tablet 0  tadalafiL (CIALIS) 20  MG tablet Take 1 tablet (20 mg total) by mouth once daily as needed for Erectile Dysfunction for up to 360 days 30 tablet 11  tamsulosin (FLOMAX) 0.4 mg capsule TAKE 1 CAPSULE BY MOUTH EVERY DAY (Patient not taking: Reported on 02/21/2023) 90 capsule 3  telmisartan-hydrochlorothiazide (MICARDIS HCT) 80-25 mg tablet Take 1 tablet by mouth every morning  testosterone 30 mg/actuation (1.5 mL) SlPm Place onto the skin once daily.  (Patient not taking: Reported on 04/18/2023)  timoloL maleate (TIMOPTIC) 0.5 % ophthalmic solution INSTILL 1 DROP INTO RIGHT EYE TWICE A DAY 10 mL 11  traMADoL (ULTRAM) 50 mg tablet Take 50 mg by mouth every 6 (six) hours as needed  vit C/E/Zn/coppr/lutein/zeaxan (PRESERVISION AREDS-2 ORAL) Take 1 tablet by mouth 2 (two) times daily  zolpidem (AMBIEN) 10 mg tablet Take 5 mg by mouth at bedtime   Current Facility-Administered Medications on File Prior to Visit  Medication Dose Route Frequency Provider Last Rate Last Admin  aflibercept (EYLEA HD) intravitreal injection 8 mg 8 mg RIGHT Eye Lad, Julien Girt, MD 8 mg at 10/31/22 1330  aflibercept (EYLEA HD) intravitreal injection 8 mg 8 mg RIGHT Eye Esperanza Heir, MD 8 mg at 12/12/22 1721  aflibercept (EYLEA HD) intravitreal injection 8 mg 8 mg RIGHT Eye Esperanza Heir, MD 8 mg at 03/13/23 1441  aflibercept (EYLEA HD) intravitreal injection 8 mg 8 mg RIGHT Eye Esperanza Heir, MD 8 mg at 04/28/23 1536  aflibercept (EYLEA HD) intravitreal injection 8 mg 8 mg RIGHT Eye Esperanza Heir, MD 8 mg at 07/07/23 1346   Family History  Problem Relation Age of Onset  High blood pressure (Hypertension) Mother  High blood pressure (Hypertension) Father  Macular degeneration Sister  Hyperthyroidism Sister  Anesthesia problems Neg Hx  Glaucoma Neg Hx    Social History   Tobacco Use  Smoking Status Never  Passive exposure: Past  Smokeless Tobacco Never    Social History   Socioeconomic History   Marital status: Married  Number of children: 7  Tobacco Use  Smoking status: Never  Passive exposure: Past  Smokeless tobacco: Never  Vaping Use  Vaping status: Never Used  Substance and Sexual Activity  Alcohol use: Not Currently  Drug use: Never   Social Drivers of Health   Financial Resource Strain: Low Risk (05/09/2023)  Received from [facility]  Overall Financial Resource Strain (CARDIA)  Difficulty of Paying Living Expenses: Not hard at all  Food Insecurity: No Food Insecurity (05/09/2023)  Received from [facility]  Hunger Vital Sign  Worried About Running Out of Food in the Last Year: Never true  Ran Out of Food in the Last Year: Never true  Transportation Needs: No Transportation Needs (05/09/2023)  Received from [facility] - Transportation  Lack of Transportation (Medical): No  Lack of Transportation (Non-Medical): No  Physical Activity: Insufficiently Active (05/09/2023)  Received from [facility]  Exercise Vital Sign  Days of Exercise per Week: 1 day  Minutes of Exercise per Session: 30 min  Stress: Stress Concern Present (05/09/2023)  Received from [facility] - Occupational Stress Questionnaire  Feeling of Stress : Rather much  Social Connections: Moderately Integrated (05/09/2023)  Received from [facility]  Social Connection and Isolation Panel [NHANES]  Frequency of Communication with Friends and Family: More than three times a week  Frequency of Social Gatherings with Friends and Family: Once a week  Attends Religious Services: More than 4 times per year  Active Member of Clubs or Organizations: No  Marital Status: Married  Housing Stability: Unknown (08/18/2023)  Housing Stability Vital Sign  Homeless in the Last Year: No   Objective:   Vitals:  BP: 128/76  Pulse: 96  Temp: 36.8 C (98.3 F)  SpO2: 98%  Weight: 83.3 kg (183 lb 9.6 oz)  Height: 182.9 cm (6')  PainSc: 4  PainLoc: Neck   Body  mass index is 24.9 kg/m.  Physical Exam   GENERAL APPEARANCE Comfortable, no acute issues Development: normal Gross deformities: none  SKIN Rash, lesions, ulcers: none Induration, erythema: Erythematous raised mildly tender lesion right shoulder consistent with small cutaneous abscess likely arising in an epidermal inclusion cyst Nodules: none palpable  EARS, NOSE, MOUTH, THROAT External ears: no lesion or deformity External nose: no lesion or deformity Hearing: grossly normal  NECK Symmetric: yes Trachea: midline Thyroid: no palpable nodules in the thyroid bed  CHEST/CV Not assessed  ABDOMEN Not assessed  GENITOURINARY/RECTAL Not assessed  MUSCULOSKELETAL Station and gait: normal Digits and nails: no clubbing or cyanosis Muscle strength: grossly normal all extremities Deformity: none  LYMPHATIC Cervical: none palpable Supraclavicular: none palpable  PSYCHIATRIC Oriented to person, place, and time: yes Mood and affect: normal for situation Judgment and insight: appropriate for situation   Assessment and Plan:   Cutaneous abscess of right shoulder  Patient is referred by his primary care provider for surgical evaluation and management of a cutaneous lesion on the right shoulder. This appears to be an abscess arising in an epidermal inclusion cyst. It has been partially treated with oral antibiotics.  I would like to continue the patient on Bactrim DS twice  daily until the time of surgery. I will enter that prescription. We will arrange for surgical excision as an outpatient surgical procedure next week under sedation and local anesthesia. We discussed the procedure today. The patient understands and agrees to proceed.   Darnell Level, MD [facility] practice Office: [phone]

## 2023-08-31 ENCOUNTER — Encounter: Payer: Self-pay | Admitting: Internal Medicine

## 2023-08-31 ENCOUNTER — Encounter (HOSPITAL_COMMUNITY): Payer: Self-pay | Admitting: Licensed Clinical Social Worker

## 2023-09-01 ENCOUNTER — Encounter (HOSPITAL_COMMUNITY): Payer: Self-pay | Admitting: Licensed Clinical Social Worker

## 2023-09-01 NOTE — Progress Notes (Signed)
Virtual virtual Video Note  I connected with Alex [name] on 08/20/2023 at 1-2pm EST by video-enabled virtual visit. I verified that I am speaking with the correct person using  two identifiers.I discussed the limitations of evaluation and management by telemedicine and the availability of in person appointments. The patient expressed understanding and agreed to proceed.    LOCATION: Patient: Home  Provider: Home office   History of Present Illness:  Pt was referred by Dr. Lolly Mustache for OP therapy for bipolar disorder and anxiety.  Treatment Goal Addressed:  Pt will meet with clinician 1x every 2 weeks for therapy to monitor for progress towards goals and address any barriers to success; Reduce depression from average severity level of 6/10 down to a 4/10 in next 90 days by engaging in 1-2 positive coping skills daily as part of developing self-care routine; Reduce average anxiety level from 7/10 down to 5/10 in next 90 days by utilizing 1-2 relaxation skills/grounding skills per day, such as mindful breathing, progressive muscle relaxation, positive visualizations.  Progress towards Treatment Goal: Progressing   Observations/Objective: Patient presented for today's session on time and was alert, oriented x5, with no evidence or self-report of SI/HI or A/V H.  Patient reported ongoing compliance with medication and denied any use of alcohol or illicit substances.  Clinician inquired about patient's current emotional ratings, as well as any significant changes in thoughts, feelings or behavior since previous session. Patient reported scores of  5/10 for depression, 5/10 for anxiety, 3/10 for anger/irritability. Pt explored his emotional ratings, and discussed coping skills for stressors. Pt identifies stressors and  allowed pt to explore and express thoughts and feelings associated with recent life situations and external stressors.[name] and pt reviewed his continued stressors individually: marital  issues, son's mental health, physical health, future, pt's sexual health after prostate cancer, lack of marital communication. [name] and pt discussed his holidays with his family. "I had a good holiday with my family with a few stressors." [name] asked open ended questions. Clinician utilized CBT to process thoughts, feelings, and behaviors about his current stressors.  Assessment and plan: Counselor will continue to meet with patient to address treatment plan goals. Patient will continue to follow recommendations of providers and imnd implement skills learned in session, and practice between sessions. Diagnosis: Bipolar 1 disorder.    Collaboration of care: Other:   Continue working with providers    Patient/Guardian was advised Release of Information must be obtained prior to any record release in order to collaborate their care with an outside provider. Patient/Guardian was advised if they have not already done so to contact the registration department to sign all necessary forms in order for [place] to release information regarding their care.    Consent: Patient/Guardian gives verbal consent for treatment and assignment of benefits for services provided during this visit. Patient/Guardian expressed understanding and agreed to proceed.     Follow Up Instructions:  I discussed the assessment and treatment plan with the patient. The patient was provided an opportunity to ask questions and all were answered. The patient agreed with the plan and demonstrated an understanding of the instructions.   The patient was advised to call back or seek an in-person evaluation if the symptoms worsen or if the condition fails to improve as anticipated.  I provided 60 minutes of non-face-to-face time during this encounter.   Corinn Stoltzfus S, LCAS  08/20/23

## 2023-09-02 ENCOUNTER — Ambulatory Visit (INDEPENDENT_AMBULATORY_CARE_PROVIDER_SITE_OTHER): Payer: BLUE CROSS/BLUE SHIELD | Admitting: Licensed Clinical Social Worker

## 2023-09-02 DIAGNOSIS — F319 Bipolar disorder, unspecified: Secondary | ICD-10-CM

## 2023-09-03 ENCOUNTER — Ambulatory Visit (HOSPITAL_COMMUNITY): Payer: BLUE CROSS/BLUE SHIELD | Admitting: Licensed Clinical Social Worker

## 2023-09-03 ENCOUNTER — Encounter (HOSPITAL_BASED_OUTPATIENT_CLINIC_OR_DEPARTMENT_OTHER): Payer: Self-pay | Admitting: Surgery

## 2023-09-03 DIAGNOSIS — M25542 Pain in joints of left hand: Secondary | ICD-10-CM | POA: Diagnosis not present

## 2023-09-03 DIAGNOSIS — M25521 Pain in right elbow: Secondary | ICD-10-CM | POA: Diagnosis not present

## 2023-09-03 DIAGNOSIS — M180 Bilateral primary osteoarthritis of first carpometacarpal joints: Secondary | ICD-10-CM | POA: Diagnosis not present

## 2023-09-03 DIAGNOSIS — M19021 Primary osteoarthritis, right elbow: Secondary | ICD-10-CM | POA: Diagnosis not present

## 2023-09-03 DIAGNOSIS — M19041 Primary osteoarthritis, right hand: Secondary | ICD-10-CM | POA: Diagnosis not present

## 2023-09-03 DIAGNOSIS — M79641 Pain in right hand: Secondary | ICD-10-CM | POA: Diagnosis not present

## 2023-09-03 DIAGNOSIS — M19042 Primary osteoarthritis, left hand: Secondary | ICD-10-CM | POA: Diagnosis not present

## 2023-09-03 NOTE — Progress Notes (Signed)
Spoke w/ via phone for pre-op interview---Left message on voicemail. Lab needs dos---- none        Lab results------08/26/23 cbc, cmp in Epic, 03/12/23 EKG in Epic, 03/31/23 chest xray in Epic, 03/18/23 Coronary CT in Epic COVID test ---patient states asymptomatic no test needed Arrive at -------0930 on Thursday, 09/04/23 NPO after MN NO Solid Food.  Clear liquids from MN until---0830 Med rec completed Medications to take morning of surgery -----Amlodipine, Lipitor, Lamtrigine, eye gtts, Synthroid, Omeprazole, Flomax prn, Tramadol prn Diabetic medication -----n/a Patient instructed no nail polish to be worn day of surgery Patient instructed to bring photo id and insurance card day of surgery Patient aware to have Driver (ride ) / caregiver    for 24 hours after surgery -  Patient Special Instructions ----- Pre-Op special Instructions ----- Patient verbalized understanding of instructions that were given at this phone interview. Patient denies chest pain, sob, fever, cough at the interview.   I was unable to speak with patient to update history and medications. I left a detailed message with instructions for surgery on 09/04/23 on his answering machine.  Please also see previous progress note from this RN on 09/03/23 concerning anesthesia consultation.

## 2023-09-03 NOTE — Progress Notes (Addendum)
Due to medical complexity, including common variable immunodeficiency, hx of multiple cancers, IDA, BPH with urinary obstruction, coronary atheroschlerosis, glaucoma, hepatic cysts, HLD, HTN, ascending aortic aneurysm 4.2 cm per 03/18/23 Coronary CT in Epic,  (for complete list see history in Epic.) I reviewed this case with Dr. Stephannie Peters, MDA. Patient did receive cardiac clearance for a surgery on his finger in 07/2023 (in chart.) Patient is scheduled for MAC and local anesthesia for surgery at [facility] on 09/03/23. 03/12/23 EKG in Epic 03/31/23 chest xray in Epic 03/18/23 Coronary CTA in Epic  Per Dr. Stephannie Peters, MDA, it is okay to proceed with surgery at [facility] on 09/04/23.

## 2023-09-04 ENCOUNTER — Ambulatory Visit (HOSPITAL_BASED_OUTPATIENT_CLINIC_OR_DEPARTMENT_OTHER): Payer: Self-pay | Admitting: Anesthesiology

## 2023-09-04 ENCOUNTER — Encounter (HOSPITAL_BASED_OUTPATIENT_CLINIC_OR_DEPARTMENT_OTHER): Payer: Self-pay | Admitting: Surgery

## 2023-09-04 ENCOUNTER — Encounter (HOSPITAL_BASED_OUTPATIENT_CLINIC_OR_DEPARTMENT_OTHER)
Admission: RE | Disposition: A | Discharge status: Home / Self Care | Payer: Self-pay | Source: Home / Self Care | Attending: Surgery

## 2023-09-04 ENCOUNTER — Other Ambulatory Visit: Payer: Self-pay

## 2023-09-04 ENCOUNTER — Ambulatory Visit (HOSPITAL_BASED_OUTPATIENT_CLINIC_OR_DEPARTMENT_OTHER)
Admission: RE | Admit: 2023-09-04 | Discharge: 2023-09-04 | Disposition: A | Discharge status: Home / Self Care | Payer: BLUE CROSS/BLUE SHIELD | Attending: Surgery | Admitting: Surgery

## 2023-09-04 DIAGNOSIS — Z792 Long term (current) use of antibiotics: Secondary | ICD-10-CM | POA: Diagnosis not present

## 2023-09-04 DIAGNOSIS — E039 Hypothyroidism, unspecified: Secondary | ICD-10-CM | POA: Diagnosis not present

## 2023-09-04 DIAGNOSIS — L089 Local infection of the skin and subcutaneous tissue, unspecified: Secondary | ICD-10-CM | POA: Diagnosis not present

## 2023-09-04 DIAGNOSIS — D849 Immunodeficiency, unspecified: Secondary | ICD-10-CM | POA: Diagnosis not present

## 2023-09-04 DIAGNOSIS — E1151 Type 2 diabetes mellitus with diabetic peripheral angiopathy without gangrene: Secondary | ICD-10-CM | POA: Insufficient documentation

## 2023-09-04 DIAGNOSIS — K219 Gastro-esophageal reflux disease without esophagitis: Secondary | ICD-10-CM | POA: Diagnosis not present

## 2023-09-04 DIAGNOSIS — I10 Essential (primary) hypertension: Secondary | ICD-10-CM | POA: Insufficient documentation

## 2023-09-04 DIAGNOSIS — K449 Diaphragmatic hernia without obstruction or gangrene: Secondary | ICD-10-CM | POA: Diagnosis not present

## 2023-09-04 DIAGNOSIS — I2510 Atherosclerotic heart disease of native coronary artery without angina pectoris: Secondary | ICD-10-CM | POA: Diagnosis not present

## 2023-09-04 DIAGNOSIS — L0291 Cutaneous abscess, unspecified: Secondary | ICD-10-CM | POA: Diagnosis present

## 2023-09-04 DIAGNOSIS — L02413 Cutaneous abscess of right upper limb: Secondary | ICD-10-CM | POA: Insufficient documentation

## 2023-09-04 DIAGNOSIS — Z8711 Personal history of peptic ulcer disease: Secondary | ICD-10-CM | POA: Diagnosis not present

## 2023-09-04 DIAGNOSIS — M71311 Other bursal cyst, right shoulder: Secondary | ICD-10-CM | POA: Diagnosis not present

## 2023-09-04 DIAGNOSIS — L723 Sebaceous cyst: Secondary | ICD-10-CM | POA: Diagnosis not present

## 2023-09-04 HISTORY — DX: Malignant neoplasm of prostate: C61

## 2023-09-04 HISTORY — PX: CYST EXCISION: SHX5701

## 2023-09-04 SURGERY — CYST REMOVAL
Anesthesia: Monitor Anesthesia Care | Laterality: Right

## 2023-09-04 MED ORDER — ONDANSETRON HCL 4 MG/2ML IJ SOLN
INTRAMUSCULAR | Status: DC | PRN
  Administered 2023-09-04: 4 mg via INTRAVENOUS

## 2023-09-04 MED ORDER — CHLORHEXIDINE GLUCONATE CLOTH 2 % EX PADS: 6.0000 | MEDICATED_PAD | Freq: Once | CUTANEOUS | Status: DC

## 2023-09-04 MED ORDER — ONDANSETRON HCL 4 MG/2ML IJ SOLN
INTRAMUSCULAR | Status: AC
  Filled 2023-09-04: qty 4

## 2023-09-04 MED ORDER — LACTATED RINGERS IV SOLN: INTRAVENOUS | Status: DC

## 2023-09-04 MED ORDER — FENTANYL CITRATE (PF) 250 MCG/5ML IJ SOLN
INTRAMUSCULAR | Status: DC | PRN
  Administered 2023-09-04 (×2): 50 ug via INTRAVENOUS

## 2023-09-04 MED ORDER — ACETAMINOPHEN 500 MG PO TABS: 1000.0000 mg | ORAL_TABLET | Freq: Once | ORAL | Status: DC

## 2023-09-04 MED ORDER — DEXMEDETOMIDINE HCL IN NACL 80 MCG/20ML IV SOLN
INTRAVENOUS | Status: AC
  Filled 2023-09-04: qty 20

## 2023-09-04 MED ORDER — PHENYLEPHRINE 80 MCG/ML (10ML) SYRINGE FOR IV PUSH (FOR BLOOD PRESSURE SUPPORT)
PREFILLED_SYRINGE | INTRAVENOUS | Status: DC | PRN
  Administered 2023-09-04: 160 ug via INTRAVENOUS
  Administered 2023-09-04: 80 ug via INTRAVENOUS
  Administered 2023-09-04: 160 ug via INTRAVENOUS

## 2023-09-04 MED ORDER — TRAMADOL HCL 50 MG PO TABS: 50.0000 mg | ORAL_TABLET | Freq: Four times a day (QID) | ORAL | 0 refills | Status: DC | PRN

## 2023-09-04 MED ORDER — FENTANYL CITRATE (PF) 100 MCG/2ML IJ SOLN: 25.0000 ug | INTRAMUSCULAR | Status: DC | PRN

## 2023-09-04 MED ORDER — FENTANYL CITRATE (PF) 100 MCG/2ML IJ SOLN
INTRAMUSCULAR | Status: AC
  Filled 2023-09-04: qty 2

## 2023-09-04 MED ORDER — CEFAZOLIN SODIUM-DEXTROSE 2-4 GM/100ML-% IV SOLN
2.0000 g | INTRAVENOUS | Status: AC
  Administered 2023-09-04: 2 g via INTRAVENOUS

## 2023-09-04 MED ORDER — PROPOFOL 500 MG/50ML IV EMUL
INTRAVENOUS | Status: DC | PRN
  Administered 2023-09-04: 180 ug/kg/min via INTRAVENOUS

## 2023-09-04 MED ORDER — CEFAZOLIN SODIUM-DEXTROSE 2-4 GM/100ML-% IV SOLN
INTRAVENOUS | Status: AC
Start: 2023-09-04 — End: ?
  Filled 2023-09-04: qty 100

## 2023-09-04 MED ORDER — BUPIVACAINE-EPINEPHRINE (PF) 0.25% -1:200000 IJ SOLN
INTRAMUSCULAR | Status: DC | PRN
  Administered 2023-09-04: 10 mL

## 2023-09-04 MED ORDER — PROPOFOL 1000 MG/100ML IV EMUL
INTRAVENOUS | Status: AC
  Filled 2023-09-04: qty 100

## 2023-09-04 MED ORDER — EPHEDRINE 5 MG/ML INJ
INTRAVENOUS | Status: AC
  Filled 2023-09-04: qty 5

## 2023-09-04 MED ORDER — ROCURONIUM BROMIDE 10 MG/ML (PF) SYRINGE
PREFILLED_SYRINGE | INTRAVENOUS | Status: AC
  Filled 2023-09-04: qty 10

## 2023-09-04 MED ORDER — LIDOCAINE 2% (20 MG/ML) 5 ML SYRINGE
INTRAMUSCULAR | Status: DC | PRN
  Administered 2023-09-04: 40 mg via INTRAVENOUS

## 2023-09-04 MED ORDER — PROPOFOL 10 MG/ML IV BOLUS
INTRAVENOUS | Status: DC | PRN
  Administered 2023-09-04: 20 mg via INTRAVENOUS

## 2023-09-04 MED ORDER — LIDOCAINE HCL (PF) 2 % IJ SOLN
INTRAMUSCULAR | Status: AC
  Filled 2023-09-04: qty 10

## 2023-09-04 MED ORDER — PHENYLEPHRINE 80 MCG/ML (10ML) SYRINGE FOR IV PUSH (FOR BLOOD PRESSURE SUPPORT)
PREFILLED_SYRINGE | INTRAVENOUS | Status: AC
  Filled 2023-09-04: qty 10

## 2023-09-04 SURGICAL SUPPLY — 23 items
BLADE SURG 15 STRL LF DISP TIS (BLADE) ×1 IMPLANT
CHLORAPREP W/TINT 26 (MISCELLANEOUS) ×1 IMPLANT
COVER BACK TABLE 60X90IN (DRAPES) ×1 IMPLANT
COVER MAYO STAND STRL (DRAPES) ×1 IMPLANT
DERMABOND ADVANCED .7 DNX12 (GAUZE/BANDAGES/DRESSINGS) IMPLANT
DRAPE LAPAROTOMY 100X72 PEDS (DRAPES) IMPLANT
DRAPE UTILITY XL STRL (DRAPES) ×1 IMPLANT
ELECT REM PT RETURN 9FT ADLT (ELECTROSURGICAL) ×1
ELECTRODE REM PT RTRN 9FT ADLT (ELECTROSURGICAL) ×1 IMPLANT
GLOVE SURG ORTHO 8.0 STRL STRW (GLOVE) ×1 IMPLANT
GOWN STRL REUS W/TWL LRG LVL3 (GOWN DISPOSABLE) ×1 IMPLANT
GOWN STRL REUS W/TWL XL LVL3 (GOWN DISPOSABLE) ×1 IMPLANT
KIT TURNOVER CYSTO (KITS) ×1 IMPLANT
NDL HYPO 25X1 1.5 SAFETY (NEEDLE) ×1 IMPLANT
NEEDLE HYPO 25X1 1.5 SAFETY (NEEDLE) ×1
PACK BASIN DAY SURGERY FS (CUSTOM PROCEDURE TRAY) ×1 IMPLANT
PENCIL SMOKE EVACUATOR (MISCELLANEOUS) ×1 IMPLANT
SLEEVE SCD COMPRESS KNEE MED (STOCKING) ×1 IMPLANT
SUT MNCRL AB 4-0 PS2 18 (SUTURE) ×1 IMPLANT
SUT VIC AB 3-0 SH 27X BRD (SUTURE) IMPLANT
SYR CONTROL 10ML LL (SYRINGE) ×1 IMPLANT
TOWEL OR 17X24 6PK STRL BLUE (TOWEL DISPOSABLE) ×1 IMPLANT
WATER STERILE IRR 500ML POUR (IV SOLUTION) IMPLANT

## 2023-09-04 NOTE — Transfer of Care (Signed)
Immediate Anesthesia Transfer of Care Note  Patient: Alex [name]  Procedure(s) Performed: EXCISION CYST RIGHT SHOULDER (Right)  Patient Location: PACU  Anesthesia Type:General  Level of Consciousness: drowsy and patient cooperative  Airway & Oxygen Therapy: Patient Spontanous Breathing  Post-op Assessment: Report given to RN and Post -op Vital signs reviewed and stable  Post vital signs: Reviewed and stable  Last Vitals:  Vitals Value Taken Time  BP 93/64 09/04/23 1233  Temp 36.1 C 09/04/23 1233  Pulse 66 09/04/23 1237  Resp 18 09/04/23 1237  SpO2 97 % 09/04/23 1237  Vitals shown include unfiled device data.  Last Pain:  Vitals:   09/04/23 1000  TempSrc: Oral  PainSc: 3       Patients Stated Pain Goal: 3 (09/04/23 1000)  Complications: No notable events documented.

## 2023-09-04 NOTE — Anesthesia Postprocedure Evaluation (Signed)
Anesthesia Post Note  Patient: Alex [name]  Procedure(s) Performed: EXCISION CYST RIGHT SHOULDER (Right)     Patient location during evaluation: PACU Anesthesia Type: MAC Level of consciousness: awake and alert Pain management: pain level controlled Vital Signs Assessment: post-procedure vital signs reviewed and stable Respiratory status: spontaneous breathing, nonlabored ventilation and respiratory function stable Cardiovascular status: stable and blood pressure returned to baseline Postop Assessment: no apparent nausea or vomiting Anesthetic complications: no  No notable events documented.  Last Vitals:  Vitals:   09/04/23 1245 09/04/23 1300  BP: 109/79 (!) 127/98  Pulse: 67 78  Resp: 12 13  Temp:    SpO2: 98% 92%    Last Pain:  Vitals:   09/04/23 1300  TempSrc:   PainSc: 0-No pain                 Jadee Golebiewski,W. EDMOND

## 2023-09-04 NOTE — Anesthesia Preprocedure Evaluation (Addendum)
Anesthesia Evaluation  Patient identified by MRN, date of birth, ID band Patient awake    Reviewed: Allergy & Precautions, H&P , NPO status , Patient's Chart, lab work & pertinent test results  Airway Mallampati: II  TM Distance: >3 FB Neck ROM: Full    Dental no notable dental hx. (+) Teeth Intact, Dental Advisory Given   Pulmonary asthma    Pulmonary exam normal breath sounds clear to auscultation       Cardiovascular hypertension, + CAD and + Peripheral Vascular Disease   Rhythm:Regular Rate:Normal     Neuro/Psych   Anxiety Depression Bipolar Disorder   negative neurological ROS     GI/Hepatic Neg liver ROS, hiatal hernia, PUD,GERD  Medicated,,  Endo/Other  Hypothyroidism    Renal/GU negative Renal ROS  negative genitourinary   Musculoskeletal  (+) Arthritis , Osteoarthritis,    Abdominal   Peds  Hematology  (+) Blood dyscrasia, anemia   Anesthesia Other Findings   Reproductive/Obstetrics negative OB ROS                             Anesthesia Physical Anesthesia Plan  ASA: 3  Anesthesia Plan: MAC   Post-op Pain Management: Tylenol PO (pre-op)*   Induction: Intravenous  PONV Risk Score and Plan: 2 and Ondansetron, Dexamethasone and Propofol infusion  Airway Management Planned: Natural Airway and Simple Face Mask  Additional Equipment:   Intra-op Plan:   Post-operative Plan:   Informed Consent: I have reviewed the patients History and Physical, chart, labs and discussed the procedure including the risks, benefits and alternatives for the proposed anesthesia with the patient or authorized representative who has indicated his/her understanding and acceptance.     Dental advisory given  Plan Discussed with: CRNA  Anesthesia Plan Comments:        Anesthesia Quick Evaluation

## 2023-09-04 NOTE — Op Note (Signed)
Operative Note  Pre-operative Diagnosis:  cystic mass right shoulder with abscess (2 x 1.5 x 1.5 cm)  Post-operative Diagnosis:  same  Surgeon:  Darnell Level, MD  Assistant:  none   Procedure:  excision cystic mass right shoulder with abscess (2 x 1.5 x 1.5 cm), subcutaneous  Anesthesia:  local with IV sedation  Estimated Blood Loss:  minimal  Drains: none         Specimen: to pathology  Indications:  Patient is referred by his primary care providers with an abscess of the skin on the right shoulder. This had been started on antibiotics about 1 week ago. It is had some improvement. There has been no drainage. Patient is now referred for surgical management. 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 He has had no previous such abscesses. He is currently taking Bactrim.   Procedure:  The patient was seen in the pre-op holding area. The risks, benefits, complications, treatment options, and expected outcomes were previously discussed with the patient. The patient agreed with the proposed plan and has signed the informed consent form.  The patient was brought to the operating room by the surgical team, identified as Alex [name] and the procedure verified. A "time out" was completed and the above information confirmed.  Following administration of intravenous sedation, the patient is turned to a left lateral decubitus position on the beanbag and properly secured.  The skin of the right shoulder is then prepped and draped in usual aseptic fashion.  After ascertaining that adequate level sedation had been maintained, the skin is anesthetized with local anesthetic.  Using a #15 blade an elliptical incision is made so as to excised the cystic mass on the posterior right shoulder.  This appears to be slightly ulcerated.  Dissection is carried into the subcutaneous tissues.  Electrocautery is used for hemostasis.  The mass is excised down to the underlying musculature.  It is excised in its  entirety and submitted to pathology for review.  It measured (2 x 1.5 x 1.5 cm).  Skin flaps were elevated circumferentially.  Hemostasis is achieved with the electrocautery.  Subcutaneous tissues are closed with interrupted 3-0 Vicryl sutures.  Skin is closed with a running 4-0 Monocryl subcuticular suture.  Wound is washed and dried and Dermabond is applied as dressing.  Patient is awakened from anesthesia and transported to the recovery room.  The patient tolerated the procedure well.   Darnell Level, MD [name] Office: [phone]

## 2023-09-04 NOTE — Anesthesia Procedure Notes (Signed)
Procedure Name: MAC Date/Time: 09/04/2023 11:51 AM  Performed by: Dairl Ponder, CRNAPre-anesthesia Checklist: Patient identified, Emergency Drugs available, Suction available and Patient being monitored Oxygen Delivery Method: Simple face mask Ventilation: Oral airway inserted - appropriate to patient size Number of attempts: 1 Placement Confirmation: positive ETCO2 Tube secured with: Tape Dental Injury: Teeth and Oropharynx as per pre-operative assessment

## 2023-09-04 NOTE — Discharge Instructions (Addendum)
  CENTRAL  SURGERY -- DISCHARGE INSTRUCTIONS  REMINDER:   Carry a list of your medications and allergies with you at all times  Call your pharmacy at least 1 week in advance to refill prescriptions  Do not mix any prescribed pain medicine with alcohol  Do not drive any motor vehicles while taking pain medication  Take medications with food unless otherwise directed  Follow-up appointments (date to return to physician): Please call [phone] to confirm your follow up appointment with your surgeon.  Call your Surgeon if you have:  Temperature greater than 101.0  Persistent nausea and vomiting  Severe uncontrolled pain  Redness, tenderness, or signs of infection (pain, swelling, redness, odor or green/yellow discharge around the site)  Difficulty breathing, headache or visual disturbances  Hives  Persistent dizziness or light-headedness  Any other questions or concerns you may have after discharge  In an emergency, call 911 or go to an Emergency Department at a nearby hospital.  Diet: Begin with liquids, and if they are tolerated, resume your usual diet.  Avoid spicy, greasy or heavy foods.  If you have nausea or vomiting, go back to liquids.  If you cannot keep liquids down, call your doctor.  Avoid alcohol consumption while on prescription pain medications. Good nutrition promotes healing. Increase fiber and fluids.   ADDITIONAL INSTRUCTIONS: Leave Dermabond in place for 7-10 days.  May shower.  May use ice pack.  May take Tylenol of Advil as needed for pain.  Rx at pharmacy for pain medication if needed.  [facility] Office: [phone]       No acetaminophen/Tylenol until after 2:00 pm today if needed.     Post Anesthesia Home Care Instructions  Activity: Get plenty of rest for the remainder of the day. A responsible individual must stay with you for 24 hours following the procedure.  For the next 24 hours, DO NOT: -Drive a car -Social worker -Drink alcoholic beverages -Take any medication unless instructed by your physician -Make any legal decisions or sign important papers.  Meals: Start with liquid foods such as gelatin or soup. Progress to regular foods as tolerated. Avoid greasy, spicy, heavy foods. If nausea and/or vomiting occur, drink only clear liquids until the nausea and/or vomiting subsides. Call your physician if vomiting continues.  Special Instructions/Symptoms: Your throat may feel dry or sore from the anesthesia or the breathing tube placed in your throat during surgery. If this causes discomfort, gargle with warm salt water. The discomfort should disappear within 24 hours.

## 2023-09-04 NOTE — Interval H&P Note (Signed)
History and Physical Interval Note:  09/04/2023 10:59 AM  Alex [name]  has presented today for surgery, with the diagnosis of Cystic Mass Right Shoulder with Abscess.  The various methods of treatment have been discussed with the patient and family. After consideration of risks, benefits and other options for treatment, the patient has consented to    Procedure(s) with comments: EXCISION CYST RIGHT SHOULDER (Right) - LOCAL & MAC as a surgical intervention.    The patient's history has been reviewed, patient examined, no change in status, stable for surgery.  I have reviewed the patient's chart and labs.  Questions were answered to the patient's satisfaction.    Darnell Level, MD [facility] practice Office: [phone]   Darnell Level

## 2023-09-05 ENCOUNTER — Encounter (HOSPITAL_BASED_OUTPATIENT_CLINIC_OR_DEPARTMENT_OTHER): Payer: Self-pay | Admitting: Surgery

## 2023-09-05 ENCOUNTER — Encounter: Payer: Self-pay | Admitting: Surgery

## 2023-09-05 LAB — SURGICAL PATHOLOGY

## 2023-09-05 NOTE — Progress Notes (Signed)
Path is benign, as expected.  Darnell Level, MD [facility] practice Office: [phone]

## 2023-09-07 ENCOUNTER — Encounter (HOSPITAL_COMMUNITY): Payer: Self-pay | Admitting: Licensed Clinical Social Worker

## 2023-09-07 NOTE — Progress Notes (Signed)
 Virtual virtual Video Note  I connected with Alex [name] on 08/26/2023 at 1-2pm EST by video-enabled virtual visit. I verified that I am speaking with the correct person using  two identifiers.I discussed the limitations of evaluation and management by telemedicine and the availability of in person appointments. The patient expressed understanding and agreed to proceed.    LOCATION: Patient: Home  Provider: Home office   History of Present Illness:  Pt was referred by Dr. Arfeen for OP therapy for bipolar disorder and anxiety.  Treatment Goal Addressed:  Pt will meet with clinician 1x every 2 weeks for therapy to monitor for progress towards goals and address any barriers to success; Reduce depression from average severity level of 6/10 down to a 4/10 in next 90 days by engaging in 1-2 positive coping skills daily as part of developing self-care routine; Reduce average anxiety level from 7/10 down to 5/10 in next 90 days by utilizing 1-2 relaxation skills/grounding skills per day, such as mindful breathing, progressive muscle relaxation, positive visualizations.  Progress towards Treatment Goal: Progressing   Observations/Objective: Patient presented for today's session on time and was alert, oriented x5, with no evidence or self-report of SI/HI or A/V H.  Patient reported ongoing compliance with medication and denied any use of alcohol  or illicit substances.  Clinician inquired about patient's current emotional ratings, as well as any significant changes in thoughts, feelings or behavior since previous session. Patient reported scores of  5/10 for depression, 5/10 for anxiety, 3/10 for anger/irritability. Pt explored his emotional ratings, and discussed coping skills for stressors. Pt identifies stressors and  allowed pt to explore and express thoughts and feelings associated with recent life situations and external stressors.[name] and pt reviewed his continued stressors individually: marital  issues, son's mental health, physical health, future, pt's sexual health after prostate cancer, lack of marital communication. Pt reports in depth about his concern about his son's mental health. [name] asked open ended questions. Clinician utilized CBT to process concerns about son. Clinician processed thoughts, feelings, and behaviors. Clinician discussed coping skills and options for supporting herself through these challenging times.   Assessment and plan: Counselor will continue to meet with patient to address treatment plan goals. Patient will continue to follow recommendations of providers and imnd implement skills learned in session, and practice between sessions. Diagnosis: Bipolar 1 disorder.    Collaboration of care: Other:   Continue working with providers    Patient/Guardian was advised Release of Information must be obtained prior to any record release in order to collaborate their care with an outside provider. Patient/Guardian was advised if they have not already done so to contact the registration department to sign all necessary forms in order for us  to release information regarding their care.    Consent: Patient/Guardian gives verbal consent for treatment and assignment of benefits for services provided during this visit. Patient/Guardian expressed understanding and agreed to proceed.     Follow Up Instructions:  I discussed the assessment and treatment plan with the patient. The patient was provided an opportunity to ask questions and all were answered. The patient agreed with the plan and demonstrated an understanding of the instructions.   The patient was advised to call back or seek an in-person evaluation if the symptoms worsen or if the condition fails to improve as anticipated.  I provided 60 minutes of non-face-to-face time during this encounter.   Babetta Paterson S, LCAS  08/26/23

## 2023-09-09 ENCOUNTER — Encounter (HOSPITAL_COMMUNITY): Payer: Self-pay | Admitting: Licensed Clinical Social Worker

## 2023-09-09 ENCOUNTER — Ambulatory Visit (INDEPENDENT_AMBULATORY_CARE_PROVIDER_SITE_OTHER): Payer: BLUE CROSS/BLUE SHIELD | Admitting: Licensed Clinical Social Worker

## 2023-09-09 DIAGNOSIS — F319 Bipolar disorder, unspecified: Secondary | ICD-10-CM | POA: Diagnosis not present

## 2023-09-09 DIAGNOSIS — H3589 Other specified retinal disorders: Secondary | ICD-10-CM | POA: Diagnosis not present

## 2023-09-09 DIAGNOSIS — H401121 Primary open-angle glaucoma, left eye, mild stage: Secondary | ICD-10-CM | POA: Diagnosis not present

## 2023-09-09 DIAGNOSIS — H25812 Combined forms of age-related cataract, left eye: Secondary | ICD-10-CM | POA: Diagnosis not present

## 2023-09-09 DIAGNOSIS — H4051X3 Glaucoma secondary to other eye disorders, right eye, severe stage: Secondary | ICD-10-CM | POA: Diagnosis not present

## 2023-09-09 NOTE — Progress Notes (Signed)
Virtual virtual Video Note  I connected with Alex [name] on 09/02/2023 at 2-3pm EST by video-enabled virtual visit. I verified that I am speaking with the correct person using  two identifiers.I discussed the limitations of evaluation and management by telemedicine and the availability of in person appointments. The patient expressed understanding and agreed to proceed.    LOCATION: Patient: Home  Provider: Home office   History of Present Illness:  Pt was referred by Dr. Lolly Mustache for OP therapy for bipolar disorder and anxiety.  Treatment Goal Addressed:  Pt will meet with clinician 1x every 2 weeks for therapy to monitor for progress towards goals and address any barriers to success; Reduce depression from average severity level of 6/10 down to a 4/10 in next 90 days by engaging in 1-2 positive coping skills daily as part of developing self-care routine; Reduce average anxiety level from 7/10 down to 5/10 in next 90 days by utilizing 1-2 relaxation skills/grounding skills per day, such as mindful breathing, progressive muscle relaxation, positive visualizations.  Progress towards Treatment Goal: Progressing   Observations/Objective: Patient presented for today's session on time and was alert, oriented x5, with no evidence or self-report of SI/HI or A/V H.  Patient reported ongoing compliance with medication and denied any use of alcohol or illicit substances.  Clinician inquired about patient's current emotional ratings, as well as any significant changes in thoughts, feelings or behavior since previous session. Patient reported scores of  5/10 for depression, 5/10 for anxiety, 3/10 for anger/irritability. Pt explored his emotional ratings, and discussed coping skills for stressors. Pt identifies stressors and  allowed pt to explore and express thoughts and feelings associated with recent life situations and external stressors.[name] and pt reviewed his continued stressors individually: marital  issues, son's mental health, physical health, future, pt's sexual health after prostate cancer, lack of marital communication. Pt  has had some medical concerns and will have an outpatient surgery this week, boil on his neck removed. Pt began a discussion on his future: health, next phase of life, relationship.      Assessment and plan: Counselor will continue to meet with patient to address treatment plan goals. Patient will continue to follow recommendations of providers and imnd implement skills learned in session, and practice between sessions. Diagnosis: Bipolar 1 disorder.    Collaboration of care: Other:   Continue working with providers    Patient/Guardian was advised Release of Information must be obtained prior to any record release in order to collaborate their care with an outside provider. Patient/Guardian was advised if they have not already done so to contact the registration department to sign all necessary forms in order for [place] to release information regarding their care.    Consent: Patient/Guardian gives verbal consent for treatment and assignment of benefits for services provided during this visit. Patient/Guardian expressed understanding and agreed to proceed.     Follow Up Instructions:  I discussed the assessment and treatment plan with the patient. The patient was provided an opportunity to ask questions and all were answered. The patient agreed with the plan and demonstrated an understanding of the instructions.   The patient was advised to call back or seek an in-person evaluation if the symptoms worsen or if the condition fails to improve as anticipated.  I provided 60 minutes of non-face-to-face time during this encounter.   Caryl Manas S, LCAS  09/02/23

## 2023-09-14 ENCOUNTER — Encounter (HOSPITAL_COMMUNITY): Payer: Self-pay | Admitting: Licensed Clinical Social Worker

## 2023-09-14 NOTE — Progress Notes (Signed)
Virtual virtual Video Note  I connected with Alex [name] on 09/09/2023 at 2-3pm EST by video-enabled virtual visit. I verified that I am speaking with the correct person using  two identifiers.I discussed the limitations of evaluation and management by telemedicine and the availability of in person appointments. The patient expressed understanding and agreed to proceed.    LOCATION: Patient: Home  Provider: Home office   History of Present Illness:  Pt was referred by Dr. Lolly Mustache for OP therapy for bipolar disorder and anxiety.  Treatment Goal Addressed:  Pt will meet with clinician 1x every 2 weeks for therapy to monitor for progress towards goals and address any barriers to success; Reduce depression from average severity level of 6/10 down to a 4/10 in next 90 days by engaging in 1-2 positive coping skills daily as part of developing self-care routine; Reduce average anxiety level from 7/10 down to 5/10 in next 90 days by utilizing 1-2 relaxation skills/grounding skills per day, such as mindful breathing, progressive muscle relaxation, positive visualizations.  Progress towards Treatment Goal: Progressing   Observations/Objective: Patient presented for today's session on time and was alert, oriented x5, with no evidence or self-report of SI/HI or A/V H.  Patient reported ongoing compliance with medication and denied any use of alcohol or illicit substances.  Clinician inquired about patient's current emotional ratings, as well as any significant changes in thoughts, feelings or behavior since previous session. Patient reported scores of  6/10 for depression, 5/10 for anxiety, 3/10 for anger/irritability. Pt explored his emotional ratings, and discussed coping skills for stressors. Pt identifies stressors and  allowed pt to explore and express thoughts and feelings associated with recent life situations and external stressors.[name] and pt reviewed his continued stressors individually: marital  issues, son's mental health, physical health, future, pt's sexual health after prostate cancer, lack of marital communication. Pt  had a medical procedure last week, boil on his neck removed, which was non-cancerous. Pt reports his son has been admitted to the hospital and may go to an inpt facility this week to become stabilized. [name] asked open ended questions about how this is affecting pt's mental health. Pt continued a discussion on his future: health, next phase of life, relationship.      Assessment and plan: Counselor will continue to meet with patient to address treatment plan goals. Patient will continue to follow recommendations of providers and imnd implement skills learned in session, and practice between sessions. Diagnosis: Bipolar 1 disorder.    Collaboration of care: Other:   Continue working with providers    Patient/Guardian was advised Release of Information must be obtained prior to any record release in order to collaborate their care with an outside provider. Patient/Guardian was advised if they have not already done so to contact the registration department to sign all necessary forms in order for [place] to release information regarding their care.    Consent: Patient/Guardian gives verbal consent for treatment and assignment of benefits for services provided during this visit. Patient/Guardian expressed understanding and agreed to proceed.     Follow Up Instructions:  I discussed the assessment and treatment plan with the patient. The patient was provided an opportunity to ask questions and all were answered. The patient agreed with the plan and demonstrated an understanding of the instructions.   The patient was advised to call back or seek an in-person evaluation if the symptoms worsen or if the condition fails to improve as anticipated.  I provided 60 minutes of  non-face-to-face time during this encounter.   Darchelle Nunes S, LCAS  09/09/23

## 2023-09-16 ENCOUNTER — Ambulatory Visit (HOSPITAL_COMMUNITY): Payer: BLUE CROSS/BLUE SHIELD | Admitting: Licensed Clinical Social Worker

## 2023-09-17 ENCOUNTER — Encounter: Payer: Self-pay | Admitting: Hematology & Oncology

## 2023-09-17 ENCOUNTER — Encounter (HOSPITAL_COMMUNITY): Payer: Self-pay | Admitting: Licensed Clinical Social Worker

## 2023-09-17 ENCOUNTER — Ambulatory Visit (INDEPENDENT_AMBULATORY_CARE_PROVIDER_SITE_OTHER): Payer: BLUE CROSS/BLUE SHIELD | Admitting: Licensed Clinical Social Worker

## 2023-09-17 DIAGNOSIS — F319 Bipolar disorder, unspecified: Secondary | ICD-10-CM

## 2023-09-17 NOTE — Progress Notes (Signed)
 Virtual virtual Video Note  I connected with Alex [name] on 09/17/2023 at 2-3pm EST by video-enabled virtual visit. I verified that I am speaking with the correct person using  two identifiers.I discussed the limitations of evaluation and management by telemedicine and the availability of in person appointments. The patient expressed understanding and agreed to proceed.    LOCATION: Patient: Home  Provider: Home office   History of Present Illness:  Pt was referred by Dr. Arfeen for OP therapy for bipolar disorder and anxiety.  Treatment Goal Addressed:  Pt will meet with clinician 1x every 2 weeks for therapy to monitor for progress towards goals and address any barriers to success; Reduce depression from average severity level of 6/10 down to a 4/10 in next 90 days by engaging in 1-2 positive coping skills daily as part of developing self-care routine; Reduce average anxiety level from 7/10 down to 5/10 in next 90 days by utilizing 1-2 relaxation skills/grounding skills per day, such as mindful breathing, progressive muscle relaxation, positive visualizations.  Progress towards Treatment Goal: Progressing   Observations/Objective: Patient presented for today's session on time and was alert, oriented x5, with no evidence or self-report of SI/HI or A/V H.  Patient reported ongoing compliance with medication and denied any use of alcohol  or illicit substances.  Clinician inquired about patient's current emotional ratings, as well as any significant changes in thoughts, feelings or behavior since previous session. Patient reported scores of  6/10 for depression, 5/10 for anxiety, 3/10 for anger/irritability. Pt explored his emotional ratings, and discussed coping skills for stressors. 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 Pt  has been to [facility] for 2 dr appts. Followup appt and eye injection. Pt reports the boil on his neck is returning. He will followup with his surgeon. Pt reports his son has been discharged from the facility with a change in medication and stabilization. Again, cln asked open ended questions about how this is affecting pt's mental health.Again, pt continued a discussion on his future: health, next phase of life, relationship.      Assessment and plan: Counselor will continue to meet with patient to address treatment plan goals. Patient will continue to follow recommendations of providers and imnd implement skills learned in session, and practice between sessions. Diagnosis: Bipolar 1 disorder.    Collaboration of care: Other:   Continue working with providers    Patient/Guardian was advised Release of Information must be obtained prior to any record release in order to collaborate their care with an outside provider. Patient/Guardian was advised if they have not already done so to contact the registration department to sign all necessary forms in order for us  to release information regarding their care.    Consent: Patient/Guardian gives verbal consent for treatment and assignment of benefits for services provided during this visit. Patient/Guardian expressed understanding and agreed to proceed.     Follow Up Instructions:  I discussed the assessment and treatment plan with the patient. The patient was provided an opportunity to ask questions and all were answered. The patient agreed with the plan and demonstrated an understanding of the instructions.   The patient was advised to call back or seek an in-person evaluation if the symptoms worsen  or if the condition fails to improve as anticipated.  I provided 60 minutes of  non-face-to-face time during this encounter.   Jessicalynn Deshong S, LCAS  09/17/23

## 2023-09-18 ENCOUNTER — Other Ambulatory Visit: Payer: Self-pay | Admitting: Cardiology

## 2023-09-18 DIAGNOSIS — M47816 Spondylosis without myelopathy or radiculopathy, lumbar region: Secondary | ICD-10-CM | POA: Diagnosis not present

## 2023-09-19 ENCOUNTER — Other Ambulatory Visit: Payer: Self-pay | Admitting: Physician Assistant

## 2023-09-19 DIAGNOSIS — M48062 Spinal stenosis, lumbar region with neurogenic claudication: Secondary | ICD-10-CM

## 2023-09-19 DIAGNOSIS — M47816 Spondylosis without myelopathy or radiculopathy, lumbar region: Secondary | ICD-10-CM

## 2023-09-22 DIAGNOSIS — H04121 Dry eye syndrome of right lacrimal gland: Secondary | ICD-10-CM | POA: Diagnosis not present

## 2023-09-22 DIAGNOSIS — H4051X0 Glaucoma secondary to other eye disorders, right eye, stage unspecified: Secondary | ICD-10-CM | POA: Diagnosis not present

## 2023-09-22 DIAGNOSIS — M76891 Other specified enthesopathies of right lower limb, excluding foot: Secondary | ICD-10-CM | POA: Diagnosis not present

## 2023-09-22 DIAGNOSIS — M170 Bilateral primary osteoarthritis of knee: Secondary | ICD-10-CM | POA: Diagnosis not present

## 2023-09-22 DIAGNOSIS — H02212 Cicatricial lagophthalmos right lower eyelid: Secondary | ICD-10-CM | POA: Diagnosis not present

## 2023-09-22 DIAGNOSIS — H2512 Age-related nuclear cataract, left eye: Secondary | ICD-10-CM | POA: Diagnosis not present

## 2023-09-22 DIAGNOSIS — M25462 Effusion, left knee: Secondary | ICD-10-CM | POA: Diagnosis not present

## 2023-09-22 DIAGNOSIS — H538 Other visual disturbances: Secondary | ICD-10-CM | POA: Diagnosis not present

## 2023-09-22 DIAGNOSIS — M25461 Effusion, right knee: Secondary | ICD-10-CM | POA: Diagnosis not present

## 2023-09-22 DIAGNOSIS — H353121 Nonexudative age-related macular degeneration, left eye, early dry stage: Secondary | ICD-10-CM | POA: Diagnosis not present

## 2023-09-22 DIAGNOSIS — H35362 Drusen (degenerative) of macula, left eye: Secondary | ICD-10-CM | POA: Diagnosis not present

## 2023-09-22 DIAGNOSIS — R600 Localized edema: Secondary | ICD-10-CM | POA: Diagnosis not present

## 2023-09-22 DIAGNOSIS — R231 Pallor: Secondary | ICD-10-CM | POA: Diagnosis not present

## 2023-09-22 DIAGNOSIS — C61 Malignant neoplasm of prostate: Secondary | ICD-10-CM | POA: Diagnosis not present

## 2023-09-22 DIAGNOSIS — H401121 Primary open-angle glaucoma, left eye, mild stage: Secondary | ICD-10-CM | POA: Diagnosis not present

## 2023-09-22 DIAGNOSIS — E119 Type 2 diabetes mellitus without complications: Secondary | ICD-10-CM | POA: Diagnosis not present

## 2023-09-22 DIAGNOSIS — H35351 Cystoid macular degeneration, right eye: Secondary | ICD-10-CM | POA: Diagnosis not present

## 2023-09-22 DIAGNOSIS — M25561 Pain in right knee: Secondary | ICD-10-CM | POA: Diagnosis not present

## 2023-09-22 DIAGNOSIS — T66XXXD Radiation sickness, unspecified, subsequent encounter: Secondary | ICD-10-CM | POA: Diagnosis not present

## 2023-09-22 DIAGNOSIS — H3589 Other specified retinal disorders: Secondary | ICD-10-CM | POA: Diagnosis not present

## 2023-09-23 ENCOUNTER — Ambulatory Visit (INDEPENDENT_AMBULATORY_CARE_PROVIDER_SITE_OTHER): Payer: BLUE CROSS/BLUE SHIELD | Admitting: Licensed Clinical Social Worker

## 2023-09-23 DIAGNOSIS — F319 Bipolar disorder, unspecified: Secondary | ICD-10-CM

## 2023-09-24 ENCOUNTER — Encounter: Payer: Self-pay | Admitting: Physician Assistant

## 2023-09-25 ENCOUNTER — Encounter (HOSPITAL_COMMUNITY): Payer: Self-pay | Admitting: Licensed Clinical Social Worker

## 2023-09-29 ENCOUNTER — Ambulatory Visit
Admission: RE | Admit: 2023-09-29 | Discharge: 2023-09-29 | Disposition: A | Discharge status: Home / Self Care | Payer: BLUE CROSS/BLUE SHIELD | Source: Ambulatory Visit | Attending: Physician Assistant | Admitting: Physician Assistant

## 2023-09-29 DIAGNOSIS — M48062 Spinal stenosis, lumbar region with neurogenic claudication: Secondary | ICD-10-CM

## 2023-09-29 DIAGNOSIS — Z981 Arthrodesis status: Secondary | ICD-10-CM | POA: Diagnosis not present

## 2023-09-29 DIAGNOSIS — M47816 Spondylosis without myelopathy or radiculopathy, lumbar region: Secondary | ICD-10-CM

## 2023-09-29 DIAGNOSIS — M5416 Radiculopathy, lumbar region: Secondary | ICD-10-CM | POA: Diagnosis not present

## 2023-09-29 MED ORDER — GADOPICLENOL 0.5 MMOL/ML IV SOLN
8.0000 mL | Freq: Once | INTRAVENOUS | Status: AC | PRN
  Administered 2023-09-29: 8 mL via INTRAVENOUS

## 2023-09-30 ENCOUNTER — Ambulatory Visit (INDEPENDENT_AMBULATORY_CARE_PROVIDER_SITE_OTHER): Payer: BLUE CROSS/BLUE SHIELD | Admitting: Licensed Clinical Social Worker

## 2023-09-30 DIAGNOSIS — F319 Bipolar disorder, unspecified: Secondary | ICD-10-CM | POA: Diagnosis not present

## 2023-10-02 ENCOUNTER — Telehealth (HOSPITAL_COMMUNITY): Payer: BLUE CROSS/BLUE SHIELD | Admitting: Psychiatry

## 2023-10-02 DIAGNOSIS — M47816 Spondylosis without myelopathy or radiculopathy, lumbar region: Secondary | ICD-10-CM | POA: Diagnosis not present

## 2023-10-03 ENCOUNTER — Encounter (HOSPITAL_COMMUNITY): Payer: Self-pay | Admitting: Psychiatry

## 2023-10-03 ENCOUNTER — Telehealth (HOSPITAL_COMMUNITY): Payer: BLUE CROSS/BLUE SHIELD | Admitting: Psychiatry

## 2023-10-03 VITALS — Wt 180.0 lb

## 2023-10-03 DIAGNOSIS — F5101 Primary insomnia: Secondary | ICD-10-CM | POA: Diagnosis not present

## 2023-10-03 DIAGNOSIS — F319 Bipolar disorder, unspecified: Secondary | ICD-10-CM

## 2023-10-03 DIAGNOSIS — F411 Generalized anxiety disorder: Secondary | ICD-10-CM

## 2023-10-03 MED ORDER — LAMOTRIGINE ER 300 MG PO TB24: 1.0000 | ORAL_TABLET | ORAL | 0 refills | Status: DC

## 2023-10-03 MED ORDER — ZOLPIDEM TARTRATE 10 MG PO TABS: ORAL_TABLET | ORAL | 0 refills | Status: DC

## 2023-10-03 MED ORDER — AMITRIPTYLINE HCL 10 MG PO TABS: 10.0000 mg | ORAL_TABLET | Freq: Every day | ORAL | 1 refills | Status: DC

## 2023-10-03 NOTE — Progress Notes (Signed)
BH MD/PA/NP OP Progress Note  Virtual Visit via Video Note  I connected with Alex [name] on 10/03/23 at 10:20 AM EST by a video enabled telemedicine application and verified that I am speaking with the correct person using two identifiers.  Location: Patient: Home Provider: Home Office   I discussed the limitations of evaluation and management by telemedicine and the availability of in person appointments. The patient expressed understanding and agreed to proceed.   10/03/2023 10:24 AM Alex [name]  MRN:  [id number]  Chief Complaint:  Chief Complaint  Patient presents with   Anxiety   Medication Refill   Follow-up   HPI: Patient is evaluated by video session.  He reported a lot of anxiety and nervousness because of his general physical health.  He recently had procedure in his shoulder and cyst was removed.  He also going to [facility] for his management of macular edema.  He is able to drive but very concerned about his vision.  His left eye is good but right eye has very limited vision.  He admitted still very irritable, upset and sometimes blow up with his wife after the argument but denies any mania or psychosis.  His biggest concern now is frequent awakening and not able to sleep all night.  He feels the Ambien helping his sleep in the beginning but he does not sleep all night.  He is taking Lamictal and denies any rash, itching, tremors or shakes.  In the past he has taken Cymbalta mirtazapine and BuSpar.  He had stopped taking all these medication because he do not feel it did not work.  He is in therapy with Lavada Mesi.  His appetite is fair.  He wants to keep the Ambien.  He denies any hallucination, paranoia, suicidal thoughts.    Visit Diagnosis:    ICD-10-CM   1. Bipolar I disorder (HCC)  F31.9 LamoTRIgine 300 MG TB24 24 hour tablet    2. GAD (generalized anxiety disorder)  F41.1 zolpidem (AMBIEN) 10 MG tablet    LamoTRIgine 300 MG TB24 24 hour tablet     amitriptyline (ELAVIL) 10 MG tablet    3. Primary insomnia  F51.01 amitriptyline (ELAVIL) 10 MG tablet      Past Psychiatric History: Reviewed H/O depression, mood swing, anger and Inpatient at [facility] due to suicidal thoughts but no attempt.  No h/o psychosis but h/o poor impulse control.  Tried Klonopin but did not work.  Took Seroquel, Cymbalta, BuSpar and Remeron (increase depression)    Past Medical History:  Past Medical History:  Diagnosis Date   Anemia    Anxiety    Atypical nevus 04/12/1997   dyplastic-left chest below nipple   Atypical nevus 01/18/2005   slight-mod-mid upper abd, slight-mod-right lateral chest-(WS), slight-mod-mid lower back (punch)   Atypical nevus 05/31/2005   dysplastic-central lowerback (exc), dysplastic- right abdomen (Exc)   Basal cell carcinoma 06/04/2016   back of neck   Bipolar I disorder (HCC)    Bleeding ulcer 2016   BPH (benign prostatic hypertrophy) with urinary obstruction    Cancer (HCC)    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 Burna Cash, oncology. Receives monthly IVIG 40 grams.   Depression    Epicondylitis    right elbow   GERD (gastroesophageal reflux disease)    Glaucoma BOTH EYES   RIGHT EYE RADIATION DAMAGE   Hearing loss    Bilateral   Hepatic cyst    Several, The lesion of concern in segment 6 of the liver has single large portal vein and hepatic vein branches extending to tt, in a pattern of enhancement which mirrors these  vascular structures. 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 9 CM X 4. 3 CM PER NOV 06-14-17  CHEST CTFOLOWED BY DR Jens Som YEARLY FOR   Positional vertigo    HX OF WITH SINUS INFECTIONS   Prostate cancer (HCC)    Follows w/ Dr. Maisie Fus Polascik @ [facility].   Radial head fracture    Right   Squamous cell carcinoma of skin 06/04/2016   in situ-crown of scalp   Squamous cell carcinoma of skin 04/22/2017   in situ-crown scalp (txpbx)   Thoracic aortic aneurysm (HCC) 06/14/2017   last CT 4.1 CM Mild   Tinnitus    CONSTANT   Ulnar nerve compression    right elbow   Unsteady gait    especially with stairs, depth perception off   Urinary hesitancy    Wears glasses     Past Surgical History:  Procedure Laterality Date   CARDIAC CATHETERIZATION  01-16-2006  DR Maisie Fus WALL   MILD CORONARY ATHEROSCLEROSIS/ MID TO DISTAL LAD 40% STENOSIS/ LVF 50-55%   CARPAL TUNNEL RELEASE Right 11/03/2017   Procedure: RIGHT HAND CARPAL TUNNEL RELEASE;  Surgeon: Bradly Bienenstock, MD;  Location: [facility];  Service: Orthopedics;  Laterality: Right;   CATARACT EXTRACTION  Right    COLONSCOPY  2017 LAST DONE   MULTIPLE   CYST EXCISION Right 09/04/2023   Procedure: EXCISION CYST RIGHT SHOULDER;  Surgeon: Darnell Level, MD;  Location: [facility] [place];  Service: General;  Laterality: Right;  LOCAL & MAC   ENDOSCOPY  LAST 2017   MULTIPLE DONE DILATION DONE ALSO   ESOPHAGOGASTRODUODENOSCOPY (EGD) WITH PROPOFOL N/A 02/24/2018   Procedure: ESOPHAGOGASTRODUODENOSCOPY (EGD) WITH PROPOFOL;  Surgeon: Carman Ching, MD;  Location: [facility];  Service: Endoscopy;  Laterality: N/A;   EXCISION RADIAL HEAD Right  11/03/2017   Procedure: RIGHT PROXIMAL RADIUS RADIAL HEAD RESECTION AND JOINT DEBRIDEMENT;  Surgeon: Bradly Bienenstock, MD;  Location: [facility] [place];  Service: Orthopedics;  Laterality: Right;   EXTRACORPOREAL SHOCK WAVE LITHOTRIPSY Right 11/20/2020   Procedure: EXTRACORPOREAL SHOCK WAVE LITHOTRIPSY (ESWL);  Surgeon: Sebastian Ache, MD;  Location: [facility];  Service: Urology;  Laterality: Right;  75 MINS   KNEE ARTHROSCOPY  05/01/2012   Procedure: ARTHROSCOPY KNEE;  Surgeon: Javier Docker, MD;  Location: [facility];  Service: Orthopedics;  Laterality: Left;  debridement and removal of loose body   LEFT ANKLE ARTHROSCOPY W/ DEBRIDEMENT  05/12/2007   LEFT HYDROCELECTOMY  03/29/2005   AND REPAIR LEFT INGUINAL HERNIA W/ MESH   MOHS SURGERY  2002   RIGHT ORBITAL CANCER   NASAL ENDOSCOPY  08/07/2005   RIGHT EPISTAXIS  / POST SEPTOPLASTY  (HX RIGHT ORBITAL CA & S/P RADIATION/ NECROSIS ANTERIOR END OF BOTH INFERIOR TURBINATES)   occuloplastic surgery  2002   PARS PLANA VITRECTOMY  11/06/2004   RIGHT EYE RADIATION RETINOPATHY W/ HEMORRHAGE   PROSTATE ABLATION N/A 06/2022   RADIAL HEAD ARTHROPLASTY Right 06/15/2018   Procedure: RIGHT ELBOW PROXIMAL RADIOULNAR JOINT DEBRIDEMENT AND ARTHROPLASTY;  Surgeon: Bradly Bienenstock, MD;  Location: [facility] [place];  Service: Orthopedics;  Laterality: Right;  BLOCK WITH  SEDATION   REPAIR UNDESENDED RIGHT TESTICLE / RIGHT INGUINAL HERNIA  AGE 22   RIGHT ANKLE ARTHROSCOPY W/ EXTENSIVE DEBRIDEMENT  04/05/2008   x2   RIGHT SHOULDER SURGERY  2006   RIGHT SUPRAOMOHYOID NECK DISSECTION   03/08/2003   ZONES 1,2,3;   SUBMANDIBULAR MASS / METASTATIC SQUAMOUS CELL CARCINOMA RIGHT NECK   SAVORY DILATION N/A 02/24/2018   Procedure: SAVORY DILATION;  Surgeon: Carman Ching, MD;  Location: [facility];  Service: Endoscopy;  Laterality: N/A;   SEPTOPLASTY  06/2005   SHOULDER ARTHROSCOPY Left    SHOULDER ARTHROSCOPY W/ SUBACROMIAL DECOMPRESSION AND DISTAL CLAVICLE EXCISION  10/09/2008   AND DEBRIDEMENT OF RIGHT SHOULDER IMPINGEMENT & AC JOINT ARTHRITIS   SPINE SURGERY  2016   l 3 TO l 4 PLATE AND SCREWS   TOTAL THYROIDECTOMY  11/03/2001   PAPILLARY THYROID CARCINOMA   TRANSTHORACIC ECHOCARDIOGRAM  07/2011   grade I diastolic dysfunction/ ef 55-60%   ULNAR NERVE TRANSPOSITION Right 04/28/2014   Procedure: RIGHT ELBOW ULNA NERVE RELEASE TRANSPOSTION AND MEDIAL EPICONDYLAR DEBRIDEMENT AND REPAIR;  Surgeon: Sharma Covert, MD;  Location: [facility];  Service: Orthopedics;  Laterality: Right;    Family History:  Family History  Problem Relation Age of Onset   Depression Sister    Rectal cancer Sister    Lung cancer Brother    Kidney disease Mother    Depression Mother    Stroke Father    COPD Father    Hypertension Father    Prostate cancer Father    Depression Daughter    Drug abuse Daughter    Psychiatric Illness Son     Social History:  Social History   Socioeconomic History   Marital status: Married    Spouse name: Not on file   Number of children: Not on file   Years of education: Not on file   Highest education level: Associate degree: occupational, Scientist, product/process development, or vocational program  Occupational History   Not on file  Tobacco Use   Smoking status: Never   Smokeless tobacco: Never  Vaping Use   Vaping status: Never Used   Substance and  Sexual Activity   Alcohol use: Yes    Alcohol/week: 0.0 standard drinks of alcohol    Comment: 1-2 glasses per month   Drug use: No   Sexual activity: Yes    Partners: Female    Birth control/protection: None  Other Topics Concern   Not on file  Social History Narrative   Originally from [place]. Moved to [place] in 1984. 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 No international travel other than [place]. Has a dog at home, poodle mix. His daughter who lives with him now has a dog. No mold exposure. No bird exposure. No hot tub exposure. Enjoys gardening.    Social Drivers of Corporate investment banker Strain: Low Risk  (05/09/2023)   Overall Financial Resource Strain (CARDIA)    Difficulty of Paying Living Expenses: Not hard at all  Food Insecurity: No Food Insecurity (05/09/2023)   Hunger Vital Sign    Worried About Running Out of Food in the Last Year: Never true    Ran Out of Food in the Last Year: Never true  Transportation Needs: No Transportation Needs (09/22/2023)   Received [facility]    In the past 12 months, has lack of transportation kept you from medical appointments or from getting medications?: No    Lack of Transportation (Non-Medical): No  Physical Activity: Insufficiently Active (05/09/2023)   Exercise Vital Sign    Days of Exercise per Week: 1 day    Minutes of Exercise per Session: 30 min  Stress: Stress Concern Present (05/09/2023)   [employer] of Occupational Health - Occupational Stress Questionnaire    Feeling of Stress : Rather much  Social Connections: Moderately Integrated (05/09/2023)   Social Connection and Isolation Panel [NHANES]    Frequency of Communication with Friends and Family: More than three times a week    Frequency of Social Gatherings with  Friends and Family: Once a week    Attends Religious Services: More than 4 times per year    Active Member of [employer] or Organizations: No    Attends Engineer, structural: Not on file    Marital Status: Married    Allergies: No Known Allergies  Metabolic Disorder Labs: Lab Results  Component Value Date   HGBA1C 5.5 09/29/2018   MPG 123 (H) 01/05/2012   No results found for: "PROLACTIN" Lab Results  Component Value Date   CHOL 186 08/26/2023   TRIG 263.0 (H) 08/26/2023   HDL 60.60 08/26/2023   CHOLHDL 3 08/26/2023   VLDL 52.6 (H) 08/26/2023   LDLCALC 72 08/26/2023   LDLCALC 74 05/12/2023   Lab Results  Component Value Date   TSH 0.70 08/26/2023   TSH 4.05 05/12/2023    Therapeutic Level Labs: No results found for: "LITHIUM" No results found for: "VALPROATE" No results found for: "CBMZ"  Current Medications: Current Outpatient Medications  Medication Sig Dispense Refill   acetaminophen (TYLENOL) 500 MG tablet Take by mouth.     albuterol (VENTOLIN HFA) 108 (90 Base) MCG/ACT inhaler TAKE 2 PUFFS BY MOUTH EVERY 6 HOURS AS NEEDED FOR WHEEZE OR SHORTNESS OF BREATH (Patient not taking: Reported on 09/04/2023) 6.7 each 5   amLODipine (NORVASC) 5 MG tablet Take 1 tablet (5 mg total) by mouth daily. 90 tablet 3   aspirin EC 81 MG  tablet Take 1 tablet by mouth daily.     atorvastatin (LIPITOR) 40 MG tablet TAKE 1 TABLET BY MOUTH EVERY DAY 90 tablet 1   AXIRON 30 MG/ACT SOLN Place 2 Act onto the skin daily.     brimonidine (ALPHAGAN) 0.2 % ophthalmic solution SMARTSIG:In Eye(s)     busPIRone (BUSPAR) 5 MG tablet TAKE 1 TABLET BY MOUTH EVERYDAY AT BEDTIME (Patient not taking: Reported on 08/27/2023) 30 tablet 1   Coenzyme Q10 (CO Q 10 PO) Take 1 capsule by mouth daily.      EPINEPHrine 0.3 mg/0.3 mL IJ SOAJ injection Inject 0.3 mg into the muscle as needed. (Patient not taking: Reported on 08/27/2023)     erythromycin ophthalmic ointment Place into the right eye at bedtime.      furosemide (LASIX) 20 MG tablet TAKE 1 TO 2 TABLETS BY MOUTH DAILY AS NEEDED (Patient not taking: Reported on 08/27/2023) 180 tablet 1   Immune Globulin, Human, 4 GM/20ML SOLN Inject into the skin once a week. wednesday     LamoTRIgine 300 MG TB24 24 hour tablet Take 1 tablet (300 mg total) by mouth every morning. 90 tablet 0   latanoprost (XALATAN) 0.005 % ophthalmic solution 1 DROP EACH EYE AT NIGHT     levothyroxine (SYNTHROID) 150 MCG tablet TAKE 1 TABLET BY MOUTH DAILY BEFORE BREAKFAST. 90 tablet 2   LORazepam (ATIVAN) 0.5 MG tablet Take one tab daily and 2nd if needed for anxiety (Patient not taking: Reported on 08/27/2023) 45 tablet 0   meloxicam (MOBIC) 15 MG tablet TAKE 1 TABLET BY MOUTH EVERY DAY AS NEEDED 90 tablet 0   Multiple Vitamin (MULTI-VITAMINS) TABS Take by mouth.     OMEGA-3 KRILL OIL PO Take 1 capsule by mouth daily.      omeprazole (PRILOSEC) 20 MG capsule Take 20 mg by mouth every evening.     sildenafil (REVATIO) 20 MG tablet Take 1 tablet (20 mg total) by mouth as needed. 30 tablet 1   sulfamethoxazole-trimethoprim (BACTRIM DS) 800-160 MG tablet Take 1 tablet by mouth 2 (two) times daily. 20 tablet 0   tadalafil (CIALIS) 5 MG tablet Take 5 mg by mouth.     tamsulosin (FLOMAX) 0.4 MG CAPS capsule TAKE 1 CAPSULE BY MOUTH EVERY DAY (Patient taking differently: as needed.) 90 capsule 1   telmisartan (MICARDIS) 80 MG tablet Take 1 tablet (80 mg total) by mouth daily. 90 tablet 2   timolol (TIMOPTIC) 0.5 % ophthalmic solution timolol maleate 0.5 % eye drops  INSTILL 1 DROP INTO RIGHT EYE TWICE A DAY     traMADol (ULTRAM) 50 MG tablet Take 50 mg by mouth as needed.     traMADol (ULTRAM) 50 MG tablet Take 1 tablet (50 mg total) by mouth every 6 (six) hours as needed for moderate pain (pain score 4-6). 12 tablet 0   triamcinolone ointment (KENALOG) 0.5 % Apply 1 Application topically 4 (four) times daily. On the lip 30 g 0   zolpidem (AMBIEN) 10 MG tablet TAKE 1/2 (ONE-HALF)  TABLET BY MOUTH AT BEDTIME AND  TAKE  THE  OTHER  HALF  IF  NEEDED 30 tablet 0   No current facility-administered medications for this visit.     Musculoskeletal: Strength & Muscle Tone: within normal limits Gait & Station: normal Patient leans: N/A  Psychiatric Specialty Exam: Review of Systems  Musculoskeletal:  Positive for back pain.       Knee pain  Psychiatric/Behavioral:  The patient is nervous/anxious.  Weight 180 lb (81.6 kg).There is no height or weight on file to calculate BMI.  General Appearance: Casual and redness in right eye  Eye Contact:  Fair  Speech:  Slow  Volume:  Normal  Mood:  Anxious and Dysphoric  Affect:  Constricted  Thought Process:  Goal Directed  Orientation:  Full (Time, Place, and Person)  Thought Content: Rumination   Suicidal Thoughts:  No  Homicidal Thoughts:  No  Memory:  Immediate;   Good Recent;   Fair Remote;   Fair  Judgement:  Intact  Insight:  Present  Psychomotor Activity:  Decreased  Concentration:  Concentration: Good and Attention Span: Good  Recall:  Good  Fund of Knowledge: Good  Language: Good  Akathisia:  No  Handed:  Right  AIMS (if indicated): not done  Assets:  Communication Skills Desire for Improvement Housing Transportation  ADL's:  Intact  Cognition: WNL  Sleep:  Fair   Screenings: GAD-7    Flowsheet Row Video Visit from 10/03/2023 in [facility] Counselor from 11/06/2020 in [facility] Visit from 08/01/2017 in [facility]  Total GAD-7 Score 8 18 6       PHQ2-9    Flowsheet Row Office Visit from 08/20/2023 in [facility] [place] Office Visit from 04/30/2023 in [facility] Office Visit from 03/31/2023 in [facility] Office Visit from 11/20/2022 in [facility] Visit from 08/22/2022 in [facility]  PHQ-2 Total Score 0 4 1 0 0  PHQ-9 Total Score -- 13 11 -- --      Flowsheet Row Admission (Discharged) from 09/04/2023 in [facility] ED from 04/11/2023 in [facility] Video [facility]  C-SSRS RISK CATEGORY No Risk No Risk No Risk        Assessment and Plan: Patient is 68 year old Caucasian man with multiple health issues including coronary artery sclerosis, hypertension, macular edema, chronic back pain and knee pain.  He is taking Lamictal and Ambien to help his sleep, mood symptoms.  He was taking Ativan in the past to help his anxiety but it was discontinued due to multiple controlled substance.  Like to go back on Ativan however I discussed considering a different medication to help his sleep, anxiety and chronic pain.  He had stopped BuSpar because he does not feel it helped.  He is little reluctant to try it SSRIs.  After some discussion he agreed to trial low-dose amitriptyline.  Since he like to keep the Ambien we will not discontinue and he will take 5 mg to 10 mg as needed for insomnia.  Will start the amitriptyline 10-20 mg at bedtime.  The other option is to consider going back to low-dose Cymbalta.  We have discussed in the past to take trazodone, doxepin, melatonin but patient like to avoid taking these medication.  Encouraged to continue therapy with Lavada Mesi.  Recommend to call us back if is any question or any concern.  Follow-up in 4 weeks.  Collaboration of Care: Collaboration of Care: Other provider involved in patient's care AEB notes are available in epic to review  Patient/Guardian was advised Release of Information must be obtained prior to any record release in order to collaborate their care with an outside provider. Patient/Guardian  was advised if they have not already done so to  contact the registration department to sign all necessary forms in order for [place] to release information regarding their care.   Consent: Patient/Guardian gives verbal consent for treatment and assignment of benefits for services provided during this visit. Patient/Guardian expressed understanding and agreed to proceed.    Follow Up Instructions:    I discussed the assessment and treatment plan with the patient. The patient was provided an opportunity to ask questions and all were answered. The patient agreed with the plan and demonstrated an understanding of the instructions.   The patient was advised to call back or seek an in-person evaluation if the symptoms worsen or if the condition fails to improve as anticipated.  I provided 29 minutes of non-face-to-face time during this encounter.  Cleotis Nipper, MD 10/03/2023, 10:24 AM

## 2023-10-05 ENCOUNTER — Encounter (HOSPITAL_COMMUNITY): Payer: Self-pay | Admitting: Licensed Clinical Social Worker

## 2023-10-05 NOTE — Progress Notes (Signed)
 Virtual virtual Video Note  I connected with Alex [name] on 09/23/2023 at 2-3pm EST by video-enabled virtual visit. I verified that I am speaking with the correct person using  two identifiers.I discussed the limitations of evaluation and management by telemedicine and the availability of in person appointments. The patient expressed understanding and agreed to proceed.    LOCATION: Patient: Home  Provider: Home office   History of Present Illness:  Pt was referred by Dr. Lolly Mustache for OP therapy for bipolar disorder and anxiety.  Treatment Goal Addressed:  Pt will meet with clinician 1x every 2 weeks for therapy to monitor for progress towards goals and address any barriers to success; Reduce depression from average severity level of 6/10 down to a 4/10 in next 90 days by engaging in 1-2 positive coping skills daily as part of developing self-care routine; Reduce average anxiety level from 7/10 down to 5/10 in next 90 days by utilizing 1-2 relaxation skills/grounding skills per day, such as mindful breathing, progressive muscle relaxation, positive visualizations.  Progress towards Treatment Goal: Progressing   Observations/Objective: Patient presented for today's session on time and was alert, oriented x5, with no evidence or self-report of SI/HI or A/V H.  Patient reported ongoing compliance with medication and denied any use of alcohol or illicit substances.  Clinician inquired about patient's current emotional ratings, as well as any significant changes in thoughts, feelings or behavior since previous session. Patient reported scores of  6/10 for depression, 6/10 for anxiety, 3/10 for anger/irritability. Pt explored his emotional ratings, and discussed coping skills for stressors. 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 Pt  continues seeing providers at [facility]. His wife has to drive him which sometimes leads to disagreements. [name] provided effective communication skills using "I statements."     Assessment and plan: Counselor will continue to meet with patient to address treatment plan goals. Patient will continue to follow recommendations of providers and imnd implement skills learned in session, and practice between sessions. Diagnosis: Bipolar 1 disorder.    Collaboration of care: Other:   Continue working with providers    Patient/Guardian was advised Release of Information must be obtained prior to any record release in order to collaborate their care with an outside provider. Patient/Guardian was advised if they have not already done so to contact the registration department to sign all necessary forms in order for [place] to release information regarding their care.    Consent: Patient/Guardian gives verbal consent for treatment and assignment of benefits for services provided during this visit. Patient/Guardian expressed understanding and agreed to proceed.     Follow Up Instructions:  I discussed the assessment and treatment plan with the patient. The patient was provided an opportunity to ask questions and all were answered. The patient agreed with the plan and demonstrated an understanding of the instructions.   The patient was advised to call back or seek an in-person evaluation if the symptoms worsen or if the condition fails to improve as anticipated.  I provided 60 minutes of non-face-to-face time during this encounter.   Rumor Sun S, LCAS  09/23/23

## 2023-10-06 ENCOUNTER — Encounter (HOSPITAL_COMMUNITY): Payer: Self-pay | Admitting: Licensed Clinical Social Worker

## 2023-10-07 ENCOUNTER — Ambulatory Visit (HOSPITAL_COMMUNITY): Payer: BLUE CROSS/BLUE SHIELD | Admitting: Licensed Clinical Social Worker

## 2023-10-07 NOTE — Progress Notes (Signed)
 Virtual virtual Video Note  I connected with Alex [name] on 09/30/2023 at 2-3pm EST by video-enabled virtual visit. I verified that I am speaking with the correct person using  two identifiers.I discussed the limitations of evaluation and management by telemedicine and the availability of in person appointments. The patient expressed understanding and agreed to proceed.    LOCATION: Patient: Home  Provider: Home office   History of Present Illness:  Pt was referred by Dr. Lolly Mustache for OP therapy for bipolar disorder and anxiety.  Treatment Goal Addressed:  Pt will meet with clinician 1x every 2 weeks for therapy to monitor for progress towards goals and address any barriers to success; Reduce depression from average severity level of 6/10 down to a 4/10 in next 90 days by engaging in 1-2 positive coping skills daily as part of developing self-care routine; Reduce average anxiety level from 7/10 down to 5/10 in next 90 days by utilizing 1-2 relaxation skills/grounding skills per day, such as mindful breathing, progressive muscle relaxation, positive visualizations.  Progress towards Treatment Goal: Progressing   Observations/Objective: Patient presented for today's session on time and was alert, oriented x5, with no evidence or self-report of SI/HI or A/V H.  Patient reported ongoing compliance with medication and denied any use of alcohol or illicit substances.  Clinician inquired about patient's current emotional ratings, as well as any significant changes in thoughts, feelings or behavior since previous session. Patient reported scores of  6/10 for depression, 6/10 for anxiety, 3/10 for anger/irritability. [name] ad pt explored his emotional ratings, and discussed coping skills for stressors. 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 [name] provided additional psychoeducation about effective communication skills which may assist with marital miscommunication.   Assessment and plan: Counselor will continue to meet with patient to address treatment plan goals. Patient will continue to follow recommendations of providers and imnd implement skills learned in session, and practice between sessions. Diagnosis: Bipolar 1 disorder.    Collaboration of care: Other:   Continue working with providers    Patient/Guardian was advised Release of Information must be obtained prior to any record release in order to collaborate their care with an outside provider. Patient/Guardian was advised if they have not already done so to contact the registration department to sign all necessary forms in order for [place] to release information regarding their care.    Consent: Patient/Guardian gives verbal consent for treatment and assignment of benefits for services provided during this visit. Patient/Guardian expressed understanding and agreed to proceed.     Follow Up Instructions:  I discussed the assessment and treatment plan with the patient. The patient was provided an opportunity to ask questions and all were answered. The patient agreed with the plan and demonstrated an understanding of the instructions.   The patient was advised to call back or seek an in-person evaluation if the symptoms worsen or if the condition fails to improve as anticipated.  I provided 60 minutes of non-face-to-face time during this encounter.   Stephanie Littman S, LCAS  09/30/23

## 2023-10-14 ENCOUNTER — Ambulatory Visit (INDEPENDENT_AMBULATORY_CARE_PROVIDER_SITE_OTHER): Payer: BLUE CROSS/BLUE SHIELD | Admitting: Licensed Clinical Social Worker

## 2023-10-14 ENCOUNTER — Inpatient Hospital Stay: Admission: RE | Admit: 2023-10-14 | Payer: BLUE CROSS/BLUE SHIELD | Source: Ambulatory Visit

## 2023-10-14 ENCOUNTER — Encounter (HOSPITAL_COMMUNITY): Payer: Self-pay | Admitting: Licensed Clinical Social Worker

## 2023-10-14 DIAGNOSIS — M170 Bilateral primary osteoarthritis of knee: Secondary | ICD-10-CM | POA: Diagnosis not present

## 2023-10-14 DIAGNOSIS — F319 Bipolar disorder, unspecified: Secondary | ICD-10-CM

## 2023-10-14 NOTE — Progress Notes (Signed)
 Virtual virtual Video Note  I connected with Alex [name] on 10/14/2023 at 2-3pm EST by video-enabled virtual visit. I verified that I am speaking with the correct person using  two identifiers.I discussed the limitations of evaluation and management by telemedicine and the availability of in person appointments. The patient expressed understanding and agreed to proceed.    LOCATION: Patient: Home  Provider: Home office   History of Present Illness:  Pt was referred by Dr. Lolly Mustache for OP therapy for bipolar disorder and anxiety.  Treatment Goal Addressed:  Pt will meet with clinician 1x every 2 weeks for therapy to monitor for progress towards goals and address any barriers to success; Reduce depression from average severity level of 6/10 down to a 4/10 in next 90 days by engaging in 1-2 positive coping skills daily as part of developing self-care routine; Reduce average anxiety level from 7/10 down to 5/10 in next 90 days by utilizing 1-2 relaxation skills/grounding skills per day, such as mindful breathing, progressive muscle relaxation, positive visualizations.  Progress towards Treatment Goal: Progressing   Observations/Objective: Patient presented for today's session on time and was alert, oriented x5, with no evidence or self-report of SI/HI or A/V H.  Patient reported ongoing compliance with medication and denied any use of alcohol or illicit substances.  Clinician inquired about patient's current emotional ratings, as well as any significant changes in thoughts, feelings or behavior since previous session. Patient reported scores of  8/10 for depression, 8/10 for anxiety, 3/10 for anger/irritability. [name] and pt explored his emotional ratings, and discussed coping skills for stressors. 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 Clinician utilized CBT to process concerns, worries   and challenges. Clinician processed thoughts, feelings, and behaviors. Clinician discussed coping skills and options for supporting himself through these challenging times.    Assessment and plan: Counselor will continue to meet with patient to address treatment plan goals. Patient will continue to follow recommendations of providers and imnd implement skills learned in session, and practice between sessions. Diagnosis: Bipolar 1 disorder.    Collaboration of care: Other:   Continue working with providers    Patient/Guardian was advised Release of Information must be obtained prior to any record release in order to collaborate their care with an outside provider. Patient/Guardian was advised if they have not already done so to contact the registration department to sign all necessary forms in order for [place] to release information regarding their care.    Consent: Patient/Guardian gives verbal consent for treatment and assignment of benefits for services provided during this visit. Patient/Guardian expressed understanding and agreed to proceed.     Follow Up Instructions:  I discussed the assessment and treatment plan with the patient. The patient was provided an opportunity to ask questions and all were answered. The patient agreed with the plan and demonstrated an understanding of the instructions.   The patient was advised to call back or seek an in-person evaluation if the symptoms worsen or if the condition fails to improve as anticipated.  I provided 60 minutes of non-face-to-face time during this encounter.   Dasie Chancellor S, LCAS  10/14/23

## 2023-10-16 NOTE — Therapy (Signed)
 OUTPATIENT PHYSICAL THERAPY LOWER EXTREMITY EVALUATION   Patient Name: Alex [name] MRN: [id number] DOB:05-09-1956, 68 y.o., male Today's Date: 10/17/2023  END OF SESSION:  PT End of Session - 10/17/23 1432     Visit Number 1    Number of Visits 17    Date for PT Re-Evaluation 12/12/23    Authorization Type BCBS    PT Start Time 1432    PT Stop Time 1515    PT Time Calculation (min) 43 min    Activity Tolerance Patient tolerated treatment well             Past Medical History:  Diagnosis Date   Anemia    Anxiety    Atypical nevus 04/12/1997   dyplastic-left chest below nipple   Atypical nevus 01/18/2005   slight-mod-mid upper abd, slight-mod-right lateral chest-(WS), slight-mod-mid lower back (punch)   Atypical nevus 05/31/2005   dysplastic-central lowerback (exc), dysplastic- right abdomen (Exc)   Basal cell carcinoma 06/04/2016   back of neck   Bipolar I disorder (HCC)    Bleeding ulcer 2016   BPH (benign prostatic hypertrophy) with urinary obstruction    Cancer (HCC)    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 Burna Cash, oncology. Receives monthly IVIG 40 grams.   Depression    Epicondylitis    right elbow   GERD (gastroesophageal reflux disease)    Glaucoma BOTH EYES   RIGHT EYE RADIATION DAMAGE   Hearing loss    Bilateral   Hepatic cyst    Several, The lesion of concern in segment 6 of the liver has single large portal vein and hepatic vein branches  extending to tt, in a pattern of enhancement which mirrors these vascular structures. 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 9 CM X 4. 3 CM PER NOV 06-14-17  CHEST CTFOLOWED BY DR CRENSHAW YEARLY FOR   Positional vertigo    HX OF WITH SINUS INFECTIONS  Prostate cancer [facility])    Follows w/ Dr. Maisie Fus Polascik @ [facility].   Radial head fracture    Right   Squamous cell carcinoma of skin 06/04/2016   in situ-crown of scalp   Squamous cell carcinoma of skin 04/22/2017   in situ-crown scalp (txpbx)   Thoracic aortic aneurysm (HCC) 06/14/2017   last CT 4.1 CM Mild   Tinnitus    CONSTANT   Ulnar nerve compression    right elbow   Unsteady gait    especially with stairs, depth perception off   Urinary hesitancy    Wears glasses    Past Surgical History:  Procedure Laterality Date   CARDIAC CATHETERIZATION  01-16-2006  DR Maisie Fus WALL   MILD CORONARY ATHEROSCLEROSIS/ MID TO DISTAL LAD 40% STENOSIS/ LVF 50-55%   CARPAL TUNNEL RELEASE Right 11/03/2017   Procedure: RIGHT HAND CARPAL TUNNEL RELEASE;  Surgeon: Bradly Bienenstock, MD;  Location: [facility] [place];   Service: Orthopedics;  Laterality: Right;   CATARACT EXTRACTION Right    COLONSCOPY  2017 LAST DONE   MULTIPLE   CYST EXCISION Right 09/04/2023   Procedure: EXCISION CYST RIGHT SHOULDER;  Surgeon: Darnell Level, MD;  Location: [facility] [place];  Service: General;  Laterality: Right;  LOCAL & MAC   ENDOSCOPY  LAST 2017   MULTIPLE DONE DILATION DONE ALSO   ESOPHAGOGASTRODUODENOSCOPY (EGD) WITH PROPOFOL N/A 02/24/2018   Procedure: ESOPHAGOGASTRODUODENOSCOPY (EGD) WITH PROPOFOL;  Surgeon: Carman Ching, MD;  Location: [facility];  Service: Endoscopy;  Laterality: N/A;   EXCISION RADIAL HEAD Right 11/03/2017   Procedure: RIGHT PROXIMAL RADIUS RADIAL HEAD RESECTION AND JOINT DEBRIDEMENT;  Surgeon: Bradly Bienenstock, MD;  Location: [facility];  Service: Orthopedics;  Laterality: Right;   EXTRACORPOREAL SHOCK WAVE LITHOTRIPSY Right 11/20/2020   Procedure: EXTRACORPOREAL SHOCK WAVE LITHOTRIPSY (ESWL);  Surgeon: Sebastian Ache, MD;  Location: [facility];  Service: Urology;  Laterality: Right;  75 MINS   KNEE ARTHROSCOPY  05/01/2012   Procedure: ARTHROSCOPY KNEE;  Surgeon: Javier Docker, MD;  Location: [facility];  Service: Orthopedics;  Laterality: Left;  debridement and removal of loose body   LEFT ANKLE ARTHROSCOPY W/ DEBRIDEMENT  05/12/2007   LEFT HYDROCELECTOMY  03/29/2005   AND REPAIR LEFT INGUINAL HERNIA W/ MESH   MOHS SURGERY  2002   RIGHT ORBITAL CANCER   NASAL ENDOSCOPY  08/07/2005   RIGHT EPISTAXIS  / POST SEPTOPLASTY  (HX RIGHT ORBITAL CA & S/P RADIATION/ NECROSIS ANTERIOR END OF BOTH INFERIOR TURBINATES)   occuloplastic surgery  2002   PARS PLANA VITRECTOMY  11/06/2004   RIGHT EYE RADIATION RETINOPATHY W/ HEMORRHAGE   PROSTATE ABLATION N/A 06/2022   RADIAL HEAD ARTHROPLASTY Right 06/15/2018   Procedure: RIGHT ELBOW PROXIMAL RADIOULNAR JOINT DEBRIDEMENT AND ARTHROPLASTY;  Surgeon: Bradly Bienenstock, MD;  Location: [facility];  Service: Orthopedics;  Laterality: Right;  BLOCK WITH SEDATION   REPAIR UNDESENDED RIGHT TESTICLE / RIGHT INGUINAL HERNIA  AGE 83   RIGHT ANKLE ARTHROSCOPY W/ EXTENSIVE DEBRIDEMENT  04/05/2008   x2   RIGHT SHOULDER SURGERY  2006   RIGHT SUPRAOMOHYOID NECK DISSECTION   03/08/2003   ZONES 1,2,3;   SUBMANDIBULAR MASS / METASTATIC SQUAMOUS CELL CARCINOMA RIGHT NECK   SAVORY DILATION N/A 02/24/2018   Procedure: SAVORY DILATION;  Surgeon: Carman Ching, MD;  Location: [facility];  Service: Endoscopy;  Laterality: N/A;   SEPTOPLASTY  06/2005   SHOULDER ARTHROSCOPY Left    SHOULDER ARTHROSCOPY  W/ SUBACROMIAL DECOMPRESSION AND DISTAL CLAVICLE EXCISION  10/09/2008   AND DEBRIDEMENT OF RIGHT SHOULDER IMPINGEMENT & AC JOINT ARTHRITIS   SPINE SURGERY  2016   l 3 TO l 4 PLATE AND SCREWS   TOTAL THYROIDECTOMY  11/03/2001   PAPILLARY THYROID CARCINOMA   TRANSTHORACIC ECHOCARDIOGRAM  07/2011   grade I diastolic dysfunction/ ef 55-60%   ULNAR NERVE TRANSPOSITION Right 04/28/2014   Procedure: RIGHT ELBOW ULNA NERVE RELEASE TRANSPOSTION AND MEDIAL EPICONDYLAR DEBRIDEMENT AND REPAIR;  Surgeon: Sharma Covert, MD;  Location: [facility];  Service: Orthopedics;  Laterality: Right;   Patient Active Problem List   Diagnosis Date Noted   Boil 08/26/2023   Abscess 08/20/2023   Cellulitis 05/12/2023   Bruise of lower lip 04/17/2023   Wheezing 03/31/2023   Injury of medial collateral ligament (MCL) of knee 02/07/2023   Osteoarthritis of subtalar joint 09/25/2022   Cystoid macular edema of right eye 07/18/2022   Hyperglycemia 07/18/2022   Nonexudative age-related macular degeneration, left eye, early dry stage 07/18/2022   Retinal edema 07/18/2022   Superficial punctate keratitis of right eye 07/18/2022   Grief at loss of child 05/22/2022   Actinic keratoses 05/22/2022   Pain in joint of left elbow 04/04/2022   Abnormal defecation 02/26/2022   Atrophic gastritis 02/26/2022    Bipolar 1 disorder (HCC) 02/26/2022   BPH (benign prostatic hyperplasia) 02/26/2022   Bronchiolectasis (HCC) 02/26/2022   Disease of thyroid gland 02/26/2022   Diverticular disease of colon 02/26/2022   Family history of malignant neoplasm of digestive organs 02/26/2022   Hyperlipidemia 02/26/2022   IgG deficiency (HCC) 02/26/2022   Immune deficiency disorder (HCC) 02/26/2022   Irritable bowel syndrome 02/26/2022   Pancreatic cyst 02/26/2022   History of colonic polyps 02/26/2022   Portosystemic shunt, 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/26/2022   Pyloric ulcer 02/26/2022   Scoliosis 02/26/2022   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   Spinal stenosis of lumbar region 02/26/2022   Prostate cancer (HCC) 02/26/2022   Dysuria 10/03/2021   Earache 06/14/2021   Anemia 06/14/2021   History of head and neck cancer 03/28/2021   Swelling of right parotid gland 03/28/2021   Epistaxis, recurrent 06/26/2020   PMR (polymyalgia rheumatica) (HCC) 02/22/2020   Diverticulitis 01/25/2020   Abdominal pain 01/25/2020   Iliac vessel injury 11/30/2019   Stress at home 09/14/2019   LLQ abdominal pain 09/06/2019   IPMN (intraductal papillary mucinous neoplasm) 07/19/2019   History of thyroid cancer 06/03/2019   Abnormal findings on diagnostic imaging of liver 06/03/2019   Epididymitis 03/16/2019   Difficulty urinating 03/16/2019   Pain in right foot 03/12/2019   Hypothyroidism (acquired) 09/29/2018   Moderate persistent asthma with acute exacerbation 09/29/2018   Prediabetes 09/29/2018   RTI (respiratory tract infection) 09/29/2018   ETD (Eustachian tube dysfunction), bilateral 09/02/2018   Sensorineural hearing loss (SNHL) of both ears 07/31/2018   Bruising 11/17/2017   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   Closed fracture of head of right radius 10/24/2017   Palatal mass 09/16/2017   Sore throat 09/16/2017   Testicular pain, right 08/01/2017   Combined  form of age-related cataract, left eye 05/19/2017   Dry eye syndrome of both eyes 05/19/2017   Primary open angle glaucoma (POAG) of left eye, mild stage 05/19/2017   Radiation retinopathy, 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   Secondary glaucoma due to combination mechanisms, right, severe stage 05/19/2017   Fatigue 01/21/2017   Ankle pain, left 01/21/2017   Fall (  on) (from) other stairs and steps, initial encounter 11/11/2016   Abrasion of head 11/11/2016   Ascending aortic aneurysm (HCC) 07/23/2016   Erectile dysfunction 07/19/2016   Bronchiectasis (HCC) 04/23/2016   Chronic bronchitis (HCC) 03/11/2016   Cerumen impaction 08/04/2015   Bilateral impacted cerumen 08/04/2015   Cervical disc disorder with radiculopathy of cervical region 06/06/2014   Elevated WBC count 06/06/2014   Iron deficiency anemia 06/06/2014   Edema 02/21/2014   Tinnitus 02/21/2014   Well adult exam 05/31/2013   Glaucoma 05/31/2013   RML pneumonia 03/31/2013   Hypertension, uncontrolled 02/25/2013   Anxiety disorder 08/08/2010   Chest pain, atypical 02/08/2010   ESOPHAGEAL STRICTURE 11/30/2009   Osteoarthritis 08/07/2009   Malaise and fatigue 05/14/2008   Coronary atherosclerosis 11/04/2007   Lumbago 11/04/2007   THYROIDECTOMY, HX OF 11/04/2007   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    PCP: Tresa Garter, MD  REFERRING PROVIDER: Guillermina City, PA-C  REFERRING DIAG: [phone] (ICD-10-CM) - Right knee pain M25.562 (ICD-10-CM) - Left knee pain R60.0 Bilateral edema of lower extremity  THERAPY DIAG:  Chronic pain of both knees  Other abnormalities of gait and mobility  Muscle weakness (generalized)  Rationale for Evaluation and Treatment: Rehabilitation  ONSET DATE: over a year ago  SUBJECTIVE:   SUBJECTIVE STATEMENT: Pt states he has BIL knee pain (L>R) for over a year at this point, first noticed difficulty with floor transfers and then worsened to the point where the majority of  transfers bothered knees to some extent. Particularly bothersome with stairs. He denies any issues with swelling or N/T. He does endorse some chronic ankle swelling bilaterally that he is in communication w/ providers about. We also discuss his complex medical history in depth today, particularly in regard to his back for which he states he will likely be receiving an epidural at some point for. He does mention that he received BIL knee injections ~3 days ago with about 50% relief.   PERTINENT HISTORY: anemia, anxiety, bipolar 1, BPH, basal cell carcinoma, other hx of cancer, CVID, depression, GERD, HTN, PVD, vertigo, thoracic aortic aneurysm, hx thyroidectomy, hx lumbar fusion  PAIN:  Are you having pain: none Location/description: BIL knees, anterior knee  Best-worst over past week: 0-8/10  - aggravating factors: stair navigation, kneeling, floor transfers, transfers from lower surfaces  - Easing factors: injections, positional changes    PRECAUTIONS: cancer history, aortic aneurysm (no isometrics)  WEIGHT BEARING RESTRICTIONS: No  FALLS:  Has patient fallen in last 6 months? 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 Does have some limitations due to back issues  PATIENT GOALS: would like to be more active  NEXT MD VISIT: TBD, plans to follow with Dr. Allena Katz beginning of April   OBJECTIVE:  Note: Objective measures were completed at Evaluation unless otherwise noted.  DIAGNOSTIC FINDINGS:  Recent MRI (09/29/23) for lumbar spine, no recent LE imaging in chart - pt states he had XR done through [facility], showed arthritis and edema in both knees, states they didn't feel he needed an MRI   PATIENT SURVEYS:  LEFS  38/80  COGNITION: Overall cognitive status: Within functional limits for tasks assessed     SENSATION: Does not endorse any sensory complaints  today  EDEMA:  Denies issues with knee edema     POSTURE: fwd head, rounded shoulders  PALPATION: Concordant tenderness anterior knees BIL, along joint line and patellar tendons. R is more tender to the touch than the L although the L is reportedly more symptomatic   LOWER EXTREMITY ROM:      Right eval Left eval  Hip flexion    Hip extension    Hip internal rotation    Hip external rotation    Knee extension full Full (passively, does have mild limitation actively against gravity)   Knee flexion 130 deg * 110 deg *  (Blank rows = not tested) (Key: WFL = within functional limits not formally assessed, * = concordant pain, s = stiffness/stretching sensation, NT = not tested)  Comments:    LOWER EXTREMITY MMT:    MMT Right eval Left eval  Hip flexion 4 4-  Hip abduction (modified sitting) 4 4  Hip internal rotation    Hip external rotation    Knee flexion 4+ 4  Knee extension 4+ 4  Ankle dorsiflexion 4 4   (Blank rows = not tested) (Key: WFL = within functional limits not formally assessed, * = concordant pain, s = stiffness/stretching sensation, NT = not tested)  Comments:     FUNCTIONAL TESTS:  5xSTS: 12.7sec no UE support, crepitus BIL, L knee pain  GAIT: Distance walked: within clinic Assistive device utilized: None Level of assistance: Complete Independence Comments: widened BOS, reduced truncal rotation, reduced knee ROM BIL throughout all phases of gait but more prominent on L                                                                                                                                 TREATMENT DATE:  [facility] Adult PT Treatment:                                                DATE: 10/17/23 Therapeutic Exercise: Seated quad set x10 Standing knee flexion AROM HEP handout + education, relevant  anatomy/physiology and rationale for interventions   PATIENT EDUCATION:  Education details: Pt education on PT impairments, prognosis, and POC. Informed consent. Rationale for interventions, safe/appropriate HEP performance Person educated: Patient Education method: Explanation, Demonstration, Tactile cues, Verbal cues Education comprehension: verbalized understanding, returned demonstration, verbal cues required, tactile cues required, and needs further education    HOME EXERCISE PROGRAM: Access Code: [email or web address] URL: [email or web address] Date: 10/17/2023 Prepared by: Fransisco Hertz  Exercises - Seated Quad Set  - 2-3 x daily - 1 sets - 10 reps - Standing Knee Flexion  - 2-3 x daily - 1 sets - 10 reps  ASSESSMENT:  CLINICAL IMPRESSION: Patient is a pleasant 68 y.o. gentleman who was seen today for physical therapy evaluation and treatment for bilateral knee  pain ongoing over past year, much improved with recent injections. Primarily limited in closed chain activities involving knee flexion such as transfers and stair navigation. On exam he demonstrates concordant limitations in knee mobility and strength, L more affected than R. His 5xSTS time is right at cutoff score for fall risk in majority of populations (12-14sec, varying) and elicits mild knee pain. No adverse events, tolerates exam/HEP well without increase in resting pain. Recommend trial of skilled PT to address aforementioned deficits with aim of improving functional tolerance and reducing pain with typical activities. Pt departs today's session in no acute distress, all voiced concerns/questions addressed appropriately from PT perspective.       OBJECTIVE IMPAIRMENTS: Abnormal gait, decreased activity tolerance, decreased endurance, decreased mobility, difficulty walking, decreased ROM, decreased strength, postural dysfunction, and pain.   ACTIVITY LIMITATIONS: standing, squatting, stairs, transfers, and locomotion  level  PARTICIPATION LIMITATIONS: meal prep, cleaning, laundry, and community activity  PERSONAL FACTORS: Age, Time since onset of injury/illness/exacerbation, and 3+ comorbidities: anemia, anxiety, bipolar 1, BPH, basal cell carcinoma, other hx of cancer, CVID, depression, GERD, HTN, PVD, vertigo, thoracic aortic aneurysm, hx thyroidectomy, hx lumbar fusion  are also affecting patient's functional outcome.   REHAB POTENTIAL: Fair given chronicity and comorbidities  CLINICAL DECISION MAKING: Evolving/moderate complexity  EVALUATION COMPLEXITY: Moderate   GOALS:   SHORT TERM GOALS: Target date: 11/14/2023  Pt will demonstrate appropriate understanding and performance of initially prescribed HEP in order to facilitate improved independence with management of symptoms.  Baseline: HEP established  Goal status: INITIAL   2. Pt will report at least 25% improvement in overall pain levels over past week in order to facilitate improved tolerance to typical daily activities.   Baseline: 0-8/10  Goal status: INITIAL    LONG TERM GOALS: Target date: 12/12/2023  Pt will score 50/80 or greater on LEFS in order to demonstrate improved perception of function due to symptoms (MCID 9 pts) Baseline: 38/80 Goal status: INITIAL  2.  Pt will demonstrate at least 120 degrees of knee flexion AROM bilaterally in order to facilitate improved tolerance to gait/transfers.  Baseline: see ROM chart above Goal status: INITIAL  3.  Pt will report at least 50% decrease in overall pain levels in past week in order to facilitate improved tolerance to basic ADLs/mobility.   Baseline: 0-8/10  Goal status: INITIAL    4.  Pt will be able to perform 5xSTS in less than or equal to 10 in order to demonstrate reduced fall risk and improved functional independence (MCID 5xSTS = 2.3 sec). Baseline: 12sec no UE support Goal status: INITIAL   5. Pt will report/demonstrate ability to navigate at least 13 steps w/o UE  support and less than 3 pt increase in pain in order to facilitate improved tolerance to home navigation  Baseline: pain with stair navigation  Goal status: INITIAL   PLAN:  PT FREQUENCY: 1-2x/week  PT DURATION: 8 weeks  PLANNED INTERVENTIONS: 97164- PT Re-evaluation, 97110-Therapeutic exercises, 97530- Therapeutic activity, 97112- Neuromuscular re-education, 97535- Self Care, 16109- Manual therapy, [id number]- Gait training, [phone]- Aquatic Therapy, Patient/Family education, Balance training, Stair training, Taping, Dry Needling, Joint mobilization, Cryotherapy, and Moist heat  PLAN FOR NEXT SESSION: Review/update HEP PRN. Work on [employer] exercises as appropriate with emphasis on quad activation, knee flexion mobility (especially on L), closed chain stability and functional mechanics. Symptom modification strategies as indicated/appropriate. Mindful of complex medical history including aortic aneurysm, cancer.   Ashley Murrain PT, DPT 10/17/2023 4:54 PM

## 2023-10-17 ENCOUNTER — Encounter (HOSPITAL_BASED_OUTPATIENT_CLINIC_OR_DEPARTMENT_OTHER): Payer: Self-pay | Admitting: Physical Therapy

## 2023-10-17 ENCOUNTER — Other Ambulatory Visit: Payer: Self-pay

## 2023-10-17 ENCOUNTER — Ambulatory Visit (HOSPITAL_BASED_OUTPATIENT_CLINIC_OR_DEPARTMENT_OTHER): Payer: BLUE CROSS/BLUE SHIELD | Attending: Physician Assistant | Admitting: Physical Therapy

## 2023-10-17 DIAGNOSIS — M25562 Pain in left knee: Secondary | ICD-10-CM | POA: Diagnosis not present

## 2023-10-17 DIAGNOSIS — R600 Localized edema: Secondary | ICD-10-CM | POA: Insufficient documentation

## 2023-10-17 DIAGNOSIS — G8929 Other chronic pain: Secondary | ICD-10-CM | POA: Diagnosis not present

## 2023-10-17 DIAGNOSIS — M6281 Muscle weakness (generalized): Secondary | ICD-10-CM

## 2023-10-17 DIAGNOSIS — M25561 Pain in right knee: Secondary | ICD-10-CM | POA: Diagnosis not present

## 2023-10-17 DIAGNOSIS — R2689 Other abnormalities of gait and mobility: Secondary | ICD-10-CM | POA: Diagnosis not present

## 2023-10-20 DIAGNOSIS — T66XXXS Radiation sickness, unspecified, sequela: Secondary | ICD-10-CM | POA: Diagnosis not present

## 2023-10-20 DIAGNOSIS — H3589 Other specified retinal disorders: Secondary | ICD-10-CM | POA: Diagnosis not present

## 2023-10-21 ENCOUNTER — Ambulatory Visit (HOSPITAL_COMMUNITY): Payer: BLUE CROSS/BLUE SHIELD | Admitting: Licensed Clinical Social Worker

## 2023-10-21 ENCOUNTER — Other Ambulatory Visit: Payer: Self-pay | Admitting: Cardiology

## 2023-10-21 DIAGNOSIS — E7800 Pure hypercholesterolemia, unspecified: Secondary | ICD-10-CM

## 2023-10-21 DIAGNOSIS — I2510 Atherosclerotic heart disease of native coronary artery without angina pectoris: Secondary | ICD-10-CM

## 2023-10-23 ENCOUNTER — Encounter (HOSPITAL_COMMUNITY): Payer: Self-pay

## 2023-10-23 ENCOUNTER — Ambulatory Visit: Admitting: Internal Medicine

## 2023-10-23 ENCOUNTER — Ambulatory Visit (HOSPITAL_COMMUNITY): Admitting: Licensed Clinical Social Worker

## 2023-10-23 ENCOUNTER — Encounter: Payer: Self-pay | Admitting: Internal Medicine

## 2023-10-23 VITALS — BP 120/70 | HR 84 | Temp 98.7°F | Ht 72.0 in | Wt 182.0 lb

## 2023-10-23 DIAGNOSIS — I2510 Atherosclerotic heart disease of native coronary artery without angina pectoris: Secondary | ICD-10-CM

## 2023-10-23 DIAGNOSIS — R600 Localized edema: Secondary | ICD-10-CM | POA: Diagnosis not present

## 2023-10-23 DIAGNOSIS — F419 Anxiety disorder, unspecified: Secondary | ICD-10-CM | POA: Diagnosis not present

## 2023-10-23 NOTE — Assessment & Plan Note (Signed)
 ASA, amlodipine, krill oil, Lipitor

## 2023-10-23 NOTE — Assessment & Plan Note (Signed)
 L>R LLE venous insufficiency per Ortho - he was told to see a vascular specialist Compression socks

## 2023-10-23 NOTE — Assessment & Plan Note (Signed)
Ongoing stress

## 2023-10-23 NOTE — Progress Notes (Signed)
 Subjective:  Patient ID: Alex [name], male    DOB: 1955-09-20  Age: 68 y.o. MRN: [id number]  CC: Referral and Back Pain (Pt is needing a referral to a local Vein and Vascular office)   HPI Alex [name] presents for a  problem w/LLE venous insufficiency per Ortho - he was told to see avascular specialist   Outpatient Medications Prior to Visit  Medication Sig Dispense Refill   acetaminophen (TYLENOL) 500 MG tablet Take by mouth.     amitriptyline (ELAVIL) 10 MG tablet Take 1-2 tablets (10-20 mg total) by mouth at bedtime. 40 tablet 1   amLODipine (NORVASC) 5 MG tablet Take 1 tablet (5 mg total) by mouth daily. 90 tablet 3   aspirin EC 81 MG tablet Take 1 tablet by mouth daily.     atorvastatin (LIPITOR) 40 MG tablet TAKE 1 TABLET BY MOUTH EVERY DAY 90 tablet 1   AXIRON 30 MG/ACT SOLN Place 2 Act onto the skin daily.     brimonidine (ALPHAGAN) 0.2 % ophthalmic solution SMARTSIG:In Eye(s)     Coenzyme Q10 (CO Q 10 PO) Take 1 capsule by mouth daily.      erythromycin ophthalmic ointment Place into the right eye at bedtime.     Immune Globulin, Human, 4 GM/20ML SOLN Inject into the skin once a week. wednesday     LamoTRIgine 300 MG TB24 24 hour tablet Take 1 tablet (300 mg total) by mouth every morning. 90 tablet 0   latanoprost (XALATAN) 0.005 % ophthalmic solution 1 DROP EACH EYE AT NIGHT     levothyroxine (SYNTHROID) 150 MCG tablet TAKE 1 TABLET BY MOUTH DAILY BEFORE BREAKFAST. 90 tablet 2   meloxicam (MOBIC) 15 MG tablet TAKE 1 TABLET BY MOUTH EVERY DAY AS NEEDED 90 tablet 0   Multiple Vitamin (MULTI-VITAMINS) TABS Take by mouth.     OMEGA-3 KRILL OIL PO Take 1 capsule by mouth daily.      omeprazole (PRILOSEC) 20 MG capsule Take 20 mg by mouth every evening.     sildenafil (REVATIO) 20 MG tablet Take 1 tablet (20 mg total) by mouth as needed. 30 tablet 1   sulfamethoxazole-trimethoprim (BACTRIM DS) 800-160 MG tablet Take 1 tablet by mouth 2 (two) times daily. 20 tablet 0    tadalafil (CIALIS) 5 MG tablet Take 5 mg by mouth.     tamsulosin (FLOMAX) 0.4 MG CAPS capsule TAKE 1 CAPSULE BY MOUTH EVERY DAY (Patient taking differently: as needed.) 90 capsule 1   telmisartan (MICARDIS) 80 MG tablet Take 1 tablet (80 mg total) by mouth daily. 90 tablet 2   timolol (TIMOPTIC) 0.5 % ophthalmic solution timolol maleate 0.5 % eye drops  INSTILL 1 DROP INTO RIGHT EYE TWICE A DAY     traMADol (ULTRAM) 50 MG tablet Take 50 mg by mouth as needed.     traMADol (ULTRAM) 50 MG tablet Take 1 tablet (50 mg total) by mouth every 6 (six) hours as needed for moderate pain (pain score 4-6). 12 tablet 0   triamcinolone ointment (KENALOG) 0.5 % Apply 1 Application topically 4 (four) times daily. On the lip 30 g 0   zolpidem (AMBIEN) 10 MG tablet TAKE 1/2 (ONE-HALF) TABLET BY MOUTH AT BEDTIME AND  TAKE  THE  OTHER  HALF  IF  NEEDED 30 tablet 0   albuterol (VENTOLIN HFA) 108 (90 Base) MCG/ACT inhaler TAKE 2 PUFFS BY MOUTH EVERY 6 HOURS AS NEEDED FOR WHEEZE OR SHORTNESS OF BREATH (Patient not taking:  Reported on 10/23/2023) 6.7 each 5   EPINEPHrine 0.3 mg/0.3 mL IJ SOAJ injection Inject 0.3 mg into the muscle as needed. (Patient not taking: Reported on 10/23/2023)     furosemide (LASIX) 20 MG tablet TAKE 1 TO 2 TABLETS BY MOUTH DAILY AS NEEDED (Patient not taking: Reported on 10/23/2023) 180 tablet 1   No facility-administered medications prior to visit.    ROS: Review of Systems  Constitutional:  Negative for appetite change, fatigue and unexpected weight change.  HENT:  Negative for congestion, nosebleeds, sneezing, sore throat and trouble swallowing.   Eyes:  Negative for itching and visual disturbance.  Respiratory:  Negative for cough.   Cardiovascular:  Positive for leg swelling. Negative for chest pain and palpitations.  Gastrointestinal:  Negative for abdominal distention, blood in stool, diarrhea and nausea.  Genitourinary:  Negative for frequency and hematuria.  Musculoskeletal:   Positive for arthralgias, back pain and gait problem. Negative for joint swelling and neck pain.  Skin:  Negative for rash.  Neurological:  Negative for dizziness, tremors, speech difficulty and weakness.  Hematological:  Bruises/bleeds easily.  Psychiatric/Behavioral:  Negative for agitation, dysphoric mood, sleep disturbance and suicidal ideas. The patient is not nervous/anxious.     Objective:  BP 120/70   Pulse 84   Temp 98.7 F (37.1 C) (Oral)   Ht 6' (1.829 m)   Wt 182 lb (82.6 kg)   SpO2 98%   BMI 24.68 kg/m   BP Readings from Last 3 Encounters:  10/23/23 120/70  09/04/23 (!) 127/98  08/27/23 133/88    Wt Readings from Last 3 Encounters:  10/23/23 182 lb (82.6 kg)  09/04/23 180 lb 14.4 oz (82.1 kg)  08/27/23 180 lb (81.6 kg)    Physical Exam Constitutional:      General: He is not in acute distress.    Appearance: He is well-developed.     Comments: NAD  Eyes:     Conjunctiva/sclera: Conjunctivae normal.     Pupils: Pupils are equal, round, and reactive to light.  Neck:     Thyroid: No thyromegaly.     Vascular: No JVD.  Cardiovascular:     Rate and Rhythm: Normal rate and regular rhythm.     Heart sounds: Normal heart sounds. No murmur heard.    No friction rub. No gallop.  Pulmonary:     Effort: Pulmonary effort is normal. No respiratory distress.     Breath sounds: Normal breath sounds. No wheezing or rales.  Chest:     Chest wall: No tenderness.  Abdominal:     General: Bowel sounds are normal. There is no distension.     Palpations: Abdomen is soft. There is no mass.     Tenderness: There is no abdominal tenderness. There is no guarding or rebound.  Musculoskeletal:        General: No tenderness. Normal range of motion.     Cervical back: Normal range of motion.     Right lower leg: Edema present.     Left lower leg: Edema present.  Lymphadenopathy:     Cervical: No cervical adenopathy.  Skin:    General: Skin is warm and dry.     Findings:  No erythema or rash.  Neurological:     Mental Status: He is alert and oriented to person, place, and time.     Cranial Nerves: No cranial nerve deficit.     Motor: No abnormal muscle tone.     Coordination: Coordination normal.  Gait: Gait normal.     Deep Tendon Reflexes: Reflexes are normal and symmetric.  Psychiatric:        Behavior: Behavior normal.        Thought Content: Thought content normal.        Judgment: Judgment normal.    L>R LLE ?venous insufficiency trace edema High arches   Lab Results  Component Value Date   WBC 8.1 08/26/2023   HGB 14.4 08/26/2023   HCT 44.6 08/26/2023   PLT 293.0 08/26/2023   GLUCOSE 92 08/26/2023   CHOL 186 08/26/2023   TRIG 263.0 (H) 08/26/2023   HDL 60.60 08/26/2023   LDLDIRECT 59.0 02/02/2016   LDLCALC 72 08/26/2023   ALT 36 08/26/2023   AST 32 08/26/2023   NA 137 08/26/2023   K 4.3 08/26/2023   CL 102 08/26/2023   CREATININE 1.07 08/26/2023   BUN 22 08/26/2023   CO2 24 08/26/2023   TSH 0.70 08/26/2023   PSA 3.52 08/26/2023   INR 1.0 09/13/2019   HGBA1C 5.5 09/29/2018    MR Lumbar Spine W Wo Contrast Result Date: 10/02/2023 CLINICAL DATA:  68 year old male with persistent lumbar region pain, radiculopathy. Prior surgery. EXAM: MRI LUMBAR SPINE WITHOUT AND WITH CONTRAST TECHNIQUE: Multiplanar and multiecho pulse sequences of the lumbar spine were obtained without and with intravenous contrast. CONTRAST:  8 mL [employer] COMPARISON:  CT Abdomen and Pelvis 09/02/2019. Previous lumbar MRI 04/07/2023. FINDINGS: Segmentation: Normal on the 2021 CT, the same numbering system used on the MRI last year. Alignment: Chronic levoconvex lumbar scoliosis is moderate, and might have progressed since the 2021 CT. Rotatory component appears stable from the MRI last year. Associated chronic straightening of lumbar lordosis. Retrolisthesis of L1 on L2 has progressed since 2021, stable since last year. Vertebrae: Fairly extensive hardware  susceptibility artifact from the L2 to the L5 levels. Normal background bone marrow signal. Stable vertebral body height since last year. Chronic degenerative endplate marrow signal changes at L1-L2. No convincing marrow edema or acute osseous abnormality. Intact visible sacrum and SI joints. Conus medullaris and cauda equina: Conus extends to the T12-L1 level. No lower spinal cord or conus signal abnormality. No abnormal intradural enhancement. No dural thickening is evident. Paraspinal and other soft tissues: Chronic postoperative changes to the lumbar paraspinal soft tissues. No postoperative fluid collection. Negative visible abdominal viscera. Disc levels: Visible lower thoracic levels through T12-L1: Disc bulging with no significant spinal stenosis. L1-L2: Severe disc space loss. Retrolisthesis. Vacuum disc. Bulky circumferential disc osteophyte complex asymmetric to the right. Mild to moderate facet and ligament flavum hypertrophy. Moderate to severe spinal stenosis (series 105, image 9) with severe right lateral recess stenosis (right L2 nerve level). Moderate left and mild to moderate right L1 neural foraminal stenosis. L2-L3:  Stable appearance of prior decompression and fusion. L3-L4:  Stable appearance of prior decompression and fusion. L4-L5: Stable appearance of prior decompression and fusion. Chronic right lateral recess stenosis (right L5 nerve level series 105, images 27 and 28). L5-S1: Maintained disc height. Moderate facet hypertrophy. Chronic degenerative facet joint fluid on the right. No spinal or lateral recess stenosis. No convincing foraminal stenosis. IMPRESSION: 1. Stable MRI appearance of prior L2 through L5 fusion. Stable mild adjacent segment disease at L5-S1 without stenosis. 2. 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 Subsequent moderate to severe spinal and right lateral recess stenosis. Moderate foraminal stenosis.  Electronically Signed   By: Odessa Fleming [name]   On: 10/02/2023  13:03    Assessment & Plan:   Problem List Items Addressed This Visit     Anxiety disorder    Ongoing stress      Coronary atherosclerosis   ASA, amlodipine, krill oil, Lipitor      Edema - Primary   L>R LLE venous insufficiency per Ortho - he was told to see a vascular specialist Compression socks      Relevant Orders   Ambulatory referral to Vascular Surgery      No orders of the defined types were placed in this encounter.      Follow-up: Return in about 3 months (around 01/23/2024) for a follow-up visit.  Sonda Primes, MD

## 2023-10-28 ENCOUNTER — Ambulatory Visit (HOSPITAL_COMMUNITY): Payer: BLUE CROSS/BLUE SHIELD | Admitting: Licensed Clinical Social Worker

## 2023-10-29 ENCOUNTER — Other Ambulatory Visit: Payer: Self-pay

## 2023-10-29 DIAGNOSIS — I872 Venous insufficiency (chronic) (peripheral): Secondary | ICD-10-CM

## 2023-10-30 ENCOUNTER — Ambulatory Visit (INDEPENDENT_AMBULATORY_CARE_PROVIDER_SITE_OTHER): Admitting: Licensed Clinical Social Worker

## 2023-10-30 DIAGNOSIS — F319 Bipolar disorder, unspecified: Secondary | ICD-10-CM | POA: Diagnosis not present

## 2023-11-04 ENCOUNTER — Encounter (HOSPITAL_BASED_OUTPATIENT_CLINIC_OR_DEPARTMENT_OTHER): Payer: Self-pay | Admitting: Physical Therapy

## 2023-11-04 ENCOUNTER — Ambulatory Visit (HOSPITAL_COMMUNITY): Payer: BLUE CROSS/BLUE SHIELD | Admitting: Licensed Clinical Social Worker

## 2023-11-04 ENCOUNTER — Ambulatory Visit (HOSPITAL_BASED_OUTPATIENT_CLINIC_OR_DEPARTMENT_OTHER): Admitting: Physical Therapy

## 2023-11-04 DIAGNOSIS — R2689 Other abnormalities of gait and mobility: Secondary | ICD-10-CM

## 2023-11-04 DIAGNOSIS — M6281 Muscle weakness (generalized): Secondary | ICD-10-CM

## 2023-11-04 DIAGNOSIS — M25562 Pain in left knee: Secondary | ICD-10-CM | POA: Diagnosis not present

## 2023-11-04 DIAGNOSIS — M25561 Pain in right knee: Secondary | ICD-10-CM | POA: Diagnosis not present

## 2023-11-04 DIAGNOSIS — G8929 Other chronic pain: Secondary | ICD-10-CM

## 2023-11-04 DIAGNOSIS — R600 Localized edema: Secondary | ICD-10-CM | POA: Diagnosis not present

## 2023-11-04 NOTE — Therapy (Signed)
 OUTPATIENT PHYSICAL THERAPY LOWER EXTREMITY TREATMENT   Patient Name: Alex [name] MRN: [id number] DOB:[birth year], 68 y.o., male Today's Date: 11/04/2023  END OF SESSION:  PT End of Session - 11/04/23 1232     Visit Number 2    Number of Visits 17    Date for PT Re-Evaluation 12/12/23    Authorization Type BCBS    PT Start Time 1230    PT Stop Time 1308    PT Time Calculation (min) 38 min             Past Medical History:  Diagnosis Date   Anemia    Anxiety    Atypical nevus 04/12/1997   dyplastic-left chest below nipple   Atypical nevus 01/18/2005   slight-mod-mid upper abd, slight-mod-right lateral chest-(WS), slight-mod-mid lower back (punch)   Atypical nevus 05/31/2005   dysplastic-central lowerback (exc), dysplastic- right abdomen (Exc)   Basal cell carcinoma 06/04/2016   back of neck   Bipolar I disorder (HCC)    Bleeding ulcer 2016   BPH (benign prostatic hypertrophy) with urinary obstruction    Cancer (HCC)    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 Burna Cash, oncology. Receives monthly IVIG 40 grams.   Depression    Epicondylitis    right elbow   GERD (gastroesophageal reflux disease)    Glaucoma BOTH EYES   RIGHT EYE RADIATION DAMAGE   Hearing loss    Bilateral   Hepatic cyst    Several, The lesion of concern in segment 6 of the liver has single large portal vein and hepatic vein branches extending to tt, in a pattern of enhancement which mirrors  these vascular structures. 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 9 CM X 4. 3 CM PER NOV 06-14-17  CHEST CTFOLOWED BY DR Jens Som YEARLY FOR   Positional vertigo    HX OF WITH SINUS INFECTIONS   Prostate cancer (HCC)    Follows w/  Dr. Maisie Fus Polascik @ [facility].   Radial head fracture    Right   Squamous cell carcinoma of skin 06/04/2016   in situ-crown of scalp   Squamous cell carcinoma of skin 04/22/2017   in situ-crown scalp (txpbx)   Thoracic aortic aneurysm (HCC) 06/14/2017   last CT 4.1 CM Mild   Tinnitus    CONSTANT   Ulnar nerve compression    right elbow   Unsteady gait    especially with stairs, depth perception off   Urinary hesitancy    Wears glasses    Past Surgical History:  Procedure Laterality Date   CARDIAC CATHETERIZATION  01-16-2006  DR Maisie Fus WALL   MILD CORONARY ATHEROSCLEROSIS/ MID TO DISTAL LAD 40% STENOSIS/ LVF 50-55%   CARPAL TUNNEL RELEASE Right 11/03/2017   Procedure: RIGHT HAND CARPAL TUNNEL RELEASE;  Surgeon: Bradly Bienenstock, MD;  Location: [facility] [place];  Service: Orthopedics;  Laterality: Right;   CATARACT  EXTRACTION Right    COLONSCOPY  2017 LAST DONE   MULTIPLE   CYST EXCISION Right 09/04/2023   Procedure: EXCISION CYST RIGHT SHOULDER;  Surgeon: Darnell Level, MD;  Location: [facility] [place];  Service: General;  Laterality: Right;  LOCAL & MAC   ENDOSCOPY  LAST 2017   MULTIPLE DONE DILATION DONE ALSO   ESOPHAGOGASTRODUODENOSCOPY (EGD) WITH PROPOFOL N/A 02/24/2018   Procedure: ESOPHAGOGASTRODUODENOSCOPY (EGD) WITH PROPOFOL;  Surgeon: Carman Ching, MD;  Location: [facility];  Service: Endoscopy;  Laterality: N/A;   EXCISION RADIAL HEAD Right 11/03/2017   Procedure: RIGHT PROXIMAL RADIUS RADIAL HEAD RESECTION AND JOINT DEBRIDEMENT;  Surgeon: Bradly Bienenstock, MD;  Location: [facility] [place];  Service: Orthopedics;  Laterality: Right;   EXTRACORPOREAL SHOCK WAVE LITHOTRIPSY Right 11/20/2020   Procedure: EXTRACORPOREAL SHOCK WAVE LITHOTRIPSY (ESWL);  Surgeon: Sebastian Ache, MD;  Location: [facility];  Service: Urology;  Laterality: Right;  75 MINS   KNEE ARTHROSCOPY  05/01/2012   Procedure: ARTHROSCOPY KNEE;  Surgeon: Javier Docker, MD;  Location: [facility];  Service: Orthopedics;  Laterality: Left;  debridement and removal of loose body   LEFT ANKLE ARTHROSCOPY W/ DEBRIDEMENT  05/12/2007   LEFT HYDROCELECTOMY  03/29/2005   AND REPAIR LEFT INGUINAL HERNIA W/ MESH   MOHS SURGERY  2002   RIGHT ORBITAL CANCER   NASAL ENDOSCOPY  08/07/2005   RIGHT EPISTAXIS  / POST SEPTOPLASTY  (HX RIGHT ORBITAL CA & S/P RADIATION/ NECROSIS ANTERIOR END OF BOTH INFERIOR TURBINATES)   occuloplastic surgery  2002   PARS PLANA VITRECTOMY  11/06/2004   RIGHT EYE RADIATION RETINOPATHY W/ HEMORRHAGE   PROSTATE ABLATION N/A 06/2022   RADIAL HEAD ARTHROPLASTY Right 06/15/2018   Procedure: RIGHT ELBOW PROXIMAL RADIOULNAR JOINT DEBRIDEMENT AND ARTHROPLASTY;  Surgeon: Bradly Bienenstock, MD;  Location: [facility] [place];  Service: Orthopedics;  Laterality: Right;   BLOCK WITH SEDATION   REPAIR UNDESENDED RIGHT TESTICLE / RIGHT INGUINAL HERNIA  AGE 55   RIGHT ANKLE ARTHROSCOPY W/ EXTENSIVE DEBRIDEMENT  04/05/2008   x2   RIGHT SHOULDER SURGERY  2006   RIGHT SUPRAOMOHYOID NECK DISSECTION   03/08/2003   ZONES 1,2,3;   SUBMANDIBULAR MASS / METASTATIC SQUAMOUS CELL CARCINOMA RIGHT NECK   SAVORY DILATION N/A 02/24/2018   Procedure: SAVORY DILATION;  Surgeon: Carman Ching, MD;  Location: [facility];  Service: Endoscopy;  Laterality: N/A;   SEPTOPLASTY  06/2005   SHOULDER ARTHROSCOPY Left    SHOULDER ARTHROSCOPY W/ SUBACROMIAL DECOMPRESSION AND DISTAL CLAVICLE EXCISION  10/09/2008   AND DEBRIDEMENT OF RIGHT SHOULDER IMPINGEMENT & AC JOINT ARTHRITIS   SPINE SURGERY  2016   l 3 TO l 4 PLATE AND SCREWS   TOTAL THYROIDECTOMY  11/03/2001   PAPILLARY THYROID CARCINOMA   TRANSTHORACIC ECHOCARDIOGRAM  07/2011   grade I diastolic dysfunction/ ef 55-60%   ULNAR NERVE TRANSPOSITION Right 04/28/2014   Procedure: RIGHT ELBOW ULNA NERVE RELEASE TRANSPOSTION AND MEDIAL EPICONDYLAR DEBRIDEMENT AND REPAIR;  Surgeon: Sharma Covert, MD;  Location: [facility];  Service: Orthopedics;  Laterality: Right;   Patient Active Problem List   Diagnosis Date Noted   Primary osteoarthritis of both knees 09/22/2023   Boil 08/26/2023   Abscess 08/20/2023   Cellulitis 05/12/2023   Bruise of lower lip 04/17/2023   Wheezing 03/31/2023   Injury of medial collateral ligament (MCL) of knee 02/07/2023   Sprain of medial collateral ligament of right knee 02/07/2023   Osteoarthritis of subtalar joint 09/25/2022   Cystoid macular edema of right eye 07/18/2022   Hyperglycemia 07/18/2022   Nonexudative age-related macular degeneration, left eye, early dry stage 07/18/2022   Retinal edema 07/18/2022   Superficial punctate keratitis of right eye 07/18/2022   Grief at loss of child 05/22/2022   Actinic keratoses 05/22/2022   Pain in joint of left elbow 04/04/2022    Abnormal defecation 02/26/2022   Atrophic gastritis 02/26/2022   Bipolar 1 disorder (HCC) 02/26/2022   BPH (benign prostatic hyperplasia) 02/26/2022   Bronchiolectasis (HCC) 02/26/2022   Disease of thyroid gland 02/26/2022   Diverticular disease of colon 02/26/2022   Family history of malignant neoplasm of digestive organs 02/26/2022   Hyperlipidemia 02/26/2022   IgG deficiency (HCC) 02/26/2022   Immune deficiency disorder (HCC) 02/26/2022   Irritable bowel syndrome 02/26/2022   Pancreatic cyst 02/26/2022   History of colonic polyps 02/26/2022   Portosystemic shunt, 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/26/2022   Pyloric ulcer 02/26/2022   Scoliosis 02/26/2022   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   Spinal stenosis of lumbar region 02/26/2022   Prostate cancer (HCC) 02/26/2022   Dysuria 10/03/2021   Earache 06/14/2021   Anemia 06/14/2021   History of head and neck cancer 03/28/2021   Swelling of right parotid gland 03/28/2021   Epistaxis, recurrent 06/26/2020   PMR (polymyalgia rheumatica) (HCC) 02/22/2020   Diverticulitis 01/25/2020   Abdominal pain 01/25/2020   Iliac vessel injury 11/30/2019   Stress at home 09/14/2019   LLQ abdominal pain 09/06/2019   IPMN (intraductal papillary mucinous neoplasm) 07/19/2019   History of thyroid cancer 06/03/2019   Abnormal findings on diagnostic imaging of liver 06/03/2019   Epididymitis 03/16/2019   Difficulty urinating 03/16/2019   Pain in right foot 03/12/2019   Hypothyroidism (acquired) 09/29/2018   Moderate persistent asthma with acute exacerbation 09/29/2018   Prediabetes 09/29/2018   RTI (respiratory tract infection) 09/29/2018   ETD (Eustachian tube dysfunction), bilateral 09/02/2018   Sensorineural hearing loss (SNHL) of both ears 07/31/2018   Bruising 11/17/2017   Encounter for other orthopedic aftercare 11/14/2017   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   Closed fracture of head of  right radius 10/24/2017   Palatal mass 09/16/2017   Sore throat 09/16/2017   Testicular pain, right 08/01/2017   Combined form of age-related cataract, left eye 05/19/2017   Dry eye syndrome of both eyes 05/19/2017   Primary open angle glaucoma (POAG) of left eye, mild stage 05/19/2017   Radiation retinopathy, 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   Secondary glaucoma due to  combination mechanisms, right, severe stage 05/19/2017   Fatigue 01/21/2017   Ankle pain, left 01/21/2017   Fall (on) (from) other stairs and steps, initial encounter 11/11/2016   Abrasion of head 11/11/2016   Ascending aortic aneurysm (HCC) 07/23/2016   Erectile dysfunction 07/19/2016   Bronchiectasis (HCC) 04/23/2016   Chronic bronchitis (HCC) 03/11/2016   Cerumen impaction 08/04/2015   Bilateral impacted cerumen 08/04/2015   Cervical disc disorder with radiculopathy of cervical region 06/06/2014   Elevated WBC count 06/06/2014   Iron deficiency anemia 06/06/2014   Edema 02/21/2014   Tinnitus 02/21/2014   Well adult exam 05/31/2013   Glaucoma 05/31/2013   RML pneumonia 03/31/2013   Hypertension, uncontrolled 02/25/2013   Anxiety disorder 08/08/2010   Chest pain, atypical 02/08/2010   ESOPHAGEAL STRICTURE 11/30/2009   Osteoarthritis 08/07/2009   Malaise and fatigue 05/14/2008   Coronary atherosclerosis 11/04/2007   Lumbago 11/04/2007   THYROIDECTOMY, HX OF 11/04/2007   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    PCP: Tresa Garter, MD  REFERRING PROVIDER: Guillermina City, PA-C  REFERRING DIAG: [phone] (ICD-10-CM) - Right knee pain M25.562 (ICD-10-CM) - Left knee pain R60.0 Bilateral edema of lower extremity  THERAPY DIAG:  Chronic pain of both knees  Other abnormalities of gait and mobility  Muscle weakness (generalized)  Rationale for Evaluation and Treatment: Rehabilitation  ONSET DATE: over a year ago  SUBJECTIVE:   SUBJECTIVE STATEMENT: Pt reports his Rt knee pain is a little  greater today. Has been completing HEP.    From initial evaluation:  Pt states he has BIL knee pain (L>R) for over a year at this point, first noticed difficulty with floor transfers and then worsened to the point where the majority of transfers bothered knees to some extent. Particularly bothersome with stairs. He denies any issues with swelling or N/T. He does endorse some chronic ankle swelling bilaterally that he is in communication w/ providers about. We also discuss his complex medical history in depth today, particularly in regard to his back for which he states he will likely be receiving an epidural at some point for. He does mention that he received BIL knee injections ~3 days ago with about 50% relief.   PERTINENT HISTORY: anemia, anxiety, bipolar 1, BPH, basal cell carcinoma, other hx of cancer, CVID, depression, GERD, HTN, PVD, vertigo, thoracic aortic aneurysm, hx thyroidectomy, hx lumbar fusion  PAIN:  Are you having pain: yes Location/description: BIL knees (R>L), anterior knee  NPRS 4/10  - aggravating factors: stair navigation, kneeling, floor transfers, transfers from lower surfaces  - Easing factors: injections, positional changes    PRECAUTIONS: cancer history, aortic aneurysm (no isometrics)  WEIGHT BEARING RESTRICTIONS: No  FALLS:  Has patient fallen in last 6 months? 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 Does have some limitations due to back issues  PATIENT GOALS: would like to be more active  NEXT MD VISIT: TBD, plans to follow with Dr. Allena Katz beginning of April   OBJECTIVE:  Note: Objective measures were completed at Evaluation unless otherwise noted.  DIAGNOSTIC FINDINGS:  Recent MRI (09/29/23) for  lumbar spine, no recent LE imaging in chart - pt states he had XR done through [facility], showed arthritis and edema in  both knees, states they didn't feel he needed an MRI   PATIENT SURVEYS:  LEFS 38/80  COGNITION: Overall cognitive status: Within functional limits for tasks assessed     SENSATION: Does not endorse any sensory complaints today  EDEMA:  Denies issues with knee edema     POSTURE: fwd head, rounded shoulders  PALPATION: Concordant tenderness anterior knees BIL, along joint line and patellar tendons. R is more tender to the touch than the L although the L is reportedly more symptomatic   LOWER EXTREMITY ROM:      Right eval Left eval  Hip flexion    Hip extension    Hip internal rotation    Hip external rotation    Knee extension full Full (passively, does have mild limitation actively against gravity)   Knee flexion 130 deg * 110 deg *  (Blank rows = not tested) (Key: WFL = within functional limits not formally assessed, * = concordant pain, s = stiffness/stretching sensation, NT = not tested)  Comments:    LOWER EXTREMITY MMT:    MMT Right eval Left eval  Hip flexion 4 4-  Hip abduction (modified sitting) 4 4  Hip internal rotation    Hip external rotation    Knee flexion 4+ 4  Knee extension 4+ 4  Ankle dorsiflexion 4 4   (Blank rows = not tested) (Key: WFL = within functional limits not formally assessed, * = concordant pain, s = stiffness/stretching sensation, NT = not tested)  Comments:     FUNCTIONAL TESTS:  5xSTS: 12.7sec no UE support, crepitus BIL, L knee pain  GAIT: Distance walked: within clinic Assistive device utilized: None Level of assistance: Complete Independence Comments: widened BOS, reduced truncal rotation, reduced knee ROM BIL throughout all phases of gait but more prominent on L                                                                                                                                 TREATMENT  DATE:  [facility] Adult PT Treatment:                                                DATE: 11/04/23  Therapeutic Exercise: Upright bike, L7 x 6 min for warm up Seated quad stretch with single foot under chair, sliding hips forward x 20s x 3 each LE Seated hamstring stretch x 20s x 3 each LE, cues for posture STS from elevated table with forward arm reach, hip hinge and core engaged x 8  Bridge with core engaged x 10 SLR x 10 LLE, x 5 RLE STanding knee flexion at counter x 10 each    [facility] Adult PT Treatment:  DATE: 10/17/23 Therapeutic Exercise: Seated quad set x10 Standing knee flexion AROM HEP handout + education, relevant anatomy/physiology and rationale for interventions   PATIENT EDUCATION:  Education details:  Person educated: Patient Education method: Explanation, Demonstration, Actor cues, Verbal cues Education comprehension: verbalized understanding, returned demonstration, verbal cues required, tactile cues required, and needs further education    HOME EXERCISE PROGRAM: Access Code: [email or web address] URL: [email or web address] Date: 10/17/2023 Prepared by: Fransisco Hertz  Exercises - Seated Quad Set  - 2-3 x daily - 1 sets - 10 reps - Standing Knee Flexion  - 2-3 x daily - 1 sets - 10 reps  ASSESSMENT:  CLINICAL IMPRESSION: Pt reported some increase in R knee discomfort with STS at elevated surface, as well as standing knee flexion. Updated HEP. Will continue to progress as tolerated.  Goals are ongoing.     From Initial evaluation: Patient is a pleasant 68 y.o. gentleman who was seen today for physical therapy evaluation and treatment for bilateral knee pain ongoing over past year, much improved with recent injections. Primarily limited in closed chain activities involving knee flexion such as transfers and stair navigation. On exam he demonstrates concordant limitations in knee mobility and strength, L more affected than  R. His 5xSTS time is right at cutoff score for fall risk in majority of populations (12-14sec, varying) and elicits mild knee pain. No adverse events, tolerates exam/HEP well without increase in resting pain. Recommend trial of skilled PT to address aforementioned deficits with aim of improving functional tolerance and reducing pain with typical activities. Pt departs today's session in no acute distress, all voiced concerns/questions addressed appropriately from PT perspective.       OBJECTIVE IMPAIRMENTS: Abnormal gait, decreased activity tolerance, decreased endurance, decreased mobility, difficulty walking, decreased ROM, decreased strength, postural dysfunction, and pain.   ACTIVITY LIMITATIONS: standing, squatting, stairs, transfers, and locomotion level  PARTICIPATION LIMITATIONS: meal prep, cleaning, laundry, and community activity  PERSONAL FACTORS: Age, Time since onset of injury/illness/exacerbation, and 3+ comorbidities: anemia, anxiety, bipolar 1, BPH, basal cell carcinoma, other hx of cancer, CVID, depression, GERD, HTN, PVD, vertigo, thoracic aortic aneurysm, hx thyroidectomy, hx lumbar fusion  are also affecting patient's functional outcome.   REHAB POTENTIAL: Fair given chronicity and comorbidities  CLINICAL DECISION MAKING: Evolving/moderate complexity  EVALUATION COMPLEXITY: Moderate   GOALS:   SHORT TERM GOALS: Target date: 11/14/2023  Pt will demonstrate appropriate understanding and performance of initially prescribed HEP in order to facilitate improved independence with management of symptoms.  Baseline: HEP established  Goal status: INITIAL   2. Pt will report at least 25% improvement in overall pain levels over past week in order to facilitate improved tolerance to typical daily activities.   Baseline: 0-8/10  Goal status: INITIAL    LONG TERM GOALS: Target date: 12/12/2023  Pt will score 50/80 or greater on LEFS in order to demonstrate improved perception of  function due to symptoms (MCID 9 pts) Baseline: 38/80 Goal status: INITIAL  2.  Pt will demonstrate at least 120 degrees of knee flexion AROM bilaterally in order to facilitate improved tolerance to gait/transfers.  Baseline: see ROM chart above Goal status: INITIAL  3.  Pt will report at least 50% decrease in overall pain levels in past week in order to facilitate improved tolerance to basic ADLs/mobility.   Baseline: 0-8/10  Goal status: INITIAL    4.  Pt will be able to perform 5xSTS in less than or equal to 10 in order to demonstrate reduced fall risk and  improved functional independence (MCID 5xSTS = 2.3 sec). Baseline: 12sec no UE support Goal status: INITIAL   5. Pt will report/demonstrate ability to navigate at least 13 steps w/o UE support and less than 3 pt increase in pain in order to facilitate improved tolerance to home navigation  Baseline: pain with stair navigation  Goal status: INITIAL   PLAN:  PT FREQUENCY: 1-2x/week  PT DURATION: 8 weeks  PLANNED INTERVENTIONS: 97164- PT Re-evaluation, 97110-Therapeutic exercises, 97530- Therapeutic activity, 97112- Neuromuscular re-education, 97535- Self Care, 16109- Manual therapy, [id number]- Gait training, [phone]- Aquatic Therapy, Patient/Family education, Balance training, Stair training, Taping, Dry Needling, Joint mobilization, Cryotherapy, and Moist heat  PLAN FOR NEXT SESSION: Review/update HEP PRN. Work on [employer] exercises as appropriate with emphasis on quad activation, knee flexion mobility (especially on L), closed chain stability and functional mechanics. Symptom modification strategies as indicated/appropriate. Mindful of complex medical history including aortic aneurysm, cancer.  Mayer Camel, PTA 11/04/23 6:05 PM [facility] [address] [place], [name], [address] Phone: [phone]   Fax:  [phone]

## 2023-11-05 ENCOUNTER — Ambulatory Visit (HOSPITAL_COMMUNITY): Admitting: Licensed Clinical Social Worker

## 2023-11-05 ENCOUNTER — Encounter (HOSPITAL_COMMUNITY): Payer: Self-pay | Admitting: Licensed Clinical Social Worker

## 2023-11-05 DIAGNOSIS — F319 Bipolar disorder, unspecified: Secondary | ICD-10-CM

## 2023-11-05 NOTE — Progress Notes (Unsigned)
 Virtual virtual Video Note  I connected with Alex [name] on 11/05/2023 at 2-3pm EST by video-enabled virtual visit. I verified that I am speaking with the correct person using  two identifiers.I discussed the limitations of evaluation and management by telemedicine and the availability of in person appointments. The patient expressed understanding and agreed to proceed.    LOCATION: Patient: Home  Provider: Home office   History of Present Illness:  Pt was referred by Dr. Lolly Mustache for OP therapy for bipolar disorder and anxiety.  Treatment Goal Addressed:  Pt will meet with clinician 1x every 2 weeks for therapy to monitor for progress towards goals and address any barriers to success; Reduce depression from average severity level of 6/10 down to a 4/10 in next 90 days by engaging in 1-2 positive coping skills daily as part of developing self-care routine; Reduce average anxiety level from 7/10 down to 5/10 in next 90 days by utilizing 1-2 relaxation skills/grounding skills per day, such as mindful breathing, progressive muscle relaxation, positive visualizations.  Progress towards Treatment Goal: Progressing   Observations/Objective: Patient presented for today's session on time and was alert, oriented x5, with no evidence or self-report of SI/HI or A/V H.  Patient reported ongoing compliance with medication and denied any use of alcohol or illicit substances.  Clinician inquired about patient's current emotional ratings, as well as any significant changes in thoughts, feelings or behavior since previous session. Patient reported scores of  8/10 for depression, 8/10 for anxiety, 3/10 for anger/irritability. [name] and pt explored his emotional ratings, and discussed coping skills for stressors. 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 Pt participated in a discussion about being productive even when he has physical issues. [name] used CBT to help pt process thoughts, feelings, and behaviors. [name] provided supportive feedback.  Assessment and plan: Counselor will continue to meet with patient to address treatment plan goals. Patient will continue to follow recommendations of providers and imnd implement skills learned in session, and practice between sessions. Diagnosis: Bipolar 1 disorder.    Collaboration of care: Other:   Continue working with providers    Patient/Guardian was advised Release of Information must be obtained prior to any record release in order to collaborate their care with an outside provider. Patient/Guardian was advised if they have not already done so to contact the registration department to sign all necessary forms in order for [place] to release information regarding their care.    Consent: Patient/Guardian gives verbal consent for treatment and assignment of benefits for services provided during this visit. Patient/Guardian expressed understanding and agreed to proceed.     Follow Up Instructions:  I discussed the assessment and treatment plan with the patient. The patient was provided an opportunity to ask questions and all were answered. The patient agreed with the plan and demonstrated an understanding of the instructions.   The patient was advised to call back or seek an in-person evaluation if the symptoms worsen or if the condition fails to improve as anticipated.  I provided 60 minutes of non-face-to-face time during this encounter.   Ely Spragg S, LCAS  11/05/23

## 2023-11-06 ENCOUNTER — Encounter (HOSPITAL_BASED_OUTPATIENT_CLINIC_OR_DEPARTMENT_OTHER): Payer: Self-pay | Admitting: Physical Therapy

## 2023-11-06 ENCOUNTER — Ambulatory Visit (HOSPITAL_BASED_OUTPATIENT_CLINIC_OR_DEPARTMENT_OTHER): Admitting: Physical Therapy

## 2023-11-06 DIAGNOSIS — R2689 Other abnormalities of gait and mobility: Secondary | ICD-10-CM | POA: Diagnosis not present

## 2023-11-06 DIAGNOSIS — G8929 Other chronic pain: Secondary | ICD-10-CM

## 2023-11-06 DIAGNOSIS — M6281 Muscle weakness (generalized): Secondary | ICD-10-CM

## 2023-11-06 DIAGNOSIS — M25561 Pain in right knee: Secondary | ICD-10-CM | POA: Diagnosis not present

## 2023-11-06 DIAGNOSIS — M25562 Pain in left knee: Secondary | ICD-10-CM | POA: Diagnosis not present

## 2023-11-06 DIAGNOSIS — R600 Localized edema: Secondary | ICD-10-CM | POA: Diagnosis not present

## 2023-11-06 NOTE — Therapy (Signed)
 OUTPATIENT PHYSICAL THERAPY LOWER EXTREMITY TREATMENT   Patient Name: Alex [name] MRN: [id number] DOB:Dec 25, 1955, 68 y.o., male Today's Date: 11/06/2023  END OF SESSION:  PT End of Session - 11/06/23 1400     Visit Number 3    Number of Visits 17    Date for PT Re-Evaluation 12/12/23    Authorization Type BCBS    PT Start Time 1357    PT Stop Time 1435    PT Time Calculation (min) 38 min             Past Medical History:  Diagnosis Date   Anemia    Anxiety    Atypical nevus 04/12/1997   dyplastic-left chest below nipple   Atypical nevus 01/18/2005   slight-mod-mid upper abd, slight-mod-right lateral chest-(WS), slight-mod-mid lower back (punch)   Atypical nevus 05/31/2005   dysplastic-central lowerback (exc), dysplastic- right abdomen (Exc)   Basal cell carcinoma 06/04/2016   back of neck   Bipolar I disorder (HCC)    Bleeding ulcer 2016   BPH (benign prostatic hypertrophy) with urinary obstruction    Cancer (HCC)    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 Burna Cash, oncology. Receives monthly IVIG 40 grams.   Depression    Epicondylitis    right elbow   GERD (gastroesophageal reflux disease)    Glaucoma BOTH EYES   RIGHT EYE RADIATION DAMAGE   Hearing loss    Bilateral   Hepatic cyst    Several, The lesion of concern in segment 6 of the liver has single large portal vein and hepatic vein branches extending to tt, in a pattern of enhancement which mirrors  these vascular structures. 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 9 CM X 4. 3 CM PER NOV 06-14-17  CHEST CTFOLOWED BY DR Jens Som YEARLY FOR   Positional vertigo    HX OF WITH SINUS INFECTIONS   Prostate cancer (HCC)    Follows w/  Dr. Maisie Fus Polascik @ [facility].   Radial head fracture    Right   Squamous cell carcinoma of skin 06/04/2016   in situ-crown of scalp   Squamous cell carcinoma of skin 04/22/2017   in situ-crown scalp (txpbx)   Thoracic aortic aneurysm (HCC) 06/14/2017   last CT 4.1 CM Mild   Tinnitus    CONSTANT   Ulnar nerve compression    right elbow   Unsteady gait    especially with stairs, depth perception off   Urinary hesitancy    Wears glasses    Past Surgical History:  Procedure Laterality Date   CARDIAC CATHETERIZATION  01-16-2006  DR Maisie Fus WALL   MILD CORONARY ATHEROSCLEROSIS/ MID TO DISTAL LAD 40% STENOSIS/ LVF 50-55%   CARPAL TUNNEL RELEASE Right 11/03/2017   Procedure: RIGHT HAND CARPAL TUNNEL RELEASE;  Surgeon: Bradly Bienenstock, MD;  Location: [name];  Service: Orthopedics;  Laterality: Right;   CATARACT  EXTRACTION Right    COLONSCOPY  2017 LAST DONE   MULTIPLE   CYST EXCISION Right 09/04/2023   Procedure: EXCISION CYST RIGHT SHOULDER;  Surgeon: Darnell Level, MD;  Location: [facility];  Service: General;  Laterality: Right;  LOCAL & MAC   ENDOSCOPY  LAST 2017   MULTIPLE DONE DILATION DONE ALSO   ESOPHAGOGASTRODUODENOSCOPY (EGD) WITH PROPOFOL N/A 02/24/2018   Procedure: ESOPHAGOGASTRODUODENOSCOPY (EGD) WITH PROPOFOL;  Surgeon: Carman Ching, MD;  Location: [facility];  Service: Endoscopy;  Laterality: N/A;   EXCISION RADIAL HEAD Right 11/03/2017   Procedure: RIGHT PROXIMAL RADIUS RADIAL HEAD RESECTION AND JOINT DEBRIDEMENT;  Surgeon: Bradly Bienenstock, MD;  Location: [facility];  Service: Orthopedics;  Laterality: Right;   EXTRACORPOREAL SHOCK WAVE LITHOTRIPSY Right 11/20/2020   Procedure: EXTRACORPOREAL SHOCK WAVE LITHOTRIPSY (ESWL);  Surgeon: Sebastian Ache, MD;  Location: [facility];  Service: Urology;  Laterality: Right;  75 MINS   KNEE ARTHROSCOPY  05/01/2012   Procedure: ARTHROSCOPY KNEE;  Surgeon: Javier Docker, MD;  Location: [facility];  Service: Orthopedics;  Laterality: Left;  debridement and removal of loose body   LEFT ANKLE ARTHROSCOPY W/ DEBRIDEMENT  05/12/2007   LEFT HYDROCELECTOMY  03/29/2005   AND REPAIR LEFT INGUINAL HERNIA W/ MESH   MOHS SURGERY  2002   RIGHT ORBITAL CANCER   NASAL ENDOSCOPY  08/07/2005   RIGHT EPISTAXIS  / POST SEPTOPLASTY  (HX RIGHT ORBITAL CA & S/P RADIATION/ NECROSIS ANTERIOR END OF BOTH INFERIOR TURBINATES)   occuloplastic surgery  2002   PARS PLANA VITRECTOMY  11/06/2004   RIGHT EYE RADIATION RETINOPATHY W/ HEMORRHAGE   PROSTATE ABLATION N/A 06/2022   RADIAL HEAD ARTHROPLASTY Right 06/15/2018   Procedure: RIGHT ELBOW PROXIMAL RADIOULNAR JOINT DEBRIDEMENT AND ARTHROPLASTY;  Surgeon: Bradly Bienenstock, MD;  Location: [facility] ;  Service: Orthopedics;  Laterality: Right;   BLOCK WITH SEDATION   REPAIR UNDESENDED RIGHT TESTICLE / RIGHT INGUINAL HERNIA  AGE 63   RIGHT ANKLE ARTHROSCOPY W/ EXTENSIVE DEBRIDEMENT  04/05/2008   x2   RIGHT SHOULDER SURGERY  2006   RIGHT SUPRAOMOHYOID NECK DISSECTION   03/08/2003   ZONES 1,2,3;   SUBMANDIBULAR MASS / METASTATIC SQUAMOUS CELL CARCINOMA RIGHT NECK   SAVORY DILATION N/A 02/24/2018   Procedure: SAVORY DILATION;  Surgeon: Carman Ching, MD;  Location: [facility];  Service: Endoscopy;  Laterality: N/A;   SEPTOPLASTY  06/2005   SHOULDER ARTHROSCOPY Left    SHOULDER ARTHROSCOPY W/ SUBACROMIAL DECOMPRESSION AND DISTAL CLAVICLE EXCISION  10/09/2008   AND DEBRIDEMENT OF RIGHT SHOULDER IMPINGEMENT & AC JOINT ARTHRITIS   SPINE SURGERY  2016   l 3 TO l 4 PLATE AND SCREWS   TOTAL THYROIDECTOMY  11/03/2001   PAPILLARY THYROID CARCINOMA   TRANSTHORACIC ECHOCARDIOGRAM  07/2011   grade I diastolic dysfunction/ ef 55-60%   ULNAR NERVE TRANSPOSITION Right 04/28/2014   Procedure: RIGHT ELBOW ULNA NERVE RELEASE TRANSPOSTION AND MEDIAL EPICONDYLAR DEBRIDEMENT AND REPAIR;  Surgeon: Sharma Covert, MD;  Location: [facility];  Service: Orthopedics;  Laterality: Right;   Patient Active Problem List   Diagnosis Date Noted   Primary osteoarthritis of both knees 09/22/2023   Boil 08/26/2023   Abscess 08/20/2023   Cellulitis 05/12/2023   Bruise of lower lip 04/17/2023   Wheezing 03/31/2023   Injury of medial collateral ligament (MCL) of knee 02/07/2023   Sprain of medial collateral ligament of right knee 02/07/2023   Osteoarthritis of subtalar joint 09/25/2022   Cystoid macular edema of right eye 07/18/2022   Hyperglycemia 07/18/2022   Nonexudative age-related macular degeneration, left eye, early dry stage 07/18/2022   Retinal edema 07/18/2022   Superficial punctate keratitis of right eye 07/18/2022   Grief at loss of child 05/22/2022   Actinic keratoses 05/22/2022   Pain in joint of left elbow 04/04/2022    Abnormal defecation 02/26/2022   Atrophic gastritis 02/26/2022   Bipolar 1 disorder (HCC) 02/26/2022   BPH (benign prostatic hyperplasia) 02/26/2022   Bronchiolectasis (HCC) 02/26/2022   Disease of thyroid gland 02/26/2022   Diverticular disease of colon 02/26/2022   Family history of malignant neoplasm of digestive organs 02/26/2022   Hyperlipidemia 02/26/2022   IgG deficiency (HCC) 02/26/2022   Immune deficiency disorder (HCC) 02/26/2022   Irritable bowel syndrome 02/26/2022   Pancreatic cyst 02/26/2022   History of colonic polyps 02/26/2022   Portosystemic shunt, 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/26/2022   Pyloric ulcer 02/26/2022   Scoliosis 02/26/2022   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   Spinal stenosis of lumbar region 02/26/2022   Prostate cancer (HCC) 02/26/2022   Dysuria 10/03/2021   Earache 06/14/2021   Anemia 06/14/2021   History of head and neck cancer 03/28/2021   Swelling of right parotid gland 03/28/2021   Epistaxis, recurrent 06/26/2020   PMR (polymyalgia rheumatica) (HCC) 02/22/2020   Diverticulitis 01/25/2020   Abdominal pain 01/25/2020   Iliac vessel injury 11/30/2019   Stress at home 09/14/2019   LLQ abdominal pain 09/06/2019   IPMN (intraductal papillary mucinous neoplasm) 07/19/2019   History of thyroid cancer 06/03/2019   Abnormal findings on diagnostic imaging of liver 06/03/2019   Epididymitis 03/16/2019   Difficulty urinating 03/16/2019   Pain in right foot 03/12/2019   Hypothyroidism (acquired) 09/29/2018   Moderate persistent asthma with acute exacerbation 09/29/2018   Prediabetes 09/29/2018   RTI (respiratory tract infection) 09/29/2018   ETD (Eustachian tube dysfunction), bilateral 09/02/2018   Sensorineural hearing loss (SNHL) of both ears 07/31/2018   Bruising 11/17/2017   Encounter for other orthopedic aftercare 11/14/2017   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   Closed fracture of head of  right radius 10/24/2017   Palatal mass 09/16/2017   Sore throat 09/16/2017   Testicular pain, right 08/01/2017   Combined form of age-related cataract, left eye 05/19/2017   Dry eye syndrome of both eyes 05/19/2017   Primary open angle glaucoma (POAG) of left eye, mild stage 05/19/2017   Radiation retinopathy, 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   Secondary glaucoma due to  combination mechanisms, right, severe stage 05/19/2017   Fatigue 01/21/2017   Ankle pain, left 01/21/2017   Fall (on) (from) other stairs and steps, initial encounter 11/11/2016   Abrasion of head 11/11/2016   Ascending aortic aneurysm (HCC) 07/23/2016   Erectile dysfunction 07/19/2016   Bronchiectasis (HCC) 04/23/2016   Chronic bronchitis (HCC) 03/11/2016   Cerumen impaction 08/04/2015   Bilateral impacted cerumen 08/04/2015   Cervical disc disorder with radiculopathy of cervical region 06/06/2014   Elevated WBC count 06/06/2014   Iron deficiency anemia 06/06/2014   Edema 02/21/2014   Tinnitus 02/21/2014   Well adult exam 05/31/2013   Glaucoma 05/31/2013   RML pneumonia 03/31/2013   Hypertension, uncontrolled 02/25/2013   Anxiety disorder 08/08/2010   Chest pain, atypical 02/08/2010   ESOPHAGEAL STRICTURE 11/30/2009   Osteoarthritis 08/07/2009   Malaise and fatigue 05/14/2008   Coronary atherosclerosis 11/04/2007   Lumbago 11/04/2007   THYROIDECTOMY, HX OF 11/04/2007   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    PCP: Tresa Garter, MD  REFERRING PROVIDER: Guillermina City, PA-C  REFERRING DIAG: [phone] (ICD-10-CM) - Right knee pain M25.562 (ICD-10-CM) - Left knee pain R60.0 Bilateral edema of lower extremity  THERAPY DIAG:  Chronic pain of both knees  Other abnormalities of gait and mobility  Muscle weakness (generalized)  Rationale for Evaluation and Treatment: Rehabilitation  ONSET DATE: over a year ago  SUBJECTIVE:   SUBJECTIVE STATEMENT: Pt reports his Rt knee continues to be painful  during full extension and going up stairs.    From initial evaluation:  Pt states he has BIL knee pain (L>R) for over a year at this point, first noticed difficulty with floor transfers and then worsened to the point where the majority of transfers bothered knees to some extent. Particularly bothersome with stairs. He denies any issues with swelling or N/T. He does endorse some chronic ankle swelling bilaterally that he is in communication w/ providers about. We also discuss his complex medical history in depth today, particularly in regard to his back for which he states he will likely be receiving an epidural at some point for. He does mention that he received BIL knee injections ~3 days ago with about 50% relief.   PERTINENT HISTORY: anemia, anxiety, bipolar 1, BPH, basal cell carcinoma, other hx of cancer, CVID, depression, GERD, HTN, PVD, vertigo, thoracic aortic aneurysm, hx thyroidectomy, hx lumbar fusion  PAIN:  Are you having pain: yes Location/description: BIL knees (R>L), anterior knee  NPRS 3/10  - aggravating factors: stair navigation, kneeling, floor transfers, transfers from lower surfaces  - Easing factors: injections, positional changes    PRECAUTIONS: cancer history, aortic aneurysm (no isometrics)  WEIGHT BEARING RESTRICTIONS: No  FALLS:  Has patient fallen in last 6 months? 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 Does have some limitations due to back issues  PATIENT GOALS: would like to be more active  NEXT MD VISIT: TBD, plans to follow with Dr. Allena Katz beginning of April   OBJECTIVE:  Note: Objective measures were completed at Evaluation unless otherwise noted.  DIAGNOSTIC FINDINGS:  Recent MRI (09/29/23) for  lumbar spine, no recent LE imaging in chart - pt states he had XR done through [facility], showed arthritis and edema  in both knees, states they didn't feel he needed an MRI   PATIENT SURVEYS:  LEFS 38/80  COGNITION: Overall cognitive status: Within functional limits for tasks assessed     SENSATION: Does not endorse any sensory complaints today  EDEMA:  Denies issues with knee edema     POSTURE: fwd head, rounded shoulders  PALPATION: Concordant tenderness anterior knees BIL, along joint line and patellar tendons. R is more tender to the touch than the L although the L is reportedly more symptomatic   LOWER EXTREMITY ROM:      Right eval Left eval  Hip flexion    Hip extension    Hip internal rotation    Hip external rotation    Knee extension full Full (passively, does have mild limitation actively against gravity)   Knee flexion 130 deg * 110 deg *  (Blank rows = not tested) (Key: WFL = within functional limits not formally assessed, * = concordant pain, s = stiffness/stretching sensation, NT = not tested)  Comments:    LOWER EXTREMITY MMT:    MMT Right eval Left eval  Hip flexion 4 4-  Hip abduction (modified sitting) 4 4  Hip internal rotation    Hip external rotation    Knee flexion 4+ 4  Knee extension 4+ 4  Ankle dorsiflexion 4 4   (Blank rows = not tested) (Key: WFL = within functional limits not formally assessed, * = concordant pain, s = stiffness/stretching sensation, NT = not tested)  Comments:     FUNCTIONAL TESTS:  5xSTS: 12.7sec no UE support, crepitus BIL, L knee pain  GAIT: Distance walked: within clinic Assistive device utilized: None Level of assistance: Complete Independence Comments: widened BOS, reduced truncal rotation, reduced knee ROM BIL throughout all phases of gait but more prominent on L                                                                                                                                 TREATMENT  DATE:  [facility] Adult PT Treatment:                                                DATE: 11/06/23  Therapeutic Exercise: Upright bike, L7-10 x 6 min for warm up SLR each LE 2 x 10 Bridge with core engaged 2 x 10 Sidelying hip abdct x 10 R/L, repeated on LLE  Seated LAQ 2# x 10 (audible crepitus on RLE) each LE;  x 5 Standing TKE with green band x 5 sec hold x 10 each LE  Seated quad stretch with single foot under chair, sliding hips forward x 15s x 2 each LE Seated hamstring stretch x 20s x 1 each LE, cues for posture   [facility] Adult PT Treatment:  DATE: 11/04/23  Therapeutic Exercise: Upright bike, L7 x 6 min for warm up Seated quad stretch with single foot under chair, sliding hips forward x 20s x 3 each LE Seated hamstring stretch x 20s x 3 each LE, cues for posture STS from elevated table with forward arm reach, hip hinge and core engaged x 8  Bridge with core engaged x 10 SLR x 10 LLE, x 5 RLE STanding knee flexion at counter x 10 each    [facility] Adult PT Treatment:                                                DATE: 10/17/23 Therapeutic Exercise: Seated quad set x10 Standing knee flexion AROM HEP handout + education, relevant anatomy/physiology and rationale for interventions   PATIENT EDUCATION:  Education details:  Person educated: Patient Education method: Programmer, multimedia, Facilities manager, Actor cues, Verbal cues Education comprehension: verbalized understanding, returned demonstration, verbal cues required, tactile cues required, and needs further education    HOME EXERCISE PROGRAM: Access Code: [email or web address] URL: [email or web address]  ASSESSMENT:  CLINICAL IMPRESSION: Pt reported some increase in R knee discomfort LAQ and biking with increased resistance greater than 7. Plan to add TKE with band and sidelying hip abdct to HEP next visit (good tolerance). Will continue to progress as tolerated.  Goals are ongoing.      From Initial evaluation: Patient is a pleasant 68 y.o. gentleman who was seen today for physical therapy evaluation and treatment for bilateral knee pain ongoing over past year, much improved with recent injections. Primarily limited in closed chain activities involving knee flexion such as transfers and stair navigation. On exam he demonstrates concordant limitations in knee mobility and strength, L more affected than R. His 5xSTS time is right at cutoff score for fall risk in majority of populations (12-14sec, varying) and elicits mild knee pain. No adverse events, tolerates exam/HEP well without increase in resting pain. Recommend trial of skilled PT to address aforementioned deficits with aim of improving functional tolerance and reducing pain with typical activities. Pt departs today's session in no acute distress, all voiced concerns/questions addressed appropriately from PT perspective.       OBJECTIVE IMPAIRMENTS: Abnormal gait, decreased activity tolerance, decreased endurance, decreased mobility, difficulty walking, decreased ROM, decreased strength, postural dysfunction, and pain.   ACTIVITY LIMITATIONS: standing, squatting, stairs, transfers, and locomotion level  PARTICIPATION LIMITATIONS: meal prep, cleaning, laundry, and community activity  PERSONAL FACTORS: Age, Time since onset of injury/illness/exacerbation, and 3+ comorbidities: anemia, anxiety, bipolar 1, BPH, basal cell carcinoma, other hx of cancer, CVID, depression, GERD, HTN, PVD, vertigo, thoracic aortic aneurysm, hx thyroidectomy, hx lumbar fusion  are also affecting patient's functional outcome.   REHAB POTENTIAL: Fair given chronicity and comorbidities  CLINICAL DECISION MAKING: Evolving/moderate complexity  EVALUATION COMPLEXITY: Moderate   GOALS:   SHORT TERM GOALS: Target date: 11/14/2023  Pt will demonstrate appropriate understanding and performance of initially prescribed HEP in order to facilitate  improved independence with management of symptoms.  Baseline: HEP established  Goal status: INITIAL   2. Pt will report at least 25% improvement in overall pain levels over past week in order to facilitate improved tolerance to typical daily activities.   Baseline: 0-8/10  Goal status: INITIAL    LONG TERM GOALS: Target date: 12/12/2023  Pt will score 50/80 or greater on LEFS in order to demonstrate  improved perception of function due to symptoms (MCID 9 pts) Baseline: 38/80 Goal status: INITIAL  2.  Pt will demonstrate at least 120 degrees of knee flexion AROM bilaterally in order to facilitate improved tolerance to gait/transfers.  Baseline: see ROM chart above Goal status: INITIAL  3.  Pt will report at least 50% decrease in overall pain levels in past week in order to facilitate improved tolerance to basic ADLs/mobility.   Baseline: 0-8/10  Goal status: INITIAL    4.  Pt will be able to perform 5xSTS in less than or equal to 10 in order to demonstrate reduced fall risk and improved functional independence (MCID 5xSTS = 2.3 sec). Baseline: 12sec no UE support Goal status: INITIAL   5. Pt will report/demonstrate ability to navigate at least 13 steps w/o UE support and less than 3 pt increase in pain in order to facilitate improved tolerance to home navigation  Baseline: pain with stair navigation  Goal status: INITIAL   PLAN:  PT FREQUENCY: 1-2x/week  PT DURATION: 8 weeks  PLANNED INTERVENTIONS: 97164- PT Re-evaluation, 97110-Therapeutic exercises, 97530- Therapeutic activity, 97112- Neuromuscular re-education, 97535- Self Care, 16109- Manual therapy, [id number]- Gait training, [phone]- Aquatic Therapy, Patient/Family education, Balance training, Stair training, Taping, Dry Needling, Joint mobilization, Cryotherapy, and Moist heat  PLAN FOR NEXT SESSION: Review/update HEP PRN. Work on [employer] exercises as appropriate with emphasis on quad activation, knee flexion mobility  (especially on L), closed chain stability and functional mechanics. Symptom modification strategies as indicated/appropriate. Mindful of complex medical history including aortic aneurysm, cancer.  Mayer Camel, PTA 11/06/23 2:35 PM [facility] [address] [place], [name], [address] Phone: [phone]   Fax:  [phone]

## 2023-11-07 ENCOUNTER — Ambulatory Visit (HOSPITAL_COMMUNITY)
Admission: RE | Admit: 2023-11-07 | Discharge: 2023-11-07 | Disposition: A | Discharge status: Home / Self Care | Source: Ambulatory Visit | Attending: Vascular Surgery | Admitting: Vascular Surgery

## 2023-11-07 DIAGNOSIS — I872 Venous insufficiency (chronic) (peripheral): Secondary | ICD-10-CM | POA: Diagnosis not present

## 2023-11-10 ENCOUNTER — Encounter (HOSPITAL_COMMUNITY): Payer: Self-pay | Admitting: Licensed Clinical Social Worker

## 2023-11-10 DIAGNOSIS — M48062 Spinal stenosis, lumbar region with neurogenic claudication: Secondary | ICD-10-CM | POA: Diagnosis not present

## 2023-11-10 NOTE — Therapy (Signed)
 OUTPATIENT PHYSICAL THERAPY LOWER EXTREMITY TREATMENT   Patient Name: Alex [name] MRN: [id number] DOB:[birth year], 68 y.o., male Today's Date: 11/11/2023  END OF SESSION:  PT End of Session - 11/11/23 1347     Visit Number 4    Number of Visits 17    Date for PT Re-Evaluation 12/12/23    Authorization Type BCBS    PT Start Time 1347    PT Stop Time 1427    PT Time Calculation (min) 40 min    Activity Tolerance Patient tolerated treatment well              Past Medical History:  Diagnosis Date   Anemia    Anxiety    Atypical nevus 04/12/1997   dyplastic-left chest below nipple   Atypical nevus 01/18/2005   slight-mod-mid upper abd, slight-mod-right lateral chest-(WS), slight-mod-mid lower back (punch)   Atypical nevus 05/31/2005   dysplastic-central lowerback (exc), dysplastic- right abdomen (Exc)   Basal cell carcinoma 06/04/2016   back of neck   Bipolar I disorder (HCC)    Bleeding ulcer 2016   BPH (benign prostatic hypertrophy) with urinary obstruction    Cancer (HCC)    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 Burna Cash, oncology. Receives monthly IVIG 40 grams.   Depression    Epicondylitis    right elbow   GERD (gastroesophageal reflux disease)    Glaucoma BOTH EYES   RIGHT EYE RADIATION DAMAGE   Hearing loss    Bilateral   Hepatic cyst    Several, The lesion of concern in segment 6 of the liver has single large portal vein and hepatic vein branches  extending to tt, in a pattern of enhancement which mirrors these vascular structures. 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 9 CM X 4. 3 CM PER NOV 06-14-17  CHEST CTFOLOWED BY DR CRENSHAW YEARLY FOR   Positional vertigo    HX OF WITH SINUS INFECTIONS  Prostate cancer [facility])    Follows w/ Dr. Maisie Fus Polascik @ [facility].   Radial head fracture    Right   Squamous cell carcinoma of skin 06/04/2016   in situ-crown of scalp   Squamous cell carcinoma of skin 04/22/2017   in situ-crown scalp (txpbx)   Thoracic aortic aneurysm (HCC) 06/14/2017   last CT 4.1 CM Mild   Tinnitus    CONSTANT   Ulnar nerve compression    right elbow   Unsteady gait    especially with stairs, depth perception off   Urinary hesitancy    Wears glasses    Past Surgical History:  Procedure Laterality Date   CARDIAC CATHETERIZATION  01-16-2006  DR Maisie Fus WALL   MILD CORONARY ATHEROSCLEROSIS/ MID TO DISTAL LAD 40% STENOSIS/ LVF 50-55%   CARPAL TUNNEL RELEASE Right 11/03/2017   Procedure: RIGHT HAND CARPAL TUNNEL RELEASE;  Surgeon: Bradly Bienenstock, MD;  Location: [facility];   Service: Orthopedics;  Laterality: Right;   CATARACT EXTRACTION Right    COLONSCOPY  2017 LAST DONE   MULTIPLE   CYST EXCISION Right 09/04/2023   Procedure: EXCISION CYST RIGHT SHOULDER;  Surgeon: Darnell Level, MD;  Location: [facility] [place];  Service: General;  Laterality: Right;  LOCAL & MAC   ENDOSCOPY  LAST 2017   MULTIPLE DONE DILATION DONE ALSO   ESOPHAGOGASTRODUODENOSCOPY (EGD) WITH PROPOFOL N/A 02/24/2018   Procedure: ESOPHAGOGASTRODUODENOSCOPY (EGD) WITH PROPOFOL;  Surgeon: Carman Ching, MD;  Location: [facility];  Service: Endoscopy;  Laterality: N/A;   EXCISION RADIAL HEAD Right 11/03/2017   Procedure: RIGHT PROXIMAL RADIUS RADIAL HEAD RESECTION AND JOINT DEBRIDEMENT;  Surgeon: Bradly Bienenstock, MD;  Location: [facility];  Service: Orthopedics;  Laterality: Right;   EXTRACORPOREAL SHOCK WAVE LITHOTRIPSY Right 11/20/2020   Procedure: EXTRACORPOREAL SHOCK WAVE LITHOTRIPSY (ESWL);  Surgeon: Sebastian Ache, MD;  Location: [facility];  Service: Urology;  Laterality: Right;  75 MINS   KNEE ARTHROSCOPY  05/01/2012   Procedure: ARTHROSCOPY KNEE;  Surgeon: Javier Docker, MD;  Location: [facility];  Service: Orthopedics;  Laterality: Left;  debridement and removal of loose body   LEFT ANKLE ARTHROSCOPY W/ DEBRIDEMENT  05/12/2007   LEFT HYDROCELECTOMY  03/29/2005   AND REPAIR LEFT INGUINAL HERNIA W/ MESH   MOHS SURGERY  2002   RIGHT ORBITAL CANCER   NASAL ENDOSCOPY  08/07/2005   RIGHT EPISTAXIS  / POST SEPTOPLASTY  (HX RIGHT ORBITAL CA & S/P RADIATION/ NECROSIS ANTERIOR END OF BOTH INFERIOR TURBINATES)   occuloplastic surgery  2002   PARS PLANA VITRECTOMY  11/06/2004   RIGHT EYE RADIATION RETINOPATHY W/ HEMORRHAGE   PROSTATE ABLATION N/A 06/2022   RADIAL HEAD ARTHROPLASTY Right 06/15/2018   Procedure: RIGHT ELBOW PROXIMAL RADIOULNAR JOINT DEBRIDEMENT AND ARTHROPLASTY;  Surgeon: Bradly Bienenstock, MD;  Location: [facility];  Service: Orthopedics;  Laterality: Right;  BLOCK WITH SEDATION   REPAIR UNDESENDED RIGHT TESTICLE / RIGHT INGUINAL HERNIA  AGE 80   RIGHT ANKLE ARTHROSCOPY W/ EXTENSIVE DEBRIDEMENT  04/05/2008   x2   RIGHT SHOULDER SURGERY  2006   RIGHT SUPRAOMOHYOID NECK DISSECTION   03/08/2003   ZONES 1,2,3;   SUBMANDIBULAR MASS / METASTATIC SQUAMOUS CELL CARCINOMA RIGHT NECK   SAVORY DILATION N/A 02/24/2018   Procedure: SAVORY DILATION;  Surgeon: Carman Ching, MD;  Location: [facility];  Service: Endoscopy;  Laterality: N/A;   SEPTOPLASTY  06/2005   SHOULDER ARTHROSCOPY Left    SHOULDER ARTHROSCOPY  W/ SUBACROMIAL DECOMPRESSION AND DISTAL CLAVICLE EXCISION  10/09/2008   AND DEBRIDEMENT OF RIGHT SHOULDER IMPINGEMENT & AC JOINT ARTHRITIS   SPINE SURGERY  2016   l 3 TO l 4 PLATE AND SCREWS   TOTAL THYROIDECTOMY  11/03/2001   PAPILLARY THYROID CARCINOMA   TRANSTHORACIC ECHOCARDIOGRAM  07/2011   grade I diastolic dysfunction/ ef 55-60%   ULNAR NERVE TRANSPOSITION Right 04/28/2014   Procedure: RIGHT ELBOW ULNA NERVE RELEASE TRANSPOSTION AND MEDIAL EPICONDYLAR DEBRIDEMENT AND REPAIR;  Surgeon: Sharma Covert, MD;  Location: [facility];  Service: Orthopedics;  Laterality: Right;   Patient Active Problem List   Diagnosis Date Noted   Primary osteoarthritis of both knees 09/22/2023   Boil 08/26/2023   Abscess 08/20/2023   Cellulitis 05/12/2023   Bruise of lower lip 04/17/2023   Wheezing 03/31/2023   Injury of medial collateral ligament (MCL) of knee 02/07/2023   Sprain of medial collateral ligament of right knee 02/07/2023   Osteoarthritis of subtalar joint 09/25/2022   Cystoid macular edema of right eye 07/18/2022   Hyperglycemia 07/18/2022   Nonexudative age-related macular degeneration, left eye, early dry stage 07/18/2022   Retinal edema 07/18/2022   Superficial punctate keratitis of right eye 07/18/2022   Grief at loss of child 05/22/2022   Actinic keratoses 05/22/2022    Pain in joint of left elbow 04/04/2022   Abnormal defecation 02/26/2022   Atrophic gastritis 02/26/2022   Bipolar 1 disorder (HCC) 02/26/2022   BPH (benign prostatic hyperplasia) 02/26/2022   Bronchiolectasis (HCC) 02/26/2022   Disease of thyroid gland 02/26/2022   Diverticular disease of colon 02/26/2022   Family history of malignant neoplasm of digestive organs 02/26/2022   Hyperlipidemia 02/26/2022   IgG deficiency (HCC) 02/26/2022   Immune deficiency disorder (HCC) 02/26/2022   Irritable bowel syndrome 02/26/2022   Pancreatic cyst 02/26/2022   History of colonic polyps 02/26/2022   Portosystemic shunt, 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/26/2022   Pyloric ulcer 02/26/2022   Scoliosis 02/26/2022   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   Spinal stenosis of lumbar region 02/26/2022   Prostate cancer (HCC) 02/26/2022   Dysuria 10/03/2021   Earache 06/14/2021   Anemia 06/14/2021   History of head and neck cancer 03/28/2021   Swelling of right parotid gland 03/28/2021   Epistaxis, recurrent 06/26/2020   PMR (polymyalgia rheumatica) (HCC) 02/22/2020   Diverticulitis 01/25/2020   Abdominal pain 01/25/2020   Iliac vessel injury 11/30/2019   Stress at home 09/14/2019   LLQ abdominal pain 09/06/2019   IPMN (intraductal papillary mucinous neoplasm) 07/19/2019   History of thyroid cancer 06/03/2019   Abnormal findings on diagnostic imaging of liver 06/03/2019   Epididymitis 03/16/2019   Difficulty urinating 03/16/2019   Pain in right foot 03/12/2019   Hypothyroidism (acquired) 09/29/2018   Moderate persistent asthma with acute exacerbation 09/29/2018   Prediabetes 09/29/2018   RTI (respiratory tract infection) 09/29/2018   ETD (Eustachian tube dysfunction), bilateral 09/02/2018   Sensorineural hearing loss (SNHL) of both ears 07/31/2018   Bruising 11/17/2017   Encounter for other orthopedic aftercare 11/14/2017   Cicatricial lagophthalmos of left lower eyelid  10/31/2017   Closed fracture of head of right radius 10/24/2017   Palatal mass 09/16/2017   Sore throat 09/16/2017   Testicular pain, right 08/01/2017   Combined form of age-related cataract, left eye 05/19/2017   Dry eye syndrome of both eyes 05/19/2017   Primary open angle glaucoma (POAG) of left eye, mild stage 05/19/2017   Radiation retinopathy,  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   Secondary glaucoma due to combination mechanisms, right, severe stage 05/19/2017   Fatigue 01/21/2017   Ankle pain, left 01/21/2017   Fall (on) (from) other stairs and steps, initial encounter 11/11/2016   Abrasion of head 11/11/2016   Ascending aortic aneurysm (HCC) 07/23/2016   Erectile dysfunction 07/19/2016   Bronchiectasis (HCC) 04/23/2016   Chronic bronchitis (HCC) 03/11/2016   Cerumen impaction 08/04/2015   Bilateral impacted cerumen 08/04/2015   Cervical disc disorder with radiculopathy of cervical region 06/06/2014   Elevated WBC count 06/06/2014   Iron deficiency anemia 06/06/2014   Edema 02/21/2014   Tinnitus 02/21/2014   Well adult exam 05/31/2013   Glaucoma 05/31/2013   RML pneumonia 03/31/2013   Hypertension, uncontrolled 02/25/2013   Anxiety disorder 08/08/2010   Chest pain, atypical 02/08/2010   ESOPHAGEAL STRICTURE 11/30/2009   Osteoarthritis 08/07/2009   Malaise and fatigue 05/14/2008   Coronary atherosclerosis 11/04/2007   Lumbago 11/04/2007   THYROIDECTOMY, HX OF 11/04/2007   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    PCP: Tresa Garter, MD  REFERRING PROVIDER: Guillermina City, PA-C  REFERRING DIAG: [phone] (ICD-10-CM) - Right knee pain M25.562 (ICD-10-CM) - Left knee pain R60.0 Bilateral edema of lower extremity  THERAPY DIAG:  Chronic pain of both knees  Other abnormalities of gait and mobility  Muscle weakness (generalized)  Rationale for Evaluation and Treatment: Rehabilitation  ONSET DATE: over a year ago  SUBJECTIVE:   SUBJECTIVE  STATEMENT: 11/11/2023 states he has been doing okay. Got an injection in his back yesterday. Still having a lot of pain with transfers and stairs. Felt sidelying exercises irritated his back.     From initial evaluation:  Pt states he has BIL knee pain (L>R) for over a year at this point, first noticed difficulty with floor transfers and then worsened to the point where the majority of transfers bothered knees to some extent. Particularly bothersome with stairs. He denies any issues with swelling or N/T. He does endorse some chronic ankle swelling bilaterally that he is in communication w/ providers about. We also discuss his complex medical history in depth today, particularly in regard to his back for which he states he will likely be receiving an epidural at some point for. He does mention that he received BIL knee injections ~3 days ago with about 50% relief.   PERTINENT HISTORY: anemia, anxiety, bipolar 1, BPH, basal cell carcinoma, other hx of cancer, CVID, depression, GERD, HTN, PVD, vertigo, thoracic aortic aneurysm, hx thyroidectomy, hx lumbar fusion  PAIN:  Are you having pain: yes Location/description: BIL knees (R>L), anterior knee  NPRS 3/10  - aggravating factors: stair navigation, kneeling, floor transfers, transfers from lower surfaces  - Easing factors: injections, positional changes    PRECAUTIONS: cancer history, aortic aneurysm (no isometrics)  WEIGHT BEARING RESTRICTIONS: No  FALLS:  Has patient fallen in last 6 months? 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 Does have some limitations due to back issues  PATIENT GOALS: would like to be more active  NEXT MD VISIT: TBD, plans to follow with Dr. Allena Katz beginning of  April   OBJECTIVE:  Note: Objective measures were completed at Evaluation unless otherwise noted.  DIAGNOSTIC FINDINGS:  Recent MRI (  09/29/23) for lumbar spine, no recent LE imaging in chart - pt states he had XR done through [facility], showed arthritis and edema in both knees, states they didn't feel he needed an MRI   PATIENT SURVEYS:  LEFS 38/80  COGNITION: Overall cognitive status: Within functional limits for tasks assessed     SENSATION: Does not endorse any sensory complaints today  EDEMA:  Denies issues with knee edema     POSTURE: fwd head, rounded shoulders  PALPATION: Concordant tenderness anterior knees BIL, along joint line and patellar tendons. R is more tender to the touch than the L although the L is reportedly more symptomatic   LOWER EXTREMITY ROM:      Right eval Left eval  Hip flexion    Hip extension    Hip internal rotation    Hip external rotation    Knee extension full Full (passively, does have mild limitation actively against gravity)   Knee flexion 130 deg * 110 deg *  (Blank rows = not tested) (Key: WFL = within functional limits not formally assessed, * = concordant pain, s = stiffness/stretching sensation, NT = not tested)  Comments:    LOWER EXTREMITY MMT:    MMT Right eval Left eval  Hip flexion 4 4-  Hip abduction (modified sitting) 4 4  Hip internal rotation    Hip external rotation    Knee flexion 4+ 4  Knee extension 4+ 4  Ankle dorsiflexion 4 4   (Blank rows = not tested) (Key: WFL = within functional limits not formally assessed, * = concordant pain, s = stiffness/stretching sensation, NT = not tested)  Comments:     FUNCTIONAL TESTS:  5xSTS: 12.7sec no UE support, crepitus BIL, L knee pain  GAIT: Distance walked: within clinic Assistive device utilized: None Level of assistance: Complete Independence Comments: widened BOS, reduced truncal rotation, reduced knee ROM BIL throughout all phases of gait but more prominent  on L                                                                                                                                 TREATMENT DATE:  [facility] Adult PT Treatment:                                                DATE: 11/11/23 Therapeutic Exercise: Standing green band TKE 3x10BIL Green band hamstring curl 2x8 Seated hamstring stretch 3x30 sec BIL w/ stool prop  HEP discussion/education  Neuromuscular re-ed: Unresisted LAQ x12 BIL emphasis on quad contraction 4 inch fwd step up 2x8, UE support and cues for quad control    [facility] Adult PT Treatment:  DATE: 11/06/23  Therapeutic Exercise: Upright bike, L7-10 x 6 min for warm up SLR each LE 2 x 10 Bridge with core engaged 2 x 10 Sidelying hip abdct x 10 R/L, repeated on LLE  Seated LAQ 2# x 10 (audible crepitus on RLE) each LE;  x 5 Standing TKE with green band x 5 sec hold x 10 each LE  Seated quad stretch with single foot under chair, sliding hips forward x 15s x 2 each LE Seated hamstring stretch x 20s x 1 each LE, cues for posture   [facility] Adult PT Treatment:                                                DATE: 11/04/23  Therapeutic Exercise: Upright bike, L7 x 6 min for warm up Seated quad stretch with single foot under chair, sliding hips forward x 20s x 3 each LE Seated hamstring stretch x 20s x 3 each LE, cues for posture STS from elevated table with forward arm reach, hip hinge and core engaged x 8  Bridge with core engaged x 10 SLR x 10 LLE, x 5 RLE STanding knee flexion at counter x 10 each     PATIENT EDUCATION:  Education details: rationale for interventions, HEP  Person educated: Patient Education method: Explanation, Demonstration, Tactile cues, Verbal cues Education comprehension: verbalized understanding, returned demonstration, verbal cues required, tactile cues required, and needs further education     HOME EXERCISE PROGRAM: Access Code: [email or web address] URL:  [email or web address]  ASSESSMENT:  CLINICAL IMPRESSION: 11/11/2023 Pt arrives w/ report of baseline pain, no issues after last session. He does report getting an injection in low back yesterday so we focus today more on focal quad/hamstring work which he tolerates well. We do introduce limited ROM step ups with emphasis on quad control which he tolerates well. Reports some mild muscular fatigue in quads as expected but no increase in pain, no adverse events. Recommend continuing along current POC in order to address relevant deficits and improve functional tolerance.Pt departs today's session in no acute distress, all voiced questions/concerns addressed appropriately from PT perspective.     From Initial evaluation: Patient is a pleasant 68 y.o. gentleman who was seen today for physical therapy evaluation and treatment for bilateral knee pain ongoing over past year, much improved with recent injections. Primarily limited in closed chain activities involving knee flexion such as transfers and stair navigation. On exam he demonstrates concordant limitations in knee mobility and strength, L more affected than R. His 5xSTS time is right at cutoff score for fall risk in majority of populations (12-14sec, varying) and elicits mild knee pain. No adverse events, tolerates exam/HEP well without increase in resting pain. Recommend trial of skilled PT to address aforementioned deficits with aim of improving functional tolerance and reducing pain with typical activities. Pt departs today's session in no acute distress, all voiced concerns/questions addressed appropriately from PT perspective.       OBJECTIVE IMPAIRMENTS: Abnormal gait, decreased activity tolerance, decreased endurance, decreased mobility, difficulty walking, decreased ROM, decreased strength, postural dysfunction, and pain.   ACTIVITY LIMITATIONS: standing, squatting, stairs, transfers, and locomotion level  PARTICIPATION LIMITATIONS:  meal prep, cleaning, laundry, and community activity  PERSONAL FACTORS: Age, Time since onset of injury/illness/exacerbation, and 3+ comorbidities: anemia, anxiety, bipolar 1, BPH, basal cell carcinoma, other hx of cancer, CVID, depression, GERD, HTN,  PVD, vertigo, thoracic aortic aneurysm, hx thyroidectomy, hx lumbar fusion  are also affecting patient's functional outcome.   REHAB POTENTIAL: Fair given chronicity and comorbidities  CLINICAL DECISION MAKING: Evolving/moderate complexity  EVALUATION COMPLEXITY: Moderate   GOALS:   SHORT TERM GOALS: Target date: 11/14/2023  Pt will demonstrate appropriate understanding and performance of initially prescribed HEP in order to facilitate improved independence with management of symptoms.  Baseline: HEP established  Goal status: INITIAL   2. Pt will report at least 25% improvement in overall pain levels over past week in order to facilitate improved tolerance to typical daily activities.   Baseline: 0-8/10  Goal status: INITIAL    LONG TERM GOALS: Target date: 12/12/2023  Pt will score 50/80 or greater on LEFS in order to demonstrate improved perception of function due to symptoms (MCID 9 pts) Baseline: 38/80 Goal status: INITIAL  2.  Pt will demonstrate at least 120 degrees of knee flexion AROM bilaterally in order to facilitate improved tolerance to gait/transfers.  Baseline: see ROM chart above Goal status: INITIAL  3.  Pt will report at least 50% decrease in overall pain levels in past week in order to facilitate improved tolerance to basic ADLs/mobility.   Baseline: 0-8/10  Goal status: INITIAL    4.  Pt will be able to perform 5xSTS in less than or equal to 10 in order to demonstrate reduced fall risk and improved functional independence (MCID 5xSTS = 2.3 sec). Baseline: 12sec no UE support Goal status: INITIAL   5. Pt will report/demonstrate ability to navigate at least 13 steps w/o UE support and less than 3 pt increase in  pain in order to facilitate improved tolerance to home navigation  Baseline: pain with stair navigation  Goal status: INITIAL   PLAN:  PT FREQUENCY: 1-2x/week  PT DURATION: 8 weeks  PLANNED INTERVENTIONS: [id number]- PT Re-evaluation, 97110-Therapeutic exercises, 97530- Therapeutic activity, 97112- Neuromuscular re-education, 97535- Self Care, [id number]- Manual therapy, [id number]- Gait training, [phone]- Aquatic Therapy, Patient/Family education, Balance training, Stair training, Taping, Dry Needling, Joint mobilization, Cryotherapy, and Moist heat  PLAN FOR NEXT SESSION: Review/update HEP PRN. Work on [employer] exercises as appropriate with emphasis on quad activation, knee flexion mobility (especially on L), closed chain stability and functional mechanics. Symptom modification strategies as indicated/appropriate. Mindful of complex medical history including aortic aneurysm, cancer. Interested in using gym equipment  Ashley Murrain PT, DPT 11/11/2023 2:30 PM    [facility] [address] [place], [name], [address] Phone: [phone]   Fax:  [phone]

## 2023-11-10 NOTE — Progress Notes (Signed)
 Virtual virtual Video Note  I connected with Lashaun Krapf Leske on 10/30/2023 at 2-3pm EST by video-enabled virtual visit. I verified that I am speaking with the correct person using  two identifiers.I discussed the limitations of evaluation and management by telemedicine and the availability of in person appointments. The patient expressed understanding and agreed to proceed.    LOCATION: Patient: Home  Provider: Home office   History of Present Illness:  Pt was referred by Dr. Lolly Mustache for OP therapy for bipolar disorder and anxiety.  Treatment Goal Addressed:  Pt will meet with clinician 1x every 2 weeks for therapy to monitor for progress towards goals and address any barriers to success; Reduce depression from average severity level of 6/10 down to a 4/10 in next 6 months by engaging in 1-2 positive coping skills daily as part of developing self-care routine; Reduce average anxiety level from 7/10 down to 5/10 in next 6 months by utilizing 1-2 relaxation skills/grounding skills per day, such as mindful breathing, progressive muscle relaxation, positive visualizations.  Progress towards Treatment Goal: Progressing   Observations/Objective: Patient presented for today's session on time and was alert, oriented x5, with no evidence or self-report of SI/HI or A/V H.  Patient reported ongoing compliance with medication and denied any use of alcohol or illicit substances.  Clinician inquired about patient's current emotional ratings, as well as any significant changes in thoughts, feelings or behavior since previous session. Patient reported scores of  8/10 for depression, 8/10 for anxiety, 5/10 for anger/irritability. [name] and pt explored his emotional ratings, and discussed coping skills for stressors. 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 Clinician utilized MI OARS to reflect and summarize thoughts and feelings about this new normal life he is experiencing. Clinician discussed the importance of focusing on the here and now, what he can control,    Assessment and plan: Counselor will continue to meet with patient to address treatment plan goals. Patient will continue to follow recommendations of providers and imnd implement skills learned in session, and practice between sessions. Diagnosis: Bipolar 1 disorder.    Collaboration of care: Other:   Continue working with providers    Patient/Guardian was advised Release of Information must be obtained prior to any record release in order to collaborate their care with an outside provider. Patient/Guardian was advised if they have not already done so to contact the registration department to sign all necessary forms in order for [place] to release information regarding their care.    Consent: Patient/Guardian gives verbal consent for treatment and assignment of benefits for services provided during this visit. Patient/Guardian expressed understanding and agreed to proceed.     Follow Up Instructions:  I discussed the assessment and treatment plan with the patient. The patient was provided an opportunity to ask questions and all were answered. The patient agreed with the plan and demonstrated an understanding of the instructions.   The patient was advised to call back or seek an in-person evaluation if the symptoms worsen or if the condition fails to improve as anticipated.  I provided 60 minutes of non-face-to-face time during this encounter.   Alistar Mcenery S, LCAS  10/30/23

## 2023-11-11 ENCOUNTER — Encounter (HOSPITAL_BASED_OUTPATIENT_CLINIC_OR_DEPARTMENT_OTHER): Payer: Self-pay | Admitting: Physical Therapy

## 2023-11-11 ENCOUNTER — Ambulatory Visit (HOSPITAL_BASED_OUTPATIENT_CLINIC_OR_DEPARTMENT_OTHER): Attending: Physician Assistant | Admitting: Physical Therapy

## 2023-11-11 DIAGNOSIS — M25562 Pain in left knee: Secondary | ICD-10-CM | POA: Diagnosis not present

## 2023-11-11 DIAGNOSIS — G8929 Other chronic pain: Secondary | ICD-10-CM | POA: Insufficient documentation

## 2023-11-11 DIAGNOSIS — R2689 Other abnormalities of gait and mobility: Secondary | ICD-10-CM | POA: Diagnosis not present

## 2023-11-11 DIAGNOSIS — M170 Bilateral primary osteoarthritis of knee: Secondary | ICD-10-CM | POA: Diagnosis not present

## 2023-11-11 DIAGNOSIS — M25561 Pain in right knee: Secondary | ICD-10-CM | POA: Diagnosis not present

## 2023-11-11 DIAGNOSIS — M6281 Muscle weakness (generalized): Secondary | ICD-10-CM | POA: Insufficient documentation

## 2023-11-12 ENCOUNTER — Encounter (HOSPITAL_COMMUNITY): Payer: Self-pay | Admitting: Licensed Clinical Social Worker

## 2023-11-12 ENCOUNTER — Ambulatory Visit (INDEPENDENT_AMBULATORY_CARE_PROVIDER_SITE_OTHER): Admitting: Licensed Clinical Social Worker

## 2023-11-12 DIAGNOSIS — F319 Bipolar disorder, unspecified: Secondary | ICD-10-CM

## 2023-11-12 NOTE — Progress Notes (Signed)
 Virtual virtual Video Note  I connected with Alex [name] on 11/12/2023 at 2-3pm EST by video-enabled virtual visit. I verified that I am speaking with the correct person using  two identifiers.I discussed the limitations of evaluation and management by telemedicine and the availability of in person appointments. The patient expressed understanding and agreed to proceed.    LOCATION: Patient: Home  Provider: Home office   History of Present Illness:  Pt was referred by Dr. Lolly Mustache for OP therapy for bipolar disorder and anxiety.  Treatment Goal Addressed:  Pt will meet with clinician weekly for therapy to monitor for progress towards goals and address any barriers to success; Reduce depression from average severity level of 6/10 down to a 4/10 in next 6 months by engaging in 1-2 positive coping skills daily as part of developing self-care routine; Reduce average anxiety level from 7/10 down to 5/10 in next 6 months by utilizing 1-2 relaxation skills/grounding skills per day, such as mindful breathing, progressive muscle relaxation, positive visualizations.  Progress towards Treatment Goal: Progressing   Observations/Objective: Patient presented for today'[name] session on time and was alert, oriented x5, with no evidence or self-report of SI/HI or A/V H.  Patient reported ongoing compliance with medication and denied any use of alcohol or illicit substances.  Clinician inquired about patient'[name] current emotional ratings, as well as any significant changes in thoughts, feelings or behavior since previous session. Patient reported scores of  8/10 for depression, 8/10 for anxiety, 3/10 for anger/irritability. [name] and pt explored his emotional ratings, and discussed coping skills for stressors. Pt identifies stressors and  allowed pt to explore and express thoughts and feelings associated with recent life situations and external stressors.[name] and pt reviewed his continued stressors individually: marital  issues, unfair division of martial finances, son'[name] mental health, physical health, future, [name] sexual health after prostate cancer, lack of marital communication. [name] engaged in discussion on identified positives and utilized MI, OARS to assess how these have impacted [name] mood. [name] allowed space for further processing of emotions and provided supportive statements throughout session.   Assessment and plan: Counselor will continue to meet with patient to address treatment plan goals. Patient will continue to follow recommendations of providers and imnd implement skills learned in session, and practice between sessions. Diagnosis: Bipolar 1 disorder.    Collaboration of care: Other:   Continue working with providers    Patient/Guardian was advised Release of Information must be obtained prior to any record release in order to collaborate their care with an outside provider. Patient/Guardian was advised if they have not already done so to contact the registration department to sign all necessary forms in order for [place] to release information regarding their care.    Consent: Patient/Guardian gives verbal consent for treatment and assignment of benefits for services provided during this visit. Patient/Guardian expressed understanding and agreed to proceed.     Follow Up Instructions:  I discussed the assessment and treatment plan with the patient. The patient was provided an opportunity to ask questions and all were answered. The patient agreed with the plan and demonstrated an understanding of the instructions.   The patient was advised to call back or seek an in-person evaluation if the symptoms worsen or if the condition fails to improve as anticipated.  I provided 60 minutes of non-face-to-face time during this encounter.   Alex [name], LCAS  11/12/23

## 2023-11-13 ENCOUNTER — Encounter (HOSPITAL_COMMUNITY): Payer: Self-pay | Admitting: Psychiatry

## 2023-11-13 ENCOUNTER — Telehealth (HOSPITAL_COMMUNITY): Payer: BLUE CROSS/BLUE SHIELD | Admitting: Psychiatry

## 2023-11-13 VITALS — Wt 182.0 lb

## 2023-11-13 DIAGNOSIS — F411 Generalized anxiety disorder: Secondary | ICD-10-CM

## 2023-11-13 DIAGNOSIS — F5101 Primary insomnia: Secondary | ICD-10-CM | POA: Diagnosis not present

## 2023-11-13 DIAGNOSIS — F319 Bipolar disorder, unspecified: Secondary | ICD-10-CM | POA: Diagnosis not present

## 2023-11-13 MED ORDER — GABAPENTIN 100 MG PO CAPS: 100.0000 mg | ORAL_CAPSULE | Freq: Two times a day (BID) | ORAL | 1 refills | Status: DC

## 2023-11-13 MED ORDER — LAMOTRIGINE ER 300 MG PO TB24: 1.0000 | ORAL_TABLET | ORAL | 0 refills | Status: DC

## 2023-11-13 MED ORDER — ZOLPIDEM TARTRATE 10 MG PO TABS: ORAL_TABLET | ORAL | 0 refills | Status: DC

## 2023-11-13 NOTE — Progress Notes (Signed)
 [facility] MD Virtual Progress Note   Patient Location: Home Provider Location: Office  I connect with patient by telephone and verified that I am speaking with correct person by using two identifiers. I discussed the limitations of evaluation and management by telemedicine and the availability of in person appointments. I also discussed with the patient that there may be a patient responsible charge related to this service. The patient expressed understanding and agreed to proceed.  Alex [name] [phone] 68 y.o.  11/13/2023 1:17 PM  History of Present Illness:  Patient is evaluated by phone session.  On the last visit we started him on amitriptyline but patient could not tolerate and could not sleep very well.  Patient told he gets very tired during the day and able to sleep well with the 5 mg Ambien but wake up when he has to go to the bathroom and could not sleep.  He is not trying to take extra 5 mg Ambien which is helping his sleep.  He stopped taking amitriptyline.  He reported chronic stress and anxiety because of family stress and multiple health issues.  He is taking Lamictal which is keeping his mood stable and denies any mania, psychosis, hallucination.  He denies any suicidal thoughts.  He has no rash, itching tremors or shakes.  He is in therapy with Lavada Mesi.  He reported chronic pain specially in his knee joint and he is in process of seeing orthopedic.  Patient told his daughter suggested to go back on gabapentin which he used to take in the past.  Past Psychiatric History: H/O depression, mood swing, anger and Inpatient at [facility] due to suicidal thoughts but no attempt.  No h/o psychosis but h/o poor impulse control.  Tried Klonopin but did not work.  Took Seroquel, Elavil, Trazodone, Doxepin, melatonin, Cymbalta, BuSpar and Remeron (increase depression)     Outpatient Encounter Medications as of 11/13/2023  Medication Sig   acetaminophen (TYLENOL) 500 MG  tablet Take by mouth.   albuterol (VENTOLIN HFA) 108 (90 Base) MCG/ACT inhaler TAKE 2 PUFFS BY MOUTH EVERY 6 HOURS AS NEEDED FOR WHEEZE OR SHORTNESS OF BREATH (Patient not taking: Reported on 10/23/2023)   amitriptyline (ELAVIL) 10 MG tablet Take 1-2 tablets (10-20 mg total) by mouth at bedtime.   amLODipine (NORVASC) 5 MG tablet Take 1 tablet (5 mg total) by mouth daily.   aspirin EC 81 MG tablet Take 1 tablet by mouth daily.   atorvastatin (LIPITOR) 40 MG tablet TAKE 1 TABLET BY MOUTH EVERY DAY   AXIRON 30 MG/ACT SOLN Place 2 Act onto the skin daily.   brimonidine (ALPHAGAN) 0.2 % ophthalmic solution SMARTSIG:In Eye(s)   Coenzyme Q10 (CO Q 10 PO) Take 1 capsule by mouth daily.    EPINEPHrine 0.3 mg/0.3 mL IJ SOAJ injection Inject 0.3 mg into the muscle as needed. (Patient not taking: Reported on 10/23/2023)   erythromycin ophthalmic ointment Place into the right eye at bedtime.   furosemide (LASIX) 20 MG tablet TAKE 1 TO 2 TABLETS BY MOUTH DAILY AS NEEDED (Patient not taking: Reported on 10/23/2023)   Immune Globulin, Human, 4 GM/20ML SOLN Inject into the skin once a week. wednesday   LamoTRIgine 300 MG TB24 24 hour tablet Take 1 tablet (300 mg total) by mouth every morning.   latanoprost (XALATAN) 0.005 % ophthalmic solution 1 DROP EACH EYE AT NIGHT   levothyroxine (SYNTHROID) 150 MCG tablet TAKE 1 TABLET BY MOUTH DAILY BEFORE BREAKFAST.   meloxicam (MOBIC) 15  MG tablet TAKE 1 TABLET BY MOUTH EVERY DAY AS NEEDED   Multiple Vitamin (MULTI-VITAMINS) TABS Take by mouth.   OMEGA-3 KRILL OIL PO Take 1 capsule by mouth daily.    omeprazole (PRILOSEC) 20 MG capsule Take 20 mg by mouth every evening.   sildenafil (REVATIO) 20 MG tablet Take 1 tablet (20 mg total) by mouth as needed.   sulfamethoxazole-trimethoprim (BACTRIM DS) 800-160 MG tablet Take 1 tablet by mouth 2 (two) times daily.   tadalafil (CIALIS) 5 MG tablet Take 5 mg by mouth.   tamsulosin (FLOMAX) 0.4 MG CAPS capsule TAKE 1 CAPSULE BY  MOUTH EVERY DAY (Patient taking differently: as needed.)   telmisartan (MICARDIS) 80 MG tablet Take 1 tablet (80 mg total) by mouth daily.   timolol (TIMOPTIC) 0.5 % ophthalmic solution timolol maleate 0.5 % eye drops  INSTILL 1 DROP INTO RIGHT EYE TWICE A DAY   traMADol (ULTRAM) 50 MG tablet Take 50 mg by mouth as needed.   traMADol (ULTRAM) 50 MG tablet Take 1 tablet (50 mg total) by mouth every 6 (six) hours as needed for moderate pain (pain score 4-6).   triamcinolone ointment (KENALOG) 0.5 % Apply 1 Application topically 4 (four) times daily. On the lip   zolpidem (AMBIEN) 10 MG tablet TAKE 1/2 (ONE-HALF) TABLET BY MOUTH AT BEDTIME AND  TAKE  THE  OTHER  HALF  IF  NEEDED   No facility-administered encounter medications on file as of 11/13/2023.    Recent Results (from the past 2160 hours)  Lab report - scanned     Status: None   Collection Time: 08/19/23 12:00 AM  Result Value Ref Range   EGFR 93.0     Comment: Abstracted by HIM  CBC with Differential/Platelet     Status: Abnormal   Collection Time: 08/20/23 11:04 AM  Result Value Ref Range   WBC 10.0 4.0 - 10.5 K/uL   RBC 5.28 4.22 - 5.81 Mil/uL   Hemoglobin 14.5 13.0 - 17.0 g/dL   HCT 16.1 09.6 - 04.5 %   MCV 83.0 78.0 - 100.0 fl   MCHC 33.1 30.0 - 36.0 g/dL   RDW 40.9 81.1 - 91.4 %   Platelets 275.0 150.0 - 400.0 K/uL   Neutrophils Relative % 68.6 43.0 - 77.0 %   Lymphocytes Relative 12.4 12.0 - 46.0 %   Monocytes Relative 14.3 (H) 3.0 - 12.0 %   Eosinophils Relative 3.9 0.0 - 5.0 %   Basophils Relative 0.8 0.0 - 3.0 %   Neutro Abs 6.8 1.4 - 7.7 K/uL   Lymphs Abs 1.2 0.7 - 4.0 K/uL   Monocytes Absolute 1.4 (H) 0.1 - 1.0 K/uL   Eosinophils Absolute 0.4 0.0 - 0.7 K/uL   Basophils Absolute 0.1 0.0 - 0.1 K/uL  Urine Culture     Status: Abnormal   Collection Time: 08/20/23 11:04 AM   Specimen: Blood  Result Value Ref Range   Source: NOT GIVEN    Status: FINAL    Isolate 1: Proteus mirabilis (A)     Comment: Greater than  100,000 CFU/mL of Proteus mirabilis This organism may show imipenem resistance by mechanisms other than a carbapenemase.      Susceptibility   Proteus mirabilis - URINE CULTURE, REFLEX    AMOX/CLAVULANIC <=2 Sensitive     AMPICILLIN <=2 Sensitive     AMPICILLIN/SULBACTAM <=2 Sensitive     CEFAZOLIN* <=4 Not Reportable      * For infections other than uncomplicated UTI caused by  E. coli, K. pneumoniae or P. mirabilis: Cefazolin is resistant if MIC > or = 8 mcg/mL. (Distinguishing susceptible versus intermediate for isolates with MIC < or = 4 mcg/mL requires additional testing.) For uncomplicated UTI caused by E. coli, K. pneumoniae or P. mirabilis: Cefazolin is susceptible if MIC <32 mcg/mL and predicts susceptible to the oral agents cefaclor, cefdinir, cefpodoxime, cefprozil, cefuroxime, cephalexin and loracarbef.     CEFTAZIDIME <=1 Sensitive     CEFEPIME <=1 Sensitive     CEFTRIAXONE <=1 Sensitive     CIPROFLOXACIN <=0.25 Sensitive     LEVOFLOXACIN <=0.12 Sensitive     GENTAMICIN <=1 Sensitive     IMIPENEM 2 Intermediate     NITROFURANTOIN 128 Resistant     PIP/TAZO <=4 Sensitive     TOBRAMYCIN <=1 Sensitive     TRIMETH/SULFA* <=20 Sensitive      * For infections other than uncomplicated UTI caused by E. coli, K. pneumoniae or P. mirabilis: Cefazolin is resistant if MIC > or = 8 mcg/mL. (Distinguishing susceptible versus intermediate for isolates with MIC < or = 4 mcg/mL requires additional testing.) For uncomplicated UTI caused by E. coli, K. pneumoniae or P. mirabilis: Cefazolin is susceptible if MIC <32 mcg/mL and predicts susceptible to the oral agents cefaclor, cefdinir, cefpodoxime, cefprozil, cefuroxime, cephalexin and loracarbef. Legend: S = Susceptible  I = Intermediate R = Resistant  NS = Not susceptible SDD = Susceptible Dose Dependent * = Not Tested  NR = Not Reported **NN = See Therapy Comments   Basic metabolic panel     Status: None   Collection  Time: 08/20/23 11:04 AM  Result Value Ref Range   Sodium 136 135 - 145 mEq/L   Potassium 4.2 3.5 - 5.1 mEq/L   Chloride 101 96 - 112 mEq/L   CO2 29 19 - 32 mEq/L   Glucose, Bld 91 70 - 99 mg/dL   BUN 22 6 - 23 mg/dL   Creatinine, Ser 9.60 0.40 - 1.50 mg/dL   GFR 45.40 >98.11 mL/min    Comment: Calculated using the CKD-EPI Creatinine Equation (2021)   Calcium 8.8 8.4 - 10.5 mg/dL  Uric acid     Status: Abnormal   Collection Time: 08/26/23  4:00 PM  Result Value Ref Range   Uric Acid, Serum 3.1 (L) 4.0 - 7.8 mg/dL  Rheumatoid factor     Status: None   Collection Time: 08/26/23  4:00 PM  Result Value Ref Range   Rheumatoid fact SerPl-aCnc <10 <14 IU/mL  PSA     Status: None   Collection Time: 08/26/23  4:00 PM  Result Value Ref Range   PSA 3.52 0.10 - 4.00 ng/mL    Comment: Test performed using Access Hybritech PSA Assay, a parmagnetic partical, chemiluminecent immunoassay.  T4, free     Status: None   Collection Time: 08/26/23  4:00 PM  Result Value Ref Range   Free T4 1.11 0.60 - 1.60 ng/dL    Comment: Specimens from patients who are undergoing biotin therapy and /or ingesting biotin supplements may contain high levels of biotin.  The higher biotin concentration in these specimens interferes with this Free T4 assay.  Specimens that contain high levels  of biotin may cause false high results for this Free T4 assay.  Please interpret results in light of the total clinical presentation of the patient.    Comprehensive metabolic panel     Status: None   Collection Time: 08/26/23  4:00 PM  Result Value Ref  Range   Sodium 137 135 - 145 mEq/L   Potassium 4.3 3.5 - 5.1 mEq/L   Chloride 102 96 - 112 mEq/L   CO2 24 19 - 32 mEq/L   Glucose, Bld 92 70 - 99 mg/dL   BUN 22 6 - 23 mg/dL   Creatinine, Ser 1.61 0.40 - 1.50 mg/dL   Total Bilirubin 0.5 0.2 - 1.2 mg/dL   Alkaline Phosphatase 110 39 - 117 U/L   AST 32 0 - 37 U/L   ALT 36 0 - 53 U/L   Total Protein 6.9 6.0 - 8.3 g/dL    Albumin 4.5 3.5 - 5.2 g/dL   GFR 09.60 >45.40 mL/min    Comment: Calculated using the CKD-EPI Creatinine Equation (2021)   Calcium 8.9 8.4 - 10.5 mg/dL  Lipid panel     Status: Abnormal   Collection Time: 08/26/23  4:00 PM  Result Value Ref Range   Cholesterol 186 0 - 200 mg/dL    Comment: ATP III Classification       Desirable:  < 200 mg/dL               Borderline High:  200 - 239 mg/dL          High:  > = 981 mg/dL   Triglycerides 191.4 (H) 0.0 - 149.0 mg/dL    Comment: Normal:  <782 mg/dLBorderline High:  150 - 199 mg/dL   HDL 95.62 >13.08 mg/dL   VLDL 65.7 (H) 0.0 - 84.6 mg/dL   LDL Cholesterol 72 0 - 99 mg/dL   Total CHOL/HDL Ratio 3     Comment:                Men          Women1/2 Average Risk     3.4          3.3Average Risk          5.0          4.42X Average Risk          9.6          7.13X Average Risk          15.0          11.0                       NonHDL 124.96     Comment: NOTE:  Non-HDL goal should be 30 mg/dL higher than patient's LDL goal (i.e. LDL goal of < 70 mg/dL, would have non-HDL goal of < 100 mg/dL)  CBC with Differential/Platelet     Status: Abnormal   Collection Time: 08/26/23  4:00 PM  Result Value Ref Range   WBC 8.1 4.0 - 10.5 K/uL   RBC 5.35 4.22 - 5.81 Mil/uL   Hemoglobin 14.4 13.0 - 17.0 g/dL   HCT 96.2 95.2 - 84.1 %   MCV 83.4 78.0 - 100.0 fl   MCHC 32.2 30.0 - 36.0 g/dL   RDW 32.4 40.1 - 02.7 %   Platelets 293.0 150.0 - 400.0 K/uL   Neutrophils Relative % 65.0 43.0 - 77.0 %   Lymphocytes Relative 18.8 12.0 - 46.0 %   Monocytes Relative 12.4 (H) 3.0 - 12.0 %   Eosinophils Relative 2.8 0.0 - 5.0 %   Basophils Relative 1.0 0.0 - 3.0 %   Neutro Abs 5.3 1.4 - 7.7 K/uL   Lymphs Abs 1.5 0.7 - 4.0 K/uL   Monocytes Absolute 1.0  0.1 - 1.0 K/uL   Eosinophils Absolute 0.2 0.0 - 0.7 K/uL   Basophils Absolute 0.1 0.0 - 0.1 K/uL  Urinalysis     Status: Abnormal   Collection Time: 08/26/23  4:00 PM  Result Value Ref Range   Color, Urine YELLOW  Yellow;Lt. Yellow;Straw;Dark Yellow;Amber;Green;Red;Brown   APPearance CLEAR Clear;Turbid;Slightly Cloudy;Cloudy   Specific Gravity, Urine >=1.030 (A) 1.000 - 1.030   pH 6.0 5.0 - 8.0   Total Protein, Urine NEGATIVE Negative   Urine Glucose NEGATIVE Negative   Ketones, ur NEGATIVE Negative   Bilirubin Urine NEGATIVE Negative   Hgb urine dipstick NEGATIVE Negative   Urobilinogen, UA 0.2 0.0 - 1.0   Leukocytes,Ua NEGATIVE Negative   Nitrite NEGATIVE Negative  TSH     Status: None   Collection Time: 08/26/23  4:00 PM  Result Value Ref Range   TSH 0.70 0.35 - 5.50 uIU/mL  Surgical pathology     Status: None   Collection Time: 09/04/23 12:11 PM  Result Value Ref Range   SURGICAL PATHOLOGY      SURGICAL PATHOLOGY CASE: [id number] PATIENT: Lauro Regulus Surgical Pathology Report     Clinical History: Cystic mass right shoulder with abscess (crm)     FINAL MICROSCOPIC DIAGNOSIS:  A. CYST, RIGHT SHOULDER WITH ABSCESS, EXCISION:      Skin with subcutaneous acute inflammation, cystic abscess formation and giant cell reactions.      Negative for malignancy.   GROSS DESCRIPTION:  Received in formalin is a 2.2 x 1 cm ellipse of skin and subcutaneous tissue excised to depth of up to 1 cm.  There is a 0.5 cm subcutaneous cyst with surrounding fibrosis.  Sections are submitted in 1 cassette. [facility] 09/04/2023)   Final Diagnosis performed by Lance Coon, MD.   Electronically signed 09/05/2023 Technical component performed at [facility], [address], [place], [name] [address].  Professional component performed at [employer]. [facility], [address] [place], [name] [address].  Immunohistochemistry Technical component (if  applicable) was performed at [facility]. [address], [place], [name] [address].   IMMUNOHISTOCHEMISTRY DISCLAIMER (if applicable): Some of these immunohistochemical stains may have been  developed and the performance characteristics determine by [facility]. Some may not have been cleared or approved by the U.S. Food and Drug Administration. The FDA has determined that such clearance or approval is not necessary. This test is used for clinical purposes. It should not be regarded as investigational or for research. This laboratory is certified under the Clinical Laboratory Improvement Amendments of 1988 (CLIA-88) as qualified to perform high complexity clinical laboratory testing.  The controls stained appropriately.   IHC stains are performed on formalin fixed, paraffin embedded tissue using a 3,3"diaminobenzidine (DAB) chromogen and Leica Bond Autostainer System. The staining intensity of the nucleus is score manually and is  reported as the percentage of tumor cell nuclei demonstrating specific nuclear staining. The specimens are fixed in 10% Neutral Formalin for at least 6 hours and up to 72hrs. These tests are validated on decalcified tissue. Results should be interpreted with caution given the possibility of false negative results on decalcified specimens. Antibody Clones are as follows ER-clone 21F, PR-clone 16, Ki67- clone MM1. Some of these immunohistochemical stains may have been developed and the performance characteristics determined by [facility].      Psychiatric Specialty Exam: Physical Exam  Review of Systems  Musculoskeletal:        Knee pain  Weight 182 lb (82.6 kg).There is no height or weight on file to calculate BMI.  General Appearance: NA  Eye Contact:  NA  Speech:  Slow  Volume:  Decreased  Mood:  Anxious  Affect:  NA  Thought Process:  Descriptions of Associations: Intact  Orientation:  Full (Time, Place, and Person)  Thought Content:  Rumination  Suicidal Thoughts:  No  Homicidal Thoughts:  No  Memory:  Immediate;   Good Recent;   Good Remote;   Fair  Judgement:  Intact  Insight:  Present  Psychomotor  Activity:  Decreased  Concentration:  Concentration: Good and Attention Span: Fair  Recall:  Good  Fund of Knowledge:  Good  Language:  Good  Akathisia:  No  Handed:  Right  AIMS (if indicated):     Assets:  Communication Skills Desire for Improvement Housing Transportation  ADL's:  Intact  Cognition:  WNL  Sleep:  fair     Assessment/Plan: Bipolar I disorder (HCC) - Plan: LamoTRIgine 300 MG TB24 24 hour tablet  GAD (generalized anxiety disorder) - Plan: LamoTRIgine 300 MG TB24 24 hour tablet, zolpidem (AMBIEN) 10 MG tablet, gabapentin (NEURONTIN) 100 MG capsule  Primary insomnia - Plan: zolpidem (AMBIEN) 10 MG tablet  I reviewed current medication.  Patient is not taking amitriptyline because he could not tolerate and it did not work.  Patient has chronic back pain, hypertension, macular degeneration/edema and coronary artery sclerosis.  Discussed chronic anxiety and insomnia.  Will discontinue amitriptyline.  I recommend that going back to gabapentin may be a better choice since it can help his chronic pain.  Patient agreed to give a try.  We will try gabapentin 100 mg twice a day to help his anxiety and if it make him sleepy then he can take in the nighttime.  Patient like to eventually come off from Ambien.  He has not taken Ativan and he agree not to continue Ativan if the gabapentin works.  Encouraged to continue therapy with Lavada Mesi.  In the past he had tried BuSpar which did not work.  He also tried trazodone, doxepin, melatonin.  The other option is to go back to low-dose Cymbalta to help his anxiety but patient preferred to try gabapentin so he can get benefit for pain and anxiety.  Will follow-up in 2 months but encouraged to call us back if is any question.  Discussed medication side effects and benefits.  Follow Up Instructions:     I discussed the assessment and treatment plan with the patient. The patient was provided an opportunity to ask questions and all were  answered. The patient agreed with the plan and demonstrated an understanding of the instructions.   The patient was advised to call back or seek an in-person evaluation if the symptoms worsen or if the condition fails to improve as anticipated.    Collaboration of Care: Other provider involved in patient's care AEB notes are available in epic to review  Patient/Guardian was advised Release of Information must be obtained prior to any record release in order to collaborate their care with an outside provider. Patient/Guardian was advised if they have not already done so to contact the registration department to sign all necessary forms in order for [place] to release information regarding their care.   Consent: Patient/Guardian gives verbal consent for treatment and assignment of benefits for services provided during this visit. Patient/Guardian expressed understanding and agreed to proceed.     I provided 21 minutes of non face  to face time during this encounter.  Note: This document was prepared by [employer] voice dictation technology and any errors that results from this process are unintentional.    Cleotis Nipper, MD 11/13/2023

## 2023-11-13 NOTE — Therapy (Signed)
 OUTPATIENT PHYSICAL THERAPY LOWER EXTREMITY TREATMENT   Patient Name: Alex [name] MRN: [id number] DOB:19-Mar-1956, 68 y.o., male Today's Date: 11/14/2023  END OF SESSION:  PT End of Session - 11/14/23 1348     Visit Number 5    Number of Visits 17    Date for PT Re-Evaluation 12/12/23    Authorization Type BCBS    PT Start Time 1348    PT Stop Time 1428    PT Time Calculation (min) 40 min    Activity Tolerance Patient tolerated treatment well               Past Medical History:  Diagnosis Date   Anemia    Anxiety    Atypical nevus 04/12/1997   dyplastic-left chest below nipple   Atypical nevus 01/18/2005   slight-mod-mid upper abd, slight-mod-right lateral chest-(WS), slight-mod-mid lower back (punch)   Atypical nevus 05/31/2005   dysplastic-central lowerback (exc), dysplastic- right abdomen (Exc)   Basal cell carcinoma 06/04/2016   back of neck   Bipolar I disorder (HCC)    Bleeding ulcer 2016   BPH (benign prostatic hypertrophy) with urinary obstruction    Cancer (HCC)    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 Burna Cash, oncology. Receives monthly IVIG 40 grams.   Depression    Epicondylitis    right elbow   GERD (gastroesophageal reflux disease)    Glaucoma BOTH EYES   RIGHT EYE RADIATION DAMAGE   Hearing loss    Bilateral   Hepatic cyst    Several, The lesion of concern in segment 6 of the liver has single large portal vein and hepatic vein branches  extending to tt, in a pattern of enhancement which mirrors these vascular structures. 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 9 CM X 4. 3 CM PER NOV 06-14-17  CHEST CTFOLOWED BY DR CRENSHAW YEARLY FOR   Positional vertigo    HX OF WITH SINUS  INFECTIONS   Prostate cancer [facility])    Follows w/ Dr. Maisie Fus Polascik @ [facility].   Radial head fracture    Right   Squamous cell carcinoma of skin 06/04/2016   in situ-crown of scalp   Squamous cell carcinoma of skin 04/22/2017   in situ-crown scalp (txpbx)   Thoracic aortic aneurysm (HCC) 06/14/2017   last CT 4.1 CM Mild   Tinnitus    CONSTANT   Ulnar nerve compression    right elbow   Unsteady gait    especially with stairs, depth perception off   Urinary hesitancy    Wears glasses    Past Surgical History:  Procedure Laterality Date   CARDIAC CATHETERIZATION  01-16-2006  DR Maisie Fus WALL   MILD CORONARY ATHEROSCLEROSIS/ MID TO DISTAL LAD 40% STENOSIS/ LVF 50-55%   CARPAL TUNNEL RELEASE Right 11/03/2017   Procedure: RIGHT HAND CARPAL TUNNEL RELEASE;  Surgeon: Bradly Bienenstock, MD;  Location: [facility];   Service: Orthopedics;  Laterality: Right;   CATARACT EXTRACTION Right    COLONSCOPY  2017 LAST DONE   MULTIPLE   CYST EXCISION Right 09/04/2023   Procedure: EXCISION CYST RIGHT SHOULDER;  Surgeon: Darnell Level, MD;  Location: [facility];  Service: General;  Laterality: Right;  LOCAL & MAC   ENDOSCOPY  LAST 2017   MULTIPLE DONE DILATION DONE ALSO   ESOPHAGOGASTRODUODENOSCOPY (EGD) WITH PROPOFOL N/A 02/24/2018   Procedure: ESOPHAGOGASTRODUODENOSCOPY (EGD) WITH PROPOFOL;  Surgeon: Carman Ching, MD;  Location: [facility];  Service: Endoscopy;  Laterality: N/A;   EXCISION RADIAL HEAD Right 11/03/2017   Procedure: RIGHT PROXIMAL RADIUS RADIAL HEAD RESECTION AND JOINT DEBRIDEMENT;  Surgeon: Bradly Bienenstock, MD;  Location: [facility] [place];  Service: Orthopedics;  Laterality: Right;   EXTRACORPOREAL SHOCK WAVE LITHOTRIPSY Right 11/20/2020   Procedure: EXTRACORPOREAL SHOCK WAVE LITHOTRIPSY (ESWL);  Surgeon: Sebastian Ache, MD;  Location: [facility];  Service: Urology;  Laterality: Right;  75 MINS   KNEE ARTHROSCOPY  05/01/2012   Procedure: ARTHROSCOPY KNEE;  Surgeon: Javier Docker, MD;  Location: [facility];  Service: Orthopedics;  Laterality: Left;  debridement and removal of loose body   LEFT ANKLE ARTHROSCOPY W/ DEBRIDEMENT  05/12/2007   LEFT HYDROCELECTOMY  03/29/2005   AND REPAIR LEFT INGUINAL HERNIA W/ MESH   MOHS SURGERY  2002   RIGHT ORBITAL CANCER   NASAL ENDOSCOPY  08/07/2005   RIGHT EPISTAXIS  / POST SEPTOPLASTY  (HX RIGHT ORBITAL CA & S/P RADIATION/ NECROSIS ANTERIOR END OF BOTH INFERIOR TURBINATES)   occuloplastic surgery  2002   PARS PLANA VITRECTOMY  11/06/2004   RIGHT EYE RADIATION RETINOPATHY W/ HEMORRHAGE   PROSTATE ABLATION N/A 06/2022   RADIAL HEAD ARTHROPLASTY Right 06/15/2018   Procedure: RIGHT ELBOW PROXIMAL RADIOULNAR JOINT DEBRIDEMENT AND ARTHROPLASTY;  Surgeon: Bradly Bienenstock, MD;  Location: [facility];  Service: Orthopedics;  Laterality: Right;  BLOCK WITH SEDATION   REPAIR UNDESENDED RIGHT TESTICLE / RIGHT INGUINAL HERNIA  AGE 19   RIGHT ANKLE ARTHROSCOPY W/ EXTENSIVE DEBRIDEMENT  04/05/2008   x2   RIGHT SHOULDER SURGERY  2006   RIGHT SUPRAOMOHYOID NECK DISSECTION   03/08/2003   ZONES 1,2,3;   SUBMANDIBULAR MASS / METASTATIC SQUAMOUS CELL CARCINOMA RIGHT NECK   SAVORY DILATION N/A 02/24/2018   Procedure: SAVORY DILATION;  Surgeon: Carman Ching, MD;  Location: [facility];  Service: Endoscopy;  Laterality: N/A;   SEPTOPLASTY  06/2005   SHOULDER ARTHROSCOPY Left  SHOULDER ARTHROSCOPY W/ SUBACROMIAL DECOMPRESSION AND DISTAL CLAVICLE EXCISION  10/09/2008   AND DEBRIDEMENT OF RIGHT SHOULDER IMPINGEMENT & AC JOINT ARTHRITIS   SPINE SURGERY  2016   l 3 TO l 4 PLATE AND SCREWS   TOTAL THYROIDECTOMY  11/03/2001   PAPILLARY THYROID CARCINOMA   TRANSTHORACIC ECHOCARDIOGRAM  07/2011   grade I diastolic dysfunction/ ef 55-60%   ULNAR NERVE TRANSPOSITION Right 04/28/2014   Procedure: RIGHT ELBOW ULNA NERVE RELEASE TRANSPOSTION AND MEDIAL EPICONDYLAR DEBRIDEMENT AND REPAIR;  Surgeon: Sharma Covert, MD;  Location: [facility];  Service: Orthopedics;  Laterality: Right;   Patient Active Problem List   Diagnosis Date Noted   Primary osteoarthritis of both knees 09/22/2023   Boil 08/26/2023   Abscess 08/20/2023   Cellulitis 05/12/2023   Bruise of lower lip 04/17/2023   Wheezing 03/31/2023   Injury of medial collateral ligament (MCL) of knee 02/07/2023   Sprain of medial collateral ligament of right knee 02/07/2023   Osteoarthritis of subtalar joint 09/25/2022   Cystoid macular edema of right eye 07/18/2022   Hyperglycemia 07/18/2022   Nonexudative age-related macular degeneration, left eye, early dry stage 07/18/2022   Retinal edema 07/18/2022   Superficial punctate keratitis of right eye 07/18/2022   Grief at loss of child 05/22/2022   Actinic keratoses 05/22/2022    Pain in joint of left elbow 04/04/2022   Abnormal defecation 02/26/2022   Atrophic gastritis 02/26/2022   Bipolar 1 disorder (HCC) 02/26/2022   BPH (benign prostatic hyperplasia) 02/26/2022   Bronchiolectasis (HCC) 02/26/2022   Disease of thyroid gland 02/26/2022   Diverticular disease of colon 02/26/2022   Family history of malignant neoplasm of digestive organs 02/26/2022   Hyperlipidemia 02/26/2022   IgG deficiency (HCC) 02/26/2022   Immune deficiency disorder (HCC) 02/26/2022   Irritable bowel syndrome 02/26/2022   Pancreatic cyst 02/26/2022   History of colonic polyps 02/26/2022   Portosystemic shunt, 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/26/2022   Pyloric ulcer 02/26/2022   Scoliosis 02/26/2022   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   Spinal stenosis of lumbar region 02/26/2022   Prostate cancer (HCC) 02/26/2022   Dysuria 10/03/2021   Earache 06/14/2021   Anemia 06/14/2021   History of head and neck cancer 03/28/2021   Swelling of right parotid gland 03/28/2021   Epistaxis, recurrent 06/26/2020   PMR (polymyalgia rheumatica) (HCC) 02/22/2020   Diverticulitis 01/25/2020   Abdominal pain 01/25/2020   Iliac vessel injury 11/30/2019   Stress at home 09/14/2019   LLQ abdominal pain 09/06/2019   IPMN (intraductal papillary mucinous neoplasm) 07/19/2019   History of thyroid cancer 06/03/2019   Abnormal findings on diagnostic imaging of liver 06/03/2019   Epididymitis 03/16/2019   Difficulty urinating 03/16/2019   Pain in right foot 03/12/2019   Hypothyroidism (acquired) 09/29/2018   Moderate persistent asthma with acute exacerbation 09/29/2018   Prediabetes 09/29/2018   RTI (respiratory tract infection) 09/29/2018   ETD (Eustachian tube dysfunction), bilateral 09/02/2018   Sensorineural hearing loss (SNHL) of both ears 07/31/2018   Bruising 11/17/2017   Encounter for other orthopedic aftercare 11/14/2017   Cicatricial lagophthalmos of left lower eyelid  10/31/2017   Closed fracture of head of right radius 10/24/2017   Palatal mass 09/16/2017   Sore throat 09/16/2017   Testicular pain, right 08/01/2017   Combined form of age-related cataract, left eye 05/19/2017   Dry eye syndrome of both eyes 05/19/2017   Primary open angle glaucoma (POAG) of left eye, mild stage 05/19/2017  Radiation retinopathy, 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   Secondary glaucoma due to combination mechanisms, right, severe stage 05/19/2017   Fatigue 01/21/2017   Ankle pain, left 01/21/2017   Fall (on) (from) other stairs and steps, initial encounter 11/11/2016   Abrasion of head 11/11/2016   Ascending aortic aneurysm (HCC) 07/23/2016   Erectile dysfunction 07/19/2016   Bronchiectasis (HCC) 04/23/2016   Chronic bronchitis (HCC) 03/11/2016   Cerumen impaction 08/04/2015   Bilateral impacted cerumen 08/04/2015   Cervical disc disorder with radiculopathy of cervical region 06/06/2014   Elevated WBC count 06/06/2014   Iron deficiency anemia 06/06/2014   Edema 02/21/2014   Tinnitus 02/21/2014   Well adult exam 05/31/2013   Glaucoma 05/31/2013   RML pneumonia 03/31/2013   Hypertension, uncontrolled 02/25/2013   Anxiety disorder 08/08/2010   Chest pain, atypical 02/08/2010   ESOPHAGEAL STRICTURE 11/30/2009   Osteoarthritis 08/07/2009   Malaise and fatigue 05/14/2008   Coronary atherosclerosis 11/04/2007   Lumbago 11/04/2007   THYROIDECTOMY, HX OF 11/04/2007   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    PCP: Tresa Garter, MD  REFERRING PROVIDER: Guillermina City, PA-C  REFERRING DIAG: [phone] (ICD-10-CM) - Right knee pain M25.562 (ICD-10-CM) - Left knee pain R60.0 Bilateral edema of lower extremity  THERAPY DIAG:  Chronic pain of both knees  Other abnormalities of gait and mobility  Muscle weakness (generalized)  Rationale for Evaluation and Treatment: Rehabilitation  ONSET DATE: over a year ago  SUBJECTIVE:   SUBJECTIVE  STATEMENT: 11/14/2023 pt states knee is a bit sore but feels more stable. No issues after last session. States his ankle is bothering him a bit, turned it. Back is doing well after the injection. States he is motivated to try and use gym equipment    From initial evaluation:  Pt states he has BIL knee pain (L>R) for over a year at this point, first noticed difficulty with floor transfers and then worsened to the point where the majority of transfers bothered knees to some extent. Particularly bothersome with stairs. He denies any issues with swelling or N/T. He does endorse some chronic ankle swelling bilaterally that he is in communication w/ providers about. We also discuss his complex medical history in depth today, particularly in regard to his back for which he states he will likely be receiving an epidural at some point for. He does mention that he received BIL knee injections ~3 days ago with about 50% relief.   PERTINENT HISTORY: anemia, anxiety, bipolar 1, BPH, basal cell carcinoma, other hx of cancer, CVID, depression, GERD, HTN, PVD, vertigo, thoracic aortic aneurysm, hx thyroidectomy, hx lumbar fusion  PAIN:  Are you having pain: yes Location/description: BIL knees (R>L), anterior knee  NPRS 3/10  - aggravating factors: stair navigation, kneeling, floor transfers, transfers from lower surfaces  - Easing factors: injections, positional changes    PRECAUTIONS: cancer history, aortic aneurysm (no isometrics)  WEIGHT BEARING RESTRICTIONS: No  FALLS:  Has patient fallen in last 6 months? 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 Does have some limitations due to back issues  PATIENT GOALS: would like to be more active  NEXT MD  VISIT: TBD, plans to follow with Dr. Allena Katz beginning of April   OBJECTIVE:  Note:  Objective measures were completed at Evaluation unless otherwise noted.  DIAGNOSTIC FINDINGS:  Recent MRI (09/29/23) for lumbar spine, no recent LE imaging in chart - pt states he had XR done through [facility], showed arthritis and edema in both knees, states they didn't feel he needed an MRI   PATIENT SURVEYS:  LEFS 38/80  COGNITION: Overall cognitive status: Within functional limits for tasks assessed     SENSATION: Does not endorse any sensory complaints today  EDEMA:  Denies issues with knee edema     POSTURE: fwd head, rounded shoulders  PALPATION: Concordant tenderness anterior knees BIL, along joint line and patellar tendons. R is more tender to the touch than the L although the L is reportedly more symptomatic   LOWER EXTREMITY ROM:      Right eval Left eval  Hip flexion    Hip extension    Hip internal rotation    Hip external rotation    Knee extension full Full (passively, does have mild limitation actively against gravity)   Knee flexion 130 deg * 110 deg *  (Blank rows = not tested) (Key: WFL = within functional limits not formally assessed, * = concordant pain, s = stiffness/stretching sensation, NT = not tested)  Comments:    LOWER EXTREMITY MMT:    MMT Right eval Left eval  Hip flexion 4 4-  Hip abduction (modified sitting) 4 4  Hip internal rotation    Hip external rotation    Knee flexion 4+ 4  Knee extension 4+ 4  Ankle dorsiflexion 4 4   (Blank rows = not tested) (Key: WFL = within functional limits not formally assessed, * = concordant pain, s = stiffness/stretching sensation, NT = not tested)  Comments:     FUNCTIONAL TESTS:  5xSTS: 12.7sec no UE support, crepitus BIL, L knee pain  GAIT: Distance walked: within clinic Assistive device utilized: None Level of assistance: Complete Independence Comments: widened BOS, reduced truncal rotation, reduced knee  ROM BIL throughout all phases of gait but more prominent on L                                                                                                                                 TREATMENT DATE:  [facility] Adult PT Treatment:                                                DATE: 11/14/23 Therapeutic Exercise: Nu step L5 LE only during subjective  Seated leg curl machine 10# BIL 2x12 Seated leg extension machine 10# 2x8 BIL  Seated calf ext machine 10# 10 BIL Increased time spent w/ education/discussion re: safety w/ setting up gym equipment, principles of basic progression  Therapeutic Activity: STS 3x5 from standard chair cues for pacing and mechanics  6 inch step weight  shift x10 BIL cues for mechanics    [facility] Adult PT Treatment:                                                DATE: 11/11/23 Therapeutic Exercise: Standing green band TKE 3x10BIL Green band hamstring curl 2x8 Seated hamstring stretch 3x30 sec BIL w/ stool prop  HEP discussion/education  Neuromuscular re-ed: Unresisted LAQ x12 BIL emphasis on quad contraction 4 inch fwd step up 2x8, UE support and cues for quad control    [facility] Adult PT Treatment:                                                DATE: 11/06/23  Therapeutic Exercise: Upright bike, L7-10 x 6 min for warm up SLR each LE 2 x 10 Bridge with core engaged 2 x 10 Sidelying hip abdct x 10 R/L, repeated on LLE  Seated LAQ 2# x 10 (audible crepitus on RLE) each LE;  x 5 Standing TKE with green band x 5 sec hold x 10 each LE  Seated quad stretch with single foot under chair, sliding hips forward x 15s x 2 each LE Seated hamstring stretch x 20s x 1 each LE, cues for posture  PATIENT EDUCATION:  Education details: rationale for interventions, HEP  Person educated: Patient Education method: Explanation, Demonstration, Tactile cues, Verbal cues Education comprehension: verbalized understanding, returned demonstration, verbal cues required, tactile cues  required, and needs further education     HOME EXERCISE PROGRAM: Access Code: [email or web address] URL: [email or web address]  ASSESSMENT:  CLINICAL IMPRESSION: 11/14/2023 Pt arrives w/ report of mild soreness but overall doing well, no issues after last session. Today we begin to introduce light resistance w/ gym machines to promote increased long term self efficacy with exercise program. He tolerates this well with education on safe setup and basic principles of gradual progression. Increased time is spent with this but he tolerates this well, reports some muscle fatigue as expected but no increase in pain. Also able to progress functional mobility training with emphasis on mechanics. No adverse events, denies any increase in pain on departure. Recommend continuing along current POC in order to address relevant deficits and improve functional tolerance.Pt departs today's session in no acute distress, all voiced questions/concerns addressed appropriately from PT perspective.     From Initial evaluation: Patient is a pleasant 68 y.o. gentleman who was seen today for physical therapy evaluation and treatment for bilateral knee pain ongoing over past year, much improved with recent injections. Primarily limited in closed chain activities involving knee flexion such as transfers and stair navigation. On exam he demonstrates concordant limitations in knee mobility and strength, L more affected than R. His 5xSTS time is right at cutoff score for fall risk in majority of populations (12-14sec, varying) and elicits mild knee pain. No adverse events, tolerates exam/HEP well without increase in resting pain. Recommend trial of skilled PT to address aforementioned deficits with aim of improving functional tolerance and reducing pain with typical activities. Pt departs today's session in no acute distress, all voiced concerns/questions addressed appropriately from PT perspective.       OBJECTIVE IMPAIRMENTS:  Abnormal gait, decreased activity tolerance, decreased endurance, decreased mobility, difficulty walking, decreased ROM, decreased  strength, postural dysfunction, and pain.   ACTIVITY LIMITATIONS: standing, squatting, stairs, transfers, and locomotion level  PARTICIPATION LIMITATIONS: meal prep, cleaning, laundry, and community activity  PERSONAL FACTORS: Age, Time since onset of injury/illness/exacerbation, and 3+ comorbidities: anemia, anxiety, bipolar 1, BPH, basal cell carcinoma, other hx of cancer, CVID, depression, GERD, HTN, PVD, vertigo, thoracic aortic aneurysm, hx thyroidectomy, hx lumbar fusion  are also affecting patient's functional outcome.   REHAB POTENTIAL: Fair given chronicity and comorbidities  CLINICAL DECISION MAKING: Evolving/moderate complexity  EVALUATION COMPLEXITY: Moderate   GOALS:   SHORT TERM GOALS: Target date: 11/14/2023  Pt will demonstrate appropriate understanding and performance of initially prescribed HEP in order to facilitate improved independence with management of symptoms.  Baseline: HEP established  11/14/23: reports good HEP adherence  Goal status: MET  2. Pt will report at least 25% improvement in overall pain levels over past week in order to facilitate improved tolerance to typical daily activities.   Baseline: 0-8/10 Goal status: ONGOING  LONG TERM GOALS: Target date: 12/12/2023  Pt will score 50/80 or greater on LEFS in order to demonstrate improved perception of function due to symptoms (MCID 9 pts) Baseline: 38/80 Goal status: INITIAL  2.  Pt will demonstrate at least 120 degrees of knee flexion AROM bilaterally in order to facilitate improved tolerance to gait/transfers.  Baseline: see ROM chart above Goal status: INITIAL  3.  Pt will report at least 50% decrease in overall pain levels in past week in order to facilitate improved tolerance to basic ADLs/mobility.   Baseline: 0-8/10  Goal status: INITIAL    4.  Pt will be able to  perform 5xSTS in less than or equal to 10 in order to demonstrate reduced fall risk and improved functional independence (MCID 5xSTS = 2.3 sec). Baseline: 12sec no UE support Goal status: INITIAL   5. Pt will report/demonstrate ability to navigate at least 13 steps w/o UE support and less than 3 pt increase in pain in order to facilitate improved tolerance to home navigation  Baseline: pain with stair navigation  Goal status: INITIAL   PLAN:  PT FREQUENCY: 1-2x/week  PT DURATION: 8 weeks  PLANNED INTERVENTIONS: 97164- PT Re-evaluation, 97110-Therapeutic exercises, 97530- Therapeutic activity, 97112- Neuromuscular re-education, 97535- Self Care, 16109- Manual therapy, [id number]- Gait training, [phone]- Aquatic Therapy, Patient/Family education, Balance training, Stair training, Taping, Dry Needling, Joint mobilization, Cryotherapy, and Moist heat  PLAN FOR NEXT SESSION: Review/update HEP PRN. Work on [employer] exercises as appropriate with emphasis on quad activation, knee flexion mobility (especially on L), closed chain stability and functional mechanics. Symptom modification strategies as indicated/appropriate. Mindful of complex medical history including aortic aneurysm, cancer. Interested in using gym equipment  Ashley Murrain PT, DPT 11/14/2023 2:31 PM    [facility] [address] [place], [name], [address] Phone: [phone]   Fax:  [phone]

## 2023-11-14 ENCOUNTER — Ambulatory Visit (HOSPITAL_BASED_OUTPATIENT_CLINIC_OR_DEPARTMENT_OTHER): Admitting: Physical Therapy

## 2023-11-14 ENCOUNTER — Encounter (HOSPITAL_BASED_OUTPATIENT_CLINIC_OR_DEPARTMENT_OTHER): Payer: Self-pay | Admitting: Physical Therapy

## 2023-11-14 DIAGNOSIS — G8929 Other chronic pain: Secondary | ICD-10-CM | POA: Diagnosis not present

## 2023-11-14 DIAGNOSIS — R2689 Other abnormalities of gait and mobility: Secondary | ICD-10-CM

## 2023-11-14 DIAGNOSIS — M6281 Muscle weakness (generalized): Secondary | ICD-10-CM

## 2023-11-14 DIAGNOSIS — M25562 Pain in left knee: Secondary | ICD-10-CM | POA: Diagnosis not present

## 2023-11-14 DIAGNOSIS — M25561 Pain in right knee: Secondary | ICD-10-CM | POA: Diagnosis not present

## 2023-11-17 DIAGNOSIS — T66XXXS Radiation sickness, unspecified, sequela: Secondary | ICD-10-CM | POA: Diagnosis not present

## 2023-11-17 DIAGNOSIS — H3589 Other specified retinal disorders: Secondary | ICD-10-CM | POA: Diagnosis not present

## 2023-11-17 DIAGNOSIS — H353121 Nonexudative age-related macular degeneration, left eye, early dry stage: Secondary | ICD-10-CM | POA: Diagnosis not present

## 2023-11-18 ENCOUNTER — Ambulatory Visit (HOSPITAL_BASED_OUTPATIENT_CLINIC_OR_DEPARTMENT_OTHER): Admitting: Physical Therapy

## 2023-11-18 ENCOUNTER — Encounter: Admitting: Vascular Surgery

## 2023-11-18 ENCOUNTER — Ambulatory Visit: Admitting: Physician Assistant

## 2023-11-18 ENCOUNTER — Encounter: Payer: Self-pay | Admitting: Internal Medicine

## 2023-11-18 VITALS — BP 137/84 | HR 87 | Temp 98.2°F | Resp 18 | Ht 72.0 in | Wt 192.5 lb

## 2023-11-18 DIAGNOSIS — G8929 Other chronic pain: Secondary | ICD-10-CM

## 2023-11-18 DIAGNOSIS — I890 Lymphedema, not elsewhere classified: Secondary | ICD-10-CM

## 2023-11-18 DIAGNOSIS — M25561 Pain in right knee: Secondary | ICD-10-CM | POA: Diagnosis not present

## 2023-11-18 DIAGNOSIS — M25562 Pain in left knee: Secondary | ICD-10-CM | POA: Diagnosis not present

## 2023-11-18 DIAGNOSIS — R2689 Other abnormalities of gait and mobility: Secondary | ICD-10-CM

## 2023-11-18 DIAGNOSIS — M7989 Other specified soft tissue disorders: Secondary | ICD-10-CM

## 2023-11-18 DIAGNOSIS — M6281 Muscle weakness (generalized): Secondary | ICD-10-CM | POA: Diagnosis not present

## 2023-11-18 NOTE — Progress Notes (Signed)
 Requested by:  Tresa Garter, MD [address] [place],  [name] [address]  Reason for consultation: bilateral leg swelling    History of Present Illness   Alex [name] is a 68 y.o. (Jan 10, 1956) male who presents for evaluation of bilateral lower extremity edema.  The patient states he has dealt with left lower leg, ankle, and foot swelling for several years.  Over the past couple of months he has noticed that his swelling has gotten worse and is now also present in the right leg.  He says his swelling never completely goes away but it is significantly better in the mornings.  His swelling is worse later in the day after being on his feet throughout the day.  His swelling causes his legs to occasionally feel achy.  He occasionally has throbbing pain around his left ankle.  He does not wear any compression stockings or elevate his legs.  He has no prior history of previous vein procedures or history of DVT.  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 Burna Cash, oncology. Receives monthly IVIG 40 grams.   Depression    Epicondylitis    right elbow   GERD (gastroesophageal reflux disease)    Glaucoma BOTH EYES   RIGHT EYE RADIATION DAMAGE   Hearing loss    Bilateral   Hepatic cyst    Several, The lesion of concern in segment 6 of the liver has single large portal vein and hepatic vein branches extending to tt, in a pattern of enhancement which mirrors these vascular structures. 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 9 CM X 4. 3 CM PER NOV 06-14-17  CHEST CTFOLOWED BY DR Jens Som YEARLY FOR   Positional vertigo    HX OF WITH SINUS INFECTIONS   Prostate cancer (HCC)    Follows w/ Dr. Maisie Fus Polascik @ [facility].   Radial head fracture    Right   Squamous cell carcinoma of skin 06/04/2016   in situ-crown of scalp   Squamous cell carcinoma of skin 04/22/2017   in situ-crown scalp (txpbx)   Thoracic aortic aneurysm (HCC) 06/14/2017   last CT 4.1 CM Mild   Tinnitus    CONSTANT   Ulnar nerve compression    right  elbow   Unsteady gait    especially with stairs, depth perception off   Urinary hesitancy    Wears glasses     Past Surgical History:  Procedure Laterality Date   CARDIAC CATHETERIZATION  01-16-2006  DR Maisie Fus WALL   MILD CORONARY ATHEROSCLEROSIS/ MID TO DISTAL LAD 40% STENOSIS/ LVF 50-55%   CARPAL TUNNEL RELEASE Right 11/03/2017   Procedure: RIGHT HAND CARPAL TUNNEL RELEASE;  Surgeon: Bradly Bienenstock, MD;  Location: [facility];  Service: Orthopedics;  Laterality: Right;   CATARACT EXTRACTION Right    COLONSCOPY  2017 LAST DONE   MULTIPLE   CYST EXCISION Right 09/04/2023   Procedure: EXCISION CYST RIGHT SHOULDER;  Surgeon: Darnell Level, MD;  Location: [facility];  Service: General;  Laterality: Right;  LOCAL & MAC   ENDOSCOPY  LAST 2017   MULTIPLE DONE DILATION DONE ALSO   ESOPHAGOGASTRODUODENOSCOPY (EGD) WITH PROPOFOL N/A 02/24/2018   Procedure: ESOPHAGOGASTRODUODENOSCOPY (EGD) WITH PROPOFOL;  Surgeon: Carman Ching, MD;  Location: [facility];  Service: Endoscopy;  Laterality: N/A;   EXCISION RADIAL HEAD Right 11/03/2017   Procedure: RIGHT PROXIMAL RADIUS RADIAL HEAD RESECTION AND JOINT DEBRIDEMENT;  Surgeon: Bradly Bienenstock, MD;  Location: [facility];  Service: Orthopedics;  Laterality: Right;   EXTRACORPOREAL SHOCK WAVE LITHOTRIPSY Right 11/20/2020   Procedure: EXTRACORPOREAL SHOCK WAVE LITHOTRIPSY (ESWL);  Surgeon: Sebastian Ache, MD;  Location: [facility];  Service: Urology;  Laterality: Right;  75 MINS   KNEE ARTHROSCOPY  05/01/2012   Procedure: ARTHROSCOPY KNEE;  Surgeon: Javier Docker, MD;  Location: [facility];  Service: Orthopedics;  Laterality: Left;  debridement and removal of loose body   LEFT ANKLE ARTHROSCOPY W/ DEBRIDEMENT  05/12/2007   LEFT HYDROCELECTOMY  03/29/2005   AND REPAIR LEFT INGUINAL HERNIA W/ MESH   MOHS SURGERY  2002   RIGHT ORBITAL CANCER   NASAL ENDOSCOPY  08/07/2005    RIGHT EPISTAXIS  / POST SEPTOPLASTY  (HX RIGHT ORBITAL CA & S/P RADIATION/ NECROSIS ANTERIOR END OF BOTH INFERIOR TURBINATES)   occuloplastic surgery  2002   PARS PLANA VITRECTOMY  11/06/2004   RIGHT EYE RADIATION RETINOPATHY W/ HEMORRHAGE   PROSTATE ABLATION N/A 06/2022   RADIAL HEAD ARTHROPLASTY Right 06/15/2018   Procedure: RIGHT ELBOW PROXIMAL RADIOULNAR JOINT DEBRIDEMENT AND ARTHROPLASTY;  Surgeon: Bradly Bienenstock, MD;  Location: [facility];  Service: Orthopedics;  Laterality: Right;  BLOCK WITH SEDATION   REPAIR UNDESENDED RIGHT TESTICLE / RIGHT INGUINAL HERNIA  AGE 36   RIGHT ANKLE ARTHROSCOPY W/ EXTENSIVE DEBRIDEMENT  04/05/2008   x2   RIGHT SHOULDER  SURGERY  2006   RIGHT SUPRAOMOHYOID NECK DISSECTION   03/08/2003   ZONES 1,2,3;   SUBMANDIBULAR MASS / METASTATIC SQUAMOUS CELL CARCINOMA RIGHT NECK   SAVORY DILATION N/A 02/24/2018   Procedure: SAVORY DILATION;  Surgeon: Carman Ching, MD;  Location: [facility];  Service: Endoscopy;  Laterality: N/A;   SEPTOPLASTY  06/2005   SHOULDER ARTHROSCOPY Left    SHOULDER ARTHROSCOPY W/ SUBACROMIAL DECOMPRESSION AND DISTAL CLAVICLE EXCISION  10/09/2008   AND DEBRIDEMENT OF RIGHT SHOULDER IMPINGEMENT & AC JOINT ARTHRITIS   SPINE SURGERY  2016   l 3 TO l 4 PLATE AND SCREWS   TOTAL THYROIDECTOMY  11/03/2001   PAPILLARY THYROID CARCINOMA   TRANSTHORACIC ECHOCARDIOGRAM  07/2011   grade I diastolic dysfunction/ ef 55-60%   ULNAR NERVE TRANSPOSITION Right 04/28/2014   Procedure: RIGHT ELBOW ULNA NERVE RELEASE TRANSPOSTION AND MEDIAL EPICONDYLAR DEBRIDEMENT AND REPAIR;  Surgeon: Sharma Covert, MD;  Location: [facility];  Service: Orthopedics;  Laterality: Right;    Social History   Socioeconomic History   Marital status: Married    Spouse name: Not on file   Number of children: Not on file   Years of education: Not on file   Highest education level: Associate degree: occupational, Scientist, product/process development, or vocational  program  Occupational History   Not on file  Tobacco Use   Smoking status: Never   Smokeless tobacco: Never  Vaping Use   Vaping status: Never Used  Substance and Sexual Activity   Alcohol use: Yes    Alcohol/week: 0.0 standard drinks of alcohol    Comment: 1-2 glasses per month   Drug use: No   Sexual activity: Yes    Partners: Female    Birth control/protection: None  Other Topics Concern   Not on file  Social History Narrative   Originally from [place]. Moved to [place] in 1984. 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 No international travel other than [place]. Has a dog at home, poodle mix. His daughter who lives with him now has a dog. No mold exposure. No bird exposure. No hot tub exposure. Enjoys gardening.    Social Drivers of Corporate investment banker Strain: Low Risk  (05/09/2023)   Overall Financial Resource Strain (CARDIA)    Difficulty of Paying Living Expenses: Not hard at all  Food Insecurity: No Food Insecurity (05/09/2023)   Hunger Vital Sign    Worried About Running Out of Food in the Last Year: Never true    Ran Out of Food in the Last Year: Never true  Transportation Needs: No Transportation Needs (09/22/2023)   Received from [facility] - Transportation    In the past 12 months, has lack of transportation kept you from medical appointments or from getting medications?: No    Lack of Transportation (Non-Medical): No  Physical Activity: Insufficiently Active (05/09/2023)   Exercise Vital Sign    Days of Exercise per Week: 1 day    Minutes of Exercise per Session: 30 min  Stress: Stress Concern Present (05/09/2023)   [employer] of Occupational Health - Occupational Stress Questionnaire    Feeling of Stress : Rather much  Social Connections: Moderately Integrated (05/09/2023)   Social Connection  and Isolation Panel [NHANES]    Frequency of Communication with Friends and Family: More than three times a  week    Frequency of Social Gatherings with Friends and Family: Once a week    Attends Religious Services: More than 4 times per year    Active Member of [employer] or Organizations: No    Attends Engineer, structural: Not on file    Marital Status: Married  Intimate Partner Violence: Unknown (11/14/2021)   Received from [facility]   HITS    Physically Hurt: Not on file    Insult or Talk Down To: Not on file    Threaten Physical Harm: Not on file    Scream or Curse: Not on file    Family History  Problem Relation Age of Onset   Depression Sister    Rectal cancer Sister    Lung cancer Brother    Kidney disease Mother    Depression Mother    Stroke Father    COPD Father    Hypertension Father    Prostate cancer Father    Depression Daughter    Drug abuse Daughter    Psychiatric Illness Son     Current Outpatient Medications  Medication Sig Dispense Refill   acetaminophen (TYLENOL) 500 MG tablet Take by mouth.     albuterol (VENTOLIN HFA) 108 (90 Base) MCG/ACT inhaler TAKE 2 PUFFS BY MOUTH EVERY 6 HOURS AS NEEDED FOR WHEEZE OR SHORTNESS OF BREATH (Patient not taking: Reported on 10/23/2023) 6.7 each 5   amLODipine (NORVASC) 5 MG tablet Take 1 tablet (5 mg total) by mouth daily. 90 tablet 3   aspirin EC 81 MG tablet Take 1 tablet by mouth daily.     atorvastatin (LIPITOR) 40 MG tablet TAKE 1 TABLET BY MOUTH EVERY DAY 90 tablet 1   AXIRON 30 MG/ACT SOLN Place 2 Act onto the skin daily.     brimonidine (ALPHAGAN) 0.2 % ophthalmic solution SMARTSIG:In Eye(s)     Coenzyme Q10 (CO Q 10 PO) Take 1 capsule by mouth daily.      EPINEPHrine 0.3 mg/0.3 mL IJ SOAJ injection Inject 0.3 mg into the muscle as needed. (Patient not taking: Reported on 10/23/2023)     erythromycin ophthalmic ointment Place into the right eye at bedtime.     furosemide (LASIX) 20 MG  tablet TAKE 1 TO 2 TABLETS BY MOUTH DAILY AS NEEDED (Patient not taking: Reported on 10/23/2023) 180 tablet 1   gabapentin (NEURONTIN) 100 MG capsule Take 1 capsule (100 mg total) by mouth 2 (two) times daily. 60 capsule 1   Immune Globulin, Human, 4 GM/20ML SOLN Inject into the skin once a week. wednesday     LamoTRIgine 300 MG TB24 24 hour tablet Take 1 tablet (300 mg total) by mouth every morning. 90 tablet 0   latanoprost (XALATAN) 0.005 % ophthalmic solution 1 DROP EACH EYE AT NIGHT     levothyroxine (SYNTHROID) 150 MCG tablet TAKE 1 TABLET BY MOUTH DAILY BEFORE BREAKFAST. 90 tablet 2   meloxicam (MOBIC) 15 MG tablet TAKE 1 TABLET BY MOUTH EVERY DAY AS NEEDED 90 tablet 0   Multiple Vitamin (MULTI-VITAMINS) TABS Take by mouth.     OMEGA-3 KRILL OIL PO Take 1 capsule by mouth daily.      omeprazole (PRILOSEC) 20 MG capsule Take 20 mg by mouth every evening.     sildenafil (REVATIO) 20 MG tablet Take 1 tablet (20 mg total) by mouth as needed. 30 tablet 1   sulfamethoxazole-trimethoprim (BACTRIM DS) 800-160 MG tablet Take 1 tablet by mouth 2 (two) times daily. 20 tablet  0   tadalafil (CIALIS) 5 MG tablet Take 5 mg by mouth.     tamsulosin (FLOMAX) 0.4 MG CAPS capsule TAKE 1 CAPSULE BY MOUTH EVERY DAY (Patient taking differently: as needed.) 90 capsule 1   telmisartan (MICARDIS) 80 MG tablet Take 1 tablet (80 mg total) by mouth daily. 90 tablet 2   timolol (TIMOPTIC) 0.5 % ophthalmic solution timolol maleate 0.5 % eye drops  INSTILL 1 DROP INTO RIGHT EYE TWICE A DAY     traMADol (ULTRAM) 50 MG tablet Take 1 tablet (50 mg total) by mouth every 6 (six) hours as needed for moderate pain (pain score 4-6). 12 tablet 0   triamcinolone ointment (KENALOG) 0.5 % Apply 1 Application topically 4 (four) times daily. On the lip 30 g 0   zolpidem (AMBIEN) 10 MG tablet TAKE 1/2 (ONE-HALF) TABLET BY MOUTH AT BEDTIME AND  TAKE  THE  OTHER  HALF  IF  NEEDED 30 tablet 0   No current facility-administered  medications for this visit.    No Known Allergies  REVIEW OF SYSTEMS (negative unless checked):   Cardiac:  []  Chest pain or chest pressure? []  Shortness of breath upon activity? []  Shortness of breath when lying flat? []  Irregular heart rhythm?  Vascular:  []  Pain in calf, thigh, or hip brought on by walking? []  Pain in feet at night that wakes you up from your sleep? []  Blood clot in your veins? [x]  Leg swelling?  Pulmonary:  []  Oxygen at home? []  Productive cough? []  Wheezing?  Neurologic:  []  Sudden weakness in arms or legs? []  Sudden numbness in arms or legs? []  Sudden onset of difficult speaking or slurred speech? []  Temporary loss of vision in one eye? []  Problems with dizziness?  Gastrointestinal:  []  Blood in stool? []  Vomited blood?  Genitourinary:  []  Burning when urinating? []  Blood in urine?  Psychiatric:  []  Major depression  Hematologic:  []  Bleeding problems? []  Problems with blood clotting?  Dermatologic:  []  Rashes or ulcers?  Constitutional:  []  Fever or chills?  Ear/Nose/Throat:  []  Change in hearing? []  Nose bleeds? []  Sore throat?  Musculoskeletal:  []  Back pain? [x]  Joint pain? []  Muscle pain?   Physical Examination     Vitals:   11/18/23 1109  BP: 137/84  Pulse: 87  Resp: 18  Temp: 98.2 F (36.8 C)  TempSrc: Temporal  SpO2: 96%  Weight: 192 lb 8 oz (87.3 kg)  Height: 6' (1.829 m)   Body mass index is 26.11 kg/m.  General:  WDWN in NAD; vital signs documented above Gait: Not observed HENT: WNL, normocephalic Pulmonary: normal non-labored breathing  Abdomen: soft, NT, no masses Skin: without rashes Vascular Exam/Pulses: BLE warm and well perfused Extremities: BLE without varicose veins, without reticular veins, with edema L>R , without stasis pigmentation, without lipodermatosclerosis, without ulcers. Bilateral foot edema Musculoskeletal: no muscle wasting or atrophy  Neurologic: A&O X 3;  No focal  weakness or paresthesias are detected Psychiatric:  The pt has Normal affect.  Non-invasive Vascular Imaging   LLE Venous Insufficiency Duplex (11/07/2023):  No evidence of superficial or deep vein reflux.  No evidence of DVT or SVT.  Interstitial fluid noted in the calf.  Medical Decision Making   TAJAE RYBICKI is a 68 y.o. male who presents for evaluation of   Based on the patient's duplex, there is no evidence of superficial or deep vein reflux in the left lower extremity.  There is also no evidence of  DVT or SVT.  He would not be a candidate for vein ablation, given that his venous system is competent The patient has a several year history of left lower extremity swelling, that has worsened with time.  He also notes new onset right lower extremity swelling.  His swelling is in the lower legs, ankles, and feet.  He states that his swelling never completely goes away but does worsen throughout the day after spending a prolonged period of time on his feet On exam he has nonpitting edema of the lower legs, ankles, and feet.  He is swelling is worse on the left.  He appears to have some dependent rubor of his feet I have explained to the patient that he has no evidence of venous insufficiency on exam.  I have also explained to him that his swelling is likely due to lymphedema.  His symptoms should be well-controlled with conservative therapy, including wearing compression stockings daily, elevating his legs above his heart, avoiding prolonged sitting and standing, and exercising regularly.  He currently does not wear any compression stockings or elevate his legs He was measured for and given a pair of 15 to 20 mmHg knee-high compression stockings.  He was also provided measurements of his legs if he wants to buy more stockings online He is aware that he is not a candidate for any procedures and that his lymphedema can only be treated with conservative measures.  He can follow-up with our office as  needed   Ernestene Mention, PA-C [facility] Office: [phone]  11/18/2023, 11:13 AM  Clinic MD: Steve Rattler

## 2023-11-18 NOTE — Therapy (Signed)
 OUTPATIENT PHYSICAL THERAPY LOWER EXTREMITY TREATMENT   Patient Name: Alex [name] MRN: [id number] DOB:[birth year], 68 y.o., male Today's Date: 11/18/2023  END OF SESSION:  PT End of Session - 11/18/23 1332     Visit Number 6    Number of Visits 17    Date for PT Re-Evaluation 12/12/23    Authorization Type BCBS    PT Start Time 1335    PT Stop Time 1415    PT Time Calculation (min) 40 min    Activity Tolerance Patient tolerated treatment well             Past Medical History:  Diagnosis Date   Anemia    Anxiety    Atypical nevus 04/12/1997   dyplastic-left chest below nipple   Atypical nevus 01/18/2005   slight-mod-mid upper abd, slight-mod-right lateral chest-(WS), slight-mod-mid lower back (punch)   Atypical nevus 05/31/2005   dysplastic-central lowerback (exc), dysplastic- right abdomen (Exc)   Basal cell carcinoma 06/04/2016   back of neck   Bipolar I disorder (HCC)    Bleeding ulcer 2016   BPH (benign prostatic hypertrophy) with urinary obstruction    Cancer (HCC)    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 Burna Cash, oncology. Receives monthly IVIG 40 grams.   Depression    Epicondylitis    right elbow   GERD (gastroesophageal reflux disease)    Glaucoma BOTH EYES   RIGHT EYE RADIATION DAMAGE   Hearing loss    Bilateral   Hepatic cyst    Several, The lesion of concern in segment 6 of the liver has single large portal vein and hepatic vein branches  extending to tt, in a pattern of enhancement which mirrors these vascular structures. 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 9 CM X 4. 3 CM PER NOV 06-14-17  CHEST CTFOLOWED BY DR CRENSHAW YEARLY FOR   Positional vertigo    HX OF WITH SINUS INFECTIONS  Prostate cancer [facility])    Follows w/ Dr. Maisie Fus Polascik @ [facility].   Radial head fracture    Right   Squamous cell carcinoma of skin 06/04/2016   in situ-crown of scalp   Squamous cell carcinoma of skin 04/22/2017   in situ-crown scalp (txpbx)   Thoracic aortic aneurysm (HCC) 06/14/2017   last CT 4.1 CM Mild   Tinnitus    CONSTANT   Ulnar nerve compression    right elbow   Unsteady gait    especially with stairs, depth perception off   Urinary hesitancy    Wears glasses    Past Surgical History:  Procedure Laterality Date   CARDIAC CATHETERIZATION  01-16-2006  DR Maisie Fus WALL   MILD CORONARY ATHEROSCLEROSIS/ MID TO DISTAL LAD 40% STENOSIS/ LVF 50-55%   CARPAL TUNNEL RELEASE Right 11/03/2017   Procedure: RIGHT HAND CARPAL TUNNEL RELEASE;  Surgeon: Bradly Bienenstock, MD;  Location: [facility];   Service: Orthopedics;  Laterality: Right;   CATARACT EXTRACTION Right    COLONSCOPY  2017 LAST DONE   MULTIPLE   CYST EXCISION Right 09/04/2023   Procedure: EXCISION CYST RIGHT SHOULDER;  Surgeon: Darnell Level, MD;  Location: [facility];  Service: General;  Laterality: Right;  LOCAL & MAC   ENDOSCOPY  LAST 2017   MULTIPLE DONE DILATION DONE ALSO   ESOPHAGOGASTRODUODENOSCOPY (EGD) WITH PROPOFOL N/A 02/24/2018   Procedure: ESOPHAGOGASTRODUODENOSCOPY (EGD) WITH PROPOFOL;  Surgeon: Carman Ching, MD;  Location: [facility];  Service: Endoscopy;  Laterality: N/A;   EXCISION RADIAL HEAD Right 11/03/2017   Procedure: RIGHT PROXIMAL RADIUS RADIAL HEAD RESECTION AND JOINT DEBRIDEMENT;  Surgeon: Bradly Bienenstock, MD;  Location: [facility];  Service: Orthopedics;  Laterality: Right;   EXTRACORPOREAL SHOCK WAVE LITHOTRIPSY Right 11/20/2020   Procedure: EXTRACORPOREAL SHOCK WAVE LITHOTRIPSY (ESWL);  Surgeon: Sebastian Ache, MD;  Location: [facility];  Service: Urology;  Laterality: Right;  75 MINS   KNEE ARTHROSCOPY  05/01/2012   Procedure: ARTHROSCOPY KNEE;  Surgeon: Javier Docker, MD;  Location: [facility];  Service: Orthopedics;  Laterality: Left;  debridement and removal of loose body   LEFT ANKLE ARTHROSCOPY W/ DEBRIDEMENT  05/12/2007   LEFT HYDROCELECTOMY  03/29/2005   AND REPAIR LEFT INGUINAL HERNIA W/ MESH   MOHS SURGERY  2002   RIGHT ORBITAL CANCER   NASAL ENDOSCOPY  08/07/2005   RIGHT EPISTAXIS  / POST SEPTOPLASTY  (HX RIGHT ORBITAL CA & S/P RADIATION/ NECROSIS ANTERIOR END OF BOTH INFERIOR TURBINATES)   occuloplastic surgery  2002   PARS PLANA VITRECTOMY  11/06/2004   RIGHT EYE RADIATION RETINOPATHY W/ HEMORRHAGE   PROSTATE ABLATION N/A 06/2022   RADIAL HEAD ARTHROPLASTY Right 06/15/2018   Procedure: RIGHT ELBOW PROXIMAL RADIOULNAR JOINT DEBRIDEMENT AND ARTHROPLASTY;  Surgeon: Bradly Bienenstock, MD;  Location: [facility];  Service: Orthopedics;  Laterality: Right;  BLOCK WITH SEDATION   REPAIR UNDESENDED RIGHT TESTICLE / RIGHT INGUINAL HERNIA  AGE 1   RIGHT ANKLE ARTHROSCOPY W/ EXTENSIVE DEBRIDEMENT  04/05/2008   x2   RIGHT SHOULDER SURGERY  2006   RIGHT SUPRAOMOHYOID NECK DISSECTION   03/08/2003   ZONES 1,2,3;   SUBMANDIBULAR MASS / METASTATIC SQUAMOUS CELL CARCINOMA RIGHT NECK   SAVORY DILATION N/A 02/24/2018   Procedure: SAVORY DILATION;  Surgeon: Carman Ching, MD;  Location: [facility];  Service: Endoscopy;  Laterality: N/A;   SEPTOPLASTY  06/2005   SHOULDER ARTHROSCOPY Left    SHOULDER ARTHROSCOPY  W/ SUBACROMIAL DECOMPRESSION AND DISTAL CLAVICLE EXCISION  10/09/2008   AND DEBRIDEMENT OF RIGHT SHOULDER IMPINGEMENT & AC JOINT ARTHRITIS   SPINE SURGERY  2016   l 3 TO l 4 PLATE AND SCREWS   TOTAL THYROIDECTOMY  11/03/2001   PAPILLARY THYROID CARCINOMA   TRANSTHORACIC ECHOCARDIOGRAM  07/2011   grade I diastolic dysfunction/ ef 55-60%   ULNAR NERVE TRANSPOSITION Right 04/28/2014   Procedure: RIGHT ELBOW ULNA NERVE RELEASE TRANSPOSTION AND MEDIAL EPICONDYLAR DEBRIDEMENT AND REPAIR;  Surgeon: Sharma Covert, MD;  Location: [facility];  Service: Orthopedics;  Laterality: Right;   Patient Active Problem List   Diagnosis Date Noted   Primary osteoarthritis of both knees 09/22/2023   Boil 08/26/2023   Abscess 08/20/2023   Cellulitis 05/12/2023   Bruise of lower lip 04/17/2023   Wheezing 03/31/2023   Injury of medial collateral ligament (MCL) of knee 02/07/2023   Sprain of medial collateral ligament of right knee 02/07/2023   Osteoarthritis of subtalar joint 09/25/2022   Cystoid macular edema of right eye 07/18/2022   Hyperglycemia 07/18/2022   Nonexudative age-related macular degeneration, left eye, early dry stage 07/18/2022   Retinal edema 07/18/2022   Superficial punctate keratitis of right eye 07/18/2022   Grief at loss of child 05/22/2022   Actinic keratoses 05/22/2022    Pain in joint of left elbow 04/04/2022   Abnormal defecation 02/26/2022   Atrophic gastritis 02/26/2022   Bipolar 1 disorder (HCC) 02/26/2022   BPH (benign prostatic hyperplasia) 02/26/2022   Bronchiolectasis (HCC) 02/26/2022   Disease of thyroid gland 02/26/2022   Diverticular disease of colon 02/26/2022   Family history of malignant neoplasm of digestive organs 02/26/2022   Hyperlipidemia 02/26/2022   IgG deficiency (HCC) 02/26/2022   Immune deficiency disorder (HCC) 02/26/2022   Irritable bowel syndrome 02/26/2022   Pancreatic cyst 02/26/2022   History of colonic polyps 02/26/2022   Portosystemic shunt, 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/26/2022   Pyloric ulcer 02/26/2022   Scoliosis 02/26/2022   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   Spinal stenosis of lumbar region 02/26/2022   Prostate cancer (HCC) 02/26/2022   Dysuria 10/03/2021   Earache 06/14/2021   Anemia 06/14/2021   History of head and neck cancer 03/28/2021   Swelling of right parotid gland 03/28/2021   Epistaxis, recurrent 06/26/2020   PMR (polymyalgia rheumatica) (HCC) 02/22/2020   Diverticulitis 01/25/2020   Abdominal pain 01/25/2020   Iliac vessel injury 11/30/2019   Stress at home 09/14/2019   LLQ abdominal pain 09/06/2019   IPMN (intraductal papillary mucinous neoplasm) 07/19/2019   History of thyroid cancer 06/03/2019   Abnormal findings on diagnostic imaging of liver 06/03/2019   Epididymitis 03/16/2019   Difficulty urinating 03/16/2019   Pain in right foot 03/12/2019   Hypothyroidism (acquired) 09/29/2018   Moderate persistent asthma with acute exacerbation 09/29/2018   Prediabetes 09/29/2018   RTI (respiratory tract infection) 09/29/2018   ETD (Eustachian tube dysfunction), bilateral 09/02/2018   Sensorineural hearing loss (SNHL) of both ears 07/31/2018   Bruising 11/17/2017   Encounter for other orthopedic aftercare 11/14/2017   Cicatricial lagophthalmos of left lower eyelid  10/31/2017   Closed fracture of head of right radius 10/24/2017   Palatal mass 09/16/2017   Sore throat 09/16/2017   Testicular pain, right 08/01/2017   Combined form of age-related cataract, left eye 05/19/2017   Dry eye syndrome of both eyes 05/19/2017   Primary open angle glaucoma (POAG) of left eye, mild stage 05/19/2017   Radiation retinopathy,  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   Secondary glaucoma due to combination mechanisms, right, severe stage 05/19/2017   Fatigue 01/21/2017   Ankle pain, left 01/21/2017   Fall (on) (from) other stairs and steps, initial encounter 11/11/2016   Abrasion of head 11/11/2016   Ascending aortic aneurysm (HCC) 07/23/2016   Erectile dysfunction 07/19/2016   Bronchiectasis (HCC) 04/23/2016   Chronic bronchitis (HCC) 03/11/2016   Cerumen impaction 08/04/2015   Bilateral impacted cerumen 08/04/2015   Cervical disc disorder with radiculopathy of cervical region 06/06/2014   Elevated WBC count 06/06/2014   Iron deficiency anemia 06/06/2014   Edema 02/21/2014   Tinnitus 02/21/2014   Well adult exam 05/31/2013   Glaucoma 05/31/2013   RML pneumonia 03/31/2013   Hypertension, uncontrolled 02/25/2013   Anxiety disorder 08/08/2010   Chest pain, atypical 02/08/2010   ESOPHAGEAL STRICTURE 11/30/2009   Osteoarthritis 08/07/2009   Malaise and fatigue 05/14/2008   Coronary atherosclerosis 11/04/2007   Lumbago 11/04/2007   THYROIDECTOMY, HX OF 11/04/2007   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    PCP: Tresa Garter, MD  REFERRING PROVIDER: Guillermina City, PA-C  REFERRING DIAG: [phone] (ICD-10-CM) - Right knee pain M25.562 (ICD-10-CM) - Left knee pain R60.0 Bilateral edema of lower extremity  THERAPY DIAG:  Chronic pain of both knees  Other abnormalities of gait and mobility  Muscle weakness (generalized)  Rationale for Evaluation and Treatment: Rehabilitation  ONSET DATE: over a year ago  SUBJECTIVE:   SUBJECTIVE  STATEMENT: 11/18/2023 Pt states he was a little flared up Saturday -- did yard work after doing PT on Friday. Pt states pain reduced over the weekend. Saw vascular and veins are doing okay but recommended compression socks for edema. Will be seeing dietician.   From initial evaluation:  Pt states he has BIL knee pain (L>R) for over a year at this point, first noticed difficulty with floor transfers and then worsened to the point where the majority of transfers bothered knees to some extent. Particularly bothersome with stairs. He denies any issues with swelling or N/T. He does endorse some chronic ankle swelling bilaterally that he is in communication w/ providers about. We also discuss his complex medical history in depth today, particularly in regard to his back for which he states he will likely be receiving an epidural at some point for. He does mention that he received BIL knee injections ~3 days ago with about 50% relief.   PERTINENT HISTORY: anemia, anxiety, bipolar 1, BPH, basal cell carcinoma, other hx of cancer, CVID, depression, GERD, HTN, PVD, vertigo, thoracic aortic aneurysm, hx thyroidectomy, hx lumbar fusion  PAIN:  Are you having pain: yes Location/description: BIL knees (R>L), anterior knee  NPRS 3/10  - aggravating factors: stair navigation, kneeling, floor transfers, transfers from lower surfaces  - Easing factors: injections, positional changes    PRECAUTIONS: cancer history, aortic aneurysm (no isometrics)  WEIGHT BEARING RESTRICTIONS: No  FALLS:  Has patient fallen in last 6 months? 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 Does have some limitations due to back issues  PATIENT GOALS: would like to be more  active  NEXT MD VISIT: TBD, plans to follow with Dr. Allena Katz beginning of April   OBJECTIVE:  Note: Objective measures were completed  at Evaluation unless otherwise noted.  DIAGNOSTIC FINDINGS:  Recent MRI (09/29/23) for lumbar spine, no recent LE imaging in chart - pt states he had XR done through [facility], showed arthritis and edema in both knees, states they didn't feel he needed an MRI   PATIENT SURVEYS:  LEFS 38/80  POSTURE: fwd head, rounded shoulders  PALPATION: Concordant tenderness anterior knees BIL, along joint line and patellar tendons. R is more tender to the touch than the L although the L is reportedly more symptomatic   LOWER EXTREMITY ROM:      Right eval Left eval  Hip flexion    Hip extension    Hip internal rotation    Hip external rotation    Knee extension full Full (passively, does have mild limitation actively against gravity)   Knee flexion 130 deg * 110 deg *  (Blank rows = not tested) (Key: WFL = within functional limits not formally assessed, * = concordant pain, s = stiffness/stretching sensation, NT = not tested)  Comments:    LOWER EXTREMITY MMT:    MMT Right eval Left eval  Hip flexion 4 4-  Hip abduction (modified sitting) 4 4  Hip internal rotation    Hip external rotation    Knee flexion 4+ 4  Knee extension 4+ 4  Ankle dorsiflexion 4 4   (Blank rows = not tested) (Key: WFL = within functional limits not formally assessed, * = concordant pain, s = stiffness/stretching sensation, NT = not tested)  Comments:     FUNCTIONAL TESTS:  5xSTS: 12.7sec no UE support, crepitus BIL, L knee pain  GAIT: Distance walked: within clinic Assistive device utilized: None Level of assistance: Complete Independence Comments: widened BOS, reduced truncal rotation, reduced knee ROM BIL throughout all phases of gait but more prominent on L                                                                                                                                  TREATMENT DATE:  [facility] Adult PT Treatment:                                                DATE: 11/18/23 Therapeutic Exercise: Upright bike, L4 x 6 min for warm up Seated hamstring stretch 2x30" Standing quad stretch with strap 2x30" Standing gastroc stretch x30" Supine SLR x10, with ER x10 Supine bridge x10, with marching x10 Therapeutic Activity: Forward step up on 4" step 2x10 focusing on glute activation Side step up on 4" step 2x10 focusing on glute activation    [facility] Adult PT Treatment:  DATE: 11/14/23 Therapeutic Exercise: Nu step L5 LE only during subjective  Seated leg curl machine 10# BIL 2x12 Seated leg extension machine 10# 2x8 BIL  Seated calf ext machine 10# 10 BIL Increased time spent w/ education/discussion re: safety w/ setting up gym equipment, principles of basic progression  Therapeutic Activity: STS 3x5 from standard chair cues for pacing and mechanics  6 inch step weight shift x10 BIL cues for mechanics    [facility] Adult PT Treatment:                                                DATE: 11/11/23 Therapeutic Exercise: Standing green band TKE 3x10BIL Green band hamstring curl 2x8 Seated hamstring stretch 3x30 sec BIL w/ stool prop  HEP discussion/education  Neuromuscular re-ed: Unresisted LAQ x12 BIL emphasis on quad contraction 4 inch fwd step up 2x8, UE support and cues for quad control    [facility] Adult PT Treatment:                                                DATE: 11/06/23 Therapeutic Exercise: Upright bike, L7-10 x 6 min for warm up SLR each LE 2 x 10 Bridge with core engaged 2 x 10 Sidelying hip abdct x 10 R/L, repeated on LLE  Seated LAQ 2# x 10 (audible crepitus on RLE) each LE;  x 5 Standing TKE with green band x 5 sec hold x 10 each LE  Seated quad stretch with single foot under chair, sliding hips forward x 15s x 2 each LE Seated hamstring stretch x 20s x 1 each LE, cues for  posture  PATIENT EDUCATION:  Education details: rationale for interventions, HEP  Person educated: Patient Education method: Explanation, Demonstration, Tactile cues, Verbal cues Education comprehension: verbalized understanding, returned demonstration, verbal cues required, tactile cues required, and needs further education     HOME EXERCISE PROGRAM: Access Code: [email or web address] URL: [email or web address]  ASSESSMENT:  CLINICAL IMPRESSION: 11/18/2023 Treatment session focused on increasing glute activation during stair ascent. Progressed pt's HEP.    From Initial evaluation: Patient is a pleasant 68 y.o. gentleman who was seen today for physical therapy evaluation and treatment for bilateral knee pain ongoing over past year, much improved with recent injections. Primarily limited in closed chain activities involving knee flexion such as transfers and stair navigation. On exam he demonstrates concordant limitations in knee mobility and strength, L more affected than R. His 5xSTS time is right at cutoff score for fall risk in majority of populations (12-14sec, varying) and elicits mild knee pain. No adverse events, tolerates exam/HEP well without increase in resting pain. Recommend trial of skilled PT to address aforementioned deficits with aim of improving functional tolerance and reducing pain with typical activities. Pt departs today's session in no acute distress, all voiced concerns/questions addressed appropriately from PT perspective.       OBJECTIVE IMPAIRMENTS: Abnormal gait, decreased activity tolerance, decreased endurance, decreased mobility, difficulty walking, decreased ROM, decreased strength, postural dysfunction, and pain.   ACTIVITY LIMITATIONS: standing, squatting, stairs, transfers, and locomotion level  PARTICIPATION LIMITATIONS: meal prep, cleaning, laundry, and community activity  PERSONAL FACTORS: Age, Time since onset of injury/illness/exacerbation, and 3+  comorbidities: anemia, anxiety, bipolar 1, BPH, basal cell  carcinoma, other hx of cancer, CVID, depression, GERD, HTN, PVD, vertigo, thoracic aortic aneurysm, hx thyroidectomy, hx lumbar fusion  are also affecting patient's functional outcome.   REHAB POTENTIAL: Fair given chronicity and comorbidities  CLINICAL DECISION MAKING: Evolving/moderate complexity  EVALUATION COMPLEXITY: Moderate   GOALS:   SHORT TERM GOALS: Target date: 11/14/2023  Pt will demonstrate appropriate understanding and performance of initially prescribed HEP in order to facilitate improved independence with management of symptoms.  Baseline: HEP established  11/14/23: reports good HEP adherence  Goal status: MET  2. Pt will report at least 25% improvement in overall pain levels over past week in order to facilitate improved tolerance to typical daily activities.   Baseline: 0-8/10 Goal status: ONGOING  LONG TERM GOALS: Target date: 12/12/2023  Pt will score 50/80 or greater on LEFS in order to demonstrate improved perception of function due to symptoms (MCID 9 pts) Baseline: 38/80 Goal status: INITIAL  2.  Pt will demonstrate at least 120 degrees of knee flexion AROM bilaterally in order to facilitate improved tolerance to gait/transfers.  Baseline: see ROM chart above Goal status: INITIAL  3.  Pt will report at least 50% decrease in overall pain levels in past week in order to facilitate improved tolerance to basic ADLs/mobility.   Baseline: 0-8/10  Goal status: INITIAL    4.  Pt will be able to perform 5xSTS in less than or equal to 10 in order to demonstrate reduced fall risk and improved functional independence (MCID 5xSTS = 2.3 sec). Baseline: 12sec no UE support Goal status: INITIAL   5. Pt will report/demonstrate ability to navigate at least 13 steps w/o UE support and less than 3 pt increase in pain in order to facilitate improved tolerance to home navigation  Baseline: pain with stair  navigation  Goal status: INITIAL   PLAN:  PT FREQUENCY: 1-2x/week  PT DURATION: 8 weeks  PLANNED INTERVENTIONS: 97164- PT Re-evaluation, 97110-Therapeutic exercises, 97530- Therapeutic activity, 97112- Neuromuscular re-education, 97535- Self Care, 16109- Manual therapy, [id number]- Gait training, [phone]- Aquatic Therapy, Patient/Family education, Balance training, Stair training, Taping, Dry Needling, Joint mobilization, Cryotherapy, and Moist heat  PLAN FOR NEXT SESSION: Review/update HEP PRN. Work on [employer] exercises as appropriate with emphasis on quad activation, knee flexion mobility (especially on L), closed chain stability and functional mechanics. Symptom modification strategies as indicated/appropriate. Mindful of complex medical history including aortic aneurysm, cancer. Interested in using gym equipment  [facility] April Dell P[place], [place], DPT 11/18/2023 1:33 PM    [facility] [address] [place], [name], [address] Phone: [phone]   Fax:  [phone]

## 2023-11-19 ENCOUNTER — Ambulatory Visit (INDEPENDENT_AMBULATORY_CARE_PROVIDER_SITE_OTHER): Admitting: Licensed Clinical Social Worker

## 2023-11-19 DIAGNOSIS — F319 Bipolar disorder, unspecified: Secondary | ICD-10-CM

## 2023-11-19 DIAGNOSIS — C61 Malignant neoplasm of prostate: Secondary | ICD-10-CM | POA: Diagnosis not present

## 2023-11-20 ENCOUNTER — Ambulatory Visit (HOSPITAL_BASED_OUTPATIENT_CLINIC_OR_DEPARTMENT_OTHER): Admitting: Physical Therapy

## 2023-11-20 DIAGNOSIS — M6281 Muscle weakness (generalized): Secondary | ICD-10-CM

## 2023-11-20 DIAGNOSIS — G8929 Other chronic pain: Secondary | ICD-10-CM | POA: Diagnosis not present

## 2023-11-20 DIAGNOSIS — M25561 Pain in right knee: Secondary | ICD-10-CM | POA: Diagnosis not present

## 2023-11-20 DIAGNOSIS — M12572 Traumatic arthropathy, left ankle and foot: Secondary | ICD-10-CM | POA: Diagnosis not present

## 2023-11-20 DIAGNOSIS — M25562 Pain in left knee: Secondary | ICD-10-CM | POA: Diagnosis not present

## 2023-11-20 DIAGNOSIS — R2689 Other abnormalities of gait and mobility: Secondary | ICD-10-CM

## 2023-11-20 DIAGNOSIS — M19072 Primary osteoarthritis, left ankle and foot: Secondary | ICD-10-CM | POA: Diagnosis not present

## 2023-11-20 NOTE — Therapy (Signed)
 OUTPATIENT PHYSICAL THERAPY LOWER EXTREMITY TREATMENT   Patient Name: Alex [name] MRN: [id number] DOB:[birth year], 68 y.o., male Today's Date: 11/20/2023  END OF SESSION:  PT End of Session - 11/20/23 1300     Visit Number 7    Number of Visits 17    Date for PT Re-Evaluation 12/12/23    Authorization Type [employer]    PT Start Time 1300    PT Stop Time 1340    PT Time Calculation (min) 40 min    Activity Tolerance Patient tolerated treatment well              Past Medical History:  Diagnosis Date   Anemia    Anxiety    Atypical nevus 04/12/1997   dyplastic-left chest below nipple   Atypical nevus 01/18/2005   slight-mod-mid upper abd, slight-mod-right lateral chest-(WS), slight-mod-mid lower back (punch)   Atypical nevus 05/31/2005   dysplastic-central lowerback (exc), dysplastic- right abdomen (Exc)   Basal cell carcinoma 06/04/2016   back of neck   Bipolar I disorder (HCC)    Bleeding ulcer 2016   BPH (benign prostatic hypertrophy) with urinary obstruction    Cancer (HCC)    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 Burna Cash, oncology. Receives monthly IVIG 40 grams.   Depression    Epicondylitis    right elbow   GERD (gastroesophageal reflux disease)    Glaucoma BOTH EYES   RIGHT EYE RADIATION DAMAGE   Hearing loss    Bilateral   Hepatic cyst    Several, The lesion of concern in segment 6 of the liver has single large portal vein and hepatic vein branches  extending to tt, in a pattern of enhancement which mirrors these vascular structures. 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 9 CM X 4. 3 CM PER NOV 06-14-17  CHEST CTFOLOWED BY DR CRENSHAW YEARLY FOR   Positional vertigo    HX OF WITH SINUS INFECTIONS  Prostate cancer [facility])    Follows w/ Dr. Maisie Fus Polascik @ [facility].   Radial head fracture    Right   Squamous cell carcinoma of skin 06/04/2016   in situ-crown of scalp   Squamous cell carcinoma of skin 04/22/2017   in situ-crown scalp (txpbx)   Thoracic aortic aneurysm (HCC) 06/14/2017   last CT 4.1 CM Mild   Tinnitus    CONSTANT   Ulnar nerve compression    right elbow   Unsteady gait    especially with stairs, depth perception off   Urinary hesitancy    Wears glasses    Past Surgical History:  Procedure Laterality Date   CARDIAC CATHETERIZATION  01-16-2006  DR Maisie Fus WALL   MILD CORONARY ATHEROSCLEROSIS/ MID TO DISTAL LAD 40% STENOSIS/ LVF 50-55%   CARPAL TUNNEL RELEASE Right 11/03/2017   Procedure: RIGHT HAND CARPAL TUNNEL RELEASE;  Surgeon: Bradly Bienenstock, MD;  Location: [facility] [place];   Service: Orthopedics;  Laterality: Right;   CATARACT EXTRACTION Right    COLONSCOPY  2017 LAST DONE   MULTIPLE   CYST EXCISION Right 09/04/2023   Procedure: EXCISION CYST RIGHT SHOULDER;  Surgeon: Darnell Level, MD;  Location: [facility] [place];  Service: General;  Laterality: Right;  LOCAL & MAC   ENDOSCOPY  LAST 2017   MULTIPLE DONE DILATION DONE ALSO   ESOPHAGOGASTRODUODENOSCOPY (EGD) WITH PROPOFOL N/A 02/24/2018   Procedure: ESOPHAGOGASTRODUODENOSCOPY (EGD) WITH PROPOFOL;  Surgeon: Carman Ching, MD;  Location: [facility];  Service: Endoscopy;  Laterality: N/A;   EXCISION RADIAL HEAD Right 11/03/2017   Procedure: RIGHT PROXIMAL RADIUS RADIAL HEAD RESECTION AND JOINT DEBRIDEMENT;  Surgeon: Bradly Bienenstock, MD;  Location: [facility] [place];  Service: Orthopedics;  Laterality: Right;   EXTRACORPOREAL SHOCK WAVE LITHOTRIPSY Right 11/20/2020   Procedure: EXTRACORPOREAL SHOCK WAVE LITHOTRIPSY (ESWL);  Surgeon: Sebastian Ache, MD;  Location: [facility];  Service: Urology;  Laterality: Right;  75 MINS   KNEE ARTHROSCOPY  05/01/2012   Procedure: ARTHROSCOPY KNEE;  Surgeon: Javier Docker, MD;  Location: [facility];  Service: Orthopedics;  Laterality: Left;  debridement and removal of loose body   LEFT ANKLE ARTHROSCOPY W/ DEBRIDEMENT  05/12/2007   LEFT HYDROCELECTOMY  03/29/2005   AND REPAIR LEFT INGUINAL HERNIA W/ MESH   MOHS SURGERY  2002   RIGHT ORBITAL CANCER   NASAL ENDOSCOPY  08/07/2005   RIGHT EPISTAXIS  / POST SEPTOPLASTY  (HX RIGHT ORBITAL CA & S/P RADIATION/ NECROSIS ANTERIOR END OF BOTH INFERIOR TURBINATES)   occuloplastic surgery  2002   PARS PLANA VITRECTOMY  11/06/2004   RIGHT EYE RADIATION RETINOPATHY W/ HEMORRHAGE   PROSTATE ABLATION N/A 06/2022   RADIAL HEAD ARTHROPLASTY Right 06/15/2018   Procedure: RIGHT ELBOW PROXIMAL RADIOULNAR JOINT DEBRIDEMENT AND ARTHROPLASTY;  Surgeon: Bradly Bienenstock, MD;  Location: [facility];  Service: Orthopedics;  Laterality: Right;  BLOCK WITH SEDATION   REPAIR UNDESENDED RIGHT TESTICLE / RIGHT INGUINAL HERNIA  AGE 68   RIGHT ANKLE ARTHROSCOPY W/ EXTENSIVE DEBRIDEMENT  04/05/2008   x2   RIGHT SHOULDER SURGERY  2006   RIGHT SUPRAOMOHYOID NECK DISSECTION   03/08/2003   ZONES 1,2,3;   SUBMANDIBULAR MASS / METASTATIC SQUAMOUS CELL CARCINOMA RIGHT NECK   SAVORY DILATION N/A 02/24/2018   Procedure: SAVORY DILATION;  Surgeon: Carman Ching, MD;  Location: [facility];  Service: Endoscopy;  Laterality: N/A;   SEPTOPLASTY  06/2005   SHOULDER ARTHROSCOPY Left    SHOULDER ARTHROSCOPY  W/ SUBACROMIAL DECOMPRESSION AND DISTAL CLAVICLE EXCISION  10/09/2008   AND DEBRIDEMENT OF RIGHT SHOULDER IMPINGEMENT & AC JOINT ARTHRITIS   SPINE SURGERY  2016   l 3 TO l 4 PLATE AND SCREWS   TOTAL THYROIDECTOMY  11/03/2001   PAPILLARY THYROID CARCINOMA   TRANSTHORACIC ECHOCARDIOGRAM  07/2011   grade I diastolic dysfunction/ ef 55-60%   ULNAR NERVE TRANSPOSITION Right 04/28/2014   Procedure: RIGHT ELBOW ULNA NERVE RELEASE TRANSPOSTION AND MEDIAL EPICONDYLAR DEBRIDEMENT AND REPAIR;  Surgeon: Sharma Covert, MD;  Location: [facility];  Service: Orthopedics;  Laterality: Right;   Patient Active Problem List   Diagnosis Date Noted   Primary osteoarthritis of both knees 09/22/2023   Boil 08/26/2023   Abscess 08/20/2023   Cellulitis 05/12/2023   Bruise of lower lip 04/17/2023   Wheezing 03/31/2023   Injury of medial collateral ligament (MCL) of knee 02/07/2023   Sprain of medial collateral ligament of right knee 02/07/2023   Osteoarthritis of subtalar joint 09/25/2022   Cystoid macular edema of right eye 07/18/2022   Hyperglycemia 07/18/2022   Nonexudative age-related macular degeneration, left eye, early dry stage 07/18/2022   Retinal edema 07/18/2022   Superficial punctate keratitis of right eye 07/18/2022   Grief at loss of child 05/22/2022   Actinic keratoses 05/22/2022    Pain in joint of left elbow 04/04/2022   Abnormal defecation 02/26/2022   Atrophic gastritis 02/26/2022   Bipolar 1 disorder (HCC) 02/26/2022   BPH (benign prostatic hyperplasia) 02/26/2022   Bronchiolectasis (HCC) 02/26/2022   Disease of thyroid gland 02/26/2022   Diverticular disease of colon 02/26/2022   Family history of malignant neoplasm of digestive organs 02/26/2022   Hyperlipidemia 02/26/2022   IgG deficiency (HCC) 02/26/2022   Immune deficiency disorder (HCC) 02/26/2022   Irritable bowel syndrome 02/26/2022   Pancreatic cyst 02/26/2022   History of colonic polyps 02/26/2022   Portosystemic shunt, 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/26/2022   Pyloric ulcer 02/26/2022   Scoliosis 02/26/2022   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   Spinal stenosis of lumbar region 02/26/2022   Prostate cancer (HCC) 02/26/2022   Dysuria 10/03/2021   Earache 06/14/2021   Anemia 06/14/2021   History of head and neck cancer 03/28/2021   Swelling of right parotid gland 03/28/2021   Epistaxis, recurrent 06/26/2020   PMR (polymyalgia rheumatica) (HCC) 02/22/2020   Diverticulitis 01/25/2020   Abdominal pain 01/25/2020   Iliac vessel injury 11/30/2019   Stress at home 09/14/2019   LLQ abdominal pain 09/06/2019   IPMN (intraductal papillary mucinous neoplasm) 07/19/2019   History of thyroid cancer 06/03/2019   Abnormal findings on diagnostic imaging of liver 06/03/2019   Epididymitis 03/16/2019   Difficulty urinating 03/16/2019   Pain in right foot 03/12/2019   Hypothyroidism (acquired) 09/29/2018   Moderate persistent asthma with acute exacerbation 09/29/2018   Prediabetes 09/29/2018   RTI (respiratory tract infection) 09/29/2018   ETD (Eustachian tube dysfunction), bilateral 09/02/2018   Sensorineural hearing loss (SNHL) of both ears 07/31/2018   Bruising 11/17/2017   Encounter for other orthopedic aftercare 11/14/2017   Cicatricial lagophthalmos of left lower eyelid  10/31/2017   Closed fracture of head of right radius 10/24/2017   Palatal mass 09/16/2017   Sore throat 09/16/2017   Testicular pain, right 08/01/2017   Combined form of age-related cataract, left eye 05/19/2017   Dry eye syndrome of both eyes 05/19/2017   Primary open angle glaucoma (POAG) of left eye, mild stage 05/19/2017   Radiation retinopathy,  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   Secondary glaucoma due to combination mechanisms, right, severe stage 05/19/2017   Fatigue 01/21/2017   Ankle pain, left 01/21/2017   Fall (on) (from) other stairs and steps, initial encounter 11/11/2016   Abrasion of head 11/11/2016   Ascending aortic aneurysm (HCC) 07/23/2016   Erectile dysfunction 07/19/2016   Bronchiectasis (HCC) 04/23/2016   Chronic bronchitis (HCC) 03/11/2016   Cerumen impaction 08/04/2015   Bilateral impacted cerumen 08/04/2015   Cervical disc disorder with radiculopathy of cervical region 06/06/2014   Elevated WBC count 06/06/2014   Iron deficiency anemia 06/06/2014   Edema 02/21/2014   Tinnitus 02/21/2014   Well adult exam 05/31/2013   Glaucoma 05/31/2013   RML pneumonia 03/31/2013   Hypertension, uncontrolled 02/25/2013   Anxiety disorder 08/08/2010   Chest pain, atypical 02/08/2010   ESOPHAGEAL STRICTURE 11/30/2009   Osteoarthritis 08/07/2009   Malaise and fatigue 05/14/2008   Coronary atherosclerosis 11/04/2007   Lumbago 11/04/2007   THYROIDECTOMY, HX OF 11/04/2007   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    PCP: Tresa Garter, MD  REFERRING PROVIDER: Guillermina City, PA-C  REFERRING DIAG: [phone] (ICD-10-CM) - Right knee pain M25.562 (ICD-10-CM) - Left knee pain R60.0 Bilateral edema of lower extremity  THERAPY DIAG:  Chronic pain of both knees  Other abnormalities of gait and mobility  Muscle weakness (generalized)  Rationale for Evaluation and Treatment: Rehabilitation  ONSET DATE: over a year ago  SUBJECTIVE:   SUBJECTIVE  STATEMENT: 11/20/2023 Pt states his knees are doing pretty good. Reports his steps at home are narrow so did not have enough room to get his foot completely flat. Pt states ultrasound of his LEs show venous insufficiency and no DVTs.   From initial evaluation:  Pt states he has BIL knee pain (L>R) for over a year at this point, first noticed difficulty with floor transfers and then worsened to the point where the majority of transfers bothered knees to some extent. Particularly bothersome with stairs. He denies any issues with swelling or N/T. He does endorse some chronic ankle swelling bilaterally that he is in communication w/ providers about. We also discuss his complex medical history in depth today, particularly in regard to his back for which he states he will likely be receiving an epidural at some point for. He does mention that he received BIL knee injections ~3 days ago with about 50% relief.   PERTINENT HISTORY: anemia, anxiety, bipolar 1, BPH, basal cell carcinoma, other hx of cancer, CVID, depression, GERD, HTN, PVD, vertigo, thoracic aortic aneurysm, hx thyroidectomy, hx lumbar fusion  PAIN:  Are you having pain: yes Location/description: BIL knees (R>L), anterior knee  NPRS 3/10  - aggravating factors: stair navigation, kneeling, floor transfers, transfers from lower surfaces  - Easing factors: injections, positional changes    PRECAUTIONS: cancer history, aortic aneurysm (no isometrics)  WEIGHT BEARING RESTRICTIONS: No  FALLS:  Has patient fallen in last 6 months? 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 Does have some limitations due to back issues  PATIENT GOALS: would like to be more active  NEXT MD VISIT: TBD, plans  to follow with Dr. Allena Katz beginning of April   OBJECTIVE:  Note: Objective measures were completed at Evaluation unless  otherwise noted.  DIAGNOSTIC FINDINGS:  Recent MRI (09/29/23) for lumbar spine, no recent LE imaging in chart - pt states he had XR done through [facility], showed arthritis and edema in both knees, states they didn't feel he needed an MRI   PATIENT SURVEYS:  LEFS 38/80  POSTURE: fwd head, rounded shoulders  PALPATION: Concordant tenderness anterior knees BIL, along joint line and patellar tendons. R is more tender to the touch than the L although the L is reportedly more symptomatic   LOWER EXTREMITY ROM:      Right eval Left eval  Hip flexion    Hip extension    Hip internal rotation    Hip external rotation    Knee extension full Full (passively, does have mild limitation actively against gravity)   Knee flexion 130 deg * 110 deg *  (Blank rows = not tested) (Key: WFL = within functional limits not formally assessed, * = concordant pain, s = stiffness/stretching sensation, NT = not tested)  Comments:    LOWER EXTREMITY MMT:    MMT Right eval Left eval  Hip flexion 4 4-  Hip abduction (modified sitting) 4 4  Hip internal rotation    Hip external rotation    Knee flexion 4+ 4  Knee extension 4+ 4  Ankle dorsiflexion 4 4   (Blank rows = not tested) (Key: WFL = within functional limits not formally assessed, * = concordant pain, s = stiffness/stretching sensation, NT = not tested)  Comments:     FUNCTIONAL TESTS:  5xSTS: 12.7sec no UE support, crepitus BIL, L knee pain  GAIT: Distance walked: within clinic Assistive device utilized: None Level of assistance: Complete Independence Comments: widened BOS, reduced truncal rotation, reduced knee ROM BIL throughout all phases of gait but more prominent on L                                                                                                                                 TREATMENT DATE:   [facility] Adult PT Treatment:                                                DATE: 11/20/23 Therapeutic Exercise: Nustep L6 x 5 min LEs Quad stretch with strap x30" Gastroc stretch standing x30" Heel raise off step 2x10 Therapeutic Activity: kneel to tall kneel x10, with 10# KB x10 Tall kneel to half kneel x5 Half kneel rock fwd/bwd with glute setting x20 Half kneel to lunge x10 Side step up stairs working on glute activation for his stairs at home 3x3 steps Eccentric step down 2x10    [facility] Adult PT Treatment:  DATE: 11/18/23 Therapeutic Exercise: Upright bike, L4 x 6 min for warm up Seated hamstring stretch 2x30" Standing quad stretch with strap 2x30" Standing gastroc stretch x30" Supine SLR x10, with ER x10 Supine bridge x10, with marching x10 Therapeutic Activity: Forward step up on 4" step 2x10 focusing on glute activation Side step up on 4" step 2x10 focusing on glute activation    [facility] Adult PT Treatment:                                                DATE: 11/14/23 Therapeutic Exercise: Nu step L5 LE only during subjective  Seated leg curl machine 10# BIL 2x12 Seated leg extension machine 10# 2x8 BIL  Seated calf ext machine 10# 10 BIL Increased time spent w/ education/discussion re: safety w/ setting up gym equipment, principles of basic progression  Therapeutic Activity: STS 3x5 from standard chair cues for pacing and mechanics  6 inch step weight shift x10 BIL cues for mechanics    [facility] Adult PT Treatment:                                                DATE: 11/11/23 Therapeutic Exercise: Standing green band TKE 3x10BIL Green band hamstring curl 2x8 Seated hamstring stretch 3x30 sec BIL w/ stool prop  HEP discussion/education  Neuromuscular re-ed: Unresisted LAQ x12 BIL emphasis on quad contraction 4 inch fwd step up 2x8, UE support and cues for quad control   PATIENT EDUCATION:  Education details: rationale for  interventions, HEP  Person educated: Patient Education method: Explanation, Demonstration, Tactile cues, Verbal cues Education comprehension: verbalized understanding, returned demonstration, verbal cues required, tactile cues required, and needs further education     HOME EXERCISE PROGRAM: Access Code: [email or web address] URL: [email or web address]  ASSESSMENT:  CLINICAL IMPRESSION: 11/20/2023 Continued to work on improving quad strength and control. Focused on improving glute activation with stairs and deep knee bends to decrease anterior knee pressure and pain.    From Initial evaluation: Patient is a pleasant 68 y.o. gentleman who was seen today for physical therapy evaluation and treatment for bilateral knee pain ongoing over past year, much improved with recent injections. Primarily limited in closed chain activities involving knee flexion such as transfers and stair navigation. On exam he demonstrates concordant limitations in knee mobility and strength, L more affected than R. His 5xSTS time is right at cutoff score for fall risk in majority of populations (12-14sec, varying) and elicits mild knee pain. No adverse events, tolerates exam/HEP well without increase in resting pain. Recommend trial of skilled PT to address aforementioned deficits with aim of improving functional tolerance and reducing pain with typical activities. Pt departs today's session in no acute distress, all voiced concerns/questions addressed appropriately from PT perspective.       OBJECTIVE IMPAIRMENTS: Abnormal gait, decreased activity tolerance, decreased endurance, decreased mobility, difficulty walking, decreased ROM, decreased strength, postural dysfunction, and pain.   ACTIVITY LIMITATIONS: standing, squatting, stairs, transfers, and locomotion level  PARTICIPATION LIMITATIONS: meal prep, cleaning, laundry, and community activity  PERSONAL FACTORS: Age, Time since onset of injury/illness/exacerbation,  and 3+ comorbidities: anemia, anxiety, bipolar 1, BPH, basal cell carcinoma, other hx of cancer, CVID, depression, GERD, HTN, PVD, vertigo, thoracic aortic aneurysm, hx  thyroidectomy, hx lumbar fusion  are also affecting patient's functional outcome.   REHAB POTENTIAL: Fair given chronicity and comorbidities  CLINICAL DECISION MAKING: Evolving/moderate complexity  EVALUATION COMPLEXITY: Moderate   GOALS:   SHORT TERM GOALS: Target date: 11/14/2023  Pt will demonstrate appropriate understanding and performance of initially prescribed HEP in order to facilitate improved independence with management of symptoms.  Baseline: HEP established  11/14/23: reports good HEP adherence  Goal status: MET  2. Pt will report at least 25% improvement in overall pain levels over past week in order to facilitate improved tolerance to typical daily activities.   Baseline: 0-8/10 Goal status: ONGOING  LONG TERM GOALS: Target date: 12/12/2023  Pt will score 50/80 or greater on LEFS in order to demonstrate improved perception of function due to symptoms (MCID 9 pts) Baseline: 38/80 Goal status: INITIAL  2.  Pt will demonstrate at least 120 degrees of knee flexion AROM bilaterally in order to facilitate improved tolerance to gait/transfers.  Baseline: see ROM chart above Goal status: INITIAL  3.  Pt will report at least 50% decrease in overall pain levels in past week in order to facilitate improved tolerance to basic ADLs/mobility.   Baseline: 0-8/10  Goal status: INITIAL    4.  Pt will be able to perform 5xSTS in less than or equal to 10 in order to demonstrate reduced fall risk and improved functional independence (MCID 5xSTS = 2.3 sec). Baseline: 12sec no UE support Goal status: INITIAL   5. Pt will report/demonstrate ability to navigate at least 13 steps w/o UE support and less than 3 pt increase in pain in order to facilitate improved tolerance to home navigation  Baseline: pain with stair  navigation  Goal status: INITIAL   PLAN:  PT FREQUENCY: 1-2x/week  PT DURATION: 8 weeks  PLANNED INTERVENTIONS: [id number]- PT Re-evaluation, 97110-Therapeutic exercises, 97530- Therapeutic activity, 97112- Neuromuscular re-education, 97535- Self Care, [id number]- Manual therapy, [id number]- Gait training, [phone]- Aquatic Therapy, Patient/Family education, Balance training, Stair training, Taping, Dry Needling, Joint mobilization, Cryotherapy, and Moist heat  PLAN FOR NEXT SESSION: Review/update HEP PRN. Work on [employer] exercises as appropriate with emphasis on quad activation, knee flexion mobility (especially on L), closed chain stability and functional mechanics. Symptom modification strategies as indicated/appropriate. Mindful of complex medical history including aortic aneurysm, cancer. Interested in using gym equipment  [facility] April Dell Ponto, [place], DPT 11/20/2023 1:00 PM    [facility] [address] [place], [name], [address] Phone: [phone]   Fax:  [phone]

## 2023-11-24 ENCOUNTER — Encounter (HOSPITAL_COMMUNITY): Payer: Self-pay | Admitting: Licensed Clinical Social Worker

## 2023-11-25 ENCOUNTER — Other Ambulatory Visit: Payer: Self-pay

## 2023-11-25 ENCOUNTER — Ambulatory Visit (HOSPITAL_BASED_OUTPATIENT_CLINIC_OR_DEPARTMENT_OTHER): Admitting: Physical Therapy

## 2023-11-25 DIAGNOSIS — R600 Localized edema: Secondary | ICD-10-CM

## 2023-11-25 DIAGNOSIS — E119 Type 2 diabetes mellitus without complications: Secondary | ICD-10-CM

## 2023-11-25 NOTE — Progress Notes (Signed)
 Virtual virtual Video Note  I connected with Alex [name] on 11/19/2023 at 2-3pm EST by video-enabled virtual visit. I verified that I am speaking with the correct person using  two identifiers.I discussed the limitations of evaluation and management by telemedicine and the availability of in person appointments. The patient expressed understanding and agreed to proceed.    LOCATION: Patient: Home  Provider: Home office   History of Present Illness:  Pt was referred by Dr. Lolly [name] for OP therapy for bipolar disorder and anxiety.  Treatment Goal Addressed:  Pt will meet with clinician weekly for therapy to monitor for progress towards goals and address any barriers to success; Reduce depression from average severity level of 6/10 down to a 4/10 in next 6 months by engaging in 1-2 positive coping skills daily as part of developing self-care routine; Reduce average anxiety level from 7/10 down to 5/10 in next 6 months by utilizing 1-2 relaxation skills/grounding skills per day, such as mindful breathing, progressive muscle relaxation, positive visualizations.  Progress towards Treatment Goal: Progressing   Observations/Objective: Patient presented for today's session on time and was alert, oriented x5, with no evidence or self-report of SI/HI or A/V H.  Patient reported ongoing compliance with medication and denied any use of alcohol or illicit substances.  Clinician inquired about patient's current emotional ratings, as well as any significant changes in thoughts, feelings or behavior since previous session. Patient reported scores of  8/10 for depression, 8/10 for anxiety, 3/10 for anger/irritability. [name] and pt explored his emotional ratings, and discussed coping skills for stressors. 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 Pt discussed his current health issues and his fear of the future due to his health.  Clinician utilized CBT to address thought processes support and confidence in his future plans.      Assessment and plan: Counselor will continue to meet with patient to address treatment plan goals. Patient will continue to follow recommendations of providers and imnd implement skills learned in session, and practice between sessions. Diagnosis: Bipolar 1 disorder.    Collaboration of care: Other:   Continue working with providers    Patient/Guardian was advised Release of Information must be obtained prior to any record release in order to collaborate their care with an outside provider. Patient/Guardian was advised if they have not already done so to contact the registration department to sign all necessary forms in order for [place] to release information regarding their care.    Consent: Patient/Guardian gives verbal consent for treatment and assignment of benefits for services provided during this visit. Patient/Guardian expressed understanding and agreed to proceed.     Follow Up Instructions:  I discussed the assessment and treatment plan with the patient. The patient was provided an opportunity to ask questions and all were answered. The patient agreed with the plan and demonstrated an understanding of the instructions.   The patient was advised to call back or seek an in-person evaluation if the symptoms worsen or if the condition fails to improve as anticipated.  I provided 60 minutes of non-face-to-face time during this encounter.   Alex [name] S, LCAS  11/19/23

## 2023-11-26 ENCOUNTER — Ambulatory Visit (HOSPITAL_COMMUNITY): Admitting: Licensed Clinical Social Worker

## 2023-11-26 ENCOUNTER — Ambulatory Visit (INDEPENDENT_AMBULATORY_CARE_PROVIDER_SITE_OTHER): Admitting: Licensed Clinical Social Worker

## 2023-11-26 DIAGNOSIS — N202 Calculus of kidney with calculus of ureter: Secondary | ICD-10-CM | POA: Diagnosis not present

## 2023-11-26 DIAGNOSIS — F319 Bipolar disorder, unspecified: Secondary | ICD-10-CM | POA: Diagnosis not present

## 2023-11-26 DIAGNOSIS — R351 Nocturia: Secondary | ICD-10-CM | POA: Diagnosis not present

## 2023-11-26 DIAGNOSIS — C61 Malignant neoplasm of prostate: Secondary | ICD-10-CM | POA: Diagnosis not present

## 2023-11-26 DIAGNOSIS — N401 Enlarged prostate with lower urinary tract symptoms: Secondary | ICD-10-CM | POA: Diagnosis not present

## 2023-11-27 ENCOUNTER — Encounter (HOSPITAL_BASED_OUTPATIENT_CLINIC_OR_DEPARTMENT_OTHER): Payer: Self-pay | Admitting: Physical Therapy

## 2023-11-27 ENCOUNTER — Ambulatory Visit (HOSPITAL_BASED_OUTPATIENT_CLINIC_OR_DEPARTMENT_OTHER): Admitting: Physical Therapy

## 2023-11-27 DIAGNOSIS — M19011 Primary osteoarthritis, right shoulder: Secondary | ICD-10-CM | POA: Diagnosis not present

## 2023-11-27 DIAGNOSIS — M6281 Muscle weakness (generalized): Secondary | ICD-10-CM

## 2023-11-27 DIAGNOSIS — G8929 Other chronic pain: Secondary | ICD-10-CM

## 2023-11-27 DIAGNOSIS — R2689 Other abnormalities of gait and mobility: Secondary | ICD-10-CM | POA: Diagnosis not present

## 2023-11-27 DIAGNOSIS — M25562 Pain in left knee: Secondary | ICD-10-CM | POA: Diagnosis not present

## 2023-11-27 DIAGNOSIS — M25561 Pain in right knee: Secondary | ICD-10-CM | POA: Diagnosis not present

## 2023-11-27 NOTE — Therapy (Signed)
 OUTPATIENT PHYSICAL THERAPY LOWER EXTREMITY TREATMENT   Patient Name: Alex [name] MRN: [id number] DOB:07-01-1956, 68 y.o., male Today's Date: 11/27/2023  END OF SESSION:  PT End of Session - 11/27/23 1259     Visit Number 8    Number of Visits 17    Date for PT Re-Evaluation 12/12/23    Authorization Type BC[employer]    PT Start Time 1300    PT Stop Time 1340    PT Time Calculation (min) 40 min    Activity Tolerance Patient tolerated treatment well             Past Medical History:  Diagnosis Date   Anemia    Anxiety    Atypical nevus 04/12/1997   dyplastic-left chest below nipple   Atypical nevus 01/18/2005   slight-mod-mid upper abd, slight-mod-right lateral chest-(WS), slight-mod-mid lower back (punch)   Atypical nevus 05/31/2005   dysplastic-central lowerback (exc), dysplastic- right abdomen (Exc)   Basal cell carcinoma 06/04/2016   back of neck   Bipolar I disorder (HCC)    Bleeding ulcer 2016   BPH (benign prostatic hypertrophy) with urinary obstruction    Cancer (HCC)    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 Burna Cash, oncology. Receives monthly IVIG 40 grams.   Depression    Epicondylitis    right elbow   GERD (gastroesophageal reflux disease)    Glaucoma BOTH EYES   RIGHT EYE RADIATION DAMAGE   Hearing loss    Bilateral   Hepatic cyst    Several, The lesion of concern in segment 6 of the liver has single large portal vein and hepatic vein branches  extending to tt, in a pattern of enhancement which mirrors these vascular structures. 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 9 CM X 4. 3 CM PER NOV 06-14-17  CHEST CTFOLOWED BY DR CRENSHAW YEARLY FOR   Positional vertigo    HX OF WITH SINUS INFECTIONS  Prostate cancer [facility])    Follows w/ Dr. Andy Bannister Polascik @ [facility].   Radial head fracture    Right   Squamous cell carcinoma of skin 06/04/2016   in situ-crown of scalp   Squamous cell carcinoma of skin 04/22/2017   in situ-crown scalp (txpbx)   Thoracic aortic aneurysm (HCC) 06/14/2017   last CT 4.1 CM Mild   Tinnitus    CONSTANT   Ulnar nerve compression    right elbow   Unsteady gait    especially with stairs, depth perception off   Urinary hesitancy    Wears glasses    Past Surgical History:  Procedure Laterality Date   CARDIAC CATHETERIZATION  01-16-2006  DR Andy Bannister WALL   MILD CORONARY ATHEROSCLEROSIS/ MID TO DISTAL LAD 40% STENOSIS/ LVF 50-55%   CARPAL TUNNEL RELEASE Right 11/03/2017   Procedure: RIGHT HAND CARPAL TUNNEL RELEASE;  Surgeon: Arvil Birks, MD;  Location: [facility];   Service: Orthopedics;  Laterality: Right;   CATARACT EXTRACTION Right    COLONSCOPY  2017 LAST DONE   MULTIPLE   CYST EXCISION Right 09/04/2023   Procedure: EXCISION CYST RIGHT SHOULDER;  Surgeon: Oralee Billow, MD;  Location: [facility];  Service: General;  Laterality: Right;  LOCAL & MAC   ENDOSCOPY  LAST 2017   MULTIPLE DONE DILATION DONE ALSO   ESOPHAGOGASTRODUODENOSCOPY (EGD) WITH PROPOFOL N/A 02/24/2018   Procedure: ESOPHAGOGASTRODUODENOSCOPY (EGD) WITH PROPOFOL;  Surgeon: Jolinda Necessary, MD;  Location: [facility];  Service: Endoscopy;  Laterality: N/A;   EXCISION RADIAL HEAD Right 11/03/2017   Procedure: RIGHT PROXIMAL RADIUS RADIAL HEAD RESECTION AND JOINT DEBRIDEMENT;  Surgeon: Arvil Birks, MD;  Location: [facility] ;  Service: Orthopedics;  Laterality: Right;   EXTRACORPOREAL SHOCK WAVE LITHOTRIPSY Right 11/20/2020   Procedure: EXTRACORPOREAL SHOCK WAVE LITHOTRIPSY (ESWL);  Surgeon: Osborn Blaze, MD;  Location: [facility];  Service: Urology;  Laterality: Right;  75 MINS   KNEE ARTHROSCOPY  05/01/2012   Procedure: ARTHROSCOPY KNEE;  Surgeon: Loel Ring, MD;  Location: [facility];  Service: Orthopedics;  Laterality: Left;  debridement and removal of loose body   LEFT ANKLE ARTHROSCOPY W/ DEBRIDEMENT  05/12/2007   LEFT HYDROCELECTOMY  03/29/2005   AND REPAIR LEFT INGUINAL HERNIA W/ MESH   MOHS SURGERY  2002   RIGHT ORBITAL CANCER   NASAL ENDOSCOPY  08/07/2005   RIGHT EPISTAXIS  / POST SEPTOPLASTY  (HX RIGHT ORBITAL CA & S/P RADIATION/ NECROSIS ANTERIOR END OF BOTH INFERIOR TURBINATES)   occuloplastic surgery  2002   PARS PLANA VITRECTOMY  11/06/2004   RIGHT EYE RADIATION RETINOPATHY W/ HEMORRHAGE   PROSTATE ABLATION N/A 06/2022   RADIAL HEAD ARTHROPLASTY Right 06/15/2018   Procedure: RIGHT ELBOW PROXIMAL RADIOULNAR JOINT DEBRIDEMENT AND ARTHROPLASTY;  Surgeon: Arvil Birks, MD;  Location: [facility];  Service: Orthopedics;  Laterality: Right;  BLOCK WITH SEDATION   REPAIR UNDESENDED RIGHT TESTICLE / RIGHT INGUINAL HERNIA  AGE 27   RIGHT ANKLE ARTHROSCOPY W/ EXTENSIVE DEBRIDEMENT  04/05/2008   x2   RIGHT SHOULDER SURGERY  2006   RIGHT SUPRAOMOHYOID NECK DISSECTION   03/08/2003   ZONES 1,2,3;   SUBMANDIBULAR MASS / METASTATIC SQUAMOUS CELL CARCINOMA RIGHT NECK   SAVORY DILATION N/A 02/24/2018   Procedure: SAVORY DILATION;  Surgeon: Jolinda Necessary, MD;  Location: [facility];  Service: Endoscopy;  Laterality: N/A;   SEPTOPLASTY  06/2005   SHOULDER ARTHROSCOPY Left    SHOULDER ARTHROSCOPY  W/ SUBACROMIAL DECOMPRESSION AND DISTAL CLAVICLE EXCISION  10/09/2008   AND DEBRIDEMENT OF RIGHT SHOULDER IMPINGEMENT & AC JOINT ARTHRITIS   SPINE SURGERY  2016   l 3 TO l 4 PLATE AND SCREWS   TOTAL THYROIDECTOMY  11/03/2001   PAPILLARY THYROID CARCINOMA   TRANSTHORACIC ECHOCARDIOGRAM  07/2011   grade I diastolic dysfunction/ ef 55-60%   ULNAR NERVE TRANSPOSITION Right 04/28/2014   Procedure: RIGHT ELBOW ULNA NERVE RELEASE TRANSPOSTION AND MEDIAL EPICONDYLAR DEBRIDEMENT AND REPAIR;  Surgeon: Shellie Dials, MD;  Location: [facility];  Service: Orthopedics;  Laterality: Right;   Patient Active Problem List   Diagnosis Date Noted   Primary osteoarthritis of both knees 09/22/2023   Boil 08/26/2023   Abscess 08/20/2023   Cellulitis 05/12/2023   Bruise of lower lip 04/17/2023   Wheezing 03/31/2023   Injury of medial collateral ligament (MCL) of knee 02/07/2023   Sprain of medial collateral ligament of right knee 02/07/2023   Osteoarthritis of subtalar joint 09/25/2022   Cystoid macular edema of right eye 07/18/2022   Hyperglycemia 07/18/2022   Nonexudative age-related macular degeneration, left eye, early dry stage 07/18/2022   Retinal edema 07/18/2022   Superficial punctate keratitis of right eye 07/18/2022   Grief at loss of child 05/22/2022   Actinic keratoses 05/22/2022    Pain in joint of left elbow 04/04/2022   Abnormal defecation 02/26/2022   Atrophic gastritis 02/26/2022   Bipolar 1 disorder (HCC) 02/26/2022   BPH (benign prostatic hyperplasia) 02/26/2022   Bronchiolectasis (HCC) 02/26/2022   Disease of thyroid gland 02/26/2022   Diverticular disease of colon 02/26/2022   Family history of malignant neoplasm of digestive organs 02/26/2022   Hyperlipidemia 02/26/2022   IgG deficiency (HCC) 02/26/2022   Immune deficiency disorder (HCC) 02/26/2022   Irritable bowel syndrome 02/26/2022   Pancreatic cyst 02/26/2022   History of colonic polyps 02/26/2022   Portosystemic shunt, 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/26/2022   Pyloric ulcer 02/26/2022   Scoliosis 02/26/2022   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   Spinal stenosis of lumbar region 02/26/2022   Prostate cancer (HCC) 02/26/2022   Dysuria 10/03/2021   Earache 06/14/2021   Anemia 06/14/2021   History of head and neck cancer 03/28/2021   Swelling of right parotid gland 03/28/2021   Epistaxis, recurrent 06/26/2020   PMR (polymyalgia rheumatica) (HCC) 02/22/2020   Diverticulitis 01/25/2020   Abdominal pain 01/25/2020   Iliac vessel injury 11/30/2019   Stress at home 09/14/2019   LLQ abdominal pain 09/06/2019   IPMN (intraductal papillary mucinous neoplasm) 07/19/2019   History of thyroid cancer 06/03/2019   Abnormal findings on diagnostic imaging of liver 06/03/2019   Epididymitis 03/16/2019   Difficulty urinating 03/16/2019   Pain in right foot 03/12/2019   Hypothyroidism (acquired) 09/29/2018   Moderate persistent asthma with acute exacerbation 09/29/2018   Prediabetes 09/29/2018   RTI (respiratory tract infection) 09/29/2018   ETD (Eustachian tube dysfunction), bilateral 09/02/2018   Sensorineural hearing loss (SNHL) of both ears 07/31/2018   Bruising 11/17/2017   Encounter for other orthopedic aftercare 11/14/2017   Cicatricial lagophthalmos of left lower eyelid  10/31/2017   Closed fracture of head of right radius 10/24/2017   Palatal mass 09/16/2017   Sore throat 09/16/2017   Testicular pain, right 08/01/2017   Combined form of age-related cataract, left eye 05/19/2017   Dry eye syndrome of both eyes 05/19/2017   Primary open angle glaucoma (POAG) of left eye, mild stage 05/19/2017   Radiation retinopathy,  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   Secondary glaucoma due to combination mechanisms, right, severe stage 05/19/2017   Fatigue 01/21/2017   Ankle pain, left 01/21/2017   Fall (on) (from) other stairs and steps, initial encounter 11/11/2016   Abrasion of head 11/11/2016   Ascending aortic aneurysm (HCC) 07/23/2016   Erectile dysfunction 07/19/2016   Bronchiectasis (HCC) 04/23/2016   Chronic bronchitis (HCC) 03/11/2016   Cerumen impaction 08/04/2015   Bilateral impacted cerumen 08/04/2015   Cervical disc disorder with radiculopathy of cervical region 06/06/2014   Elevated WBC count 06/06/2014   Iron deficiency anemia 06/06/2014   Edema 02/21/2014   Tinnitus 02/21/2014   Well adult exam 05/31/2013   Glaucoma 05/31/2013   RML pneumonia 03/31/2013   Hypertension, uncontrolled 02/25/2013   Anxiety disorder 08/08/2010   Chest pain, atypical 02/08/2010   ESOPHAGEAL STRICTURE 11/30/2009   Osteoarthritis 08/07/2009   Malaise and fatigue 05/14/2008   Coronary atherosclerosis 11/04/2007   Lumbago 11/04/2007   THYROIDECTOMY, HX OF 11/04/2007   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    PCP: Genia Kettering, MD  REFERRING PROVIDER: Anastasio Kaska, PA-C  REFERRING DIAG: [phone] (ICD-10-CM) - Right knee pain M25.562 (ICD-10-CM) - Left knee pain R60.0 Bilateral edema of lower extremity  THERAPY DIAG:  Chronic pain of both knees  Other abnormalities of gait and mobility  Muscle weakness (generalized)  Rationale for Evaluation and Treatment: Rehabilitation  ONSET DATE: over a year ago  SUBJECTIVE:   SUBJECTIVE  STATEMENT: 11/27/2023 Pt states his knee bothered him some on Sunday. It just feels sore today. Pt saw his "shoulder doctor" who saw bone on bone.   From initial evaluation:  Pt states he has BIL knee pain (L>R) for over a year at this point, first noticed difficulty with floor transfers and then worsened to the point where the majority of transfers bothered knees to some extent. Particularly bothersome with stairs. He denies any issues with swelling or N/T. He does endorse some chronic ankle swelling bilaterally that he is in communication w/ providers about. We also discuss his complex medical history in depth today, particularly in regard to his back for which he states he will likely be receiving an epidural at some point for. He does mention that he received BIL knee injections ~3 days ago with about 50% relief.   PERTINENT HISTORY: anemia, anxiety, bipolar 1, BPH, basal cell carcinoma, other hx of cancer, CVID, depression, GERD, HTN, PVD, vertigo, thoracic aortic aneurysm, hx thyroidectomy, hx lumbar fusion  PAIN:  Are you having pain: yes Location/description: BIL knees (R>L), anterior knee  NPRS 3/10  - aggravating factors: stair navigation, kneeling, floor transfers, transfers from lower surfaces  - Easing factors: injections, positional changes    PRECAUTIONS: cancer history, aortic aneurysm (no isometrics)  WEIGHT BEARING RESTRICTIONS: No  FALLS:  Has patient fallen in last 6 months? 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 Does have some limitations due to back issues  PATIENT GOALS: would like to be more active  NEXT MD VISIT: TBD, plans to follow with Dr. Lydia Sams beginning of April   OBJECTIVE:  Note: Objective measures  were completed at Evaluation unless otherwise noted.  DIAGNOSTIC FINDINGS:  Recent MRI (09/29/23) for lumbar spine, no recent LE  imaging in chart - pt states he had XR done through [facility], showed arthritis and edema in both knees, states they didn't feel he needed an MRI   PATIENT SURVEYS:  LEFS 38/80  POSTURE: fwd head, rounded shoulders  PALPATION: Concordant tenderness anterior knees BIL, along joint line and patellar tendons. R is more tender to the touch than the L although the L is reportedly more symptomatic   LOWER EXTREMITY ROM:      Right eval Left eval  Hip flexion    Hip extension    Hip internal rotation    Hip external rotation    Knee extension full Full (passively, does have mild limitation actively against gravity)   Knee flexion 130 deg * 110 deg *  (Blank rows = not tested) (Key: WFL = within functional limits not formally assessed, * = concordant pain, s = stiffness/stretching sensation, NT = not tested)  Comments:    LOWER EXTREMITY MMT:    MMT Right eval Left eval  Hip flexion 4 4-  Hip abduction (modified sitting) 4 4  Hip internal rotation    Hip external rotation    Knee flexion 4+ 4  Knee extension 4+ 4  Ankle dorsiflexion 4 4   (Blank rows = not tested) (Key: WFL = within functional limits not formally assessed, * = concordant pain, s = stiffness/stretching sensation, NT = not tested)  Comments:     FUNCTIONAL TESTS:  5xSTS: 12.7sec no UE support, crepitus BIL, L knee pain  GAIT: Distance walked: within clinic Assistive device utilized: None Level of assistance: Complete Independence Comments: widened BOS, reduced truncal rotation, reduced knee ROM BIL throughout all phases of gait but more prominent on L                                                                                                                                 TREATMENT DATE:  [facility] Adult PT Treatment:                                                DATE:  11/27/23 Therapeutic Exercise: Nustep L6 x 5 min LEs Prone quad stretch 2x30" Prone hamstring curl blue TB 2x10 Prone hip abd blue TB x10, no resistance x10 Neuromuscular re-ed: Standing foot tap forward 2x10 Foot on stool and static stance 2x30" Therapeutic Activity: Partial forward lunge on to stool 2x10 Side lunge to stool 2x10 Heel raise 2x10 Runner's step up 4" step 2x10 Gait:    [facility] Adult PT Treatment:                                                DATE: 11/20/23 Therapeutic  Exercise: Nustep L6 x 5 min LEs Quad stretch with strap x30" Gastroc stretch standing x30" Heel raise off step 2x10 Therapeutic Activity: kneel to tall kneel x10, with 10# KB x10 Tall kneel to half kneel x5 Half kneel rock fwd/bwd with glute setting x20 Half kneel to lunge x10 Side step up stairs working on glute activation for his stairs at home 3x3 steps Eccentric step down 2x10    [facility] Adult PT Treatment:                                                DATE: 11/18/23 Therapeutic Exercise: Upright bike, L4 x 6 min for warm up Seated hamstring stretch 2x30" Standing quad stretch with strap 2x30" Standing gastroc stretch x30" Supine SLR x10, with ER x10 Supine bridge x10, with marching x10 Therapeutic Activity: Forward step up on 4" step 2x10 focusing on glute activation Side step up on 4" step 2x10 focusing on glute activation    [facility] Adult PT Treatment:                                                DATE: 11/14/23 Therapeutic Exercise: Nu step L5 LE only during subjective  Seated leg curl machine 10# BIL 2x12 Seated leg extension machine 10# 2x8 BIL  Seated calf ext machine 10# 10 BIL Increased time spent w/ education/discussion re: safety w/ setting up gym equipment, principles of basic progression  Therapeutic Activity: STS 3x5 from standard chair cues for pacing and mechanics  6 inch step weight shift x10 BIL cues for mechanics    [facility] Adult PT Treatment:                                                 DATE: 11/11/23 Therapeutic Exercise: Standing green band TKE 3x10BIL Green band hamstring curl 2x8 Seated hamstring stretch 3x30 sec BIL w/ stool prop  HEP discussion/education  Neuromuscular re-ed: Unresisted LAQ x12 BIL emphasis on quad contraction 4 inch fwd step up 2x8, UE support and cues for quad control   PATIENT EDUCATION:  Education details: rationale for interventions, HEP  Person educated: Patient Education method: Explanation, Demonstration, Tactile cues, Verbal cues Education comprehension: verbalized understanding, returned demonstration, verbal cues required, tactile cues required, and needs further education     HOME EXERCISE PROGRAM: Access Code: [email or web address] URL: [email or web address]  ASSESSMENT:  CLINICAL IMPRESSION: 11/27/2023 Demonstrated to pt how to work on partial lunges at home with stool. Continued work on functional glute strengthening. Working on improving L LE stability/balance with weight shifting and knee bending.    From Initial evaluation: Patient is a pleasant 68 y.o. gentleman who was seen today for physical therapy evaluation and treatment for bilateral knee pain ongoing over past year, much improved with recent injections. Primarily limited in closed chain activities involving knee flexion such as transfers and stair navigation. On exam he demonstrates concordant limitations in knee mobility and strength, L more affected than R. His 5xSTS time is right at cutoff score for fall risk in majority of populations (12-14sec, varying) and elicits mild knee pain. No adverse events,  tolerates exam/HEP well without increase in resting pain. Recommend trial of skilled PT to address aforementioned deficits with aim of improving functional tolerance and reducing pain with typical activities. Pt departs today's session in no acute distress, all voiced concerns/questions addressed appropriately from PT perspective.       OBJECTIVE  IMPAIRMENTS: Abnormal gait, decreased activity tolerance, decreased endurance, decreased mobility, difficulty walking, decreased ROM, decreased strength, postural dysfunction, and pain.   ACTIVITY LIMITATIONS: standing, squatting, stairs, transfers, and locomotion level  PARTICIPATION LIMITATIONS: meal prep, cleaning, laundry, and community activity  PERSONAL FACTORS: Age, Time since onset of injury/illness/exacerbation, and 3+ comorbidities: anemia, anxiety, bipolar 1, BPH, basal cell carcinoma, other hx of cancer, CVID, depression, GERD, HTN, PVD, vertigo, thoracic aortic aneurysm, hx thyroidectomy, hx lumbar fusion  are also affecting patient's functional outcome.   REHAB POTENTIAL: Fair given chronicity and comorbidities  CLINICAL DECISION MAKING: Evolving/moderate complexity  EVALUATION COMPLEXITY: Moderate   GOALS:   SHORT TERM GOALS: Target date: 11/14/2023  Pt will demonstrate appropriate understanding and performance of initially prescribed HEP in order to facilitate improved independence with management of symptoms.  Baseline: HEP established  11/14/23: reports good HEP adherence  Goal status: MET  2. Pt will report at least 25% improvement in overall pain levels over past week in order to facilitate improved tolerance to typical daily activities.   Baseline: 0-8/10 Goal status: ONGOING  LONG TERM GOALS: Target date: 12/12/2023  Pt will score 50/80 or greater on LEFS in order to demonstrate improved perception of function due to symptoms (MCID 9 pts) Baseline: 38/80 Goal status: INITIAL  2.  Pt will demonstrate at least 120 degrees of knee flexion AROM bilaterally in order to facilitate improved tolerance to gait/transfers.  Baseline: see ROM chart above Goal status: INITIAL  3.  Pt will report at least 50% decrease in overall pain levels in past week in order to facilitate improved tolerance to basic ADLs/mobility.   Baseline: 0-8/10  Goal status: INITIAL    4.  Pt  will be able to perform 5xSTS in less than or equal to 10 in order to demonstrate reduced fall risk and improved functional independence (MCID 5xSTS = 2.3 sec). Baseline: 12sec no UE support Goal status: INITIAL   5. Pt will report/demonstrate ability to navigate at least 13 steps w/o UE support and less than 3 pt increase in pain in order to facilitate improved tolerance to home navigation  Baseline: pain with stair navigation  Goal status: INITIAL   PLAN:  PT FREQUENCY: 1-2x/week  PT DURATION: 8 weeks  PLANNED INTERVENTIONS: [id number]- PT Re-evaluation, 97110-Therapeutic exercises, 97530- Therapeutic activity, 97112- Neuromuscular re-education, 97535- Self Care, 82956- Manual therapy, [id number]- Gait training, [phone]- Aquatic Therapy, Patient/Family education, Balance training, Stair training, Taping, Dry Needling, Joint mobilization, Cryotherapy, and Moist heat  PLAN FOR NEXT SESSION: Review/update HEP PRN. Work on [employer] exercises as appropriate with emphasis on quad activation, knee flexion mobility (especially on L), closed chain stability and functional mechanics. Symptom modification strategies as indicated/appropriate. Mindful of complex medical history including aortic aneurysm, cancer. Interested in using gym equipment  [facility] April Dell Ponto, Caro, DPT 11/27/2023 1:00 PM    [facility] [address] [place], [name], [address] Phone: [phone]   Fax:  [phone]

## 2023-11-30 ENCOUNTER — Encounter (HOSPITAL_COMMUNITY): Payer: Self-pay | Admitting: Licensed Clinical Social Worker

## 2023-11-30 NOTE — Progress Notes (Unsigned)
 Virtual virtual Video Note  I connected with Alex [name] on 11/19/2023 at 2-3pm EST by video-enabled virtual visit. I verified that I am speaking with the correct person using  two identifiers.I discussed the limitations of evaluation and management by telemedicine and the availability of in person appointments. The patient expressed understanding and agreed to proceed.    LOCATION: Patient: Home  Provider: Home office   History of Present Illness:  Pt was referred by Dr. Arfeen for OP therapy for bipolar disorder and anxiety.  Treatment Goal Addressed:  Pt will meet with clinician weekly for therapy to monitor for progress towards goals and address any barriers to success; Reduce depression from average severity level of 6/10 down to a 4/10 in next 6 months by engaging in 1-2 positive coping skills daily as part of developing self-care routine; Reduce average anxiety level from 7/10 down to 5/10 in next 6 months by utilizing 1-2 relaxation skills/grounding skills per day, such as mindful breathing, progressive muscle relaxation, positive visualizations.  Progress towards Treatment Goal: Progressing   Observations/Objective: Patient presented for today's session on time and was alert, oriented x5, with no evidence or self-report of SI/HI or A/V H.  Patient reported ongoing compliance with medication and denied any use of alcohol  or illicit substances.  Clinician inquired about patient's current emotional ratings, as well as any significant changes in thoughts, feelings or behavior since previous session. Patient reported scores of  8/10 for depression, 8/10 for anxiety, 3/10 for anger/irritability. [name] and pt explored his emotional ratings, and discussed coping skills for stressors. 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 Pt discussed his current health issues and his fear of the future due to his health.  Clinician utilized CBT to address thought processes support and confidence in his future plans.      Assessment and plan: Counselor will continue to meet with patient to address treatment plan goals. Patient will continue to follow recommendations of providers and imnd implement skills learned in session, and practice between sessions. Diagnosis: Bipolar 1 disorder.    Collaboration of care: Other:   Continue working with providers    Patient/Guardian was advised Release of Information must be obtained prior to any record release in order to collaborate their care with an outside provider. Patient/Guardian was advised if they have not already done so to contact the registration department to sign all necessary forms in order for us  to release information regarding their care.    Consent: Patient/Guardian gives verbal consent for treatment and assignment of benefits for services provided during this visit. Patient/Guardian expressed understanding and agreed to proceed.     Follow Up Instructions:  I discussed the assessment and treatment plan with the patient. The patient was provided an opportunity to ask questions and all were answered. The patient agreed with the plan and demonstrated an understanding of the instructions.   The patient was advised to call back or seek an in-person evaluation if the symptoms worsen or if the condition fails to improve as anticipated.  I provided 60 minutes of non-face-to-face time during this encounter.   Alex [name] S, LCAS  11/26/23

## 2023-12-01 NOTE — Telephone Encounter (Signed)
 Cyann from [facility] are requesting a call back to discuss the referral.  Please call: [phone]

## 2023-12-02 ENCOUNTER — Ambulatory Visit (HOSPITAL_BASED_OUTPATIENT_CLINIC_OR_DEPARTMENT_OTHER): Admitting: Physical Therapy

## 2023-12-02 DIAGNOSIS — M6281 Muscle weakness (generalized): Secondary | ICD-10-CM

## 2023-12-02 DIAGNOSIS — G8929 Other chronic pain: Secondary | ICD-10-CM

## 2023-12-02 DIAGNOSIS — R2689 Other abnormalities of gait and mobility: Secondary | ICD-10-CM

## 2023-12-02 DIAGNOSIS — M25561 Pain in right knee: Secondary | ICD-10-CM | POA: Diagnosis not present

## 2023-12-02 DIAGNOSIS — M25562 Pain in left knee: Secondary | ICD-10-CM | POA: Diagnosis not present

## 2023-12-02 NOTE — Therapy (Signed)
 OUTPATIENT PHYSICAL THERAPY LOWER EXTREMITY TREATMENT   Patient Name: Alex [name] MRN: [id number] DOB:07/05/1956, 68 y.o., male Today's Date: 12/02/2023  END OF SESSION:  PT End of Session - 12/02/23 1253     Visit Number 9    Number of Visits 17    Date for PT Re-Evaluation 12/12/23    Authorization Type BCBS    PT Start Time 1300    PT Stop Time 1340    PT Time Calculation (min) 40 min    Activity Tolerance Patient tolerated treatment well              Past Medical History:  Diagnosis Date   Anemia    Anxiety    Atypical nevus 04/12/1997   dyplastic-left chest below nipple   Atypical nevus 01/18/2005   slight-mod-mid upper abd, slight-mod-right lateral chest-(WS), slight-mod-mid lower back (punch)   Atypical nevus 05/31/2005   dysplastic-central lowerback (exc), dysplastic- right abdomen (Exc)   Basal cell carcinoma 06/04/2016   back of neck   Bipolar I disorder (HCC)    Bleeding ulcer 2016   BPH (benign prostatic hypertrophy) with urinary obstruction    Cancer (HCC)    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 Cheril Cork, oncology. Receives monthly IVIG 40 grams.   Depression    Epicondylitis    right elbow   GERD (gastroesophageal reflux disease)    Glaucoma BOTH EYES   RIGHT EYE RADIATION DAMAGE   Hearing loss    Bilateral   Hepatic cyst    Several, The lesion of concern in segment 6 of the liver has single large portal vein and hepatic vein branches  extending to tt, in a pattern of enhancement which mirrors these vascular structures. 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 9 CM X 4. 3 CM PER NOV 06-14-17  CHEST CTFOLOWED BY DR CRENSHAW YEARLY FOR   Positional vertigo    HX OF WITH SINUS INFECTIONS  Prostate cancer [facility])    Follows w/ Dr. Andy Bannister Polascik @ [facility].   Radial head fracture    Right   Squamous cell carcinoma of skin 06/04/2016   in situ-crown of scalp   Squamous cell carcinoma of skin 04/22/2017   in situ-crown scalp (txpbx)   Thoracic aortic aneurysm (HCC) 06/14/2017   last CT 4.1 CM Mild   Tinnitus    CONSTANT   Ulnar nerve compression    right elbow   Unsteady gait    especially with stairs, depth perception off   Urinary hesitancy    Wears glasses    Past Surgical History:  Procedure Laterality Date   CARDIAC CATHETERIZATION  01-16-2006  DR Andy Bannister WALL   MILD CORONARY ATHEROSCLEROSIS/ MID TO DISTAL LAD 40% STENOSIS/ LVF 50-55%   CARPAL TUNNEL RELEASE Right 11/03/2017   Procedure: RIGHT HAND CARPAL TUNNEL RELEASE;  Surgeon: Arvil Birks, MD;  Location: [facility];   Service: Orthopedics;  Laterality: Right;   CATARACT EXTRACTION Right    COLONSCOPY  2017 LAST DONE   MULTIPLE   CYST EXCISION Right 09/04/2023   Procedure: EXCISION CYST RIGHT SHOULDER;  Surgeon: Oralee Billow, MD;  Location: [facility];  Service: General;  Laterality: Right;  LOCAL & MAC   ENDOSCOPY  LAST 2017   MULTIPLE DONE DILATION DONE ALSO   ESOPHAGOGASTRODUODENOSCOPY (EGD) WITH PROPOFOL  N/A 02/24/2018   Procedure: ESOPHAGOGASTRODUODENOSCOPY (EGD) WITH PROPOFOL ;  Surgeon: Jolinda Necessary, MD;  Location: [facility];  Service: Endoscopy;  Laterality: N/A;   EXCISION RADIAL HEAD Right 11/03/2017   Procedure: RIGHT PROXIMAL RADIUS RADIAL HEAD RESECTION AND JOINT DEBRIDEMENT;  Surgeon: Arvil Birks, MD;  Location: [facility];  Service: Orthopedics;  Laterality: Right;   EXTRACORPOREAL SHOCK WAVE LITHOTRIPSY Right 11/20/2020   Procedure: EXTRACORPOREAL SHOCK WAVE LITHOTRIPSY (ESWL);  Surgeon: Osborn Blaze, MD;  Location: [facility];  Service: Urology;  Laterality: Right;  75 MINS   KNEE ARTHROSCOPY  05/01/2012   Procedure: ARTHROSCOPY KNEE;  Surgeon: Loel Ring, MD;  Location: [facility];  Service: Orthopedics;  Laterality: Left;  debridement and removal of loose body   LEFT ANKLE ARTHROSCOPY W/ DEBRIDEMENT  05/12/2007   LEFT HYDROCELECTOMY  03/29/2005   AND REPAIR LEFT INGUINAL HERNIA W/ MESH   MOHS SURGERY  2002   RIGHT ORBITAL CANCER   NASAL ENDOSCOPY  08/07/2005   RIGHT EPISTAXIS  / POST SEPTOPLASTY  (HX RIGHT ORBITAL CA & S/P RADIATION/ NECROSIS ANTERIOR END OF BOTH INFERIOR TURBINATES)   occuloplastic surgery  2002   PARS PLANA VITRECTOMY  11/06/2004   RIGHT EYE RADIATION RETINOPATHY W/ HEMORRHAGE   PROSTATE ABLATION N/A 06/2022   RADIAL HEAD ARTHROPLASTY Right 06/15/2018   Procedure: RIGHT ELBOW PROXIMAL RADIOULNAR JOINT DEBRIDEMENT AND ARTHROPLASTY;  Surgeon: Arvil Birks, MD;  Location: [facility];  Service: Orthopedics;  Laterality: Right;  BLOCK WITH SEDATION   REPAIR UNDESENDED RIGHT TESTICLE / RIGHT INGUINAL HERNIA  AGE 88   RIGHT ANKLE ARTHROSCOPY W/ EXTENSIVE DEBRIDEMENT  04/05/2008   x2   RIGHT SHOULDER SURGERY  2006   RIGHT SUPRAOMOHYOID NECK DISSECTION   03/08/2003   ZONES 1,2,3;   SUBMANDIBULAR MASS / METASTATIC SQUAMOUS CELL CARCINOMA RIGHT NECK   SAVORY DILATION N/A 02/24/2018   Procedure: SAVORY DILATION;  Surgeon: Jolinda Necessary, MD;  Location: [facility];  Service: Endoscopy;  Laterality: N/A;   SEPTOPLASTY  06/2005   SHOULDER ARTHROSCOPY Left    SHOULDER ARTHROSCOPY  W/ SUBACROMIAL DECOMPRESSION AND DISTAL CLAVICLE EXCISION  10/09/2008   AND DEBRIDEMENT OF RIGHT SHOULDER IMPINGEMENT & AC JOINT ARTHRITIS   SPINE SURGERY  2016   l 3 TO l 4 PLATE AND SCREWS   TOTAL THYROIDECTOMY  11/03/2001   PAPILLARY THYROID  CARCINOMA   TRANSTHORACIC ECHOCARDIOGRAM  07/2011   grade I diastolic dysfunction/ ef 55-60%   ULNAR NERVE TRANSPOSITION Right 04/28/2014   Procedure: RIGHT ELBOW ULNA NERVE RELEASE TRANSPOSTION AND MEDIAL EPICONDYLAR DEBRIDEMENT AND REPAIR;  Surgeon: Shellie Dials, MD;  Location: [facility];  Service: Orthopedics;  Laterality: Right;   Patient Active Problem List   Diagnosis Date Noted   Primary osteoarthritis of both knees 09/22/2023   Boil 08/26/2023   Abscess 08/20/2023   Cellulitis 05/12/2023   Bruise of lower lip 04/17/2023   Wheezing 03/31/2023   Injury of medial collateral ligament (MCL) of knee 02/07/2023   Sprain of medial collateral ligament of right knee 02/07/2023   Osteoarthritis of subtalar joint 09/25/2022   Cystoid macular edema of right eye 07/18/2022   Hyperglycemia 07/18/2022   Nonexudative age-related macular degeneration, left eye, early dry stage 07/18/2022   Retinal edema 07/18/2022   Superficial punctate keratitis of right eye 07/18/2022   Grief at loss of child 05/22/2022   Actinic keratoses 05/22/2022    Pain in joint of left elbow 04/04/2022   Abnormal defecation 02/26/2022   Atrophic gastritis 02/26/2022   Bipolar 1 disorder (HCC) 02/26/2022   BPH (benign prostatic hyperplasia) 02/26/2022   Bronchiolectasis (HCC) 02/26/2022   Disease of thyroid  gland 02/26/2022   Diverticular disease of colon 02/26/2022   Family history of malignant neoplasm of digestive organs 02/26/2022   Hyperlipidemia 02/26/2022   IgG deficiency (HCC) 02/26/2022   Immune deficiency disorder (HCC) 02/26/2022   Irritable bowel syndrome 02/26/2022   Pancreatic cyst 02/26/2022   History of colonic polyps 02/26/2022   Portosystemic shunt, 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/26/2022   Pyloric ulcer 02/26/2022   Scoliosis 02/26/2022   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   Spinal stenosis of lumbar region 02/26/2022   Prostate cancer (HCC) 02/26/2022   Dysuria 10/03/2021   Earache 06/14/2021   Anemia 06/14/2021   History of head and neck cancer 03/28/2021   Swelling of right parotid gland 03/28/2021   Epistaxis, recurrent 06/26/2020   PMR (polymyalgia rheumatica) (HCC) 02/22/2020   Diverticulitis 01/25/2020   Abdominal pain 01/25/2020   Iliac vessel injury 11/30/2019   Stress at home 09/14/2019   LLQ abdominal pain 09/06/2019   IPMN (intraductal papillary mucinous neoplasm) 07/19/2019   History of thyroid  cancer 06/03/2019   Abnormal findings on diagnostic imaging of liver 06/03/2019   Epididymitis 03/16/2019   Difficulty urinating 03/16/2019   Pain in right foot 03/12/2019   Hypothyroidism (acquired) 09/29/2018   Moderate persistent asthma with acute exacerbation 09/29/2018   Prediabetes 09/29/2018   RTI (respiratory tract infection) 09/29/2018   ETD (Eustachian tube dysfunction), bilateral 09/02/2018   Sensorineural hearing loss (SNHL) of both ears 07/31/2018   Bruising 11/17/2017   Encounter for other orthopedic aftercare 11/14/2017   Cicatricial lagophthalmos of left lower eyelid  10/31/2017   Closed fracture of head of right radius 10/24/2017   Palatal mass 09/16/2017   Sore throat 09/16/2017   Testicular pain, right 08/01/2017   Combined form of age-related cataract, left eye 05/19/2017   Dry eye syndrome of both eyes 05/19/2017   Primary open angle glaucoma (POAG) of left eye, mild stage 05/19/2017   Radiation retinopathy,  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   Secondary glaucoma due to combination mechanisms, right, severe stage 05/19/2017   Fatigue 01/21/2017   Ankle pain, left 01/21/2017   Fall (on) (from) other stairs and steps, initial encounter 11/11/2016   Abrasion of head 11/11/2016   Ascending aortic aneurysm (HCC) 07/23/2016   Erectile dysfunction 07/19/2016   Bronchiectasis (HCC) 04/23/2016   Chronic bronchitis (HCC) 03/11/2016   Cerumen impaction 08/04/2015   Bilateral impacted cerumen 08/04/2015   Cervical disc disorder with radiculopathy of cervical region 06/06/2014   Elevated WBC count 06/06/2014   Iron  deficiency anemia 06/06/2014   Edema 02/21/2014   Tinnitus 02/21/2014   Well adult exam 05/31/2013   Glaucoma 05/31/2013   RML pneumonia 03/31/2013   Hypertension, uncontrolled 02/25/2013   Anxiety disorder 08/08/2010   Chest pain, atypical 02/08/2010   ESOPHAGEAL STRICTURE 11/30/2009   Osteoarthritis 08/07/2009   Malaise and fatigue 05/14/2008   Coronary atherosclerosis 11/04/2007   Lumbago 11/04/2007   THYROIDECTOMY, HX OF 11/04/2007   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    PCP: Genia Kettering, MD  REFERRING PROVIDER: Anastasio Kaska, PA-C  REFERRING DIAG: [id number] (ICD-10-CM) - Right knee pain M25.562 (ICD-10-CM) - Left knee pain R60.0 Bilateral edema of lower extremity  THERAPY DIAG:  Chronic pain of both knees  Other abnormalities of gait and mobility  Muscle weakness (generalized)  Rationale for Evaluation and Treatment: Rehabilitation  ONSET DATE: over a year ago  SUBJECTIVE:   SUBJECTIVE  STATEMENT: 12/02/2023 Pt states he has some sore ankles from new shoes. Shoes not yet broken in. Reports less back pain at night.   From initial evaluation:  Pt states he has BIL knee pain (L>R) for over a year at this point, first noticed difficulty with floor transfers and then worsened to the point where the majority of transfers bothered knees to some extent. Particularly bothersome with stairs. He denies any issues with swelling or N/T. He does endorse some chronic ankle swelling bilaterally that he is in communication w/ providers about. We also discuss his complex medical history in depth today, particularly in regard to his back for which he states he will likely be receiving an epidural at some point for. He does mention that he received BIL knee injections ~3 days ago with about 50% relief.   PERTINENT HISTORY: anemia, anxiety, bipolar 1, BPH, basal cell carcinoma, other hx of cancer, CVID, depression, GERD, HTN, PVD, vertigo, thoracic aortic aneurysm, hx thyroidectomy, hx lumbar fusion  PAIN:  Are you having pain: no Location/description: BIL knees (R>L), anterior knee  NPRS 0/10  - aggravating factors: stair navigation, kneeling, floor transfers, transfers from lower surfaces  - Easing factors: injections, positional changes    PRECAUTIONS: cancer history, aortic aneurysm (no isometrics)  WEIGHT BEARING RESTRICTIONS: No  FALLS:  Has patient fallen in last 6 months? 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 Does have some limitations due to back issues  PATIENT GOALS: would like to be more active  NEXT MD VISIT: TBD, plans to follow with Dr. Lydia Sams beginning of April   OBJECTIVE:  Note: Objective measures were completed at  Evaluation unless otherwise noted.  DIAGNOSTIC FINDINGS:  Recent MRI (09/29/23) for lumbar spine, no recent LE imaging in chart -  pt states he had XR done through [facility], showed arthritis and edema in both knees, states they didn't feel he needed an MRI   PATIENT SURVEYS:  LEFS 38/80  POSTURE: fwd head, rounded shoulders  PALPATION: Concordant tenderness anterior knees BIL, along joint line and patellar tendons. R is more tender to the touch than the L although the L is reportedly more symptomatic   LOWER EXTREMITY ROM:      Right eval Left eval  Hip flexion    Hip extension    Hip internal rotation    Hip external rotation    Knee extension full Full (passively, does have mild limitation actively against gravity)   Knee flexion 130 deg * 110 deg *  (Blank rows = not tested) (Key: WFL = within functional limits not formally assessed, * = concordant pain, s = stiffness/stretching sensation, NT = not tested)  Comments:    LOWER EXTREMITY MMT:    MMT Right eval Left eval  Hip flexion 4 4-  Hip abduction (modified sitting) 4 4  Hip internal rotation    Hip external rotation    Knee flexion 4+ 4  Knee extension 4+ 4  Ankle dorsiflexion 4 4   (Blank rows = not tested) (Key: WFL = within functional limits not formally assessed, * = concordant pain, s = stiffness/stretching sensation, NT = not tested)  Comments:     FUNCTIONAL TESTS:  5xSTS: 12.7sec no UE support, crepitus BIL, L knee pain  GAIT: Distance walked: within clinic Assistive device utilized: None Level of assistance: Complete Independence Comments: widened BOS, reduced truncal rotation, reduced knee ROM BIL throughout all phases of gait but more prominent on L                                                                                                                                 TREATMENT DATE:  [facility] Adult PT Treatment:                                                DATE: 12/02/23 Therapeutic  Exercise: UBE L1; 3 min fwd, 3 min bwd Therapeutic activity: Review of gym equipment pt can perform outside of PT (how to adjust and how to set up) Leg press 100# double leg 2x10 Leg press 70# single leg 2x10 Hip abductor machine 70# 2x10 Cables 10# donkey kick 2x10 Cables 5-10# hip abd 2x10 Hip adduction machine 85# 2x10   [facility] Adult PT Treatment:                                                DATE: 11/27/23 Therapeutic Exercise: Nustep L6 x 5 min LEs Prone  quad stretch 2x30" Prone hamstring curl blue TB 2x10 Prone hip abd blue TB x10, no resistance x10 Neuromuscular re-ed: Standing foot tap forward 2x10 Foot on stool and static stance 2x30" Therapeutic Activity: Partial forward lunge on to stool 2x10 Side lunge to stool 2x10 Heel raise 2x10 Runner's step up 4" step 2x10    [facility] Adult PT Treatment:                                                DATE: 11/20/23 Therapeutic Exercise: Nustep L6 x 5 min LEs Quad stretch with strap x30" Gastroc stretch standing x30" Heel raise off step 2x10 Therapeutic Activity: kneel to tall kneel x10, with 10# KB x10 Tall kneel to half kneel x5 Half kneel rock fwd/bwd with glute setting x20 Half kneel to lunge x10 Side step up stairs working on glute activation for his stairs at home 3x3 steps Eccentric step down 2x10    [facility] Adult PT Treatment:                                                DATE: 11/18/23 Therapeutic Exercise: Upright bike, L4 x 6 min for warm up Seated hamstring stretch 2x30" Standing quad stretch with strap 2x30" Standing gastroc stretch x30" Supine SLR x10, with ER x10 Supine bridge x10, with marching x10 Therapeutic Activity: Forward step up on 4" step 2x10 focusing on glute activation Side step up on 4" step 2x10 focusing on glute activation    [facility] Adult PT Treatment:                                                DATE: 11/14/23 Therapeutic Exercise: Nu step L5 LE only during subjective  Seated leg curl  machine 10# BIL 2x12 Seated leg extension machine 10# 2x8 BIL  Seated calf ext machine 10# 10 BIL Increased time spent w/ education/discussion re: safety w/ setting up gym equipment, principles of basic progression  Therapeutic Activity: STS 3x5 from standard chair cues for pacing and mechanics  6 inch step weight shift x10 BIL cues for mechanics    [facility] Adult PT Treatment:                                                DATE: 11/11/23 Therapeutic Exercise: Standing green band TKE 3x10BIL Green band hamstring curl 2x8 Seated hamstring stretch 3x30 sec BIL w/ stool prop  HEP discussion/education  Neuromuscular re-ed: Unresisted LAQ x12 BIL emphasis on quad contraction 4 inch fwd step up 2x8, UE support and cues for quad control   PATIENT EDUCATION:  Education details: rationale for interventions, HEP  Person educated: Patient Education method: Explanation, Demonstration, Tactile cues, Verbal cues Education comprehension: verbalized understanding, returned demonstration, verbal cues required, tactile cues required, and needs further education     HOME EXERCISE PROGRAM: Access Code: [email or web address] URL: [email or web address]  ASSESSMENT:  CLINICAL IMPRESSION: 12/02/2023 Worked primarily in the gym today to demo how to strengthen  his glutes and hips using equipment. Pt able to demonstrate good understanding but may benefit from review on set up.    From Initial evaluation: Patient is a pleasant 68 y.o. gentleman who was seen today for physical therapy evaluation and treatment for bilateral knee pain ongoing over past year, much improved with recent injections. Primarily limited in closed chain activities involving knee flexion such as transfers and stair navigation. On exam he demonstrates concordant limitations in knee mobility and strength, L more affected than R. His 5xSTS time is right at cutoff score for fall risk in majority of populations (12-14sec, varying) and elicits  mild knee pain. No adverse events, tolerates exam/HEP well without increase in resting pain. Recommend trial of skilled PT to address aforementioned deficits with aim of improving functional tolerance and reducing pain with typical activities. Pt departs today's session in no acute distress, all voiced concerns/questions addressed appropriately from PT perspective.       OBJECTIVE IMPAIRMENTS: Abnormal gait, decreased activity tolerance, decreased endurance, decreased mobility, difficulty walking, decreased ROM, decreased strength, postural dysfunction, and pain.   ACTIVITY LIMITATIONS: standing, squatting, stairs, transfers, and locomotion level  PARTICIPATION LIMITATIONS: meal prep, cleaning, laundry, and community activity  PERSONAL FACTORS: Age, Time since onset of injury/illness/exacerbation, and 3+ comorbidities: anemia, anxiety, bipolar 1, BPH, basal cell carcinoma, other hx of cancer, CVID, depression, GERD, HTN, PVD, vertigo, thoracic aortic aneurysm, hx thyroidectomy, hx lumbar fusion  are also affecting patient's functional outcome.   REHAB POTENTIAL: Fair given chronicity and comorbidities  CLINICAL DECISION MAKING: Evolving/moderate complexity  EVALUATION COMPLEXITY: Moderate   GOALS:   SHORT TERM GOALS: Target date: 11/14/2023  Pt will demonstrate appropriate understanding and performance of initially prescribed HEP in order to facilitate improved independence with management of symptoms.  Baseline: HEP established  11/14/23: reports good HEP adherence  Goal status: MET  2. Pt will report at least 25% improvement in overall pain levels over past week in order to facilitate improved tolerance to typical daily activities.   Baseline: 0-8/10 Goal status: ONGOING  LONG TERM GOALS: Target date: 12/12/2023  Pt will score 50/80 or greater on LEFS in order to demonstrate improved perception of function due to symptoms (MCID 9 pts) Baseline: 38/80 Goal status: INITIAL  2.  Pt will  demonstrate at least 120 degrees of knee flexion AROM bilaterally in order to facilitate improved tolerance to gait/transfers.  Baseline: see ROM chart above Goal status: INITIAL  3.  Pt will report at least 50% decrease in overall pain levels in past week in order to facilitate improved tolerance to basic ADLs/mobility.   Baseline: 0-8/10  Goal status: INITIAL    4.  Pt will be able to perform 5xSTS in less than or equal to 10 in order to demonstrate reduced fall risk and improved functional independence (MCID 5xSTS = 2.3 sec). Baseline: 12sec no UE support Goal status: INITIAL   5. Pt will report/demonstrate ability to navigate at least 13 steps w/o UE support and less than 3 pt increase in pain in order to facilitate improved tolerance to home navigation  Baseline: pain with stair navigation  Goal status: INITIAL   PLAN:  PT FREQUENCY: 1-2x/week  PT DURATION: 8 weeks  PLANNED INTERVENTIONS: [id number]- PT Re-evaluation, 97110-Therapeutic exercises, 97530- Therapeutic activity, 97112- Neuromuscular re-education, 97535- Self Care, [id number]- Manual therapy, [id number]- Gait training, [id number]- Aquatic Therapy, Patient/Family education, Balance training, Stair training, Taping, Dry Needling, Joint mobilization, Cryotherapy, and Moist heat  PLAN FOR NEXT SESSION: Review/update HEP PRN. Work  on ROM/strength exercises as appropriate with emphasis on quad activation, knee flexion mobility (especially on L), closed chain stability and functional mechanics. Symptom modification strategies as indicated/appropriate. Mindful of complex medical history including aortic aneurysm, cancer. Interested in using gym equipment  Okie Jansson April Ma L Sriram Febles, [place], DPT 12/02/2023 12:53 PM    [facility] [address] [place], [name], [address] Phone: [phone]   Fax:  [phone]

## 2023-12-03 ENCOUNTER — Ambulatory Visit (INDEPENDENT_AMBULATORY_CARE_PROVIDER_SITE_OTHER): Admitting: Licensed Clinical Social Worker

## 2023-12-03 DIAGNOSIS — F319 Bipolar disorder, unspecified: Secondary | ICD-10-CM

## 2023-12-04 ENCOUNTER — Encounter: Payer: Self-pay | Admitting: Radiology

## 2023-12-04 ENCOUNTER — Ambulatory Visit (HOSPITAL_BASED_OUTPATIENT_CLINIC_OR_DEPARTMENT_OTHER): Admitting: Physical Therapy

## 2023-12-04 DIAGNOSIS — M25562 Pain in left knee: Secondary | ICD-10-CM | POA: Diagnosis not present

## 2023-12-04 DIAGNOSIS — M25561 Pain in right knee: Secondary | ICD-10-CM | POA: Diagnosis not present

## 2023-12-04 DIAGNOSIS — M6281 Muscle weakness (generalized): Secondary | ICD-10-CM

## 2023-12-04 DIAGNOSIS — R2689 Other abnormalities of gait and mobility: Secondary | ICD-10-CM | POA: Diagnosis not present

## 2023-12-04 DIAGNOSIS — G8929 Other chronic pain: Secondary | ICD-10-CM

## 2023-12-04 NOTE — Therapy (Signed)
 OUTPATIENT PHYSICAL THERAPY LOWER EXTREMITY TREATMENT   Patient Name: Alex [name] MRN: [id number] DOB:[birth year], 68 y.o., male Today's Date: 12/04/2023  END OF SESSION:  PT End of Session - 12/04/23 1257     Visit Number 10    Number of Visits 17    Date for PT Re-Evaluation 12/12/23    Authorization Type BCBS    PT Start Time 1305    PT Stop Time 1345    PT Time Calculation (min) 40 min    Activity Tolerance Patient tolerated treatment well              Past Medical History:  Diagnosis Date   Anemia    Anxiety    Atypical nevus 04/12/1997   dyplastic-left chest below nipple   Atypical nevus 01/18/2005   slight-mod-mid upper abd, slight-mod-right lateral chest-(WS), slight-mod-mid lower back (punch)   Atypical nevus 05/31/2005   dysplastic-central lowerback (exc), dysplastic- right abdomen (Exc)   Basal cell carcinoma 06/04/2016   back of neck   Bipolar I disorder (HCC)    Bleeding ulcer 2016   BPH (benign prostatic hypertrophy) with urinary obstruction    Cancer (HCC)    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 Cheril Cork, oncology. Receives monthly IVIG 40 grams.   Depression    Epicondylitis    right elbow   GERD (gastroesophageal reflux disease)    Glaucoma BOTH EYES   RIGHT EYE RADIATION DAMAGE   Hearing loss    Bilateral   Hepatic cyst    Several, The lesion of concern in segment 6 of the liver has single large portal vein and hepatic vein branches  extending to tt, in a pattern of enhancement which mirrors these vascular structures. 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 9 CM X 4. 3 CM PER NOV 06-14-17  CHEST CTFOLOWED BY DR CRENSHAW YEARLY FOR   Positional vertigo    HX OF WITH SINUS INFECTIONS  Prostate cancer [facility])    Follows w/ Dr. Andy Bannister Polascik @ [facility].   Radial head fracture    Right   Squamous cell carcinoma of skin 06/04/2016   in situ-crown of scalp   Squamous cell carcinoma of skin 04/22/2017   in situ-crown scalp (txpbx)   Thoracic aortic aneurysm (HCC) 06/14/2017   last CT 4.1 CM Mild   Tinnitus    CONSTANT   Ulnar nerve compression    right elbow   Unsteady gait    especially with stairs, depth perception off   Urinary hesitancy    Wears glasses    Past Surgical History:  Procedure Laterality Date   CARDIAC CATHETERIZATION  01-16-2006  DR Andy Bannister WALL   MILD CORONARY ATHEROSCLEROSIS/ MID TO DISTAL LAD 40% STENOSIS/ LVF 50-55%   CARPAL TUNNEL RELEASE Right 11/03/2017   Procedure: RIGHT HAND CARPAL TUNNEL RELEASE;  Surgeon: Arvil Birks, MD;  Location: [facility];   Service: Orthopedics;  Laterality: Right;   CATARACT EXTRACTION Right    COLONSCOPY  2017 LAST DONE   MULTIPLE   CYST EXCISION Right 09/04/2023   Procedure: EXCISION CYST RIGHT SHOULDER;  Surgeon: Oralee Billow, MD;  Location: [facility];  Service: General;  Laterality: Right;  LOCAL & MAC   ENDOSCOPY  LAST 2017   MULTIPLE DONE DILATION DONE ALSO   ESOPHAGOGASTRODUODENOSCOPY (EGD) WITH PROPOFOL  N/A 02/24/2018   Procedure: ESOPHAGOGASTRODUODENOSCOPY (EGD) WITH PROPOFOL ;  Surgeon: Jolinda Necessary, MD;  Location: [facility];  Service: Endoscopy;  Laterality: N/A;   EXCISION RADIAL HEAD Right 11/03/2017   Procedure: RIGHT PROXIMAL RADIUS RADIAL HEAD RESECTION AND JOINT DEBRIDEMENT;  Surgeon: Arvil Birks, MD;  Location: [facility] ;  Service: Orthopedics;  Laterality: Right;   EXTRACORPOREAL SHOCK WAVE LITHOTRIPSY Right 11/20/2020   Procedure: EXTRACORPOREAL SHOCK WAVE LITHOTRIPSY (ESWL);  Surgeon: Osborn Blaze, MD;  Location: [facility];  Service: Urology;  Laterality: Right;  75 MINS   KNEE ARTHROSCOPY  05/01/2012   Procedure: ARTHROSCOPY KNEE;  Surgeon: Loel Ring, MD;  Location: [facility];  Service: Orthopedics;  Laterality: Left;  debridement and removal of loose body   LEFT ANKLE ARTHROSCOPY W/ DEBRIDEMENT  05/12/2007   LEFT HYDROCELECTOMY  03/29/2005   AND REPAIR LEFT INGUINAL HERNIA W/ MESH   MOHS SURGERY  2002   RIGHT ORBITAL CANCER   NASAL ENDOSCOPY  08/07/2005   RIGHT EPISTAXIS  / POST SEPTOPLASTY  (HX RIGHT ORBITAL CA & S/P RADIATION/ NECROSIS ANTERIOR END OF BOTH INFERIOR TURBINATES)   occuloplastic surgery  2002   PARS PLANA VITRECTOMY  11/06/2004   RIGHT EYE RADIATION RETINOPATHY W/ HEMORRHAGE   PROSTATE ABLATION N/A 06/2022   RADIAL HEAD ARTHROPLASTY Right 06/15/2018   Procedure: RIGHT ELBOW PROXIMAL RADIOULNAR JOINT DEBRIDEMENT AND ARTHROPLASTY;  Surgeon: Arvil Birks, MD;  Location: [facility];  Service: Orthopedics;  Laterality: Right;  BLOCK WITH SEDATION   REPAIR UNDESENDED RIGHT TESTICLE / RIGHT INGUINAL HERNIA  AGE 73   RIGHT ANKLE ARTHROSCOPY W/ EXTENSIVE DEBRIDEMENT  04/05/2008   x2   RIGHT SHOULDER SURGERY  2006   RIGHT SUPRAOMOHYOID NECK DISSECTION   03/08/2003   ZONES 1,2,3;   SUBMANDIBULAR MASS / METASTATIC SQUAMOUS CELL CARCINOMA RIGHT NECK   SAVORY DILATION N/A 02/24/2018   Procedure: SAVORY DILATION;  Surgeon: Jolinda Necessary, MD;  Location: [facility];  Service: Endoscopy;  Laterality: N/A;   SEPTOPLASTY  06/2005   SHOULDER ARTHROSCOPY Left    SHOULDER ARTHROSCOPY  W/ SUBACROMIAL DECOMPRESSION AND DISTAL CLAVICLE EXCISION  10/09/2008   AND DEBRIDEMENT OF RIGHT SHOULDER IMPINGEMENT & AC JOINT ARTHRITIS   SPINE SURGERY  2016   l 3 TO l 4 PLATE AND SCREWS   TOTAL THYROIDECTOMY  11/03/2001   PAPILLARY THYROID  CARCINOMA   TRANSTHORACIC ECHOCARDIOGRAM  07/2011   grade I diastolic dysfunction/ ef 55-60%   ULNAR NERVE TRANSPOSITION Right 04/28/2014   Procedure: RIGHT ELBOW ULNA NERVE RELEASE TRANSPOSTION AND MEDIAL EPICONDYLAR DEBRIDEMENT AND REPAIR;  Surgeon: Shellie Dials, MD;  Location: [facility];  Service: Orthopedics;  Laterality: Right;   Patient Active Problem List   Diagnosis Date Noted   Primary osteoarthritis of both knees 09/22/2023   Boil 08/26/2023   Abscess 08/20/2023   Cellulitis 05/12/2023   Bruise of lower lip 04/17/2023   Wheezing 03/31/2023   Injury of medial collateral ligament (MCL) of knee 02/07/2023   Sprain of medial collateral ligament of right knee 02/07/2023   Osteoarthritis of subtalar joint 09/25/2022   Cystoid macular edema of right eye 07/18/2022   Hyperglycemia 07/18/2022   Nonexudative age-related macular degeneration, left eye, early dry stage 07/18/2022   Retinal edema 07/18/2022   Superficial punctate keratitis of right eye 07/18/2022   Grief at loss of child 05/22/2022   Actinic keratoses 05/22/2022    Pain in joint of left elbow 04/04/2022   Abnormal defecation 02/26/2022   Atrophic gastritis 02/26/2022   Bipolar 1 disorder (HCC) 02/26/2022   BPH (benign prostatic hyperplasia) 02/26/2022   Bronchiolectasis (HCC) 02/26/2022   Disease of thyroid  gland 02/26/2022   Diverticular disease of colon 02/26/2022   Family history of malignant neoplasm of digestive organs 02/26/2022   Hyperlipidemia 02/26/2022   IgG deficiency (HCC) 02/26/2022   Immune deficiency disorder (HCC) 02/26/2022   Irritable bowel syndrome 02/26/2022   Pancreatic cyst 02/26/2022   History of colonic polyps 02/26/2022   Portosystemic shunt, 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/26/2022   Pyloric ulcer 02/26/2022   Scoliosis 02/26/2022   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   Spinal stenosis of lumbar region 02/26/2022   Prostate cancer (HCC) 02/26/2022   Dysuria 10/03/2021   Earache 06/14/2021   Anemia 06/14/2021   History of head and neck cancer 03/28/2021   Swelling of right parotid gland 03/28/2021   Epistaxis, recurrent 06/26/2020   PMR (polymyalgia rheumatica) (HCC) 02/22/2020   Diverticulitis 01/25/2020   Abdominal pain 01/25/2020   Iliac vessel injury 11/30/2019   Stress at home 09/14/2019   LLQ abdominal pain 09/06/2019   IPMN (intraductal papillary mucinous neoplasm) 07/19/2019   History of thyroid  cancer 06/03/2019   Abnormal findings on diagnostic imaging of liver 06/03/2019   Epididymitis 03/16/2019   Difficulty urinating 03/16/2019   Pain in right foot 03/12/2019   Hypothyroidism (acquired) 09/29/2018   Moderate persistent asthma with acute exacerbation 09/29/2018   Prediabetes 09/29/2018   RTI (respiratory tract infection) 09/29/2018   ETD (Eustachian tube dysfunction), bilateral 09/02/2018   Sensorineural hearing loss (SNHL) of both ears 07/31/2018   Bruising 11/17/2017   Encounter for other orthopedic aftercare 11/14/2017   Cicatricial lagophthalmos of left lower eyelid  10/31/2017   Closed fracture of head of right radius 10/24/2017   Palatal mass 09/16/2017   Sore throat 09/16/2017   Testicular pain, right 08/01/2017   Combined form of age-related cataract, left eye 05/19/2017   Dry eye syndrome of both eyes 05/19/2017   Primary open angle glaucoma (POAG) of left eye, mild stage 05/19/2017   Radiation retinopathy,  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   Secondary glaucoma due to combination mechanisms, right, severe stage 05/19/2017   Fatigue 01/21/2017   Ankle pain, left 01/21/2017   Fall (on) (from) other stairs and steps, initial encounter 11/11/2016   Abrasion of head 11/11/2016   Ascending aortic aneurysm (HCC) 07/23/2016   Erectile dysfunction 07/19/2016   Bronchiectasis (HCC) 04/23/2016   Chronic bronchitis (HCC) 03/11/2016   Cerumen impaction 08/04/2015   Bilateral impacted cerumen 08/04/2015   Cervical disc disorder with radiculopathy of cervical region 06/06/2014   Elevated WBC count 06/06/2014   Iron  deficiency anemia 06/06/2014   Edema 02/21/2014   Tinnitus 02/21/2014   Well adult exam 05/31/2013   Glaucoma 05/31/2013   RML pneumonia 03/31/2013   Hypertension, uncontrolled 02/25/2013   Anxiety disorder 08/08/2010   Chest pain, atypical 02/08/2010   ESOPHAGEAL STRICTURE 11/30/2009   Osteoarthritis 08/07/2009   Malaise and fatigue 05/14/2008   Coronary atherosclerosis 11/04/2007   Lumbago 11/04/2007   THYROIDECTOMY, HX OF 11/04/2007   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    PCP: Genia Kettering, MD  REFERRING PROVIDER: Anastasio Kaska, PA-C  REFERRING DIAG: [phone] (ICD-10-CM) - Right knee pain M25.562 (ICD-10-CM) - Left knee pain R60.0 Bilateral edema of lower extremity  THERAPY DIAG:  No diagnosis found.  Rationale for Evaluation and Treatment: Rehabilitation  ONSET DATE: over a year ago  SUBJECTIVE:   SUBJECTIVE STATEMENT: 12/04/2023 Pt reports nothing new or different. Pt states R knee bothered him a little bit  after last session but did feel better after one day.   From initial evaluation:  Pt states he has BIL knee pain (L>R) for over a year at this point, first noticed difficulty with floor transfers and then worsened to the point where the majority of transfers bothered knees to some extent. Particularly bothersome with stairs. He denies any issues with swelling or N/T. He does endorse some chronic ankle swelling bilaterally that he is in communication w/ providers about. We also discuss his complex medical history in depth today, particularly in regard to his back for which he states he will likely be receiving an epidural at some point for. He does mention that he received BIL knee injections ~3 days ago with about 50% relief.   PERTINENT HISTORY: anemia, anxiety, bipolar 1, BPH, basal cell carcinoma, other hx of cancer, CVID, depression, GERD, HTN, PVD, vertigo, thoracic aortic aneurysm, hx thyroidectomy, hx lumbar fusion  PAIN:  Are you having pain: no Location/description: BIL knees (R>L), anterior knee  NPRS 0/10  - aggravating factors: stair navigation, kneeling, floor transfers, transfers from lower surfaces  - Easing factors: injections, positional changes    PRECAUTIONS: cancer history, aortic aneurysm (no isometrics)  WEIGHT BEARING RESTRICTIONS: No  FALLS:  Has patient fallen in last 6 months? 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 Does have some limitations due to back issues  PATIENT GOALS: would like to be more active  NEXT MD VISIT: TBD, plans to follow with Dr. Lydia Sams beginning of April   OBJECTIVE:  Note: Objective measures were completed at Evaluation unless otherwise noted.  DIAGNOSTIC FINDINGS:  Recent MRI  (09/29/23) for lumbar spine, no recent LE imaging in chart - pt states he had XR done through [facility],  showed arthritis and edema in both knees, states they didn't feel he needed an MRI   PATIENT SURVEYS:  LEFS 38/80  POSTURE: fwd head, rounded shoulders  PALPATION: Concordant tenderness anterior knees BIL, along joint line and patellar tendons. R is more tender to the touch than the L although the L is reportedly more symptomatic   LOWER EXTREMITY ROM:      Right eval Left eval R/L  Hip flexion     Hip extension     Hip internal rotation     Hip external rotation     Knee extension full Full (passively, does have mild limitation actively against gravity)    Knee flexion 130 deg * 110 deg * 125/120 (in prone)  (Blank rows = not tested) (Key: WFL = within functional limits not formally assessed, * = concordant pain, s = stiffness/stretching sensation, NT = not tested)  Comments:    LOWER EXTREMITY MMT:    MMT Right eval Left eval  Hip flexion 4 4-  Hip abduction (modified sitting) 4 4  Hip internal rotation    Hip external rotation    Knee flexion 4+ 4  Knee extension 4+ 4  Ankle dorsiflexion 4 4   (Blank rows = not tested) (Key: WFL = within functional limits not formally assessed, * = concordant pain, s = stiffness/stretching sensation, NT = not tested)  Comments:     FUNCTIONAL TESTS:  5xSTS: 12.7sec no UE support, crepitus BIL, L knee pain  GAIT: Distance walked: within clinic Assistive device utilized: None Level of assistance: Complete Independence Comments: widened BOS, reduced truncal rotation, reduced knee ROM BIL throughout all phases of gait but more prominent on L                                                                                                                                 TREATMENT DATE:  [facility] Adult PT Treatment:                                                DATE: 12/04/23 Therapeutic Exercise: UBE L1; 3 min fwd, 3 min bwd Prone quad  stretch with strap x30" Prone hamstring curl 5# 2x10 Manual Therapy:  Neuromuscular re-ed:  Therapeutic Activity: Leg press 100# double leg 2x10 Leg press 70# single leg 2x10 Lunge matrix 2x10  Single leg sit to stand eccentric 2x10 Gait:  Modalities:  Self Care:    [facility] Adult PT Treatment:                                                DATE: 12/02/23 Therapeutic Exercise: UBE L1; 3 min fwd, 3 min bwd Therapeutic  activity: Review of gym equipment pt can perform outside of PT (how to adjust and how to set up) Leg press 100# double leg 2x10 Leg press 70# single leg 2x10 Hip abductor machine 70# 2x10 Cables 10# donkey kick 2x10 Cables 5-10# hip abd 2x10 Hip adduction machine 85# 2x10   [facility] Adult PT Treatment:                                                DATE: 11/27/23 Therapeutic Exercise: Nustep L6 x 5 min LEs Prone quad stretch 2x30" Prone hamstring curl blue TB 2x10 Prone hip abd blue TB x10, no resistance x10 Neuromuscular re-ed: Standing foot tap forward 2x10 Foot on stool and static stance 2x30" Therapeutic Activity: Partial forward lunge on to stool 2x10 Side lunge to stool 2x10 Heel raise 2x10 Runner's step up 4" step 2x10    [facility] Adult PT Treatment:                                                DATE: 11/20/23 Therapeutic Exercise: Nustep L6 x 5 min LEs Quad stretch with strap x30" Gastroc stretch standing x30" Heel raise off step 2x10 Therapeutic Activity: kneel to tall kneel x10, with 10# KB x10 Tall kneel to half kneel x5 Half kneel rock fwd/bwd with glute setting x20 Half kneel to lunge x10 Side step up stairs working on glute activation for his stairs at home 3x3 steps Eccentric step down 2x10    [facility] Adult PT Treatment:                                                DATE: 11/18/23 Therapeutic Exercise: Upright bike, L4 x 6 min for warm up Seated hamstring stretch 2x30" Standing quad stretch with strap 2x30" Standing gastroc stretch  x30" Supine SLR x10, with ER x10 Supine bridge x10, with marching x10 Therapeutic Activity: Forward step up on 4" step 2x10 focusing on glute activation Side step up on 4" step 2x10 focusing on glute activation    [facility] Adult PT Treatment:                                                DATE: 11/14/23 Therapeutic Exercise: Nu step L5 LE only during subjective  Seated leg curl machine 10# BIL 2x12 Seated leg extension machine 10# 2x8 BIL  Seated calf ext machine 10# 10 BIL Increased time spent w/ education/discussion re: safety w/ setting up gym equipment, principles of basic progression  Therapeutic Activity: STS 3x5 from standard chair cues for pacing and mechanics  6 inch step weight shift x10 BIL cues for mechanics    PATIENT EDUCATION:  Education details: rationale for interventions, HEP  Person educated: Patient Education method: Explanation, Demonstration, Tactile cues, Verbal cues Education comprehension: verbalized understanding, returned demonstration, verbal cues required, tactile cues required, and needs further education     HOME EXERCISE PROGRAM: Access Code: [email or web address] URL: [email or web address]  ASSESSMENT:  CLINICAL IMPRESSION:  12/04/2023 Rechecked some of pt's goals -- improved knee ROM and 5x STS. Able to demo good independence with using some of the gym equipment. Continued to progress single limb stability and stance without knee pain.    From Initial evaluation: Patient is a pleasant 68 y.o. gentleman who was seen today for physical therapy evaluation and treatment for bilateral knee pain ongoing over past year, much improved with recent injections. Primarily limited in closed chain activities involving knee flexion such as transfers and stair navigation. On exam he demonstrates concordant limitations in knee mobility and strength, L more affected than R. His 5xSTS time is right at cutoff score for fall risk in majority of populations (12-14sec,  varying) and elicits mild knee pain. No adverse events, tolerates exam/HEP well without increase in resting pain. Recommend trial of skilled PT to address aforementioned deficits with aim of improving functional tolerance and reducing pain with typical activities. Pt departs today's session in no acute distress, all voiced concerns/questions addressed appropriately from PT perspective.       OBJECTIVE IMPAIRMENTS: Abnormal gait, decreased activity tolerance, decreased endurance, decreased mobility, difficulty walking, decreased ROM, decreased strength, postural dysfunction, and pain.   ACTIVITY LIMITATIONS: standing, squatting, stairs, transfers, and locomotion level  PARTICIPATION LIMITATIONS: meal prep, cleaning, laundry, and community activity  PERSONAL FACTORS: Age, Time since onset of injury/illness/exacerbation, and 3+ comorbidities: anemia, anxiety, bipolar 1, BPH, basal cell carcinoma, other hx of cancer, CVID, depression, GERD, HTN, PVD, vertigo, thoracic aortic aneurysm, hx thyroidectomy, hx lumbar fusion  are also affecting patient's functional outcome.   REHAB POTENTIAL: Fair given chronicity and comorbidities  CLINICAL DECISION MAKING: Evolving/moderate complexity  EVALUATION COMPLEXITY: Moderate   GOALS:   SHORT TERM GOALS: Target date: 11/14/2023  Pt will demonstrate appropriate understanding and performance of initially prescribed HEP in order to facilitate improved independence with management of symptoms.  Baseline: HEP established  11/14/23: reports good HEP adherence  Goal status: MET  2. Pt will report at least 25% improvement in overall pain levels over past week in order to facilitate improved tolerance to typical daily activities.   Baseline: 0-8/10 Goal status: ONGOING  LONG TERM GOALS: Target date: 12/12/2023  Pt will score 50/80 or greater on LEFS in order to demonstrate improved perception of function due to symptoms (MCID 9 pts) Baseline: 38/80 Goal status:  INITIAL  2.  Pt will demonstrate at least 120 degrees of knee flexion AROM bilaterally in order to facilitate improved tolerance to gait/transfers.  Baseline: see ROM chart above Goal status: MET 12/04/23  3.  Pt will report at least 50% decrease in overall pain levels in past week in order to facilitate improved tolerance to basic ADLs/mobility.   Baseline: 0-8/10  Goal status: INITIAL    4.  Pt will be able to perform 5xSTS in less than or equal to 10 in order to demonstrate reduced fall risk and improved functional independence (MCID 5xSTS = 2.3 sec). Baseline: 12sec no UE support Goal status: MET - 12/04/23: 9 sec no UE support   5. Pt will report/demonstrate ability to navigate at least 13 steps w/o UE support and less than 3 pt increase in pain in order to facilitate improved tolerance to home navigation  Baseline: pain with stair navigation   Goal status: INITIAL   PLAN:  PT FREQUENCY: 1-2x/week  PT DURATION: 8 weeks  PLANNED INTERVENTIONS: [id number]- PT Re-evaluation, 97110-Therapeutic exercises, 97530- Therapeutic activity, [id number]- Neuromuscular re-education, 97535- Self Care, [id number]- Manual therapy, [id number]- Gait training, [phone]- Aquatic  Therapy, Patient/Family education, Balance training, Stair training, Taping, Dry Needling, Joint mobilization, Cryotherapy, and Moist heat  PLAN FOR NEXT SESSION: Review/update HEP PRN. Work on [employer] exercises as appropriate with emphasis on quad activation, knee flexion mobility (especially on L), closed chain stability and functional mechanics. Symptom modification strategies as indicated/appropriate. Mindful of complex medical history including aortic aneurysm, cancer. Interested in using gym equipment  Blessed Cotham April Ma L [place], [place], DPT 12/04/2023 1:00 PM    [facility] [address] [place], [name], [address] Phone: [phone]   Fax:  [phone]

## 2023-12-08 ENCOUNTER — Encounter (HOSPITAL_COMMUNITY): Payer: Self-pay | Admitting: Licensed Clinical Social Worker

## 2023-12-08 NOTE — Progress Notes (Signed)
 Virtual virtual Video Note  I connected with Alex [name] on 12/03/2023 at 2-3pm EST by video-enabled virtual visit. I verified that I am speaking with the correct person using  two identifiers.I discussed the limitations of evaluation and management by telemedicine and the availability of in person appointments. The patient expressed understanding and agreed to proceed.    LOCATION: Patient: Home  Provider: Home office   History of Present Illness:  Pt was referred by Dr. Arfeen for OP therapy for bipolar disorder and anxiety.  Treatment Goal Addressed:  Pt will meet with clinician weekly for therapy to monitor for progress towards goals and address any barriers to success; Reduce depression from average severity level of 6/10 down to a 4/10 in next 6 months by engaging in 1-2 positive coping skills daily as part of developing self-care routine; Reduce average anxiety level from 7/10 down to 5/10 in next 6 months by utilizing 1-2 relaxation skills/grounding skills per day, such as mindful breathing, progressive muscle relaxation, positive visualizations.  Progress towards Treatment Goal: Progressing   Observations/Objective: Patient presented for today's session on time and was alert, oriented x5, with no evidence or self-report of SI/HI or A/V H.  Patient reported ongoing compliance with medication and denied any use of alcohol  or illicit substances.  Clinician inquired about patient's current emotional ratings, as well as any significant changes in thoughts, feelings or behavior since previous session. Patient reported scores of  8/10 for depression, 8/10 for anxiety, 3/10 for anger/irritability. [name] and pt explored his emotional ratings, and discussed coping skills for stressors. 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 Pt discussed his current health issues and his fear of the future due to his health.  "I'm turning 47 tomorrow and I have feelings of depression due my age and health concerns." The majority of skill practice was focused on identifying specific problem (and priority/goal) and brainstorming options/alternatives.         Assessment and plan: Counselor will continue to meet with patient to address treatment plan goals. Patient will continue to follow recommendations of providers and imnd implement skills learned in session, and practice between sessions. Diagnosis: Bipolar 1 disorder.    Collaboration of care: Other:   Continue working with providers    Patient/Guardian was advised Release of Information must be obtained prior to any record release in order to collaborate their care with an outside provider. Patient/Guardian was advised if they have not already done so to contact the registration department to sign all necessary forms in order for us  to release information regarding their care.    Consent: Patient/Guardian gives verbal consent for treatment and assignment of benefits for services provided during this visit. Patient/Guardian expressed understanding and agreed to proceed.     Follow Up Instructions:  I discussed the assessment and treatment plan with the patient. The patient was provided an opportunity to ask questions and all were answered. The patient agreed with the plan and demonstrated an understanding of the instructions.   The patient was advised to call back or seek an in-person evaluation if the symptoms worsen or if the condition fails to improve as anticipated.  I provided 60 minutes of non-face-to-face time during this encounter.   Zamya Culhane  S, LCAS  12/03/23

## 2023-12-08 NOTE — Telephone Encounter (Signed)
 Called and spoke with Precious at [facility] and she had informed me that all is well and patient has been scheduled

## 2023-12-09 ENCOUNTER — Encounter: Payer: Self-pay | Admitting: Internal Medicine

## 2023-12-09 DIAGNOSIS — H4051X3 Glaucoma secondary to other eye disorders, right eye, severe stage: Secondary | ICD-10-CM | POA: Diagnosis not present

## 2023-12-09 DIAGNOSIS — H3589 Other specified retinal disorders: Secondary | ICD-10-CM | POA: Diagnosis not present

## 2023-12-09 DIAGNOSIS — H401121 Primary open-angle glaucoma, left eye, mild stage: Secondary | ICD-10-CM | POA: Diagnosis not present

## 2023-12-09 DIAGNOSIS — H25812 Combined forms of age-related cataract, left eye: Secondary | ICD-10-CM | POA: Diagnosis not present

## 2023-12-10 ENCOUNTER — Ambulatory Visit (HOSPITAL_COMMUNITY): Admitting: Licensed Clinical Social Worker

## 2023-12-10 ENCOUNTER — Encounter (HOSPITAL_COMMUNITY): Payer: Self-pay | Admitting: Licensed Clinical Social Worker

## 2023-12-10 DIAGNOSIS — F319 Bipolar disorder, unspecified: Secondary | ICD-10-CM | POA: Diagnosis not present

## 2023-12-10 NOTE — Progress Notes (Signed)
Virtual virtual Video Note  I connected with Tylen Baab Waltermire on 12/10/2023 at 2-3pm EST by video-enabled virtual visit. I verified that I am speaking with the correct person using  two identifiers.I discussed the limitations of evaluation and management by telemedicine and the availability of in person appointments. The patient expressed understanding and agreed to proceed.    LOCATION: Patient: Home  Provider: Home office   History of Present Illness:  Pt was referred by Dr. Arfeen for OP therapy for bipolar disorder and anxiety.  Treatment Goal Addressed:  Pt will meet with clinician weekly for therapy to monitor for progress towards goals and address any barriers to success; Reduce depression from average severity level of 6/10 down to a 4/10 in next 6 months by engaging in 1-2 positive coping skills daily as part of developing self-care routine; Reduce average anxiety level from 7/10 down to 5/10 in next 6 months by utilizing 1-2 relaxation skills/grounding skills per day, such as mindful breathing, progressive muscle relaxation, positive visualizations.  Progress towards Treatment Goal: Progressing   Observations/Objective: Patient presented for today's session on time and was alert, oriented x5, with no evidence or self-report of SI/HI or A/V H.  Patient reported ongoing compliance with medication and denied any use of alcohol  or illicit substances. Clinician inquired about patient's current emotional ratings, as well as any significant changes in thoughts, feelings or behavior since previous session. Patient reported scores of  8/10 for depression, 8/10 for anxiety, 3/10 for anger/irritability. [name] and pt explored his emotional ratings, and discussed coping skills for stressors. Pt identifies stressors and  allowed pt to explore and express thoughts and feelings associated with recent life situations and external stressors. 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 Pt discussed his current health issues and his fear of the future due to his health, due to his turning 31 tomorrow. I continue to have feelings of depression due my age and health concerns. Clinician utilized CBT to process concerns about health, worries about future, and family challenges.       Assessment and plan: Counselor will continue to meet with patient to address treatment plan goals. Patient will continue to follow recommendations of providers and imnd implement skills learned in session, and practice between sessions. Diagnosis: Bipolar 1 disorder.    Collaboration of care: Other:   Continue working with providers    Patient/Guardian was advised Release of Information must be obtained prior to any record release in order to collaborate their care with an outside provider. Patient/Guardian was advised if they have not already done so to contact the registration department to sign all necessary forms in order for us  to release information regarding their care.    Consent: Patient/Guardian gives verbal consent for treatment and assignment of benefits for services provided during this visit. Patient/Guardian expressed understanding and agreed to proceed.     Follow Up Instructions:  I discussed the assessment and treatment plan with the patient. The patient was provided an opportunity to ask questions and all were answered. The patient agreed with the plan and demonstrated an understanding of the instructions.   The patient was advised to call back or seek an in-person evaluation if the symptoms worsen or if the condition fails to improve as anticipated.  I provided 60 minutes of non-face-to-face time during this encounter.   Leeona Mccardle S, LCAS  12/10/23 

## 2023-12-14 ENCOUNTER — Other Ambulatory Visit: Payer: Self-pay | Admitting: Internal Medicine

## 2023-12-14 DIAGNOSIS — R739 Hyperglycemia, unspecified: Secondary | ICD-10-CM

## 2023-12-15 ENCOUNTER — Ambulatory Visit (HOSPITAL_BASED_OUTPATIENT_CLINIC_OR_DEPARTMENT_OTHER)

## 2023-12-16 DIAGNOSIS — Z981 Arthrodesis status: Secondary | ICD-10-CM | POA: Diagnosis not present

## 2023-12-16 DIAGNOSIS — M5010 Cervical disc disorder with radiculopathy, unspecified cervical region: Secondary | ICD-10-CM | POA: Diagnosis not present

## 2023-12-16 DIAGNOSIS — M48061 Spinal stenosis, lumbar region without neurogenic claudication: Secondary | ICD-10-CM | POA: Diagnosis not present

## 2023-12-17 ENCOUNTER — Ambulatory Visit (INDEPENDENT_AMBULATORY_CARE_PROVIDER_SITE_OTHER): Admitting: Licensed Clinical Social Worker

## 2023-12-17 DIAGNOSIS — F319 Bipolar disorder, unspecified: Secondary | ICD-10-CM | POA: Diagnosis not present

## 2023-12-18 ENCOUNTER — Ambulatory Visit (HOSPITAL_BASED_OUTPATIENT_CLINIC_OR_DEPARTMENT_OTHER): Attending: Physician Assistant | Admitting: Physical Therapy

## 2023-12-18 ENCOUNTER — Encounter (HOSPITAL_BASED_OUTPATIENT_CLINIC_OR_DEPARTMENT_OTHER): Payer: Self-pay | Admitting: Physical Therapy

## 2023-12-18 DIAGNOSIS — R2689 Other abnormalities of gait and mobility: Secondary | ICD-10-CM | POA: Diagnosis not present

## 2023-12-18 DIAGNOSIS — M6281 Muscle weakness (generalized): Secondary | ICD-10-CM | POA: Diagnosis not present

## 2023-12-18 DIAGNOSIS — M25562 Pain in left knee: Secondary | ICD-10-CM | POA: Insufficient documentation

## 2023-12-18 DIAGNOSIS — M25561 Pain in right knee: Secondary | ICD-10-CM | POA: Insufficient documentation

## 2023-12-18 DIAGNOSIS — G8929 Other chronic pain: Secondary | ICD-10-CM | POA: Insufficient documentation

## 2023-12-18 NOTE — Therapy (Signed)
 OUTPATIENT PHYSICAL THERAPY LOWER EXTREMITY TREATMENT   Patient Name: Alex [name] MRN: [id number] DOB:[birth year], 68 y.o., male Today's Date: 12/18/2023  END OF SESSION:  PT End of Session - 12/18/23 1347     Visit Number 11    Number of Visits 17    Date for PT Re-Evaluation 01/31/24    Authorization Type BCBS    PT Start Time 1346    PT Stop Time 1429    PT Time Calculation (min) 43 min    Activity Tolerance Patient tolerated treatment well               Past Medical History:  Diagnosis Date   Anemia    Anxiety    Atypical nevus 04/12/1997   dyplastic-left chest below nipple   Atypical nevus 01/18/2005   slight-mod-mid upper abd, slight-mod-right lateral chest-(WS), slight-mod-mid lower back (punch)   Atypical nevus 05/31/2005   dysplastic-central lowerback (exc), dysplastic- right abdomen (Exc)   Basal cell carcinoma 06/04/2016   back of neck   Bipolar I disorder (HCC)    Bleeding ulcer 2016   BPH (benign prostatic hypertrophy) with urinary obstruction    Cancer (HCC)    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 Cheril Cork, oncology. Receives monthly IVIG 40 grams.   Depression    Epicondylitis    right elbow   GERD (gastroesophageal reflux disease)    Glaucoma BOTH EYES   RIGHT EYE RADIATION DAMAGE   Hearing loss    Bilateral   Hepatic cyst    Several, The lesion of concern in segment 6 of the liver has single large portal vein and hepatic vein  branches extending to tt, in a pattern of enhancement which mirrors these vascular structures. 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 9 CM X 4. 3 CM PER NOV 06-14-17  CHEST CTFOLOWED BY DR CRENSHAW YEARLY FOR   Positional vertigo    HX OF WITH SINUS  INFECTIONS   Prostate cancer [facility])    Follows w/ Dr. Andy Bannister Polascik @ [facility].   Radial head fracture    Right   Squamous cell carcinoma of skin 06/04/2016   in situ-crown of scalp   Squamous cell carcinoma of skin 04/22/2017   in situ-crown scalp (txpbx)   Thoracic aortic aneurysm (HCC) 06/14/2017   last CT 4.1 CM Mild   Tinnitus    CONSTANT   Ulnar nerve compression    right elbow   Unsteady gait    especially with stairs, depth perception off   Urinary hesitancy    Wears glasses    Past Surgical History:  Procedure Laterality Date   CARDIAC CATHETERIZATION  01-16-2006  DR Andy Bannister WALL   MILD CORONARY ATHEROSCLEROSIS/ MID TO DISTAL LAD 40% STENOSIS/ LVF 50-55%   CARPAL TUNNEL RELEASE Right 11/03/2017   Procedure: RIGHT HAND CARPAL TUNNEL RELEASE;  Surgeon: Arvil Birks, MD;  Location: [facility];  Service: Orthopedics;  Laterality: Right;   CATARACT EXTRACTION Right    COLONSCOPY  2017 LAST DONE   MULTIPLE   CYST EXCISION Right 09/04/2023   Procedure: EXCISION CYST RIGHT SHOULDER;  Surgeon: Oralee Billow, MD;  Location: [facility];  Service: General;  Laterality: Right;  LOCAL & MAC   ENDOSCOPY  LAST 2017   MULTIPLE DONE DILATION DONE ALSO   ESOPHAGOGASTRODUODENOSCOPY (EGD) WITH PROPOFOL  N/A 02/24/2018   Procedure: ESOPHAGOGASTRODUODENOSCOPY (EGD) WITH PROPOFOL ;  Surgeon: Jolinda Necessary, MD;  Location: [facility];  Service: Endoscopy;  Laterality: N/A;   EXCISION RADIAL HEAD Right 11/03/2017   Procedure: RIGHT PROXIMAL RADIUS RADIAL HEAD RESECTION AND JOINT DEBRIDEMENT;  Surgeon: Arvil Birks, MD;  Location[place] [facility] [place];  Service: Orthopedics;  Laterality: Right;   EXTRACORPOREAL SHOCK WAVE LITHOTRIPSY Right 11/20/2020   Procedure: EXTRACORPOREAL SHOCK WAVE LITHOTRIPSY (ESWL);  Surgeon: Osborn Blaze, MD;  Location: [facility];  Service: Urology;  Laterality: Right;  75 MINS   KNEE ARTHROSCOPY  05/01/2012   Procedure: ARTHROSCOPY KNEE;  Surgeon: Loel Ring, MD;  Location: [facility];  Service: Orthopedics;  Laterality: Left;  debridement and removal of loose body   LEFT ANKLE ARTHROSCOPY W/ DEBRIDEMENT  05/12/2007   LEFT HYDROCELECTOMY  03/29/2005   AND REPAIR LEFT INGUINAL HERNIA W/ MESH   MOHS SURGERY  2002   RIGHT ORBITAL CANCER   NASAL ENDOSCOPY  08/07/2005   RIGHT EPISTAXIS  / POST SEPTOPLASTY  (HX RIGHT ORBITAL CA & S/P RADIATION/ NECROSIS ANTERIOR END OF BOTH INFERIOR TURBINATES)   occuloplastic surgery  2002   PARS PLANA VITRECTOMY  11/06/2004   RIGHT EYE RADIATION RETINOPATHY W/ HEMORRHAGE   PROSTATE ABLATION N/A 06/2022   RADIAL HEAD ARTHROPLASTY Right 06/15/2018   Procedure: RIGHT ELBOW PROXIMAL RADIOULNAR JOINT DEBRIDEMENT AND ARTHROPLASTY;  Surgeon: Arvil Birks, MD;  Location: [facility]  SUR[facility]ERY [facility];  Service: Orthopedics;  Laterality: Right;  BLOCK WITH SEDATION   REPAIR UNDESENDED RIGHT TESTICLE / RIGHT INGUINAL HERNIA  AGE 56   RIGHT ANKLE ARTHROSCOPY W/ EXTENSIVE DEBRIDEMENT  04/05/2008   x2   RIGHT SHOULDER SURGERY  2006   RIGHT SUPRAOMOHYOID NECK DISSECTION   03/08/2003   ZONES 1,2,3;   SUBMANDIBULAR MASS / METASTATIC SQUAMOUS CELL CARCINOMA RIGHT NECK   SAVORY DILATION N/A 02/24/2018   Procedure: SAVORY DILATION;  Surgeon: Jolinda Necessary, MD;  Location: [facility];  Service: Endoscopy;  Laterality: N/A;   SEPTOPLASTY  06/2005   SHOULDER ARTHROSCOPY Left  SHOULDER ARTHROSCOPY W/ SUBACROMIAL DECOMPRESSION AND DISTAL CLAVICLE EXCISION  10/09/2008   AND DEBRIDEMENT OF RIGHT SHOULDER IMPINGEMENT & AC JOINT ARTHRITIS   SPINE SURGERY  2016   l 3 TO l 4 PLATE AND SCREWS   TOTAL THYROIDECTOMY  11/03/2001   PAPILLARY THYROID  CARCINOMA   TRANSTHORACIC ECHOCARDIOGRAM  07/2011   grade I diastolic dysfunction/ ef 55-60%   ULNAR NERVE TRANSPOSITION Right 04/28/2014   Procedure: RIGHT ELBOW ULNA NERVE RELEASE TRANSPOSTION AND MEDIAL EPICONDYLAR DEBRIDEMENT AND REPAIR;  Surgeon: Shellie Dials, MD;  Location:  SURGERY CENTER;  Service: Orthopedics;  Laterality: Right;   Patient Active Problem List   Diagnosis Date Noted   Primary osteoarthritis of both knees 09/22/2023   Boil 08/26/2023   Abscess 08/20/2023   Cellulitis 05/12/2023   Bruise of lower lip 04/17/2023   Wheezing 03/31/2023   Injury of medial collateral ligament (MCL) of knee 02/07/2023   Sprain of medial collateral ligament of right knee 02/07/2023   Osteoarthritis of subtalar joint 09/25/2022   Cystoid macular edema of right eye 07/18/2022   Hyperglycemia 07/18/2022   Nonexudative age-related macular degeneration, left eye, early dry stage 07/18/2022   Retinal edema 07/18/2022   Superficial punctate keratitis of right eye 07/18/2022   Grief at loss of child 05/22/2022   Actinic keratoses  05/22/2022   Pain in joint of left elbow 04/04/2022   Abnormal defecation 02/26/2022   Atrophic gastritis 02/26/2022   Bipolar 1 disorder (HCC) 02/26/2022   BPH (benign prostatic hyperplasia) 02/26/2022   Bronchiolectasis (HCC) 02/26/2022   Disease of thyroid  gland 02/26/2022   Diverticular disease of colon 02/26/2022   Family history of malignant neoplasm of digestive organs 02/26/2022   Hyperlipidemia 02/26/2022   IgG deficiency (HCC) 02/26/2022   Immune deficiency disorder (HCC) 02/26/2022   Irritable bowel syndrome 02/26/2022   Pancreatic cyst 02/26/2022   History of colonic polyps 02/26/2022   Portosystemic shunt, 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/26/2022   Pyloric ulcer 02/26/2022   Scoliosis 02/26/2022   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   Spinal stenosis of lumbar region 02/26/2022   Prostate cancer (HCC) 02/26/2022   Dysuria 10/03/2021   Earache 06/14/2021   Anemia 06/14/2021   History of head and neck cancer 03/28/2021   Swelling of right parotid gland 03/28/2021   Epistaxis, recurrent 06/26/2020   PMR (polymyalgia rheumatica) (HCC) 02/22/2020   Diverticulitis 01/25/2020   Abdominal pain 01/25/2020   Iliac vessel injury 11/30/2019   Stress at home 09/14/2019   LLQ abdominal pain 09/06/2019   IPMN (intraductal papillary mucinous neoplasm) 07/19/2019   History of thyroid  cancer 06/03/2019   Abnormal findings on diagnostic imaging of liver 06/03/2019   Epididymitis 03/16/2019   Difficulty urinating 03/16/2019   Pain in right foot 03/12/2019   Hypothyroidism (acquired) 09/29/2018   Moderate persistent asthma with acute exacerbation 09/29/2018   Prediabetes 09/29/2018   RTI (respiratory tract infection) 09/29/2018   ETD (Eustachian tube dysfunction), bilateral 09/02/2018   Sensorineural hearing loss (SNHL) of both ears 07/31/2018   Bruising 11/17/2017   Encounter for other orthopedic aftercare 11/14/2017   Cicatricial lagophthalmos of left  lower eyelid 10/31/2017   Closed fracture of head of right radius 10/24/2017   Palatal mass 09/16/2017   Sore throat 09/16/2017   Testicular pain, right 08/01/2017   Combined form of age-related cataract, left eye 05/19/2017   Dry eye syndrome of both eyes 05/19/2017   Primary open angle glaucoma (POAG) of left eye, mild stage 05/19/2017  Radiation retinopathy, 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   Secondary glaucoma due to combination mechanisms, right, severe stage 05/19/2017   Fatigue 01/21/2017   Ankle pain, left 01/21/2017   Fall (on) (from) other stairs and steps, initial encounter 11/11/2016   Abrasion of head 11/11/2016   Ascending aortic aneurysm (HCC) 07/23/2016   Erectile dysfunction 07/19/2016   Bronchiectasis (HCC) 04/23/2016   Chronic bronchitis (HCC) 03/11/2016   Cerumen impaction 08/04/2015   Bilateral impacted cerumen 08/04/2015   Cervical disc disorder with radiculopathy of cervical region 06/06/2014   Elevated WBC count 06/06/2014   Iron  deficiency anemia 06/06/2014   Edema 02/21/2014   Tinnitus 02/21/2014   Well adult exam 05/31/2013   Glaucoma 05/31/2013   RML pneumonia 03/31/2013   Hypertension, uncontrolled 02/25/2013   Anxiety disorder 08/08/2010   Chest pain, atypical 02/08/2010   ESOPHAGEAL STRICTURE 11/30/2009   Osteoarthritis 08/07/2009   Malaise and fatigue 05/14/2008   Coronary atherosclerosis 11/04/2007   Lumbago 11/04/2007   THYROIDECTOMY, HX OF 11/04/2007   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    PCP: Genia Kettering, MD  REFERRING PROVIDER: Anastasio Kaska, PA-C  REFERRING DIAG: [phone] (ICD-10-CM) - Right knee pain M25.562 (ICD-10-CM) - Left knee pain R60.0 Bilateral edema of lower extremity  THERAPY DIAG:  Chronic pain of both knees  Other abnormalities of gait and mobility  Muscle weakness (generalized)  Rationale for Evaluation and Treatment: Rehabilitation  ONSET DATE: over a year ago  SUBJECTIVE:   SUBJECTIVE  STATEMENT: Saw spine MD and having epidural at L5-S1 and possibly fusion end of year. Considering stimulator.  The knees seem a little better, less pain with stairs, trying to strengthen thighs.   From initial evaluation:  Pt states he has BIL knee pain (L>R) for over a year at this point, first noticed difficulty with floor transfers and then worsened to the point where the majority of transfers bothered knees to some extent. Particularly bothersome with stairs. He denies any issues with swelling or N/T. He does endorse some chronic ankle swelling bilaterally that he is in communication w/ providers about. We also discuss his complex medical history in depth today, particularly in regard to his back for which he states he will likely be receiving an epidural at some point for. He does mention that he received BIL knee injections ~3 days ago with about 50% relief.   PERTINENT HISTORY: anemia, anxiety, bipolar 1, BPH, basal cell carcinoma, other hx of cancer, CVID, depression, GERD, HTN, PVD, vertigo, thoracic aortic aneurysm, hx thyroidectomy, hx lumbar fusion  PAIN:  Are you having pain: no Location/description: BIL knees (R>L), anterior knee  NPRS 0/10  - aggravating factors: stair navigation, kneeling, floor transfers, transfers from lower surfaces  - Easing factors: injections, positional changes    PRECAUTIONS: cancer history, aortic aneurysm (no isometrics)  WEIGHT BEARING RESTRICTIONS: No  FALLS:  Has patient fallen in last 6 months? 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 Does have some limitations due to back issues  PATIENT GOALS: would like to be more active  NEXT MD VISIT: TBD, plans to follow with Dr. Lydia Sams beginning of  April   OBJECTIVE:  Note: Objective measures were completed at Evaluation unless otherwise noted.  DIAGNOSTIC FINDINGS:  Recent  MRI (09/29/23) for lumbar spine, no recent LE imaging in chart - pt states he had XR done through [facility], showed arthritis and edema in both knees, states they didn't feel he needed an MRI   PATIENT SURVEYS:  LEFS 38/80 LEFS 5/8: 41/80  POSTURE: fwd head, rounded shoulders  PALPATION: Concordant tenderness anterior knees BIL, along joint line and patellar tendons. R is more tender to the touch than the L although the L is reportedly more symptomatic   LOWER EXTREMITY ROM:      Right eval Left eval R/L  Hip flexion     Hip extension     Hip internal rotation     Hip external rotation     Knee extension full Full (passively, does have mild limitation actively against gravity)    Knee flexion 130 deg * 110 deg * 125/120 (in prone)  (Blank rows = not tested) (Key: WFL = within functional limits not formally assessed, * = concordant pain, s = stiffness/stretching sensation, NT = not tested)  Comments:    LOWER EXTREMITY MMT:    MMT Right eval Left eval Rt/Lt (lb) 5/8  Hip flexion 4 4- 25/31.2  Hip abduction (modified sitting) 4 4 SL at knee 21.5/22.1  Hip internal rotation     Hip external rotation     Knee flexion 4+ 4 17.2/14.7  Knee extension 4+ 4 41.8/32.7  Ankle dorsiflexion 4 4    (Blank rows = not tested) (Key: WFL = within functional limits not formally assessed, * = concordant pain, s = stiffness/stretching sensation, NT = not tested)    FUNCTIONAL TESTS:  5xSTS: 12.7sec no UE support, crepitus BIL, L knee pain  GAIT: Distance walked: within clinic Assistive device utilized: None Level of assistance: Complete Independence Comments: widened BOS, reduced truncal rotation, reduced knee ROM BIL throughout all phases of gait but more prominent on L                                                                                                                                  TREATMENT DATE: Treatment                            5/8: Blank lines following charge title = not provided on this treatment date.   Manual:  TPDN No  There-ex: Core engagement Supine leg ext progressed to dead bug Bridge with abduction green tband Sit<>stand green tband at knees  There-Act: Re-evaluation Self Care:  Nuro-Re-ed: Weight shift to heels in standing Tandem balance SLS with foot to catch LOB Gait Training:    [facility] Adult PT Treatment:                                                DATE:  12/04/23 Therapeutic Exercise: UBE L1; 3 min fwd, 3 min bwd Prone quad stretch with strap x30" Prone hamstring curl 5# 2x10 Manual Therapy:  Neuromuscular re-ed:  Therapeutic Activity: Leg press 100# double leg 2x10 Leg press 70# single leg 2x10 Lunge matrix 2x10  Single leg sit to stand eccentric 2x10 Gait:  Modalities:  Self Care:    [facility] Adult PT Treatment:                                                DATE: 12/02/23 Therapeutic Exercise: UBE L1; 3 min fwd, 3 min bwd Therapeutic activity: Review of gym equipment pt can perform outside of PT (how to adjust and how to set up) Leg press 100# double leg 2x10 Leg press 70# single leg 2x10 Hip abductor machine 70# 2x10 Cables 10# donkey kick 2x10 Cables 5-10# hip abd 2x10 Hip adduction machine 85# 2x10    PATIENT EDUCATION:  Education details: rationale for interventions, HEP  Person educated: Patient Education method: Explanation, Demonstration, Tactile cues, Verbal cues Education comprehension: verbalized understanding, returned demonstration, verbal cues required, tactile cues required, and needs further education     HOME EXERCISE PROGRAM: Access Code: [email or web address] URL: [email or web address]  ASSESSMENT:  CLINICAL IMPRESSION: Pt has made significant progress since beginning PT but still presents with gross weakness and will benefit from further  strengthening to support strength/endurance needs for ADLs and workout program. Will extend POC for 6 weeks with focus of transitioning to a gym program with the fitness specialists.    From Initial evaluation: Patient is a pleasant 68 y.o. gentleman who was seen today for physical therapy evaluation and treatment for bilateral knee pain ongoing over past year, much improved with recent injections. Primarily limited in closed chain activities involving knee flexion such as transfers and stair navigation. On exam he demonstrates concordant limitations in knee mobility and strength, L more affected than R. His 5xSTS time is right at cutoff score for fall risk in majority of populations (12-14sec, varying) and elicits mild knee pain. No adverse events, tolerates exam/HEP well without increase in resting pain. Recommend trial of skilled PT to address aforementioned deficits with aim of improving functional tolerance and reducing pain with typical activities. Pt departs today's session in no acute distress, all voiced concerns/questions addressed appropriately from PT perspective.       OBJECTIVE IMPAIRMENTS: Abnormal gait, decreased activity tolerance, decreased endurance, decreased mobility, difficulty walking, decreased ROM, decreased strength, postural dysfunction, and pain.   ACTIVITY LIMITATIONS: standing, squatting, stairs, transfers, and locomotion level  PARTICIPATION LIMITATIONS: meal prep, cleaning, laundry, and community activity  PERSONAL FACTORS: Age, Time since onset of injury/illness/exacerbation, and 3+ comorbidities: anemia, anxiety, bipolar 1, BPH, basal cell carcinoma, other hx of cancer, CVID, depression, GERD, HTN, PVD, vertigo, thoracic aortic aneurysm, hx thyroidectomy, hx lumbar fusion are also affecting patient's functional outcome.   REHAB POTENTIAL: Fair given chronicity and comorbidities  CLINICAL DECISION MAKING: Evolving/moderate complexity  EVALUATION COMPLEXITY:  Moderate   GOALS:   SHORT TERM GOALS: Target date: 11/14/2023  Pt will demonstrate appropriate understanding and performance of initially prescribed HEP in order to facilitate improved independence with management of symptoms.  Baseline: HEP established  11/14/23: reports good HEP adherence  Goal status: MET  2. Pt will report at least 25% improvement in overall pain levels over past week in order to facilitate improved tolerance  to typical daily activities.   Baseline:  Goal status: achieved  LONG TERM GOALS: Target date: 12/12/2023  Pt will score 50/80 or greater on LEFS in order to demonstrate improved perception of function due to symptoms (MCID 9 pts) Baseline: 38/80, 5/8: 41/80 Goal status: INITIAL  2.  Pt will demonstrate at least 120 degrees of knee flexion AROM bilaterally in order to facilitate improved tolerance to gait/transfers.  Baseline: see ROM chart above Goal status: MET 12/04/23  3.  Pt will report at least 50% decrease in overall pain levels in past week in order to facilitate improved tolerance to basic ADLs/mobility.   Baseline: minimal amount of pain  Goal status: met    4.  Pt will be able to perform 5xSTS in less than or equal to 10 in order to demonstrate reduced fall risk and improved functional independence (MCID 5xSTS = 2.3 sec). Baseline: 12sec no UE support Goal status: MET - 12/04/23: 9 sec no UE support   5. Pt will report/demonstrate ability to navigate at least 13 steps w/o UE support and less than 3 pt increase in pain in order to facilitate improved tolerance to home navigation  Baseline: minimal pain and only using hand rail for safety   Goal status: met 6. MMT gross improvement by 10%  Goal status: Initial 7. Prepared for transition to gym program  Goal status: Initial   PLAN:  PT FREQUENCY: 1/week  PT DURATION: 6 wks  PLANNED INTERVENTIONS: 97164- PT Re-evaluation, 97110-Therapeutic exercises, 97530- Therapeutic activity, [id number]-  Neuromuscular re-education, 97535- Self Care, 16109- Manual therapy, [phone]- Gait training, [phone]- Aquatic Therapy, Patient/Family education, Balance training, Stair training, Taping, Dry Needling, Joint mobilization, Cryotherapy, and Moist heat  PLAN FOR NEXT SESSION: Review/update HEP PRN. Work on [employer] exercises as appropriate with emphasis on quad activation, knee flexion mobility (especially on L), closed chain stability and functional mechanics. Symptom modification strategies as indicated/appropriate. Mindful of complex medical history including aortic aneurysm, cancer. Interested in using gym equipment  Keven Pel, PT, DPT 12/18/2023 3:56 PM    [facility] [address] [place], [name], [address] Phone: [phone]   Fax:  [phone]

## 2023-12-22 ENCOUNTER — Encounter (HOSPITAL_COMMUNITY): Payer: Self-pay | Admitting: Licensed Clinical Social Worker

## 2023-12-22 DIAGNOSIS — E119 Type 2 diabetes mellitus without complications: Secondary | ICD-10-CM | POA: Diagnosis not present

## 2023-12-22 DIAGNOSIS — T66XXXS Radiation sickness, unspecified, sequela: Secondary | ICD-10-CM | POA: Diagnosis not present

## 2023-12-22 DIAGNOSIS — H353121 Nonexudative age-related macular degeneration, left eye, early dry stage: Secondary | ICD-10-CM | POA: Diagnosis not present

## 2023-12-22 DIAGNOSIS — H35351 Cystoid macular degeneration, right eye: Secondary | ICD-10-CM | POA: Diagnosis not present

## 2023-12-22 NOTE — Progress Notes (Signed)
 Virtual virtual Video Note  I connected with Alex [name] on 12/17/2023 at 2-3pm EST by video-enabled virtual visit. I verified that I am speaking with the correct person using  two identifiers.I discussed the limitations of evaluation and management by telemedicine and the availability of in person appointments. The patient expressed understanding and agreed to proceed.    LOCATION: Patient: Home  Provider: Home office   History of Present Illness:  Pt was referred by Dr. Arfeen for OP therapy for bipolar disorder and anxiety.  Treatment Goal Addressed:  Pt will meet with clinician weekly for therapy to monitor for progress towards goals and address any barriers to success; Reduce depression from average severity level of 6/10 down to a 4/10 in next 6 months by engaging in 1-2 positive coping skills daily as part of developing self-care routine; Reduce average anxiety level from 7/10 down to 5/10 in next 6 months by utilizing 1-2 relaxation skills/grounding skills per day, such as mindful breathing, progressive muscle relaxation, positive visualizations.  Progress towards Treatment Goal: Progressing   Observations/Objective: Patient presented for today's session on time and was alert, oriented x5, with no evidence or self-report of SI/HI or A/V H.  Patient reported ongoing compliance with medication and denied any use of alcohol  or illicit substances. Clinician inquired about patient's current emotional ratings, as well as any significant changes in thoughts, feelings or behavior since previous session. Patient reported scores of  8/10 for depression, 8/10 for anxiety, 3/10 for anger/irritability. [name] and pt explored his emotional ratings, and discussed coping skills for his stressors. Pt identifies stressors and  allowed pt to explore and express thoughts and feelings associated with recent life situations and external stressors. 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 Pt discussed his current health issues and his fear of the future due to his health. I continue to have feelings of depression due my age and health concerns. Pt reports on his son's current mental health. "He's in the hospital for passing out at home and is on a psych hold. " [name] asked open ended questions about how this is affecting his own mental health.   Assessment and plan: Counselor will continue to meet with patient to address treatment plan goals. Patient will continue to follow recommendations of providers and imnd implement skills learned in session, and practice between sessions. Diagnosis: Bipolar 1 disorder.    Collaboration of care: Other:   Continue working with providers    Patient/Guardian was advised Release of Information must be obtained prior to any record release in order to collaborate their care with an outside provider. Patient/Guardian was advised if they have not already done so to contact the registration department to sign all necessary forms in order for us  to release information regarding their care.    Consent: Patient/Guardian gives verbal consent for treatment and assignment of benefits for services provided during this visit. Patient/Guardian expressed understanding and agreed to proceed.     Follow Up Instructions:  I discussed the assessment and treatment plan with the patient. The patient was provided an opportunity to ask questions and all were answered. The patient agreed with the plan and demonstrated an understanding of the instructions.   The patient was advised to call back or seek an in-person evaluation if the symptoms worsen or if the condition fails to improve as anticipated.  I provided 60 minutes of non-face-to-face time during this encounter.   Micahel Omlor S,  LCAS 12/17/23

## 2023-12-24 ENCOUNTER — Ambulatory Visit (INDEPENDENT_AMBULATORY_CARE_PROVIDER_SITE_OTHER): Admitting: Licensed Clinical Social Worker

## 2023-12-24 DIAGNOSIS — F319 Bipolar disorder, unspecified: Secondary | ICD-10-CM

## 2023-12-31 ENCOUNTER — Ambulatory Visit (HOSPITAL_COMMUNITY): Admitting: Licensed Clinical Social Worker

## 2023-12-31 ENCOUNTER — Ambulatory Visit (INDEPENDENT_AMBULATORY_CARE_PROVIDER_SITE_OTHER): Admitting: Licensed Clinical Social Worker

## 2023-12-31 ENCOUNTER — Ambulatory Visit (HOSPITAL_BASED_OUTPATIENT_CLINIC_OR_DEPARTMENT_OTHER): Payer: Self-pay

## 2023-12-31 ENCOUNTER — Encounter (HOSPITAL_BASED_OUTPATIENT_CLINIC_OR_DEPARTMENT_OTHER): Payer: Self-pay

## 2023-12-31 ENCOUNTER — Encounter (HOSPITAL_COMMUNITY): Payer: Self-pay | Admitting: Licensed Clinical Social Worker

## 2023-12-31 DIAGNOSIS — M6281 Muscle weakness (generalized): Secondary | ICD-10-CM | POA: Diagnosis not present

## 2023-12-31 DIAGNOSIS — F319 Bipolar disorder, unspecified: Secondary | ICD-10-CM | POA: Diagnosis not present

## 2023-12-31 DIAGNOSIS — G8929 Other chronic pain: Secondary | ICD-10-CM

## 2023-12-31 DIAGNOSIS — M19072 Primary osteoarthritis, left ankle and foot: Secondary | ICD-10-CM | POA: Diagnosis not present

## 2023-12-31 DIAGNOSIS — M25561 Pain in right knee: Secondary | ICD-10-CM | POA: Diagnosis not present

## 2023-12-31 DIAGNOSIS — R2689 Other abnormalities of gait and mobility: Secondary | ICD-10-CM | POA: Diagnosis not present

## 2023-12-31 DIAGNOSIS — M12572 Traumatic arthropathy, left ankle and foot: Secondary | ICD-10-CM | POA: Diagnosis not present

## 2023-12-31 DIAGNOSIS — M25562 Pain in left knee: Secondary | ICD-10-CM | POA: Diagnosis not present

## 2023-12-31 NOTE — Progress Notes (Signed)
 Virtual virtual Video Note  I connected with Alex [name] on 12/24/2023 at 2-3pm EST by video-enabled virtual visit. I verified that I am speaking with the correct person using  two identifiers.I discussed the limitations of evaluation and management by telemedicine and the availability of in person appointments. The patient expressed understanding and agreed to proceed.    LOCATION: Patient: Home  Provider: Home office   History of Present Illness:  Pt was referred by Dr. Arfeen for OP therapy for bipolar disorder and anxiety.  Treatment Goal Addressed:  Pt will meet with clinician weekly for therapy to monitor for progress towards goals and address any barriers to success; Reduce depression from average severity level of 6/10 down to a 4/10 in next 6 months by engaging in 1-2 positive coping skills daily as part of developing self-care routine; Reduce average anxiety level from 7/10 down to 5/10 in next 6 months by utilizing 1-2 relaxation skills/grounding skills per day, such as mindful breathing, progressive muscle relaxation, positive visualizations.  Progress towards Treatment Goal: Progressing   Observations/Objective: Patient presented for today's session on time and was alert, oriented x5, with no evidence or self-report of SI/HI or A/V H.  Patient reported ongoing compliance with medication and denied any use of alcohol  or illicit substances. Clinician inquired about patient's current emotional ratings, as well as any significant changes in thoughts, feelings or behavior since previous session. Patient reported scores of  8/10 for depression, 8/10 for anxiety, 3/10 for anger/irritability. [name] and pt explored his emotional ratings, and discussed coping skills for his stressors. Pt identifies stressors and  allowed pt to explore and express thoughts and feelings associated with recent life situations and external stressors. 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 Again,pt discussed his current health issues and his fear of the future due to his health. I continue to have feelings of depression due my age and health concerns. [name] provided psychoeducation on the cycle of depression.     Assessment and plan: Counselor will continue to meet with patient to address treatment plan goals. Patient will continue to follow recommendations of providers and imnd implement skills learned in session, and practice between sessions. Diagnosis: Bipolar 1 disorder.    Collaboration of care: Other:   Continue working with providers    Patient/Guardian was advised Release of Information must be obtained prior to any record release in order to collaborate their care with an outside provider. Patient/Guardian was advised if they have not already done so to contact the registration department to sign all necessary forms in order for us  to release information regarding their care.    Consent: Patient/Guardian gives verbal consent for treatment and assignment of benefits for services provided during this visit. Patient/Guardian expressed understanding and agreed to proceed.     Follow Up Instructions:  I discussed the assessment and treatment plan with the patient. The patient was provided an opportunity to ask questions and all were answered. The patient agreed with the plan and demonstrated an understanding of the instructions.   The patient was advised to call back or seek an in-person evaluation if the symptoms worsen or if the condition fails to improve as anticipated.  I provided 60 minutes of non-face-to-face time during this encounter.   Rosabel Sermeno S, LCAS 12/24/23

## 2023-12-31 NOTE — Therapy (Signed)
 OUTPATIENT PHYSICAL THERAPY LOWER EXTREMITY TREATMENT   Patient Name: Alex [name] MRN: [id number] DOB:1956/01/28, 68 y.o., male Today's Date: 12/31/2023  END OF SESSION:  PT End of Session - 12/31/23 1253     Visit Number 12    Number of Visits 17    Date for PT Re-Evaluation 01/31/24    Authorization Type BCBS    PT Start Time 1253   pt arrived late   PT Stop Time 1330    PT Time Calculation (min) 37 min    Activity Tolerance Patient tolerated treatment well    Behavior During Therapy WFL for tasks assessed/performed                Past Medical History:  Diagnosis Date   Anemia    Anxiety    Atypical nevus 04/12/1997   dyplastic-left chest below nipple   Atypical nevus 01/18/2005   slight-mod-mid upper abd, slight-mod-right lateral chest-(WS), slight-mod-mid lower back (punch)   Atypical nevus 05/31/2005   dysplastic-central lowerback (exc), dysplastic- right abdomen (Exc)   Basal cell carcinoma 06/04/2016   back of neck   Bipolar I disorder (HCC)    Bleeding ulcer 2016   BPH (benign prostatic hypertrophy) with urinary obstruction    Cancer (HCC)    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 Cheril Cork, oncology. Receives monthly IVIG 40 grams.   Depression    Epicondylitis    right elbow   GERD (gastroesophageal reflux disease)    Glaucoma BOTH EYES   RIGHT EYE RADIATION DAMAGE   Hearing loss    Bilateral   Hepatic cyst    Several, The lesion of  concern in segment 6 of the liver has single large portal vein and hepatic vein branches extending to tt, in a pattern of enhancement which mirrors these vascular structures. 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 9 CM X 4. 3 CM PER NOV 06-14-17  CHEST CTFOLOWED BY  DR Zorita Hiss FOR   Positional vertigo    HX OF WITH SINUS INFECTIONS   Prostate cancer [facility])    Follows w/ Dr. Andy Bannister Polascik @ [facility].   Radial head fracture    Right   Squamous cell carcinoma of skin 06/04/2016   in situ-crown of scalp   Squamous cell carcinoma of skin 04/22/2017   in situ-crown scalp (txpbx)   Thoracic aortic aneurysm (HCC) 06/14/2017   last CT 4.1 CM Mild   Tinnitus    CONSTANT   Ulnar nerve compression    right elbow   Unsteady gait    especially with stairs, depth perception off   Urinary hesitancy    Wears glasses    Past Surgical History:  Procedure Laterality Date   CARDIAC CATHETERIZATION  01-16-2006  DR Andy Bannister WALL   MILD CORONARY ATHEROSCLEROSIS/ MID TO DISTAL LAD 40% STENOSIS/ LVF 50-55%   CARPAL TUNNEL RELEASE Right 11/03/2017   Procedure: RIGHT HAND CARPAL  TUNNEL RELEASE;  Surgeon: Arvil Birks, MD;  Location: [facility];  Service: Orthopedics;  Laterality: Right;   CATARACT EXTRACTION Right    COLONSCOPY  2017 LAST DONE   MULTIPLE   CYST EXCISION Right 09/04/2023   Procedure: EXCISION CYST RIGHT SHOULDER;  Surgeon: Oralee Billow, MD;  Location: [facility];  Service: General;  Laterality: Right;  LOCAL & MAC   ENDOSCOPY  LAST 2017   MULTIPLE DONE DILATION DONE ALSO   ESOPHAGOGASTRODUODENOSCOPY (EGD) WITH PROPOFOL  N/A 02/24/2018   Procedure: ESOPHAGOGASTRODUODENOSCOPY (EGD) WITH PROPOFOL ;  Surgeon: Jolinda Necessary, MD;  Location: [facility];  Service: Endoscopy;  Laterality: N/A;   EXCISION RADIAL HEAD Right 11/03/2017   Procedure: RIGHT PROXIMAL RADIUS RADIAL HEAD RESECTION AND JOINT DEBRIDEMENT;  Surgeon: Arvil Birks, MD;  Location: [facility];  Service: Orthopedics;  Laterality: Right;   EXTRACORPOREAL SHOCK WAVE LITHOTRIPSY Right 11/20/2020   Procedure: EXTRACORPOREAL SHOCK WAVE LITHOTRIPSY (ESWL);  Surgeon: Osborn Blaze, MD;  Location: [facility];  Service: Urology;  Laterality: Right;  75 MINS   KNEE ARTHROSCOPY  05/01/2012   Procedure: ARTHROSCOPY KNEE;  Surgeon: Loel Ring, MD;  Location: [facility];  Service: Orthopedics;  Laterality: Left;  debridement and removal of loose body   LEFT ANKLE ARTHROSCOPY W/ DEBRIDEMENT  05/12/2007   LEFT HYDROCELECTOMY  03/29/2005   AND REPAIR LEFT INGUINAL HERNIA W/ MESH   MOHS SURGERY  2002   RIGHT ORBITAL CANCER   NASAL ENDOSCOPY  08/07/2005   RIGHT EPISTAXIS  / POST SEPTOPLASTY  (HX RIGHT ORBITAL CA & S/P RADIATION/ NECROSIS ANTERIOR END OF BOTH INFERIOR TURBINATES)   occuloplastic surgery  2002   PARS PLANA VITRECTOMY  11/06/2004   RIGHT EYE RADIATION RETINOPATHY W/ HEMORRHAGE   PROSTATE ABLATION N/A 06/2022   RADIAL HEAD ARTHROPLASTY Right 06/15/2018   Procedure: RIGHT ELBOW PROXIMAL RADIOULNAR JOINT  DEBRIDEMENT AND ARTHROPLASTY;  Surgeon: Arvil Birks, MD;  Location: [facility] ;  Service: Orthopedics;  Laterality: Right;  BLOCK WITH SEDATION   REPAIR UNDESENDED RIGHT TESTICLE / RIGHT INGUINAL HERNIA  AGE 60   RIGHT ANKLE ARTHROSCOPY W/ EXTENSIVE DEBRIDEMENT  04/05/2008   x2   RIGHT SHOULDER SURGERY  2006   RIGHT SUPRAOMOHYOID NECK DISSECTION   03/08/2003   ZONES 1,2,3;   SUBMANDIBULAR MASS / METASTATIC SQUAMOUS CELL CARCINOMA RIGHT NECK   SAVORY DILATION N/A 02/24/2018   Procedure: SAVORY DILATION;  Surgeon: Jolinda Necessary, MD;  Location: [facility];  Service: Endoscopy;  Laterality: N/A;   SEPTOPLASTY  06/2005   SHOULDER ARTHROSCOPY Left    SHOULDER ARTHROSCOPY W/ SUBACROMIAL DECOMPRESSION AND DISTAL CLAVICLE EXCISION  10/09/2008   AND DEBRIDEMENT OF RIGHT SHOULDER IMPINGEMENT & AC JOINT ARTHRITIS   SPINE SURGERY  2016   l 3 TO l 4 PLATE AND SCREWS   TOTAL THYROIDECTOMY  11/03/2001   PAPILLARY THYROID  CARCINOMA   TRANSTHORACIC ECHOCARDIOGRAM  07/2011   grade I diastolic dysfunction/ ef 55-60%   ULNAR NERVE TRANSPOSITION Right 04/28/2014   Procedure: RIGHT ELBOW ULNA NERVE RELEASE TRANSPOSTION AND MEDIAL EPICONDYLAR DEBRIDEMENT AND REPAIR;  Surgeon: Shellie Dials, MD;  Location: [facility];  Service: Orthopedics;  Laterality: Right;   Patient Active Problem List   Diagnosis Date Noted   Primary osteoarthritis of both knees 09/22/2023   Boil 08/26/2023   Abscess 08/20/2023   Cellulitis 05/12/2023   Bruise of lower lip 04/17/2023   Wheezing 03/31/2023   Injury of medial collateral ligament (MCL) of knee 02/07/2023   Sprain of medial collateral ligament of right knee 02/07/2023   Osteoarthritis of subtalar joint 09/25/2022   Cystoid macular edema of right eye 07/18/2022   Hyperglycemia 07/18/2022   Nonexudative age-related macular degeneration, left eye, early dry stage 07/18/2022   Retinal edema 07/18/2022   Superficial punctate keratitis of  right eye 07/18/2022   Grief at loss of child 05/22/2022   Actinic keratoses 05/22/2022   Pain in joint of left elbow 04/04/2022   Abnormal defecation 02/26/2022   Atrophic gastritis 02/26/2022   Bipolar 1 disorder (HCC) 02/26/2022   BPH (benign prostatic hyperplasia) 02/26/2022   Bronchiolectasis (HCC) 02/26/2022   Disease of thyroid  gland 02/26/2022   Diverticular disease of colon 02/26/2022   Family history of malignant neoplasm of digestive organs 02/26/2022   Hyperlipidemia 02/26/2022   IgG deficiency (HCC) 02/26/2022   Immune deficiency disorder (HCC) 02/26/2022   Irritable bowel syndrome 02/26/2022   Pancreatic cyst 02/26/2022   History of colonic polyps 02/26/2022   Portosystemic shunt, 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/26/2022   Pyloric ulcer 02/26/2022   Scoliosis 02/26/2022   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   Spinal stenosis of lumbar region 02/26/2022   Prostate cancer (HCC) 02/26/2022   Dysuria 10/03/2021   Earache 06/14/2021   Anemia 06/14/2021   History of head and neck cancer 03/28/2021   Swelling of right parotid gland 03/28/2021   Epistaxis, recurrent 06/26/2020   PMR (polymyalgia rheumatica) (HCC) 02/22/2020   Diverticulitis 01/25/2020   Abdominal pain 01/25/2020   Iliac vessel injury 11/30/2019   Stress at home 09/14/2019   LLQ abdominal pain 09/06/2019   IPMN (intraductal papillary mucinous neoplasm) 07/19/2019   History of thyroid  cancer 06/03/2019   Abnormal findings on diagnostic imaging of liver 06/03/2019   Epididymitis 03/16/2019   Difficulty urinating 03/16/2019   Pain in right foot 03/12/2019   Hypothyroidism (acquired) 09/29/2018   Moderate persistent asthma with acute exacerbation 09/29/2018   Prediabetes 09/29/2018   RTI (respiratory tract infection) 09/29/2018   ETD (Eustachian tube dysfunction), bilateral 09/02/2018   Sensorineural hearing loss (SNHL) of both ears 07/31/2018   Bruising 11/17/2017   Encounter  for other orthopedic aftercare 11/14/2017   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   Closed fracture of head of right radius 10/24/2017   Palatal mass 09/16/2017   Sore throat 09/16/2017   Testicular pain, right 08/01/2017   Combined form of age-related cataract, left eye 05/19/2017   Dry eye syndrome of both eyes 05/19/2017  Primary open angle glaucoma (POAG) of left eye, mild stage 05/19/2017   Radiation retinopathy, 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   Secondary glaucoma due to combination mechanisms, right, severe stage 05/19/2017   Fatigue 01/21/2017   Ankle pain, left 01/21/2017   Fall (on) (from) other stairs and steps, initial encounter 11/11/2016   Abrasion of head 11/11/2016   Ascending aortic aneurysm (HCC) 07/23/2016   Erectile dysfunction 07/19/2016   Bronchiectasis (HCC) 04/23/2016   Chronic bronchitis (HCC) 03/11/2016   Cerumen impaction 08/04/2015   Bilateral impacted cerumen 08/04/2015   Cervical disc disorder with radiculopathy of cervical region 06/06/2014   Elevated WBC count 06/06/2014   Iron  deficiency anemia 06/06/2014   Edema 02/21/2014   Tinnitus 02/21/2014   Well adult exam 05/31/2013   Glaucoma 05/31/2013   RML pneumonia 03/31/2013   Hypertension, uncontrolled 02/25/2013   Anxiety disorder 08/08/2010   Chest pain, atypical 02/08/2010   ESOPHAGEAL STRICTURE 11/30/2009   Osteoarthritis 08/07/2009   Malaise and fatigue 05/14/2008   Coronary atherosclerosis 11/04/2007   Lumbago 11/04/2007   THYROIDECTOMY, HX OF 11/04/2007   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    PCP: Genia Kettering, MD  REFERRING PROVIDER: Anastasio Kaska, PA-C  REFERRING DIAG: [phone] (ICD-10-CM) - Right knee pain M25.562 (ICD-10-CM) - Left knee pain R60.0 Bilateral edema of lower extremity  THERAPY DIAG:  Chronic pain of both knees  Other abnormalities of gait and mobility  Muscle weakness (generalized)  Rationale for Evaluation and Treatment:  Rehabilitation  ONSET DATE: over a year ago  SUBJECTIVE:   SUBJECTIVE STATEMENT: Pt got approved for a specific type of knee injection. Had L ankle injection earlier today.   From initial evaluation:  Pt states he has BIL knee pain (L>R) for over a year at this point, first noticed difficulty with floor transfers and then worsened to the point where the majority of transfers bothered knees to some extent. Particularly bothersome with stairs. He denies any issues with swelling or N/T. He does endorse some chronic ankle swelling bilaterally that he is in communication w/ providers about. We also discuss his complex medical history in depth today, particularly in regard to his back for which he states he will likely be receiving an epidural at some point for. He does mention that he received BIL knee injections ~3 days ago with about 50% relief.   PERTINENT HISTORY: anemia, anxiety, bipolar 1, BPH, basal cell carcinoma, other hx of cancer, CVID, depression, GERD, HTN, PVD, vertigo, thoracic aortic aneurysm, hx thyroidectomy, hx lumbar fusion  PAIN:  Are you having pain: no Location/description: BIL knees (R>L), anterior knee  NPRS 0/10  - aggravating factors: stair navigation, kneeling, floor transfers, transfers from lower surfaces  - Easing factors: injections, positional changes    PRECAUTIONS: cancer history, aortic aneurysm (no isometrics)  WEIGHT BEARING RESTRICTIONS: No  FALLS:  Has patient fallen in last 6 months? 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 Does have some limitations due to back issues  PATIENT GOALS: would like to be more active  NEXT MD VISIT: TBD, plans to follow with Dr. Lydia Sams beginning of April    OBJECTIVE:  Note: Objective measures were completed at Evaluation unless otherwise noted.  DIAGNOSTIC FINDINGS:  Recent MRI (09/29/23)  for lumbar spine, no recent LE imaging in chart - pt states he had XR done through [facility], showed arthritis and edema in both knees, states they didn't feel he needed an MRI   PATIENT SURVEYS:  LEFS 38/80 LEFS 5/8: 41/80  POSTURE: fwd head, rounded shoulders  PALPATION: Concordant tenderness anterior knees BIL, along joint line and patellar tendons. R is more tender to the touch than the L although the L is reportedly more symptomatic   LOWER EXTREMITY ROM:      Right eval Left eval R/L  Hip flexion     Hip extension     Hip internal rotation     Hip external rotation     Knee extension full Full (passively, does have mild limitation actively against gravity)    Knee flexion 130 deg * 110 deg * 125/120 (in prone)  (Blank rows = not tested) (Key: WFL = within functional limits not formally assessed, * = concordant pain, s = stiffness/stretching sensation, NT = not tested)  Comments:    LOWER EXTREMITY MMT:    MMT Right eval Left eval Rt/Lt (lb) 5/8  Hip flexion 4 4- 25/31.2  Hip abduction (modified sitting) 4 4 SL at knee 21.5/22.1  Hip internal rotation     Hip external rotation     Knee flexion 4+ 4 17.2/14.7  Knee extension 4+ 4 41.8/32.7  Ankle dorsiflexion 4 4    (Blank rows = not tested) (Key: WFL = within functional limits not formally assessed, * = concordant pain, s = stiffness/stretching sensation, NT = not tested)    FUNCTIONAL TESTS:  5xSTS: 12.7sec no UE support, crepitus BIL, L knee pain  GAIT: Distance walked: within clinic Assistive device utilized: None Level of assistance: Complete Independence Comments: widened BOS, reduced truncal rotation, reduced knee ROM BIL throughout all phases of gait but more prominent on L                                                                                                                                  TREATMENT DATE:    [facility] Adult PT Treatment:                                                DATE: 12/31/23 Therapeutic Exercise: Prone quad stretch with strap x30" Prone hamstring curl 5# 2x10 LAQ 5# 5" hold x10ea Sit<>stand 2x10  Neuromuscular re-ed: Dead bug  x10ea Bridge with GTB at thighs-cues for glute and core engagement    Treatment                            5/8: Blank lines following charge title = not provided on this treatment date.   Manual:  TPDN No  There-ex: Core engagement Supine  leg ext progressed to dead bug Bridge with abduction green tband Sit<>stand green tband at knees  There-Act: Re-evaluation Self Care:  Nuro-Re-ed: Weight shift to heels in standing Tandem balance SLS with foot to catch LOB Gait Training:    [facility] Adult PT Treatment:                                                DATE: 12/04/23 Therapeutic Exercise: UBE L1; 3 min fwd, 3 min bwd Prone quad stretch with strap x30" Prone hamstring curl 5# 2x10 Manual Therapy:  Neuromuscular re-ed:  Therapeutic Activity: Leg press 100# double leg 2x10 Leg press 70# single leg 2x10 Lunge matrix 2x10  Single leg sit to stand eccentric 2x10 Gait:  Modalities:  Self Care:    [facility] Adult PT Treatment:                                                DATE: 12/02/23 Therapeutic Exercise: UBE L1; 3 min fwd, 3 min bwd Therapeutic activity: Review of gym equipment pt can perform outside of PT (how to adjust and how to set up) Leg press 100# double leg 2x10 Leg press 70# single leg 2x10 Hip abductor machine 70# 2x10 Cables 10# donkey kick 2x10 Cables 5-10# hip abd 2x10 Hip adduction machine 85# 2x10    PATIENT EDUCATION:  Education details: rationale for interventions, HEP  Person educated: Patient Education method: Explanation, Demonstration, Tactile cues, Verbal cues Education comprehension: verbalized understanding, returned demonstration, verbal  cues required, tactile cues required, and needs further education     HOME EXERCISE PROGRAM: Access Code: [email or web address] URL: [email or web address]  ASSESSMENT:  CLINICAL IMPRESSION: Pt was challenged by coordination with dead bug exercise, requiring cuing for correct performance. He was cued for glute and core engagement with all tasks. Pt denied increased LBP with exercises, but does have some "popping." Will continue to progress as tolerated.    From Initial evaluation: Patient is a pleasant 68 y.o. gentleman who was seen today for physical therapy evaluation and treatment for bilateral knee pain ongoing over past year, much improved with recent injections. Primarily limited in closed chain activities involving knee flexion such as transfers and stair navigation. On exam he demonstrates concordant limitations in knee mobility and strength, L more affected than R. His 5xSTS time is right at cutoff score for fall risk in majority of populations (12-14sec, varying) and elicits mild knee pain. No adverse events, tolerates exam/HEP well without increase in resting pain. Recommend trial of skilled PT to address aforementioned deficits with aim of improving functional tolerance and reducing pain with typical activities. Pt departs today's session in no acute distress, all voiced concerns/questions addressed appropriately from PT perspective.       OBJECTIVE IMPAIRMENTS: Abnormal gait, decreased activity tolerance, decreased endurance, decreased mobility, difficulty walking, decreased ROM, decreased strength, postural dysfunction, and pain.   ACTIVITY LIMITATIONS: standing, squatting, stairs, transfers, and locomotion level  PARTICIPATION LIMITATIONS: meal prep, cleaning, laundry, and community activity  PERSONAL FACTORS: Age, Time since onset of injury/illness/exacerbation, and 3+ comorbidities: anemia, anxiety, bipolar 1, BPH, basal cell carcinoma, other hx of cancer, CVID, depression, GERD,  HTN, PVD, vertigo, thoracic aortic aneurysm, hx thyroidectomy, hx lumbar fusion are also affecting patient's functional  outcome.   REHAB POTENTIAL: Fair given chronicity and comorbidities  CLINICAL DECISION MAKING: Evolving/moderate complexity  EVALUATION COMPLEXITY: Moderate   GOALS:   SHORT TERM GOALS: Target date: 11/14/2023  Pt will demonstrate appropriate understanding and performance of initially prescribed HEP in order to facilitate improved independence with management of symptoms.  Baseline: HEP established  11/14/23: reports good HEP adherence  Goal status: MET  2. Pt will report at least 25% improvement in overall pain levels over past week in order to facilitate improved tolerance to typical daily activities.   Baseline:  Goal status: achieved  LONG TERM GOALS: Target date: 12/12/2023  Pt will score 50/80 or greater on LEFS in order to demonstrate improved perception of function due to symptoms (MCID 9 pts) Baseline: 38/80, 5/8: 41/80 Goal status: INITIAL  2.  Pt will demonstrate at least 120 degrees of knee flexion AROM bilaterally in order to facilitate improved tolerance to gait/transfers.  Baseline: see ROM chart above Goal status: MET 12/04/23  3.  Pt will report at least 50% decrease in overall pain levels in past week in order to facilitate improved tolerance to basic ADLs/mobility.   Baseline: minimal amount of pain  Goal status: met    4.  Pt will be able to perform 5xSTS in less than or equal to 10 in order to demonstrate reduced fall risk and improved functional independence (MCID 5xSTS = 2.3 sec). Baseline: 12sec no UE support Goal status: MET - 12/04/23: 9 sec no UE support   5. Pt will report/demonstrate ability to navigate at least 13 steps w/o UE support and less than 3 pt increase in pain in order to facilitate improved tolerance to home navigation  Baseline: minimal pain and only using hand rail for safety   Goal status: met 6. MMT gross improvement  by 10%  Goal status: Initial 7. Prepared for transition to gym program  Goal status: Initial   PLAN:  PT FREQUENCY: 1/week  PT DURATION: 6 wks  PLANNED INTERVENTIONS: [id number]- PT Re-evaluation, 97110-Therapeutic exercises, 97530- Therapeutic activity, [id number]- Neuromuscular re-education, 97535- Self Care, 15176- Manual therapy, [phone]- Gait training, [phone]- Aquatic Therapy, Patient/Family education, Balance training, Stair training, Taping, Dry Needling, Joint mobilization, Cryotherapy, and Moist heat  PLAN FOR NEXT SESSION: Review/update HEP PRN. Work on [employer] exercises as appropriate with emphasis on quad activation, knee flexion mobility (especially on L), closed chain stability and functional mechanics. Symptom modification strategies as indicated/appropriate. Mindful of complex medical history including aortic aneurysm, cancer. Interested in using gym equipment  Judie Noun, [place], 12/31/2023 3:16 PM    [facility] [address] [place], [name], [address] Phone: [phone]   Fax:  [phone]

## 2024-01-02 ENCOUNTER — Other Ambulatory Visit (INDEPENDENT_AMBULATORY_CARE_PROVIDER_SITE_OTHER)

## 2024-01-02 DIAGNOSIS — R739 Hyperglycemia, unspecified: Secondary | ICD-10-CM

## 2024-01-02 LAB — HEMOGLOBIN A1C: Hgb A1c MFr Bld: 6.1 % (ref 4.6–6.5)

## 2024-01-06 ENCOUNTER — Encounter (HOSPITAL_BASED_OUTPATIENT_CLINIC_OR_DEPARTMENT_OTHER): Payer: Self-pay | Admitting: Physical Therapy

## 2024-01-06 ENCOUNTER — Encounter (HOSPITAL_COMMUNITY): Payer: Self-pay | Admitting: Licensed Clinical Social Worker

## 2024-01-06 ENCOUNTER — Ambulatory Visit (HOSPITAL_BASED_OUTPATIENT_CLINIC_OR_DEPARTMENT_OTHER): Admitting: Physical Therapy

## 2024-01-06 DIAGNOSIS — M25561 Pain in right knee: Secondary | ICD-10-CM | POA: Diagnosis not present

## 2024-01-06 DIAGNOSIS — R2689 Other abnormalities of gait and mobility: Secondary | ICD-10-CM

## 2024-01-06 DIAGNOSIS — M6281 Muscle weakness (generalized): Secondary | ICD-10-CM

## 2024-01-06 DIAGNOSIS — G8929 Other chronic pain: Secondary | ICD-10-CM | POA: Diagnosis not present

## 2024-01-06 DIAGNOSIS — M25562 Pain in left knee: Secondary | ICD-10-CM | POA: Diagnosis not present

## 2024-01-06 NOTE — Therapy (Signed)
 OUTPATIENT PHYSICAL THERAPY LOWER EXTREMITY TREATMENT   Patient Name: Alex [name] MRN: [id number] DOB:[birth year], 68 y.o., male Today's Date: 01/06/2024  END OF SESSION:  PT End of Session - 01/06/24 1148     Visit Number 13    Number of Visits 17    Date for PT Re-Evaluation 01/31/24    Authorization Type BCBS    PT Start Time 1158    PT Stop Time 1230    PT Time Calculation (min) 32 min    Activity Tolerance Patient tolerated treatment well    Behavior During Therapy WFL for tasks assessed/performed                 Past Medical History:  Diagnosis Date   Anemia    Anxiety    Atypical nevus 04/12/1997   dyplastic-left chest below nipple   Atypical nevus 01/18/2005   slight-mod-mid upper abd, slight-mod-right lateral chest-(WS), slight-mod-mid lower back (punch)   Atypical nevus 05/31/2005   dysplastic-central lowerback (exc), dysplastic- right abdomen (Exc)   Basal cell carcinoma 06/04/2016   back of neck   Bipolar I disorder (HCC)    Bleeding ulcer 2016   BPH (benign prostatic hypertrophy) with urinary obstruction    Cancer (HCC)    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 Cheril Cork, oncology. Receives monthly IVIG 40 grams.   Depression    Epicondylitis    right elbow   GERD (gastroesophageal reflux disease)    Glaucoma BOTH EYES   RIGHT EYE RADIATION DAMAGE   Hearing loss    Bilateral   Hepatic cyst    Several, The lesion of concern in segment 6  of the liver has single large portal vein and hepatic vein branches extending to tt, in a pattern of enhancement which mirrors these vascular structures. 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 9 CM X 4. 3 CM PER NOV 06-14-17  CHEST CTFOLOWED BY DR Zorita Hiss  FOR   Positional vertigo    HX OF WITH SINUS INFECTIONS   Prostate cancer [facility])    Follows w/ Dr. Andy Bannister Polascik @ [facility].   Radial head fracture    Right   Squamous cell carcinoma of skin 06/04/2016   in situ-crown of scalp   Squamous cell carcinoma of skin 04/22/2017   in situ-crown scalp (txpbx)   Thoracic aortic aneurysm (HCC) 06/14/2017   last CT 4.1 CM Mild   Tinnitus    CONSTANT   Ulnar nerve compression    right elbow   Unsteady gait    especially with stairs, depth perception off   Urinary hesitancy    Wears glasses    Past Surgical History:  Procedure Laterality Date   CARDIAC CATHETERIZATION  01-16-2006  DR Andy Bannister WALL   MILD CORONARY ATHEROSCLEROSIS/ MID TO DISTAL LAD 40% STENOSIS/ LVF 50-55%   CARPAL TUNNEL RELEASE Right 11/03/2017   Procedure: RIGHT HAND CARPAL TUNNEL RELEASE;   Surgeon: Arvil Birks, MD;  Location: [facility];  Service: Orthopedics;  Laterality: Right;   CATARACT EXTRACTION Right    COLONSCOPY  2017 LAST DONE   MULTIPLE   CYST EXCISION Right 09/04/2023   Procedure: EXCISION CYST RIGHT SHOULDER;  Surgeon: Oralee Billow, MD;  Location: [facility];  Service: General;  Laterality: Right;  LOCAL & MAC   ENDOSCOPY  LAST 2017   MULTIPLE DONE DILATION DONE ALSO   ESOPHAGOGASTRODUODENOSCOPY (EGD) WITH PROPOFOL  N/A 02/24/2018   Procedure: ESOPHAGOGASTRODUODENOSCOPY (EGD) WITH PROPOFOL ;  Surgeon: Jolinda Necessary, MD;  Location: [facility];  Service: Endoscopy;  Laterality: N/A;   EXCISION RADIAL HEAD Right 11/03/2017   Procedure: RIGHT PROXIMAL RADIUS RADIAL HEAD RESECTION AND JOINT DEBRIDEMENT;  Surgeon: Arvil Birks, MD;  Location: [facility];  Service: Orthopedics;  Laterality: Right;   EXTRACORPOREAL SHOCK WAVE LITHOTRIPSY Right 11/20/2020   Procedure: EXTRACORPOREAL SHOCK WAVE LITHOTRIPSY (ESWL);  Surgeon: Osborn Blaze, MD;  Location: [facility];  Service: Urology;  Laterality: Right;  75 MINS   KNEE ARTHROSCOPY  05/01/2012   Procedure: ARTHROSCOPY KNEE;  Surgeon: Loel Ring, MD;  Location: [facility];  Service: Orthopedics;  Laterality: Left;  debridement and removal of loose body   LEFT ANKLE ARTHROSCOPY W/ DEBRIDEMENT  05/12/2007   LEFT HYDROCELECTOMY  03/29/2005   AND REPAIR LEFT INGUINAL HERNIA W/ MESH   MOHS SURGERY  2002   RIGHT ORBITAL CANCER   NASAL ENDOSCOPY  08/07/2005   RIGHT EPISTAXIS  / POST SEPTOPLASTY  (HX RIGHT ORBITAL CA & S/P RADIATION/ NECROSIS ANTERIOR END OF BOTH INFERIOR TURBINATES)   occuloplastic surgery  2002   PARS PLANA VITRECTOMY  11/06/2004   RIGHT EYE RADIATION RETINOPATHY W/ HEMORRHAGE   PROSTATE ABLATION N/A 06/2022   RADIAL HEAD ARTHROPLASTY Right 06/15/2018   Procedure: RIGHT ELBOW PROXIMAL RADIOULNAR JOINT DEBRIDEMENT AND  ARTHROPLASTY;  Surgeon: Arvil Birks, MD;  Location: [facility];  Service: Orthopedics;  Laterality: Right;  BLOCK WITH SEDATION   REPAIR UNDESENDED RIGHT TESTICLE / RIGHT INGUINAL HERNIA  AGE 65   RIGHT ANKLE ARTHROSCOPY W/ EXTENSIVE DEBRIDEMENT  04/05/2008   x2   RIGHT SHOULDER SURGERY  2006   RIGHT SUPRAOMOHYOID NECK DISSECTION   03/08/2003   ZONES 1,2,3;   SUBMANDIBULAR MASS / METASTATIC SQUAMOUS CELL CARCINOMA RIGHT NECK   SAVORY DILATION N/A 02/24/2018   Procedure: SAVORY DILATION;  Surgeon: Jolinda Necessary, MD;  Location: [facility];  Service: Endoscopy;  Laterality: N/A;  SEPTOPLASTY  06/2005   SHOULDER ARTHROSCOPY Left    SHOULDER ARTHROSCOPY W/ SUBACROMIAL DECOMPRESSION AND DISTAL CLAVICLE EXCISION  10/09/2008   AND DEBRIDEMENT OF RIGHT SHOULDER IMPINGEMENT & AC JOINT ARTHRITIS   SPINE SURGERY  2016   l 3 TO l 4 PLATE AND SCREWS   TOTAL THYROIDECTOMY  11/03/2001   PAPILLARY THYROID  CARCINOMA   TRANSTHORACIC ECHOCARDIOGRAM  07/2011   grade I diastolic dysfunction/ ef 55-60%   ULNAR NERVE TRANSPOSITION Right 04/28/2014   Procedure: RIGHT ELBOW ULNA NERVE RELEASE TRANSPOSTION AND MEDIAL EPICONDYLAR DEBRIDEMENT AND REPAIR;  Surgeon: Shellie Dials, MD;  Location: [facility];  Service: Orthopedics;  Laterality: Right;   Patient Active Problem List   Diagnosis Date Noted   Primary osteoarthritis of both knees 09/22/2023   Boil 08/26/2023   Abscess 08/20/2023   Cellulitis 05/12/2023   Bruise of lower lip 04/17/2023   Wheezing 03/31/2023   Injury of medial collateral ligament (MCL) of knee 02/07/2023   Sprain of medial collateral ligament of right knee 02/07/2023   Osteoarthritis of subtalar joint 09/25/2022   Cystoid macular edema of right eye 07/18/2022   Hyperglycemia 07/18/2022   Nonexudative age-related macular degeneration, left eye, early dry stage 07/18/2022   Retinal edema 07/18/2022   Superficial punctate keratitis of right eye  07/18/2022   Grief at loss of child 05/22/2022   Actinic keratoses 05/22/2022   Pain in joint of left elbow 04/04/2022   Abnormal defecation 02/26/2022   Atrophic gastritis 02/26/2022   Bipolar 1 disorder (HCC) 02/26/2022   BPH (benign prostatic hyperplasia) 02/26/2022   Bronchiolectasis (HCC) 02/26/2022   Disease of thyroid  gland 02/26/2022   Diverticular disease of colon 02/26/2022   Family history of malignant neoplasm of digestive organs 02/26/2022   Hyperlipidemia 02/26/2022   IgG deficiency (HCC) 02/26/2022   Immune deficiency disorder (HCC) 02/26/2022   Irritable bowel syndrome 02/26/2022   Pancreatic cyst 02/26/2022   History of colonic polyps 02/26/2022   Portosystemic shunt, 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/26/2022   Pyloric ulcer 02/26/2022   Scoliosis 02/26/2022   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   Spinal stenosis of lumbar region 02/26/2022   Prostate cancer (HCC) 02/26/2022   Dysuria 10/03/2021   Earache 06/14/2021   Anemia 06/14/2021   History of head and neck cancer 03/28/2021   Swelling of right parotid gland 03/28/2021   Epistaxis, recurrent 06/26/2020   PMR (polymyalgia rheumatica) (HCC) 02/22/2020   Diverticulitis 01/25/2020   Abdominal pain 01/25/2020   Iliac vessel injury 11/30/2019   Stress at home 09/14/2019   LLQ abdominal pain 09/06/2019   IPMN (intraductal papillary mucinous neoplasm) 07/19/2019   History of thyroid  cancer 06/03/2019   Abnormal findings on diagnostic imaging of liver 06/03/2019   Epididymitis 03/16/2019   Difficulty urinating 03/16/2019   Pain in right foot 03/12/2019   Hypothyroidism (acquired) 09/29/2018   Moderate persistent asthma with acute exacerbation 09/29/2018   Prediabetes 09/29/2018   RTI (respiratory tract infection) 09/29/2018   ETD (Eustachian tube dysfunction), bilateral 09/02/2018   Sensorineural hearing loss (SNHL) of both ears 07/31/2018   Bruising 11/17/2017   Encounter for other  orthopedic aftercare 11/14/2017   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   Closed fracture of head of right radius 10/24/2017   Palatal mass 09/16/2017   Sore throat 09/16/2017   Testicular pain, right 08/01/2017   Combined form of age-related cataract, left eye 05/19/2017   Dry eye syndrome of both eyes 05/19/2017   Primary open  angle glaucoma (POAG) of left eye, mild stage 05/19/2017   Radiation retinopathy, 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   Secondary glaucoma due to combination mechanisms, right, severe stage 05/19/2017   Fatigue 01/21/2017   Ankle pain, left 01/21/2017   Fall (on) (from) other stairs and steps, initial encounter 11/11/2016   Abrasion of head 11/11/2016   Ascending aortic aneurysm (HCC) 07/23/2016   Erectile dysfunction 07/19/2016   Bronchiectasis (HCC) 04/23/2016   Chronic bronchitis (HCC) 03/11/2016   Cerumen impaction 08/04/2015   Bilateral impacted cerumen 08/04/2015   Cervical disc disorder with radiculopathy of cervical region 06/06/2014   Elevated WBC count 06/06/2014   Iron  deficiency anemia 06/06/2014   Edema 02/21/2014   Tinnitus 02/21/2014   Well adult exam 05/31/2013   Glaucoma 05/31/2013   RML pneumonia 03/31/2013   Hypertension, uncontrolled 02/25/2013   Anxiety disorder 08/08/2010   Chest pain, atypical 02/08/2010   ESOPHAGEAL STRICTURE 11/30/2009   Osteoarthritis 08/07/2009   Malaise and fatigue 05/14/2008   Coronary atherosclerosis 11/04/2007   Lumbago 11/04/2007   THYROIDECTOMY, HX OF 11/04/2007   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    PCP: Genia Kettering, MD  REFERRING PROVIDER: Anastasio Kaska, PA-C  REFERRING DIAG: [phone] (ICD-10-CM) - Right knee pain M25.562 (ICD-10-CM) - Left knee pain R60.0 Bilateral edema of lower extremity  THERAPY DIAG:  Chronic pain of both knees  Other abnormalities of gait and mobility  Muscle weakness (generalized)  Rationale for Evaluation and Treatment:  Rehabilitation  ONSET DATE: over a year ago  SUBJECTIVE:   SUBJECTIVE STATEMENT: Avoid things that hurt me. Knee strength feeling pretty good and some injections coming up.   From initial evaluation:  Pt states he has BIL knee pain (L>R) for over a year at this point, first noticed difficulty with floor transfers and then worsened to the point where the majority of transfers bothered knees to some extent. Particularly bothersome with stairs. He denies any issues with swelling or N/T. He does endorse some chronic ankle swelling bilaterally that he is in communication w/ providers about. We also discuss his complex medical history in depth today, particularly in regard to his back for which he states he will likely be receiving an epidural at some point for. He does mention that he received BIL knee injections ~3 days ago with about 50% relief.   PERTINENT HISTORY: anemia, anxiety, bipolar 1, BPH, basal cell carcinoma, other hx of cancer, CVID, depression, GERD, HTN, PVD, vertigo, thoracic aortic aneurysm, hx thyroidectomy, hx lumbar fusion  PAIN:  Are you having pain: no Location/description: BIL knees (R>L), anterior knee  NPRS 0/10  - aggravating factors: stair navigation, kneeling, floor transfers, transfers from lower surfaces  - Easing factors: injections, positional changes    PRECAUTIONS: cancer history, aortic aneurysm (no isometrics)  WEIGHT BEARING RESTRICTIONS: No  FALLS:  Has patient fallen in last 6 months? 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 Does have some limitations due to back issues  PATIENT GOALS: would like to be more active  NEXT MD VISIT: TBD, plans to follow with Dr. Lydia Sams beginning of April    OBJECTIVE:  Note: Objective measures were completed at Evaluation unless otherwise noted.  DIAGNOSTIC FINDINGS:  Recent MRI (09/29/23) for lumbar spine,  no recent LE imaging in chart - pt states he had XR done through [facility], showed arthritis and edema in both knees, states they didn't feel he needed an MRI   PATIENT SURVEYS:  LEFS 38/80 LEFS 5/8: 41/80  POSTURE: fwd head, rounded shoulders  PALPATION: Concordant tenderness anterior knees BIL, along joint line and patellar tendons. R is more tender to the touch than the L although the L is reportedly more symptomatic   LOWER EXTREMITY ROM:      Right eval Left eval R/L  Hip flexion     Hip extension     Hip internal rotation     Hip external rotation     Knee extension full Full (passively, does have mild limitation actively against gravity)    Knee flexion 130 deg * 110 deg * 125/120 (in prone)  (Blank rows = not tested) (Key: WFL = within functional limits not formally assessed, * = concordant pain, s = stiffness/stretching sensation, NT = not tested)  Comments:    LOWER EXTREMITY MMT:    MMT Right eval Left eval Rt/Lt (lb) 5/8  Hip flexion 4 4- 25/31.2  Hip abduction (modified sitting) 4 4 SL at knee 21.5/22.1  Hip internal rotation     Hip external rotation     Knee flexion 4+ 4 17.2/14.7  Knee extension 4+ 4 41.8/32.7  Ankle dorsiflexion 4 4    (Blank rows = not tested) (Key: WFL = within functional limits not formally assessed, * = concordant pain, s = stiffness/stretching sensation, NT = not tested)    FUNCTIONAL TESTS:  5xSTS: 12.7sec no UE support, crepitus BIL, L knee pain  GAIT: Distance walked: within clinic Assistive device utilized: None Level of assistance: Complete Independence Comments: widened BOS, reduced truncal rotation, reduced knee ROM BIL throughout all phases of gait but more prominent on L                                                                                                                                  TREATMENT DATE:   Treatment                            5/27: Blank lines following charge title = not provided on this treatment date.   Manual:  TPDN No  There-ex: Dead lift 20lb Step forward, up and lateral with knee drives holding dumbbells TRx: squat, row, lumbar stretch, push ups- also on bar There-Act:  Self Care: Discussed workout post injections Nuro-Re-ed:  Gait Training:    [facility] Adult PT Treatment:                                                DATE: 12/31/23 Therapeutic Exercise: Prone quad stretch with strap x30" Prone  hamstring curl 5# 2x10 LAQ 5# 5" hold x10ea Sit<>stand 2x10  Neuromuscular re-ed: Dead bug  x10ea Bridge with GTB at thighs-cues for glute and core engagement    Treatment                            5/8: Blank lines following charge title = not provided on this treatment date.   Manual:  TPDN No  There-ex: Core engagement Supine leg ext progressed to dead bug Bridge with abduction green tband Sit<>stand green tband at knees  There-Act: Re-evaluation Self Care:  Nuro-Re-ed: Weight shift to heels in standing Tandem balance SLS with foot to catch LOB Gait Training:     PATIENT EDUCATION:  Education details: rationale for interventions, HEP  Person educated: Patient Education method: Explanation, Demonstration, Tactile cues, Verbal cues Education comprehension: verbalized understanding, returned demonstration, verbal cues required, tactile cues required, and needs further education     HOME EXERCISE PROGRAM: Access Code: [email or web address] URL: [email or web address]  ASSESSMENT:  CLINICAL IMPRESSION: Heavy cuing to utilize hip hinge vs lumbar spine for flexion. Pt is doing very well in his progresssion- is going to [place]  and then will return for 1 more visit. He is prepared to d/c from PT and transition to Right [facility] program- provided with handout today. Will benefit from review  of HEP when he returns and then d/c.    From Initial evaluation: Patient is a pleasant 68 y.o. gentleman who was seen today for physical therapy evaluation and treatment for bilateral knee pain ongoing over past year, much improved with recent injections. Primarily limited in closed chain activities involving knee flexion such as transfers and stair navigation. On exam he demonstrates concordant limitations in knee mobility and strength, L more affected than R. His 5xSTS time is right at cutoff score for fall risk in majority of populations (12-14sec, varying) and elicits mild knee pain. No adverse events, tolerates exam/HEP well without increase in resting pain. Recommend trial of skilled PT to address aforementioned deficits with aim of improving functional tolerance and reducing pain with typical activities. Pt departs today's session in no acute distress, all voiced concerns/questions addressed appropriately from PT perspective.       OBJECTIVE IMPAIRMENTS: Abnormal gait, decreased activity tolerance, decreased endurance, decreased mobility, difficulty walking, decreased ROM, decreased strength, postural dysfunction, and pain.   ACTIVITY LIMITATIONS: standing, squatting, stairs, transfers, and locomotion level  PARTICIPATION LIMITATIONS: meal prep, cleaning, laundry, and community activity  PERSONAL FACTORS: Age, Time since onset of injury/illness/exacerbation, and 3+ comorbidities: anemia, anxiety, bipolar 1, BPH, basal cell carcinoma, other hx of cancer, CVID, depression, GERD, HTN, PVD, vertigo, thoracic aortic aneurysm, hx thyroidectomy, hx lumbar fusion are also affecting patient's functional outcome.   REHAB POTENTIAL: Fair given chronicity and comorbidities  CLINICAL DECISION MAKING: Evolving/moderate complexity  EVALUATION COMPLEXITY: Moderate   GOALS:   SHORT TERM GOALS: Target date: 11/14/2023  Pt will demonstrate appropriate understanding and performance of initially prescribed  HEP in order to facilitate improved independence with management of symptoms.  Baseline: HEP established  11/14/23: reports good HEP adherence  Goal status: MET  2. Pt will report at least 25% improvement in overall pain levels over past week in order to facilitate improved tolerance to typical daily activities.   Baseline:  Goal status: achieved  LONG TERM GOALS: Target date: 12/12/2023  Pt will score 50/80 or greater on LEFS in order to demonstrate improved perception of function due to symptoms (MCID 9 pts)  Baseline: 38/80, 5/8: 41/80 Goal status: INITIAL  2.  Pt will demonstrate at least 120 degrees of knee flexion AROM bilaterally in order to facilitate improved tolerance to gait/transfers.  Baseline: see ROM chart above Goal status: MET 12/04/23  3.  Pt will report at least 50% decrease in overall pain levels in past week in order to facilitate improved tolerance to basic ADLs/mobility.   Baseline: minimal amount of pain  Goal status: met    4.  Pt will be able to perform 5xSTS in less than or equal to 10 in order to demonstrate reduced fall risk and improved functional independence (MCID 5xSTS = 2.3 sec). Baseline: 12sec no UE support Goal status: MET - 12/04/23: 9 sec no UE support   5. Pt will report/demonstrate ability to navigate at least 13 steps w/o UE support and less than 3 pt increase in pain in order to facilitate improved tolerance to home navigation  Baseline: minimal pain and only using hand rail for safety   Goal status: met 6. MMT gross improvement by 10%  Goal status: Initial 7. Prepared for transition to gym program  Goal status: Initial   PLAN:  PT FREQUENCY: 1/week  PT DURATION: 6 wks  PLANNED INTERVENTIONS: [id number]- PT Re-evaluation, 97110-Therapeutic exercises, 97530- Therapeutic activity, [id number]- Neuromuscular re-education, 97535- Self Care, 91478- Manual therapy, [phone]- Gait training, [phone]- Aquatic Therapy, Patient/Family education, Balance training,  Stair training, Taping, Dry Needling, Joint mobilization, Cryotherapy, and Moist heat  PLAN FOR NEXT SESSION: Review/update HEP PRN. Work on [employer] exercises as appropriate with emphasis on quad activation, knee flexion mobility (especially on L), closed chain stability and functional mechanics. Symptom modification strategies as indicated/appropriate. Mindful of complex medical history including aortic aneurysm, cancer. Interested in using gym equipment  Kesi Perrow C. Anamika Kueker PT, DPT 01/06/24 1:19 PM    [facility] [address] [place], [name], [address] Phone: [phone]   Fax:  [phone]

## 2024-01-06 NOTE — Progress Notes (Signed)
 Virtual virtual Video Note  I connected with Alex [name] on 12/31/2023 at 2-3pm EST by video-enabled virtual visit. I verified that I am speaking with the correct person using  two identifiers.I discussed the limitations of evaluation and management by telemedicine and the availability of in person appointments. The patient expressed understanding and agreed to proceed.    LOCATION: Patient: Home  Provider: Home office   History of Present Illness:  Pt was referred by Dr. Arfeen for OP therapy for bipolar disorder and anxiety.  Treatment Goal Addressed:  Pt will meet with clinician weekly for therapy to monitor for progress towards goals and address any barriers to success; Reduce depression from average severity level of 6/10 down to a 4/10 in next 6 months by engaging in 1-2 positive coping skills daily as part of developing self-care routine; Reduce average anxiety level from 7/10 down to 5/10 in next 6 months by utilizing 1-2 relaxation skills/grounding skills per day, such as mindful breathing, progressive muscle relaxation, positive visualizations.  Progress towards Treatment Goal: Progressing   Observations/Objective: Patient presented for today's session on time and was alert, oriented x5, with no evidence or self-report of SI/HI or A/V H.  Patient reported ongoing compliance with medication and denied any use of alcohol  or illicit substances. Clinician inquired about patient's current emotional ratings, as well as any significant changes in thoughts, feelings or behavior since previous session. Patient reported scores of  8/10 for depression, 8/10 for anxiety, 3/10 for anger/irritability. [name] and pt explored his emotional ratings, and discussed coping skills for his stressors. Pt identifies stressors and  allowed pt to explore and express thoughts and feelings associated with recent life situations and external stressors. 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 "Im still depressed." [name] completed a safety plan with pt. Again, cln provided psychoeducation on the cycle of depression.     Assessment and plan: Counselor will continue to meet with patient to address treatment plan goals. Patient will continue to follow recommendations of providers and imnd implement skills learned in session, and practice between sessions. Diagnosis: Bipolar 1 disorder.    Collaboration of care: Other:   Continue working with providers    Patient/Guardian was advised Release of Information must be obtained prior to any record release in order to collaborate their care with an outside provider. Patient/Guardian was advised if they have not already done so to contact the registration department to sign all necessary forms in order for us  to release information regarding their care.    Consent: Patient/Guardian gives verbal consent for treatment and assignment of benefits for services provided during this visit. Patient/Guardian expressed understanding and agreed to proceed.     Follow Up Instructions:  I discussed the assessment and treatment plan with the patient. The patient was provided an opportunity to ask questions and all were answered. The patient agreed with the plan and demonstrated an understanding of the instructions.   The patient was advised to call back or seek an in-person evaluation if the symptoms worsen or if the condition fails to improve as anticipated.  I provided 60 minutes of non-face-to-face time during this encounter.   Lativia Velie S, LCAS 12/31/23

## 2024-01-07 ENCOUNTER — Ambulatory Visit (INDEPENDENT_AMBULATORY_CARE_PROVIDER_SITE_OTHER): Admitting: Licensed Clinical Social Worker

## 2024-01-07 ENCOUNTER — Encounter (HOSPITAL_COMMUNITY): Payer: Self-pay | Admitting: Licensed Clinical Social Worker

## 2024-01-07 DIAGNOSIS — F319 Bipolar disorder, unspecified: Secondary | ICD-10-CM

## 2024-01-07 NOTE — Progress Notes (Unsigned)
 Virtual virtual Video Note  I connected with Alex [name] on 01/07/2024 at 2-3pm EST by video-enabled virtual visit. I verified that I am speaking with the correct person using  two identifiers.I discussed the limitations of evaluation and management by telemedicine and the availability of in person appointments. The patient expressed understanding and agreed to proceed.    LOCATION: Patient: Home  Provider: Home office   History of Present Illness:  Pt was referred by Dr. Arfeen for OP therapy for bipolar disorder and anxiety.  Treatment Goal Addressed:  Pt will meet with clinician weekly for therapy to monitor for progress towards goals and address any barriers to success; Reduce depression from average severity level of 6/10 down to a 4/10 in next 6 months by engaging in 1-2 positive coping skills daily as part of developing self-care routine; Reduce average anxiety level from 7/10 down to 5/10 in next 6 months by utilizing 1-2 relaxation skills/grounding skills per day, such as mindful breathing, progressive muscle relaxation, positive visualizations.  Progress towards Treatment Goal: Progressing   Observations/Objective: Patient presented for today's session on time and was alert, oriented x5, with no evidence or self-report of SI/HI or A/V H.  Patient reported ongoing compliance with medication and denied any use of alcohol  or illicit substances. Clinician inquired about patient's current emotional ratings, as well as any significant changes in thoughts, feelings or behavior since previous session. Patient reported scores of  8/10 for depression, 8/10 for anxiety, 3/10 for anger/irritability. [name] and pt explored his emotional ratings, and discussed coping skills for his stressors. Pt identifies stressors and  allowed pt to explore and express thoughts and feelings associated with recent life situations and external stressors. 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 "Im still depressed." [name] completed a safety plan with pt. Again, cln provided psychoeducation on the cycle of depression.     Assessment and plan: Counselor will continue to meet with patient to address treatment plan goals. Patient will continue to follow recommendations of providers and imnd implement skills learned in session, and practice between sessions. Diagnosis: Bipolar 1 disorder.    Collaboration of care: Other:   Continue working with providers    Patient/Guardian was advised Release of Information must be obtained prior to any record release in order to collaborate their care with an outside provider. Patient/Guardian was advised if they have not already done so to contact the registration department to sign all necessary forms in order for us  to release information regarding their care.    Consent: Patient/Guardian gives verbal consent for treatment and assignment of benefits for services provided during this visit. Patient/Guardian expressed understanding and agreed to proceed.     Follow Up Instructions:  I discussed the assessment and treatment plan with the patient. The patient was provided an opportunity to ask questions and all were answered. The patient agreed with the plan and demonstrated an understanding of the instructions.   The patient was advised to call back or seek an in-person evaluation if the symptoms worsen or if the condition fails to improve as anticipated.  I provided 60 minutes of non-face-to-face time during this encounter.   Malasha Kleppe S, LCAS 01/07/24

## 2024-01-13 ENCOUNTER — Encounter (HOSPITAL_COMMUNITY): Payer: Self-pay | Admitting: Licensed Clinical Social Worker

## 2024-01-13 ENCOUNTER — Telehealth (HOSPITAL_COMMUNITY): Admitting: Psychiatry

## 2024-01-13 ENCOUNTER — Ambulatory Visit (INDEPENDENT_AMBULATORY_CARE_PROVIDER_SITE_OTHER): Admitting: Licensed Clinical Social Worker

## 2024-01-13 DIAGNOSIS — F319 Bipolar disorder, unspecified: Secondary | ICD-10-CM | POA: Diagnosis not present

## 2024-01-13 NOTE — Progress Notes (Signed)
 Virtual virtual Video Note  I connected with Alex [name] on 01/13/2024 at 4-5pm EST by video-enabled virtual visit. I verified that I am speaking with the correct person using  two identifiers.I discussed the limitations of evaluation and management by telemedicine and the availability of in person appointments. The patient expressed understanding and agreed to proceed.    LOCATION: Patient: Home  Provider: Home office   History of Present Illness:  Pt was referred by Dr. Arfeen for OP therapy for bipolar disorder and anxiety.  Treatment Goal Addressed:  Pt will meet with clinician weekly for therapy to monitor for progress towards goals and address any barriers to success; Reduce depression from average severity level of 6/10 down to a 4/10 in next 6 months by engaging in 1-2 positive coping skills daily as part of developing self-care routine; Reduce average anxiety level from 7/10 down to 5/10 in next 6 months by utilizing 1-2 relaxation skills/grounding skills per day, such as mindful breathing, progressive muscle relaxation, positive visualizations.  Progress towards Treatment Goal: Progressing   Observations/Objective: Patient presented for today's session on time and was alert, oriented x5, with no evidence or self-report of SI/HI or A/V H.  Patient reported ongoing compliance with medication and denied any use of alcohol  or illicit substances. Clinician inquired about patient's current emotional ratings, as well as any significant changes in thoughts, feelings or behavior since previous session. Patient reported scores of  8/10 for depression, 8/10 for anxiety, 3/10 for anger/irritability. [name] and pt explored his emotional ratings, and discussed coping skills for his stressors. Pt identifies stressors and  allowed pt to explore and express thoughts and feelings associated with recent life situations and external stressors. 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 Clinician used CBT to assist pt in processing his thoughts, feelings, and behaviors    Assessment and plan: Counselor will continue to meet with patient to address treatment plan goals. Patient will continue to follow recommendations of providers and imnd implement skills learned in session, and practice between sessions. Diagnosis: Bipolar 1 disorder.    Collaboration of care: Other:   Continue working with providers    Patient/Guardian was advised Release of Information must be obtained prior to any record release in order to collaborate their care with an outside provider. Patient/Guardian was advised if they have not already done so to contact the registration department to sign all necessary forms in order for us  to release information regarding their care.    Consent: Patient/Guardian gives verbal consent for treatment and assignment of benefits for services provided during this visit. Patient/Guardian expressed understanding and agreed to proceed.     Follow Up Instructions:  I discussed the assessment and treatment plan with the patient. The patient was provided an opportunity to ask questions and all were answered. The patient agreed with the plan and demonstrated an understanding of the instructions.   The patient was advised to call back or seek an in-person evaluation if the symptoms worsen or if the condition fails to improve as anticipated.  I provided 60 minutes of non-face-to-face time during this encounter.   Thoams Siefert S, LCAS 01/13/24

## 2024-01-14 ENCOUNTER — Encounter (HOSPITAL_COMMUNITY): Payer: Self-pay | Admitting: Psychiatry

## 2024-01-14 ENCOUNTER — Telehealth (HOSPITAL_BASED_OUTPATIENT_CLINIC_OR_DEPARTMENT_OTHER): Admitting: Psychiatry

## 2024-01-14 ENCOUNTER — Ambulatory Visit (HOSPITAL_COMMUNITY): Admitting: Licensed Clinical Social Worker

## 2024-01-14 ENCOUNTER — Encounter: Attending: Internal Medicine | Admitting: Dietician

## 2024-01-14 ENCOUNTER — Encounter: Payer: Self-pay | Admitting: Dietician

## 2024-01-14 VITALS — Wt 183.4 lb

## 2024-01-14 DIAGNOSIS — F319 Bipolar disorder, unspecified: Secondary | ICD-10-CM | POA: Diagnosis not present

## 2024-01-14 DIAGNOSIS — R7303 Prediabetes: Secondary | ICD-10-CM | POA: Diagnosis not present

## 2024-01-14 DIAGNOSIS — F411 Generalized anxiety disorder: Secondary | ICD-10-CM

## 2024-01-14 DIAGNOSIS — F5101 Primary insomnia: Secondary | ICD-10-CM | POA: Diagnosis not present

## 2024-01-14 MED ORDER — LAMOTRIGINE ER 300 MG PO TB24
1.0000 | ORAL_TABLET | ORAL | 0 refills | Status: DC
Start: 2024-01-14 — End: 2024-04-14

## 2024-01-14 MED ORDER — GABAPENTIN 100 MG PO CAPS: 100.0000 mg | ORAL_CAPSULE | Freq: Every day | ORAL | 2 refills | Status: DC

## 2024-01-14 MED ORDER — ZOLPIDEM TARTRATE 10 MG PO TABS: ORAL_TABLET | ORAL | 0 refills | Status: DC

## 2024-01-14 NOTE — Progress Notes (Signed)
 Medical Nutrition Therapy  Appointment Start time:  [phone]  Appointment End time:  1500  Primary concerns today: overall health to improve conditions   Referral diagnosis: prediabetes Preferred learning style: no preference indicated Learning readiness: ready   NUTRITION ASSESSMENT   Anthropometrics   Wt: 183.4 lb  Clinical Medical Hx: anemia, anxiety, arthritis, cancer, cataracts, depression, GERD, glaucoma, HLD, HTN, nerve/muscle disease, prediabetes Medications: reviewed, levothyroxine  Labs: 01/02/24: A1c 6.1% Notable Signs/Symptoms: none reported Food Allergies: none  Lifestyle & Dietary Hx  Pt reports he is 18 months out from prostate cancer surgery. Pt reports he also has macular edema. Pt reports he has ascending aortic aneurism. Pt reports he had neck and thyroid  cancer in 2002. Pt reports he wants to work on his nutrition to best benefit his health.   Pt reports he and his wife eat at home more often than eating out. Pt reports he tries not to eat a lot of red meat. Pt reports having ground beef around 7 times per month.  Pt reports he is [place] and enjoys pasta dishes. Pt reports he enjoys vegetables such as tomatoes, green beans, broccoli, asparagus, and salads.   Pt reports he has been doing physical therapy and has a Research scientist (physical sciences) at [employer].    Estimated daily fluid intake: 24 oz Supplements: coq-10, omega 3, MVI Sleep: 8 hours, sleeps at 11pm/12am Stress / self-care: moderate-to-high  Current average weekly physical activity: physical therapy  24-Hr Dietary Recall First Meal: 9am: blueberries and special K and 2% milk  Snack: none Second Meal: pack of crackers and glass of milk Snack: none Third Meal: 6pm: pasta and chicken OR hamburger/hotdog OR spaghetti OR stroganoff  Snack: cookie Beverages: 1 c cup coffee with cream and sugar, milk, water   NUTRITION DIAGNOSIS  NB-1.1 Food and nutrition-related knowledge deficit As related to lack of prior  education by a registered dietitian.  As evidenced by pt report.   NUTRITION INTERVENTION  Nutrition education (E-1) on the following topics:   MyPlate Fruits & Vegetables: Aim to fill half your plate with a variety of fruits and vegetables. They are rich in vitamins, minerals, and fiber, and can help reduce the risk of chronic diseases. Choose a colorful assortment of fruits and vegetables to ensure you get a wide range of nutrients. Grains and Starches: Make at least half of your grain choices whole grains, such as brown rice, whole wheat bread, and oats. Whole grains provide fiber, which aids in digestion and healthy cholesterol levels. Aim for whole forms of starchy vegetables such as potatoes, sweet potatoes, beans, peas, and corn, which are fiber rich and provide many vitamins and minerals.  Protein: Incorporate lean sources of protein, such as poultry, fish, beans, nuts, and seeds, into your meals. Protein is essential for building and repairing tissues, staying full, balancing blood sugar, as well as supporting immune function. Dairy: Include low-fat or fat-free dairy products like milk, yogurt, and cheese in your diet. Dairy foods are excellent sources of calcium  and vitamin D , which are crucial for bone health.   Health Health/LDL Lowering Nutrition Therapy Soluble fiber and impact on lowering cholesterol: Sources: Oats, barley, beans, lentils, fruits (apples, oranges), vegetables (carrots, broccoli), and flaxseeds. Benefits: Soluble fiber reduces the absorption of cholesterol into your bloodstream. Choose Healthy Unsaturated Fats and Omega 3's. Sources: Olive oil, canola oil, avocados, nuts, and Fatty fish (salmon, trout), walnuts, flaxseeds, and chia seeds. Benefits: Helps raise HDL cholesterol and lower LDL cholesterol. Omega-3s help reduce triglycerides and raise  HDL cholesterol. Limit Saturated Fats: Sources: Red meat, full-fat dairy products, butter, and coconut oil. Benefits: Reducing  intake helps lower LDL cholesterol levels. Eat Plenty of Fruits and Vegetables. Benefits: High in fiber and antioxidants, which help lower LDL cholesterol. Eat Whole Grains. Sources: Brown rice, whole wheat bread, quinoa, and whole grain pasta. Benefits: Whole grains contain more fiber and nutrients than refined grains. Regular Physical Activity: Aim for at least 30 minutes of moderate-intensity exercise most days of the week.   Handouts Provided Include  Plate Method Peanut butter label reading  Learning Style & Readiness for Change Teaching method utilized: Visual & Auditory  Demonstrated degree of understanding via: Teach Back  Barriers to learning/adherence to lifestyle change: none  Goals Established by Pt  Goal 1: drink 1 of your 20 oz bottles of water daily (we will work up).   Goal 2: implement a healthier midday snack (fruit and nuts, apple and natural peanut butter, fruit and cottage cheese, etc).    MONITORING & EVALUATION Dietary intake, weekly physical activity, and follow up in 2 months.  Next Steps  Patient is to call for questions.

## 2024-01-14 NOTE — Patient Instructions (Signed)
 Goals Established by Pt  Goal 1: drink 1 of your 20 oz bottles of water daily (we will work up).   Goal 2: implement a healthier midday snack (fruit and nuts, apple and natural peanut butter, fruit and cottage cheese, etc).

## 2024-01-14 NOTE — Progress Notes (Signed)
 [facility] MD Virtual Progress Note   Patient Location: Home Provider Location: Home Office  I connect with patient by video and verified that I am speaking with correct person by using two identifiers. I discussed the limitations of evaluation and management by telemedicine and the availability of in person appointments. I also discussed with the patient that there may be a patient responsible charge related to this service. The patient expressed understanding and agreed to proceed.  Alex [name] [id number] 68 y.o.  01/14/2024 10:18 AM  History of Present Illness:  Patient is evaluated by video session.  He reported lately noticed having REM sleep behavior which she described agitated, restless at night and kicking his wife.  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  Patient is now want to see a neurologist because he now if he has REM behavior which is a strong predictor of Parkinson's and cognitive disorder.  Otherwise he is doing well.  Excited about upcoming trip to [place]  where he will stay for 10 days with the grandkids while his daughter and son-in-law are going [place] about their anniversary.  Patient also happy finally injection is approved by insurance so he is going to get his the injection in his knee joint very soon.  He denies any irritability, mania, anger, agitation.  He denies any suicidal thoughts or homicidal thoughts.  He is in therapy with El[facility].  He has chronic pain but hoping to get injection in his knee.  He also going to start dietitian because he feel he needed due to multiple health issues so can he have a better eating habits.  His appetite is okay.  We started him on gabapentin  to take to help his anxiety.  He only took 1 pill which make him sleepy but he like to take at nighttime only which can help his sleep and anxiety.  He is consistent with Ambien  5 mg only and Lamictal .  He has no rash or any itching.  Recently  had a blood work and hemoglobin A1c 6.1.  Past Psychiatric History: H/O depression, mood swing, anger and Inpatient at [facility] due to suicidal thoughts but no attempt.  No h/o psychosis but h/o poor impulse control.  Tried Klonopin  but did not work.  Took Seroquel, Elavil , Trazodone, Doxepin, melatonin, Cymbalta, BuSpar  and Remeron  (increase depression)     Outpatient Encounter Medications as of 01/14/2024  Medication Sig   acetaminophen  (TYLENOL ) 500 MG tablet Take by mouth.   albuterol  (VENTOLIN  HFA) 108 (90 Base) MCG/ACT inhaler TAKE 2 PUFFS BY MOUTH EVERY 6 HOURS AS NEEDED FOR WHEEZE OR SHORTNESS OF BREATH (Patient not taking: Reported on 10/23/2023)   amLODipine  (NORVASC ) 5 MG tablet Take 1 tablet (5 mg total) by mouth daily.   aspirin  EC 81 MG tablet Take 1 tablet by mouth daily.   atorvastatin  (LIPITOR) 40 MG tablet TAKE 1 TABLET BY MOUTH EVERY DAY   AXIRON  30 MG/ACT SOLN Place 2 Act onto the skin daily.   brimonidine (ALPHAGAN) 0.2 % ophthalmic solution SMARTSIG:In Eye(s)   Coenzyme Q10 (CO Q 10 PO) Take 1 capsule by mouth daily.    EPINEPHrine  0.3 mg/0.3 mL IJ SOAJ injection Inject 0.3 mg into the muscle as needed. (Patient not taking: Reported on 10/23/2023)   erythromycin ophthalmic ointment Place into the right eye at bedtime.   furosemide  (LASIX ) 20 MG tablet TAKE 1 TO 2 TABLETS BY MOUTH DAILY AS NEEDED (Patient not taking: Reported on 10/23/2023)  gabapentin  (NEURONTIN ) 100 MG capsule Take 1 capsule (100 mg total) by mouth 2 (two) times daily.   Immune Globulin, Human, 4 GM/20ML SOLN Inject into the skin once a week. wednesday   LamoTRIgine  300 MG TB24 24 hour tablet Take 1 tablet (300 mg total) by mouth every morning.   latanoprost  (XALATAN ) 0.005 % ophthalmic solution 1 DROP EACH EYE AT NIGHT   levothyroxine  (SYNTHROID ) 150 MCG tablet TAKE 1 TABLET BY MOUTH DAILY BEFORE BREAKFAST.   meloxicam  (MOBIC ) 15 MG tablet TAKE 1 TABLET BY MOUTH EVERY DAY AS NEEDED   Multiple Vitamin  (MULTI-VITAMINS) TABS Take by mouth.   OMEGA-3 KRILL OIL PO Take 1 capsule by mouth daily.    omeprazole (PRILOSEC) 20 MG capsule Take 20 mg by mouth every evening.   sildenafil  (REVATIO ) 20 MG tablet Take 1 tablet (20 mg total) by mouth as needed.   sulfamethoxazole -trimethoprim  (BACTRIM  DS) 800-160 MG tablet Take 1 tablet by mouth 2 (two) times daily.   tadalafil  (CIALIS ) 5 MG tablet Take 5 mg by mouth.   tamsulosin  (FLOMAX ) 0.4 MG CAPS capsule TAKE 1 CAPSULE BY MOUTH EVERY DAY (Patient taking differently: as needed.)   telmisartan  (MICARDIS ) 80 MG tablet Take 1 tablet (80 mg total) by mouth daily.   timolol  (TIMOPTIC ) 0.5 % ophthalmic solution timolol  maleate 0.5 % eye drops  INSTILL 1 DROP INTO RIGHT EYE TWICE A DAY   traMADol  (ULTRAM ) 50 MG tablet Take 1 tablet (50 mg total) by mouth every 6 (six) hours as needed for moderate pain (pain score 4-6).   zolpidem  (AMBIEN ) 10 MG tablet TAKE 1/2 (ONE-HALF) TABLET BY MOUTH AT BEDTIME AND  TAKE  THE  OTHER  HALF  IF  NEEDED   No facility-administered encounter medications on file as of 01/14/2024.    Recent Results (from the past 2160 hours)  Hemoglobin A1c     Status: None   Collection Time: 01/02/24  1:46 PM  Result Value Ref Range   Hgb A1c MFr Bld 6.1 4.6 - 6.5 %    Comment: Glycemic Control Guidelines for People with Diabetes:Non Diabetic:  <6%Goal of Therapy: <7%Additional Action Suggested:  >8%      Psychiatric Specialty Exam: Physical Exam  Review of Systems  There were no vitals taken for this visit.There is no height or weight on file to calculate BMI.  General Appearance: Casual  Eye Contact:  Fair  Speech:  Slow  Volume:  Decreased  Mood:  Anxious  Affect:  Appropriate  Thought Process:  Goal Directed  Orientation:  Full (Time, Place, and Person)  Thought Content:  Rumination  Suicidal Thoughts:  No  Homicidal Thoughts:  No  Memory:  Immediate;   Good Recent;   Good Remote;   Fair  Judgement:  Intact  Insight:   Present  Psychomotor Activity:  Decreased  Concentration:  Concentration: Fair and Attention Span: Fair  Recall:  Good  Fund of Knowledge:  Good  Language:  Good  Akathisia:  No  Handed:  Right  AIMS (if indicated):     Assets:  Communication Skills Desire for Improvement Housing Transportation  ADL's:  Intact  Cognition:  WNL  Sleep:  poor sleep, sometimes agitated and dreams       11/12/2023    2:08 PM 08/20/2023   10:33 AM 04/30/2023   11:22 AM 03/31/2023   10:35 AM 11/20/2022    3:02 PM  Depression screen PHQ 2/9  Decreased Interest 1 0 2 0 0  Down, Depressed,  Hopeless 1 0 2 1 0  PHQ - 2 Score 2 0 4 1 0  Altered sleeping 1  2 1    Tired, decreased energy 1  3 2    Change in appetite 0  0 1   Feeling bad or failure about yourself  1  3 2    Trouble concentrating 0  0 1   Moving slowly or fidgety/restless 0  1 2   Suicidal thoughts 0  0 1   PHQ-9 Score 5  13 11    Difficult doing work/chores Not difficult at all  Somewhat difficult Somewhat difficult     Assessment/Plan: GAD (generalized anxiety disorder) - Plan: gabapentin  (NEURONTIN ) 100 MG capsule, LamoTRIgine  300 MG TB24 24 hour tablet, zolpidem  (AMBIEN ) 10 MG tablet  Bipolar I disorder (HCC) - Plan: LamoTRIgine  300 MG TB24 24 hour tablet  Primary insomnia - Plan: zolpidem  (AMBIEN ) 10 MG tablet  Discussed parasomnia and explained need to see neurology to establish diagnosis of REM behavior.  Patient agreed to The referral.  He is going to start the injection for his pain and also had appointment to see a dietitian because he like to get into healthy habits.  Patient has multiple health issues including coronary atherosclerosis, hypertension, aortic aneurysm, diverticulitis disease of colon, hypothyroidism, sensorineural hearing loss, osteoarthritis, scoliosis, prostate hyperplasia/prostate cancer, tinnitus, chronic fatigue reviewed current medication.  Discussed polypharmacy.  Recommend to take the gabapentin  only at bedtime  since it is making him sleepy.  We will refer him to neurology for further workup.  Continue Lamictal  300 mg daily and Ambien  5 mg at bedtime.  I also discussed that it need to be rule out if Ambien  causing sleepwalking but patient is taking a med for a while and noticed REM behavior started recently.  Follow-up in 3 months.  Recommend to call us  back with any question or any concern.    Follow Up Instructions:     I discussed the assessment and treatment plan with the patient. The patient was provided an opportunity to ask questions and all were answered. The patient agreed with the plan and demonstrated an understanding of the instructions.   The patient was advised to call back or seek an in-person evaluation if the symptoms worsen or if the condition fails to improve as anticipated.    Collaboration of Care: Other provider involved in patient's care AEB notes are available in epic to review  Patient/Guardian was advised Release of Information must be obtained prior to any record release in order to collaborate their care with an outside provider. Patient/Guardian was advised if they have not already done so to contact the registration department to sign all necessary forms in order for us  to release information regarding their care.   Consent: Patient/Guardian gives verbal consent for treatment and assignment of benefits for services provided during this visit. Patient/Guardian expressed understanding and agreed to proceed.     Total encounter time 27 minutes which includes face-to-face time, chart reviewed, care coordination, order entry and documentation during this encounter.   Note: This document was prepared by [employer] voice dictation technology and any errors that results from this process are unintentional.    Arturo Late, MD 01/14/2024

## 2024-01-16 ENCOUNTER — Encounter (HOSPITAL_BASED_OUTPATIENT_CLINIC_OR_DEPARTMENT_OTHER)

## 2024-01-16 DIAGNOSIS — T66XXXS Radiation sickness, unspecified, sequela: Secondary | ICD-10-CM | POA: Diagnosis not present

## 2024-01-16 DIAGNOSIS — H353121 Nonexudative age-related macular degeneration, left eye, early dry stage: Secondary | ICD-10-CM | POA: Diagnosis not present

## 2024-01-16 DIAGNOSIS — H3589 Other specified retinal disorders: Secondary | ICD-10-CM | POA: Diagnosis not present

## 2024-01-20 ENCOUNTER — Encounter (HOSPITAL_BASED_OUTPATIENT_CLINIC_OR_DEPARTMENT_OTHER)

## 2024-01-27 ENCOUNTER — Encounter (HOSPITAL_BASED_OUTPATIENT_CLINIC_OR_DEPARTMENT_OTHER)

## 2024-01-28 ENCOUNTER — Ambulatory Visit (INDEPENDENT_AMBULATORY_CARE_PROVIDER_SITE_OTHER): Admitting: Licensed Clinical Social Worker

## 2024-01-28 DIAGNOSIS — Z8546 Personal history of malignant neoplasm of prostate: Secondary | ICD-10-CM | POA: Diagnosis not present

## 2024-01-28 DIAGNOSIS — F319 Bipolar disorder, unspecified: Secondary | ICD-10-CM

## 2024-01-28 DIAGNOSIS — Z08 Encounter for follow-up examination after completed treatment for malignant neoplasm: Secondary | ICD-10-CM | POA: Diagnosis not present

## 2024-02-03 ENCOUNTER — Encounter (HOSPITAL_BASED_OUTPATIENT_CLINIC_OR_DEPARTMENT_OTHER)

## 2024-02-03 ENCOUNTER — Encounter (HOSPITAL_COMMUNITY): Payer: Self-pay | Admitting: Licensed Clinical Social Worker

## 2024-02-03 NOTE — Progress Notes (Signed)
 Virtual virtual Video Note  I connected with Alex [name] on 01/28/2024 at 3-4pm EST by video-enabled virtual visit. I verified that I am speaking with the correct person using  two identifiers.I discussed the limitations of evaluation and management by telemedicine and the availability of in person appointments. The patient expressed understanding and agreed to proceed.    LOCATION: Patient: Home  Provider: Home office   History of Present Illness:  Pt was referred by Dr. Arfeen for OP therapy for bipolar disorder and anxiety.  Treatment Goal Addressed:  Pt will meet with clinician weekly for therapy to monitor for progress towards goals and address any barriers to success; Reduce depression from average severity level of 6/10 down to a 4/10 in next 6 months by engaging in 1-2 positive coping skills daily as part of developing self-care routine; Reduce average anxiety level from 7/10 down to 5/10 in next 6 months by utilizing 1-2 relaxation skills/grounding skills per day, such as mindful breathing, progressive muscle relaxation, positive visualizations.  Progress towards Treatment Goal: Progressing   Observations/Objective: Patient presented for today's session on time and was alert, oriented x5, with no evidence or self-report of SI/HI or A/V H.  Patient reported ongoing compliance with medication and denied any use of alcohol  or illicit substances. Clinician inquired about patient's current emotional ratings, as well as any significant changes in thoughts, feelings or behavior since previous session. Patient reported scores of  8/10 for depression, 8/10 for anxiety, 3/10 for anger/irritability. [name] and pt explored his emotional ratings, and discussed coping skills for his stressors. Pt identifies stressors and  allowed pt to explore and express thoughts and feelings associated with recent life situations and external stressors. [name] and pt reviewed his continued stressors individually: marital  issues, unfair division of martial finances, son's mental health, physical health, future, pt's sexual health after prostate cancer, lack of marital communication, family dysfunction. We just returned from 10 days in [place]  babysitting  grandchildren. [name] assisted pt improving emotional regulation skills, and the development of personal coping strategies that target solving current stressors.      Assessment and plan: Counselor will continue to meet with patient to address treatment plan goals. Patient will continue to follow recommendations of providers and imnd implement skills learned in session, and practice between sessions. Diagnosis: Bipolar 1 disorder.    Collaboration of care: Other:   Continue working with providers    Patient/Guardian was advised Release of Information must be obtained prior to any record release in order to collaborate their care with an outside provider. Patient/Guardian was advised if they have not already done so to contact the registration department to sign all necessary forms in order for us  to release information regarding their care.    Consent: Patient/Guardian gives verbal consent for treatment and assignment of benefits for services provided during this visit. Patient/Guardian expressed understanding and agreed to proceed.     Follow Up Instructions:  I discussed the assessment and treatment plan with the patient. The patient was provided an opportunity to ask questions and all were answered. The patient agreed with the plan and demonstrated an understanding of the instructions.   The patient was advised to call back or seek an in-person evaluation if the symptoms worsen or if the condition fails to improve as anticipated.  I provided 60 minutes of non-face-to-face time during this encounter.   Dresden Ament S, LCAS 01/28/24

## 2024-02-04 ENCOUNTER — Ambulatory Visit (INDEPENDENT_AMBULATORY_CARE_PROVIDER_SITE_OTHER): Admitting: Licensed Clinical Social Worker

## 2024-02-04 DIAGNOSIS — F319 Bipolar disorder, unspecified: Secondary | ICD-10-CM | POA: Diagnosis not present

## 2024-02-05 ENCOUNTER — Other Ambulatory Visit: Payer: Self-pay | Admitting: *Deleted

## 2024-02-05 ENCOUNTER — Encounter: Payer: Self-pay | Admitting: *Deleted

## 2024-02-05 DIAGNOSIS — D508 Other iron deficiency anemias: Secondary | ICD-10-CM

## 2024-02-05 DIAGNOSIS — C61 Malignant neoplasm of prostate: Secondary | ICD-10-CM

## 2024-02-05 DIAGNOSIS — D849 Immunodeficiency, unspecified: Secondary | ICD-10-CM

## 2024-02-05 DIAGNOSIS — D500 Iron deficiency anemia secondary to blood loss (chronic): Secondary | ICD-10-CM

## 2024-02-05 DIAGNOSIS — D803 Selective deficiency of immunoglobulin G [IgG] subclasses: Secondary | ICD-10-CM

## 2024-02-07 ENCOUNTER — Encounter (HOSPITAL_COMMUNITY): Payer: Self-pay | Admitting: Licensed Clinical Social Worker

## 2024-02-07 NOTE — Progress Notes (Signed)
 Virtual virtual Video Note  I connected with Alex [name] on 02/04/2024 at 2-3pm EST by video-enabled virtual visit. I verified that I am speaking with the correct person using  two identifiers.I discussed the limitations of evaluation and management by telemedicine and the availability of in person appointments. The patient expressed understanding and agreed to proceed.    LOCATION: Patient: Home  Provider: Home office   History of Present Illness:  Pt was referred by Dr. Arfeen for OP therapy for bipolar disorder and anxiety.  Treatment Goal Addressed:  Pt will meet with clinician weekly for therapy to monitor for progress towards goals and address any barriers to success; Reduce depression from average severity level of 6/10 down to a 4/10 in next 6 months by engaging in 1-2 positive coping skills daily as part of developing self-care routine; Reduce average anxiety level from 7/10 down to 5/10 in next 6 months by utilizing 1-2 relaxation skills/grounding skills per day, such as mindful breathing, progressive muscle relaxation, positive visualizations.  Progress towards Treatment Goal: Progressing   Observations/Objective: Patient presented for today's session on time and was alert, oriented x5, with no evidence or self-report of SI/HI or A/V H.  Patient reported ongoing compliance with medication and denied any use of alcohol  or illicit substances. Clinician inquired about patient's current emotional ratings, as well as any significant changes in thoughts, feelings or behavior since previous session. Patient reported scores of  8/10 for depression, 8/10 for anxiety, 3/10 for anger/irritability. [name] and pt explored his emotional ratings, and discussed coping skills for his stressors. Pt identifies stressors and  allowed pt to explore and express thoughts and feelings associated with recent life situations and external stressors. [name] and pt reviewed his continued stressors individually: marital  issues, unfair division of martial finances, son's mental health, physical health, future, pt's sexual health after prostate cancer, lack of marital communication, family dysfunction. Im going with my son with mental health issues to [place]  to visit my daughter/his sister for vacation for a week. [name] asked open ended questions and making a plan for assisting his son managing his mental health during the vacation. [name] provided psychoeducation on having a relationship with someone with BPD.   Assessment and plan: Counselor will continue to meet with patient to address treatment plan goals. Patient will continue to follow recommendations of providers and imnd implement skills learned in session, and practice between sessions. Diagnosis: Bipolar 1 disorder.    Collaboration of care: Other:   Continue working with providers    Patient/Guardian was advised Release of Information must be obtained prior to any record release in order to collaborate their care with an outside provider. Patient/Guardian was advised if they have not already done so to contact the registration department to sign all necessary forms in order for us  to release information regarding their care.    Consent: Patient/Guardian gives verbal consent for treatment and assignment of benefits for services provided during this visit. Patient/Guardian expressed understanding and agreed to proceed.     Follow Up Instructions:  I discussed the assessment and treatment plan with the patient. The patient was provided an opportunity to ask questions and all were answered. The patient agreed with the plan and demonstrated an understanding of the instructions.   The patient was advised to call back or seek an in-person evaluation if the symptoms worsen or if the condition fails to improve as anticipated.  I provided 60 minutes of non-face-to-face time during this encounter.   Jarely Juncaj  S, LCAS 02/04/24

## 2024-02-09 ENCOUNTER — Encounter: Payer: Self-pay | Admitting: *Deleted

## 2024-02-09 NOTE — Progress Notes (Signed)
 This nurse faxed lab orders to  [facility] on [address] per patient request.  Office # [phone] Fax # [phone] Patient will get these drawn the week of July 7th when he gets back from vacation.   Once, we get these lab results faxed to the office, we need to send them to Danube with [facility] because she is working on his PA for Hiz[employer]ra . The only lab she will need faxed is the IgG, which determines the PA for this medication. He does IVIG in the home. Jennifer's fax number is [phone]. Put attention Delon on the fax sheet per her request.

## 2024-02-16 DIAGNOSIS — H3589 Other specified retinal disorders: Secondary | ICD-10-CM | POA: Diagnosis not present

## 2024-02-16 DIAGNOSIS — H35351 Cystoid macular degeneration, right eye: Secondary | ICD-10-CM | POA: Diagnosis not present

## 2024-02-16 DIAGNOSIS — H353121 Nonexudative age-related macular degeneration, left eye, early dry stage: Secondary | ICD-10-CM | POA: Diagnosis not present

## 2024-02-16 DIAGNOSIS — E119 Type 2 diabetes mellitus without complications: Secondary | ICD-10-CM | POA: Diagnosis not present

## 2024-02-17 DIAGNOSIS — D849 Immunodeficiency, unspecified: Secondary | ICD-10-CM | POA: Diagnosis not present

## 2024-02-17 DIAGNOSIS — C61 Malignant neoplasm of prostate: Secondary | ICD-10-CM | POA: Diagnosis not present

## 2024-02-17 DIAGNOSIS — D500 Iron deficiency anemia secondary to blood loss (chronic): Secondary | ICD-10-CM | POA: Diagnosis not present

## 2024-02-17 DIAGNOSIS — D803 Selective deficiency of immunoglobulin G [IgG] subclasses: Secondary | ICD-10-CM | POA: Diagnosis not present

## 2024-02-18 ENCOUNTER — Encounter (HOSPITAL_COMMUNITY): Payer: Self-pay

## 2024-02-18 ENCOUNTER — Ambulatory Visit (INDEPENDENT_AMBULATORY_CARE_PROVIDER_SITE_OTHER): Admitting: Licensed Clinical Social Worker

## 2024-02-18 ENCOUNTER — Encounter: Payer: Self-pay | Admitting: Cardiology

## 2024-02-18 ENCOUNTER — Encounter: Payer: Self-pay | Admitting: Physician Assistant

## 2024-02-18 ENCOUNTER — Ambulatory Visit (HOSPITAL_COMMUNITY): Admitting: Licensed Clinical Social Worker

## 2024-02-18 DIAGNOSIS — I77810 Thoracic aortic ectasia: Secondary | ICD-10-CM

## 2024-02-18 DIAGNOSIS — F319 Bipolar disorder, unspecified: Secondary | ICD-10-CM

## 2024-02-18 DIAGNOSIS — M170 Bilateral primary osteoarthritis of knee: Secondary | ICD-10-CM | POA: Diagnosis not present

## 2024-02-18 LAB — LAB REPORT - SCANNED: EGFR: 86

## 2024-02-22 ENCOUNTER — Encounter (HOSPITAL_COMMUNITY): Payer: Self-pay | Admitting: Licensed Clinical Social Worker

## 2024-02-22 NOTE — Progress Notes (Signed)
 Virtual virtual Video Note  I connected with Alex [name] on 02/18/2024 at 2:45--3:30pm EST by video-enabled virtual visit. I verified that I am speaking with the correct person using  two identifiers.I discussed the limitations of evaluation and management by telemedicine and the availability of in person appointments. The patient expressed understanding and agreed to proceed.    LOCATION: Patient: Home  Provider: Home office   History of Present Illness:  Pt was referred by Dr. Arfeen for OP therapy for bipolar disorder and anxiety.  Treatment Goal Addressed:  Pt will meet with clinician weekly for therapy to monitor for progress towards goals and address any barriers to success; Reduce depression from average severity level of 6/10 down to a 4/10 in next 6 months by engaging in 1-2 positive coping skills daily as part of developing self-care routine; Reduce average anxiety level from 7/10 down to 5/10 in next 6 months by utilizing 1-2 relaxation skills/grounding skills per day, such as mindful breathing, progressive muscle relaxation, positive visualizations.  Progress towards Treatment Goal: Progressing   Observations/Objective: Patient presented for today'[name] session on time and was alert, oriented x5, with no evidence or self-report of SI/HI or A/V H.  Patient reported ongoing compliance with medication and denied any use of alcohol  or illicit substances. Clinician inquired about patient'[name] current emotional ratings, as well as any significant changes in thoughts, feelings or behavior since previous session. Patient reported scores of  8/10 for depression, 8/10 for anxiety, 3/10 for anger/irritability. [name] and pt explored his emotional ratings, and discussed coping skills for his stressors. Pt identifies stressors and  allowed pt to explore and express thoughts and feelings associated with recent life situations and external stressors. [name] and pt reviewed his continued stressors individually:  marital issues, unfair division of martial finances, son'[name] mental health, physical health, future, [name] sexual health after prostate cancer, lack of marital communication, family dysfunction. Ive returned from [place]  to visit my daughter and her family with my son. It turned out to be a good time with no problems. [name] asked open ended questions,  I felt guilty going without my wife. [name] questioned his feelings and role played his thought processes about his wife.Clinician utilized MI OARS to reflect and summarize thoughts and feelings about this new normal he is experiencing  .   Assessment and plan: Counselor will continue to meet with patient to address treatment plan goals. Patient will continue to follow recommendations of providers and imnd implement skills learned in session, and practice between sessions. Diagnosis: Bipolar 1 disorder.    Collaboration of care: Other:   Continue working with providers    Patient/Guardian was advised Release of Information must be obtained prior to any record release in order to collaborate their care with an outside provider. Patient/Guardian was advised if they have not already done so to contact the registration department to sign all necessary forms in order for us  to release information regarding their care.    Consent: Patient/Guardian gives verbal consent for treatment and assignment of benefits for services provided during this visit. Patient/Guardian expressed understanding and agreed to proceed.     Follow Up Instructions:  I discussed the assessment and treatment plan with the patient. The patient was provided an opportunity to ask questions and all were answered. The patient agreed with the plan and demonstrated an understanding of the instructions.   The patient was advised to call back or seek an in-person evaluation if the symptoms worsen or if the condition fails  to improve as anticipated.  I provided 45 minutes of  non-face-to-face time during this encounter.   Alex [name], LCAS 02/18/24

## 2024-02-23 DIAGNOSIS — M170 Bilateral primary osteoarthritis of knee: Secondary | ICD-10-CM | POA: Diagnosis not present

## 2024-02-23 DIAGNOSIS — M47816 Spondylosis without myelopathy or radiculopathy, lumbar region: Secondary | ICD-10-CM | POA: Diagnosis not present

## 2024-02-25 ENCOUNTER — Encounter: Payer: Self-pay | Admitting: Hematology & Oncology

## 2024-02-25 ENCOUNTER — Ambulatory Visit (INDEPENDENT_AMBULATORY_CARE_PROVIDER_SITE_OTHER): Admitting: Licensed Clinical Social Worker

## 2024-02-25 DIAGNOSIS — M170 Bilateral primary osteoarthritis of knee: Secondary | ICD-10-CM | POA: Diagnosis not present

## 2024-02-25 DIAGNOSIS — F319 Bipolar disorder, unspecified: Secondary | ICD-10-CM | POA: Diagnosis not present

## 2024-02-26 ENCOUNTER — Encounter: Payer: Self-pay | Admitting: Hematology & Oncology

## 2024-02-26 ENCOUNTER — Inpatient Hospital Stay: Attending: Hematology & Oncology | Admitting: Hematology & Oncology

## 2024-02-26 ENCOUNTER — Other Ambulatory Visit: Payer: Self-pay

## 2024-02-26 VITALS — BP 99/71 | HR 76 | Temp 98.0°F | Ht 72.0 in | Wt 181.0 lb

## 2024-02-26 DIAGNOSIS — Z85828 Personal history of other malignant neoplasm of skin: Secondary | ICD-10-CM | POA: Insufficient documentation

## 2024-02-26 DIAGNOSIS — Z85850 Personal history of malignant neoplasm of thyroid: Secondary | ICD-10-CM | POA: Diagnosis not present

## 2024-02-26 DIAGNOSIS — E039 Hypothyroidism, unspecified: Secondary | ICD-10-CM | POA: Diagnosis not present

## 2024-02-26 DIAGNOSIS — M1990 Unspecified osteoarthritis, unspecified site: Secondary | ICD-10-CM | POA: Insufficient documentation

## 2024-02-26 DIAGNOSIS — Z862 Personal history of diseases of the blood and blood-forming organs and certain disorders involving the immune mechanism: Secondary | ICD-10-CM | POA: Insufficient documentation

## 2024-02-26 DIAGNOSIS — D839 Common variable immunodeficiency, unspecified: Secondary | ICD-10-CM | POA: Diagnosis not present

## 2024-02-26 DIAGNOSIS — C61 Malignant neoplasm of prostate: Secondary | ICD-10-CM | POA: Diagnosis not present

## 2024-02-26 NOTE — Progress Notes (Signed)
 Hematology and Oncology Follow Up Visit  [name] [id number] 1955/11/25 68 y.o. 02/26/2024   Principle Diagnosis:  CVID History of periorbital squamous cell carcinoma History of thyroid  cancer History of iron  deficiency anemia.  Current Therapy:   IVIG 40 g IV monthly IV iron -Venofer  given on 11/28/2022     Interim History:  [name] is back for follow-up.  We last saw him back in January.  He is doing okay.  He has been quite busy.  He has been helping with grandkids.  He has been down in [place]  to help babysit.  He has he is going go down to [place], [place]  next week for a celebration.  He is getting Avastin injections into his eye.  I think he has macular degeneration.  He did have lab work done before we saw him.  Thankfully, the lab work looked pretty good.  His immunoglobulin levels showed a IgG of 897 mg/dL.  His IgA was a bit low at 59 mg/dL and the IgM was low at 15 mg/dL.  His iron  studies looked good.  Ferritin was 87 with an iron  saturation of 28%.  He said that his PSA was also normal.  He has had no problem with bowels or bladder.  He has had no cough.  He is worried about his weight going up.  He has had little bit of leg swelling, more so in the left leg than the right leg.  Overall, I would have to say that his performance status is probably ECOG 1.  This comes to guess he can  Medications:  Current Outpatient Medications:    acetaminophen  (TYLENOL ) 500 MG tablet, Take by mouth., Disp: , Rfl:    amLODipine  (NORVASC ) 5 MG tablet, Take 1 tablet (5 mg total) by mouth daily., Disp: 90 tablet, Rfl: 3   aspirin  EC 81 MG tablet, Take 1 tablet by mouth daily., Disp: , Rfl:    atorvastatin  (LIPITOR) 40 MG tablet, TAKE 1 TABLET BY MOUTH EVERY DAY, Disp: 90 tablet, Rfl: 1   AXIRON  30 MG/ACT SOLN, Place 2 Act onto the skin daily., Disp: , Rfl:    brimonidine (ALPHAGAN) 0.2 % ophthalmic solution, SMARTSIG:In Eye(s), Disp: , Rfl:    Coenzyme Q10 (CO Q 10 PO), Take  1 capsule by mouth daily. , Disp: , Rfl:    EPINEPHrine  0.3 mg/0.3 mL IJ SOAJ injection, Inject 0.3 mg into the muscle as needed. (Patient not taking: Reported on 10/23/2023), Disp: , Rfl:    erythromycin ophthalmic ointment, Place into the right eye at bedtime., Disp: , Rfl:    furosemide  (LASIX ) 20 MG tablet, TAKE 1 TO 2 TABLETS BY MOUTH DAILY AS NEEDED (Patient not taking: Reported on 10/23/2023), Disp: 180 tablet, Rfl: 1   Immune Globulin, Human, 4 GM/20ML SOLN, Inject into the skin once a week. wednesday, Disp: , Rfl:    LamoTRIgine  300 MG TB24 24 hour tablet, Take 1 tablet (300 mg total) by mouth every morning., Disp: 90 tablet, Rfl: 0   latanoprost  (XALATAN ) 0.005 % ophthalmic solution, 1 DROP EACH EYE AT NIGHT, Disp: , Rfl:    levothyroxine  (SYNTHROID ) 150 MCG tablet, TAKE 1 TABLET BY MOUTH DAILY BEFORE BREAKFAST., Disp: 90 tablet, Rfl: 2   meloxicam  (MOBIC ) 15 MG tablet, TAKE 1 TABLET BY MOUTH EVERY DAY AS NEEDED, Disp: 90 tablet, Rfl: 0   Multiple Vitamin (MULTI-VITAMINS) TABS, Take by mouth., Disp: , Rfl:    OMEGA-3 KRILL OIL PO, Take 1 capsule by mouth daily. , Disp: ,  Rfl:    omeprazole (PRILOSEC) 20 MG capsule, Take 20 mg by mouth every evening., Disp: , Rfl:    sildenafil  (REVATIO ) 20 MG tablet, Take 1 tablet (20 mg total) by mouth as needed., Disp: 30 tablet, Rfl: 1   tadalafil  (CIALIS ) 5 MG tablet, Take 5 mg by mouth., Disp: , Rfl:    tamsulosin  (FLOMAX ) 0.4 MG CAPS capsule, TAKE 1 CAPSULE BY MOUTH EVERY DAY (Patient taking differently: as needed.), Disp: 90 capsule, Rfl: 1   telmisartan  (MICARDIS ) 80 MG tablet, Take 1 tablet (80 mg total) by mouth daily., Disp: 90 tablet, Rfl: 2   timolol  (TIMOPTIC ) 0.5 % ophthalmic solution, timolol  maleate 0.5 % eye drops  INSTILL 1 DROP INTO RIGHT EYE TWICE A DAY, Disp: , Rfl:    traMADol  (ULTRAM ) 50 MG tablet, Take 1 tablet (50 mg total) by mouth every 6 (six) hours as needed for moderate pain (pain score 4-6)., Disp: 12 tablet, Rfl: 0    zolpidem  (AMBIEN ) 10 MG tablet, TAKE 1/2 (ONE-HALF) TABLET BY MOUTH AT BEDTIME AND  TAKE  THE  OTHER  HALF  IF  NEEDED, Disp: 30 tablet, Rfl: 0  Allergies:  Allergies  Allergen Reactions   Oxycodone -Acetaminophen  Hives     Past Medical History, Surgical history, Social history, and Family History were reviewed and updated.  Review of Systems: Review of Systems  Constitutional: Negative.   HENT:  Negative.    Eyes: Negative.   Respiratory: Negative.    Cardiovascular: Negative.   Gastrointestinal: Negative.   Endocrine: Negative.   Genitourinary: Negative.    Musculoskeletal: Negative.   Skin: Negative.   Neurological: Negative.   Hematological: Negative.   Psychiatric/Behavioral: Negative.      Physical Exam:  height is 6' (1.829 m) and weight is 181 lb (82.1 kg). His oral temperature is 98 F (36.7 C). His blood pressure is 99/71 and his pulse is 76. His oxygen saturation is 97%.   Wt Readings from Last 3 Encounters:  02/26/24 181 lb (82.1 kg)  01/14/24 183 lb 6.4 oz (83.2 kg)  11/18/23 192 lb 8 oz (87.3 kg)    Physical Exam Vitals reviewed.  HENT:     Head: Normocephalic and atraumatic.  Eyes:     Pupils: Pupils are equal, round, and reactive to light.  Cardiovascular:     Rate and Rhythm: Normal rate and regular rhythm.     Heart sounds: Normal heart sounds.  Pulmonary:     Effort: Pulmonary effort is normal.     Breath sounds: Normal breath sounds.  Abdominal:     General: Bowel sounds are normal.     Palpations: Abdomen is soft.  Musculoskeletal:        General: No tenderness or deformity. Normal range of motion.     Cervical back: Normal range of motion.  Lymphadenopathy:     Cervical: No cervical adenopathy.  Skin:    General: Skin is warm and dry.     Findings: No erythema or rash.     Comments: In the right trapezius region, he has a nodule that probably measures about 7 x 7 mm.  It is red.  It is slightly firm but has slight fluctuance.   Neurological:     Mental Status: He is alert and oriented to person, place, and time.  Psychiatric:        Behavior: Behavior normal.        Thought Content: Thought content normal.        Judgment:  Judgment normal.      Lab Results  Component Value Date   WBC 8.1 08/26/2023   HGB 14.4 08/26/2023   HCT 44.6 08/26/2023   MCV 83.4 08/26/2023   PLT 293.0 08/26/2023     Chemistry      Component Value Date/Time   NA 137 08/26/2023 1600   NA 142 03/12/2023 1200   NA 139 06/27/2017 0950   K 4.3 08/26/2023 1600   K 4.0 06/27/2017 0950   CL 102 08/26/2023 1600   CO2 24 08/26/2023 1600   CO2 23 06/27/2017 0950   BUN 22 08/26/2023 1600   BUN 19 03/12/2023 1200   BUN 19.2 06/27/2017 0950   CREATININE 1.07 08/26/2023 1600   CREATININE 1.04 03/31/2023 1442   CREATININE 1.1 06/27/2017 0950      Component Value Date/Time   CALCIUM  8.9 08/26/2023 1600   CALCIUM  8.8 06/27/2017 0950   ALKPHOS 110 08/26/2023 1600   ALKPHOS 74 06/27/2017 0950   AST 32 08/26/2023 1600   AST 20 03/31/2023 1442   AST 26 06/27/2017 0950   ALT 36 08/26/2023 1600   ALT 28 03/31/2023 1442   ALT 40 06/27/2017 0950   BILITOT 0.5 08/26/2023 1600   BILITOT 0.5 03/31/2023 1442   BILITOT 1.05 06/27/2017 0950      Impression and Plan: Mr. Drewes is a very nice 68 year old male.  He has multiple problems.  He has had a history of periorbital squamous cell carcinoma.  This was treated.  He had thyroid  cancer.  This also was treated and should not be a problem.  From my point of view, everything is looking quite good.  He really is getting great care out of [facility].  I think he sees a few specialists out there.  I am just happy that he can do what he would like to do.  I feel bad that he has the arthritis.  I know this is causing some issues.  From my point of view, I think we can probably get him back in another 6 months.  We can strengthen her back after the Holiday season.   Maude JONELLE Crease,  MD 7/17/20253:40 PM

## 2024-03-01 ENCOUNTER — Encounter (HOSPITAL_COMMUNITY): Payer: Self-pay | Admitting: Licensed Clinical Social Worker

## 2024-03-01 DIAGNOSIS — M47816 Spondylosis without myelopathy or radiculopathy, lumbar region: Secondary | ICD-10-CM | POA: Diagnosis not present

## 2024-03-01 NOTE — Progress Notes (Signed)
 Virtual virtual Video Note  I connected with Alex [name] on 716/2025 at 2:00-2:45pm EST by video-enabled virtual visit. I verified that I am speaking with the correct person using  two identifiers.I discussed the limitations of evaluation and management by telemedicine and the availability of in person appointments. The patient expressed understanding and agreed to proceed.    LOCATION: Patient: Home  Provider: Home office   History of Present Illness:  Pt was referred by Dr. Arfeen for OP therapy for bipolar disorder and anxiety.  Treatment Goal Addressed:  Pt will meet with clinician weekly for therapy to monitor for progress towards goals and address any barriers to success; Reduce depression from average severity level of 6/10 down to a 4/10 in next 6 months by engaging in 1-2 positive coping skills daily as part of developing self-care routine; Reduce average anxiety level from 7/10 down to 5/10 in next 6 months by utilizing 1-2 relaxation skills/grounding skills per day, such as mindful breathing, progressive muscle relaxation, positive visualizations.  Progress towards Treatment Goal: Progressing   Observations/Objective: Patient presented for today'[name] session on time and was alert, oriented x5, with no evidence or self-report of SI/HI or A/V H.  Patient reported ongoing compliance with medication and denied any use of alcohol  or illicit substances. Clinician inquired about patient'[name] current emotional ratings, as well as any significant changes in thoughts, feelings or behavior since previous session. Patient reported scores of  8/10 for depression, 8/10 for anxiety, 3/10 for anger/irritability. [name] and pt explored his emotional ratings, and discussed coping skills for his stressors. Pt identifies stressors and  allowed pt to explore and express thoughts and feelings associated with recent life situations and external stressors. [name] and pt reviewed his continued stressors individually:  marital issues, unfair division of martial finances, son'[name] mental health, physical health, future, [name] sexual health after prostate cancer, lack of marital communication. Clinician processed thoughts, feelings, and behaviors. Clinician discussed coping skills and options for supporting himself through these challenging times.   Assessment and plan: Counselor will continue to meet with patient to address treatment plan goals. Patient will continue to follow recommendations of providers and imnd implement skills learned in session, and practice between sessions. Diagnosis: Bipolar 1 disorder.    Collaboration of care: Other:   Continue working with providers    Patient/Guardian was advised Release of Information must be obtained prior to any record release in order to collaborate their care with an outside provider. Patient/Guardian was advised if they have not already done so to contact the registration department to sign all necessary forms in order for us  to release information regarding their care.    Consent: Patient/Guardian gives verbal consent for treatment and assignment of benefits for services provided during this visit. Patient/Guardian expressed understanding and agreed to proceed.     Follow Up Instructions:  I discussed the assessment and treatment plan with the patient. The patient was provided an opportunity to ask questions and all were answered. The patient agreed with the plan and demonstrated an understanding of the instructions.   The patient was advised to call back or seek an in-person evaluation if the symptoms worsen or if the condition fails to improve as anticipated.  I provided 45 minutes of non-face-to-face time during this encounter.   Alex [name], LCAS 02/25/24

## 2024-03-03 ENCOUNTER — Ambulatory Visit (INDEPENDENT_AMBULATORY_CARE_PROVIDER_SITE_OTHER): Admitting: Licensed Clinical Social Worker

## 2024-03-03 DIAGNOSIS — M170 Bilateral primary osteoarthritis of knee: Secondary | ICD-10-CM | POA: Diagnosis not present

## 2024-03-03 DIAGNOSIS — F319 Bipolar disorder, unspecified: Secondary | ICD-10-CM | POA: Diagnosis not present

## 2024-03-08 ENCOUNTER — Encounter (HOSPITAL_COMMUNITY): Payer: Self-pay | Admitting: Licensed Clinical Social Worker

## 2024-03-08 NOTE — Progress Notes (Signed)
 Virtual virtual Video Note  I connected with Alex [name] on 723/2025 at 2:00-3:00pm EST by video-enabled virtual visit. I verified that I am speaking with the correct person using  two identifiers.I discussed the limitations of evaluation and management by telemedicine and the availability of in person appointments. The patient expressed understanding and agreed to proceed.    LOCATION: Patient: Home  Provider: Home office   History of Present Illness:  Pt was referred by Alex [name] for OP therapy for bipolar disorder and anxiety.  Treatment Goal Addressed:  Pt will meet with clinician weekly for therapy to monitor for progress towards goals and address any barriers to success; Reduce depression from average severity level of 6/10 down to a 4/10 in next 6 months by engaging in 1-2 positive coping skills daily as part of developing self-care routine; Reduce average anxiety level from 7/10 down to 5/10 in next 6 months by utilizing 1-2 relaxation skills/grounding skills per day, such as mindful breathing, progressive muscle relaxation, positive visualizations.  Progress towards Treatment Goal: Progressing   Observations/Objective: Patient presented for today's session on time and was alert, oriented x5, with no evidence or self-report of SI/HI or A/V H.  Patient reported ongoing compliance with medication and denied any use of alcohol  or illicit substances. Clinician inquired about patient's current emotional ratings, as well as any significant changes in thoughts, feelings or behavior since previous session. Patient reported scores of  8/10 for depression, 8/10 for anxiety, 3/10 for anger/irritability. [name] and pt explored his emotional ratings, and discussed coping skills for his stressors. Pt identifies stressors and  allowed pt to explore and express thoughts and feelings associated with recent life situations and external stressors. [name] and pt reviewed his continued stressors individually:  marital issues, unfair division of martial finances, son's mental health, physical health, future, pt's sexual health after prostate cancer, lack of marital communication. Clinician utilized MI OARS to reflect and summarize thoughts and feelings about this new normal life he is experiencing. My daughter's death day is tomorrow. [name] asked open ended questions and provided psychoeducation on the process of grief.  Assessment and plan: Counselor will continue to meet with patient to address treatment plan goals. Patient will continue to follow recommendations of providers and imnd implement skills learned in session, and practice between sessions. Diagnosis: Bipolar 1 disorder.    Collaboration of care: Other:   Continue working with providers    Patient/Guardian was advised Release of Information must be obtained prior to any record release in order to collaborate their care with an outside provider. Patient/Guardian was advised if they have not already done so to contact the registration department to sign all necessary forms in order for us  to release information regarding their care.    Consent: Patient/Guardian gives verbal consent for treatment and assignment of benefits for services provided during this visit. Patient/Guardian expressed understanding and agreed to proceed.     Follow Up Instructions:  I discussed the assessment and treatment plan with the patient. The patient was provided an opportunity to ask questions and all were answered. The patient agreed with the plan and demonstrated an understanding of the instructions.   The patient was advised to call back or seek an in-person evaluation if the symptoms worsen or if the condition fails to improve as anticipated.  I provided 45 minutes of non-face-to-face time during this encounter.   Alex [name] S, LCAS 03/03/24

## 2024-03-10 ENCOUNTER — Ambulatory Visit: Admitting: Dietician

## 2024-03-10 ENCOUNTER — Encounter (HOSPITAL_COMMUNITY): Payer: Self-pay | Admitting: Licensed Clinical Social Worker

## 2024-03-10 ENCOUNTER — Ambulatory Visit (INDEPENDENT_AMBULATORY_CARE_PROVIDER_SITE_OTHER): Admitting: Licensed Clinical Social Worker

## 2024-03-10 DIAGNOSIS — F319 Bipolar disorder, unspecified: Secondary | ICD-10-CM

## 2024-03-10 NOTE — Progress Notes (Incomplete)
 Assessment/Plan:   Alex [name] is a very pleasant 68 y.o. year old RH male with extensive medical history including bipolar disorder, depression, anxiety, history of hypertension, CAD, chronic left foot drop and low back pain with spondylolysis, seen today for evaluation of memory loss. MoCA today is 25 /30, etiology, unclear although it might be multifactorial.  Patient is able to participate on ADLs.  Patient continues to drive without significant difficulties.  Mood is controlled by psychiatry   Memory Impairment of unclear etiology  MRI brain without contrast to assess for underlying structural abnormality and assess vascular load  Neurocognitive testing to further evaluate cognitive concerns and determine other underlying cause of memory changes, including potential contribution from sleep, anxiety, attention, or depression  B12, Vit D and TSH  Continue to control mood as per psychiatry, recommend discontinuing Ambien  5 mg nightly due to negative effect on memory.  Patient to discuss with Dr. Arfeen. Agree with recommending sleep studies, he is to discuss with his PCP. Consider changing omeprazole to an alternative medication (i.e. lansoprazole) as there is evidence that if taking greater than 3 years, can contribute to memory difficulties.  Recommend good control of cardiovascular risk factors Folllow up pending on the results of the above   Subjective:   The patient is here alone   How long did patient have memory difficulties? For the last 3 months. Sometimes he has trouble retrieving some words. LTM is total recall. I start a task and then I start something else. It is hard sometimes to focus on a conversation for quite a while Reports some difficulty remembering new information, conversations and names.  During the day does some household chores. Does some computer and cell phone work.  repeats oneself?  Endorsed ,sometimes Disoriented when walking into a room?   Patient denies except occasionally not remembering what patient came to the room for , but I am distracted a lot  Leaving objects in unusual places? Denies.   Wandering behavior?  denies .  Any personality changes?  Not specifically Any history of depression?:  Endorsed, situational . Exacerbated by the recent anniversary of his daughter's death and other life situations Hallucinations or paranoia?  Denies.  Seizures?  Denies    Any sleep changes? Does not sleep well because of my thoughts, back pain and nocturia Reports vivid dreams, nightmares including last night about fighting with alligators trying to ave his grandson, and my wife had to wake me up, +REM behavior while dreaming, kicking, having conversations or yelling,  or sleepwalking.   Sleep apnea? Never been tested but he suspects he may.  Any hygiene concerns? Sometimes I put off things Independent of bathing and dressing?  Endorsed  Does the patient needs help with medications? Patient is in charge, does not miss doses, I have a morning routine  Who is in charge of the finances? Patient is in charge    Any changes in appetite?  I saw a nutritionist lately, I am doing an antioxidant diet .  Patient have trouble swallowing? Denies.   Does the patient cook? Yes, denies any issues    Any kitchen accidents such as leaving the stove on? Denies.   Any history of headaches?   Denies.   Chronic pain ?  Chronic lumbar pain, worse with prolonged walking, he attends the pain clinic.  Ambulates with difficulty? Walk fine but with pain   Recent falls or head injuries? Denies.   Vision changes?  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  I also have twitching of my right eye (he spent significant amount of time on the screen, has anxiety, which may contribute to the  symptoms.) Any stroke like symptoms? Denies.   Any tremors?   Denies.   Any anosmia?  Denies.  But taste is weaker Any incontinence of urine? Denies during the day, nocturia.   Any bowel dysfunction? Denies.      Patient lives with  his wife History of heavy alcohol  intake? Denies.   History of heavy tobacco use? Denies.   Family history of dementia? Father had dementia, ?PD. Does patient drive? Yes  but depth perception is off Disabled since 2006. College degree.    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 Maude Halt, oncology. Receives monthly IVIG 40 grams.   Depression    Epicondylitis    right elbow   GERD (gastroesophageal reflux disease)    Glaucoma BOTH EYES   RIGHT EYE RADIATION DAMAGE   Hearing loss    Bilateral   Hepatic cyst    Several, The lesion of concern in segment 6 of the liver has single large portal vein and hepatic vein branches  extending to tt, in a pattern of enhancement which mirrors these vascular structures. 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 9 CM X 4. 3 CM PER NOV 06-14-17  CHEST CTFOLOWED BY DR PIETRO YEARLY FOR   Positional vertigo    HX OF WITH SINUS INFECTIONS   Prostate cancer (HCC)    Follows w/ Dr. Debby Polascik @ [facility].   Radial head fracture    Right   Squamous cell carcinoma of skin 06/04/2016   in situ-crown of scalp   Squamous cell carcinoma of skin 04/22/2017   in situ-crown scalp (txpbx)   Thoracic aortic aneurysm (HCC) 06/14/2017   last CT 4.1 CM Mild   Tinnitus    CONSTANT   Ulnar nerve compression    right elbow   Unsteady gait    especially with stairs, depth perception off   Urinary hesitancy    Wears glasses      Past Surgical History:  Procedure Laterality Date   CARDIAC CATHETERIZATION  01-16-2006  DR DEBBY WALL   MILD CORONARY ATHEROSCLEROSIS/ MID TO DISTAL LAD 40% STENOSIS/ LVF 50-55%   CARPAL TUNNEL RELEASE Right 11/03/2017   Procedure: RIGHT HAND CARPAL TUNNEL RELEASE;  Surgeon: Shari Easter, MD;  Location: [facility];   Service: Orthopedics;  Laterality: Right;   CATARACT EXTRACTION Right    COLONSCOPY  2017 LAST DONE   MULTIPLE   CYST EXCISION Right 09/04/2023   Procedure: EXCISION CYST RIGHT SHOULDER;  Surgeon: Eletha Boas, MD;  Location: [facility];  Service: General;  Laterality: Right;  LOCAL & MAC   ENDOSCOPY  LAST 2017   MULTIPLE DONE DILATION DONE ALSO   ESOPHAGOGASTRODUODENOSCOPY (EGD) WITH PROPOFOL  N/A 02/24/2018   Procedure: ESOPHAGOGASTRODUODENOSCOPY (EGD) WITH PROPOFOL ;  Surgeon: Celestia Agent, MD;  Location: [facility];  Service: Endoscopy;  Laterality: N/A;   EXCISION RADIAL HEAD Right 11/03/2017   Procedure: RIGHT PROXIMAL RADIUS RADIAL HEAD RESECTION AND JOINT DEBRIDEMENT;  Surgeon: Shari Easter, MD;  Location: [facility];  Service: Orthopedics;  Laterality: Right;   EXTRACORPOREAL SHOCK WAVE LITHOTRIPSY Right 11/20/2020   Procedure: EXTRACORPOREAL SHOCK WAVE LITHOTRIPSY (ESWL);  Surgeon: Alvaro Hummer, MD;  Location: [facility];  Service: Urology;  Laterality: Right;  75 MINS   KNEE ARTHROSCOPY  05/01/2012   Procedure: ARTHROSCOPY KNEE;  Surgeon: Reyes JAYSON Billing, MD;  Location: [facility];  Service: Orthopedics;  Laterality: Left;  debridement and removal of loose body   LEFT ANKLE ARTHROSCOPY W/ DEBRIDEMENT  05/12/2007   LEFT HYDROCELECTOMY  03/29/2005   AND REPAIR LEFT INGUINAL HERNIA W/ MESH   MOHS SURGERY  2002   RIGHT ORBITAL CANCER   NASAL ENDOSCOPY  08/07/2005   RIGHT EPISTAXIS  / POST SEPTOPLASTY  (HX RIGHT ORBITAL CA & S/P RADIATION/ NECROSIS ANTERIOR END OF BOTH INFERIOR TURBINATES)   occuloplastic surgery  2002   PARS PLANA VITRECTOMY  11/06/2004   RIGHT EYE RADIATION RETINOPATHY W/ HEMORRHAGE   PROSTATE ABLATION N/A 06/2022   RADIAL HEAD ARTHROPLASTY Right 06/15/2018   Procedure: RIGHT ELBOW PROXIMAL RADIOULNAR JOINT DEBRIDEMENT AND ARTHROPLASTY;  Surgeon: Shari Easter, MD;  [facility];  Service: Orthopedics;  Laterality: Right;  BLOCK WITH SEDATION   REPAIR UNDESENDED RIGHT TESTICLE / RIGHT INGUINAL HERNIA  AGE 43   RIGHT ANKLE ARTHROSCOPY W/ EXTENSIVE DEBRIDEMENT  04/05/2008   x2   RIGHT SHOULDER SURGERY  2006   RIGHT SUPRAOMOHYOID NECK DISSECTION   03/08/2003   ZONES 1,2,3;   SUBMANDIBULAR MASS /  METASTATIC SQUAMOUS CELL CARCINOMA RIGHT NECK   SAVORY DILATION N/A 02/24/2018   Procedure: SAVORY DILATION;  Surgeon: Celestia Agent, MD;  Location: [facility];  Service: Endoscopy;  Laterality: N/A;   SEPTOPLASTY  06/2005   SHOULDER ARTHROSCOPY Left    SHOULDER ARTHROSCOPY W/ SUBACROMIAL DECOMPRESSION AND DISTAL CLAVICLE EXCISION  10/09/2008   AND DEBRIDEMENT OF RIGHT SHOULDER IMPINGEMENT & AC JOINT ARTHRITIS   SPINE SURGERY  2016   l 3 TO l 4 PLATE AND SCREWS   TOTAL THYROIDECTOMY  11/03/2001   PAPILLARY THYROID  CARCINOMA   TRANSTHORACIC ECHOCARDIOGRAM  07/2011   grade I diastolic dysfunction/ ef 55-60%   ULNAR NERVE TRANSPOSITION Right 04/28/2014   Procedure: RIGHT ELBOW ULNA NERVE RELEASE TRANSPOSTION AND MEDIAL EPICONDYLAR DEBRIDEMENT AND REPAIR;  Surgeon: Prentice LELON Pagan, MD;  Location: [facility];  Service: Orthopedics;  Laterality: Right;     Allergies  Allergen Reactions   Oxycodone -Acetaminophen  Hives    Current Outpatient Medications  Medication Instructions   acetaminophen  (TYLENOL ) 500 MG tablet Take by mouth.   amLODipine  (NORVASC ) 5 mg, Oral, Daily   aspirin  EC 81 MG tablet 1 tablet, Daily   atorvastatin  (LIPITOR) 40 mg, Oral, Daily   AXIRON  30 MG/ACT SOLN 2 Act, Daily   brimonidine (ALPHAGAN) 0.2 % ophthalmic solution SMARTSIG:In Eye(s)   Coenzyme Q10 (CO Q 10 PO) 1 capsule, Daily   EPINEPHrine  (EPI-PEN) 0.3 mg, As needed   erythromycin ophthalmic ointment Daily at bedtime   furosemide  (LASIX ) 20 MG tablet TAKE 1 TO 2 TABLETS BY MOUTH DAILY AS NEEDED   Immune Globulin, Human, 4 GM/20ML SOLN Weekly   LamoTRIgine  300 mg,  Oral, BH-each morning   latanoprost  (XALATAN ) 0.005 % ophthalmic solution 1 DROP EACH EYE AT NIGHT   levothyroxine  (SYNTHROID ) 150 mcg, Oral, Daily before breakfast   meloxicam  (MOBIC ) 15 MG tablet TAKE 1 TABLET BY MOUTH EVERY DAY AS NEEDED   Multiple Vitamin (MULTI-VITAMINS) TABS Take by mouth.   OMEGA-3 KRILL OIL PO 1 capsule, Daily   omeprazole (PRILOSEC) 20 mg, Every evening   sildenafil  (REVATIO ) 20 mg, Oral, As needed   tadalafil  (CIALIS ) 5 mg   tamsulosin  (FLOMAX ) 0.4 MG CAPS capsule TAKE 1 CAPSULE BY MOUTH EVERY DAY   telmisartan  (MICARDIS ) 80 mg, Oral, Daily   timolol  (TIMOPTIC ) 0.5 % ophthalmic solution timolol  maleate 0.5 % eye drops  INSTILL 1 DROP INTO RIGHT EYE TWICE A DAY   traMADol  (ULTRAM ) 50 mg, Oral, Every 6 hours PRN   zolpidem  (AMBIEN ) 10 MG tablet TAKE 1/2 (ONE-HALF) TABLET BY MOUTH AT BEDTIME AND  TAKE  THE  OTHER  HALF  IF  NEEDED     VITALS:   Vitals:   03/11/24 0743  BP: 123/80  Resp: 20  SpO2: 98%  Weight: 185 lb (83.9 kg)  Height: 6' (1.829 m)         03/11/2024   10:00 AM  Montreal Cognitive Assessment   Visuospatial/ Executive (0/5) 3  Naming (0/3) 3  Attention: Read list of digits (0/2) 2  Attention: Read list of letters (0/1) 1  Attention: Serial 7 subtraction starting at 100 (0/3) 3  Language: Repeat phrase (0/2) 1  Language : Fluency (0/1) 1  Abstraction (0/2) 2  Delayed Recall (0/5) 3  Orientation (0/6) 6  Total 25  Adjusted Score (based on education) 25        No data to display           PHYSICAL EXAM   HEENT:  Normocephalic, atraumatic. The superficial temporal arteries are without ropiness or tenderness. Cardiovascular: Regular rate and rhythm. Lungs: Clear to auscultation bilaterally. Neck: There are no carotid bruits noted bilaterally.  Orientation:  Alert and oriented to person, place and time. No aphasia or dysarthria. Fund of knowledge is appropriate. Recent memory impaired and remote memory intact.  Attention and  concentration are normal.  Able to name objects and repeat phrases. Delayed recall 3/5 Cranial nerves: There is good facial symmetry.  Anxious appearing.  Extraocular muscles are intact and visual fields are full to confrontational testing on the left, on the right there is hemianopsia, and reduced vision. Speech is fluent and clear, very eloquent. No tongue deviation. Hearing is intact to conversational tone. Tone: Tone is good throughout. Abnormal movements: No tremors. No Asterixis. No Fasciculations Sensation: Sensation is intact to light touch. Vibration is intact at the bilateral big toe.  Coordination: The patient has no difficulty with RAM's or FNF bilaterally. Normal finger to nose  Motor: Strength is 5/5 in the bilateral upper and lower extremities. There is no pronator drift. There are no fasciculations noted. DTR's: Deep tendon reflexes are 2/4 bilaterally. Gait and Station: The patient is able to ambulate without difficulty The patient is able to heel toe walk. Gait is cautious and narrow. The patient is able to ambulate in a tandem fashion.       Thank you for allowing us  the opportunity to participate in the care of this nice patient. Please do not hesitate to contact us  for any questions or concerns.   Total time spent on today's visit was 61 minutes dedicated to this patient today, preparing to see patient, examining the patient, ordering tests and/or medications and counseling the patient, documenting clinical information in the EHR or other health record, independently interpreting results and communicating results to the patient/family, discussing treatment and goals, answering patient's questions and coordinating care.  Cc:  Plotnikov, Karlynn GAILS, MD  Camie Sevin 03/11/2024 10:48 AM

## 2024-03-10 NOTE — Progress Notes (Signed)
 Virtual virtual Video Note  I connected with Alex [name] on 730/2025 at 2:00-3:00pm EST by video-enabled virtual visit. I verified that I am speaking with the correct person using  two identifiers.I discussed the limitations of evaluation and management by telemedicine and the availability of in person appointments. The patient expressed understanding and agreed to proceed.    LOCATION: Patient: Home  Provider: Home office   History of Present Illness:  Pt was referred by Dr. Arfeen for OP therapy for bipolar disorder and anxiety.  Treatment Goal Addressed:  Pt will meet with clinician weekly for therapy to monitor for progress towards goals and address any barriers to success; Reduce depression from average severity level of 6/10 down to a 4/10 in next 6 months by engaging in 1-2 positive coping skills daily as part of developing self-care routine; Reduce average anxiety level from 7/10 down to 5/10 in next 6 months by utilizing 1-2 relaxation skills/grounding skills per day, such as mindful breathing, progressive muscle relaxation, positive visualizations.  Progress towards Treatment Goal: Progressing   Observations/Objective: Patient presented for today's session on time and was alert, oriented x5, with no evidence or self-report of SI/HI or A/V H.  Patient reported ongoing compliance with medication and denied any use of alcohol  or illicit substances. Clinician inquired about patient's current emotional ratings, as well as any significant changes in thoughts, feelings or behavior since previous session. Patient reported scores of  8/10 for depression, 8/10 for anxiety, 3/10 for anger/irritability. [name] and pt explored his emotional ratings, and discussed coping skills for his stressors. Pt identifies stressors and  allowed pt to explore and express thoughts and feelings associated with recent life situations and external stressors. [name] and pt reviewed his continued stressors individually:  marital issues, unfair division of martial finances, son's mental health, physical health, future, pt's sexual health after prostate cancer, lack of marital communication. My daughter's death day was last week and it was a difficult day for the family. [name] asked open ended questions and again, provided psychoeducation on the process of grief.  Assessment and plan: Counselor will continue to meet with patient to address treatment plan goals. Patient will continue to follow recommendations of providers and imnd implement skills learned in session, and practice between sessions. Diagnosis: Bipolar 1 disorder.    Collaboration of care: Other:   Continue working with providers    Patient/Guardian was advised Release of Information must be obtained prior to any record release in order to collaborate their care with an outside provider. Patient/Guardian was advised if they have not already done so to contact the registration department to sign all necessary forms in order for us  to release information regarding their care.    Consent: Patient/Guardian gives verbal consent for treatment and assignment of benefits for services provided during this visit. Patient/Guardian expressed understanding and agreed to proceed.     Follow Up Instructions:  I discussed the assessment and treatment plan with the patient. The patient was provided an opportunity to ask questions and all were answered. The patient agreed with the plan and demonstrated an understanding of the instructions.   The patient was advised to call back or seek an in-person evaluation if the symptoms worsen or if the condition fails to improve as anticipated.  I provided 60 minutes of non-face-to-face time during this encounter.   Lysha Schrade S, LCAS 03/10/24

## 2024-03-11 ENCOUNTER — Other Ambulatory Visit

## 2024-03-11 ENCOUNTER — Ambulatory Visit (INDEPENDENT_AMBULATORY_CARE_PROVIDER_SITE_OTHER): Admitting: Physician Assistant

## 2024-03-11 ENCOUNTER — Encounter: Payer: Self-pay | Admitting: Physician Assistant

## 2024-03-11 ENCOUNTER — Ambulatory Visit

## 2024-03-11 VITALS — BP 123/80 | Resp 20 | Ht 72.0 in | Wt 185.0 lb

## 2024-03-11 DIAGNOSIS — R413 Other amnesia: Secondary | ICD-10-CM

## 2024-03-11 DIAGNOSIS — R7989 Other specified abnormal findings of blood chemistry: Secondary | ICD-10-CM | POA: Diagnosis not present

## 2024-03-11 DIAGNOSIS — F419 Anxiety disorder, unspecified: Secondary | ICD-10-CM | POA: Diagnosis not present

## 2024-03-11 DIAGNOSIS — M25522 Pain in left elbow: Secondary | ICD-10-CM | POA: Diagnosis not present

## 2024-03-11 NOTE — Patient Instructions (Addendum)
 It was a pleasure to see you today at our office.   Recommendations:  Neurocognitive evaluation at our office   MRI of the brain, the radiology office will call you to arrange you appointment  [phone] Check labs today [address]  Follow up with ohpthalmology Follow up  pending on the above results      [email or web address]   RECOMMENDATIONS FOR ALL PATIENTS WITH MEMORY PROBLEMS: 1. Continue to exercise (Recommend 30 minutes of walking everyday, or 3 hours every week) 2. Increase social interactions - continue going to [place] and enjoy social gatherings with friends and family 3. Eat healthy, avoid fried foods and eat more fruits and vegetables 4. Maintain adequate blood pressure, blood sugar, and blood cholesterol level. Reducing the risk of stroke and cardiovascular disease also helps promoting better memory. 5. Avoid stressful situations. Live a simple life and avoid aggravations. Organize your time and prepare for the next day in anticipation. 6. Sleep well, avoid any interruptions of sleep and avoid any distractions in the bedroom that may interfere with adequate sleep quality 7. Avoid sugar, avoid sweets as there is a strong link between excessive sugar intake, diabetes, and cognitive impairment We discussed the Mediterranean diet, which has been shown to help patients reduce the risk of progressive memory disorders and reduces cardiovascular risk. This includes eating fish, eat fruits and green leafy vegetables, nuts like almonds and hazelnuts, walnuts, and also use olive oil. Avoid fast foods and fried foods as much as possible. Avoid sweets and sugar as sugar use has been linked to worsening of memory function.  There is always a concern of gradual progression of memory problems. If this is the case, then we may need to adjust level of care according to patient needs. Support, both to the patient and caregiver, should then be  put into place.      You have been referred for a neuropsychological evaluation (i.e., evaluation of memory and thinking abilities). Please bring someone with you to this appointment if possible, as it is helpful for the doctor to hear from both you and another adult who knows you well. Please bring eyeglasses and hearing aids if you wear them.    The evaluation will take approximately 3 hours and has two parts:   The first part is a clinical interview with the neuropsychologist (Dr. Richie or Dr. Gayland). During the interview, the neuropsychologist will speak with you and the individual you brought to the appointment.    The second part of the evaluation is testing with the doctor's technician Neal or Luke). During the testing, the technician will ask you to remember different types of material, solve problems, and answer some questionnaires. Your family member will not be present for this portion of the evaluation.   Please note: We must reserve several hours of the neuropsychologist's time and the psychometrician's time for your evaluation appointment. As such, there is a No-Show fee of $100. If you are unable to attend any of your appointments, please contact our office as soon as possible to reschedule.      DRIVING: Regarding driving, in patients with progressive memory problems, driving will be impaired. We advise to have someone else do the driving if trouble finding directions or if minor accidents are reported. Independent driving assessment is available to determine safety of driving.   If you are interested in the driving assessment, you can contact the following:  [employer] in [place] [phone]  [employer] [phone]  [facility] [phone]  [facility] [phone] or [phone]   FALL PRECAUTIONS: Be cautious when walking. Scan the area for obstacles that may increase the risk of trips and falls. When getting  up in the mornings, sit up at the edge of the bed for a few minutes before getting out of bed. Consider elevating the bed at the head end to avoid drop of blood pressure when getting up. Walk always in a well-lit room (use night lights in the walls). Avoid area rugs or power cords from appliances in the middle of the walkways. Use a walker or a cane if necessary and consider physical therapy for balance exercise. Get your eyesight checked regularly.  FINANCIAL OVERSIGHT: Supervision, especially oversight when making financial decisions or transactions is also recommended.  HOME SAFETY: Consider the safety of the kitchen when operating appliances like stoves, microwave oven, and blender. Consider having supervision and share cooking responsibilities until no longer able to participate in those. Accidents with firearms and other hazards in the house should be identified and addressed as well.   ABILITY TO BE LEFT ALONE: If patient is unable to contact 911 operator, consider using LifeLine, or when the need is there, arrange for someone to stay with patients. Smoking is a fire hazard, consider supervision or cessation. Risk of wandering should be assessed by caregiver and if detected at any point, supervision and safe proof recommendations should be instituted.  MEDICATION SUPERVISION: Inability to self-administer medication needs to be constantly addressed. Implement a mechanism to ensure safe administration of the medications.      Mediterranean Diet A Mediterranean diet refers to food and lifestyle choices that are based on the traditions of countries located on the [employer]. This way of eating has been shown to help prevent certain conditions and improve outcomes for people who have chronic diseases, like kidney disease and heart disease. What are tips for following this plan? Lifestyle  Cook and eat meals together with your family, when possible. Drink enough fluid to keep your urine  clear or pale yellow. Be physically active every day. This includes: Aerobic exercise like running or swimming. Leisure activities like gardening, walking, or housework. Get 7-8 hours of sleep each night. If recommended by your health care provider, drink red wine in moderation. This means 1 glass a day for nonpregnant women and 2 glasses a day for men. A glass of wine equals 5 oz (150 mL). Reading food labels  Check the serving size of packaged foods. For foods such as rice and pasta, the serving size refers to the amount of cooked product, not dry. Check the total fat in packaged foods. Avoid foods that have saturated fat or trans fats. Check the ingredients list for added sugars, such as corn syrup. Shopping  At the grocery store, buy most of your food from the areas near the walls of the store. This includes: Fresh fruits and vegetables (produce). Grains, beans, nuts, and seeds. Some of these may be available in unpackaged forms or large amounts (in bulk). Fresh seafood. Poultry and eggs. Low-fat dairy products. Buy whole ingredients instead of prepackaged foods. Buy fresh fruits and vegetables in-season from local farmers markets. Buy frozen fruits and vegetables in resealable bags. If you do not have access to quality fresh seafood, buy precooked frozen shrimp or canned fish, such as tuna, salmon, or sardines. Buy small amounts of raw or cooked vegetables, salads, or olives from the deli or salad bar at your store. Stock Water quality scientist so  you always have certain foods on hand, such as olive oil, canned tuna, canned tomatoes, rice, pasta, and beans. Cooking  Cook foods with extra-virgin olive oil instead of using butter or other vegetable oils. Have meat as a side dish, and have vegetables or grains as your main dish. This means having meat in small portions or adding small amounts of meat to foods like pasta or stew. Use beans or vegetables instead of meat in common dishes like chili or  lasagna. Experiment with different cooking methods. Try roasting or broiling vegetables instead of steaming or sauteing them. Add frozen vegetables to soups, stews, pasta, or rice. Add nuts or seeds for added healthy fat at each meal. You can add these to yogurt, salads, or vegetable dishes. Marinate fish or vegetables using olive oil, lemon juice, garlic, and fresh herbs. Meal planning  Plan to eat 1 vegetarian meal one day each week. Try to work up to 2 vegetarian meals, if possible. Eat seafood 2 or more times a week. Have healthy snacks readily available, such as: Vegetable sticks with hummus. Greek yogurt. Fruit and nut trail mix. Eat balanced meals throughout the week. This includes: Fruit: 2-3 servings a day Vegetables: 4-5 servings a day Low-fat dairy: 2 servings a day Fish, poultry, or lean meat: 1 serving a day Beans and legumes: 2 or more servings a week Nuts and seeds: 1-2 servings a day Whole grains: 6-8 servings a day Extra-virgin olive oil: 3-4 servings a day Limit red meat and sweets to only a few servings a month What are my food choices? Mediterranean diet Recommended Grains: Whole-grain pasta. Brown rice. Bulgar wheat. Polenta. Couscous. Whole-wheat bread. [place] Made[place]. Vegetables: Artichokes. Beets. Broccoli. Cabbage. Carrots. Eggplant. Green beans. Chard. Kale. Spinach. Onions. Leeks. Peas. Squash. Tomatoes. Peppers. Radishes. Fruits: Apples. Apricots. Avocado. Berries. Bananas. Cherries. Dates. Figs. Grapes. Lemons. Melon. Oranges. Peaches. Plums. Pomegranate. Meats and other protein foods: Beans. Almonds. Sunflower seeds. Pine nuts. Peanuts. Cod. Salmon. Scallops. Shrimp. Tuna. Tilapia. Clams. Oysters. Eggs. Dairy: Low-fat milk. Cheese. Greek yogurt. Beverages: Water. Red wine. Herbal tea. Fats and oils: Extra virgin olive oil. Avocado oil. Grape seed oil. Sweets and desserts: [place] yogurt with honey. Baked apples. Poached pears. Trail mix. Seasoning and  other foods: Basil. Cilantro. Coriander. Cumin. Mint. Parsley. Sage. Rosemary. Tarragon. Garlic. Oregano. Thyme. Pepper. Balsalmic vinegar. Tahini. Hummus. Tomato sauce. Olives. Mushrooms. Limit these Grains: Prepackaged pasta or rice dishes. Prepackaged cereal with added sugar. Vegetables: Deep fried potatoes (french fries). Fruits: Fruit canned in syrup. Meats and other protein foods: Beef. Pork. Lamb. Poultry with skin. Hot dogs. [place]. Dairy: Ice cream. Sour cream. Whole milk. Beverages: Juice. Sugar-sweetened soft drinks. Beer. Liquor and spirits. Fats and oils: Butter. Canola oil. Vegetable oil. Beef fat (tallow). Lard. Sweets and desserts: Cookies. Cakes. Pies. Candy. Seasoning and other foods: Mayonnaise. Premade sauces and marinades. The items listed may not be a complete list. Talk with your dietitian about what dietary choices are right for you. Summary The Mediterranean diet includes both food and lifestyle choices. Eat a variety of fresh fruits and vegetables, beans, nuts, seeds, and whole grains. Limit the amount of red meat and sweets that you eat. Talk with your health care provider about whether it is safe for you to drink red wine in moderation. This means 1 glass a day for nonpregnant women and 2 glasses a day for men. A glass of wine equals 5 oz (150 mL). This information is not intended to replace advice given to you by  your health care provider. Make sure you discuss any questions you have with your health care provider. Document Released: 03/21/2016 Document Revised: 04/23/2016 Document Reviewed: 03/21/2016 Elsevier Interactive Patient Education  2017 [employer].

## 2024-03-12 ENCOUNTER — Ambulatory Visit: Payer: Self-pay | Admitting: Neurology

## 2024-03-12 ENCOUNTER — Other Ambulatory Visit: Payer: Self-pay

## 2024-03-12 DIAGNOSIS — R7989 Other specified abnormal findings of blood chemistry: Secondary | ICD-10-CM

## 2024-03-12 DIAGNOSIS — R413 Other amnesia: Secondary | ICD-10-CM

## 2024-03-12 LAB — VITAMIN D 25 HYDROXY (VIT D DEFICIENCY, FRACTURES): Vit D, 25-Hydroxy: 54 ng/mL (ref 30–100)

## 2024-03-12 LAB — TEST AUTHORIZATION

## 2024-03-12 LAB — TSH: TSH: 7.92 m[IU]/L — ABNORMAL HIGH (ref 0.40–4.50)

## 2024-03-12 LAB — T3, FREE: T3, Free: 2.8 pg/mL (ref 2.3–4.2)

## 2024-03-12 LAB — T4, FREE: Free T4: 1.4 ng/dL (ref 0.8–1.8)

## 2024-03-12 LAB — VITAMIN B12: Vitamin B-12: 875 pg/mL (ref 200–1100)

## 2024-03-12 NOTE — Progress Notes (Signed)
 Added labs, to call lab, left message to call office.

## 2024-03-13 ENCOUNTER — Other Ambulatory Visit (HOSPITAL_COMMUNITY): Payer: Self-pay | Admitting: Psychiatry

## 2024-03-13 DIAGNOSIS — F411 Generalized anxiety disorder: Secondary | ICD-10-CM

## 2024-03-13 DIAGNOSIS — F5101 Primary insomnia: Secondary | ICD-10-CM

## 2024-03-15 DIAGNOSIS — T66XXXA Radiation sickness, unspecified, initial encounter: Secondary | ICD-10-CM | POA: Diagnosis not present

## 2024-03-15 DIAGNOSIS — H3589 Other specified retinal disorders: Secondary | ICD-10-CM | POA: Diagnosis not present

## 2024-03-16 ENCOUNTER — Telehealth (HOSPITAL_COMMUNITY): Payer: Self-pay

## 2024-03-16 DIAGNOSIS — M47816 Spondylosis without myelopathy or radiculopathy, lumbar region: Secondary | ICD-10-CM | POA: Diagnosis not present

## 2024-03-16 DIAGNOSIS — F5101 Primary insomnia: Secondary | ICD-10-CM

## 2024-03-16 DIAGNOSIS — F411 Generalized anxiety disorder: Secondary | ICD-10-CM

## 2024-03-16 MED ORDER — ZOLPIDEM TARTRATE 10 MG PO TABS: ORAL_TABLET | ORAL | 0 refills | Status: DC

## 2024-03-16 NOTE — Telephone Encounter (Signed)
 Done. Please inform patient

## 2024-03-16 NOTE — Telephone Encounter (Signed)
 Hello,   Patient Called today asking for a refill of His zolpidem  (AMBIEN ) 10 MG tablet to be sent to on file pharmacy.   Patient last seen: 01/14/2024

## 2024-03-16 NOTE — Addendum Note (Signed)
 Addended by: CURRY PATERSON T on: 03/16/2024 08:41 AM   Modules accepted: Orders

## 2024-03-17 ENCOUNTER — Ambulatory Visit (INDEPENDENT_AMBULATORY_CARE_PROVIDER_SITE_OTHER): Admitting: Licensed Clinical Social Worker

## 2024-03-17 DIAGNOSIS — F319 Bipolar disorder, unspecified: Secondary | ICD-10-CM

## 2024-03-18 ENCOUNTER — Encounter (HOSPITAL_COMMUNITY): Payer: Self-pay | Admitting: Licensed Clinical Social Worker

## 2024-03-18 ENCOUNTER — Encounter: Payer: Self-pay | Admitting: Internal Medicine

## 2024-03-18 DIAGNOSIS — M19011 Primary osteoarthritis, right shoulder: Secondary | ICD-10-CM | POA: Diagnosis not present

## 2024-03-18 NOTE — Progress Notes (Signed)
 Virtual virtual Video Note  I connected with Alex [name] on 03/17/2024 at 2:00-3:00pm EST by video-enabled virtual visit. I verified that I am speaking with the correct person using  two identifiers.I discussed the limitations of evaluation and management by telemedicine and the availability of in person appointments. The patient expressed understanding and agreed to proceed.    LOCATION: Patient: Alex [name]  Provider: Home office   History of Present Illness:  Pt was referred by Dr. Arfeen for OP therapy for bipolar disorder and anxiety.  Treatment Goal Addressed:  Pt will meet with clinician weekly for therapy to monitor for progress towards goals and address any barriers to success; Reduce depression from average severity level of 6/10 down to a 4/10 in next 6 months by engaging in 1-2 positive coping skills daily as part of developing self-care routine; Reduce average anxiety level from 7/10 down to 5/10 in next 6 months by utilizing 1-2 relaxation skills/grounding skills per day, such as mindful breathing, progressive muscle relaxation, positive visualizations.  Progress towards Treatment Goal: Progressing   Observations/Objective: Patient presented for today'[name] session on time and was alert, oriented x5, with no evidence or self-report of SI/HI or A/V H.  Patient reported ongoing compliance with medication and denied any use of alcohol  or illicit substances. Clinician inquired about patient'[name] current emotional ratings, as well as any significant changes in thoughts, feelings or behavior since previous session. Patient reported scores of  8/10 for depression, 8/10 for anxiety, 3/10 for anger/irritability. [name] and pt explored his emotional ratings, and discussed coping skills for his stressors. Pt identifies stressors and  allowed pt to explore and express thoughts and feelings associated with recent life situations and external stressors. [name] and pt reviewed his continued stressors individually:  marital issues, unfair division of martial finances, son'[name] mental health, physical health, future, [name] sexual health after prostate cancer, lack of marital communication. We just got back from [name], visiting our daughter and grandson'[name] birthday. [name] asked open ended questions. Pt reports on his visit with neurologist. [name] and pt reviewed the report from his neurologist, where she is requesting he talk to his psychiatrist about some of his medications. [name] and pt reviewed alternatives to current medications.    Assessment and plan: Counselor will continue to meet with patient to address treatment plan goals. Patient will continue to follow recommendations of providers and imnd implement skills learned in session, and practice between sessions. Diagnosis: Bipolar 1 disorder.    Collaboration of care: Other:   Continue working with providers    Patient/Guardian was advised Release of Information must be obtained prior to any record release in order to collaborate their care with an outside provider. Patient/Guardian was advised if they have not already done so to contact the registration department to sign all necessary forms in order for us  to release information regarding their care.    Consent: Patient/Guardian gives verbal consent for treatment and assignment of benefits for services provided during this visit. Patient/Guardian expressed understanding and agreed to proceed.     Follow Up Instructions:  I discussed the assessment and treatment plan with the patient. The patient was provided an opportunity to ask questions and all were answered. The patient agreed with the plan and demonstrated an understanding of the instructions.   The patient was advised to call back or seek an in-person evaluation if the symptoms worsen or if the condition fails to improve as anticipated.  I provided 60 minutes of non-face-to-face time during this encounter.  Alex [name], LCAS 03/17/24

## 2024-03-30 ENCOUNTER — Telehealth: Payer: Self-pay

## 2024-03-30 ENCOUNTER — Encounter: Payer: Self-pay | Admitting: Physician Assistant

## 2024-03-30 DIAGNOSIS — D485 Neoplasm of uncertain behavior of skin: Secondary | ICD-10-CM

## 2024-03-30 NOTE — Telephone Encounter (Signed)
 Copied from CRM [id number]. Topic: Referral - Request for Referral >> Mar 30, 2024 12:03 PM Berneda FALCON wrote: Did the patient discuss referral with their provider in the last year? No (If No - schedule appointment) (If Yes - send message)  Appointment offered? No, but states he just wants the referral.  Type of order/referral and detailed reason for visit: Pt would like to have a dermatology appt  Preference of office, provider, location: No preference, just [facility] if possible, but will go elsewhere if needed.  If referral order, have you been seen by this specialty before? Yes (If Yes, this issue or another issue? When? Where? Not from [facility], but it was on [address] and had something removed from lip.  Can we respond through MyChart? Yes

## 2024-03-31 ENCOUNTER — Ambulatory Visit (INDEPENDENT_AMBULATORY_CARE_PROVIDER_SITE_OTHER): Admitting: Licensed Clinical Social Worker

## 2024-03-31 ENCOUNTER — Ambulatory Visit (HOSPITAL_BASED_OUTPATIENT_CLINIC_OR_DEPARTMENT_OTHER)
Admission: RE | Admit: 2024-03-31 | Discharge: 2024-03-31 | Disposition: A | Discharge status: Home / Self Care | Source: Ambulatory Visit | Attending: Cardiology | Admitting: Cardiology

## 2024-03-31 DIAGNOSIS — I7121 Aneurysm of the ascending aorta, without rupture: Secondary | ICD-10-CM | POA: Diagnosis not present

## 2024-03-31 DIAGNOSIS — F319 Bipolar disorder, unspecified: Secondary | ICD-10-CM | POA: Diagnosis not present

## 2024-03-31 DIAGNOSIS — I77810 Thoracic aortic ectasia: Secondary | ICD-10-CM | POA: Diagnosis not present

## 2024-03-31 MED ORDER — IOHEXOL 350 MG/ML SOLN
100.0000 mL | Freq: Once | INTRAVENOUS | Status: AC | PRN
  Administered 2024-03-31: 80 mL via INTRAVENOUS

## 2024-04-01 ENCOUNTER — Ambulatory Visit (INDEPENDENT_AMBULATORY_CARE_PROVIDER_SITE_OTHER): Admitting: Internal Medicine

## 2024-04-01 ENCOUNTER — Encounter: Payer: Self-pay | Admitting: Internal Medicine

## 2024-04-01 VITALS — BP 120/70 | HR 70 | Temp 98.1°F | Ht 72.0 in | Wt 181.0 lb

## 2024-04-01 DIAGNOSIS — K224 Dyskinesia of esophagus: Secondary | ICD-10-CM

## 2024-04-01 DIAGNOSIS — R413 Other amnesia: Secondary | ICD-10-CM

## 2024-04-01 DIAGNOSIS — K2100 Gastro-esophageal reflux disease with esophagitis, without bleeding: Secondary | ICD-10-CM | POA: Diagnosis not present

## 2024-04-01 DIAGNOSIS — D508 Other iron deficiency anemias: Secondary | ICD-10-CM | POA: Diagnosis not present

## 2024-04-01 DIAGNOSIS — C61 Malignant neoplasm of prostate: Secondary | ICD-10-CM

## 2024-04-01 DIAGNOSIS — K219 Gastro-esophageal reflux disease without esophagitis: Secondary | ICD-10-CM | POA: Insufficient documentation

## 2024-04-01 MED ORDER — PANTOPRAZOLE SODIUM 40 MG PO TBEC: 40.0000 mg | DELAYED_RELEASE_TABLET | Freq: Every day | ORAL | 11 refills | Status: AC

## 2024-04-01 MED ORDER — NITROGLYCERIN 0.4 MG/SPRAY TL SOLN: 1.0000 | 12 refills | Status: AC | PRN

## 2024-04-01 NOTE — Assessment & Plan Note (Addendum)
 PSA is pending w/his Urologist

## 2024-04-01 NOTE — Assessment & Plan Note (Signed)
 Seeing Alex [name]. Tests are pending D/c Omeprazole Start Protonix 

## 2024-04-01 NOTE — Assessment & Plan Note (Signed)
 Continue on Protonix

## 2024-04-01 NOTE — Assessment & Plan Note (Signed)
F/u w/Dr Marin Olp

## 2024-04-01 NOTE — Assessment & Plan Note (Signed)
 D/c Omeprazole Start Protonix 

## 2024-04-01 NOTE — Progress Notes (Signed)
 Subjective:  Patient ID: Alex [name], male    DOB: 20-Aug-1955  Age: 68 y.o. MRN: [id number]  CC: Follow-up (Patient here for medication f/u. Patient states his middle finger on his right hand has a weird growth that he wants looked at. He mentioned he his having UTI symptoms that started 4 days ago )   HPI Teaghan Formica [facility] presents for CAD, dyslipidemia, LBP - s/p RF treatment, prostate cancer  Outpatient Medications Prior to Visit  Medication Sig Dispense Refill   acetaminophen  (TYLENOL ) 500 MG tablet Take by mouth.     amLODipine  (NORVASC ) 5 MG tablet Take 1 tablet (5 mg total) by mouth daily. 90 tablet 3   aspirin  EC 81 MG tablet Take 1 tablet by mouth daily.     atorvastatin  (LIPITOR) 40 MG tablet TAKE 1 TABLET BY MOUTH EVERY DAY 90 tablet 1   AXIRON  30 MG/ACT SOLN Place 2 Act onto the skin daily.     brimonidine (ALPHAGAN) 0.2 % ophthalmic solution SMARTSIG:In Eye(s)     Coenzyme Q10 (CO Q 10 PO) Take 1 capsule by mouth daily.      EPINEPHrine  0.3 mg/0.3 mL IJ SOAJ injection Inject 0.3 mg into the muscle as needed.     erythromycin ophthalmic ointment Place into the right eye at bedtime.     furosemide  (LASIX ) 20 MG tablet TAKE 1 TO 2 TABLETS BY MOUTH DAILY AS NEEDED 180 tablet 1   Immune Globulin, Human, 4 GM/20ML SOLN Inject into the skin once a week. wednesday     LamoTRIgine  300 MG TB24 24 hour tablet Take 1 tablet (300 mg total) by mouth every morning. 90 tablet 0   latanoprost  (XALATAN ) 0.005 % ophthalmic solution 1 DROP EACH EYE AT NIGHT     levothyroxine  (SYNTHROID ) 150 MCG tablet TAKE 1 TABLET BY MOUTH DAILY BEFORE BREAKFAST. 90 tablet 2   meloxicam  (MOBIC ) 15 MG tablet TAKE 1 TABLET BY MOUTH EVERY DAY AS NEEDED 90 tablet 0   Multiple Vitamin (MULTI-VITAMINS) TABS Take by mouth.     OMEGA-3 KRILL OIL PO Take 1 capsule by mouth daily.      sildenafil  (REVATIO ) 20 MG tablet Take 1 tablet (20 mg total) by mouth as needed. 30 tablet 1   tadalafil  (CIALIS ) 5 MG tablet  Take 5 mg by mouth.     tamsulosin  (FLOMAX ) 0.4 MG CAPS capsule TAKE 1 CAPSULE BY MOUTH EVERY DAY (Patient taking differently: as needed.) 90 capsule 1   telmisartan  (MICARDIS ) 80 MG tablet Take 1 tablet (80 mg total) by mouth daily. 90 tablet 2   timolol  (TIMOPTIC ) 0.5 % ophthalmic solution timolol  maleate 0.5 % eye drops  INSTILL 1 DROP INTO RIGHT EYE TWICE A DAY     traMADol  (ULTRAM ) 50 MG tablet Take 1 tablet (50 mg total) by mouth every 6 (six) hours as needed for moderate pain (pain score 4-6). 12 tablet 0   zolpidem  (AMBIEN ) 10 MG tablet TAKE 1/2 (ONE-HALF) TABLET BY MOUTH AT BEDTIME AND  TAKE  THE  OTHER  HALF  IF  NEEDED 30 tablet 0   omeprazole (PRILOSEC) 20 MG capsule Take 20 mg by mouth every evening.     No facility-administered medications prior to visit.    ROS: Review of Systems  Constitutional:  Positive for fatigue. Negative for appetite change and unexpected weight change.  HENT:  Negative for congestion, nosebleeds, sneezing, sore throat and trouble swallowing.   Eyes:  Negative for itching and visual disturbance.  Respiratory:  Negative for cough.   Cardiovascular:  Negative for chest pain, palpitations and leg swelling.  Gastrointestinal:  Negative for abdominal distention, blood in stool, diarrhea and nausea.  Genitourinary:  Negative for frequency and hematuria.  Musculoskeletal:  Positive for arthralgias, back pain and neck stiffness. Negative for gait problem, joint swelling and neck pain.  Skin:  Negative for color change and rash.  Neurological:  Negative for dizziness, tremors, speech difficulty and weakness.  Psychiatric/Behavioral:  Positive for decreased concentration, dysphoric mood and sleep disturbance. Negative for agitation and suicidal ideas. The patient is nervous/anxious.     Objective:  BP 120/70 (BP Location: Left Arm, Patient Position: Sitting, Cuff Size: Normal)   Pulse 70   Temp 98.1 F (36.7 C) (Oral)   Ht 6' (1.829 m)   Wt 181 lb (82.1  kg)   SpO2 98%   BMI 24.55 kg/m   BP Readings from Last 3 Encounters:  04/01/24 120/70  03/11/24 123/80  02/26/24 99/71    Wt Readings from Last 3 Encounters:  04/01/24 181 lb (82.1 kg)  03/11/24 185 lb (83.9 kg)  02/26/24 181 lb (82.1 kg)    Physical Exam Constitutional:      General: He is not in acute distress.    Appearance: Normal appearance. He is well-developed.     Comments: NAD  Eyes:     Conjunctiva/sclera: Conjunctivae normal.     Pupils: Pupils are equal, round, and reactive to light.  Neck:     Thyroid : No thyromegaly.     Vascular: No JVD.  Cardiovascular:     Rate and Rhythm: Normal rate and regular rhythm.     Heart sounds: Normal heart sounds. No murmur heard.    No friction rub. No gallop.  Pulmonary:     Effort: Pulmonary effort is normal. No respiratory distress.     Breath sounds: Normal breath sounds. No wheezing or rales.  Chest:     Chest wall: No tenderness.  Abdominal:     General: Bowel sounds are normal. There is no distension.     Palpations: Abdomen is soft. There is no mass.     Tenderness: There is no abdominal tenderness. There is no guarding or rebound.  Musculoskeletal:        General: Tenderness present. Normal range of motion.     Cervical back: Normal range of motion.     Right lower leg: No edema.     Left lower leg: No edema.  Lymphadenopathy:     Cervical: No cervical adenopathy.  Skin:    General: Skin is warm and dry.     Findings: No rash.  Neurological:     Mental Status: He is alert and oriented to person, place, and time.     Cranial Nerves: No cranial nerve deficit.     Motor: No abnormal muscle tone.     Coordination: Coordination normal.     Gait: Gait abnormal.     Deep Tendon Reflexes: Reflexes are normal and symmetric.  Psychiatric:        Behavior: Behavior normal.        Thought Content: Thought content normal.        Judgment: Judgment normal.   Face scars post-op  Lab Results  Component Value  Date   WBC 8.1 08/26/2023   HGB 14.4 08/26/2023   HCT 44.6 08/26/2023   PLT 293.0 08/26/2023   GLUCOSE 92 08/26/2023   CHOL 186 08/26/2023   TRIG 263.0 (H) 08/26/2023  HDL 60.60 08/26/2023   LDLDIRECT 59.0 02/02/2016   LDLCALC 72 08/26/2023   ALT 36 08/26/2023   AST 32 08/26/2023   NA 137 08/26/2023   K 4.3 08/26/2023   CL 102 08/26/2023   CREATININE 1.07 08/26/2023   BUN 22 08/26/2023   CO2 24 08/26/2023   TSH 7.92 (H) 03/11/2024   PSA 3.52 08/26/2023   INR 1.0 09/13/2019   HGBA1C 6.1 01/02/2024    No results found.  Assessment & Plan:   Problem List Items Addressed This Visit     Esophageal spasm - Primary   Continue on Protonix       GERD (gastroesophageal reflux disease)   D/c Omeprazole Start Protonix       Relevant Medications   pantoprazole  (PROTONIX ) 40 MG tablet   Iron  deficiency anemia   F/u w/Dr Timmy      Memory changes   Seeing Lauraine Sevin. Tests are pending D/c Omeprazole Start Protonix       Prostate cancer (HCC)   PSA is pending w/his Urologist          Meds ordered this encounter  Medications   pantoprazole  (PROTONIX ) 40 MG tablet    Sig: Take 1 tablet (40 mg total) by mouth daily.    Dispense:  90 tablet    Refill:  11   nitroGLYCERIN  (NITROLINGUAL ) 0.4 MG/SPRAY spray    Sig: Place 1 spray under the tongue every 5 (five) minutes x 3 doses as needed for chest pain. Esophageal spasms    Dispense:  12 g    Refill:  12      Follow-up: Return in about 3 months (around 07/02/2024) for a follow-up visit.  Marolyn Noel, MD

## 2024-04-02 ENCOUNTER — Ambulatory Visit
Admission: RE | Admit: 2024-04-02 | Discharge: 2024-04-02 | Disposition: A | Discharge status: Home / Self Care | Source: Ambulatory Visit | Attending: Physician Assistant | Admitting: Physician Assistant

## 2024-04-02 DIAGNOSIS — R413 Other amnesia: Secondary | ICD-10-CM | POA: Diagnosis not present

## 2024-04-02 MED ORDER — GADOPICLENOL 0.5 MMOL/ML IV SOLN
8.0000 mL | Freq: Once | INTRAVENOUS | Status: AC | PRN
  Administered 2024-04-02: 8 mL via INTRAVENOUS

## 2024-04-04 ENCOUNTER — Encounter (HOSPITAL_COMMUNITY): Payer: Self-pay | Admitting: Licensed Clinical Social Worker

## 2024-04-04 NOTE — Progress Notes (Signed)
 Virtual virtual Video Note  I connected with Alex [name] on 03/31/2024 at 12:00-1:00pm EST by video-enabled virtual visit. I verified that I am speaking with the correct person using  two identifiers.I discussed the limitations of evaluation and management by telemedicine and the availability of in person appointments. The patient expressed understanding and agreed to proceed.    LOCATION: Patient: Home  Provider: Home office   History of Present Illness:  Pt was referred by Dr. Arfeen for OP therapy for bipolar disorder and anxiety.  Treatment Goal Addressed:  Pt will meet with clinician weekly for therapy to monitor for progress towards goals and address any barriers to success; Reduce depression from average severity level of 6/10 down to a 4/10 in next 6 months by engaging in 1-2 positive coping skills daily as part of developing self-care routine; Reduce average anxiety level from 7/10 down to 5/10 in next 6 months by utilizing 1-2 relaxation skills/grounding skills per day, such as mindful breathing, progressive muscle relaxation, positive visualizations.  Progress towards Treatment Goal: Progressing   Observations/Objective: Patient presented for today's session on time and was alert, oriented x5, with no evidence or self-report of SI/HI or A/V H.  Patient reported ongoing compliance with medication and denied any use of alcohol  or illicit substances. Clinician inquired about patient's current emotional ratings, as well as any significant changes in thoughts, feelings or behavior since previous session. Patient reported scores of  7/10 for depression, 7/10 for anxiety, 3/10 for anger/irritability. [name] and pt explored his emotional ratings, and discussed coping skills for his stressors. Pt identifies stressors and  allowed pt to explore and express thoughts and feelings associated with recent life situations and external stressors. [name] and pt reviewed his continued stressors individually:  marital issues, unfair division of martial finances, son's mental health, physical health, future, pt's sexual health after prostate cancer, lack of marital communication. We just got back from visiting family in [place] . It was a good time and my wife and I got along pretty well. [name] asked open ended questions. Pt reports he has some upcoming dr appts, CT scan. Clinician used CBT to assist pt in processing thoughts, feelings, and behaviors Due to his continued ongoing stressors.  Assessment and plan: Counselor will continue to meet with patient to address treatment plan goals. Patient will continue to follow recommendations of providers and imnd implement skills learned in session, and practice between sessions. Diagnosis: Bipolar 1 disorder.    Collaboration of care: Other:   Continue working with providers    Patient/Guardian was advised Release of Information must be obtained prior to any record release in order to collaborate their care with an outside provider. Patient/Guardian was advised if they have not already done so to contact the registration department to sign all necessary forms in order for us  to release information regarding their care.    Consent: Patient/Guardian gives verbal consent for treatment and assignment of benefits for services provided during this visit. Patient/Guardian expressed understanding and agreed to proceed.     Follow Up Instructions:  I discussed the assessment and treatment plan with the patient. The patient was provided an opportunity to ask questions and all were answered. The patient agreed with the plan and demonstrated an understanding of the instructions.   The patient was advised to call back or seek an in-person evaluation if the symptoms worsen or if the condition fails to improve as anticipated.  I provided 60 minutes of non-face-to-face time during this encounter.  Alex [name] S, LCAS 03/31/24

## 2024-04-05 NOTE — Telephone Encounter (Signed)
 Okay.  Thanks.

## 2024-04-05 NOTE — Addendum Note (Signed)
 Addended by: Lasean Gorniak V on: 04/05/2024 07:19 AM   Modules accepted: Orders

## 2024-04-07 ENCOUNTER — Ambulatory Visit (INDEPENDENT_AMBULATORY_CARE_PROVIDER_SITE_OTHER): Admitting: Licensed Clinical Social Worker

## 2024-04-07 DIAGNOSIS — F319 Bipolar disorder, unspecified: Secondary | ICD-10-CM | POA: Diagnosis not present

## 2024-04-07 DIAGNOSIS — M12572 Traumatic arthropathy, left ankle and foot: Secondary | ICD-10-CM | POA: Diagnosis not present

## 2024-04-07 DIAGNOSIS — M19072 Primary osteoarthritis, left ankle and foot: Secondary | ICD-10-CM | POA: Diagnosis not present

## 2024-04-07 DIAGNOSIS — M25572 Pain in left ankle and joints of left foot: Secondary | ICD-10-CM | POA: Diagnosis not present

## 2024-04-08 ENCOUNTER — Ambulatory Visit: Payer: Self-pay | Admitting: Cardiology

## 2024-04-08 ENCOUNTER — Encounter (HOSPITAL_COMMUNITY): Payer: Self-pay | Admitting: Licensed Clinical Social Worker

## 2024-04-08 DIAGNOSIS — I7121 Aneurysm of the ascending aorta, without rupture: Secondary | ICD-10-CM

## 2024-04-08 NOTE — Progress Notes (Signed)
 Virtual virtual Video Note  I connected with Alex [name] on 04/07/2024 at  3-4pm EST by video-enabled virtual visit. I verified that I am speaking with the correct person using  two identifiers.I discussed the limitations of evaluation and management by telemedicine and the availability of in person appointments. The patient expressed understanding and agreed to proceed.    LOCATION: Patient: Home  Provider: Home office   History of Present Illness:  Pt was referred by Dr. Arfeen for OP therapy for bipolar disorder and anxiety.  Treatment Goal Addressed:  Pt will meet with clinician weekly for therapy to monitor for progress towards goals and address any barriers to success; Reduce depression from average severity level of 6/10 down to a 4/10 in next 6 months by engaging in 1-2 positive coping skills daily as part of developing self-care routine; Reduce average anxiety level from 7/10 down to 5/10 in next 6 months by utilizing 1-2 relaxation skills/grounding skills per day, such as mindful breathing, progressive muscle relaxation, positive visualizations.  Progress towards Treatment Goal: Progressing   Observations/Objective: Patient presented for today's session on time and was alert, oriented x5, with no evidence or self-report of SI/HI or A/V H.  Patient reported ongoing compliance with medication and denied any use of alcohol  or illicit substances. Clinician inquired about patient's current emotional ratings, as well as any significant changes in thoughts, feelings or behavior since previous session. Patient reported scores of  7/10 for depression, 7/10 for anxiety, 3/10 for anger/irritability. [name] and pt explored his emotional ratings, and discussed coping skills for his stressors. Pt identifies stressors and  allowed pt to explore and express thoughts and feelings associated with recent life situations and external stressors. 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 [name] asked open ended questions. [name] and pt discussed him socializing with other older adults. [name] told pt about the [facility] activities. [name] provided psychoeducation on Erikson's Stages of Development- final stage, explaining the final stage of development: despair vs integrity.     Assessment and plan: Counselor will continue to meet with patient to address treatment plan goals. Patient will continue to follow recommendations of providers and imnd implement skills learned in session, and practice between sessions. Diagnosis: Bipolar 1 disorder.    Collaboration of care: Other:   Continue working with providers    Patient/Guardian was advised Release of Information must be obtained prior to any record release in order to collaborate their care with an outside provider. Patient/Guardian was advised if they have not already done so to contact the registration department to sign all necessary forms in order for us  to release information regarding their care.    Consent: Patient/Guardian gives verbal consent for treatment and assignment of benefits for services provided during this visit. Patient/Guardian expressed understanding and agreed to proceed.     Follow Up Instructions:  I discussed the assessment and treatment plan with the patient. The patient was provided an opportunity to ask questions and all were answered. The patient agreed with the plan and demonstrated an understanding of the instructions.   The patient was advised to call back or seek an in-person evaluation if the symptoms worsen or if the condition fails to improve as anticipated.  I provided 60 minutes of non-face-to-face time during this encounter.   Destony Prevost S, LCAS 04/07/24

## 2024-04-13 ENCOUNTER — Encounter (HOSPITAL_COMMUNITY): Payer: Self-pay | Admitting: Licensed Clinical Social Worker

## 2024-04-13 ENCOUNTER — Ambulatory Visit (INDEPENDENT_AMBULATORY_CARE_PROVIDER_SITE_OTHER): Admitting: Licensed Clinical Social Worker

## 2024-04-13 ENCOUNTER — Encounter (HOSPITAL_BASED_OUTPATIENT_CLINIC_OR_DEPARTMENT_OTHER): Payer: Self-pay

## 2024-04-13 ENCOUNTER — Ambulatory Visit: Admitting: Cardiology

## 2024-04-13 DIAGNOSIS — L570 Actinic keratosis: Secondary | ICD-10-CM | POA: Diagnosis not present

## 2024-04-13 DIAGNOSIS — F319 Bipolar disorder, unspecified: Secondary | ICD-10-CM

## 2024-04-13 DIAGNOSIS — L738 Other specified follicular disorders: Secondary | ICD-10-CM | POA: Diagnosis not present

## 2024-04-13 DIAGNOSIS — D1801 Hemangioma of skin and subcutaneous tissue: Secondary | ICD-10-CM | POA: Diagnosis not present

## 2024-04-13 DIAGNOSIS — L821 Other seborrheic keratosis: Secondary | ICD-10-CM | POA: Diagnosis not present

## 2024-04-13 DIAGNOSIS — Z08 Encounter for follow-up examination after completed treatment for malignant neoplasm: Secondary | ICD-10-CM | POA: Diagnosis not present

## 2024-04-13 NOTE — Progress Notes (Signed)
 Virtual virtual Video Note  I connected with Alex [name] on 04/13/2024 at  1-2pm EST by video-enabled virtual visit. I verified that I am speaking with the correct person using  two identifiers.I discussed the limitations of evaluation and management by telemedicine and the availability of in person appointments. The patient expressed understanding and agreed to proceed.    LOCATION: Patient: Home  Provider: Home office   History of Present Illness:  Pt was referred by Dr. Arfeen for OP therapy for bipolar disorder and anxiety.  Treatment Goal Addressed:  Pt will meet with clinician weekly for therapy to monitor for progress towards goals and address any barriers to success; Reduce depression from average severity level of 6/10 down to a 4/10 in next 6 months by engaging in 1-2 positive coping skills daily as part of developing self-care routine; Reduce average anxiety level from 7/10 down to 5/10 in next 6 months by utilizing 1-2 relaxation skills/grounding skills per day, such as mindful breathing, progressive muscle relaxation, positive visualizations.  Progress towards Treatment Goal: Progressing   Observations/Objective: Patient presented for today's session on time and was alert, oriented x5, with no evidence or self-report of SI/HI or A/V H.  Patient reported ongoing compliance with medication and denied any use of alcohol  or illicit substances. Clinician inquired about patient's current emotional ratings, as well as any significant changes in thoughts, feelings or behavior since previous session. Patient reported scores of  7/10 for depression, 7/10 for anxiety, 3/10 for anger/irritability. [name] and pt explored his emotional ratings, and discussed coping skills for his stressors. Pt identifies stressors and  allowed pt to explore and express thoughts and feelings associated with recent life situations and external stressors. 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 [name] asked open ended questions. I've been researching my daughter's overdose death and talking to the [employer] unit trying to get answers about who supplied her drugs. [name] asked open ended questions suggesting to pt tread carefully as he may open up old wounds of grief. [name] provided additional education on the cycle of grief.     Assessment and plan: Counselor will continue to meet with patient to address treatment plan goals. Patient will continue to follow recommendations of providers and imnd implement skills learned in session, and practice between sessions. Diagnosis: Bipolar 1 disorder.    Collaboration of care: Other:   Continue working with providers    Patient/Guardian was advised Release of Information must be obtained prior to any record release in order to collaborate their care with an outside provider. Patient/Guardian was advised if they have not already done so to contact the registration department to sign all necessary forms in order for us  to release information regarding their care.    Consent: Patient/Guardian gives verbal consent for treatment and assignment of benefits for services provided during this visit. Patient/Guardian expressed understanding and agreed to proceed.     Follow Up Instructions:  I discussed the assessment and treatment plan with the patient. The patient was provided an opportunity to ask questions and all were answered. The patient agreed with the plan and demonstrated an understanding of the instructions.   The patient was advised to call back or seek an in-person evaluation if the symptoms worsen or if the condition fails to improve as anticipated.  I provided 60 minutes of non-face-to-face time during this  encounter.   Alex [name] S, LCAS 04/13/24

## 2024-04-13 NOTE — Progress Notes (Signed)
 I called patient,he told me he was in a conference, Could I call back later. I said sure. Will call back later

## 2024-04-13 NOTE — Progress Notes (Addendum)
 Cardiology Office Note    Patient Name: Alex [name] [facility] Date of Encounter: 04/13/2024  Primary Care Provider:  Garald Karlynn GAILS, MD Primary Cardiologist:  Redell Shallow, MD Primary Electrophysiologist: None   Past Medical History    Past Medical History:  Diagnosis Date   Anemia    Anxiety    Atypical nevus 04/12/1997   dyplastic-left chest below nipple   Atypical nevus 01/18/2005   slight-mod-mid upper abd, slight-mod-right lateral chest-(WS), slight-mod-mid lower back (punch)   Atypical nevus 05/31/2005   dysplastic-central lowerback (exc), dysplastic- right abdomen (Exc)   Basal cell carcinoma 06/04/2016   back of neck   Bipolar I disorder (HCC)    Bleeding ulcer 2016   BPH (benign prostatic hypertrophy) with urinary obstruction    Cancer (HCC)    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 Maude Halt, oncology. Receives monthly IVIG 40 grams.   Depression    Epicondylitis    right elbow   GERD (gastroesophageal reflux disease)    Glaucoma BOTH EYES   RIGHT EYE RADIATION DAMAGE   Hearing loss    Bilateral   Hepatic cyst    Several, The lesion of concern in segment 6 of the liver has single large portal vein and hepatic vein branches extending to tt, in a pattern of enhancement which mirrors these vascular structures. 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 9 CM X 4. 3 CM PER NOV 06-14-17  CHEST CTFOLOWED BY DR SHALLOW YEARLY FOR   Positional vertigo    HX OF WITH SINUS INFECTIONS   Prostate cancer (HCC)    Follows w/ Dr. Debby Polascik @ [facility].   Radial head fracture    Right   Squamous cell carcinoma of skin 06/04/2016   in situ-crown of scalp   Squamous cell carcinoma of skin 04/22/2017   in situ-crown scalp (txpbx)   Thoracic aortic aneurysm (HCC) 06/14/2017   last CT 4.1 CM  Mild   Tinnitus    CONSTANT   Ulnar nerve compression    right elbow   Unsteady gait    especially with stairs, depth perception off   Urinary hesitancy    Wears glasses     History of Present Illness  Alex [name] is a 68 y.o. male with PMH of HTN, HLD nonobstructive CAD following LHC in 2007, dilated ascending aorta 43 mm, thyroid  cancer s/p thyroidectomy, BPH, GERD, presents today for follow-up.  [name] was last seen by Dr. Pietro on 04/01/2023 following coronary CTA in August that showed 4.2 cm ascending aortic aneurysm and calcium  score of 16.7.  He also reported dyspnea with more extreme activities and no associated chest pain.  He was continued on current medications at that time with no changes to therapy.  He was advised to  repeat CT angio of the chest in 1 year.  He underwent excision of a cystic mass of the right shoulder on 09/04/2023.  [name] presents today for annual follow-up.  Since his previous visit he reports no chest pain is reported, but he experiences shortness of breath with extreme activities. His heart rate increases with activity but returns to normal quickly. He had a surveillance CTA of the chest last year which showed a stable dilation of an aneurysm at 4.3 cm, slightly increased from 4.2 cm, but still below the 5.5 cm threshold. No significant shortness of breath at rest or new symptoms such as chest pain are reported. He experiences esophageal spasms, which he describes as mimicking a heart attack. He recently had an episode that was alleviated by drinking hot milk and using nitroglycerin  spray under the tongue. No pain radiating to the arms or neck is reported. His lipid profile from January showed elevated triglycerides at 263 mg/dL, with LDL at 72 mg/dL and total cholesterol at 186 mg/dL. He finds following a Mediterranean diet challenging due to cost and dietary habits. He is experiencing memory issues and possible sleep apnea, as his wife reports snoring and episodes where she has to wake him. He is reluctant to use a CPAP machine. He is considering surgeries for his shoulder, spine, and knees due to pain and limited function. He has had injections for knee pain, which have provided some relief. Arthritis affects his physical activity, limiting his ability to exercise despite being a gym member. He uses physical therapy to manage his condition but finds some exercises difficult due to back issues.  Patient denies chest pain, palpitations, dyspnea, PND, orthopnea, nausea, vomiting, dizziness, syncope, edema, weight gain, or early satiety.  Discussed the use of AI scribe software for clinical note transcription with the patient, who gave verbal consent to proceed.  History of Present  Illness   Review of Systems  Please see the history of present illness.    All other systems reviewed and are otherwise negative except as noted above.  Physical Exam    Wt Readings from Last 3 Encounters:  04/01/24 181 lb (82.1 kg)  03/11/24 185 lb (83.9 kg)  02/26/24 181 lb (82.1 kg)   CD:Uyzmz were no vitals filed for this visit.,There is no height or weight on file to calculate BMI. GEN: Well nourished, well developed in no acute distress Neck: No JVD; No carotid bruits Pulmonary: Clear to auscultation without rales, wheezing or rhonchi  Cardiovascular: Normal rate. Regular rhythm. Normal S1. Normal S2.   Murmurs: There is no murmur.  ABDOMEN: Soft, non-tender, non-distended EXTREMITIES:  No edema; No deformity  EKG/LABS/ Recent Cardiac Studies   ECG personally reviewed by me today -sinus rhythm with incomplete left bundle branch block and rate of 76 bpm changes consistent with previous EKG.  Risk Assessment/Calculations:          Lab Results  Component Value Date   WBC 8.1 08/26/2023   HGB 14.4 08/26/2023   HCT 44.6 08/26/2023   MCV 83.4 08/26/2023   PLT 293.0 08/26/2023   Lab Results  Component Value Date   CREATININE 1.07 08/26/2023   BUN 22 08/26/2023   NA 137 08/26/2023   K 4.3 08/26/2023   CL 102 08/26/2023   CO2 24 08/26/2023   Lab Results  Component Value Date   CHOL 186 08/26/2023   HDL 60.60 08/26/2023   LDLCALC 72 08/26/2023   LDLDIRECT 59.0 02/02/2016   TRIG 263.0 (H) 08/26/2023   CHOLHDL 3 08/26/2023    Lab Results  Component Value Date   HGBA1C 6.1 01/02/2024   Assessment & Plan    Assessment & Plan  1.  Coronary artery disease: -Patient reports no chest pain or anginal complaints at this time -Continue aspirin  81 mg daily and atorvastatin  40 mg daily, as needed Nitrostat  0.4 mg  2.  Primary hypertension: - Patient's blood pressure today was well-controlled at 110/80 - Continue Norvasc  5 mg and Micardis  80 mg daily  3.   Ascending aortic aneurysm: Aneurysm stable at 4.3 cm on most recent CT of the chest - Repeat CT scan in one year.  4.  Hyperlipidemia -Managed with Lipitor. LDL 72 mg/dL, total cholesterol 813 mg/dL, triglycerides 736 mg/dL. Discussed dietary modifications to reduce triglycerides. - Encourage dietary modifications to reduce triglycerides. - Consider follow-up if triglycerides remain elevated.  5. Esophageal spasm: Intermittent spasms managed with nitroglycerin  spray. - Continue nitroglycerin  spray as needed for esophageal spasms.  6. Preop Clearance: -Patients RCIR score is 0.4% -Mr. Laduke was able to achieve greater than 4 METS without cardiac limitations. He was also free of any acute cardiac complaints. Therefore, based on ACC/AHA guidelines, the patient would be at acceptable risk for the planned procedure without further cardiovascular testing. The patient was advised that if he develops new symptoms prior to surgery to contact our office to arrange for a follow-up visit, and he verbalized understanding.   Regarding ASA therapy,  it may be stopped 5-7 days prior to surgery with a plan to resume it as soon as felt to be feasible from a surgical standpoint in the post-operative period.  Disposition: Follow-up with Redell Shallow, MD or APP in 12 months    Signed, Wyn Raddle, Jackee Shove, NP 04/13/2024, 8:30 AM [facility]

## 2024-04-14 ENCOUNTER — Encounter: Payer: Self-pay | Admitting: Nurse Practitioner

## 2024-04-14 ENCOUNTER — Telehealth (HOSPITAL_BASED_OUTPATIENT_CLINIC_OR_DEPARTMENT_OTHER): Admitting: Psychiatry

## 2024-04-14 ENCOUNTER — Encounter (HOSPITAL_COMMUNITY): Payer: Self-pay | Admitting: Psychiatry

## 2024-04-14 ENCOUNTER — Ambulatory Visit: Attending: Cardiovascular Disease | Admitting: Nurse Practitioner

## 2024-04-14 VITALS — BP 110/80 | HR 76 | Resp 16 | Ht 72.0 in | Wt 182.6 lb

## 2024-04-14 VITALS — Wt 181.0 lb

## 2024-04-14 DIAGNOSIS — I2510 Atherosclerotic heart disease of native coronary artery without angina pectoris: Secondary | ICD-10-CM

## 2024-04-14 DIAGNOSIS — F411 Generalized anxiety disorder: Secondary | ICD-10-CM

## 2024-04-14 DIAGNOSIS — K224 Dyskinesia of esophagus: Secondary | ICD-10-CM

## 2024-04-14 DIAGNOSIS — F5101 Primary insomnia: Secondary | ICD-10-CM

## 2024-04-14 DIAGNOSIS — F319 Bipolar disorder, unspecified: Secondary | ICD-10-CM

## 2024-04-14 DIAGNOSIS — I10 Essential (primary) hypertension: Secondary | ICD-10-CM

## 2024-04-14 DIAGNOSIS — E785 Hyperlipidemia, unspecified: Secondary | ICD-10-CM | POA: Diagnosis not present

## 2024-04-14 DIAGNOSIS — R413 Other amnesia: Secondary | ICD-10-CM

## 2024-04-14 DIAGNOSIS — I7121 Aneurysm of the ascending aorta, without rupture: Secondary | ICD-10-CM

## 2024-04-14 MED ORDER — LAMOTRIGINE ER 300 MG PO TB24: 1.0000 | ORAL_TABLET | ORAL | 0 refills | Status: DC

## 2024-04-14 NOTE — Progress Notes (Signed)
 [facility] MD Virtual Progress Note   Patient Location: Home Provider Location: Home Office  I connect with patient by video and verified that I am speaking with correct person by using two identifiers. I discussed the limitations of evaluation and management by telemedicine and the availability of in person appointments. I also discussed with the patient that there may be a patient responsible charge related to this service. The patient expressed understanding and agreed to proceed.  Kelly Eisler Tapley [id number] 68 y.o.  04/14/2024 11:46 AM  History of Present Illness:  Patient is evaluated by video session.  He reported a lot of medical health issues and busy with doctors appointments and test results.  He reported had a fall twice and noticed difficulty remembering things.  He has difficulty finding words to express and noticed his balance is also off.  He saw neurology and had a MRI of head shows white matter small vessel disease.  Patient is scheduled to have neurocognitive testing later this month and had appointment coming up to see neurology in October.  He reported right shoulder pain, neuropathy, back pain.  Recently seen cardiologist and continue to receive treatment for his retinopathy.  His TSH is 7.92.  He is seeing PCP, cardiology and neurology.  He feels tired and fatigue.  He is concerned about his memory problem and balance issue.  He is in therapy with Landry Clara.  He is taking Lamictal  which is keeping his mood stable and denies any mania, psychosis, hallucination, paranoia or any suicidal thoughts.  His daughter recommend not to take the Ambien  because of memory issues.  He is concerned because Ambien  had helped him a lot in the past.  He also does not want his memory to get worse.  He denies drinking or using any illegal substances.  Denies any panic attack but feels nervous and anxious overall.  Past Psychiatric History: H/O depression, mood swing, anger and  Inpatient at [facility] due to suicidal thoughts but no attempt.  No h/o psychosis but h/o poor impulse control.  Tried Klonopin  but did not work.  Took Seroquel, Elavil , Trazodone, Doxepin, melatonin, Cymbalta, BuSpar  and Remeron  (increase depression)    Past Medical History:  Diagnosis Date   Anemia    Anxiety    Atypical nevus 04/12/1997   dyplastic-left chest below nipple   Atypical nevus 01/18/2005   slight-mod-mid upper abd, slight-mod-right lateral chest-(WS), slight-mod-mid lower back (punch)   Atypical nevus 05/31/2005   dysplastic-central lowerback (exc), dysplastic- right abdomen (Exc)   Basal cell carcinoma 06/04/2016   back of neck   Bipolar I disorder (HCC)    Bleeding ulcer 2016   BPH (benign prostatic hypertrophy) with urinary obstruction    Cancer (HCC)    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 Maude Halt, oncology. Receives monthly IVIG 40 grams.   Depression    Epicondylitis    right elbow   GERD (gastroesophageal reflux disease)    Glaucoma BOTH  EYES   RIGHT EYE RADIATION DAMAGE   Hearing loss    Bilateral   Hepatic cyst    Several, The lesion of concern in segment 6 of the liver has single large portal vein and hepatic vein branches extending to tt, in a pattern of enhancement which mirrors these vascular structures. 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 9 CM X 4. 3 CM PER NOV 06-14-17  CHEST CTFOLOWED BY DR PIETRO YEARLY FOR   Positional vertigo    HX OF WITH SINUS INFECTIONS   Prostate cancer (HCC)    Follows w/ Dr. Debby Polascik @ [facility].   Radial head fracture    Right   Squamous cell carcinoma of skin 06/04/2016   in situ-crown of scalp   Squamous cell carcinoma of skin 04/22/2017   in situ-crown scalp (txpbx)   Thoracic aortic aneurysm (HCC) 06/14/2017   last CT 4.1 CM Mild   Tinnitus    CONSTANT   Ulnar nerve compression    right elbow   Unsteady gait    especially with stairs, depth perception off   Urinary hesitancy    Wears glasses     Outpatient Encounter Medications as of 04/14/2024  Medication Sig   acetaminophen  (TYLENOL ) 500 MG tablet Take by mouth.   amLODipine  (NORVASC ) 5 MG tablet Take 1 tablet (5 mg total) by mouth daily.   aspirin  EC 81 MG tablet Take 1 tablet by mouth daily.   atorvastatin  (LIPITOR) 40 MG tablet TAKE 1 TABLET BY MOUTH EVERY DAY   AXIRON  30 MG/ACT SOLN Place 2 Act onto the skin daily.   brimonidine (ALPHAGAN) 0.2 % ophthalmic solution SMARTSIG:In Eye(s)   Coenzyme Q10 (CO Q 10 PO) Take 1 capsule by mouth daily.    EPINEPHrine  0.3 mg/0.3 mL IJ SOAJ injection Inject 0.3 mg  into the muscle as needed.   erythromycin ophthalmic ointment Place into the right eye at bedtime.   furosemide  (LASIX ) 20 MG tablet TAKE 1 TO 2 TABLETS BY MOUTH DAILY AS NEEDED   Immune Globulin, Human, 4 GM/20ML SOLN Inject into the skin once a week. wednesday   LamoTRIgine  300 MG TB24 24 hour tablet Take 1 tablet (300 mg total) by mouth every morning.   latanoprost  (XALATAN ) 0.005 % ophthalmic solution 1 DROP EACH EYE AT NIGHT   levothyroxine  (SYNTHROID ) 150 MCG tablet TAKE 1 TABLET BY MOUTH DAILY BEFORE BREAKFAST.   meloxicam  (MOBIC ) 15 MG tablet TAKE 1 TABLET BY  MOUTH EVERY DAY AS NEEDED   Multiple Vitamin (MULTI-VITAMINS) TABS Take by mouth.   nitroGLYCERIN  (NITROLINGUAL ) 0.4 MG/SPRAY spray Place 1 spray under the tongue every 5 (five) minutes x 3 doses as needed for chest pain. Esophageal spasms   OMEGA-3 KRILL OIL PO Take 1 capsule by mouth daily.    pantoprazole  (PROTONIX ) 40 MG tablet Take 1 tablet (40 mg total) by mouth daily.   sildenafil  (REVATIO ) 20 MG tablet Take 1 tablet (20 mg total) by mouth as needed.   tadalafil  (CIALIS ) 5 MG tablet Take 5 mg by mouth.   tamsulosin  (FLOMAX ) 0.4 MG CAPS capsule TAKE 1 CAPSULE BY MOUTH EVERY DAY (Patient taking differently: as needed.)   telmisartan  (MICARDIS ) 80 MG tablet Take 1 tablet (80 mg total) by mouth daily.   timolol  (TIMOPTIC ) 0.5 % ophthalmic solution timolol  maleate 0.5 % eye drops  INSTILL 1 DROP INTO RIGHT EYE TWICE A DAY   traMADol  (ULTRAM ) 50 MG tablet Take 1 tablet (50 mg total) by mouth every 6 (six) hours as needed for moderate pain (pain score 4-6).   zolpidem  (AMBIEN ) 10 MG tablet TAKE 1/2 (ONE-HALF) TABLET BY MOUTH AT BEDTIME AND  TAKE  THE  OTHER  HALF  IF  NEEDED   No facility-administered encounter medications on file as of 04/14/2024.    Recent Results (from the past 2160 hours)  Lab report - scanned     Status: None   Collection Time: 02/18/24  8:22 AM  Result Value Ref Range   EGFR 86.0     Comment: ABSTRACTED  BY HIM  Vitamin D  (25 hydroxy)     Status: None   Collection Time: 03/11/24  9:26 AM  Result Value Ref Range   Vit D, 25-Hydroxy 54 30 - 100 ng/mL    Comment: Vitamin D  Status         25-OH Vitamin D : . Deficiency:                    <20 ng/mL Insufficiency:             20 - 29 ng/mL Optimal:                 > or = 30 ng/mL . For 25-OH Vitamin D  testing on patients on  D2-supplementation and patients for whom quantitation  of D2 and D3 fractions is required, the [facility]ure[facility](TM) 25-OH VIT D, (D2,D3), LC/MS/MS is recommended: order  code 07111 (patients >1yrs). . See Note 1 . Note 1 . For additional information, please refer to  http://education.[facility].com/faq/FAQ199  (This link is being provided for informational/ educational purposes only.)   Vitamin B12     Status: None   Collection Time: 03/11/24  9:26 AM  Result Value Ref Range   Vitamin B-12 875 200 - 1,100 pg/mL  TSH     Status: Abnormal   Collection Time: 03/11/24  9:26 AM  Result Value Ref Range   TSH 7.92 (H) 0.40 - 4.50 mIU/L  T4, free     Status: None   Collection Time: 03/11/24  9:26 AM  Result Value Ref Range   Free T4 1.4 0.8 - 1.8 ng/dL  T3, free     Status: None   Collection Time: 03/11/24  9:26 AM  Result Value Ref Range   T3, Free 2.8 2.3 - 4.2 pg/mL  TEST AUTHORIZATION     Status: None   Collection Time: 03/11/24  9:26 AM  Result Value Ref Range  TEST NAME: T3, FREE T4, FREE    TEST CODE: [id number]    CLIENT CONTACT: WERTMAN    REPORT ALWAYS MESSAGE SIGNATURE      Comment: . The laboratory testing on this patient was verbally requested or confirmed by the ordering physician or his or her authorized representative after contact with an employee of [employer]. Federal regulations require that we maintain on file written authorization for all laboratory testing.  Accordingly we are asking that the ordering physician or his or her authorized representative sign a copy of  this report and promptly return it to the client service representative. . . Signature:____________________________________________________ . Please fax this signed page to [phone] or return it via your [employer] courier.      Psychiatric Specialty Exam: Physical Exam  Review of Systems  Musculoskeletal:  Positive for arthralgias and gait problem.       Right shoulder pain  Neurological:  Positive for numbness.  Psychiatric/Behavioral:  Positive for decreased concentration and sleep disturbance.     Weight 181 lb (82.1 kg).There is no height or weight on file to calculate BMI.  General Appearance: Casual  Eye Contact:  Fair  Speech:  Normal Rate  Volume:  Normal  Mood:  Anxious  Affect:  Congruent  Thought Process:  Goal Directed  Orientation:  Full (Time, Place, and Person)  Thought Content:  Rumination  Suicidal Thoughts:  No  Homicidal Thoughts:  No  Memory:  Immediate;   Fair Recent;   Good Remote;   Fair  Judgement:  Intact  Insight:  Present  Psychomotor Activity:  Decreased  Concentration:  Concentration: Fair and Attention Span: Fair  Recall:  Good  Fund of Knowledge:  Good  Language:  Good  Akathisia:  No  Handed:  Right  AIMS (if indicated):     Assets:  Communication Skills Desire for Improvement Housing Social Support Transportation  ADL's:  Intact  Cognition:  WNL  Sleep:  fair       04/01/2024    2:16 PM 02/26/2024    3:00 PM 01/14/2024    2:12 PM 11/12/2023    2:08 PM 08/20/2023   10:33 AM  Depression screen PHQ 2/9  Decreased Interest 0 0 1 1 0  Down, Depressed, Hopeless 1 0 1 1 0  PHQ - 2 Score 1 0 2 2 0  Altered sleeping    1   Tired, decreased energy    1   Change in appetite    0   Feeling bad or failure about yourself     1   Trouble concentrating    0   Moving slowly or fidgety/restless    0   Suicidal thoughts    0   PHQ-9 Score    5   Difficult doing work/chores    Not difficult at all      Assessment/Plan: Bipolar I disorder (HCC) - Plan: LamoTRIgine  300 MG TB24 24 hour tablet  GAD (generalized anxiety disorder) - Plan: LamoTRIgine  300 MG TB24 24 hour tablet  Primary insomnia - Plan: LamoTRIgine  300 MG TB24 24 hour tablet  Memory impairment - Plan: LamoTRIgine  300 MG TB24 24 hour tablet  Patient is 68 year old married man with history of coronary atherosclerosis, hypertension, aortic aneurysm, retinopathy, GERD, hypothyroidism, chronic pain, arthritis, benign prostate hyperplasia status post prostate cancer, bipolar disorder, generalized anxiety disorder, chronic insomnia and now symptoms of memory impairment and balance issue.  I review collateral information from other providers, blood work results  and brain imaging.  Recommend to discontinue Ambien  due to started to have memory issues.  Discussed hypnotic side effects.  Patient concerned as cannot sleep without Ambien .  Recommend to try melatonin or natural supplements.  Continue Lamictal  300 mg daily to help with the mood symptoms.  Encouraged to keep appointment with neurology to discuss the scan and also consider neurocognitive testing.  Also encouraged to keep the appointment with Landry Clara for coping skills.  Recommend to call back if he has any question or any concern.  Reviewed blood work results.  TSH 7.92.  Patient is going to follow-up with his PCP.  Discussed safety concerns at any time having active suicidal thoughts or homicidal thoughts and he need to call 911 or go to local emergency room.  Will follow-up in 3 months unless patient need a sooner appointment.   Follow Up Instructions:     I discussed the assessment and treatment plan with the patient. The patient was provided an opportunity to ask questions and all were answered. The patient agreed with the plan and demonstrated an understanding of the instructions.   The patient was advised to call back or seek an in-person evaluation if the symptoms worsen  or if the condition fails to improve as anticipated.    Collaboration of Care: Other provider involved in patient's care AEB notes are available in epic to review.  Patient/Guardian was advised Release of Information must be obtained prior to any record release in order to collaborate their care with an outside provider. Patient/Guardian was advised if they have not already done so to contact the registration department to sign all necessary forms in order for us  to release information regarding their care.   Consent: Patient/Guardian gives verbal consent for treatment and assignment of benefits for services provided during this visit. Patient/Guardian expressed understanding and agreed to proceed.     Total encounter time 29 minutes which includes face-to-face time, chart reviewed, care coordination, order entry and documentation during this encounter.   Note: This document was prepared by [employer] voice dictation technology and any errors that results from this process are unintentional.    Leni ONEIDA Client, MD 04/14/2024

## 2024-04-14 NOTE — Patient Instructions (Signed)
 Medication Instructions:  Your physician recommends that you continue on your current medications as directed. Please refer to the Current Medication list given to you today. *If you need a refill on your cardiac medications before your next appointment, please call your pharmacy*  Lab Work: None ordered If you have labs (blood work) drawn today and your tests are completely normal, you will receive your results only by: MyChart Message (if you have MyChart) OR A paper copy in the mail If you have any lab test that is abnormal or we need to change your treatment, we will call you to review the results.  Testing/Procedures: None Ordered  Follow-Up: At [facility], you and your health needs are our priority.  As part of our continuing mission to provide you with exceptional heart care, our providers are all part of one team.  This team includes your primary Cardiologist (physician) and Advanced Practice Providers or APPs (Physician Assistants and Nurse Practitioners) who all work together to provide you with the care you need, when you need it.  Your next appointment:   12 month(s)  Provider:   Redell Shallow, MD    We recommend signing up for the patient portal called MyChart.  Sign up information is provided on this After Visit Summary.  MyChart is used to connect with patients for Virtual Visits (Telemedicine).  Patients are able to view lab/test results, encounter notes, upcoming appointments, etc.  Non-urgent messages can be sent to your provider as well.   To learn more about what you can do with MyChart, go to [email or web address].   Other Instructions

## 2024-04-15 DIAGNOSIS — M79644 Pain in right finger(s): Secondary | ICD-10-CM | POA: Diagnosis not present

## 2024-04-16 ENCOUNTER — Other Ambulatory Visit: Payer: Self-pay | Admitting: Internal Medicine

## 2024-04-16 ENCOUNTER — Other Ambulatory Visit: Payer: Self-pay | Admitting: Physician Assistant

## 2024-04-16 ENCOUNTER — Other Ambulatory Visit: Payer: Self-pay | Admitting: Cardiology

## 2024-04-16 DIAGNOSIS — I10 Essential (primary) hypertension: Secondary | ICD-10-CM

## 2024-04-16 DIAGNOSIS — E7800 Pure hypercholesterolemia, unspecified: Secondary | ICD-10-CM

## 2024-04-16 DIAGNOSIS — I2510 Atherosclerotic heart disease of native coronary artery without angina pectoris: Secondary | ICD-10-CM

## 2024-04-16 MED ORDER — AMLODIPINE BESYLATE 5 MG PO TABS: 5.0000 mg | ORAL_TABLET | Freq: Every day | ORAL | 3 refills | Status: DC

## 2024-04-19 DIAGNOSIS — H3589 Other specified retinal disorders: Secondary | ICD-10-CM | POA: Diagnosis not present

## 2024-04-19 DIAGNOSIS — H353121 Nonexudative age-related macular degeneration, left eye, early dry stage: Secondary | ICD-10-CM | POA: Diagnosis not present

## 2024-04-19 DIAGNOSIS — T66XXXS Radiation sickness, unspecified, sequela: Secondary | ICD-10-CM | POA: Diagnosis not present

## 2024-04-21 ENCOUNTER — Ambulatory Visit (HOSPITAL_BASED_OUTPATIENT_CLINIC_OR_DEPARTMENT_OTHER): Admitting: Physical Therapy

## 2024-04-21 ENCOUNTER — Other Ambulatory Visit: Payer: Self-pay

## 2024-04-21 ENCOUNTER — Ambulatory Visit (INDEPENDENT_AMBULATORY_CARE_PROVIDER_SITE_OTHER): Admitting: Licensed Clinical Social Worker

## 2024-04-21 DIAGNOSIS — F319 Bipolar disorder, unspecified: Secondary | ICD-10-CM

## 2024-04-21 MED ORDER — HIZENTRA 4 GM/20ML ~~LOC~~ SOSY: 8.0000 g | PREFILLED_SYRINGE | SUBCUTANEOUS | 6 refills | Status: AC

## 2024-04-21 NOTE — Progress Notes (Signed)
 Hizentra  20% PFS/vials- Infuse 8gm (40ml) subq every week is what the patient is supposed to be taking.  New order placed so prior auth could be initiated.

## 2024-04-23 ENCOUNTER — Other Ambulatory Visit: Payer: Self-pay | Admitting: *Deleted

## 2024-04-23 DIAGNOSIS — M79641 Pain in right hand: Secondary | ICD-10-CM | POA: Diagnosis not present

## 2024-04-23 MED ORDER — PROPRANOLOL HCL 40 MG PO TABS: 40.0000 mg | ORAL_TABLET | Freq: Two times a day (BID) | ORAL | 3 refills | Status: DC

## 2024-04-25 ENCOUNTER — Encounter (HOSPITAL_COMMUNITY): Payer: Self-pay | Admitting: Licensed Clinical Social Worker

## 2024-04-26 ENCOUNTER — Encounter (HOSPITAL_COMMUNITY): Payer: Self-pay | Admitting: Licensed Clinical Social Worker

## 2024-04-26 NOTE — Progress Notes (Signed)
 Virtual virtual Video Note  I connected with Bryson Gavia Greenfield on 04/21/2024 at  2-3pm EST by video-enabled virtual visit. I verified that I am speaking with the correct person using  two identifiers.I discussed the limitations of evaluation and management by telemedicine and the availability of in person appointments. The patient expressed understanding and agreed to proceed.    LOCATION: Patient: Home  Provider: Home office   History of Present Illness:  Pt was referred by Dr. Arfeen for OP therapy for bipolar disorder and anxiety.  Treatment Goal Addressed:  Pt will meet with clinician weekly for therapy to monitor for progress towards goals and address any barriers to success; Reduce depression from average severity level of 6/10 down to a 4/10 in next 6 months by engaging in 1-2 positive coping skills daily as part of developing self-care routine; Reduce average anxiety level from 7/10 down to 5/10 in next 6 months by utilizing 1-2 relaxation skills/grounding skills per day, such as mindful breathing, progressive muscle relaxation, positive visualizations.  Progress towards Treatment Goal: Progressing   Observations/Objective: Patient presented for today's session on time and was alert, oriented x5, with no evidence or self-report of SI/HI or A/V H.  Patient reported ongoing compliance with medication and denied any use of alcohol  or illicit substances. Clinician inquired about patient's current emotional ratings, as well as any significant changes in thoughts, feelings or behavior since previous session. Patient reported scores of  7/10 for depression, 7/10 for anxiety, 3/10 for anger/irritability. [name] and pt explored his emotional ratings, and discussed coping skills for his stressors. Pt identifies stressors and  allowed pt to explore and express thoughts and feelings associated with recent life situations and external stressors. 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 [name] asked open ended questions. Pt reports on the weekend family gatherings. I saw my daughter who has been difficult towards me recently. [name] asked open ended questions. I felt uncomfortable but I played with the grandchildren a lot.  [name] and pt discussed boundaries and cln provided psychoedcuation on boundaries.  My family is going to the beach next week.        Assessment and plan: Counselor will continue to meet with patient to address treatment plan goals. Patient will continue to follow recommendations of providers and imnd implement skills learned in session, and practice between sessions. Diagnosis: Bipolar 1 disorder.    Collaboration of care: Other:   Continue working with providers    Patient/Guardian was advised Release of Information must be obtained prior to any record release in order to collaborate their care with an outside provider. Patient/Guardian was advised if they have not already done so to contact the registration department to sign all necessary forms in order for us  to release information regarding their care.    Consent: Patient/Guardian gives verbal consent for treatment and assignment of benefits for services provided during this visit. Patient/Guardian expressed understanding and agreed to proceed.     Follow Up Instructions:  I discussed the assessment and treatment plan with the patient. The patient was provided an opportunity to ask questions and all were answered. The patient agreed with the plan and demonstrated an understanding of the instructions.   The patient was advised to call back or seek an in-person evaluation if the symptoms worsen or if the condition fails to improve as anticipated.  I provided 60 minutes of non-face-to-face time during this encounter.   Lelend Heinecke  S, LCAS 04/13/24

## 2024-04-28 ENCOUNTER — Ambulatory Visit (INDEPENDENT_AMBULATORY_CARE_PROVIDER_SITE_OTHER): Admitting: Licensed Clinical Social Worker

## 2024-04-28 DIAGNOSIS — F319 Bipolar disorder, unspecified: Secondary | ICD-10-CM | POA: Diagnosis not present

## 2024-04-28 DIAGNOSIS — M25572 Pain in left ankle and joints of left foot: Secondary | ICD-10-CM | POA: Diagnosis not present

## 2024-04-28 DIAGNOSIS — M170 Bilateral primary osteoarthritis of knee: Secondary | ICD-10-CM | POA: Diagnosis not present

## 2024-04-29 DIAGNOSIS — M48061 Spinal stenosis, lumbar region without neurogenic claudication: Secondary | ICD-10-CM | POA: Diagnosis not present

## 2024-04-29 DIAGNOSIS — H401121 Primary open-angle glaucoma, left eye, mild stage: Secondary | ICD-10-CM | POA: Diagnosis not present

## 2024-04-29 DIAGNOSIS — M461 Sacroiliitis, not elsewhere classified: Secondary | ICD-10-CM | POA: Diagnosis not present

## 2024-04-29 DIAGNOSIS — H3589 Other specified retinal disorders: Secondary | ICD-10-CM | POA: Diagnosis not present

## 2024-04-29 DIAGNOSIS — M47816 Spondylosis without myelopathy or radiculopathy, lumbar region: Secondary | ICD-10-CM | POA: Diagnosis not present

## 2024-04-29 DIAGNOSIS — Z981 Arthrodesis status: Secondary | ICD-10-CM | POA: Diagnosis not present

## 2024-04-29 DIAGNOSIS — H25812 Combined forms of age-related cataract, left eye: Secondary | ICD-10-CM | POA: Diagnosis not present

## 2024-04-29 DIAGNOSIS — H4051X3 Glaucoma secondary to other eye disorders, right eye, severe stage: Secondary | ICD-10-CM | POA: Diagnosis not present

## 2024-05-05 ENCOUNTER — Encounter (HOSPITAL_COMMUNITY): Payer: Self-pay | Admitting: Licensed Clinical Social Worker

## 2024-05-05 ENCOUNTER — Ambulatory Visit (HOSPITAL_COMMUNITY): Admitting: Licensed Clinical Social Worker

## 2024-05-05 NOTE — Progress Notes (Signed)
 Virtual virtual Video Note  I connected with Alex [name] on 04/28/2024 at  2-3pm EST by video-enabled virtual visit. I verified that I am speaking with the correct person using  two identifiers.I discussed the limitations of evaluation and management by telemedicine and the availability of in person appointments. The patient expressed understanding and agreed to proceed.    LOCATION: Patient: Home  Provider: Home office   History of Present Illness:  Pt was referred by Dr. Arfeen for OP therapy for bipolar disorder and anxiety.  Treatment Goal Addressed:  Pt will meet with clinician weekly for therapy to monitor for progress towards goals and address any barriers to success; Reduce depression from average severity level of 6/10 down to a 4/10 in next 6 months by engaging in 1-2 positive coping skills daily as part of developing self-care routine; Reduce average anxiety level from 7/10 down to 5/10 in next 6 months by utilizing 1-2 relaxation skills/grounding skills per day, such as mindful breathing, progressive muscle relaxation, positive visualizations.  Progress towards Treatment Goal: Progressing   Observations/Objective: Patient presented for today's session on time and was alert, oriented x5, with no evidence or self-report of SI/HI or A/V H.  Patient reported ongoing compliance with medication and denied any use of alcohol  or illicit substances. Clinician inquired about patient's current emotional ratings, as well as any significant changes in thoughts, feelings or behavior since previous session. Patient reported scores of  7/10 for depression, 7/10 for anxiety, 3/10 for anger/irritability. [name] and pt explored his emotional ratings, and discussed coping skills for his stressors. Pt identifies stressors and  allowed pt to explore and express thoughts and feelings associated with recent life situations and external stressors. 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 [name] asked open ended questions. Pt reports on continued stressors and cln provided appropriate coping skills for pt to use. Next week we are taking a family trip to the beach. [name] and pt reviewed possible scenarios that could happen during the family beach trip, role played family scenarios.      Assessment and plan: Counselor will continue to meet with patient to address treatment plan goals. Patient will continue to follow recommendations of providers and imnd implement skills learned in session, and practice between sessions. Diagnosis: Bipolar 1 disorder.    Collaboration of care: Other:   Continue working with providers    Patient/Guardian was advised Release of Information must be obtained prior to any record release in order to collaborate their care with an outside provider. Patient/Guardian was advised if they have not already done so to contact the registration department to sign all necessary forms in order for us  to release information regarding their care.    Consent: Patient/Guardian gives verbal consent for treatment and assignment of benefits for services provided during this visit. Patient/Guardian expressed understanding and agreed to proceed.     Follow Up Instructions:  I discussed the assessment and treatment plan with the patient. The patient was provided an opportunity to ask questions and all were answered. The patient agreed with the plan and demonstrated an understanding of the instructions.   The patient was advised to call back or seek an in-person evaluation if the symptoms worsen or if the condition fails to improve as anticipated.  I provided 60 minutes of non-face-to-face time during this encounter.   Alex [name] S,  LCAS 04/28/24

## 2024-05-06 ENCOUNTER — Ambulatory Visit: Admitting: Nurse Practitioner

## 2024-05-11 ENCOUNTER — Telehealth: Payer: Self-pay | Admitting: *Deleted

## 2024-05-12 ENCOUNTER — Ambulatory Visit (INDEPENDENT_AMBULATORY_CARE_PROVIDER_SITE_OTHER): Admitting: Licensed Clinical Social Worker

## 2024-05-12 DIAGNOSIS — F319 Bipolar disorder, unspecified: Secondary | ICD-10-CM | POA: Diagnosis not present

## 2024-05-12 DIAGNOSIS — M7672 Peroneal tendinitis, left leg: Secondary | ICD-10-CM | POA: Diagnosis not present

## 2024-05-12 DIAGNOSIS — M461 Sacroiliitis, not elsewhere classified: Secondary | ICD-10-CM | POA: Diagnosis not present

## 2024-05-12 DIAGNOSIS — M958 Other specified acquired deformities of musculoskeletal system: Secondary | ICD-10-CM | POA: Diagnosis not present

## 2024-05-14 DIAGNOSIS — M19011 Primary osteoarthritis, right shoulder: Secondary | ICD-10-CM | POA: Diagnosis not present

## 2024-05-14 NOTE — Progress Notes (Addendum)
 Chief Complaint  Patient presents with  . Right Shoulder - Evaluation    Subjective  Alex [name] is a 68 y.o. male who presents for Evaluation of the Right Shoulder  History of Present Illness Alex [name] is a 68 year old male with right shoulder glenohumeral arthritis who presents for a second opinion regarding shoulder pain management. He was referred by [facility] for a second opinion on shoulder pain management.  He experiences persistent right shoulder pain rated at 6 out of 10, which has not improved with previous treatments. An injection on March 18, 2024, provided limited relief. The pain interferes with daily activities such as lifting a gallon of milk and tucking in his shirt. Movements, especially lifting his arm or reaching behind his back, exacerbate the pain. It sometimes disrupts his sleep, particularly when lying on the affected side.  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 He also has a history of thyroid  cancer and an ascending aortic aneurysm, which has been stable at 3.8 cm for the past five years.  He has common variable immune deficiency (CVID) and receives Hizentra  infusions at home to maintain his immunoglobulin levels. He had frequent infections prior to starting treatment, but his condition is now stable.  He lives with his wife, who works full-time, and he has seven children. He is retired and has been on disability since 2006 due to his medical conditions.  No chest pain or shortness of breath. Reports right shoulder pain, limited range of motion, and sleep disturbances due to shoulder pain.  The patient reports that the problem began gradually without injury 8 months ago.  When the joint is painful, he describes the pain as moderate (active but had to make modifications or give up activities). On a scale  of 0 (no pain) to 10 (worst pain ever), his pain is at a 5/10 and the pain is and does prevent him from performing any of his normal hobbies. He cannot perform sports at the same level as before the injury. The patient does not lift a lot of weight with his shoulders; he feels like his shoulder is not  stable (like it may dislocate) and does feel weak. His symptoms do wake him from sleep. Symptoms are improved by acetaminophen , injections, muscle relaxants and non-steroidal anti-inflammatories and are   Review of Systems  Patient Active Problem List  Diagnosis  . Secondary glaucoma due to combination mechanisms, right, severe stage  . Primary open angle glaucoma (POAG) of left eye, mild stage  . Combined form of age-related cataract, left eye  . Dry eye syndrome of both eyes  . Radiation retinopathy, sequela  . Cicatricial lagophthalmos of left lower eyelid  . Cicatricial lagophthalmos of right lower eyelid  . Abnormal finding on imaging of liver  . IPMN (intraductal papillary mucinous neoplasm)  . Prostate cancer (CMS/HHS-HCC)  . Hypogonadism male  . Prostatitis, chronic  . Benign prostatic hyperplasia with urinary obstruction  . Retinal edema  . Nonexudative age-related macular degeneration, left eye, early dry stage  . Cystoid macular edema of right eye  . Diabetes mellitus type 2 without retinopathy (CMS/HHS-HCC)  . Superficial punctate keratitis of right eye  . Cutaneous abscess of right shoulder  . Primary osteoarthritis of both knees    Outpatient Medications Prior to Visit  Medication Sig Dispense Refill  . amLODIPine  (NORVASC ) 10 MG  tablet Take 10 mg by mouth at bedtime    . aspirin  81 MG EC tablet Take 81 mg by mouth every morning    . atorvastatin  (LIPITOR) 40 MG tablet Take 40 mg by mouth at bedtime    . brimonidine (ALPHAGAN) 0.2 % ophthalmic solution Place 1 drop into both eyes 2 (two) times daily    . co-enzyme Q-10, ubiquinone, 30 mg capsule Take 1 capsule (30 mg  total) by mouth once daily    . DOCOSAHEXAENOIC ACID ORAL Take 1 tablet by mouth every morning    . doxycycline  (VIBRA -TABS) 100 MG tablet Take 100 mg by mouth 2 (two) times daily    . EPINEPHrine  (EPIPEN ) 0.3 mg/0.3 mL auto-injector Inject 0.3 mg into the muscle once    . immun glob G,IgG,/pro/IgA 0-50 (HIZENTRA  SUBQ) Inject subcutaneously once a week Sundays - last dose 06/02/2022    . lamoTRIgine  300 mg TR24 Take 300 mg by mouth every morning    . latanoprost  (XALATAN ) 0.005 % ophthalmic solution INSTILL 1 DROP INTO BOTH EYES EVERY DAY AT NIGHT (Patient taking differently: Place 1 drop into both eyes at bedtime Every AM 1 drop right eye) 7.5 mL 4  . levothyroxine  (SYNTHROID , LEVOTHROID) 150 MCG tablet Take 150 mcg by mouth every morning    . LORazepam  (ATIVAN ) 0.5 MG tablet Take 0.5 mg by mouth at bedtime    . meloxicam  (MOBIC ) 15 MG tablet Take 1 tablet by mouth once daily as needed for Pain    . methocarbamoL  (ROBAXIN ) 500 MG tablet Take 500 mg by mouth 2 (two) times daily as needed (Patient not taking: Reported on 04/18/2023)    . multivitamin tablet Take 1 tablet by mouth once daily (Patient not taking: Reported on 02/16/2024)    . omeprazole (PRILOSEC) 20 MG DR capsule Take 20 mg by mouth every morning    . oxyBUTYnin (DITROPAN) 5 mg tablet Take 1 tablet (5 mg total) by mouth 3 (three) times daily as needed (bladder spasms) for up to 10 days 30 tablet 0  . polyethylene glycol (MIRALAX) packet Take 1 packet (17 g total) by mouth once daily Mix in 4-8ounces of fluid prior to taking. 30 packet 0  . sennosides-docusate (SENOKOT-S) 8.6-50 mg tablet Take 1 tablet by mouth 2 (two) times daily 30 tablet 0  . tadalafiL  (CIALIS ) 20 MG tablet Take 1 tablet (20 mg total) by mouth once daily as needed for Erectile Dysfunction for up to 360 days 30 tablet 11  . tamsulosin  (FLOMAX ) 0.4 mg capsule TAKE 1 CAPSULE BY MOUTH EVERY DAY (Patient not taking: Reported on 02/21/2023) 90 capsule 3  .  telmisartan -hydrochlorothiazide  (MICARDIS  HCT) 80-25 mg tablet Take 1 tablet by mouth every morning    . testosterone  30 mg/actuation (1.5 mL) SlPm Place onto the skin once daily    . timoloL  maleate (TIMOPTIC ) 0.5 % ophthalmic solution INSTILL 1 DROP INTO RIGHT EYE TWICE A DAY 10 mL 11  . traMADoL  (ULTRAM ) 50 mg tablet Take 50 mg by mouth every 6 (six) hours as needed    . vit C/E/Zn/coppr/lutein/zeaxan (PRESERVISION AREDS-2 ORAL) Take 1 tablet by mouth 2 (two) times daily    . zolpidem  (AMBIEN ) 10 mg tablet Take 5 mg by mouth at bedtime     Facility-Administered Medications Prior to Visit  Medication Dose Route Frequency Provider Last Rate Last Admin  . aflibercept  (EYLEA  HD) intravitreal injection 8 mg  8 mg RIGHT Eye  Lad, Eleonora Georgeta, MD   8 mg at  10/31/22 1330  . aflibercept  (EYLEA  HD) intravitreal injection 8 mg  8 mg RIGHT Eye  Lad, Eleonora Georgeta, MD   8 mg at 12/12/22 1721  . aflibercept  (EYLEA  HD) intravitreal injection 8 mg  8 mg RIGHT Eye  Lad, Eleonora Georgeta, MD   8 mg at 03/13/23 1441  . aflibercept  (EYLEA  HD) intravitreal injection 8 mg  8 mg RIGHT Eye  Lad, Eleonora Georgeta, MD   8 mg at 04/28/23 1536  . aflibercept  (EYLEA  HD) intravitreal injection 8 mg  8 mg RIGHT Eye  Lad, Eleonora Georgeta, MD   8 mg at 07/07/23 1346      Objective  There were no vitals filed for this visit. There is no height or weight on file to calculate BMI.  Home Vitals:     Physical Exam Physical Exam MUSCULOSKELETAL: Right shoulder range of motion limited: elevation 90 degrees, abduction 60 degrees, internal rotation to iliac crest. Strength 4/5 for abduction and external rotation. Passive range of motion near full.  Constitutional: alert, in NAD, and communicates well Eye exam: pupils equal and reactive, extraocular eye movements intact. Neck: supple, no thyroid  enlargement or cervical adenopathy, and no bruits heard Respiratory: clear to auscultation, without rales or wheezes   Cardiovascular: regular rate and rhythm and without murmurs, rubs or gallops Lower extremities: no lower extremity edema Skin ankles/feet: warm, good capillary refill and no ulcerations or lesions noted Neurological: sensorimotor grossly intact and normal muscle tone  Results RADIOLOGY Right shoulder X-ray: Glenohumeral arthritis, well-centered on axillary view, difficult to assess head elevation on AP view (11/27/2023)  DIAGNOSTIC Ascending aortic aneurysm measurement: 3.8 cm, stable over the last five years     Assessment/Plan:   Assessment & Plan Right shoulder glenohumeral osteoarthritis with suspected rotator cuff tear Chronic pain and limited range of motion due to glenohumeral arthritis. Suspected rotator cuff tear. - Order MRI to assess rotator cuff function. - Schedule reverse shoulder arthroplasty. - Utilize 3D modeling for implant fitting. - Arrange postoperative physical therapy in [place]. - Conduct postoperative visits via telehealth if preferred. Diagnoses and all orders for this visit:  Arthritis of right shoulder -     Request for [employer] services; Future -     25 OH Vitamin D ; Future -     ECG 12-lead; Future -     Comprehensive Metabolic Panel (CMP); Future -     CT shoulder right without contrast; Future -     MRI shoulder right without contrast; Future  Other orders -     benzoyl peroxide 5 % topical gel; Apply topically once daily for 3 days Apply to shoulder and axillary area for 3 days prior to surgery. Leave gel on for 5 minutes, then wash it off.   RBA Discussion  Reverse shoulder arthroplasty generally gives good results for most patients. It is not a normal shoulder.  Reverse shoulder arthroplasty is used when other treatments are not expected to be successful.    Pain relief is generally good.  Strength is generally improved but not always.  Range of motion is equally as good as and anatomic shoulder replacement except for the ability  to reach behind one's back.  15-year results show 85% survival of the prostheses. There are permanent weight lifting restrictions.  Lifting heavy weights can lead to early failure of the reverse shoulder.  Lifting greater than 25 pounds with the affected arm as not recommended. Risk of surgery include but not limited to the following: Infection Permanent or  temporary nerve injury Dislocation and instability Loss of internal rotation and reaching behind back Arm lengthening Persistent pain Cosmesis issues Fracture Lack of functional improvement Change in the shoulders appearance Blood clots Anesthetic risks and other general risk of surgery others      Future Appointments     Date/Time Provider Department Center Visit Type   05/17/2024 10:45 AM Lad, Donivan Barry, MD [facility] [place] [facility] INJECTION   06/10/2024 1:00 PM Leonor Asberry Caldron, PhD [facility] Support Services Level 2 Cancer Ctr ONC COUNSELING VIDEO VISIT RETURN (NO CHARGE)   06/14/2024 2:30 PM Lad, Eleonora Georgeta, MD [facility] [place] OPH INJECTION/RETURN   07/26/2024 11:30 AM (Arrive by 11:00 AM) CC MR 2 [facility] Radiology MRI Cancer Ctr MRI PROSTATE [facility]   07/28/2024 2:30 PM (Arrive by 2:15 PM) Gretta Harlene Kennedy, PA [facility] [place] VIDEO VISIT RETURN       Patient Instructions  VISIT SUMMARY: You came in today for a second opinion on managing your right shoulder pain. You have been experiencing persistent pain that affects your daily activities and sleep. We discussed your history of multiple cancers, common variable immune deficiency, and other health conditions.  YOUR PLAN: -RIGHT SHOULDER GLENOHUMERAL OSTEOARTHRITIS WITH SUSPECTED ROTATOR CUFF TEAR: This condition involves the degeneration of the shoulder joint and possibly a tear in the rotator cuff, which is a group of muscles and tendons that stabilize the shoulder. We will  order an MRI to assess the rotator cuff and schedule a reverse shoulder arthroplasty, which is a type of shoulder replacement surgery. We will use 3D modeling to ensure the implant fits well. After surgery, you will have physical therapy in [place], and we can conduct follow-up visits via telehealth if you prefer.  INSTRUCTIONS: Please schedule an MRI to assess your rotator cuff function. We will also schedule your reverse shoulder arthroplasty and arrange for postoperative physical therapy in [place]. Follow-up visits can be done via telehealth if that is more convenient for you.  AI was used to generate this content from the visit notes. Reverse Total Shoulder Replacement    A SHOULDER REPLACEMENT IS NOT A NORMAL SHOULDER. IT USUALLY DECREASES ONE'S PAIN AND MAY OR MAY NOT IMPROVE MOTION.  Every year, thousands of conventional total shoulder replacements are successfully done in the [place] for patients with shoulder arthritis.  However, this type of procedure is not as beneficial for patients with large rotator cuff tears who have developed a complex type of shoulder arthritis called cuff tear arthropathy. For these patients, conventional total shoulder replacement may result in pain and limited motion, and reverse total shoulder replacement is a better option. Description A conventional shoulder replacement device mimics the normal anatomy of the shoulder: A plastic cup is fitted into the shoulder socket (glenoid), and a metal ball is attached to the top of the upper arm bone (humerus). In a reverse total shoulder replacement, the socket and metal ball are switched. The metal ball is fixed to the socket, and the plastic cup is fixed to the upper end of the humerus.  In a healthy shoulder, the ball of the humerus is held in the shoulder socket by several muscles and tendons, including the rotator cuff tendons. A reverse total shoulder replacement works better for people with cuff tear  arthropathy because it relies on different muscles to move the arm. In a healthy shoulder, the rotator cuff muscles help position and power the arm during range  of motion. A conventional replacement device also uses the rotator cuff muscles to function properly. In a patient with a large rotator cuff tear and cuff tear arthropathy, these muscles no longer function. The reverse total shoulder replacement relies on the deltoid muscle, instead of the rotator cuff, to power and position the arm. It essentially re-creates the function of the torn rotator cuff. This surgery was originally designed in the 41s in [place]. The Food and Drug Administration (FDA) approved its use in the [place] in 2003.  (Left) Rotator cuff arthropathy. (Right) The reverse total shoulder replacement allows other muscles -- such as the deltoid -- to do the work of the damaged rotator cuff tendons. Candidates for Surgery Reverse total shoulder replacement may be recommended if you have: A completely torn rotator cuff that cannot be repaired Cuff tear arthropathy A previous shoulder replacement that was unsuccessful Severe shoulder pain and difficulty lifting your arm away from your side or over your head A complex fracture of the shoulder joint A chronic shoulder dislocation A tumor of the shoulder joint Severe bone loss or deformity Advanced age Poor function of rotator cuff  Your doctor may also recommend surgery if nonsurgical treatments, such as rest, medications, cortisone injections, and/or physical therapy, have not relieved your shoulder pain  Preparing for Surgery Your orthopaedic surgeon will help you plan and prepare for your shoulder surgery. Medical Evaluation Most patients must have a complete physical by their primary care doctor or internist before surgery. This is needed to make sure you are healthy enough to have the surgery and complete the recovery. Many patients with chronic medical conditions, like  heart disease, must also be evaluated by a specialist, such a cardiologist, before the surgery.  Medications Be sure to talk to your orthopaedic surgeon about the medications you take. Some medications may need to be stopped before surgery. For example, the following over-the-counter medicines may cause excessive bleeding and should be stopped 2 weeks before surgery: Non-steroidal anti-inflammatory medications, such as aspirin , ibuprofen, and naproxen  Some arthritis medications If you take blood thinners, either your primary care doctor or cardiologist will advise you about stopping these medications before surgery. Additionally, certain types of rheumatoid arthritis medication may need to be stopped for a period of time.   Home Planning Making simple changes in your home before surgery can make your recovery period easier. For the first several weeks after your surgery, it will be hard to reach high shelves and cupboards. Before your surgery, go through your home and place any items you may need afterward on low shelves. When you come home from the hospital, you will need help for a few weeks with some daily tasks like dressing, bathing, cooking, and laundry. If you will not have any support at home immediately after surgery, you may need a short stay in a rehabilitation facility until you become more independent. Helpful Things Post op:  Shower Sling   Shoulder surgery Velcro sleeve shirt   Learn more: Preparing for Joint Replacement Surgery  YOU TUBE VIDEO OF PATIENT INFORMATION  [email or web address] Your Surgery Before Your Operation Wear loose-fitting clothes and a button-front shirt when you go to the hospital for your surgery. After surgery, you will be wearing a sling and will have limited use of your arm. You will be able to go home the same day. Once you are taken to the preoperative preparation area, you will meet with a doctor from the anesthesia department. You, your  anesthesiologist, and your surgeon will  discuss the type of anesthesia to be used during your procedure. You may be provided: A general anesthetic (you are asleep for the entire operation), uncommon A regional anesthetic (you may be awake but have no feeling around the surgical area) A combination of both general and regional anesthetics Surgical Procedure This procedure to replace your shoulder joint with an artificial device usually takes about 1.5 hours. Your surgeon will make an incision either on the front or the top of your shoulder. They will remove the damaged bone and then position the new components to restore function to your shoulder.  The components of a reverse total shoulder replacement include the metal ball that is screwed into the shoulder socket, and the plastic cup that is secured into the upper arm bone. To Top Surgical Complications Reverse total shoulder replacement is a highly technical procedure. Your surgeon will evaluate your particular situation carefully and discuss the risks of surgery with you. Risks for any surgery include bleeding, nerve damage, dislocation and infection. Complications specific to a total joint replacement include wear, loosening, fracture or dislocation of the components. If any of these occur, the new shoulder joint may need to be revised, or re-operated on.  A typical follow-up X-ray of a reverse total shoulder replacement. Recovery Before surgery, your medical team will give you antibiotics to reduce your risk for infection, and afterwards pain medication to keep you comfortable. Most patients are able to go home the same day as their surgery, while others need to be admitted to the hospital overnight. Most patients who are admitted go home the next day. Your surgeon will talk to you about whether an overnight stay or same-day discharge is appropriate for you. Pain Management After surgery, you will feel some pain. This is a natural part of the  healing process. Your doctor and nurses will work to reduce your pain, which can help you recover from surgery faster. Medications are often prescribed for short-term pain relief after surgery. Many types of medicines are available to help manage pain, including opioids, nonsteroidal anti-inflammatory drugs (NSAIDs), and local anesthetics. Most patients do not require any opioids. Your doctor may use a combination of these medications to improve pain relief, as well as minimize the need for opioids. Be aware that although opioids help relieve pain after surgery, they are a narcotic and can be addictive. Opioid dependency and overdose have become critical public health issues. It is important to use opioids only as directed by your doctor and to stop taking them as soon as your pain begins to improve. Talk to your doctor if your pain has not begun to improve within a few weeks of your surgery. POST-OPERATIVE PAIN: YOU WILL HAVE A PAIN BLOCK PRIOR TO SURGERY THAT TYPICALLY LASTS 2 AND A HALF DAYS. THIS LIMITS YOUR NEED FOR STRONG PAIN MEDICATIONS SUCH AS OPIOIDS. MANY PATIENTS NEVER TAKE THESE STRONGER MEDICATIONS AT ALL  AFTER SURGERY. Rehabilitation When you leave the hospital, your arm will be in a sling. Your surgeon may instruct you to do gentle range of motion exercises to increase your mobility and endurance. They may also recommend a formal physical therapy program to strengthen your shoulder and improve flexibility. You should be able to eat, dress, and groom yourself within a few days after surgery. Your surgeon may ask you to return for office visits and X-rays to monitor your shoulder. Do's and Don'ts After Surgery Do follow the home exercise program prescribed by your doctor. Do avoid extreme arm positions, such  as behind your body or your arm straight out to the side, for the first 6 weeks. Don't overdo it. Don't lift anything heavier than 5 lbs. for the first 6 weeks after surgery. Don't  push yourself up out of a chair or bed, as this requires forceful muscle contractions. Don't participate in repetitive heavy lifting after shoulder replacement. Long-Term Outcomes After rehabilitation, you will most likely be able to lift your arm to just above shoulder height and bend your elbow to reach the top of your head or into a cupboard. Reverse total shoulder replacement provides outstanding pain relief and patient satisfaction is typically very high. Early and mid-term studies of the results of this surgery have been very promising. Currently, very few long-term studies exist. This is an area for future research. To help doctors in the management of glenohumeral joint arthritis -- the American Academy of Orthopaedic Surgeons has conducted research to provide some useful guidelines. These are recommendations only and may not apply to every case. For more information: Glenohumeral Joint Osteoarthritis - Clinical Practice Guideline (CPG)  American Academy of Orthopaedic Surgeons (aaos.org)  Last Reviewed August 2022  Contributed and/or Updated by Zachary RAMAN. [place], MDMeredith Fabing, DO   Peer-Reviewed by Debby Neomi Police, MD, BURLEY Glean ALF Fischer, MDJ. Ozell Slimmer, MD AAOS does not endorse any treatments, procedures, products, or physicians referenced herein. This information is provided as an Statistician and is not intended to serve as medical advice. Anyone seeking specific orthopaedic advice or assistance should consult his or her orthopaedic surgeon, or locate one in your area through the AAOS Find an Orthopaedist program on this website.  Do I need antibiotics if I have dental work?  The American Academy of Orthopedic Surgeons and the American Dental Association have made a joint clinical practice guideline recommendation in 2012 that antibiotics are NOT needed prior to dental work. The reason for this is that there is a lack of evidence to show that it makes  any difference. In general, I recommend waiting at least 3 months after shoulder replacement to have dental work.  Dental Prophylaxis from American Dental Association  VegetarianPizzas.si  Antibiotic Prophylaxis Prior to Dental Procedures  Key Points Compared with previous recommendations, there are currently relatively few patient subpopulations for whom antibiotic prophylaxis may be indicated prior to certain dental procedures. In patients with prosthetic joint implants, a January 2015 ADA clinical practice guideline, based on a 2014 systematic review states, "In general, for patients with prosthetic joint implants, prophylactic antibiotics are not recommended prior to dental procedures to prevent prosthetic joint infection."  A REVERSE SHOULDER REPLACEMENT IS NOT A NORMAL SHOULDER. IT USUALLY DECREASES ONES PAIN AND MAY OR MAY NOT IMPROVE MOTION. Home Planning Making simple changes in your home before surgery can make your recovery period easier. For the first several weeks after your surgery, it will be hard to reach high shelves and cupboards. Before your surgery, be sure to go through your home and place any items you may need afterwards on low shelves.  Most patients go home the same day as surgery unless they need admission to a rehab facility where they are less exposed to sick patients and sleep better. You will sleep in the sling for 6 weeks but may remove it during the day at home.  Many people prefer to sleep in a recliner  initially, however you may sleep in a bed on your back or non-operative side.  When you come home from the hospital, you will  need help for a few weeks with some daily tasks like dressing, bathing, cooking, and laundry. If you will not have any support at home  Here is a good video of the  procedure: [email or web address]  RBA Discussion  Reverse shoulder arthroplasty generally gives good results for most patients. It is not a normal shoulder.  Reverse shoulder arthroplasty is used when other treatments are not expected to be successful.    Pain relief is generally good.  Strength is generally improved but not always.  Range of motion is equally as good as and anatomic shoulder replacement except for the ability to reach behind one's back.  15-year results show 85% survival of the prostheses. There are permanent weight lifting restrictions.  Lifting heavy weights can lead to early failure of the reverse shoulder.  Lifting greater than 25 pounds with the affected arm as not recommended. Risk of surgery include but not limited to the following: Infection Permanent or temporary nerve injury Dislocation and instability Loss of internal rotation and reaching behind back Arm lengthening Cosmesis Fracture Lack of functional improvement Change in the shoulders appearance Blood clots Anesthetic risks and other general risk of surgery       AI was used to generate this content from the visit notes.  An after visit summary was provided for the patient either in written format (printed) or through [facility].  This note has been created using automated tools and reviewed for accuracy by TALLY EDWARD LASSITER JR.

## 2024-05-17 DIAGNOSIS — T66XXXS Radiation sickness, unspecified, sequela: Secondary | ICD-10-CM | POA: Diagnosis not present

## 2024-05-17 DIAGNOSIS — H3589 Other specified retinal disorders: Secondary | ICD-10-CM | POA: Diagnosis not present

## 2024-05-18 ENCOUNTER — Ambulatory Visit: Payer: Self-pay

## 2024-05-18 ENCOUNTER — Ambulatory Visit: Admitting: Psychology

## 2024-05-18 DIAGNOSIS — R419 Unspecified symptoms and signs involving cognitive functions and awareness: Secondary | ICD-10-CM | POA: Diagnosis not present

## 2024-05-18 DIAGNOSIS — R4189 Other symptoms and signs involving cognitive functions and awareness: Secondary | ICD-10-CM

## 2024-05-18 NOTE — Progress Notes (Cosign Needed)
   Psychometrician Note   Cognitive testing was administered to Alex [name] by Evalene Pizza, B.S. (psychometrist) under the supervision of Dr. Renda Beckwith, Psy.D., licensed psychologist on 05/18/2024. Mr. Borquez did not appear overtly distressed by the testing session per behavioral observation or responses across self-report questionnaires. Rest breaks were offered.   The battery of tests administered was selected by Dr. Renda Beckwith, Psy.D. with consideration to Mr. Fuhs current level of functioning, the nature of his symptoms, emotional and behavioral responses during interview, level of literacy, observed level of motivation/effort, and the nature of the referral question. This battery was communicated to the psychometrist. Communication between Dr. Renda Beckwith, Psy.D. and the psychometrist was ongoing throughout the evaluation and Dr. Renda Beckwith, Psy.D. was immediately accessible at all times. Dr. Renda Beckwith, Psy.D. provided supervision to the psychometrist on the date of this service to the extent necessary to assure the quality of all services provided.    Jameal Razzano Adamik will return within approximately 1-2 weeks for an interactive feedback session with Dr. Beckwith at which time his test performances, clinical impressions, and treatment recommendations will be reviewed in detail. Mr. Riedl understands he can contact our office should he require our assistance before this time.  A total of 217 minutes of billable time were spent face-to-face with Mr. Arthurs by the psychometrist. This includes both test administration and scoring time. Billing for these services is reflected in the clinical report generated by Dr. Renda Beckwith, Psy.D.  This note reflects time spent with the psychometrician and does not include test scores or any clinical interpretations made by Dr. Beckwith. The full report will follow in a separate note.

## 2024-05-19 ENCOUNTER — Ambulatory Visit (HOSPITAL_COMMUNITY): Admitting: Licensed Clinical Social Worker

## 2024-05-19 NOTE — Progress Notes (Signed)
 NEUROPSYCHOLOGICAL EVALUATION Alex [name]. [facility]  [name] Department of Neurology  Date of Evaluation: 05/18/2024  REASON FOR REFERRAL   Alex [name] is a 68 year old, right-handed, White male with 14 years of formal education. He was referred for neuropsychological evaluation by Camie Sevin, PA-C, to assess current neurocognitive functioning, document potential cognitive deficits, and assist with treatment planning. This is his first neuropsychological evaluation.  SUMMARY OF RESULTS   Premorbid cognitive abilities are estimated to be in the average range based on word reading and sociodemographic factors. Consistent with this baseline estimate, current performance was intact across all domains assessed, including attention/working memory, processing speed, executive functioning, language, visuospatial abilities, and learning/memory. The only exception to this was a low score on a task of semantic fluency, but considering that the remainder of testing was normal, this result is not overly concerning.  On self-report questionnaires, he endorsed severe symptoms of anxiety and mild symptoms of depression.  DIAGNOSTIC IMPRESSION   Results of the current evaluation indicated normal cognitive functioning. There is no evidence of a neurocognitive disorder at this time. His overall profile reflects both well-preserved cognitive and functional abilities. 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 To the degree that modifiable factors can be ameliorated, he may find that his subjective cognitive concerns improve.   Results serve as a baseline for future comparison, should it ever become necessary to re-evaluate the patient.    ICD-10 Codes: R41.9 Cognitive concerns with normal neuropsychological exam   RECOMMENDATIONS   In consultation with your doctor, schedule cognitive reevaluation on an as-needed basis to assess for cognitive decline and update treatment recommendations. Reevaluation should occur during a period of medical and affective stability.  Continue mental health treatment, especially given that emotional distress can exacerbate cognitive difficulties. Discuss current medication regimen with your prescribing provider to ensure you are receiving maximum benefit, and continue regular appointments with your counselor.  Given ongoing concerns about sleep quality and the possibility of sleep apnea, a sleep study is recommended. While you have expressed reluctance about using a CPAP machine, it is important to first determine whether sleep apnea is present. If diagnosed, there are several treatment options available beyond CPAP that may improve your sleep, cognitive functioning, and overall health. Addressing potential sleep issues is an important step toward supporting your brain health.  You may also benefit from the implementation of sleep hygiene techniques, including:  Go to bed and get up at the same time each day to help your body establish a regular rhythm. Establish and maintain a bedtime routine. Certain activities such as stretching, meditating, listening to soft music, or reading ~15 minutes before bedtime can be a great way to regularly get your brain and body ready for sleep. Avoid taking naps during the day. Avoid alcohol  and caffeine for 5 or 6 hours before going to bed. Get regular exercise, but not in the hours before bedtime. Use comfortable bedding and maintain a cool temperature in your bedroom. Block out light and distracting noise. Avoid watching television or using your phone/computer in bed. Avoid staying in bed if you have difficulty falling asleep. If you have not been able to get  to sleep after about 20 minutes or more, get up and do something calming or boring until you feel sleepy, then return to bed and try  again.  Prioritize physical health through diet, exercise, and sleep. Regular physical activity supports cardiovascular health, improves mood, and helps preserve mobility and independence. Aim for at least 150 minutes of moderate aerobic exercise per week (e.g., brisk walking, swimming, gardening). A brain-healthy diet such as the Mediterranean or MIND diet is rich in fruits, vegetables, whole grains, healthy fats, and lean proteins, and has been associated with reduced risk of cognitive decline. Additionally, getting adequate, quality sleep and managing chronic conditions with the help of healthcare providers are essential components of healthy aging.  Continue to stay socially and mentally engaged. Maintaining strong social connections and regularly stimulating your brain can help protect against cognitive decline. This includes staying connected with friends and family, volunteering, or participating in community groups. Mentally engaging activities--such as reading, doing puzzles, playing strategy games, or learning a new language or musical instrument--promote brain plasticity. If you are interested in activities to support cognitive engagement, this site offers a variety of apps and games organized by difficulty level:  https://www.barrowneuro.org/get-to-know-barrow/centers-programs/neurorehabilitation-center/neuro-rehab-apps-and-games/  Consider implementing compensatory strategies to maximize independence and maintain daily functioning. Examples include:  Adhere to routine. Compensatory strategies work best when they are used consistently. Use a planner, calendar, or white board that has the schedule and important events for the day clearly listed to reference and cross off when tasks are complete.  Ask for written information, especially if it is new or unfamiliar  (e.g., information provided at a doctor's appointment).  Create an organized environment. Keep items that can be easily misplaced in a sensible location and get into the habit of always returning the items to those places. Pay attention and reduce distractions. Make a point of focusing attention on information you want to remember. One-on-one interaction is more likely to facilitate attention and minimize distraction. Make eye contact and repeat the information out loud after you hear it. Reduce interruptions or distractions especially when attempting to learn new information.  Create associations. When learning something new, think about and understand the information. Explain it in your own words or try to associate it with something you already know. Take notes to help remember important details. Evaluate goals and plan accordingly. When confronted by many different tasks, begin by making a list that prioritizes each task and estimates the time it will take to complete. Break down complicated tasks into smaller, more manageable steps. Focus on one task at a time and complete each task before starting another. Avoid multitasking.  DISPOSITION   Patient will follow up with the referring provider, Ms. Wertman. No follow-up neuropsychological testing was scheduled at this time. Please feel free to refer the patient for repeated evaluation if he shows a significant change in neurocognitive status. He will be provided verbal feedback in approximately one week regarding the findings and impression during this visit.  The remainder of the report includes the details of the patient's background and a table of results from the current evaluation, which support the summary and recommendations described above.  BACKGROUND   History of Presenting Illness: The following information was obtained from a review of medical records and an interview with the patient. Briefly, the patient was evaluated by Camie Sevin,  PA-C, at [facility] on 03/11/2024 due to cognitive concerns over the past several months. MoCA = 25/30. He was referred for neuropsychological evaluation to aid in clarifying the underlying etiology of his cognitive concerns.  Cognitive Functioning: During today's appointment, the patient reported experiencing cognitive changes over the past year. Initially, he perceived  a noticeable decline, but he feels that his symptoms have since stabilized. One of his primary concerns is difficulty with attention. He describes becoming easily distracted--often starting a task, abandoning it to begin another, and then forgetting to return to the original task. While he ultimately comprehends material, he finds that he requires more time to fully digest it. Additionally, he noted some word-finding difficulties. In relation to vision issues, he acknowledged problems with depth perception. However, he denied any difficulties with navigation. He also denied significant concerns with short-term memory and executive functioning (e.g., planning, organizing). He manages his schedule using his phone and reported being consistent with following it. When traveling, he is able to create spreadsheets and track necessary details effectively.  Physical Functioning: Patient reported ongoing difficulties with sleep maintenance. He was previously taking Ambien  for many years but recently discontinued it and has since noticed increased difficulty returning to sleep during the night. Over-the-counter sleep aids have not been effective. He also reported experiencing vivid nightmares and a decrease in energy levels. A sleep study was recommended, but he has not followed through, as he feels he would probably decline treatment options such as a CPAP machine. Appetite is stable. He noted a slightly diminished sense of smell, while his sense of taste remains unchanged. Hearing is reduced at certain frequencies; although he owns hearing aids,  he does not wear them. He has very poor vision in his right eye due to macular edema following prior radiation treatments, while vision in his left eye is stable. He experiences balance difficulties and has a history of falls, which he attributes to issues with both balance and depth perception. He denied tremors. He endorsed experiencing chronic pain in his back and shoulder.  Psychological Functioning: Patient described his recent mood as stable and denied any recent manic episodes or suicidal ideation. He reported situational depression and anxiety, describing several ongoing stressors including his overall health, marital problems, and concerns about his son's self-injurious behavior, which included a life-threatening incident. He also referenced past trauma related to finding his daughter overdosed. 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 He is currently engaged in weekly counseling.  Neuroimaging: MRI of the brain (04/02/2024) documented mild volume loss and chronic small vessel disease.  Other Relevant Medical History: Remarkable for coronary atherosclerosis, hypertension, ascending aortic aneurysm, chronic bronchitis, asthma, hyperlipidemia, prediabetes, hypothyroidism, benign prostatic hyperplasia, osteoarthritis, scoliosis, gastroesophageal reflux disease, atrophic gastritis, irritable bowel syndrome, and history of cancer (i.e., prostate, thyroid , lacrimal sac). Please refer to the medical record for a more comprehensive problem list. No history of stroke, CNS infection, head injury, or seizure was reported.  Current Medications: Per record, cervical disc disorder with radiculopathy, acetaminophen , aspirin , atorvastatin , Axiron , brimonidine, coenzyme Q10, erythromycin ophthalmic ointment, immune globulin, lamotrigine , latanoprost , levothyroxine , meloxicam , multivitamin, nitroglycerin  spray, omega-3 krill oil, pantoprazole , propranolol , sildenafil , tadalafil ,  tamsulosin , telmisartan , timolol , and tramadol .   Functional Status: Patient independently performs all basic and instrumental (e.g., driving, finances, medication) activities of daily living.  Family Neurological History: Remarkable for late-life dementia in his father, suspected to be Alzheimer's disease.  Psychiatric History: Remarkable for bipolar disorder and anxiety, currently managed with medication prescribed by psychiatry and ongoing weekly counseling. He reported one prior psychiatric hospitalization, which occurred after his first round of cancer treatment. He stated that he was not suicidal during that time but believes he made statements that raised concerns, leading to his brief hospitalization. He denied any history of hallucinations.  Substance Use History: Patient  denied current use of alcohol , nicotine, marijuana, and illicit substances.  Social and Developmental History: Patient was born in [place], [place]. History of perinatal complications and developmental delays was not reported. 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 He is married and has seven children, one of whom is deceased. He currently lives with his wife in a private residence.  Educational and Occupational History: No history of childhood learning disability, special education services, or grade retention was reported. 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 He noted that he often got into trouble for having difficulty staying in his seat and frequently fiddling with objects, which he feels was likely related to attentional difficulties. He completed high school on time, followed by one year of college. After an eight-year break, he returned and completed an associate degree in Lobbyist; he was on the President's List for three semesters. 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  BEHAVIORAL OBSERVATIONS   Patient arrived on time and was unaccompanied. He ambulated independently and without gait disturbance. He was alert and fully oriented. He was appropriately groomed and dressed for the setting. No significant motor abnormalities were observed, although it is worth noting that he fidgeted throughout the entire clinical interview. Vision (with glasses) and hearing were adequate for testing purposes. Speech was of normal rate, prosody, and volume. No conversational word-finding difficulties, paraphasic errors, or dysarthria were observed. Comprehension was conversationally intact. Thought processes were linear, logical, and coherent. Thought content was organized and devoid of delusions. Insight appeared appropriate. Affect was even and congruent with mood, which at times appeared anxious. He was cooperative and gave adequate effort during testing, including on standalone and embedded measures of performance validity. Results are thought to accurately reflect his cognitive functioning at this time.  NEUROPSYCHOLOGICAL TESTING RESULTS   Tests Administered: Animal Naming Test; Brief Visuospatial Memory Test-Revised (BVMT-R) - Form 1; Controlled Oral Word Association Test (COWAT): FAS; Geriatric Anxiety Scale-10 Item (GAS-10); Geriatric Depression Scale Short Form (GDS-SF); Hopkins Verbal Learning Test-Revised (HVLT-R) - From 1; Neuropsychological Assessment Battery (NAB) - Subtest(s): Naming Form 1; Standalone performance validity tests (PVTs); Test of Premorbid Functioning (TOPF); Trail Making Test (TMT); Wechsler Adult Intelligence Scale Fifth Edition (WAIS-5) - Subtest(s): Similarities, Clinical cytogeneticist, Matrix Reasoning, Digits Forward, Digit Sequencing, Coding, Symbol Search, Digits Backward; Wechsler Memory Scale Fourth Edition (WMS-IV) - Subtest(s): Logical Memory (LM); and [place]  Card Sorting Test 64 Card Version (WCST-64).  Test results  are provided in the table below. Whenever possible, the patient's scores were compared against age-, sex-, and education-corrected normative samples. Interpretive descriptions are based on the AACN consensus conference statement on uniform labeling (Guilmette et al., 2020).  PREMORBID FUNCTIONING RAW  RANGE  TOPF 49 StdS=106 Average  ATTENTION & WORKING MEMORY RAW  RANGE  WAIS-5 Digits Forward -- ss=11 Average  WAIS-5 Digits Backward -- ss=14 High Average  WAIS-5 Digit Sequencing -- ss=9 Average  PROCESSING SPEED RAW  RANGE  Trails A 30''0e T=53 Average  WAIS-5 Coding  -- ss=7 Low Average  WAIS-5 Symbol Search -- ss=6 Low Average  EXECUTIVE FUNCTION RAW  RANGE  Trails B 118''1e T=43 Average  WAIS-5 Matrix Reasoning -- ss=11 Average  WAIS-5 Similarities -- ss=8 Average  COWAT Letter Fluency 17+12+13 T=54 Average  WCST-64 Total Errors 28 T=41 Low Average  WCST-64 Perseverative Errors 13 T=47 Average  WCST-64 Nonperseverative Errors 15 T=38 Low Average  WCST-64 Categories Completed 2 >16%ile WNL  WCSR-64 FMS 0 -- --  LANGUAGE RAW  RANGE  COWAT Letter Fluency 17+12+13 T=54 Average  Animal Naming Test 13 T=36 Below Average  NAB Naming Test 31/31 T=58 WNL  VISUOSPATIAL RAW  RANGE  WAIS-5 Block Design -- ss=7 Low Average  BVMT-R Copy Trial 11/12 -- WNL  VERBAL LEARNING & MEMORY RAW  RANGE  HVLT-R Learning Trials (7+8+8)/36 T=48 Average  HVLT-R Delayed Recall 6/12 T=41 Low Average  HVLT-R Recognition Hits 12 -- --  HVLT-R Recognition False Positives 1 -- --  HVLT-R Discrimination Index 11 T=53 Average  WMS-IV LM-I  (11+13+14)/53 ss=13 High Average  WMS-IV LM-II  (8+11)/39 ss=11 Average  WMS-IV LM Recognition  (8+13)/23 >75%ile High Average to Exceptionally High  VISUAL LEARNING & MEMORY RAW  RANGE  BVMT-R Total Recall (4+8+9)/36 T=50 Average  BVMT-R Delayed Recall 10/12 T=59 High Average  BVMT-R Percent Retained 100 >16%ile WNL  BVMT-R Recognition Hits 5 >16%ile WNL  BVMT-R  Recognition False Alarms 1 11-16%ile Low Average  BVMT-R Recognition Discrimination Index 4 11-16%ile Low Average  QUESTIONNAIRES RAW  RANGE  BDI 19 -- Mild  GDS-SF 6 -- Mild  GAS-10 13 -- Severe  *Note: ss = scaled score; StdS = standard score; T = t-score; C/S = corrected raw score; WNL = within normal limits; BNL= below normal limits; D/C = discontinued. Scores from skewed distributions are typically interpreted as WNL (>=16th %ile) or BNL (<16th %ile).   INFORMED CONSENT   Patient was provided with a verbal description of the nature and purpose of the neuropsychological evaluation. Also reviewed were the foreseeable risks and/or discomforts and benefits of the procedure, limits of confidentiality, and mandatory reporting requirements of this provider. Patient was given the opportunity to have their questions answered. Oral consent to participate was provided by the patient.   This report was prepared as part of a clinical evaluation and is not intended for forensic use.  SERVICE   This evaluation was conducted by Renda Beckwith, Psy.D. In addition to time spent directly with the patient, total professional time (180 minutes) includes record review, integration of relevant medical history, test selection, interpretation of findings, and report preparation. A technician, Evalene Pizza, B.S., provided testing and scoring assistance (182 minutes).  Psychiatric Diagnostic Evaluation Services (Professional): 09208 x 1 Neuropsychological Testing Evaluation Services (Professional): 03867 x 1 Neuropsychological Testing Evaluation Services (Professional): 03866 x 2 Neuropsychological Test Administration and Scoring (Technician): [phone] x 1 Neuropsychological Test Administration and Scoring (Technician): [phone] x 5  This report was generated using voice recognition software. While this document has been carefully reviewed, transcription errors may be present. I apologize in advance for any  inconvenience. Please contact me if further clarification is needed.            Renda Beckwith, Psy.D.             Neuropsychologist

## 2024-05-23 ENCOUNTER — Encounter (HOSPITAL_COMMUNITY): Payer: Self-pay | Admitting: Licensed Clinical Social Worker

## 2024-05-23 NOTE — Progress Notes (Signed)
 Virtual virtual Video Note  I connected with Alex [name] on 05/12/2024 at  2-3pm EST by video-enabled virtual visit. I verified that I am speaking with the correct person using  two identifiers.I discussed the limitations of evaluation and management by telemedicine and the availability of in person appointments. The patient expressed understanding and agreed to proceed.    LOCATION: Patient: Home  Provider: Home office   History of Present Illness:  Pt was referred by Dr. Arfeen for OP therapy for bipolar disorder and anxiety.  Treatment Goal Addressed:  Pt will meet with clinician weekly for therapy to monitor for progress towards goals and address any barriers to success; Reduce depression from average severity level of 6/10 down to a 4/10 in next 6 months by engaging in 1-2 positive coping skills daily as part of developing self-care routine; Reduce average anxiety level from 7/10 down to 5/10 in next 6 months by utilizing 1-2 relaxation skills/grounding skills per day, such as mindful breathing, progressive muscle relaxation, positive visualizations.  Progress towards Treatment Goal: Progressing   Observations/Objective: Patient presented for today's session on time and was alert, oriented x5, with no evidence or self-report of SI/HI or A/V H.  Patient reported ongoing compliance with medication and denied any use of alcohol  or illicit substances. Clinician inquired about patient's current emotional ratings, as well as any significant changes in thoughts, feelings or behavior since previous session. Patient reported scores of  7/10 for depression, 7/10 for anxiety, 3/10 for anger/irritability. [name] and pt explored his emotional ratings, and discussed coping skills for his stressors. Pt identifies stressors and  allowed pt to explore and express thoughts and feelings associated with recent life situations and external stressors. 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 [name] asked open ended questions. Pt reports on continued stressors and cln provided appropriate coping skills for pt to use. Next week we are taking a family trip to the beach. Pt reports positively from family beach vacation trip. The communication was positive, everyone got along, had lots of fun with grandkids. My stress level was minimal and used coping skills practiced in session. Now I'm back to my normal everyday stressors. [name] asked open ended questions and practiced coping skills.    Assessment and plan: Counselor will continue to meet with patient to address treatment plan goals. Patient will continue to follow recommendations of providers and imnd implement skills learned in session, and practice between sessions. Diagnosis: Bipolar 1 disorder.    Collaboration of care: Other:   Continue working with providers    Patient/Guardian was advised Release of Information must be obtained prior to any record release in order to collaborate their care with an outside provider. Patient/Guardian was advised if they have not already done so to contact the registration department to sign all necessary forms in order for us  to release information regarding their care.    Consent: Patient/Guardian gives verbal consent for treatment and assignment of benefits for services provided during this visit. Patient/Guardian expressed understanding and agreed to proceed.     Follow Up Instructions:  I discussed the assessment and treatment plan with the patient. The patient was provided an opportunity to ask questions and all were answered. The patient agreed with the plan and demonstrated an understanding of the instructions.   The patient was advised to call back or seek an  in-person evaluation if the symptoms worsen or if the condition fails  to improve as anticipated.  I provided 60 minutes of non-face-to-face time during this encounter.   Alex [name] S, LCAS 05/12/24

## 2024-05-25 ENCOUNTER — Ambulatory Visit (HOSPITAL_COMMUNITY): Admitting: Licensed Clinical Social Worker

## 2024-05-26 ENCOUNTER — Ambulatory Visit (HOSPITAL_COMMUNITY): Admitting: Licensed Clinical Social Worker

## 2024-05-26 ENCOUNTER — Ambulatory Visit (INDEPENDENT_AMBULATORY_CARE_PROVIDER_SITE_OTHER): Admitting: Licensed Clinical Social Worker

## 2024-05-26 DIAGNOSIS — F319 Bipolar disorder, unspecified: Secondary | ICD-10-CM | POA: Diagnosis not present

## 2024-05-27 DIAGNOSIS — C61 Malignant neoplasm of prostate: Secondary | ICD-10-CM | POA: Diagnosis not present

## 2024-05-27 DIAGNOSIS — N401 Enlarged prostate with lower urinary tract symptoms: Secondary | ICD-10-CM | POA: Diagnosis not present

## 2024-05-28 ENCOUNTER — Ambulatory Visit (INDEPENDENT_AMBULATORY_CARE_PROVIDER_SITE_OTHER): Admitting: Psychology

## 2024-05-28 DIAGNOSIS — R419 Unspecified symptoms and signs involving cognitive functions and awareness: Secondary | ICD-10-CM | POA: Diagnosis not present

## 2024-05-28 NOTE — Progress Notes (Signed)
   NEUROPSYCHOLOGY FEEDBACK SESSION [name] of Neurology  Date of Feedback Session: 05/28/2024  REASON FOR REFERRAL   Alex [name] is a 68 year old, right-handed, White male with 14 years of formal education. He was referred for neuropsychological evaluation by Camie Sevin, PA-C, to assess current neurocognitive functioning, document potential cognitive deficits, and assist with treatment planning. This is his first neuropsychological evaluation.   FEEDBACK   Patient completed a comprehensive neuropsychological evaluation on 05/18/2024. Please refer to that encounter for the full report and recommendations. Briefly, results indicated normal cognitive functioning. There is no evidence of a neurocognitive disorder at this time. His overall profile reflects both well-preserved cognitive and functional abilities. 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    Today, the patient was unaccompanied. He was provided verbal feedback regarding the findings and impression during this visit, and his questions were answered. A copy of the report was provided at the conclusion of the visit.  DISPOSITION   No follow-up neuropsychological testing was scheduled at this time. Please feel free to refer the patient for repeated evaluation if he shows a significant change in neurocognitive status.  SERVICE   This feedback session was conducted by Renda Beckwith, Psy.D. [place] of 03867 (35 minutes) was billed for Dr. Beckwith' time spent in preparing, conducting, and documenting the current feedback session.  This report was generated using voice recognition software. While this document has been carefully  reviewed, transcription errors may be present. I apologize in advance for any inconvenience. Please contact me if further clarification is needed.

## 2024-05-29 DIAGNOSIS — M65811 Other synovitis and tenosynovitis, right shoulder: Secondary | ICD-10-CM | POA: Diagnosis not present

## 2024-05-29 DIAGNOSIS — Z01818 Encounter for other preprocedural examination: Secondary | ICD-10-CM | POA: Diagnosis not present

## 2024-05-29 DIAGNOSIS — M7551 Bursitis of right shoulder: Secondary | ICD-10-CM | POA: Diagnosis not present

## 2024-05-29 DIAGNOSIS — M75111 Incomplete rotator cuff tear or rupture of right shoulder, not specified as traumatic: Secondary | ICD-10-CM | POA: Diagnosis not present

## 2024-05-29 DIAGNOSIS — M19011 Primary osteoarthritis, right shoulder: Secondary | ICD-10-CM | POA: Diagnosis not present

## 2024-05-31 ENCOUNTER — Encounter (HOSPITAL_COMMUNITY): Payer: Self-pay | Admitting: Licensed Clinical Social Worker

## 2024-05-31 NOTE — Progress Notes (Signed)
 Virtual virtual Video Note  I connected with Alex [name] on 05/26/2024 at  2-3pm EST by video-enabled virtual visit. I verified that I am speaking with the correct person using  two identifiers.I discussed the limitations of evaluation and management by telemedicine and the availability of in person appointments. The patient expressed understanding and agreed to proceed.    LOCATION: Patient: Home  Provider: Home office   History of Present Illness:  Pt was referred by Dr. Arfeen for OP therapy for bipolar disorder and anxiety.  Treatment Goal Addressed:  Pt will meet with clinician weekly for therapy to monitor for progress towards goals and address any barriers to success; Reduce depression from average severity level of 6/10 down to a 4/10 in next 6 months by engaging in 1-2 positive coping skills daily as part of developing self-care routine; Reduce average anxiety level from 7/10 down to 5/10 in next 6 months by utilizing 1-2 relaxation skills/grounding skills per day, such as mindful breathing, progressive muscle relaxation, positive visualizations.  Progress towards Treatment Goal: Progressing   Observations/Objective: Patient presented for today's session on time and was alert, oriented x5, with no evidence or self-report of SI/HI or A/V H.  Patient reported ongoing compliance with medication and denied any use of alcohol  or illicit substances. Clinician inquired about patient's current emotional ratings, as well as any significant changes in thoughts, feelings or behavior since previous session. Patient reported scores of  7/10 for depression, 7/10 for anxiety, 3/10 for anger/irritability. [name] and pt explored his emotional ratings, and discussed coping skills for his stressors. Pt identifies stressors and  allowed pt to explore and express thoughts and feelings associated with recent life situations and external stressors. [name] and pt reviewed his continued stressors individually:  marital issues, unfair division of martial finances, son's mental health, physical health, future, pt's sexual health after prostate cancer, lack of marital communication. [name] asked open ended questions. Pt reports on continued stressors and cln provided appropriate coping skills for pt to use. We moved my last child out of the house to her own apt. We are concerned about her living situation, but she needs to be independent and learn to live on her own. [name] asked open ended questions. Clinician utilized [facility] OARS to assist pt in reflecting  and summarizing thoughts and feelings.  Assessment and plan: Counselor will continue to meet with patient to address treatment plan goals. Patient will continue to follow recommendations of providers and imnd implement skills learned in session, and practice between sessions. Diagnosis: Bipolar 1 disorder.    Collaboration of care: Other:   Continue working with providers    Patient/Guardian was advised Release of Information must be obtained prior to any record release in order to collaborate their care with an outside provider. Patient/Guardian was advised if they have not already done so to contact the registration department to sign all necessary forms in order for us  to release information regarding their care.    Consent: Patient/Guardian gives verbal consent for treatment and assignment of benefits for services provided during this visit. Patient/Guardian expressed understanding and agreed to proceed.     Follow Up Instructions:  I discussed the assessment and treatment plan with the patient. The patient was provided an opportunity to ask questions and all were answered. The patient agreed with the plan and demonstrated an understanding of the instructions.   The patient was advised to call back or seek an in-person evaluation if the symptoms worsen or if the condition  fails to improve as anticipated.  I provided 60 minutes of non-face-to-face time  during this encounter.   Harvest Deist S, LCAS 05/26/24

## 2024-06-01 ENCOUNTER — Other Ambulatory Visit: Payer: Self-pay

## 2024-06-01 ENCOUNTER — Emergency Department (HOSPITAL_COMMUNITY)
Admission: EM | Admit: 2024-06-01 | Discharge: 2024-06-01 | Disposition: A | Discharge status: Home / Self Care | Attending: Emergency Medicine | Admitting: Emergency Medicine

## 2024-06-01 ENCOUNTER — Emergency Department (HOSPITAL_COMMUNITY)

## 2024-06-01 ENCOUNTER — Encounter (HOSPITAL_COMMUNITY): Payer: Self-pay

## 2024-06-01 ENCOUNTER — Ambulatory Visit: Admitting: Physician Assistant

## 2024-06-01 DIAGNOSIS — N132 Hydronephrosis with renal and ureteral calculous obstruction: Secondary | ICD-10-CM | POA: Diagnosis not present

## 2024-06-01 DIAGNOSIS — S0990XA Unspecified injury of head, initial encounter: Secondary | ICD-10-CM | POA: Diagnosis not present

## 2024-06-01 DIAGNOSIS — S199XXA Unspecified injury of neck, initial encounter: Secondary | ICD-10-CM | POA: Diagnosis not present

## 2024-06-01 DIAGNOSIS — R55 Syncope and collapse: Secondary | ICD-10-CM | POA: Insufficient documentation

## 2024-06-01 DIAGNOSIS — Z7982 Long term (current) use of aspirin: Secondary | ICD-10-CM | POA: Diagnosis not present

## 2024-06-01 DIAGNOSIS — R109 Unspecified abdominal pain: Secondary | ICD-10-CM | POA: Diagnosis not present

## 2024-06-01 DIAGNOSIS — R10A2 Flank pain, left side: Secondary | ICD-10-CM | POA: Diagnosis not present

## 2024-06-01 DIAGNOSIS — R42 Dizziness and giddiness: Secondary | ICD-10-CM | POA: Diagnosis not present

## 2024-06-01 DIAGNOSIS — Z8546 Personal history of malignant neoplasm of prostate: Secondary | ICD-10-CM | POA: Diagnosis not present

## 2024-06-01 DIAGNOSIS — I4430 Unspecified atrioventricular block: Secondary | ICD-10-CM | POA: Diagnosis not present

## 2024-06-01 DIAGNOSIS — S0181XA Laceration without foreign body of other part of head, initial encounter: Secondary | ICD-10-CM | POA: Diagnosis not present

## 2024-06-01 DIAGNOSIS — R1032 Left lower quadrant pain: Secondary | ICD-10-CM | POA: Diagnosis not present

## 2024-06-01 DIAGNOSIS — N201 Calculus of ureter: Secondary | ICD-10-CM | POA: Diagnosis not present

## 2024-06-01 DIAGNOSIS — J45909 Unspecified asthma, uncomplicated: Secondary | ICD-10-CM | POA: Insufficient documentation

## 2024-06-01 LAB — URINALYSIS, ROUTINE W REFLEX MICROSCOPIC
Bilirubin Urine: NEGATIVE
Glucose, UA: NEGATIVE mg/dL
Ketones, ur: NEGATIVE mg/dL
Leukocytes,Ua: NEGATIVE
Nitrite: NEGATIVE
Protein, ur: NEGATIVE mg/dL
RBC / HPF: >50 RBC/hpf (ref 0–5)
Specific Gravity, Urine: 1.018 (ref 1.005–1.030)
pH: 6 (ref 5.0–8.0)

## 2024-06-01 LAB — COMPREHENSIVE METABOLIC PANEL WITH GFR
ALT: 25 U/L (ref 0–44)
AST: 32 U/L (ref 15–41)
Albumin: 4 g/dL (ref 3.5–5.0)
Alkaline Phosphatase: 90 U/L (ref 38–126)
Anion gap: 10 (ref 5–15)
BUN: 19 mg/dL (ref 8–23)
CO2: 25 mmol/L (ref 22–32)
Calcium: 8.7 mg/dL — ABNORMAL LOW (ref 8.9–10.3)
Chloride: 102 mmol/L (ref 98–111)
Creatinine, Ser: 1.22 mg/dL (ref 0.61–1.24)
GFR, Estimated: >60 mL/min (ref >60)
Glucose, Bld: 158 mg/dL — ABNORMAL HIGH (ref 70–99)
Potassium: 4.9 mmol/L (ref 3.5–5.1)
Sodium: 137 mmol/L (ref 135–145)
Total Bilirubin: 0.7 mg/dL (ref 0.0–1.2)
Total Protein: 6.6 g/dL (ref 6.5–8.1)

## 2024-06-01 LAB — CBC
HCT: 45.1 % (ref 39.0–52.0)
Hemoglobin: 14.7 g/dL (ref 13.0–17.0)
MCH: 27.1 pg (ref 26.0–34.0)
MCHC: 32.6 g/dL (ref 30.0–36.0)
MCV: 83.1 fL (ref 80.0–100.0)
Platelets: 219 K/uL (ref 150–400)
RBC: 5.43 MIL/uL (ref 4.22–5.81)
RDW: 16.6 % — ABNORMAL HIGH (ref 11.5–15.5)
WBC: 11.8 K/uL — ABNORMAL HIGH (ref 4.0–10.5)
nRBC: 0 % (ref 0.0–0.2)

## 2024-06-01 LAB — CBG MONITORING, ED: Glucose-Capillary: 158 mg/dL — ABNORMAL HIGH (ref 70–99)

## 2024-06-01 LAB — TROPONIN T, HIGH SENSITIVITY: Troponin T High Sensitivity: <15 ng/L (ref 0–19)

## 2024-06-01 MED ORDER — SODIUM CHLORIDE 0.9 % IV BOLUS
500.0000 mL | Freq: Once | INTRAVENOUS | Status: AC
  Administered 2024-06-01: 500 mL via INTRAVENOUS

## 2024-06-01 MED ORDER — TAMSULOSIN HCL 0.4 MG PO CAPS: 0.4000 mg | ORAL_CAPSULE | Freq: Every day | ORAL | 1 refills | Status: AC

## 2024-06-01 MED ORDER — KETOROLAC TROMETHAMINE 10 MG PO TABS: 10.0000 mg | ORAL_TABLET | Freq: Four times a day (QID) | ORAL | 0 refills | Status: DC | PRN

## 2024-06-01 MED ORDER — HYDROMORPHONE HCL 1 MG/ML IJ SOLN
1.0000 mg | Freq: Once | INTRAMUSCULAR | Status: AC
  Administered 2024-06-01: 1 mg via INTRAVENOUS
  Filled 2024-06-01: qty 1

## 2024-06-01 MED ORDER — ONDANSETRON HCL 4 MG/2ML IJ SOLN
4.0000 mg | Freq: Once | INTRAMUSCULAR | Status: AC
  Administered 2024-06-01: 4 mg via INTRAVENOUS
  Filled 2024-06-01: qty 2

## 2024-06-01 MED ORDER — ONDANSETRON 8 MG PO TBDP: 8.0000 mg | ORAL_TABLET | Freq: Three times a day (TID) | ORAL | 0 refills | Status: DC | PRN

## 2024-06-01 MED ORDER — BACITRACIN ZINC 500 UNIT/GM EX OINT
TOPICAL_OINTMENT | Freq: Once | CUTANEOUS | Status: AC
  Administered 2024-06-01: 1 via TOPICAL
  Filled 2024-06-01: qty 0.9

## 2024-06-01 MED ORDER — IOHEXOL 300 MG/ML  SOLN
100.0000 mL | Freq: Once | INTRAMUSCULAR | Status: AC | PRN
  Administered 2024-06-01: 88 mL via INTRAVENOUS

## 2024-06-01 MED ORDER — OXYCODONE HCL 5 MG PO TABS: 5.0000 mg | ORAL_TABLET | Freq: Four times a day (QID) | ORAL | 0 refills | Status: DC | PRN

## 2024-06-01 NOTE — ED Provider Notes (Signed)
 [place] EMERGENCY DEPARTMENT AT [facility] Provider Note   CSN: [id number] Arrival date & time: 06/01/24  1012     Patient presents with: Loss of Consciousness   Alex [name] is a 68 y.o. male.    Loss of Consciousness    Patient has a history of common variable immunodeficiency, esophageal stricture, lumbago, anxiety, bronchiectasis, kidney stones, asthma, irritable bowel syndrome, intraductal papillary mucinous neoplasm.  Patient states he was eating breakfast and started having some pain in his left flank area.  Next thing he recalls is having a syncopal episode where he ended up on the ground.  Patient states been having pain in his left flank.  He feels nauseated with it.  He also had a vomiting episode.  Feels like when he had kidney stones in the past.  He is not currently having any headache or neck pain.  No chest pain or shortness of breath.  The pain in his abdomen is severe and is starting to return again  Prior to Admission medications   Medication Sig Start Date End Date Taking? Authorizing Provider  acetaminophen  (TYLENOL ) 500 MG tablet Take by mouth.    [provider]  aspirin  EC 81 MG tablet Take 1 tablet by mouth daily.    [provider]  atorvastatin  (LIPITOR) 40 MG tablet TAKE 1 TABLET BY MOUTH EVERY DAY 04/16/24   Dick, Ernest H Jr., NP  AXIRON  30 MG/ACT SOLN Place 2 Act onto the skin daily. 06/28/15   [provider]  brimonidine (ALPHAGAN) 0.2 % ophthalmic solution SMARTSIG:In Eye(s)    [provider]  Coenzyme Q10 (CO Q 10 PO) Take 1 capsule by mouth daily.     [provider]  EPINEPHrine  0.3 mg/0.3 mL IJ SOAJ injection Inject 0.3 mg into the muscle as needed.    [provider]  erythromycin ophthalmic ointment Place into the right eye at bedtime. 07/30/22   [provider]  furosemide  (LASIX ) 20 MG tablet TAKE 1 TO 2 TABLETS BY MOUTH DAILY AS NEEDED 05/26/23   Plotnikov, Aleksei  V, MD  Immune Globulin, Human, 4 GM/20ML SOLN Inject into the skin once a week. wednesday    [provider]  LamoTRIgine  300 MG TB24 24 hour tablet Take 1 tablet (300 mg total) by mouth every morning. 04/14/24   Arfeen, Leni DASEN, MD  latanoprost  (XALATAN ) 0.005 % ophthalmic solution 1 DROP EACH EYE AT NIGHT    [provider]  levothyroxine  (SYNTHROID ) 150 MCG tablet TAKE 1 TABLET BY MOUTH DAILY BEFORE BREAKFAST. 04/16/24   Plotnikov, Karlynn GAILS, MD  meloxicam  (MOBIC ) 15 MG tablet TAKE 1 TABLET BY MOUTH EVERY DAY AS NEEDED 02/28/22   Plotnikov, Aleksei V, MD  Multiple Vitamin (MULTI-VITAMINS) TABS Take by mouth.    [provider]  nitroGLYCERIN  (NITROLINGUAL ) 0.4 MG/SPRAY spray Place 1 spray under the tongue every 5 (five) minutes x 3 doses as needed for chest pain. Esophageal spasms 04/01/24   Plotnikov, Karlynn GAILS, MD  OMEGA-3 KRILL OIL PO Take 1 capsule by mouth daily.     [provider]  pantoprazole  (PROTONIX ) 40 MG tablet Take 1 tablet (40 mg total) by mouth daily. 04/01/24   Plotnikov, Aleksei V, MD  propranolol  (INDERAL ) 40 MG tablet Take 1 tablet (40 mg total) by mouth 2 (two) times daily. 04/23/24   Pietro Redell RAMAN, MD  sildenafil  (REVATIO ) 20 MG tablet Take 1 tablet (20 mg total) by mouth as needed. 03/31/23   Burns,  Glade PARAS, MD  tadalafil  (CIALIS ) 5 MG tablet Take 5 mg by mouth.    [provider]  tamsulosin  (FLOMAX ) 0.4 MG CAPS capsule TAKE 1 CAPSULE BY MOUTH EVERY DAY Patient taking differently: as needed. 06/07/19   Plotnikov, Aleksei V, MD  telmisartan  (MICARDIS ) 80 MG tablet Take 1 tablet (80 mg total) by mouth daily. 09/18/23   Pietro Redell RAMAN, MD  timolol  (TIMOPTIC ) 0.5 % ophthalmic solution timolol  maleate 0.5 % eye drops  INSTILL 1 DROP INTO RIGHT EYE TWICE A DAY 10/05/19   [provider]  traMADol  (ULTRAM ) 50 MG tablet Take 1 tablet (50 mg total) by mouth every 6 (six) hours as needed for moderate pain (pain score 4-6). 09/04/23    Eletha Boas, MD  zolpidem  (AMBIEN ) 10 MG tablet TAKE 1/2 (ONE-HALF) TABLET BY MOUTH AT BEDTIME AND  TAKE  THE  OTHER  HALF  IF  NEEDED Patient not taking: Reported on 04/14/2024 03/16/24   Arfeen, Leni DASEN, MD    Allergies: Oxycodone -acetaminophen     Review of Systems  Cardiovascular:  Positive for syncope.    Updated Vital Signs BP (!) 149/100   Pulse 78   Temp (!) 95.7 F (35.4 C) (Rectal)   Resp 16   Ht 1.829 m (6')   Wt 83 kg   SpO2 96%   BMI 24.82 kg/m   Physical Exam Vitals and nursing note reviewed.  Constitutional:      Appearance: He is well-developed. He is not diaphoretic.  HENT:     Head: Normocephalic.     Comments: Superficial linear abrasion forehead    Right Ear: External ear normal.     Left Ear: External ear normal.  Eyes:     General: No scleral icterus.       Right eye: No discharge.        Left eye: No discharge.     Conjunctiva/sclera: Conjunctivae normal.  Neck:     Trachea: No tracheal deviation.  Cardiovascular:     Rate and Rhythm: Normal rate and regular rhythm.  Pulmonary:     Effort: Pulmonary effort is normal. No respiratory distress.     Breath sounds: Normal breath sounds. No stridor. No wheezing or rales.  Abdominal:     General: Bowel sounds are normal. There is no distension.     Palpations: Abdomen is soft.     Tenderness: There is abdominal tenderness in the left lower quadrant. There is no guarding or rebound.  Musculoskeletal:        General: No tenderness or deformity.     Cervical back: Neck supple.     Comments: No tenderness palpation cervical thoracic or lumbar spine  Skin:    General: Skin is warm and dry.     Findings: No rash.  Neurological:     General: No focal deficit present.     Mental Status: He is alert.     Cranial Nerves: No cranial nerve deficit, dysarthria or facial asymmetry.     Sensory: No sensory deficit.     Motor: No abnormal muscle tone or seizure activity.     Coordination: Coordination normal.   Psychiatric:        Mood and Affect: Mood normal.     (all labs ordered are listed, but only abnormal results are displayed) Labs Reviewed  COMPREHENSIVE METABOLIC PANEL WITH GFR - Abnormal; Notable for the following components:      Result Value   Glucose, Bld 158 (*)  Calcium  8.7 (*)    All other components within normal limits  CBC - Abnormal; Notable for the following components:   WBC 11.8 (*)    RDW 16.6 (*)    All other components within normal limits  URINALYSIS, ROUTINE W REFLEX MICROSCOPIC - Abnormal; Notable for the following components:   Hgb urine dipstick SMALL (*)    Bacteria, UA RARE (*)    All other components within normal limits  CBG MONITORING, ED - Abnormal; Notable for the following components:   Glucose-Capillary 158 (*)    All other components within normal limits  CBG MONITORING, ED  TROPONIN T, HIGH SENSITIVITY  TROPONIN T, HIGH SENSITIVITY    EKG: EKG Interpretation Date/Time:  Tuesday June 01 2024 10:32:36 EDT Ventricular Rate:  57 PR Interval:  214 QRS Duration:  110 QT Interval:  452 QTC Calculation: 441 R Axis:   -40  Text Interpretation: Sinus rhythm Borderline prolonged PR interval Left anterior fascicular block Abnormal R-wave progression, late transition Left ventricular hypertrophy No significant change since last tracing Confirmed by Randol Simmonds [phone]) on 06/01/2024 3:21:13 PM  Radiology: CT ABDOMEN PELVIS W CONTRAST Result Date: 06/01/2024 EXAM: CT ABDOMEN AND PELVIS WITH CONTRAST 06/01/2024 12:51:15 PM TECHNIQUE: CT of the abdomen and pelvis was performed with the administration of 88 mL of iohexol  (OMNIPAQUE ) 300 MG/ML solution. Multiplanar reformatted images are provided for review. Automated exposure control, iterative reconstruction, and/or weight-based adjustment of the mA/kV was utilized to reduce the radiation dose to as low as reasonably achievable. COMPARISON: 11/15/2020 from [facility]. CLINICAL HISTORY:  Abdominal/flank pain, stone suspected; LLQ abdominal pain. Trauma; Abd flank pain, stone suspected; LLQ Abd pain; Omnipaque  300; 88 cc (A/P only); a 68 y.o. male was evaluated in triage. Pt complains of L sided flank pain that started this AM radiating towards groin, similar to previous kidney stones in the past, noting that he was eating breakfast this AM when he had a unwitnessed syncopal episode, woke up on the ground with cereal in his mouth. Hit head. Woke up and called EMS and crawled to open door. Endorses nausea, lightheadedness. Dysuria x 1 week. Reported to have passed kidney stone on own on 10/8. FINDINGS: LOWER CHEST: No acute abnormality. LIVER: Moderate hepatic steatosis. Bilateral hepatic cysts. Hyperenhancement in the subcapsular right hepatic lobe on image 25/2 is likely due to a flash filling hemangioma and perfusion anomaly when correlated with 11/15/2020 exam. GALLBLADDER AND BILE DUCTS: Gallbladder is unremarkable. No biliary ductal dilatation. SPLEEN: No acute abnormality. PANCREAS: No acute abnormality. ADRENAL GLANDS: No acute abnormality. KIDNEYS, URETERS AND BLADDER: Bilateral renal collecting system calculi of up to 3 mm. Moderate left-sided hydronephrosis and hydroureter secondary to two proximal left ureteric stones, the largest of which measures 5 mm. Normal urinary bladder. GI AND BOWEL: Stomach demonstrates no acute abnormality. There is no bowel obstruction. Normal small bowel. The terminal ileum and appendix are normal. PERITONEUM AND RETROPERITONEUM: No ascites. No free air. Beam hardening artifact degrades evaluation of the retroperitoneum secondary to lumbar spine fixation. VASCULATURE: Aorta is normal in caliber. Aortic atherosclerosis. LYMPH NODES: No lymphadenopathy. REPRODUCTIVE ORGANS: Mild prostatomegaly. BONES AND SOFT TISSUES: bilateral lower rib fracture. Transpedicular screw fixation at L2-L5. S-shaped lumbar spine curvature. No focal soft tissue abnormality.  IMPRESSION: 1. Moderate left-sided hydronephrosis and hydroureter due to two proximal left ureteral calculi, largest 5 mm. 2. Additional nonobstructing bilateral renal collecting system calculi up to 3 mm. 3. Incidental findings, including: Hepatic steatosis. aortic atherosclerosis (icd10-i70.0) mild prostatomegaly. Electronically signed  by: Rockey Kilts MD 06/01/2024 02:26 PM EDT RP Workstation: [id number]   CT Cervical Spine Wo Contrast Result Date: 06/01/2024 EXAM: CT CERVICAL SPINE WITHOUT CONTRAST 06/01/2024 12:51:15 PM TECHNIQUE: CT of the cervical spine was performed without the administration of intravenous contrast. Multiplanar reformatted images are provided for review. Automated exposure control, iterative reconstruction, and/or weight based adjustment of the mA/kV was utilized to reduce the radiation dose to as low as reasonably achievable. COMPARISON: Cervical spine CT 05/11/2010 and MRI 03/28/2021. CT neck 08/01/2022. CLINICAL HISTORY: Neck trauma. Unwitnessed syncopal episode, woke up on ground, hit head. Nausea, lightheadedness. FINDINGS: CERVICAL SPINE: BONES AND ALIGNMENT: Straightening of the normal cervical lordosis. Grade 1 anterolisthesis of C2 on C3, C4 on C5, and C7 on T1. Trace retrolisthesis of C5 on C6 and C6 on C7. No acute fracture or suspicious lesion. DEGENERATIVE CHANGES: Advanced disc degeneration from C3-C4 through C6-C7. Partial interbody and facet ankylosis at C3-C4 and C4-C5. Widespread advanced facet arthrosis. At least mild spinal stenosis at C5-C6 and C6-C7. Moderate to severe multilevel neural foraminal stenosis. SOFT TISSUES: Status post thyroidectomy. Post treatment changes in the right neck including asymmetric right parotid gland atrophy and atrophy or absence of the right submandibular gland. No prevertebral soft tissue swelling. IMPRESSION: 1. No acute fracture or traumatic malalignment in the cervical spine. 2. Advanced cervical disc and facet degeneration.  Electronically signed by: Dasie Hamburg MD 06/01/2024 01:27 PM EDT RP Workstation: [id number]   CT HEAD WO CONTRAST Result Date: 06/01/2024 EXAM: CT HEAD WITHOUT CONTRAST 06/01/2024 12:51:15 PM TECHNIQUE: CT of the head was performed without the administration of intravenous contrast. Automated exposure control, iterative reconstruction, and/or weight based adjustment of the mA/kV was utilized to reduce the radiation dose to as low as reasonably achievable. COMPARISON: MRI brain 04/02/2024. CLINICAL HISTORY: Head trauma, abnormal mental status (Age 79-64y). Trauma; Abd flank pain, stone suspected; LLQ Abd pain; Omnipaque  300; 88 cc (A/P only); a 68 y.o. male was evaluated in triage. Pt complains of L sided flank pain that started this AM radiating towards groin, similar to previous kidney stones in the past, noting that he was eating breakfast this AM when he had a unwitnessed syncopal episode, woke up on the ground with cereal in his mouth. Hit head. Woke up and called EMS and crawled to open door. Endorses nausea, lightheadedness. Dysuria x 1 week. Reported to have passed kidney stone on own on 10/8. FINDINGS: BRAIN AND VENTRICLES: No acute hemorrhage. No evidence of acute infarct. No hydrocephalus. No extra-axial collection. No mass effect or midline shift. ORBITS: Right lens replacement. SINUSES: Complete opacification of right sphenoid sinus. Mucosal thickening in left sphenoid sinus. SOFT TISSUES AND SKULL: Soft tissue laceration along right forehead. No skull fracture. IMPRESSION: 1. No acute intracranial abnormality. 2. Soft tissue laceration along the right forehead. 3. Complete opacification of the right sphenoid sinus and mucosal thickening of the left sphenoid sinus, compatible with sinusitis. Electronically signed by: Donnice Mania MD 06/01/2024 12:58 PM EDT RP Workstation: [id number]     Procedures   Medications Ordered in the ED  HYDROmorphone  (DILAUDID ) injection 1 mg (has no administration  in time range)  iohexol  (OMNIPAQUE ) 300 MG/ML solution 100 mL (88 mLs Intravenous Contrast Given 06/01/24 1234)  HYDROmorphone  (DILAUDID ) injection 1 mg (1 mg Intravenous Given 06/01/24 1334)  ondansetron  (ZOFRAN ) injection 4 mg (4 mg Intravenous Given 06/01/24 1333)  sodium chloride  0.9 % bolus 500 mL (500 mLs Intravenous Bolus from Bag 06/01/24 1332)  bacitracin ointment (1 Application Topical Given  06/01/24 1343)    Clinical Course as of 06/01/24 1626  Tue Jun 01, 2024  1408 Head CT C-spine CT without acute abnormality.  Possible sinusitis findings. [JK]  1409 C spine CT without acute changes [JK]  1452 CT scan shows hydronephrosis due to to proximal left ureteral calculi, the largest is 5 mm in size [JK]  1625 Urine culture requested by Dr. Shane.  Will add that on. [JK]    Clinical Course User Index [JK] Randol Simmonds, MD                                 Medical Decision Making Problems Addressed: Ureteral stone with hydronephrosis: acute illness or injury that poses a threat to life or bodily functions Vasovagal syncope: acute illness or injury that poses a threat to life or bodily functions  Amount and/or Complexity of Data Reviewed Labs: ordered. Decision-making details documented in ED Course. Radiology: ordered and independent interpretation performed.  Risk OTC drugs. Prescription drug management. Parenteral controlled substances. Decision regarding hospitalization.   Patient presented to the ER for evaluation of a syncopal episode.  Patient reported acute flank pain today associated with that episode.  Patient did have an injury to his head.  In the ED he was alert and oriented normotensive.  CT scan does not show any signs of serious head injury.  Patient cervical spine does not show any acute acute injury.  Patient's laboratory test do not show any anemia.  No signs of cardiac injury.  EKG does not show any dysrhythmia.  Patient CT scan however does show 2  ureteral stones.  Patient was treated with IV narcotics and antiemetics.  I suspect his syncopal episode was related to his kidney stones and flank pain.  Presentation consistent with vasovagal syncope.  Patient was seen down in the ED by Dr. Shane.  Plain film ordered for outpt folow up plans. He plans on taking the patient for intervention on Thursday.  Evaluation and diagnostic testing in the emergency department does not suggest an emergent condition requiring admission or immediate intervention beyond what has been performed at this time.  The patient is safe for discharge and has been instructed to return immediately for worsening symptoms, change in symptoms or any other concerns.      Final diagnoses:  Vasovagal syncope  Ureteral stone with hydronephrosis    ED Discharge Orders     None          Randol Simmonds, MD 06/01/24 [phone]

## 2024-06-01 NOTE — ED Notes (Signed)
 Pt to CT

## 2024-06-01 NOTE — Consult Note (Signed)
 I have been asked to see the patient by Dr. Randol, for evaluation and management of Ureteral stone.  History of present illness: 68 year old male with history of prostate cancer status post cryoablation, testosterone  deficiency managed with T replacement and urethral stricture.  Has a long history of kidney stones presented to the ER after having left-sided flank pain and nausea and fainting.  Evaluation of his head and neck was negative.  He did have emesis earlier today but has had none since then.  Denie fevers chills.    CT shows left mid ureteral stone x 2.  Can be seen on KUB.  UA CBC and BMP all within normal notes.   Review of systems: A 12 point comprehensive review of systems was obtained and is negative unless otherwise stated in the history of present illness.  Patient Active Problem List   Diagnosis Date Noted   Esophageal spasm 04/01/2024   GERD (gastroesophageal reflux disease) 04/01/2024   Memory changes 04/01/2024   Primary osteoarthritis of both knees 09/22/2023   Boil 08/26/2023   Abscess 08/20/2023   Cellulitis 05/12/2023   Bruise of lower lip 04/17/2023   Wheezing 03/31/2023   Injury of medial collateral ligament (MCL) of knee 02/07/2023   Sprain of medial collateral ligament of right knee 02/07/2023   Osteoarthritis of subtalar joint 09/25/2022   Cystoid macular edema of right eye 07/18/2022   Hyperglycemia 07/18/2022   Nonexudative age-related macular degeneration, left eye, early dry stage 07/18/2022   Retinal edema 07/18/2022   Superficial punctate keratitis of right eye 07/18/2022   Grief at loss of child 05/22/2022   Actinic keratoses 05/22/2022   Pain in joint of left elbow 04/04/2022   Abnormal defecation 02/26/2022   Atrophic gastritis 02/26/2022   Bipolar 1 disorder (HCC) 02/26/2022   BPH (benign prostatic hyperplasia) 02/26/2022   Bronchiolectasis (HCC) 02/26/2022   Disease of thyroid  gland 02/26/2022   Diverticular disease of colon 02/26/2022    Family history of malignant neoplasm of digestive organs 02/26/2022   Hyperlipidemia 02/26/2022   IgG deficiency (HCC) 02/26/2022   Immune deficiency disorder 02/26/2022   Irritable bowel syndrome 02/26/2022   Pancreatic cyst 02/26/2022   History of colonic polyps 02/26/2022   Portosystemic shunt, 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/26/2022   Pyloric ulcer 02/26/2022   Scoliosis 02/26/2022   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   Spinal stenosis of lumbar region 02/26/2022   Prostate cancer (HCC) 02/26/2022   Dysuria 10/03/2021   Earache 06/14/2021   Anemia 06/14/2021   History of head and neck cancer 03/28/2021   Swelling of right parotid gland 03/28/2021   Epistaxis, recurrent 06/26/2020   PMR (polymyalgia rheumatica) 02/22/2020   Diverticulitis 01/25/2020   Abdominal pain 01/25/2020   Iliac vessel injury 11/30/2019   Stress at home 09/14/2019   LLQ abdominal pain 09/06/2019   IPMN (intraductal papillary mucinous neoplasm) 07/19/2019   History of thyroid  cancer 06/03/2019   Abnormal findings on diagnostic imaging of liver 06/03/2019   Epididymitis 03/16/2019   Difficulty urinating 03/16/2019   Pain in right foot 03/12/2019   Hypothyroidism (acquired) 09/29/2018   Moderate persistent asthma with acute exacerbation 09/29/2018   Prediabetes 09/29/2018   RTI (respiratory tract infection) 09/29/2018   ETD (Eustachian tube dysfunction), bilateral 09/02/2018   Sensorineural hearing loss (SNHL) of both ears 07/31/2018   Bruising 11/17/2017   Encounter for other orthopedic aftercare 11/14/2017   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   Closed fracture of head of right radius 10/24/2017  Palatal mass 09/16/2017   Sore throat 09/16/2017   Testicular pain, right 08/01/2017   Combined form of age-related cataract, left eye 05/19/2017   Dry eye syndrome of both eyes 05/19/2017   Primary open angle glaucoma (POAG) of left eye, mild stage  05/19/2017   Radiation retinopathy, 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   Secondary glaucoma due to combination mechanisms, right, severe stage 05/19/2017   Fatigue 01/21/2017   Ankle pain, left 01/21/2017   Fall (on) (from) other stairs and steps, initial encounter 11/11/2016   Abrasion of head 11/11/2016   Ascending aortic aneurysm 07/23/2016   Erectile dysfunction 07/19/2016   Bronchiectasis (HCC) 04/23/2016   Chronic bronchitis (HCC) 03/11/2016   Cerumen impaction 08/04/2015   Bilateral impacted cerumen 08/04/2015   Cervical disc disorder with radiculopathy of cervical region 06/06/2014   Elevated WBC count 06/06/2014   Iron  deficiency anemia 06/06/2014   Edema 02/21/2014   Tinnitus 02/21/2014   Well adult exam 05/31/2013   Glaucoma 05/31/2013   RML pneumonia 03/31/2013   Hypertension, uncontrolled 02/25/2013   Anxiety disorder 08/08/2010   Chest pain, atypical 02/08/2010   ESOPHAGEAL STRICTURE 11/30/2009   Osteoarthritis 08/07/2009   Malaise and fatigue 05/14/2008   Coronary atherosclerosis 11/04/2007   Lumbago 11/04/2007   THYROIDECTOMY, HX OF 11/04/2007   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    No current facility-administered medications on file prior to encounter.   Current Outpatient Medications on File Prior to Encounter  Medication Sig Dispense Refill   acetaminophen  (TYLENOL ) 500 MG tablet Take by mouth.     aspirin  EC 81 MG tablet Take 1 tablet by mouth daily.     atorvastatin  (LIPITOR) 40 MG tablet TAKE 1 TABLET BY MOUTH EVERY DAY 90 tablet 3   AXIRON  30 MG/ACT SOLN Place 2 Act onto the skin daily.     brimonidine (ALPHAGAN) 0.2 % ophthalmic solution SMARTSIG:In Eye(s)     Coenzyme Q10 (CO Q 10 PO) Take 1 capsule by mouth daily.      EPINEPHrine  0.3 mg/0.3 mL IJ SOAJ injection Inject 0.3 mg into the muscle as needed.     erythromycin ophthalmic ointment Place into the right eye at bedtime.     furosemide  (LASIX ) 20 MG tablet TAKE 1 TO 2 TABLETS BY MOUTH DAILY  AS NEEDED 180 tablet 1   Immune Globulin, Human, 4 GM/20ML SOLN Inject into the skin once a week. wednesday     LamoTRIgine  300 MG TB24 24 hour tablet Take 1 tablet (300 mg total) by mouth every morning. 90 tablet 0   latanoprost  (XALATAN ) 0.005 % ophthalmic solution 1 DROP EACH EYE AT NIGHT     levothyroxine  (SYNTHROID ) 150 MCG tablet TAKE 1 TABLET BY MOUTH DAILY BEFORE BREAKFAST. 90 tablet 2   meloxicam  (MOBIC ) 15 MG tablet TAKE 1 TABLET BY MOUTH EVERY DAY AS NEEDED 90 tablet 0   Multiple Vitamin (MULTI-VITAMINS) TABS Take by mouth.     nitroGLYCERIN  (NITROLINGUAL ) 0.4 MG/SPRAY spray Place 1 spray under the tongue every 5 (five) minutes x 3 doses as needed for chest pain. Esophageal spasms 12 g 12   OMEGA-3 KRILL OIL PO Take 1 capsule by mouth daily.      pantoprazole  (PROTONIX ) 40 MG tablet Take 1 tablet (40 mg total) by mouth daily. 90 tablet 11   propranolol  (INDERAL ) 40 MG tablet Take 1 tablet (40 mg total) by mouth 2 (two) times daily. 180 tablet 3   sildenafil  (REVATIO ) 20 MG tablet Take 1 tablet (20 mg total) by mouth as  needed. 30 tablet 1   tadalafil  (CIALIS ) 5 MG tablet Take 5 mg by mouth.     telmisartan  (MICARDIS ) 80 MG tablet Take 1 tablet (80 mg total) by mouth daily. 90 tablet 2   timolol  (TIMOPTIC ) 0.5 % ophthalmic solution timolol  maleate 0.5 % eye drops  INSTILL 1 DROP INTO RIGHT EYE TWICE A DAY     traMADol  (ULTRAM ) 50 MG tablet Take 1 tablet (50 mg total) by mouth every 6 (six) hours as needed for moderate pain (pain score 4-6). 12 tablet 0   zolpidem  (AMBIEN ) 10 MG tablet TAKE 1/2 (ONE-HALF) TABLET BY MOUTH AT BEDTIME AND  TAKE  THE  OTHER  HALF  IF  NEEDED (Patient not taking: Reported on 04/14/2024) 30 tablet 0    Past Medical History:  Diagnosis Date   Anemia    Anxiety    Atypical nevus 04/12/1997   dyplastic-left chest below nipple   Atypical nevus 01/18/2005   slight-mod-mid upper abd, slight-mod-right lateral chest-(WS), slight-mod-mid lower back (punch)    Atypical nevus 05/31/2005   dysplastic-central lowerback (exc), dysplastic- right abdomen (Exc)   Basal cell carcinoma 06/04/2016   back of neck   Bipolar I disorder (HCC)    Bleeding ulcer 2016   BPH (benign prostatic hypertrophy) with urinary obstruction    Cancer (HCC)    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 Maude Halt, oncology. Receives monthly IVIG 40 grams.   Depression    Epicondylitis    right elbow   GERD (gastroesophageal reflux disease)    Glaucoma BOTH EYES   RIGHT EYE RADIATION DAMAGE   Hearing loss    Bilateral   Hepatic cyst    Several, The lesion of concern in segment 6 of the liver has single large portal vein and hepatic vein branches extending to tt, in a pattern of enhancement which mirrors these vascular structures. 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 9 CM X  4. 3 CM PER NOV 06-14-17  CHEST CTFOLOWED BY DR PIETRO YEARLY FOR   Positional vertigo    HX OF WITH SINUS INFECTIONS   Prostate cancer (HCC)    Follows w/ Dr. Debby Polascik @ [facility].   Radial head fracture    Right   Squamous cell carcinoma of skin 06/04/2016   in situ-crown of scalp   Squamous cell carcinoma of skin 04/22/2017   in situ-crown scalp (txpbx)   Thoracic aortic aneurysm 06/14/2017   last CT 4.1 CM Mild   Tinnitus    CONSTANT   Ulnar nerve compression    right elbow   Unsteady gait    especially with stairs, depth perception off   Urinary hesitancy    Wears glasses     Past Surgical History:  Procedure Laterality Date   CARDIAC CATHETERIZATION  01-16-2006  DR DEBBY WALL   MILD CORONARY ATHEROSCLEROSIS/ MID TO DISTAL LAD 40% STENOSIS/ LVF 50-55%   CARPAL TUNNEL RELEASE Right 11/03/2017   Procedure: RIGHT HAND CARPAL TUNNEL RELEASE;  Surgeon: Shari Easter, MD;  Location: [facility];  Service: Orthopedics;  Laterality: Right;   CATARACT EXTRACTION Right    COLONSCOPY  2017 LAST DONE   MULTIPLE   CYST EXCISION Right 09/04/2023   Procedure: EXCISION CYST RIGHT SHOULDER;  Surgeon: Eletha Boas, MD;  Location: [facility];  Service: General;  Laterality: Right;  LOCAL & MAC   ENDOSCOPY  LAST 2017   MULTIPLE DONE DILATION DONE ALSO   ESOPHAGOGASTRODUODENOSCOPY (EGD) WITH PROPOFOL  N/A 02/24/2018   Procedure: ESOPHAGOGASTRODUODENOSCOPY (EGD) WITH PROPOFOL ;  Surgeon: Celestia Agent, MD;  Location: [facility];  Service: Endoscopy;  Laterality: N/A;   EXCISION RADIAL HEAD Right 11/03/2017   Procedure: RIGHT PROXIMAL RADIUS RADIAL HEAD RESECTION AND JOINT DEBRIDEMENT;  Surgeon: Shari Easter, MD;  Location: [facility];  Service: Orthopedics;  Laterality: Right;   EXTRACORPOREAL SHOCK WAVE LITHOTRIPSY Right 11/20/2020   Procedure: EXTRACORPOREAL SHOCK WAVE LITHOTRIPSY (ESWL);  Surgeon: Alvaro Hummer, MD;  Location: [facility];  Service: Urology;  Laterality: Right;  75 MINS   KNEE ARTHROSCOPY  05/01/2012   Procedure: ARTHROSCOPY KNEE;  Surgeon: Reyes JAYSON Billing, MD;  Location: [facility];  Service: Orthopedics;  Laterality: Left;  debridement and removal of loose body   LEFT ANKLE ARTHROSCOPY W/ DEBRIDEMENT  05/12/2007   LEFT HYDROCELECTOMY  03/29/2005   AND REPAIR LEFT INGUINAL HERNIA W/ MESH   MOHS SURGERY  2002   RIGHT ORBITAL CANCER   NASAL ENDOSCOPY  08/07/2005   RIGHT EPISTAXIS  / POST SEPTOPLASTY  (HX RIGHT ORBITAL CA & S/P RADIATION/ NECROSIS ANTERIOR END OF BOTH INFERIOR TURBINATES)   occuloplastic surgery  2002   PARS PLANA VITRECTOMY  11/06/2004   RIGHT EYE RADIATION RETINOPATHY W/ HEMORRHAGE   PROSTATE ABLATION N/A 06/2022   RADIAL HEAD ARTHROPLASTY Right 06/15/2018   Procedure: RIGHT ELBOW PROXIMAL RADIOULNAR JOINT DEBRIDEMENT AND ARTHROPLASTY;  Surgeon: Shari Easter, MD;  Location: [facility];  Service: Orthopedics;  Laterality: Right;  BLOCK WITH SEDATION   REPAIR UNDESENDED RIGHT TESTICLE / RIGHT INGUINAL HERNIA  AGE 73   RIGHT ANKLE ARTHROSCOPY W/ EXTENSIVE DEBRIDEMENT  04/05/2008   x2   RIGHT SHOULDER SURGERY  2006   RIGHT SUPRAOMOHYOID NECK DISSECTION   03/08/2003   ZONES 1,2,3;   SUBMANDIBULAR MASS / METASTATIC SQUAMOUS CELL CARCINOMA RIGHT NECK   SAVORY DILATION N/A 02/24/2018   Procedure: SAVORY DILATION;  Surgeon: Celestia,  Lynwood, MD;  Location: [facility];  Service: Endoscopy;  Laterality: N/A;   SEPTOPLASTY  06/2005   SHOULDER ARTHROSCOPY Left    SHOULDER ARTHROSCOPY W/ SUBACROMIAL DECOMPRESSION AND DISTAL CLAVICLE EXCISION  10/09/2008   AND DEBRIDEMENT OF RIGHT SHOULDER IMPINGEMENT & AC JOINT ARTHRITIS    SPINE SURGERY  2016   l 3 TO l 4 PLATE AND SCREWS   TOTAL THYROIDECTOMY  11/03/2001   PAPILLARY THYROID  CARCINOMA   TRANSTHORACIC ECHOCARDIOGRAM  07/2011   grade I diastolic dysfunction/ ef 55-60%   ULNAR NERVE TRANSPOSITION Right 04/28/2014   Procedure: RIGHT ELBOW ULNA NERVE RELEASE TRANSPOSTION AND MEDIAL EPICONDYLAR DEBRIDEMENT AND REPAIR;  Surgeon: Prentice LELON Pagan, MD;  Location: [facility];  Service: Orthopedics;  Laterality: Right;    Social History   Tobacco Use   Smoking status: Never   Smokeless tobacco: Never  Vaping Use   Vaping status: Never Used  Substance Use Topics   Alcohol  use: Not Currently   Drug use: No    Family History  Problem Relation Age of Onset   Kidney disease Mother    Depression Mother    Stroke Father    COPD Father    Hypertension Father    Prostate cancer Father    Depression Sister    Rectal cancer Sister    Lung cancer Brother        Small cell   Lung cancer Brother    Depression Daughter    Drug abuse Daughter    Psychiatric Illness Son     PE: Vitals:   06/01/24 1045 06/01/24 1402 06/01/24 1407 06/01/24 1600  BP: (!) 136/92  (!) 149/100 (!) 152/101  Pulse: 60  78 64  Resp: (!) 25  16 18   Temp:      TempSrc:  Rectal    SpO2: 99%  96% 95%  Weight:      Height:       General: No acute distress Respiratory: Normal work of breathing room air Cardiovascular: Regular rate per monitor GU: No Foley in place minimal left flank pain  Recent Labs    06/01/24 1024  WBC 11.8*  HGB 14.7  HCT 45.1   Recent Labs    06/01/24 1024  NA 137  K 4.9  CL 102  CO2 25  GLUCOSE 158*  BUN 19  CREATININE 1.22  CALCIUM  8.7*   No results for input(s): LABPT, INR in the last 72 hours. No results for input(s): LABURIN in the last 72 hours. Results for orders placed or performed in visit on 08/20/23  Urine Culture     Status: Abnormal   Collection Time: 08/20/23 11:04 AM   Specimen: Blood  Result Value Ref  Range Status   Source: NOT GIVEN  Final   Status: FINAL  Final   Isolate 1: Proteus mirabilis (A)  Final    Comment: Greater than 100,000 CFU/mL of Proteus mirabilis This organism may show imipenem resistance by mechanisms other than a carbapenemase.      Susceptibility   Proteus mirabilis - URINE CULTURE, REFLEX    AMOX/CLAVULANIC <=2 Sensitive     AMPICILLIN <=2 Sensitive     AMPICILLIN/SULBACTAM <=2 Sensitive     CEFAZOLIN * <=4 Not Reportable      * For infections other than uncomplicated UTI caused by E. coli, K. pneumoniae or P. mirabilis: Cefazolin  is resistant if MIC > or = 8 mcg/mL. (Distinguishing susceptible versus intermediate for isolates with MIC < or = 4 mcg/mL requires  additional testing.) For uncomplicated UTI caused by E. coli, K. pneumoniae or P. mirabilis: Cefazolin  is susceptible if MIC <32 mcg/mL and predicts susceptible to the oral agents cefaclor, cefdinir, cefpodoxime, cefprozil, cefuroxime, cephalexin  and loracarbef.     CEFTAZIDIME <=1 Sensitive     CEFEPIME <=1 Sensitive     CEFTRIAXONE  <=1 Sensitive     CIPROFLOXACIN  <=0.25 Sensitive     LEVOFLOXACIN  <=0.12 Sensitive     GENTAMICIN <=1 Sensitive     IMIPENEM 2 Intermediate     NITROFURANTOIN 128 Resistant     PIP/TAZO <=4 Sensitive     TOBRAMYCIN <=1 Sensitive     TRIMETH /SULFA * <=20 Sensitive      * For infections other than uncomplicated UTI caused by E. coli, K. pneumoniae or P. mirabilis: Cefazolin  is resistant if MIC > or = 8 mcg/mL. (Distinguishing susceptible versus intermediate for isolates with MIC < or = 4 mcg/mL requires additional testing.) For uncomplicated UTI caused by E. coli, K. pneumoniae or P. mirabilis: Cefazolin  is susceptible if MIC <32 mcg/mL and predicts susceptible to the oral agents cefaclor, cefdinir, cefpodoxime, cefprozil, cefuroxime, cephalexin  and loracarbef. Legend: S = Susceptible  I = Intermediate R = Resistant  NS = Not susceptible SDD = Susceptible Dose  Dependent * = Not Tested  NR = Not Reported **NN = See Therapy Comments    *Note: Due to a large number of results and/or encounters for the requested time period, some results have not been displayed. A complete set of results can be found in Results Review.    Imaging: 1. Moderate left-sided hydronephrosis and hydroureter due to two proximal left ureteral calculi, largest 5 mm. 2. Additional nonobstructing bilateral renal collecting system calculi up to 3 mm. 3. Incidental findings, including: Hepatic steatosis. aortic atherosclerosis ([id number]) mild prostatomegaly.   Imp: 68 year old male with a history of prostate cancer urethral stricture now with a left mid to lower ureter ureteral stone.  Stone is seen on CT and KUB the stone is marked on KUB we discussed options of trial of passage ESWL and ureteroscopy.  Since patient failed today he would prefer to get rid of the stone via ESWL which was the risk benefits of alternatives of ESWL including bleeding infection demonstrating structures possible Steinstrasse's possible need for repeat ureteroscopy in the future.  Patient voices understanding and consent was obtained.  Patient  Recommendations: Ucx today  Will set up for URS - give trial of passage meds with return precautions    Thank you for involving me in this patient's care, I will continue to follow along.Please page with any further questions or concerns. Steffan JAYSON Pea

## 2024-06-01 NOTE — ED Provider Triage Note (Signed)
 Emergency Medicine Provider Triage Evaluation Note  Alex [name] , a 68 y.o. male  was evaluated in triage.  Pt complains of L sided flank pain that started this AM radiating towards groin, similar to previous kidney stones in the past, noting that he was eating breakfast this AM when he had a unwitnessed syncopal episode, woke up on the ground with cereal in his mouth. Hit head. Woke up and called EMS and crawled to open door.   Endorses nausea, lightheadedness. Dysuria x 1 week. Reported to have passed kidney stone on own on 10/8.   Denies headache, vision changes, vertigo, hearing loss, unilateral weakness, chest pain, shortness of breath, hematuria.   Review of Systems  Positive: N/a Negative: N/a  Physical Exam  BP (!) 153/93 (BP Location: Right Arm)   Pulse (!) 59   Temp 98.2 F (36.8 C) (Oral)   Resp 18   Ht 6' (1.829 m)   Wt 83 kg   SpO2 93%   BMI 24.82 kg/m  Gen:   Awake, no distress   Resp:  Normal effort  MSK:   Moves extremities without difficulty  Other:    Medical Decision Making  Medically screening exam initiated at 10:44 AM.  Appropriate orders placed.  Jahid Weida Merfeld was informed that the remainder of the evaluation will be completed by another provider, this initial triage assessment does not replace that evaluation, and the importance of remaining in the ED until their evaluation is complete.     Beola Terrall RAMAN, [place] 06/01/24 1050

## 2024-06-01 NOTE — ED Triage Notes (Addendum)
 Patient BIB [facility] from home. Was eating breakfast and next thing he knows is that he woke up on the floor. Hematoma on right side of his head. No blood thinners. Patient feels dizzy. This morning woke up with left lower flank pain that wraps around to his groin. [place] this feels like a kidney stone. Vomited after hitting his head.   EMS 20G left forearm 4mg  zofran  50mcg fentanyl 

## 2024-06-01 NOTE — ED Notes (Signed)
 Pt did wish for me to contact provider to see if he needed an antibiotic prior to leaving, I did this and provider did not feel as if he needed it. I did advise him to follow up with urology per discharge paperwork. He was also informed that his urologist did not think he needed antibiotic but his urine was sent off for culture, he was advised the same. Pt discharged, left POV with wife.

## 2024-06-01 NOTE — Discharge Instructions (Signed)
 Your test today suggest your fainting spell was related to the pain associated with your kidney stone.  Take the medications as prescribed to help with the pain and nausea.  Follow-up with Dr. Shane as instructed.  Return to the ED for fevers chills or other concerning symptoms

## 2024-06-02 ENCOUNTER — Other Ambulatory Visit: Payer: Self-pay | Admitting: Urology

## 2024-06-02 ENCOUNTER — Ambulatory Visit (INDEPENDENT_AMBULATORY_CARE_PROVIDER_SITE_OTHER): Admitting: Licensed Clinical Social Worker

## 2024-06-02 ENCOUNTER — Encounter (HOSPITAL_COMMUNITY): Payer: Self-pay | Admitting: Urology

## 2024-06-02 ENCOUNTER — Ambulatory Visit (HOSPITAL_COMMUNITY): Admitting: Licensed Clinical Social Worker

## 2024-06-02 DIAGNOSIS — F319 Bipolar disorder, unspecified: Secondary | ICD-10-CM | POA: Diagnosis not present

## 2024-06-02 LAB — URINE CULTURE: Culture: <10000 — AB

## 2024-06-02 NOTE — Progress Notes (Addendum)
 LITHO PREOP PHONE CALL   ALLERGIES REVIEWED: YES  MEDICATION REVIEW DONE: YES MEDICATIONS THAT PT SHOULD HOLD (LIST): ASA 72hr hold, Toradol  24hr Mobic  48hr hold  CAN PT WALK UP STAIRS WITHOUT SHORTNESS OF BREATH: YES HOME O2: NO CPAP: NO  IF YES, INFORMED PT TO BRING CPAP WITH TUBING AND MASK:YES/NO   INFORMED DRIVER NEEDED FOR PROCEDURE: YES   PT WAS GIVEN BLUE FOLDER AT UROLOGY APPT: YES PT INFORMED TO BRING BLUE FOLDER WITH ALL CONTENTS: YES  REVIEWED ARRIVAL TIME AND LOCATION: YES  OTHER PERTINENT INFORMATION: ED 10/21 Fall at home abrasion on head, Urologist saw in ED, fell due to stone possible vagal spasm

## 2024-06-04 ENCOUNTER — Telehealth (HOSPITAL_BASED_OUTPATIENT_CLINIC_OR_DEPARTMENT_OTHER): Payer: Self-pay

## 2024-06-04 NOTE — Telephone Encounter (Signed)
   Patient Name: Alex [name]  DOB: 21-Jan-1956 MRN: [id number]  Primary Cardiologist: Redell Shallow, MD Preop Clearance: -Patients RCIR score is 0.4% -[name] was able to achieve greater than 4 METS without cardiac limitations. He was also free of any acute cardiac complaints. Therefore, based on ACC/AHA guidelines, the patient would be at acceptable risk for the planned procedure without further cardiovascular testing. The patient was advised that if he develops new symptoms prior to surgery to contact our office to arrange for a follow-up visit, and he verbalized understanding.    Regarding ASA therapy,  it may be stopped 5-7 days prior to surgery with a plan to resume it as soon as felt to be feasible from a surgical standpoint in the post-operative period.  The patient was advised that if he develops new symptoms prior to surgery to contact our office to arrange for a follow-up visit, and he verbalized understanding.  I will route this recommendation to the requesting party via Epic fax function and remove from pre-op pool.  Please call with questions.  Lamarr Satterfield, NP 06/04/2024, 1:33 PM

## 2024-06-04 NOTE — Telephone Encounter (Signed)
   Pre-operative Risk Assessment    Patient Name: Alex [name]  DOB: 05/27/1956 MRN: [id number]   Date of last office visit: 04/14/2024 with Jackee Alberts, NP Date of next office visit: N/A   Request for Surgical Clearance    Procedure:  Rt Reverse Shoulder Arthroplasty   Date of Surgery:  Clearance 07/20/24                                 Surgeon:  Dr. Franky Pointer Surgeon's Group or Practice Name:  [facility] Phone number:  [phone] Fax number:  [phone] or [phone]- Attention: Burnard Dross   Type of Clearance Requested:   - Medical  - Pharmacy:  Hold Aspirin  - Need instructions for ASA   Type of Anesthesia:  General    Additional requests/questions:  Please fax back your instructions to [facility] [phone].   SignedPatrcia Hong L   06/04/2024, 11:57 AM

## 2024-06-04 NOTE — Telephone Encounter (Signed)
 Jackee,  You saw this patient on 04/14/2024. Per protocol we request that you comment on his cardiac risk to proceed with Rt Reverse Shoulder Arthroplasty on TBD, since it has been less than 2 months since evaluated in the office. Please send your comment to P CV Pre-Op Pool.  Thank you, Lamarr Satterfield DNP, ANP, AACC.

## 2024-06-05 ENCOUNTER — Other Ambulatory Visit: Payer: Self-pay | Admitting: Cardiology

## 2024-06-06 ENCOUNTER — Encounter (HOSPITAL_COMMUNITY): Payer: Self-pay | Admitting: Licensed Clinical Social Worker

## 2024-06-06 NOTE — Progress Notes (Signed)
 Virtual virtual Video Note  I connected with Alex [name] on 06/02/2024 at  2-3pm EST by video-enabled virtual visit. I verified that I am speaking with the correct person using  two identifiers.I discussed the limitations of evaluation and management by telemedicine and the availability of in person appointments. The patient expressed understanding and agreed to proceed.    LOCATION: Patient: Home  Provider: Home office   History of Present Illness:  Pt was referred by Dr. Arfeen for OP therapy for bipolar disorder and anxiety.  Treatment Goal Addressed:  Pt will meet with clinician weekly for therapy to monitor for progress towards goals and address any barriers to success; Reduce depression from average severity level of 6/10 down to a 4/10 in next 6 months by engaging in 1-2 positive coping skills daily as part of developing self-care routine; Reduce average anxiety level from 7/10 down to 5/10 in next 6 months by utilizing 1-2 relaxation skills/grounding skills per day, such as mindful breathing, progressive muscle relaxation, positive visualizations.  Progress towards Treatment Goal: Progressing   Observations/Objective: Patient presented for today's session on time and was alert, oriented x5, with no evidence or self-report of SI/HI or A/V H.  Patient reported ongoing compliance with medication and denied any use of alcohol  or illicit substances. Clinician inquired about patient's current emotional ratings, as well as any significant changes in thoughts, feelings or behavior since previous session. Patient reported scores of  7/10 for depression, 7/10 for anxiety, 3/10 for anger/irritability. [name] and pt explored his emotional ratings, and discussed coping skills for his stressors. Pt identifies stressors and  allowed pt to explore and express thoughts and feelings associated with recent life situations and external stressors. [name] and pt reviewed his continued stressors individually:  marital issues, unfair division of martial finances, son's mental health, physical health, future, pt's sexual health after prostate cancer, lack of marital communication.  I passed out on the kitchen floor 2 days ago, hit my head and called EMS. I was in so much pain from kidney stones.[name] asked open ended questions.  They are going to blast the kidney stones and all the abrasions on my face and head are healing up. Pt reports on continued stressors and cln provided appropriate coping skills for pt to use.  Clinician utilized CBT to process concerns  worries and challenges. Clinician helped pt processing thoughts, feelings, and behaviors.  Assessment and plan: Counselor will continue to meet with patient to address treatment plan goals. Patient will continue to follow recommendations of providers and imnd implement skills learned in session, and practice between sessions. Diagnosis: Bipolar 1 disorder.    Collaboration of care:   Continue working with providers    Patient/Guardian was advised Release of Information must be obtained prior to any record release in order to collaborate their care with an outside provider. Patient/Guardian was advised if they have not already done so to contact the registration department to sign all necessary forms in order for us  to release information regarding their care.    Consent: Patient/Guardian gives verbal consent for treatment and assignment of benefits for services provided during this visit. Patient/Guardian expressed understanding and agreed to proceed.     Follow Up Instructions:  I discussed the assessment and treatment plan with the patient. The patient was provided an opportunity to ask questions and all were answered. The patient agreed with the plan and demonstrated an understanding of the instructions.   The patient was advised to call back or seek  an in-person evaluation if the symptoms worsen or if the condition fails to improve as  anticipated.  I provided 60 minutes of non-face-to-face time during this encounter.   Shayan Bramhall S, LCAS 06/02/24

## 2024-06-07 ENCOUNTER — Ambulatory Visit (HOSPITAL_COMMUNITY)

## 2024-06-07 ENCOUNTER — Encounter (HOSPITAL_COMMUNITY)
Admission: RE | Disposition: A | Discharge status: Home / Self Care | Payer: Self-pay | Source: Home / Self Care | Attending: Urology

## 2024-06-07 ENCOUNTER — Ambulatory Visit (HOSPITAL_COMMUNITY)
Admission: RE | Admit: 2024-06-07 | Discharge: 2024-06-07 | Disposition: A | Discharge status: Home / Self Care | Attending: Urology | Admitting: Urology

## 2024-06-07 DIAGNOSIS — Z981 Arthrodesis status: Secondary | ICD-10-CM | POA: Diagnosis not present

## 2024-06-07 DIAGNOSIS — N200 Calculus of kidney: Secondary | ICD-10-CM | POA: Insufficient documentation

## 2024-06-07 DIAGNOSIS — Z539 Procedure and treatment not carried out, unspecified reason: Secondary | ICD-10-CM | POA: Insufficient documentation

## 2024-06-07 DIAGNOSIS — N201 Calculus of ureter: Secondary | ICD-10-CM | POA: Diagnosis not present

## 2024-06-07 SURGERY — LITHOTRIPSY, ESWL
Anesthesia: LOCAL | Laterality: Left

## 2024-06-07 MED ORDER — DIAZEPAM 5 MG PO TABS: 10.0000 mg | ORAL_TABLET | ORAL | Status: DC

## 2024-06-07 MED ORDER — CIPROFLOXACIN HCL 500 MG PO TABS: 500.0000 mg | ORAL_TABLET | ORAL | Status: DC

## 2024-06-07 MED ORDER — DIPHENHYDRAMINE HCL 25 MG PO CAPS: 25.0000 mg | ORAL_CAPSULE | ORAL | Status: DC

## 2024-06-07 MED ORDER — SODIUM CHLORIDE 0.9 % IV SOLN: INTRAVENOUS | Status: DC

## 2024-06-07 NOTE — Progress Notes (Signed)
 Per Dr Renda patient is cancelled today due to passed stone.  Patient verbalized understanding and discharged home with wife.

## 2024-06-07 NOTE — Progress Notes (Signed)
 Patient states that he passed stone and has stone with him. He denies any pain and is having KUB. Dr. Renda will be by to speak with him.

## 2024-06-08 ENCOUNTER — Ambulatory Visit (HOSPITAL_COMMUNITY): Admitting: Licensed Clinical Social Worker

## 2024-06-08 DIAGNOSIS — Z87442 Personal history of urinary calculi: Secondary | ICD-10-CM | POA: Diagnosis not present

## 2024-06-09 ENCOUNTER — Ambulatory Visit (HOSPITAL_COMMUNITY): Admitting: Licensed Clinical Social Worker

## 2024-06-09 ENCOUNTER — Ambulatory Visit (INDEPENDENT_AMBULATORY_CARE_PROVIDER_SITE_OTHER): Admitting: Licensed Clinical Social Worker

## 2024-06-09 ENCOUNTER — Encounter (HOSPITAL_COMMUNITY): Payer: Self-pay | Admitting: Licensed Clinical Social Worker

## 2024-06-09 DIAGNOSIS — F411 Generalized anxiety disorder: Secondary | ICD-10-CM | POA: Diagnosis not present

## 2024-06-09 DIAGNOSIS — Z981 Arthrodesis status: Secondary | ICD-10-CM | POA: Diagnosis not present

## 2024-06-09 DIAGNOSIS — M75101 Unspecified rotator cuff tear or rupture of right shoulder, not specified as traumatic: Secondary | ICD-10-CM | POA: Diagnosis not present

## 2024-06-09 DIAGNOSIS — F319 Bipolar disorder, unspecified: Secondary | ICD-10-CM | POA: Diagnosis not present

## 2024-06-09 DIAGNOSIS — M791 Myalgia, unspecified site: Secondary | ICD-10-CM | POA: Diagnosis not present

## 2024-06-09 DIAGNOSIS — M5116 Intervertebral disc disorders with radiculopathy, lumbar region: Secondary | ICD-10-CM | POA: Diagnosis not present

## 2024-06-09 NOTE — Progress Notes (Signed)
 Virtual virtual Video Note  I connected with Alex [name] on 06/09/2024 at  2-3pm EST by video-enabled virtual visit. I verified that I am speaking with the correct person using  two identifiers.I discussed the limitations of evaluation and management by telemedicine and the availability of in person appointments. The patient expressed understanding and agreed to proceed.    LOCATION: Patient: Home  Provider: Home office   History of Present Illness:  Pt was referred by Dr. Arfeen for OP therapy for bipolar disorder and anxiety.  Treatment Goal Addressed:  Pt will meet with clinician weekly for therapy to monitor for progress towards goals and address any barriers to success; Reduce depression from average severity level of 6/10 down to a 4/10 in next 6 months by engaging in 1-2 positive coping skills daily as part of developing self-care routine; Reduce average anxiety level from 7/10 down to 5/10 in next 6 months by utilizing 1-2 relaxation skills/grounding skills per day, such as mindful breathing, progressive muscle relaxation, positive visualizations.  Progress towards Treatment Goal: Progressing   Observations/Objective: Patient presented for today's session on time and was alert, oriented x5, with no evidence or self-report of SI/HI or A/V H.  Patient reported ongoing compliance with medication and denied any use of alcohol  or illicit substances. Clinician inquired about patient's current emotional ratings, as well as any significant changes in thoughts, feelings or behavior since previous session. Patient reported scores of  7/10 for depression, 7/10 for anxiety, 3/10 for anger/irritability. [name] and pt explored his emotional ratings, and discussed coping skills for his stressors. Pt identifies stressors and  allowed pt to explore and express thoughts and feelings associated with recent life situations and external stressors. [name] and pt reviewed his continued stressors individually:  marital issues, unfair division of martial finances, son's mental health, physical health, future, pt's sexual health after prostate cancer, lack of marital communication.  I'm healing from passing out on the kitchen floor , hiting my head and going to the hospital by EMS, my abrasions are healing up. I passed my kidney stones.[name] asked open ended questions.  [name] assessed his family dynamics, since a lot of his stressors surround his family. [name] provided psychoeducation on dynamics of healthy vs unhealthy relationships associated with power and control. [name] validated and normalized patient's feelings while redirecting his focus to things that he can control.        Assessment and plan: Counselor will continue to meet with patient to address treatment plan goals. Patient will continue to follow recommendations of providers and imnd implement skills learned in session, and practice between sessions. Diagnosis: Bipolar 1 disorder.    Collaboration of care:   Continue working with providers    Patient/Guardian was advised Release of Information must be obtained prior to any record release in order to collaborate their care with an outside provider. Patient/Guardian was advised if they have not already done so to contact the registration department to sign all necessary forms in order for us  to release information regarding their care.    Consent: Patient/Guardian gives verbal consent for treatment and assignment of benefits for services provided during this visit. Patient/Guardian expressed understanding and agreed to proceed.     Follow Up Instructions:  I discussed the assessment and treatment plan with the patient. The patient was provided an opportunity to ask questions and all were answered. The patient agreed with the plan and demonstrated an understanding of the instructions.   The patient was advised to call back  or seek an in-person evaluation if the symptoms worsen or if the  condition fails to improve as anticipated.  I provided 60 minutes of non-face-to-face time during this encounter.   Aevah Stansbery S, LCAS 06/09/24

## 2024-06-10 ENCOUNTER — Telehealth: Payer: Self-pay | Admitting: Internal Medicine

## 2024-06-10 NOTE — Telephone Encounter (Signed)
 Paperwork was placed on PCP.

## 2024-06-10 NOTE — Telephone Encounter (Signed)
 [facility] faxed document Surgical Clearance, to be filled out by provider. Patient requested to send it back via Fax within 7-days. Document is located in providers tray at front office.Please advise at Mobile [phone] (mobile)

## 2024-06-14 DIAGNOSIS — H04121 Dry eye syndrome of right lacrimal gland: Secondary | ICD-10-CM | POA: Diagnosis not present

## 2024-06-14 DIAGNOSIS — H35351 Cystoid macular degeneration, right eye: Secondary | ICD-10-CM | POA: Diagnosis not present

## 2024-06-14 DIAGNOSIS — E119 Type 2 diabetes mellitus without complications: Secondary | ICD-10-CM | POA: Diagnosis not present

## 2024-06-14 DIAGNOSIS — H3589 Other specified retinal disorders: Secondary | ICD-10-CM | POA: Diagnosis not present

## 2024-06-14 DIAGNOSIS — H353121 Nonexudative age-related macular degeneration, left eye, early dry stage: Secondary | ICD-10-CM | POA: Diagnosis not present

## 2024-06-14 DIAGNOSIS — T66XXXS Radiation sickness, unspecified, sequela: Secondary | ICD-10-CM | POA: Diagnosis not present

## 2024-06-16 ENCOUNTER — Ambulatory Visit (HOSPITAL_COMMUNITY): Admitting: Licensed Clinical Social Worker

## 2024-06-22 ENCOUNTER — Ambulatory Visit: Payer: Self-pay

## 2024-06-22 NOTE — Telephone Encounter (Signed)
 FYI Only or Action Required?: FYI only for provider: appointment scheduled on 06/23/2024 at 8:50 AM.  Patient was last seen in primary care on 04/01/2024 by Plotnikov, Karlynn GAILS, MD.  Called Nurse Triage reporting Urinary Frequency.  Symptoms began several weeks ago.  Interventions attempted: Rest, hydration, or home remedies.  Symptoms are: unchanged.  Triage Disposition: See Physician Within 24 Hours  Patient/caregiver understands and will follow disposition?: Yes  Copied from CRM [id number]. Topic: Clinical - Red Word Triage >> Jun 22, 2024  8:37 AM Mesmerise C wrote: [facility] that prompted transfer to Nurse Triage: Patient states he believes he may have a UTI, he's been frequently urinating, burning when urinating stated a couple weeks ago he was in the ER for kidney stones and passed 2 stones, currently in [place] but will be in this evening so needs an appt this week Reason for Disposition  Urinating more frequently than usual (i.e., frequency) OR new-onset of the feeling of an urgent need to urinate (i.e., urgency)  Answer Assessment - Initial Assessment Questions Patient reports a couple of weeks of urinary frequency and burning with urination. Patient reports he was dx with kidney stones a couple of weeks ago-he has passed two stones. Patient is currently in [place]-patient missed his urology appointment due canceled flights. Patient is asking to see PCP this week. Scheduled patient for an acute visit with PCP tomorrow at 8:50 AM  1. SYMPTOM: What's the main symptom you're concerned about? (e.g., frequency, incontinence)     Urinary frequency 2. ONSET: When did the  urinary frequency  start?     Started a couple of weeks ago 3. PAIN: Is there any pain? If Yes, ask: How bad is it? (Scale: 1-10; mild, moderate, severe)     3 4. CAUSE: What do you think is causing the symptoms?     Possible UTI 5. OTHER SYMPTOMS: Do you have any other symptoms? (e.g., blood in urine,  fever, flank pain, pain with urination)     Burning with urination  Protocols used: Urinary Symptoms-A-AH

## 2024-06-23 ENCOUNTER — Ambulatory Visit (INDEPENDENT_AMBULATORY_CARE_PROVIDER_SITE_OTHER): Admitting: Licensed Clinical Social Worker

## 2024-06-23 ENCOUNTER — Encounter (HOSPITAL_COMMUNITY): Payer: Self-pay | Admitting: Licensed Clinical Social Worker

## 2024-06-23 ENCOUNTER — Encounter: Payer: Self-pay | Admitting: Internal Medicine

## 2024-06-23 ENCOUNTER — Ambulatory Visit: Payer: Self-pay | Admitting: Internal Medicine

## 2024-06-23 ENCOUNTER — Ambulatory Visit: Admitting: Internal Medicine

## 2024-06-23 VITALS — BP 144/90 | HR 72 | Ht 72.0 in | Wt 187.8 lb

## 2024-06-23 DIAGNOSIS — R300 Dysuria: Secondary | ICD-10-CM

## 2024-06-23 DIAGNOSIS — M19011 Primary osteoarthritis, right shoulder: Secondary | ICD-10-CM

## 2024-06-23 DIAGNOSIS — F319 Bipolar disorder, unspecified: Secondary | ICD-10-CM | POA: Diagnosis not present

## 2024-06-23 DIAGNOSIS — D508 Other iron deficiency anemias: Secondary | ICD-10-CM | POA: Diagnosis not present

## 2024-06-23 DIAGNOSIS — M19019 Primary osteoarthritis, unspecified shoulder: Secondary | ICD-10-CM | POA: Insufficient documentation

## 2024-06-23 DIAGNOSIS — N320 Bladder-neck obstruction: Secondary | ICD-10-CM

## 2024-06-23 LAB — URINALYSIS
Bilirubin Urine: NEGATIVE
Hgb urine dipstick: NEGATIVE
Leukocytes,Ua: NEGATIVE
Nitrite: NEGATIVE
Specific Gravity, Urine: 1.025 (ref 1.000–1.030)
Total Protein, Urine: NEGATIVE
Urine Glucose: NEGATIVE
Urobilinogen, UA: 0.2 (ref 0.0–1.0)
pH: 5 (ref 5.0–8.0)

## 2024-06-23 MED ORDER — SULFAMETHOXAZOLE-TRIMETHOPRIM 800-160 MG PO TABS: 1.0000 | ORAL_TABLET | Freq: Two times a day (BID) | ORAL | 0 refills | Status: DC

## 2024-06-23 MED ORDER — TADALAFIL 5 MG PO TABS: 5.0000 mg | ORAL_TABLET | Freq: Every day | ORAL | 3 refills | Status: DC

## 2024-06-23 NOTE — Assessment & Plan Note (Signed)
 On Flomax , Cialis 

## 2024-06-23 NOTE — Progress Notes (Signed)
 Subjective:  Patient ID: Alex [name], male    DOB: 08-16-1955  Age: 68 y.o. MRN: [id number]  CC: Urinary Frequency (New increased urinary frequency, burning when urinating. Recent ER visit for several kidney stones. Believed to have since been passed. Wants to make sure there isn't a UTI)   HPI  [facility] presents for increased urinary frequency, burning when urinating. Recent ER visit for several kidney stones 2 wks ago, passed out w/L flank pain. Believed to have since been passed after lithotripsy. Wants to make sure there isn't a UTI...  Needs R shoulder reverse arthroplasty on 07/01/24 - pain  Outpatient Medications Prior to Visit  Medication Sig Dispense Refill   acetaminophen  (TYLENOL ) 500 MG tablet Take by mouth.     aspirin  EC 81 MG tablet Take 1 tablet by mouth daily.     atorvastatin  (LIPITOR) 40 MG tablet TAKE 1 TABLET BY MOUTH EVERY DAY (Patient taking differently: Take 40 mg by mouth at bedtime.) 90 tablet 3   Coenzyme Q10 (CO Q 10 PO) Take 1 capsule by mouth daily.      erythromycin ophthalmic ointment Place 1 Application into the right eye daily as needed (conjunctivitis).     ketorolac  (TORADOL ) 10 MG tablet Take 1 tablet (10 mg total) by mouth every 6 (six) hours as needed. (Patient not taking: Reported on 06/24/2024) 20 tablet 0   LamoTRIgine  300 MG TB24 24 hour tablet Take 1 tablet (300 mg total) by mouth every morning. 90 tablet 0   latanoprost  (XALATAN ) 0.005 % ophthalmic solution Place 1 drop into both eyes at bedtime.     levothyroxine  (SYNTHROID ) 150 MCG tablet TAKE 1 TABLET BY MOUTH DAILY BEFORE BREAKFAST. (Patient taking differently: Take 150 mcg by mouth at bedtime.) 90 tablet 2   meloxicam  (MOBIC ) 15 MG tablet TAKE 1 TABLET BY MOUTH EVERY DAY AS NEEDED 90 tablet 0   Multiple Vitamin (MULTI-VITAMINS) TABS Take 1 tablet by mouth daily.     nitroGLYCERIN  (NITROLINGUAL ) 0.4 MG/SPRAY spray Place 1 spray under the tongue every 5 (five) minutes x 3 doses  as needed for chest pain. Esophageal spasms 12 g 12   OMEGA-3 KRILL OIL PO Take 1 capsule by mouth daily.      ondansetron  (ZOFRAN -ODT) 8 MG disintegrating tablet Take 1 tablet (8 mg total) by mouth every 8 (eight) hours as needed for nausea or vomiting. 12 tablet 0   oxyCODONE  (ROXICODONE ) 5 MG immediate release tablet Take 1 tablet (5 mg total) by mouth every 6 (six) hours as needed for severe pain (pain score 7-10). 16 tablet 0   pantoprazole  (PROTONIX ) 40 MG tablet Take 1 tablet (40 mg total) by mouth daily. 90 tablet 11   propranolol  (INDERAL ) 40 MG tablet Take 1 tablet (40 mg total) by mouth 2 (two) times daily. (Patient taking differently: Take 40 mg by mouth at bedtime.) 180 tablet 3   sildenafil  (REVATIO ) 20 MG tablet Take 1 tablet (20 mg total) by mouth as needed. 30 tablet 1   tamsulosin  (FLOMAX ) 0.4 MG CAPS capsule Take 1 capsule (0.4 mg total) by mouth daily. 90 capsule 1   telmisartan  (MICARDIS ) 80 MG tablet TAKE 1 TABLET BY MOUTH EVERY DAY 90 tablet 3   timolol  (TIMOPTIC ) 0.5 % ophthalmic solution timolol  maleate 0.5 % eye drops  INSTILL 1 DROP INTO RIGHT EYE TWICE A DAY     traMADol  (ULTRAM ) 50 MG tablet Take 1 tablet (50 mg total) by mouth every 6 (six) hours as  needed for moderate pain (pain score 4-6). (Patient not taking: Reported on 06/24/2024) 12 tablet 0   AXIRON  30 MG/ACT SOLN Place 2 Act onto the skin daily. (Patient not taking: Reported on 06/24/2024)     brimonidine (ALPHAGAN) 0.2 % ophthalmic solution SMARTSIG:In Eye(s) (Patient not taking: Reported on 06/24/2024)     Immune Globulin, Human, 4 GM/20ML SOLN Inject into the skin once a week. wednesday (Patient not taking: Reported on 06/24/2024)     tadalafil  (CIALIS ) 5 MG tablet Take 5 mg by mouth.     EPINEPHrine  0.3 mg/0.3 mL IJ SOAJ injection Inject 0.3 mg into the muscle as needed.     furosemide  (LASIX ) 20 MG tablet TAKE 1 TO 2 TABLETS BY MOUTH DAILY AS NEEDED 180 tablet 1   zolpidem  (AMBIEN ) 10 MG tablet TAKE 1/2  (ONE-HALF) TABLET BY MOUTH AT BEDTIME AND  TAKE  THE  OTHER  HALF  IF  NEEDED (Patient not taking: Reported on 04/14/2024) 30 tablet 0   No facility-administered medications prior to visit.    ROS: Review of Systems  Constitutional:  Negative for appetite change, fatigue and unexpected weight change.  HENT:  Negative for congestion, nosebleeds, sneezing, sore throat and trouble swallowing.   Eyes:  Negative for itching and visual disturbance.  Respiratory:  Negative for cough.   Cardiovascular:  Negative for chest pain, palpitations and leg swelling.  Gastrointestinal:  Negative for abdominal distention, blood in stool, diarrhea and nausea.  Genitourinary:  Positive for dysuria, frequency and urgency. Negative for hematuria.  Musculoskeletal:  Negative for back pain, gait problem, joint swelling and neck pain.  Skin:  Negative for rash.  Neurological:  Negative for dizziness, tremors, speech difficulty and weakness.  Psychiatric/Behavioral:  Negative for agitation, dysphoric mood, sleep disturbance and suicidal ideas. The patient is not nervous/anxious.     Objective:  BP (!) 144/90   Pulse 72   Ht 6' (1.829 m)   Wt 187 lb 12.8 oz (85.2 kg)   SpO2 96%   BMI 25.47 kg/m   BP Readings from Last 3 Encounters:  06/25/24 (!) 144/91  06/23/24 (!) 144/90  06/01/24 (!) 125/91    Wt Readings from Last 3 Encounters:  06/25/24 184 lb (83.5 kg)  06/23/24 187 lb 12.8 oz (85.2 kg)  06/01/24 182 lb 15.7 oz (83 kg)    Physical Exam Constitutional:      General: He is not in acute distress.    Appearance: He is well-developed.     Comments: NAD  Eyes:     Conjunctiva/sclera: Conjunctivae normal.     Pupils: Pupils are equal, round, and reactive to light.  Neck:     Thyroid : No thyromegaly.     Vascular: No JVD.  Cardiovascular:     Rate and Rhythm: Normal rate and regular rhythm.     Heart sounds: Normal heart sounds. No murmur heard.    No friction rub. No gallop.  Pulmonary:      Effort: Pulmonary effort is normal. No respiratory distress.     Breath sounds: Normal breath sounds. No wheezing or rales.  Chest:     Chest wall: No tenderness.  Abdominal:     General: Bowel sounds are normal. There is no distension.     Palpations: Abdomen is soft. There is no mass.     Tenderness: There is no abdominal tenderness. There is no guarding or rebound.  Musculoskeletal:        General: Tenderness present. Normal range of motion.  Cervical back: Normal range of motion.     Right lower leg: No edema.     Left lower leg: No edema.  Lymphadenopathy:     Cervical: No cervical adenopathy.  Skin:    General: Skin is warm and dry.     Findings: No rash.  Neurological:     Mental Status: He is alert and oriented to person, place, and time.     Cranial Nerves: No cranial nerve deficit.     Motor: No abnormal muscle tone.     Coordination: Coordination normal.     Gait: Gait normal.     Deep Tendon Reflexes: Reflexes are normal and symmetric.  Psychiatric:        Behavior: Behavior normal.        Thought Content: Thought content normal.        Judgment: Judgment normal.   R shoulder pain   Lab Results  Component Value Date   WBC 11.8 (H) 06/01/2024   HGB 14.7 06/01/2024   HCT 45.1 06/01/2024   PLT 219 06/01/2024   GLUCOSE 158 (H) 06/01/2024   CHOL 186 08/26/2023   TRIG 263.0 (H) 08/26/2023   HDL 60.60 08/26/2023   LDLDIRECT 59.0 02/02/2016   LDLCALC 72 08/26/2023   ALT 25 06/01/2024   AST 32 06/01/2024   NA 137 06/01/2024   K 4.9 06/01/2024   CL 102 06/01/2024   CREATININE 1.22 06/01/2024   BUN 19 06/01/2024   CO2 25 06/01/2024   TSH 7.92 (H) 03/11/2024   PSA 3.52 08/26/2023   INR 1.0 09/13/2019   HGBA1C 6.1 01/02/2024    DG Abd 1 View Result Date: 06/07/2024 CLINICAL DATA:  Ureteral stone. EXAM: ABDOMEN - 1 VIEW COMPARISON:  Radiograph and CT 06/01/2024 FINDINGS: Previous left ureteral calculus is not confidently seen on the current exam. There  are multiple left intrarenal calculi. Calcifications in the pelvis correspond to phleboliths on prior CT. Normal bowel gas pattern. Lumbar scoliosis post posterior fusion. IMPRESSION: 1. Previous left ureteral calculus is not confidently seen on the current exam. 2. Multiple left intrarenal calculi. Electronically Signed   By: Andrea Gasman [name]   On: 06/07/2024 14:35    Assessment & Plan:   Problem List Items Addressed This Visit     Bladder neck obstruction   On Flomax , Cialis       Dysuria - Primary   New ncreased urinary frequency, burning when urinating. Recent ER visit for several kidney stones 2 wks ago, passed out w/L flank pain. Believed to have since been passed after lithotripsy. R/o UTI... Check UA Abx if needed (Septra  DS)      Relevant Orders   Urinalysis (Completed)   Iron  deficiency anemia   Check CBC      Shoulder arthritis   R shoulder reverse arthroplasty on 07/01/24 dy Dr Melita - pending He will need a Foley post-op due to h/o urinary retention          Meds ordered this encounter  Medications   sulfamethoxazole -trimethoprim  (BACTRIM  DS) 800-160 MG tablet    Sig: Take 1 tablet by mouth 2 (two) times daily.    Dispense:  20 tablet    Refill:  0   tadalafil  (CIALIS ) 5 MG tablet    Sig: Take 1 tablet (5 mg total) by mouth daily.    Dispense:  90 tablet    Refill:  3      Follow-up: Return in about 3 months (around 09/23/2024) for a follow-up visit.  Marolyn Noel, MD

## 2024-06-23 NOTE — Progress Notes (Signed)
 Virtual virtual Video Note  I connected with Alex [name] on 06/23/2024 at  2-3pm EST by video-enabled virtual visit. I verified that I am speaking with the correct person using  two identifiers.I discussed the limitations of evaluation and management by telemedicine and the availability of in person appointments. The patient expressed understanding and agreed to proceed.    LOCATION: Patient: Home  Provider: Home office   History of Present Illness:  Pt was referred by Dr. Arfeen for OP therapy for bipolar disorder and anxiety.  Treatment Goal Addressed:  Pt will meet with clinician weekly for therapy to monitor for progress towards goals and address any barriers to success; Reduce depression from average severity level of 6/10 down to a 4/10 in next 6 months by engaging in 1-2 positive coping skills daily as part of developing self-care routine; Reduce average anxiety level from 7/10 down to 5/10 in next 6 months by utilizing 1-2 relaxation skills/grounding skills per day, such as mindful breathing, progressive muscle relaxation, positive visualizations.  Progress towards Treatment Goal: Progressing   Observations/Objective: Patient presented for today's session on time and was alert, oriented x5, with no evidence or self-report of SI/HI or A/V H.  Patient reported ongoing compliance with medication and denied any use of alcohol  or illicit substances. Clinician inquired about patient's current emotional ratings, as well as any significant changes in thoughts, feelings or behavior since previous session. Patient reported scores of  7/10 for depression, 7/10 for anxiety, 3/10 for anger/irritability. [name] and pt explored his emotional ratings, and discussed coping skills for his stressors. Pt identifies stressors and  allowed pt to explore and express thoughts and feelings associated with recent life situations and external stressors. 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 [name] asked open ended questions. [name] assessed his family dynamics, since a lot of his stressors surround his family. Again, [name] provided psychoeducation on dynamics of healthy vs unhealthy relationships associated with power and control. Again, [name] validated and normalized patient's feelings while redirecting his focus to things that he can control.        Assessment and plan: Counselor will continue to meet with patient to address treatment plan goals. Patient will continue to follow recommendations of providers and imnd implement skills learned in session, and practice between sessions. Diagnosis: Bipolar 1 disorder.    Collaboration of care:   Continue working with providers    Patient/Guardian was advised Release of Information must be obtained prior to any record release in order to collaborate their care with an outside provider. Patient/Guardian was advised if they have not already done so to contact the registration department to sign all necessary forms in order for us  to release information regarding their care.    Consent: Patient/Guardian gives verbal consent for treatment and assignment of benefits for services provided during this visit. Patient/Guardian expressed understanding and agreed to proceed.     Follow Up Instructions:  I discussed the assessment and treatment plan with the patient. The patient was provided an opportunity to ask questions and all were answered. The patient agreed with the plan and demonstrated an understanding of the instructions.   The patient was advised to call back or seek an in-person evaluation if the symptoms worsen or if the condition fails to improve as anticipated.  I provided 60 minutes of non-face-to-face  time during this  encounter.   Jessyca Sloan S, LCAS 06/23/24

## 2024-06-23 NOTE — Assessment & Plan Note (Addendum)
 New ncreased urinary frequency, burning when urinating. Recent ER visit for several kidney stones 2 wks ago, passed out w/L flank pain. Believed to have since been passed after lithotripsy. R/o UTI... Check UA Abx if needed (Septra  DS)

## 2024-06-23 NOTE — Assessment & Plan Note (Signed)
 R shoulder reverse arthroplasty on 07/01/24 dy Dr Melita - pending He will need a Foley post-op due to h/o urinary retention

## 2024-06-23 NOTE — Assessment & Plan Note (Signed)
 Check CBC

## 2024-06-24 ENCOUNTER — Telehealth (HOSPITAL_COMMUNITY): Payer: Self-pay

## 2024-06-24 ENCOUNTER — Telehealth: Payer: Self-pay | Admitting: Internal Medicine

## 2024-06-24 DIAGNOSIS — F5101 Primary insomnia: Secondary | ICD-10-CM

## 2024-06-24 MED ORDER — ZOLPIDEM TARTRATE 5 MG PO TABS: 5.0000 mg | ORAL_TABLET | Freq: Every evening | ORAL | 0 refills | Status: DC | PRN

## 2024-06-24 NOTE — Telephone Encounter (Signed)
 Lamictal  prior shara is currently pending. I will call patient about the Ambien .

## 2024-06-24 NOTE — Telephone Encounter (Signed)
 Please start prior authorization for Lamictal .  I sent Ambien  5 mg to take as needed to [facility].  His memory issue could be polypharmacy.  I will discuss with him in detail on his next appointment.

## 2024-06-24 NOTE — Telephone Encounter (Signed)
 Copied from CRM #[id number]. Topic: Clinical - Medication Question >> Jun 24, 2024 11:24 AM Drema MATSU wrote: Reason for CRM: Patient stated that prescription for tadalafil  (CIALIS ) 5 MG tablet is too [facility] he wants it sent to [facility] [phone]. He also wants to know if the quantity to be increased to 90.

## 2024-06-24 NOTE — Telephone Encounter (Signed)
 Patient called to let me know that his Lamictal  needs a prior auth. He also said that he under went extensive testing for dementia and was told that he is fine and there is no indications for dementia. He would like to have his Ambien  back. Please review and advise, thank you

## 2024-06-24 NOTE — Patient Instructions (Signed)
 SURGICAL WAITING ROOM VISITATION Patients having surgery or a procedure may have no more than 2 support people in the waiting area - these visitors may rotate in the visitor waiting room.   Due to an increase in RSV and influenza rates and associated hospitalizations, children ages 44 and under may not visit patients in [facility] hospitals. If the patient needs to stay at the hospital during part of their recovery, the visitor guidelines for inpatient rooms apply.  PRE-OP VISITATION  Pre-op nurse will coordinate an appropriate time for 1 support person to accompany the patient in pre-op.  This support person may not rotate.  This visitor will be contacted when the time is appropriate for the visitor to come back in the pre-op area.  Please refer to the [facility] website for the visitor guidelines for Inpatients (after your surgery is over and you are in a regular room).  You are not required to quarantine at this time prior to your surgery. However, you must do this: Hand Hygiene often Do NOT share personal items Notify your provider if you are in close contact with someone who has COVID or you develop fever 100.4 or greater, new onset of sneezing, cough, sore throat, shortness of breath or body aches.  If you test positive for Covid or have been in contact with anyone that has tested positive in the last 10 days please notify you surgeon.    Your procedure is scheduled on: 07/01/24   Report to [facility] Main Entrance: [place] entrance where the [employer] is available.   Report to admitting at: 5:15 AM  Call this number if you have any questions or problems the morning of surgery [phone]  FOLLOW ANY ADDITIONAL PRE OP INSTRUCTIONS YOU RECEIVED FROM YOUR SURGEON'S OFFICE!!!  Do not eat food after Midnight the night prior to your surgery/procedure.  After Midnight you may have the following liquids until: 4:30 AM DAY OF SURGERY  Clear Liquid Diet Water Black  Coffee (sugar ok, NO MILK/CREAM OR CREAMERS)  Tea (sugar ok, NO MILK/CREAM OR CREAMERS) regular and decaf                             Plain Jell-O  with no fruit (NO RED)                                           Fruit ices (not with fruit pulp, NO RED)                                     Popsicles (NO RED)                                                                  Juice: NO CITRUS JUICES: only apple, WHITE grape, WHITE cranberry Sports drinks like Gatorade or Powerade (NO RED)   The day of surgery:  Drink ONE (1) Pre-Surgery Clear Ensure at : 4:30 AM the morning of surgery. Drink in one sitting. Do not sip.  This drink was given  to you during your hospital pre-op appointment visit. Nothing else to drink after completing the Pre-Surgery Clear Ensure or G2 : No candy, chewing gum or throat lozenges.    Oral Hygiene is also important to reduce your risk of infection.        Remember - BRUSH YOUR TEETH THE MORNING OF SURGERY WITH YOUR REGULAR TOOTHPASTE  Do NOT smoke after Midnight the night before surgery.  STOP TAKING all Vitamins, Herbs and supplements 1 week before your surgery.   Take ONLY these medicines the morning of surgery with A SIP OF WATER: lamotrigine ,tamsulosin ,pantoprazole .Tylenol  as needed.Eye drops as usual.                   You may not have any metal on your body including hair pins, jewelry, and body piercing  Do not wear lotions, powders, perfumes / cologne, or deodorant.   Men may shave face and neck.  Contacts, Hearing Aids, dentures or bridgework may not be worn into surgery. DENTURES WILL BE REMOVED PRIOR TO SURGERY PLEASE DO NOT APPLY Poly grip OR ADHESIVES!!!  You may bring a small overnight bag with you on the day of surgery, only pack items that are not valuable. [facility] IS NOT RESPONSIBLE   FOR VALUABLES THAT ARE LOST OR STOLEN.   Patients discharged on the day of surgery will not be allowed to drive home.  Someone NEEDS to stay with you for the  first 24 hours after anesthesia.  Do not bring your home medications to the hospital. The Pharmacy will dispense medications listed on your medication list to you during your admission in the Hospital.  Special Instructions: Bring a copy of your healthcare power of attorney and living will documents the day of surgery, if you wish to have them scanned into your [facility]- EPIC  Please read over the following fact sheets you were given: IF YOU HAVE QUESTIONS ABOUT YOUR PRE-OP INSTRUCTIONS, PLEASE CALL [phone]  PATIENT SIGNATURE_________________________________  NURSE SIGNATURE__________________________________  ________________________________________________________________________[facility]- Preparing for Total Shoulder Arthroplasty    Before surgery, you can play an important role. Because skin is not sterile, your skin needs to be as free of germs as possible. You can reduce the number of germs on your skin by using the following products. Benzoyl Peroxide Gel Reduces the number of germs present on the skin Applied twice a day to shoulder area starting two days before surgery    ==================================================================  Please follow these instructions carefully:  BENZOYL PEROXIDE 5% GEL  Please do not use if you have an allergy to benzoyl peroxide.   If your skin becomes reddened/irritated stop using the benzoyl peroxide.  Starting two days before surgery, apply as follows: Apply benzoyl peroxide in the morning and at night. Apply after taking a shower. If you are not taking a shower clean entire shoulder front, back, and side along with the armpit with a clean wet washcloth.  Place a quarter-sized dollop on your shoulder and rub in thoroughly, making sure to cover the front, back, and side of your shoulder, along with the armpit.   2 days before ____ AM   ____ PM              1 day before ____ AM   ____ PM                          Do this twice a day for two days.  (Last application  is the night before surgery, AFTER using the CHG soap as described below).  Do NOT apply benzoyl peroxide gel on the day of surgery.  Pre-operative 4 CHG Bath Instructions  DYNA-Hex 4 Chlorhexidine  Gluconate 4% Solution Antiseptic 4 fl. oz   You can play a key role in reducing the risk of infection after surgery. Your skin needs to be as free of germs as possible. You can reduce the number of germs on your skin by washing with CHG (chlorhexidine  gluconate) soap before surgery. CHG is an antiseptic soap that kills germs and continues to kill germs even after washing.   DO NOT use if you have an allergy to chlorhexidine /CHG or antibacterial soaps. If your skin becomes reddened or irritated, stop using the CHG and notify one of our RNs at   Please shower with the CHG soap starting 4 days before surgery using the following schedule:     Please keep in mind the following:  DO NOT shave, including legs and underarms, starting the day of your first shower.   You may shave your face at any point before/day of surgery.  Place clean sheets on your bed the day you start using CHG soap. Use a clean washcloth (not used since being washed) for each shower. DO NOT sleep with pets once you start using the CHG.  CHG Shower Instructions:  If you choose to wash your hair and private area, wash first with your normal shampoo/soap.  After you use shampoo/soap, rinse your hair and body thoroughly to remove shampoo/soap residue.  Turn the water OFF and apply about 3 tablespoons (45 ml) of CHG soap to a CLEAN washcloth.  Apply CHG soap ONLY FROM YOUR NECK DOWN TO YOUR TOES (washing for 3-5 minutes)  DO NOT use CHG soap on face, private areas, open wounds, or sores.  Pay special attention to the area where your surgery is being performed.  If you are having back surgery, having someone wash your back for you may be helpful. Wait 2 minutes after CHG soap is  applied, then you may rinse off the CHG soap.  Pat dry with a clean towel  Put on clean clothes/pajamas   If you choose to wear lotion, please use ONLY the CHG-compatible lotions on the back of this paper.     Additional instructions for the day of surgery: DO NOT APPLY any lotions, deodorants, cologne, or perfumes.   Put on clean/comfortable clothes.  Brush your teeth.  Ask your nurse before applying any prescription medications to the skin.   CHG Compatible Lotions   Aveeno Moisturizing lotion  Cetaphil Moisturizing Cream  Cetaphil Moisturizing Lotion  Clairol Herbal Essence Moisturizing Lotion, Dry Skin  Clairol Herbal Essence Moisturizing Lotion, Extra Dry Skin  Clairol Herbal Essence Moisturizing Lotion, Normal Skin  Curel Age Defying Therapeutic Moisturizing Lotion with Alpha Hydroxy  Curel Extreme Care Body Lotion  Curel Soothing Hands Moisturizing Hand Lotion  Curel Therapeutic Moisturizing Cream, Fragrance-Free  Curel Therapeutic Moisturizing Lotion, Fragrance-Free  Curel Therapeutic Moisturizing Lotion, Original Formula  Eucerin Daily Replenishing Lotion  Eucerin Dry Skin Therapy Plus Alpha Hydroxy Crme  Eucerin Dry Skin Therapy Plus Alpha Hydroxy Lotion  Eucerin Original Crme  Eucerin Original Lotion  Eucerin Plus Crme Eucerin Plus Lotion  Eucerin TriLipid Replenishing Lotion  Keri Anti-Bacterial Hand Lotion  Keri Deep Conditioning Original Lotion Dry Skin Formula Softly Scented  Keri Deep Conditioning Original Lotion, Fragrance Free Sensitive Skin Formula  Keri Lotion Fast Absorbing Fragrance Free Sensitive Skin Formula  Keri Lotion Fast Absorbing Softly Scented Dry Skin Formula  Keri Original Lotion  Keri Skin Renewal Lotion Keri Silky Smooth Lotion  Keri Silky Smooth Sensitive Skin Lotion  Nivea Body Creamy Conditioning Patent Examiner Moisturizing Lotion Nivea Crme  Nivea Skin Firming  Lotion  NutraDerm 30 Skin Lotion  NutraDerm Skin Lotion  NutraDerm Therapeutic Skin Cream  NutraDerm Therapeutic Skin Lotion  ProShield Protective Hand Cream  Provon moisturizing lotion  Incentive Spirometer  An incentive spirometer is a tool that can help keep your lungs clear and active. This tool measures how well you are filling your lungs with each breath. Taking long deep breaths may help reverse or decrease the chance of developing breathing (pulmonary) problems (especially infection) following: A long period of time when you are unable to move or be active. BEFORE THE PROCEDURE  If the spirometer includes an indicator to show your best effort, your nurse or respiratory therapist will set it to a desired goal. If possible, sit up straight or lean slightly forward. Try not to slouch. Hold the incentive spirometer in an upright position. INSTRUCTIONS FOR USE  Sit on the edge of your bed if possible, or sit up as far as you can in bed or on a chair. Hold the incentive spirometer in an upright position. Breathe out normally. Place the mouthpiece in your mouth and seal your lips tightly around it. Breathe in slowly and as deeply as possible, raising the piston or the ball toward the top of the column. Hold your breath for 3-5 seconds or for as long as possible. Allow the piston or ball to fall to the bottom of the column. Remove the mouthpiece from your mouth and breathe out normally. Rest for a few seconds and repeat Steps 1 through 7 at least 10 times every 1-2 hours when you are awake. Take your time and take a few normal breaths between deep breaths. The spirometer may include an indicator to show your best effort. Use the indicator as a goal to work toward during each repetition. After each set of 10 deep breaths, practice coughing to be sure your lungs are clear. If you have an incision (the cut made at the time of surgery), support your incision when coughing by placing a pillow or  rolled up towels firmly against it. Once you are able to get out of bed, walk around indoors and cough well. You may stop using the incentive spirometer when instructed by your caregiver.  RISKS AND COMPLICATIONS Take your time so you do not get dizzy or light-headed. If you are in pain, you may need to take or ask for pain medication before doing incentive spirometry. It is harder to take a deep breath if you are having pain. AFTER USE Rest and breathe slowly and easily. It can be helpful to keep track of a log of your progress. Your caregiver can provide you with a simple table to help with this. If you are using the spirometer at home, follow these instructions: SEEK MEDICAL CARE IF:  You are having difficultly using the spirometer. You have trouble using the spirometer as often as instructed. Your pain medication is not giving enough relief while using the spirometer. You develop fever of 100.5 F (38.1 C) or higher. SEEK IMMEDIATE MEDICAL CARE IF:  You cough up bloody sputum that had not been present before. You develop fever of 102 F (38.9 C) or greater.  You develop worsening pain at or near the incision site. MAKE SURE YOU:  Understand these instructions. Will watch your condition. Will get help right away if you are not doing well or get worse. Document Released: 12/09/2006 Document Revised: 10/21/2011 Document Reviewed: 02/09/2007 [facility] Patient Information 2014 [place], [place].   ________________________________________________________________________

## 2024-06-25 ENCOUNTER — Encounter (HOSPITAL_COMMUNITY)
Admission: RE | Admit: 2024-06-25 | Discharge: 2024-06-25 | Disposition: A | Discharge status: Home / Self Care | Source: Ambulatory Visit | Attending: Orthopedic Surgery | Admitting: Orthopedic Surgery

## 2024-06-25 ENCOUNTER — Other Ambulatory Visit: Payer: Self-pay

## 2024-06-25 ENCOUNTER — Ambulatory Visit (HOSPITAL_BASED_OUTPATIENT_CLINIC_OR_DEPARTMENT_OTHER): Admitting: Physical Therapy

## 2024-06-25 ENCOUNTER — Encounter (HOSPITAL_COMMUNITY): Payer: Self-pay

## 2024-06-25 VITALS — BP 144/91 | HR 66 | Temp 98.1°F | Ht 72.0 in | Wt 184.0 lb

## 2024-06-25 DIAGNOSIS — K449 Diaphragmatic hernia without obstruction or gangrene: Secondary | ICD-10-CM | POA: Insufficient documentation

## 2024-06-25 DIAGNOSIS — I71019 Dissection of thoracic aorta, unspecified: Secondary | ICD-10-CM | POA: Diagnosis not present

## 2024-06-25 DIAGNOSIS — Z01818 Encounter for other preprocedural examination: Secondary | ICD-10-CM

## 2024-06-25 DIAGNOSIS — K219 Gastro-esophageal reflux disease without esophagitis: Secondary | ICD-10-CM | POA: Insufficient documentation

## 2024-06-25 DIAGNOSIS — E890 Postprocedural hypothyroidism: Secondary | ICD-10-CM | POA: Diagnosis not present

## 2024-06-25 DIAGNOSIS — Z8546 Personal history of malignant neoplasm of prostate: Secondary | ICD-10-CM | POA: Diagnosis not present

## 2024-06-25 DIAGNOSIS — Z9221 Personal history of antineoplastic chemotherapy: Secondary | ICD-10-CM | POA: Diagnosis not present

## 2024-06-25 DIAGNOSIS — I2510 Atherosclerotic heart disease of native coronary artery without angina pectoris: Secondary | ICD-10-CM | POA: Diagnosis not present

## 2024-06-25 DIAGNOSIS — Z79899 Other long term (current) drug therapy: Secondary | ICD-10-CM | POA: Insufficient documentation

## 2024-06-25 DIAGNOSIS — Z8589 Personal history of malignant neoplasm of other organs and systems: Secondary | ICD-10-CM | POA: Insufficient documentation

## 2024-06-25 DIAGNOSIS — F419 Anxiety disorder, unspecified: Secondary | ICD-10-CM | POA: Insufficient documentation

## 2024-06-25 DIAGNOSIS — Z923 Personal history of irradiation: Secondary | ICD-10-CM | POA: Diagnosis not present

## 2024-06-25 DIAGNOSIS — D839 Common variable immunodeficiency, unspecified: Secondary | ICD-10-CM | POA: Diagnosis not present

## 2024-06-25 DIAGNOSIS — Z79890 Hormone replacement therapy (postmenopausal): Secondary | ICD-10-CM | POA: Insufficient documentation

## 2024-06-25 DIAGNOSIS — Z981 Arthrodesis status: Secondary | ICD-10-CM | POA: Insufficient documentation

## 2024-06-25 DIAGNOSIS — M75101 Unspecified rotator cuff tear or rupture of right shoulder, not specified as traumatic: Secondary | ICD-10-CM | POA: Diagnosis not present

## 2024-06-25 DIAGNOSIS — F319 Bipolar disorder, unspecified: Secondary | ICD-10-CM | POA: Diagnosis not present

## 2024-06-25 DIAGNOSIS — I10 Essential (primary) hypertension: Secondary | ICD-10-CM | POA: Diagnosis not present

## 2024-06-25 DIAGNOSIS — M1990 Unspecified osteoarthritis, unspecified site: Secondary | ICD-10-CM | POA: Diagnosis not present

## 2024-06-25 DIAGNOSIS — Z01812 Encounter for preprocedural laboratory examination: Secondary | ICD-10-CM | POA: Diagnosis not present

## 2024-06-25 DIAGNOSIS — Z85850 Personal history of malignant neoplasm of thyroid: Secondary | ICD-10-CM | POA: Diagnosis not present

## 2024-06-25 LAB — SURGICAL PCR SCREEN
MRSA, PCR: NEGATIVE
Staphylococcus aureus: POSITIVE — AB

## 2024-06-25 NOTE — Progress Notes (Signed)
 PCR: + STAPH.

## 2024-06-25 NOTE — Progress Notes (Signed)
 For Anesthesia: PCP - Plotnikov, Karlynn GAILS, MD  Cardiologist - Pietro Redell RAMAN, MD  Clearance: Jerilynn Lamarr HERO, NP : 06/04/24 Bowel Prep reminder:  Chest x-ray - CT coronary: 03/18/23 EKG - 06/01/24 Stress Test -  ECHO - 08/01/11 Cardiac Cath - 01/16/2006 Pacemaker/ICD device last checked: Pacemaker orders received: Device Rep notified:  Spinal Cord Stimulator:N/A  Sleep Study - N/A CPAP -   Fasting Blood Sugar - N/A Checks Blood Sugar _____ times a day Date and result of last Hgb A1c-  Last dose of GLP1 agonist- N/A GLP1 instructions: Hold 7 days prior to schedule (Hold 24 hours-daily)   Last dose of SGLT-2 inhibitors- N/A SGLT-2 instructions: Hold 72 hours prior to surgery  Blood Thinner Instructions:N/A Last Dose: Time last taken:  Aspirin  Instructions: Will be hold 7 days before surgery. Last Dose: Time last taken:  Activity level: Can go up a flight of stairs and activities of daily living without stopping and without chest pain and/or shortness of breath   Able to exercise without chest pain and/or shortness of breath  Anesthesia review: Hx: Thoracic aneurism,PVD,CAD,HTN.  Patient denies shortness of breath, fever, cough and chest pain at PAT appointment   Patient verbalized understanding of instructions that were reviewed over the telephone.

## 2024-06-27 ENCOUNTER — Encounter: Payer: Self-pay | Admitting: Internal Medicine

## 2024-06-27 DIAGNOSIS — C61 Malignant neoplasm of prostate: Secondary | ICD-10-CM | POA: Diagnosis not present

## 2024-06-27 DIAGNOSIS — Z8546 Personal history of malignant neoplasm of prostate: Secondary | ICD-10-CM | POA: Diagnosis not present

## 2024-06-28 ENCOUNTER — Encounter (HOSPITAL_COMMUNITY): Payer: Self-pay

## 2024-06-28 ENCOUNTER — Other Ambulatory Visit: Payer: Self-pay | Admitting: Internal Medicine

## 2024-06-28 MED ORDER — TADALAFIL 5 MG PO TABS
5.0000 mg | ORAL_TABLET | Freq: Every day | ORAL | 3 refills | Status: AC
Start: 2024-06-28 — End: ?

## 2024-06-28 NOTE — Addendum Note (Signed)
 Addended by: HEDDY IP R on: 06/28/2024 09:27 AM   Modules accepted: Orders

## 2024-06-28 NOTE — Telephone Encounter (Signed)
 Lamictal  approved 06/25/2024 - 06/25/2025

## 2024-06-28 NOTE — Telephone Encounter (Signed)
 Okay.  Please send to [employer].  Thanks

## 2024-06-28 NOTE — Progress Notes (Signed)
 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  Prior complication from anesthesia includes urinary retention, bradycardia  Patient seen by cardiology on 04/14/2024 for routine follow-up.  He has known nonobstructive CAD by imaging and has a TAA measuring 4.2 cm by last CT in August 2024. Stable at that visit without symptoms. Clearance in 10/24 telephone encounter:  Patients RCIR score is 0.4% -Alex [name] was able to achieve greater than 4 METS without cardiac limitations. He was also free of any acute cardiac complaints. Therefore, based on ACC/AHA guidelines, the patient would be at acceptable risk for the planned procedure without further cardiovascular testing. The patient was advised that if he develops new symptoms prior to surgery to contact our office to arrange for a follow-up visit, and he verbalized understanding.  Patient seen in the ED on 06/01/2024 due to syncopal episode with flank pain.  He was diagnosed with a kidney stone which ultimately was passed and did not require any intervention.  Last seen by PCP on 06/23/24 for urinary symptoms. Prescribed Bactrim . UA was normal. Per Alex [name]:   R shoulder reverse arthroplasty on 07/01/24 dy Alex [name] - pending He will need a Foley post-op due to h/o urinary retention  Patient follows with Oncology for hx of CVID, periorbital SCC of R eye,  thyroid  cancer. Seen on 02/26/24. Stable at that visit. Getting IVIG monthly and IV iron  infusions periodically.  VS: BP (!) 144/91   Pulse 66   Temp 36.7 C (Oral)   Ht 6' (1.829 m)   Wt 83.5 kg   SpO2 99%   BMI 24.95 kg/m   PROVIDERS: Alex [name], Alex GAILS, MD   LABS: Labs reviewed: Acceptable for surgery. (all labs ordered are listed, but only abnormal results are displayed)  Labs Reviewed  SURGICAL PCR SCREEN - Abnormal; Notable for the following components:      Result Value   Staphylococcus aureus POSITIVE (*)    All other components within normal limits    CTA chest 03/18/2023 :  IMPRESSION: 1. Ascending aortic aneurysm, 4.2 cm. Recommend annual imaging followup by CTA or MRA. This recommendation follows 2010 ACCF/AHA/AATS/ACR/ASA/SCA/SCAI/SIR/STS/SVM Guidelines for the Diagnosis and Management of Patients with Thoracic Aortic Disease. Circulation. 2010; 121: Z733-z630. Aortic aneurysm NOS (ICD10-I71.9) 2.  Aortic Atherosclerosis (ICD10-I70.0).    EKG 06/01/2024:  Sinus rhythm Borderline prolonged PR interval Left anterior fascicular block Abnormal R-wave progression, late transition Left ventricular hypertrophy  CTA coronary 03/18/2023:  IMPRESSION: 1. Coronary calcium  score of 16.7. This was 24th percentile for age-, sex, and race-matched controls.   2. Total plaque volume 86 mm3 which is 11th percentile for age- and sex-matched controls (calcified plaque 3 mm3; non-calcified plaque 83 mm3). TPV is mild.   3. Normal coronary origin with right dominance.   4. Minimal CAD (<25%) in the LAD/RCA.   5. Ascending aortic aneurysm up to 42 mm. No significant change from prior  study.  Echo 08/01/2011:  Study Conclusions  Left ventricle: The cavity size was normal. Wall thickness was normal. Systolic function was normal. The estimated ejection fraction was in the range of 55% to 60%. Wall motion was normal; there were no regional wall  motion abnormalities. Doppler parameters are consistent with abnormal left ventricular relaxation (grade 1 diastolic dysfunction).  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 Maude [name], oncology. Receives monthly IVIG 40 grams.   Depression    Epicondylitis    right elbow   GERD (gastroesophageal reflux disease)    Glaucoma BOTH EYES   RIGHT EYE RADIATION DAMAGE   Hearing loss    Bilateral   Hepatic cyst    Several, The lesion of concern in segment 6 of the liver has single large portal vein and hepatic vein branches extending to tt, in a pattern of enhancement which mirrors these vascular structures. 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 9 CM X 4. 3 CM PER NOV 06-14-17  CHEST CTFOLOWED BY Alex PIETRO YEARLY FOR   Positional vertigo    HX OF WITH SINUS INFECTIONS   Prostate cancer (HCC)    Follows w/ Alex. Debby Polascik @ [facility].   Radial head fracture    Right   Squamous cell carcinoma of skin 06/04/2016   in situ-crown of scalp  Squamous cell carcinoma of skin 04/22/2017   in situ-crown scalp (txpbx)   Thoracic aortic aneurysm 06/14/2017   last CT 4.1 CM Mild   Tinnitus    CONSTANT   Ulnar nerve compression    right elbow   Unsteady gait    especially with stairs, depth perception off   Urinary hesitancy    Wears glasses     Past Surgical History:  Procedure Laterality Date   CARDIAC CATHETERIZATION  01-16-2006  Alex DEBBY WALL   MILD CORONARY ATHEROSCLEROSIS/ MID TO DISTAL LAD 40% STENOSIS/ LVF 50-55%   CARPAL TUNNEL RELEASE Right 11/03/2017   Procedure: RIGHT HAND CARPAL TUNNEL RELEASE;  Surgeon: Shari Easter, MD;  Location: [facility] [place];  Service: Orthopedics;  Laterality: Right;   CATARACT EXTRACTION Right    COLONSCOPY  2017 LAST DONE   MULTIPLE   CYST EXCISION Right 09/04/2023   Procedure: EXCISION CYST RIGHT SHOULDER;  Surgeon: Eletha Boas, MD;  Location: [facility];  Service: General;  Laterality: Right;  LOCAL & MAC    ENDOSCOPY  LAST 2017   MULTIPLE DONE DILATION DONE ALSO   ESOPHAGOGASTRODUODENOSCOPY (EGD) WITH PROPOFOL  N/A 02/24/2018   Procedure: ESOPHAGOGASTRODUODENOSCOPY (EGD) WITH PROPOFOL ;  Surgeon: Celestia Agent, MD;  [facility];  Service: Endoscopy;  Laterality: N/A;   EXCISION RADIAL HEAD Right 11/03/2017   Procedure: RIGHT PROXIMAL RADIUS RADIAL HEAD RESECTION AND JOINT DEBRIDEMENT;  Surgeon: Shari Easter, MD;  Location: [facility];  Service: Orthopedics;  Laterality: Right;   EXTRACORPOREAL SHOCK WAVE LITHOTRIPSY Right 11/20/2020   Procedure: EXTRACORPOREAL SHOCK WAVE LITHOTRIPSY (ESWL);  Surgeon: Alvaro Hummer, MD;  Location: [facility];  Service: Urology;  Laterality: Right;  75 MINS   KNEE ARTHROSCOPY  05/01/2012   Procedure: ARTHROSCOPY KNEE;  Surgeon: Reyes JAYSON Billing, MD;  Location: [facility];  Service: Orthopedics;  Laterality: Left;  debridement and removal of loose body   LEFT ANKLE ARTHROSCOPY W/ DEBRIDEMENT  05/12/2007   LEFT HYDROCELECTOMY  03/29/2005   AND REPAIR LEFT INGUINAL HERNIA W/ MESH   MOHS SURGERY  2002   RIGHT ORBITAL CANCER   NASAL ENDOSCOPY  08/07/2005   RIGHT EPISTAXIS  / POST SEPTOPLASTY  (HX RIGHT ORBITAL CA & S/P RADIATION/ NECROSIS ANTERIOR END OF BOTH INFERIOR TURBINATES)   occuloplastic surgery  2002   PARS PLANA VITRECTOMY  11/06/2004   RIGHT EYE RADIATION RETINOPATHY W/ HEMORRHAGE   PROSTATE ABLATION N/A 06/2022   RADIAL HEAD ARTHROPLASTY Right 06/15/2018   Procedure: RIGHT ELBOW PROXIMAL RADIOULNAR JOINT DEBRIDEMENT AND ARTHROPLASTY;  Surgeon: Shari Easter, MD;  Location: [facility];  Service: Orthopedics;  Laterality: Right;  BLOCK WITH SEDATION   REPAIR UNDESENDED RIGHT TESTICLE / RIGHT INGUINAL HERNIA  AGE 58   RIGHT ANKLE ARTHROSCOPY W/ EXTENSIVE DEBRIDEMENT  04/05/2008   x2   RIGHT SHOULDER SURGERY  2006   RIGHT SUPRAOMOHYOID NECK DISSECTION   03/08/2003   ZONES 1,2,3;    SUBMANDIBULAR MASS / METASTATIC SQUAMOUS CELL CARCINOMA RIGHT NECK   SAVORY DILATION N/A 02/24/2018   Procedure: SAVORY DILATION;  Surgeon: Celestia Agent, MD;  [facility];  Service: Endoscopy;  Laterality: N/A;   SEPTOPLASTY  06/2005   SHOULDER ARTHROSCOPY Left    SHOULDER ARTHROSCOPY W/ SUBACROMIAL DECOMPRESSION AND DISTAL CLAVICLE EXCISION  10/09/2008   AND DEBRIDEMENT OF RIGHT SHOULDER IMPINGEMENT & AC JOINT ARTHRITIS   SPINE SURGERY  2016   l 3 TO l 4 PLATE AND SCREWS  TOTAL THYROIDECTOMY  11/03/2001   PAPILLARY THYROID  CARCINOMA   TRANSTHORACIC ECHOCARDIOGRAM  07/2011   grade I diastolic dysfunction/ ef 55-60%   ULNAR NERVE TRANSPOSITION Right 04/28/2014   Procedure: RIGHT ELBOW ULNA NERVE RELEASE TRANSPOSTION AND MEDIAL EPICONDYLAR DEBRIDEMENT AND REPAIR;  Surgeon: Prentice LELON Pagan, MD;  Location: [facility] [place];  Service: Orthopedics;  Laterality: Right;    MEDICATIONS:  acetaminophen  (TYLENOL ) 500 MG tablet   aspirin  EC 81 MG tablet   atorvastatin  (LIPITOR) 40 MG tablet   Coenzyme Q10 (CO Q 10 PO)   erythromycin ophthalmic ointment   HIZENTRA  4 GM/20ML SOSY   ketorolac  (TORADOL ) 10 MG tablet   LamoTRIgine  300 MG TB24 24 hour tablet   latanoprost  (XALATAN ) 0.005 % ophthalmic solution   levothyroxine  (SYNTHROID ) 150 MCG tablet   meloxicam  (MOBIC ) 15 MG tablet   Multiple Vitamin (MULTI-VITAMINS) TABS   nitroGLYCERIN  (NITROLINGUAL ) 0.4 MG/SPRAY spray   OMEGA-3 KRILL OIL PO   ondansetron  (ZOFRAN -ODT) 8 MG disintegrating tablet   oxyCODONE  (ROXICODONE ) 5 MG immediate release tablet   pantoprazole  (PROTONIX ) 40 MG tablet   propranolol  (INDERAL ) 40 MG tablet   sildenafil  (REVATIO ) 20 MG tablet   sulfamethoxazole -trimethoprim  (BACTRIM  DS) 800-160 MG tablet   tadalafil  (CIALIS ) 5 MG tablet   tamsulosin  (FLOMAX ) 0.4 MG CAPS capsule   telmisartan  (MICARDIS ) 80 MG tablet   Testosterone  20.25 MG/ACT (1.62%) GEL   timolol  (TIMOPTIC ) 0.5 % ophthalmic solution    traMADol  (ULTRAM ) 50 MG tablet   zolpidem  (AMBIEN ) 5 MG tablet   No current facility-administered medications for this encounter.   Burnard CHRISTELLA Odis DEVONNA [facility] Short Stay/Anesthesiology [facility] Phone [phone] 06/28/2024 11:16 AM

## 2024-06-28 NOTE — Anesthesia Preprocedure Evaluation (Signed)
 Anesthesia Evaluation  Patient identified by MRN, date of birth, ID band Patient awake    Reviewed: Allergy & Precautions, NPO status , Patient's Chart, lab work & pertinent test results, reviewed documented beta blocker date and time   History of Anesthesia Complications (+) history of anesthetic complications  Airway Mallampati: III  TM Distance: >3 FB Neck ROM: Full    Dental  (+) Dental Advisory Given   Pulmonary asthma    Pulmonary exam normal        Cardiovascular hypertension, Pt. on medications and Pt. on home beta blockers + CAD and + Peripheral Vascular Disease  Normal cardiovascular exam     Neuro/Psych  PSYCHIATRIC DISORDERS Anxiety Depression Bipolar Disorder    Tinnitus   Neuromuscular disease    GI/Hepatic Neg liver ROS, hiatal hernia, PUD,GERD  Medicated and Controlled,,  Endo/Other  Hypothyroidism    Renal/GU negative Renal ROS    Prostate cancer     Musculoskeletal  (+) Arthritis ,    Abdominal   Peds  Hematology  CVID    Anesthesia Other Findings Hx orbital cancer w/ mets to neck Hx postop urinary retention   Reproductive/Obstetrics                              Anesthesia Physical Anesthesia Plan  ASA: 3  Anesthesia Plan: General   Post-op Pain Management: Regional block* and Tylenol  PO (pre-op)*   Induction: Intravenous  PONV Risk Score and Plan: 2 and Treatment may vary due to age or medical condition, Ondansetron  and Propofol  infusion  Airway Management Planned: Oral ETT  Additional Equipment: None  Intra-op Plan:   Post-operative Plan: Extubation in OR  Informed Consent: I have reviewed the patients History and Physical, chart, labs and discussed the procedure including the risks, benefits and alternatives for the proposed anesthesia with the patient or authorized representative who has indicated his/her understanding and acceptance.      Dental advisory given  Plan Discussed with: CRNA and Anesthesiologist  Anesthesia Plan Comments: (See PAT note from 11/14 )         Anesthesia Quick Evaluation

## 2024-06-29 ENCOUNTER — Encounter (HOSPITAL_COMMUNITY): Payer: Self-pay | Admitting: Licensed Clinical Social Worker

## 2024-06-29 ENCOUNTER — Encounter: Payer: Self-pay | Admitting: Internal Medicine

## 2024-06-29 ENCOUNTER — Ambulatory Visit (INDEPENDENT_AMBULATORY_CARE_PROVIDER_SITE_OTHER): Admitting: Licensed Clinical Social Worker

## 2024-06-29 DIAGNOSIS — E291 Testicular hypofunction: Secondary | ICD-10-CM | POA: Diagnosis not present

## 2024-06-29 DIAGNOSIS — F319 Bipolar disorder, unspecified: Secondary | ICD-10-CM

## 2024-06-29 DIAGNOSIS — N200 Calculus of kidney: Secondary | ICD-10-CM | POA: Diagnosis not present

## 2024-06-29 DIAGNOSIS — C61 Malignant neoplasm of prostate: Secondary | ICD-10-CM | POA: Diagnosis not present

## 2024-06-29 DIAGNOSIS — N202 Calculus of kidney with calculus of ureter: Secondary | ICD-10-CM | POA: Diagnosis not present

## 2024-06-29 NOTE — Progress Notes (Signed)
 Virtual virtual Video Note  I connected with Alex [name] on 06/29/2024 at  2-2:45pm EST by video-enabled virtual visit. I verified that I am speaking with the correct person using  two identifiers.I discussed the limitations of evaluation and management by telemedicine and the availability of in person appointments. The patient expressed understanding and agreed to proceed.    LOCATION: Patient: Home  Provider: Home office   History of Present Illness:  Pt was referred by Dr. Arfeen for OP therapy for bipolar disorder and anxiety.  Treatment Goal Addressed:  Pt will meet with clinician weekly for therapy to monitor for progress towards goals and address any barriers to success; Reduce depression from average severity level of 6/10 down to a 4/10 in next 6 months by engaging in 1-2 positive coping skills daily as part of developing self-care routine; Reduce average anxiety level from 7/10 down to 5/10 in next 6 months by utilizing 1-2 relaxation skills/grounding skills per day, such as mindful breathing, progressive muscle relaxation, positive visualizations.  Progress towards Treatment Goal: Progressing   Observations/Objective: Patient presented for today's session on time and was alert, oriented x5, with no evidence or self-report of SI/HI or A/V H.  Patient reported ongoing compliance with medication and denied any use of alcohol  or illicit substances. Clinician inquired about patient's current emotional ratings, as well as any significant changes in thoughts, feelings or behavior since previous session. Patient reported scores of  7/10 for depression, 7/10 for anxiety, 3/10 for anger/irritability. [name] and pt explored his emotional ratings, and discussed coping skills for his stressors. Pt identifies stressors and  allowed pt to explore and express thoughts and feelings associated with recent life situations and external stressors. Pt reports, my prostate cancer has returned [name] asked open  ended questions about next steps, prognosis. I'm having should surgery Thursday and then the dr will address my cancer return.[name] and pt reviewed his continued stressors: marital issues, unfair division of martial finances, son's mental health, physical health, future, pt's sexual health after prostate cancer, lack of marital communication, upcoming shoulder replacement surgery. [name] asked open ended questions. [name] used CBT to assist pt in staying positive, focusing on his current surgery, recovery and then focusing on the return of his prostate cancer.        Assessment and plan: Counselor will continue to meet with patient to address treatment plan goals. Patient will continue to follow recommendations of providers and imnd implement skills learned in session, and practice between sessions. Diagnosis: Bipolar 1 disorder.    Collaboration of care:   Continue working with providers    Patient/Guardian was advised Release of Information must be obtained prior to any record release in order to collaborate their care with an outside provider. Patient/Guardian was advised if they have not already done so to contact the registration department to sign all necessary forms in order for us  to release information regarding their care.    Consent: Patient/Guardian gives verbal consent for treatment and assignment of benefits for services provided during this visit. Patient/Guardian expressed understanding and agreed to proceed.     Follow Up Instructions:  I discussed the assessment and treatment plan with the patient. The patient was provided an opportunity to ask questions and all were answered. The patient agreed with the plan and demonstrated an understanding of the instructions.   The patient was advised to call back or seek an in-person evaluation if the symptoms worsen or if the condition fails to improve as anticipated.  I provided 45 minutes of non-face-to-face time during this  encounter.   Marv Alfrey S, LCAS 06/29/24

## 2024-06-30 ENCOUNTER — Ambulatory Visit (HOSPITAL_COMMUNITY): Admitting: Licensed Clinical Social Worker

## 2024-07-01 ENCOUNTER — Encounter (HOSPITAL_COMMUNITY)
Admission: RE | Disposition: A | Discharge status: Home / Self Care | Payer: Self-pay | Source: Ambulatory Visit | Attending: Orthopedic Surgery

## 2024-07-01 ENCOUNTER — Ambulatory Visit (HOSPITAL_COMMUNITY): Payer: Self-pay | Admitting: Physician Assistant

## 2024-07-01 ENCOUNTER — Encounter (HOSPITAL_COMMUNITY): Payer: Self-pay | Admitting: Orthopedic Surgery

## 2024-07-01 ENCOUNTER — Ambulatory Visit (HOSPITAL_COMMUNITY): Payer: Self-pay | Admitting: Anesthesiology

## 2024-07-01 ENCOUNTER — Ambulatory Visit (HOSPITAL_COMMUNITY)
Admission: RE | Admit: 2024-07-01 | Discharge: 2024-07-01 | Disposition: A | Discharge status: Home / Self Care | Source: Ambulatory Visit | Attending: Orthopedic Surgery | Admitting: Orthopedic Surgery

## 2024-07-01 DIAGNOSIS — M75101 Unspecified rotator cuff tear or rupture of right shoulder, not specified as traumatic: Secondary | ICD-10-CM | POA: Insufficient documentation

## 2024-07-01 DIAGNOSIS — E039 Hypothyroidism, unspecified: Secondary | ICD-10-CM | POA: Insufficient documentation

## 2024-07-01 DIAGNOSIS — I739 Peripheral vascular disease, unspecified: Secondary | ICD-10-CM | POA: Insufficient documentation

## 2024-07-01 DIAGNOSIS — F319 Bipolar disorder, unspecified: Secondary | ICD-10-CM | POA: Diagnosis not present

## 2024-07-01 DIAGNOSIS — Z8589 Personal history of malignant neoplasm of other organs and systems: Secondary | ICD-10-CM | POA: Insufficient documentation

## 2024-07-01 DIAGNOSIS — M19011 Primary osteoarthritis, right shoulder: Secondary | ICD-10-CM | POA: Diagnosis not present

## 2024-07-01 DIAGNOSIS — M25711 Osteophyte, right shoulder: Secondary | ICD-10-CM | POA: Diagnosis not present

## 2024-07-01 DIAGNOSIS — I10 Essential (primary) hypertension: Secondary | ICD-10-CM | POA: Insufficient documentation

## 2024-07-01 DIAGNOSIS — M12811 Other specific arthropathies, not elsewhere classified, right shoulder: Secondary | ICD-10-CM | POA: Diagnosis not present

## 2024-07-01 DIAGNOSIS — I2510 Atherosclerotic heart disease of native coronary artery without angina pectoris: Secondary | ICD-10-CM | POA: Insufficient documentation

## 2024-07-01 DIAGNOSIS — G8918 Other acute postprocedural pain: Secondary | ICD-10-CM | POA: Diagnosis not present

## 2024-07-01 HISTORY — PX: REVERSE SHOULDER ARTHROPLASTY: SHX5054

## 2024-07-01 SURGERY — ARTHROPLASTY, SHOULDER, TOTAL, REVERSE
Anesthesia: General | Site: Shoulder | Laterality: Right

## 2024-07-01 MED ORDER — PHENYLEPHRINE HCL (PRESSORS) 10 MG/ML IV SOLN
INTRAVENOUS | Status: AC
  Filled 2024-07-01: qty 1

## 2024-07-01 MED ORDER — ROCURONIUM BROMIDE 100 MG/10ML IV SOLN
INTRAVENOUS | Status: DC | PRN
  Administered 2024-07-01: 50 mg via INTRAVENOUS

## 2024-07-01 MED ORDER — PROPOFOL 10 MG/ML IV BOLUS
INTRAVENOUS | Status: DC | PRN
  Administered 2024-07-01: 150 mg via INTRAVENOUS

## 2024-07-01 MED ORDER — CHLORHEXIDINE GLUCONATE 0.12 % MT SOLN
15.0000 mL | Freq: Once | OROMUCOSAL | Status: AC
  Administered 2024-07-01: 15 mL via OROMUCOSAL

## 2024-07-01 MED ORDER — PROPOFOL 10 MG/ML IV BOLUS
INTRAVENOUS | Status: AC
  Filled 2024-07-01: qty 20

## 2024-07-01 MED ORDER — CYCLOBENZAPRINE HCL 10 MG PO TABS: 10.0000 mg | ORAL_TABLET | Freq: Three times a day (TID) | ORAL | 1 refills | Status: DC | PRN

## 2024-07-01 MED ORDER — ONDANSETRON HCL 4 MG/2ML IJ SOLN
INTRAMUSCULAR | Status: DC | PRN
Start: 2024-07-01 — End: 2024-07-01
  Administered 2024-07-01: 4 mg via INTRAVENOUS

## 2024-07-01 MED ORDER — TRANEXAMIC ACID-NACL 1000-0.7 MG/100ML-% IV SOLN
1000.0000 mg | INTRAVENOUS | Status: DC
  Filled 2024-07-01: qty 100

## 2024-07-01 MED ORDER — LACTATED RINGERS IV SOLN: INTRAVENOUS | Status: DC

## 2024-07-01 MED ORDER — FENTANYL CITRATE (PF) 100 MCG/2ML IJ SOLN
INTRAMUSCULAR | Status: DC | PRN
  Administered 2024-07-01: 50 ug via INTRAVENOUS

## 2024-07-01 MED ORDER — HYDROMORPHONE HCL 2 MG PO TABS: 2.0000 mg | ORAL_TABLET | ORAL | 0 refills | Status: DC | PRN

## 2024-07-01 MED ORDER — SUGAMMADEX SODIUM 200 MG/2ML IV SOLN
INTRAVENOUS | Status: DC | PRN
  Administered 2024-07-01: 200 mg via INTRAVENOUS

## 2024-07-01 MED ORDER — ACETAMINOPHEN 500 MG PO TABS
1000.0000 mg | ORAL_TABLET | Freq: Once | ORAL | Status: AC
  Administered 2024-07-01: 1000 mg via ORAL
  Filled 2024-07-01: qty 2

## 2024-07-01 MED ORDER — CEFAZOLIN SODIUM-DEXTROSE 2-4 GM/100ML-% IV SOLN
2.0000 g | INTRAVENOUS | Status: AC
  Administered 2024-07-01: 2 g via INTRAVENOUS
  Filled 2024-07-01: qty 100

## 2024-07-01 MED ORDER — 0.9 % SODIUM CHLORIDE (POUR BTL) OPTIME
TOPICAL | Status: DC | PRN
  Administered 2024-07-01: 1000 mL

## 2024-07-01 MED ORDER — VANCOMYCIN HCL 1000 MG IV SOLR
INTRAVENOUS | Status: AC
  Filled 2024-07-01: qty 20

## 2024-07-01 MED ORDER — BUPIVACAINE LIPOSOME 1.3 % IJ SUSP
INTRAMUSCULAR | Status: DC | PRN
  Administered 2024-07-01: 10 mL via PERINEURAL

## 2024-07-01 MED ORDER — ONDANSETRON HCL 4 MG PO TABS: 4.0000 mg | ORAL_TABLET | Freq: Three times a day (TID) | ORAL | 0 refills | Status: DC | PRN

## 2024-07-01 MED ORDER — PHENYLEPHRINE HCL-NACL 20-0.9 MG/250ML-% IV SOLN
INTRAVENOUS | Status: DC | PRN
  Administered 2024-07-01: 25 ug/min via INTRAVENOUS

## 2024-07-01 MED ORDER — ORAL CARE MOUTH RINSE: 15.0000 mL | Freq: Once | OROMUCOSAL | Status: AC

## 2024-07-01 MED ORDER — TRANEXAMIC ACID 1000 MG/10ML IV SOLN: 1000.0000 mg | INTRAVENOUS | Status: DC

## 2024-07-01 MED ORDER — ONDANSETRON HCL 4 MG/2ML IJ SOLN: 4.0000 mg | Freq: Once | INTRAMUSCULAR | Status: DC | PRN

## 2024-07-01 MED ORDER — FENTANYL CITRATE (PF) 50 MCG/ML IJ SOSY: 25.0000 ug | PREFILLED_SYRINGE | INTRAMUSCULAR | Status: DC | PRN

## 2024-07-01 MED ORDER — MELOXICAM 15 MG PO TABS: 15.0000 mg | ORAL_TABLET | Freq: Every day | ORAL | 0 refills | Status: AC | PRN

## 2024-07-01 MED ORDER — STERILE WATER FOR IRRIGATION IR SOLN
Status: DC | PRN
  Administered 2024-07-01: 2000 mL

## 2024-07-01 MED ORDER — VANCOMYCIN HCL 1000 MG IV SOLR
INTRAVENOUS | Status: DC | PRN
  Administered 2024-07-01: 1000 mg via TOPICAL

## 2024-07-01 MED ORDER — DEXAMETHASONE SODIUM PHOSPHATE 4 MG/ML IJ SOLN
INTRAMUSCULAR | Status: DC | PRN
  Administered 2024-07-01: 5 mg via INTRAVENOUS

## 2024-07-01 MED ORDER — LIDOCAINE HCL (CARDIAC) PF 100 MG/5ML IV SOSY
PREFILLED_SYRINGE | INTRAVENOUS | Status: DC | PRN
  Administered 2024-07-01: 25 mg via INTRAVENOUS

## 2024-07-01 MED ORDER — BUPIVACAINE HCL (PF) 0.5 % IJ SOLN
INTRAMUSCULAR | Status: DC | PRN
  Administered 2024-07-01: 15 mL

## 2024-07-01 MED ORDER — GLYCOPYRROLATE 0.2 MG/ML IJ SOLN
INTRAMUSCULAR | Status: DC | PRN
  Administered 2024-07-01: .1 mg via INTRAVENOUS

## 2024-07-01 MED ORDER — MIDAZOLAM HCL 2 MG/2ML IJ SOLN
INTRAMUSCULAR | Status: AC
  Filled 2024-07-01: qty 2

## 2024-07-01 MED ORDER — MIDAZOLAM HCL 5 MG/5ML IJ SOLN
INTRAMUSCULAR | Status: DC | PRN
  Administered 2024-07-01: 1 mg via INTRAVENOUS

## 2024-07-01 MED ORDER — LACTATED RINGERS IV SOLN: INTRAVENOUS | Status: DC | PRN

## 2024-07-01 MED ORDER — FENTANYL CITRATE (PF) 100 MCG/2ML IJ SOLN
INTRAMUSCULAR | Status: AC
  Filled 2024-07-01: qty 2

## 2024-07-01 SURGICAL SUPPLY — 56 items
BAG COUNTER SPONGE SURGICOUNT (BAG) IMPLANT
BAG ZIPLOCK 12X15 (MISCELLANEOUS) ×1 IMPLANT
BLADE SAW SGTL 83.5X18.5 (BLADE) ×1 IMPLANT
BNDG COHESIVE 4X5 TAN STRL LF (GAUZE/BANDAGES/DRESSINGS) ×1 IMPLANT
COOLER ICEMAN CLASSIC (MISCELLANEOUS) ×1 IMPLANT
COVER BACK TABLE 60X90IN (DRAPES) ×1 IMPLANT
COVER SURGICAL LIGHT HANDLE (MISCELLANEOUS) ×1 IMPLANT
CUP SUT UNIV REVERS 39 NEU (Shoulder) IMPLANT
DRAPE SHEET LG 3/4 BI-LAMINATE (DRAPES) ×1 IMPLANT
DRAPE SURG 17X11 SM STRL (DRAPES) ×1 IMPLANT
DRAPE SURG ORHT 6 SPLT 77X108 (DRAPES) ×2 IMPLANT
DRAPE TOP 10253 STERILE (DRAPES) ×1 IMPLANT
DRAPE U-SHAPE 47X51 STRL (DRAPES) ×1 IMPLANT
DRESSING AQUACEL AG SP 3.5X6 (GAUZE/BANDAGES/DRESSINGS) ×1 IMPLANT
DRSG AQUACEL AG ADV 3.5X 6 (GAUZE/BANDAGES/DRESSINGS) IMPLANT
DURAPREP 26ML APPLICATOR (WOUND CARE) ×1 IMPLANT
ELECT BLADE TIP CTD 4 INCH (ELECTRODE) ×1 IMPLANT
ELECT PENCIL ROCKER SW 15FT (MISCELLANEOUS) ×1 IMPLANT
ELECT REM PT RETURN 15FT ADLT (MISCELLANEOUS) ×1 IMPLANT
FACESHIELD WRAPAROUND OR TEAM (MASK) ×5 IMPLANT
GLENOID UNI REV MOD 24 +2 LAT (Joint) IMPLANT
GLENOSPHERE 39+4 LAT/24 UNI RV (Joint) IMPLANT
GLOVE BIO SURGEON STRL SZ7.5 (GLOVE) ×1 IMPLANT
GLOVE BIO SURGEON STRL SZ8 (GLOVE) ×1 IMPLANT
GLOVE SS BIOGEL STRL SZ 7 (GLOVE) ×1 IMPLANT
GLOVE SS BIOGEL STRL SZ 7.5 (GLOVE) ×1 IMPLANT
GOWN STRL SURGICAL XL XLNG (GOWN DISPOSABLE) ×2 IMPLANT
INSERT HUMERAL 39/+6 (Insert) IMPLANT
KIT BASIN OR (CUSTOM PROCEDURE TRAY) ×1 IMPLANT
KIT TURNOVER KIT A (KITS) ×1 IMPLANT
MANIFOLD NEPTUNE II (INSTRUMENTS) ×1 IMPLANT
NDL TAPERED W/ NITINOL LOOP (MISCELLANEOUS) ×1 IMPLANT
NEEDLE TAPERED W/ NITINOL LOOP (MISCELLANEOUS) ×1 IMPLANT
NS IRRIG 1000ML POUR BTL (IV SOLUTION) ×1 IMPLANT
PACK SHOULDER (CUSTOM PROCEDURE TRAY) ×1 IMPLANT
PAD ARMBOARD POSITIONER FOAM (MISCELLANEOUS) ×1 IMPLANT
PAD COLD SHLDR WRAP-ON (PAD) ×1 IMPLANT
PIN NITINOL TARGETER 2.8 (PIN) IMPLANT
PIN SET MODULAR GLENOID SYSTEM (PIN) IMPLANT
RESTRAINT HEAD UNIVERSAL NS (MISCELLANEOUS) ×1 IMPLANT
SCREW CENTRAL MOD 30MM (Screw) IMPLANT
SCREW PERI LOCK 5.5X32 (Screw) IMPLANT
SCREW PERIPHERAL 5.5X20 LOCK (Screw) IMPLANT
SLING ARM FOAM STRAP LRG (SOFTGOODS) IMPLANT
SLING ARM FOAM STRAP MED (SOFTGOODS) IMPLANT
STEM HUM UNIV REV SHLD 11 (Stem) IMPLANT
STRIP CLOSURE SKIN 1/2X4 (GAUZE/BANDAGES/DRESSINGS) ×1 IMPLANT
SUT MNCRL AB 3-0 PS2 18 (SUTURE) ×1 IMPLANT
SUT MON AB 2-0 CT1 36 (SUTURE) ×1 IMPLANT
SUT VIC AB 1 CT1 36 (SUTURE) ×1 IMPLANT
SUTURE TAPE 1.3 40 TPR END (SUTURE) ×2 IMPLANT
TOWEL GREEN STERILE FF (TOWEL DISPOSABLE) ×1 IMPLANT
TOWEL OR 17X26 10 PK STRL BLUE (TOWEL DISPOSABLE) ×1 IMPLANT
TUBE SUCTION HIGH CAP CLEAR NV (SUCTIONS) ×1 IMPLANT
TUBING CONNECTING 10 (TUBING) ×1 IMPLANT
WATER STERILE IRR 1000ML POUR (IV SOLUTION) ×2 IMPLANT

## 2024-07-01 NOTE — Transfer of Care (Signed)
 Immediate Anesthesia Transfer of Care Note  Patient: Alex [name]  Procedure(s) Performed: ARTHROPLASTY, SHOULDER, TOTAL, REVERSE (Right: Shoulder)  Patient Location: PACU  Anesthesia Type:General  Level of Consciousness: awake and alert   Airway & Oxygen Therapy: Patient Spontanous Breathing and Patient connected to face mask oxygen  Post-op Assessment: Report given to RN and Post -op Vital signs reviewed and stable  Post vital signs: Reviewed and stable  Last Vitals:  Vitals Value Taken Time  BP 149/92 07/01/24 09:48  Temp    Pulse 47 07/01/24 09:50  Resp 10 07/01/24 09:50  SpO2 97 % 07/01/24 09:50  Vitals shown include unfiled device data.  Last Pain:  Vitals:   07/01/24 0642  TempSrc: Oral  PainSc:          Complications: No notable events documented.

## 2024-07-01 NOTE — Evaluation (Signed)
 Occupational Therapy Evaluation Patient Details Name: Alex [name] MRN: [id number] DOB: 10-Oct-1955 Today's Date: 07/01/2024   History of Present Illness   Pt is s/p R total reverse shoulder arthroplasty on 11/20. 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     Clinical Impressions Pt admitted with above. Prior to admission, pt was independent in ADLs/IADLs/mobility including driving. Education completed regarding compensatory strategies for ADL tasks and functional mobility, management of sling, RUE ROM per specified parameters in the order set as indicated below, positioning of operative arm in sitting and supine and edema control, including use of Iceman Cold Therapy machine. Caregiver present for education, written handouts provided and reviewed using Teach Back and pt/caregiver verbalized/demonstrated understanding. Due to the below listed deficits, pt requires up to MAX A assistance with UB ADL tasks and CGA with +1 HHA assist with functional mobility. Caregiver will be able to provide necessary level of assistance at discharge. Pt to follow up with MD to progress rehab of the operative shoulder.      If plan is discharge home, recommend the following:   A little help with walking and/or transfers;A little help with bathing/dressing/bathroom;Assist for transportation;Assistance with cooking/housework     Functional Status Assessment   Patient has had a recent decline in their functional status and demonstrates the ability to make significant improvements in function in a reasonable and predictable amount of time.     Equipment Recommendations   None recommended by OT       Precautions/Restrictions   Precautions Precautions: Shoulder;Fall Type of Shoulder Precautions: If sitting in controlled environment, ok to come out of sling to give neck a break. Please sleep in it to protect until follow up in office. OK to use operative arm for feeding, hygiene and ADLs. Ok to instruct Pendulums and lap slides as exercises. Ok to use operative arm within the following parameters for ADL purposes. Ok for PROM, AAROM, AROM within pain tolerance and within the following ROM ER 20 ABD 45 FF 60 Shoulder Interventions: Off for dressing/bathing/exercises;Shoulder sling/immobilizer Precaution Booklet Issued: Yes (comment) Recall of Precautions/Restrictions: Impaired Required Braces or Orthoses: Sling Restrictions Weight Bearing Restrictions Per Provider Order: Yes RUE Weight Bearing Per Provider Order: Non weight bearing     Mobility Bed Mobility                    Transfers Overall transfer level: Needs assistance Equipment used: None, 1 person hand held assist Transfers: Sit to/from Stand, Bed to chair/wheelchair/BSC Sit to Stand: Contact guard assist     Step pivot transfers: Contact guard assist     General transfer comment: STS with cues to push up using LUE, cues for weight shift. +1 HHA for transfer to standard commode.      Balance Overall balance assessment: Mild deficits observed, not formally tested, History of Falls                                         ADL either performed or assessed with clinical judgement   ADL Overall ADL's : Needs assistance/impaired  Toilet Transfer: Contact guard assist;Regular Toilet;Grab bars Statistician Details (indicate cue type and reason): CGA for safety, cues for use of GB Toileting- Clothing Manipulation and Hygiene: Contact guard assist;Sit to/from stand       Functional mobility during ADLs: Contact guard assist;Cueing for sequencing;Cueing  for safety  Per orders, R shoulder parameters as follows for ADL tasks: Abd 0-45; ER 0-20; FF 0-60; No shoulder ROM; elbow/wrist/hand ROM only. While moving within specified parameters, pt/caregiver instructed on bathing and how to donn/doff shirt, placing operative arm through sleeve first when donning and off last when doffing.Pt/caregiver educated on compensatory strategies for LB ADL and strategies to reduce risk of falls.  Pt/caregiver educated on donning/doffing sling and to wear the sling at all times with the exception of ADL, and to loosen the neck strap of the sling when the operative arm is in a supported position when sitting. In sitting or supine, pt instructed to have a pillow behind and under their operative arm to provide support. If assist needed with ambulation, caregiver educated on the importance of walking on pt's non-operative side.  Education regarding use of IceMan Cold Therapy completed, including the importance of using a barrier on the shoulder prior to positioning the wrap-on pad. Pt/caregiver verbalized/demonstrated understanding. Teach Back used while caregiver assisted with dressing pt and positioning wrap-on pad to facilitate DC.      Vision Baseline Vision/History: 1 Wears glasses;6 Macular Degeneration Ability to See in Adequate Light: 0 Adequate Patient Visual Report: No change from baseline              Pertinent Vitals/Pain Pain Assessment Pain Assessment: No/denies pain     Extremity/Trunk Assessment Upper Extremity Assessment Upper Extremity Assessment: Right hand dominant;RUE deficits/detail RUE: Unable to fully assess due to immobilization   Lower Extremity Assessment Lower Extremity Assessment: Generalized weakness       Communication Communication Communication: Impaired Factors Affecting Communication: Hearing impaired   Cognition Arousal: Alert, Suspect due to medications, Lethargic Behavior During Therapy: WFL for tasks  assessed/performed               OT - Cognition Comments: slow processing (suspect due to post-anesthesia)                 Following commands: Intact       Cueing  General Comments   Cueing Techniques: Verbal cues;Gestural cues;Tactile cues  RN notified ADL edu complete.   Exercises Exercises: Shoulder, Other exercises Other Exercises Other Exercises: edu on lap slides, hand/wrist/elbow exercises and dangling for surgical RUE. pt to discuss timeline for returning to driving at follow-up   Shoulder Instructions Shoulder Instructions Donning/doffing shirt without moving shoulder: Minimal assistance Method for sponge bathing under operated UE: Minimal assistance Donning/doffing sling/immobilizer: Minimal assistance Correct positioning of sling/immobilizer: Minimal assistance ROM for elbow, wrist and digits of operated UE: Minimal assistance Sling wearing schedule (on at all times/off for ADL's): Minimal assistance Proper positioning of operated UE when showering: Minimal assistance Positioning of UE while sleeping: Minimal assistance    Home Living Family/patient expects to be discharged to:: Private residence Living Arrangements: Spouse/significant other Available Help at Discharge: Available PRN/intermittently Type of Home: House Home Access: Stairs to enter Secretary/administrator of Steps: 3 Entrance Stairs-Rails: Can reach both Home Layout: Two level;1/2 bath on main level Alternate Level Stairs-Number of Steps: flight   Bathroom Shower/Tub: Producer, Television/film/video: Standard Bathroom Accessibility: Yes   Home Equipment: Cane - quad;Cane - single point;Shower seat;Lift chair  Prior Functioning/Environment Prior Level of Function : Independent/Modified Independent;Driving;History of Falls (last six months)             Mobility Comments: falls hx, no AD ADLs Comments: independent including driving    OT Problem List: Impaired  UE functional use;Pain;Decreased knowledge of precautions;Decreased range of motion;Decreased strength;Impaired balance (sitting and/or standing)   OT Treatment/Interventions: Patient/family education;Therapeutic activities;Balance training;Self-care/ADL training;Neuromuscular education;Energy conservation;DME and/or AE instruction      OT Goals(Current goals can be found in the care plan section)   Acute Rehab OT Goals OT Goal Formulation: With patient/family Time For Goal Achievement: 07/15/24 Potential to Achieve Goals: Good   OT Frequency:  Min 2X/week       AM-PAC OT 6 Clicks Daily Activity     Outcome Measure Help from another person eating meals?: A Little Help from another person taking care of personal grooming?: A Little Help from another person toileting, which includes using toliet, bedpan, or urinal?: A Little Help from another person bathing (including washing, rinsing, drying)?: A Little Help from another person to put on and taking off regular upper body clothing?: A Little Help from another person to put on and taking off regular lower body clothing?: A Little 6 Click Score: 18   End of Session Equipment Utilized During Treatment: Gait belt;Other (comment) (sling, Iceman) Nurse Communication: Mobility status;Weight bearing status  Activity Tolerance: Patient tolerated treatment well;No increased pain Patient left: in chair;with call bell/phone within reach;with family/visitor present  OT Visit Diagnosis: Muscle weakness (generalized) (M62.81);Other abnormalities of gait and mobility (R26.89);History of falling (Z91.81)                Time: 8951-8849 OT Time Calculation (min): 62 min Charges:  OT General Charges $OT Visit: 1 Visit OT Evaluation $OT Eval Moderate Complexity: 1 Mod OT Treatments $Self Care/Home Management : 38-52 mins  Florette Thai L. Lamyra Malcolm, OTR/L  07/01/24, 12:53 PM

## 2024-07-01 NOTE — Anesthesia Procedure Notes (Signed)
 Procedure Name: Intubation Date/Time: 07/01/2024 7:47 AM  Performed by: Dartha Meckel, CRNAPre-anesthesia Checklist: Patient identified, Emergency Drugs available, Suction available and Patient being monitored Patient Re-evaluated:Patient Re-evaluated prior to induction Oxygen Delivery Method: Circle system utilized Preoxygenation: Pre-oxygenation with 100% oxygen Induction Type: IV induction Ventilation: Mask ventilation without difficulty Laryngoscope Size: Glidescope and 4 Grade View: Grade I Tube type: Oral Tube size: 7.5 mm Number of attempts: 1 Airway Equipment and Method: Stylet and Oral airway Placement Confirmation: ETT inserted through vocal cords under direct vision, positive ETCO2 and breath sounds checked- equal and bilateral Secured at: 22 cm Tube secured with: Tape Dental Injury: Teeth and Oropharynx as per pre-operative assessment

## 2024-07-01 NOTE — Discharge Instructions (Signed)

## 2024-07-01 NOTE — Anesthesia Procedure Notes (Signed)
 Anesthesia Regional Block: Interscalene brachial plexus block   Pre-Anesthetic Checklist: , timeout performed,  Correct Patient, Correct Site, Correct Laterality,  Correct Procedure, Correct Position, site marked,  Risks and benefits discussed,  Surgical consent,  Pre-op evaluation,  At surgeon's request and post-op pain management  Laterality: Right  Prep: chloraprep       Needles:  Injection technique: Single-shot  Needle Type: Echogenic Needle     Needle Length: 5cm  Needle Gauge: 21     Additional Needles:   Narrative:  Start time: 07/01/2024 7:01 AM End time: 07/01/2024 7:05 AM Injection made incrementally with aspirations every 5 mL.  Performed by: Personally  Anesthesiologist: Lucious Debby BRAVO, MD  Additional Notes: No pain on injection. No increased resistance to injection. Injection made in 5cc increments. Good needle visualization. Patient tolerated the procedure well.

## 2024-07-01 NOTE — H&P (Signed)
 Alex [name]    Chief Complaint: Right shoulder rotator cuff tear arthropathy HPI: The patient is a 68 y.o. male Well-known to our practice with remote history of right shoulder arthroscopy, presents with chronic and progressively increasing right shoulder pain related to underlying rotator cuff tear arthropathy.  He is brought to the operating room today for planned right shoulder reverse arthroplasty.  Patient does have a known history of postsurgical urinary retention for which he has previously needed to be catheterized with follow-up by his urologist.  Patient also has remote history of head and neck cancer with some residual neurologic deficits as well as anxiety and depression.  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 Maude Halt, oncology. Receives monthly IVIG 40 grams.   Depression    Epicondylitis    right elbow   GERD (gastroesophageal reflux  disease)    Glaucoma BOTH EYES   RIGHT EYE RADIATION DAMAGE   Hearing loss    Bilateral   Hepatic cyst    Several, The lesion of concern in segment 6 of the liver has single large portal vein and hepatic vein branches extending to tt, in a pattern of enhancement which mirrors these vascular structures. 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 9 CM X 4. 3 CM PER NOV 06-14-17  CHEST CTFOLOWED BY DR PIETRO YEARLY FOR   Positional vertigo    HX  OF WITH SINUS INFECTIONS   Prostate cancer [facility])    Follows w/ Dr. Debby Polascik @ [facility].   Radial head fracture    Right   Squamous cell carcinoma of skin 06/04/2016   in situ-crown of scalp   Squamous cell carcinoma of skin 04/22/2017   in situ-crown scalp (txpbx)   Thoracic aortic aneurysm 06/14/2017   last CT 4.1 CM Mild   Tinnitus    CONSTANT   Ulnar nerve compression    right elbow   Unsteady gait    especially with stairs, depth perception off   Urinary hesitancy    Wears glasses       Past Surgical History:  Procedure Laterality Date   CARDIAC CATHETERIZATION  01-16-2006  DR DEBBY WALL   MILD CORONARY  ATHEROSCLEROSIS/ MID TO DISTAL LAD 40% STENOSIS/ LVF 50-55%   CARPAL TUNNEL RELEASE Right 11/03/2017   Procedure: RIGHT HAND CARPAL TUNNEL RELEASE;  Surgeon: Shari Easter, MD;  Location: [facility];  Service: Orthopedics;  Laterality: Right;   CATARACT EXTRACTION Right    COLONSCOPY  2017 LAST DONE   MULTIPLE   CYST EXCISION Right 09/04/2023   Procedure: EXCISION CYST RIGHT SHOULDER;  Surgeon: Eletha Boas, MD;  Location: [facility];  Service: General;  Laterality: Right;  LOCAL & MAC   ENDOSCOPY  LAST 2017   MULTIPLE DONE DILATION DONE ALSO   ESOPHAGOGASTRODUODENOSCOPY (EGD) WITH PROPOFOL  N/A 02/24/2018   Procedure: ESOPHAGOGASTRODUODENOSCOPY (EGD) WITH PROPOFOL ;  Surgeon: Celestia Agent, MD;  Location: [facility];  Service: Endoscopy;  Laterality: N/A;   EXCISION RADIAL HEAD Right 11/03/2017   Procedure: RIGHT PROXIMAL RADIUS RADIAL HEAD RESECTION AND JOINT DEBRIDEMENT;  Surgeon: Shari Easter, MD;  [facility];  Service: Orthopedics;  Laterality: Right;   EXTRACORPOREAL SHOCK WAVE LITHOTRIPSY Right 11/20/2020   Procedure: EXTRACORPOREAL SHOCK WAVE LITHOTRIPSY (ESWL);  Surgeon: Alvaro Hummer, MD;  Location: [facility];  Service: Urology;  Laterality: Right;  75 MINS   KNEE ARTHROSCOPY  05/01/2012   Procedure: ARTHROSCOPY KNEE;  Surgeon: Reyes JAYSON Billing, MD;  Location: [facility];  Service: Orthopedics;  Laterality: Left;  debridement and removal of loose body   LEFT ANKLE ARTHROSCOPY W/ DEBRIDEMENT  05/12/2007   LEFT HYDROCELECTOMY  03/29/2005   AND REPAIR LEFT INGUINAL HERNIA W/ MESH   MOHS SURGERY  2002   RIGHT ORBITAL CANCER   NASAL ENDOSCOPY  08/07/2005   RIGHT EPISTAXIS  / POST SEPTOPLASTY  (HX RIGHT ORBITAL CA & S/P RADIATION/ NECROSIS ANTERIOR END OF BOTH INFERIOR TURBINATES)   occuloplastic surgery  2002   PARS PLANA VITRECTOMY  11/06/2004   RIGHT EYE RADIATION RETINOPATHY W/ HEMORRHAGE    PROSTATE ABLATION N/A 06/2022   RADIAL HEAD ARTHROPLASTY Right 06/15/2018   Procedure: RIGHT ELBOW PROXIMAL RADIOULNAR JOINT DEBRIDEMENT AND ARTHROPLASTY;  Surgeon: Shari Easter, MD;  Location: [facility] ;  Service: Orthopedics;  Laterality: Right;  BLOCK WITH SEDATION   REPAIR UNDESENDED RIGHT TESTICLE / RIGHT INGUINAL HERNIA  AGE 81   RIGHT ANKLE ARTHROSCOPY W/ EXTENSIVE DEBRIDEMENT  04/05/2008   x2   RIGHT SHOULDER SURGERY  2006   RIGHT SUPRAOMOHYOID NECK DISSECTION   03/08/2003   ZONES 1,2,3;   SUBMANDIBULAR MASS / METASTATIC SQUAMOUS CELL CARCINOMA RIGHT NECK   SAVORY DILATION N/A 02/24/2018   Procedure: SAVORY DILATION;  Surgeon: Celestia Agent, MD;  Location: [facility];  Service: Endoscopy;  Laterality: N/A;   SEPTOPLASTY  06/2005  SHOULDER ARTHROSCOPY Left    SHOULDER ARTHROSCOPY W/ SUBACROMIAL DECOMPRESSION AND DISTAL CLAVICLE EXCISION  10/09/2008   AND DEBRIDEMENT OF RIGHT SHOULDER IMPINGEMENT & AC JOINT ARTHRITIS   SPINE SURGERY  2016   l 3 TO l 4 PLATE AND SCREWS   TOTAL THYROIDECTOMY  11/03/2001   PAPILLARY THYROID  CARCINOMA   TRANSTHORACIC ECHOCARDIOGRAM  07/2011   grade I diastolic dysfunction/ ef 55-60%   ULNAR NERVE TRANSPOSITION Right 04/28/2014   Procedure: RIGHT ELBOW ULNA NERVE RELEASE TRANSPOSTION AND MEDIAL EPICONDYLAR DEBRIDEMENT AND REPAIR;  Surgeon: Prentice LELON Pagan, MD;  Location: [facility];  Service: Orthopedics;  Laterality: Right;    Family History  Problem Relation Age of Onset   Kidney disease Mother    Depression Mother    Stroke Father    COPD Father    Hypertension Father    Prostate cancer Father    Depression Sister    Rectal cancer Sister    Lung cancer Brother        Small cell   Lung cancer Brother    Depression Daughter    Drug abuse Daughter    Psychiatric Illness Son     Social History:  reports that he has never smoked. He has never used smokeless tobacco. He reports that he does not currently  use alcohol . He reports that he does not use drugs.  BMI: Estimated body mass index is 24.95 kg/m as calculated from the following:   Height as of this encounter: 6' (1.829 m).   Weight as of this encounter: 83.5 kg.  Lab Results  Component Value Date   ALBUMIN 4.0 06/01/2024   Diabetes: Patient does not have a diagnosis of diabetes. Lab Results  Component Value Date   HGBA1C 6.1 01/02/2024     Smoking Status:   reports that he has never smoked. He has never used smokeless tobacco.     Medications Prior to Admission  Medication Sig Dispense Refill   acetaminophen  (TYLENOL ) 500 MG tablet Take by mouth.     aspirin  EC 81 MG tablet Take 1 tablet by mouth daily.     atorvastatin  (LIPITOR) 40 MG tablet TAKE 1 TABLET BY MOUTH EVERY DAY (Patient taking differently: Take 40 mg by mouth at bedtime.) 90 tablet 3   Coenzyme Q10 (CO Q 10 PO) Take 1 capsule by mouth daily.      erythromycin ophthalmic ointment Place 1 Application into the right eye daily as needed (conjunctivitis).     HIZENTRA  4 GM/20ML SOSY Inject 1 Dose into the vein once a week.     LamoTRIgine  300 MG TB24 24 hour tablet Take 1 tablet (300 mg total) by mouth every morning. 90 tablet 0   latanoprost  (XALATAN ) 0.005 % ophthalmic solution Place 1 drop into both eyes at bedtime.     levothyroxine  (SYNTHROID ) 150 MCG tablet TAKE 1 TABLET BY MOUTH DAILY BEFORE BREAKFAST. (Patient taking differently: Take 150 mcg by mouth at bedtime.) 90 tablet 2   meloxicam  (MOBIC ) 15 MG tablet TAKE 1 TABLET BY MOUTH EVERY DAY AS NEEDED 90 tablet 0   Multiple Vitamin (MULTI-VITAMINS) TABS Take 1 tablet by mouth daily.     nitroGLYCERIN  (NITROLINGUAL ) 0.4 MG/SPRAY spray Place 1 spray under the tongue every 5 (five) minutes x 3 doses as needed for chest pain. Esophageal spasms 12 g 12   OMEGA-3 KRILL OIL PO Take 1 capsule by mouth daily.      ondansetron  (ZOFRAN -ODT) 8 MG disintegrating tablet Take 1 tablet (  8 mg total) by mouth every 8 (eight)  hours as needed for nausea or vomiting. 12 tablet 0   oxyCODONE  (ROXICODONE ) 5 MG immediate release tablet Take 1 tablet (5 mg total) by mouth every 6 (six) hours as needed for severe pain (pain score 7-10). 16 tablet 0   pantoprazole  (PROTONIX ) 40 MG tablet Take 1 tablet (40 mg total) by mouth daily. 90 tablet 11   propranolol  (INDERAL ) 40 MG tablet Take 1 tablet (40 mg total) by mouth 2 (two) times daily. (Patient taking differently: Take 40 mg by mouth at bedtime.) 180 tablet 3   sildenafil  (REVATIO ) 20 MG tablet Take 1 tablet (20 mg total) by mouth as needed. 30 tablet 1   tamsulosin  (FLOMAX ) 0.4 MG CAPS capsule Take 1 capsule (0.4 mg total) by mouth daily. 90 capsule 1   telmisartan  (MICARDIS ) 80 MG tablet TAKE 1 TABLET BY MOUTH EVERY DAY 90 tablet 3   Testosterone  20.25 MG/ACT (1.62%) GEL Apply 2 Pump topically daily.     timolol  (TIMOPTIC ) 0.5 % ophthalmic solution timolol  maleate 0.5 % eye drops  INSTILL 1 DROP INTO RIGHT EYE TWICE A DAY     zolpidem  (AMBIEN ) 5 MG tablet Take 1 tablet (5 mg total) by mouth at bedtime as needed for sleep. 30 tablet 0   ketorolac  (TORADOL ) 10 MG tablet Take 1 tablet (10 mg total) by mouth every 6 (six) hours as needed. (Patient not taking: Reported on 06/24/2024) 20 tablet 0   sulfamethoxazole -trimethoprim  (BACTRIM  DS) 800-160 MG tablet Take 1 tablet by mouth 2 (two) times daily. 20 tablet 0   tadalafil  (CIALIS ) 5 MG tablet Take 1 tablet (5 mg total) by mouth daily. 90 tablet 3   traMADol  (ULTRAM ) 50 MG tablet Take 1 tablet (50 mg total) by mouth every 6 (six) hours as needed for moderate pain (pain score 4-6). (Patient not taking: Reported on 06/24/2024) 12 tablet 0     Physical Exam: Right shoulder examination demonstrates painful and guarded motion as noted at his recent office visits.  He has globally decreased strength to manual muscle testing.  Examination otherwise as noted at his recent office visits.  Imaging studies confirm changes consistent with  a chronic right shoulder rotator cuff tear arthropathy.  Vitals  Weight:  [83.5 kg] 83.5 kg (11/20 0616)  Assessment/Plan  Impression: Right shoulder rotator cuff tear arthropathy  Plan of Action: Procedure(s): ARTHROPLASTY, SHOULDER, TOTAL, REVERSE  Roselle Norton M Sheryn Aldaz 07/01/2024, 6:19 AM Contact # [phone]

## 2024-07-01 NOTE — Anesthesia Postprocedure Evaluation (Signed)
 Anesthesia Post Note  Patient: Alex [name]  Procedure(s) Performed: ARTHROPLASTY, SHOULDER, TOTAL, REVERSE (Right: Shoulder)     Patient location during evaluation: PACU Anesthesia Type: General Level of consciousness: awake and alert Pain management: pain level controlled Vital Signs Assessment: post-procedure vital signs reviewed and stable Respiratory status: spontaneous breathing, nonlabored ventilation and respiratory function stable Cardiovascular status: stable and blood pressure returned to baseline Anesthetic complications: no   No notable events documented.  Last Vitals:  Vitals:   07/01/24 1015 07/01/24 1230  BP: (!) 149/102 (!) 145/94  Pulse: (!) 48 (!) 52  Resp: 14 15  Temp: (!) 36.1 C   SpO2: 96% 98%    Last Pain:  Vitals:   07/01/24 1230  TempSrc:   PainSc: 0-No pain                 Debby FORBES Like

## 2024-07-01 NOTE — Op Note (Signed)
 07/01/2024  9:34 AM  PATIENT:   Alex [name]  68 y.o. male  PRE-OPERATIVE DIAGNOSIS:  Right shoulder rotator cuff tear arthropathy  POST-OPERATIVE DIAGNOSIS: Same  PROCEDURE: Right shoulder reverse arthroplasty utilizing a press-fit size 11 Arthrex stem with a neutral metathesis, +6 polyethylene insert, 39/+4 glenosphere and a small/+2 baseplate, 30 mm lag screw  SURGEON:  Saliyah Gillin, Franky BATTLE [name]  ASSISTANTS: Randine Ricks, PA-C  Randine Ricks, PA-C was utilized as an geophysicist/field seismologist throughout this case, essential for help with positioning the patient, positioning extremity, tissue manipulation, implantation of the prosthesis, suture management, wound closure, and intraoperative decision-making.  ANESTHESIA:   General Endotracheal and interscalene block with Exparel   EBL: 150 cc  SPECIMEN: None  Drains: None   PATIENT DISPOSITION:  PACU - hemodynamically stable.    PLAN OF CARE: Discharge to home after PACU  Brief history:  Patient is a 68 year old gentleman well-known to our practice with remote history of right shoulder arthroscopic surgery, presents with chronic and progressively increasing right shoulder pain related to severe osteoarthritis as well as imaging evidence for severe rotator cuff degeneration and tearing.  Due to his increasing functional limitations and failure to respond to prolonged attempted conservative management, he is brought to the operating today for planned right shoulder reverse arthroplasty.  Preoperatively, I counseled the patient regarding treatment options and risks versus benefits thereof.  Possible surgical complications were all reviewed including potential for bleeding, infection, neurovascular injury, persistent pain, loss of motion, anesthetic complication, failure of the implant, and possible need for additional surgery. They understand and accept and agrees with our planned procedure.   Procedure in detail:  After undergoing routine  preop evaluation the patient received prophylactic antibiotics and interscalene block with Exparel  rel was established in the holding area by the anesthesia department.  Subsequently placed spine on the operating table and underwent the smooth induction of a general endotracheal anesthesia.  Placed into the beachchair position and appropriately padded and protected.  The right shoulder girdle region was sterilely prepped and draped in standard fashion.  Timeout was called.  A deltopectoral approach the right shoulder was made through an approximately 8 cm incision.  Skin flaps elevated dissection carried deeply the deltopectoral interval was then developed from proximal to distal with the vein taken laterally.  We did identify several large varicosities traversing this interval which were electrocauterized as well as ligated.  Adhesions were then divided beneath the deltoid and the conjoined tendon was mobilized and retracted medially.  The long head biceps tendon was then tenodesed at the upper border of the pectoralis major tendon with the proximal segment unroofed and excised.  Superior remnant of the rotator cuff was split from the apex of the bicipital groove to the base of the coracoid and the subscap was separated from the lesser tuberosity using electrocautery and tagged with a pair of grasping suture tape sutures.  Of note there was found to be significant calcific tendinitis and synovitis and extensive debridement and synovectomy was performed.  Capsular attachments were then divided from the anterior and inferior margins of the humeral neck and the humeral head was then delivered through the wound.  An extra medullary guide was then used to outline the proposed humeral head resection which we performed with an oscillating saw at approximately 20 degrees of retroversion.  Marginal osteophytes were then removed with a rondure and a metal cap was placed over the cut proximal humeral surface.  The glenoid was  then performed and a circumferential  labral resection was completed.  A guidepin was then directed into the center of the glenoid and the glenoid was reamed with the central followed by the peripheral reamer to a stable subchondral bony bed in preparation completed with a drill and tapped for a 30 mm lag screw.  Irrigation completed.  A baseplate was then assembled and inserted with vancomycin powder applied to the threads of the lag screw and excellent fixation was achieved.  All of the peripheral locking screws were then placed using standard technique with excellent fixation.  A 39/+4 glenosphere was then impacted onto the baseplate and a central locking screw was placed.  We then returned our attention back to the proximal humerus where the canal was opened by hand reaming and we ultimately broached up to a size 11 stem at approximate 20 degrees of retroversion.  A neutral metaphyseal reaming guide was then used to repair the metaphysis and a trial implant was placed and this showed good motion stability and soft tissue balance.  Trial was then removed.  The final short stem size 11 implant was assembled.  The canal was irrigated cleaned and dried with vancomycin powder applied in the canal.  We did harvest bone graft from the resected humeral head and with this was packed about the anterior and posterior portions of the humeral stem as it was inserted and excellent fixation was achieved.  Trial reductions were then performed and ultimately felt that a +6 poly gives the best motion stability and soft tissue balance.  The trial was then removed.  The final poly was impacted on the clean and dried implant.  A final reduction was then performed which again showed active motion stability and soft tissue balance all much to our satisfaction.  Final irrigation was then completed.  Hemostasis was obtained.  The subscapularis was mobilized and confirmed to have good elasticity and was repaired back to Thylox on the  collar of the implant using the previously placed suture tape sutures.  Deltopectoral interval was then reapproximated with a series of figure-of-eight #1 Vicryl sutures.  2-0 Monocryl used to close the subcu layer and intracuticular 3-0 Monocryl used to close the skin followed by Steri-Strips and an Aquacel dressing.  The right arm was placed into a sling.  The patient was awakened, extubated, and taken to the recovery room in stable condition.  Franky CHRISTELLA Pointer MD   Contact # [phone]

## 2024-07-02 ENCOUNTER — Encounter (HOSPITAL_BASED_OUTPATIENT_CLINIC_OR_DEPARTMENT_OTHER): Admitting: Physical Therapy

## 2024-07-02 ENCOUNTER — Encounter (HOSPITAL_COMMUNITY): Payer: Self-pay | Admitting: Orthopedic Surgery

## 2024-07-07 ENCOUNTER — Ambulatory Visit (HOSPITAL_COMMUNITY): Admitting: Licensed Clinical Social Worker

## 2024-07-07 ENCOUNTER — Ambulatory Visit (HOSPITAL_BASED_OUTPATIENT_CLINIC_OR_DEPARTMENT_OTHER): Admitting: Physical Therapy

## 2024-07-07 ENCOUNTER — Encounter (HOSPITAL_COMMUNITY): Payer: Self-pay | Admitting: Licensed Clinical Social Worker

## 2024-07-07 DIAGNOSIS — F319 Bipolar disorder, unspecified: Secondary | ICD-10-CM

## 2024-07-07 NOTE — Progress Notes (Signed)
 Virtual virtual Video Note  I connected with Todrick Siedschlag Catanese on 07/07/2024 at  2--:3pm EST by video-enabled virtual visit. I verified that I am speaking with the correct person using  two identifiers.I discussed the limitations of evaluation and management by telemedicine and the availability of in person appointments. The patient expressed understanding and agreed to proceed.    LOCATION: Patient: Home  Provider: Home office   History of Present Illness:  Pt was referred by Dr. Arfeen for OP therapy for bipolar disorder and anxiety.  Treatment Goal Addressed:  Pt will meet with clinician weekly for therapy to monitor for progress towards goals and address any barriers to success; Reduce depression from average severity level of 6/10 down to a 4/10 in next 6 months by engaging in 1-2 positive coping skills daily as part of developing self-care routine; Reduce average anxiety level from 7/10 down to 5/10 in next 6 months by utilizing 1-2 relaxation skills/grounding skills per day, such as mindful breathing, progressive muscle relaxation, positive visualizations.  Progress towards Treatment Goal: Progressing   Observations/Objective: Patient presented for today's session on time and was alert, oriented x5, with no evidence or self-report of SI/HI or A/V H.  Patient reported ongoing compliance with medication and denied any use of alcohol  or illicit substances. Clinician inquired about patient's current emotional ratings, as well as any significant changes in thoughts, feelings or behavior since previous session. Patient reported scores of  7/10 for depression, 7/10 for anxiety, 3/10 for anger/irritability. [name] and pt explored his emotional ratings, and discussed coping skills for his stressors. Pt identifies stressors and  allowed pt to explore and express thoughts and feelings associated with recent life situations and external stressors. Pt reports, I had shoulder reversal surgery last week and  slowly mending. I'm still in the same amount of pain as I was post surgery. I'm having to maneuver mostly on my own with little help from my family. [name] asked open ended questions. I still have the cancer in my prostate which will have to be addressed soon.[name] and pt reviewed his continued stressors: marital issues, unfair division of martial finances, son's mental health, physical health, future, pt's sexual health after prostate cancer, lack of marital communication,recovering from shoulder replacement surgery. [name] asked open ended questions. Gain, cln used CBT to assist pt in staying positive, focusing on his current surger, recovery and then focusing on the return of his prostate cancer.        Assessment and plan: Counselor will continue to meet with patient to address treatment plan goals. Patient will continue to follow recommendations of providers and imnd implement skills learned in session, and practice between sessions. Diagnosis: Bipolar 1 disorder.    Collaboration of care:   Continue working with providers    Patient/Guardian was advised Release of Information must be obtained prior to any record release in order to collaborate their care with an outside provider. Patient/Guardian was advised if they have not already done so to contact the registration department to sign all necessary forms in order for us  to release information regarding their care.    Consent: Patient/Guardian gives verbal consent for treatment and assignment of benefits for services provided during this visit. Patient/Guardian expressed understanding and agreed to proceed.     Follow Up Instructions:  I discussed the assessment and treatment plan with the patient. The patient was provided an opportunity to ask questions and all were answered. The patient agreed with the plan and demonstrated an understanding of  the instructions.   The patient was advised to call back or seek an in-person evaluation if  the symptoms worsen or if the condition fails to improve as anticipated.  I provided 60 minutes of non-face-to-face time during this encounter.   Shaton Lore S, LCSW 07/07/24

## 2024-07-13 ENCOUNTER — Encounter (HOSPITAL_COMMUNITY)

## 2024-07-13 ENCOUNTER — Ambulatory Visit (INDEPENDENT_AMBULATORY_CARE_PROVIDER_SITE_OTHER): Admitting: Licensed Clinical Social Worker

## 2024-07-13 DIAGNOSIS — F319 Bipolar disorder, unspecified: Secondary | ICD-10-CM | POA: Diagnosis not present

## 2024-07-14 ENCOUNTER — Encounter (HOSPITAL_BASED_OUTPATIENT_CLINIC_OR_DEPARTMENT_OTHER): Admitting: Physical Therapy

## 2024-07-14 DIAGNOSIS — Z96611 Presence of right artificial shoulder joint: Secondary | ICD-10-CM | POA: Diagnosis not present

## 2024-07-15 ENCOUNTER — Telehealth (HOSPITAL_COMMUNITY): Admitting: Psychiatry

## 2024-07-18 ENCOUNTER — Encounter (HOSPITAL_COMMUNITY): Payer: Self-pay | Admitting: Licensed Clinical Social Worker

## 2024-07-18 NOTE — Progress Notes (Signed)
 Virtual virtual Video Note  I connected with Alex [name] on 07/13/2024 at  2--:3pm EST by video-enabled virtual visit. I verified that I am speaking with the correct person using  two identifiers.I discussed the limitations of evaluation and management by telemedicine and the availability of in person appointments. The patient expressed understanding and agreed to proceed.    LOCATION: Patient: Home  Provider: Home office   History of Present Illness:  Pt was referred by Dr. Arfeen for OP therapy for bipolar disorder and anxiety.  Treatment Goal Addressed:  Pt will meet with clinician weekly for therapy to monitor for progress towards goals and address any barriers to success; Reduce depression from average severity level of 6/10 down to a 4/10 in next 6 months by engaging in 1-2 positive coping skills daily as part of developing self-care routine; Reduce average anxiety level from 7/10 down to 5/10 in next 6 months by utilizing 1-2 relaxation skills/grounding skills per day, such as mindful breathing, progressive muscle relaxation, positive visualizations.  Progress towards Treatment Goal: Progressing   Observations/Objective: Patient presented for today's session on time and was alert, oriented x5, with no evidence or self-report of SI/HI or A/V H.  Patient reported ongoing compliance with medication and denied any use of alcohol  or illicit substances. Clinician inquired about patient's current emotional ratings, as well as any significant changes in thoughts, feelings or behavior since previous session. Patient reported scores of  7/10 for depression, 7/10 for anxiety, 3/10 for anger/irritability. [name] and pt explored his emotional ratings, and discussed coping skills for his stressors. Pt identifies stressors and  allowed pt to explore and express thoughts and feelings associated with recent life situations and external stressors. Pt reports, I continue healing from shoulder reversal surgery  and slowly mending. I'm still in the same amount of pain as I was post surgery. I continue having to maneuver mostly on my own with little help from my family. [name] asked open ended questions. I sam going to address the return of the prostate cancer after I'm healed from should surgery.Alex [name] and pt reviewed his continued stressors: marital issues, unfair division of martial finances, son's mental health, physical health, future, pt's sexual health after prostate cancer,new prostate cancer, should surgery, lack of marital communication,recovering from shoulder replacement surgery. [name] asked open ended questions. Again,, cln used CBT to assist pt in staying positive, focusing on his current surgery recovery and then focusing on the return of his prostate cancer. Pt discussed his Thanksgiving holiday and he discussed all the things he is grateful for.        Assessment and plan: Counselor will continue to meet with patient to address treatment plan goals. Patient will continue to follow recommendations of providers and imnd implement skills learned in session, and practice between sessions. Diagnosis: Bipolar 1 disorder.    Collaboration of care:   Continue working with providers    Patient/Guardian was advised Release of Information must be obtained prior to any record release in order to collaborate their care with an outside provider. Patient/Guardian was advised if they have not already done so to contact the registration department to sign all necessary forms in order for us  to release information regarding their care.    Consent: Patient/Guardian gives verbal consent for treatment and assignment of benefits for services provided during this visit. Patient/Guardian expressed understanding and agreed to proceed.     Follow Up Instructions:  I discussed the assessment and treatment plan with the patient. The patient  was provided an opportunity to ask questions and all were answered. The  patient agreed with the plan and demonstrated an understanding of the instructions.   The patient was advised to call back or seek an in-person evaluation if the symptoms worsen or if the condition fails to improve as anticipated.  I provided 60 minutes of non-face-to-face time during this encounter.   Alex [name] S, LCAS-A 07/13/24

## 2024-07-19 DIAGNOSIS — H35351 Cystoid macular degeneration, right eye: Secondary | ICD-10-CM | POA: Diagnosis not present

## 2024-07-20 ENCOUNTER — Ambulatory Visit (INDEPENDENT_AMBULATORY_CARE_PROVIDER_SITE_OTHER): Admitting: Licensed Clinical Social Worker

## 2024-07-20 ENCOUNTER — Encounter (HOSPITAL_COMMUNITY): Payer: Self-pay | Admitting: Licensed Clinical Social Worker

## 2024-07-20 DIAGNOSIS — F319 Bipolar disorder, unspecified: Secondary | ICD-10-CM | POA: Diagnosis not present

## 2024-07-20 NOTE — Progress Notes (Signed)
 Virtual virtual Video Note  I connected with Alex [name] on 07/13/9024 at  2--:3pm EST by video-enabled virtual visit. I verified that I am speaking with the correct person using  two identifiers.I discussed the limitations of evaluation and management by telemedicine and the availability of in person appointments. The patient expressed understanding and agreed to proceed.    LOCATION: Patient: Home  Provider: Home office   History of Present Illness:  Pt was referred by Dr. Arfeen for OP therapy for bipolar disorder and anxiety.  Treatment Goal Addressed:  Pt will meet with clinician weekly for therapy to monitor for progress towards goals and address any barriers to success; Reduce depression from average severity level of 6/10 down to a 4/10 in next 6 months by engaging in 1-2 positive coping skills daily as part of developing self-care routine; Reduce average anxiety level from 7/10 down to 5/10 in next 6 months by utilizing 1-2 relaxation skills/grounding skills per day, such as mindful breathing, progressive muscle relaxation, positive visualizations.  Progress towards Treatment Goal: Progressing   Observations/Objective: Patient presented for today's session on time and was alert, oriented x5, with no evidence or self-report of SI/HI or A/V H.  Patient reported ongoing compliance with medication and denied any use of alcohol  or illicit substances. Clinician inquired about patient's current emotional ratings, as well as any significant changes in thoughts, feelings or behavior since previous session. Patient reported scores of  7/10 for depression, 7/10 for anxiety, 3/10 for anger/irritability. [name] and pt explored his emotional ratings, and discussed coping skills for his stressors. Pt identifies stressors and  allowed pt to explore and express thoughts and feelings associated with recent life situations and external stressors. Pt reports, I continue healing from shoulder reversal surgery  and slowly mending. I'm still in the same amount of pain as I was post surgery. I continue having to maneuver mostly on my own with little help from my family. [name] asked open ended questions. I sam going to address the return of the prostate cancer after I'm healed from should surgery. I have an appt with prostate dr in January. Pt discussed his fears and concerns of the new cancer detection.[name] and pt reviewed his continued stressors: marital issues, unfair division of martial finances, son's mental health, physical health, future, pt's sexual health after prostate cancer,new prostate cancer, should surgery, lack of marital communication,recovering from shoulder replacement surgery. [name] asked open ended questions. Again,, cln used CBT to assist pt in staying positive, focusing on his current surgery recovery and then focusing on the return of his prostate cancer.       Assessment and plan: Counselor will continue to meet with patient to address treatment plan goals. Patient will continue to follow recommendations of providers and imnd implement skills learned in session, and practice between sessions. Diagnosis: Bipolar 1 disorder.    Collaboration of care:   Continue working with providers    Patient/Guardian was advised Release of Information must be obtained prior to any record release in order to collaborate their care with an outside provider. Patient/Guardian was advised if they have not already done so to contact the registration department to sign all necessary forms in order for us  to release information regarding their care.    Consent: Patient/Guardian gives verbal consent for treatment and assignment of benefits for services provided during this visit. Patient/Guardian expressed understanding and agreed to proceed.     Follow Up Instructions:  I discussed the assessment and treatment plan with  the patient. The patient was provided an opportunity to ask questions and all were  answered. The patient agreed with the plan and demonstrated an understanding of the instructions.   The patient was advised to call back or seek an in-person evaluation if the symptoms worsen or if the condition fails to improve as anticipated.  I provided 60 minutes of non-face-to-face time during this encounter.   Kerin Kren S, LCAS-A 07/20/24

## 2024-07-21 ENCOUNTER — Encounter (HOSPITAL_BASED_OUTPATIENT_CLINIC_OR_DEPARTMENT_OTHER): Admitting: Physical Therapy

## 2024-07-27 ENCOUNTER — Ambulatory Visit (HOSPITAL_COMMUNITY): Admitting: Licensed Clinical Social Worker

## 2024-07-27 DIAGNOSIS — F319 Bipolar disorder, unspecified: Secondary | ICD-10-CM | POA: Diagnosis not present

## 2024-07-28 ENCOUNTER — Encounter (HOSPITAL_BASED_OUTPATIENT_CLINIC_OR_DEPARTMENT_OTHER): Admitting: Physical Therapy

## 2024-07-30 ENCOUNTER — Ambulatory Visit (HOSPITAL_BASED_OUTPATIENT_CLINIC_OR_DEPARTMENT_OTHER): Admitting: Physical Therapy

## 2024-07-30 ENCOUNTER — Other Ambulatory Visit: Payer: Self-pay

## 2024-07-30 ENCOUNTER — Encounter (HOSPITAL_BASED_OUTPATIENT_CLINIC_OR_DEPARTMENT_OTHER): Payer: Self-pay | Admitting: Physical Therapy

## 2024-07-30 DIAGNOSIS — R600 Localized edema: Secondary | ICD-10-CM | POA: Insufficient documentation

## 2024-07-30 DIAGNOSIS — M25511 Pain in right shoulder: Secondary | ICD-10-CM | POA: Insufficient documentation

## 2024-07-30 DIAGNOSIS — M25611 Stiffness of right shoulder, not elsewhere classified: Secondary | ICD-10-CM | POA: Insufficient documentation

## 2024-07-30 DIAGNOSIS — M6281 Muscle weakness (generalized): Secondary | ICD-10-CM | POA: Diagnosis not present

## 2024-07-30 DIAGNOSIS — R208 Other disturbances of skin sensation: Secondary | ICD-10-CM | POA: Diagnosis not present

## 2024-07-30 DIAGNOSIS — R293 Abnormal posture: Secondary | ICD-10-CM | POA: Insufficient documentation

## 2024-07-30 NOTE — Therapy (Signed)
 " OUTPATIENT PHYSICAL THERAPY SHOULDER EVALUATION   Patient Name: Alex [name] MRN: [id number] DOB:[birth year], 68 y.o., male Today's Date: 07/30/2024  END OF SESSION:  PT End of Session - 07/30/24 1318     Visit Number 1    Number of Visits 25    Date for Recertification  10/22/24    Authorization Type [employer]    PT Start Time 1150    PT Stop Time 1228    PT Time Calculation (min) 38 min    Activity Tolerance Patient tolerated treatment well    Behavior During Therapy [facility] for tasks assessed/performed          Past Medical History:  Diagnosis Date   Anemia    Anxiety    Atypical nevus 04/12/1997   dyplastic-left chest below nipple   Atypical nevus 01/18/2005   slight-mod-mid upper abd, slight-mod-right lateral chest-(WS), slight-mod-mid lower back (punch)   Atypical nevus 05/31/2005   dysplastic-central lowerback (exc), dysplastic- right abdomen (Exc)   Basal cell carcinoma 06/04/2016   back of neck   Bipolar I disorder (HCC)    Bleeding ulcer 2016   BPH (benign prostatic hypertrophy) with urinary obstruction    Cancer (HCC)    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 Maude Halt, oncology. Receives monthly IVIG 40 grams.   Depression    Epicondylitis    right elbow   GERD (gastroesophageal reflux disease)    Glaucoma BOTH EYES   RIGHT EYE RADIATION DAMAGE   Hearing loss    Bilateral   Hepatic cyst    Several, The lesion of concern in segment 6 of the liver  has single large portal vein and hepatic vein branches extending to tt, in a pattern of enhancement which mirrors these vascular structures. 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 9 CM X 4. 3 CM PER NOV 06-14-17  CHEST CTFOLOWED BY DR PIETRO YEARLY FOR   Positional vertigo  HX OF WITH SINUS INFECTIONS   Prostate cancer [facility])    Follows w/ Dr. Debby Polascik @ [facility].   Radial head fracture    Right   Squamous cell carcinoma of skin 06/04/2016   in situ-crown of scalp   Squamous cell carcinoma of skin 04/22/2017   in situ-crown scalp (txpbx)   Thoracic aortic aneurysm 06/14/2017   last CT 4.1 CM Mild   Tinnitus    CONSTANT   Ulnar nerve compression    right elbow   Unsteady gait    especially with stairs, depth perception off   Urinary hesitancy    Wears glasses    Past Surgical History:  Procedure Laterality Date   CARDIAC CATHETERIZATION  01-16-2006  DR DEBBY WALL   MILD CORONARY ATHEROSCLEROSIS/ MID TO DISTAL LAD 40% STENOSIS/ LVF 50-55%   CARPAL TUNNEL RELEASE Right 11/03/2017   Procedure: RIGHT HAND CARPAL TUNNEL RELEASE;  Surgeon: Shari Easter, MD;   Location: [facility] [place];  Service: Orthopedics;  Laterality: Right;   CATARACT EXTRACTION Right    COLONSCOPY  2017 LAST DONE   MULTIPLE   CYST EXCISION Right 09/04/2023   Procedure: EXCISION CYST RIGHT SHOULDER;  Surgeon: Eletha Boas, MD;  Location: [facility] [place];  Service: General;  Laterality: Right;  LOCAL & MAC   ENDOSCOPY  LAST 2017   MULTIPLE DONE DILATION DONE ALSO   ESOPHAGOGASTRODUODENOSCOPY (EGD) WITH PROPOFOL  N/A 02/24/2018   Procedure: ESOPHAGOGASTRODUODENOSCOPY (EGD) WITH PROPOFOL ;  Surgeon: Celestia Agent, MD;  Location: [facility];  Service: Endoscopy;  Laterality: N/A;   EXCISION RADIAL HEAD Right 11/03/2017   Procedure: RIGHT PROXIMAL RADIUS RADIAL HEAD RESECTION AND JOINT DEBRIDEMENT;  Surgeon: Shari Easter, MD;  Location: [facility] [place];  Service: Orthopedics;  Laterality: Right;   EXTRACORPOREAL SHOCK WAVE LITHOTRIPSY Right 11/20/2020   Procedure: EXTRACORPOREAL SHOCK WAVE LITHOTRIPSY (ESWL);  Surgeon: Alvaro Hummer, MD;  Location: [facility];  Service: Urology;  Laterality: Right;  75 MINS   KNEE ARTHROSCOPY  05/01/2012   Procedure: ARTHROSCOPY KNEE;  Surgeon: Reyes JAYSON Billing, MD;  Location: [facility];  Service: Orthopedics;  Laterality: Left;  debridement and removal of loose body   LEFT ANKLE ARTHROSCOPY W/ DEBRIDEMENT  05/12/2007   LEFT HYDROCELECTOMY  03/29/2005   AND REPAIR LEFT INGUINAL HERNIA W/ MESH   MOHS SURGERY  2002   RIGHT ORBITAL CANCER   NASAL ENDOSCOPY  08/07/2005   RIGHT EPISTAXIS  / POST SEPTOPLASTY  (HX RIGHT ORBITAL CA & S/P RADIATION/ NECROSIS ANTERIOR END OF BOTH INFERIOR TURBINATES)   occuloplastic surgery  2002   PARS PLANA VITRECTOMY  11/06/2004   RIGHT EYE RADIATION RETINOPATHY W/ HEMORRHAGE   PROSTATE ABLATION N/A 06/2022   RADIAL HEAD ARTHROPLASTY Right 06/15/2018   Procedure: RIGHT ELBOW PROXIMAL RADIOULNAR JOINT DEBRIDEMENT AND ARTHROPLASTY;  Surgeon: Shari Easter, MD;  Location: [facility];  Service: Orthopedics;  Laterality: Right;  BLOCK WITH SEDATION   REPAIR UNDESENDED RIGHT TESTICLE / RIGHT INGUINAL HERNIA  AGE 43   REVERSE SHOULDER ARTHROPLASTY Right 07/01/2024   Procedure: ARTHROPLASTY, SHOULDER, TOTAL, REVERSE;  Surgeon: Melita Drivers, MD;  Location: [facility];  Service: Orthopedics;  Laterality: Right;    RIGHT ANKLE ARTHROSCOPY W/ EXTENSIVE DEBRIDEMENT  04/05/2008   x2   RIGHT SHOULDER SURGERY  2006   RIGHT SUPRAOMOHYOID NECK DISSECTION   03/08/2003   ZONES 1,2,3;   SUBMANDIBULAR MASS / METASTATIC SQUAMOUS CELL CARCINOMA RIGHT NECK   SAVORY DILATION  N/A 02/24/2018   Procedure: SAVORY DILATION;  Surgeon: Celestia Agent, MD;  Location: [facility];  Service: Endoscopy;  Laterality: N/A;   SEPTOPLASTY  06/2005   SHOULDER ARTHROSCOPY Left    SHOULDER ARTHROSCOPY W/ SUBACROMIAL DECOMPRESSION AND DISTAL CLAVICLE EXCISION  10/09/2008   AND DEBRIDEMENT OF RIGHT SHOULDER IMPINGEMENT & AC JOINT ARTHRITIS   SPINE SURGERY  2016   l 3 TO l 4 PLATE AND SCREWS   TOTAL THYROIDECTOMY  11/03/2001   PAPILLARY THYROID  CARCINOMA   TRANSTHORACIC ECHOCARDIOGRAM  07/2011   grade I diastolic dysfunction/ ef 55-60%   ULNAR NERVE TRANSPOSITION Right 04/28/2014   Procedure: RIGHT ELBOW ULNA NERVE RELEASE TRANSPOSTION AND MEDIAL EPICONDYLAR DEBRIDEMENT AND REPAIR;  Surgeon: Prentice LELON Pagan, MD;  Location: [facility];  Service: Orthopedics;  Laterality: Right;   Patient Active Problem List   Diagnosis Date Noted   Shoulder arthritis 06/23/2024   Bladder neck obstruction 06/23/2024   Esophageal spasm 04/01/2024   GERD (gastroesophageal reflux disease) 04/01/2024   Memory changes 04/01/2024   Primary osteoarthritis of both knees 09/22/2023   Boil 08/26/2023   Abscess 08/20/2023   Cellulitis 05/12/2023   Bruise of lower lip 04/17/2023   Wheezing 03/31/2023   Injury of medial collateral ligament (MCL) of knee 02/07/2023    Sprain of medial collateral ligament of right knee 02/07/2023   Osteoarthritis of subtalar joint 09/25/2022   Cystoid macular edema of right eye 07/18/2022   Hyperglycemia 07/18/2022   Nonexudative age-related macular degeneration, left eye, early dry stage 07/18/2022   Retinal edema 07/18/2022   Superficial punctate keratitis of right eye 07/18/2022   Grief at loss of child 05/22/2022   Actinic keratoses 05/22/2022   Pain in joint of left elbow 04/04/2022   Abnormal defecation 02/26/2022   Atrophic gastritis 02/26/2022   Bipolar 1 disorder (HCC) 02/26/2022   BPH (benign prostatic hyperplasia) 02/26/2022   Bronchiolectasis (HCC) 02/26/2022   Disease of thyroid  gland 02/26/2022   Diverticular disease of colon 02/26/2022   Family history of malignant neoplasm of digestive organs 02/26/2022   Hyperlipidemia 02/26/2022   IgG deficiency (HCC) 02/26/2022   Immune deficiency disorder 02/26/2022   Irritable bowel syndrome 02/26/2022   Pancreatic cyst 02/26/2022   History of colonic polyps 02/26/2022   Portosystemic shunt, 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/26/2022   Pyloric ulcer 02/26/2022   Scoliosis 02/26/2022   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   Spinal stenosis of lumbar region 02/26/2022   Prostate cancer (HCC) 02/26/2022   Dysuria 10/03/2021   Earache 06/14/2021   Anemia 06/14/2021   History of head and neck cancer 03/28/2021   Swelling of right parotid gland 03/28/2021   Epistaxis, recurrent 06/26/2020   PMR (polymyalgia rheumatica) 02/22/2020   Diverticulitis 01/25/2020   Abdominal pain 01/25/2020   Iliac vessel injury 11/30/2019   Stress at home 09/14/2019   LLQ abdominal pain 09/06/2019   IPMN (intraductal papillary mucinous neoplasm) 07/19/2019   History of thyroid  cancer 06/03/2019   Abnormal findings on diagnostic imaging of liver 06/03/2019   Epididymitis 03/16/2019   Difficulty urinating 03/16/2019   Pain in right foot 03/12/2019    Hypothyroidism (acquired) 09/29/2018   Moderate persistent asthma with acute exacerbation 09/29/2018   Prediabetes 09/29/2018   RTI (respiratory tract infection) 09/29/2018   ETD (Eustachian tube dysfunction), bilateral 09/02/2018   Sensorineural hearing loss (SNHL) of both ears 07/31/2018   Bruising 11/17/2017   Encounter for other orthopedic aftercare 11/14/2017   Cicatricial lagophthalmos of left lower eyelid  10/31/2017   Closed fracture of head of right radius 10/24/2017   Palatal mass 09/16/2017   Sore throat 09/16/2017   Testicular pain, right 08/01/2017   Combined form of age-related cataract, left eye 05/19/2017   Dry eye syndrome of both eyes 05/19/2017   Primary open angle glaucoma (POAG) of left eye, mild stage 05/19/2017   Radiation retinopathy, 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   Secondary glaucoma due to combination mechanisms, right, severe stage 05/19/2017   Fatigue 01/21/2017   Ankle pain, left 01/21/2017   Fall (on) (from) other stairs and steps, initial encounter 11/11/2016   Abrasion of head 11/11/2016   Ascending aortic aneurysm 07/23/2016   Erectile dysfunction 07/19/2016   Bronchiectasis (HCC) 04/23/2016   Chronic bronchitis (HCC) 03/11/2016   Cerumen impaction 08/04/2015   Bilateral impacted cerumen 08/04/2015   Cervical disc disorder with radiculopathy of cervical region 06/06/2014   Elevated WBC count 06/06/2014   Iron  deficiency anemia 06/06/2014   Edema 02/21/2014   Tinnitus 02/21/2014   Well adult exam 05/31/2013   Glaucoma 05/31/2013   RML pneumonia 03/31/2013   Hypertension, uncontrolled 02/25/2013   Anxiety disorder 08/08/2010   Chest pain, atypical 02/08/2010   ESOPHAGEAL STRICTURE 11/30/2009   Osteoarthritis 08/07/2009   Malaise and fatigue 05/14/2008   Coronary atherosclerosis 11/04/2007   Lumbago 11/04/2007   THYROIDECTOMY, HX OF 11/04/2007   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    PCP: Garald Scrape MD   REFERRING PROVIDER:  Melita Drivers MD   REFERRING DIAG: Diagnosis Z96.611 (ICD-10-CM) - Presence of right artificial shoulder joint  THERAPY DIAG:  Acute pain of right shoulder  Stiffness of right shoulder, not elsewhere classified  Muscle weakness (generalized)  Abnormal posture  Localized edema  Other disturbances of skin sensation  Rationale for Evaluation and Treatment: Rehabilitation  ONSET DATE: Reverse TSA 07/01/24  SUBJECTIVE:                                                                                                                                                                                      SUBJECTIVE STATEMENT:  I've been having numbness and tingling down one side of my forearm, saw the doctor the other day and he feels it is from swelling from surgery and should get better. Things are going OK otherwise. Have some tender spots that light me up if I press on them. MD cleared for me DVT in the arm given swelling.   Hand dominance: Right  PERTINENT HISTORY: See above   PAIN:  Are you having pain? Yes: NPRS scale: 5/10 Pain location: R shoulder Pain description: sharp, numbness/tingling going down into forearm  Aggravating factors: IR of UE, reaching for things,  pulling covers over himself  Relieving factors: medicine and ice   PRECAUTIONS: Shoulder and Other: poor vision R eye, active CA found recently but not sure if he will go back to chemo or radiation yet   RED FLAGS: None   WEIGHT BEARING RESTRICTIONS: No  FALLS:  Has patient fallen in last 6 months? Yes. Number of falls 1- fainted at breakfast table   LIVING ENVIRONMENT: Lives with: lives with their family Lives in: House/apartment   OCCUPATION: On disability   PLOF: Independent, Independent with basic ADLs, Independent with gait, and Independent with transfers  PATIENT GOALS:get full use of shoulder and address pain  NEXT MD VISIT:   OBJECTIVE:  Note: Objective measures were completed at  Evaluation unless otherwise noted.   PATIENT SURVEYS:  PSFS: THE PATIENT SPECIFIC FUNCTIONAL SCALE  Place score of 0-10 (0 = unable to perform activity and 10 = able to perform activity at the same level as before injury or problem)  Activity Date: 07/30/24 Eval    Dressing (tucking in shirt, buckling belt, etc) 3    2. Reaching behind the back  2    3. Reaching forward for something  4    4.      Total Score 3      Total Score = Sum of activity scores/number of activities  Minimally Detectable Change: 3 points (for single activity); 2 points (for average score)  [facility] (nd). The Patient Specific Functional Scale . Retrieved from [email or web address]   COGNITION: Overall cognitive status: Within functional limits for tasks assessed     SENSATION: Numbness medial forearm   POSTURE: Rounded shoulders, forward head   UPPER EXTREMITY ROM:    ROM Right eval  Shoulder flexion 110* PROM, AAROM 140*  Shoulder extension   Shoulder abduction 90* PROM  Shoulder adduction   Shoulder internal rotation   Shoulder external rotation 30* PROM directly by his side   Elbow flexion   Elbow extension   Wrist flexion   Wrist extension   Wrist ulnar deviation   Wrist radial deviation   Wrist pronation   Wrist supination   (Blank rows = not tested)  UPPER EXTREMITY MMT:  MMT Right eval Left eval  Shoulder flexion    Shoulder extension    Shoulder abduction    Shoulder adduction    Shoulder internal rotation    Shoulder external rotation    Middle trapezius    Lower trapezius    Elbow flexion    Elbow extension    Wrist flexion    Wrist extension    Wrist ulnar deviation    Wrist radial deviation    Wrist pronation    Wrist supination    Grip strength (lbs)    (Blank rows = not tested)  MMT not done at eval  TREATMENT DATE:   07/30/24  Eval, POC, HEP   HEP practice and discussion     PATIENT EDUCATION: Education details: exam findings, POC, HEP, general course of care and expectations for rehab for reverse TSA, wrist exercises to be performed in elevation to help with edema  Person educated: Patient Education method: Explanation, Demonstration, and Handouts Education comprehension: verbalized understanding, returned demonstration, and needs further education  HOME EXERCISE PROGRAM: Access Code: [email or web address] URL: [email or web address] Date: 07/30/2024 Prepared by: Josette Rough  Exercises - Supine Shoulder Flexion Extension AAROM with Dowel  - 2-3 x daily - 7 x weekly - 1 sets - 10 reps - Seated Shoulder External Rotation AAROM with Dowel  - 2-3 x daily - 7 x weekly - 1 sets - 10 reps - Seated Scapular Retraction  - 2-3 x daily - 7 x weekly - 1 sets - 10 reps - 2 seconds  hold - Seated Backward Shoulder Rolls  - 2-3 x daily - 7 x weekly - 1 sets - 10 reps - Seated Elbow Flexion and Extension AROM  - 2-3 x daily - 7 x weekly - 1 sets - 10 reps - Wrist AROM Flexion Extension  - 2-3 x daily - 7 x weekly - 1 sets - 10 reps  ASSESSMENT:  CLINICAL IMPRESSION: Patient is a 68 y.o. M who was seen today for physical therapy evaluation and treatment for skilled PT care following reverse TSA done 07/01/24, also long head of biceps was tenodesed during this procedure per MD op note. Incision looks to be healing well and shoulder looks great overall after his procedure. He does have some forearm edema along with medial forearm and 5th digit tingling- MD with minimal concern for DVT per pt. I do think that numbness/tingling is likely coming from pressure on ulnar nerve from edema, however he does have hx of 3 elbow surgeries- we will continue to monitor and tx as appropriate, hopefully this sx will improve along with edema. Will make every effort to  address functional/objective impairments and his subjective concerns moving forward.   OBJECTIVE IMPAIRMENTS: decreased ROM, decreased strength, hypomobility, increased edema, increased fascial restrictions, increased muscle spasms, impaired flexibility, impaired sensation, impaired UE functional use, improper body mechanics, postural dysfunction, and pain.   ACTIVITY LIMITATIONS: carrying, lifting, bathing, toileting, dressing, self feeding, reach over head, hygiene/grooming, and caring for others  PARTICIPATION LIMITATIONS: meal prep, cleaning, laundry, driving, shopping, community activity, and yard work  PERSONAL FACTORS: Age, Behavior pattern, Fitness, Past/current experiences, Social background, and Time since onset of injury/illness/exacerbation are also affecting patient's functional outcome.   REHAB POTENTIAL: Good  CLINICAL DECISION MAKING: Evolving/moderate complexity  EVALUATION COMPLEXITY: Moderate   GOALS: Goals reviewed with patient? No  SHORT TERM GOALS: Target date: 09/10/2024    Patient will be compliant with appropriate progressive HEP        GOAL STATUS: Initial  2. R shoulder flexion and ABD A/PROM to be at least 160*         GOAL STATUS: Initial    3. R shoulder ER A/PROM to be at least 60*, will be able to reach L5 FIR level functionally           GOAL STATUS: Initial    4. Will be more aware of posture with all functional tasks with use of ergonomic aides PRN/as desired      GOAL STATUS: Initial   5. Edema and sensory impairments in R UE to have resolved GOAL STATUS: Initial  LONG TERM GOALS: Target date: 10/22/2024    MMT to have improved by at least one grade in all weak groups       GOAL STATUS: Initial    2. AROM to have normalized and will be pain free all planes of motion      GOAL STATUS: Initial    3. Pain to be no more than 2/10 with all functional tasks     GOAL STATUS: Initial   4. Will be able to perform all functional  household and work based tasks without increase from resting pain levels     GOAL STATUS: Initial   5. PSFS to have improved by at least 3 points to show improved QOL and subjective perception of condition      GOAL STATUS: Initial   6. Will be able to perform all dressing activities, including doing belt and tucking his shirt in behind him without difficulty  GOAL STATUS: Initial   PLAN:  PT FREQUENCY: 2x/week  PT DURATION: 12 weeks  PLANNED INTERVENTIONS: [id number]- PT Re-evaluation, 97750- Physical Performance Testing, 97110-Therapeutic exercises, 97530- Therapeutic activity, [id number]- Neuromuscular re-education, 97535- Self Care, [id number]- Manual therapy, [id number]- Aquatic Therapy, [id number]- Electrical stimulation (unattended), 97016- Vasopneumatic device, [id number]- Ultrasound, and 97033- Ionotophoresis 4mg /ml Dexamethasone   PLAN FOR NEXT SESSION: per reverse TSA protocol (from Dr. Melita, did not have formal protocol so used [facility] protocol below); keep in mind long head of biceps was tenodesed   [email or web address]  Josette Rough, PT, DPT 07/30/2024 1:20 PM  "

## 2024-08-03 ENCOUNTER — Encounter (HOSPITAL_COMMUNITY): Payer: Self-pay | Admitting: Licensed Clinical Social Worker

## 2024-08-03 ENCOUNTER — Encounter (HOSPITAL_BASED_OUTPATIENT_CLINIC_OR_DEPARTMENT_OTHER): Admitting: Physical Therapy

## 2024-08-03 ENCOUNTER — Ambulatory Visit (HOSPITAL_COMMUNITY): Admitting: Licensed Clinical Social Worker

## 2024-08-03 NOTE — Progress Notes (Signed)
 Virtual virtual Video Note  I connected with Alex [name] on 12/23/025 at  2--:3pm EST by video-enabled virtual visit. I verified that I am speaking with the correct person using  two identifiers.I discussed the limitations of evaluation and management by telemedicine and the availability of in person appointments. The patient expressed understanding and agreed to proceed.    LOCATION: Patient: Home  Provider: Home office   History of Present Illness:  Pt was referred by Dr. Arfeen for OP therapy for bipolar disorder and anxiety.  Treatment Goal Addressed:  Pt will meet with clinician weekly for therapy to monitor for progress towards goals and address any barriers to success; Reduce depression from average severity level of 6/10 down to a 4/10 in next 6 months by engaging in 1-2 positive coping skills daily as part of developing self-care routine; Reduce average anxiety level from 7/10 down to 5/10 in next 6 months by utilizing 1-2 relaxation skills/grounding skills per day, such as mindful breathing, progressive muscle relaxation, positive visualizations.  Progress towards Treatment Goal: Progressing   Observations/Objective: Patient presented for todays session on time and was alert, oriented x5, with no evidence or self-report of SI/HI or A/V H.  Patient reported ongoing compliance with medication and denied any use of alcohol  or illicit substances. Clinician inquired about patients current emotional ratings, as well as any significant changes in thoughts, feelings or behavior since previous session. Patient reported scores of  7/10 for depression, 7/10 for anxiety, 3/10 for anger/irritability. [name] and pt explored his emotional ratings, and discussed coping skills for his stressors. Pt identifies stressors and  allowed pt to explore and express thoughts and feelings associated with recent life situations and external stressors. Pt reports, I continue healing from shoulder reversal surgery  and slowly mending. I'm still in the same amount of pain as I was post surgery. I continue having to maneuver mostly on my own with little help from my family. [name] asked open ended questions. I sam going to address the return of the prostate cancer after I'm healed from should surgery, appt scheduled for January.. Pt discussed his fears and concerns of the new cancer detection.[name] and pt reviewed his continued stressors: marital issues, unfair division of martial finances, son's mental health, physical health, future, pt's sexual health after prostate cancer,new prostate cancer dx, shoulder surgery recovery, lack of marital communication,recovering from shoulder replacement surgery. [name] asked open ended questions. Clinician utilized MI OARS to reflect and summarize thoughts and feelings.    Assessment and plan: Counselor will continue to meet with patient to address treatment plan goals. Patient will continue to follow recommendations of providers and imnd implement skills learned in session, and practice between sessions. Diagnosis: Bipolar 1 disorder.    Collaboration of care:   Continue working with providers    Patient/Guardian was advised Release of Information must be obtained prior to any record release in order to collaborate their care with an outside provider. Patient/Guardian was advised if they have not already done so to contact the registration department to sign all necessary forms in order for us  to release information regarding their care.    Consent: Patient/Guardian gives verbal consent for treatment and assignment of benefits for services provided during this visit. Patient/Guardian expressed understanding and agreed to proceed.     Follow Up Instructions:  I discussed the assessment and treatment plan with the patient. The patient was provided an opportunity to ask questions and all were answered. The patient agreed with the plan  and demonstrated an understanding of the  instructions.   The patient was advised to call back or seek an in-person evaluation if the symptoms worsen or if the condition fails to improve as anticipated.  I provided 60 minutes of non-face-to-face time during this encounter.   Adine Heimann S, LCAS-A 07/27/24

## 2024-08-06 ENCOUNTER — Encounter (HOSPITAL_BASED_OUTPATIENT_CLINIC_OR_DEPARTMENT_OTHER): Admitting: Physical Therapy

## 2024-08-09 ENCOUNTER — Encounter (HOSPITAL_BASED_OUTPATIENT_CLINIC_OR_DEPARTMENT_OTHER): Admitting: Physical Therapy

## 2024-08-10 ENCOUNTER — Other Ambulatory Visit: Payer: Self-pay | Admitting: Internal Medicine

## 2024-08-10 ENCOUNTER — Ambulatory Visit (HOSPITAL_COMMUNITY): Admitting: Licensed Clinical Social Worker

## 2024-08-10 ENCOUNTER — Ambulatory Visit: Payer: Self-pay

## 2024-08-10 ENCOUNTER — Encounter: Payer: Self-pay | Admitting: Hematology & Oncology

## 2024-08-10 NOTE — Telephone Encounter (Signed)
 FYI Only or Action Required?: FYI only for provider: appointment scheduled on 12/31.  Patient was last seen in primary care on 06/23/2024 by Plotnikov, Karlynn GAILS, MD.  Called Nurse Triage reporting URI.  Symptoms began several days ago.  Interventions attempted: Rest, hydration, or home remedies.  Symptoms are: gradually worsening.  Triage Disposition: See PCP When Office is Open (Within 3 Days)  Patient/caregiver understands and will follow disposition?: Yes  Copied from CRM #[id number]. Topic: Clinical - Red Word Triage >> Aug 10, 2024  9:09 AM Jasmin G wrote: Red Word that prompted transfer to Nurse Triage: Pt requested to speak to NT due to experiencing cold like symptoms, accompanied by chest pain. Pt is particularly worried due to having a weak immune system. Reason for Disposition  [1] Chest pain(s) lasting a few seconds from coughing AND [2] persists > 3 days  Answer Assessment - Initial Assessment Questions Patient on day 3 of cough, sore throat, and chest tightness. Referring to it as chest pain but clarifies that it is related to his sore throat and his airway. Denies radiating pain. Does have mild pain at rest due to constant throat clearing.  Productive cough with dark mucous in the morning and green throughout the day. Sore throat that is red and swollen but denies white patches. Sputum is bitter tasting   Denies fever, dizziness, headache, ear pain. Patient endorses mild SOB with coughing. Does have inhaler and will try now to help with the tightness from coughing.   Patient with compromised immune system (prostate cancer) and has family staying with him who just recovered from RSV and Flu A. Wanting to get started on treatment so he isnt compromised further. He placed refill request for Septra .   Appt with [facility] tomorrow morning 12/31 for assessment. Will go to ED/UC for any worsening     1. LOCATION: Where does it hurt?       Hurts in the bronchial area above  the sternum  2. RADIATION: Does the pain go anywhere else? (e.g., into neck, jaw, arms, back)     Denies  3. ONSET: When did the chest pain begin? (Minutes, hours or days)      Sunday morning  4. PATTERN: Does the pain come and go, or has it been constant since it started?  Does it get worse with exertion?      Constant but worse with coughing  5. DURATION: How long does it last (e.g., seconds, minutes, hours)     Constant  6. SEVERITY: How bad is the pain?  (e.g., Scale 1-10; mild, moderate, or severe)     Aching 3/10 and when coughs 7/10  7. CARDIAC RISK FACTORS: Do you have any history of heart problems or risk factors for heart disease? (e.g., angina, prior heart attack; diabetes, high blood pressure, high cholesterol, smoker, or strong family history of heart disease)     CAD, HTN  8. PULMONARY RISK FACTORS: Do you have any history of lung disease?  (e.g., blood clots in lung, asthma, emphysema, birth control pills)     Chronic Bronchitis, asthma  9. CAUSE: What do you think is causing the chest pain?     Sore throat 10. OTHER SYMPTOMS: Do you have any other symptoms? (e.g., dizziness, nausea, vomiting, sweating, fever, difficulty breathing, cough)       Cough, sore throat  Answer Assessment - Initial Assessment Questions 1. ONSET: When did the cough begin?      Sunday morning 2. SEVERITY: How  bad is the cough today?      Has to clear throat  3. SPUTUM: Describe the color of your sputum (e.g., none, dry cough; clear, white, yellow, green)     Green yellow  4. HEMOPTYSIS: Are you coughing up any blood? If Yes, ask: How much? (e.g., flecks, streaks, tablespoons, etc.)     Denies  5. DIFFICULTY BREATHING: Are you having difficulty breathing? If Yes, ask: How bad is it? (e.g., mild, moderate, severe)      Mild  6. FEVER: Do you have a fever? If Yes, ask: What is your temperature, how was it measured, and when did it start?     Denies  7. CARDIAC  HISTORY: Do you have any history of heart disease? (e.g., heart attack, congestive heart failure)      HTN, CAD 8. LUNG HISTORY: Do you have any history of lung disease?  (e.g., pulmonary embolus, asthma, emphysema)     Asthma, chronic bronchitis 9. PE RISK FACTORS: Do you have a history of blood clots? (or: recent major surgery, recent prolonged travel, bedridden)     denies 10. OTHER SYMPTOMS: Do you have any other symptoms? (e.g., runny nose, wheezing, chest pain)       Sore throat  Protocols used: Chest Pain-A-AH, Cough - Acute Productive-A-AH

## 2024-08-11 ENCOUNTER — Encounter: Payer: Self-pay | Admitting: Family Medicine

## 2024-08-11 ENCOUNTER — Encounter (HOSPITAL_BASED_OUTPATIENT_CLINIC_OR_DEPARTMENT_OTHER): Admitting: Physical Therapy

## 2024-08-11 ENCOUNTER — Ambulatory Visit (INDEPENDENT_AMBULATORY_CARE_PROVIDER_SITE_OTHER): Admitting: Family Medicine

## 2024-08-11 VITALS — BP 140/88 | HR 74 | Temp 97.6°F | Wt 198.9 lb

## 2024-08-11 DIAGNOSIS — R058 Other specified cough: Secondary | ICD-10-CM

## 2024-08-11 DIAGNOSIS — Z5189 Encounter for other specified aftercare: Secondary | ICD-10-CM | POA: Diagnosis not present

## 2024-08-11 MED ORDER — AMOXICILLIN-POT CLAVULANATE 875-125 MG PO TABS: 1.0000 | ORAL_TABLET | Freq: Two times a day (BID) | ORAL | 0 refills | Status: DC

## 2024-08-11 NOTE — Progress Notes (Signed)
 "  Established Patient Office Visit  Subjective   Patient ID: Alex [name], male    DOB: 1956-06-13  Age: 68 y.o. MRN: [id number]  Chief Complaint  Patient presents with   Cough   Sore Throat        Nasal Congestion    HPI    Alex [name] is seen today as a work in with upper respiratory symptoms of sore throat, nasal congestion, cough.  He was around several grandchildren over the holidays and thinks he may have picked up something from them.  He states one of his grandchildren had had RSV recently.  He is not aware of any associated fever.  Cough has been productive of yellow sputum.  He has listed problem of bronchiectasis and asthma but denies either these.  Non-smoker.  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  He does state he has had pneumonia frequently in the past.  He also has prostate cancer which is being treated at [facility].  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 Maude Halt, oncology. Receives monthly IVIG 40 grams.   Depression    Epicondylitis    right elbow   GERD (gastroesophageal reflux disease)    Glaucoma BOTH EYES   RIGHT EYE RADIATION DAMAGE   Hearing loss    Bilateral   Hepatic cyst    Several, The lesion of concern in segment 6 of the liver has single large portal vein and hepatic vein branches extending to tt, in a pattern of enhancement which mirrors these vascular structures. 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 9 CM X 4. 3 CM PER NOV 06-14-17  CHEST CTFOLOWED BY DR PIETRO YEARLY FOR   Positional vertigo    HX OF WITH SINUS INFECTIONS   Prostate cancer (HCC)    Follows w/ Dr. Debby Polascik @ [facility].   Radial head fracture    Right   Squamous cell carcinoma of skin 06/04/2016   in situ-crown of scalp   Squamous cell carcinoma of skin 04/22/2017   in situ-crown scalp (txpbx)   Thoracic aortic aneurysm 06/14/2017   last CT 4.1 CM Mild   Tinnitus    CONSTANT   Ulnar nerve compression    right  elbow   Unsteady gait    especially with stairs, depth perception off   Urinary hesitancy    Wears glasses    Past Surgical History:  Procedure Laterality Date   CARDIAC CATHETERIZATION  01-16-2006  DR DEBBY WALL   MILD CORONARY ATHEROSCLEROSIS/ MID TO DISTAL LAD 40% STENOSIS/ LVF 50-55%   CARPAL TUNNEL RELEASE Right 11/03/2017   Procedure: RIGHT HAND CARPAL TUNNEL RELEASE;  Surgeon: Shari Easter, MD;  Location: [facility];  Service: Orthopedics;  Laterality: Right;   CATARACT EXTRACTION Right    COLONSCOPY  2017 LAST DONE   MULTIPLE   CYST EXCISION Right 09/04/2023   Procedure: EXCISION CYST RIGHT SHOULDER;  Surgeon: Eletha Boas, MD;  Location: [facility];  Service: General;  Laterality: Right;  LOCAL & MAC   ENDOSCOPY  LAST 2017   MULTIPLE DONE DILATION DONE ALSO   ESOPHAGOGASTRODUODENOSCOPY (EGD) WITH PROPOFOL  N/A 02/24/2018   Procedure: ESOPHAGOGASTRODUODENOSCOPY (EGD) WITH PROPOFOL ;  Surgeon: Celestia Agent, MD;  Location: [facility];  Service: Endoscopy;  Laterality: N/A;   EXCISION RADIAL HEAD Right 11/03/2017   Procedure: RIGHT PROXIMAL RADIUS RADIAL HEAD RESECTION AND JOINT DEBRIDEMENT;  Surgeon: Shari Easter, MD;  Location: [facility] [place];  Service: Orthopedics;  Laterality: Right;   EXTRACORPOREAL SHOCK WAVE LITHOTRIPSY Right 11/20/2020   Procedure: EXTRACORPOREAL SHOCK WAVE LITHOTRIPSY (ESWL);  Surgeon: Alvaro Hummer, MD;  Location: [facility];  Service: Urology;  Laterality: Right;  75 MINS   KNEE ARTHROSCOPY  05/01/2012   Procedure: ARTHROSCOPY KNEE;  Surgeon: Reyes JAYSON Billing, MD;  Location: [facility];  Service: Orthopedics;  Laterality: Left;  debridement and removal of loose body   LEFT ANKLE ARTHROSCOPY W/ DEBRIDEMENT  05/12/2007   LEFT HYDROCELECTOMY  03/29/2005   AND REPAIR LEFT INGUINAL HERNIA W/ MESH   MOHS SURGERY  2002   RIGHT ORBITAL CANCER   NASAL ENDOSCOPY  08/07/2005   RIGHT  EPISTAXIS  / POST SEPTOPLASTY  (HX RIGHT ORBITAL CA & S/P RADIATION/ NECROSIS ANTERIOR END OF BOTH INFERIOR TURBINATES)   occuloplastic surgery  2002   PARS PLANA VITRECTOMY  11/06/2004   RIGHT EYE RADIATION RETINOPATHY W/ HEMORRHAGE   PROSTATE ABLATION N/A 06/2022   RADIAL HEAD ARTHROPLASTY Right 06/15/2018   Procedure: RIGHT ELBOW PROXIMAL RADIOULNAR JOINT DEBRIDEMENT AND ARTHROPLASTY;  Surgeon: Shari Easter, MD;  Location: [facility];  Service: Orthopedics;  Laterality: Right;  BLOCK WITH SEDATION   REPAIR UNDESENDED RIGHT TESTICLE / RIGHT INGUINAL HERNIA  AGE 79   REVERSE SHOULDER ARTHROPLASTY Right 07/01/2024   Procedure: ARTHROPLASTY, SHOULDER, TOTAL,  REVERSE;  Surgeon: Melita Drivers, MD;  Location: [facility];  Service: Orthopedics;  Laterality: Right;    RIGHT ANKLE ARTHROSCOPY W/ EXTENSIVE DEBRIDEMENT  04/05/2008   x2   RIGHT SHOULDER SURGERY  2006   RIGHT SUPRAOMOHYOID NECK DISSECTION   03/08/2003   ZONES 1,2,3;   SUBMANDIBULAR MASS / METASTATIC SQUAMOUS CELL CARCINOMA RIGHT NECK   SAVORY DILATION N/A 02/24/2018   Procedure: SAVORY DILATION;  Surgeon: Celestia Agent, MD;  Location: [facility];  Service: Endoscopy;  Laterality: N/A;   SEPTOPLASTY  06/2005   SHOULDER ARTHROSCOPY Left    SHOULDER ARTHROSCOPY W/ SUBACROMIAL DECOMPRESSION AND DISTAL CLAVICLE EXCISION  10/09/2008   AND DEBRIDEMENT OF RIGHT SHOULDER IMPINGEMENT & AC JOINT ARTHRITIS   SPINE SURGERY  2016   l 3 TO l 4 PLATE AND SCREWS   TOTAL THYROIDECTOMY  11/03/2001   PAPILLARY THYROID  CARCINOMA   TRANSTHORACIC ECHOCARDIOGRAM  07/2011   grade I diastolic dysfunction/ ef 55-60%   ULNAR NERVE TRANSPOSITION Right 04/28/2014   Procedure: RIGHT ELBOW ULNA NERVE RELEASE TRANSPOSTION AND MEDIAL EPICONDYLAR DEBRIDEMENT AND REPAIR;  Surgeon: Prentice LELON Pagan, MD;  Location: [facility];  Service: Orthopedics;  Laterality: Right;    reports that he has never smoked. He has never used smokeless  tobacco. He reports that he does not currently use alcohol . He reports that he does not use drugs. family history includes COPD in his father; Depression in his daughter, mother, and sister; Drug abuse in his daughter; Hypertension in his father; Kidney disease in his mother; Lung cancer in his brother and brother; Prostate cancer in his father; Psychiatric Illness in his son; Rectal cancer in his sister; Stroke in his father. Allergies[1]  Review of Systems  Constitutional:  Negative for chills and fever.  HENT:  Positive for congestion and sore throat.   Respiratory:  Positive for cough.   Cardiovascular:  Negative for chest pain.      Objective:     BP (!) 140/88   Pulse 74   Temp 97.6 F (36.4 C) (Oral)   Wt 198 lb 14.4 oz (90.2 kg)   SpO2 99%   BMI 26.98 kg/m  BP Readings from Last 3 Encounters:  08/11/24 (!) 140/88  07/01/24 (!) 145/94  06/25/24 (!) 144/91   Wt Readings from Last 3 Encounters:  08/11/24 198 lb 14.4 oz (90.2 kg)  07/01/24 184 lb (83.5 kg)  06/25/24 184 lb (83.5 kg)      Physical Exam Vitals reviewed.  Constitutional:      General: He is not in acute distress.    Appearance: He is not ill-appearing.  HENT:     Right Ear: Tympanic membrane normal.     Left Ear: Tympanic membrane normal.     Mouth/Throat:     Mouth: Mucous membranes are moist.     Pharynx: Oropharynx is clear.  Cardiovascular:     Rate and Rhythm: Normal rate and regular rhythm.  Pulmonary:     Effort: Pulmonary effort is normal.     Breath sounds: Normal breath sounds. No wheezing or rales.  Musculoskeletal:     Cervical back: Neck supple.  Neurological:     Mental Status: He is alert.      No results found for any visits on 08/11/24.    The 10-year ASCVD risk score (Arnett DK, et al., 2019) is: 37.3%    Assessment & Plan:   Productive cough.  Suspect he probably has viral URI.  He does have  risk factor of immunodeficiency as above.  Even though we explained this  is likely viral he has had pneumonia frequently in the past.  He states his cough has gotten much worse in the past day and we elected to go and cover with Augmentin  875 mg twice daily for 1 week.  Stay well-hydrated.  Follow-up promptly for any fever or increased shortness of breath.  O2 sats currently 99%.  Consider outpatient O2 monitoring for any shortness of breath  Wolm Scarlet, MD     [1]  Allergies Allergen Reactions   Oxycodone -Acetaminophen  Hives   "

## 2024-08-17 ENCOUNTER — Telehealth (HOSPITAL_COMMUNITY): Admitting: Psychiatry

## 2024-08-17 ENCOUNTER — Other Ambulatory Visit: Payer: Self-pay | Admitting: Cardiology

## 2024-08-17 ENCOUNTER — Encounter (HOSPITAL_COMMUNITY): Payer: Self-pay | Admitting: Psychiatry

## 2024-08-17 VITALS — Wt 198.0 lb

## 2024-08-17 DIAGNOSIS — F411 Generalized anxiety disorder: Secondary | ICD-10-CM

## 2024-08-17 DIAGNOSIS — F319 Bipolar disorder, unspecified: Secondary | ICD-10-CM

## 2024-08-17 DIAGNOSIS — F5101 Primary insomnia: Secondary | ICD-10-CM

## 2024-08-17 MED ORDER — LAMOTRIGINE ER 300 MG PO TB24: 1.0000 | ORAL_TABLET | ORAL | 0 refills | Status: AC

## 2024-08-17 MED ORDER — ZOLPIDEM TARTRATE 10 MG PO TABS: 10.0000 mg | ORAL_TABLET | Freq: Every evening | ORAL | 2 refills | Status: AC | PRN

## 2024-08-17 NOTE — Progress Notes (Signed)
 " [facility] MD Virtual Progress Note   Patient Location: Home Provider Location: Home Office  I connect with patient by video and verified that I am speaking with correct person by using two identifiers. I discussed the limitations of evaluation and management by telemedicine and the availability of in person appointments. I also discussed with the patient that there may be a patient responsible charge related to this service. The patient expressed understanding and agreed to proceed.  Alex [name] [id number] 69 y.o.  08/17/2024 9:22 AM  History of Present Illness:  Patient is evaluated by video session.  He reported not a thing happen since the last visit.  He had a surgery on his right shoulder and is recovering from surgery.  Patient told it may take another 6 weeks to get better.  He also had biopsy of the prostate at [facility] and waiting for the results.  He is anxious about the results.  He had lesion treated in the past for the prostate tumor.  Patient told he had comprehensive evaluation at neurology and did not have any sign of dementia.  His MRI showed white matter small vessel disease but patient do not struggle with memory impairment and like to go back on Ambien .  Ambien  was discontinued when he started to have gait problem, difficulty remembering things and had a fall.  He reported neurocognitive testing shows normal results.  Since back on Ambien  5 mg he is sleeping much better.  He had a good Christmas because family from [place] and other places came and house was full.  Patient told his house heat did not work upstairs and they have to use portable heaters which worked well.  He is seeing [facility] but has not had an appointment in past 3 weeks.  He denies any mania, psychosis, hallucination but chronic anxiety related to his general medical health.  His appetite is fair.  He is compliant with Lamictal  which is helping his mood swing anger.  Denies drinking or using any illegal  substances.  Past Psychiatric History: H/O depression, mood swing, anger and Inpatient at [facility] due to suicidal thoughts but no attempt.  No h/o psychosis but h/o poor impulse control.  Tried Klonopin  but did not work.  Took Seroquel, Elavil , Trazodone, Doxepin, melatonin, Cymbalta, BuSpar  and Remeron  (increase depression)    Past Medical History:  Diagnosis Date   Anemia    Anxiety    Atypical nevus 04/12/1997   dyplastic-left chest below nipple   Atypical nevus 01/18/2005   slight-mod-mid upper abd, slight-mod-right lateral chest-(WS), slight-mod-mid lower back (punch)   Atypical nevus 05/31/2005   dysplastic-central lowerback (exc), dysplastic- right abdomen (Exc)   Basal cell carcinoma 06/04/2016   back of neck   Bipolar I disorder (HCC)    Bleeding ulcer 2016   BPH (benign prostatic hypertrophy) with urinary obstruction    Cancer (HCC)    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 Maude Halt, oncology. Receives monthly  IVIG 40 grams.   Depression    Epicondylitis    right elbow   GERD (gastroesophageal reflux disease)    Glaucoma BOTH EYES   RIGHT EYE RADIATION DAMAGE   Hearing loss    Bilateral   Hepatic cyst    Several, The lesion of concern in segment 6 of the liver has single large portal vein and hepatic vein branches extending to tt, in a pattern of enhancement which mirrors these vascular structures. The appearance is most consistent with a non neoplastic portohepatic  venous shunt. 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 9 CM X 4. 3 CM PER NOV 06-14-17  CHEST CTFOLOWED BY DR PIETRO YEARLY FOR   Positional vertigo    HX OF WITH SINUS INFECTIONS   Prostate cancer (HCC)    Follows w/ Dr. Debby Polascik @ [facility].   Radial head fracture    Right   Squamous cell carcinoma of skin 06/04/2016   in situ-crown of scalp   Squamous cell carcinoma of skin 04/22/2017   in situ-crown scalp (txpbx)   Thoracic aortic aneurysm 06/14/2017   last CT 4.1 CM Mild   Tinnitus    CONSTANT   Ulnar nerve compression    right elbow   Unsteady gait    especially with stairs, depth perception off   Urinary hesitancy    Wears glasses     Outpatient Encounter Medications as of 08/17/2024  Medication Sig   acetaminophen  (TYLENOL ) 500 MG tablet Take by mouth.   amoxicillin -clavulanate (AUGMENTIN ) 875-125 MG tablet Take 1 tablet by mouth 2 (two) times daily.   aspirin  EC 81 MG tablet Take 1 tablet by mouth daily.   atorvastatin  (LIPITOR) 40 MG tablet TAKE 1 TABLET BY MOUTH EVERY DAY (Patient taking differently: Take 40 mg by mouth at bedtime.)   Coenzyme Q10 (CO Q 10 PO) Take 1 capsule by mouth daily.    erythromycin ophthalmic ointment  Place 1 Application into the right eye daily as needed (conjunctivitis).   HIZENTRA  4 GM/20ML SOSY Inject 1 Dose into the vein once a week.   LamoTRIgine  300 MG TB24 24 hour tablet Take 1 tablet (300 mg total) by mouth every morning.   latanoprost (XALATAN) 0.005 % ophthalmic solution Place 1 drop into both eyes at bedtime.   levothyroxine  (SYNTHROID ) 150 MCG tablet TAKE 1 TABLET BY MOUTH DAILY BEFORE BREAKFAST. (Patient taking differently: Take 150 mcg by mouth at bedtime.)   meloxicam  (MOBIC ) 15 MG tablet Take 1 tablet (15 mg total) by mouth daily as needed.  Multiple Vitamin (MULTI-VITAMINS) TABS Take 1 tablet by mouth daily.   nitroGLYCERIN  (NITROLINGUAL ) 0.4 MG/SPRAY spray Place 1 spray under the tongue every 5 (five) minutes x 3 doses as needed for chest pain. Esophageal spasms   OMEGA-3 KRILL OIL PO Take 1 capsule by mouth daily.    pantoprazole  (PROTONIX ) 40 MG tablet Take 1 tablet (40 mg total) by mouth daily.   propranolol  (INDERAL ) 40 MG tablet Take 1 tablet (40 mg total) by mouth 2 (two) times daily. (Patient taking differently: Take 40 mg by mouth at bedtime.)   sildenafil  (REVATIO ) 20 MG tablet Take 1 tablet (20 mg total) by mouth as needed.   sulfamethoxazole -trimethoprim  (BACTRIM  DS) 800-160 MG tablet TAKE 1 TABLET BY MOUTH TWICE A DAY   tadalafil  (CIALIS ) 5 MG tablet Take 1 tablet (5 mg total) by mouth daily.   tamsulosin  (FLOMAX ) 0.4 MG CAPS capsule Take 1 capsule (0.4 mg total) by mouth daily.   telmisartan  (MICARDIS ) 80 MG tablet TAKE 1 TABLET BY MOUTH EVERY DAY   Testosterone  20.25 MG/ACT (1.62%) GEL Apply 2 Pump topically daily.   timolol (TIMOPTIC) 0.5 % ophthalmic solution timolol maleate 0.5 % eye drops  INSTILL 1 DROP INTO RIGHT EYE TWICE A DAY   zolpidem  (AMBIEN ) 5 MG tablet Take 1 tablet (5 mg total) by mouth at bedtime as needed for sleep.   No facility-administered encounter medications on file as of 08/17/2024.    Recent Results (from the past 2160 hours)  CBG  monitoring, ED     Status: Abnormal   Collection Time: 06/01/24 10:21 AM  Result Value Ref Range   Glucose-Capillary 158 (H) 70 - 99 mg/dL    Comment: Glucose reference range applies only to samples taken after fasting for at least 8 hours.  Comprehensive metabolic panel     Status: Abnormal   Collection Time: 06/01/24 10:24 AM  Result Value Ref Range   Sodium 137 135 - 145 mmol/L   Potassium 4.9 3.5 - 5.1 mmol/L   Chloride 102 98 - 111 mmol/L   CO2 25 22 - 32 mmol/L   Glucose, Bld 158 (H) 70 - 99 mg/dL    Comment: Glucose reference range applies only to samples taken after fasting for at least 8 hours.   BUN 19 8 - 23 mg/dL   Creatinine, Ser 8.77 0.61 - 1.24 mg/dL   Calcium  8.7 (L) 8.9 - 10.3 mg/dL   Total Protein 6.6 6.5 - 8.1 g/dL   Albumin 4.0 3.5 - 5.0 g/dL   AST 32 15 - 41 U/L    Comment: HEMOLYSIS AT THIS LEVEL MAY AFFECT RESULT   ALT 25 0 - 44 U/L   Alkaline Phosphatase 90 38 - 126 U/L   Total Bilirubin 0.7 0.0 - 1.2 mg/dL   GFR, Estimated >39 >39 mL/min    Comment: (NOTE) Calculated using the CKD-EPI Creatinine Equation (2021)    Anion gap 10 5 - 15    Comment: Performed at [facility], [address], [place], [name] [address]  CBC     Status: Abnormal   Collection Time: 06/01/24 10:24 AM  Result Value Ref Range   WBC 11.8 (H) 4.0 - 10.5 K/uL   RBC 5.43 4.22 - 5.81 MIL/uL   Hemoglobin 14.7 13.0 - 17.0 g/dL   HCT 54.8 60.9 - 47.9 %   MCV 83.1 80.0 - 100.0 fL   MCH 27.1 26.0 - 34.0 pg   MCHC 32.6 30.0 - 36.0 g/dL   RDW  16.6 (H) 11.5 - 15.5 %   Platelets 219 150 - 400 K/uL   nRBC 0.0 0.0 - 0.2 %    Comment: Performed at [facility], [address], [place], [name] [address]  Urinalysis, Routine w reflex microscopic -Urine, Clean Catch     Status: Abnormal   Collection Time: 06/01/24 11:12 AM  Result Value Ref Range   Color, Urine YELLOW YELLOW   APPearance CLEAR CLEAR   Specific Gravity, Urine 1.018 1.005 - 1.030   pH  6.0 5.0 - 8.0   Glucose, UA NEGATIVE NEGATIVE mg/dL   Hgb urine dipstick SMALL (A) NEGATIVE   Bilirubin Urine NEGATIVE NEGATIVE   Ketones, ur NEGATIVE NEGATIVE mg/dL   Protein, ur NEGATIVE NEGATIVE mg/dL   Nitrite NEGATIVE NEGATIVE   Leukocytes,Ua NEGATIVE NEGATIVE   RBC / HPF >50 0 - 5 RBC/hpf   WBC, UA 0-5 0 - 5 WBC/hpf   Bacteria, UA RARE (A) NONE SEEN   Squamous Epithelial / HPF 0-5 0 - 5 /HPF   Mucus PRESENT    Hyaline Casts, UA PRESENT     Comment: Performed at [facility], [address] [place], [name] [address]  Urine Culture     Status: Abnormal   Collection Time: 06/01/24 11:12 AM   Specimen: Urine, Clean Catch  Result Value Ref Range   Specimen Description      URINE, CLEAN CATCH Performed at [facility], [address], [place], [name] [address]    Special Requests      NONE Performed at [facility], [address], [place], [name] [address]    Culture (A)     <10,000 COLONIES/mL INSIGNIFICANT GROWTH Performed at [facility], [address] [place], [name] [address]    Report Status 06/02/2024 FINAL   Troponin T, High Sensitivity     Status: None   Collection Time: 06/01/24  1:22 PM  Result Value Ref Range   Troponin T High Sensitivity <15 0 - 19 ng/L    Comment: Hemolysis at this level may affect result  (NOTE) Biotin concentrations > 1000 ng/mL falsely decrease TnT results.  Serial cardiac troponin measurements are suggested.  Refer to the Links section for chest pain algorithms and additional  guidance. Performed at [facility], [address], [place], [name] [address]   Urinalysis     Status: Abnormal   Collection Time: 06/23/24  9:43 AM  Result Value Ref Range   Color, Urine YELLOW Yellow;Lt. Yellow;Straw;Dark Yellow;Amber;Green;Red;Brown   APPearance CLEAR Clear;Turbid;Slightly Cloudy;Cloudy   Specific Gravity, Urine 1.025 1.000 - 1.030   pH 5.0 5.0 -  8.0   Total Protein, Urine NEGATIVE Negative   Urine Glucose NEGATIVE Negative   Ketones, ur TRACE (A) Negative   Bilirubin Urine NEGATIVE Negative   Hgb urine dipstick NEGATIVE Negative   Urobilinogen, UA 0.2 0.0 - 1.0   Leukocytes,Ua NEGATIVE Negative   Nitrite NEGATIVE Negative  Surgical pcr screen     Status: Abnormal   Collection Time: 06/25/24 10:21 AM   Specimen: Nasal Mucosa; Nasal Swab  Result Value Ref Range   MRSA, PCR NEGATIVE NEGATIVE   Staphylococcus aureus POSITIVE (A) NEGATIVE    Comment: (NOTE) The Xpert SA Assay (FDA approved for NASAL specimens in patients 25 years of age and older), is one component of a comprehensive surveillance program. It is not intended to diagnose infection nor to guide or monitor treatment. Performed at [facility], [address], [place], [name] [address]      Psychiatric Specialty Exam: Physical Exam  Review of Systems  Musculoskeletal:        Right shoulder pain    Weight 198 lb (89.8 kg).There is no height or weight on file to calculate BMI.  General Appearance: Casual  Eye Contact:  Fair  Speech:  Slow  Volume:  Normal  Mood:  Anxious  Affect:  Congruent  Thought Process:  Goal Directed  Orientation:  Full (Time, Place, and Person)  Thought Content:  Rumination  Suicidal Thoughts:  No  Homicidal Thoughts:  No  Memory:  Immediate;   Fair Recent;   Good Remote;   Fair  Judgement:  Intact  Insight:  Present  Psychomotor Activity:  Decreased  Concentration:  Concentration: Fair and Attention Span: Fair  Recall:  Good  Fund of Knowledge:  Good  Language:  Good  Akathisia:  No  Handed:  Right  AIMS (if indicated):     Assets:  Communication Skills Desire for Improvement Housing Social Support Transportation  ADL's:  Intact  Cognition:  WNL  Sleep:  better with Ambien         06/23/2024    9:11 AM 04/01/2024    2:16 PM 02/26/2024    3:00 PM 01/14/2024    2:12 PM 11/12/2023    2:08 PM   Depression screen PHQ 2/9  Decreased Interest 1 0 0 1 1  Down, Depressed, Hopeless 2 1 0 1 1  PHQ - 2 Score 3 1 0 2 2  Altered sleeping 2    1  Tired, decreased energy 2    1  Change in appetite 0    0  Feeling bad or failure about yourself  1    1  Trouble concentrating 0    0  Moving slowly or fidgety/restless 0    0  Suicidal thoughts 0    0  PHQ-9 Score 8    5   Difficult doing work/chores Not difficult at all    Not difficult at all     Data saved with a previous flowsheet row definition    Assessment/Plan: Bipolar I disorder (HCC) - Plan: LamoTRIgine  300 MG TB24 24 hour tablet  GAD (generalized anxiety disorder) - Plan: LamoTRIgine  300 MG TB24 24 hour tablet  Primary insomnia - Plan: LamoTRIgine  300 MG TB24 24 hour tablet, zolpidem  (AMBIEN ) 10 MG tablet  Patient is 69 year old married man with history of coronary atherosclerosis, hypertension, aortic aneurysm, retinopathy, GERD, hypothyroidism, chronic pain, arthritis, benign prostate hyperplasia status post prostate cancer, bipolar disorder, generalized anxiety disorder, chronic insomnia and recently right shoulder surgery.  Reviewed collateral information from other provider and blood work results.  Liver enzymes normal.  Patient back on 5 mg Ambien  which is helping his sleep.  He usually gets 10 mg Ambien  and cut in a half and take 5 mg.  He is in a process of changing his pharmacy but like to keep the [facility] for now because future refills will get from [facility].  He is hoping quick recovery from recent shoulder surgery.  Discussed medication side effects as hypnotic can cause memory impairment but patient recently had neurocognitive testing and reported they were normal.  Continue Lamictal  300 mg daily.  Recommend to call back if is any question or any concern.  Patient is going to see [facility] for therapy.  Will follow-up in 3 months.  Follow Up Instructions:     I discussed  the assessment and treatment plan with the patient. The  patient was provided an opportunity to ask questions and all were answered. The patient agreed with the plan and demonstrated an understanding of the instructions.   The patient was advised to call back or seek an in-person evaluation if the symptoms worsen or if the condition fails to improve as anticipated.    Collaboration of Care: Other provider involved in patient's care AEB notes are available in epic to review.  Patient/Guardian was advised Release of Information must be obtained prior to any record release in order to collaborate their care with an outside provider. Patient/Guardian was advised if they have not already done so to contact the registration department to sign all necessary forms in order for us  to release information regarding their care.   Consent: Patient/Guardian gives verbal consent for treatment and assignment of benefits for services provided during this visit. Patient/Guardian expressed understanding and agreed to proceed.     Total encounter time 26 minutes which includes face-to-face time, chart reviewed, care coordination, order entry and documentation during this encounter.   Note: This document was prepared by [employer] voice dictation technology and any errors that results from this process are unintentional.    Leni ONEIDA Client, MD 08/17/2024   "

## 2024-08-18 ENCOUNTER — Ambulatory Visit (HOSPITAL_COMMUNITY): Admitting: Licensed Clinical Social Worker

## 2024-08-18 MED ORDER — PROPRANOLOL HCL 40 MG PO TABS: 40.0000 mg | ORAL_TABLET | Freq: Two times a day (BID) | ORAL | 2 refills | Status: AC

## 2024-08-18 MED ORDER — TELMISARTAN 80 MG PO TABS: 80.0000 mg | ORAL_TABLET | Freq: Every day | ORAL | 2 refills | Status: AC

## 2024-08-19 ENCOUNTER — Encounter (HOSPITAL_BASED_OUTPATIENT_CLINIC_OR_DEPARTMENT_OTHER): Payer: Self-pay | Admitting: Physical Therapy

## 2024-08-19 ENCOUNTER — Ambulatory Visit (HOSPITAL_BASED_OUTPATIENT_CLINIC_OR_DEPARTMENT_OTHER): Attending: Orthopedic Surgery | Admitting: Physical Therapy

## 2024-08-19 DIAGNOSIS — M6281 Muscle weakness (generalized): Secondary | ICD-10-CM | POA: Diagnosis present

## 2024-08-19 DIAGNOSIS — M25511 Pain in right shoulder: Secondary | ICD-10-CM | POA: Insufficient documentation

## 2024-08-19 DIAGNOSIS — M25611 Stiffness of right shoulder, not elsewhere classified: Secondary | ICD-10-CM | POA: Diagnosis present

## 2024-08-19 NOTE — Therapy (Signed)
 " OUTPATIENT PHYSICAL THERAPY SHOULDER EVALUATION   Patient Name: Alex [name] MRN: [id number] DOB:07/07/1956, 69 y.o., male Today's Date: 08/19/2024  END OF SESSION:  PT End of Session - 08/19/24 1256     Visit Number 2    Number of Visits 25    Date for Recertification  10/22/24    Authorization Type [employer]    PT Start Time 1300    PT Stop Time 1346    PT Time Calculation (min) 46 min    Activity Tolerance Patient tolerated treatment well    Behavior During Therapy [facility] for tasks assessed/performed          Past Medical History:  Diagnosis Date   Anemia    Anxiety    Atypical nevus 04/12/1997   dyplastic-left chest below nipple   Atypical nevus 01/18/2005   slight-mod-mid upper abd, slight-mod-right lateral chest-(WS), slight-mod-mid lower back (punch)   Atypical nevus 05/31/2005   dysplastic-central lowerback (exc), dysplastic- right abdomen (Exc)   Basal cell carcinoma 06/04/2016   back of neck   Bipolar I disorder (HCC)    Bleeding ulcer 2016   BPH (benign prostatic hypertrophy) with urinary obstruction    Cancer (HCC)    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 Maude Halt, oncology. Receives monthly IVIG 40 grams.   Depression    Epicondylitis    right elbow   GERD (gastroesophageal reflux disease)    Glaucoma BOTH EYES   RIGHT EYE RADIATION DAMAGE   Hearing loss    Bilateral   Hepatic cyst    Several, The lesion of concern in segment 6 of the liver  has single large portal vein and hepatic vein branches extending to tt, in a pattern of enhancement which mirrors these vascular structures. 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 9 CM X 4. 3 CM PER NOV 06-14-17  CHEST CTFOLOWED BY DR PIETRO YEARLY FOR   Positional vertigo  HX OF WITH SINUS INFECTIONS   Prostate cancer [facility])    Follows w/ Dr. Debby Polascik @ [facility].   Radial head fracture    Right   Squamous cell carcinoma of skin 06/04/2016   in situ-crown of scalp   Squamous cell carcinoma of skin 04/22/2017   in situ-crown scalp (txpbx)   Thoracic aortic aneurysm 06/14/2017   last CT 4.1 CM Mild   Tinnitus    CONSTANT   Ulnar nerve compression    right elbow   Unsteady gait    especially with stairs, depth perception off   Urinary hesitancy    Wears glasses    Past Surgical History:  Procedure Laterality Date   CARDIAC CATHETERIZATION  01-16-2006  DR DEBBY WALL   MILD CORONARY ATHEROSCLEROSIS/ MID TO DISTAL LAD 40% STENOSIS/ LVF 50-55%   CARPAL TUNNEL RELEASE Right 11/03/2017   Procedure: RIGHT HAND CARPAL TUNNEL RELEASE;  Surgeon: Shari Easter, MD;   Location: [facility];  Service: Orthopedics;  Laterality: Right;   CATARACT EXTRACTION Right    COLONSCOPY  2017 LAST DONE   MULTIPLE   CYST EXCISION Right 09/04/2023   Procedure: EXCISION CYST RIGHT SHOULDER;  Surgeon: Eletha Boas, MD;  Location: [facility];  Service: General;  Laterality: Right;  LOCAL & MAC   ENDOSCOPY  LAST 2017   MULTIPLE DONE DILATION DONE ALSO   ESOPHAGOGASTRODUODENOSCOPY (EGD) WITH PROPOFOL  N/A 02/24/2018   Procedure: ESOPHAGOGASTRODUODENOSCOPY (EGD) WITH PROPOFOL ;  Surgeon: Celestia Agent, MD;  Location: [facility];  Service: Endoscopy;  Laterality: N/A;   EXCISION RADIAL HEAD Right 11/03/2017   Procedure: RIGHT PROXIMAL RADIUS RADIAL HEAD RESECTION AND JOINT DEBRIDEMENT;  Surgeon: Shari Easter, MD;  Location: [facility] [place];  Service: Orthopedics;  Laterality: Right;   EXTRACORPOREAL SHOCK WAVE LITHOTRIPSY Right 11/20/2020   Procedure: EXTRACORPOREAL SHOCK WAVE LITHOTRIPSY (ESWL);  Surgeon: Alvaro Hummer, MD;  Location: [facility];  Service: Urology;  Laterality: Right;  75 MINS   KNEE ARTHROSCOPY  05/01/2012   Procedure: ARTHROSCOPY KNEE;  Surgeon: Reyes JAYSON Billing, MD;  Location: [facility];  Service: Orthopedics;  Laterality: Left;  debridement and removal of loose body   LEFT ANKLE ARTHROSCOPY W/ DEBRIDEMENT  05/12/2007   LEFT HYDROCELECTOMY  03/29/2005   AND REPAIR LEFT INGUINAL HERNIA W/ MESH   MOHS SURGERY  2002   RIGHT ORBITAL CANCER   NASAL ENDOSCOPY  08/07/2005   RIGHT EPISTAXIS  / POST SEPTOPLASTY  (HX RIGHT ORBITAL CA & S/P RADIATION/ NECROSIS ANTERIOR END OF BOTH INFERIOR TURBINATES)   occuloplastic surgery  2002   PARS PLANA VITRECTOMY  11/06/2004   RIGHT EYE RADIATION RETINOPATHY W/ HEMORRHAGE   PROSTATE ABLATION N/A 06/2022   RADIAL HEAD ARTHROPLASTY Right 06/15/2018   Procedure: RIGHT ELBOW PROXIMAL RADIOULNAR JOINT DEBRIDEMENT AND ARTHROPLASTY;  Surgeon: Shari Easter, MD;  Location: [facility] ;  Service: Orthopedics;  Laterality: Right;  BLOCK WITH SEDATION   REPAIR UNDESENDED RIGHT TESTICLE / RIGHT INGUINAL HERNIA  AGE 48   REVERSE SHOULDER ARTHROPLASTY Right 07/01/2024   Procedure: ARTHROPLASTY, SHOULDER, TOTAL, REVERSE;  Surgeon: Melita Drivers, MD;  Location: [facility];  Service: Orthopedics;  Laterality: Right;    RIGHT ANKLE ARTHROSCOPY W/ EXTENSIVE DEBRIDEMENT  04/05/2008   x2   RIGHT SHOULDER SURGERY  2006   RIGHT SUPRAOMOHYOID NECK DISSECTION   03/08/2003   ZONES 1,2,3;   SUBMANDIBULAR MASS / METASTATIC SQUAMOUS CELL CARCINOMA RIGHT NECK   SAVORY DILATION  N/A 02/24/2018   Procedure: SAVORY DILATION;  Surgeon: Celestia Agent, MD;  Location: [facility];  Service: Endoscopy;  Laterality: N/A;   SEPTOPLASTY  06/2005   SHOULDER ARTHROSCOPY Left    SHOULDER ARTHROSCOPY W/ SUBACROMIAL DECOMPRESSION AND DISTAL CLAVICLE EXCISION  10/09/2008   AND DEBRIDEMENT OF RIGHT SHOULDER IMPINGEMENT & AC JOINT ARTHRITIS   SPINE SURGERY  2016   l 3 TO l 4 PLATE AND SCREWS   TOTAL THYROIDECTOMY  11/03/2001   PAPILLARY THYROID  CARCINOMA   TRANSTHORACIC ECHOCARDIOGRAM  07/2011   grade I diastolic dysfunction/ ef 55-60%   ULNAR NERVE TRANSPOSITION Right 04/28/2014   Procedure: RIGHT ELBOW ULNA NERVE RELEASE TRANSPOSTION AND MEDIAL EPICONDYLAR DEBRIDEMENT AND REPAIR;  Surgeon: Prentice LELON Pagan, MD;  Location: [facility];  Service: Orthopedics;  Laterality: Right;   Patient Active Problem List   Diagnosis Date Noted   Shoulder arthritis 06/23/2024   Bladder neck obstruction 06/23/2024   Esophageal spasm 04/01/2024   GERD (gastroesophageal reflux disease) 04/01/2024   Memory changes 04/01/2024   Primary osteoarthritis of both knees 09/22/2023   Boil 08/26/2023   Abscess 08/20/2023   Cellulitis 05/12/2023   Bruise of lower lip 04/17/2023   Wheezing 03/31/2023   Injury of medial collateral ligament (MCL) of knee 02/07/2023    Sprain of medial collateral ligament of right knee 02/07/2023   Osteoarthritis of subtalar joint 09/25/2022   Cystoid macular edema of right eye 07/18/2022   Hyperglycemia 07/18/2022   Nonexudative age-related macular degeneration, left eye, early dry stage 07/18/2022   Retinal edema 07/18/2022   Superficial punctate keratitis of right eye 07/18/2022   Grief at loss of child 05/22/2022   Actinic keratoses 05/22/2022   Pain in joint of left elbow 04/04/2022   Abnormal defecation 02/26/2022   Atrophic gastritis 02/26/2022   Bipolar 1 disorder (HCC) 02/26/2022   BPH (benign prostatic hyperplasia) 02/26/2022   Bronchiolectasis (HCC) 02/26/2022   Disease of thyroid  gland 02/26/2022   Diverticular disease of colon 02/26/2022   Family history of malignant neoplasm of digestive organs 02/26/2022   Hyperlipidemia 02/26/2022   IgG deficiency (HCC) 02/26/2022   Immune deficiency disorder 02/26/2022   Irritable bowel syndrome 02/26/2022   Pancreatic cyst 02/26/2022   History of colonic polyps 02/26/2022   Portosystemic shunt, 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/26/2022   Pyloric ulcer 02/26/2022   Scoliosis 02/26/2022   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   Spinal stenosis of lumbar region 02/26/2022   Prostate cancer (HCC) 02/26/2022   Dysuria 10/03/2021   Earache 06/14/2021   Anemia 06/14/2021   History of head and neck cancer 03/28/2021   Swelling of right parotid gland 03/28/2021   Epistaxis, recurrent 06/26/2020   PMR (polymyalgia rheumatica) 02/22/2020   Diverticulitis 01/25/2020   Abdominal pain 01/25/2020   Iliac vessel injury 11/30/2019   Stress at home 09/14/2019   LLQ abdominal pain 09/06/2019   IPMN (intraductal papillary mucinous neoplasm) 07/19/2019   History of thyroid  cancer 06/03/2019   Abnormal findings on diagnostic imaging of liver 06/03/2019   Epididymitis 03/16/2019   Difficulty urinating 03/16/2019   Pain in right foot 03/12/2019    Hypothyroidism (acquired) 09/29/2018   Moderate persistent asthma with acute exacerbation 09/29/2018   Prediabetes 09/29/2018   RTI (respiratory tract infection) 09/29/2018   ETD (Eustachian tube dysfunction), bilateral 09/02/2018   Sensorineural hearing loss (SNHL) of both ears 07/31/2018   Bruising 11/17/2017   Encounter for other orthopedic aftercare 11/14/2017   Cicatricial lagophthalmos of left lower eyelid  10/31/2017   Closed fracture of head of right radius 10/24/2017   Palatal mass 09/16/2017   Sore throat 09/16/2017   Testicular pain, right 08/01/2017   Combined form of age-related cataract, left eye 05/19/2017   Dry eye syndrome of both eyes 05/19/2017   Primary open angle glaucoma (POAG) of left eye, mild stage 05/19/2017   Radiation retinopathy, 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   Secondary glaucoma due to combination mechanisms, right, severe stage 05/19/2017   Fatigue 01/21/2017   Ankle pain, left 01/21/2017   Fall (on) (from) other stairs and steps, initial encounter 11/11/2016   Abrasion of head 11/11/2016   Ascending aortic aneurysm 07/23/2016   Erectile dysfunction 07/19/2016   Bronchiectasis (HCC) 04/23/2016   Chronic bronchitis (HCC) 03/11/2016   Cerumen impaction 08/04/2015   Bilateral impacted cerumen 08/04/2015   Cervical disc disorder with radiculopathy of cervical region 06/06/2014   Elevated WBC count 06/06/2014   Iron  deficiency anemia 06/06/2014   Edema 02/21/2014   Tinnitus 02/21/2014   Well adult exam 05/31/2013   Glaucoma 05/31/2013   RML pneumonia 03/31/2013   Hypertension, uncontrolled 02/25/2013   Anxiety disorder 08/08/2010   Chest pain, atypical 02/08/2010   ESOPHAGEAL STRICTURE 11/30/2009   Osteoarthritis 08/07/2009   Malaise and fatigue 05/14/2008   Coronary atherosclerosis 11/04/2007   Lumbago 11/04/2007   THYROIDECTOMY, HX OF 11/04/2007   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    PCP: Garald Scrape MD   REFERRING PROVIDER:  Melita Drivers MD   REFERRING DIAG: Diagnosis Z96.611 (ICD-10-CM) - Presence of right artificial shoulder joint  THERAPY DIAG:  Acute pain of right shoulder  Stiffness of right shoulder, not elsewhere classified  Muscle weakness (generalized)  Rationale for Evaluation and Treatment: Rehabilitation  ONSET DATE: Reverse TSA 07/01/24  SUBJECTIVE:                                                                                                                                                                                      SUBJECTIVE STATEMENT:  Some movements are painful-performed abd + slight IR.  Was very busy over the holidays so worked exercise at home.   Hand dominance: Right  PERTINENT HISTORY: See above   PAIN:  Are you having pain? Yes: NPRS scale: 5/10 Pain location: R shoulder Pain description: sharp, numbness/tingling going down into forearm  Aggravating factors: IR of UE, reaching for things, pulling covers over himself  Relieving factors: medicine and ice   PRECAUTIONS: Shoulder and Other: poor vision R eye, active CA found recently but not sure if he will go back to chemo or radiation yet   RED FLAGS: None   WEIGHT BEARING RESTRICTIONS: No  FALLS:  Has patient fallen in last 6 months? Yes. Number of falls 1- fainted at breakfast table   LIVING ENVIRONMENT: Lives with: lives with their family Lives in: House/apartment   OCCUPATION: On disability   PLOF: Independent, Independent with basic ADLs, Independent with gait, and Independent with transfers  PATIENT GOALS:get full use of shoulder and address pain  NEXT MD VISIT:   OBJECTIVE:  Note: Objective measures were completed at Evaluation unless otherwise noted.   PATIENT SURVEYS:  PSFS: THE PATIENT SPECIFIC FUNCTIONAL SCALE  Place score of 0-10 (0 = unable to perform activity and 10 = able to perform activity at the same level as before injury or problem)  Activity Date: 07/30/24 Eval     Dressing (tucking in shirt, buckling belt, etc) 3    2. Reaching behind the back  2    3. Reaching forward for something  4    4.      Total Score 3      Total Score = Sum of activity scores/number of activities  Minimally Detectable Change: 3 points (for single activity); 2 points (for average score)  [facility] (nd). The Patient Specific Functional Scale . Retrieved from Skateoasis.com.[email or web address]   COGNITION: Overall cognitive status: Within functional limits for tasks assessed     SENSATION: Numbness medial forearm   POSTURE: Rounded shoulders, forward head   UPPER EXTREMITY ROM:    ROM Right eval  Shoulder flexion 110* PROM, AAROM 140*  Shoulder extension   Shoulder abduction 90* PROM  Shoulder adduction   Shoulder internal rotation   Shoulder external rotation 30* PROM directly by his side   Elbow flexion   Elbow extension   Wrist flexion   Wrist extension   Wrist ulnar deviation   Wrist radial deviation   Wrist pronation   Wrist supination   (Blank rows = not tested)  UPPER EXTREMITY MMT:  MMT Right eval Left eval  Shoulder flexion    Shoulder extension    Shoulder abduction    Shoulder adduction    Shoulder internal rotation    Shoulder external rotation    Middle trapezius    Lower trapezius    Elbow flexion    Elbow extension    Wrist flexion    Wrist extension    Wrist ulnar deviation    Wrist radial deviation    Wrist pronation    Wrist supination    Grip strength (lbs)    (Blank rows = not tested)  MMT not done at eval                                                                                                                                TREATMENT DATE:   Treatment                            08/19/24: Blank lines following charge title = not  provided on this treatment date.   Manual:  TPDN No STM upper traps, levator Passive flexion STM lats There-ex: Supine  shoulder flexion with deep breathing Seated scapular retraction There-Act: Discussed functional activities such as undressing, resting positions, sleeping Self Care:  Nuro-Re-ed:  Gait Training:     PATIENT EDUCATION: Education details: exam findings, POC, HEP, general course of care and expectations for rehab for reverse TSA, wrist exercises to be performed in elevation to help with edema  Person educated: Patient Education method: Explanation, Demonstration, and Handouts Education comprehension: verbalized understanding, returned demonstration, and needs further education  HOME EXERCISE PROGRAM: Access Code: [email or web address] URL: [email or web address] Date: 07/30/2024 Prepared by: Josette Rough    ASSESSMENT:  CLINICAL IMPRESSION: Overall progressing very well. Good activation of scapular retraction but is limited in awareness of posture with exercises. ROM is excellent- limited only by tightness in soft tissue.   OBJECTIVE IMPAIRMENTS: decreased ROM, decreased strength, hypomobility, increased edema, increased fascial restrictions, increased muscle spasms, impaired flexibility, impaired sensation, impaired UE functional use, improper body mechanics, postural dysfunction, and pain.   ACTIVITY LIMITATIONS: carrying, lifting, bathing, toileting, dressing, self feeding, reach over head, hygiene/grooming, and caring for others  PARTICIPATION LIMITATIONS: meal prep, cleaning, laundry, driving, shopping, community activity, and yard work  PERSONAL FACTORS: Age, Behavior pattern, Fitness, Past/current experiences, Social background, and Time since onset of injury/illness/exacerbation are also affecting patient's functional outcome.   REHAB POTENTIAL: Good  CLINICAL DECISION MAKING: Evolving/moderate complexity  EVALUATION COMPLEXITY: Moderate   GOALS: Goals reviewed with patient? No  SHORT TERM GOALS: Target date: 09/10/2024    Patient will be compliant with  appropriate progressive HEP        GOAL STATUS: Initial  2. R shoulder flexion and ABD A/PROM to be at least 160*         GOAL STATUS: Initial    3. R shoulder ER A/PROM to be at least 60*, will be able to reach L5 FIR level functionally           GOAL STATUS: Initial    4. Will be more aware of posture with all functional tasks with use of ergonomic aides PRN/as desired      GOAL STATUS: Initial   5. Edema and sensory impairments in R UE to have resolved GOAL STATUS: Initial    LONG TERM GOALS: Target date: 10/22/2024    MMT to have improved by at least one grade in all weak groups       GOAL STATUS: Initial    2. AROM to have normalized and will be pain free all planes of motion      GOAL STATUS: Initial    3. Pain to be no more than 2/10 with all functional tasks     GOAL STATUS: Initial   4. Will be able to perform all functional household and work based tasks without increase from resting pain levels     GOAL STATUS: Initial   5. PSFS to have improved by at least 3 points to show improved QOL and subjective perception of condition      GOAL STATUS: Initial   6. Will be able to perform all dressing activities, including doing belt and tucking his shirt in behind him without difficulty  GOAL STATUS: Initial   PLAN:  PT FREQUENCY: 2x/week  PT DURATION: 12 weeks  PLANNED INTERVENTIONS: [id number]- PT Re-evaluation, 97750- Physical Performance Testing, 97110-Therapeutic exercises, 97530- Therapeutic activity, [id number]- Neuromuscular re-education, 97535- Self Care, [id number]- Manual therapy, [id number]- Aquatic Therapy, [id number]-  Electrical stimulation (unattended), [id number]- Vasopneumatic device, [id number]- Ultrasound, and 02966- Ionotophoresis 4mg /ml Dexamethasone   PLAN FOR NEXT SESSION: per reverse TSA protocol (from Dr. Melita, did not have formal protocol so used [facility] protocol below); keep in mind long head of biceps was tenodesed    [email or web address]  Harlene BROCKS. Zia Kanner PT, DPT 08/19/2024 3:14 PM   "

## 2024-08-23 LAB — LAB REPORT - SCANNED: EGFR: 88

## 2024-08-24 ENCOUNTER — Ambulatory Visit (INDEPENDENT_AMBULATORY_CARE_PROVIDER_SITE_OTHER): Admitting: Licensed Clinical Social Worker

## 2024-08-24 DIAGNOSIS — F319 Bipolar disorder, unspecified: Secondary | ICD-10-CM | POA: Diagnosis not present

## 2024-08-25 ENCOUNTER — Ambulatory Visit (HOSPITAL_BASED_OUTPATIENT_CLINIC_OR_DEPARTMENT_OTHER)

## 2024-08-25 ENCOUNTER — Encounter (HOSPITAL_BASED_OUTPATIENT_CLINIC_OR_DEPARTMENT_OTHER): Payer: Self-pay

## 2024-08-25 ENCOUNTER — Encounter: Payer: Self-pay | Admitting: Hematology & Oncology

## 2024-08-25 DIAGNOSIS — M25611 Stiffness of right shoulder, not elsewhere classified: Secondary | ICD-10-CM

## 2024-08-25 DIAGNOSIS — M25511 Pain in right shoulder: Secondary | ICD-10-CM

## 2024-08-25 DIAGNOSIS — M6281 Muscle weakness (generalized): Secondary | ICD-10-CM

## 2024-08-25 NOTE — Therapy (Signed)
 " OUTPATIENT PHYSICAL THERAPY SHOULDER EVALUATION   Patient Name: Alex [name] MRN: [id number] DOB:1955/09/15, 69 y.o., male Today's Date: 08/25/2024  END OF SESSION:  PT End of Session - 08/25/24 0946     Visit Number 3    Number of Visits 25    Date for Recertification  10/22/24    Authorization Type [employer]    PT Start Time 0945   Pt arrived late   PT Stop Time 1019    PT Time Calculation (min) 34 min    Activity Tolerance Patient tolerated treatment well    Behavior During Therapy [facility] for tasks assessed/performed           Past Medical History:  Diagnosis Date   Anemia    Anxiety    Atypical nevus 04/12/1997   dyplastic-left chest below nipple   Atypical nevus 01/18/2005   slight-mod-mid upper abd, slight-mod-right lateral chest-(WS), slight-mod-mid lower back (punch)   Atypical nevus 05/31/2005   dysplastic-central lowerback (exc), dysplastic- right abdomen (Exc)   Basal cell carcinoma 06/04/2016   back of neck   Bipolar I disorder (HCC)    Bleeding ulcer 2016   BPH (benign prostatic hypertrophy) with urinary obstruction    Cancer (HCC)    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 Maude Halt, oncology. Receives monthly IVIG 40 grams.   Depression    Epicondylitis    right elbow   GERD (gastroesophageal reflux disease)    Glaucoma BOTH EYES   RIGHT EYE RADIATION DAMAGE   Hearing loss    Bilateral   Hepatic cyst    Several, The lesion of concern in  segment 6 of the liver has single large portal vein and hepatic vein branches extending to tt, in a pattern of enhancement which mirrors these vascular structures. 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 9 CM X 4. 3 CM PER NOV 06-14-17  CHEST CTFOLOWED BY DR PIETRO RAVEN FOR  Positional vertigo    HX OF WITH SINUS INFECTIONS   Prostate cancer [facility])    Follows w/ Dr. Debby Polascik @ [facility].   Radial head fracture    Right   Squamous cell carcinoma of skin 06/04/2016   in situ-crown of scalp   Squamous cell carcinoma of skin 04/22/2017   in situ-crown scalp (txpbx)   Thoracic aortic aneurysm 06/14/2017   last CT 4.1 CM Mild   Tinnitus    CONSTANT   Ulnar nerve compression    right elbow   Unsteady gait    especially with stairs, depth perception off   Urinary hesitancy    Wears glasses    Past Surgical History:  Procedure Laterality Date   CARDIAC CATHETERIZATION  01-16-2006  DR DEBBY WALL   MILD CORONARY ATHEROSCLEROSIS/ MID TO DISTAL LAD 40% STENOSIS/ LVF 50-55%   CARPAL TUNNEL RELEASE Right 11/03/2017   Procedure: RIGHT HAND CARPAL TUNNEL RELEASE;   Surgeon: Shari Easter, MD;  Location: [facility];  Service: Orthopedics;  Laterality: Right;   CATARACT EXTRACTION Right    COLONSCOPY  2017 LAST DONE   MULTIPLE   CYST EXCISION Right 09/04/2023   Procedure: EXCISION CYST RIGHT SHOULDER;  Surgeon: Eletha Boas, MD;  Location: [facility];  Service: General;  Laterality: Right;  LOCAL & MAC   ENDOSCOPY  LAST 2017   MULTIPLE DONE DILATION DONE ALSO   ESOPHAGOGASTRODUODENOSCOPY (EGD) WITH PROPOFOL  N/A 02/24/2018   Procedure: ESOPHAGOGASTRODUODENOSCOPY (EGD) WITH PROPOFOL ;  Surgeon: Celestia Agent, MD;  Location: [facility];  Service: Endoscopy;  Laterality: N/A;   EXCISION RADIAL HEAD Right 11/03/2017   Procedure: RIGHT PROXIMAL RADIUS RADIAL HEAD RESECTION AND JOINT DEBRIDEMENT;  Surgeon: Shari Easter, MD;  Location: [facility] [place];  Service: Orthopedics;  Laterality: Right;   EXTRACORPOREAL SHOCK WAVE LITHOTRIPSY Right 11/20/2020   Procedure: EXTRACORPOREAL SHOCK WAVE LITHOTRIPSY (ESWL);  Surgeon: Alvaro Hummer, MD;  Location: [facility];  Service: Urology;  Laterality: Right;  75 MINS   KNEE ARTHROSCOPY  05/01/2012   Procedure: ARTHROSCOPY KNEE;  Surgeon: Reyes JAYSON Billing, MD;  Location: [facility];  Service: Orthopedics;  Laterality: Left;  debridement and removal of loose body   LEFT ANKLE ARTHROSCOPY W/ DEBRIDEMENT  05/12/2007   LEFT HYDROCELECTOMY  03/29/2005   AND REPAIR LEFT INGUINAL HERNIA W/ MESH   MOHS SURGERY  2002   RIGHT ORBITAL CANCER   NASAL ENDOSCOPY  08/07/2005   RIGHT EPISTAXIS  / POST SEPTOPLASTY  (HX RIGHT ORBITAL CA & S/P RADIATION/ NECROSIS ANTERIOR END OF BOTH INFERIOR TURBINATES)   occuloplastic surgery  2002   PARS PLANA VITRECTOMY  11/06/2004   RIGHT EYE RADIATION RETINOPATHY W/ HEMORRHAGE   PROSTATE ABLATION N/A 06/2022   RADIAL HEAD ARTHROPLASTY Right 06/15/2018   Procedure: RIGHT ELBOW PROXIMAL RADIOULNAR JOINT DEBRIDEMENT AND  ARTHROPLASTY;  Surgeon: Shari Easter, MD;  Location: [facility];  Service: Orthopedics;  Laterality: Right;  BLOCK WITH SEDATION   REPAIR UNDESENDED RIGHT TESTICLE / RIGHT INGUINAL HERNIA  AGE 30   REVERSE SHOULDER ARTHROPLASTY Right 07/01/2024   Procedure: ARTHROPLASTY, SHOULDER, TOTAL, REVERSE;  Surgeon: Melita Drivers, MD;  Location: [facility];  Service: Orthopedics;  Laterality: Right;    RIGHT ANKLE ARTHROSCOPY W/ EXTENSIVE DEBRIDEMENT  04/05/2008   x2   RIGHT SHOULDER SURGERY  2006   RIGHT SUPRAOMOHYOID NECK DISSECTION   03/08/2003   ZONES 1,2,3;   SUBMANDIBULAR MASS / METASTATIC SQUAMOUS CELL CARCINOMA RIGHT  NECK   SAVORY DILATION N/A 02/24/2018   Procedure: SAVORY DILATION;  Surgeon: Celestia Agent, MD;  Location: [facility];  Service: Endoscopy;  Laterality: N/A;   SEPTOPLASTY  06/2005   SHOULDER ARTHROSCOPY Left    SHOULDER ARTHROSCOPY W/ SUBACROMIAL DECOMPRESSION AND DISTAL CLAVICLE EXCISION  10/09/2008   AND DEBRIDEMENT OF RIGHT SHOULDER IMPINGEMENT & AC JOINT ARTHRITIS   SPINE SURGERY  2016   l 3 TO l 4 PLATE AND SCREWS   TOTAL THYROIDECTOMY  11/03/2001   PAPILLARY THYROID  CARCINOMA   TRANSTHORACIC ECHOCARDIOGRAM  07/2011   grade I diastolic dysfunction/ ef 55-60%   ULNAR NERVE TRANSPOSITION Right 04/28/2014   Procedure: RIGHT ELBOW ULNA NERVE RELEASE TRANSPOSTION AND MEDIAL EPICONDYLAR DEBRIDEMENT AND REPAIR;  Surgeon: Prentice LELON Pagan, MD;  Location: [facility];  Service: Orthopedics;  Laterality: Right;   Patient Active Problem List   Diagnosis Date Noted   Shoulder arthritis 06/23/2024   Bladder neck obstruction 06/23/2024   Esophageal spasm 04/01/2024   GERD (gastroesophageal reflux disease) 04/01/2024   Memory changes 04/01/2024   Primary osteoarthritis of both knees 09/22/2023   Boil 08/26/2023   Abscess 08/20/2023   Cellulitis 05/12/2023   Bruise of lower lip 04/17/2023   Wheezing 03/31/2023   Injury of medial collateral  ligament (MCL) of knee 02/07/2023   Sprain of medial collateral ligament of right knee 02/07/2023   Osteoarthritis of subtalar joint 09/25/2022   Cystoid macular edema of right eye 07/18/2022   Hyperglycemia 07/18/2022   Nonexudative age-related macular degeneration, left eye, early dry stage 07/18/2022   Retinal edema 07/18/2022   Superficial punctate keratitis of right eye 07/18/2022   Grief at loss of child 05/22/2022   Actinic keratoses 05/22/2022   Pain in joint of left elbow 04/04/2022   Abnormal defecation 02/26/2022   Atrophic gastritis 02/26/2022   Bipolar 1 disorder (HCC) 02/26/2022   BPH (benign prostatic hyperplasia) 02/26/2022   Bronchiolectasis (HCC) 02/26/2022   Disease of thyroid  gland 02/26/2022   Diverticular disease of colon 02/26/2022   Family history of malignant neoplasm of digestive organs 02/26/2022   Hyperlipidemia 02/26/2022   IgG deficiency (HCC) 02/26/2022   Immune deficiency disorder 02/26/2022   Irritable bowel syndrome 02/26/2022   Pancreatic cyst 02/26/2022   History of colonic polyps 02/26/2022   Portosystemic shunt, 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/26/2022   Pyloric ulcer 02/26/2022   Scoliosis 02/26/2022   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   Spinal stenosis of lumbar region 02/26/2022   Prostate cancer (HCC) 02/26/2022   Dysuria 10/03/2021   Earache 06/14/2021   Anemia 06/14/2021   History of head and neck cancer 03/28/2021   Swelling of right parotid gland 03/28/2021   Epistaxis, recurrent 06/26/2020   PMR (polymyalgia rheumatica) 02/22/2020   Diverticulitis 01/25/2020   Abdominal pain 01/25/2020   Iliac vessel injury 11/30/2019   Stress at home 09/14/2019   LLQ abdominal pain 09/06/2019   IPMN (intraductal papillary mucinous neoplasm) 07/19/2019   History of thyroid  cancer 06/03/2019   Abnormal findings on diagnostic imaging of liver 06/03/2019   Epididymitis 03/16/2019   Difficulty urinating 03/16/2019   Pain  in right foot 03/12/2019   Hypothyroidism (acquired) 09/29/2018   Moderate persistent asthma with acute exacerbation 09/29/2018   Prediabetes 09/29/2018   RTI (respiratory tract infection) 09/29/2018   ETD (Eustachian tube dysfunction), bilateral 09/02/2018   Sensorineural hearing loss (SNHL) of both ears 07/31/2018   Bruising 11/17/2017   Encounter for other orthopedic aftercare 11/14/2017   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/31/2017   Closed fracture of head of right radius 10/24/2017   Palatal mass 09/16/2017   Sore throat 09/16/2017   Testicular pain, right 08/01/2017   Combined form of age-related cataract, left eye 05/19/2017   Dry eye syndrome of both eyes 05/19/2017   Primary open angle glaucoma (POAG) of left eye, mild stage 05/19/2017   Radiation retinopathy, 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   Secondary glaucoma due to combination mechanisms, right, severe stage 05/19/2017   Fatigue 01/21/2017   Ankle pain, left 01/21/2017   Fall (on) (from) other stairs and steps, initial encounter 11/11/2016   Abrasion of head 11/11/2016   Ascending aortic aneurysm 07/23/2016   Erectile dysfunction 07/19/2016   Bronchiectasis (HCC) 04/23/2016   Chronic bronchitis (HCC) 03/11/2016   Cerumen impaction 08/04/2015   Bilateral impacted cerumen 08/04/2015   Cervical disc disorder with radiculopathy of cervical region 06/06/2014   Elevated WBC count 06/06/2014   Iron  deficiency anemia 06/06/2014   Edema 02/21/2014   Tinnitus 02/21/2014   Well adult exam 05/31/2013   Glaucoma 05/31/2013   RML pneumonia 03/31/2013   Hypertension, uncontrolled 02/25/2013   Anxiety disorder 08/08/2010   Chest pain, atypical 02/08/2010   ESOPHAGEAL STRICTURE 11/30/2009   Osteoarthritis 08/07/2009   Malaise and fatigue 05/14/2008   Coronary atherosclerosis 11/04/2007   Lumbago 11/04/2007   THYROIDECTOMY, HX OF 11/04/2007   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    PCP: Garald Scrape MD    REFERRING PROVIDER: Melita Drivers MD   REFERRING DIAG: Diagnosis Z96.611 (ICD-10-CM) - Presence of right artificial shoulder joint  THERAPY DIAG:  Acute pain of right shoulder  Stiffness of right shoulder, not elsewhere classified  Muscle weakness (generalized)  Rationale for Evaluation and Treatment: Rehabilitation  ONSET DATE: Reverse TSA 07/01/24  SUBJECTIVE:                                                                                                                                                                                      SUBJECTIVE STATEMENT:  Some movements are painful-performed abd + slight IR.  Was very busy over the holidays so worked exercise at home.   Hand dominance: Right  PERTINENT HISTORY: See above   PAIN:  Are you having pain? Yes: NPRS scale: 0/10 Pain location: R shoulder Pain description: sharp, numbness/tingling going down into forearm  Aggravating factors: IR of UE, reaching for things, pulling covers over himself  Relieving factors: medicine and ice   PRECAUTIONS: Shoulder and Other: poor vision R eye, active CA found recently but not sure if he will go back to chemo or radiation yet   RED FLAGS: None   WEIGHT BEARING  RESTRICTIONS: No  FALLS:  Has patient fallen in last 6 months? Yes. Number of falls 1- fainted at breakfast table   LIVING ENVIRONMENT: Lives with: lives with their family Lives in: House/apartment   OCCUPATION: On disability   PLOF: Independent, Independent with basic ADLs, Independent with gait, and Independent with transfers  PATIENT GOALS:get full use of shoulder and address pain  NEXT MD VISIT:   OBJECTIVE:  Note: Objective measures were completed at Evaluation unless otherwise noted.   PATIENT SURVEYS:  PSFS: THE PATIENT SPECIFIC FUNCTIONAL SCALE  Place score of 0-10 (0 = unable to perform activity and 10 = able to perform activity at the same level as before injury or problem)  Activity  Date: 07/30/24 Eval    Dressing (tucking in shirt, buckling belt, etc) 3    2. Reaching behind the back  2    3. Reaching forward for something  4    4.      Total Score 3      Total Score = Sum of activity scores/number of activities  Minimally Detectable Change: 3 points (for single activity); 2 points (for average score)  [facility] (nd). The Patient Specific Functional Scale . Retrieved from Skateoasis.com.pt   COGNITION: Overall cognitive status: Within functional limits for tasks assessed     SENSATION: Numbness medial forearm   POSTURE: Rounded shoulders, forward head   UPPER EXTREMITY ROM:    ROM Right eval  Shoulder flexion 110* PROM, AAROM 140*  Shoulder extension   Shoulder abduction 90* PROM  Shoulder adduction   Shoulder internal rotation   Shoulder external rotation 30* PROM directly by his side   Elbow flexion   Elbow extension   Wrist flexion   Wrist extension   Wrist ulnar deviation   Wrist radial deviation   Wrist pronation   Wrist supination   (Blank rows = not tested)  UPPER EXTREMITY MMT:  MMT Right eval Left eval  Shoulder flexion    Shoulder extension    Shoulder abduction    Shoulder adduction    Shoulder internal rotation    Shoulder external rotation    Middle trapezius    Lower trapezius    Elbow flexion    Elbow extension    Wrist flexion    Wrist extension    Wrist ulnar deviation    Wrist radial deviation    Wrist pronation    Wrist supination    Grip strength (lbs)    (Blank rows = not tested)  MMT not done at eval                                                                                                                                TREATMENT DATE:    1/14: PROM STM to biceps, periscap mm, post deltoid Supine cane flexion x10 Supine active flexion x10 Sidelying short arc abd x10 (felt better after STM) Seated scap squeeze 3  x10 Pulleys each flexion and scaption Isometrics flex/abd/ext 5 x10ea HEP update/review  Treatment                            08/19/24: Blank lines following charge title = not provided on this treatment date.   Manual:  TPDN No STM upper traps, levator Passive flexion STM lats There-ex: Supine shoulder flexion with deep breathing Seated scapular retraction There-Act: Discussed functional activities such as undressing, resting positions, sleeping Self Care:  Nuro-Re-ed:  Gait Training:     PATIENT EDUCATION: Education details: exam findings, POC, HEP, general course of care and expectations for rehab for reverse TSA, wrist exercises to be performed in elevation to help with edema  Person educated: Patient Education method: Explanation, Demonstration, and Handouts Education comprehension: verbalized understanding, returned demonstration, and needs further education  HOME EXERCISE PROGRAM: Access Code: [email or web address] URL: [email or web address] Date: 07/30/2024 Prepared by: Josette Rough    ASSESSMENT:  CLINICAL IMPRESSION: Pt with overall good ROM observed passively, most limited into abduction and IR. Spent time on STM to decrease soft tissue restrictions in periscpaular area as well as posterior deltoid and bicep. He reported improved tolerance to exercise following MT. Progressed AAROM and AROM today with good tolerance, though pt challenged by supine active flexion. Educated in modification for this to decrease discomfort. Performed isometrics targeting deltoid with good tolerance. Updated HEP to include isometrics and AAROM/AROM progressions. Educated on use of ice at home. Will progress as tolerated per protocol.    OBJECTIVE IMPAIRMENTS: decreased ROM, decreased strength, hypomobility, increased edema, increased fascial restrictions, increased muscle spasms, impaired flexibility, impaired sensation, impaired UE functional use, improper body mechanics,  postural dysfunction, and pain.   ACTIVITY LIMITATIONS: carrying, lifting, bathing, toileting, dressing, self feeding, reach over head, hygiene/grooming, and caring for others  PARTICIPATION LIMITATIONS: meal prep, cleaning, laundry, driving, shopping, community activity, and yard work  PERSONAL FACTORS: Age, Behavior pattern, Fitness, Past/current experiences, Social background, and Time since onset of injury/illness/exacerbation are also affecting patient's functional outcome.   REHAB POTENTIAL: Good  CLINICAL DECISION MAKING: Evolving/moderate complexity  EVALUATION COMPLEXITY: Moderate   GOALS: Goals reviewed with patient? No  SHORT TERM GOALS: Target date: 09/10/2024    Patient will be compliant with appropriate progressive HEP        GOAL STATUS: Initial  2. R shoulder flexion and ABD A/PROM to be at least 160*         GOAL STATUS: Initial    3. R shoulder ER A/PROM to be at least 60*, will be able to reach L5 FIR level functionally           GOAL STATUS: Initial    4. Will be more aware of posture with all functional tasks with use of ergonomic aides PRN/as desired      GOAL STATUS: Initial   5. Edema and sensory impairments in R UE to have resolved GOAL STATUS: Initial    LONG TERM GOALS: Target date: 10/22/2024    MMT to have improved by at least one grade in all weak groups       GOAL STATUS: Initial    2. AROM to have normalized and will be pain free all planes of motion      GOAL STATUS: Initial    3. Pain to be no more than 2/10 with all functional tasks     GOAL STATUS: Initial   4. Will be able to perform all functional household  and work based tasks without increase from resting pain levels     GOAL STATUS: Initial   5. PSFS to have improved by at least 3 points to show improved QOL and subjective perception of condition      GOAL STATUS: Initial   6. Will be able to perform all dressing activities, including doing belt and tucking his shirt  in behind him without difficulty  GOAL STATUS: Initial   PLAN:  PT FREQUENCY: 2x/week  PT DURATION: 12 weeks  PLANNED INTERVENTIONS: 97164- PT Re-evaluation, 97750- Physical Performance Testing, 97110-Therapeutic exercises, 97530- Therapeutic activity, [id number]- Neuromuscular re-education, 97535- Self Care, [id number]- Manual therapy, [id number]- Aquatic Therapy, [id number]- Electrical stimulation (unattended), 97016- Vasopneumatic device, [id number]- Ultrasound, and 97033- Ionotophoresis 4mg /ml Dexamethasone   PLAN FOR NEXT SESSION: per reverse TSA protocol (from Dr. Melita, did not have formal protocol so used [facility] protocol below); keep in mind long head of biceps was tenodesed   [email or web address]  Asberry Rodes, PTA  08/25/2024 10:43 AM   "

## 2024-08-26 ENCOUNTER — Inpatient Hospital Stay: Attending: Hematology & Oncology | Admitting: Hematology & Oncology

## 2024-08-26 ENCOUNTER — Other Ambulatory Visit: Payer: Self-pay

## 2024-08-26 ENCOUNTER — Encounter: Payer: Self-pay | Admitting: Hematology & Oncology

## 2024-08-26 ENCOUNTER — Inpatient Hospital Stay

## 2024-08-26 VITALS — BP 161/97 | HR 72 | Temp 97.6°F | Resp 18 | Ht 72.0 in | Wt 188.0 lb

## 2024-08-26 DIAGNOSIS — D839 Common variable immunodeficiency, unspecified: Secondary | ICD-10-CM | POA: Insufficient documentation

## 2024-08-26 DIAGNOSIS — Z85828 Personal history of other malignant neoplasm of skin: Secondary | ICD-10-CM | POA: Diagnosis not present

## 2024-08-26 DIAGNOSIS — Z85850 Personal history of malignant neoplasm of thyroid: Secondary | ICD-10-CM | POA: Diagnosis not present

## 2024-08-26 NOTE — Progress Notes (Signed)
 " Hematology and Oncology Follow Up Visit  Alex [name] [facility] [id number] 18-Jan-1956 69 y.o. 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  Current Therapy:   IVIG 40 g IV monthly IV iron -Venofer  given on 11/28/2022     Interim History:  Mr. Golda is back for follow-up.  We actually saw him back in July.  Since then, he has been quite busy.  He had shoulder surgery and shoulder replacement about 7 weeks ago.  He has done a lot of physical therapy.  He actually has fairly decent range of motion.  He also was found to have a nodule in the prostate.  He had prostate biopsies at [facility].  Thankfully, nothing should was shown to be conclusively positive.  He may have had 1 biopsy had some dysplasia.  He will go back to [facility] for analysis.  I would think that this would be some that would be followed.  He  says that he had a normal PSA.  With the labs that he showed me, his PSA was 1.2.  He still getting the IVIG treatments at home.  His recent IgG level was quite high at 1087 mg/dL.  He still gets the injections in the eye for his macular degeneration.  Overall, I must say  that he does look quite good.  I am happy for him.  Overall, I would say that his performance status is probably ECOG 1.    Medications:  Current Outpatient Medications:    amLODipine  (NORVASC ) 5 MG tablet, Take 5 mg by mouth daily., Disp: , Rfl:    acetaminophen  (TYLENOL ) 500 MG tablet, Take by mouth., Disp: , Rfl:    aspirin  EC 81 MG tablet, Take 1 tablet by mouth daily., Disp: , Rfl:    atorvastatin  (LIPITOR) 40 MG tablet, TAKE 1 TABLET BY MOUTH EVERY DAY (Patient taking differently: Take 40 mg by mouth at bedtime.), Disp: 90 tablet, Rfl: 3   Coenzyme Q10 (CO Q 10 PO), Take 1 capsule by mouth daily. , Disp: , Rfl:    erythromycin ophthalmic ointment, Place 1 Application into the right eye daily as needed  (conjunctivitis)., Disp: , Rfl:    HIZENTRA  4 GM/20ML SOSY, Inject 1 Dose into the vein once a week., Disp: , Rfl:    LamoTRIgine  300 MG TB24 24 hour tablet, Take 1 tablet (300 mg total) by mouth every morning., Disp: 90 tablet, Rfl: 0   latanoprost (XALATAN) 0.005 % ophthalmic solution, Place 1 drop into both eyes at bedtime., Disp: , Rfl:    levothyroxine  (SYNTHROID ) 150 MCG tablet, TAKE 1 TABLET BY MOUTH DAILY BEFORE BREAKFAST. (Patient taking differently: Take 150 mcg by mouth at bedtime.), Disp: 90 tablet, Rfl: 2   meloxicam  (MOBIC ) 15 MG tablet, Take 1 tablet (15 mg total) by mouth daily as needed., Disp: 90 tablet, Rfl: 0   Multiple Vitamin (MULTI-VITAMINS) TABS, Take 1 tablet by mouth daily., Disp: , Rfl:    nitroGLYCERIN  (NITROLINGUAL ) 0.4 MG/SPRAY spray, Place 1 spray under the tongue every 5 (five) minutes x 3 doses as needed for chest pain. Esophageal spasms, Disp: 12 g, Rfl: 12   OMEGA-3 KRILL OIL PO, Take 1 capsule by mouth daily. , Disp: , Rfl:    pantoprazole  (PROTONIX ) 40 MG tablet, Take 1 tablet (40 mg total) by mouth daily., Disp: 90 tablet, Rfl: 11   propranolol  (INDERAL ) 40 MG tablet, Take 1 tablet (40 mg total) by  mouth 2 (two) times daily., Disp: 180 tablet, Rfl: 2   sildenafil  (REVATIO ) 20 MG tablet, Take 1 tablet (20 mg total) by mouth as needed., Disp: 30 tablet, Rfl: 1   tadalafil  (CIALIS ) 5 MG tablet, Take 1 tablet (5 mg total) by mouth daily., Disp: 90 tablet, Rfl: 3   tamsulosin  (FLOMAX ) 0.4 MG CAPS capsule, Take 1 capsule (0.4 mg total) by mouth daily., Disp: 90 capsule, Rfl: 1   telmisartan  (MICARDIS ) 80 MG tablet, Take 1 tablet (80 mg total) by mouth daily., Disp: 90 tablet, Rfl: 2   Testosterone  20.25 MG/ACT (1.62%) GEL, Apply 2 Pump topically daily., Disp: , Rfl:    timolol (TIMOPTIC) 0.5 % ophthalmic solution, timolol maleate 0.5 % eye drops  INSTILL 1 DROP INTO RIGHT EYE TWICE A DAY, Disp: , Rfl:    zolpidem  (AMBIEN ) 10 MG tablet, Take 1 tablet (10 mg total) by  mouth at bedtime as needed for sleep., Disp: 15 tablet, Rfl: 2  Allergies:  Allergies  Allergen Reactions   Oxycodone -Acetaminophen  Hives     Past Medical History, Surgical history, Social history, and Family History were reviewed and updated.  Review of Systems: Review of Systems  Constitutional: Negative.   HENT:  Negative.    Eyes: Negative.   Respiratory: Negative.    Cardiovascular: Negative.   Gastrointestinal: Negative.   Endocrine: Negative.   Genitourinary: Negative.    Musculoskeletal: Negative.   Skin: Negative.   Neurological: Negative.   Hematological: Negative.   Psychiatric/Behavioral: Negative.      Physical Exam:  height is 6' (1.829 m) and weight is 188 lb (85.3 kg). His oral temperature is 97.6 F (36.4 C). His blood pressure is 161/97 (abnormal) and his pulse is 72. His respiration is 18 and oxygen saturation is 97%.   Wt Readings from Last 3 Encounters:  08/26/24 188 lb (85.3 kg)  08/11/24 198 lb 14.4 oz (90.2 kg)  07/01/24 184 lb (83.5 kg)    Physical Exam Vitals reviewed.  HENT:     Head: Normocephalic and atraumatic.  Eyes:     Pupils: Pupils are equal, round, and reactive to light.  Cardiovascular:     Rate and Rhythm: Normal rate and regular rhythm.     Heart sounds: Normal heart sounds.  Pulmonary:     Effort: Pulmonary effort is normal.     Breath sounds: Normal breath sounds.  Abdominal:     General: Bowel sounds are normal.     Palpations: Abdomen is soft.  Musculoskeletal:        General: No tenderness or deformity. Normal range of motion.     Cervical back: Normal range of motion.  Lymphadenopathy:     Cervical: No cervical adenopathy.  Skin:    General: Skin is warm and dry.     Findings: No erythema or rash.     Comments: In the right trapezius region, he has a nodule that probably measures about 7 x 7 mm.  It is red.  It is slightly firm but has slight fluctuance.  Neurological:     Mental Status: He is alert and  oriented to person, place, and time.  Psychiatric:        Behavior: Behavior normal.        Thought Content: Thought content normal.        Judgment: Judgment normal.      Lab Results  Component Value Date   WBC 11.8 (H) 06/01/2024   HGB 14.7 06/01/2024   HCT 45.1  06/01/2024   MCV 83.1 06/01/2024   PLT 219 06/01/2024     Chemistry      Component Value Date/Time   NA 137 06/01/2024 1024   NA 142 03/12/2023 1200   NA 139 06/27/2017 0950   K 4.9 06/01/2024 1024   K 4.0 06/27/2017 0950   CL 102 06/01/2024 1024   CO2 25 06/01/2024 1024   CO2 23 06/27/2017 0950   BUN 19 06/01/2024 1024   BUN 19 03/12/2023 1200   BUN 19.2 06/27/2017 0950   CREATININE 1.22 06/01/2024 1024   CREATININE 1.04 03/31/2023 1442   CREATININE 1.1 06/27/2017 0950      Component Value Date/Time   CALCIUM  8.7 (L) 06/01/2024 1024   CALCIUM  8.8 06/27/2017 0950   ALKPHOS 90 06/01/2024 1024   ALKPHOS 74 06/27/2017 0950   AST 32 06/01/2024 1024   AST 20 03/31/2023 1442   AST 26 06/27/2017 0950   ALT 25 06/01/2024 1024   ALT 28 03/31/2023 1442   ALT 40 06/27/2017 0950   BILITOT 0.7 06/01/2024 1024   BILITOT 0.5 03/31/2023 1442   BILITOT 1.05 06/27/2017 0950      Impression and Plan: Mr. Whittenburg is a very nice 69 year old male.  He has multiple problems.  He has had a history of periorbital squamous cell carcinoma.  This was treated.  He had thyroid  cancer.  This also was treated and should not be a problem.  Again, from my point of view, I think everything is doing quite well for him.  I am happy that he got through his shoulder surgery without any infections.  Hopefully, the prostate will just be under active surveillance.  I think we still follow him up every 6 months.  I know he goes out to [facility] for some of his care.  Obviously, they are doing a great job.   Maude JONELLE Crease, MD 1/15/20262:07 PM "

## 2024-08-31 ENCOUNTER — Encounter (HOSPITAL_COMMUNITY): Payer: Self-pay

## 2024-08-31 ENCOUNTER — Ambulatory Visit (HOSPITAL_COMMUNITY): Admitting: Licensed Clinical Social Worker

## 2024-09-01 ENCOUNTER — Ambulatory Visit (HOSPITAL_BASED_OUTPATIENT_CLINIC_OR_DEPARTMENT_OTHER): Payer: Self-pay

## 2024-09-01 ENCOUNTER — Other Ambulatory Visit (HOSPITAL_COMMUNITY): Payer: Self-pay

## 2024-09-01 ENCOUNTER — Encounter (HOSPITAL_BASED_OUTPATIENT_CLINIC_OR_DEPARTMENT_OTHER): Payer: Self-pay

## 2024-09-01 ENCOUNTER — Encounter: Payer: Self-pay | Admitting: Family

## 2024-09-01 DIAGNOSIS — M25611 Stiffness of right shoulder, not elsewhere classified: Secondary | ICD-10-CM

## 2024-09-01 DIAGNOSIS — M25511 Pain in right shoulder: Secondary | ICD-10-CM

## 2024-09-01 DIAGNOSIS — M6281 Muscle weakness (generalized): Secondary | ICD-10-CM

## 2024-09-01 NOTE — Therapy (Signed)
 " OUTPATIENT PHYSICAL THERAPY SHOULDER EVALUATION   Patient Name: Alex [name] MRN: [id number] DOB:02-Sep-1955, 69 y.o., male Today's Date: 09/01/2024  END OF SESSION:  PT End of Session - 09/01/24 1300     Visit Number 4    Number of Visits 25    Date for Recertification  10/22/24    Authorization Type [employer]    PT Start Time 1303    PT Stop Time 1346    PT Time Calculation (min) 43 min    Activity Tolerance Patient tolerated treatment well    Behavior During Therapy [facility] for tasks assessed/performed            Past Medical History:  Diagnosis Date   Anemia    Anxiety    Atypical nevus 04/12/1997   dyplastic-left chest below nipple   Atypical nevus 01/18/2005   slight-mod-mid upper abd, slight-mod-right lateral chest-(WS), slight-mod-mid lower back (punch)   Atypical nevus 05/31/2005   dysplastic-central lowerback (exc), dysplastic- right abdomen (Exc)   Basal cell carcinoma 06/04/2016   back of neck   Bipolar I disorder (HCC)    Bleeding ulcer 2016   BPH (benign prostatic hypertrophy) with urinary obstruction    Cancer (HCC)    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 Maude Halt, oncology. Receives monthly IVIG 40 grams.   Depression    Epicondylitis    right elbow   GERD (gastroesophageal reflux disease)    Glaucoma BOTH EYES   RIGHT EYE RADIATION DAMAGE   Hearing loss    Bilateral   Hepatic cyst    Several, The lesion of concern in segment 6 of the  liver has single large portal vein and hepatic vein branches extending to tt, in a pattern of enhancement which mirrors these vascular structures. 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 9 CM X 4. 3 CM PER NOV 06-14-17  CHEST CTFOLOWED BY DR PIETRO YEARLY FOR   Positional vertigo  HX OF WITH SINUS INFECTIONS   Prostate cancer [facility])    Follows w/ Dr. Debby Polascik @ [facility].   Radial head fracture    Right   Squamous cell carcinoma of skin 06/04/2016   in situ-crown of scalp   Squamous cell carcinoma of skin 04/22/2017   in situ-crown scalp (txpbx)   Thoracic aortic aneurysm 06/14/2017   last CT 4.1 CM Mild   Tinnitus    CONSTANT   Ulnar nerve compression    right elbow   Unsteady gait    especially with stairs, depth perception off   Urinary hesitancy    Wears glasses    Past Surgical History:  Procedure Laterality Date   CARDIAC CATHETERIZATION  01-16-2006  DR DEBBY WALL   MILD CORONARY ATHEROSCLEROSIS/ MID TO DISTAL LAD 40% STENOSIS/ LVF 50-55%   CARPAL TUNNEL RELEASE Right 11/03/2017   Procedure: RIGHT HAND CARPAL TUNNEL RELEASE;  Surgeon: Shari Easter, MD;  Location: [facility] [place];  Service: Orthopedics;  Laterality: Right;   CATARACT EXTRACTION Right    COLONSCOPY  2017 LAST DONE   MULTIPLE   CYST EXCISION Right 09/04/2023   Procedure: EXCISION CYST RIGHT SHOULDER;  Surgeon: Eletha Boas, MD;  Location: [facility] [place];  Service: General;  Laterality: Right;  LOCAL & MAC   ENDOSCOPY  LAST 2017   MULTIPLE DONE DILATION DONE ALSO   ESOPHAGOGASTRODUODENOSCOPY (EGD) WITH PROPOFOL  N/A 02/24/2018   Procedure: ESOPHAGOGASTRODUODENOSCOPY (EGD) WITH PROPOFOL ;  Surgeon: Celestia Agent, MD;  Location: [facility];  Service: Endoscopy;  Laterality: N/A;   EXCISION RADIAL HEAD Right 11/03/2017   Procedure: RIGHT PROXIMAL RADIUS RADIAL HEAD RESECTION AND JOINT DEBRIDEMENT;  Surgeon: Shari Easter, MD;  Location: [facility] [place];  Service: Orthopedics;  Laterality: Right;   EXTRACORPOREAL SHOCK WAVE LITHOTRIPSY Right 11/20/2020   Procedure: EXTRACORPOREAL SHOCK WAVE LITHOTRIPSY (ESWL);  Surgeon: Alvaro Hummer, MD;  Location: [facility];  Service: Urology;  Laterality: Right;  75 MINS   KNEE ARTHROSCOPY  05/01/2012   Procedure: ARTHROSCOPY KNEE;  Surgeon: Reyes JAYSON Billing, MD;  Location: [facility];  Service: Orthopedics;  Laterality: Left;  debridement and removal of loose body   LEFT ANKLE ARTHROSCOPY W/ DEBRIDEMENT  05/12/2007   LEFT HYDROCELECTOMY  03/29/2005   AND REPAIR LEFT INGUINAL HERNIA W/ MESH   MOHS SURGERY  2002   RIGHT ORBITAL CANCER   NASAL ENDOSCOPY  08/07/2005   RIGHT EPISTAXIS  / POST SEPTOPLASTY  (HX RIGHT ORBITAL CA & S/P RADIATION/ NECROSIS ANTERIOR END OF BOTH INFERIOR TURBINATES)   occuloplastic surgery  2002   PARS PLANA VITRECTOMY  11/06/2004   RIGHT EYE RADIATION RETINOPATHY W/ HEMORRHAGE   PROSTATE ABLATION N/A 06/2022   RADIAL HEAD ARTHROPLASTY Right 06/15/2018   Procedure: RIGHT ELBOW PROXIMAL RADIOULNAR JOINT DEBRIDEMENT AND ARTHROPLASTY;  Surgeon:  Shari Easter, MD;  Location: [facility];  Service: Orthopedics;  Laterality: Right;  BLOCK WITH SEDATION   REPAIR UNDESENDED RIGHT TESTICLE / RIGHT INGUINAL HERNIA  AGE 19   REVERSE SHOULDER ARTHROPLASTY Right 07/01/2024   Procedure: ARTHROPLASTY, SHOULDER, TOTAL, REVERSE;  Surgeon: Melita Drivers, MD;  Location: [facility];  Service: Orthopedics;  Laterality: Right;    RIGHT ANKLE ARTHROSCOPY W/ EXTENSIVE DEBRIDEMENT  04/05/2008   x2   RIGHT SHOULDER SURGERY  2006   RIGHT SUPRAOMOHYOID NECK DISSECTION   03/08/2003   ZONES 1,2,3;   SUBMANDIBULAR MASS / METASTATIC SQUAMOUS CELL CARCINOMA RIGHT NECK   SAVORY DILATION  N/A 02/24/2018   Procedure: SAVORY DILATION;  Surgeon: Celestia Agent, MD;  Location: [facility];  Service: Endoscopy;  Laterality: N/A;   SEPTOPLASTY  06/2005   SHOULDER ARTHROSCOPY Left    SHOULDER ARTHROSCOPY W/ SUBACROMIAL DECOMPRESSION AND DISTAL CLAVICLE EXCISION  10/09/2008   AND DEBRIDEMENT OF RIGHT SHOULDER IMPINGEMENT & AC JOINT ARTHRITIS   SPINE SURGERY  2016   l 3 TO l 4 PLATE AND SCREWS   TOTAL THYROIDECTOMY  11/03/2001   PAPILLARY THYROID  CARCINOMA   TRANSTHORACIC ECHOCARDIOGRAM  07/2011   grade I diastolic dysfunction/ ef 55-60%   ULNAR NERVE TRANSPOSITION Right 04/28/2014   Procedure: RIGHT ELBOW ULNA NERVE RELEASE TRANSPOSTION AND MEDIAL EPICONDYLAR DEBRIDEMENT AND REPAIR;  Surgeon: Prentice LELON Pagan, MD;  Location: [facility];  Service: Orthopedics;  Laterality: Right;   Patient Active Problem List   Diagnosis Date Noted   Shoulder arthritis 06/23/2024   Bladder neck obstruction 06/23/2024   Esophageal spasm 04/01/2024   GERD (gastroesophageal reflux disease) 04/01/2024   Memory changes 04/01/2024   Primary osteoarthritis of both knees 09/22/2023   Boil 08/26/2023   Abscess 08/20/2023   Cellulitis 05/12/2023   Bruise of lower lip 04/17/2023   Wheezing 03/31/2023   Injury of medial collateral ligament (MCL) of knee  02/07/2023   Sprain of medial collateral ligament of right knee 02/07/2023   Osteoarthritis of subtalar joint 09/25/2022   Cystoid macular edema of right eye 07/18/2022   Hyperglycemia 07/18/2022   Nonexudative age-related macular degeneration, left eye, early dry stage 07/18/2022   Retinal edema 07/18/2022   Superficial punctate keratitis of right eye 07/18/2022   Grief at loss of child 05/22/2022   Actinic keratoses 05/22/2022   Pain in joint of left elbow 04/04/2022   Abnormal defecation 02/26/2022   Atrophic gastritis 02/26/2022   Bipolar 1 disorder (HCC) 02/26/2022   BPH (benign prostatic hyperplasia) 02/26/2022   Bronchiolectasis (HCC) 02/26/2022   Disease of thyroid  gland 02/26/2022   Diverticular disease of colon 02/26/2022   Family history of malignant neoplasm of digestive organs 02/26/2022   Hyperlipidemia 02/26/2022   IgG deficiency (HCC) 02/26/2022   Immune deficiency disorder 02/26/2022   Irritable bowel syndrome 02/26/2022   Pancreatic cyst 02/26/2022   History of colonic polyps 02/26/2022   Portosystemic shunt, 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/26/2022   Pyloric ulcer 02/26/2022   Scoliosis 02/26/2022   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   Spinal stenosis of lumbar region 02/26/2022   Prostate cancer (HCC) 02/26/2022   Dysuria 10/03/2021   Earache 06/14/2021   Anemia 06/14/2021   History of head and neck cancer 03/28/2021   Swelling of right parotid gland 03/28/2021   Epistaxis, recurrent 06/26/2020   PMR (polymyalgia rheumatica) 02/22/2020   Diverticulitis 01/25/2020   Abdominal pain 01/25/2020   Iliac vessel injury 11/30/2019   Stress at home 09/14/2019   LLQ abdominal pain 09/06/2019   IPMN (intraductal papillary mucinous neoplasm) 07/19/2019   History of thyroid  cancer 06/03/2019   Abnormal findings on diagnostic imaging of liver 06/03/2019   Epididymitis 03/16/2019   Difficulty urinating 03/16/2019   Pain in right foot  03/12/2019   Hypothyroidism (acquired) 09/29/2018   Moderate persistent asthma with acute exacerbation 09/29/2018   Prediabetes 09/29/2018   RTI (respiratory tract infection) 09/29/2018   ETD (Eustachian tube dysfunction), bilateral 09/02/2018   Sensorineural hearing loss (SNHL) of both ears 07/31/2018   Bruising 11/17/2017   Encounter for other orthopedic aftercare 11/14/2017   Cicatricial lagophthalmos of left lower eyelid  10/31/2017   Closed fracture of head of right radius 10/24/2017   Palatal mass 09/16/2017   Sore throat 09/16/2017   Testicular pain, right 08/01/2017   Combined form of age-related cataract, left eye 05/19/2017   Dry eye syndrome of both eyes 05/19/2017   Primary open angle glaucoma (POAG) of left eye, mild stage 05/19/2017   Radiation retinopathy, 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   Secondary glaucoma due to combination mechanisms, right, severe stage 05/19/2017   Fatigue 01/21/2017   Ankle pain, left 01/21/2017   Fall (on) (from) other stairs and steps, initial encounter 11/11/2016   Abrasion of head 11/11/2016   Ascending aortic aneurysm 07/23/2016   Erectile dysfunction 07/19/2016   Bronchiectasis (HCC) 04/23/2016   Chronic bronchitis (HCC) 03/11/2016   Cerumen impaction 08/04/2015   Bilateral impacted cerumen 08/04/2015   Cervical disc disorder with radiculopathy of cervical region 06/06/2014   Elevated WBC count 06/06/2014   Iron  deficiency anemia 06/06/2014   Edema 02/21/2014   Tinnitus 02/21/2014   Well adult exam 05/31/2013   Glaucoma 05/31/2013   RML pneumonia 03/31/2013   Hypertension, uncontrolled 02/25/2013   Anxiety disorder 08/08/2010   Chest pain, atypical 02/08/2010   ESOPHAGEAL STRICTURE 11/30/2009   Osteoarthritis 08/07/2009   Malaise and fatigue 05/14/2008   Coronary atherosclerosis 11/04/2007   Lumbago 11/04/2007   THYROIDECTOMY, HX OF 11/04/2007   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    PCP: Garald Scrape MD   REFERRING  PROVIDER: Melita Drivers MD   REFERRING DIAG: Diagnosis Z96.611 (ICD-10-CM) - Presence of right artificial shoulder joint  THERAPY DIAG:  Muscle weakness (generalized)  Stiffness of right shoulder, not elsewhere classified  Acute pain of right shoulder  Rationale for Evaluation and Treatment: Rehabilitation  ONSET DATE: Reverse TSA 07/01/24  SUBJECTIVE:                                                                                                                                                                                      SUBJECTIVE STATEMENT:  Pt reports his shoulder hurts in the front after putting weight on it when trying to help his son pull a cable.   Hand dominance: Right  PERTINENT HISTORY: See above   PAIN:  Are you having pain? Yes: NPRS scale: 3/10 Pain location: R shoulder Pain description: sharp, numbness/tingling going down into forearm  Aggravating factors: IR of UE, reaching for things, pulling covers over himself  Relieving factors: medicine and ice   PRECAUTIONS: Shoulder and Other: poor vision R eye, active CA found recently but not sure if he will go back to chemo or radiation yet   RED FLAGS: None   WEIGHT BEARING RESTRICTIONS: No  FALLS:  Has patient fallen in last 6 months? Yes. Number of falls 1- fainted at breakfast table   LIVING ENVIRONMENT: Lives with: lives with their family Lives in: House/apartment   OCCUPATION: On disability   PLOF: Independent, Independent with basic ADLs, Independent with gait, and Independent with transfers  PATIENT GOALS:get full use of shoulder and address pain  NEXT MD VISIT:   OBJECTIVE:  Note: Objective measures were completed at Evaluation unless otherwise noted.   PATIENT SURVEYS:  PSFS: THE PATIENT SPECIFIC FUNCTIONAL SCALE  Place score of 0-10 (0 = unable to perform activity and 10 = able to perform activity at the same level as before injury or problem)  Activity Date: 07/30/24 Eval     Dressing (tucking in shirt, buckling belt, etc) 3    2. Reaching behind the back  2    3. Reaching forward for something  4    4.      Total Score 3      Total Score = Sum of activity scores/number of activities  Minimally Detectable Change: 3 points (for single activity); 2 points (for average score)  [facility] (nd). The Patient Specific Functional Scale . Retrieved from [email or web address]   COGNITION: Overall cognitive status: Within functional limits for tasks assessed     SENSATION: Numbness medial forearm   POSTURE: Rounded shoulders, forward head   UPPER EXTREMITY ROM:    ROM Right eval  Shoulder flexion 110* PROM, AAROM 140*  Shoulder extension   Shoulder abduction 90* PROM  Shoulder adduction   Shoulder internal rotation   Shoulder external rotation 30* PROM directly by his side   Elbow flexion   Elbow extension   Wrist flexion   Wrist extension   Wrist ulnar deviation   Wrist radial deviation   Wrist pronation   Wrist supination   (Blank rows = not tested)  UPPER EXTREMITY MMT:  MMT Right eval Left eval  Shoulder flexion    Shoulder extension    Shoulder abduction    Shoulder adduction    Shoulder internal rotation    Shoulder external rotation    Middle trapezius    Lower trapezius    Elbow flexion    Elbow extension    Wrist flexion    Wrist extension    Wrist ulnar deviation    Wrist radial deviation    Wrist pronation    Wrist supination    Grip strength (lbs)    (Blank rows = not tested)  MMT not done at eval                                                                                                                                TREATMENT DATE:   1/21: PROM STM to biceps, periscap mm, post deltoid Supine cane flexion x10 Supine active flexion x10 Sidelying short arc abd x10 (required manual cuing) Sidelying ER 2x10 Seated scap squeeze 3 x10 Bent  over row 0# 2x10 Pulleys each flexion and scaption Isometrics flex/abd/ext 5 x10ea    1/14: PROM STM to biceps, periscap mm, post deltoid Supine cane flexion x10 Supine active flexion x10 Sidelying short arc abd x10 (felt better after STM) Seated scap squeeze 3 x10 Pulleys each flexion and scaption Isometrics flex/abd/ext 5 x10ea HEP update/review  Treatment                            08/19/24: Blank lines following charge title = not provided on this treatment date.   Manual:  TPDN No STM upper traps, levator Passive flexion STM lats There-ex: Supine shoulder flexion with deep breathing Seated scapular retraction There-Act: Discussed functional activities such as undressing, resting positions, sleeping Self Care:  Nuro-Re-ed:  Gait Training:     PATIENT EDUCATION: Education details: exam findings, POC, HEP, general course of care and expectations for rehab for reverse TSA, wrist exercises to be performed in elevation to help with edema  Person educated: Patient Education method: Explanation, Demonstration, and Handouts Education comprehension: verbalized understanding, returned demonstration, and needs further education  HOME EXERCISE PROGRAM: Access Code: [email or web address] URL: [email or web address] Date: 07/30/2024 Prepared by: Josette Rough    ASSESSMENT:  CLINICAL IMPRESSION: Continued with PROM to improve GHJ mobility in all planes. Good tolerance for this. He did have some increase in biceps area discomfort with active flexion in supine which improved after given modifications. Did not progress to resisted AROM today due to biceps discomfort. Able to work on s/l short arc abduction (requires tactile cues/assist) and ER. Instructed pt to continue to work on isometric strengthening at home. Will assess pain level and progress as tolerated next visit.   OBJECTIVE IMPAIRMENTS: decreased ROM, decreased strength, hypomobility, increased  edema, increased fascial restrictions, increased muscle spasms, impaired flexibility, impaired sensation, impaired UE functional use, improper body mechanics, postural dysfunction, and pain.   ACTIVITY LIMITATIONS: carrying, lifting, bathing, toileting, dressing, self feeding, reach over head, hygiene/grooming, and caring for others  PARTICIPATION LIMITATIONS: meal prep, cleaning, laundry, driving, shopping, community activity, and yard work  PERSONAL FACTORS: Age, Behavior pattern, Fitness, Past/current experiences, Social background, and Time since onset of injury/illness/exacerbation are also affecting patient's functional outcome.   REHAB POTENTIAL: Good  CLINICAL DECISION MAKING: Evolving/moderate complexity  EVALUATION COMPLEXITY: Moderate   GOALS: Goals reviewed with patient? No  SHORT TERM GOALS: Target date: 09/10/2024    Patient will be compliant with appropriate progressive HEP        GOAL STATUS: Initial  2. R shoulder flexion and ABD A/PROM to be at least 160*         GOAL STATUS: Initial    3. R shoulder ER A/PROM to be at least 60*, will be able to reach L5 FIR level functionally           GOAL STATUS: Initial    4. Will be more aware of posture with all functional tasks with use of ergonomic aides PRN/as desired      GOAL STATUS: Initial   5. Edema and sensory impairments in R UE to have resolved GOAL STATUS: Initial    LONG TERM GOALS: Target date: 10/22/2024    MMT to have improved by at least one grade in all weak groups       GOAL STATUS: Initial    2. AROM to have normalized and will be pain free all planes of motion      GOAL STATUS: Initial  3. Pain to be no more than 2/10 with all functional tasks     GOAL STATUS: Initial   4. Will be able to perform all functional household and work based tasks without increase from resting pain levels     GOAL STATUS: Initial   5. PSFS to have improved by at least 3 points to show improved QOL and  subjective perception of condition      GOAL STATUS: Initial   6. Will be able to perform all dressing activities, including doing belt and tucking his shirt in behind him without difficulty  GOAL STATUS: Initial   PLAN:  PT FREQUENCY: 2x/week  PT DURATION: 12 weeks  PLANNED INTERVENTIONS: [id number]- PT Re-evaluation, 97750- Physical Performance Testing, 97110-Therapeutic exercises, 97530- Therapeutic activity, [id number]- Neuromuscular re-education, 97535- Self Care, [id number]- Manual therapy, [id number]- Aquatic Therapy, [id number]- Electrical stimulation (unattended), 97016- Vasopneumatic device, [id number]- Ultrasound, and 97033- Ionotophoresis 4mg /ml Dexamethasone   PLAN FOR NEXT SESSION: per reverse TSA protocol (from Dr. Melita, did not have formal protocol so used [facility] protocol below); keep in mind long head of biceps was tenodesed   [email or web address]  Asberry Rodes, PTA  09/01/24 2:36 PM   "

## 2024-09-05 ENCOUNTER — Encounter (HOSPITAL_COMMUNITY): Payer: Self-pay | Admitting: Licensed Clinical Social Worker

## 2024-09-05 NOTE — Progress Notes (Unsigned)
 Virtual virtual Video Note  I connected with Alex [name] on 08/24/24 at  2--:3pm EST by video-enabled virtual visit. I verified that I am speaking with the correct person using  two identifiers.I discussed the limitations of evaluation and management by telemedicine and the availability of in person appointments. The patient expressed understanding and agreed to proceed.    LOCATION: Patient: Home  Provider: Home office   History of Present Illness:  Pt was referred by Dr. Arfeen for OP therapy for bipolar disorder and anxiety.  Treatment Goal Addressed:  Pt will meet with clinician weekly for therapy to monitor for progress towards goals and address any barriers to success; Reduce depression from average severity level of 6/10 down to a 4/10 in next 6 months by engaging in 1-2 positive coping skills daily as part of developing self-care routine; Reduce average anxiety level from 7/10 down to 5/10 in next 6 months by utilizing 1-2 relaxation skills/grounding skills per day, such as mindful breathing, progressive muscle relaxation, positive visualizations.  Progress towards Treatment Goal: Progressing   Observations/Objective: Patient presented for todays session on time and was alert, oriented x5, with no evidence or self-report of SI/HI or A/V H.  Patient reported ongoing compliance with medication and denied any use of alcohol  or illicit substances. Clinician inquired about patients current emotional ratings, as well as any significant changes in thoughts, feelings or behavior since previous session. Patient reported scores of  7/10 for depression, 7/10 for anxiety, 3/10 for anger/irritability. [name] and pt explored his emotional ratings, and discussed coping skills for his stressors. Pt identifies stressors and  allowed pt to explore and express thoughts and feelings associated with recent life situations and external stressors. Pt reports, I continue healing from shoulder reversal surgery  and slowly mending. I'm still in the same amount of pain as I was post surgery. I continue having to maneuver mostly on my own with little help from my family. [name] asked open ended questions. I sam going to address the return of the prostate cancer after I'm healed from should surgery, appt scheduled for January.. Pt discussed his fears and concerns of the new cancer detection.[name] and pt reviewed his continued stressors: marital issues, unfair division of martial finances, son's mental health, physical health, future, pt's sexual health after prostate cancer,new prostate cancer dx, shoulder surgery recovery, lack of marital communication,recovering from shoulder replacement surgery. [name] asked open ended questions. Clinician utilized MI OARS to reflect and summarize thoughts and feelings.    Assessment and plan: Counselor will continue to meet with patient to address treatment plan goals. Patient will continue to follow recommendations of providers and imnd implement skills learned in session, and practice between sessions. Diagnosis: Bipolar 1 disorder.    Collaboration of care:   Continue working with providers    Patient/Guardian was advised Release of Information must be obtained prior to any record release in order to collaborate their care with an outside provider. Patient/Guardian was advised if they have not already done so to contact the registration department to sign all necessary forms in order for us  to release information regarding their care.    Consent: Patient/Guardian gives verbal consent for treatment and assignment of benefits for services provided during this visit. Patient/Guardian expressed understanding and agreed to proceed.     Follow Up Instructions:  I discussed the assessment and treatment plan with the patient. The patient was provided an opportunity to ask questions and all were answered. The patient agreed with the plan  and demonstrated an understanding of the  instructions.   The patient was advised to call back or seek an in-person evaluation if the symptoms worsen or if the condition fails to improve as anticipated.  I provided 60 minutes of non-face-to-face time during this encounter.   Laynie Espy S, LCAS-A 08/24/24

## 2024-09-07 ENCOUNTER — Ambulatory Visit (INDEPENDENT_AMBULATORY_CARE_PROVIDER_SITE_OTHER): Admitting: Licensed Clinical Social Worker

## 2024-09-07 DIAGNOSIS — F319 Bipolar disorder, unspecified: Secondary | ICD-10-CM

## 2024-09-09 ENCOUNTER — Ambulatory Visit (HOSPITAL_BASED_OUTPATIENT_CLINIC_OR_DEPARTMENT_OTHER): Admitting: Physical Therapy

## 2024-09-10 ENCOUNTER — Encounter (HOSPITAL_COMMUNITY): Payer: Self-pay | Admitting: Licensed Clinical Social Worker

## 2024-09-10 NOTE — Progress Notes (Signed)
 Virtual virtual Video Note  I connected with Alex [name] on 09/07/24 at  2--:3pm EST by video-enabled virtual visit. I verified that I am speaking with the correct person using  two identifiers.I discussed the limitations of evaluation and management by telemedicine and the availability of in person appointments. The patient expressed understanding and agreed to proceed.    LOCATION: Patient: Home  Provider: Home office   History of Present Illness:  Pt was referred by Dr. Arfeen for OP therapy for bipolar disorder and anxiety.  Treatment Goal Addressed:  Pt will meet with clinician weekly for therapy to monitor for progress towards goals and address any barriers to success; Reduce depression from average severity level of 6/10 down to a 4/10 in next 6 months by engaging in 1-2 positive coping skills daily as part of developing self-care routine; Reduce average anxiety level from 7/10 down to 5/10 in next 6 months by utilizing 1-2 relaxation skills/grounding skills per day, such as mindful breathing, progressive muscle relaxation, positive visualizations.  Progress towards Treatment Goal: Progressing   Observations/Objective: Patient presented for todays session on time and was alert, oriented x5, with no evidence or self-report of SI/HI or A/V H.  Patient reported ongoing compliance with medication and denied any use of alcohol  or illicit substances. Clinician inquired about patients current emotional ratings, as well as any significant changes in thoughts, feelings or behavior since previous session. Patient reported scores of  7/10 for depression, 7/10 for anxiety, 4/10 for anger/irritability. [name] and pt explored his emotional ratings, and discussed coping skills for his stressors. Pt identifies stressors and  allowed pt to explore and express thoughts and feelings associated with recent life situations and external stressors. Pt reports, I continue healing from shoulder reversal surgery  and slowly mending continuing PT. My pain is subsiding somewhat especially with PT. [name] asked open ended questions. I am going to address the return of the prostate cancer after I'm healed from shoulder surgery.. Pt discussed his fears and concerns of the new cancer detection.[name] and pt reviewed his continued stressors: marital issues, unfair division of martial finances, son's mental health, physical health, future, pt's sexual health after prostate cancer,new prostate cancer dx, shoulder surgery recovery, lack of marital communication,recovering from shoulder replacement surgery, having PT. Clinician used CBT to assist pt with risks and benefits of prostate removal if necessary including fears.        Assessment and plan: Counselor will continue to meet with patient to address treatment plan goals. Patient will continue to follow recommendations of providers and imnd implement skills learned in session, and practice between sessions. Diagnosis: Bipolar 1 disorder.    Collaboration of care:   Continue working with providers    Patient/Guardian was advised Release of Information must be obtained prior to any record release in order to collaborate their care with an outside provider. Patient/Guardian was advised if they have not already done so to contact the registration department to sign all necessary forms in order for us  to release information regarding their care.    Consent: Patient/Guardian gives verbal consent for treatment and assignment of benefits for services provided during this visit. Patient/Guardian expressed understanding and agreed to proceed.     Follow Up Instructions:  I discussed the assessment and treatment plan with the patient. The patient was provided an opportunity to ask questions and all were answered. The patient agreed with the plan and demonstrated an understanding of the instructions.   The patient was advised to call  back or seek an in-person evaluation  if the symptoms worsen or if the condition fails to improve as anticipated.  I provided 60 minutes of non-face-to-face time during this encounter.   Morning Halberg S, LCAS-A 09/07/24

## 2024-09-14 ENCOUNTER — Encounter (HOSPITAL_BASED_OUTPATIENT_CLINIC_OR_DEPARTMENT_OTHER): Payer: Self-pay

## 2024-09-14 ENCOUNTER — Ambulatory Visit (HOSPITAL_BASED_OUTPATIENT_CLINIC_OR_DEPARTMENT_OTHER): Attending: Orthopedic Surgery

## 2024-09-14 DIAGNOSIS — M25511 Pain in right shoulder: Secondary | ICD-10-CM

## 2024-09-14 DIAGNOSIS — M25611 Stiffness of right shoulder, not elsewhere classified: Secondary | ICD-10-CM

## 2024-09-14 DIAGNOSIS — M6281 Muscle weakness (generalized): Secondary | ICD-10-CM

## 2024-09-15 ENCOUNTER — Ambulatory Visit (HOSPITAL_COMMUNITY): Admitting: Licensed Clinical Social Worker

## 2024-09-16 ENCOUNTER — Ambulatory Visit (HOSPITAL_BASED_OUTPATIENT_CLINIC_OR_DEPARTMENT_OTHER): Admitting: Physical Therapy

## 2024-09-16 ENCOUNTER — Encounter (HOSPITAL_BASED_OUTPATIENT_CLINIC_OR_DEPARTMENT_OTHER): Payer: Self-pay | Admitting: Physical Therapy

## 2024-09-16 DIAGNOSIS — M25611 Stiffness of right shoulder, not elsewhere classified: Secondary | ICD-10-CM

## 2024-09-16 DIAGNOSIS — M6281 Muscle weakness (generalized): Secondary | ICD-10-CM

## 2024-09-16 DIAGNOSIS — M25511 Pain in right shoulder: Secondary | ICD-10-CM

## 2024-09-16 NOTE — Therapy (Signed)
 " OUTPATIENT PHYSICAL THERAPY SHOULDER  TREATMENT   Patient Name: BURTON GAHAN MRN: [id number] DOB:[birth year], 69 y.o., male Today's Date: 09/16/2024  END OF SESSION:  PT End of Session - 09/16/24 1435     Visit Number 6    Number of Visits 25    Date for Recertification  10/22/24    Authorization Type [employer]    PT Start Time 1430    PT Stop Time 1515    PT Time Calculation (min) 45 min    Activity Tolerance Patient tolerated treatment well    Behavior During Therapy [facility] for tasks assessed/performed             Past Medical History:  Diagnosis Date   Anemia    Anxiety    Atypical nevus 04/12/1997   dyplastic-left chest below nipple   Atypical nevus 01/18/2005   slight-mod-mid upper abd, slight-mod-right lateral chest-(WS), slight-mod-mid lower back (punch)   Atypical nevus 05/31/2005   dysplastic-central lowerback (exc), dysplastic- right abdomen (Exc)   Basal cell carcinoma 06/04/2016   back of neck   Bipolar I disorder (HCC)    Bleeding ulcer 2016   BPH (benign prostatic hypertrophy) with urinary obstruction    Cancer (HCC)    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 Maude Halt, oncology. Receives monthly IVIG 40 grams.   Depression    Epicondylitis    right elbow   GERD (gastroesophageal reflux disease)    Glaucoma BOTH EYES   RIGHT EYE RADIATION DAMAGE   Hearing loss    Bilateral   Hepatic cyst    Several, The lesion of concern in segment 6 of the  liver has single large portal vein and hepatic vein branches extending to tt, in a pattern of enhancement which mirrors these vascular structures. 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 9 CM X 4. 3 CM PER NOV 06-14-17  CHEST CTFOLOWED BY DR PIETRO RAVEN FOR  Positional vertigo    HX OF WITH SINUS INFECTIONS   Prostate cancer [facility])    Follows w/ Dr. Debby Polascik @ [facility].   Radial head fracture    Right   Squamous cell carcinoma of skin 06/04/2016   in situ-crown of scalp   Squamous cell carcinoma of skin 04/22/2017   in situ-crown scalp (txpbx)   Thoracic aortic aneurysm 06/14/2017   last CT 4.1 CM Mild   Tinnitus    CONSTANT   Ulnar nerve compression    right elbow   Unsteady gait    especially with stairs, depth perception off   Urinary hesitancy    Wears glasses    Past Surgical History:  Procedure Laterality Date   CARDIAC CATHETERIZATION  01-16-2006  DR DEBBY WALL   MILD CORONARY ATHEROSCLEROSIS/ MID TO DISTAL LAD 40% STENOSIS/ LVF 50-55%   CARPAL TUNNEL RELEASE Right 11/03/2017   Procedure: RIGHT HAND CARPAL TUNNEL RELEASE;  Surgeon: Shari Easter, MD;  Location: [facility];  Service: Orthopedics;  Laterality: Right;   CATARACT EXTRACTION Right    COLONSCOPY  2017 LAST DONE   MULTIPLE   CYST EXCISION Right 09/04/2023   Procedure: EXCISION CYST RIGHT SHOULDER;  Surgeon: Eletha Boas, MD;  Location: [facility];  Service: General;  Laterality: Right;  LOCAL & MAC   ENDOSCOPY  LAST 2017   MULTIPLE DONE DILATION DONE ALSO   ESOPHAGOGASTRODUODENOSCOPY (EGD) WITH PROPOFOL  N/A 02/24/2018   Procedure: ESOPHAGOGASTRODUODENOSCOPY (EGD) WITH PROPOFOL ;  Surgeon: Celestia Agent, MD;  Location: [facility];  Service: Endoscopy;  Laterality: N/A;   EXCISION RADIAL HEAD Right 11/03/2017   Procedure: RIGHT PROXIMAL RADIUS RADIAL HEAD RESECTION AND JOINT DEBRIDEMENT;  Surgeon: Shari Easter, MD;  Location: [facility] [place];  Service: Orthopedics;  Laterality: Right;   EXTRACORPOREAL SHOCK WAVE LITHOTRIPSY Right 11/20/2020   Procedure: EXTRACORPOREAL SHOCK WAVE LITHOTRIPSY (ESWL);  Surgeon: Alvaro Hummer, MD;  Location: [facility];  Service: Urology;  Laterality: Right;  75 MINS   KNEE ARTHROSCOPY  05/01/2012   Procedure: ARTHROSCOPY KNEE;  Surgeon: Reyes JAYSON Billing, MD;  Location: [facility];  Service: Orthopedics;  Laterality: Left;  debridement and removal of loose body   LEFT ANKLE ARTHROSCOPY W/ DEBRIDEMENT  05/12/2007   LEFT HYDROCELECTOMY  03/29/2005   AND REPAIR LEFT INGUINAL HERNIA W/ MESH   MOHS SURGERY  2002   RIGHT ORBITAL CANCER   NASAL ENDOSCOPY  08/07/2005   RIGHT EPISTAXIS  / POST SEPTOPLASTY  (HX RIGHT ORBITAL CA & S/P RADIATION/ NECROSIS ANTERIOR END OF BOTH INFERIOR TURBINATES)   occuloplastic surgery  2002   PARS PLANA VITRECTOMY  11/06/2004   RIGHT EYE RADIATION RETINOPATHY W/ HEMORRHAGE   PROSTATE ABLATION N/A 06/2022   RADIAL HEAD ARTHROPLASTY Right 06/15/2018   Procedure: RIGHT ELBOW PROXIMAL RADIOULNAR JOINT DEBRIDEMENT AND ARTHROPLASTY;  Surgeon:  Shari Easter, MD;  Location: [facility] [place];  Service: Orthopedics;  Laterality: Right;  BLOCK WITH SEDATION   REPAIR UNDESENDED RIGHT TESTICLE / RIGHT INGUINAL HERNIA  AGE 2   REVERSE SHOULDER ARTHROPLASTY Right 07/01/2024   Procedure: ARTHROPLASTY, SHOULDER, TOTAL, REVERSE;  Surgeon: Melita Drivers, MD;  Location: [facility];  Service: Orthopedics;  Laterality: Right;    RIGHT ANKLE ARTHROSCOPY W/ EXTENSIVE DEBRIDEMENT  04/05/2008   x2   RIGHT SHOULDER SURGERY  2006   RIGHT SUPRAOMOHYOID NECK DISSECTION   03/08/2003   ZONES 1,2,3;   SUBMANDIBULAR MASS / METASTATIC SQUAMOUS CELL CARCINOMA RIGHT  NECK   SAVORY DILATION N/A 02/24/2018   Procedure: SAVORY DILATION;  Surgeon: Celestia Agent, MD;  Location: [facility];  Service: Endoscopy;  Laterality: N/A;   SEPTOPLASTY  06/2005   SHOULDER ARTHROSCOPY Left    SHOULDER ARTHROSCOPY W/ SUBACROMIAL DECOMPRESSION AND DISTAL CLAVICLE EXCISION  10/09/2008   AND DEBRIDEMENT OF RIGHT SHOULDER IMPINGEMENT & AC JOINT ARTHRITIS   SPINE SURGERY  2016   l 3 TO l 4 PLATE AND SCREWS   TOTAL THYROIDECTOMY  11/03/2001   PAPILLARY THYROID  CARCINOMA   TRANSTHORACIC ECHOCARDIOGRAM  07/2011   grade I diastolic dysfunction/ ef 55-60%   ULNAR NERVE TRANSPOSITION Right 04/28/2014   Procedure: RIGHT ELBOW ULNA NERVE RELEASE TRANSPOSTION AND MEDIAL EPICONDYLAR DEBRIDEMENT AND REPAIR;  Surgeon: Prentice LELON Pagan, MD;  Location: [facility];  Service: Orthopedics;  Laterality: Right;   Patient Active Problem List   Diagnosis Date Noted   Shoulder arthritis 06/23/2024   Bladder neck obstruction 06/23/2024   Esophageal spasm 04/01/2024   GERD (gastroesophageal reflux disease) 04/01/2024   Memory changes 04/01/2024   Primary osteoarthritis of both knees 09/22/2023   Boil 08/26/2023   Abscess 08/20/2023   Cellulitis 05/12/2023   Bruise of lower lip 04/17/2023   Wheezing 03/31/2023   Injury of medial collateral ligament (MCL) of knee  02/07/2023   Sprain of medial collateral ligament of right knee 02/07/2023   Osteoarthritis of subtalar joint 09/25/2022   Cystoid macular edema of right eye 07/18/2022   Hyperglycemia 07/18/2022   Nonexudative age-related macular degeneration, left eye, early dry stage 07/18/2022   Retinal edema 07/18/2022   Superficial punctate keratitis of right eye 07/18/2022   Grief at loss of child 05/22/2022   Actinic keratoses 05/22/2022   Pain in joint of left elbow 04/04/2022   Abnormal defecation 02/26/2022   Atrophic gastritis 02/26/2022   Bipolar 1 disorder (HCC) 02/26/2022   BPH (benign prostatic hyperplasia) 02/26/2022   Bronchiolectasis (HCC) 02/26/2022   Disease of thyroid  gland 02/26/2022   Diverticular disease of colon 02/26/2022   Family history of malignant neoplasm of digestive organs 02/26/2022   Hyperlipidemia 02/26/2022   IgG deficiency (HCC) 02/26/2022   Immune deficiency disorder 02/26/2022   Irritable bowel syndrome 02/26/2022   Pancreatic cyst 02/26/2022   History of colonic polyps 02/26/2022   Portosystemic shunt, 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/26/2022   Pyloric ulcer 02/26/2022   Scoliosis 02/26/2022   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   Spinal stenosis of lumbar region 02/26/2022   Prostate cancer (HCC) 02/26/2022   Dysuria 10/03/2021   Earache 06/14/2021   Anemia 06/14/2021   History of head and neck cancer 03/28/2021   Swelling of right parotid gland 03/28/2021   Epistaxis, recurrent 06/26/2020   PMR (polymyalgia rheumatica) 02/22/2020   Diverticulitis 01/25/2020   Abdominal pain 01/25/2020   Iliac vessel injury 11/30/2019   Stress at home 09/14/2019   LLQ abdominal pain 09/06/2019   IPMN (intraductal papillary mucinous neoplasm) 07/19/2019   History of thyroid  cancer 06/03/2019   Abnormal findings on diagnostic imaging of liver 06/03/2019   Epididymitis 03/16/2019   Difficulty urinating 03/16/2019   Pain in right foot  03/12/2019   Hypothyroidism (acquired) 09/29/2018   Moderate persistent asthma with acute exacerbation 09/29/2018   Prediabetes 09/29/2018   RTI (respiratory tract infection) 09/29/2018   ETD (Eustachian tube dysfunction), bilateral 09/02/2018   Sensorineural hearing loss (SNHL) of both ears 07/31/2018   Bruising 11/17/2017   Encounter for other orthopedic aftercare 11/14/2017   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/31/2017   Closed fracture of head of right radius 10/24/2017   Palatal mass 09/16/2017   Sore throat 09/16/2017   Testicular pain, right 08/01/2017   Combined form of age-related cataract, left eye 05/19/2017   Dry eye syndrome of both eyes 05/19/2017   Primary open angle glaucoma (POAG) of left eye, mild stage 05/19/2017   Radiation retinopathy, 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   Secondary glaucoma due to combination mechanisms, right, severe stage 05/19/2017   Fatigue 01/21/2017   Ankle pain, left 01/21/2017   Fall (on) (from) other stairs and steps, initial encounter 11/11/2016   Abrasion of head 11/11/2016   Ascending aortic aneurysm 07/23/2016   Erectile dysfunction 07/19/2016   Bronchiectasis (HCC) 04/23/2016   Chronic bronchitis (HCC) 03/11/2016   Cerumen impaction 08/04/2015   Bilateral impacted cerumen 08/04/2015   Cervical disc disorder with radiculopathy of cervical region 06/06/2014   Elevated WBC count 06/06/2014   Iron  deficiency anemia 06/06/2014   Edema 02/21/2014   Tinnitus 02/21/2014   Well adult exam 05/31/2013   Glaucoma 05/31/2013   RML pneumonia 03/31/2013   Hypertension, uncontrolled 02/25/2013   Anxiety disorder 08/08/2010   Chest pain, atypical 02/08/2010   ESOPHAGEAL STRICTURE 11/30/2009   Osteoarthritis 08/07/2009   Malaise and fatigue 05/14/2008   Coronary atherosclerosis 11/04/2007   Lumbago 11/04/2007   THYROIDECTOMY, HX OF 11/04/2007   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    PCP: Garald Scrape MD   REFERRING  PROVIDER: Melita Drivers MD   REFERRING DIAG: Diagnosis Z96.611 (ICD-10-CM) - Presence of right artificial shoulder joint  THERAPY DIAG:  Muscle weakness (generalized)  Stiffness of right shoulder, not elsewhere classified  Acute pain of right shoulder  Rationale for Evaluation and Treatment: Rehabilitation  ONSET DATE: Reverse TSA 07/01/24  SUBJECTIVE:                                                                                                                                                                                      SUBJECTIVE STATEMENT:  11 weeks post op. A little soreness/slight sprain feeling across shoulders after fall.   Hand dominance: Right  PERTINENT HISTORY: See above   PAIN:  Are you having pain? Yes: NPRS scale: 3/10 Pain location: R shoulder Pain description: sharp, numbness/tingling going down into forearm  Aggravating factors: IR of UE, reaching for things, pulling covers over himself  Relieving factors: medicine and ice   PRECAUTIONS: Shoulder and Other: poor vision R eye, active CA found recently but not sure if he will go back to chemo or radiation yet   RED FLAGS: None   WEIGHT BEARING RESTRICTIONS: No  FALLS:  Has patient  fallen in last 6 months? Yes. Number of falls 1- fainted at breakfast table   LIVING ENVIRONMENT: Lives with: lives with their family Lives in: House/apartment   OCCUPATION: On disability   PLOF: Independent, Independent with basic ADLs, Independent with gait, and Independent with transfers  PATIENT GOALS:get full use of shoulder and address pain   OBJECTIVE:  Note: Objective measures were completed at Evaluation unless otherwise noted.   PATIENT SURVEYS:  PSFS: THE PATIENT SPECIFIC FUNCTIONAL SCALE  Place score of 0-10 (0 = unable to perform activity and 10 = able to perform activity at the same level as before injury or problem)  Activity Date: 07/30/24 Eval    Dressing (tucking in shirt, buckling  belt, etc) 3    2. Reaching behind the back  2    3. Reaching forward for something  4    4.      Total Score 3      Total Score = Sum of activity scores/number of activities  Minimally Detectable Change: 3 points (for single activity); 2 points (for average score)  [facility] (nd). The Patient Specific Functional Scale . Retrieved from Skateoasis.com.[email or web address]   COGNITION: Overall cognitive status: Within functional limits for tasks assessed     SENSATION: Numbness medial forearm   POSTURE: Rounded shoulders, forward head   UPPER EXTREMITY ROM:    ROM Right eval Right AROM 2/5  Shoulder flexion 110* PROM, AAROM 140* 130 standing  Shoulder extension    Shoulder abduction 90* PROM 120 standing  Shoulder adduction    Shoulder internal rotation    Shoulder external rotation 30* PROM directly by his side  50  Elbow flexion    Elbow extension    Wrist flexion    Wrist extension    Wrist ulnar deviation    Wrist radial deviation    Wrist pronation    Wrist supination    (Blank rows = not tested)  UPPER EXTREMITY MMT:  MMT Right eval Left eval  Shoulder flexion    Shoulder extension    Shoulder abduction    Shoulder adduction    Shoulder internal rotation    Shoulder external rotation    Middle trapezius    Lower trapezius    Elbow flexion    Elbow extension    Wrist flexion    Wrist extension    Wrist ulnar deviation    Wrist radial deviation    Wrist pronation    Wrist supination    Grip strength (lbs)    (Blank rows = not tested)  MMT not done at eval                                                                                                                                TREATMENT DATE:   09/16/24: Discussed subscap repair Supine slow shoulder flexion and return Narrow chest press Sidelying ER Standing AROM against gravity- flexion and abd STM  Rt upper trap, levator  scap   1/21: PROM STM to late/teres in s/l S/l abduction with cues for scap control Prone row (partial with cues for [facility] of scapula) Prone extension 2x10  Prone horizontal abd (partial with assist) cues for scap control x10 Supine wand flexion 2# 2x10 Supine rhythmic stabilization with manual perturbations 2x30sec Seated scapular depression isometrics with yoga block 5 x10     1/14: PROM STM to biceps, periscap mm, post deltoid Supine cane flexion x10 Supine active flexion x10 Sidelying short arc abd x10 (felt better after STM) Seated scap squeeze 3 x10 Pulleys each flexion and scaption Isometrics flex/abd/ext 5 x10ea HEP update/review  Treatment                            08/19/24: Blank lines following charge title = not provided on this treatment date.   Manual:  TPDN No STM upper traps, levator Passive flexion STM lats There-ex: Supine shoulder flexion with deep breathing Seated scapular retraction There-Act: Discussed functional activities such as undressing, resting positions, sleeping Self Care:  Nuro-Re-ed:  Gait Training:     PATIENT EDUCATION: Education details: exam findings, POC, HEP, general course of care and expectations for rehab for reverse TSA, wrist exercises to be performed in elevation to help with edema  Person educated: Patient Education method: Explanation, Demonstration, and Handouts Education comprehension: verbalized understanding, returned demonstration, and needs further education  HOME EXERCISE PROGRAM: Access Code: [email or web address] URL: [email or web address] Date: 07/30/2024 Prepared by: Josette Rough    ASSESSMENT:  CLINICAL IMPRESSION: Pt has made significant improvement in AROM, was pushing ER into posterior irritation and discussed motion limitations due to shoulder shape. Is prepared to begin with light weights for functional lifting.   OBJECTIVE IMPAIRMENTS: decreased ROM, decreased strength,  hypomobility, increased edema, increased fascial restrictions, increased muscle spasms, impaired flexibility, impaired sensation, impaired UE functional use, improper body mechanics, postural dysfunction, and pain.   ACTIVITY LIMITATIONS: carrying, lifting, bathing, toileting, dressing, self feeding, reach over head, hygiene/grooming, and caring for others  PARTICIPATION LIMITATIONS: meal prep, cleaning, laundry, driving, shopping, community activity, and yard work  PERSONAL FACTORS: Age, Behavior pattern, Fitness, Past/current experiences, Social background, and Time since onset of injury/illness/exacerbation are also affecting patient's functional outcome.   REHAB POTENTIAL: Good  CLINICAL DECISION MAKING: Evolving/moderate complexity  EVALUATION COMPLEXITY: Moderate   GOALS: Goals reviewed with patient? No  SHORT TERM GOALS: Target date: 09/10/2024    Patient will be compliant with appropriate progressive HEP        GOAL STATUS: MET  2. R shoulder flexion and ABD A/PROM to be at least 160*         GOAL STATUS: partially met, see obj    3. R shoulder ER A/PROM to be at least 60*, will be able to reach L5 FIR level functionally           GOAL STATUS: ongoing- see obj    4. Will be more aware of posture with all functional tasks with use of ergonomic aides PRN/as desired      GOAL STATUS: MET   5. Edema and sensory impairments in R UE to have resolved GOAL STATUS: ongoing- some numbness medial elbow and mid palm    LONG TERM GOALS: Target date: 10/22/2024    MMT to have improved by at least one grade in all weak groups       GOAL STATUS: Initial    2. AROM to  have normalized and will be pain free all planes of motion      GOAL STATUS: Initial    3. Pain to be no more than 2/10 with all functional tasks     GOAL STATUS: Initial   4. Will be able to perform all functional household and work based tasks without increase from resting pain levels     GOAL STATUS:  Initial   5. PSFS to have improved by at least 3 points to show improved QOL and subjective perception of condition      GOAL STATUS: Initial   6. Will be able to perform all dressing activities, including doing belt and tucking his shirt in behind him without difficulty  GOAL STATUS: Initial   PLAN:  PT FREQUENCY: 2x/week  PT DURATION: 12 weeks  PLANNED INTERVENTIONS: [id number]- PT Re-evaluation, 97750- Physical Performance Testing, 97110-Therapeutic exercises, 97530- Therapeutic activity, [id number]- Neuromuscular re-education, 97535- Self Care, [id number]- Manual therapy, [id number]- Aquatic Therapy, [id number]- Electrical stimulation (unattended), 97016- Vasopneumatic device, [id number]- Ultrasound, and 97033- Ionotophoresis 4mg /ml Dexamethasone   PLAN FOR NEXT SESSION: per reverse TSA protocol (from Dr. Melita, did not have formal protocol so used [facility] protocol below); keep in mind long head of biceps was tenodesed & subscap repaired  [email or web address]  Harlene BROCKS. Kinlee Garrison PT, DPT 09/16/24 3:48 PM    "

## 2024-09-21 ENCOUNTER — Encounter (HOSPITAL_BASED_OUTPATIENT_CLINIC_OR_DEPARTMENT_OTHER)

## 2024-09-21 ENCOUNTER — Ambulatory Visit (HOSPITAL_COMMUNITY): Admitting: Licensed Clinical Social Worker

## 2024-09-23 ENCOUNTER — Encounter (HOSPITAL_BASED_OUTPATIENT_CLINIC_OR_DEPARTMENT_OTHER)

## 2024-09-28 ENCOUNTER — Encounter (HOSPITAL_BASED_OUTPATIENT_CLINIC_OR_DEPARTMENT_OTHER)

## 2024-09-29 ENCOUNTER — Ambulatory Visit (HOSPITAL_COMMUNITY): Admitting: Licensed Clinical Social Worker

## 2024-09-30 ENCOUNTER — Encounter (HOSPITAL_BASED_OUTPATIENT_CLINIC_OR_DEPARTMENT_OTHER): Admitting: Physical Therapy

## 2024-10-05 ENCOUNTER — Encounter (HOSPITAL_BASED_OUTPATIENT_CLINIC_OR_DEPARTMENT_OTHER)

## 2024-10-07 ENCOUNTER — Encounter (HOSPITAL_BASED_OUTPATIENT_CLINIC_OR_DEPARTMENT_OTHER)

## 2024-11-16 ENCOUNTER — Telehealth (HOSPITAL_COMMUNITY): Admitting: Psychiatry

## 2025-02-24 ENCOUNTER — Inpatient Hospital Stay

## 2025-02-24 ENCOUNTER — Inpatient Hospital Stay: Admitting: Hematology & Oncology
